# Patient Record
Sex: Female | Born: 1940
Health system: Southern US, Community
[De-identification: ages and names within clinical notes are randomized; demographics above are authoritative.]

## PROBLEM LIST (undated history)

## (undated) DIAGNOSIS — I10 Essential (primary) hypertension: Secondary | ICD-10-CM

## (undated) DIAGNOSIS — R21 Rash and other nonspecific skin eruption: Secondary | ICD-10-CM

## (undated) DIAGNOSIS — N3281 Overactive bladder: Secondary | ICD-10-CM

## (undated) DIAGNOSIS — J42 Unspecified chronic bronchitis: Secondary | ICD-10-CM

## (undated) DIAGNOSIS — K8020 Calculus of gallbladder without cholecystitis without obstruction: Secondary | ICD-10-CM

## (undated) DIAGNOSIS — Z973 Presence of spectacles and contact lenses: Secondary | ICD-10-CM

## (undated) DIAGNOSIS — D492 Neoplasm of unspecified behavior of bone, soft tissue, and skin: Secondary | ICD-10-CM

## (undated) DIAGNOSIS — H5210 Myopia, unspecified eye: Secondary | ICD-10-CM

## (undated) DIAGNOSIS — Z9289 Personal history of other medical treatment: Secondary | ICD-10-CM

## (undated) DIAGNOSIS — I219 Acute myocardial infarction, unspecified: Secondary | ICD-10-CM

## (undated) DIAGNOSIS — R42 Dizziness and giddiness: Secondary | ICD-10-CM

## (undated) DIAGNOSIS — R0600 Dyspnea, unspecified: Secondary | ICD-10-CM

## (undated) DIAGNOSIS — T7840XA Allergy, unspecified, initial encounter: Secondary | ICD-10-CM

## (undated) DIAGNOSIS — Z972 Presence of dental prosthetic device (complete) (partial): Secondary | ICD-10-CM

## (undated) DIAGNOSIS — R0902 Hypoxemia: Secondary | ICD-10-CM

## (undated) DIAGNOSIS — E669 Obesity, unspecified: Secondary | ICD-10-CM

## (undated) DIAGNOSIS — K08109 Complete loss of teeth, unspecified cause, unspecified class: Secondary | ICD-10-CM

## (undated) DIAGNOSIS — G4734 Idiopathic sleep related nonobstructive alveolar hypoventilation: Secondary | ICD-10-CM

## (undated) DIAGNOSIS — E785 Hyperlipidemia, unspecified: Secondary | ICD-10-CM

## (undated) DIAGNOSIS — R609 Edema, unspecified: Secondary | ICD-10-CM

## (undated) DIAGNOSIS — I2510 Atherosclerotic heart disease of native coronary artery without angina pectoris: Secondary | ICD-10-CM

## (undated) DIAGNOSIS — M1990 Unspecified osteoarthritis, unspecified site: Secondary | ICD-10-CM

## (undated) DIAGNOSIS — K219 Gastro-esophageal reflux disease without esophagitis: Secondary | ICD-10-CM

## (undated) HISTORY — DX: Unspecified osteoarthritis, unspecified site: M19.90

## (undated) HISTORY — PX: COLONOSCOPY: SHX174

## (undated) HISTORY — DX: Hyperlipidemia, unspecified: E78.5

## (undated) HISTORY — DX: Acute myocardial infarction, unspecified: I21.9

## (undated) HISTORY — DX: Calculus of gallbladder without cholecystitis without obstruction: K80.20

## (undated) HISTORY — PX: OTHER SURGICAL HISTORY: SHX169

## (undated) HISTORY — DX: Gastro-esophageal reflux disease without esophagitis: K21.9

## (undated) HISTORY — DX: Obesity, unspecified: E66.9

## (undated) HISTORY — DX: Dizziness and giddiness: R42

## (undated) HISTORY — DX: Allergy, unspecified, initial encounter: T78.40XA

## (undated) HISTORY — DX: Personal history of other medical treatment: Z92.89

## (undated) HISTORY — DX: Neoplasm of unspecified behavior of bone, soft tissue, and skin: D49.2

## (undated) HISTORY — DX: Overactive bladder: N32.81

## (undated) HISTORY — DX: Rash and other nonspecific skin eruption: R21

## (undated) HISTORY — DX: Myopia, unspecified eye: H52.10

## (undated) HISTORY — DX: Atherosclerotic heart disease of native coronary artery without angina pectoris: I25.10

## (undated) HISTORY — DX: Essential (primary) hypertension: I10

---

## 1982-07-27 HISTORY — PX: DILATION AND CURETTAGE OF UTERUS: SHX78

## 1990-07-27 HISTORY — PX: CHOLECYSTECTOMY: SHX55

## 2005-04-03 ENCOUNTER — Emergency Department (HOSPITAL_COMMUNITY): Admission: EM | Admit: 2005-04-03 | Discharge: 2005-04-03 | Payer: Self-pay | Admitting: Family Medicine

## 2005-05-12 ENCOUNTER — Ambulatory Visit: Payer: Self-pay | Admitting: Family Medicine

## 2005-07-22 ENCOUNTER — Encounter: Payer: Self-pay | Admitting: Family Medicine

## 2005-07-22 ENCOUNTER — Other Ambulatory Visit: Admission: RE | Admit: 2005-07-22 | Discharge: 2005-07-22 | Payer: Self-pay | Admitting: Family Medicine

## 2005-07-22 ENCOUNTER — Ambulatory Visit: Payer: Self-pay | Admitting: Family Medicine

## 2005-07-22 LAB — CONVERTED CEMR LAB: Pap Smear: NORMAL

## 2005-07-27 HISTORY — PX: JOINT REPLACEMENT: SHX530

## 2005-08-11 ENCOUNTER — Ambulatory Visit: Payer: Self-pay | Admitting: Family Medicine

## 2005-08-25 ENCOUNTER — Ambulatory Visit: Payer: Self-pay | Admitting: Family Medicine

## 2005-09-21 ENCOUNTER — Ambulatory Visit: Payer: Self-pay | Admitting: Family Medicine

## 2005-09-25 ENCOUNTER — Ambulatory Visit: Payer: Self-pay | Admitting: Family Medicine

## 2005-11-05 ENCOUNTER — Ambulatory Visit: Payer: Self-pay | Admitting: Family Medicine

## 2005-11-12 ENCOUNTER — Ambulatory Visit: Payer: Self-pay | Admitting: Family Medicine

## 2005-12-11 ENCOUNTER — Ambulatory Visit: Payer: Self-pay | Admitting: Family Medicine

## 2006-01-14 ENCOUNTER — Ambulatory Visit (HOSPITAL_COMMUNITY): Admission: RE | Admit: 2006-01-14 | Discharge: 2006-01-14 | Payer: Self-pay | Admitting: Urology

## 2006-02-10 ENCOUNTER — Ambulatory Visit: Payer: Self-pay | Admitting: Family Medicine

## 2006-02-22 ENCOUNTER — Other Ambulatory Visit: Payer: Self-pay

## 2006-03-17 ENCOUNTER — Inpatient Hospital Stay: Payer: Self-pay | Admitting: Specialist

## 2006-04-05 ENCOUNTER — Ambulatory Visit: Payer: Self-pay | Admitting: Family Medicine

## 2006-06-09 ENCOUNTER — Ambulatory Visit: Payer: Self-pay | Admitting: Family Medicine

## 2006-08-10 ENCOUNTER — Ambulatory Visit: Payer: Self-pay | Admitting: Family Medicine

## 2006-08-10 LAB — CONVERTED CEMR LAB
BUN: 14 mg/dL (ref 6–23)
Basophils Relative: 0.2 % (ref 0.0–1.0)
CO2: 27 meq/L (ref 19–32)
Calcium: 9.4 mg/dL (ref 8.4–10.5)
Creatinine, Ser: 0.7 mg/dL (ref 0.4–1.2)
Eosinophils Relative: 2.2 % (ref 0.0–5.0)
Hemoglobin: 13.7 g/dL (ref 12.0–15.0)
Lymphocytes Relative: 17.9 % (ref 12.0–46.0)
MCHC: 33.5 g/dL (ref 30.0–36.0)
MCV: 93.5 fL (ref 78.0–100.0)
Monocytes Relative: 6.9 % (ref 3.0–11.0)
Neutrophils Relative %: 72.8 % (ref 43.0–77.0)
Platelets: 314 10*3/uL (ref 150–400)
Potassium: 3.8 meq/L (ref 3.5–5.1)
RBC: 4.36 M/uL (ref 3.87–5.11)
RDW: 14.1 % (ref 11.5–14.6)
TSH: 1.08 microintl units/mL (ref 0.35–5.50)
Vitamin B-12: 405 pg/mL (ref 211–911)

## 2006-10-15 ENCOUNTER — Encounter: Payer: Self-pay | Admitting: Family Medicine

## 2006-10-15 DIAGNOSIS — J309 Allergic rhinitis, unspecified: Secondary | ICD-10-CM | POA: Insufficient documentation

## 2006-10-15 DIAGNOSIS — N318 Other neuromuscular dysfunction of bladder: Secondary | ICD-10-CM

## 2006-10-15 DIAGNOSIS — E785 Hyperlipidemia, unspecified: Secondary | ICD-10-CM

## 2006-10-15 DIAGNOSIS — E1169 Type 2 diabetes mellitus with other specified complication: Secondary | ICD-10-CM | POA: Insufficient documentation

## 2006-10-15 DIAGNOSIS — M1990 Unspecified osteoarthritis, unspecified site: Secondary | ICD-10-CM | POA: Insufficient documentation

## 2006-10-15 DIAGNOSIS — R32 Unspecified urinary incontinence: Secondary | ICD-10-CM

## 2006-10-15 DIAGNOSIS — J45991 Cough variant asthma: Secondary | ICD-10-CM

## 2006-10-15 DIAGNOSIS — I10 Essential (primary) hypertension: Secondary | ICD-10-CM

## 2007-03-03 ENCOUNTER — Ambulatory Visit: Payer: Self-pay | Admitting: Family Medicine

## 2007-03-03 ENCOUNTER — Telehealth (INDEPENDENT_AMBULATORY_CARE_PROVIDER_SITE_OTHER): Payer: Self-pay | Admitting: *Deleted

## 2007-03-03 DIAGNOSIS — H5210 Myopia, unspecified eye: Secondary | ICD-10-CM

## 2007-03-04 LAB — CONVERTED CEMR LAB
ALT: 21 units/L (ref 0–35)
HDL: 58 mg/dL (ref >39.0)
VLDL: 29 mg/dL (ref 0–40)

## 2007-04-11 ENCOUNTER — Telehealth (INDEPENDENT_AMBULATORY_CARE_PROVIDER_SITE_OTHER): Payer: Self-pay | Admitting: *Deleted

## 2007-04-11 ENCOUNTER — Ambulatory Visit: Payer: Self-pay | Admitting: Family Medicine

## 2007-04-13 LAB — CONVERTED CEMR LAB
ALT: 17 units/L (ref 0–35)
Direct LDL: 132.2 mg/dL
HDL: 40.8 mg/dL (ref >39.0)
VLDL: 40 mg/dL (ref 0–40)

## 2007-04-18 ENCOUNTER — Encounter: Payer: Self-pay | Admitting: Family Medicine

## 2007-04-26 ENCOUNTER — Encounter: Payer: Self-pay | Admitting: Family Medicine

## 2007-04-27 ENCOUNTER — Telehealth (INDEPENDENT_AMBULATORY_CARE_PROVIDER_SITE_OTHER): Payer: Self-pay | Admitting: *Deleted

## 2007-04-29 ENCOUNTER — Encounter (INDEPENDENT_AMBULATORY_CARE_PROVIDER_SITE_OTHER): Payer: Self-pay | Admitting: *Deleted

## 2007-05-31 ENCOUNTER — Ambulatory Visit: Payer: Self-pay | Admitting: Family Medicine

## 2007-08-13 ENCOUNTER — Encounter: Payer: Self-pay | Admitting: Family Medicine

## 2007-09-01 ENCOUNTER — Ambulatory Visit: Payer: Self-pay | Admitting: Family Medicine

## 2007-09-02 LAB — CONVERTED CEMR LAB
ALT: 18 units/L (ref 0–35)
AST: 16 units/L (ref 0–37)
Glucose, Bld: 98 mg/dL (ref 70–99)
Total CHOL/HDL Ratio: 4.9
Triglycerides: 208 mg/dL (ref 0–149)

## 2007-10-17 ENCOUNTER — Encounter: Payer: Self-pay | Admitting: Family Medicine

## 2008-03-16 ENCOUNTER — Telehealth: Payer: Self-pay | Admitting: Family Medicine

## 2008-03-29 ENCOUNTER — Encounter: Payer: Self-pay | Admitting: Family Medicine

## 2008-04-17 ENCOUNTER — Encounter: Payer: Self-pay | Admitting: Family Medicine

## 2008-04-26 ENCOUNTER — Encounter: Payer: Self-pay | Admitting: Family Medicine

## 2008-05-07 ENCOUNTER — Ambulatory Visit: Payer: Self-pay | Admitting: Family Medicine

## 2008-07-27 HISTORY — PX: TOTAL KNEE ARTHROPLASTY: SHX125

## 2008-08-03 ENCOUNTER — Ambulatory Visit: Payer: Self-pay | Admitting: Family Medicine

## 2008-08-14 ENCOUNTER — Encounter: Payer: Self-pay | Admitting: Family Medicine

## 2008-08-20 ENCOUNTER — Telehealth: Payer: Self-pay | Admitting: Family Medicine

## 2008-12-04 ENCOUNTER — Telehealth: Payer: Self-pay | Admitting: Family Medicine

## 2008-12-04 ENCOUNTER — Encounter: Payer: Self-pay | Admitting: Family Medicine

## 2008-12-11 ENCOUNTER — Encounter: Payer: Self-pay | Admitting: Family Medicine

## 2008-12-18 ENCOUNTER — Telehealth: Payer: Self-pay | Admitting: Family Medicine

## 2009-02-27 ENCOUNTER — Telehealth: Payer: Self-pay | Admitting: Family Medicine

## 2009-03-26 ENCOUNTER — Telehealth: Payer: Self-pay | Admitting: Family Medicine

## 2009-04-09 ENCOUNTER — Encounter: Payer: Self-pay | Admitting: Family Medicine

## 2009-06-10 ENCOUNTER — Ambulatory Visit: Payer: Self-pay | Admitting: Family Medicine

## 2009-07-27 ENCOUNTER — Ambulatory Visit: Payer: Self-pay | Admitting: Cardiology

## 2009-07-27 ENCOUNTER — Inpatient Hospital Stay (HOSPITAL_COMMUNITY): Admission: EM | Admit: 2009-07-27 | Discharge: 2009-07-31 | Payer: Self-pay | Admitting: Emergency Medicine

## 2009-07-27 HISTORY — PX: CORONARY ANGIOPLASTY WITH STENT PLACEMENT: SHX49

## 2009-07-29 ENCOUNTER — Encounter (INDEPENDENT_AMBULATORY_CARE_PROVIDER_SITE_OTHER): Payer: Self-pay | Admitting: Emergency Medicine

## 2009-08-07 DIAGNOSIS — I214 Non-ST elevation (NSTEMI) myocardial infarction: Secondary | ICD-10-CM

## 2009-08-08 ENCOUNTER — Ambulatory Visit: Payer: Self-pay | Admitting: Family Medicine

## 2009-08-19 ENCOUNTER — Ambulatory Visit: Payer: Self-pay | Admitting: Cardiovascular Disease

## 2009-08-19 DIAGNOSIS — I2510 Atherosclerotic heart disease of native coronary artery without angina pectoris: Secondary | ICD-10-CM | POA: Insufficient documentation

## 2009-08-27 ENCOUNTER — Encounter: Payer: Self-pay | Admitting: Cardiovascular Disease

## 2009-08-27 ENCOUNTER — Emergency Department (HOSPITAL_COMMUNITY): Admission: EM | Admit: 2009-08-27 | Discharge: 2009-08-27 | Payer: Self-pay | Admitting: Emergency Medicine

## 2009-09-02 ENCOUNTER — Ambulatory Visit: Payer: Self-pay | Admitting: Cardiovascular Disease

## 2009-09-16 ENCOUNTER — Encounter: Payer: Self-pay | Admitting: Cardiovascular Disease

## 2009-09-18 ENCOUNTER — Ambulatory Visit: Payer: Self-pay | Admitting: Family Medicine

## 2009-09-20 LAB — CONVERTED CEMR LAB
Albumin: 3.9 g/dL (ref 3.5–5.2)
Alkaline Phosphatase: 63 units/L (ref 39–117)
Basophils Absolute: 0 10*3/uL (ref 0.0–0.1)
CO2: 26 meq/L (ref 19–32)
Calcium: 9.8 mg/dL (ref 8.4–10.5)
Eosinophils Absolute: 0 10*3/uL (ref 0.0–0.7)
GFR calc non Af Amer: 58.33 mL/min (ref >60)
HCT: 40.6 % (ref 36.0–46.0)
HDL: 53.9 mg/dL (ref >39.00)
Hemoglobin: 13.6 g/dL (ref 12.0–15.0)
Lymphs Abs: 0.8 10*3/uL (ref 0.7–4.0)
Monocytes Relative: 1.6 % — ABNORMAL LOW (ref 3.0–12.0)
Neutro Abs: 16.8 10*3/uL — ABNORMAL HIGH (ref 1.4–7.7)
Potassium: 4.3 meq/L (ref 3.5–5.1)
RBC: 4.33 M/uL (ref 3.87–5.11)
Sodium: 142 meq/L (ref 135–145)
TSH: 0.59 microintl units/mL (ref 0.35–5.50)
Total CHOL/HDL Ratio: 2
Total Protein: 7.8 g/dL (ref 6.0–8.3)
VLDL: 17 mg/dL (ref 0.0–40.0)

## 2009-09-25 ENCOUNTER — Telehealth: Payer: Self-pay | Admitting: Family Medicine

## 2009-10-09 ENCOUNTER — Encounter: Payer: Self-pay | Admitting: Family Medicine

## 2009-10-10 ENCOUNTER — Ambulatory Visit: Payer: Self-pay | Admitting: Family Medicine

## 2009-10-10 LAB — CONVERTED CEMR LAB
Basophils Absolute: 0.1 10*3/uL (ref 0.0–0.1)
Eosinophils Absolute: 0.4 10*3/uL (ref 0.0–0.7)
Lymphocytes Relative: 18.7 % (ref 12.0–46.0)
MCHC: 33.5 g/dL (ref 30.0–36.0)
MCV: 91.1 fL (ref 78.0–100.0)
Neutro Abs: 7.4 10*3/uL (ref 1.4–7.7)
RDW: 14.8 % — ABNORMAL HIGH (ref 11.5–14.6)
WBC: 10.3 10*3/uL (ref 4.5–10.5)

## 2009-10-15 ENCOUNTER — Ambulatory Visit: Payer: Self-pay | Admitting: Family Medicine

## 2009-10-21 ENCOUNTER — Telehealth: Payer: Self-pay | Admitting: Family Medicine

## 2009-10-29 ENCOUNTER — Telehealth: Payer: Self-pay | Admitting: Family Medicine

## 2009-11-27 ENCOUNTER — Ambulatory Visit: Payer: Self-pay | Admitting: Family Medicine

## 2009-12-03 ENCOUNTER — Telehealth: Payer: Self-pay | Admitting: Cardiovascular Disease

## 2009-12-03 ENCOUNTER — Encounter: Payer: Self-pay | Admitting: Cardiovascular Disease

## 2009-12-05 ENCOUNTER — Encounter: Payer: Self-pay | Admitting: Family Medicine

## 2009-12-05 LAB — HM MAMMOGRAPHY: HM Mammogram: NORMAL

## 2009-12-12 ENCOUNTER — Encounter: Payer: Self-pay | Admitting: Family Medicine

## 2010-01-10 ENCOUNTER — Telehealth: Payer: Self-pay | Admitting: Cardiovascular Disease

## 2010-02-13 ENCOUNTER — Encounter: Payer: Self-pay | Admitting: Family Medicine

## 2010-02-25 ENCOUNTER — Telehealth: Payer: Self-pay | Admitting: Cardiovascular Disease

## 2010-03-18 ENCOUNTER — Ambulatory Visit: Payer: Self-pay | Admitting: Cardiovascular Disease

## 2010-05-05 ENCOUNTER — Telehealth: Payer: Self-pay | Admitting: Family Medicine

## 2010-05-20 ENCOUNTER — Ambulatory Visit: Payer: Self-pay | Admitting: Family Medicine

## 2010-05-20 LAB — CONVERTED CEMR LAB
ALT: 17 units/L (ref 0–35)
AST: 18 units/L (ref 0–37)
Albumin: 4 g/dL (ref 3.5–5.2)
Glucose, Bld: 111 mg/dL — ABNORMAL HIGH (ref 70–99)
HDL: 51.4 mg/dL (ref >39.00)
Phosphorus: 3.6 mg/dL (ref 2.3–4.6)
Potassium: 3.9 meq/L (ref 3.5–5.1)
Sodium: 142 meq/L (ref 135–145)
Total CHOL/HDL Ratio: 3

## 2010-05-27 ENCOUNTER — Ambulatory Visit: Payer: Self-pay | Admitting: Family Medicine

## 2010-05-27 DIAGNOSIS — R7309 Other abnormal glucose: Secondary | ICD-10-CM | POA: Insufficient documentation

## 2010-06-06 ENCOUNTER — Encounter: Payer: Self-pay | Admitting: Cardiovascular Disease

## 2010-06-24 ENCOUNTER — Encounter: Payer: Self-pay | Admitting: Family Medicine

## 2010-06-26 ENCOUNTER — Encounter: Payer: Self-pay | Admitting: Cardiovascular Disease

## 2010-06-26 ENCOUNTER — Telehealth: Payer: Self-pay | Admitting: Cardiovascular Disease

## 2010-08-07 ENCOUNTER — Ambulatory Visit
Admission: RE | Admit: 2010-08-07 | Discharge: 2010-08-07 | Payer: Self-pay | Source: Home / Self Care | Attending: Cardiovascular Disease | Admitting: Cardiovascular Disease

## 2010-08-07 ENCOUNTER — Encounter: Payer: Self-pay | Admitting: Cardiovascular Disease

## 2010-08-07 ENCOUNTER — Telehealth: Payer: Self-pay | Admitting: Cardiovascular Disease

## 2010-08-11 ENCOUNTER — Telehealth (INDEPENDENT_AMBULATORY_CARE_PROVIDER_SITE_OTHER): Payer: Self-pay | Admitting: *Deleted

## 2010-08-11 ENCOUNTER — Ambulatory Visit: Payer: Self-pay | Admitting: Specialist

## 2010-08-17 ENCOUNTER — Encounter: Payer: Self-pay | Admitting: Otolaryngology

## 2010-08-19 ENCOUNTER — Inpatient Hospital Stay: Payer: Self-pay | Admitting: Specialist

## 2010-08-26 NOTE — Assessment & Plan Note (Signed)
Summary: RASH UNDER BREATS/JRR   Vital Signs:  Patient profile:   70 year old female Height:      66 inches Weight:      218.75 pounds BMI:     35.43 Temp:     97.9 degrees F oral Pulse rate:   76 / minute Pulse rhythm:   regular BP sitting:   110 / 68  (left arm) Cuff size:   large  Vitals Entered By: Lewanda Rife LPN (October 15, 2009 9:06 AM) CC: Rash under breast for 1 1/2 weeks   History of Present Illness: has rash under breasts that is itchy- cannot get rid of it almost 2 wk used benadryl cream , zinc oxide   ? anti fungal for jock itch -- ? unsure-- that helped a little with itch   no illness of fever   no rash under arms some rash under belly   Allergies: 1)  ! Lipitor 2)  ! Zocor 3)  ! 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 No CAD  Social History: Last updated: 08/19/2009 She quit tobacco about 20 years ago.  Occasional  alcohol.   Denies illicit drug use.  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 Eyes:  Denies blurring and eye pain. CV:  Denies chest pain or discomfort and palpitations. Resp:  Denies cough and wheezing. MS:  Denies joint pain. Derm:  Complains of itching, lesion(s), and rash; denies poor wound healing. Neuro:  Denies numbness. Heme:  Denies abnormal bruising.  Physical Exam  General:  overweight but generally well appearing  Head:  normocephalic, atraumatic, and no abnormalities observed.   Neck:  No deformities, masses, or tenderness noted. Lungs:  Normal respiratory effort, chest expands symmetrically. Lungs are clear to auscultation, no crackles or wheezes. Heart:  Normal rate and regular rhythm. S1 and S2 normal without gallop, murmur, click, rub or other extra sounds. Abdomen:  no suprapubic tenderness or fullness felt  Skin:  erythematous rash under breasts and under pannus with satellite lesions no skin breakdown resembles yeast  Cervical Nodes:  No lymphadenopathy noted Inguinal Nodes:  No significant adenopathy Psych:  normal affect, talkative and pleasant    Impression & Recommendations:  Problem # 1:  INTERTRIGO, CANDIDAL (ICD-695.89) Assessment New under breasts and pannus  recommended keeping clean and dry - out of pool until healed  nystatin two times a day  then antifungal powder otc as needed -- (wt loss may help ) update 1 week if not imp   Complete Medication List: 1)  Amlodipine Besylate 10 Mg Tabs (Amlodipine besylate) .Marland Kitchen.. 1 by mouth everyday 2)  Bl Vitamin C 1000 Mg Tabs (Ascorbic acid) .... One by mouth everyday 3)  Calcium-vitamin D 500-200 Mg-unit Tabs (Calcium  carbonate-vitamin d) .Marland Kitchen.. 1 tab once daily 4)  Chlortrimaton Otc  .... Take one by mouth two times a day as needed 5)  Ra Flax Seed Oil 1000 1000 Mg Caps (Flaxseed (linseed)) .... One by mouth daily 6)  Nasonex 50 Mcg/act Susp (Mometasone furoate) .... 2 sprays in each nostril once daily 7)  Vesicare 10 Mg Tabs (Solifenacin  succinate) .... Take one by mouth daily 8)  Plavix 75 Mg Tabs (Clopidogrel bisulfate) .... Take one by mouth daily 9)  Pataday 0.2 % Soln (Olopatadine hcl) .... Use one drop both eyes daily 10)  Metoprolol Tartrate 25 Mg Tabs (Metoprolol tartrate) .... Take one by mouth two times a day 11)  Aspirin 325 Mg Tabs (Aspirin) .... Take one by mouth daily 12)  Crestor 20 Mg Tabs (Rosuvastatin calcium) .... Take one by mouth daily 13)  Nitrostat 0.4 Mg Subl (Nitroglycerin) .... Take sl as directed as needed 14)  Nystatin 100000 Unit/gm Crea (Nystatin) .... Apply to affected area two times a day as needed rash - yeast  Patient Instructions: 1)  keep your rash as cool and dry as possible  2)  use nystatin cream two times a day until clear  3)  when clear - you can start using any over the counter antifungal powder to keep it dry  4)  update me in 5-7 days if not improved  Prescriptions: NYSTATIN 100000 UNIT/GM CREA (NYSTATIN) apply to affected area two times a day as needed rash - yeast  #1 medium x 1   Entered and Authorized by:   Judith Part MD   Signed by:   Judith Part MD on 10/15/2009   Method used:   Electronically to        [id number]       [address]       [place], [place]  [address]       Ph: [phone]       Fax: [phone]   RxID:   [id number]   Current Allergies (reviewed today): ! LIPITOR ! ZOCOR ! * FLONASE ASA * MAVIK

## 2010-08-26 NOTE — Assessment & Plan Note (Signed)
Summary: 3 M F/U DLO   Vital Signs:  Patient profile:   70 year old female Height:      66 inches Weight:      221.25 pounds BMI:     35.84 Temp:     98.1 degrees F oral Pulse rate:   76 / minute Pulse rhythm:   regular BP sitting:   120 / 68  (left arm) Cuff size:   large  Vitals Entered By: Lewanda Rife LPN (Nov 28, 7251 11:12 AM) CC: three  month f/u   History of Present Illness: here for f/u 3 mo of HTN and lipids  has been feeling good overall --much better than she had   L knee is still bothering her  has to wait a year before she can have knee done due to plavix  cortisone shot helped and synervisc   wt is up 4 lb with bmi 35  HTN well controlled with bp 120/68 today   Last Lipid ProfileCholesterol: 124 (09/18/2009 9:11:08 AM)HDL:  53.90 (09/18/2009 9:11:08 AM)LDL:  53 (09/18/2009 9:11:08 AM)Triglycerides:  Last Liver profileSGOT:  19 (09/18/2009 9:11:08 AM)SPGT:  18 (09/18/2009 9:11:08 AM)T. Bili:  0.4 (09/18/2009 9:11:08 AM)Alk Phos:  63 (09/18/2009 9:11:08 AM)  good control on crestor -- excellent   [facility]  her husband is going with her   mam - needs to sched no lumps on self exam    rash under breasts- uses lotrisone -- is helpful with powder and lotrisone     Allergies: 1)  ! Lipitor 2)  ! Zocor 3)  ! * Flonase 4)  ! 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 No CAD  Social History: Last updated: 08/19/2009 She quit tobacco about 20 years ago.  Occasional  alcohol.   Denies illicit drug use.  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 Eyes:  Denies blurring and eye irritation. CV:  Denies chest pain or discomfort and lightheadness. Resp:  Denies cough, shortness of breath, and wheezing. GI:  Denies abdominal pain, bloody stools, indigestion, and nausea. MS:  Denies muscle aches. Derm:  Denies itching, lesion(s), poor wound healing, and rash. Neuro:  Denies numbness and tingling. Psych:  mood is ok. Endo:  Denies cold intolerance, excessive thirst, excessive urination, and heat intolerance. Heme:  Denies abnormal bruising, bleeding, enlarge lymph nodes, and fevers.  Physical Exam  General:  obese and well appearing  Head:  normocephalic, atraumatic, and no abnormalities observed.   Eyes:  vision grossly intact, pupils equal, pupils round, and pupils reactive to light.  no conjunctival pallor, injection or icterus  Mouth:  pharynx pink and moist.   Neck:  supple with full rom and no masses or thyromegally, no JVD or  carotid bruit  Chest Wall:  No deformities, masses, or tenderness noted. Lungs:  Normal respiratory effort, chest expands symmetrically. Lungs are clear to auscultation, no crackles or wheezes. Heart:  Normal rate and regular rhythm. S1 and S2 normal without gallop, murmur, click, rub or other extra sounds. Abdomen:  Bowel sounds positive,abdomen soft and non-tender without masses, organomegaly or hernias noted. no  renal bruits  Msk:  No deformity or scoliosis noted of thoracic or lumbar spine.  poor rom knee Pulses:  R and L carotid,radial,femoral,dorsalis pedis and posterior tibial pulses are full and equal bilaterally Extremities:  No clubbing, cyanosis, edema, or deformity noted with normal full range of motion of all joints.   Neurologic:  sensation intact to light touch, gait normal, and DTRs symmetrical and normal.   Skin:  few areas of redness lower abd 1 cm /scaley with central clearing  rash under breasts is almost gone Cervical Nodes:  No lymphadenopathy noted Inguinal Nodes:  No significant adenopathy Psych:  normal affect, talkative and pleasant    Impression & Recommendations:  Problem # 1:  INTERTRIGO, CANDIDAL (ICD-695.89) Assessment Improved better with lotrisone and daily antifungal powder urged to keep areas clean and dry  Problem # 2:  LEUKOCYTOSIS (ICD-288.60) Assessment: Improved this was due to cortisone shot and is resolved  rev last labs with pt   Problem # 3:  HYPERLIPIDEMIA (ICD-272.4) Assessment: Improved  very well controlled with crestor will continue that and good diet lab and f/u 6 mo rev lab in detail today Her updated medication list for this problem includes:    Crestor 20 Mg Tabs (Rosuvastatin calcium) .Marland Kitchen... Take one by mouth daily  Labs Reviewed: SGOT: 19 (09/18/2009)   SGPT: 18 (09/18/2009)   HDL:53.90 (09/18/2009), 40.6 (09/01/2007)  LDL:53 (09/18/2009), DEL (03/00/9233)  Chol:124 (09/18/2009), 197 (09/01/2007)  Trig:85.0 (09/18/2009), 208 (09/01/2007)  Problem # 4:  HYPERTENSION (ICD-401.9) Assessment: Unchanged very good control with current meds - no problems urged to keep up exercise f/u 6 m  Her updated medication list for this problem includes:    Amlodipine Besylate 10 Mg Tabs (Amlodipine besylate) .Marland Kitchen... 1 by mouth everyday    Metoprolol Tartrate 25 Mg Tabs (Metoprolol tartrate) .Marland Kitchen... Take one by mouth two times a day  BP today:  120/68 Prior BP: 110/68 (10/15/2009)  Labs Reviewed: K+: 4.3 (09/18/2009) Creat: : 1.0 (09/18/2009)   Chol: 124 (09/18/2009)   HDL: 53.90 (09/18/2009)   LDL: 53 (09/18/2009)   TG: 85.0 (09/18/2009)  Problem # 5:  OTHER SCREENING MAMMOGRAM (ICD-V76.12) Assessment: Comment Only schedule annual mammogram Orders: Radiology Referral (Radiology)  Complete Medication List: 1)  Amlodipine Besylate 10 Mg Tabs (Amlodipine besylate) .Marland Kitchen.. 1 by mouth everyday 2)  Bl Vitamin C 1000 Mg Tabs (Ascorbic acid) .... One by mouth everyday 3)  Calcium-vitamin D 500-200 Mg-unit Tabs (Calcium carbonate-vitamin d) .Marland Kitchen.. 1 tab once daily 4)  Chlortrimaton Otc  .... Take one by mouth two times a day as needed 5)  Ra Flax Seed Oil 1000 1000 Mg Caps (Flaxseed (linseed)) .... One by mouth daily 6)  Nasonex 50 Mcg/act Susp (Mometasone furoate) .... 2 sprays in each nostril once daily 7)  Vesicare 10 Mg Tabs (Solifenacin succinate) .... Take one by mouth daily 8)  Plavix 75 Mg Tabs (Clopidogrel bisulfate) .... Take one by mouth daily 9)  Pataday 0.2 % Soln (Olopatadine hcl) .... Use one drop both eyes daily 10)  Metoprolol Tartrate 25 Mg Tabs (Metoprolol tartrate) .... Take one by mouth two times  a day 11)  Aspirin 325 Mg Tabs (Aspirin) .... Take one by mouth daily 12)  Crestor 20 Mg Tabs (Rosuvastatin calcium) .... Take one by mouth daily 13)  Nitrostat 0.4 Mg Subl (Nitroglycerin) .... Take sl as directed as needed 14)  Lotrisone 1-0.05 % Crea (Clotrimazole-betamethasone) .... Apply to aff area two times a day  Other Orders: Pneumococcal Vaccine (21308) Admin 1st Vaccine (65784) Admin 1st Vaccine [facility]) [phone])  Patient Instructions: 1)  pneumonia vaccine today  2)  schedule fasting labs and then follow up in 6 months lipid/ast/alt/reanal 272 3)  we will schedule mammogram at check out   Current Allergies (reviewed today): ! LIPITOR ! ZOCOR ! * FLONASE ! * FISH ASA * MAVIK     Pneumovax Vaccine     Vaccine Type: Pneumovax    Site: left deltoid    Mfr: Merck    Dose: 0.5 ml    Route: IM    Given by: Lewanda Rife LPN    Exp. Date: 05/21/2011    Lot #: [id number]    VIS given: 02/22/96 version given Nov 27, 2009.

## 2010-08-26 NOTE — Assessment & Plan Note (Signed)
Summary: eph/pt was seen in er   Visit Type:  EPH Primary Provider:  Judith Part MD  CC:  pt was seen in ED last Tuesday for cp.  History of Present Illness: 70 yo female with h/o HTN, OA and morbid obesity recently admitted to [facility] from 07/27/09 to 07/31/09 with NSTEMI. She had a cardiac cath on 07/29/09 showing severe two vessel disease and we placed Promus drug eluting stents in the LAD and the RCA. She did well folllowing discharge and was seen here in our office two weeks ago. Since then, she had an episode of chest pain and was seen in the ED at [facility] 08/27/09.  She was at the dentist with her husband and had the onset of pressure in her chest that was mild. She took a NTG and then felt dizzy. She then went to the ED where she had normal POC cardiac enzymes. She was not admitted. EKG reportedly without ischemic changes. She is here today for follow up. She has had no recurrence of pain. Overall energy level is down but unchanged. She has been belching alot and thinks she may have some acid reflux.   Current Medications (verified): 1)  Amlodipine Besylate 10 Mg  Tabs (Amlodipine Besylate) .Marland Kitchen.. 1 By Mouth Qd 2)  Bl Vitamin C 1000 Mg  Tabs (Ascorbic Acid) .... One By Mouth Qd 3)  Calcium-Vitamin D 500-200 Mg-Unit Tabs (Calcium Carbonate-Vitamin D) .Marland Kitchen.. 1 Tab Once Daily 4)  Chlortrimaton Otc .... Take One By Mouth Two Times A Day As Needed 5)  Ra Flax Seed Oil 1000 1000 Mg Caps (Flaxseed (Linseed)) .... One By Mouth Daily 6)  Nasonex 50 Mcg/act Susp (Mometasone Furoate) .... 2 Sprays in Each Nostril Once Daily 7)  Vesicare 10 Mg Tabs (Solifenacin Succinate) .... Take One By Mouth Daily 8)  Plavix 75 Mg Tabs (Clopidogrel Bisulfate) .... Take One By Mouth Daily 9)  Pataday 0.2 % Soln (Olopatadine Hcl) .... Use One Drop Both Eyes Daily 10)  Metoprolol Tartrate 25 Mg Tabs (Metoprolol Tartrate) .... Take One By Mouth Two Times A Day 11)  Aspirin 325 Mg Tabs (Aspirin) .... Take One By  Mouth Daily 12)  Crestor 20 Mg Tabs (Rosuvastatin Calcium) .... Take One By Mouth Daily 13)  Nitrostat 0.4 Mg Subl (Nitroglycerin) .... Take Sl As Directed As Needed  Allergies: 1)  ! Lipitor 2)  ! Zocor 3)  ! * Flonase 4)  Asa 5)  * Mavik  Past History:  Past Medical History: Reviewed history from 08/07/2009 and no changes required. 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  The patient denies malaise, fever, weight gain/loss, vision loss, decreased hearing, hoarseness, palpitations, shortness of breath, prolonged cough, wheezing, sleep apnea, coughing up blood, abdominal pain, blood in stool, nausea, vomiting, diarrhea, heartburn, incontinence, blood in urine, muscle weakness, joint pain, leg swelling, rash, skin lesions, headache, fainting, dizziness, depression, anxiety, enlarged lymph nodes, easy bruising or bleeding, and environmental allergies.    Vital Signs:  Patient profile:   70 year old female Height:      66 inches Weight:  222 pounds BMI:     35.96 Pulse rate:   76 / minute Pulse rhythm:   regular BP sitting:   130 / 76  (right arm) Cuff size:   large  Vitals Entered By: Danielle Rankin, CMA (September 02, 2009 11:15 AM)  Physical Exam  General:  General: Well developed, well nourished, NAD Psychiatric: Mood and affect normal Neck: No JVD, no carotid bruits, no thyromegaly, no  lymphadenopathy. Lungs:Clear bilaterally, no wheezes, rhonci, crackles CV: RRR no murmurs, gallops rubs Abdomen: soft, NT, ND, BS present Extremities: No edema, pulses 2+.    EKG  Procedure date:  09/02/2009  Findings:      Sinus rhythm, rate 73 bpm with PACs  Impression & Recommendations:  Problem # 1:  CAD, NATIVE VESSEL (ICD-414.01) Stable. I do not think her recent chest pain was cardiac. Continue current cardiac meds. I have asked her to start Zantac or Pepcid for GERD. She will avoid PPIs since she is on Plavix.   Her updated medication list for this problem includes:    Amlodipine Besylate 10 Mg Tabs (Amlodipine besylate) .Marland Kitchen... 1 by mouth qd    Plavix 75 Mg Tabs (Clopidogrel bisulfate) .Marland Kitchen... Take one by mouth daily    Metoprolol Tartrate 25 Mg Tabs (Metoprolol tartrate) .Marland Kitchen... Take one by mouth two times a day    Aspirin 325 Mg Tabs (Aspirin) .Marland Kitchen... Take one by mouth daily    Nitrostat 0.4 Mg Subl (Nitroglycerin) .Marland Kitchen... Take sl as directed as needed  Problem # 2:  HYPERTENSION (ICD-401.9) Well controlled.  Continue current therapy.   Her updated medication list for this problem includes:    Amlodipine Besylate 10 Mg Tabs (Amlodipine besylate) .Marland Kitchen... 1 by mouth qd    Metoprolol Tartrate 25 Mg Tabs (Metoprolol tartrate) .Marland Kitchen... Take one by mouth two times a day    Aspirin 325 Mg Tabs (Aspirin) .Marland Kitchen... Take one by mouth daily  Patient Instructions: 1)  Your physician recommends that you schedule a follow-up appointment in: 6 months

## 2010-08-26 NOTE — Letter (Signed)
[facility]   Imported By: Roderic Ovens 09/05/2009 13:48:44  _____________________________________________________________________  External Attachment:    Type:   Image     Comment:   External Document

## 2010-08-26 NOTE — Miscellaneous (Signed)
Summary: Orders Update  Clinical Lists Changes  Medications: Changed medication from METOPROLOL TARTRATE 25 MG TABS (METOPROLOL TARTRATE) take one by mouth two times a day to METOPROLOL TARTRATE 25 MG TABS (METOPROLOL TARTRATE) take one by mouth two times a day - Signed Rx of METOPROLOL TARTRATE 25 MG TABS (METOPROLOL TARTRATE) take one by mouth two times a day;  #60 x 6;  Signed;  Entered by: Ollen Gross, RN, BSN;  Authorized by: Verne Carrow, MD;  Method used: Electronically to [facility] [address], [place], [place], [place]  [address], Ph: [phone], Fax: [phone]    Prescriptions: METOPROLOL TARTRATE 25 MG TABS (METOPROLOL TARTRATE) take one by mouth two times a day  #60 x 6   Entered by:   Ollen Gross, RN, BSN   Authorized by:   Verne Carrow, MD   Signed by:   Ollen Gross, RN, BSN on 12/03/2009   Method used:   Electronically to        [facility] [address] (retail)       [address], [address]       [place], [place]  [address]       Ph: [phone]       Fax: [phone]   RxID:   [id number]

## 2010-08-26 NOTE — Miscellaneous (Signed)
Summary: [facility]  [facility]   Imported By: Maryln Gottron 10/14/2009 12:18:23  _____________________________________________________________________  External Attachment:    Type:   Image     Comment:   External Document

## 2010-08-26 NOTE — Miscellaneous (Signed)
Summary: flu vaccine  Clinical Lists Changes  Observations: Added new observation of FLU VAX: Historical received at [facility] (05/15/2010 11:42)      Immunization History:  Influenza Immunization History:    Influenza:  historical received at [facility] (05/15/2010)

## 2010-08-26 NOTE — Progress Notes (Signed)
Summary: wbc elevated   Phone Note Call from Patient Call back at [phone]   Caller: Patient Call For: Judith Part MD Summary of Call: Patient says that when she was in for her labs her wbc was elevated. She wanted to let you know that she thinks the reason is because she had a cortizone shot in her knee the day before the labs. Please advise. Initial call taken by: Melody Comas,  September 25, 2009 11:31 AM  Follow-up for Phone Call        thanks for the info-- that could absolutely play a role please schedule re check of cbc with diff in about 2 weeks for leukocytosis and hopefully it will be down  still- update me if fever or any other symptoms- thanks  Follow-up by: Judith Part MD,  September 25, 2009 11:44 AM  Additional Follow-up for Phone Call Additional follow up Details #1::        Patient notified as instructed by telephone. lab appointment scheduled as instructed  10/10/09 at 8:45am. Lewanda Rife LPN  September 26, 2723 5:15 PM

## 2010-08-26 NOTE — Letter (Signed)
Summary: Gwenlyn Fudge MD  Gwenlyn Fudge MD   Imported By: Lanelle Bal 06/26/2010 13:47:29  _____________________________________________________________________  External Attachment:    Type:   Image     Comment:   External Document

## 2010-08-26 NOTE — Letter (Signed)
Summary: [facility]  [facility]   Imported By: Lanelle Bal 02/19/2010 11:35:30  _____________________________________________________________________  External Attachment:    Type:   Image     Comment:   External Document

## 2010-08-26 NOTE — Letter (Signed)
Summary: Generic Letter  Architectural technologist, Main Office  [address]   [place], [place] [address]   Phone: [phone]  Fax: [phone]    06/26/2010  [facility] [address] [place], [place]  [address]  cc: Gwenlyn Fudge  Dear Dr. Katrinka Blazing,  Ms. Kelli Velasquez had 2 drug eluting coronary stents placed on July 29, 2009. She will require one year of dual antiplatelet therapy with aspirin and Plavix before any elective surgery. I would recommend that her knee surgery be postponed until after July 29, 2010 based on current guidelines. At that time, it should be safe to stop her Plavix but continue her ASA if possible during the period around her knee surgery. Please call me with questions.     Sincerely,   Verne Carrow, MD

## 2010-08-26 NOTE — Miscellaneous (Signed)
Summary: mammogram screening  Clinical Lists Changes  Observations: Added new observation of MAMMO DUE: 11/2010 (12/12/2009 12:58) Added new observation of MAMMOGRAM: normal (12/05/2009 12:58)      Preventive Care Screening  Mammogram:    Date:  12/05/2009    Next Due:  11/2010    Results:  normal

## 2010-08-26 NOTE — Letter (Signed)
Summary: Results Follow up Letter  [place] [facility]  [address] [place], [place] [address]   Phone: [phone]  Fax: [phone]    12/12/2009 MRN: [id number]   [facility] [address] [place], [place]  [address]    Dear Ms. Friend,  The following are the results of your recent test(s):  Test         Result    Pap Smear:        Normal _____  Not Normal _____ Comments: ______________________________________________________ Cholesterol: LDL(Bad cholesterol):         Your goal is less than:         HDL (Good cholesterol):       Your goal is more than: Comments:  ______________________________________________________ Mammogram:        Normal __X___  Not Normal _____ Comments:Please repeat mammogram in one year.  ___________________________________________________________________ Hemoccult:        Normal _____  Not normal _______ Comments:    _____________________________________________________________________ Other Tests:    We routinely do not discuss normal results over the telephone.  If you desire a copy of the results, or you have any questions about this information we can discuss them at your next office visit.   Sincerely,    Idamae Schuller Tower,MD  MT/ri

## 2010-08-26 NOTE — Progress Notes (Signed)
Summary: Pt update rash under breast (worse)  Phone Note Call from Patient Call back at [phone] cell   Caller: Patient Call For: Judith Part MD Summary of Call: Pt saw Dr Milinda Antis 10/15/09 re rash under breast. Pt trying to keep area dry but pt says under breast stays damp even trying to dry with towel and using a fan also.  The rash has spread more under the breast and also a new area on upper chest and on arm. The itching is worse also. Pt using Benadryl cream on the two new areas to help control itching. Pt still using Nystatin. Pt uses [facility] if pharmacy is needed. Please advise.  Initial call taken by: Lewanda Rife LPN,  October 21, 2009 10:01 AM  Follow-up for Phone Call        lets switch to different cream called lotrisone it has different antifungal and also a little cortisone for itch let me know if not imp over the week  keep areas as dry as possible as you are doing px written on EMR for call in  Follow-up by: Judith Part MD,  October 21, 2009 10:11 AM  Additional Follow-up for Phone Call Additional follow up Details #1::        Patient notified as instructed by telephone. Medication [facility]oned to [facility] pharmacy as instructed. Lewanda Rife LPN  October 21, 2009 10:43 AM     New/Updated Medications: LOTRISONE 1-0.05 % CREA (CLOTRIMAZOLE-BETAMETHASONE) apply to aff area two times a day Prescriptions: LOTRISONE 1-0.05 % CREA (CLOTRIMAZOLE-BETAMETHASONE) apply to aff area two times a day  #1 medium x 1   Entered and Authorized by:   Judith Part MD   Signed by:   Lewanda Rife LPN on 56/21/3086   Method used:   Telephoned to ...         RxID:   [id number]

## 2010-08-26 NOTE — Assessment & Plan Note (Signed)
Summary: 6 month follow up/414.01/pla   Visit Type:  6 mo f/u Primary Eulene Pekar:  Judith Part MD  CC:  No chest pain.Marland Kitchen  History of Present Illness: 70 yo female with h/o HTN, OA and morbid obesity admitted to [facility] from 07/27/09 to 07/31/09 with NSTEMI. She had a cardiac cath on 07/29/09 showing severe two vessel disease and we placed Promus drug eluting stents in the LAD and the RCA. She did well folllowing discharge and is here today for planned caridac follow up. She has had no chest pain or SOB. She has been doing well. She has been exercising every day in water aerobics.   Current Medications (verified): 1)  Amlodipine Besylate 10 Mg  Tabs (Amlodipine Besylate) .Marland Kitchen.. 1 By Mouth Everyday 2)  Bl Vitamin C 1000 Mg  Tabs (Ascorbic Acid) .... One By Mouth Everyday 3)  Calcium-Vitamin D 500-200 Mg-Unit Tabs (Calcium Carbonate-Vitamin D) .Marland Kitchen.. 1 Tab Once Daily 4)  Chlortrimaton Otc .... Take One By Mouth Two Times A Day As Needed 5)  Ra Flax Seed Oil 1000 1000 Mg Caps (Flaxseed (Linseed)) .... One By Mouth Daily 6)  Nasonex 50 Mcg/act Susp (Mometasone Furoate) .... 2 Sprays in Each Nostril Once Daily 7)  Vesicare 10 Mg Tabs (Solifenacin Succinate) .... Take One By Mouth Daily 8)  Plavix 75 Mg Tabs (Clopidogrel Bisulfate) .... Take One By Mouth Daily 9)  Metoprolol Tartrate 25 Mg Tabs (Metoprolol Tartrate) .... Take One By Mouth Two Times A Day 10)  Aspirin 325 Mg Tabs (Aspirin) .... Take One By Mouth Daily 11)  Crestor 20 Mg Tabs (Rosuvastatin Calcium) .... Take One By Mouth Daily 12)  Nitrostat 0.4 Mg Subl (Nitroglycerin) .... Take Sl As Directed As Needed 13)  Celebrex 200 Mg Caps (Celecoxib) .Marland Kitchen.. 1 Cap Once Daily  Allergies: 1)  ! Lipitor 2)  ! Zocor 3)  ! * Flonase 4)  ! * Fish 5)  Asa 6)  * Mavik  Past History:  Past Medical History: Reviewed history from 08/07/2009 and no changes required. 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 She quit tobacco about 20 years ago.  Occasional  alcohol.   Denies illicit drug use.  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    Vital Signs:  Patient profile:   70 year old female Height:      66 inches Weight:      220.12 pounds BMI:     35.66 Pulse rate:   68 / minute Pulse rhythm:   regular BP sitting:   126 / 80  (left arm) Cuff size:   large  Vitals Entered By: Danielle Rankin, CMA (March 18, 2010 8:42 AM)  Physical Exam  General:  General: Well developed, well nourished, NAD Musculoskeletal: Muscle strength 5/5 all ext Psychiatric: Mood and affect normal Neck: No JVD, no carotid bruits, no thyromegaly, no lymphadenopathy. Lungs:Clear bilaterally, no wheezes, rhonci, crackles CV: RRR no murmurs, gallops rubs Abdomen: soft, NT, ND, BS present Extremities: No  edema, pulses 2+.    Impression & Recommendations:  Problem # 1:  CAD, NATIVE VESSEL (ICD-414.01) Stable. No signs or symptoms of angina.   Will continue ASA and Plavix for at least one year post stent (DES placed january 2011). Will decrease ASA to 81 mg per day. Continue beta blocker and statin. Lipids checked February 2011 and well controlled (LDL 53, HDL 54, Total chol 124).   Her updated medication list for this problem includes:    Amlodipine Besylate 10 Mg Tabs (Amlodipine besylate) .Marland Kitchen... 1 by mouth everyday    Plavix 75 Mg Tabs (Clopidogrel bisulfate) .Marland Kitchen... Take one by mouth daily    Metoprolol Tartrate 25 Mg Tabs (Metoprolol tartrate) .Marland Kitchen... Take one by mouth two times a day    Aspirin 325 Mg Tabs (Aspirin) .Marland Kitchen... Take one by mouth daily    Nitrostat 0.4 Mg Subl (Nitroglycerin) .Marland Kitchen... Take sl as directed as needed  Her updated medication list for this problem includes:    Amlodipine Besylate 10 Mg Tabs (Amlodipine besylate) .Marland Kitchen... 1 by mouth everyday    Plavix 75 Mg Tabs (Clopidogrel bisulfate) .Marland Kitchen... Take one by mouth daily    Metoprolol Tartrate 25 Mg Tabs (Metoprolol tartrate) .Marland Kitchen... Take one by mouth two times a day    Aspirin 325 Mg Tabs (Aspirin) .Marland Kitchen... Take one by mouth daily    Nitrostat 0.4 Mg Subl (Nitroglycerin) .Marland Kitchen... Take sl as directed as needed  Problem # 2:  HYPERTENSION (ICD-401.9)  BP well controlled. No changes.   Her updated medication list for this problem includes:    Amlodipine Besylate 10 Mg Tabs (Amlodipine besylate) .Marland Kitchen... 1 by mouth everyday    Metoprolol Tartrate 25 Mg Tabs (Metoprolol tartrate) .Marland Kitchen... Take one by mouth two times a day    Aspirin 81 Mg Tbec (Aspirin) .Marland Kitchen... Take one tablet by mouth daily  Her updated medication list for this problem includes:    Amlodipine Besylate 10 Mg Tabs (Amlodipine besylate) .Marland Kitchen... 1 by mouth everyday    Metoprolol Tartrate 25 Mg Tabs (Metoprolol tartrate) .Marland Kitchen... Take one by mouth two times a day    Aspirin 325 Mg Tabs  (Aspirin) .Marland Kitchen... Take one by mouth daily  Patient Instructions: 1)  Your physician recommends that you schedule a follow-up appointment in: 6 Months 2)  Your physician has recommended you make the following change in your medication: Decrease Aspirin 81 mg daily.

## 2010-08-26 NOTE — Assessment & Plan Note (Signed)
Summary: eph/kfw   Visit Type:  Follow-up Primary Provider:  Judith Part MD  CC:  pt states she had some cp right after she left the hospital...sob...no edema.  History of Present Illness: 70 yo female with h/o HTN, OA and morbid obesity recently admitted to [facility] from 07/27/09 to 07/31/09 with NSTEMI. She had a cardiac cath on 07/29/09 showing severe two vessel disease and we placed Promus drug eluting stents in the LAD and the RCA. She is here today for follow up.   She tells me that she has done well since discharge. There was one episode of mild CP shortly after discharge but no recurrence over the last three weeks. Her energy level has improved since she has gone home. Her breathing has overall improved. She has been taking all of her medications as prescribed. She has not yet started cardiac rehab.   Current Medications (verified): 1)  Amlodipine Besylate 10 Mg  Tabs (Amlodipine Besylate) .Marland Kitchen.. 1 By Mouth Qd 2)  Bl Vitamin C 1000 Mg  Tabs (Ascorbic Acid) .... One By Mouth Qd 3)  Calcium-Vitamin D 500-200 Mg-Unit Tabs (Calcium Carbonate-Vitamin D) .Marland Kitchen.. 1 Tab Once Daily 4)  Chlortrimaton Otc .... Take One By Mouth Two Times A Day As Needed 5)  Ra Flax Seed Oil 1000 1000 Mg Caps (Flaxseed (Linseed)) .... One By Mouth Daily 6)  Nasonex 50 Mcg/act Susp (Mometasone Furoate) .... 2 Sprays in Each Nostril Once Daily 7)  Vesicare 10 Mg Tabs (Solifenacin Succinate) .... Take One By Mouth Daily 8)  Plavix 75 Mg Tabs (Clopidogrel Bisulfate) .... Take One By Mouth Daily 9)  Pataday 0.2 % Soln (Olopatadine Hcl) .... Use One Drop Both Eyes Daily 10)  Metoprolol Tartrate 25 Mg Tabs (Metoprolol Tartrate) .... Take One By Mouth Two Times A Day 11)  Aspirin 325 Mg Tabs (Aspirin) .... Take One By Mouth Daily 12)  Crestor 20 Mg Tabs (Rosuvastatin Calcium) .... Take One By Mouth Daily 13)  Nitrostat 0.4 Mg Subl (Nitroglycerin) .... Take Sl As Directed As Needed  Allergies: 1)  !  Lipitor 2)  ! Zocor 3)  ! * Flonase 4)  Asa 5)  * Mavik  Past History:  Past Medical History: Reviewed history from 08/07/2009 and no changes required. 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 No CAD  Social History: She quit tobacco about 20 years ago.  Occasional  alcohol.   Denies illicit drug use.  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  The patient denies fatigue, malaise, fever, weight gain/loss, vision loss, decreased hearing, hoarseness, chest pain, palpitations, prolonged cough, wheezing, sleep apnea, coughing up blood, abdominal pain, blood in stool, nausea, vomiting, diarrhea, heartburn, incontinence, blood in urine, muscle weakness, joint pain, leg swelling, rash, skin lesions, headache, fainting, dizziness, depression, anxiety, enlarged lymph nodes, easy bruising or bleeding, and environmental allergies.    Vital Signs:  Patient profile:  70 year old female Height:      66 inches Weight:      224 pounds BMI:     36.29 Pulse rate:   78 / minute Pulse rhythm:   irregular BP sitting:   122 / 80  (left arm) Cuff size:   large  Vitals Entered By: Danielle Rankin, CMA (August 19, 2009 11:59  AM)  Physical Exam  General:  General: Well developed, well nourished, NAD HEENT: OP clear, mucus membranes moist SKIN: warm, dry Neuro: No focal deficits Musculoskeletal: Muscle strength 5/5 all ext Psychiatric: Mood and affect normal Neck: No JVD, no carotid bruits, no thyromegaly, no lymphadenopathy. Lungs:Clear bilaterally, no wheezes, rhonci, crackles CV: RRR, distant heart sounds. no murmurs, gallops rubs Abdomen: soft, NT, ND, BS present Extremities: No edema, pulses 2+.    Cardiac Cath  Procedure date:  07/29/2009  Findings:      Central aortic pressure 138/75 with a mean of 106.  LV pressure 143/19 with an EDP of 24.  There is no aortic stenosis.   Left main was normal. LAD was a long vessel wrapping the apex, gave off 2 small proximal diagonals and a large diagonal in the midsection.  There was a 30-40% lesion proximally followed by a 95% lesion in the midsection, just prior to the takeoff of the large third diagonal. Left circumflex gave off a ramus branch and 2 marginal branches, it was angiographically normal. Right coronary artery was a dominant vessel, gave off an RV branch, PDA, and 3 posterolaterals, had a 99% proximal lesion and 90% lesion in the midsection that was quite tortuous.  It appeared to have an anterior takeoff.   1. Successful percutaneous transluminal coronary angioplasty with     placement of a drug-eluting stent in the mid left anterior     descending (coronary artery). 2. Successful percutaneous transluminal coronary angioplasty with     placement of a drug-eluting stent extending from the proximal right     coronary artery down into the mid right coronary artery.  Left ventriculogram done in the RAO position had an EF of 55% with no regional wall motion abnormalities.  Echocardiogram  Procedure date:  07/29/2009  Findings:      Ejection fraction  55-60% with grade 1 diastolic dysfunction and mildly dilated left atrium as well as mild  left ventricular hypertrophy.  EKG  Procedure date:  08/19/2009  Findings:      Sinus rhythm, rate 78 bpm. PVC. Low voltage QRS.   Impression & Recommendations:  Problem # 1:  CAD, NATIVE VESSEL (ICD-414.01) Stable post cath/PCI with drug eluting stents placed in the LAD and the RCA. Continue ASA and Plavix for at least one year.  Continue beta blocker and statin as below. Phase two cardiac rehab as planned.   Her updated medication list for this problem includes:    Amlodipine Besylate 10 Mg Tabs (Amlodipine besylate) .Marland Kitchen... 1 by mouth qd    Plavix 75 Mg Tabs (Clopidogrel bisulfate) .Marland Kitchen... Take one by mouth daily    Metoprolol Tartrate 25 Mg Tabs (Metoprolol tartrate) .Marland Kitchen... Take one by mouth two times a day    Aspirin 325 Mg Tabs (Aspirin) .Marland Kitchen... Take one by mouth daily    Nitrostat 0.4 Mg Subl (Nitroglycerin) .Marland Kitchen... Take sl as directed as needed  Orders: EKG w/ Interpretation (93000)  Problem # 2:  HYPERTENSION (ICD-401.9) Well controlled on current therapy.   Her updated medication list for this problem includes:    Amlodipine Besylate 10  Mg Tabs (Amlodipine besylate) .Marland Kitchen... 1 by mouth qd    Metoprolol Tartrate 25 Mg Tabs (Metoprolol tartrate) .Marland Kitchen... Take one by mouth two times a day    Aspirin 325 Mg Tabs (Aspirin) .Marland Kitchen... Take one by mouth daily  Problem # 3:  HYPERLIPIDEMIA (ICD-272.4) Pt has this followed in Dr. Lucretia Roers clinic. Plans for repeat LFTs and fasting lipids in several weeks in Dr. Lucretia Roers office.   Her updated medication list for this problem includes:    Crestor 20 Mg Tabs (Rosuvastatin calcium) .Marland Kitchen... Take one by mouth daily   Patient Instructions: 1)  Your physician recommends that you schedule a follow-up appointment in: 6 months 2)  Your physician recommends that you continue on your current medications as directed. Please refer to the Current Medication list given to you today.

## 2010-08-26 NOTE — Progress Notes (Signed)
Summary: samples of Plavix  Phone Note Call from Patient Call back at [facility] Phone [phone]   Caller: Patient Summary of Call: Pt need samples Plavix Initial call taken by: Judie Grieve,  January 10, 2010 1:51 PM  Follow-up for Phone Call        Phone Call Completed PT AWARE  SAMPLES OF PLAVIX LEFT AT FRONT DESK. Follow-up by: Scherrie Bateman, LPN,  January 10, 2010 2:36 PM

## 2010-08-26 NOTE — Progress Notes (Signed)
Summary: nystatin  Phone Note Call from Patient Call back at [phone]   Caller: Patient Call For: Judith Part MD Summary of Call: Patient says that the cream that you prescribed to her for under her breast is helping. She is now out of the cream and wants to know if she can a refil of the NYSTATIN 100000 UNIT/GM CREA  [facility].  Initial call taken by: Melody Comas,  October 29, 2009 11:56 AM  Follow-up for Phone Call        she said earlier that nystatin did  NOT WORK-so I changed to lotrisone... which does she want?  Follow-up by: Judith Part MD,  October 29, 2009 12:41 PM  Additional Follow-up for Phone Call Additional follow up Details #1::        Called patient back and she said that she wants the LOTRISONE 1-0.05 % CREA. She says that she got the two mixed up.   that is fine px written on EMR for call in  Additional Follow-up by: Melody Comas,  October 29, 2009 2:53 PM    Additional Follow-up for Phone Call Additional follow up Details #2::    Patient notified as instructed by telephone. Medication phoned to [facility] as instructed. Lewanda Rife LPN  October 29, 8293 4:26 PM   Prescriptions: LOTRISONE 1-0.05 % CREA (CLOTRIMAZOLE-BETAMETHASONE) apply to aff area two times a day  #1 med x 2   Entered and Authorized by:   Judith Part MD   Signed by:   Lewanda Rife LPN on 62/13/0865   Method used:   Telephoned to ...         RxID:   [id number]

## 2010-08-26 NOTE — Progress Notes (Signed)
Summary: samples  Phone Note Call from Patient Call back at [phone]   Caller: Patient Summary of Call: request samples of plavix, crestor and metoprolol Initial call taken by: Migdalia Dk,  Dec 03, 2009 9:54 AM  Follow-up for Phone Call        spoke with pt. Patient states she is up to the limit on her insurance coverage. She can't afford to pay for the medication this month. Pt. would like to have samples of Crestor, Plavix and Metoprolol Tatrate. A 16/75 mg Plavix and 21/20 mg crestor sample tablets put at the front desk . Patient aware. I made pt. aware thst  she can get the Metoprolol Tatrate 25 mg in [facility] a 30 days supply for $ 4:00 dollars. Pt states will check about it, and will call the office back to send prescription. Follow-up by: Ollen Gross, RN, BSN,  Dec 03, 2009 10:34 AM

## 2010-08-26 NOTE — Assessment & Plan Note (Signed)
Summary: 6 MONTH FOLLOW UP /[facility]   Vital Signs:  Patient profile:   70 year old female Height:      66 inches Weight:      222.50 pounds BMI:     36.04 Temp:     98 degrees F oral Pulse rate:   78 / minute Pulse rhythm:   regular BP sitting:   118 / 70  (left arm) Cuff size:   large  Vitals Entered By: Lewanda Rife LPN (May 27, 2010 9:34 AM) CC: six month f/u   History of Present Illness: here for f/u of HTN and lipids with CAD and stents  is doing well overall  gets cold quickly from plavix -- cardiol told her that was normal    wt is up 2 lb  HTN very well controlled at 118/70  lipids very well controlled also Last Lipid ProfileCholesterol: 143 (05/20/2010 8:11:06 AM)HDL:  51.40 (05/20/2010 8:11:06 AM)LDL:  68 (05/20/2010 8:11:06 AM)Triglycerides:  Last Liver profileSGOT:  18 (05/20/2010 8:11:06 AM)SPGT:  17 (05/20/2010 8:11:06 AM)T. Bili:  0.4 (09/18/2009 9:11:08 AM)Alk Phos:  63 (09/18/2009 9:11:08 AM)   glucose was 111  does not eat a lot of sugar  loves pasta  is trying to eat lean protein -- avoiding red meat   [facility]  really enjoys it   has a spot on her chest that gets ice cold -- skin - ever since her stents put in   Allergies: 1)  ! Lipitor 2)  ! Zocor 3)  ! * Flonase 4)  ! 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 No CAD  Social History: Last updated: 08/19/2009 She quit tobacco about 20 years ago.  Occasional  alcohol.   Denies illicit drug use.  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 Eyes:  Denies blurring and eye irritation. CV:  Denies chest pain or discomfort, lightheadness, palpitations, and shortness of breath with exertion. Resp:  Denies cough, shortness of breath, and wheezing. GI:  Denies abdominal pain, change in bowel habits, indigestion, and nausea. MS:  Denies joint pain, joint redness, and joint swelling. Derm:  Denies itching, lesion(s), poor wound healing, and rash. Neuro:  Denies headaches, numbness, tingling, tremors, and weakness. Psych:  mood is good . Endo:  Denies cold intolerance, excessive thirst, excessive urination, and heat intolerance. Heme:  Denies abnormal bruising and bleeding.  Physical Exam  General:  obese and well appearing  Head:  normocephalic, atraumatic, and no abnormalities observed.   Eyes:  vision grossly intact, pupils equal, pupils round, and pupils reactive to light.  no conjunctival pallor, injection or icterus  Mouth:  pharynx pink and moist.   Neck:  supple with full rom and no  masses or thyromegally, no JVD or carotid bruit  Chest Wall:  No deformities, masses, or tenderness noted. Lungs:  Normal respiratory effort, chest expands symmetrically. Lungs are clear to auscultation, no crackles or wheezes. Heart:  Normal rate and regular rhythm. S1 and S2 normal without gallop, murmur, click, rub or other extra sounds. Abdomen:  Bowel sounds positive,abdomen soft and  non-tender without masses, organomegaly or hernias noted. no renal bruits  Msk:  No deformity or scoliosis noted of thoracic or lumbar spine.   Pulses:  R and L carotid,radial,femoral,dorsalis pedis and posterior tibial pulses are full and equal bilaterally Extremities:  No clubbing, cyanosis, edema, or deformity noted with normal full range of motion of all joints.   Neurologic:  sensation intact to light touch, gait normal, and DTRs symmetrical and normal.   Skin:  Intact without suspicious lesions or rashes Cervical Nodes:  No lymphadenopathy noted Inguinal Nodes:  No significant adenopathy Psych:  normal affect, talkative and pleasant    Impression & Recommendations:  Problem # 1:  HYPERLIPIDEMIA (ICD-272.4) Assessment Improved  this is very well controlled by crestor/ at goal does not tol other statins continue cardiol f/u f/u here 6 mo after labs  Her updated medication list for this problem includes:    Crestor 20 Mg Tabs (Rosuvastatin calcium) .Marland Kitchen... Take one by mouth daily  Labs Reviewed: SGOT: 18 (05/20/2010)   SGPT: 17 (05/20/2010)   HDL:51.40 (05/20/2010), 53.90 (09/18/2009)  LDL:68 (05/20/2010), 53 (09/18/2009)  Chol:143 (05/20/2010), 124 (09/18/2009)  Trig:120.0 (05/20/2010), 85.0 (09/18/2009)  Problem # 2:  HYPERTENSION (ICD-401.9) Assessment: Unchanged  good control on current med f/u 6 mo  Her updated medication list for this problem includes:    Amlodipine Besylate 10 Mg Tabs (Amlodipine besylate) .Marland Kitchen... 1 by mouth everyday    Metoprolol Tartrate 25 Mg Tabs (Metoprolol tartrate) .Marland Kitchen... Take one by mouth two times a day  BP today: 118/70 Prior BP: 126/80 (03/18/2010)  Labs Reviewed: K+: 3.9 (05/20/2010) Creat: : 1.1 (05/20/2010)   Chol: 143 (05/20/2010)   HDL: 51.40 (05/20/2010)   LDL: 68 (05/20/2010)   TG: 120.0 (05/20/2010)  Problem # 3:  HYPERGLYCEMIA (ICD-790.29) Assessment: New new and mild disc low glycemic diet - cutting portions of starches will  also exercise and work on wt loss f/u 6 mo -- AIC also  Problem # 4:  CAD, NATIVE VESSEL (ICD-414.01) Assessment: Improved doing well with stent and will finish plavix year in jan  still managing secondary risk factors  Her updated medication list for this problem includes:    Amlodipine Besylate 10 Mg Tabs (Amlodipine besylate) .Marland Kitchen... 1 by mouth everyday    Plavix 75 Mg Tabs (Clopidogrel bisulfate) .Marland Kitchen... Take one by mouth daily    Metoprolol Tartrate 25 Mg Tabs (Metoprolol tartrate) .Marland Kitchen... Take one by mouth two times a day    Aspirin 81 Mg Tbec (Aspirin) .Marland Kitchen... Take one tablet by mouth daily    Nitrostat 0.4 Mg Subl (Nitroglycerin) .Marland Kitchen... Take sl as directed as needed  Stable. No signs or symptoms of angina.   Will continue ASA and Plavix for at least one year post stent (DES placed january 2011). Will decrease ASA to 81 mg per day. Continue beta blocker and statin. Lipids checked February 2011 and well controlled (LDL 53, HDL 54, Total chol 124).   Her updated medication list for this problem includes:    Amlodipine Besylate 10 Mg Tabs (Amlodipine besylate) .Marland Kitchen... 1 by mouth everyday    Plavix 75 Mg Tabs (Clopidogrel bisulfate) .Marland KitchenMarland KitchenMarland KitchenMarland Kitchen  Take one by mouth daily    Metoprolol Tartrate 25 Mg Tabs (Metoprolol tartrate) .Marland Kitchen... Take one by mouth two times a day    Aspirin 325 Mg Tabs (Aspirin) .Marland Kitchen... Take one by mouth daily    Nitrostat 0.4 Mg Subl (Nitroglycerin) .Marland Kitchen... Take sl as directed as needed  Her updated medication list for this problem includes:    Amlodipine Besylate 10 Mg Tabs (Amlodipine besylate) .Marland Kitchen... 1 by mouth everyday    Plavix 75 Mg Tabs (Clopidogrel bisulfate) .Marland Kitchen... Take one by mouth daily    Metoprolol Tartrate 25 Mg Tabs (Metoprolol tartrate) .Marland Kitchen... Take one by mouth two times a day    Aspirin 325 Mg Tabs (Aspirin) .Marland Kitchen... Take one by mouth daily    Nitrostat 0.4 Mg Subl (Nitroglycerin) .Marland Kitchen... Take sl as directed as needed  Complete Medication List: 1)  Amlodipine Besylate 10 Mg  Tabs (Amlodipine besylate) .Marland Kitchen.. 1 by mouth everyday 2)  Bl Vitamin C 1000 Mg Tabs (Ascorbic acid) .... One by mouth everyday 3)  Calcium-vitamin D 500-200 Mg-unit Tabs (Calcium carbonate-vitamin d) .Marland Kitchen.. 1 tab once daily 4)  Chlortrimaton Otc  .... Take one by mouth two times a day as needed 5)  Ra Flax Seed Oil 1000 1000 Mg Caps (Flaxseed (linseed)) .... One by mouth daily 6)  Nasonex 50 Mcg/act Susp (Mometasone furoate) .... 2 sprays in each nostril once daily 7)  Vesicare 10 Mg Tabs (Solifenacin succinate) .... Take one by mouth daily 8)  Plavix 75 Mg Tabs (Clopidogrel bisulfate) .... Take one by mouth daily 9)  Metoprolol Tartrate 25 Mg Tabs (Metoprolol tartrate) .... Take one by mouth two times a day 10)  Aspirin 81 Mg Tbec (Aspirin) .... Take one tablet by mouth daily 11)  Crestor 20 Mg Tabs (Rosuvastatin calcium) .... Take one by mouth daily 12)  Nitrostat 0.4 Mg Subl (Nitroglycerin) .... Take sl as directed as needed 13)  Celebrex 200 Mg Caps (Celecoxib) .Marland Kitchen.. 1 cap once daily  Patient Instructions: 1)  try to watch carbohydrates - and even cut the diet soda 2)  keep up the exercise  3)  cholesterol is good  4)  sugar is up a bit  5)  schedule fasting lab in 6 months and then follow up lipid/ast/alt/renal / AIC for 272 , hyperglycemia    Orders Added: 1)  Est. Patient Level IV [36644]    Current Allergies (reviewed today): ! LIPITOR ! ZOCOR ! * FLONASE ! * FISH ASA * MAVIK

## 2010-08-26 NOTE — Progress Notes (Signed)
Summary: requesting samples  Phone Note Call from Patient   Caller: Patient [phone] Reason for Call: Talk to Nurse Summary of Call: pt requesting crestor and plavix samples -pls call [phone] Initial call taken by: Glynda Jaeger,  February 25, 2010 11:09 AM  Follow-up for Phone Call        Plavix 75 mg (8 tablets) and Crestor 20 mg (28 tablets) left at front desk for pt. to pick up. I called and left message on pt's private voice mail with this information. Follow-up by: Dossie Arbour, RN, BSN,  February 25, 2010 2:46 PM    Prescriptions: CRESTOR 20 MG TABS (ROSUVASTATIN CALCIUM) take one by mouth daily  #28 x 0   Entered by:   Dossie Arbour, RN, BSN   Authorized by:   Verne Carrow, MD   Signed by:   Dossie Arbour, RN, BSN on 02/25/2010   Method used:   Samples Given   RxID:   [id number] PLAVIX 75 MG TABS (CLOPIDOGREL BISULFATE) take one by mouth daily  #8 tablets x 0   Entered by:   Dossie Arbour, RN, BSN   Authorized by:   Verne Carrow, MD   Signed by:   Dossie Arbour, RN, BSN on 02/25/2010   Method used:   Samples Given   RxID:   [id number]

## 2010-08-26 NOTE — Progress Notes (Signed)
Summary: samples of crestor given   Phone Note Call from Patient   Caller: Patient Summary of Call: Pt requests samples of crestor, 4 sample boxes of #7 each given.  Lot number [id number], exp 10/2012. Initial call taken by: Lowella Petties CMA,  May 05, 2010 3:40 PM  Follow-up for Phone Call        thank you Follow-up by: Judith Part MD,  May 05, 2010 4:01 PM

## 2010-08-26 NOTE — Miscellaneous (Signed)
Summary: [facility]  [facility]   Imported By: Roderic Ovens 09/24/2009 15:23:42  _____________________________________________________________________  External Attachment:    Type:   Image     Comment:   External Document

## 2010-08-26 NOTE — Assessment & Plan Note (Signed)
Summary: HEART ATTACK FOLOW UP [place] DISC 1/5/[facility]   Vital Signs:  Patient profile:   70 year old female Weight:      226 pounds Temp:     97.9 degrees F oral Pulse rate:   84 / minute Pulse rhythm:   regular BP sitting:   130 / 68  (left arm) Cuff size:   large  Vitals Entered By: Lowella Petties CMA (August 08, 2009 9:32 AM) CC: hospital follow up for MI.  Also has cough.   History of Present Illness: here for hosp f/u  pt was hosp for cp and MI -- 2 vessel stent drug eluding done (had 3 blockages - 2 were close together)  her symptoms were atypical - more like indigestion - followed by pressure  had to call EMS and chew aspirin -- and pain resolved quickly  enzymes bumped in ER   is less out of breath than she used to be -- before the cath   lipids at d/c LDL 104-- was on lovastatin and now on crestor  no trouble with that at all - is good    now on asa and plavix and lopressor   has lost 4 lb since last visit  is starting to really work on it with low salt and low fat diet  sees cardiol on 1/24  is already doing some exercise   has a cold that is driven her crazy - picked it up in the hospital  some prod cough - not severe/ no fever or chills  thinks it is getting better   Allergies: 1)  ! Lipitor 2)  ! Zocor 3)  ! 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  Social History: Last updated: 08/07/2009 She quit tobacco about 20 years ago.  Occasional   alcohol.  Denies illicit drug use.   Risk Factors: Smoking Status: quit (10/15/2006)  Review of Systems General:  Denies fatigue, fever, loss of appetite, and malaise. Eyes:  Denies blurring, discharge, and eye irritation. ENT:  Complains of nasal congestion, postnasal drainage, and sore throat; denies earache and sinus pressure. CV:  Denies chest pain or discomfort, difficulty breathing at night, lightheadness, palpitations, shortness of breath with exertion, and swelling of feet. Resp:  Denies cough and wheezing. GI:  Denies abdominal pain and change in bowel habits. MS:  Denies joint pain and muscle aches. Derm:  Denies poor wound healing and rash; bruises from cath and blood draws are improving. Neuro:  Denies numbness and tingling. Psych:  Denies anxiety and depression. Endo:  Denies cold intolerance, excessive thirst, excessive urination, and heat intolerance. Heme:  Denies abnormal bruising and bleeding.  Physical Exam  General:  overweight but generally well appearing  Head:  normocephalic, atraumatic, and no abnormalities observed.  no sinus tenderness  Eyes:  vision grossly intact, pupils equal, pupils round, pupils reactive to light, and no injection.   Ears:  R ear normal and  L ear normal.   Nose:  nares are congested and injected bilat  Mouth:  pharynx pink and moist, no erythema, and no exudates.   Neck:  supple with full rom and no masses or thyromegally, no JVD or carotid bruit  Lungs:  Normal respiratory effort, chest expands symmetrically. Lungs are clear to auscultation, no crackles or wheezes. Heart:  Normal rate and regular rhythm. S1 and S2 normal without gallop, murmur, click, rub or  other extra sounds. Abdomen:  soft and non-tender.   Msk:  No deformity or scoliosis noted of thoracic or lumbar spine.   Pulses:  R and L carotid,radial,femoral,dorsalis pedis and posterior tibial pulses are full and equal bilaterally Extremities:  No clubbing, cyanosis, edema, or deformity noted with normal full range of motion of all joints.   Neurologic:  sensation intact to light touch, gait normal, and DTRs symmetrical and normal.   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 1 by mouth qd    Plavix 75 Mg Tabs (Clopidogrel bisulfate) .Marland Kitchen... Take one by mouth daily    Metoprolol Tartrate 25 Mg Tabs (Metoprolol tartrate) .Marland Kitchen... Take one by mouth two times a day    Aspirin 325 Mg Tabs (Aspirin) .Marland Kitchen... Take one by mouth daily    Nitrostat 0.4 Mg Subl (Nitroglycerin) .Marland Kitchen... Take sl as directed as needed  Problem # 2:  HYPERLIPIDEMIA (ICD-272.4) Assessment: Improved  LDL was 104 in hosp and goal is under 70 doing ok on crestor so far  rev low sat fat diet  lab 4-6 wk f/u 3 mo  Her updated medication list for this problem includes:    Crestor 20 Mg Tabs (Rosuvastatin calcium) .Marland Kitchen... Take one by mouth daily  Labs Reviewed: SGOT: 16 (09/01/2007)   SGPT: 18 (09/01/2007)   HDL:40.6 (09/01/2007), 40.8 (04/11/2007)  LDL:DEL (09/01/2007), DEL (04/11/2007)  Chol:197 (09/01/2007), 201 (04/11/2007)  Trig:208  (09/01/2007), 202 (04/11/2007)  Problem # 3:  ALLERGIC RHINITIS (ICD-477.9) Assessment: Unchanged refil nasonex which works well  Her updated medication list for this problem includes:    Nasonex 50 Mcg/act Susp (Mometasone furoate) .Marland Kitchen... 2 sprays in each nostril once daily  Problem # 4:  URI (ICD-465.9) mild/ viral with dry cough  recommend sympt care- see pt instructions   pt advised to update me if symptoms worsen or do not improve - esp if sinus pain/ fever or worse cough The following medications were removed from the medication list:    Celebrex 200 Mg Caps (Celecoxib) ..... One by mouth dialy Her updated medication list for this problem includes:    Aspirin 325 Mg Tabs (Aspirin) .Marland Kitchen... Take one by mouth daily  Problem # 5:  HYPERTENSION (ICD-401.9) Assessment: Unchanged well controlled currently with meds  disc imp of blood pressure control in light of CAD adv to watch sodium in diet  Her updated medication list for this problem includes:    Amlodipine Besylate 10 Mg Tabs (Amlodipine  besylate) .Marland Kitchen... 1 by mouth qd    Metoprolol Tartrate 25 Mg Tabs (Metoprolol tartrate) .Marland Kitchen... Take one by mouth two times a day  BP today: 130/68 Prior BP: 150/74 (06/10/2009)  Labs Reviewed: K+: 3.8 (08/10/2006) Creat: : 0.7 (08/10/2006)   Chol: 197 (09/01/2007)   HDL: 40.6 (09/01/2007)   LDL: DEL (09/01/2007)   TG: 208 (09/01/2007)  Complete Medication List: 1)  Amlodipine Besylate 10 Mg Tabs (Amlodipine besylate) .Marland Kitchen.. 1 by mouth qd 2)  Bl Vitamin C 1000 Mg Tabs (Ascorbic acid) .... One by mouth qd 3)  Calcium With Vit D  .... Take by mouth as directed 4)  Chlortrimaton Otc  .... Take one by mouth two times a day as needed 5)  Ra Flax Seed Oil 1000 1000 Mg Caps (Flaxseed (linseed)) .... One by mouth daily 6)  Nasonex 50 Mcg/act Susp (Mometasone furoate) .... 2 sprays in each nostril once daily 7)  Vesicare 10 Mg Tabs (Solifenacin succinate) .... Take one by mouth daily 8)  Plavix 75 Mg Tabs  (Clopidogrel bisulfate) .... Take one by mouth daily 9)  Pataday 0.2 % Soln (Olopatadine hcl) .... Use one drop both eyes daily 10)  Metoprolol Tartrate 25 Mg Tabs (Metoprolol tartrate) .... Take one by mouth two times a day 11)  Aspirin 325 Mg Tabs (Aspirin) .... Take one by mouth daily 12)  Crestor 20 Mg Tabs (Rosuvastatin calcium) .... Take one by mouth daily 13)  Nitrostat 0.4 Mg Subl (Nitroglycerin) .... Take sl as directed as needed  Patient Instructions: 1)  drink lots of fluids -- and you can try mucinex for cough as needed 2)  if increased cough or fever or not improving in 4-5 more days  3)  no change in medicines  4)  schedule fasting labs in 4-6 weeks lipids /ast/alt 272 and then follow up with me in about  3months  Prescriptions: NASONEX 50 MCG/ACT SUSP (MOMETASONE FUROATE) 2 sprays in each nostril once daily  #1 mdi x 11   Entered and Authorized by:   Judith Part MD   Signed by:   Judith Part MD on 08/08/2009   Method used:   Print then Give to Patient   RxID:   [id number]   Prior Medications (reviewed today): AMLODIPINE BESYLATE 10 MG  TABS (AMLODIPINE BESYLATE) 1 by mouth qd BL VITAMIN C 1000 MG  TABS (ASCORBIC ACID) one by mouth qd CALCIUM WITH VIT D () take by mouth as directed CHLORTRIMATON OTC () take one by mouth two times a day as needed RA FLAX SEED OIL 1000 1000 MG CAPS (FLAXSEED (LINSEED)) one by mouth daily NASONEX 50 MCG/ACT SUSP (MOMETASONE FUROATE) 2 sprays in each nostril once daily VESICARE 10 MG TABS (SOLIFENACIN SUCCINATE) take one by mouth daily PLAVIX 75 MG TABS (CLOPIDOGREL BISULFATE) take one by mouth daily PATADAY 0.2 % SOLN (OLOPATADINE HCL) use one drop both eyes daily METOPROLOL TARTRATE 25 MG TABS (METOPROLOL TARTRATE) take one by mouth two times a day ASPIRIN 325 MG TABS (ASPIRIN) take one by mouth daily CRESTOR 20 MG TABS (ROSUVASTATIN CALCIUM) take one by mouth daily NITROSTAT 0.4 MG SUBL (NITROGLYCERIN) take SL as directed  as needed Current Allergies: ! LIPITOR ! ZOCOR ! * FLONASE ASA * MAVIK

## 2010-08-28 NOTE — Progress Notes (Signed)
  Faxed EKG to [facility] at [facility]. [phone] Pho:[phone] Marylou Mccoy  August 11, 2010 10:10 AM

## 2010-08-28 NOTE — Letter (Signed)
Summary: Clearance Letter  [employer], Main Office  [address]   [place], [place] [address]   Phone: [phone]  Fax: [phone]    August 07, 2010  Re:     Griffith Citron Address:   [address] [place], [place]  [address] DOB:     Aug 20, 1940 MRN:     [id number]   To whom it may concern,  Kelli Velasquez has been cleared for her knee surgery from a cardiac standpoint. She may hold her Plavix before surgery and should resume post-op. If you have any concerns, please contact our office at the number above.    Sincerely,  Dr. Verne Carrow Whitney Maeola Sarah RN

## 2010-08-28 NOTE — Progress Notes (Signed)
Summary: surgical clearence letter  Phone Note From Other Clinic   Caller: dr Thayer Ohm smith;s office-joanie Summary of Call: joanie callling to see if they can get a surgical clearence letter asap for pt's knee replacement on 08-19-10 fax to them att joanie [phone] Initial call taken by: Glynda Jaeger,  August 07, 2010 1:56 PM  Follow-up for Phone Call        patient cleared for surgery. Information was faxed. Whitney Maeola Sarah RN  August 07, 2010 2:14 PM  Follow-up by: Whitney Maeola Sarah RN,  August 07, 2010 2:14 PM

## 2010-08-28 NOTE — Assessment & Plan Note (Signed)
Summary: sur/left knee surgery/mj   Primary Provider:  Colon Flattery Tower MD  CC:  no cardiac complaints.  History of Present Illness: 70 yo female with h/o HTN, OA and morbid obesity admitted to [facility] from 07/27/09 to 07/31/09 with NSTEMI. She had a cardiac cath on 07/29/09 showing severe two vessel disease and we placed Promus drug eluting stents in the LAD and the RCA. She is here today for planned caridac follow up. She has had no chest pain or SOB. She has been doing well. She is exercising daily. She is planning on having left knee replacement on January 24th with Dr. Reita Chard at [facility].   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 1 By Mouth Everyday 2)  Bl Vitamin C 1000 Mg  Tabs (Ascorbic Acid) .... One By Mouth Everyday 3)  Calcium-Vitamin D 500-200 Mg-Unit Tabs (Calcium Carbonate-Vitamin D) .Marland Kitchen.. 1 Tab Once Daily 4)  Chlortrimaton Otc .... Take One By Mouth Two Times A Day As Needed 5)  Ra Flax Seed Oil 1000 1000 Mg Caps (Flaxseed (Linseed)) .... One By Mouth Daily 6)  Nasonex 50 Mcg/act Susp (Mometasone Furoate) .... 2 Sprays in Each Nostril Once Daily 7)  Vesicare 10 Mg Tabs (Solifenacin Succinate) .... Take One By Mouth Daily 8)  Plavix 75 Mg Tabs (Clopidogrel Bisulfate) .... Take One By Mouth Daily 9)  Metoprolol Tartrate 25 Mg Tabs (Metoprolol Tartrate) .... Take One By Mouth Two Times A Day 10)  Aspirin 81 Mg Tbec  (Aspirin) .... Take One Tablet By Mouth Daily 11)  Crestor 20 Mg Tabs (Rosuvastatin Calcium) .... Take One By Mouth Daily 12)  Nitrostat 0.4 Mg Subl (Nitroglycerin) .... Take Sl As Directed As Needed 13)  Celebrex 200 Mg Caps (Celecoxib) .Marland Kitchen.. 1 Cap Once Daily 14)  Pepcid Ac Maximum Strength 20 Mg Tabs (Famotidine) .... As Needed.  Allergies: 1)  ! Lipitor 2)  ! Zocor 3)  ! * Flonase 4)  ! * Fish 5)  Asa 6)  * Mavik  Past History:  Past Medical History: Reviewed history from 08/07/2009 and no changes required. MYOCARDIAL INFARCTION, SUBENDOCARDIAL, INITIAL EPISODE (ICD-410.71) HYPERLIPIDEMIA (ICD-272.4) HYPERTENSION (ICD-401.9) OBESITY (ICD-278.00) URI (ICD-465.9) NEOPLASM OF UNCERTAIN BEHAVIOR OF SKIN (ICD-238.2) VERTIGO (ICD-780.4) OTHER SCREENING MAMMOGRAM (ICD-V76.12) SCREENING FOR MALIGNANCY NOS (ICD-V76.9) MYOPIA (ICD-367.1) OVERACTIVE BLADDER (ICD-596.51) URINARY INCONTINENCE (ICD-788.30) OSTEOARTHRITIS (ICD-715.90) ASTHMA (ICD-493.90) ALLERGIC RHINITIS (ICD-477.9) RASH AND OTHER NONSPECIFIC SKIN ERUPTION (ICD-782.1)    Social History: Reviewed history from 08/19/2009 and no changes required. She quit tobacco about 20 years ago.  Occasional  alcohol.   Denies illicit drug use.  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    Vital Signs:  Patient profile:   70 year old female Height:      66  inches Weight:      227.50 pounds Pulse rate:   72 / minute Resp:     14 per minute BP sitting:   138 / 70  (left arm)  Vitals Entered By: Ellender Hose RN (August 07, 2010 11:42 AM) CC: no cardiac complaints   Physical Exam  General:  General: Well developed, well nourished, NAD HEENT: OP clear, mucus membranes moist SKIN: warm, dry Neuro: No focal deficits Musculoskeletal: Muscle strength 5/5 all ext Psychiatric: Mood and affect normal Neck: No JVD, no carotid bruits, no thyromegaly, no lymphadenopathy. Lungs:Clear bilaterally, no wheezes, rhonci, crackles CV: RRR no murmurs, gallops rubs Abdomen: soft, NT, ND, BS present Extremities: No edema, pulses 2+.    EKG  Procedure date:  08/07/2010  Findings:      NSR, rate 72 bpm. Poor R wave progression through the precordial leads.   Impression & Recommendations:  Problem # 1:  CAD, NATIVE VESSEL (ICD-414.01) Stable. Continue dual antiplatelet therapy with ASA and Plavix. She will not need further cardiac workup prior to her planned left knee replacement. It should be ok to hold her Plavix for the 5-7 days leading up to the surgery. Plavix should be restarted post-op.   Her updated medication list for this problem includes:    Amlodipine Besylate 10 Mg Tabs (Amlodipine besylate) .Marland Kitchen... 1 by mouth everyday    Plavix 75 Mg Tabs (Clopidogrel bisulfate) .Marland Kitchen... Take one by mouth daily    Metoprolol Tartrate 25 Mg Tabs (Metoprolol tartrate) .Marland Kitchen... Take one by mouth two times a day    Aspirin 81 Mg Tbec (Aspirin) .Marland Kitchen... Take one tablet by mouth daily    Nitrostat 0.4 Mg Subl (Nitroglycerin) .Marland Kitchen... Take sl as directed as needed  Orders: EKG w/ Interpretation (93000)  Problem # 2:  HYPERTENSION (ICD-401.9) BP well controlled.   Her updated medication list for this problem includes:    Amlodipine Besylate 10 Mg Tabs (Amlodipine besylate) .Marland Kitchen... 1 by mouth everyday    Metoprolol Tartrate 25 Mg Tabs (Metoprolol tartrate) .Marland Kitchen... Take  one by mouth two times a day    Aspirin 81 Mg Tbec (Aspirin) .Marland Kitchen... Take one tablet by mouth daily  Problem # 3:  HYPERLIPIDEMIA (ICD-272.4) Lipids well controlled October.  LFTs ok.  Continue statin.   Her updated medication list for this problem includes:    Crestor 20 Mg Tabs (Rosuvastatin calcium) .Marland Kitchen... Take one by mouth daily  Patient Instructions: 1)  Your physician recommends that you schedule a follow-up appointment in: 6 months 2)  Your physician recommends that you continue on your current medications as directed. Please refer to the Current Medication list given to you today.

## 2010-08-28 NOTE — Progress Notes (Signed)
Summary: question on her plavix  Phone Note Call from Patient Call back at Home Phone [phone]   Caller: Patient Reason for Call: Talk to Nurse Summary of Call: question on her plavix. pt is having surgery. Initial call taken by: Roe Coombs,  June 26, 2010 3:25 PM  Follow-up for Phone Call        I have written a letter to Dr. Gwenlyn Fudge based on a letter written to [place] in the EMR. Can we find out how to fax this to Dr. Katrinka Blazing. He is not in our biscom fax book. thanks, chris  Pt. will be on Plavix for one year starting January 2012. We will send a clearance letter to her doctor for surgical clearance starting  Jan. 2012. Whitney Maeola Sarah RN  June 26, 2010 5:02 PM  Follow-up by: Verne Carrow, MD,  June 26, 2010 3:40 PM

## 2010-10-01 ENCOUNTER — Encounter: Payer: Self-pay | Admitting: Family Medicine

## 2010-10-02 ENCOUNTER — Encounter: Payer: Self-pay | Admitting: Family Medicine

## 2010-10-10 ENCOUNTER — Ambulatory Visit (INDEPENDENT_AMBULATORY_CARE_PROVIDER_SITE_OTHER): Payer: Medicare Other | Admitting: Family Medicine

## 2010-10-10 ENCOUNTER — Encounter: Payer: Self-pay | Admitting: Family Medicine

## 2010-10-10 DIAGNOSIS — R490 Dysphonia: Secondary | ICD-10-CM

## 2010-10-12 LAB — CARDIAC PANEL(CRET KIN+CKTOT+MB+TROPI)
CK, MB: 1.4 ng/mL (ref 0.3–4.0)
CK, MB: 3.5 ng/mL (ref 0.3–4.0)
Relative Index: INVALID (ref 0.0–2.5)
Relative Index: INVALID (ref 0.0–2.5)
Relative Index: INVALID (ref 0.0–2.5)
Total CK: 20 U/L (ref 7–177)
Troponin I: 0.15 ng/mL — ABNORMAL HIGH (ref 0.00–0.06)
Troponin I: 0.54 ng/mL (ref 0.00–0.06)
Troponin I: 0.62 ng/mL (ref 0.00–0.06)

## 2010-10-12 LAB — DIFFERENTIAL
Basophils Absolute: 0 10*3/uL (ref 0.0–0.1)
Basophils Relative: 0 % (ref 0–1)
Eosinophils Absolute: 0 10*3/uL (ref 0.0–0.7)
Monocytes Absolute: 1.3 10*3/uL — ABNORMAL HIGH (ref 0.1–1.0)
Monocytes Relative: 6 % (ref 3–12)
Neutrophils Relative %: 87 % — ABNORMAL HIGH (ref 43–77)

## 2010-10-12 LAB — BASIC METABOLIC PANEL
BUN: 12 mg/dL (ref 6–23)
BUN: 14 mg/dL (ref 6–23)
CO2: 30 mEq/L (ref 19–32)
CO2: 30 mEq/L (ref 19–32)
Calcium: 9.3 mg/dL (ref 8.4–10.5)
Chloride: 104 mEq/L (ref 96–112)
Creatinine, Ser: 0.79 mg/dL (ref 0.4–1.2)
Glucose, Bld: 134 mg/dL — ABNORMAL HIGH (ref 70–99)
Glucose, Bld: 152 mg/dL — ABNORMAL HIGH (ref 70–99)
Potassium: 4.3 mEq/L (ref 3.5–5.1)
Sodium: 143 mEq/L (ref 135–145)

## 2010-10-12 LAB — CBC
Hemoglobin: 13.5 g/dL (ref 12.0–15.0)
MCHC: 34.6 g/dL (ref 30.0–36.0)
MCHC: 34.9 g/dL (ref 30.0–36.0)
MCHC: 35 g/dL (ref 30.0–36.0)
MCV: 92.7 fL (ref 78.0–100.0)
MCV: 93.2 fL (ref 78.0–100.0)
MCV: 93.3 fL (ref 78.0–100.0)
Platelets: 195 10*3/uL (ref 150–400)
RBC: 4.13 MIL/uL (ref 3.87–5.11)
RBC: 4.31 MIL/uL (ref 3.87–5.11)
RBC: 4.32 MIL/uL (ref 3.87–5.11)
RBC: 4.4 MIL/uL (ref 3.87–5.11)
RDW: 14.9 % (ref 11.5–15.5)
RDW: 15 % (ref 11.5–15.5)
RDW: 15.2 % (ref 11.5–15.5)
WBC: 10.9 10*3/uL — ABNORMAL HIGH (ref 4.0–10.5)
WBC: 13.5 10*3/uL — ABNORMAL HIGH (ref 4.0–10.5)

## 2010-10-12 LAB — POCT I-STAT, CHEM 8
Calcium, Ion: 1.23 mmol/L (ref 1.12–1.32)
Creatinine, Ser: 0.7 mg/dL (ref 0.4–1.2)
Glucose, Bld: 113 mg/dL — ABNORMAL HIGH (ref 70–99)
HCT: 40 % (ref 36.0–46.0)
Hemoglobin: 13.6 g/dL (ref 12.0–15.0)
Potassium: 3.5 mEq/L (ref 3.5–5.1)
TCO2: 30 mmol/L (ref 0–100)

## 2010-10-12 LAB — URINALYSIS, MICROSCOPIC ONLY
Bilirubin Urine: NEGATIVE
Hgb urine dipstick: NEGATIVE
Protein, ur: NEGATIVE mg/dL
Urobilinogen, UA: 0.2 mg/dL (ref 0.0–1.0)

## 2010-10-12 LAB — URINE CULTURE: Culture: NO GROWTH

## 2010-10-12 LAB — GLUCOSE, CAPILLARY
Glucose-Capillary: 154 mg/dL — ABNORMAL HIGH (ref 70–99)
Glucose-Capillary: 157 mg/dL — ABNORMAL HIGH (ref 70–99)

## 2010-10-12 LAB — CULTURE, BLOOD (ROUTINE X 2): Culture: NO GROWTH

## 2010-10-12 LAB — APTT: aPTT: 32 seconds (ref 24–37)

## 2010-10-12 LAB — LIPID PANEL
Cholesterol: 187 mg/dL (ref 0–200)
LDL Cholesterol: 104 mg/dL — ABNORMAL HIGH (ref 0–99)
Total CHOL/HDL Ratio: 4.6 RATIO

## 2010-10-12 LAB — POCT CARDIAC MARKERS: CKMB, poc: 1.6 ng/mL (ref 1.0–8.0)

## 2010-10-14 NOTE — Assessment & Plan Note (Signed)
Summary: THROAT/CLE   MEDICARE [employer]   Vital Signs:  Patient profile:   70 year old female Weight:      230.25 pounds Temp:     98.3 degrees F oral Pulse rate:   82 / minute Pulse rhythm:   regular BP sitting:   138 / 84  (left arm) Cuff size:   large  Vitals Entered By: Selena Batten Dance CMA Duncan Dull) (October 10, 2010 4:03 PM) CC: Hoarseness since surgery Comments **Was intubated during surgery   History of Present Illness: CC: hoarse since surgery  L TKR surgery 08/19/2010.  intubated.  hoarse since then.  has tried sucret lozenges.    Has h/o dry mouth.  Not worse than prior to surgery.  Mild congestion intermittently.  No RN, ST, coughing, fevers/chills, HA.  quit smoking 30 years ago, smoked about 10 yrs (1 ppd)  h/o GERD, on pepcid.  well controlled.  noted when rested voice (husband out of town for 1 wk) voice cambe back, but now hoarseness returned.  has had hoarseness consistently since surgery/intubation.  Current Medications (verified): 1)  Amlodipine Besylate 10 Mg  Tabs (Amlodipine Besylate) .Marland Kitchen.. 1 By Mouth Everyday 2)  Bl Vitamin C 1000 Mg  Tabs (Ascorbic Acid) .... One By Mouth Everyday 3)  Calcium-Vitamin D 500-200 Mg-Unit Tabs (Calcium Carbonate-Vitamin D) .Marland Kitchen.. 1 Tab Once Daily 4)  Chlortrimaton Otc .... Take One By Mouth Two Times A Day As Needed 5)  Ra Flax Seed Oil 1000 1000 Mg Caps (Flaxseed (Linseed)) .... One By Mouth Daily 6)  Nasonex 50 Mcg/act Susp (Mometasone Furoate) .... 2 Sprays in Each Nostril Once Daily 7)  Vesicare 10 Mg Tabs (Solifenacin Succinate) .... Take One By Mouth Daily 8)  Plavix 75 Mg Tabs (Clopidogrel Bisulfate) .... Take One By Mouth Daily 9)  Metoprolol Tartrate 25 Mg Tabs (Metoprolol Tartrate) .... Take One By Mouth Two Times A Day 10)  Aspirin 81 Mg Tbec (Aspirin) .... Take One Tablet By Mouth Daily 11)  Crestor 20 Mg Tabs (Rosuvastatin Calcium) .... Take One By Mouth Daily 12)  Nitrostat 0.4 Mg Subl (Nitroglycerin) .... Take Sl As  Directed As Needed 13)  Celebrex 200 Mg Caps (Celecoxib) .Marland Kitchen.. 1 Cap Once Daily 14)  Pepcid Ac Maximum Strength 20 Mg Tabs (Famotidine) .... As Needed.  Allergies: 1)  ! Lipitor 2)  ! Zocor 3)  ! * Flonase 4)  ! 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) HYPERLIPIDEMIA (ICD-272.4) HYPERTENSION (ICD-401.9) OBESITY (ICD-278.00) URI (ICD-465.9) NEOPLASM OF UNCERTAIN BEHAVIOR OF SKIN (ICD-238.2) VERTIGO (ICD-780.4) OTHER SCREENING MAMMOGRAM (ICD-V76.12) SCREENING FOR MALIGNANCY NOS (ICD-V76.9) MYOPIA (ICD-367.1) OVERACTIVE BLADDER (ICD-596.51) URINARY INCONTINENCE (ICD-788.30) OSTEOARTHRITIS (ICD-715.90) ASTHMA (ICD-493.90) ALLERGIC RHINITIS (ICD-477.9) RASH AND OTHER NONSPECIFIC SKIN ERUPTION (ICD-782.1)    Social History: Last updated: 08/19/2009 She quit tobacco about 20 years ago.  Occasional  alcohol.   Denies illicit drug use.  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   Eyes:  vision grossly intact, pupils equal, pupils round, and pupils reactive to light.  no conjunctival pallor, injection or icterus  Ears:  R ear normal and L ear normal.   Nose:  nares clear bilaterally Mouth:  pharynx pink and moist.   Neck:  supple with full rom and no masses or thyromegally, no JVD or carotid bruit no LAD Lungs:  Normal respiratory effort, chest expands symmetrically. Lungs are clear to auscultation, no crackles or wheezes. Heart:  Normal rate and regular rhythm. S1 and S2 normal without gallop, murmur, click, rub or other  extra sounds.   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  referral to ENT for larynx eval.  going on 2 months, not yet persistent (3 mo).  bothersome to patient. in meanwhile, treat with sample of prilosec 20mg  daily to see if any GERD/LER component (although reports good control on pepcid prn) rec rest voice.  Orders: ENT Referral (ENT)  Complete Medication List: 1)  Amlodipine Besylate 10 Mg Tabs (Amlodipine besylate) .Marland Kitchen.. 1 by mouth everyday 2)  Bl Vitamin C 1000 Mg Tabs (Ascorbic acid) .... One by mouth everyday 3)  Calcium-vitamin D 500-200 Mg-unit Tabs (Calcium carbonate-vitamin d) .Marland Kitchen.. 1 tab once daily 4)  Chlortrimaton Otc  .... Take one by mouth two times a day as needed 5)  Ra Flax Seed Oil 1000 1000 Mg Caps (Flaxseed (linseed)) .... One by mouth daily 6)  Nasonex 50 Mcg/act Susp (Mometasone furoate) .... 2 sprays in each nostril once daily 7)  Vesicare 10 Mg Tabs (Solifenacin succinate) .... Take one by mouth daily 8)  Plavix 75 Mg Tabs (Clopidogrel bisulfate) .... Take one by mouth daily 9)  Metoprolol Tartrate 25 Mg Tabs (Metoprolol tartrate) .... Take one by mouth two times a day 10)  Aspirin 81 Mg Tbec (Aspirin) .... Take one tablet by mouth daily 11)  Crestor 20 Mg Tabs (Rosuvastatin calcium) .... Take one by mouth daily 12)  Nitrostat 0.4 Mg Subl (Nitroglycerin) .... Take sl as directed as needed 13)  Celebrex 200 Mg Caps (Celecoxib) .Marland Kitchen.. 1 cap once daily 14)  Pepcid Ac Maximum Strength 20 Mg Tabs (Famotidine) .... As needed.  Patient Instructions: 1)  hoarseness - we will refer you to ENT doctor for further evaluation, possible speech therapy 2)  pass [facility] for referral 3)  trial of prilosec for next 10 days.   Orders Added: 1)  ENT Referral [ENT] 2)  Est. Patient Level III [62130]    Current Allergies (reviewed today): ! LIPITOR ! ZOCOR ! * FLONASE ! * FISH ASA * MAVIK

## 2010-10-15 LAB — POCT CARDIAC MARKERS
CKMB, poc: <1 ng/mL — ABNORMAL LOW (ref 1.0–8.0)
Myoglobin, poc: 68.5 ng/mL (ref 12–200)

## 2010-10-21 ENCOUNTER — Telehealth: Payer: Self-pay | Admitting: Cardiovascular Disease

## 2010-10-21 NOTE — Telephone Encounter (Signed)
LOV,12 lead faxed to [facility] @ [phone] 10/21/10/KM

## 2010-10-27 ENCOUNTER — Other Ambulatory Visit: Payer: Self-pay | Admitting: Family Medicine

## 2010-10-27 ENCOUNTER — Telehealth: Payer: Self-pay | Admitting: Cardiovascular Disease

## 2010-10-27 NOTE — Telephone Encounter (Signed)
Patient will hold Plavix 5 days prior to surgery and will resume afterwards. She will stay on her baby Aspirin.

## 2010-10-27 NOTE — Telephone Encounter (Signed)
Pt's Kelli Velasquez [phone] pt got surg clearence faxed to surgeon, however it stated she was to go off plavix and aspirin, last time she only had to go off plavix-wanted to verify

## 2010-11-05 ENCOUNTER — Ambulatory Visit: Payer: Self-pay | Admitting: Otolaryngology

## 2010-11-06 LAB — PATHOLOGY REPORT

## 2010-11-20 ENCOUNTER — Other Ambulatory Visit: Payer: Self-pay | Admitting: Family Medicine

## 2010-11-20 DIAGNOSIS — E7800 Pure hypercholesterolemia, unspecified: Secondary | ICD-10-CM

## 2010-11-25 ENCOUNTER — Other Ambulatory Visit (INDEPENDENT_AMBULATORY_CARE_PROVIDER_SITE_OTHER): Payer: Medicare Other | Admitting: Family Medicine

## 2010-11-25 DIAGNOSIS — E7800 Pure hypercholesterolemia, unspecified: Secondary | ICD-10-CM

## 2010-11-25 DIAGNOSIS — E119 Type 2 diabetes mellitus without complications: Secondary | ICD-10-CM

## 2010-11-25 LAB — RENAL FUNCTION PANEL
Albumin: 3.5 g/dL (ref 3.5–5.2)
BUN: 22 mg/dL (ref 6–23)
CO2: 32 mEq/L (ref 19–32)
Chloride: 106 mEq/L (ref 96–112)
Creatinine, Ser: 1 mg/dL (ref 0.4–1.2)

## 2010-11-25 LAB — AST: AST: 30 U/L (ref 0–37)

## 2010-11-25 LAB — LIPID PANEL
HDL: 49.9 mg/dL (ref >39.00)
Total CHOL/HDL Ratio: 3

## 2010-11-27 ENCOUNTER — Ambulatory Visit: Payer: Self-pay | Admitting: Family Medicine

## 2010-11-28 ENCOUNTER — Ambulatory Visit: Payer: Self-pay | Admitting: Family Medicine

## 2010-12-03 ENCOUNTER — Ambulatory Visit (INDEPENDENT_AMBULATORY_CARE_PROVIDER_SITE_OTHER): Payer: Medicare Other | Admitting: Family Medicine

## 2010-12-03 ENCOUNTER — Encounter: Payer: Self-pay | Admitting: Family Medicine

## 2010-12-03 DIAGNOSIS — R7309 Other abnormal glucose: Secondary | ICD-10-CM

## 2010-12-03 DIAGNOSIS — I10 Essential (primary) hypertension: Secondary | ICD-10-CM

## 2010-12-03 DIAGNOSIS — E785 Hyperlipidemia, unspecified: Secondary | ICD-10-CM

## 2010-12-03 DIAGNOSIS — E669 Obesity, unspecified: Secondary | ICD-10-CM

## 2010-12-03 NOTE — Assessment & Plan Note (Signed)
Well controlled with amlodipine and beta blocker Continue to monitor Rev labs F/u check up 6 mo

## 2010-12-03 NOTE — Assessment & Plan Note (Signed)
Improved with better diet  a1c is below 6 - continue to monitor Disc low glycemic diet

## 2010-12-03 NOTE — Assessment & Plan Note (Signed)
Overall well controlled on crestor - but up a bit after soft diet with surgery  Will expect imp with return to regular habits  Rev labs with pt  Rev low sat fat diet

## 2010-12-03 NOTE — Assessment & Plan Note (Signed)
Wt down 4 lb- enc to keep working on that

## 2010-12-03 NOTE — Progress Notes (Signed)
Subjective:    Patient ID: Kelli Velasquez, female    DOB: 16-Jun-1941, 70 y.o.   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  Had her L knee replaced  Then vocal cord surgery  Does water aerobics 3 d per week   Lipids are well controlled on crestor and diet  HDL of 49 and LDL 78 That went up a bit Had to eat some hamburger and soft diet for a while  Back to more vegetables  Lab Results  Component Value Date   CHOL 147 11/25/2010   CHOL 143 05/20/2010   CHOL 124 09/18/2009   Lab Results  Component Value Date   HDL 49.90 11/25/2010   HDL 51.40 05/20/2010   HDL 53.90 09/18/2009   Lab Results  Component Value Date   LDLCALC 78 11/25/2010   LDLCALC 68 05/20/2010   LDLCALC 53 09/18/2009   Lab Results  Component Value Date   TRIG 96.0 11/25/2010   TRIG 120.0 05/20/2010   TRIG 85.0 09/18/2009   Lab Results  Component Value Date   CHOLHDL 3 11/25/2010   CHOLHDL 3 05/20/2010   CHOLHDL 2 09/18/2009   Lab Results  Component Value Date   LDLDIRECT 124.8 09/01/2007   LDLDIRECT 132.2 04/11/2007   LDLDIRECT 171.4 03/03/2007     HTN in good control 120/79 with amlodipine and beta blocker  No ha or edema or cp No heart trouble in any of this   Hyperglycemia very mild with a1c of 5.6  Diet - is improving -- no sweets or sugar  Her problem is pasta    Wt is down 4 lb  Alt is up a bit on stain - no liver problems or alcohol    Review of Systems Review of Systems  Constitutional: Negative for fever, appetite change, and unexpected weight change. , some fatigue  Eyes: Negative for pain and visual disturbance.  Respiratory: Negative for cough and shortness of breath.   Cardiovascular: Negative.   Gastrointestinal: Negative for nausea, diarrhea and constipation.  Genitourinary: Negative for urgency and  frequency.  Skin: Negative for pallor.  Neurological: Negative for weakness, light-headedness, numbness and headaches.  Hematological: Negative for adenopathy. Does not bruise/bleed easily.  Psychiatric/Behavioral: Negative for dysphoric mood. The patient is not nervous/anxious.          Objective:   Physical Exam  Constitutional: She appears well-developed and well-nourished. No distress.       overwt and well appearing   HENT:  Head: Normocephalic and atraumatic.  Mouth/Throat: Oropharynx is clear and moist.  Eyes: Conjunctivae and EOM are normal. Pupils are equal, round, and reactive to light.  Neck: Normal range of motion. Neck supple. No JVD present. Carotid bruit is not present. No thyromegaly present.  Cardiovascular: Normal rate, regular rhythm and normal heart sounds.  Exam reveals no gallop.   Pulmonary/Chest: Effort normal and breath sounds normal. No respiratory distress. She has no wheezes.  Abdominal: Soft. Bowel sounds are normal. She exhibits no distension, no abdominal bruit and no mass. There is no tenderness.  Musculoskeletal: Normal range of motion. She exhibits no edema and no tenderness.  Lymphadenopathy:    She has no cervical adenopathy.  Neurological: She is alert. She has normal reflexes. Coordination normal.  Skin: Skin is warm and  dry. No rash noted. No erythema. No pallor.  Psychiatric: She has a normal mood and affect.          Assessment & Plan:

## 2010-12-03 NOTE — Patient Instructions (Signed)
Keep working on the diet and exercise and weight loss  Eat low sat fat diet (Avoid red meat/ fried foods/ egg yolks/ fatty breakfast meats/ butter, cheese and high fat dairy/ and shellfish  ) Blood pressure is good  No change in medicines Follow up in 6 months with labs prior for 30 minute check up

## 2011-02-05 ENCOUNTER — Encounter: Payer: Self-pay | Admitting: Cardiovascular Disease

## 2011-02-05 ENCOUNTER — Ambulatory Visit (INDEPENDENT_AMBULATORY_CARE_PROVIDER_SITE_OTHER): Payer: Medicare Other | Admitting: Cardiovascular Disease

## 2011-02-05 VITALS — BP 132/78 | HR 75 | Ht 66.0 in | Wt 223.8 lb

## 2011-02-05 DIAGNOSIS — I2510 Atherosclerotic heart disease of native coronary artery without angina pectoris: Secondary | ICD-10-CM

## 2011-02-05 NOTE — Progress Notes (Signed)
History of Present Illness:69 yo female with h/o HTN, OA and morbid obesity admitted to [facility] from 07/27/09 to 07/31/09 with NSTEMI. She had a cardiac cath on 07/29/09 showing severe two vessel disease and we placed Promus drug eluting stents in the LAD and the RCA. She is here today for planned caridac follow up. She has had no chest pain or SOB. She has been doing well. She is exercising daily.   Her primary care physician is [facility].    Past Medical History  Diagnosis Date  . Myocardial infarction     subendocardial, initial episode  . Hyperlipidemia   . Hypertension   . Obesity   . URI (upper respiratory infection)   . Neoplasm of skin     neoplasm of uncertain behavior of skin  . Vertigo   . Myopia   . Overactive bladder   . Urinary incontinence   . Osteoarthritis   . Asthma   . Allergy     allergic rhinitis  . Rash     and other non specific skin eruptions    Past Surgical History  Procedure Date  . Cholecystectomy   . Joint replacement     right total knee replacement  . Coronary angioplasty with stent placement 07/2009  . 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 amLODipine (NORVASC) 10 MG tablet TAKE 1 TABLET EVERY DAY  30 tablet  11  . Ascorbic Acid (VITAMIN C) 1000 MG tablet Take 1,000 mg by mouth daily.        Marland Kitchen aspirin 81 MG tablet Take 81 mg by mouth daily.        . calcium-vitamin D (OSCAL WITH D 500-200) 500-200 MG-UNIT per tablet Take 1 tablet by mouth daily.        . chlorpheniramine (CHLOR-TRIMETON) 4 MG tablet Take 4 mg by mouth 2 (two) times daily as needed.        . clopidogrel (PLAVIX) 75 MG tablet Take 75 mg by mouth daily.        . Flaxseed, Linseed, (RA FLAX SEED OIL 1000) 1000 MG CAPS One capsule by mouth daily       . metoprolol tartrate (LOPRESSOR) 25 MG tablet Take 25 mg by mouth 2 (two) times daily.        . mometasone  (NASONEX) 50 MCG/ACT nasal spray 2 sprays in each nostril once daily       . nitroGLYCERIN (NITROSTAT) 0.4 MG SL tablet Take sublingual as directed as needed.       Marland Kitchen omeprazole (PRILOSEC OTC) 20 MG tablet OTC as directed.       . rosuvastatin (CRESTOR) 20 MG tablet Take 20 mg by mouth daily.        . solifenacin (VESICARE) 10 MG tablet Take 10 mg by mouth daily.          Allergies  Allergen Reactions  . Aspirin     REACTION: nausea and vomiting  . Atorvastatin     REACTION: leg pain  . Fluticasone Propionate     REACTION: not effective  . Simvastatin     REACTION: muscle pain  . Trandolapril     REACTION: leg pain    History   Social History  . Marital Status: Married    Spouse Name: N/A    Number of Children: N/A  . Years of Education:  N/A   Occupational History  . Not on file.   Social History Main Topics  . Smoking status: Former Games developer  . Smokeless tobacco: Not on file  . Alcohol Use: Yes  . Drug Use: No  . Sexually Active:    Other Topics Concern  . Not on file   Social History Narrative  . No narrative on file    Family History  Problem Relation Age of Onset  . Stroke Mother   . Stroke Father     Review of Systems:  As stated in the HPI and otherwise negative.   BP 132/78  Pulse 75  Ht 5\' 6"  (1.676 m)  Wt 223 lb 12.8 oz (101.515 kg)  BMI 36.12 kg/m2  LMP 07/27/1980  Physical Examination: General: Well developed, well nourished, NAD HEENT: OP clear, mucus membranes moist SKIN: warm, dry. No rashes. Neuro: No focal deficits Musculoskeletal: Muscle strength 5/5 all ext Psychiatric: Mood and affect normal Neck: No JVD, no carotid bruits, no thyromegaly, no lymphadenopathy. Lungs:Clear bilaterally, no wheezes, rhonci, crackles Cardiovascular: Regular rate and rhythm. No murmurs, gallops or rubs. Abdomen:Soft. Bowel sounds present. Non-tender.  Extremities: No lower extremity edema. Pulses are 2 + in the bilateral DP/PT.  EKG:NSR, rate 75  bpm. Poor R wave progression through the precordial leads.

## 2011-02-05 NOTE — Patient Instructions (Signed)
Your physician wants you to follow-up in: 12 months with Dr. McAlhany.  You will receive a reminder letter in the mail two months in advance. If you don't receive a letter, please call our office to schedule the follow-up appointment.   

## 2011-02-05 NOTE — Assessment & Plan Note (Signed)
Stable. No changes. Continue ASA and Plavix.

## 2011-03-31 ENCOUNTER — Other Ambulatory Visit: Payer: Self-pay | Admitting: *Deleted

## 2011-03-31 MED ORDER — CLOTRIMAZOLE-BETAMETHASONE 1-0.05 % EX CREA: 1.0000 "application " | TOPICAL_CREAM | Freq: Two times a day (BID) | CUTANEOUS | Status: DC

## 2011-03-31 NOTE — Telephone Encounter (Signed)
Will refill electronically  

## 2011-03-31 NOTE — Telephone Encounter (Signed)
Last refill 03/30/2010, please advise.

## 2011-06-05 ENCOUNTER — Encounter: Payer: Self-pay | Admitting: Family Medicine

## 2011-06-05 ENCOUNTER — Ambulatory Visit (INDEPENDENT_AMBULATORY_CARE_PROVIDER_SITE_OTHER): Payer: Medicare Other | Admitting: Family Medicine

## 2011-06-05 VITALS — BP 128/78 | HR 76 | Temp 98.0°F | Wt 233.0 lb

## 2011-06-05 DIAGNOSIS — L03213 Periorbital cellulitis: Secondary | ICD-10-CM | POA: Insufficient documentation

## 2011-06-05 DIAGNOSIS — H44009 Unspecified purulent endophthalmitis, unspecified eye: Secondary | ICD-10-CM

## 2011-06-05 DIAGNOSIS — H00039 Abscess of eyelid unspecified eye, unspecified eyelid: Secondary | ICD-10-CM

## 2011-06-05 MED ORDER — CLINDAMYCIN HCL 300 MG PO CAPS: 300.0000 mg | ORAL_CAPSULE | Freq: Three times a day (TID) | ORAL | Status: AC

## 2011-06-05 NOTE — Patient Instructions (Signed)
Concern for preseptal cellulitis.  Treat with clindamycin three times daily for ten days, continue alternating warm and cool compresses. If not improved in 24 hours, re eval over weekend (saturday clinic at [facility]). If worsening, or fever >101, or further swelling of eye or pain with eye movements, go to ER for further evaluation.

## 2011-06-05 NOTE — Progress Notes (Signed)
  Subjective:    Patient ID: Kelli Velasquez, female    DOB: 08/21/1940, 70 y.o.   MRN: [id number]  HPI CC: R eye infection?  2d h/o eye infection, started as pimple below right lateral eyelid.  Yesterday trouble sleeping and pain/itching/burning.  When woke up this morning swelling worse.  Tried ice and bacitracin and cool compresses today, feeling better, just very tight around eye.  Redness in eye started this morning as well.  No fevers/chills, blurry vision or trouble with vision, pain with eye movements, HA, coughing.  No eye pain.  Stays congested - attributes to allergies.  Didn't take mascara off.  Doesn't wear eye contacts.  Review of Systems Per HPI    Objective:   Physical Exam  Nursing note and vitals reviewed. Constitutional: She appears well-developed and well-nourished. No distress.  HENT:  Head: Normocephalic and atraumatic.  Mouth/Throat: Oropharynx is clear and moist.  Eyes: EOM are normal. Pupils are equal, round, and reactive to light. Right eye exhibits discharge (watery). Left eye exhibits no discharge. Right conjunctiva is injected. Left conjunctiva is not injected. No scleral icterus.         Bulbar and palpebral conjunctival swelling and erythema. EOMI intact without pain. No proptosis. Lateral eye with dried pustul, surrounding erythema/swelling  Neck: Normal range of motion. Neck supple.  Lymphadenopathy:    She has no cervical adenopathy.  Skin: Skin is warm and dry. No rash noted.  Psychiatric: She has a normal mood and affect.       Assessment & Plan:

## 2011-06-05 NOTE — Assessment & Plan Note (Addendum)
Concern for preseptal cellulitis from cellulitis around eye that started as pimple. Treat with clindamycin tid x 10 d. If not improved in 24 hours, re eval over weekend. If worsening, or fever >101, or further swelling of eye or pain with eye movements, go to ER for further eval, likely CT/IV abx.

## 2011-06-08 ENCOUNTER — Telehealth: Payer: Self-pay | Admitting: Family Medicine

## 2011-06-08 ENCOUNTER — Ambulatory Visit (INDEPENDENT_AMBULATORY_CARE_PROVIDER_SITE_OTHER): Payer: Medicare Other | Admitting: Family Medicine

## 2011-06-08 ENCOUNTER — Encounter: Payer: Self-pay | Admitting: Family Medicine

## 2011-06-08 DIAGNOSIS — L03213 Periorbital cellulitis: Secondary | ICD-10-CM

## 2011-06-08 DIAGNOSIS — H00039 Abscess of eyelid unspecified eye, unspecified eyelid: Secondary | ICD-10-CM

## 2011-06-08 NOTE — Telephone Encounter (Signed)
Spoke with patient. She said her eye isn't as red as it was, but it is still swollen. She also has a place that has come up close to her mouth. I scheduled a follow up this morning.

## 2011-06-08 NOTE — Progress Notes (Signed)
  Subjective:    Patient ID: Kelli Velasquez, female    DOB: 1941/05/16, 70 y.o.   MRN: [id number]  HPI CC: f/u eye  Seen here 06/05/2011 with concern for preseptal cellulitis, treated with clindamycin and warm compresses, advised to go to ER over weekend if not improving as expected.  Thinks improving with time.  Eye less swollen, less red.  Staying itchy.  No burning or tingling prior to infection.  Had second spot develop lateral left lip after here last.    No fevers/chills, vision changes, diplopia, no pain with eye movements.  Tolerating clindamycin tid, cold compresses.  Using refresh tears tid as well.    Wanted to come in today for recheck.  Goes to [employer], wonders if can go (aerobic exercises in pool)  Review of Systems Per HPI    Objective:   Physical Exam  Nursing note and vitals reviewed. Constitutional: She appears well-developed and well-nourished. No distress.  HENT:  Head: Normocephalic and atraumatic.  Right Ear: Hearing, tympanic membrane, external ear and ear canal normal.  Mouth/Throat: Oropharynx is clear and moist. No oropharyngeal exudate.  Eyes: EOM are normal. Pupils are equal, round, and reactive to light. Right conjunctiva is injected. Left conjunctiva is injected. No scleral icterus.       Continued injection R>L but improved swelling of bulbar/palpebral conjunctiva compared to last week. Limbic sparing continued  Neck: Normal range of motion. Neck supple.  Lymphadenopathy:    She has no cervical adenopathy.  Skin:       Lateral to lower eyelid dark scab New lesion lateral to lower lip, scab Nontender, some pruritis.  Psychiatric: She has a normal mood and affect.      Assessment & Plan:

## 2011-06-08 NOTE — Telephone Encounter (Signed)
Noted  

## 2011-06-08 NOTE — Patient Instructions (Addendum)
Looks like slowly improving.  I think you have developed some impetigo of skin lesions.  You are on correct medication for this (clindamycin). Please let us know if fevers, spreading redness or worse pain, new spots, or not improving as expected. F/u with pcp next week, sooner if needed.

## 2011-06-08 NOTE — Assessment & Plan Note (Signed)
Anticipate continued preseptal cellulitis, with impetiginized skin lesions on face. On clinda, slowly improving.  Continue med along with cold compresses and refresh tears. Update [place] if not improving as expected.

## 2011-06-08 NOTE — Telephone Encounter (Signed)
Can we call pt for an update on how eye is doing?  [email or web address].

## 2011-06-09 ENCOUNTER — Other Ambulatory Visit (INDEPENDENT_AMBULATORY_CARE_PROVIDER_SITE_OTHER): Payer: Medicare Other

## 2011-06-09 ENCOUNTER — Telehealth: Payer: Self-pay | Admitting: Family Medicine

## 2011-06-09 DIAGNOSIS — R7309 Other abnormal glucose: Secondary | ICD-10-CM

## 2011-06-09 DIAGNOSIS — I10 Essential (primary) hypertension: Secondary | ICD-10-CM

## 2011-06-09 DIAGNOSIS — E785 Hyperlipidemia, unspecified: Secondary | ICD-10-CM

## 2011-06-09 LAB — CBC WITH DIFFERENTIAL/PLATELET
Basophils Relative: 0.5 % (ref 0.0–3.0)
Eosinophils Absolute: 0.3 10*3/uL (ref 0.0–0.7)
Eosinophils Relative: 3 % (ref 0.0–5.0)
Lymphocytes Relative: 23.2 % (ref 12.0–46.0)
MCHC: 33.9 g/dL (ref 30.0–36.0)
Monocytes Relative: 8.1 % (ref 3.0–12.0)
Neutrophils Relative %: 65.2 % (ref 43.0–77.0)
RBC: 4.48 Mil/uL (ref 3.87–5.11)
WBC: 8.7 10*3/uL (ref 4.5–10.5)

## 2011-06-09 LAB — COMPREHENSIVE METABOLIC PANEL
AST: 22 U/L (ref 0–37)
Albumin: 3.7 g/dL (ref 3.5–5.2)
BUN: 22 mg/dL (ref 6–23)
Calcium: 9.2 mg/dL (ref 8.4–10.5)
Chloride: 104 mEq/L (ref 96–112)
Potassium: 4.3 mEq/L (ref 3.5–5.1)

## 2011-06-09 LAB — LIPID PANEL
Total CHOL/HDL Ratio: 3
Triglycerides: 130 mg/dL (ref 0.0–149.0)

## 2011-06-09 LAB — TSH: TSH: 1.65 u[IU]/mL (ref 0.35–5.50)

## 2011-06-09 NOTE — Telephone Encounter (Signed)
Message copied by Judy Pimple on Tue Jun 09, 2011  8:27 AM ------      Message from: Alvina Chou      Created: Wed Jun 03, 2011  4:31 PM      Regarding: labs for 11-13       Labs for 30 min f/u with Dr

## 2011-06-16 ENCOUNTER — Encounter: Payer: Self-pay | Admitting: Internal Medicine

## 2011-06-16 ENCOUNTER — Encounter: Payer: Self-pay | Admitting: Family Medicine

## 2011-06-16 ENCOUNTER — Ambulatory Visit (INDEPENDENT_AMBULATORY_CARE_PROVIDER_SITE_OTHER): Payer: Medicare Other | Admitting: Family Medicine

## 2011-06-16 VITALS — BP 138/78 | HR 84 | Temp 97.9°F | Ht 65.5 in | Wt 233.0 lb

## 2011-06-16 DIAGNOSIS — I10 Essential (primary) hypertension: Secondary | ICD-10-CM

## 2011-06-16 DIAGNOSIS — R7309 Other abnormal glucose: Secondary | ICD-10-CM

## 2011-06-16 DIAGNOSIS — Z1211 Encounter for screening for malignant neoplasm of colon: Secondary | ICD-10-CM

## 2011-06-16 DIAGNOSIS — E669 Obesity, unspecified: Secondary | ICD-10-CM

## 2011-06-16 DIAGNOSIS — L03213 Periorbital cellulitis: Secondary | ICD-10-CM

## 2011-06-16 DIAGNOSIS — Z23 Encounter for immunization: Secondary | ICD-10-CM

## 2011-06-16 DIAGNOSIS — Z1231 Encounter for screening mammogram for malignant neoplasm of breast: Secondary | ICD-10-CM

## 2011-06-16 DIAGNOSIS — E785 Hyperlipidemia, unspecified: Secondary | ICD-10-CM

## 2011-06-16 DIAGNOSIS — H00039 Abscess of eyelid unspecified eye, unspecified eyelid: Secondary | ICD-10-CM

## 2011-06-16 NOTE — Progress Notes (Signed)
Subjective:    Patient ID: Kelli Velasquez, female    DOB: May 20, 1941, 70 y.o.   MRN: [id number]  HPI Here for check up of chronic medical problems and for cough and to review health mt list and to re check preseptal cellulitis on clinda  Just finishing the clinca today Overall much much better  Scab just came off   Is having a lot of aches and pains- thinks it is from crestor   Wt is stable - obese   bp is stable at 138/78 No cp or edema or palpitation  Former smoker  Has a cough --for 2 weeks- thinks from post nasal drip and allergies - not productive  Sounds a little wheezy  Has nasal spray and all med No inhaler A little ache in back when she takes a deep breath    Flu shot- not yet  Pneumovax 2011 Td 06 Zoster-thinking about   Gyn care had pap in 12/06- nl pap  No gyn problems  No new sexual partners    Mammogram was 5/11 Self exam   Colon cancer screening - has not had a colonoscopy  Is interested in one    Cholesterol  Lab Results  Component Value Date   CHOL 122 06/09/2011   CHOL 147 11/25/2010   CHOL 143 05/20/2010   Lab Results  Component Value Date   HDL 44.70 06/09/2011   HDL 49.90 11/25/2010   HDL 51.40 05/20/2010   Lab Results  Component Value Date   LDLCALC 51 06/09/2011   LDLCALC 78 11/25/2010   LDLCALC 68 05/20/2010   Lab Results  Component Value Date   TRIG 130.0 06/09/2011   TRIG 96.0 11/25/2010   TRIG 120.0 05/20/2010   Lab Results  Component Value Date   CHOLHDL 3 06/09/2011   CHOLHDL 3 11/25/2010   CHOLHDL 3 05/20/2010   Lab Results  Component Value Date   LDLDIRECT 124.8 09/01/2007   LDLDIRECT 132.2 04/11/2007   LDLDIRECT 171.4 03/03/2007   fairly good control with crestor and diet  Is watching diet for the most  Still doing water aerobics 3 times per week (until eye infx is better )  Has hx of CAD  Patient Active Problem List  Diagnoses  . HYPERLIPIDEMIA  . OBESITY  . MYOPIA  . HYPERTENSION  . MYOCARDIAL INFARCTION,  SUBENDOCARDIAL, INITIAL EPISODE  . CAD, NATIVE VESSEL  . ALLERGIC RHINITIS  . ASTHMA  . OVERACTIVE BLADDER  . OSTEOARTHRITIS  . URINARY INCONTINENCE  . HYPERGLYCEMIA  . DYSPHONIA  . Preseptal cellulitis  . Special screening for malignant neoplasms, colon  . Other screening mammogram   Past Medical History  Diagnosis Date  . Myocardial infarction     subendocardial, initial episode  . Hyperlipidemia   . Hypertension   . Obesity   . Neoplasm of skin     neoplasm of uncertain behavior of skin  . Vertigo   . Myopia   . Overactive bladder   . Urinary incontinence   . Osteoarthritis   . Asthma   . Allergy     allergic rhinitis  . Rash     and other non specific skin eruptions   Past Surgical History  Procedure Date  . Cholecystectomy   . Joint replacement     right total knee replacement  . Coronary angioplasty with stent placement 07/2009  . 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  Smoking status: Former Games developer  . Smokeless tobacco: Not on file  . Alcohol Use: Yes   Family History  Problem Relation Age of Onset  . Stroke Mother   . Stroke Father    Allergies  Allergen Reactions  . Aspirin     REACTION: nausea and vomiting  . Atorvastatin     REACTION: leg pain  . Fluticasone Propionate     REACTION: not effective  . Simvastatin     REACTION: muscle pain  . Trandolapril     REACTION: leg pain   Current Outpatient Prescriptions on File Prior to Visit  Medication Sig Dispense Refill  . acetaminophen (TYLENOL) 500 MG tablet Take 1,000 mg by mouth 2 (two) times daily.        Marland Kitchen amLODipine (NORVASC) 10 MG tablet TAKE 1 TABLET EVERY DAY  30 tablet  11  . Ascorbic Acid (VITAMIN C) 1000 MG tablet Take 1,000 mg by mouth daily.        Marland Kitchen aspirin 81 MG tablet Take 81 mg by mouth daily.        . calcium-vitamin D (OSCAL WITH D 500-200) 500-200 MG-UNIT per tablet Take 1 tablet by mouth daily.        .  chlorpheniramine (CHLOR-TRIMETON) 4 MG tablet Take 4 mg by mouth 2 (two) times daily as needed.        . clopidogrel (PLAVIX) 75 MG tablet Take 75 mg by mouth daily.        . clotrimazole-betamethasone (LOTRISONE) cream Apply 1 application topically 2 (two) times daily. As needed  30 g  0  . Flaxseed, Linseed, (RA FLAX SEED OIL 1000) 1000 MG CAPS One capsule by mouth daily       . metoprolol tartrate (LOPRESSOR) 25 MG tablet Take 25 mg by mouth 2 (two) times daily.        Marland Kitchen omeprazole (PRILOSEC OTC) 20 MG tablet Take 20 mg by mouth daily.       . rosuvastatin (CRESTOR) 20 MG tablet Take 20 mg by mouth daily.        . solifenacin (VESICARE) 10 MG tablet Take 10 mg by mouth every other day.       . mometasone (NASONEX) 50 MCG/ACT nasal spray 2 sprays in each nostril once daily       . nitroGLYCERIN (NITROSTAT) 0.4 MG SL tablet Take sublingual as directed as needed.            Review of Systems Review of Systems  Constitutional: Negative for fever, appetite change, fatigue and unexpected weight change.  Eyes: Negative for pain and visual disturbance. pos for irritation that is improving  Respiratory: Negative for cough and shortness of breath.   Cardiovascular: Negative for cp or palpitations    Gastrointestinal: Negative for nausea, diarrhea and constipation.  Genitourinary: Negative for urgency and frequency.  Skin: Negative for pallor or rash  scabs on face are much improved  MSK pos for muscle aches and pains  Neurological: Negative for weakness, light-headedness, numbness and headaches.  Hematological: Negative for adenopathy. Does not bruise/bleed easily.  Psychiatric/Behavioral: Negative for dysphoric mood. The patient is not nervous/anxious.          Objective:   Physical Exam  Constitutional: She appears well-developed and well-nourished. No distress.  HENT:  Head: Normocephalic and atraumatic.  Right Ear: External ear normal.  Left Ear: External ear normal.  Nose: Nose  normal.  Mouth/Throat: Oropharynx is clear and moist. No oropharyngeal exudate.  Eyes: Conjunctivae  and EOM are normal. Pupils are equal, round, and reactive to light. Right eye exhibits no discharge. Left eye exhibits no discharge.       R eye much improved Minimal lower lid swelling with lateral tiny area of scab No redness  Scabs under nose and corner of mouth are healing well   Neck: Normal range of motion. Neck supple. No JVD present. Carotid bruit is not present. No thyromegaly present.  Cardiovascular: Normal rate, regular rhythm, normal heart sounds and intact distal pulses.  Exam reveals no gallop.   Pulmonary/Chest: Effort normal and breath sounds normal. No respiratory distress. She has no wheezes. She exhibits no tenderness.  Abdominal: Soft. Bowel sounds are normal. She exhibits no distension and no mass. There is no tenderness.  Genitourinary: No breast swelling, tenderness, discharge or bleeding.  Musculoskeletal: Normal range of motion. She exhibits no edema and no tenderness.  Lymphadenopathy:    She has no cervical adenopathy.  Neurological: She is alert. She has normal reflexes. No cranial nerve deficit. She exhibits normal muscle tone. Coordination normal.       No acute joint swelling or tenderness   Skin: Skin is warm and dry. No rash noted. No erythema. No pallor.  Psychiatric: She has a normal mood and affect.          Assessment & Plan:

## 2011-06-16 NOTE — Patient Instructions (Addendum)
Your eye is looking good  The spots on your face are also improving - continue the antibiotic ointment Update me if increased redness/ swelling/ any fever Flu shot today We will refer you for colonoscopy at checkout  If you are interested in a shingles/zoster vaccine - call your insurance to check on coverage,( you should not get it within 1 month of other vaccines) , then call us for a prescription  for it to take to a pharmacy that gives the shot  Hold your crestor for muscle pain - then call and leave me a message in 2 weeks re: how your aches and pains are - then we will make further plan  We will refer you for mammogram at check out  Avoid red meat/ fried foods/ egg yolks/ fatty breakfast meats/ butter, cheese and high fat dairy/ and shellfish  Work on weight loss the best you can

## 2011-06-17 ENCOUNTER — Ambulatory Visit: Payer: Self-pay | Admitting: Family Medicine

## 2011-06-19 ENCOUNTER — Encounter: Payer: Self-pay | Admitting: Family Medicine

## 2011-06-22 ENCOUNTER — Encounter: Payer: Self-pay | Admitting: *Deleted

## 2011-06-22 NOTE — Assessment & Plan Note (Signed)
Pt possibly having myalgias on crestor - though it is working well  Will hold 2 wk and update Disc low sat fat diet in detail

## 2011-06-22 NOTE — Assessment & Plan Note (Signed)
Ref for screening colonoscopy  No bowel changes

## 2011-06-22 NOTE — Assessment & Plan Note (Signed)
Rev imp of wt loss for better health/ to avoid DM and help CAD Disc plan of healthy diet and exercise

## 2011-06-22 NOTE — Assessment & Plan Note (Signed)
This is improving greatly on clindamycin  Will continue to follow  Pt adv to call asap if worse or other symptoms like fever or vision change

## 2011-06-22 NOTE — Assessment & Plan Note (Signed)
Lab Results  Component Value Date   HGBA1C 5.4 06/09/2011   continue to watch sugar in diet and work on healthy wt

## 2011-06-22 NOTE — Assessment & Plan Note (Signed)
bp in fair control at this time  No changes needed  Disc lifstyle change with low sodium diet and exercise   

## 2011-06-22 NOTE — Assessment & Plan Note (Signed)
Scheduled annual screening mammogram Nl breast exam today  Encouraged monthly self exams   

## 2011-06-30 ENCOUNTER — Telehealth: Payer: Self-pay | Admitting: Internal Medicine

## 2011-06-30 ENCOUNTER — Telehealth: Payer: Self-pay | Admitting: Cardiovascular Disease

## 2011-06-30 DIAGNOSIS — E785 Hyperlipidemia, unspecified: Secondary | ICD-10-CM

## 2011-06-30 NOTE — Telephone Encounter (Signed)
New problem Pt said she stopped taking crestor and is feeling a lot better. No muscle pain and fatigue. She wants to start another med. Please call

## 2011-06-30 NOTE — Telephone Encounter (Signed)
Spoke with pt. She reports she stopped Crestor 2 weeks ago after visit with Dr. Milinda Antis.  She was having muscle pain all over and pain is now gone after stopping Crestor.  She has been unable to tolerate Lipitor or Zocor in past due to muscle pain.  Will review with Dr. Clifton James. Pt's cell number is [phone]

## 2011-06-30 NOTE — Telephone Encounter (Signed)
Patient called and stated that since she has been off of Crestor her muscle pain has gone and her energy has increased and she doesn't feel as tired but did want to know if her Cardiologist should get involved with this Dr. Sanjuana Kava deciding which medication to switch to for her cholesterol.

## 2011-06-30 NOTE — Telephone Encounter (Signed)
Patient notified as instructed by telephone. Crestor removed from med list and listed on allergy/intolerance list.

## 2011-06-30 NOTE — Telephone Encounter (Signed)
I would have her confer with her cardiologist just because this is the 3rd statin she could not take - has failed lipitor / zocor and crestor now  Please keep me updated-thanks - glad she is feeling better Please change med list and put crestor on her allergy/ intolerance list -thanks

## 2011-07-01 MED ORDER — ROSUVASTATIN CALCIUM 10 MG PO TABS: ORAL_TABLET | ORAL | Status: DC

## 2011-07-01 NOTE — Telephone Encounter (Signed)
Spoke with pt and gave her instructions from Dr. Clifton James. She is agreeable to trying this and will take Crestor 10 mg every other day.  She will call us back if muscle pain returns. Med list updated. Crestor removed from allergy list.  Will add back if pt unable to tolerate.

## 2011-07-01 NOTE — Telephone Encounter (Signed)
Can we have her cut her Crestor pill in half and take it every other day. This will be 10 mg every other day. Thanks, chris

## 2011-07-02 ENCOUNTER — Telehealth: Payer: Self-pay | Admitting: Internal Medicine

## 2011-07-02 NOTE — Telephone Encounter (Signed)
That sounds fine, give it a try

## 2011-07-02 NOTE — Telephone Encounter (Signed)
Patient notified as instructed by telephone. 

## 2011-07-02 NOTE — Telephone Encounter (Signed)
Patient called and stated her cardiologist suggested to go back on Crestor and cut it back to 10mg  every other day.  Please advise.

## 2011-07-09 ENCOUNTER — Encounter: Payer: Self-pay | Admitting: Internal Medicine

## 2011-07-16 ENCOUNTER — Other Ambulatory Visit: Payer: Self-pay | Admitting: Cardiovascular Disease

## 2011-07-17 ENCOUNTER — Ambulatory Visit (INDEPENDENT_AMBULATORY_CARE_PROVIDER_SITE_OTHER): Payer: Medicare Other | Admitting: Family Medicine

## 2011-07-17 ENCOUNTER — Encounter: Payer: Self-pay | Admitting: Family Medicine

## 2011-07-17 VITALS — BP 120/70 | HR 98 | Temp 97.9°F | Ht 65.5 in | Wt 234.5 lb

## 2011-07-17 DIAGNOSIS — N390 Urinary tract infection, site not specified: Secondary | ICD-10-CM

## 2011-07-17 LAB — POCT URINALYSIS DIPSTICK
Ketones, UA: NEGATIVE
Protein, UA: 30
Spec Grav, UA: 1.025
pH, UA: 5

## 2011-07-17 MED ORDER — CIPROFLOXACIN HCL 500 MG PO TABS: 500.0000 mg | ORAL_TABLET | Freq: Two times a day (BID) | ORAL | Status: AC

## 2011-07-17 NOTE — Progress Notes (Addendum)
SUBJECTIVE: Kelli Velasquez is a 70 y.o. female of Dr. Milinda Antis who complains of urinary frequency, urgency and dysuria x 1 days, without flank pain, fever, chills, or abnormal vaginal discharge or bleeding.   She has h/o recurrent UTIs, none within past year.  Patient Active Problem List  Diagnoses  . HYPERLIPIDEMIA  . OBESITY  . MYOPIA  . HYPERTENSION  . MYOCARDIAL INFARCTION, SUBENDOCARDIAL, INITIAL EPISODE  . CAD, NATIVE VESSEL  . ALLERGIC RHINITIS  . ASTHMA  . OVERACTIVE BLADDER  . OSTEOARTHRITIS  . URINARY INCONTINENCE  . HYPERGLYCEMIA  . DYSPHONIA  . Preseptal cellulitis  . Special screening for malignant neoplasms, colon  . Other screening mammogram   Past Medical History  Diagnosis Date  . Myocardial infarction     subendocardial, initial episode  . Hyperlipidemia   . Hypertension   . Obesity   . Neoplasm of skin     neoplasm of uncertain behavior of skin  . Vertigo   . Myopia   . Overactive bladder   . Urinary incontinence   . Osteoarthritis   . Asthma   . Allergy     allergic rhinitis  . Rash     and other non specific skin eruptions   Past Surgical History  Procedure Date  . Cholecystectomy   . Joint replacement     right total knee replacement  . Coronary angioplasty with stent placement 07/2009  . 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 Smoking status: Former Games developer  . Smokeless tobacco: Not on file  . Alcohol Use: Yes   Family History  Problem Relation Age of Onset  . Stroke Mother   . Stroke Father    Allergies  Allergen Reactions  . Aspirin     REACTION: nausea and vomiting  . Atorvastatin     REACTION: leg pain  . Fluticasone Propionate     REACTION: not effective  . Simvastatin     REACTION: muscle pain  . Trandolapril     REACTION: leg pain   Current Outpatient Prescriptions on File Prior to Visit  Medication Sig Dispense Refill  . acetaminophen (TYLENOL) 500 MG  tablet Take 1,000 mg by mouth 2 (two) times daily.        Marland Kitchen amLODipine (NORVASC) 10 MG tablet TAKE 1 TABLET EVERY DAY  30 tablet  11  . Ascorbic Acid (VITAMIN C) 1000 MG tablet Take 1,000 mg by mouth daily.        Marland Kitchen aspirin 81 MG tablet Take 81 mg by mouth daily.        . calcium-vitamin D (OSCAL WITH D 500-200) 500-200 MG-UNIT per tablet Take 1 tablet by mouth daily.        . chlorpheniramine (CHLOR-TRIMETON) 4 MG tablet Take 4 mg by mouth 2 (two) times daily as needed.        . clopidogrel (PLAVIX) 75 MG tablet Take 75 mg by mouth daily.        . clotrimazole-betamethasone (LOTRISONE) cream Apply 1 application topically 2 (two) times daily. As needed  30 g  0  . cromolyn (NASALCROM) 5.2 MG/ACT nasal spray Place 2 sprays into the nose daily.        . Flaxseed, Linseed, (RA FLAX SEED OIL 1000) 1000 MG CAPS One capsule by mouth daily       . metoprolol tartrate (LOPRESSOR) 25 MG tablet TAKE 1 TABLET BY MOUTH TWICE  A DAY  60 tablet  9  . mometasone (NASONEX) 50 MCG/ACT nasal spray 2 sprays in each nostril once daily       . nitroGLYCERIN (NITROSTAT) 0.4 MG SL tablet Take sublingual as directed as needed.       Marland Kitchen omeprazole (PRILOSEC OTC) 20 MG tablet Take 20 mg by mouth daily.       . rosuvastatin (CRESTOR) 10 MG tablet Take one tablet by mouth every other day  15 tablet  0  . solifenacin (VESICARE) 10 MG tablet Take 10 mg by mouth every other day.        The PMH, PSH, Social History, Family History, Medications, and allergies have been reviewed in [facility], and have been updated if relevant.   OBJECTIVE: BP 120/70  Pulse 98  Temp(Src) 97.9 F (36.6 C) (Oral)  Ht 5' 5.5" (1.664 m)  Wt 234 lb 8 oz (106.369 kg)  BMI 38.43 kg/m2  LMP 07/27/1980   Appears well, in no apparent distress.  Vital signs are normal. The abdomen is soft without tenderness, guarding, mass, rebound or organomegaly. No CVA tenderness or inguinal adenopathy noted. Urine dipstick shows positive for RBC's, positive for protein  and positive for leukocytes.    ASSESSMENT: UTI uncomplicated without evidence of pyelonephritis  PLAN: Treatment per orders - cipro 500 mg twice daily x 3 days, also push fluids, may use Pyridium OTC prn. Will send urine for cx since she has recurrent UTIs and allergies. Call or return to clinic prn if these symptoms worsen or fail to improve as anticipated.

## 2011-07-17 NOTE — Progress Notes (Signed)
Addended by: Gilmer Mor on: 07/17/2011 04:14 PM   Modules accepted: Orders

## 2011-07-17 NOTE — Patient Instructions (Signed)
Nice to see you. I hope you have a wonderful holiday. We will send your urine for culture. Go ahead and start taking the cipro as directed. Continue drinking plenty of fluids and ok to continue Azo as needed.

## 2011-07-24 ENCOUNTER — Encounter: Payer: Self-pay | Admitting: *Deleted

## 2011-07-30 ENCOUNTER — Other Ambulatory Visit: Payer: Medicare Other | Admitting: Internal Medicine

## 2011-07-31 ENCOUNTER — Encounter: Payer: Self-pay | Admitting: Internal Medicine

## 2011-07-31 ENCOUNTER — Ambulatory Visit (INDEPENDENT_AMBULATORY_CARE_PROVIDER_SITE_OTHER): Payer: Medicare Other | Admitting: Internal Medicine

## 2011-07-31 DIAGNOSIS — D689 Coagulation defect, unspecified: Secondary | ICD-10-CM

## 2011-07-31 DIAGNOSIS — Z1211 Encounter for screening for malignant neoplasm of colon: Secondary | ICD-10-CM

## 2011-07-31 MED ORDER — PEG-KCL-NACL-NASULF-NA ASC-C 100 G PO SOLR: 1.0000 | Freq: Once | ORAL | Status: DC

## 2011-07-31 NOTE — Progress Notes (Signed)
Kelli Velasquez 10/18/1940 MRN [id number]    History of Present Illness:  This is a 71 year old white female who is here to discuss having a screening colonoscopy. She has coronary artery disease and had 2 drug-eluting stents placed in January 2011. She has been on Plavix and will stay on it for the foreseeable future. Dr Clifton James is her cardiologist. She denies any upper GI symptoms such as constipation, diarrhea or rectal bleeding. There is no family history of colon cancer. Her husband had a colonoscopy and she is familiar with the prep. She had a total knee replacement 1 year ago and was taken off Plavix for 5 days prior to the procedure.   Past Medical History  Diagnosis Date  . Myocardial infarction     subendocardial, initial episode  . Hyperlipidemia   . Hypertension   . Obesity   . Neoplasm of skin     neoplasm of uncertain behavior of skin  . Vertigo   . Myopia   . Overactive bladder   . Urinary incontinence   . Osteoarthritis   . Asthma   . Allergy     allergic rhinitis  . Rash     and other non specific skin eruptions   Past Surgical History  Procedure Date  . Cholecystectomy   . Joint replacement     right total knee replacement  . Coronary angioplasty with stent placement 07/2009  . Total knee arthroplasty     left , then vocal cord infection post op  . Vocal cord polypectomy     reports that she has quit smoking. She has never used smokeless tobacco. She reports that she does not drink alcohol or use illicit drugs. family history includes Breast cancer in her maternal grandmother; Diabetes in an unspecified family member; and Stroke in her father and mother.  There is no history of Colon cancer. Allergies  Allergen Reactions  . Shellfish Allergy Anaphylaxis  . Aspirin     REACTION: nausea and vomiting  . Atorvastatin     REACTION: leg pain  . Fish Allergy   . Fluticasone Propionate     REACTION: not effective  . Simvastatin     REACTION: muscle pain  .  Trandolapril     REACTION: leg pain        Review of Systems: Occasional acid reflux. She takes Prilosec 20 mg daily. Denies abdominal pain or change in bowel habits  The remainder of the 10 point ROS is negative except as outlined in H&P   Physical Exam: General appearance  Well developed, in no distress. Eyes- non icteric. HEENT nontraumatic, normocephalic. Mouth no lesions, tongue papillated, no cheilosis. Neck supple without adenopathy, thyroid not enlarged, no carotid bruits, no JVD. Lungs Clear to auscultation bilaterally. Cor normal S1, normal S2, regular rhythm, no murmur,  quiet precordium. Abdomen: Mildly obese. Soft nontender with normal active bowel sounds. Rectal: Not done Extremities no pedal edema. Skin no lesions. Neurological alert and oriented x 3. Psychological normal mood and affect.  Assessment and Plan:  Problem #57 71 year old white female who has never had a colonoscopy and is an excellent candidate for a screening colonoscopy. She will continue on Plavix through the colonoscopy prep and the exam. We have discussed the risks of staying on Plavix as well as benefits as well. We have discussed conscious sedation as well the procedure and the prep. She will be scheduled in the near future.   07/31/2011 Lina Sar

## 2011-07-31 NOTE — Patient Instructions (Addendum)
You have been scheduled for a colonoscopy. Please follow written instructions given to you at your visit today.  Please pick up your prep kit at the pharmacy within the next 2-3 days. Continue Plavix per Dr Lina Sar CC: Dr Roxy Manns, Dr Clifton James

## 2011-08-03 ENCOUNTER — Other Ambulatory Visit: Payer: Self-pay | Admitting: Cardiovascular Disease

## 2011-08-18 ENCOUNTER — Ambulatory Visit (AMBULATORY_SURGERY_CENTER): Payer: Medicare Other | Admitting: Internal Medicine

## 2011-08-18 ENCOUNTER — Encounter: Payer: Self-pay | Admitting: Internal Medicine

## 2011-08-18 VITALS — BP 153/68 | HR 83 | Temp 98.1°F | Resp 17 | Ht 66.0 in | Wt 231.0 lb

## 2011-08-18 DIAGNOSIS — Z1211 Encounter for screening for malignant neoplasm of colon: Secondary | ICD-10-CM

## 2011-08-18 MED ORDER — SODIUM CHLORIDE 0.9 % IV SOLN: 500.0000 mL | INTRAVENOUS | Status: DC

## 2011-08-18 NOTE — Progress Notes (Signed)
Patient did not experience any of the following events: a burn prior to discharge; a fall within the facility; wrong site/side/patient/procedure/implant event; or a hospital transfer or hospital admission upon discharge from the facility. (G8907) Patient did not have preoperative order for IV antibiotic SSI prophylaxis. (G8918)  

## 2011-08-18 NOTE — Patient Instructions (Signed)
Please refer to blue and green discharge instruction sheets. 

## 2011-08-18 NOTE — Op Note (Signed)
[facility] [address] [place], [place]  [address]  COLONOSCOPY PROCEDURE REPORT  PATIENT:  Kelli Velasquez, Kelli Velasquez  MR#:  [id number] BIRTHDATE:  17-Jan-1941, 70 yrs. old  GENDER:  female ENDOSCOPIST:  Hedwig Morton. Juanda Chance, MD REF. BY:  Marne A. Milinda Antis, M.D. PROCEDURE DATE:  08/18/2011 PROCEDURE:  Colonoscopy 19147 ASA CLASS:  Class II INDICATIONS:  Screening MEDICATIONS:   These medications were titrated to patient response per physician's verbal order, Versed 7 mg, Fentanyl 75 mcg  DESCRIPTION OF PROCEDURE:   After the risks and benefits and of the procedure were explained, informed consent was obtained. Digital rectal exam was performed and revealed no rectal masses. The LB [id number]-[id number] endoscope was introduced through the anus and advanced to the cecum, which was identified by both the appendix and ileocecal valve.  The quality of the prep was good, using MoviPrep.  The instrument was then slowly withdrawn as the colon was fully examined. <<PROCEDUREIMAGES>>  FINDINGS:  No polyps or cancers were seen (see image1, image2, and image3).   Retroflexed views in the rectum revealed no abnormalities.    The scope was then withdrawn from the patient and the procedure completed.  COMPLICATIONS:  None ENDOSCOPIC IMPRESSION: 1) No polyps or cancers 2) Normal colonoscopy RECOMMENDATIONS: 1) High fiber diet.  REPEAT EXAM:  In 10 year(s) for.  ______________________________ Hedwig Morton. Juanda Chance, MD  CC:  n. eSIGNED:   Hedwig Morton. Miria Cappelli at 08/18/2011 04:48 PM  Griffith Citron, [phone]

## 2011-08-19 ENCOUNTER — Telehealth: Payer: Self-pay | Admitting: *Deleted

## 2011-08-19 NOTE — Telephone Encounter (Signed)

## 2011-09-21 ENCOUNTER — Telehealth: Payer: Self-pay | Admitting: Cardiovascular Disease

## 2011-09-21 DIAGNOSIS — E785 Hyperlipidemia, unspecified: Secondary | ICD-10-CM

## 2011-09-21 MED ORDER — EZETIMIBE 10 MG PO TABS: 10.0000 mg | ORAL_TABLET | Freq: Every day | ORAL | Status: DC

## 2011-09-21 NOTE — Telephone Encounter (Signed)
Can we try Zetia 10 mg po Qdaily? Thanks, chris

## 2011-09-21 NOTE — Telephone Encounter (Signed)
Pt stopped Crestor 2 weeks ago due to muscle aches. Muscle aches improved 2 days after stopping Crestor.  Has been unable to tolerate Zocor and Lipitor in past.  Will review with Dr. Clifton James

## 2011-09-21 NOTE — Telephone Encounter (Signed)
New msg Pt is taking 5 mg of crestor. She said she has been having muscle pains. She has stopped taking this med. Please call

## 2011-09-21 NOTE — Telephone Encounter (Signed)
Spoke with pt and gave her recommendations from Dr. Clifton James. She is willing to try Zetia and will let us know if she has problems tolerating it.  Will send prescription to [facility] on [address] In [place].

## 2011-11-02 ENCOUNTER — Other Ambulatory Visit: Payer: Self-pay | Admitting: Family Medicine

## 2011-11-02 ENCOUNTER — Telehealth: Payer: Self-pay | Admitting: Cardiovascular Disease

## 2011-11-02 ENCOUNTER — Telehealth: Payer: Self-pay | Admitting: Family Medicine

## 2011-11-02 NOTE — Telephone Encounter (Signed)
Will forward to MD for his knowledge. 

## 2011-11-02 NOTE — Telephone Encounter (Signed)
Patient has been put on two different meds.  They are ZETIA 10ml and MYRBETRIQ 50ml.  She wants to know how soon she needs to make an appt to have labs drawn for her cholesterol.

## 2011-11-02 NOTE — Telephone Encounter (Signed)
New Problem:     Patient called in to report that other Dr.'s have taken her off of Crestor and placed her on Zetia and took her off of Vesicare and placed her on Myrbetriq.  Please call if you have any questions.

## 2011-11-02 NOTE — Telephone Encounter (Signed)
Patient advised as instructed via telephone. 

## 2011-11-02 NOTE — Telephone Encounter (Signed)
4-6 weeks please fasting if able

## 2011-11-03 NOTE — Telephone Encounter (Signed)
Noted. Thanks. cdm

## 2011-11-04 ENCOUNTER — Encounter: Payer: Self-pay | Admitting: Family Medicine

## 2011-11-04 ENCOUNTER — Ambulatory Visit (INDEPENDENT_AMBULATORY_CARE_PROVIDER_SITE_OTHER): Payer: Medicare Other | Admitting: Family Medicine

## 2011-11-04 VITALS — BP 130/70 | HR 80 | Temp 98.1°F | Ht 65.5 in | Wt 235.2 lb

## 2011-11-04 DIAGNOSIS — J4489 Other specified chronic obstructive pulmonary disease: Secondary | ICD-10-CM | POA: Insufficient documentation

## 2011-11-04 DIAGNOSIS — J449 Chronic obstructive pulmonary disease, unspecified: Secondary | ICD-10-CM

## 2011-11-04 MED ORDER — PREDNISONE 10 MG PO TABS: ORAL_TABLET | ORAL | Status: DC

## 2011-11-04 MED ORDER — ALBUTEROL SULFATE HFA 108 (90 BASE) MCG/ACT IN AERS: 2.0000 | INHALATION_SPRAY | RESPIRATORY_TRACT | Status: DC | PRN

## 2011-11-04 MED ORDER — AZITHROMYCIN 250 MG PO TABS: ORAL_TABLET | ORAL | Status: DC

## 2011-11-04 NOTE — Progress Notes (Signed)
Subjective:    Patient ID: Kelli Velasquez, female    DOB: 29-Aug-1940, 71 y.o.   MRN: [id number]  HPI Started getting sick on Sunday Slept Monday and Tuesday  Does not think she had fever Bad cough - and some wheezing -- does not have albuterol  Is coughing up some white/ brown mucous / yellow   Some ear fullness/ they pound Nose is a little runny No sinus pain or st   Husband has same symptoms  Patient Active Problem List  Diagnoses  . HYPERLIPIDEMIA  . OBESITY  . MYOPIA  . HYPERTENSION  . MYOCARDIAL INFARCTION, SUBENDOCARDIAL, INITIAL EPISODE  . CAD, NATIVE VESSEL  . ALLERGIC RHINITIS  . ASTHMA  . OVERACTIVE BLADDER  . OSTEOARTHRITIS  . URINARY INCONTINENCE  . HYPERGLYCEMIA  . DYSPHONIA  . Preseptal cellulitis  . Special screening for malignant neoplasms, colon  . Other screening mammogram  . Bronchitis with airway obstruction   Past Medical History  Diagnosis Date  . Myocardial infarction     subendocardial, initial episode  . Hyperlipidemia   . Hypertension   . Obesity   . Neoplasm of skin     neoplasm of uncertain behavior of skin  . Vertigo   . Myopia   . Overactive bladder   . Urinary incontinence   . Osteoarthritis   . Asthma   . Allergy     allergic rhinitis  . Rash     and other non specific skin eruptions  . Gallstones    Past Surgical History  Procedure Date  . Cholecystectomy   . Joint replacement     right total knee replacement  . Coronary angioplasty with stent placement 07/2009  . Total knee arthroplasty     left , then vocal cord infection post op  . Vocal cord polypectomy    History  Substance Use Topics  . Smoking status: Former Smoker    Types: Cigarettes    Quit date: 07/27/1982  . Smokeless tobacco: Never Used  . Alcohol Use: No   Family History  Problem Relation Age of Onset  . Stroke Mother   . Stroke Father   . Colon cancer Neg Hx   . Breast cancer Maternal Grandmother   . Diabetes Mother 62   Allergies    Allergen Reactions  . Shellfish Allergy Anaphylaxis  . Aspirin     REACTION: nausea and vomiting  . Atorvastatin     REACTION: leg pain  . Crestor (Rosuvastatin Calcium) Other (See Comments)    Muscle pain - allergy/intolerance  . Fish Allergy   . Fluticasone Propionate     REACTION: not effective  . Simvastatin     REACTION: muscle pain  . Trandolapril     REACTION: leg pain   Current Outpatient Prescriptions on File Prior to Visit  Medication Sig Dispense Refill  . acetaminophen (TYLENOL) 500 MG tablet Take 1,000 mg by mouth 2 (two) times daily.        Marland Kitchen amLODipine (NORVASC) 10 MG tablet TAKE 1 TABLET EVERY DAY  30 tablet  11  . Ascorbic Acid (VITAMIN C) 1000 MG tablet Take 1,000 mg by mouth daily.        Marland Kitchen aspirin 81 MG tablet Take 81 mg by mouth daily.        . calcium-vitamin D (OSCAL WITH D 500-200) 500-200 MG-UNIT per tablet Take 1 tablet by mouth daily.        . chlorpheniramine (CHLOR-TRIMETON) 4 MG tablet Take 4 mg by  mouth 2 (two) times daily as needed.        . clopidogrel (PLAVIX) 75 MG tablet TAKE 1 TABLET BY MOUTH EVERY DAY WITH FOOD  30 tablet  10  . cromolyn (NASALCROM) 5.2 MG/ACT nasal spray Place 2 sprays into the nose daily.        Marland Kitchen ezetimibe (ZETIA) 10 MG tablet Take 1 tablet (10 mg total) by mouth daily.  30 tablet  11  . Flaxseed, Linseed, (RA FLAX SEED OIL 1000) 1000 MG CAPS One capsule by mouth daily       . metoprolol tartrate (LOPRESSOR) 25 MG tablet TAKE 1 TABLET BY MOUTH TWICE A DAY  60 tablet  9  . Mirabegron ER (MYRBETRIQ) 50 MG TB24 Take 1 tablet by mouth daily.      . nitroGLYCERIN (NITROSTAT) 0.4 MG SL tablet Take sublingual as directed as needed.       Marland Kitchen omeprazole (PRILOSEC OTC) 20 MG tablet Take 20 mg by mouth daily.       Marland Kitchen albuterol (PROVENTIL HFA;VENTOLIN HFA) 108 (90 BASE) MCG/ACT inhaler Inhale 2 puffs into the lungs every 4 (four) hours as needed for wheezing or shortness of breath.  1 Inhaler  1     Review of Systems Review of Systems   Constitutional: Negative for fever, appetite change, and unexpected weight change.  Eyes: Negative for pain and visual disturbance.  ENt pos for congestion/ neg for sinus pain  Respiratory: Negative for sob/ pos for cough and wheeze  Cardiovascular: Negative for cp or palpitations    Gastrointestinal: Negative for nausea, diarrhea and constipation.  Genitourinary: Negative for urgency and frequency.  Skin: Negative for pallor or rash   Neurological: Negative for weakness, light-headedness, numbness and headaches.  Hematological: Negative for adenopathy. Does not bruise/bleed easily.  Psychiatric/Behavioral: Negative for dysphoric mood. The patient is not nervous/anxious.          Objective:   Physical Exam  Constitutional: She appears well-developed and well-nourished. No distress.  HENT:  Head: Normocephalic and atraumatic.  Right Ear: External ear normal.  Left Ear: External ear normal.  Mouth/Throat: Oropharynx is clear and moist. No oropharyngeal exudate.       Nares are injected and congested  No sinus tenderness Post nasal drip  TMs are dull  Eyes: Conjunctivae and EOM are normal. Pupils are equal, round, and reactive to light. Right eye exhibits no discharge. Left eye exhibits no discharge.  Neck: Normal range of motion. Neck supple. No JVD present. Carotid bruit is not present. No thyromegaly present.  Cardiovascular: Normal rate and regular rhythm.   Pulmonary/Chest: Effort normal. No respiratory distress. She has wheezes. She has no rales. She exhibits no tenderness.       Mild exp wheeze and harsh bs No rales/ rhonchi  No prolonged exp phase   Lymphadenopathy:    She has no cervical adenopathy.  Skin: Skin is warm and dry. No rash noted. No erythema. No pallor.  Psychiatric: She has a normal mood and affect.          Assessment & Plan:

## 2011-11-04 NOTE — Patient Instructions (Signed)
Drink lots of fluids and get rest For asthmatic bronchitis take the zpak and prednisone as directed Inhaler if needed Update if not starting to improve in a week or if worsening

## 2011-11-04 NOTE — Assessment & Plan Note (Signed)
In pt who had asthma as a child  tx with zpak and pred taper  Will continue mucinex for cough  Re assuring exam Disc symptomatic care - see instructions on AVS  Update if not starting to improve in a week or if worsening

## 2011-11-10 ENCOUNTER — Ambulatory Visit (INDEPENDENT_AMBULATORY_CARE_PROVIDER_SITE_OTHER): Payer: Medicare Other | Admitting: Internal Medicine

## 2011-11-10 ENCOUNTER — Telehealth: Payer: Self-pay | Admitting: Family Medicine

## 2011-11-10 ENCOUNTER — Encounter: Payer: Self-pay | Admitting: Internal Medicine

## 2011-11-10 VITALS — BP 120/80 | HR 97 | Temp 97.9°F | Resp 12 | Wt 236.0 lb

## 2011-11-10 DIAGNOSIS — J449 Chronic obstructive pulmonary disease, unspecified: Secondary | ICD-10-CM

## 2011-11-10 MED ORDER — PREDNISONE 20 MG PO TABS: 40.0000 mg | ORAL_TABLET | Freq: Every day | ORAL | Status: AC

## 2011-11-10 MED ORDER — HYDROCODONE-HOMATROPINE 5-1.5 MG/5ML PO SYRP: 5.0000 mL | ORAL_SOLUTION | Freq: Every evening | ORAL | Status: AC | PRN

## 2011-11-10 MED ORDER — LEVOFLOXACIN 500 MG PO TABS: 500.0000 mg | ORAL_TABLET | Freq: Every day | ORAL | Status: AC

## 2011-11-10 NOTE — Assessment & Plan Note (Signed)
Not better Stopped mucinex---made cough worse  Will try levaquin Prolong the prednisone Hydrocodone cough syrup

## 2011-11-10 NOTE — Progress Notes (Signed)
Subjective:    Patient ID: Kelli Velasquez, female    DOB: April 22, 1941, 71 y.o.   MRN: [id number]  HPI Doesn't feel any better  Awake all night with wheezing Productive cough--thick cream mucus Now with rib pain due to the hard cough Still finishing the pred pack and took the z-pak Prednisone seemed to help at first but not at lower doses  No fever Doesn't feel SOB Cough is in day also Doesn't feel in chest now---more PND now  Current Outpatient Prescriptions on File Prior to Visit  Medication Sig Dispense Refill  . acetaminophen (TYLENOL) 500 MG tablet Take 1,000 mg by mouth 2 (two) times daily.        Marland Kitchen albuterol (PROVENTIL HFA;VENTOLIN HFA) 108 (90 BASE) MCG/ACT inhaler Inhale 2 puffs into the lungs every 4 (four) hours as needed for wheezing or shortness of breath.  1 Inhaler  1  . amLODipine (NORVASC) 10 MG tablet TAKE 1 TABLET EVERY DAY  30 tablet  11  . Ascorbic Acid (VITAMIN C) 1000 MG tablet Take 1,000 mg by mouth daily.        Marland Kitchen aspirin 81 MG tablet Take 81 mg by mouth daily.        . calcium-vitamin D (OSCAL WITH D 500-200) 500-200 MG-UNIT per tablet Take 1 tablet by mouth daily.        . chlorpheniramine (CHLOR-TRIMETON) 4 MG tablet Take 4 mg by mouth 2 (two) times daily as needed.        . clopidogrel (PLAVIX) 75 MG tablet TAKE 1 TABLET BY MOUTH EVERY DAY WITH FOOD  30 tablet  10  . cromolyn (NASALCROM) 5.2 MG/ACT nasal spray Place 2 sprays into the nose daily.        Marland Kitchen ezetimibe (ZETIA) 10 MG tablet Take 1 tablet (10 mg total) by mouth daily.  30 tablet  11  . Flaxseed, Linseed, (RA FLAX SEED OIL 1000) 1000 MG CAPS One capsule by mouth daily       . metoprolol tartrate (LOPRESSOR) 25 MG tablet TAKE 1 TABLET BY MOUTH TWICE A DAY  60 tablet  9  . Mirabegron ER (MYRBETRIQ) 50 MG TB24 Take 1 tablet by mouth daily.      . nitroGLYCERIN (NITROSTAT) 0.4 MG SL tablet Take sublingual as directed as needed.       Marland Kitchen omeprazole (PRILOSEC OTC) 20 MG tablet Take 20 mg by mouth daily.        . predniSONE (DELTASONE) 10 MG tablet Take 3 tabs by mouth once daily for 3 days and then 2 pills daily for 3 days and then 1 pill daily for 3 days then stop  18 tablet  0    Allergies  Allergen Reactions  . Shellfish Allergy Anaphylaxis  . Aspirin     REACTION: nausea and vomiting  . Atorvastatin     REACTION: leg pain  . Crestor (Rosuvastatin Calcium) Other (See Comments)    Muscle pain - allergy/intolerance  . Fish Allergy   . Fluticasone Propionate     REACTION: not effective  . Simvastatin     REACTION: muscle pain  . Trandolapril     REACTION: leg pain    Past Medical History  Diagnosis Date  . Myocardial infarction     subendocardial, initial episode  . Hyperlipidemia   . Hypertension   . Obesity   . Neoplasm of skin     neoplasm of uncertain behavior of skin  . Vertigo   . Myopia   .  Overactive bladder   . Urinary incontinence   . Osteoarthritis   . Asthma   . Allergy     allergic rhinitis  . Rash     and other non specific skin eruptions  . Gallstones     Past Surgical History  Procedure Date  . Cholecystectomy   . Joint replacement     right total knee replacement  . Coronary angioplasty with stent placement 07/2009  . Total knee arthroplasty     left , then vocal cord infection post op  . Vocal cord polypectomy     Family History  Problem Relation Age of Onset  . Stroke Mother   . Stroke Father   . Colon cancer Neg Hx   . Breast cancer Maternal Grandmother   . Diabetes Mother 15    History   Social History  . Marital Status: Married    Spouse Name: N/A    Number of Children: N/A  . Years of Education: N/A   Occupational History  . Not on file.   Social History Main Topics  . Smoking status: Former Smoker    Types: Cigarettes    Quit date: 07/27/1982  . Smokeless tobacco: Never Used  . Alcohol Use: No  . Drug Use: No  . Sexually Active: Not on file   Other Topics Concern  . Not on file   Social History Narrative  . No  narrative on file   Review of Systems No rash No vomiting but had 1-2 loose stools at first on z-pak Appetite is okay    Objective:   Physical Exam  Constitutional: She appears well-developed and well-nourished. No distress.  HENT:  Mouth/Throat: Oropharynx is clear and moist. No oropharyngeal exudate.       No sinus tenderness Moderate nasal congestion TMs normal  Neck: Normal range of motion. Neck supple.  Pulmonary/Chest: Effort normal. No respiratory distress. She has no wheezes. She has no rales.       Slight rhonchi Normal exp phase  Lymphadenopathy:    She has no cervical adenopathy.          Assessment & Plan:

## 2011-11-10 NOTE — Telephone Encounter (Signed)
Caller: [place]/Other; PCP: Tower, Marne A.; CB#: [phone]; ; ; Call regarding Cough/Congestion;  Pt is calling to say her cough has worsened overnight. She was seen last week on Wed 11/04/11 Dr. Milinda Antis. Pt states last night her wheezing worsened and is not responding to Albuterol. Pt had to sit up in the chair all night coughing. Rn advised appt for f/u - no appts available with Dr. Milinda Antis. Pt scheduled today with Dr. Alphonsus Sias at 12:30

## 2011-11-26 ENCOUNTER — Telehealth: Payer: Self-pay | Admitting: Cardiovascular Disease

## 2011-12-06 ENCOUNTER — Telehealth: Payer: Self-pay | Admitting: Family Medicine

## 2011-12-06 DIAGNOSIS — E785 Hyperlipidemia, unspecified: Secondary | ICD-10-CM

## 2011-12-06 DIAGNOSIS — I10 Essential (primary) hypertension: Secondary | ICD-10-CM

## 2011-12-06 DIAGNOSIS — R7309 Other abnormal glucose: Secondary | ICD-10-CM

## 2011-12-06 NOTE — Telephone Encounter (Signed)
Message copied by Judy Pimple on Sun Dec 06, 2011  6:45 PM ------      Message from: Baldomero Lamy      Created: Wed Dec 02, 2011 10:14 AM      Regarding: Cpx labs Mon 5/13       Please order  future f/u labs for pt's upcomming lab appt.      Thanks      Rodney Booze

## 2011-12-07 ENCOUNTER — Other Ambulatory Visit (INDEPENDENT_AMBULATORY_CARE_PROVIDER_SITE_OTHER): Payer: Medicare Other

## 2011-12-07 DIAGNOSIS — R7309 Other abnormal glucose: Secondary | ICD-10-CM

## 2011-12-07 DIAGNOSIS — E785 Hyperlipidemia, unspecified: Secondary | ICD-10-CM

## 2011-12-07 DIAGNOSIS — I10 Essential (primary) hypertension: Secondary | ICD-10-CM

## 2011-12-07 LAB — TSH: TSH: 1.2 u[IU]/mL (ref 0.35–5.50)

## 2011-12-07 LAB — COMPREHENSIVE METABOLIC PANEL
CO2: 27 mEq/L (ref 19–32)
Calcium: 8.7 mg/dL (ref 8.4–10.5)
Chloride: 101 mEq/L (ref 96–112)
Creatinine, Ser: 0.9 mg/dL (ref 0.4–1.2)
GFR: 63.81 mL/min (ref >60.00)
Glucose, Bld: 98 mg/dL (ref 70–99)
Sodium: 138 mEq/L (ref 135–145)
Total Bilirubin: 0.2 mg/dL — ABNORMAL LOW (ref 0.3–1.2)
Total Protein: 6.9 g/dL (ref 6.0–8.3)

## 2011-12-07 LAB — CBC WITH DIFFERENTIAL/PLATELET
Eosinophils Relative: 3.5 % (ref 0.0–5.0)
HCT: 40.6 % (ref 36.0–46.0)
Hemoglobin: 13.4 g/dL (ref 12.0–15.0)
Lymphs Abs: 1.9 10*3/uL (ref 0.7–4.0)
Monocytes Relative: 7.1 % (ref 3.0–12.0)
Neutro Abs: 6 10*3/uL (ref 1.4–7.7)
WBC: 8.9 10*3/uL (ref 4.5–10.5)

## 2011-12-07 LAB — LIPID PANEL: HDL: 47.4 mg/dL (ref >39.00)

## 2011-12-07 LAB — HEMOGLOBIN A1C: Hgb A1c MFr Bld: 5.5 % (ref 4.6–6.5)

## 2011-12-11 ENCOUNTER — Encounter: Payer: Self-pay | Admitting: Family Medicine

## 2011-12-11 ENCOUNTER — Ambulatory Visit (INDEPENDENT_AMBULATORY_CARE_PROVIDER_SITE_OTHER): Payer: Medicare Other | Admitting: Family Medicine

## 2011-12-11 VITALS — BP 120/62 | HR 76 | Temp 98.7°F | Ht 65.5 in | Wt 238.5 lb

## 2011-12-11 DIAGNOSIS — I10 Essential (primary) hypertension: Secondary | ICD-10-CM

## 2011-12-11 DIAGNOSIS — E785 Hyperlipidemia, unspecified: Secondary | ICD-10-CM

## 2011-12-11 DIAGNOSIS — E669 Obesity, unspecified: Secondary | ICD-10-CM

## 2011-12-11 DIAGNOSIS — R7309 Other abnormal glucose: Secondary | ICD-10-CM

## 2011-12-11 NOTE — Assessment & Plan Note (Addendum)
bp in fair control at this time  No changes needed  Disc lifstyle change with low sodium diet and exercise   F.u 6 mo  Made plan for exercise

## 2011-12-11 NOTE — Patient Instructions (Signed)
Keep working on healthy diet and exercise  Make sure you stretch well - and keep up the good work Consider cutting portions by 1/4 also for weight loss with exercise 5 days per week  If you are interested in a shingles/zoster vaccine - call your insurance to check on coverage,( you should not get it within 1 month of other vaccines) , then call us for a prescription  for it to take to a pharmacy that gives the shot    Follow up in 6 months for annual exam with labs prior

## 2011-12-11 NOTE — Assessment & Plan Note (Signed)
This is suprisingly well controlled by zetia and diet in statin intol pt Disc goals for lipids and reasons to control them Rev labs with pt Rev low sat fat diet in detail

## 2011-12-11 NOTE — Progress Notes (Signed)
Subjective:    Patient ID: Kelli Velasquez, female    DOB: 1940/08/21, 71 y.o.   MRN: [id number]  HPI Here for f/u of chronic conditions Is doing well  The bronchitis is much better -just a little cough yet    bp is good      Today BP Readings from Last 3 Encounters:  12/11/11 120/62  11/10/11 120/80  11/04/11 130/70    No cp or palpitations or headaches or edema  No side effects to medicines    Wt is up 2 lb with bmi of 9 Being sick made her not give much of wt loss effort -now is back on track People brought her food  Diet - eating more vegetables, less red meat , more lean protein like chicken  Biggest meal is lunch -- is careful with her  Dinner - not a lot of carbs Exercise-- just got back to the gym on Monday , missed today so will go for a walk and pick some strawberries   Hyperglycemia 5.5 a1c  Lipids- on zetia Lab Results  Component Value Date   CHOL 159 12/07/2011   CHOL 122 06/09/2011   CHOL 147 11/25/2010   Lab Results  Component Value Date   HDL 47.40 12/07/2011   HDL 96.29 06/09/2011   HDL 49.90 11/25/2010   Lab Results  Component Value Date   LDLCALC 81 12/07/2011   LDLCALC 51 06/09/2011   LDLCALC 78 11/25/2010   Lab Results  Component Value Date   TRIG 154.0* 12/07/2011   TRIG 130.0 06/09/2011   TRIG 96.0 11/25/2010   Lab Results  Component Value Date   CHOLHDL 3 12/07/2011   CHOLHDL 3 06/09/2011   CHOLHDL 3 11/25/2010   Lab Results  Component Value Date   LDLDIRECT 124.8 09/01/2007   LDLDIRECT 132.2 04/11/2007   LDLDIRECT 171.4 03/03/2007   did not tol crestor  zetial alone is working well with diet   Still has to check on shingles vaccine eligibility  Patient Active Problem List  Diagnoses  . HYPERLIPIDEMIA  . OBESITY  . MYOPIA  . HYPERTENSION  . MYOCARDIAL INFARCTION, SUBENDOCARDIAL, INITIAL EPISODE  . CAD, NATIVE VESSEL  . ALLERGIC RHINITIS  . ASTHMA  . OVERACTIVE BLADDER  . OSTEOARTHRITIS  . URINARY INCONTINENCE  . HYPERGLYCEMIA  .  DYSPHONIA  . Preseptal cellulitis  . Special screening for malignant neoplasms, colon  . Other screening mammogram  . Bronchitis with airway obstruction  . Obesity   Past Medical History  Diagnosis Date  . Myocardial infarction     subendocardial, initial episode  . Hyperlipidemia   . Hypertension   . Obesity   . Neoplasm of skin     neoplasm of uncertain behavior of skin  . Vertigo   . Myopia   . Overactive bladder   . Urinary incontinence   . Osteoarthritis   . Asthma   . Allergy     allergic rhinitis  . Rash     and other non specific skin eruptions  . Gallstones    Past Surgical History  Procedure Date  . Cholecystectomy   . Joint replacement     right total knee replacement  . Coronary angioplasty with stent placement 07/2009  . Total knee arthroplasty     left , then vocal cord infection post op  . Vocal cord polypectomy    History  Substance Use Topics  . Smoking status: Former Smoker    Types: Cigarettes    Quit date:  07/27/1982  . Smokeless tobacco: Never Used  . Alcohol Use: No   Family History  Problem Relation Age of Onset  . Stroke Mother   . Stroke Father   . Colon cancer Neg Hx   . Breast cancer Maternal Grandmother   . Diabetes Mother 38   Allergies  Allergen Reactions  . Shellfish Allergy Anaphylaxis  . Aspirin     REACTION: nausea and vomiting  . Atorvastatin     REACTION: leg pain  . Crestor (Rosuvastatin Calcium) Other (See Comments)    Muscle pain - allergy/intolerance  . Fish Allergy   . Fluticasone Propionate     REACTION: not effective  . Simvastatin     REACTION: muscle pain  . Trandolapril     REACTION: leg pain   Current Outpatient Prescriptions on File Prior to Visit  Medication Sig Dispense Refill  . acetaminophen (TYLENOL) 500 MG tablet Take 1,000 mg by mouth 2 (two) times daily.        Marland Kitchen albuterol (PROVENTIL HFA;VENTOLIN HFA) 108 (90 BASE) MCG/ACT inhaler Inhale 2 puffs into the lungs every 4 (four) hours as  needed for wheezing or shortness of breath.  1 Inhaler  1  . amLODipine (NORVASC) 10 MG tablet TAKE 1 TABLET EVERY DAY  30 tablet  11  . Ascorbic Acid (VITAMIN C) 1000 MG tablet Take 1,000 mg by mouth daily.        Marland Kitchen aspirin 81 MG tablet Take 81 mg by mouth daily.        . calcium-vitamin D (OSCAL WITH D 500-200) 500-200 MG-UNIT per tablet Take 1 tablet by mouth daily.        . chlorpheniramine (CHLOR-TRIMETON) 4 MG tablet Take 4 mg by mouth 2 (two) times daily as needed.        . clopidogrel (PLAVIX) 75 MG tablet TAKE 1 TABLET BY MOUTH EVERY DAY WITH FOOD  30 tablet  10  . cromolyn (NASALCROM) 5.2 MG/ACT nasal spray Place 2 sprays into the nose daily.        Marland Kitchen ezetimibe (ZETIA) 10 MG tablet Take 1 tablet (10 mg total) by mouth daily.  30 tablet  11  . Flaxseed, Linseed, (RA FLAX SEED OIL 1000) 1000 MG CAPS One capsule by mouth daily       . metoprolol tartrate (LOPRESSOR) 25 MG tablet TAKE 1 TABLET BY MOUTH TWICE A DAY  60 tablet  9  . Mirabegron ER (MYRBETRIQ) 50 MG TB24 Take 1 tablet by mouth daily.      . nitroGLYCERIN (NITROSTAT) 0.4 MG SL tablet Take sublingual as directed as needed.       Marland Kitchen omeprazole (PRILOSEC OTC) 20 MG tablet Take 20 mg by mouth daily.          Review of Systems Review of Systems  Constitutional: Negative for fever, appetite change, fatigue and unexpected weight change.  Eyes: Negative for pain and visual disturbance.  Respiratory: Negative for cough and shortness of breath.   Cardiovascular: Negative for cp or palpitations    Gastrointestinal: Negative for nausea, diarrhea and constipation.  Genitourinary: Negative for urgency and frequency.  Skin: Negative for pallor or rash   Neurological: Negative for weakness, light-headedness, numbness and headaches.  Hematological: Negative for adenopathy. Does not bruise/bleed easily.  Psychiatric/Behavioral: Negative for dysphoric mood. The patient is not nervous/anxious.         Objective:   Physical Exam    Constitutional: She appears well-developed and well-nourished. No distress.  HENT:  Head: Normocephalic.  Mouth/Throat: Oropharynx is clear and moist.  Eyes: Conjunctivae and EOM are normal. Pupils are equal, round, and reactive to light. No scleral icterus.  Neck: Normal range of motion. Neck supple. No JVD present. Carotid bruit is not present. No thyromegaly present.  Cardiovascular: Normal rate, regular rhythm, normal heart sounds and intact distal pulses.  Exam reveals no gallop.   Pulmonary/Chest: Effort normal and breath sounds normal. No respiratory distress. She has no wheezes.  Abdominal: Soft. Bowel sounds are normal. She exhibits no distension, no abdominal bruit and no mass. There is no tenderness.  Musculoskeletal: Normal range of motion. She exhibits no edema and no tenderness.  Lymphadenopathy:    She has no cervical adenopathy.  Neurological: She is alert. She has normal reflexes. No cranial nerve deficit. She exhibits normal muscle tone. Coordination normal.  Skin: Skin is warm and dry. No rash noted. No erythema. No pallor.  Psychiatric: She has a normal mood and affect.          Assessment & Plan:

## 2011-12-11 NOTE — Assessment & Plan Note (Signed)
With stable a1c under 6- will continue to work on healthy diet and exercise Stressed imp of exercise and wt loss to prev DM  Rev labs F.u 6 mo

## 2011-12-13 NOTE — Assessment & Plan Note (Signed)
Discussed how this problem influences overall health and the risks it imposes  Reviewed plan for weight loss with lower calorie diet (via better food choices and also portion control or program like weight watchers) and exercise building up to or more than 30 minutes 5 days per week including some aerobic activity   unsure how motivated she is for this

## 2012-02-02 ENCOUNTER — Ambulatory Visit: Payer: Medicare Other | Admitting: Cardiology

## 2012-02-02 ENCOUNTER — Encounter: Payer: Self-pay | Admitting: Cardiovascular Disease

## 2012-02-02 ENCOUNTER — Ambulatory Visit (INDEPENDENT_AMBULATORY_CARE_PROVIDER_SITE_OTHER): Payer: Medicare Other | Admitting: Cardiovascular Disease

## 2012-02-02 VITALS — BP 139/78 | HR 70 | Ht 65.0 in | Wt 241.0 lb

## 2012-02-02 DIAGNOSIS — I10 Essential (primary) hypertension: Secondary | ICD-10-CM

## 2012-02-02 DIAGNOSIS — I2510 Atherosclerotic heart disease of native coronary artery without angina pectoris: Secondary | ICD-10-CM

## 2012-02-02 NOTE — Assessment & Plan Note (Signed)
BP well controlled. No changes.  

## 2012-02-02 NOTE — Progress Notes (Signed)
History of Present Illness: 71 yo female with h/o CAD, HTN, OA and obesity here today for cardiac follow up. She was admitted to [facility] from 07/27/09 to 07/31/09 with a NSTEMI. She had a cardiac cath on 07/29/09 showing severe two vessel disease and we placed Promus drug eluting stents in the LAD and the RCA.   She is here today for planned annual cardiac follow up. She has had no chest pain or SOB. She has been doing well. She is exercising daily with water aerobics.   Primary Care Physician:  Dolores Lory Tower.   Last Lipid Profile:    Component Value Date/Time   CHOL 159 12/07/2011 0904   TRIG 154.0* 12/07/2011 0904   HDL 47.40 12/07/2011 0904   CHOLHDL 3 12/07/2011 0904   VLDL 30.8 12/07/2011 0904   LDLCALC 81 12/07/2011 0904     Past Medical History  Diagnosis Date  . Myocardial infarction     subendocardial, initial episode  . Hyperlipidemia   . Hypertension   . Obesity   . Neoplasm of skin     neoplasm of uncertain behavior of skin  . Vertigo   . Myopia   . Overactive bladder   . Urinary incontinence   . Osteoarthritis   . Asthma   . Allergy     allergic rhinitis  . Rash     and other non specific skin eruptions  . Gallstones     Past Surgical History  Procedure Date  . Cholecystectomy   . Joint replacement     right total knee replacement  . Coronary angioplasty with stent placement 07/2009  . Total knee arthroplasty     left , then vocal cord infection post op  . Vocal cord polypectomy     Current Outpatient Prescriptions  Medication Sig Dispense Refill  . acetaminophen (TYLENOL) 500 MG tablet Take 1,000 mg by mouth 2 (two) times daily.        Marland Kitchen albuterol (PROVENTIL HFA;VENTOLIN HFA) 108 (90 BASE) MCG/ACT inhaler Inhale 2 puffs into the lungs every 4 (four) hours as needed for wheezing or shortness of breath.  1 Inhaler  1  . amLODipine (NORVASC) 10 MG tablet TAKE 1 TABLET EVERY DAY  30 tablet  11  . Ascorbic Acid (VITAMIN C) 1000 MG tablet  Take 1,000 mg by mouth daily.        Marland Kitchen aspirin 81 MG tablet Take 81 mg by mouth daily.        . calcium-vitamin D (OSCAL WITH D 500-200) 500-200 MG-UNIT per tablet Take 1 tablet by mouth daily.        . chlorpheniramine (CHLOR-TRIMETON) 4 MG tablet Take 4 mg by mouth 2 (two) times daily as needed.        . clopidogrel (PLAVIX) 75 MG tablet TAKE 1 TABLET BY MOUTH EVERY DAY WITH FOOD  30 tablet  10  . cromolyn (NASALCROM) 5.2 MG/ACT nasal spray Place 2 sprays into the nose daily.        Marland Kitchen ezetimibe (ZETIA) 10 MG tablet Take 1 tablet (10 mg total) by mouth daily.  30 tablet  11  . Flaxseed, Linseed, (RA FLAX SEED OIL 1000) 1000 MG CAPS One capsule by mouth daily       . metoprolol tartrate (LOPRESSOR) 25 MG tablet TAKE 1 TABLET BY MOUTH TWICE A DAY  60 tablet  9  . Mirabegron ER (MYRBETRIQ) 50 MG TB24 Take 1 tablet by mouth daily.      Marland Kitchen  nitroGLYCERIN (NITROSTAT) 0.4 MG SL tablet Take sublingual as directed as needed.       Marland Kitchen omeprazole (PRILOSEC OTC) 20 MG tablet Take 20 mg by mouth daily.         Allergies  Allergen Reactions  . Shellfish Allergy Anaphylaxis  . Aspirin     REACTION: nausea and vomiting  . Atorvastatin     REACTION: leg pain  . Crestor (Rosuvastatin Calcium) Other (See Comments)    Muscle pain - allergy/intolerance  . Fish Allergy   . Fluticasone Propionate     REACTION: not effective  . Simvastatin     REACTION: muscle pain  . Trandolapril     REACTION: leg pain    History   Social History  . Marital Status: Married    Spouse Name: N/A    Number of Children: N/A  . Years of Education: N/A   Occupational History  . Not on file.   Social History Main Topics  . Smoking status: Former Smoker    Types: Cigarettes    Quit date: 07/27/1982  . Smokeless tobacco: Never Used  . Alcohol Use: No  . Drug Use: No  . Sexually Active: Not on file   Other Topics Concern  . Not on file   Social History Narrative  . No narrative on file    Family History    Problem Relation Age of Onset  . Stroke Mother   . Stroke Father   . Colon cancer Neg Hx   . Breast cancer Maternal Grandmother   . Diabetes Mother 50    Review of Systems:  As stated in the HPI and otherwise negative.   BP 139/78  Pulse 70  Ht 5\' 5"  (1.651 m)  Wt 241 lb (109.317 kg)  BMI 40.10 kg/m2  LMP 07/27/1980  Physical Examination: General: Well developed, well nourished, NAD HEENT: OP clear, mucus membranes moist SKIN: warm, dry. No rashes. Neuro: No focal deficits Musculoskeletal: Muscle strength 5/5 all ext Psychiatric: Mood and affect normal Neck: No JVD, no carotid bruits, no thyromegaly, no lymphadenopathy. Lungs:Clear bilaterally, no wheezes, rhonci, crackles Cardiovascular: Regular rate and rhythm. No murmurs, gallops or rubs. Abdomen:Soft. Bowel sounds present. Non-tender.  Extremities: No lower extremity edema. Pulses are 2 + in the bilateral DP/PT.  EKG: NSR, rate 70 bpm. Poor R wave progression.

## 2012-02-02 NOTE — Patient Instructions (Addendum)
Your physician wants you to follow-up in:  12 months.  You will receive a reminder letter in the mail two months in advance. If you don't receive a letter, please call our office to schedule the follow-up appointment.   

## 2012-02-02 NOTE — Assessment & Plan Note (Addendum)
Stable. Will continue ASA and Plavix, beta blocker and Zetia. She is intolerant to statins. No changes today.

## 2012-04-11 ENCOUNTER — Telehealth: Payer: Self-pay | Admitting: Family Medicine

## 2012-04-11 NOTE — Telephone Encounter (Signed)
Put her in for a follow up -any 30 min visit with labs prior is fine  Thanks !

## 2012-04-11 NOTE — Telephone Encounter (Signed)
Patient called to cancel her medicare wellness exam because she'll be out of town.  When patient was told the first available for a cpx would be in February.  She said she thought she was just coming in for a f/u appointment and didn't think you'd want her to wait until February.  She said she'd do a f/u appointment with labs before the appointment  In November.  Please advise.  Do you want her to come in for a f/u or a cpx?

## 2012-05-04 ENCOUNTER — Other Ambulatory Visit: Payer: Self-pay | Admitting: *Deleted

## 2012-05-04 ENCOUNTER — Other Ambulatory Visit: Payer: Self-pay | Admitting: Cardiovascular Disease

## 2012-05-04 MED ORDER — METOPROLOL TARTRATE 25 MG PO TABS: 25.0000 mg | ORAL_TABLET | Freq: Two times a day (BID) | ORAL | Status: DC

## 2012-05-04 NOTE — Telephone Encounter (Signed)
Refilled Metoprolol

## 2012-05-25 ENCOUNTER — Encounter: Payer: Self-pay | Admitting: Family Medicine

## 2012-05-25 ENCOUNTER — Ambulatory Visit (INDEPENDENT_AMBULATORY_CARE_PROVIDER_SITE_OTHER): Payer: Medicare Other | Admitting: Family Medicine

## 2012-05-25 VITALS — BP 150/80 | HR 80 | Temp 98.3°F | Ht 65.5 in | Wt 240.8 lb

## 2012-05-25 DIAGNOSIS — B9689 Other specified bacterial agents as the cause of diseases classified elsewhere: Secondary | ICD-10-CM

## 2012-05-25 DIAGNOSIS — A499 Bacterial infection, unspecified: Secondary | ICD-10-CM

## 2012-05-25 DIAGNOSIS — J208 Acute bronchitis due to other specified organisms: Secondary | ICD-10-CM | POA: Insufficient documentation

## 2012-05-25 DIAGNOSIS — J209 Acute bronchitis, unspecified: Secondary | ICD-10-CM

## 2012-05-25 MED ORDER — AZITHROMYCIN 250 MG PO TABS: ORAL_TABLET | ORAL | Status: DC

## 2012-05-25 NOTE — Progress Notes (Signed)
Subjective:    Patient ID: Kelli Velasquez, female    DOB: 01/21/1941, 71 y.o.   MRN: [id number]  HPI Here for uri symptoms  Prod cough - is white and thick -- going on for several weeks  Runny and stuffy nose and ST (the st is a bit better) No facial pain  Does not feel too bad, just tired Very hoarse  Delsym otc- helps a little   No fever  No chills or aches  Patient Active Problem List  Diagnosis  . HYPERLIPIDEMIA  . OBESITY  . MYOPIA  . HYPERTENSION  . MYOCARDIAL INFARCTION, SUBENDOCARDIAL, INITIAL EPISODE  . CAD, NATIVE VESSEL  . ALLERGIC RHINITIS  . ASTHMA  . OVERACTIVE BLADDER  . OSTEOARTHRITIS  . URINARY INCONTINENCE  . HYPERGLYCEMIA  . DYSPHONIA  . Preseptal cellulitis  . Special screening for malignant neoplasms, colon  . Other screening mammogram  . Bronchitis with airway obstruction  . Obesity  . Acute bacterial bronchitis   Past Medical History  Diagnosis Date  . Myocardial infarction     subendocardial, initial episode  . Hyperlipidemia   . Hypertension   . Obesity   . Neoplasm of skin     neoplasm of uncertain behavior of skin  . Vertigo   . Myopia   . Overactive bladder   . Urinary incontinence   . Osteoarthritis   . Asthma   . Allergy     allergic rhinitis  . Rash     and other non specific skin eruptions  . Gallstones   . Coronary artery disease     cath January 2011 with DEs LAD and RCA   Past Surgical History  Procedure Date  . Cholecystectomy   . Joint replacement     right total knee replacement  . Coronary angioplasty with stent placement 07/2009  . Total knee arthroplasty     left , then vocal cord infection post op  . Vocal cord polypectomy    History  Substance Use Topics  . Smoking status: Former Smoker    Types: Cigarettes    Quit date: 07/27/1982  . Smokeless tobacco: Never Used  . Alcohol Use: Yes     wine occasional   Family History  Problem Relation Age of Onset  . Stroke Mother   . Stroke Father   .  Colon cancer Neg Hx   . Breast cancer Maternal Grandmother   . Diabetes Mother 34   Allergies  Allergen Reactions  . Shellfish Allergy Anaphylaxis  . Aspirin     REACTION: nausea and vomiting  . Atorvastatin     REACTION: leg pain  . Crestor (Rosuvastatin Calcium) Other (See Comments)    Muscle pain - allergy/intolerance  . Fish Allergy   . Fluticasone Propionate     REACTION: not effective  . Simvastatin     REACTION: muscle pain  . Trandolapril     REACTION: leg pain   Current Outpatient Prescriptions on File Prior to Visit  Medication Sig Dispense Refill  . acetaminophen (TYLENOL) 500 MG tablet Take 1,000 mg by mouth 2 (two) times daily.        Marland Kitchen albuterol (PROVENTIL HFA;VENTOLIN HFA) 108 (90 BASE) MCG/ACT inhaler Inhale 2 puffs into the lungs every 4 (four) hours as needed for wheezing or shortness of breath.  1 Inhaler  1  . amLODipine (NORVASC) 10 MG tablet TAKE 1 TABLET EVERY DAY  30 tablet  11  . Ascorbic Acid (VITAMIN C) 1000 MG tablet Take 1,000  mg by mouth daily.        Marland Kitchen aspirin 81 MG tablet Take 81 mg by mouth daily.        . calcium-vitamin D (OSCAL WITH D 500-200) 500-200 MG-UNIT per tablet Take 1 tablet by mouth daily.        . chlorpheniramine (CHLOR-TRIMETON) 4 MG tablet Take 4 mg by mouth 2 (two) times daily as needed.        . clopidogrel (PLAVIX) 75 MG tablet TAKE 1 TABLET BY MOUTH EVERY DAY WITH FOOD  30 tablet  10  . cromolyn (NASALCROM) 5.2 MG/ACT nasal spray Place 2 sprays into the nose daily.        Marland Kitchen ezetimibe (ZETIA) 10 MG tablet Take 1 tablet (10 mg total) by mouth daily.  30 tablet  11  . Flaxseed, Linseed, (RA FLAX SEED OIL 1000) 1000 MG CAPS One capsule by mouth daily       . Flaxseed-Eve Prim-Bilberry (TEARS AGAIN HYDRATE) 1000-500-40 MG CAPS by Other route. PLACE 1 DROP BOTH EYES DAILY      . metoprolol tartrate (LOPRESSOR) 25 MG tablet Take 1 tablet (25 mg total) by mouth 2 (two) times daily.  60 tablet  6  . nitroGLYCERIN (NITROSTAT) 0.4 MG SL  tablet Take sublingual as directed as needed.       Marland Kitchen omeprazole (PRILOSEC OTC) 20 MG tablet Take 20 mg by mouth daily.             Review of Systems Review of Systems  Constitutional: Negative for fever, appetite change, fatigue and unexpected weight change.  Eyes: Negative for pain and visual disturbance.  ENT neg for sinus pain/ pos for cong and ST Respiratory: Negative for wheeze and shortness of breath.   Cardiovascular: Negative for cp or palpitations    Gastrointestinal: Negative for nausea, diarrhea and constipation.  Genitourinary: Negative for urgency and frequency.  Skin: Negative for pallor or rash   Neurological: Negative for weakness, light-headedness, numbness and headaches.  Hematological: Negative for adenopathy. Does not bruise/bleed easily.  Psychiatric/Behavioral: Negative for dysphoric mood. The patient is not nervous/anxious.         Objective:   Physical Exam  Constitutional: She appears well-developed and well-nourished. No distress.  HENT:  Head: Normocephalic and atraumatic.  Right Ear: External ear normal.  Left Ear: External ear normal.  Mouth/Throat: Oropharynx is clear and moist.       Nares are injected and congested  No sinus tenderness  Eyes: Conjunctivae normal and EOM are normal. Pupils are equal, round, and reactive to light. Right eye exhibits no discharge. Left eye exhibits no discharge.  Neck: Normal range of motion. Neck supple. No JVD present. Carotid bruit is not present. No thyromegaly present.  Cardiovascular: Normal rate, regular rhythm, normal heart sounds and intact distal pulses.   Pulmonary/Chest: Effort normal and breath sounds normal. No respiratory distress. She has no wheezes. She has no rales.       Harsh bs occ rhonchi No wheeze  Abdominal: She exhibits no abdominal bruit.  Musculoskeletal: She exhibits no edema and no tenderness.  Lymphadenopathy:    She has no cervical adenopathy.  Neurological: She is alert. She has  normal reflexes. No cranial nerve deficit.  Skin: Skin is warm and dry. No rash noted. No erythema. No pallor.  Psychiatric: She has a normal mood and affect.          Assessment & Plan:

## 2012-05-25 NOTE — Assessment & Plan Note (Signed)
Will cover with zpak Has robitussin ac for prn use with caution Disc symptomatic care - see instructions on AVS  Update if not starting to improve in a week or if worsening

## 2012-05-25 NOTE — Patient Instructions (Addendum)
Drink lots of fluids Take the zpak Rest  Use delsym or codeine cough syrup as directed as needed Update if not starting to improve in a week or if worsening   If wheezing worsens let me know

## 2012-06-06 ENCOUNTER — Telehealth: Payer: Self-pay | Admitting: Family Medicine

## 2012-06-06 DIAGNOSIS — R7309 Other abnormal glucose: Secondary | ICD-10-CM

## 2012-06-06 DIAGNOSIS — E785 Hyperlipidemia, unspecified: Secondary | ICD-10-CM

## 2012-06-06 DIAGNOSIS — I2510 Atherosclerotic heart disease of native coronary artery without angina pectoris: Secondary | ICD-10-CM

## 2012-06-06 DIAGNOSIS — I10 Essential (primary) hypertension: Secondary | ICD-10-CM

## 2012-06-06 NOTE — Telephone Encounter (Signed)
Message copied by Judy Pimple on Mon Jun 06, 2012  8:10 AM ------      Message from: Kelli Velasquez      Created: Fri May 27, 2012 10:53 AM      Regarding: Cpx labs Tues 11/12       Please order  future cpx labs for pt's upcomming lab appt.      Thanks      Rodney Booze

## 2012-06-07 ENCOUNTER — Other Ambulatory Visit (INDEPENDENT_AMBULATORY_CARE_PROVIDER_SITE_OTHER): Payer: Medicare Other

## 2012-06-07 ENCOUNTER — Other Ambulatory Visit: Payer: Medicare Other

## 2012-06-07 DIAGNOSIS — I2510 Atherosclerotic heart disease of native coronary artery without angina pectoris: Secondary | ICD-10-CM

## 2012-06-07 DIAGNOSIS — R7309 Other abnormal glucose: Secondary | ICD-10-CM

## 2012-06-07 DIAGNOSIS — I10 Essential (primary) hypertension: Secondary | ICD-10-CM

## 2012-06-07 DIAGNOSIS — E785 Hyperlipidemia, unspecified: Secondary | ICD-10-CM

## 2012-06-07 LAB — CBC WITH DIFFERENTIAL/PLATELET
Basophils Absolute: 0.1 10*3/uL (ref 0.0–0.1)
Eosinophils Absolute: 0.3 10*3/uL (ref 0.0–0.7)
HCT: 44.3 % (ref 36.0–46.0)
Lymphs Abs: 2.2 10*3/uL (ref 0.7–4.0)
MCHC: 32.6 g/dL (ref 30.0–36.0)
MCV: 94.3 fl (ref 78.0–100.0)
Monocytes Absolute: 0.7 10*3/uL (ref 0.1–1.0)
Platelets: 327 10*3/uL (ref 150.0–400.0)
RDW: 15.5 % — ABNORMAL HIGH (ref 11.5–14.6)

## 2012-06-07 LAB — LIPID PANEL
HDL: 46.6 mg/dL (ref >39.00)
LDL Cholesterol: 107 mg/dL — ABNORMAL HIGH (ref 0–99)
VLDL: 30.8 mg/dL (ref 0.0–40.0)

## 2012-06-07 LAB — COMPREHENSIVE METABOLIC PANEL
ALT: 18 U/L (ref 0–35)
Alkaline Phosphatase: 77 U/L (ref 39–117)
Creatinine, Ser: 0.9 mg/dL (ref 0.4–1.2)
Sodium: 141 mEq/L (ref 135–145)
Total Bilirubin: 0.6 mg/dL (ref 0.3–1.2)
Total Protein: 7.5 g/dL (ref 6.0–8.3)

## 2012-06-07 LAB — HEMOGLOBIN A1C: Hgb A1c MFr Bld: 5.4 % (ref 4.6–6.5)

## 2012-06-07 LAB — TSH: TSH: 1.16 u[IU]/mL (ref 0.35–5.50)

## 2012-06-13 ENCOUNTER — Encounter: Payer: Self-pay | Admitting: Family Medicine

## 2012-06-13 ENCOUNTER — Ambulatory Visit (INDEPENDENT_AMBULATORY_CARE_PROVIDER_SITE_OTHER): Payer: Medicare Other | Admitting: Family Medicine

## 2012-06-13 VITALS — BP 128/64 | HR 77 | Temp 97.9°F | Ht 65.25 in | Wt 242.2 lb

## 2012-06-13 DIAGNOSIS — I10 Essential (primary) hypertension: Secondary | ICD-10-CM

## 2012-06-13 DIAGNOSIS — E785 Hyperlipidemia, unspecified: Secondary | ICD-10-CM

## 2012-06-13 DIAGNOSIS — Z1231 Encounter for screening mammogram for malignant neoplasm of breast: Secondary | ICD-10-CM

## 2012-06-13 DIAGNOSIS — R7309 Other abnormal glucose: Secondary | ICD-10-CM

## 2012-06-13 DIAGNOSIS — E669 Obesity, unspecified: Secondary | ICD-10-CM

## 2012-06-13 MED ORDER — METOPROLOL TARTRATE 25 MG PO TABS: 25.0000 mg | ORAL_TABLET | Freq: Two times a day (BID) | ORAL | Status: DC

## 2012-06-13 MED ORDER — AMLODIPINE BESYLATE 10 MG PO TABS: 10.0000 mg | ORAL_TABLET | Freq: Every day | ORAL | Status: DC

## 2012-06-13 MED ORDER — EZETIMIBE 10 MG PO TABS: 10.0000 mg | ORAL_TABLET | Freq: Every day | ORAL | Status: DC

## 2012-06-13 MED ORDER — CLOPIDOGREL BISULFATE 75 MG PO TABS: 75.0000 mg | ORAL_TABLET | Freq: Every day | ORAL | Status: DC

## 2012-06-13 NOTE — Assessment & Plan Note (Signed)
Pt will make own mammo appt at [facility] Breast exam done  Enc monthly self exams

## 2012-06-13 NOTE — Progress Notes (Signed)
Subjective:    Patient ID: Kelli Velasquez, female    DOB: [birth year], 71 y.o.   MRN: [id number]  HPI Here for check up of chronic medical conditions and to review health mt list   Overall feeling better  Still coughing a bit   Is doing accupuncture for her bladder- and it is helping a lot -- gets up less at night  Better control    Wt is up 2 lb with bmi of 40 Diet and exercise Will get back on track - better before she got sick Sticking with vegetables , avoiding fatty foods , no junk food , big problem is portion control - loves to eat  Pool for exercise before she got sick  Will also be walking   Has CAD Hyperlipidemia  On zetia Lab Results  Component Value Date   CHOL 184 06/07/2012   CHOL 159 12/07/2011   CHOL 122 06/09/2011   Lab Results  Component Value Date   HDL 46.60 06/07/2012   HDL 47.40 12/07/2011   HDL 01.02 06/09/2011   Lab Results  Component Value Date   LDLCALC 107* 06/07/2012   LDLCALC 81 12/07/2011   LDLCALC 51 06/09/2011   Lab Results  Component Value Date   TRIG 154.0* 06/07/2012   TRIG 154.0* 12/07/2011   TRIG 130.0 06/09/2011   Lab Results  Component Value Date   CHOLHDL 4 06/07/2012   CHOLHDL 3 12/07/2011   CHOLHDL 3 06/09/2011   Lab Results  Component Value Date   LDLDIRECT 124.8 09/01/2007   LDLDIRECT 132.2 04/11/2007   LDLDIRECT 171.4 03/03/2007   LDL is up - with her CAD needs to be below 100 - did eat more fried foods and cheese   bp is stable today  No cp or palpitations or headaches or edema  No side effects to medicines  BP Readings from Last 3 Encounters:  06/13/12 128/64  05/25/12 150/80  02/02/12 139/78       Chemistry      Component Value Date/Time   NA 141 06/07/2012 0853   K 3.9 06/07/2012 0853   CL 101 06/07/2012 0853   CO2 31 06/07/2012 0853   BUN 19 06/07/2012 0853   CREATININE 0.9 06/07/2012 0853      Component Value Date/Time   CALCIUM 9.2 06/07/2012 0853   ALKPHOS 77 06/07/2012 0853   AST 19 06/07/2012  0853   ALT 18 06/07/2012 0853   BILITOT 0.6 06/07/2012 0853      Wbc was 12.4 Just getting over bronchitis - doing better   Glucose 107 a1c 5.4 quite good  Zoster status- has not had vaccine -needs to check about coverage    mammo 11/12- is due for a mammogram , she goes to [facility] make own appt  Self exam - no lumps or changes    colonosc 1/13 Up to date on that   Patient Active Problem List  Diagnosis  . HYPERLIPIDEMIA  . OBESITY  . MYOPIA  . HYPERTENSION  . MYOCARDIAL INFARCTION, SUBENDOCARDIAL, INITIAL EPISODE  . CAD, NATIVE VESSEL  . ALLERGIC RHINITIS  . ASTHMA  . OVERACTIVE BLADDER  . OSTEOARTHRITIS  . URINARY INCONTINENCE  . HYPERGLYCEMIA  . DYSPHONIA  . Preseptal cellulitis  . Special screening for malignant neoplasms, colon  . Other screening mammogram  . Bronchitis with airway obstruction  . Acute bacterial bronchitis   Past Medical History  Diagnosis Date  . Myocardial infarction     subendocardial, initial episode  .  Hyperlipidemia   . Hypertension   . Obesity   . Neoplasm of skin     neoplasm of uncertain behavior of skin  . Vertigo   . Myopia   . Overactive bladder   . Urinary incontinence   . Osteoarthritis   . Asthma   . Allergy     allergic rhinitis  . Rash     and other non specific skin eruptions  . Gallstones   . Coronary artery disease     cath January 2011 with DEs LAD and RCA   Past Surgical History  Procedure Date  . Cholecystectomy   . Joint replacement     right total knee replacement  . Coronary angioplasty with stent placement 07/2009  . Total knee arthroplasty     left , then vocal cord infection post op  . Vocal cord polypectomy    History  Substance Use Topics  . Smoking status: Former Smoker    Types: Cigarettes    Quit date: 07/27/1982  . Smokeless tobacco: Never Used  . Alcohol Use: Yes     Comment: wine occasional   Family History  Problem Relation Age of Onset  . Stroke Mother   . Stroke  Father   . Colon cancer Neg Hx   . Breast cancer Maternal Grandmother   . Diabetes Mother 10   Allergies  Allergen Reactions  . Shellfish Allergy Anaphylaxis  . Aspirin     REACTION: nausea and vomiting  . Atorvastatin     REACTION: leg pain  . Crestor (Rosuvastatin Calcium) Other (See Comments)    Muscle pain - allergy/intolerance  . Fish Allergy   . Fluticasone Propionate     REACTION: not effective  . Simvastatin     REACTION: muscle pain  . Trandolapril     REACTION: leg pain   Current Outpatient Prescriptions on File Prior to Visit  Medication Sig Dispense Refill  . acetaminophen (TYLENOL) 500 MG tablet Take 1,000 mg by mouth as needed.       Marland Kitchen albuterol (PROVENTIL HFA;VENTOLIN HFA) 108 (90 BASE) MCG/ACT inhaler Inhale 2 puffs into the lungs every 4 (four) hours as needed for wheezing or shortness of breath.  1 Inhaler  1  . amLODipine (NORVASC) 10 MG tablet TAKE 1 TABLET EVERY DAY  30 tablet  11  . Ascorbic Acid (VITAMIN C) 1000 MG tablet Take 1,000 mg by mouth daily.        Marland Kitchen aspirin 81 MG tablet Take 81 mg by mouth daily.        . calcium-vitamin D (OSCAL WITH D 500-200) 500-200 MG-UNIT per tablet Take 1 tablet by mouth daily.        . clopidogrel (PLAVIX) 75 MG tablet TAKE 1 TABLET BY MOUTH EVERY DAY WITH FOOD  30 tablet  10  . cromolyn (NASALCROM) 5.2 MG/ACT nasal spray Place 2 sprays into the nose daily.       Marland Kitchen ezetimibe (ZETIA) 10 MG tablet Take 1 tablet (10 mg total) by mouth daily.  30 tablet  11  . Flaxseed, Linseed, (RA FLAX SEED OIL 1000) 1000 MG CAPS One capsule by mouth daily       . Flaxseed-Eve Prim-Bilberry (TEARS AGAIN HYDRATE) 1000-500-40 MG CAPS by Other route. PLACE 1 DROP BOTH EYES DAILY      . metoprolol tartrate (LOPRESSOR) 25 MG tablet Take 1 tablet (25 mg total) by mouth 2 (two) times daily.  60 tablet  6  . nitroGLYCERIN (NITROSTAT) 0.4 MG  SL tablet Take sublingual as directed as needed.       Marland Kitchen omeprazole (PRILOSEC OTC) 20 MG tablet Take 20 mg by  mouth daily.           Review of Systems Review of Systems  Constitutional: Negative for fever, appetite change, fatigue and unexpected weight change.  Eyes: Negative for pain and visual disturbance.  Respiratory: Negative for wheeze  and shortness of breath.   Cardiovascular: Negative for cp or palpitations    Gastrointestinal: Negative for nausea, diarrhea and constipation.  Genitourinary: Negative for urgency and frequency.  Skin: Negative for pallor or rash   Neurological: Negative for weakness, light-headedness, numbness and headaches.  Hematological: Negative for adenopathy. Does not bruise/bleed easily.  Psychiatric/Behavioral: Negative for dysphoric mood. The patient is not nervous/anxious.         Objective:   Physical Exam  Constitutional: She appears well-developed and well-nourished. No distress.       obese and well appearing   HENT:  Head: Normocephalic and atraumatic.  Right Ear: External ear normal.  Left Ear: External ear normal.  Nose: Nose normal.  Mouth/Throat: No oropharyngeal exudate.  Eyes: Conjunctivae normal and EOM are normal. Pupils are equal, round, and reactive to light. Right eye exhibits no discharge. Left eye exhibits no discharge. No scleral icterus.  Neck: Normal range of motion. Neck supple. No JVD present. Carotid bruit is not present. No thyromegaly present.  Cardiovascular: Normal rate, regular rhythm, normal heart sounds and intact distal pulses.  Exam reveals no gallop.   Pulmonary/Chest: Effort normal and breath sounds normal. No respiratory distress. She has no wheezes.  Abdominal: Soft. Bowel sounds are normal. She exhibits no distension, no abdominal bruit and no mass. There is no tenderness.  Genitourinary: No breast swelling, tenderness, discharge or bleeding.       Breast exam: No mass, nodules, thickening, tenderness, bulging, retraction, inflamation, nipple discharge or skin changes noted.  No axillary or clavicular LA.  Chaperoned  exam.    Musculoskeletal: She exhibits no edema and no tenderness.  Lymphadenopathy:    She has no cervical adenopathy.  Neurological: She is alert. She has normal reflexes. No cranial nerve deficit. She exhibits normal muscle tone. Coordination normal.  Skin: Skin is warm and dry. No rash noted. No erythema. No pallor.  Psychiatric: She has a normal mood and affect.          Assessment & Plan:

## 2012-06-13 NOTE — Assessment & Plan Note (Signed)
bp in fair control at this time  No changes needed  Disc lifstyle change with low sodium diet and exercise  Labs reviewed today 

## 2012-06-13 NOTE — Patient Instructions (Addendum)
Your cholesterol is up- so watch diet closely  Avoid red meat/ fried foods/ egg yolks/ fatty breakfast meats/ butter, cheese and high fat dairy/ and shellfish   Schedule fasting labs in 3 months for cholesterol  Don't forget to schedule your own mammogram appt at [facility] Keep working on healthy diet and exercise for weight loss  If you are interested in a shingles/zoster vaccine - call your insurance to check on coverage,( you should not get it within 1 month of other vaccines) , then call us for a prescription  for it to take to a pharmacy that gives the shot , or make a nurse visit to get it here depending on your coverage

## 2012-06-13 NOTE — Assessment & Plan Note (Signed)
Disc goals for lipids and reasons to control them Rev labs with pt Rev low sat fat diet in detail  This is up - will watch diet more carefully and re check 3 mo  Is on zetia-non tol of statins  Has CAD

## 2012-06-13 NOTE — Assessment & Plan Note (Signed)
Discussed how this problem influences overall health and the risks it imposes  Reviewed plan for weight loss with lower calorie diet (via better food choices and also portion control or program like weight watchers) and exercise building up to or more than 30 minutes 5 days per week including some aerobic activity    

## 2012-06-13 NOTE — Assessment & Plan Note (Signed)
Lab Results  Component Value Date   HGBA1C 5.4 06/07/2012    Not a problem now Disc low glycemic diet  Wt loss is most important

## 2012-06-14 ENCOUNTER — Encounter: Payer: Medicare Other | Admitting: Family Medicine

## 2012-06-25 ENCOUNTER — Other Ambulatory Visit: Payer: Self-pay | Admitting: Cardiovascular Disease

## 2012-07-11 ENCOUNTER — Ambulatory Visit (INDEPENDENT_AMBULATORY_CARE_PROVIDER_SITE_OTHER): Payer: Medicare Other | Admitting: Family Medicine

## 2012-07-11 ENCOUNTER — Encounter: Payer: Self-pay | Admitting: Family Medicine

## 2012-07-11 VITALS — BP 140/70 | HR 93 | Temp 97.8°F | Ht 65.25 in | Wt 244.0 lb

## 2012-07-11 DIAGNOSIS — J069 Acute upper respiratory infection, unspecified: Secondary | ICD-10-CM

## 2012-07-11 MED ORDER — ALBUTEROL SULFATE HFA 108 (90 BASE) MCG/ACT IN AERS: 2.0000 | INHALATION_SPRAY | RESPIRATORY_TRACT | Status: DC | PRN

## 2012-07-11 NOTE — Patient Instructions (Addendum)
I think you have a cold (chest cold) Drink lots of fluids Use inhaler for wheezing if needed  Also delsym or mucinex for cough  Get some extra rest  Please update me if worse - cough or wheezing , or if you develop other symptoms - will watch this closely

## 2012-07-11 NOTE — Assessment & Plan Note (Signed)
Reassuring exam - some cough and wheeze on forced exp only  Disc symptomatic care - see instructions on AVS;t  refil albuterol hfa  Update if not starting to improve in a week or if worsening

## 2012-07-11 NOTE — Progress Notes (Signed)
Subjective:    Patient ID: Kelli Velasquez, female    DOB: 1941/02/03, 71 y.o.   MRN: [id number]  HPI Started getting sick last night  Thinks she has bronchitis again Cough - is productive - mucous -is clear so far  Some wheeze  At times sob after coughing  Decorating a lot for xmas - and exp to lots of dust   Nose and ears and throat are ok  Had a really runny nose for the past week  No headache  No fever   Is a heart patient   Had bronchitis about 3-4 weeks ago  Thinks she got better for 1-2 weeks   otc - delsym Inhaler also   Patient Active Problem List  Diagnosis  . HYPERLIPIDEMIA  . OBESITY  . MYOPIA  . HYPERTENSION  . MYOCARDIAL INFARCTION, SUBENDOCARDIAL, INITIAL EPISODE  . CAD, NATIVE VESSEL  . ALLERGIC RHINITIS  . ASTHMA  . OVERACTIVE BLADDER  . OSTEOARTHRITIS  . URINARY INCONTINENCE  . HYPERGLYCEMIA  . DYSPHONIA  . Preseptal cellulitis  . Special screening for malignant neoplasms, colon  . Other screening mammogram  . Bronchitis with airway obstruction  . Acute bacterial bronchitis   Past Medical History  Diagnosis Date  . Myocardial infarction     subendocardial, initial episode  . Hyperlipidemia   . Hypertension   . Obesity   . Neoplasm of skin     neoplasm of uncertain behavior of skin  . Vertigo   . Myopia   . Overactive bladder   . Urinary incontinence   . Osteoarthritis   . Asthma   . Allergy     allergic rhinitis  . Rash     and other non specific skin eruptions  . Gallstones   . Coronary artery disease     cath January 2011 with DEs LAD and RCA   Past Surgical History  Procedure Date  . Cholecystectomy   . Joint replacement     right total knee replacement  . Coronary angioplasty with stent placement 07/2009  . Total knee arthroplasty     left , then vocal cord infection post op  . Vocal cord polypectomy    History  Substance Use Topics  . Smoking status: Former Smoker    Types: Cigarettes    Quit date: 07/27/1982  .  Smokeless tobacco: Never Used  . Alcohol Use: Yes     Comment: wine occasional   Family History  Problem Relation Age of Onset  . Stroke Mother   . Stroke Father   . Colon cancer Neg Hx   . Breast cancer Maternal Grandmother   . Diabetes Mother 56   Allergies  Allergen Reactions  . Shellfish Allergy Anaphylaxis  . Aspirin     REACTION: nausea and vomiting  . Atorvastatin     REACTION: leg pain  . Crestor (Rosuvastatin Calcium) Other (See Comments)    Muscle pain - allergy/intolerance  . Fish Allergy   . Fluticasone Propionate     REACTION: not effective  . Simvastatin     REACTION: muscle pain  . Trandolapril     REACTION: leg pain   Current Outpatient Prescriptions on File Prior to Visit  Medication Sig Dispense Refill  . acetaminophen (TYLENOL) 500 MG tablet Take 1,000 mg by mouth as needed.       Marland Kitchen albuterol (PROVENTIL HFA;VENTOLIN HFA) 108 (90 BASE) MCG/ACT inhaler Inhale 2 puffs into the lungs every 4 (four) hours as needed for wheezing or shortness of  breath.  1 Inhaler  1  . amLODipine (NORVASC) 10 MG tablet Take 1 tablet (10 mg total) by mouth daily.  30 tablet  11  . Ascorbic Acid (VITAMIN C) 1000 MG tablet Take 1,000 mg by mouth daily.        Marland Kitchen aspirin 81 MG tablet Take 81 mg by mouth daily.        . calcium-vitamin D (OSCAL WITH D 500-200) 500-200 MG-UNIT per tablet Take 1 tablet by mouth daily.        . clopidogrel (PLAVIX) 75 MG tablet Take 1 tablet (75 mg total) by mouth daily.  30 tablet  11  . clopidogrel (PLAVIX) 75 MG tablet TAKE 1 TABLET BY MOUTH EVERY DAY WITH FOOD  30 tablet  10  . cromolyn (NASALCROM) 5.2 MG/ACT nasal spray Place 2 sprays into the nose daily.       Marland Kitchen ezetimibe (ZETIA) 10 MG tablet Take 1 tablet (10 mg total) by mouth daily.  30 tablet  11  . Flaxseed, Linseed, (RA FLAX SEED OIL 1000) 1000 MG CAPS One capsule by mouth daily       . Flaxseed-Eve Prim-Bilberry (TEARS AGAIN HYDRATE) 1000-500-40 MG CAPS by Other route. PLACE 1 DROP BOTH EYES  DAILY      . metoprolol tartrate (LOPRESSOR) 25 MG tablet Take 1 tablet (25 mg total) by mouth 2 (two) times daily.  60 tablet  11  . nitroGLYCERIN (NITROSTAT) 0.4 MG SL tablet Take sublingual as directed as needed.       Marland Kitchen omeprazole (PRILOSEC OTC) 20 MG tablet Take 20 mg by mouth daily.          Review of Systems Review of Systems  Constitutional: Negative for fever, appetite change, fatigue and unexpected weight change.  Eyes: Negative for pain and visual disturbance.  ENT pos for rhinorrhea and neg for facial pain  Respiratory: Negative for shortness of breath.   Cardiovascular: Negative for cp or palpitations    Gastrointestinal: Negative for nausea, diarrhea and constipation.  Genitourinary: Negative for urgency and frequency.  Skin: Negative for pallor or rash   Neurological: Negative for weakness, light-headedness, numbness and headaches.  Hematological: Negative for adenopathy. Does not bruise/bleed easily.  Psychiatric/Behavioral: Negative for dysphoric mood. The patient is not nervous/anxious.         Objective:   Physical Exam  Constitutional: She appears well-developed and well-nourished. No distress.       obese and well appearing   HENT:  Head: Normocephalic and atraumatic.  Mouth/Throat: Oropharynx is clear and moist.  Eyes: Conjunctivae normal and EOM are normal. Pupils are equal, round, and reactive to light. Right eye exhibits no discharge. No scleral icterus.  Neck: Normal range of motion. Neck supple. No JVD present. No thyromegaly present.  Cardiovascular: Normal rate, regular rhythm, normal heart sounds and intact distal pulses.  Exam reveals no gallop.   Pulmonary/Chest: Effort normal and breath sounds normal. No respiratory distress. She has no wheezes. She has no rales.       Harsh bs at bases  No rales or rhonchi or wheeze  Lymphadenopathy:    She has no cervical adenopathy.  Neurological: She is alert. She has normal reflexes. No cranial nerve deficit.   Skin: Skin is warm and dry. No rash noted.  Psychiatric: She has a normal mood and affect.          Assessment & Plan:

## 2012-07-18 ENCOUNTER — Telehealth: Payer: Self-pay | Admitting: Cardiovascular Disease

## 2012-07-18 NOTE — Telephone Encounter (Signed)
Patient read over information given to her about taking Plavix and Omeprazole together and is now questioning if she should be on both. Patient has been taking both for about 3 years. Will forward to Dr Clifton James for review, patient aware it will be next week before call back. Patient ok with that

## 2012-07-18 NOTE — Telephone Encounter (Signed)
New problem:    Need to discuss medication. Patient read the side effect advise that if you are taken prilosec otc 20 mg . Advise that both medication should not be taken together.

## 2012-07-24 NOTE — Telephone Encounter (Signed)
In a small part of the population, drugs like Prilosec can lessen the effectiveness of Plavix however she has done well on both. Her stents were placed 3 years ago and should be healed. She can stop prilosec if she would like and try pepcid or zantac but it is probably ok to continue both prilosec and plavix. Thanks, chris

## 2012-07-26 NOTE — Telephone Encounter (Signed)
Left message to call back  

## 2012-07-26 NOTE — Telephone Encounter (Signed)
Spoke with pt and gave her information from Dr. McAlhany 

## 2012-07-26 NOTE — Telephone Encounter (Signed)
F/U   Returning call back to nurse.   

## 2012-08-11 ENCOUNTER — Ambulatory Visit: Payer: Self-pay | Admitting: Family Medicine

## 2012-08-12 ENCOUNTER — Encounter: Payer: Self-pay | Admitting: Family Medicine

## 2012-08-15 ENCOUNTER — Encounter: Payer: Self-pay | Admitting: *Deleted

## 2012-09-13 ENCOUNTER — Other Ambulatory Visit: Payer: Medicare Other

## 2012-09-15 ENCOUNTER — Other Ambulatory Visit (INDEPENDENT_AMBULATORY_CARE_PROVIDER_SITE_OTHER): Payer: Medicare Other

## 2012-09-15 LAB — LIPID PANEL
Cholesterol: 173 mg/dL (ref 0–200)
HDL: 41.7 mg/dL (ref >39.00)
VLDL: 37 mg/dL (ref 0.0–40.0)

## 2012-09-16 ENCOUNTER — Encounter: Payer: Self-pay | Admitting: *Deleted

## 2012-10-12 ENCOUNTER — Other Ambulatory Visit: Payer: Self-pay | Admitting: Family Medicine

## 2012-10-13 ENCOUNTER — Telehealth: Payer: Self-pay

## 2012-10-13 MED ORDER — EPINEPHRINE 0.3 MG/0.3ML IJ DEVI: 0.3000 mg | Freq: Once | INTRAMUSCULAR | Status: DC

## 2012-10-13 NOTE — Telephone Encounter (Signed)
I sent it to her pharmacy

## 2012-10-13 NOTE — Telephone Encounter (Signed)
Pt going on vacation in April and pt is allergic to fish and request Epi pen sent to [facility]. Pt has an epi pen but expired few years ago.Please advise. Pt can wait for Dr Royden Purl return to get rx sent.

## 2012-10-14 NOTE — Telephone Encounter (Signed)
Pt notified Rx set to pharmacy

## 2012-10-26 ENCOUNTER — Encounter: Payer: Self-pay | Admitting: Family Medicine

## 2012-10-26 ENCOUNTER — Ambulatory Visit (INDEPENDENT_AMBULATORY_CARE_PROVIDER_SITE_OTHER)
Admission: RE | Admit: 2012-10-26 | Discharge: 2012-10-26 | Disposition: A | Discharge status: Home / Self Care | Payer: Medicare Other | Source: Ambulatory Visit | Attending: Family Medicine | Admitting: Family Medicine

## 2012-10-26 ENCOUNTER — Ambulatory Visit (INDEPENDENT_AMBULATORY_CARE_PROVIDER_SITE_OTHER): Payer: Medicare Other | Admitting: Family Medicine

## 2012-10-26 ENCOUNTER — Telehealth: Payer: Self-pay | Admitting: Family Medicine

## 2012-10-26 ENCOUNTER — Telehealth: Payer: Self-pay | Admitting: Cardiovascular Disease

## 2012-10-26 VITALS — BP 120/70 | HR 65 | Temp 98.0°F | Ht 65.25 in | Wt 242.2 lb

## 2012-10-26 DIAGNOSIS — R252 Cramp and spasm: Secondary | ICD-10-CM

## 2012-10-26 DIAGNOSIS — J9611 Chronic respiratory failure with hypoxia: Secondary | ICD-10-CM | POA: Insufficient documentation

## 2012-10-26 DIAGNOSIS — J45909 Unspecified asthma, uncomplicated: Secondary | ICD-10-CM

## 2012-10-26 DIAGNOSIS — R05 Cough: Secondary | ICD-10-CM

## 2012-10-26 LAB — COMPREHENSIVE METABOLIC PANEL
ALT: 39 U/L — ABNORMAL HIGH (ref 0–35)
AST: 31 U/L (ref 0–37)
Albumin: 4 g/dL (ref 3.5–5.2)
BUN: 17 mg/dL (ref 6–23)
CO2: 31 mEq/L (ref 19–32)
Calcium: 9.6 mg/dL (ref 8.4–10.5)
Chloride: 96 mEq/L (ref 96–112)
Creatinine, Ser: 1 mg/dL (ref 0.4–1.2)
GFR: 60.6 mL/min (ref >60.00)
Potassium: 4.1 mEq/L (ref 3.5–5.1)

## 2012-10-26 LAB — CK: Total CK: 35 U/L (ref 7–177)

## 2012-10-26 NOTE — Telephone Encounter (Signed)
Lopressor is cardio-selective and in theory should not affect her asthma but there is always the possibility that it could be involved in her airway disease. She can try holding for a month to see if her breathing improves. cdm

## 2012-10-26 NOTE — Assessment & Plan Note (Signed)
Intermittent- worse when on a plane Lab today incl cmet and tsh and cpk  Disc stretching / fitness/ use of small servings of tonic water Nl exam today

## 2012-10-26 NOTE — Telephone Encounter (Signed)
Patient Information:  Caller Name: Ondine  Phone: [phone]  Patient: Niylah, Hassan  Gender: Female  DOB: 1941/03/14  Age: 72 Years  PCP: Roxy Manns [facility])  Office Follow Up:  Does the office need to follow up with this patient?: No  Instructions For The Office: N/A  RN Note:  Fight with muscle spasms was 10/21/12 was when she experienced the muscle spasms.  The other concern is a cough. The cough has been present on and off for past year with various Bronchitis episodes.  Has a history of asthma.  Most recent episode started about a week ago.  Is productive - white, but gooey per patient.  Cough is hacking.  current is not running a fever.  Cough work but she is planning more trips and wants to rule out nothing infective is going on. Uses albuterol inhaler as need--last used on 10/25/12 and once today 10/26/12 when chest tightens.  Has periodic wheezing and chest congestion noted last night but not this morning.  Triage with care advice given.  Appointment made today with Dr. Milinda Antis at 12:15  Symptoms  Reason For Call & Symptoms: Is calling about muscle spasms she had during flight;  Also has a congested cough off and on for past year.  Cough-is productive but clear.  Reviewed Health History In EMR: Yes  Reviewed Medications In EMR: Yes  Reviewed Allergies In EMR: Yes  Reviewed Surgeries / Procedures: Yes  Date of Onset of Symptoms: 10/19/2012  Treatments Tried: Delsym-other cough medication  Treatments Tried Worked: Yes  Guideline(s) Used:  Cough  Disposition Per Guideline:   See Today in Office  Reason For Disposition Reached:   Known COPD or other severe lung disease (i.e., bronchiectasis, cystic fibrosis, lung surgery) and worsening symptoms (i.e., increased sputum purulence or amount, increased breathing difficulty)  Advice Given:  Call Back If:  You become worse.  Patient Will Follow Care Advice:  YES  Appointment Scheduled:  10/26/2012  12:15:00 Appointment Scheduled Provider:  Roxy Manns [facility])

## 2012-10-26 NOTE — Telephone Encounter (Signed)
Spoke with pt. She reports cough since last April. Was treated for Bronchitis and Asthma at that time. Some improvement but has still had dry cough off and on since then. Wheezes at times and uses albuterol which helps a little. She has been on metoprolol since 2011. She is wondering if metoprolol is contributing to asthma. (see note from primary MD visit today). Blood pressure today at Dr. Royden Purl office was 120/70 and heart rate 65.  Pt does not check on a regular basis but states these readings are normal for her.

## 2012-10-26 NOTE — Patient Instructions (Addendum)
Chest xray today Talk to your cardiologist about the beta blocker and let me know if no improvement  Labs for muscle cramps today  Keep stretched and active as much as you can  Try 4 oz of tonic water as needed

## 2012-10-26 NOTE — Progress Notes (Signed)
Subjective:    Patient ID: Kelli Velasquez, female    DOB: Sep 25, 1940, 72 y.o.   MRN: [id number]  HPI Here for cough and also and episode of muscle spasms   Is coughing - chronically on and off - it comes and goes but never really goes away - has been around a year (last April) Cough starts first and then she will begin wheezing  She takes prilosec -no heartburn at all  Spring is not a worse time than others for the most part-though she does have some pollen allergies   She has noticed asthma worsen since being on a beta blocker  She has not talked to her cardiologist about this - her next follow up is July   Had a little fever 2 nights ago - none since  No cold symptoms  Her cough is mildly productive - white (this comes and goes)  Really does not think she has caught something   Also had an episode of muscle spasms Flight from [place] to [place]  She has had spasms and cramps in the past but never this bad  This was the worst she ever had -- cramping all over her body , she tried to hold ice on different parts of her body  The worst area was sides and arms and wrists Bottom of legs and feet swollen a bit   (forgot her pressure socks) She tries to keep moving throughout the flight     Chemistry      Component Value Date/Time   NA 141 06/07/2012 0853   K 3.9 06/07/2012 0853   CL 101 06/07/2012 0853   CO2 31 06/07/2012 0853   BUN 19 06/07/2012 0853   CREATININE 0.9 06/07/2012 0853      Component Value Date/Time   CALCIUM 9.2 06/07/2012 0853   ALKPHOS 77 06/07/2012 0853   AST 19 06/07/2012 0853   ALT 18 06/07/2012 0853   BILITOT 0.6 06/07/2012 0853       Has 2 more trips coming up - worried the muscle cramping will happen again   Patient Active Problem List  Diagnosis  . HYPERLIPIDEMIA  . OBESITY  . MYOPIA  . HYPERTENSION  . MYOCARDIAL INFARCTION, SUBENDOCARDIAL, INITIAL EPISODE  . CAD, NATIVE VESSEL  . ALLERGIC RHINITIS  . ASTHMA  . OVERACTIVE BLADDER  .  OSTEOARTHRITIS  . URINARY INCONTINENCE  . HYPERGLYCEMIA  . DYSPHONIA  . Preseptal cellulitis  . Special screening for malignant neoplasms, colon  . Other screening mammogram  . Cough  . Muscle cramps   Past Medical History  Diagnosis Date  . Myocardial infarction     subendocardial, initial episode  . Hyperlipidemia   . Hypertension   . Obesity   . Neoplasm of skin     neoplasm of uncertain behavior of skin  . Vertigo   . Myopia   . Overactive bladder   . Urinary incontinence   . Osteoarthritis   . Asthma   . Allergy     allergic rhinitis  . Rash     and other non specific skin eruptions  . Gallstones   . Coronary artery disease     cath January 2011 with DEs LAD and RCA   Past Surgical History  Procedure Laterality Date  . Cholecystectomy    . Joint replacement      right total knee replacement  . Coronary angioplasty with stent placement  07/2009  . Total knee arthroplasty      left , then  vocal cord infection post op  . Vocal cord polypectomy     History  Substance Use Topics  . Smoking status: Former Smoker    Types: Cigarettes    Quit date: 07/27/1982  . Smokeless tobacco: Never Used  . Alcohol Use: Yes     Comment: wine occasional   Family History  Problem Relation Age of Onset  . Stroke Mother   . Stroke Father   . Colon cancer Neg Hx   . Breast cancer Maternal Grandmother   . Diabetes Mother 31   Allergies  Allergen Reactions  . Shellfish Allergy Anaphylaxis  . Aspirin     REACTION: nausea and vomiting  . Atorvastatin     REACTION: leg pain  . Crestor (Rosuvastatin Calcium) Other (See Comments)    Muscle pain - allergy/intolerance  . Fish Allergy   . Fluticasone Propionate     REACTION: not effective  . Simvastatin     REACTION: muscle pain  . Trandolapril     REACTION: leg pain   Current Outpatient Prescriptions on File Prior to Visit  Medication Sig Dispense Refill  . acetaminophen (TYLENOL) 500 MG tablet Take 1,000 mg by mouth  as needed.       Marland Kitchen albuterol (PROVENTIL HFA;VENTOLIN HFA) 108 (90 BASE) MCG/ACT inhaler Inhale 2 puffs into the lungs every 4 (four) hours as needed for wheezing or shortness of breath.  1 Inhaler  5  . amLODipine (NORVASC) 10 MG tablet TAKE 1 TABLET EVERY DAY  30 tablet  5  . Ascorbic Acid (VITAMIN C) 1000 MG tablet Take 1,000 mg by mouth daily.        Marland Kitchen aspirin 81 MG tablet Take 81 mg by mouth daily.        . calcium-vitamin D (OSCAL WITH D 500-200) 500-200 MG-UNIT per tablet Take 1 tablet by mouth daily.        . clopidogrel (PLAVIX) 75 MG tablet Take 1 tablet (75 mg total) by mouth daily.  30 tablet  11  . cromolyn (NASALCROM) 5.2 MG/ACT nasal spray Place 2 sprays into the nose daily.       Marland Kitchen EPINEPHrine (EPI-PEN) 0.3 mg/0.3 mL DEVI Inject 0.3 mLs (0.3 mg total) into the muscle once. As needed for allergic reaction  1 Device  3  . ezetimibe (ZETIA) 10 MG tablet Take 1 tablet (10 mg total) by mouth daily.  30 tablet  11  . Flaxseed, Linseed, (RA FLAX SEED OIL 1000) 1000 MG CAPS One capsule by mouth daily       . Flaxseed-Eve Prim-Bilberry (TEARS AGAIN HYDRATE) 1000-500-40 MG CAPS by Other route. PLACE 1 DROP BOTH EYES DAILY      . nitroGLYCERIN (NITROSTAT) 0.4 MG SL tablet Take sublingual as directed as needed.       Marland Kitchen omeprazole (PRILOSEC OTC) 20 MG tablet Take 20 mg by mouth daily.        No current facility-administered medications on file prior to visit.    Review of Systems Review of Systems  Constitutional: Negative for fever, appetite change,  and unexpected weight change.  Eyes: Negative for pain and visual disturbance.  ENT pos for post nasal drip/ neg for ST or hoarseness or throat clearing Respiratory: Negative for  shortness of breath.   Cardiovascular: Negative for cp or palpitations    Gastrointestinal: Negative for nausea, diarrhea and constipation.  Genitourinary: Negative for urgency and frequency.  Skin: Negative for pallor or rash   MSK neg for joint pain or  swelling Neurological: Negative for weakness, light-headedness, numbness and headaches.  Hematological: Negative for adenopathy. Does not bruise/bleed easily.  Psychiatric/Behavioral: Negative for dysphoric mood. The patient is not nervous/anxious.         Objective:   Physical Exam  Constitutional: She appears well-developed and well-nourished. No distress.  obese and well appearing   HENT:  Head: Normocephalic and atraumatic.  Mouth/Throat: Oropharynx is clear and moist.  Eyes: Conjunctivae and EOM are normal. Pupils are equal, round, and reactive to light. Right eye exhibits no discharge. Left eye exhibits no discharge. No scleral icterus.  Neck: Normal range of motion. Neck supple. No JVD present. Carotid bruit is not present. No thyromegaly present.  Cardiovascular: Normal rate, regular rhythm, normal heart sounds and intact distal pulses.  Exam reveals no gallop.   Pulmonary/Chest: Effort normal and breath sounds normal. No respiratory distress. She has no wheezes. She has no rales.  Abdominal: Soft. Bowel sounds are normal. She exhibits no distension and no mass. There is no tenderness.  Musculoskeletal: Normal range of motion. She exhibits no edema and no tenderness.  No acute joint changes No palp cords in legs Neg homans sign No muscle spasm appreciated today  Lymphadenopathy:    She has no cervical adenopathy.  Neurological: She is alert. She has normal reflexes. She displays no tremor. No cranial nerve deficit. She exhibits normal muscle tone. Coordination and gait normal.          Assessment & Plan:

## 2012-10-26 NOTE — Assessment & Plan Note (Signed)
This seems to be worse lately - ? If triggering cough/ pt denies gerd symptoms She notices worsening since starting beta blocker and ? If significant -will disc this with her cardiologist  Has albuterol cxr today  On prilosec

## 2012-10-26 NOTE — Telephone Encounter (Signed)
Spoke with pt and gave her information from Dr. Clifton James. She will stop lopressor for a month and then call us to let us know if this has helped with her asthma symptoms.

## 2012-10-26 NOTE — Telephone Encounter (Signed)
New problem    Need to discuss medication(metoprolol tartrate) that is making her cough x20yr and med also triggering asthma

## 2012-10-26 NOTE — Telephone Encounter (Signed)
Pt was seen

## 2012-10-26 NOTE — Assessment & Plan Note (Signed)
Rev diff incl asthma/gerd/ infection - is chronic for a year  cxr today Pt will disc poss of beta blocker worsening asthma with her cardiologist also

## 2012-10-27 ENCOUNTER — Telehealth: Payer: Self-pay

## 2012-10-27 ENCOUNTER — Telehealth: Payer: Self-pay | Admitting: Family Medicine

## 2012-10-27 NOTE — Telephone Encounter (Signed)
Pt request call back with cxr report.Please advise.

## 2012-10-27 NOTE — Telephone Encounter (Signed)
Thanks for the update- chest xray and labs are ok - comment is in results for each

## 2012-10-27 NOTE — Telephone Encounter (Signed)
Patient Information:  Caller Name: Kelli Velasquez  Phone: [phone]  Patient: Kelli Velasquez, Kelli Velasquez  Gender: Female  DOB: Mar 15, 1941  Age: 72 Years  PCP: Tower, [employer] (Family [facility])  Office Follow Up:  Does the office need to follow up with this patient?: Yes  Instructions For The Office: Please follow up with patient regarding CXR and notify PCP that Metoprolol was discontinued for one month to see if there is improvement in asthma symptoms.  Thank you.   Symptoms  Reason For Call & Symptoms: Patient calling with two concerns. First she would like results from CXR done 10/26/12. Second she wants to inform Dr. Milinda Antis that cardiologist has discontinued Metoprolol for one month due to its suspected triggering of her asthma symptoms.  Reviewed Health History In EMR: Yes  Reviewed Medications In EMR: Yes  Reviewed Allergies In EMR: Yes  Reviewed Surgeries / Procedures: Yes  Date of Onset of Symptoms: Unknown  Guideline(s) Used:  No Protocol Available - Information Only  Disposition Per Guideline:   Discuss with PCP and Callback by Nurse Today  Reason For Disposition Reached:   Nursing judgment  Advice Given:  Call Back If:  New symptoms develop  You become worse.  Patient Will Follow Care Advice:  YES

## 2012-11-01 NOTE — Telephone Encounter (Signed)
See xray report; pt notified.

## 2012-11-07 ENCOUNTER — Telehealth: Payer: Self-pay | Admitting: Family Medicine

## 2012-11-07 DIAGNOSIS — R05 Cough: Secondary | ICD-10-CM

## 2012-11-07 NOTE — Telephone Encounter (Signed)
Pt.notified

## 2012-11-07 NOTE — Telephone Encounter (Signed)
I referred to ENT Curious to see if they can also help with wheezing - ? If allergies or reflux causing that

## 2012-11-07 NOTE — Telephone Encounter (Signed)
Caller: Junette/Patient; Phone: [phone]; Reason for Call: Pt is reporting back that her cough is unchanged.  Her cardiologist d/c Metoprolol for the cough 2 weeks ago but the cough continues.  She was to call back and let Dr.  Milinda Antis know.  Pt states that she has more energy and is sleeping better.  Pt is wondering if allergies are a part of her cough. Pt is going on a [place] cruise next week on 11/16/12. So is hoping MD will call her back and advise her on next step.

## 2012-11-07 NOTE — Telephone Encounter (Signed)
Pt said she is already on an antihistamine, but she agrees with the referral to ENT, I advised her that Shirlee Limerick will call her to set up appt, pt forgot to let the CAN-nurse know that she also has been wheezing a lot more and has been using her inhaler more than usual and wanted to know if you want her to try/do anything else, please advise

## 2012-11-07 NOTE — Addendum Note (Signed)
Addended by: Roxy Manns A on: 11/07/2012 01:36 PM   Modules accepted: Orders

## 2012-11-07 NOTE — Telephone Encounter (Signed)
It could be allergy related  If she is not on an antihistamine- can try zyrtec 10 mg once daily otc  Also I would like to refer her to ENT to see if they can help Please let me know if agreeable

## 2012-11-08 ENCOUNTER — Telehealth: Payer: Self-pay | Admitting: Family Medicine

## 2012-11-08 NOTE — Telephone Encounter (Signed)
Patient Information:  Caller Name: Kelli Velasquez  Phone: [phone]  Patient: Kelli, Velasquez  Gender: Female  DOB: 07-Jul-1941  Age: 72 Years  PCP: Roxy Manns [facility])  Office Follow Up:  Does the office need to follow up with this patient?: No  Instructions For The Office: N/A   Symptoms  Reason For Call & Symptoms: Kelli Velasquez states she received a call from office on 11/07/12 stating Dr.Tower was ordering a referral to an ENT. States she expected a return call on 11/07/12 or 11/08/12 with date for appointment with ENT and has not received callback. Requesting to see ENT prior to 11/16/12 -she will be leaving for a cruise on 11/16/12 that extends to 12/02/12. Transferred to [facility] regarding referral.  Reviewed Health History In EMR: Yes  Reviewed Medications In EMR: Yes  Reviewed Allergies In EMR: Yes  Reviewed Surgeries / Procedures: Yes  Date of Onset of Symptoms: Unknown  Guideline(s) Used:  No Protocol Available - Sick Adult  Disposition Per Guideline:   Home Care  Reason For Disposition Reached:   Patient's symptoms are safe to treat at home per nursing judgment  Advice Given:  Call Back If:  You become worse.  Patient Will Follow Care Advice:  YES

## 2012-11-10 ENCOUNTER — Ambulatory Visit (INDEPENDENT_AMBULATORY_CARE_PROVIDER_SITE_OTHER): Payer: Medicare Other | Admitting: Family Medicine

## 2012-11-10 ENCOUNTER — Encounter: Payer: Self-pay | Admitting: Family Medicine

## 2012-11-10 VITALS — BP 120/84 | HR 78 | Temp 98.2°F | Ht 66.0 in | Wt 245.5 lb

## 2012-11-10 DIAGNOSIS — R05 Cough: Secondary | ICD-10-CM

## 2012-11-10 MED ORDER — GUAIFENESIN-CODEINE 100-10 MG/5ML PO SYRP: 5.0000 mL | ORAL_SOLUTION | Freq: Every evening | ORAL | Status: DC | PRN

## 2012-11-10 NOTE — Patient Instructions (Addendum)
I don't think this is asthma flare or infection. Let's increase prilosec to 2 pills daily. Continue albuterol inhaler 3-4 times daily. Refilled codeine cough syrup. Let's give this a few more days, would expect improvement. If worsening productive cough, fever, let me know for antibiotic course. We'll wait to see what ENT says on Monday.

## 2012-11-10 NOTE — Progress Notes (Signed)
  Subjective:    Patient ID: Kelli Velasquez, female    DOB: 1941-03-09, 72 y.o.   MRN: [id number]  HPI CC: cough, wheezing  H/o asthma, allergic rhinitis, GERD.    Sxs ongoing for the last year.  ?asthma, GERD or allergy related.  Has tried PPI, antihistamine, albuterol which have helped. Metoprolol was stopped 10/27/2012 - didn't help at all. Saw PCP 10/27/2012, at that time lungs were clear and CXR clear.  Treated with albuterol, codeine cough syrup, and delsym.  Worsening cough and shortness of breath with wheezing for last 3 days.  Productive of white sputum.   No fevers/chills, congestion or headaches, abd pain. No sick contacts at home.  No smokers at home. Ex smoker - quit 1984  Bad week for allergies.  Sees allergist in [place]. Asthma has been stable for 20 years. Using albuterol regularly TID for last 3 days, once at night.  Past Medical History  Diagnosis Date  . Myocardial infarction     subendocardial, initial episode  . Hyperlipidemia   . Hypertension   . Obesity   . Neoplasm of skin     neoplasm of uncertain behavior of skin  . Vertigo   . Myopia   . Overactive bladder   . Urinary incontinence   . Osteoarthritis   . Asthma   . Allergy     allergic rhinitis  . Rash     and other non specific skin eruptions  . Gallstones   . Coronary artery disease     cath January 2011 with DEs LAD and RCA     Review of Systems Per HPI    Objective:   Physical Exam  Nursing note and vitals reviewed. Constitutional: She appears well-developed and well-nourished. No distress.  HENT:  Head: Normocephalic and atraumatic.  Mouth/Throat: Oropharynx is clear and moist. No oropharyngeal exudate.  Neck: No JVD present.  Cardiovascular: Normal rate, regular rhythm, normal heart sounds and intact distal pulses.   No murmur heard. Pulmonary/Chest: Effort normal and breath sounds normal. No respiratory distress. She has no wheezes. She has no rhonchi. She has no rales.   overall good air movement Faint LLL crackles  Musculoskeletal: She exhibits no edema.       Assessment & Plan:

## 2012-11-10 NOTE — Assessment & Plan Note (Addendum)
PF 350 today, expected for age and height ~400.  87.5% expected. Points against asthma as cause of cough. Not consistent with bacterial cause. ?gerd vs viral URI - recommended she use albuterol regularly for next 3 nights and increase omeprazole to 40mg  daily. Will await ENT eval. If worsening to return or seek urgent care

## 2012-11-15 ENCOUNTER — Telehealth: Payer: Self-pay

## 2012-11-15 MED ORDER — AZITHROMYCIN 250 MG PO TABS: ORAL_TABLET | ORAL | Status: DC

## 2012-11-15 NOTE — Telephone Encounter (Signed)
Pt notified Rx sent into pharmacy, and to updated [place] if no improvement, left voicemail requesting OV note with Medical records from Dr. Isaias Cowman) office

## 2012-11-15 NOTE — Telephone Encounter (Signed)
Patient Information:  Caller Name: Jarah  Phone: [phone]  Patient: Kelli Velasquez, Kelli Velasquez  Gender: Female  DOB: Nov 07, 1940  Age: 72 Years  PCP: Tower, [employer] [facility])  Office Follow Up:  Does the office need to follow up with this patient?: No  Instructions For The Office: N/A  RN Note:  Checked EPIC and Dr. Milinda Antis had addressed the note, but no one had called the patient back since Dr. Milinda Antis had given instructions @ 1:38 pm. Advised Patient the a prescription for a Z-Pac azithromycin) was sent to her pharmacy ([facility] [place]) and that she was to call the office back with an update if she was not improving. Patient Agreed. Stated that she did not need anything else at this time.  Symptoms  Reason For Call & Symptoms: Patient calling about her cough and that she is still not feeling good since her office visit on 11/10/12. Checked EPIC and patient had called the office earlier this morning and a note was sent to Dr. Milinda Antis to let her know that the patient had seen the ENT yesterday and he gave her Zantac for Reflux symptoms. Patient was up all night coughing and the Codeine Cough Syrup and the Zantac did not help her at all. Patient requested Antibiotics from Dr. Milinda Antis.  Reviewed Health History In EMR: Yes  Reviewed Medications In EMR: Yes  Reviewed Allergies In EMR: Yes  Reviewed Surgeries / Procedures: Yes  Date of Onset of Symptoms: 11/10/2012  Guideline(s) Used:  No Protocol Available - Information Only  Disposition Per Guideline:   Home Care  Reason For Disposition Reached:   Information only question and nurse able to answer  Advice Given:  N/A  Patient Will Follow Care Advice:  YES

## 2012-11-15 NOTE — Telephone Encounter (Signed)
Received OV note from ENT and placed in your inbox

## 2012-11-15 NOTE — Telephone Encounter (Signed)
Pt saw ENT on 11/14/12; pt was told slight evidence of acid reflux and was given Zantac. Pt was up all night coughing; Zantac and codeine cough med did not help cough last night; pt has non productive cough and no fever. Pt request antibiotics Dr Reece Agar spoke about at 11/10/12 office visit to [facility]. Pt said she had to get some sleep tonight.Please advise.

## 2012-11-15 NOTE — Telephone Encounter (Signed)
Please send for ENT note  I will send Zpak to her drug store Update if not improving

## 2012-11-15 NOTE — Telephone Encounter (Signed)
I did call pt and advised her of this information

## 2012-11-18 ENCOUNTER — Telehealth: Payer: Self-pay | Admitting: Family Medicine

## 2012-11-18 DIAGNOSIS — R05 Cough: Secondary | ICD-10-CM

## 2012-11-18 MED ORDER — ALBUTEROL SULFATE HFA 108 (90 BASE) MCG/ACT IN AERS: 2.0000 | INHALATION_SPRAY | RESPIRATORY_TRACT | Status: DC | PRN

## 2012-11-18 MED ORDER — HYDROCOD POLST-CHLORPHEN POLST 10-8 MG/5ML PO LQCR: 5.0000 mL | Freq: Two times a day (BID) | ORAL | Status: DC | PRN

## 2012-11-18 NOTE — Telephone Encounter (Signed)
Rx called in as prescribed 

## 2012-11-18 NOTE — Telephone Encounter (Signed)
Pt said Kelli Velasquez just scheduled appt with pulmonologist for 12/05/12 and pt cannot wait that long. Pt said all she can do is cough and needs to see pulmonologist ASAP. Pt has been coughing for 3-4 weeks.pt request call back today.

## 2012-11-18 NOTE — Telephone Encounter (Signed)
I spoke to patient -the robitussin AC helps some but not totally- and still coughs all day and night  Discussed alternatives - will try tussionex (warned pt that this can be quite sedating) Also needs refil of her inhaler Please call in medicines - she is aware already

## 2012-11-18 NOTE — Telephone Encounter (Signed)
Pt notified of referral, pt wants to know if she can get her cough med refilled and her inhaler refilled so she can get through the weekend until she gets her appt with the pulmonologist

## 2012-11-18 NOTE — Telephone Encounter (Signed)
I agree - will refer to pulmonary- let her know Shirlee Limerick will be calling

## 2012-11-18 NOTE — Telephone Encounter (Signed)
When I called pt to let her know Shirlee Limerick will be calling to set up referral pt starting yelling asking why did it take so long for Dr. Milinda Antis to respond to her phone call, I advise pt that Dr. Milinda Antis sees pt during the day and gets to her phone calls at lunch, pt said that is unacceptable and said she was upset that she waited so late to put the pulmonologist referral in that she wouldn't be able to get in today, I advise pt that with this type of referral she wouldn't be able to get into see the pulmonologist today. Pt got upset and said what is she suppose to do about her cough until then, that's when I sent Dr. Milinda Antis request to refill pt's cough med and inhaler

## 2012-11-18 NOTE — Telephone Encounter (Signed)
Patient Information:  Caller Name: Solei  Phone: [phone]  Patient: Kelli Velasquez, Kelli Velasquez  Gender: Female  DOB: 1940-10-09  Age: 72 Years  PCP: Roxy Manns [facility])  Office Follow Up:  Does the office need to follow up with this patient?: Yes  Instructions For The Office: Patient triages to be seen. Multiple appt and medications for the cough.  She states she was told next step was Pulmonology. Please contact patient  RN Note:  Patient triages to be seen. Multiple appt and medications for the cough.  She states she was told next step was Pulmonology. Please contact patient  Symptoms  Reason For Call & Symptoms: Patient states she has been battling cough for a year. She is being treated by Dr. Milinda Antis.  Three weeks ago worsening. Afebrile. +coughing +productive white, no sneezing, Can't sleep.  Multiple treatments- zithromax which she takes last pill tomorrow.  Saw ENT and placed on Prilosec and Zantac which made a little differnece but HAS NOT HELPED THE COUGH. She states she does not want to make appt because she states the next step was Pulmonary consult  Reviewed Health History In EMR: Yes  Reviewed Medications In EMR: Yes  Reviewed Allergies In EMR: Yes  Reviewed Surgeries / Procedures: No  Date of Onset of Symptoms: 10/28/2012  Treatments Tried: Cheratussin , Albuterol, Zpack  Treatments Tried Worked: No  Guideline(s) Used:  Asthma Attack  Disposition Per Guideline:   See Today or Tomorrow in Office  Reason For Disposition Reached:   Mild asthma attack (e.g., no SOB at rest, mild SOB with walking, speaks normally in sentences, mild wheezing) and persists > 24 hours on appropriate treatment  Advice Given:  Drinking Liquids:  Try to drink normal amount of liquids (e.g., water). Being adequately hydrated makes it easier to cough up the sticky lung mucus.  Humidifier:   If the air is dry, use a cool mist humidifier to prevent drying of the upper airway.  Call Back If:  Inhaled asthma medicine (nebulizer or inhaler) is needed more often than every 4 hours  You become worse.  RN Overrode Recommendation:  Follow Up With Office Later  Patient triages to be seen. Multiple appt and medications for the cough.  She states she was told next step was Pulmonology. Please contact patient

## 2012-11-18 NOTE — Telephone Encounter (Signed)
Dr. Milinda Antis, Spoke with the patient and gave her the new referral time Shirlee Limerick was able to get for her: 5/5 at 2:15pm with Dr. Sandrea Hughs.  Patient wants to know what she should do for the 9 days before that appointment for her cough.  Please call patient Thanks

## 2012-11-23 ENCOUNTER — Telehealth: Payer: Self-pay | Admitting: Internal Medicine

## 2012-11-23 NOTE — Telephone Encounter (Signed)
Spoke with the pt's spouse  He states needing a sooner consult with any MD at any office Her cough continues to get worse  I have cancelled consult with MW for next wk, and scheduled pt to see him tomorrow am at 9  Needs to arrive at 8:45  Nothing further needed per spouse

## 2012-11-24 ENCOUNTER — Other Ambulatory Visit: Payer: Self-pay | Admitting: Adult Health

## 2012-11-24 ENCOUNTER — Other Ambulatory Visit (INDEPENDENT_AMBULATORY_CARE_PROVIDER_SITE_OTHER): Payer: Medicare Other

## 2012-11-24 ENCOUNTER — Institutional Professional Consult (permissible substitution): Payer: Medicare Other | Admitting: Internal Medicine

## 2012-11-24 ENCOUNTER — Ambulatory Visit (INDEPENDENT_AMBULATORY_CARE_PROVIDER_SITE_OTHER)
Admission: RE | Admit: 2012-11-24 | Discharge: 2012-11-24 | Disposition: A | Discharge status: Home / Self Care | Payer: Medicare Other | Source: Ambulatory Visit | Attending: Adult Health | Admitting: Adult Health

## 2012-11-24 ENCOUNTER — Ambulatory Visit (INDEPENDENT_AMBULATORY_CARE_PROVIDER_SITE_OTHER): Payer: Medicare Other | Admitting: Adult Health

## 2012-11-24 ENCOUNTER — Encounter: Payer: Self-pay | Admitting: Adult Health

## 2012-11-24 ENCOUNTER — Encounter: Payer: Self-pay | Admitting: *Deleted

## 2012-11-24 VITALS — BP 148/64 | HR 99 | Temp 98.0°F | Ht 66.0 in | Wt 252.6 lb

## 2012-11-24 DIAGNOSIS — I2510 Atherosclerotic heart disease of native coronary artery without angina pectoris: Secondary | ICD-10-CM

## 2012-11-24 DIAGNOSIS — R05 Cough: Secondary | ICD-10-CM

## 2012-11-24 DIAGNOSIS — R0902 Hypoxemia: Secondary | ICD-10-CM

## 2012-11-24 LAB — BRAIN NATRIURETIC PEPTIDE: Pro B Natriuretic peptide (BNP): 40 pg/mL (ref 0.0–100.0)

## 2012-11-24 LAB — BASIC METABOLIC PANEL
CO2: 33 mEq/L — ABNORMAL HIGH (ref 19–32)
Chloride: 98 mEq/L (ref 96–112)
Potassium: 4.2 mEq/L (ref 3.5–5.1)

## 2012-11-24 LAB — CBC WITH DIFFERENTIAL/PLATELET
Basophils Absolute: 0.1 10*3/uL (ref 0.0–0.1)
Basophils Relative: 0.4 % (ref 0.0–3.0)
Eosinophils Absolute: 0.7 10*3/uL (ref 0.0–0.7)
Lymphocytes Relative: 11.5 % — ABNORMAL LOW (ref 12.0–46.0)
MCHC: 34.2 g/dL (ref 30.0–36.0)
Monocytes Relative: 5.5 % (ref 3.0–12.0)
Neutrophils Relative %: 77.7 % — ABNORMAL HIGH (ref 43.0–77.0)
RBC: 4.59 Mil/uL (ref 3.87–5.11)
RDW: 15.9 % — ABNORMAL HIGH (ref 11.5–14.6)

## 2012-11-24 LAB — D-DIMER, QUANTITATIVE: D-Dimer, Quant: 0.53 ug/mL-FEU — ABNORMAL HIGH (ref 0.00–0.48)

## 2012-11-24 MED ORDER — LEVALBUTEROL HCL 0.63 MG/3ML IN NEBU
0.6300 mg | INHALATION_SOLUTION | Freq: Once | RESPIRATORY_TRACT | Status: AC
  Administered 2012-11-24: 0.63 mg via RESPIRATORY_TRACT

## 2012-11-24 MED ORDER — BENZONATATE 200 MG PO CAPS: 200.0000 mg | ORAL_CAPSULE | Freq: Three times a day (TID) | ORAL | Status: DC | PRN

## 2012-11-24 MED ORDER — PREDNISONE 10 MG PO TABS: ORAL_TABLET | ORAL | Status: DC

## 2012-11-24 MED ORDER — FUROSEMIDE 20 MG PO TABS: 20.0000 mg | ORAL_TABLET | Freq: Every day | ORAL | Status: DC | PRN

## 2012-11-24 MED ORDER — HYDROCODONE-HOMATROPINE 5-1.5 MG/5ML PO SYRP: 5.0000 mL | ORAL_SOLUTION | Freq: Four times a day (QID) | ORAL | Status: DC | PRN

## 2012-11-24 NOTE — Assessment & Plan Note (Signed)
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  CXR today shows CM w/ no acute infiltrates or edema  Spirometry not convincing this is asthma -however will have her return for PFT . No evidence of airflow obstruction.  >give short dose of steroids along with cough control regimen along with  AR/GERD prevention regimen.  Labs show neg bnp  ? Mild fluid overload with diastolic dysfunction w/ LE edema -  Will give gently diuresis x 3 days with lasix  WBC slightly elevated ? Significance.  If not improving , check CT sinus to r/o sinus dz  D. Dimer is elevated -doubt PE however desats with walking and freq.  Air travel. >set up CT chest -PE protocol.  Desats with ambulation ? Etiology - check ONO ? OSA  Will recheck on return -ambulatory walk after steroid and lasix.   Will have her come back for close follow up with PFT   Plan   will call this evening with your xray and lab results.  We are setting you up for an overnight oxygen check.  Follow up Dr. Sherene Sires  In 2-3 weeks    Late add -instructions emailed to pt  Prednisone taper.  Stop all fish/flax seed oil capsules.  Begin Delsym 2 tsp Twice daily  For cough  Begin Tessalon Three times a day  For cough  May use Hydromet 1 tsp every 6hr as needed for cough. -may make you sleepy.  Begin Chlortrimeton 4mg  1 in am and 2 At bedtime   Continue on Prilosec 20mg  Twice daily   Continue on Zantac At bedtime   Lasix 20mg  daily x 3 days then As needed  For leg swelling.  Mucinex Twice daily for congestion .  Saline nasal rinses As needed   Follow up with Dr. Sherene Sires  In 2 weeks with PFT .   Set up CT chest -PE protocol.    Case discussed with Dr. Maple Hudson  In Dr. Sherene Sires  Absence   Appreciate consult opportunity from Dr. Milinda Antis

## 2012-11-24 NOTE — Progress Notes (Signed)
Subjective:    Patient ID: Kelli Velasquez, female    DOB: 10-12-1940, 72 y.o.   MRN: [id number]  HPI 72 yo female with h/o CAD, HTN, OA and obesity presents for an initial pulmonary consult for cough x 6 weeks and recurrent bronchitis flares x 1 year ~10/2011.    PCP : Dr.  Milinda Antis   11/24/2012 Initial pulmonary consult  Complains of chronic cough  For 4-6 weeks .  Referred by Dr Milinda Antis  Reports coughing spells for approx 1 month, began 1 years ago.  This one is the longest w/ occ white mucus prod, occ wheezing.   Denies SOB, head congestion, watery eyes, f/c/s Has been treated with abx without any help.  CXR on 4/3 showed no acute process.  Given cough syrup with tussionex -some help.  Metoprolol was stopped ? Asthma flare d/t nonselective BB effect.  Did not see any improvement in cough, but energy level better off BB.  Cough is day and night, rattling cough but minimally productive. Keeps her up at night.  No fever, night sweats, hemoptysis, wt loss.   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  Started on Prilosec Twice daily  And Zantac At bedtime   Denies significant dyspnea, however can not walk long distances , [place] wheelchair in airport  Does water aerobics - on reg basis. Unable to go lately due to cough .   Denies orthopnea but sleeping in recliner last few weeks due to cough and wheezing in supine position.  No calf pain , chest pain or hemoptysis No increased leg swelling but has ankle edema chronically  Does have snoring , no sign daytime sleepiness.   5/1  Office walk showed desats 86% on RA .  5/1  spirometry FEV1 nml-73% , ratio 80   PMH:  -Hx of NSTEMI.07/2009 w/  cardiac cath s/p 2 stents in LAD/ RCA       >>Echo at that time showed Mild LVH, EF of 55% and grade 1 diastolic dysfuntion  -Hyperlipidemia -HTN  -OA -s/p bilat. Knee replacements - Obesity    SH:  Retired from [employer] work Former  smoker -quit 1984 -smoked x 20 1 PPD  Married  No pets  No unusual hobbies  Travels on vacation frequently (1-10/2012 -traveled to [place], [place] and [place] )    FH :  Stroke   Review of Systems Constitutional:   No  weight loss, night sweats,  Fevers, chills,  +fatigue, or  lassitude.  HEENT:   No headaches,  Difficulty swallowing,  Tooth/dental problems, or  Sore throat,                No sneezing, itching, ear ache,  +nasal congestion, post nasal drip,   CV:  No chest pain,  Orthopnea, PND,  , anasarca, dizziness, palpitations, syncope.   GI  No heartburn, indigestion, abdominal pain, nausea, vomiting, diarrhea, change in bowel habits, loss of appetite, bloody stools.   Resp:    No coughing up of blood.  No change in color of mucus.     No chest wall deformity  Skin: no rash or lesions.  GU: no dysuria, change in color of urine, no urgency or frequency.  No flank pain, no hematuria   MS   No decreased range of motion.  No back pain.  Psych:  No change in mood or affect. No depression or anxiety.  No memory loss.  Objective:   Physical Exam GEN: A/Ox3; pleasant , NAD, obese WF   HEENT:  [place]/AT,  EACs-clear, TMs-wnl, NOSE-clear, THROAT-clear, no lesions, no postnasal drip or exudate noted.   NECK:  Supple w/ fair ROM; no JVD; normal carotid impulses w/o bruits; no thyromegaly or nodules palpated; no lymphadenopathy.  RESP  Clear  P & A; w/o, wheezes/ rales/ or rhonchi.no accessory muscle use, no dullness to percussion  CARD:  RRR, no m/r/g  , 1+ peripheral edema, pulses intact, no cyanosis or clubbing., neg homans sign   GI:   Soft & nt; nml bowel sounds; no organomegaly or masses detected.  Musco: Warm bil, no deformities or joint swelling noted.  Bilateral knee scars  Neuro: alert, no focal deficits noted.    Skin: Warm, no lesions or rashes         Assessment & Plan:

## 2012-11-24 NOTE — Patient Instructions (Addendum)
I will call this evening with your xray and lab results.  We are setting you up for an overnight oxygen check.  Follow up Dr. Sherene Sires  In 2-3 weeks    Late add -instructions emailed to pt  Prednisone taper.  Stop all fish/flax seed oil capsules.  Begin Delsym 2 tsp Twice daily  For cough  Begin Tessalon Three times a day  For cough  May use Hydromet 1 tsp every 6hr as needed for cough. -may make you sleepy.  Begin Chlortrimeton 4mg  1 in am and 2 At bedtime   Continue on Prilosec 20mg  Twice daily   Continue on Zantac At bedtime   Lasix 20mg  daily x 3 days then As needed  For leg swelling.  Mucinex Twice daily for congestion .  Saline nasal rinses As needed   Follow up with Dr. Sherene Sires  In 2 weeks with PFT .   Set up CT chest -PE protocol.

## 2012-11-25 ENCOUNTER — Telehealth: Payer: Self-pay | Admitting: Family Medicine

## 2012-11-25 ENCOUNTER — Encounter: Payer: Self-pay | Admitting: Adult Health

## 2012-11-25 ENCOUNTER — Ambulatory Visit (INDEPENDENT_AMBULATORY_CARE_PROVIDER_SITE_OTHER)
Admission: RE | Admit: 2012-11-25 | Discharge: 2012-11-25 | Disposition: A | Discharge status: Home / Self Care | Payer: Medicare Other | Source: Ambulatory Visit | Attending: Adult Health | Admitting: Adult Health

## 2012-11-25 DIAGNOSIS — R05 Cough: Secondary | ICD-10-CM

## 2012-11-25 DIAGNOSIS — R059 Cough, unspecified: Secondary | ICD-10-CM

## 2012-11-25 MED ORDER — IOHEXOL 350 MG/ML SOLN
100.0000 mL | Freq: Once | INTRAVENOUS | Status: AC | PRN
  Administered 2012-11-25: 100 mL via INTRAVENOUS

## 2012-11-25 NOTE — Telephone Encounter (Signed)
Florestine dropped off a form for Dr. Milinda Antis to fill out . She stated that this was an insurance form.

## 2012-11-25 NOTE — Telephone Encounter (Signed)
Done and in IN box 

## 2012-11-25 NOTE — Progress Notes (Signed)
Quick Note:  Pt aware per TP ______

## 2012-11-25 NOTE — Telephone Encounter (Signed)
Form placed in your inbox

## 2012-11-25 NOTE — Progress Notes (Signed)
Quick Note:  Pt aware per TP. ______ 

## 2012-11-28 ENCOUNTER — Institutional Professional Consult (permissible substitution): Payer: Medicare Other | Admitting: Internal Medicine

## 2012-11-28 NOTE — Telephone Encounter (Signed)
Pt's husband notified Form ready for pick-up

## 2012-12-01 ENCOUNTER — Telehealth: Payer: Self-pay | Admitting: Cardiovascular Disease

## 2012-12-01 NOTE — Telephone Encounter (Signed)
Spoke with pt. Metoprolol was stopped on 10/26/2012 to see if this was contributing to pt's ongoing cough (see phone note 4/2). Pt reports stopping this did not help cough and she is currently being seen by Pulmonary and ENT.  She notes increase in energy level off metoprolol. She will stay off metoprolol for now.  She reports she was on airplane flying over [place] at the end of March and at high altitude developed muscle cramps in arms and legs.  These subsided as soon as she descended.  She is flying again at the end of the month (to [place]) and would like Dr. Gibson Ramp input regarding cramps before flying again.

## 2012-12-01 NOTE — Telephone Encounter (Signed)
New problem   Pt giving you update on medication

## 2012-12-01 NOTE — Telephone Encounter (Signed)
OK to hold metoprolol. I have no recs for cramps before flying. cdm

## 2012-12-01 NOTE — Telephone Encounter (Signed)
Spoke with pt and gave her information from Dr. McAlhany 

## 2012-12-05 ENCOUNTER — Institutional Professional Consult (permissible substitution): Payer: Medicare Other | Admitting: Pulmonary Disease

## 2012-12-13 ENCOUNTER — Telehealth: Payer: Self-pay | Admitting: Adult Health

## 2012-12-13 ENCOUNTER — Other Ambulatory Visit: Payer: Self-pay | Admitting: Adult Health

## 2012-12-13 ENCOUNTER — Encounter: Payer: Self-pay | Admitting: Adult Health

## 2012-12-13 DIAGNOSIS — R918 Other nonspecific abnormal finding of lung field: Secondary | ICD-10-CM

## 2012-12-13 DIAGNOSIS — J45909 Unspecified asthma, uncomplicated: Secondary | ICD-10-CM

## 2012-12-13 NOTE — Telephone Encounter (Signed)
ONO results received from [facility] Per TP: +ONO  Begin nocturnal O2 @ 2L/min.  ? Consider sleep study at return ov  Called spoke with patient, advised of ONO results / recs as stated by TP above. Pt verbalized her understanding  Has upcoming ov w/ MW on 5.23.14 and pt is aware will discuss results further at that time Called [facility], spoke with Larita Fife and gave verbal order for new nocturnal O2 start Order placed in epic Nothing further needed at this time; will sign off ONO results given to [facility] for upcoming appt

## 2012-12-13 NOTE — Progress Notes (Signed)
Quick Note:  Orders only encounter created for CT in 6 months ______

## 2012-12-13 NOTE — Progress Notes (Signed)
Result Notes    Notes Recorded by Sherre Lain, MA on 12/13/2012 at 4:59 PM Orders only encounter created for CT in 6 months ------  Notes Recorded by Sherre Lain, MA on 11/25/2012 at 4:04 PM Pt aware per TP ------  Notes Recorded by Julio Sicks, NP on 11/25/2012 at 3:45 PM CT chest is neg for blood clot /PE  Scattered nodules - nonspecific -will need follow up CT in 6 months

## 2012-12-16 ENCOUNTER — Encounter: Payer: Self-pay | Admitting: Internal Medicine

## 2012-12-16 ENCOUNTER — Ambulatory Visit (INDEPENDENT_AMBULATORY_CARE_PROVIDER_SITE_OTHER): Payer: Medicare Other | Admitting: Internal Medicine

## 2012-12-16 VITALS — BP 170/90 | HR 100 | Temp 97.1°F | Ht 65.0 in | Wt 246.0 lb

## 2012-12-16 DIAGNOSIS — R0902 Hypoxemia: Secondary | ICD-10-CM

## 2012-12-16 DIAGNOSIS — R05 Cough: Secondary | ICD-10-CM

## 2012-12-16 NOTE — Progress Notes (Signed)
Subjective:    Patient ID: Kelli Velasquez, female    DOB: Mar 13, 1941, 72 y.o.   MRN: [id number]  HPI 72 yowf quit smoking in 1984  with h/o CAD, HTN, OA and obesity presents for an initial pulmonary consult for cough x 6 weeks and recurrent bronchitis flares x 1 year ~10/2011.    PCP : Dr.  Milinda Antis   11/24/2012 Initial pulmonary consult  Complains of chronic cough  For 4-6 weeks .  Referred by Dr Milinda Antis  Reports coughing spells for approx 1 month, began 1 years ago.  This one is the longest w/ occ white mucus prod, occ wheezing.   Denies SOB, head congestion, watery eyes, f/c/s Has been treated with abx without any help.  CXR on 4/3 showed no acute process.  Given cough syrup with tussionex -some help.  Metoprolol was stopped ? Asthma flare d/t nonselective BB effect.  Did not see any improvement in cough, but energy level better off BB.  Cough is day and night, rattling cough but minimally productive. Keeps her up at night.  No fever, night sweats, hemoptysis, wt loss.   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  Started on Prilosec Twice daily  And Zantac At bedtime   Denies significant dyspnea, however can not walk long distances , [place] in airport  Does water aerobics - on reg basis. Unable to go lately due to cough .   Denies orthopnea but sleeping in recliner last few weeks due to cough and wheezing in supine position.  No calf pain , chest pain or hemoptysis No increased leg swelling but has ankle edema chronically  Does have snoring , no sign daytime sleepiness.   5/1  Office walk showed desats 86% on RA .  5/1  spirometry FEV1 nml-73% , ratio 80  rec Late add -instructions emailed to pt  Prednisone taper.  Stop all fish/flax seed oil capsules.  Begin Delsym 2 tsp Twice daily  For cough  Begin Tessalon Three times a day  For cough  May use Hydromet 1 tsp every 6hr as needed for cough. -may make you  sleepy.  Begin Chlortrimeton 4mg  1 in am and 2 At bedtime   Continue on Prilosec 20mg  Twice daily   Continue on Zantac At bedtime     12/16/2012 f/u ov/Jakell Trusty  Chief Complaint  Patient presents with  . Follow-up    Pt states that her cough is much improved since the last visit.   still feels the tickle in throat present x one year Takes chlortrimeton 4mg  1 every am and 2 at hs but most of her symptoms are daytime No need for saba at this point  No obvious daytime variabilty or assoc sob or cp or chest tightness, subjective wheeze overt sinus or hb symptoms. No unusual exp hx or h/o childhood pna/ asthma or premature birth to her knowledge.   Sleeping ok without nocturnal  or early am exacerbation  of respiratory  c/o's or need for noct saba. Also denies any obvious fluctuation of symptoms with weather or environmental changes or other aggravating or alleviating factors except as outlined above   Current Medications, Allergies, Past Medical History, Past Surgical History, Family History, and Social History were reviewed in [employer] [facility] record.  ROS  The following are not active complaints unless bolded sore throat, dysphagia, dental problems, itching, sneezing,  nasal congestion or excess/ purulent secretions,  ear ache,   fever, chills, sweats, unintended wt loss, pleuritic or exertional cp, hemoptysis,  orthopnea pnd or leg swelling, presyncope, palpitations, heartburn, abdominal pain, anorexia, nausea, vomiting, diarrhea  or change in bowel or urinary habits, change in stools or urine, dysuria,hematuria,  rash, arthralgias, visual complaints, headache, numbness weakness or ataxia or problems with walking or coordination,  change in mood/affect or memory.      PMH:  -Hx of NSTEMI.07/2009 w/  cardiac cath s/p 2 stents in LAD/ RCA       >>Echo at that time showed Mild LVH, EF of 55% and grade 1 diastolic dysfuntion  -Hyperlipidemia -HTN  -OA -s/p bilat. Knee  replacements - Obesity    SH:  Retired from [employer] work Former smoker -quit 1984 -smoked x 20 1 PPD  Married  No pets  No unusual hobbies  Travels on vacation frequently (1-10/2012 -traveled to [place], [place] and [place] )    FH :  Stroke         Objective:   Physical Exam GEN: A/Ox3; pleasant , NAD, obese WF with elevated bp otherwise nl vital signs  Wt Readings from Last 3 Encounters:  12/16/12 246 lb (111.585 kg)  11/24/12 252 lb 9.6 oz (114.579 kg)  11/10/12 245 lb 8 oz (111.358 kg)     HEENT:  [place]/AT,  EACs-clear, TMs-wnl, NOSE-clear, THROAT-clear, no lesions, no postnasal drip or exudate noted. pseudowheeze minimal  Edentulous   NECK:  Supple w/ fair ROM; no JVD; normal carotid impulses w/o bruits; no thyromegaly or nodules palpated; no lymphadenopathy.  RESP  Clear  P & A; w/o, wheezes/ rales/ or rhonchi.no accessory muscle use, no dullness to percussion  CARD:  RRR, no m/r/g  , 1+ peripheral edema, pulses intact, no cyanosis or clubbing., neg homans sign   GI:   Soft & nt; nml bowel sounds; no organomegaly or masses detected.  Musco: Warm bil, no deformities or joint swelling noted.  Bilateral knee scars    CT 11/25/12 1. Negative for pulmonary embolus.  2. Scattered small pulmonary nodules are nonspecific. If the  patient is at high risk for bronchogenic carcinoma, follow-up chest  CT at 3-6 months is recommended. If the patient is at low risk for  bronchogenic carcinoma, follow-up chest CT at 6-12 months is  recommended. This recommendation follows the consensus statement:  Guidelines for Management of Small Pulmonary Nodules Detected on CT  Scans: A Statement from the Fleischner Society as published in  Radiology 2005; 237:395-400.  3. Borderline enlarged AP window lymph node is nonspecific.  4. Coronary artery calcification.  5. Question hepatic steatosis.         Assessment & Plan:

## 2012-12-16 NOTE — Patient Instructions (Addendum)
Self monitor blood pressure - target is less than 140 over 85  Change chlortrimeton to 4mg   Up to every 4 hours as needed   Stay on acid suppression  GERD (REFLUX)  is an extremely common cause of respiratory symptoms, many times with no significant heartburn at all.    It can be treated with medication, but also with lifestyle changes including avoidance of late meals, excessive alcohol, smoking cessation, and avoid fatty foods, chocolate, peppermint, colas, red wine, and acidic juices such as orange juice.  NO MINT OR MENTHOL PRODUCTS SO NO COUGH DROPS  USE SUGARLESS CANDY INSTEAD (jolley ranchers or Stover's)  NO OIL BASED VITAMINS - use powdered substitutes.    Please schedule a follow up office visit in 6 weeks, call sooner if needed with pft's routine

## 2012-12-18 DIAGNOSIS — R05 Cough: Secondary | ICD-10-CM | POA: Insufficient documentation

## 2012-12-18 NOTE — Assessment & Plan Note (Addendum)
11/24/2012 Office walk showed desats 86% on RA  With  spirometry FEV1 nml-73% , ratio 80   11/25/2012 CTA Chest >>neg for PE/ ILD  12/07/12 ONO >+ begin O2 2 l/m At bedtime  12/13/2012    Likely this is secondary to obesity > work on wt loss, rec repeat study on 022 2lpm

## 2012-12-18 NOTE — Assessment & Plan Note (Signed)
Residual throat tickle highly suggestive of  Classic Upper airway cough syndrome, so named because it's frequently impossible to sort out how much is  CR/sinusitis with freq throat clearing (which can be related to primary GERD)   vs  causing  secondary (" extra esophageal")  GERD from wide swings in gastric pressure that occur with throat clearing, often  promoting self use of mint and menthol lozenges that reduce the lower esophageal sphincter tone and exacerbate the problem further in a cyclical fashion.   These are the same pts (now being labeled as having "irritable larynx syndrome" by some cough centers) who not infrequently have a history of having failed to tolerate ace inhibitors,  dry powder inhalers or biphosphonates or report having atypical reflux symptoms that don't respond to standard doses of PPI , and are easily confused as having aecopd or asthma flares by even experienced allergists/ pulmonologists.   For now continue gerd rx and optimize 1st gen H1 per guidelines

## 2012-12-20 ENCOUNTER — Encounter: Payer: Self-pay | Admitting: Internal Medicine

## 2012-12-23 ENCOUNTER — Institutional Professional Consult (permissible substitution): Payer: Medicare Other | Admitting: Internal Medicine

## 2013-01-06 ENCOUNTER — Other Ambulatory Visit: Payer: Self-pay | Admitting: Adult Health

## 2013-01-24 ENCOUNTER — Encounter: Payer: Self-pay | Admitting: Internal Medicine

## 2013-01-31 ENCOUNTER — Ambulatory Visit (INDEPENDENT_AMBULATORY_CARE_PROVIDER_SITE_OTHER): Payer: Medicare Other | Admitting: Internal Medicine

## 2013-01-31 ENCOUNTER — Encounter: Payer: Self-pay | Admitting: Internal Medicine

## 2013-01-31 VITALS — BP 126/74 | HR 90 | Temp 97.0°F | Ht 64.25 in | Wt 237.0 lb

## 2013-01-31 DIAGNOSIS — J45909 Unspecified asthma, uncomplicated: Secondary | ICD-10-CM

## 2013-01-31 DIAGNOSIS — R0902 Hypoxemia: Secondary | ICD-10-CM

## 2013-01-31 DIAGNOSIS — R918 Other nonspecific abnormal finding of lung field: Secondary | ICD-10-CM

## 2013-01-31 DIAGNOSIS — R05 Cough: Secondary | ICD-10-CM

## 2013-01-31 LAB — PULMONARY FUNCTION TEST

## 2013-01-31 NOTE — Patient Instructions (Addendum)
No change in your medications  Weight control is simply a matter of calorie balance which needs to be tilted in your favor by eating less and exercising more.  To get the most out of exercise, you need to be continuously aware that you are short of breath, but never out of breath, for 30 minutes daily. As you improve, it will actually be easier for you to do the same amount of exercise  in  30 minutes so always push to the level where you are short of breath.  Follow up in November after your CT chest in the first week in November  Recheck ono 2lpm and leave on 02 at hs until wt loss

## 2013-01-31 NOTE — Progress Notes (Signed)
Subjective:    Patient ID: Kelli Velasquez, female    DOB: Aug 21, 1940, 72 y.o.   MRN: [id number]  Brief patient profile:  71 yowf quit smoking in 1984  with h/o CAD, HTN, OA and obesity presents for an initial pulmonary consult for cough x 6 weeks and recurrent bronchitis flares x 1 year ~10/2011 referred by [facility]  PCP : Dr.  Milinda Antis   11/24/2012 Initial pulmonary consult  Complains of chronic cough  For 4-6 weeks .  Referred by Dr Milinda Antis  Reports coughing spells for approx 1 month, began 1 years ago.  This one is the longest w/ occ white mucus prod, occ wheezing.   Denies SOB, head congestion, watery eyes, f/c/s Has been treated with abx without any help.  CXR on 4/3 showed no acute process.  Given cough syrup with tussionex -some help.  Metoprolol was stopped ? Asthma flare d/t nonselective BB effect.  Did not see any improvement in cough, but energy level better off BB.  Cough is day and night, rattling cough but minimally productive. Keeps her up at night.  No fever, night sweats, hemoptysis, wt loss.   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  Started on Prilosec Twice daily  And Zantac At bedtime   Denies significant dyspnea, however can not walk long distances , [place] in [place]  Does water aerobics - on reg basis. Unable to go lately due to cough .   Denies orthopnea but sleeping in recliner last few weeks due to cough and wheezing in supine position.  No calf pain , chest pain or hemoptysis No increased leg swelling but has ankle edema chronically  Does have snoring , no sign daytime sleepiness.   5/1  Office walk showed desats 86% on RA .  5/1  spirometry FEV1 nml-73% , ratio 80  rec Late add -instructions emailed to pt  Prednisone taper.  Stop all fish/flax seed oil capsules.  Begin Delsym 2 tsp Twice daily  For cough  Begin Tessalon Three times a day  For cough  May use Hydromet 1 tsp every 6hr  as needed for cough. -may make you sleepy.  Begin Chlortrimeton 4mg  1 in am and 2 At bedtime   Continue on Prilosec 20mg  Twice daily   Continue on Zantac At bedtime     12/16/2012 f/u ov/Kier Smead  Chief Complaint  Patient presents with  . Follow-up    Pt states that her cough is much improved since the last visit.   still feels the tickle in throat present x one year Takes chlortrimeton 4mg  1 every am and 2 at hs but most of her symptoms are daytime No need for saba at this point rec Self monitor blood pressure - target is less than 140 over 85 Change chlortrimeton to 4mg   Up to every 4 hours as needed  Stay on acid suppression GERD   01/31/2013 f/u ov/Rollins Wrightson re cough and noct desat Chief Complaint  Patient presents with  . Followup with PFT    Cough continues to improve, but seems to flare up with humid weather  not needing saba at all, still some doe x exertion, not with adls   No obvious daytime variabilty or assoc sob or cp or chest tightness, subjective wheeze overt sinus or hb symptoms. No unusual exp hx or h/o childhood pna/ asthma or premature birth to her knowledge.   Sleeping ok without nocturnal  or early am exacerbation  of respiratory  c/o's or need for noct saba. Also denies any obvious fluctuation of symptoms with weather or environmental changes or other aggravating or alleviating factors except as outlined above   Current Medications, Allergies, Past Medical History, Past Surgical History, Family History, and Social History were reviewed in [employer] [facility] record.  ROS  The following are not active complaints unless bolded sore throat, dysphagia, dental problems, itching, sneezing,  nasal congestion or excess/ purulent secretions, ear ache,   fever, chills, sweats, unintended wt loss, pleuritic or exertional cp, hemoptysis,  orthopnea pnd or leg swelling, presyncope, palpitations, heartburn, abdominal pain, anorexia, nausea, vomiting, diarrhea  or change  in bowel or urinary habits, change in stools or urine, dysuria,hematuria,  rash, arthralgias, visual complaints, headache, numbness weakness or ataxia or problems with walking or coordination,  change in mood/affect or memory.      PMH:  -Hx of NSTEMI.07/2009 w/  cardiac cath s/p 2 stents in LAD/ RCA       >>Echo at that time showed Mild LVH, EF of 55% and grade 1 diastolic dysfuntion  -Hyperlipidemia -HTN  -OA -s/p bilat. Knee replacements - Obesity    SH:  Retired from [employer] work Former smoker -quit 1984 -smoked x 20 1 PPD  Married  No pets  No unusual hobbies  Travels on vacation frequently (1-10/2012 -traveled to [place], [place] and [place] )    FH :  Stroke         Objective:   Physical Exam GEN: A/Ox3; pleasant , NAD, obese WF with  nl vital signs  01/31/2013         237  Wt Readings from Last 3 Encounters:  12/16/12 246 lb (111.585 kg)  11/24/12 252 lb 9.6 oz (114.579 kg)  11/10/12 245 lb 8 oz (111.358 kg)     HEENT:  [place]/AT,  EACs-clear, TMs-wnl, NOSE-clear, THROAT-clear, no lesions, no postnasal drip or exudate noted. pseudowheeze resolved  Edentulous   NECK:  Supple w/ fair ROM; no JVD; normal carotid impulses w/o bruits; no thyromegaly or nodules palpated; no lymphadenopathy.  RESP  Clear  P & A; w/o, wheezes/ rales/ or rhonchi.no accessory muscle use, no dullness to percussion  CARD:  RRR, no m/r/g  , 1+ peripheral edema, pulses intact, no cyanosis or clubbing., neg homans sign   GI:   Soft & nt; nml bowel sounds; no organomegaly or masses detected.  Musco: Warm bil, no deformities or joint swelling noted.  Bilateral knee scars    CT 11/25/12 1. Negative for pulmonary embolus.  2. Scattered small pulmonary nodules are nonspecific. If the  patient is at high risk for bronchogenic carcinoma, follow-up chest  CT at 3-6 months is recommended.  3. Borderline enlarged AP window lymph node is nonspecific.  4. Coronary artery calcification.  5. Question hepatic  steatosis.         Assessment & Plan:

## 2013-01-31 NOTE — Progress Notes (Signed)
PFT done today. 

## 2013-02-02 DIAGNOSIS — R918 Other nonspecific abnormal finding of lung field: Secondary | ICD-10-CM | POA: Insufficient documentation

## 2013-02-02 NOTE — Assessment & Plan Note (Signed)
11/24/2012 Office walk showed desats 86% on RA  With  spirometry FEV1 nml-73% , ratio 80   11/25/2012 CTA Chest >>neg for PE/ ILD  12/07/12 ONO > Pos 5:  begin O2 2 l/m At bedtime  12/13/2012  > rec repeat study on 02 7/78/14  - 01/31/2013  Walked RA x 3 laps @ 185 ft each stopped due to  End of study, no desats   She is clearly improving with the major finding being obesity related > needs to work on this and until looses wt stay on noct 02

## 2013-02-02 NOTE — Assessment & Plan Note (Addendum)
-   See CT 11/25/12 with MPN  She is > 15 y from smoking so still some risk but low  Discussed in detail all the  indications, usual  risks and alternatives  relative to the benefits with patient who agrees to proceed with f/u ct at 6 m

## 2013-02-02 NOTE — Assessment & Plan Note (Signed)
Resolved with treatment of upper airway cough syndrome    Each maintenance medication was reviewed in detail including most importantly the difference between maintenance and as needed and under what circumstances the prns are to be used.  Please see instructions for details which were reviewed in writing and the patient given a copy.

## 2013-02-06 ENCOUNTER — Ambulatory Visit (INDEPENDENT_AMBULATORY_CARE_PROVIDER_SITE_OTHER): Payer: Medicare Other | Admitting: Cardiovascular Disease

## 2013-02-06 ENCOUNTER — Encounter: Payer: Self-pay | Admitting: Cardiovascular Disease

## 2013-02-06 VITALS — BP 122/72 | HR 97 | Ht 64.25 in | Wt 237.0 lb

## 2013-02-06 DIAGNOSIS — I2510 Atherosclerotic heart disease of native coronary artery without angina pectoris: Secondary | ICD-10-CM

## 2013-02-06 NOTE — Progress Notes (Signed)
History of Present Illness: 72 yo female with h/o CAD, HTN, OA and obesity here today for cardiac follow up. She was admitted to [facility] from 07/27/09 to 07/31/09 with a NSTEMI. She had a cardiac cath on 07/29/09 showing severe two vessel disease and we placed Promus drug eluting stents in the LAD and the RCA. She has had bronchitis this winter with cough and has seen Pulmonary. Beta blocker stopped. She is feeling better. She has had a sleep study and was told oxygen level was dropping at night. She is on O2 at night. Last sleep study pending.   She is here today for planned annual cardiac follow up. She has had no chest pain or SOB. She has been doing well. She is exercising daily with water aerobics.   Primary Care Physician: Dolores Lory Tower.   Last Lipid Profile:Lipid Panel     Component Value Date/Time   CHOL 173 09/15/2012 0830   TRIG 185.0* 09/15/2012 0830   HDL 41.70 09/15/2012 0830   CHOLHDL 4 09/15/2012 0830   VLDL 37.0 09/15/2012 0830   LDLCALC 94 09/15/2012 0830     Past Medical History  Diagnosis Date  . Myocardial infarction     subendocardial, initial episode  . Hyperlipidemia   . Hypertension   . Obesity   . Neoplasm of skin     neoplasm of uncertain behavior of skin  . Vertigo   . Myopia   . Overactive bladder   . Urinary incontinence   . Osteoarthritis   . Asthma   . Allergy     allergic rhinitis  . Rash     and other non specific skin eruptions  . Gallstones   . Coronary artery disease     cath January 2011 with DEs LAD and RCA    Past Surgical History  Procedure Laterality Date  . Cholecystectomy    . Joint replacement      right total knee replacement  . Coronary angioplasty with stent placement  07/2009  . Total knee arthroplasty      left , then vocal cord infection post op  . Vocal cord polypectomy      Current Outpatient Prescriptions  Medication Sig Dispense Refill  . acetaminophen (TYLENOL) 500 MG tablet Take 1,000 mg by mouth  as needed.       Marland Kitchen albuterol (PROVENTIL HFA;VENTOLIN HFA) 108 (90 BASE) MCG/ACT inhaler Inhale 2 puffs into the lungs every 4 (four) hours as needed for wheezing or shortness of breath.  1 Inhaler  5  . amLODipine (NORVASC) 10 MG tablet TAKE 1 TABLET EVERY DAY  30 tablet  5  . Ascorbic Acid (VITAMIN C) 1000 MG tablet Take 1,000 mg by mouth daily.        Marland Kitchen aspirin 81 MG tablet Take 81 mg by mouth daily.        . calcium-vitamin D (OSCAL WITH D 500-200) 500-200 MG-UNIT per tablet Take 1 tablet by mouth daily.        . clopidogrel (PLAVIX) 75 MG tablet Take 1 tablet (75 mg total) by mouth daily.  30 tablet  11  . cromolyn (NASALCROM) 5.2 MG/ACT nasal spray Place 2 sprays into the nose daily.       Marland Kitchen EPINEPHrine (EPI-PEN) 0.3 mg/0.3 mL DEVI Inject 0.3 mLs (0.3 mg total) into the muscle once. As needed for allergic reaction  1 Device  3  . ezetimibe (ZETIA) 10 MG tablet Take 1 tablet (10 mg total) by  mouth daily.  30 tablet  11  . Flaxseed-Eve Prim-Bilberry (TEARS AGAIN HYDRATE) 1000-500-40 MG CAPS PLACE 1 DROP BOTH EYES DAILY      . furosemide (LASIX) 20 MG tablet TAKE 1 TABLET BY MOUTH EVERY DAY AS NEEDED  30 tablet  0  . nitroGLYCERIN (NITROSTAT) 0.4 MG SL tablet Take sublingual as directed as needed.       Marland Kitchen omeprazole (PRILOSEC OTC) 20 MG tablet Take 20 mg by mouth 2 (two) times daily before a meal.       . ranitidine (ZANTAC) 150 MG tablet 2 tabs by mouth at bedtime       No current facility-administered medications for this visit.    Allergies  Allergen Reactions  . Shellfish Allergy Anaphylaxis  . Aspirin     REACTION: nausea and vomiting  . Atorvastatin     REACTION: leg pain  . Crestor (Rosuvastatin Calcium) Other (See Comments)    Muscle pain - allergy/intolerance  . Fish Allergy   . Fluticasone Propionate     REACTION: not effective  . Simvastatin     REACTION: muscle pain  . Trandolapril     REACTION: leg pain    History   Social History  . Marital Status: Married     Spouse Name: N/A    Number of Children: N/A  . Years of Education: N/A   Occupational History  . Not on file.   Social History Main Topics  . Smoking status: Former Smoker -- 1.00 packs/day for 25 years    Types: Cigarettes    Quit date: 07/27/1982  . Smokeless tobacco: Never Used  . Alcohol Use: Yes     Comment: wine occasional  . Drug Use: No  . Sexually Active: Not on file   Other Topics Concern  . Not on file   Social History Narrative  . No narrative on file    Family History  Problem Relation Age of Onset  . Stroke Mother   . Stroke Father   . Colon cancer Neg Hx   . Breast cancer Maternal Grandmother   . Diabetes Mother 16    Review of Systems:  As stated in the HPI and otherwise negative.   BP 122/72  Pulse 97  Ht 5' 4.25" (1.632 m)  Wt 237 lb (107.502 kg)  BMI 40.36 kg/m2  LMP 07/27/1980  Physical Examination: General: Well developed, well nourished, NAD HEENT: OP clear, mucus membranes moist SKIN: warm, dry. No rashes. Neuro: No focal deficits Musculoskeletal: Muscle strength 5/5 all ext Psychiatric: Mood and affect normal Neck: No JVD, no carotid bruits, no thyromegaly, no lymphadenopathy. Lungs:Clear bilaterally, no wheezes, rhonci, crackles Cardiovascular: Regular rate and rhythm. No murmurs, gallops or rubs. Abdomen:Soft. Bowel sounds present. Non-tender.  Extremities: No lower extremity edema. Pulses are 2 + in the bilateral DP/PT.  EKG: Sinus, rate 97 bpm. Poor R wave progression precordial leads.   Assessment and Plan:   1. CAD: Stable. Will continue ASA and and Zetia. She is intolerant to statins. Beta blocker stopped by pulmonary secondary to possible bronchospasm. Will stop Plavix since her stents were placed in 2011.      2.  HYPERTENSION:  BP well controlled. No changes.

## 2013-02-06 NOTE — Patient Instructions (Addendum)
Your physician wants you to follow-up in:  12 months.  You will receive a reminder letter in the mail two months in advance. If you don't receive a letter, please call our office to schedule the follow-up appointment.   

## 2013-02-07 ENCOUNTER — Encounter: Payer: Self-pay | Admitting: Internal Medicine

## 2013-02-08 ENCOUNTER — Telehealth: Payer: Self-pay | Admitting: Internal Medicine

## 2013-02-08 NOTE — Telephone Encounter (Signed)
Per ONO 2lpm was normal, needs to cont. o2 at [facility] with pt's spouse and notified of results per Dr. Sherene Sires. He verbalized understanding and denied any questions.

## 2013-02-09 ENCOUNTER — Other Ambulatory Visit: Payer: Self-pay | Admitting: Adult Health

## 2013-02-10 ENCOUNTER — Encounter: Payer: Self-pay | Admitting: Internal Medicine

## 2013-03-12 ENCOUNTER — Other Ambulatory Visit: Payer: Self-pay | Admitting: Adult Health

## 2013-03-23 ENCOUNTER — Telehealth: Payer: Self-pay | Admitting: Internal Medicine

## 2013-03-23 MED ORDER — FUROSEMIDE 20 MG PO TABS: ORAL_TABLET | ORAL | Status: DC

## 2013-03-23 NOTE — Telephone Encounter (Signed)
RX has been sent for pt on lasix. Nothing further needed

## 2013-03-31 ENCOUNTER — Other Ambulatory Visit: Payer: Self-pay | Admitting: Family Medicine

## 2013-03-31 NOTE — Telephone Encounter (Signed)
Please schedule f/u in late fall and refill until then, thanks

## 2013-03-31 NOTE — Telephone Encounter (Signed)
F/u scheduled and med refilled until then 

## 2013-03-31 NOTE — Telephone Encounter (Signed)
Electronic refill request, no recent/future appt., please advise  

## 2013-04-03 ENCOUNTER — Encounter: Payer: Self-pay | Admitting: Family Medicine

## 2013-04-03 ENCOUNTER — Ambulatory Visit (INDEPENDENT_AMBULATORY_CARE_PROVIDER_SITE_OTHER): Payer: Medicare Other | Admitting: Family Medicine

## 2013-04-03 VITALS — BP 128/78 | HR 91 | Temp 98.1°F | Ht 64.25 in | Wt 235.2 lb

## 2013-04-03 DIAGNOSIS — K432 Incisional hernia without obstruction or gangrene: Secondary | ICD-10-CM | POA: Insufficient documentation

## 2013-04-03 DIAGNOSIS — Z23 Encounter for immunization: Secondary | ICD-10-CM

## 2013-04-03 NOTE — Assessment & Plan Note (Signed)
intitial encounter for incisional abd hernia - just under umbilicus It is reducible today and nt However -did have pain several days ago  Disc s/s to watch for to get urgent attn in ER- pain/ enlargement / inability to reduce  Ref to gen surg for eval  Disc imp of avoiding straining or heavy lifting

## 2013-04-03 NOTE — Patient Instructions (Addendum)
I think you have an incisional hernia  Try to avoid straining and heavy lifting  If painful or larger - please update [place] and go to ER if necessary after hours  Stop up front for your referral to surgeon

## 2013-04-03 NOTE — Progress Notes (Signed)
Subjective:    Patient ID: Kelli Velasquez, female    DOB: [birth year], 72 y.o.   MRN: [id number]  HPI Here for abdominal problem She thinks she has a hernia - from old lap surg for [facility] Was painful for several days (better today)  Some protuberance of area- can push it back in   No other GI symptoms No n/v/d   Otherwise feeling well in general  No straining or heavy lifting  Cough is improved   Patient Active Problem List   Diagnosis Date Noted  . Pulmonary nodules 02/02/2013  . Cough 12/18/2012  . Hypoxemia 10/26/2012  . Muscle cramps 10/26/2012  . Special screening for malignant neoplasms, colon 06/16/2011  . Other screening mammogram 06/16/2011  . Preseptal cellulitis 06/05/2011  . DYSPHONIA 10/10/2010  . HYPERGLYCEMIA 05/27/2010  . CAD, NATIVE VESSEL 08/19/2009  . 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 OBESITY 09/01/2007  . MYOPIA 03/03/2007  . HYPERLIPIDEMIA 10/15/2006  . HYPERTENSION 10/15/2006  . ALLERGIC RHINITIS 10/15/2006  . ASTHMA 10/15/2006  . OVERACTIVE BLADDER 10/15/2006  . OSTEOARTHRITIS 10/15/2006  . URINARY INCONTINENCE 10/15/2006   Past Medical History  Diagnosis Date  . Myocardial infarction     subendocardial, initial episode  . Hyperlipidemia   . Hypertension   . Obesity   . Neoplasm of skin     neoplasm of uncertain behavior of skin  . Vertigo   . Myopia   . Overactive bladder   . Urinary incontinence   . Osteoarthritis   . Asthma   . Allergy     allergic rhinitis  . Rash     and other non specific skin eruptions  . Gallstones   . Coronary artery disease     cath January 2011 with DEs LAD and RCA   Past Surgical History  Procedure Laterality Date  . Cholecystectomy    . Joint replacement      right total knee replacement  . Coronary angioplasty with stent placement  07/2009  . Total knee arthroplasty      left , then vocal cord infection post op  . Vocal cord polypectomy     History  Substance  Use Topics  . Smoking status: Former Smoker -- 1.00 packs/day for 25 years    Types: Cigarettes    Quit date: 07/27/1982  . Smokeless tobacco: Never Used  . Alcohol Use: Yes     Comment: wine occasional   Family History  Problem Relation Age of Onset  . Stroke Mother   . Stroke Father   . Colon cancer Neg Hx   . Breast cancer Maternal Grandmother   . Diabetes Mother 44   Allergies  Allergen Reactions  . Shellfish Allergy Anaphylaxis  . Aspirin     REACTION: nausea and vomiting  . Atorvastatin     REACTION: leg pain  . Crestor [Rosuvastatin Calcium] Other (See Comments)    Muscle pain - allergy/intolerance  . Fish Allergy   . Fluticasone Propionate     REACTION: not effective  . Simvastatin     REACTION: muscle pain  . Trandolapril     REACTION: leg pain   Current Outpatient Prescriptions on File Prior to Visit  Medication Sig Dispense Refill  . acetaminophen (TYLENOL) 500 MG tablet Take 1,000 mg by mouth as needed.       Marland Kitchen albuterol (PROVENTIL HFA;VENTOLIN HFA) 108 (90 BASE) MCG/ACT inhaler Inhale 2 puffs into the lungs every 4 (four) hours as needed for wheezing or shortness of  breath.  1 Inhaler  5  . amLODipine (NORVASC) 10 MG tablet TAKE 1 TABLET BY MOUTH EVERY DAY  30 tablet  2  . Ascorbic Acid (VITAMIN C) 1000 MG tablet Take 1,000 mg by mouth daily.        Marland Kitchen aspirin 81 MG tablet Take 81 mg by mouth daily.        . calcium-vitamin D (OSCAL WITH D 500-200) 500-200 MG-UNIT per tablet Take 1 tablet by mouth daily.        . cromolyn (NASALCROM) 5.2 MG/ACT nasal spray Place 2 sprays into the nose daily.       Marland Kitchen EPINEPHrine (EPI-PEN) 0.3 mg/0.3 mL DEVI Inject 0.3 mLs (0.3 mg total) into the muscle once. As needed for allergic reaction  1 Device  3  . ezetimibe (ZETIA) 10 MG tablet Take 1 tablet (10 mg total) by mouth daily.  30 tablet  11  . furosemide (LASIX) 20 MG tablet TAKE 1 TABLET BY MOUTH EVERY DAY AS NEEDED  30 tablet  0  . nitroGLYCERIN (NITROSTAT) 0.4 MG SL  tablet Take sublingual as directed as needed.       Marland Kitchen omeprazole (PRILOSEC OTC) 20 MG tablet Take 20 mg by mouth 2 (two) times daily before a meal.       . ranitidine (ZANTAC) 150 MG tablet 2 tabs by mouth at bedtime       No current facility-administered medications on file prior to visit.    Review of Systems Review of Systems  Constitutional: Negative for fever, appetite change, fatigue and unexpected weight change.  Eyes: Negative for pain and visual disturbance.  Respiratory: Negative for cough and shortness of breath.   Cardiovascular: Negative for cp or palpitations    Gastrointestinal: Negative for nausea, diarrhea and constipation. neg for abd pain today Genitourinary: Negative for urgency and frequency.  Skin: Negative for pallor or rash   Neurological: Negative for weakness, light-headedness, numbness and headaches.  Hematological: Negative for adenopathy. Does not bruise/bleed easily.  Psychiatric/Behavioral: Negative for dysphoric mood. The patient is not nervous/anxious.         Objective:   Physical Exam  Constitutional: She appears well-developed and well-nourished. No distress.  obese and well appearing   HENT:  Head: Normocephalic and atraumatic.  Neck: Normal range of motion. Neck supple.  Cardiovascular: Normal rate and regular rhythm.   Pulmonary/Chest: Effort normal and breath sounds normal. No respiratory distress. She has no wheezes. She has no rales.  Abdominal: Soft. Bowel sounds are normal. She exhibits mass. She exhibits no distension. There is no tenderness. There is no rebound and no guarding.  Soft mass just under old umbilical incision- is reducible and nt     Lymphadenopathy:    She has no cervical adenopathy.  Neurological: She is alert.  Skin: Skin is warm and dry. No pallor.  Psychiatric: She has a normal mood and affect.          Assessment & Plan:

## 2013-04-10 ENCOUNTER — Encounter (INDEPENDENT_AMBULATORY_CARE_PROVIDER_SITE_OTHER): Payer: Self-pay | Admitting: Surgery

## 2013-04-10 ENCOUNTER — Ambulatory Visit (INDEPENDENT_AMBULATORY_CARE_PROVIDER_SITE_OTHER): Payer: Medicare Other | Admitting: Surgery

## 2013-04-10 VITALS — BP 144/72 | HR 51 | Temp 96.8°F | Ht 65.0 in | Wt 236.2 lb

## 2013-04-10 DIAGNOSIS — K432 Incisional hernia without obstruction or gangrene: Secondary | ICD-10-CM

## 2013-04-10 NOTE — Progress Notes (Signed)
Patient ID: Kelli Velasquez, female   DOB: 07-05-1941, 72 y.o.   MRN: [id number]  Chief Complaint  Patient presents with  . New Evaluation    eval hernia    HPI Kelli Velasquez is a 72 y.o. female.  Patient sent at request of Dr. Milinda Antis for incisional hernia. Patient has history of laparoscopic cholecystectomy 10 years ago. She has had a bulge just below her umbilicus for the last 8 years or so. It has become more painful. The pain comes and goes but was severe within the last month. No redness or drainage. No history of bowel obstruction. Pain is sharp in nature severe at times. No clear precipitating factor. HPI  Past Medical History  Diagnosis Date  . Myocardial infarction     subendocardial, initial episode  . Hyperlipidemia   . Hypertension   . Obesity   . Neoplasm of skin     neoplasm of uncertain behavior of skin  . Vertigo   . Myopia   . Overactive bladder   . Urinary incontinence   . Osteoarthritis   . Asthma   . Allergy     allergic rhinitis  . Rash     and other non specific skin eruptions  . Gallstones   . Coronary artery disease     cath January 2011 with DEs LAD and RCA  . GERD (gastroesophageal reflux disease)     Past Surgical History  Procedure Laterality Date  . Cholecystectomy    . Joint replacement      right total knee replacement  . Coronary angioplasty with stent placement  07/2009  . Total knee arthroplasty      left , then vocal cord infection post op  . Vocal cord polypectomy      Family History  Problem Relation Age of Onset  . Stroke Mother   . Stroke Father   . Colon cancer Neg Hx   . Breast cancer Maternal Grandmother   . Diabetes Mother 64    Social History History  Substance Use Topics  . Smoking status: Former Smoker -- 1.00 packs/day for 25 years    Types: Cigarettes    Quit date: 07/27/1982  . Smokeless tobacco: Never Used  . Alcohol Use: Yes     Comment: wine occasional    Allergies  Allergen Reactions  . Shellfish Allergy  Anaphylaxis  . Aspirin     REACTION: nausea and vomiting  . Atorvastatin     REACTION: leg pain  . Crestor [Rosuvastatin Calcium] Other (See Comments)    Muscle pain - allergy/intolerance  . Fish Allergy   . Fluticasone Propionate     REACTION: not effective  . Simvastatin     REACTION: muscle pain  . Trandolapril     REACTION: leg pain    Current Outpatient Prescriptions  Medication Sig Dispense Refill  . acetaminophen (TYLENOL) 500 MG tablet Take 1,000 mg by mouth as needed.       Marland Kitchen albuterol (PROVENTIL HFA;VENTOLIN HFA) 108 (90 BASE) MCG/ACT inhaler Inhale 2 puffs into the lungs every 4 (four) hours as needed for wheezing or shortness of breath.  1 Inhaler  5  . amLODipine (NORVASC) 10 MG tablet TAKE 1 TABLET BY MOUTH EVERY DAY  30 tablet  2  . Ascorbic Acid (VITAMIN C) 1000 MG tablet Take 1,000 mg by mouth daily.        Marland Kitchen aspirin 81 MG tablet Take 81 mg by mouth daily.        Marland Kitchen  calcium-vitamin D (OSCAL WITH D 500-200) 500-200 MG-UNIT per tablet Take 1 tablet by mouth daily.        . cromolyn (NASALCROM) 5.2 MG/ACT nasal spray Place 2 sprays into the nose daily.       Marland Kitchen EPINEPHrine (EPI-PEN) 0.3 mg/0.3 mL DEVI Inject 0.3 mLs (0.3 mg total) into the muscle once. As needed for allergic reaction  1 Device  3  . ezetimibe (ZETIA) 10 MG tablet Take 1 tablet (10 mg total) by mouth daily.  30 tablet  11  . furosemide (LASIX) 20 MG tablet TAKE 1 TABLET BY MOUTH EVERY DAY AS NEEDED  30 tablet  0  . nitroGLYCERIN (NITROSTAT) 0.4 MG SL tablet Take sublingual as directed as needed.       Marland Kitchen omeprazole (PRILOSEC OTC) 20 MG tablet Take 20 mg by mouth 2 (two) times daily before a meal.       . ranitidine (ZANTAC) 150 MG tablet 2 tabs by mouth at bedtime       No current facility-administered medications for this visit.    Review of Systems Review of Systems  Constitutional: Negative.   HENT: Negative.   Eyes: Negative.   Respiratory: Positive for cough.   Cardiovascular: Negative.     Gastrointestinal: Positive for abdominal pain.  Endocrine: Negative.   Genitourinary: Negative.   Musculoskeletal: Negative.   Skin: Negative.   Allergic/Immunologic: Negative.   Neurological: Negative.   Hematological: Negative.   Psychiatric/Behavioral: Negative.     Blood pressure 144/72, pulse 51, temperature 96.8 F (36 C), temperature source Temporal, height 5\' 5"  (1.651 m), weight 236 lb 3.2 oz (107.14 kg), last menstrual period 07/27/1980, SpO2 95.00%.  Physical Exam Physical Exam  Constitutional: She is oriented to person, place, and time. She appears well-developed and well-nourished.  HENT:  Head: Normocephalic and atraumatic.  Eyes: EOM are normal. Pupils are equal, round, and reactive to light.  Neck: Normal range of motion. Neck supple.  Cardiovascular: Normal rate and regular rhythm.  Exam reveals no friction rub.   No murmur heard. Occasional  PVC  Pulmonary/Chest: Breath sounds normal. No respiratory distress. She has no wheezes.  Abdominal: Soft. There is no tenderness. There is no rigidity, no rebound and no guarding. A hernia is present.    Musculoskeletal: Normal range of motion.  Neurological: She is alert and oriented to person, place, and time.  Skin: Skin is warm and dry.  Psychiatric: She has a normal mood and affect. Her behavior is normal. Judgment and thought content normal.    Data Reviewed Dr Lucretia Roers notes.  Assessment    Infraumbilical incisional hernia from previous laparoscopic cholecystectomy 10 years ago    Plan    Recommend repair of infraumbilical incisional hernia with mesh.The risk of hernia repair include bleeding,  Infection,   Recurrence of the hernia,  Mesh use, chronic pain,  Organ injury,  Bowel injury,  Bladder injury,   nerve injury with numbness around the incision,  Death,  and worsening of preexisting  medical problems.  The alternatives to surgery have been discussed as well..  Long term expectations of both operative  and non operative treatments have been discussed.   The patient agrees to proceed.       Debroah Shuttleworth A. 04/10/2013, 11:00 AM

## 2013-04-10 NOTE — Patient Instructions (Addendum)
Hernia A hernia occurs when an internal organ pushes out through a weak spot in the abdominal wall. Hernias most commonly occur in the groin and around the navel. Hernias often can be pushed back into place (reduced). Most hernias tend to get worse over time. Some abdominal hernias can get stuck in the opening (irreducible or incarcerated hernia) and cannot be reduced. An irreducible abdominal hernia which is tightly squeezed into the opening is at risk for impaired blood supply (strangulated hernia). A strangulated hernia is a medical emergency. Because of the risk for an irreducible or strangulated hernia, surgery may be recommended to repair a hernia. CAUSES   Heavy lifting.  Prolonged coughing.  Straining to have a bowel movement.  A cut (incision) made during an abdominal surgery. HOME CARE INSTRUCTIONS   Bed rest is not required. You may continue your normal activities.  Avoid lifting more than 10 pounds (4.5 kg) or straining.  Cough gently. If you are a smoker it is best to stop. Even the best hernia repair can break down with the continual strain of coughing. Even if you do not have your hernia repaired, a cough will continue to aggravate the problem.  Do not wear anything tight over your hernia. Do not try to keep it in with an outside bandage or truss. These can damage abdominal contents if they are trapped within the hernia sac.  Eat a normal diet.  Avoid constipation. Straining over long periods of time will increase hernia size and encourage breakdown of repairs. If you cannot do this with diet alone, stool softeners may be used. SEEK IMMEDIATE MEDICAL CARE IF:   You have a fever.  You develop increasing abdominal pain.  You feel nauseous or vomit.  Your hernia is stuck outside the abdomen, looks discolored, feels hard, or is tender.  You have any changes in your bowel habits or in the hernia that are unusual for you.  You have increased pain or swelling around the  hernia.  You cannot push the hernia back in place by applying gentle pressure while lying down. MAKE SURE YOU:   Understand these instructions.  Will watch your condition.  Will get help right away if you are not doing well or get worse. Document Released: 07/13/2005 Document Revised: 10/05/2011 Document Reviewed: 03/01/2008 [employer] Patient Information 2014 [employer].  

## 2013-05-01 ENCOUNTER — Other Ambulatory Visit: Payer: Self-pay | Admitting: Adult Health

## 2013-05-03 NOTE — Telephone Encounter (Signed)
Per last furosemide rx request on 03/12/13:  Please defer future refill requests to PCP as medication is as needed for leg swelling. Thanks. I called [facility], spoke with [facility].  They will send furosemide rx to pt's PCP.   Nothing further needed at this time.

## 2013-05-04 ENCOUNTER — Encounter (HOSPITAL_BASED_OUTPATIENT_CLINIC_OR_DEPARTMENT_OTHER): Payer: Self-pay | Admitting: *Deleted

## 2013-05-04 ENCOUNTER — Encounter (HOSPITAL_BASED_OUTPATIENT_CLINIC_OR_DEPARTMENT_OTHER)
Admission: RE | Admit: 2013-05-04 | Discharge: 2013-05-04 | Disposition: A | Discharge status: Home / Self Care | Payer: Medicare Other | Source: Ambulatory Visit | Attending: Surgery | Admitting: Surgery

## 2013-05-04 ENCOUNTER — Other Ambulatory Visit: Payer: Self-pay | Admitting: *Deleted

## 2013-05-04 DIAGNOSIS — Z01812 Encounter for preprocedural laboratory examination: Secondary | ICD-10-CM | POA: Insufficient documentation

## 2013-05-04 LAB — CBC WITH DIFFERENTIAL/PLATELET
Basophils Absolute: 0 10*3/uL (ref 0.0–0.1)
Basophils Relative: 0 % (ref 0–1)
Eosinophils Absolute: 0.3 10*3/uL (ref 0.0–0.7)
Eosinophils Relative: 3 % (ref 0–5)
HCT: 41.2 % (ref 36.0–46.0)
Hemoglobin: 13.8 g/dL (ref 12.0–15.0)
Lymphocytes Relative: 20 % (ref 12–46)
Lymphs Abs: 2.2 10*3/uL (ref 0.7–4.0)
MCH: 31.3 pg (ref 26.0–34.0)
MCHC: 33.5 g/dL (ref 30.0–36.0)
MCV: 93.4 fL (ref 78.0–100.0)
Monocytes Absolute: 0.8 10*3/uL (ref 0.1–1.0)
Monocytes Relative: 7 % (ref 3–12)
Neutro Abs: 7.5 10*3/uL (ref 1.7–7.7)
Neutrophils Relative %: 69 % (ref 43–77)
Platelets: 169 10*3/uL (ref 150–400)
RBC: 4.41 MIL/uL (ref 3.87–5.11)
RDW: 15.3 % (ref 11.5–15.5)
WBC: 10.8 10*3/uL — ABNORMAL HIGH (ref 4.0–10.5)

## 2013-05-04 MED ORDER — FUROSEMIDE 20 MG PO TABS: ORAL_TABLET | ORAL | Status: DC

## 2013-05-04 NOTE — Progress Notes (Signed)
Pt here for labs-will bring all meds and overnight bag in case she has any resp problems-uses o2 at night-no cpap

## 2013-05-16 ENCOUNTER — Encounter (HOSPITAL_BASED_OUTPATIENT_CLINIC_OR_DEPARTMENT_OTHER): Payer: Medicare Other | Admitting: Anesthesiology

## 2013-05-16 ENCOUNTER — Encounter (HOSPITAL_BASED_OUTPATIENT_CLINIC_OR_DEPARTMENT_OTHER)
Admission: RE | Disposition: A | Discharge status: Home / Self Care | Payer: Self-pay | Source: Ambulatory Visit | Attending: Surgery

## 2013-05-16 ENCOUNTER — Ambulatory Visit (HOSPITAL_BASED_OUTPATIENT_CLINIC_OR_DEPARTMENT_OTHER)
Admission: RE | Admit: 2013-05-16 | Discharge: 2013-05-16 | Disposition: A | Discharge status: Home / Self Care | Payer: Medicare Other | Source: Ambulatory Visit | Attending: Surgery | Admitting: Surgery

## 2013-05-16 ENCOUNTER — Ambulatory Visit (HOSPITAL_BASED_OUTPATIENT_CLINIC_OR_DEPARTMENT_OTHER): Payer: Medicare Other | Admitting: Anesthesiology

## 2013-05-16 ENCOUNTER — Encounter (HOSPITAL_BASED_OUTPATIENT_CLINIC_OR_DEPARTMENT_OTHER): Payer: Self-pay | Admitting: *Deleted

## 2013-05-16 DIAGNOSIS — M1990 Unspecified osteoarthritis, unspecified site: Secondary | ICD-10-CM | POA: Insufficient documentation

## 2013-05-16 DIAGNOSIS — K430 Incisional hernia with obstruction, without gangrene: Secondary | ICD-10-CM

## 2013-05-16 DIAGNOSIS — K432 Incisional hernia without obstruction or gangrene: Secondary | ICD-10-CM | POA: Insufficient documentation

## 2013-05-16 DIAGNOSIS — E669 Obesity, unspecified: Secondary | ICD-10-CM | POA: Insufficient documentation

## 2013-05-16 DIAGNOSIS — Z87891 Personal history of nicotine dependence: Secondary | ICD-10-CM | POA: Insufficient documentation

## 2013-05-16 DIAGNOSIS — R32 Unspecified urinary incontinence: Secondary | ICD-10-CM

## 2013-05-16 DIAGNOSIS — I10 Essential (primary) hypertension: Secondary | ICD-10-CM

## 2013-05-16 DIAGNOSIS — J309 Allergic rhinitis, unspecified: Secondary | ICD-10-CM | POA: Insufficient documentation

## 2013-05-16 DIAGNOSIS — Z01812 Encounter for preprocedural laboratory examination: Secondary | ICD-10-CM | POA: Insufficient documentation

## 2013-05-16 DIAGNOSIS — J45909 Unspecified asthma, uncomplicated: Secondary | ICD-10-CM | POA: Insufficient documentation

## 2013-05-16 DIAGNOSIS — H5210 Myopia, unspecified eye: Secondary | ICD-10-CM

## 2013-05-16 DIAGNOSIS — Z1231 Encounter for screening mammogram for malignant neoplasm of breast: Secondary | ICD-10-CM

## 2013-05-16 DIAGNOSIS — R3981 Functional urinary incontinence: Secondary | ICD-10-CM | POA: Insufficient documentation

## 2013-05-16 DIAGNOSIS — R0902 Hypoxemia: Secondary | ICD-10-CM

## 2013-05-16 DIAGNOSIS — I2510 Atherosclerotic heart disease of native coronary artery without angina pectoris: Secondary | ICD-10-CM | POA: Insufficient documentation

## 2013-05-16 DIAGNOSIS — I252 Old myocardial infarction: Secondary | ICD-10-CM | POA: Insufficient documentation

## 2013-05-16 DIAGNOSIS — H00039 Abscess of eyelid unspecified eye, unspecified eyelid: Secondary | ICD-10-CM

## 2013-05-16 DIAGNOSIS — I214 Non-ST elevation (NSTEMI) myocardial infarction: Secondary | ICD-10-CM

## 2013-05-16 DIAGNOSIS — N318 Other neuromuscular dysfunction of bladder: Secondary | ICD-10-CM | POA: Insufficient documentation

## 2013-05-16 DIAGNOSIS — R7309 Other abnormal glucose: Secondary | ICD-10-CM

## 2013-05-16 DIAGNOSIS — R918 Other nonspecific abnormal finding of lung field: Secondary | ICD-10-CM | POA: Insufficient documentation

## 2013-05-16 DIAGNOSIS — R490 Dysphonia: Secondary | ICD-10-CM

## 2013-05-16 DIAGNOSIS — R252 Cramp and spasm: Secondary | ICD-10-CM

## 2013-05-16 DIAGNOSIS — R05 Cough: Secondary | ICD-10-CM

## 2013-05-16 DIAGNOSIS — Z1211 Encounter for screening for malignant neoplasm of colon: Secondary | ICD-10-CM

## 2013-05-16 DIAGNOSIS — E785 Hyperlipidemia, unspecified: Secondary | ICD-10-CM

## 2013-05-16 HISTORY — DX: Idiopathic sleep related nonobstructive alveolar hypoventilation: G47.34

## 2013-05-16 HISTORY — DX: Presence of spectacles and contact lenses: Z97.3

## 2013-05-16 HISTORY — DX: Presence of dental prosthetic device (complete) (partial): Z97.2

## 2013-05-16 HISTORY — DX: Complete loss of teeth, unspecified cause, unspecified class: K08.109

## 2013-05-16 HISTORY — PX: INCISIONAL HERNIA REPAIR: SHX193

## 2013-05-16 HISTORY — PX: INSERTION OF MESH: SHX5868

## 2013-05-16 LAB — POCT HEMOGLOBIN-HEMACUE: Hemoglobin: 12.2 g/dL (ref 12.0–15.0)

## 2013-05-16 SURGERY — REPAIR, HERNIA, INCISIONAL
Anesthesia: General | Site: Abdomen | Wound class: Clean

## 2013-05-16 MED ORDER — PROPOFOL 10 MG/ML IV BOLUS
INTRAVENOUS | Status: DC | PRN
  Administered 2013-05-16: 200 mg via INTRAVENOUS

## 2013-05-16 MED ORDER — ROCURONIUM BROMIDE 100 MG/10ML IV SOLN
INTRAVENOUS | Status: DC | PRN
  Administered 2013-05-16: 30 mg via INTRAVENOUS

## 2013-05-16 MED ORDER — LIDOCAINE HCL (CARDIAC) 20 MG/ML IV SOLN
INTRAVENOUS | Status: DC | PRN
  Administered 2013-05-16: 75 mg via INTRAVENOUS

## 2013-05-16 MED ORDER — MIDAZOLAM HCL 2 MG/2ML IJ SOLN: 1.0000 mg | INTRAMUSCULAR | Status: DC | PRN

## 2013-05-16 MED ORDER — GLYCOPYRROLATE 0.2 MG/ML IJ SOLN
INTRAMUSCULAR | Status: DC | PRN
  Administered 2013-05-16: 0.4 mg via INTRAVENOUS

## 2013-05-16 MED ORDER — DEXAMETHASONE SODIUM PHOSPHATE 4 MG/ML IJ SOLN
INTRAMUSCULAR | Status: DC | PRN
  Administered 2013-05-16: 10 mg via INTRAVENOUS

## 2013-05-16 MED ORDER — DEXTROSE 5 % IV SOLN
3.0000 g | INTRAVENOUS | Status: AC
  Administered 2013-05-16: 2 g via INTRAVENOUS

## 2013-05-16 MED ORDER — ONDANSETRON HCL 4 MG/2ML IJ SOLN
INTRAMUSCULAR | Status: DC | PRN
  Administered 2013-05-16: 4 mg via INTRAVENOUS

## 2013-05-16 MED ORDER — FENTANYL CITRATE 0.05 MG/ML IJ SOLN: 50.0000 ug | INTRAMUSCULAR | Status: DC | PRN

## 2013-05-16 MED ORDER — ACETAMINOPHEN 325 MG PO TABS
650.0000 mg | ORAL_TABLET | Freq: Four times a day (QID) | ORAL | Status: DC | PRN
  Administered 2013-05-16: 650 mg via ORAL

## 2013-05-16 MED ORDER — BUPIVACAINE-EPINEPHRINE 0.25% -1:200000 IJ SOLN
INTRAMUSCULAR | Status: DC | PRN
  Administered 2013-05-16: 20 mL

## 2013-05-16 MED ORDER — FENTANYL CITRATE 0.05 MG/ML IJ SOLN
INTRAMUSCULAR | Status: DC | PRN
  Administered 2013-05-16 (×2): 50 ug via INTRAVENOUS

## 2013-05-16 MED ORDER — PROPOFOL 10 MG/ML IV EMUL
INTRAVENOUS | Status: AC
  Filled 2013-05-16: qty 50

## 2013-05-16 MED ORDER — HYDROMORPHONE HCL PF 1 MG/ML IJ SOLN: 0.2500 mg | INTRAMUSCULAR | Status: DC | PRN

## 2013-05-16 MED ORDER — MIDAZOLAM HCL 5 MG/5ML IJ SOLN
INTRAMUSCULAR | Status: DC | PRN
  Administered 2013-05-16: 1 mg via INTRAVENOUS

## 2013-05-16 MED ORDER — NEOSTIGMINE METHYLSULFATE 1 MG/ML IJ SOLN
INTRAMUSCULAR | Status: DC | PRN
  Administered 2013-05-16: 3 mg via INTRAVENOUS

## 2013-05-16 MED ORDER — SUCCINYLCHOLINE CHLORIDE 20 MG/ML IJ SOLN
INTRAMUSCULAR | Status: AC
  Filled 2013-05-16: qty 1

## 2013-05-16 MED ORDER — CEFAZOLIN SODIUM-DEXTROSE 2-3 GM-% IV SOLR
INTRAVENOUS | Status: AC
  Filled 2013-05-16: qty 50

## 2013-05-16 MED ORDER — ACETAMINOPHEN 325 MG PO TABS
ORAL_TABLET | ORAL | Status: AC
  Filled 2013-05-16: qty 2

## 2013-05-16 MED ORDER — FENTANYL CITRATE 0.05 MG/ML IJ SOLN
INTRAMUSCULAR | Status: AC
  Filled 2013-05-16: qty 4

## 2013-05-16 MED ORDER — TRAMADOL HCL 50 MG PO TABS: 50.0000 mg | ORAL_TABLET | Freq: Four times a day (QID) | ORAL | Status: DC | PRN

## 2013-05-16 MED ORDER — OXYCODONE-ACETAMINOPHEN 5-325 MG PO TABS: 1.0000 | ORAL_TABLET | ORAL | Status: DC | PRN

## 2013-05-16 MED ORDER — MIDAZOLAM HCL 2 MG/2ML IJ SOLN
INTRAMUSCULAR | Status: AC
  Filled 2013-05-16: qty 2

## 2013-05-16 MED ORDER — CHLORHEXIDINE GLUCONATE 4 % EX LIQD: 1.0000 "application " | Freq: Once | CUTANEOUS | Status: DC

## 2013-05-16 MED ORDER — BUPIVACAINE-EPINEPHRINE PF 0.25-1:200000 % IJ SOLN
INTRAMUSCULAR | Status: AC
  Filled 2013-05-16: qty 30

## 2013-05-16 MED ORDER — SODIUM CHLORIDE 0.9 % IR SOLN
Status: DC | PRN
  Administered 2013-05-16: 1

## 2013-05-16 MED ORDER — LACTATED RINGERS IV SOLN
INTRAVENOUS | Status: DC
  Administered 2013-05-16: 07:00:00 via INTRAVENOUS

## 2013-05-16 SURGICAL SUPPLY — 53 items
ADH SKN CLS APL DERMABOND .7 (GAUZE/BANDAGES/DRESSINGS) ×1
APL SKNCLS STERI-STRIP NONHPOA (GAUZE/BANDAGES/DRESSINGS) ×1
BENZOIN TINCTURE PRP APPL 2/3 (GAUZE/BANDAGES/DRESSINGS) ×2 IMPLANT
BLADE SURG 10 STRL SS (BLADE) ×2 IMPLANT
BLADE SURG 15 STRL LF DISP TIS (BLADE) ×1 IMPLANT
BLADE SURG 15 STRL SS (BLADE) ×2
BLADE SURG ROTATE 9660 (MISCELLANEOUS) IMPLANT
CANISTER SUCT 1200ML W/VALVE (MISCELLANEOUS) ×2 IMPLANT
CHLORAPREP W/TINT 26ML (MISCELLANEOUS) ×2 IMPLANT
CLEANER CAUTERY TIP 5X5 PAD (MISCELLANEOUS) ×1 IMPLANT
COVER MAYO STAND STRL (DRAPES) ×2 IMPLANT
COVER TABLE BACK 60X90 (DRAPES) ×2 IMPLANT
DECANTER SPIKE VIAL GLASS SM (MISCELLANEOUS) ×2 IMPLANT
DERMABOND ADVANCED (GAUZE/BANDAGES/DRESSINGS) ×1
DERMABOND ADVANCED .7 DNX12 (GAUZE/BANDAGES/DRESSINGS) IMPLANT
DRAPE LAPAROTOMY TRNSV 102X78 (DRAPE) ×2 IMPLANT
DRAPE UTILITY XL STRL (DRAPES) ×2 IMPLANT
DRSG TEGADERM 4X4.75 (GAUZE/BANDAGES/DRESSINGS) ×2 IMPLANT
ELECT REM PT RETURN 9FT ADLT (ELECTROSURGICAL) ×2
ELECTRODE REM PT RTRN 9FT ADLT (ELECTROSURGICAL) ×1 IMPLANT
GLOVE BIOGEL PI IND STRL 7.0 (GLOVE) IMPLANT
GLOVE BIOGEL PI IND STRL 8 (GLOVE) ×1 IMPLANT
GLOVE BIOGEL PI INDICATOR 7.0 (GLOVE) ×2
GLOVE BIOGEL PI INDICATOR 8 (GLOVE) ×1
GLOVE ECLIPSE 6.5 STRL STRAW (GLOVE) ×1 IMPLANT
GLOVE ECLIPSE 8.0 STRL XLNG CF (GLOVE) ×2 IMPLANT
GOWN PREVENTION PLUS XLARGE (GOWN DISPOSABLE) ×2 IMPLANT
MESH VENTRALEX ST 1-7/10 CRC S (Mesh General) ×1 IMPLANT
NDL HYPO 25X1 1.5 SAFETY (NEEDLE) ×1 IMPLANT
NEEDLE HYPO 22GX1.5 SAFETY (NEEDLE) IMPLANT
NEEDLE HYPO 25X1 1.5 SAFETY (NEEDLE) ×2 IMPLANT
NS IRRIG 1000ML POUR BTL (IV SOLUTION) IMPLANT
PACK BASIN DAY SURGERY FS (CUSTOM PROCEDURE TRAY) ×2 IMPLANT
PAD CLEANER CAUTERY TIP 5X5 (MISCELLANEOUS) ×1
PENCIL BUTTON HOLSTER BLD 10FT (ELECTRODE) ×2 IMPLANT
SLEEVE SCD COMPRESS KNEE MED (MISCELLANEOUS) ×1 IMPLANT
STAPLER VISISTAT 35W (STAPLE) ×1 IMPLANT
STRIP CLOSURE SKIN 1/2X4 (GAUZE/BANDAGES/DRESSINGS) ×2 IMPLANT
SUT MON AB 4-0 PC3 18 (SUTURE) ×2 IMPLANT
SUT NOVA NAB DX-16 0-1 5-0 T12 (SUTURE) IMPLANT
SUT NOVA NAB GS-21 0 18 T12 DT (SUTURE) ×2 IMPLANT
SUT NOVA NAB GS-22 2 0 T19 (SUTURE) IMPLANT
SUT PROLENE 0 CT 1 30 (SUTURE) IMPLANT
SUT SILK 3 0 SH 30 (SUTURE) IMPLANT
SUT VIC AB 2-0 SH 27 (SUTURE) ×2
SUT VIC AB 2-0 SH 27XBRD (SUTURE) ×1 IMPLANT
SUT VIC AB 3-0 SH 27 (SUTURE)
SUT VIC AB 3-0 SH 27X BRD (SUTURE) IMPLANT
SYR CONTROL 10ML LL (SYRINGE) ×2 IMPLANT
TOWEL OR 17X24 6PK STRL BLUE (TOWEL DISPOSABLE) ×2 IMPLANT
TOWEL OR NON WOVEN STRL DISP B (DISPOSABLE) ×2 IMPLANT
TUBE CONNECTING 20X1/4 (TUBING) ×2 IMPLANT
YANKAUER SUCT BULB TIP NO VENT (SUCTIONS) ×2 IMPLANT

## 2013-05-16 NOTE — Interval H&P Note (Signed)
History and Physical Interval Note:  05/16/2013 7:06 AM  Kelli Velasquez  has presented today for surgery, with the diagnosis of hernia  The various methods of treatment have been discussed with the patient and family. After consideration of risks, benefits and other options for treatment, the patient has consented to  Procedure(s): HERNIA REPAIR INFRAUMILICAL INCISIONAL (N/A) INSERTION OF MESH (N/A) as a surgical intervention .  The patient's history has been reviewed, patient examined, no change in status, stable for surgery.  I have reviewed the patient's chart and labs.  Questions were answered to the patient's satisfaction.     Kelli Velasquez A.

## 2013-05-16 NOTE — Anesthesia Procedure Notes (Signed)
Procedure Name: Intubation Date/Time: 05/16/2013 7:33 AM Performed by: Zenia Resides D Pre-anesthesia Checklist: Patient identified, Emergency Drugs available, Suction available and Patient being monitored Patient Re-evaluated:Patient Re-evaluated prior to inductionOxygen Delivery Method: Circle System Utilized Preoxygenation: Pre-oxygenation with 100% oxygen Intubation Type: IV induction Ventilation: Mask ventilation without difficulty Laryngoscope Size: Mac and 3 Grade View: Grade I Tube type: Oral Tube size: 7.0 mm Number of attempts: 1 Airway Equipment and Method: stylet and oral airway Placement Confirmation: ETT inserted through vocal cords under direct vision,  positive ETCO2 and breath sounds checked- equal and bilateral Secured at: 23 cm Tube secured with: Tape Dental Injury: Teeth and Oropharynx as per pre-operative assessment

## 2013-05-16 NOTE — Op Note (Signed)
Preoperative diagnosis: Infraumbilical incisional hernia measuring 2 cm x 2 cm  Postoperative diagnosis: Same  Procedure: Repair of incisional hernia with 4.3 cm x 4.3 cm Bard patch  Surgeon: Harriette Bouillon M.D.  Anesthesia: Gen. Endotracheal anesthesia with 0.25% Sensorcaine local with epinephrine  EBL: 20 cc  Specimen: None  IV fluids: 1200 cc crystalloid  Drains: None  Indications for procedure: The patient is a 72 year old female with a previous history of laparoscopic cholecystectomy. She is developed an incisional hernia at her previous umbilical port site. We discussed options of repair. We discussed the use of mesh. We decided on open repair.The risk of hernia repair include bleeding,  Infection,   Recurrence of the hernia,  Mesh use, chronic pain,  Organ injury,  Bowel injury,  Bladder injury,   nerve injury with numbness around the incision,  Death,  and worsening of preexisting  medical problems.  The alternatives to surgery have been discussed as well..  Long term expectations of both operative and non operative treatments have been discussed.   The patient agrees to proceed.  Description of procedure: Patient met in holding area and questions answered. We discussed the procedure as well as postoperative expectations and limitations. Questions are answered. She's taken back to the operating room and placed supine. General anesthesia was initiated. Periumbilical region was prepped and draped in a sterile fashion. Timeout was done. She received 3 g of Ancef. 4 cm incision was made in the base of the umbilicus toward the pubis. Hernia sac was identified in the subcutaneous fat and dissected free. The defect measured 2 cm in maximal diameter. Omentum was incarcerated within the hernia but was viable and reduced into the abdominal cavity. The fascia was grasped with Coker clamps. A finger was used to dissect under the fascia for 3 cm in all directions. A Bard umbilical patch was used  measuring 4.3 cm. This was placed and he sought fascia position and pulled up snugly to the undersurface of the fascia. #1 Novafil was used to secure the mesh circumferentially. The strap was then cut. Fascia closed with #1 Novafil over the mesh. Hemostasis achieved. The tissues closed with 3-0 Vicryl. 4-0 Monocryl used to close the skin in a subcuticular fashion. Dermabond applied. All final counts sponge, needle and instruments found to be correct this portion of the case. The patient was awoke taken to recovery in satisfactory condition.

## 2013-05-16 NOTE — Anesthesia Preprocedure Evaluation (Signed)
Anesthesia Evaluation  Patient identified by MRN, date of birth, ID band Patient awake    Reviewed: Allergy & Precautions, H&P , NPO status   Airway Mallampati: II      Dental   Pulmonary asthma ,          Cardiovascular hypertension, + CAD and + Past MI     Neuro/Psych    GI/Hepatic Neg liver ROS, GERD-  ,  Endo/Other  negative endocrine ROS  Renal/GU negative Renal ROS     Musculoskeletal   Abdominal   Peds  Hematology   Anesthesia Other Findings   Reproductive/Obstetrics                           Anesthesia Physical Anesthesia Plan  ASA: III  Anesthesia Plan: General   Post-op Pain Management:    Induction: Intravenous  Airway Management Planned: Oral ETT  Additional Equipment:   Intra-op Plan:   Post-operative Plan:   Informed Consent:   Dental advisory given  Plan Discussed with: CRNA and Anesthesiologist  Anesthesia Plan Comments:         Anesthesia Quick Evaluation

## 2013-05-16 NOTE — H&P (Signed)
Patient Care Coordination NoteJessica Bonne [place], [place] Tue Dec 13, 2012 4:37 PM  APS   O2 2lpm at bedtime    Currently admitted as of 05/16/2013  Demographics Kelli Velasquez 72 year old female  Comm Pref:   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  Problem ListHospitalization ProblemNon-Hospital  HYPERLIPIDEMIA  OBESITY  MYOPIA  HYPERTENSION  MYOCARDIAL INFARCTION, SUBENDOCARDIAL, INITIAL EPISODE  CAD, NATIVE VESSEL  ALLERGIC RHINITIS  ASTHMA  OVERACTIVE BLADDER  OSTEOARTHRITIS  URINARY INCONTINENCE  HYPERGLYCEMIA  DYSPHONIA  Preseptal cellulitis  Special screening for malignant neoplasms, colon  Other screening mammogram  Hypoxemia  Muscle cramps  Cough  Pulmonary nodules  Hernia, incisional  Significant History/Details  Smoking: Former Smoker (Quit Date:07/27/1982), 1 ppd, 25 pack-years  Smokeless Tobacco: Never Used  Alcohol: Yes  Comments: Designated party signed on 07/09/10 appointing Milon Dikes .JRR  6 open orders  Preferred Language: English   Date of Birth07-30-1942AllergiesMark as: Review Complete   SHELLFISH ALLERGY  Anaphylaxis   ASPIRIN     ATORVASTATIN     CRESTOR  Other (See Comments)   FISH ALLERGY     SIMVASTATIN     TRANDOLAPRIL      Last Reviewed by Ronnette Hila, CRNA on 05/16/2013 at 6:49 AM: Review CompleteSpecialty CommentsEditShow AllReport09/15/2014:MAY RELEASE MEDICAL INFO TO Williams Brownrigg 03/11/1940  DOS 05/08/13 TC-CDS-OP- IHR/gen/ CPT 19301/et 04/10/2013 patient scheduled for op surgery 05/16/2013 @ [facility] no precert required. (et,chm)    MedicationsHospital Medications Outpatient Medications  ceFAZolin (ANCEF) 3 g in dextrose 5 % 50 mL IVPB  chlorhexidine (HIBICLENS) 4 % liquid 1 application  fentaNYL (SUBLIMAZE) injection 50-100 mcg  lactated ringers infusion  midazolam (VERSED) injection 1-2 mg    Relevant Labs (3 years)   Na K Cl C02 WBC Hgb Hct Plts  05/16/13 0654 -- -- -- -- -- 12.2 -- --  05/04/13 1025 -- -- -- -- 10.8 13.8 41.2 169  11/24/12 1120 -- -- -- -- 13.5 14.3 41.9 306.0  11/24/12 1120 135 4.2 98 -- -- -- -- --  10/26/12 1315 138 4.1 96 -- -- -- -- --                          Relevant Encounters (Maximum of 5 visits)Date Type Department Provider Description  05/16/2013 Surgery [facility] Rosemarie Galvis A., MD   04/10/2013 Office Visit [facility] Autumne Kallio A., MD Incisional Hernia, Without Obstruction Or Gangrene (Primary ...          My Last Outpatient Progress NoteStatus Last Edited Encounter Date  Signed Mon Apr 10, 2013 11:07 AM EDT 04/10/2013  Patient ID: Griffith Citron, female   DOB: Jan 18, 1941, 72 y.o.   MRN: [id number]    Chief Complaint   Patient presents with   .  New Evaluation       eval hernia      HPI Kelli Velasquez is a 72 y.o. female.  Patient sent at request of Dr. Milinda Antis for incisional hernia. Patient has history of laparoscopic cholecystectomy 10 years ago. She has had a bulge just below her umbilicus for the last 8 years or so. It has become more painful. The pain comes and goes but was severe within the last month. No redness or drainage. No history of bowel obstruction. Pain is sharp in nature severe at times. No clear precipitating  factor. HPI    Past Medical History   Diagnosis  Date   .  Myocardial infarction         subendocardial, initial episode   .  Hyperlipidemia     .  Hypertension     .  Obesity     .  Neoplasm of skin         neoplasm of uncertain behavior of skin   .  Vertigo     .  Myopia     .  Overactive bladder     .  Urinary incontinence     .  Osteoarthritis     .  Asthma     .  Allergy         allergic rhinitis   .  Rash         and other non specific skin eruptions   .  Gallstones     .  Coronary artery disease         cath January 2011 with DEs LAD and RCA   .  GERD (gastroesophageal reflux  disease)         Past Surgical History   Procedure  Laterality  Date   .  Cholecystectomy       .  Joint replacement           right total knee replacement   .  Coronary angioplasty with stent placement    07/2009   .  Total knee arthroplasty           left , then vocal cord infection post op   .  Vocal cord polypectomy           Family History   Problem  Relation  Age of Onset   .  Stroke  Mother     .  Stroke  Father     .  Colon cancer  Neg Hx     .  Breast cancer  Maternal Grandmother     .  Diabetes  Mother  27      Social History History   Substance Use Topics   .  Smoking status:  Former Smoker -- 1.00 packs/day for 25 years       Types:  Cigarettes       Quit date:  07/27/1982   .  Smokeless tobacco:  Never Used   .  Alcohol Use:  Yes         Comment: wine occasional       Allergies   Allergen  Reactions   .  Shellfish Allergy  Anaphylaxis   .  Aspirin         REACTION: nausea and vomiting   .  Atorvastatin         REACTION: leg pain   .  Crestor [Rosuvastatin Calcium]  Other (See Comments)       Muscle pain - allergy/intolerance   .  Fish Allergy     .  Fluticasone Propionate         REACTION: not effective   .  Simvastatin         REACTION: muscle pain   .  Trandolapril         REACTION: leg pain       Current Outpatient Prescriptions   Medication  Sig  Dispense  Refill   .  acetaminophen (TYLENOL) 500 MG tablet  Take 1,000 mg by mouth as needed.          Marland Kitchen  albuterol (PROVENTIL HFA;VENTOLIN HFA) 108 (90 BASE) MCG/ACT inhaler  Inhale 2 puffs into the lungs every 4 (four) hours as needed for wheezing or shortness of breath.   1 Inhaler   5   .  amLODipine (NORVASC) 10 MG tablet  TAKE 1 TABLET BY MOUTH EVERY DAY   30 tablet   2   .  Ascorbic Acid (VITAMIN C) 1000 MG tablet  Take 1,000 mg by mouth daily.           Marland Kitchen  aspirin 81 MG tablet  Take 81 mg by mouth daily.           .  calcium-vitamin D (OSCAL WITH D 500-200) 500-200 MG-UNIT per tablet  Take  1 tablet by mouth daily.           .  cromolyn (NASALCROM) 5.2 MG/ACT nasal spray  Place 2 sprays into the nose daily.          Marland Kitchen  EPINEPHrine (EPI-PEN) 0.3 mg/0.3 mL DEVI  Inject 0.3 mLs (0.3 mg total) into the muscle once. As needed for allergic reaction   1 Device   3   .  ezetimibe (ZETIA) 10 MG tablet  Take 1 tablet (10 mg total) by mouth daily.   30 tablet   11   .  furosemide (LASIX) 20 MG tablet  TAKE 1 TABLET BY MOUTH EVERY DAY AS NEEDED   30 tablet   0   .  nitroGLYCERIN (NITROSTAT) 0.4 MG SL tablet  Take sublingual as directed as needed.          Marland Kitchen  omeprazole (PRILOSEC OTC) 20 MG tablet  Take 20 mg by mouth 2 (two) times daily before a meal.          .  ranitidine (ZANTAC) 150 MG tablet  2 tabs by mouth at bedtime             No current facility-administered medications for this visit.      Review of Systems Review of Systems  Constitutional: Negative.   HENT: Negative.   Eyes: Negative.   Respiratory: Positive for cough.   Cardiovascular: Negative.   Gastrointestinal: Positive for abdominal pain.  Endocrine: Negative.   Genitourinary: Negative.   Musculoskeletal: Negative.   Skin: Negative.   Allergic/Immunologic: Negative.   Neurological: Negative.   Hematological: Negative.   Psychiatric/Behavioral: Negative.     Blood pressure 144/72, pulse 51, temperature 96.8 F (36 C), temperature source Temporal, height 5\' 5"  (1.651 m), weight 236 lb 3.2 oz (107.14 kg), last menstrual period 07/27/1980, SpO2 95.00%.   Physical Exam Physical Exam  Constitutional: She is oriented to person, place, and time. She appears well-developed and well-nourished.  HENT:   Head: Normocephalic and atraumatic.  Eyes: EOM are normal. Pupils are equal, round, and reactive to light.  Neck: Normal range of motion. Neck supple.  Cardiovascular: Normal rate and regular rhythm.  Exam reveals no friction rub.    No murmur heard. Occasional  PVC  Pulmonary/Chest: Breath sounds normal. No  respiratory distress. She has no wheezes.  Abdominal: Soft. There is no tenderness. There is no rigidity, no rebound and no guarding. A hernia is present.    Musculoskeletal: Normal range of motion.  Neurological: She is alert and oriented to person, place, and time.  Skin: Skin is warm and dry.  Psychiatric: She has a normal mood and affect. Her behavior is normal. Judgment and thought content normal.    Data Reviewed Dr Lucretia Roers notes.  Assessment Infraumbilical incisional hernia from previous laparoscopic cholecystectomy 10 years ago   Plan Recommend repair of infraumbilical incisional hernia with mesh.The risk of hernia repair include bleeding,  Infection,   Recurrence of the hernia,  Mesh use, chronic pain,  Organ injury,  Bowel injury,  Bladder injury,   nerve injury with numbness around the incision,  Death,  and worsening of preexisting  medical problems.  The alternatives to surgery have been discussed as well..  Long term expectations of both operative and non operative treatments have been discussed.   The patient agrees to proceed.       Laneisha Mino A. 05/16/2013

## 2013-05-16 NOTE — Transfer of Care (Signed)
Immediate Anesthesia Transfer of Care Note  Patient: Kelli Velasquez  Procedure(s) Performed: Procedure(s) with comments: HERNIA REPAIR INFRAUMILICAL INCISIONAL (N/A) - umbilical INSERTION OF MESH (N/A) - umbilical  Patient Location: PACU  Anesthesia Type:General  Level of Consciousness: awake, alert  and oriented  Airway & Oxygen Therapy: Patient Spontanous Breathing and Patient connected to face mask oxygen  Post-op Assessment: Report given to PACU RN and Post -op Vital signs reviewed and stable  Post vital signs: Reviewed and stable  Complications: No apparent anesthesia complications

## 2013-05-16 NOTE — Anesthesia Postprocedure Evaluation (Signed)
  Anesthesia Post-op Note  Patient: Kelli Velasquez  Procedure(s) Performed: Procedure(s) with comments: HERNIA REPAIR INFRAUMILICAL INCISIONAL (N/A) - umbilical INSERTION OF MESH (N/A) - umbilical  Patient Location: PACU  Anesthesia Type:General  Level of Consciousness: awake  Airway and Oxygen Therapy: Patient Spontanous Breathing  Post-op Pain: mild  Post-op Assessment: Post-op Vital signs reviewed  Post-op Vital Signs: Reviewed  Complications: No apparent anesthesia complications

## 2013-05-18 ENCOUNTER — Encounter (HOSPITAL_BASED_OUTPATIENT_CLINIC_OR_DEPARTMENT_OTHER): Payer: Self-pay | Admitting: Surgery

## 2013-05-29 ENCOUNTER — Ambulatory Visit (INDEPENDENT_AMBULATORY_CARE_PROVIDER_SITE_OTHER): Payer: Medicare Other | Admitting: Surgery

## 2013-05-29 ENCOUNTER — Encounter (INDEPENDENT_AMBULATORY_CARE_PROVIDER_SITE_OTHER): Payer: Self-pay | Admitting: Surgery

## 2013-05-29 VITALS — BP 132/80 | HR 76 | Temp 97.4°F | Resp 16 | Ht 65.5 in | Wt 237.0 lb

## 2013-05-29 DIAGNOSIS — Z9889 Other specified postprocedural states: Secondary | ICD-10-CM

## 2013-05-29 NOTE — Patient Instructions (Signed)
Resume full activities in 10 days.

## 2013-05-29 NOTE — Progress Notes (Signed)
The patient returns after repair of infraumbilical ventral hernia with mesh. She is 17 days out. She is doing well. No fever or chills. Bowels moving.  Exam: Incision clean dry and intact.  Impression: Status post repair of ventral hernia with mesh 17 days out  Plan: Resume full activity in 10 days. Return to clinic as needed.

## 2013-06-13 ENCOUNTER — Encounter: Payer: Self-pay | Admitting: Family Medicine

## 2013-06-13 ENCOUNTER — Ambulatory Visit (INDEPENDENT_AMBULATORY_CARE_PROVIDER_SITE_OTHER): Payer: Medicare Other | Admitting: Family Medicine

## 2013-06-13 VITALS — BP 120/70 | HR 91 | Temp 98.1°F | Ht 64.25 in | Wt 234.2 lb

## 2013-06-13 DIAGNOSIS — I10 Essential (primary) hypertension: Secondary | ICD-10-CM

## 2013-06-13 DIAGNOSIS — R7309 Other abnormal glucose: Secondary | ICD-10-CM

## 2013-06-13 DIAGNOSIS — E785 Hyperlipidemia, unspecified: Secondary | ICD-10-CM

## 2013-06-13 LAB — LIPID PANEL
HDL: 38.4 mg/dL — ABNORMAL LOW (ref >39.00)
Total CHOL/HDL Ratio: 4

## 2013-06-13 LAB — COMPREHENSIVE METABOLIC PANEL
ALT: 22 U/L (ref 0–35)
AST: 22 U/L (ref 0–37)
Albumin: 3.7 g/dL (ref 3.5–5.2)
CO2: 32 mEq/L (ref 19–32)
Calcium: 9.1 mg/dL (ref 8.4–10.5)
Chloride: 102 mEq/L (ref 96–112)
Creatinine, Ser: 0.8 mg/dL (ref 0.4–1.2)
GFR: 71.56 mL/min (ref >60.00)
Glucose, Bld: 101 mg/dL — ABNORMAL HIGH (ref 70–99)
Potassium: 4 mEq/L (ref 3.5–5.1)

## 2013-06-13 LAB — HEMOGLOBIN A1C: Hgb A1c MFr Bld: 5.4 % (ref 4.6–6.5)

## 2013-06-13 MED ORDER — EZETIMIBE 10 MG PO TABS: 10.0000 mg | ORAL_TABLET | Freq: Every day | ORAL | Status: DC

## 2013-06-13 MED ORDER — AMLODIPINE BESYLATE 10 MG PO TABS: 10.0000 mg | ORAL_TABLET | Freq: Every day | ORAL | Status: DC

## 2013-06-13 MED ORDER — FUROSEMIDE 20 MG PO TABS: ORAL_TABLET | ORAL | Status: DC

## 2013-06-13 NOTE — Assessment & Plan Note (Signed)
BP: 120/70 mmHg  bp in fair control at this time  No changes needed  Disc lifstyle change with low sodium diet and exercise   Lab today

## 2013-06-13 NOTE — Patient Instructions (Signed)
Labs today  Keep working on healthy diet and exercise and weight loss

## 2013-06-13 NOTE — Assessment & Plan Note (Signed)
A1C today Disc low glycemic diet -and inst not to over indulge in fruit (disc serving sizes) Wt loss recommended

## 2013-06-13 NOTE — Assessment & Plan Note (Signed)
Lipid panel today On zetia-intol of statin Disc goals for lipids and reasons to control them Rev llast  labs with pt Rev low sat fat diet in detail

## 2013-06-13 NOTE — Progress Notes (Signed)
Subjective:    Patient ID: Kelli Velasquez, female    DOB: 25-Oct-1940, 72 y.o.   MRN: [id number]  HPI Here for f/u of chronic health problems   Wt is down 3 lb with bmi of 39 She is working on that   Found out her chronic cough is caused by gerd-on prilosec and zantac - she has stayed away from fried foods/ french fries/ - is eating better / also avoiding chocolate and mint  Is getting better   She had surgery for hernia - that went wall and easy to go through  Much better   Had her flu shot  bp is stable today  No cp or palpitations or headaches or edema  No side effects to medicines  BP Readings from Last 3 Encounters:  06/13/13 128/90  05/29/13 132/80  05/16/13 143/56     Hyperlipidemia On zetia Lab Results  Component Value Date   CHOL 173 09/15/2012   HDL 41.70 09/15/2012   LDLCALC 94 09/15/2012   LDLDIRECT 124.8 09/01/2007   TRIG 185.0* 09/15/2012   CHOLHDL 4 09/15/2012   intol of statins  Diet - is better  She is fasting for labs   Hx of hyperglycemia in past - with stable a1c Lab Results  Component Value Date   HGBA1C 5.4 06/07/2012    She still eats a low sugar diet - but too much fruit   Patient Active Problem List   Diagnosis Date Noted  . Hernia, incisional 04/03/2013  . Pulmonary nodules 02/02/2013  . Cough 12/18/2012  . Hypoxemia 10/26/2012  . Muscle cramps 10/26/2012  . Special screening for malignant neoplasms, colon 06/16/2011  . Other screening mammogram 06/16/2011  . Preseptal cellulitis 06/05/2011  . DYSPHONIA 10/10/2010  . HYPERGLYCEMIA 05/27/2010  . CAD, NATIVE VESSEL 08/19/2009  . 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 OBESITY 09/01/2007  . MYOPIA 03/03/2007  . HYPERLIPIDEMIA 10/15/2006  . HYPERTENSION 10/15/2006  . ALLERGIC RHINITIS 10/15/2006  . ASTHMA 10/15/2006  . OVERACTIVE BLADDER 10/15/2006  . OSTEOARTHRITIS 10/15/2006  . URINARY INCONTINENCE 10/15/2006   Past Medical History  Diagnosis Date  .  Myocardial infarction     subendocardial, initial episode  . Hyperlipidemia   . Hypertension   . Obesity   . Neoplasm of skin     neoplasm of uncertain behavior of skin  . Vertigo   . Myopia   . Overactive bladder   . Urinary incontinence   . Osteoarthritis   . Asthma   . Allergy     allergic rhinitis  . Rash     and other non specific skin eruptions  . Gallstones   . Coronary artery disease     cath January 2011 with DEs LAD and RCA  . GERD (gastroesophageal reflux disease)   . Nocturnal oxygen desaturation     o2 at night  . Wears glasses   . Full dentures    Past Surgical History  Procedure Laterality Date  . Total knee arthroplasty  2010    left , then vocal cord infection post op  . Vocal cord polypectomy    . Cholecystectomy  89  . Joint replacement  2007    right total knee replacement  . Dilation and curettage of uterus  1984  . Colonoscopy    . Coronary angioplasty with stent placement  07/2009    stent  . Incisional hernia repair N/A 05/16/2013    Procedure: HERNIA REPAIR INFRAUMILICAL INCISIONAL;  Surgeon: Clovis Pu. Cornett,  MD;  Location: [facility];  Service: General;  Laterality: N/A;  umbilical  . Insertion of mesh N/A 05/16/2013    Procedure: INSERTION OF MESH;  Surgeon: Clovis Pu. Cornett, MD;  Location: [facility];  Service: General;  Laterality: N/A;  umbilical   History  Substance Use Topics  . Smoking status: Former Smoker -- 1.00 packs/day for 25 years    Types: Cigarettes    Quit date: 07/27/1982  . Smokeless tobacco: Never Used  . Alcohol Use: Yes     Comment: wine occasional   Family History  Problem Relation Age of Onset  . Stroke Mother   . Stroke Father   . Colon cancer Neg Hx   . Breast cancer Maternal Grandmother   . Diabetes Mother 37   Allergies  Allergen Reactions  . Shellfish Allergy Anaphylaxis  . Aspirin     REACTION: nausea and vomiting  . Atorvastatin     REACTION: leg pain  . Crestor  [Rosuvastatin Calcium] Other (See Comments)    Muscle pain - allergy/intolerance  . Fish Allergy   . Simvastatin     REACTION: muscle pain  . Trandolapril     REACTION: leg pain   Current Outpatient Prescriptions on File Prior to Visit  Medication Sig Dispense Refill  . acetaminophen (TYLENOL) 500 MG tablet Take 1,000 mg by mouth as needed.       Marland Kitchen albuterol (PROVENTIL HFA;VENTOLIN HFA) 108 (90 BASE) MCG/ACT inhaler Inhale 2 puffs into the lungs every 4 (four) hours as needed for wheezing or shortness of breath.  1 Inhaler  5  . amLODipine (NORVASC) 10 MG tablet TAKE 1 TABLET BY MOUTH EVERY DAY  30 tablet  2  . Ascorbic Acid (VITAMIN C) 1000 MG tablet Take 1,000 mg by mouth daily.        Marland Kitchen aspirin 81 MG tablet Take 81 mg by mouth daily.        . calcium-vitamin D (OSCAL WITH D 500-200) 500-200 MG-UNIT per tablet Take 1 tablet by mouth daily.        . cromolyn (NASALCROM) 5.2 MG/ACT nasal spray Place 2 sprays into the nose daily.       Marland Kitchen EPINEPHrine (EPI-PEN) 0.3 mg/0.3 mL DEVI Inject 0.3 mLs (0.3 mg total) into the muscle once. As needed for allergic reaction  1 Device  3  . ezetimibe (ZETIA) 10 MG tablet Take 1 tablet (10 mg total) by mouth daily.  30 tablet  11  . furosemide (LASIX) 20 MG tablet TAKE 1 TABLET BY MOUTH EVERY DAY AS NEEDED  30 tablet  1  . nitroGLYCERIN (NITROSTAT) 0.4 MG SL tablet Take sublingual as directed as needed.       Marland Kitchen omeprazole (PRILOSEC OTC) 20 MG tablet Take 20 mg by mouth 2 (two) times daily before a meal.       . ranitidine (ZANTAC) 150 MG tablet 2 tabs by mouth at bedtime       No current facility-administered medications on file prior to visit.      Review of Systems Review of Systems  Constitutional: Negative for fever, appetite change, fatigue and unexpected weight change.  Eyes: Negative for pain and visual disturbance.  Respiratory: Negative for cough and shortness of breath.   Cardiovascular: Negative for cp or palpitations    Gastrointestinal:  Negative for nausea, diarrhea and constipation.  Genitourinary: Negative for urgency and frequency.  Skin: Negative for pallor or rash   Neurological: Negative for weakness,  light-headedness, numbness and headaches.  Hematological: Negative for adenopathy. Does not bruise/bleed easily.  Psychiatric/Behavioral: Negative for dysphoric mood. The patient is not nervous/anxious.         Objective:   Physical Exam  Constitutional: She appears well-developed and well-nourished. No distress.  obese and well appearing   HENT:  Head: Normocephalic and atraumatic.  Mouth/Throat: Oropharynx is clear and moist.  Eyes: Conjunctivae and EOM are normal. Pupils are equal, round, and reactive to light. No scleral icterus.  Neck: Normal range of motion. Neck supple. No JVD present. Carotid bruit is not present. No thyromegaly present.  Cardiovascular: Normal rate, regular rhythm, normal heart sounds and intact distal pulses.  Exam reveals no gallop.   Pulmonary/Chest: Effort normal and breath sounds normal. No respiratory distress. She has no wheezes. She exhibits no tenderness.  Abdominal: She exhibits no abdominal bruit.  Genitourinary: No breast swelling, tenderness, discharge or bleeding.  Musculoskeletal: Normal range of motion. She exhibits no edema.  Lymphadenopathy:    She has no cervical adenopathy.  Neurological: She is alert. She has normal reflexes. No cranial nerve deficit. She exhibits normal muscle tone. Coordination normal.  Skin: Skin is warm and dry. No rash noted. No erythema. No pallor.  Psychiatric: She has a normal mood and affect.          Assessment & Plan:

## 2013-06-13 NOTE — Progress Notes (Signed)
Pre-visit discussion using our clinic review tool. No additional management support is needed unless otherwise documented below in the visit note.  

## 2013-06-15 ENCOUNTER — Other Ambulatory Visit: Payer: Self-pay | Admitting: Internal Medicine

## 2013-06-15 ENCOUNTER — Telehealth: Payer: Self-pay | Admitting: Internal Medicine

## 2013-06-15 ENCOUNTER — Ambulatory Visit (INDEPENDENT_AMBULATORY_CARE_PROVIDER_SITE_OTHER)
Admission: RE | Admit: 2013-06-15 | Discharge: 2013-06-15 | Disposition: A | Discharge status: Home / Self Care | Payer: Medicare Other | Source: Ambulatory Visit | Attending: Adult Health | Admitting: Adult Health

## 2013-06-15 DIAGNOSIS — R918 Other nonspecific abnormal finding of lung field: Secondary | ICD-10-CM

## 2013-06-15 NOTE — Telephone Encounter (Signed)
Called and spoke with pt and she is aware of the pet scan that is scheduled and she will keep her appt with MW.  Pt is aware that we will call her once the results of the PET scan are back.

## 2013-06-19 ENCOUNTER — Ambulatory Visit (INDEPENDENT_AMBULATORY_CARE_PROVIDER_SITE_OTHER): Payer: Medicare Other | Admitting: Internal Medicine

## 2013-06-19 ENCOUNTER — Telehealth: Payer: Self-pay | Admitting: Internal Medicine

## 2013-06-19 ENCOUNTER — Encounter: Payer: Self-pay | Admitting: Internal Medicine

## 2013-06-19 VITALS — BP 120/70 | HR 96 | Temp 98.8°F | Ht 65.5 in | Wt 233.5 lb

## 2013-06-19 DIAGNOSIS — J209 Acute bronchitis, unspecified: Secondary | ICD-10-CM

## 2013-06-19 MED ORDER — AZITHROMYCIN 250 MG PO TABS: ORAL_TABLET | ORAL | Status: DC

## 2013-06-19 NOTE — Telephone Encounter (Signed)
I spoke with pt spouse. He reports pt was dx with acute bronchitis and is on ABX. This is FYI for Dr. Sherene Sires. Also when pt went for CT scan they were not told to not use powder or deodorant. Pt did. He reports they are not going out of town for thanksgiving either. Please advise Dr. Sherene Sires thanks

## 2013-06-19 NOTE — Progress Notes (Signed)
Subjective:    Patient ID: Kelli Velasquez, female    DOB: April 07, 1941, 72 y.o.   MRN: [id number]  HPI  Pt presents to the clinic today with c/o cough. This started Friday night. The cough is unproductive. She has had fever up to 99.9, chills and body aches. She is taking cherritussin, tylenol and her regular medication. She does have a history of allergies and asthma. She has had sick contacts.  Review of Systems      Past Medical History  Diagnosis Date  . Myocardial infarction     subendocardial, initial episode  . Hyperlipidemia   . Hypertension   . Obesity   . Neoplasm of skin     neoplasm of uncertain behavior of skin  . Vertigo   . Myopia   . Overactive bladder   . Urinary incontinence   . Osteoarthritis   . Asthma   . Allergy     allergic rhinitis  . Rash     and other non specific skin eruptions  . Gallstones   . Coronary artery disease     cath January 2011 with DEs LAD and RCA  . GERD (gastroesophageal reflux disease)   . Nocturnal oxygen desaturation     o2 at night  . Wears glasses   . Full dentures     Current Outpatient Prescriptions  Medication Sig Dispense Refill  . acetaminophen (TYLENOL) 500 MG tablet Take 1,000 mg by mouth as needed.       Marland Kitchen amLODipine (NORVASC) 10 MG tablet Take 1 tablet (10 mg total) by mouth daily.  30 tablet  11  . Ascorbic Acid (VITAMIN C) 1000 MG tablet Take 1,000 mg by mouth daily.        Marland Kitchen aspirin 81 MG tablet Take 81 mg by mouth daily.        . calcium-vitamin D (OSCAL WITH D 500-200) 500-200 MG-UNIT per tablet Take 1 tablet by mouth daily.        . cromolyn (NASALCROM) 5.2 MG/ACT nasal spray Place 2 sprays into the nose daily.       Marland Kitchen ezetimibe (ZETIA) 10 MG tablet Take 1 tablet (10 mg total) by mouth daily.  30 tablet  11  . furosemide (LASIX) 20 MG tablet TAKE 1 TABLET BY MOUTH EVERY DAY AS NEEDED  30 tablet  11  . HYDROcodone-homatropine (HYCODAN) 5-1.5 MG/5ML syrup Take 5 mLs by mouth every 6 (six) hours as needed for  cough.      Marland Kitchen omeprazole (PRILOSEC OTC) 20 MG tablet Take 20 mg by mouth 2 (two) times daily before a meal.       . ranitidine (ZANTAC) 150 MG tablet 2 tabs by mouth at bedtime      . albuterol (PROVENTIL HFA;VENTOLIN HFA) 108 (90 BASE) MCG/ACT inhaler Inhale 2 puffs into the lungs every 4 (four) hours as needed for wheezing or shortness of breath.  1 Inhaler  5  . EPINEPHrine (EPI-PEN) 0.3 mg/0.3 mL DEVI Inject 0.3 mLs (0.3 mg total) into the muscle once. As needed for allergic reaction  1 Device  3  . nitroGLYCERIN (NITROSTAT) 0.4 MG SL tablet Take sublingual as directed as needed.        No current facility-administered medications for this visit.    Allergies  Allergen Reactions  . Shellfish Allergy Anaphylaxis  . Aspirin     REACTION: nausea and vomiting  . Atorvastatin     REACTION: leg pain  . Crestor [Rosuvastatin Calcium] Other (See Comments)  Muscle pain - allergy/intolerance  . Fish Allergy   . Simvastatin     REACTION: muscle pain  . Trandolapril     REACTION: leg pain    Family History  Problem Relation Age of Onset  . Stroke Mother   . Stroke Father   . Colon cancer Neg Hx   . Breast cancer Maternal Grandmother   . Diabetes Mother 57    History   Social History  . Marital Status: Married    Spouse Name: N/A    Number of Children: N/A  . Years of Education: N/A   Occupational History  . Not on file.   Social History Main Topics  . Smoking status: Former Smoker -- 1.00 packs/day for 25 years    Types: Cigarettes    Quit date: 07/27/1982  . Smokeless tobacco: Never Used  . Alcohol Use: Yes     Comment: wine occasional  . Drug Use: No  . Sexual Activity: Not on file   Other Topics Concern  . Not on file   Social History Narrative  . No narrative on file     Constitutional: Denies fever, malaise, fatigue, headache or abrupt weight changes.  HEENT: Denies eye pain, eye redness, ear pain, ringing in the ears, wax buildup, runny nose, nasal  congestion, bloody nose, or sore throat. Respiratory: Pt reports cough. Denies difficulty breathing, shortness of breath,  or sputum production.    No other specific complaints in a complete review of systems (except as listed in HPI above).  Objective:   Physical Exam   BP 120/70  Pulse 96  Temp(Src) 98.8 F (37.1 C) (Oral)  Ht 5' 5.5" (1.664 m)  Wt 233 lb 8 oz (105.915 kg)  BMI 38.25 kg/m2  SpO2 88%  LMP 07/27/1980 Wt Readings from Last 3 Encounters:  06/19/13 233 lb 8 oz (105.915 kg)  06/13/13 234 lb 4 oz (106.255 kg)  05/29/13 237 lb (107.502 kg)    General: Appears her stated age, obese but well developed, well nourished in NAD. Skin: Warm, dry and intact. No rashes, lesions or ulcerations noted. HEENT: Head: normal shape and size; Eyes: sclera white, no icterus, conjunctiva pink, PERRLA and EOMs intact; Ears: Tm's gray and intact, normal light reflex; Nose: mucosa pink and moist, septum midline; Throat/Mouth: Teeth present, mucosa pink and moist, no exudate, lesions or ulcerations noted.  Neck: Normal range of motion. Neck supple, trachea midline. No massses, lumps or thyromegaly present.  Cardiovascular: Normal rate and rhythm. S1,S2 noted.  No murmur, rubs or gallops noted. No JVD or BLE edema. No carotid bruits noted. Pulmonary/Chest: Normal effort and diminished in the RLL with bilateral expiratory wheeze. No respiratory distress.    EKG:  BMET    Component Value Date/Time   NA 140 06/13/2013 0928   K 4.0 06/13/2013 0928   CL 102 06/13/2013 0928   CO2 32 06/13/2013 0928   GLUCOSE 101* 06/13/2013 0928   BUN 16 06/13/2013 0928   CREATININE 0.8 06/13/2013 0928   CALCIUM 9.1 06/13/2013 0928   GFRNONAA 52.15 05/20/2010 0811   GFRAA  Value: >60        The eGFR has been calculated using the MDRD equation. This calculation has not been validated in all clinical situations. eGFR's persistently <60 mL/min signify possible Chronic Kidney Disease. 07/30/2009 0610    Lipid  Panel     Component Value Date/Time   CHOL 160 06/13/2013 0928   TRIG 183.0* 06/13/2013 0928   HDL 38.40* 06/13/2013  0928   CHOLHDL 4 06/13/2013 0928   VLDL 36.6 06/13/2013 0928   LDLCALC 85 06/13/2013 0928    CBC    Component Value Date/Time   WBC 10.8* 05/04/2013 1025   RBC 4.41 05/04/2013 1025   HGB 12.2 05/16/2013 0654   HCT 41.2 05/04/2013 1025   PLT 169 05/04/2013 1025   MCV 93.4 05/04/2013 1025   MCH 31.3 05/04/2013 1025   MCHC 33.5 05/04/2013 1025   RDW 15.3 05/04/2013 1025   LYMPHSABS 2.2 05/04/2013 1025   MONOABS 0.8 05/04/2013 1025   EOSABS 0.3 05/04/2013 1025   BASOSABS 0.0 05/04/2013 1025    Hgb A1C Lab Results  Component Value Date   HGBA1C 5.4 06/13/2013        Assessment & Plan:   Acute Bronchitis:  eRx for azithromax x 5 days Ok to continue cheritussion Use your albuterol inhaler every 6 hours x 24-48 hours  RTC as needed or if symptoms persist or worsen

## 2013-06-19 NOTE — Progress Notes (Signed)
Pre-visit discussion using our clinic review tool. No additional management support is needed unless otherwise documented below in the visit note.  

## 2013-06-19 NOTE — Patient Instructions (Signed)
Acute Bronchitis Bronchitis is inflammation of the airways that extend from the windpipe into the lungs (bronchi). The inflammation often causes mucus to develop. This leads to a cough, which is the most common symptom of bronchitis.  In acute bronchitis, the condition usually develops suddenly and goes away over time, usually in a couple weeks. Smoking, allergies, and asthma can make bronchitis worse. Repeated episodes of bronchitis may cause further lung problems.  CAUSES Acute bronchitis is most often caused by the same virus that causes a cold. The virus can spread from person to person (contagious).  SIGNS AND SYMPTOMS   Cough.   Fever.   Coughing up mucus.   Body aches.   Chest congestion.   Chills.   Shortness of breath.   Sore throat.  DIAGNOSIS  Acute bronchitis is usually diagnosed through a physical exam. Tests, such as chest X-rays, are sometimes done to rule out other conditions.  TREATMENT  Acute bronchitis usually goes away in a couple weeks. Often times, no medical treatment is necessary. Medicines are sometimes given for relief of fever or cough. Antibiotics are usually not needed but may be prescribed in certain situations. In some cases, an inhaler may be recommended to help reduce shortness of breath and control the cough. A cool mist vaporizer may also be used to help thin bronchial secretions and make it easier to clear the chest.  HOME CARE INSTRUCTIONS  Get plenty of rest.   Drink enough fluids to keep your urine clear or pale yellow (unless you have a medical condition that requires fluid restriction). Increasing fluids may help thin your secretions and will prevent dehydration.   Only take over-the-counter or prescription medicines as directed by your health care provider.   Avoid smoking and secondhand smoke. Exposure to cigarette smoke or irritating chemicals will make bronchitis worse. If you are a smoker, consider using nicotine gum or skin  patches to help control withdrawal symptoms. Quitting smoking will help your lungs heal faster.   Reduce the chances of another bout of acute bronchitis by washing your hands frequently, avoiding people with cold symptoms, and trying not to touch your hands to your mouth, nose, or eyes.   Follow up with your health care provider as directed.  SEEK MEDICAL CARE IF: Your symptoms do not improve after 1 week of treatment.  SEEK IMMEDIATE MEDICAL CARE IF:  You develop an increased fever or chills.   You have chest pain.   You have severe shortness of breath.  You have bloody sputum.   You develop dehydration.  You develop fainting.  You develop repeated vomiting.  You develop a severe headache. MAKE SURE YOU:   Understand these instructions.  Will watch your condition.  Will get help right away if you are not doing well or get worse. Document Released: 08/20/2004 Document Revised: 03/15/2013 Document Reviewed: 01/03/2013 [employer] Patient Information 2014 [employer].  

## 2013-06-26 ENCOUNTER — Ambulatory Visit: Payer: Medicare Other | Admitting: Internal Medicine

## 2013-06-26 ENCOUNTER — Telehealth: Payer: Self-pay

## 2013-06-26 NOTE — Telephone Encounter (Signed)
Pt was seen 06/19/13 by Nicki Reaper; pt has just finished z pack, pt still has prod cough with white phlegm, some SOB but [pt is using oxygen 2L round the clock instead of only at night time.  Pt has fever on and off, chills on and off and ribs hurt from coughing. No wheezing. [facility]. Pt scheduled appt with Dr Milinda Antis 06/27/13 at 11:45 am. Pt did not think she could come to office today. Pt was advised if condition changes or worsens to call our office during the day or go to UC at night. Pt voiced understanding.

## 2013-06-26 NOTE — Telephone Encounter (Signed)
Thanks- I will see her tomorrow but definitely suggest earlier eval at [facility] or ER if worse tonight -please keep me updated

## 2013-06-27 ENCOUNTER — Encounter: Payer: Self-pay | Admitting: Family Medicine

## 2013-06-27 ENCOUNTER — Telehealth (INDEPENDENT_AMBULATORY_CARE_PROVIDER_SITE_OTHER): Payer: Medicare Other | Admitting: Family Medicine

## 2013-06-27 ENCOUNTER — Ambulatory Visit (INDEPENDENT_AMBULATORY_CARE_PROVIDER_SITE_OTHER): Payer: Medicare Other | Admitting: Family Medicine

## 2013-06-27 ENCOUNTER — Ambulatory Visit (INDEPENDENT_AMBULATORY_CARE_PROVIDER_SITE_OTHER)
Admission: RE | Admit: 2013-06-27 | Discharge: 2013-06-27 | Disposition: A | Discharge status: Home / Self Care | Payer: Medicare Other | Source: Ambulatory Visit | Attending: Family Medicine | Admitting: Family Medicine

## 2013-06-27 VITALS — BP 138/66 | HR 98 | Temp 98.4°F | Ht 65.25 in | Wt 230.5 lb

## 2013-06-27 DIAGNOSIS — R0609 Other forms of dyspnea: Secondary | ICD-10-CM

## 2013-06-27 DIAGNOSIS — R0600 Dyspnea, unspecified: Secondary | ICD-10-CM

## 2013-06-27 DIAGNOSIS — R05 Cough: Secondary | ICD-10-CM

## 2013-06-27 DIAGNOSIS — R0902 Hypoxemia: Secondary | ICD-10-CM

## 2013-06-27 DIAGNOSIS — R9389 Abnormal findings on diagnostic imaging of other specified body structures: Secondary | ICD-10-CM

## 2013-06-27 DIAGNOSIS — J209 Acute bronchitis, unspecified: Secondary | ICD-10-CM

## 2013-06-27 DIAGNOSIS — I10 Essential (primary) hypertension: Secondary | ICD-10-CM

## 2013-06-27 LAB — CBC WITH DIFFERENTIAL/PLATELET
Basophils Absolute: 0.1 10*3/uL (ref 0.0–0.1)
Basophils Relative: 0.6 % (ref 0.0–3.0)
HCT: 43.1 % (ref 36.0–46.0)
Hemoglobin: 14.2 g/dL (ref 12.0–15.0)
Lymphocytes Relative: 14.1 % (ref 12.0–46.0)
MCHC: 33.1 g/dL (ref 30.0–36.0)
MCV: 91.6 fl (ref 78.0–100.0)
Monocytes Absolute: 0.8 10*3/uL (ref 0.1–1.0)
Monocytes Relative: 7.5 % (ref 3.0–12.0)
Neutro Abs: 8.3 10*3/uL — ABNORMAL HIGH (ref 1.4–7.7)
Platelets: 381 10*3/uL (ref 150.0–400.0)
RBC: 4.71 Mil/uL (ref 3.87–5.11)

## 2013-06-27 LAB — COMPREHENSIVE METABOLIC PANEL
BUN: 17 mg/dL (ref 6–23)
CO2: 29 mEq/L (ref 19–32)
Calcium: 9.8 mg/dL (ref 8.4–10.5)
Chloride: 100 mEq/L (ref 96–112)
Creatinine, Ser: 1.2 mg/dL (ref 0.4–1.2)
GFR: 47.67 mL/min — ABNORMAL LOW (ref >60.00)
Total Protein: 7.6 g/dL (ref 6.0–8.3)

## 2013-06-27 MED ORDER — CLARITHROMYCIN 500 MG PO TABS: 500.0000 mg | ORAL_TABLET | Freq: Two times a day (BID) | ORAL | Status: DC

## 2013-06-27 NOTE — Progress Notes (Signed)
   Subjective:    Patient ID: Kelli Velasquez, female    DOB: 11-30-1940, 72 y.o.   MRN: [id number]  HPI Here with symptoms of bronchitis  Got sick just before thanksgiving  Came in and saw Rene Kocher -took antibiotic and finished that  Overall improved but just not completely better   Prod cough -brown to yellow sputum (not a lot) Using 02 at home day and night instead of just night - 2 L (when on this she is not short of breath)  Wheezing a little -not too bad Used the albuterol 3-4 times over the past week - at most   Has hycodan cough med- helps a bit- during the day Has tried cheratussin also -that may work better   She feels a bit better from last night - today is better  Has not had a fever    Has PET scan planned with pulmonary on Monday Being worked up for nodules/ ground glass opacity on CT scan and hypoxia  ? How much of her symptoms are old or new  Baseline pulse ox off 02 while ambulating is about 85%  Review of Systems Review of Systems  Constitutional: Negative for fever, appetite change, fatigue and unexpected weight change.  ENT pos for congestion/ rhinorrhea but no sinus pain  Eyes: Negative for pain and visual disturbance.  Respiratory: pos for persistent cough/ sometimes productive and mild wheezing    Cardiovascular: Negative for cp or palpitations    Gastrointestinal: Negative for nausea, diarrhea and constipation.  Genitourinary: Negative for urgency and frequency. neg for dysuria  Skin: Negative for pallor or rash   Neurological: Negative for weakness, light-headedness, numbness and headaches.  Hematological: Negative for adenopathy. Does not bruise/bleed easily.  Psychiatric/Behavioral: Negative for dysphoric mood. The patient is not nervous/anxious.         Objective:   Physical Exam  Constitutional: She appears well-developed and well-nourished. No distress.  Obese and breathing comfortably at rest  HENT:  Head: Normocephalic and atraumatic.    Right Ear: External ear normal.  Left Ear: External ear normal.  Mouth/Throat: Oropharynx is clear and moist. No oropharyngeal exudate.  Nares are injected and congested  No sinus tenderness   Eyes: Conjunctivae and EOM are normal. Pupils are equal, round, and reactive to light. Right eye exhibits no discharge. Left eye exhibits no discharge.  Neck: Normal range of motion. Neck supple.  Cardiovascular: Normal rate and normal heart sounds.   Rate in 90s at rest  Pulmonary/Chest: Effort normal. No respiratory distress. She has wheezes. She has no rales. She exhibits no tenderness.  Scant wheeze on forced exp only Crackles at bases  Fair air exch and no prolonged exp phase  Not short of breath wearing nasal cannula at 2L  Musculoskeletal: She exhibits no edema.  Lymphadenopathy:    She has no cervical adenopathy.  Neurological: She is alert. She has normal reflexes.  Skin: Skin is warm and dry. No rash noted. No erythema. No pallor.  Psychiatric: She has a normal mood and affect.          Assessment & Plan:

## 2013-06-27 NOTE — Progress Notes (Signed)
   Subjective:    Patient ID: Kelli Velasquez, female    DOB: 1940-11-01, 72 y.o.   MRN: [id number]  HPI    Review of Systems     Objective:   Physical Exam        Assessment & Plan:

## 2013-06-27 NOTE — Assessment & Plan Note (Addendum)
With acute on chronic hypoxia -improved on 02  Also abn CT- pending PET scan on Monday with pulm f/u  S/p zpak and cough med for bronchitis cxr today  Cbc, cmet and BNP also pending    Addendum: cxr report showing worsened bilateral nodular airspace opacities - primarily at bases , radiologist cannot r/o septic emboli   Disc case with Dr Maple Hudson in Pulmonary and he reviewed the chest xray - he suggested that if the pt is stable : to get blood cultures first thing in the am as well as ua along with ESR, ANA, ACE and then start her on Biaxin for 10 days as soon as she has had the blood draw  He will alert the pt's pulmonologist Dr Sherene Sires as well for further plan (aware she has PET scan sched for Monday and condition does not worsen)

## 2013-06-27 NOTE — Assessment & Plan Note (Signed)
Improved after zpak but still not 100% better on cough med  Cough kept her up last night and feels some better today  cxr today - pend radiology review  Complex pulmonary hx in addn to this with hypoxia/ abn CT -- has PET scan scheduled for Monday  She is more comfortable on 02 and will inc flow to 3L at home when active

## 2013-06-27 NOTE — Assessment & Plan Note (Signed)
BP: 138/66 mmHg   Lab for cmet today

## 2013-06-27 NOTE — Progress Notes (Signed)
Pre-visit discussion using our clinic review tool. No additional management support is needed unless otherwise documented below in the visit note.  

## 2013-06-27 NOTE — Telephone Encounter (Signed)
Spoke to pt by phone - she is home and feeling better - continues her 02 by nasal cannula and reports cough is improved as is shortness of breath , and denies fever  I rev cxr report with her   I informed her of plan for am to draw blood cultures, check ESR, ANA, ACE and ua - per adv of Dr Maple Hudson and that Dr Sherene Sires will also be alerted  I will order these for am and then she is to start biaxin for a 10 day course (I will have px for her to pick up at blood draw) Told her to come at 8 am  We will inst further from there when labs return and Dr Sherene Sires has reviewed the case also  She was agreeable of this She was also instructed if any worsening of symptoms tonight to go to ER for further eval ( cough/ sob, or fever or any new symptoms) - she voiced understanding of this     Terri/ Tasha/ Shapale-- she will be coming in am : her orders are in the chart and I will leave her biaxin px by the computer in the lab so she can get it at her blood draw, thanks

## 2013-06-27 NOTE — Patient Instructions (Addendum)
Glad you are feeling a little better Lab today Chest xray today  We will call you later with radiology report - and make a plan  Turn your home oxygen up to 3L and let us know if symptoms do not improve further

## 2013-06-28 ENCOUNTER — Other Ambulatory Visit (INDEPENDENT_AMBULATORY_CARE_PROVIDER_SITE_OTHER): Payer: Medicare Other

## 2013-06-28 ENCOUNTER — Telehealth: Payer: Self-pay | Admitting: *Deleted

## 2013-06-28 DIAGNOSIS — R0989 Other specified symptoms and signs involving the circulatory and respiratory systems: Secondary | ICD-10-CM

## 2013-06-28 DIAGNOSIS — R0600 Dyspnea, unspecified: Secondary | ICD-10-CM

## 2013-06-28 DIAGNOSIS — R0609 Other forms of dyspnea: Secondary | ICD-10-CM

## 2013-06-28 DIAGNOSIS — R918 Other nonspecific abnormal finding of lung field: Secondary | ICD-10-CM

## 2013-06-28 DIAGNOSIS — R9389 Abnormal findings on diagnostic imaging of other specified body structures: Secondary | ICD-10-CM

## 2013-06-28 DIAGNOSIS — R059 Cough, unspecified: Secondary | ICD-10-CM

## 2013-06-28 DIAGNOSIS — R05 Cough: Secondary | ICD-10-CM

## 2013-06-28 DIAGNOSIS — N318 Other neuromuscular dysfunction of bladder: Secondary | ICD-10-CM

## 2013-06-28 DIAGNOSIS — R0902 Hypoxemia: Secondary | ICD-10-CM

## 2013-06-28 LAB — POCT URINALYSIS DIPSTICK
Blood, UA: NEGATIVE
Nitrite, UA: NEGATIVE
Urobilinogen, UA: 0.2
pH, UA: 6

## 2013-06-28 LAB — SEDIMENTATION RATE: Sed Rate: 48 mm/hr — ABNORMAL HIGH (ref 0–22)

## 2013-06-28 NOTE — Telephone Encounter (Signed)
Spoke with the pt  She states that she is feeling better No more fever She does c/o cough- taking hycodan for this  Will keep appts for PET scan and rov with MW for 05/03/13, and call sooner if needed

## 2013-06-28 NOTE — Telephone Encounter (Signed)
Message copied by Christen Butter on Wed Jun 28, 2013  1:38 PM ------      Message from: Sandrea Hughs B      Created: Wed Jun 28, 2013  7:32 AM       Call and see if feeling better and if not see if she'll come in with all meds in hand asap ------

## 2013-06-28 NOTE — Telephone Encounter (Signed)
Pt picked up Rx and gave a UA when she came for labs

## 2013-06-29 LAB — ANCA SCREEN W REFLEX TITER: Atypical p-ANCA Screen: NEGATIVE

## 2013-06-29 LAB — ANGIOTENSIN CONVERTING ENZYME: Angiotensin-Converting Enzyme: 31 U/L (ref 8–52)

## 2013-06-30 ENCOUNTER — Telehealth: Payer: Self-pay | Admitting: Internal Medicine

## 2013-06-30 NOTE — Telephone Encounter (Signed)
Called and spoke with pts husband and he is aware of MW recs and they will keep the appt on Monday for the PET.

## 2013-06-30 NOTE — Telephone Encounter (Signed)
Called and spoke with pts husband and he stated that the pt is scheudled for PET scan on Monday.  He wanted to make sure that they should still go on Monday.  He stated that the pt still has the acute bronchitis and everytime she goes to take a deep breath she starts with a coughing spell.  He wanted to make sure this will not affect her doing the PET scan.  MW please advise. Thanks  Allergies  Allergen Reactions  . Shellfish Allergy Anaphylaxis  . Aspirin     REACTION: nausea and vomiting  . Atorvastatin     REACTION: leg pain  . Crestor [Rosuvastatin Calcium] Other (See Comments)    Muscle pain - allergy/intolerance  . Fish Allergy   . Simvastatin     REACTION: muscle pain  . Trandolapril     REACTION: leg pain

## 2013-06-30 NOTE — Telephone Encounter (Signed)
No we really need this to figure out the next step and it is very hard to schedule/ can't be done as inpt so def keep appt and use the cough syrup immediately before.  (ok to refill cough syrup)

## 2013-07-03 ENCOUNTER — Encounter: Payer: Self-pay | Admitting: Internal Medicine

## 2013-07-03 ENCOUNTER — Encounter (HOSPITAL_COMMUNITY): Payer: Self-pay

## 2013-07-03 ENCOUNTER — Ambulatory Visit (INDEPENDENT_AMBULATORY_CARE_PROVIDER_SITE_OTHER): Payer: Medicare Other | Admitting: Internal Medicine

## 2013-07-03 ENCOUNTER — Ambulatory Visit (HOSPITAL_COMMUNITY)
Admission: RE | Admit: 2013-07-03 | Discharge: 2013-07-03 | Disposition: A | Discharge status: Home / Self Care | Payer: Medicare Other | Source: Ambulatory Visit | Attending: Internal Medicine | Admitting: Internal Medicine

## 2013-07-03 VITALS — BP 132/80 | HR 87 | Temp 97.9°F | Ht 65.5 in | Wt 232.0 lb

## 2013-07-03 DIAGNOSIS — R0902 Hypoxemia: Secondary | ICD-10-CM

## 2013-07-03 DIAGNOSIS — R918 Other nonspecific abnormal finding of lung field: Secondary | ICD-10-CM

## 2013-07-03 DIAGNOSIS — J45909 Unspecified asthma, uncomplicated: Secondary | ICD-10-CM

## 2013-07-03 MED ORDER — PREDNISONE (PAK) 10 MG PO TABS: ORAL_TABLET | ORAL | Status: DC

## 2013-07-03 MED ORDER — FLUDEOXYGLUCOSE F - 18 (FDG) INJECTION
18.3000 | Freq: Once | INTRAVENOUS | Status: AC | PRN
  Administered 2013-07-03: 18.3 via INTRAVENOUS

## 2013-07-03 MED ORDER — HYDROCODONE-HOMATROPINE 5-1.5 MG/5ML PO SYRP: 5.0000 mL | ORAL_SOLUTION | Freq: Four times a day (QID) | ORAL | Status: DC | PRN

## 2013-07-03 MED ORDER — MOMETASONE FURO-FORMOTEROL FUM 100-5 MCG/ACT IN AERO: INHALATION_SPRAY | RESPIRATORY_TRACT | Status: DC

## 2013-07-03 NOTE — Assessment & Plan Note (Signed)
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

## 2013-07-03 NOTE — Assessment & Plan Note (Addendum)
11/24/2012 Office walk showed desats 86% on RA  With  spirometry FEV1 nml-73% , ratio 80   11/25/2012 CTA Chest >>neg for PE/ ILD  12/07/12 ONO > Pos 5:  begin O2 2 l/m At bedtime  12/13/2012  >  Repeat 02/01/13 on 2lpm 20min> no change rx  - 01/31/2013  Walked RA x 3 laps @ 185 ft each stopped due to  End of study, no desats  - 07/03/2013 sats 75% RA >  On 3lpm sats 93%  > rx with 24/7   02 at 3lpm  For now  This is a risk for fob but manageable - may limit the number of bx's we can do.

## 2013-07-03 NOTE — Progress Notes (Signed)
Subjective:    Patient ID: Kelli Velasquez, female    DOB: [birth year], 72 y.o.   MRN: [id number]  Brief patient profile:  72 yowf quit smoking in 1984 at wt 160- 180   with h/o CAD, HTN, OA and obesity presents for an initial pulmonary consult for cough x 6 weeks and recurrent bronchitis flares x 1 year ~10/2011 referred by [facility]  PCP : Dr.  Milinda Antis   11/24/2012 Initial pulmonary consult  Complains of chronic cough  For 4-6 weeks .  Referred by Dr Milinda Antis  Reports coughing spells for approx 1 month, began 1 years ago.  This one is the longest w/ occ white mucus prod, occ wheezing.   Denies SOB, head congestion, watery eyes, f/c/s Has been treated with abx without any help.  CXR on 4/3 showed no acute process.  Given cough syrup with tussionex -some help.  Metoprolol was stopped ? Asthma flare d/t nonselective BB effect.  Did not see any improvement in cough, but energy level better off BB.  Cough is day and night, rattling cough but minimally productive. Keeps her up at night.  No fever, night sweats, hemoptysis, wt loss.   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  Started on Prilosec Twice daily  And Zantac At bedtime   Denies significant dyspnea, however can not walk long distances , [place] in airport  Does water aerobics - on reg basis. Unable to go lately due to cough .   Denies orthopnea but sleeping in recliner last few weeks due to cough and wheezing in supine position.  No calf pain , chest pain or hemoptysis No increased leg swelling but has ankle edema chronically  Does have snoring , no sign daytime sleepiness.   5/1  Office walk showed desats 86% on RA .  5/1  spirometry FEV1 nml-73% , ratio 80  rec Late add -instructions emailed to pt  Prednisone taper.  Stop all fish/flax seed oil capsules.  Begin Delsym 2 tsp Twice daily  For cough  Begin Tessalon Three times a day  For cough  May use Hydromet  1 tsp every 6hr as needed for cough. -may make you sleepy.  Begin Chlortrimeton 4mg  1 in am and 2 At bedtime   Continue on Prilosec 20mg  Twice daily   Continue on Zantac At bedtime     12/16/2012 f/u ov/Wert  Chief Complaint  Patient presents with  . Follow-up    Pt states that her cough is much improved since the last visit.   still feels the tickle in throat present x one year Takes chlortrimeton 4mg  1 every am and 2 at hs but most of her symptoms are daytime No need for saba at this point rec Self monitor blood pressure - target is less than 140 over 85 Change chlortrimeton to 4mg   Up to every 4 hours as needed  Stay on acid suppression GERD   01/31/2013 f/u ov/Wert re cough and noct desat Chief Complaint  Patient presents with  . Followup with PFT    Cough continues to improve, but seems to flare up with humid weather  not needing saba at all, still some doe x exertion, not with adls rec No change in your medications Weight control is simply a matter of calorie balance Follow up in November after your CT chest in the first week in November Recheck ono 2lpm and leave on 02 at  hs until wt loss    07/03/2013 f/u ov/Wert re: sob/cough/ wheeze/ MPN Chief Complaint  Patient presents with  . Follow-up    Pt had PET scan done this am. Her cough and SOB have improved since the last visit.   saba inhaler helps some/ min mucoid sputum, temp to 99 but no rigors, no purulent sputum or hemoptysis/ does have some wt. Loss, no sweating.     No obvious daytime variabilty or assoc sob or cp or chest tightness, subjective wheeze overt sinus or hb symptoms. No unusual exp hx or h/o childhood pna/ asthma or premature birth to her knowledge.   Sleeping ok without nocturnal  or early am exacerbation  of respiratory  c/o's or need for noct saba. Also denies any obvious fluctuation of symptoms with weather or environmental changes or other aggravating or alleviating factors except as outlined  above   Current Medications, Allergies, Past Medical History, Past Surgical History, Family History, and Social History were reviewed in [employer] record.  ROS  The following are not active complaints unless bolded sore throat, dysphagia, dental problems, itching, sneezing,  nasal congestion or excess/ purulent secretions, ear ache,   fever, chills, sweats, unintended wt loss, pleuritic or exertional cp, hemoptysis,  orthopnea pnd or leg swelling, presyncope, palpitations, heartburn, abdominal pain, anorexia, nausea, vomiting, diarrhea  or change in bowel or urinary habits, change in stools or urine, dysuria,hematuria,  rash, arthralgias, visual complaints, headache, numbness weakness or ataxia or problems with walking or coordination,  change in mood/affect or memory.      PMH:  -Hx of NSTEMI.07/2009 w/  cardiac cath s/p 2 stents in LAD/ RCA       >>Echo at that time showed Mild LVH, EF of 55% and grade 1 diastolic dysfuntion  -Hyperlipidemia -HTN  -OA -s/p bilat. Knee replacements - Obesity    SH:  Retired from [employer] work Former smoker -quit 1984 -smoked x 20 1 PPD  Married  No pets  No unusual hobbies  Travels on vacation frequently (1-10/2012 -traveled to [place], [place] and [place] )    FH :  Stroke         Objective:   Physical Exam GEN: A/Ox3; pleasant , NAD, obese WF with  nl vital signs  01/31/2013         237  > 07/03/2013  232  Wt Readings from Last 3 Encounters:  12/16/12 246 lb (111.585 kg)  11/24/12 252 lb 9.6 oz (114.579 kg)  11/10/12 245 lb 8 oz (111.358 kg)     HEENT:  [place]/AT,  EACs-clear, TMs-wnl, NOSE-clear, THROAT-clear, no lesions, no postnasal drip or exudate noted.    Edentulous   NECK:  Supple w/ fair ROM; no JVD; normal carotid impulses w/o bruits; no thyromegaly or nodules palpated; no lymphadenopathy.  RESP   insp and exp rhonchi with coarse bs  CARD:  RRR, no m/r/g  , 1+ peripheral edema, pulses intact, no cyanosis or  clubbing., neg homans sign   GI:   Soft & nt; nml bowel sounds; no organomegaly or masses detected.  Musco: Warm bil, no deformities or joint swelling noted.  Bilateral knee scars    CT 11/25/12 1. Negative for pulmonary embolus.  2. Scattered small pulmonary nodules are nonspecific largest 7 mm 3. Borderline enlarged AP window lymph node is nonspecific.  4. Coronary artery calcification.  5. Question hepatic steatosis.  CT Chest 06/15/13  Interval development of widespread nodular airspace consolidation  and ground-glass, seen  in a hematogenous distribution. Findings are  suspicious for septic emboli. Less likely diagnostic considerations  include an infectious process or organizing pneumonia        Assessment & Plan:

## 2013-07-03 NOTE — Patient Instructions (Addendum)
Dulera 100 Take 2 puffs first thing in am and then another 2 puffs about 12 hours later.   Prednisone 10 mg take  4 each am x 2 days,   2 each am x 2 days,  1 each am x 2 days and stop  Only use your albuterol as a rescue medication to be used if you can't catch your breath by resting or doing a relaxed purse lip breathing pattern.  - The less you use it, the better it will work when you need it. - Ok to use up to every 4 hours if you must but call for immediate appointment if use goes up over your usual need - Don't leave home without it !!  (think of it like your spare tire for your car)     We will arrange 24/7 02 at 3lpm per advanced   We will call to arrange a biopsy of the easiest area once I discuss this with radiology  Late add: Discussed in detail all the  indications, usual  risks and alternatives  relative to the benefits with patient who agrees to proceed with bronchoscopy with biopsy.

## 2013-07-03 NOTE — Assessment & Plan Note (Signed)
-   PFT's 01/31/2013 wnl x low fef25-75 and 12% better fev1 p B2 - hfa 75% p coaching 07/03/2013 > start dulera 100 2bid

## 2013-07-04 ENCOUNTER — Telehealth: Payer: Self-pay | Admitting: Internal Medicine

## 2013-07-04 NOTE — Telephone Encounter (Signed)
Kelli Velasquez says she has what she need for pt nothing further needed.Kelli Velasquez

## 2013-07-04 NOTE — Telephone Encounter (Signed)
APS is closed. Will call back in am

## 2013-07-04 NOTE — Telephone Encounter (Signed)
lmomtcb x1 for pt 

## 2013-07-05 ENCOUNTER — Ambulatory Visit (HOSPITAL_COMMUNITY): Payer: Medicare Other

## 2013-07-05 ENCOUNTER — Ambulatory Visit (HOSPITAL_COMMUNITY)
Admission: RE | Admit: 2013-07-05 | Discharge: 2013-07-05 | Disposition: A | Discharge status: Home / Self Care | Payer: Medicare Other | Source: Ambulatory Visit | Attending: Internal Medicine | Admitting: Internal Medicine

## 2013-07-05 ENCOUNTER — Encounter (HOSPITAL_COMMUNITY)
Admission: RE | Disposition: A | Discharge status: Home / Self Care | Payer: Self-pay | Source: Ambulatory Visit | Attending: Internal Medicine

## 2013-07-05 DIAGNOSIS — I252 Old myocardial infarction: Secondary | ICD-10-CM | POA: Insufficient documentation

## 2013-07-05 DIAGNOSIS — R05 Cough: Secondary | ICD-10-CM | POA: Insufficient documentation

## 2013-07-05 DIAGNOSIS — I2510 Atherosclerotic heart disease of native coronary artery without angina pectoris: Secondary | ICD-10-CM | POA: Insufficient documentation

## 2013-07-05 DIAGNOSIS — R0902 Hypoxemia: Secondary | ICD-10-CM | POA: Insufficient documentation

## 2013-07-05 DIAGNOSIS — I10 Essential (primary) hypertension: Secondary | ICD-10-CM | POA: Insufficient documentation

## 2013-07-05 DIAGNOSIS — E785 Hyperlipidemia, unspecified: Secondary | ICD-10-CM | POA: Insufficient documentation

## 2013-07-05 DIAGNOSIS — J45909 Unspecified asthma, uncomplicated: Secondary | ICD-10-CM | POA: Insufficient documentation

## 2013-07-05 DIAGNOSIS — R059 Cough, unspecified: Secondary | ICD-10-CM | POA: Insufficient documentation

## 2013-07-05 DIAGNOSIS — Z96659 Presence of unspecified artificial knee joint: Secondary | ICD-10-CM | POA: Insufficient documentation

## 2013-07-05 DIAGNOSIS — Z87891 Personal history of nicotine dependence: Secondary | ICD-10-CM | POA: Insufficient documentation

## 2013-07-05 DIAGNOSIS — K219 Gastro-esophageal reflux disease without esophagitis: Secondary | ICD-10-CM | POA: Insufficient documentation

## 2013-07-05 DIAGNOSIS — R918 Other nonspecific abnormal finding of lung field: Secondary | ICD-10-CM

## 2013-07-05 DIAGNOSIS — E669 Obesity, unspecified: Secondary | ICD-10-CM | POA: Insufficient documentation

## 2013-07-05 HISTORY — PX: VIDEO BRONCHOSCOPY: SHX5072

## 2013-07-05 LAB — CULTURE, BLOOD (SINGLE): Organism ID, Bacteria: NO GROWTH

## 2013-07-05 SURGERY — BRONCHOSCOPY, WITH FLUOROSCOPY
Anesthesia: Moderate Sedation | Laterality: Bilateral

## 2013-07-05 MED ORDER — PHENYLEPHRINE HCL 0.25 % NA SOLN: 1.0000 | Freq: Four times a day (QID) | NASAL | Status: DC | PRN

## 2013-07-05 MED ORDER — PHENYLEPHRINE HCL 0.25 % NA SOLN
NASAL | Status: DC | PRN
  Administered 2013-07-05: 2 via NASAL

## 2013-07-05 MED ORDER — LIDOCAINE HCL 1 % IJ SOLN
INTRAMUSCULAR | Status: DC | PRN
  Administered 2013-07-05: 6 mL via RESPIRATORY_TRACT

## 2013-07-05 MED ORDER — MEPERIDINE HCL 25 MG/ML IJ SOLN
INTRAMUSCULAR | Status: DC | PRN
  Administered 2013-07-05 (×2): 25 mg via INTRAVENOUS

## 2013-07-05 MED ORDER — MIDAZOLAM HCL 10 MG/2ML IJ SOLN
INTRAMUSCULAR | Status: DC | PRN
  Administered 2013-07-05: 5 mg via INTRAVENOUS

## 2013-07-05 MED ORDER — MEPERIDINE HCL 100 MG/ML IJ SOLN: 100.0000 mg | Freq: Once | INTRAMUSCULAR | Status: DC

## 2013-07-05 MED ORDER — SODIUM CHLORIDE 0.9 % IV SOLN
INTRAVENOUS | Status: DC
  Administered 2013-07-05: 08:00:00 via INTRAVENOUS

## 2013-07-05 MED ORDER — LIDOCAINE HCL 2 % EX GEL
Freq: Once | CUTANEOUS | Status: DC
  Filled 2013-07-05: qty 5

## 2013-07-05 MED ORDER — LIDOCAINE HCL 2 % EX GEL
CUTANEOUS | Status: DC | PRN
  Administered 2013-07-05: 1

## 2013-07-05 MED ORDER — MIDAZOLAM HCL 10 MG/2ML IJ SOLN
INTRAMUSCULAR | Status: AC
  Filled 2013-07-05: qty 4

## 2013-07-05 MED ORDER — MEPERIDINE HCL 100 MG/ML IJ SOLN
INTRAMUSCULAR | Status: AC
  Filled 2013-07-05: qty 2

## 2013-07-05 MED ORDER — MIDAZOLAM HCL 5 MG/ML IJ SOLN: 1.0000 mg | Freq: Once | INTRAMUSCULAR | Status: DC

## 2013-07-05 MED ORDER — MIDAZOLAM HCL 10 MG/2ML IJ SOLN: 1.0000 mg | Freq: Once | INTRAMUSCULAR | Status: DC

## 2013-07-05 NOTE — Progress Notes (Signed)
Video bronchoscopy performed Intervention bronchial washing Intervention bronchial biopsy Pt tolerated well  Jacqulynn Cadet RRT

## 2013-07-05 NOTE — H&P (Signed)
Brief patient profile:   72 yowf quit smoking in 1984 at wt 160- 180   with h/o CAD, HTN, OA and obesity presents for an initial pulmonary consult for cough x 6 weeks and recurrent bronchitis flares x 1 year ~10/2011 referred by [facility] with MPNs all 4 mm or less      11/24/2012 Initial pulmonary consult  Complains of chronic cough  For 4-6 weeks .   Referred by Dr Milinda Antis   Reports coughing spells for approx 1 month, began 1 years ago.  This one is the longest w/ occ white mucus prod, occ wheezing.    Denies SOB, head congestion, watery eyes, f/c/s Has been treated with abx without any help.   CXR on 4/3 showed no acute process.   Given cough syrup with tussionex -some help.   Metoprolol was stopped ? Asthma flare d/t nonselective BB effect.   Did not see any improvement in cough, but energy level better off BB.   Cough is day and night, rattling cough but minimally productive. Keeps her up at night.   No fever, night sweats, hemoptysis, wt loss.    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   Started on Prilosec Twice daily  And Zantac At bedtime    Denies significant dyspnea, however can not walk long distances , [place] in airport   Does water aerobics - on reg basis. Unable to go lately due to cough .    Denies orthopnea but sleeping in recliner last few weeks due to cough and wheezing in supine position.   No calf pain , chest pain or hemoptysis No increased leg swelling but has ankle edema chronically   Does have snoring , no sign daytime sleepiness.    5/1  Office walk showed desats 86% on RA .   5/1  spirometry FEV1 nml-73% , ratio 80   rec Late add -instructions emailed to pt   Prednisone taper.   Stop all fish/flax seed oil capsules.   Begin Delsym 2 tsp Twice daily  For cough   Begin Tessalon Three times a day  For cough   May use Hydromet 1 tsp every 6hr as needed for cough. -may make you  sleepy.   Begin Chlortrimeton 4mg  1 in am and 2 At bedtime    Continue on Prilosec 20mg  Twice daily    Continue on Zantac At bedtime       12/16/2012 f/u ov/Creed Kail   Chief Complaint   Patient presents with   .  Follow-up       Pt states that her cough is much improved since the last visit.     still feels the tickle in throat present x one year Takes chlortrimeton 4mg  1 every am and 2 at hs but most of her symptoms are daytime No need for saba at this point rec Self monitor blood pressure - target is less than 140 over 85 Change chlortrimeton to 4mg   Up to every 4 hours as needed   Stay on acid suppression GERD     01/31/2013 f/u ov/Geoff Dacanay re cough and noct desat Chief Complaint   Patient presents with   .  Followup with PFT       Cough continues to improve, but seems to flare up with humid weather    not needing saba at all, still some doe x exertion, not with adls rec No change in  your medications Weight control is simply a matter of calorie balance Follow up in November after your CT chest in the first week in November Recheck ono 2lpm and leave on 02 at hs until wt loss       07/03/2013 f/u ov/Brenyn Petrey re: sob/cough/ wheeze/ MPN Chief Complaint   Patient presents with   .  Follow-up       Pt had PET scan done this am. Her cough and SOB have improved since the last visit.     saba inhaler helps some/ min mucoid sputum, temp to 99 but no rigors, no purulent sputum or hemoptysis/ does have some wt. Loss, no sweating.   No obvious daytime variabilty or assoc sob or cp or chest tightness, subjective wheeze overt sinus or hb symptoms. No unusual exp hx or h/o childhood pna/ asthma or premature birth to her knowledge.    Sleeping ok without nocturnal  or early am exacerbation  of respiratory  c/o's or need for noct saba. Also denies any obvious fluctuation of symptoms with weather or environmental changes or other aggravating or alleviating factors except as outlined above     Current Medications, Allergies, Past Medical History, Past Surgical History, Family History, and Social History were reviewed in [employer] record.   ROS  The following are not active complaints unless bolded sore throat, dysphagia, dental problems, itching, sneezing,  nasal congestion or excess/ purulent secretions, ear ache,   fever, chills, sweats, unintended wt loss, pleuritic or exertional cp, hemoptysis,  orthopnea pnd or leg swelling, presyncope, palpitations, heartburn, abdominal pain, anorexia, nausea, vomiting, diarrhea  or change in bowel or urinary habits, change in stools or urine, dysuria,hematuria,  rash, arthralgias, visual complaints, headache, numbness weakness or ataxia or problems with walking or coordination,  change in mood/affect or memory.       PMH:  -Hx of NSTEMI.07/2009 w/  cardiac cath s/p 2 stents in LAD/ RCA        >>Echo at that time showed Mild LVH, EF of 55% and grade 1 diastolic dysfuntion   -Hyperlipidemia -HTN   -OA -s/p bilat. Knee replacements - Obesity      SH:   Retired from [employer] work Former smoker -quit 1984 -smoked x 20 1 PPD   Married   No pets   No unusual hobbies   Travels on vacation frequently (1-10/2012 -traveled to [place], [place] and [place] )      FH :   Stroke              Objective:     Physical Exam GEN: A/Ox3; pleasant , NAD, obese WF with  nl vital signs   01/31/2013         237  > 07/03/2013  232   Wt Readings from Last 3 Encounters:   12/16/12  246 lb (111.585 kg)   11/24/12  252 lb 9.6 oz (114.579 kg)   11/10/12  245 lb 8 oz (111.358 kg)       HEENT:  [place]/AT,  EACs-clear, TMs-wnl, NOSE-clear, THROAT-clear, no lesions, no postnasal drip or exudate noted.    Edentulous    NECK:  Supple w/ fair ROM; no JVD; normal carotid impulses w/o bruits; no thyromegaly or nodules palpated; no lymphadenopathy.    RESP   insp and exp rhonchi with coarse bs   CARD:  RRR, no m/r/g  , 1+  peripheral edema, pulses intact, no cyanosis or clubbing., neg homans sign     GI:  Soft & nt; nml bowel sounds; no organomegaly or masses detected.    Musco: Warm bil, no deformities or joint swelling noted.   Bilateral knee scars      CT 11/25/12 1. Negative for pulmonary embolus.   2. Scattered small pulmonary nodules are nonspecific largest 7 mm 3. Borderline enlarged AP window lymph node is nonspecific.   4. Coronary artery calcification.   5. Question hepatic steatosis.   CT Chest 06/15/13   Interval development of widespread nodular airspace consolidation   and ground-glass, seen in a hematogenous distribution. Findings are   suspicious for septic emboli. Less likely diagnostic considerations   include an infectious process or organizing pneumonia             Assessment & Plan:               ASTHMA -    - PFT's 01/31/2013 wnl x low fef25-75 and 12% better fev1 p B2 - hfa 75% p coaching 07/03/2013 > start dulera 100 2bid           Pulmonary nodules       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 > discussed with IR, rec FOB 1st    Discussed in detail all the  indications, usual  risks and alternatives  relative to the benefits with patient who agrees to proceed with bronchoscopy with biopsy.           Hypoxemia -        11/24/2012 Office walk showed desats 86% on RA  With  spirometry FEV1 nml-73% , ratio 80    11/25/2012 CTA Chest >>neg for PE/ ILD   12/07/12 ONO > Pos 5:  begin O2 2 l/m At bedtime  12/13/2012  >  Repeat 02/01/13 on 2lpm 67min> no change rx   - 01/31/2013  Walked RA x 3 laps @ 185 ft each stopped due to  End of study, no desats  - 07/03/2013 sats 75% RA >  On 3lpm sats 93%  > rx with 24 h 02 at 3lpm                  Patient Instructions      Dulera 100 Take 2 puffs first thing in am and then another 2 puffs about 12 hours later.     Prednisone 10 mg take  4 each am x 2 days,   2 each am x 2 days,  1 each am x 2 days and stop   Only use your albuterol as a rescue medication to be used if you can't catch your breath by resting or doing a relaxed purse lip breathing pattern.   - The less you use it, the better it will work when you need it. - Ok to use up to every 4 hours if you must but call for immediate appointment if use goes up over your usual need - Don't leave home without it !!  (think of it like your spare tire for your car)       We will arrange 24/7 02 at 3lpm per advanced       .    07/05/2013 FOB day no change in hx or exam   Sandrea Hughs, MD Pulmonary and Critical Care Medicine [facility] Cell [phone] After 5:30 PM or weekends, call [phone]

## 2013-07-05 NOTE — Telephone Encounter (Signed)
I called and spoke with kim. Pt is with [facility]. She reports the pt spouse just called her this am and told her. Nothing further needed

## 2013-07-05 NOTE — Op Note (Signed)
Bronchoscopy Procedure Note  Date of Operation: 07/05/2013   Pre-op Diagnosis: lung nodules ? etiology  Post-op Diagnosis: same  Surgeon: Sandrea Hughs  Anesthesia: Monitored Local Anesthesia with Sedation  Operation: Video Flexible fiberoptic bronchoscopy, diagnostic   Findings: nl airways  Specimen: lavage RLL/ Tbbx RLL  Estimated Blood Loss: min   Complications: none  Indications and History: See updated H and P same date. The risks, benefits, complications, treatment options and expected outcomes were discussed with the patient.  The possibilities of reaction to medication, pulmonary aspiration, perforation of a viscus, bleeding, failure to diagnose a condition and creating a complication requiring transfusion or operation were discussed with the patient who freely signed the consent.    Description of Procedure: The patient was re-examined in the bronchoscopy suite and the site of surgery properly noted/marked.  The patient was identified  and the procedure verified as Flexible Fiberoptic Bronchoscopy.  A Time Out was held and the above information confirmed.   After the induction of topical nasopharyngeal anesthesia, the patient was positioned  and the bronchoscope was passed through the L naris. The vocal cords were visualized and  1% buffered lidocaine 5 ml was topically placed onto the cords. The cords were nl. The scope was then passed into the trachea.  1% buffered lidocaine given topically. Airways inspected bilaterally to the subsegmental level with the following findings:  1) all airways widely patent to the subsegmental level with no inflammatory changes or excess/ purulent or bloody secretions  Procedures 1) BAL RLL for cyt afb and fungal stains and cultures 2) TBBX x 4 RLL by fluoro/min bleeding after 4th but because already 02 dep RF at baseline terminated procedure at that point      Patient was recovered in bronch room due to risk of TB. Results discussed  with her husband   Attestation: I performed the procedure.  Sandrea Hughs, MD Pulmonary and Critical Care Medicine [facility] Cell [phone] After 5:30 PM or weekends, call [phone]

## 2013-07-06 ENCOUNTER — Encounter (HOSPITAL_COMMUNITY): Payer: Self-pay | Admitting: Internal Medicine

## 2013-07-07 ENCOUNTER — Encounter: Payer: Self-pay | Admitting: Internal Medicine

## 2013-07-07 ENCOUNTER — Other Ambulatory Visit: Payer: Self-pay | Admitting: Radiology

## 2013-07-07 ENCOUNTER — Other Ambulatory Visit: Payer: Self-pay | Admitting: Internal Medicine

## 2013-07-07 DIAGNOSIS — R918 Other nonspecific abnormal finding of lung field: Secondary | ICD-10-CM

## 2013-07-11 ENCOUNTER — Encounter (HOSPITAL_COMMUNITY): Payer: Self-pay

## 2013-07-11 ENCOUNTER — Ambulatory Visit (HOSPITAL_COMMUNITY)
Admission: RE | Admit: 2013-07-11 | Discharge: 2013-07-11 | Disposition: A | Discharge status: Home / Self Care | Payer: Medicare Other | Source: Ambulatory Visit | Attending: Internal Medicine | Admitting: Internal Medicine

## 2013-07-11 ENCOUNTER — Ambulatory Visit (HOSPITAL_COMMUNITY)
Admission: RE | Admit: 2013-07-11 | Discharge: 2013-07-11 | Disposition: A | Discharge status: Home / Self Care | Payer: Medicare Other | Source: Ambulatory Visit | Attending: Interventional Radiology | Admitting: Interventional Radiology

## 2013-07-11 ENCOUNTER — Encounter: Payer: Self-pay | Admitting: Internal Medicine

## 2013-07-11 VITALS — BP 129/85 | HR 94 | Temp 98.0°F | Resp 20 | Ht 65.5 in | Wt 228.0 lb

## 2013-07-11 DIAGNOSIS — J45909 Unspecified asthma, uncomplicated: Secondary | ICD-10-CM | POA: Insufficient documentation

## 2013-07-11 DIAGNOSIS — M1990 Unspecified osteoarthritis, unspecified site: Secondary | ICD-10-CM | POA: Insufficient documentation

## 2013-07-11 DIAGNOSIS — Z01812 Encounter for preprocedural laboratory examination: Secondary | ICD-10-CM | POA: Insufficient documentation

## 2013-07-11 DIAGNOSIS — J189 Pneumonia, unspecified organism: Secondary | ICD-10-CM | POA: Insufficient documentation

## 2013-07-11 DIAGNOSIS — E669 Obesity, unspecified: Secondary | ICD-10-CM | POA: Insufficient documentation

## 2013-07-11 DIAGNOSIS — E785 Hyperlipidemia, unspecified: Secondary | ICD-10-CM | POA: Insufficient documentation

## 2013-07-11 DIAGNOSIS — K8020 Calculus of gallbladder without cholecystitis without obstruction: Secondary | ICD-10-CM | POA: Insufficient documentation

## 2013-07-11 DIAGNOSIS — I2510 Atherosclerotic heart disease of native coronary artery without angina pectoris: Secondary | ICD-10-CM | POA: Insufficient documentation

## 2013-07-11 DIAGNOSIS — N318 Other neuromuscular dysfunction of bladder: Secondary | ICD-10-CM | POA: Insufficient documentation

## 2013-07-11 DIAGNOSIS — K219 Gastro-esophageal reflux disease without esophagitis: Secondary | ICD-10-CM | POA: Insufficient documentation

## 2013-07-11 DIAGNOSIS — J8410 Pulmonary fibrosis, unspecified: Secondary | ICD-10-CM | POA: Insufficient documentation

## 2013-07-11 DIAGNOSIS — I10 Essential (primary) hypertension: Secondary | ICD-10-CM | POA: Insufficient documentation

## 2013-07-11 DIAGNOSIS — R918 Other nonspecific abnormal finding of lung field: Secondary | ICD-10-CM

## 2013-07-11 DIAGNOSIS — I252 Old myocardial infarction: Secondary | ICD-10-CM | POA: Insufficient documentation

## 2013-07-11 DIAGNOSIS — R911 Solitary pulmonary nodule: Secondary | ICD-10-CM

## 2013-07-11 LAB — CBC
HCT: 44.3 % (ref 36.0–46.0)
Hemoglobin: 14.3 g/dL (ref 12.0–15.0)
MCH: 31.2 pg (ref 26.0–34.0)
MCHC: 32.3 g/dL (ref 30.0–36.0)
MCV: 96.7 fL (ref 78.0–100.0)
Platelets: 238 10*3/uL (ref 150–400)
RDW: 15.9 % — ABNORMAL HIGH (ref 11.5–15.5)

## 2013-07-11 LAB — PROTIME-INR: INR: 1 (ref 0.00–1.49)

## 2013-07-11 LAB — APTT: aPTT: 29 seconds (ref 24–37)

## 2013-07-11 MED ORDER — LIDOCAINE HCL 1 % IJ SOLN
INTRAMUSCULAR | Status: AC
  Filled 2013-07-11: qty 10

## 2013-07-11 MED ORDER — MIDAZOLAM HCL 2 MG/2ML IJ SOLN
INTRAMUSCULAR | Status: AC
  Filled 2013-07-11: qty 4

## 2013-07-11 MED ORDER — MIDAZOLAM HCL 2 MG/2ML IJ SOLN
INTRAMUSCULAR | Status: AC | PRN
  Administered 2013-07-11: 0.5 mg via INTRAVENOUS
  Administered 2013-07-11: 1 mg via INTRAVENOUS

## 2013-07-11 MED ORDER — FENTANYL CITRATE 0.05 MG/ML IJ SOLN
INTRAMUSCULAR | Status: AC | PRN
  Administered 2013-07-11 (×2): 25 ug via INTRAVENOUS

## 2013-07-11 MED ORDER — FENTANYL CITRATE 0.05 MG/ML IJ SOLN
INTRAMUSCULAR | Status: AC
  Filled 2013-07-11: qty 2

## 2013-07-11 MED ORDER — SODIUM CHLORIDE 0.9 % IV SOLN: INTRAVENOUS | Status: DC

## 2013-07-11 NOTE — H&P (Signed)
Kelli Velasquez is an 72 y.o. female.   Chief Complaint: Pt with many months of productive cough Persistent even after treatment with antibiotics- several courses CT 11/2012 revealed B pulm nodules in lower lobes Follow up CT 05/2013 again shows these nodules +PET 06/2013 bronchoscopy with Dr Sherene Sires 07/05/2013: non diagnostic- only 2 samples taken from RLL- bleeding Scheduled now for needle biopsy of same area  HPI: CAD/MI; HTN; HLD; COPD- uses O2 at night; obese  Past Medical History  Diagnosis Date  . Myocardial infarction     subendocardial, initial episode  . Hyperlipidemia   . Hypertension   . Obesity   . Neoplasm of skin     neoplasm of uncertain behavior of skin  . Vertigo   . Myopia   . Overactive bladder   . Urinary incontinence   . Osteoarthritis   . Asthma   . Allergy     allergic rhinitis  . Rash     and other non specific skin eruptions  . Gallstones   . Coronary artery disease     cath January 2011 with DEs LAD and RCA  . GERD (gastroesophageal reflux disease)   . Nocturnal oxygen desaturation     o2 at night  . Wears glasses   . Full dentures     Past Surgical History  Procedure Laterality Date  . Total knee arthroplasty  2010    left , then vocal cord infection post op  . Vocal cord polypectomy    . Cholecystectomy  89  . Joint replacement  2007    right total knee replacement  . Dilation and curettage of uterus  1984  . Colonoscopy    . Coronary angioplasty with stent placement  07/2009    stent  . Incisional hernia repair N/A 05/16/2013    Procedure: HERNIA REPAIR INFRAUMILICAL INCISIONAL;  Surgeon: Clovis Pu. Cornett, MD;  Location: [facility];  Service: General;  Laterality: N/A;  umbilical  . Insertion of mesh N/A 05/16/2013    Procedure: INSERTION OF MESH;  Surgeon: Clovis Pu. Cornett, MD;  Location: [facility];  Service: General;  Laterality: N/A;  umbilical  . Video bronchoscopy Bilateral 07/05/2013    Procedure:  VIDEO BRONCHOSCOPY WITH FLUORO;  Surgeon: Nyoka Cowden, MD;  Location: [facility];  Service: Cardiopulmonary;  Laterality: Bilateral;    Family History  Problem Relation Age of Onset  . Stroke Mother   . Stroke Father   . Colon cancer Neg Hx   . Breast cancer Maternal Grandmother   . Diabetes Mother 4   Social History:  reports that she quit smoking about 30 years ago. Her smoking use included Cigarettes. She has a 25 pack-year smoking history. She has never used smokeless tobacco. She reports that she drinks alcohol. She reports that she does not use illicit drugs.  Allergies:  Allergies  Allergen Reactions  . Shellfish Allergy Anaphylaxis  . Aspirin     REACTION: nausea and vomiting  . Atorvastatin     REACTION: leg pain  . Crestor [Rosuvastatin Calcium] Other (See Comments)    Muscle pain - allergy/intolerance  . Fish Allergy   . Simvastatin     REACTION: muscle pain  . Trandolapril     REACTION: leg pain     (Not in a hospital admission)  Results for orders placed during the hospital encounter of 07/11/13 (from the past 48 hour(s))  APTT     Status: None   Collection Time  07/11/13  7:54 AM      Result Value Range   aPTT 29  24 - 37 seconds  CBC     Status: Abnormal   Collection Time    07/11/13  7:54 AM      Result Value Range   WBC 10.9 (*) 4.0 - 10.5 K/uL   RBC 4.58  3.87 - 5.11 MIL/uL   Hemoglobin 14.3  12.0 - 15.0 g/dL   HCT 16.1  09.6 - 04.5 %   MCV 96.7  78.0 - 100.0 fL   MCH 31.2  26.0 - 34.0 pg   MCHC 32.3  30.0 - 36.0 g/dL   RDW 40.9 (*) 81.1 - 91.4 %   Platelets 238  150 - 400 K/uL  PROTIME-INR     Status: None   Collection Time    07/11/13  7:54 AM      Result Value Range   Prothrombin Time 13.0  11.6 - 15.2 seconds   INR 1.00  0.00 - 1.49   No results found.  Review of Systems  Constitutional: Negative for fever and weight loss.  Respiratory: Positive for cough, sputum production, shortness of breath and wheezing.    Cardiovascular: Negative for chest pain.  Gastrointestinal: Negative for nausea, vomiting and abdominal pain.  Neurological: Positive for weakness. Negative for dizziness.    Blood pressure 156/69, pulse 94, temperature 97.9 F (36.6 C), temperature source Oral, resp. rate 18, height 5' 5.5" (1.664 m), weight 228 lb (103.42 kg), last menstrual period 07/27/1980, SpO2 94.00%. Physical Exam  Constitutional: She is oriented to person, place, and time. She appears well-developed and well-nourished.  Cardiovascular: Normal rate, regular rhythm and normal heart sounds.   No murmur heard. Respiratory: Effort normal and breath sounds normal. She has no wheezes.  Productive cough  GI: Soft. Bowel sounds are normal. There is no tenderness.  Musculoskeletal: Normal range of motion.  Neurological: She is alert and oriented to person, place, and time.  Psychiatric: She has a normal mood and affect. Her behavior is normal. Judgment and thought content normal.     Assessment/Plan B lower lobe pulmonary nodules Bronch RLL 06/2013 insufficient Scheduled now for needle biopsy of same lesions Pt and family aware of procedure benefits and risks and agreeable to proceed Consent signed and in chart  Jaymison Luber A 07/11/2013, 8:39 AM

## 2013-07-11 NOTE — Procedures (Signed)
Successful LLL PULMONARY NODULE 18G CORE BX NO COMP STABLE FULL REPORT IN PACS PATH PENDING

## 2013-07-13 ENCOUNTER — Other Ambulatory Visit: Payer: Self-pay | Admitting: Internal Medicine

## 2013-07-13 DIAGNOSIS — R918 Other nonspecific abnormal finding of lung field: Secondary | ICD-10-CM

## 2013-07-13 MED ORDER — PREDNISONE (PAK) 10 MG PO TABS: ORAL_TABLET | ORAL | Status: DC

## 2013-07-14 ENCOUNTER — Telehealth: Payer: Self-pay | Admitting: Internal Medicine

## 2013-07-14 ENCOUNTER — Telehealth: Payer: Self-pay | Admitting: *Deleted

## 2013-07-14 NOTE — Telephone Encounter (Signed)
Pt called back & resched her 07/17/13 appt w/ CXR to 07/24/13 @ 3:15.  Pt wanted to know why the CXR, I explained per earlier message he was wanting to get a baseline prior to pt going to [facility].  Pt verbalized understanding & states nothing further needed at this time.  Antionette Fairy

## 2013-07-14 NOTE — Telephone Encounter (Signed)
Pt returned call.  Set an appt w/ MW 07/17/13 @ 2:45 PM.  Pt said she was unavailable 12/23 or later.    Kelli Velasquez

## 2013-07-14 NOTE — Telephone Encounter (Signed)
Called home number and pt unavailable  LMTCB on her cell

## 2013-07-14 NOTE — Telephone Encounter (Signed)
Message copied by Christen Butter on Fri Jul 14, 2013  2:27 PM ------      Message from: Kelli Velasquez      Created: Thu Jul 13, 2013  5:09 PM       See if we can get pt back to office for a baseline cxr the week of 12/29 before she goes to [place] ------

## 2013-07-17 ENCOUNTER — Encounter (HOSPITAL_COMMUNITY): Payer: Self-pay

## 2013-07-17 ENCOUNTER — Ambulatory Visit: Payer: Medicare Other | Admitting: Internal Medicine

## 2013-07-19 ENCOUNTER — Encounter: Payer: Self-pay | Admitting: Internal Medicine

## 2013-07-24 ENCOUNTER — Encounter: Payer: Self-pay | Admitting: Internal Medicine

## 2013-07-24 ENCOUNTER — Ambulatory Visit (INDEPENDENT_AMBULATORY_CARE_PROVIDER_SITE_OTHER)
Admission: RE | Admit: 2013-07-24 | Discharge: 2013-07-24 | Disposition: A | Discharge status: Home / Self Care | Payer: Medicare Other | Source: Ambulatory Visit | Attending: Internal Medicine | Admitting: Internal Medicine

## 2013-07-24 ENCOUNTER — Ambulatory Visit (INDEPENDENT_AMBULATORY_CARE_PROVIDER_SITE_OTHER): Payer: Medicare Other | Admitting: Internal Medicine

## 2013-07-24 ENCOUNTER — Other Ambulatory Visit (INDEPENDENT_AMBULATORY_CARE_PROVIDER_SITE_OTHER): Payer: Medicare Other

## 2013-07-24 VITALS — BP 116/70 | HR 55 | Temp 97.7°F | Ht 65.5 in | Wt 231.2 lb

## 2013-07-24 DIAGNOSIS — J9610 Chronic respiratory failure, unspecified whether with hypoxia or hypercapnia: Secondary | ICD-10-CM

## 2013-07-24 DIAGNOSIS — R918 Other nonspecific abnormal finding of lung field: Secondary | ICD-10-CM

## 2013-07-24 LAB — CBC WITH DIFFERENTIAL/PLATELET
Basophils Relative: 0.6 % (ref 0.0–3.0)
Eosinophils Absolute: 0.4 10*3/uL (ref 0.0–0.7)
HCT: 39.7 % (ref 36.0–46.0)
Hemoglobin: 13.4 g/dL (ref 12.0–15.0)
Lymphocytes Relative: 21.2 % (ref 12.0–46.0)
Lymphs Abs: 1.8 10*3/uL (ref 0.7–4.0)
MCHC: 33.8 g/dL (ref 30.0–36.0)
Monocytes Relative: 7.6 % (ref 3.0–12.0)
Neutro Abs: 5.5 10*3/uL (ref 1.4–7.7)
Neutrophils Relative %: 65.8 % (ref 43.0–77.0)
RBC: 4.41 Mil/uL (ref 3.87–5.11)

## 2013-07-24 LAB — SEDIMENTATION RATE: Sed Rate: 42 mm/hr — ABNORMAL HIGH (ref 0–22)

## 2013-07-24 MED ORDER — MOMETASONE FURO-FORMOTEROL FUM 100-5 MCG/ACT IN AERO: INHALATION_SPRAY | RESPIRATORY_TRACT | Status: DC

## 2013-07-24 NOTE — Patient Instructions (Addendum)
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

## 2013-07-24 NOTE — Progress Notes (Signed)
Subjective:    Patient ID: Kelli Velasquez, female    DOB: January 21, 1941, 72 y.o.   MRN: [id number]  Brief patient profile:  72 yowf quit smoking in 1984 at wt 160- 180   with h/o CAD, HTN, OA and obesity presents for an initial pulmonary consult for cough x 6 weeks and recurrent bronchitis flares x 1 year ~10/2011 referred by [facility]  PCP : Dr.  Milinda Antis   11/24/2012 Initial pulmonary consult  Complains of chronic cough  For 4-6 weeks .  Referred by Dr Milinda Antis  Reports coughing spells for approx 1 month, began 1 years ago.  This one is the longest w/ occ white mucus prod, occ wheezing.   Denies SOB, head congestion, watery eyes, f/c/s Has been treated with abx without any help.  CXR on 4/3 showed no acute process.  Given cough syrup with tussionex -some help.  Metoprolol was stopped ? Asthma flare d/t nonselective BB effect.  Did not see any improvement in cough, but energy level better off BB.  Cough is day and night, rattling cough but minimally productive. Keeps her up at night.  No fever, night sweats, hemoptysis, wt loss.   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  Started on Prilosec Twice daily  And Zantac At bedtime   Denies significant dyspnea, however can not walk long distances , [place] wheelchair in airport  Does water aerobics - on reg basis. Unable to go lately due to cough .   Denies orthopnea but sleeping in recliner last few weeks due to cough and wheezing in supine position.  No calf pain , chest pain or hemoptysis No increased leg swelling but has ankle edema chronically  Does have snoring , no sign daytime sleepiness.   5/1  Office walk showed desats 86% on RA .  5/1  spirometry FEV1 nml-73% , ratio 80  rec Late add -instructions emailed to pt  Prednisone taper.  Stop all fish/flax seed oil capsules.  Begin Delsym 2 tsp Twice daily  For cough  Begin Tessalon Three times a day  For cough  May use Hydromet  1 tsp every 6hr as needed for cough. -may make you sleepy.  Begin Chlortrimeton 4mg  1 in am and 2 At bedtime   Continue on Prilosec 20mg  Twice daily   Continue on Zantac At bedtime     12/16/2012 f/u ov/Kelli Velasquez  Chief Complaint  Patient presents with  . Follow-up    Pt states that her cough is much improved since the last visit.   still feels the tickle in throat present x one year Takes chlortrimeton 4mg  1 every am and 2 at hs but most of her symptoms are daytime No need for saba at this point rec Self monitor blood pressure - target is less than 140 over 85 Change chlortrimeton to 4mg   Up to every 4 hours as needed  Stay on acid suppression GERD   01/31/2013 f/u ov/Kelli Velasquez re cough and noct desat Chief Complaint  Patient presents with  . Followup with PFT    Cough continues to improve, but seems to flare up with humid weather  not needing saba at all, still some doe x exertion, not with adls rec No change in your medications Weight control is simply a matter of calorie balance Follow up in November after your CT chest in the first week in November Recheck ono 2lpm and leave on 02 at  hs until wt loss    07/03/2013 f/u ov/Kelli Velasquez re: sob/cough/ wheeze/ MPN Chief Complaint  Patient presents with  . Follow-up    Pt had PET scan done this am. Her cough and SOB have improved since the last visit.   saba inhaler helps some/ min mucoid sputum, temp to 99 but no rigors, no purulent sputum or hemoptysis/ does have some wt. Loss, no sweating.  rec Dulera 100 Take 2 puffs first thing in am and then another 2 puffs about 12 hours later.  Prednisone 10 mg take  4 each am x 2 days,   2 each am x 2 days,  1 each am x 2 days and stop Only use your albuterol as a rescue medication  We will arrange 24/7 02 at 3lpm per advanced  We will call to arrange a biopsy of the easiest area once I discuss this with radiology  Late add: Discussed in detail all the  indications, usual  risks and alternatives   relative to the benefits with patient who agrees to proceed with bronchoscopy with biopsy.   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 Followup with CXR    Pt states that her breathing is overall doing well. She states that she is able to more activity and feels stronger.   02 2lpm when ambulatory sats in mid 90's last dose of prednisone sev weeks ago really helped sob and no worse since stopped.  No h/o arthralgias, rash, pet exposure.   No obvious daytime variabilty or assoc cough or cp or chest tightness, subjective wheeze overt sinus or hb symptoms. No unusual exp hx or h/o childhood pna/ asthma or premature birth to her knowledge.   Sleeping ok without nocturnal  or early am exacerbation  of respiratory  c/o's or need for noct saba. Also denies any obvious fluctuation of symptoms with weather or environmental changes or other aggravating or alleviating factors except as outlined above   Current Medications, Allergies, Past Medical History, Past Surgical History, Family History, and Social History were reviewed in [employer] record.  ROS  The following are not active complaints unless bolded sore throat, dysphagia, dental problems, itching, sneezing,  nasal congestion or excess/ purulent secretions, ear ache,   fever, chills, sweats, unintended wt loss, pleuritic or exertional cp, hemoptysis,  orthopnea pnd or leg swelling, presyncope, palpitations, heartburn, abdominal pain, anorexia, nausea, vomiting, diarrhea  or change in bowel or urinary habits, change in  stools or urine, dysuria,hematuria,  rash, arthralgias, visual complaints, headache, numbness weakness or ataxia or problems with walking or coordination,  change in mood/affect or memory.      PMH:  -Hx of NSTEMI.07/2009 w/  cardiac cath s/p 2 stents in LAD/ RCA       >>Echo at that time showed Mild LVH, EF of 55% and grade 1 diastolic dysfuntion  -Hyperlipidemia -HTN  -OA -s/p bilat. Knee replacements - Obesity    SH:  Retired from [employer] work Former smoker -quit 1984 -smoked x 20 1 PPD  Married  No pets  No unusual hobbies  Travels on vacation frequently (1-10/2012 -traveled to [place], [place] and [place] )    FH :  Stroke         Objective:   Physical Exam GEN: A/Ox3; pleasant , NAD, obese WF with  nl vital signs  01/31/2013         237  > 07/03/2013  232 > 07/24/2013  231  Wt Readings from Last 3 Encounters:  12/16/12 246 lb (111.585 kg)  11/24/12 252 lb 9.6 oz (114.579 kg)  11/10/12 245 lb 8 oz (111.358 kg)     HEENT:  [place]/AT,  EACs-clear, TMs-wnl, NOSE-clear, THROAT-clear, no lesions, no postnasal drip or exudate noted.    Edentulous   NECK:  Supple w/ fair ROM; no JVD; normal carotid impulses w/o bruits; no thyromegaly or nodules palpated; no lymphadenopathy.  RESP   insp and exp rhonchi with coarse bs  CARD:  RRR, no m/r/g  , 1+ peripheral edema, pulses intact, no cyanosis or clubbing., neg homans sign   GI:   Soft & nt; nml bowel sounds; no organomegaly or masses detected.  Musco: Warm bil, no deformities or joint swelling noted.  Bilateral knee scars      CT Chest 06/15/13  Interval development of widespread nodular airspace consolidation  and ground-glass, seen in a hematogenous distribution. Findings are  suspicious for septic emboli. Less likely diagnostic considerations  include an infectious process or organizing pneumonia    CXR  07/24/2013 : No change from the prior exam. ESR 42 (down from 48)         Assessment & Plan:

## 2013-07-25 ENCOUNTER — Encounter: Payer: Self-pay | Admitting: Internal Medicine

## 2013-07-25 NOTE — Progress Notes (Signed)
Quick Note:  Spoke with pt and notified of results per Dr. Wert. Pt verbalized understanding and denied any questions.  ______ 

## 2013-07-25 NOTE — Assessment & Plan Note (Addendum)
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 - 07/19/2013 notified pt and will return 07/24/13 for cxr/ esr then consider steroid rx (needs crypto serology also) - 07/24/2013 ESR 42 off steroids down from 48 prev  Discussed in detail all the  indications, usual  risks and alternatives  relative to the benefits with patient who agrees to proceed with watchful waiting - I still don't feel 100% confident in this dx s vats bx but pt declined this option so I gave her pred x 6 days for any exacerbation while if [place] and will regroup when returns  In meantime sent crypto serology which is not very sensitive in a non-aids setting but better than nothing.   See instructions for specific recommendations which were reviewed directly with the patient who was given a copy with highlighter outlining the key components.

## 2013-07-25 NOTE — Assessment & Plan Note (Signed)
11/24/2012 Office walk showed desats 86% on RA  With  spirometry FEV1 nml-73% , ratio 80   11/25/2012 CTA Chest >>neg for PE/ ILD  12/07/12 ONO > Pos 5:  begin O2 2 l/m At bedtime  12/13/2012  >  Repeat 02/01/13 on 2lpm 61min> no change rx  - 01/31/2013  Walked RA x 3 laps @ 185 ft each stopped due to  End of study, no desats  - 07/03/2013 sats 75% RA >  On 3lpm sats 93%  > rx with 24 h 02 at 3lpm   - improved by self monitoring as of 07/24/13 ov so changed rx to 2lpm/24/7 humidified due to nasal dryness

## 2013-07-31 LAB — FUNGUS CULTURE W SMEAR
Fungal Smear: NONE SEEN
Special Requests: NORMAL

## 2013-08-17 LAB — AFB CULTURE WITH SMEAR (NOT AT ARMC)
Acid Fast Smear: NONE SEEN
Special Requests: NORMAL

## 2013-09-06 ENCOUNTER — Encounter: Payer: Self-pay | Admitting: Internal Medicine

## 2013-09-06 ENCOUNTER — Ambulatory Visit (INDEPENDENT_AMBULATORY_CARE_PROVIDER_SITE_OTHER): Payer: Medicare HMO | Admitting: Internal Medicine

## 2013-09-06 ENCOUNTER — Ambulatory Visit: Payer: Commercial Managed Care - HMO

## 2013-09-06 ENCOUNTER — Telehealth: Payer: Self-pay | Admitting: Family Medicine

## 2013-09-06 ENCOUNTER — Ambulatory Visit (INDEPENDENT_AMBULATORY_CARE_PROVIDER_SITE_OTHER)
Admission: RE | Admit: 2013-09-06 | Discharge: 2013-09-06 | Disposition: A | Discharge status: Home / Self Care | Payer: Medicare HMO | Source: Ambulatory Visit | Attending: Internal Medicine | Admitting: Internal Medicine

## 2013-09-06 VITALS — BP 124/82 | HR 88 | Temp 97.7°F | Ht 65.5 in | Wt 233.0 lb

## 2013-09-06 DIAGNOSIS — J9610 Chronic respiratory failure, unspecified whether with hypoxia or hypercapnia: Secondary | ICD-10-CM

## 2013-09-06 DIAGNOSIS — R918 Other nonspecific abnormal finding of lung field: Secondary | ICD-10-CM

## 2013-09-06 DIAGNOSIS — J45909 Unspecified asthma, uncomplicated: Secondary | ICD-10-CM

## 2013-09-06 LAB — SEDIMENTATION RATE: SED RATE: 26 mm/h — AB (ref 0–22)

## 2013-09-06 MED ORDER — PREDNISONE 10 MG PO TABS: ORAL_TABLET | ORAL | Status: DC

## 2013-09-06 NOTE — Assessment & Plan Note (Signed)
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 - 07/19/2013 notified pt and will return 07/24/13 for cxr/ esr then consider steroid rx (needs crypto serology also) - 07/24/2013 ESR 42 off pred x 3 weeks - 07/24/13 > crypto serology neg/ Anti-CCP neg  Whatever she has seems to be very steroid responsive and may not require much prednisone for very long at all  The goal with a chronic steroid dependent illness is always arriving at the lowest effective dose that controls the disease/symptoms and not accepting a set "formula" which is based on statistics or guidelines that don't always take into account patient  variability or the natural hx of the dz in every individual patient, which may well vary over time.  For now therefore I recommend the patient maintain  A ceiling of 20 and a floor of 5 mg and f/u q 6 weeks

## 2013-09-06 NOTE — Assessment & Plan Note (Signed)
11/24/2012 Office walk showed desats 86% on RA  With  spirometry FEV1 nml-73% , ratio 80   11/25/2012 CTA Chest >>neg for PE/ ILD  12/07/12 ONO > Pos 5: 20min  begin O2 2 l/m At bedtime  12/13/2012  >  Repeat 02/01/13 on 2lpm 85min> no change rx  - 01/31/2013  Walked RA x 3 laps @ 185 ft each stopped due to  End of study, no desats  - 07/03/2013 sats 75% RA >  On 3lpm sats 93%  > rx with 24 h 02 at 3lpm   - improved by self monitoring as of 07/24/13 ov so changed rx to 2lpm/24/7 humidified due to nasal dryness - 09/06/2013  Walked 2lpm x  2 laps @ 185 ft each stopped due to legs tired, no desats     rx 2lpm 24/7

## 2013-09-06 NOTE — Telephone Encounter (Signed)
Chauncey Reading from [facility] insurance called and stated that our patient Kelli Velasquez was scheduled for an appointment with Dr. Gaynelle Arabian / [facility] today 09/06/13, but they would not see her because they did not have a referral authorized from PCP (our office)  We completed and DO have an active referral authorization confirmation #[id number] received January 10,2015 for Dr. Gaynelle Arabian that expires on 12/04/13.  Informed Beth, she is going to call [facility].  Spoke with patient and Rosaria Ferries is getting her rescheduled with [facility] / Dr. Gaynelle Arabian ASAP.  [name] understands that we completed referral process.  We did not receive any phone calls from Dr. Arlyn Leak office regarding the [facility] referral.  / lt

## 2013-09-06 NOTE — Progress Notes (Signed)
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 Follow-up    Pt had PET scan done this am. Her cough and SOB have improved since the last visit.   saba inhaler helps some/ min mucoid sputum, temp to 99 but no rigors, no purulent sputum or hemoptysis/ does have some wt. Loss, no sweating.  rec Dulera 100 Take 2 puffs first thing in am and then another 2 puffs about 12 hours later.  Prednisone 10 mg take  4 each am x 2 days,   2 each am x 2 days,  1 each am x 2 days and stop Only use your albuterol as a rescue medication  We will arrange 24/7 02 at 3lpm per advanced  We will call to arrange a biopsy of the easiest area once I discuss this with radiology  Late add: Discussed in detail all the  indications, usual  risks and alternatives  relative to the benefits with patient who agrees to proceed with bronchoscopy with biopsy.   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 Followup with CXR    Pt states that her breathing is overall doing well. She states that she is able to more activity and feels stronger.   02 2lpm when ambulatory sats in mid 90's last dose of prednisone sev weeks ago really  helped sob and no worse since stopped.  No h/o arthralgias, rash, pet exposure. rec If condition worsens take pred x 6 days trial    09/06/2013 f/u ov/Kelli Velasquez re:  02 24/7 since Dec 2014  Chief Complaint  Patient presents with  . Follow-up    Pt states that her breathing is doing well and her cough has pretty much resolved. No new co's today. Has not had to use rescue inhaler.   Finished pred Aug 11 2013 and markedly better then a little worse since few days prior to OV   = more cough white mucus, no fever.  No obvious daytime variabilty or assoc  Or  cp or chest tightness, subjective wheeze overt sinus or hb symptoms. No unusual exp hx or h/o childhood pna/ asthma or premature birth to her knowledge.   Sleeping ok without nocturnal  or early am exacerbation  of respiratory  c/o's or need for noct saba. Also denies any obvious fluctuation of symptoms with weather or environmental changes or other aggravating or alleviating factors except as outlined above   Current Medications, Allergies, Past Medical History, Past Surgical History, Family History, and Social History were reviewed in [employer] record.  ROS  The following are not  active complaints unless bolded sore throat, dysphagia, dental problems, itching, sneezing,  nasal congestion or excess/ purulent secretions, ear ache,   fever, chills, sweats, unintended wt loss, pleuritic or exertional cp, hemoptysis,  orthopnea pnd or leg swelling, presyncope, palpitations, heartburn, abdominal pain, anorexia, nausea, vomiting, diarrhea  or change in bowel or urinary habits, change in stools or urine, dysuria,hematuria,  rash, arthralgias, visual complaints, headache, numbness weakness or ataxia or problems with walking or coordination,  change in mood/affect or memory.      PMH:  -Hx of NSTEMI.07/2009 w/  cardiac cath s/p 2 stents in LAD/ RCA       >>Echo at that time showed Mild LVH, EF of 55% and grade 1 diastolic dysfuntion   -Hyperlipidemia -HTN  -OA -s/p bilat. Knee replacements - Obesity    SH:  Retired from [employer] work Former smoker -quit 1984 -smoked x 20 1 PPD  Married  No pets  No unusual hobbies  [place] on vacation frequently (1-10/2012 -traveled to [place], [place] and [place] )    [place] :  Stroke         Objective:   Physical Exam GEN: A/Ox3; pleasant , NAD, obese WF    01/31/2013         237  > 07/03/2013  232 > 07/24/2013  231 > 09/06/2013  233  Wt Readings from Last 3 Encounters:  12/16/12 246 lb (111.585 kg)  11/24/12 252 lb 9.6 oz (114.579 kg)  11/10/12 245 lb 8 oz (111.358 kg)     HEENT:  [place]/AT,  EACs-clear, TMs-wnl, NOSE-clear, THROAT-clear, no lesions, no postnasal drip or exudate noted.    Edentulous   NECK:  Supple w/ fair ROM; no JVD; normal carotid impulses w/o bruits; no thyromegaly or nodules palpated; no lymphadenopathy.  RESP       CARD:  RRR, no m/r/g  , trace  peripheral edema, pulses intact, no cyanosis or clubbing., neg homans sign   GI:   Soft & nt; nml bowel sounds; no organomegaly or masses detected.  Musco: Warm bil, no deformities or joint swelling noted.  Bilateral knee scars      CT Chest 06/15/13  Interval development of widespread nodular airspace consolidation  and ground-glass, seen in a hematogenous distribution. Findings are  suspicious for septic emboli. Less likely diagnostic considerations  include an infectious process or organizing pneumonia    CXR  09/06/2013 : Improved bilateral lower lung zone patchy areas of airspace opacity. No new abnormalities.  09/06/2013 ESR 26          Assessment & Plan:

## 2013-09-06 NOTE — Patient Instructions (Addendum)
Please remember to go to the lab and x-ray department downstairs for your tests - we will call you with the results when they are available.     Prednisone 20 mg per day until your best then reduce to 10 mg per day x one week then one half daily as tolerated, increase back up if needed.  Not really clear how much asthma present > ok to change dulera to prn    Please schedule a follow up office visit in 6 weeks, call sooner if needed

## 2013-09-06 NOTE — Assessment & Plan Note (Signed)
-   PFT's 01/31/2013 wnl x low [id number] and 12% better fev1 p B2 - hfa 75% p coaching 07/03/2013 > start dulera 100 2bid > ok to change to prn

## 2013-09-07 NOTE — Progress Notes (Signed)
Quick Note:  Spoke with pt and notified of results per Dr. Wert. Pt verbalized understanding and denied any questions.  ______ 

## 2013-10-02 ENCOUNTER — Telehealth: Payer: Self-pay | Admitting: Internal Medicine

## 2013-10-02 NOTE — Telephone Encounter (Signed)
I called and spoke with pt. She is faxing over [place] and needs this filled out ASAP. She will do this tomorrow. Will send to leslie to let her know.

## 2013-10-12 ENCOUNTER — Ambulatory Visit: Payer: Self-pay | Admitting: Family Medicine

## 2013-10-12 ENCOUNTER — Encounter: Payer: Self-pay | Admitting: Family Medicine

## 2013-10-12 ENCOUNTER — Telehealth: Payer: Self-pay | Admitting: Internal Medicine

## 2013-10-12 NOTE — Telephone Encounter (Signed)
Mailed forms.

## 2013-10-18 ENCOUNTER — Ambulatory Visit (INDEPENDENT_AMBULATORY_CARE_PROVIDER_SITE_OTHER): Payer: Medicare HMO | Admitting: Internal Medicine

## 2013-10-18 ENCOUNTER — Encounter: Payer: Self-pay | Admitting: Internal Medicine

## 2013-10-18 ENCOUNTER — Ambulatory Visit (HOSPITAL_COMMUNITY)
Admission: RE | Admit: 2013-10-18 | Discharge: 2013-10-18 | Disposition: A | Discharge status: Home / Self Care | Payer: Medicare HMO | Source: Ambulatory Visit | Attending: Internal Medicine | Admitting: Internal Medicine

## 2013-10-18 VITALS — BP 118/70 | HR 114 | Temp 98.0°F | Ht 65.5 in | Wt 233.0 lb

## 2013-10-18 DIAGNOSIS — Z8701 Personal history of pneumonia (recurrent): Secondary | ICD-10-CM | POA: Insufficient documentation

## 2013-10-18 DIAGNOSIS — J9610 Chronic respiratory failure, unspecified whether with hypoxia or hypercapnia: Secondary | ICD-10-CM

## 2013-10-18 DIAGNOSIS — IMO0002 Reserved for concepts with insufficient information to code with codable children: Secondary | ICD-10-CM | POA: Insufficient documentation

## 2013-10-18 DIAGNOSIS — R918 Other nonspecific abnormal finding of lung field: Secondary | ICD-10-CM

## 2013-10-18 DIAGNOSIS — Z09 Encounter for follow-up examination after completed treatment for conditions other than malignant neoplasm: Secondary | ICD-10-CM | POA: Insufficient documentation

## 2013-10-18 MED ORDER — PREDNISONE 10 MG PO TABS: ORAL_TABLET | ORAL | Status: DC

## 2013-10-18 NOTE — Progress Notes (Signed)
Quick Note:  Spoke with pt and notified of results per Dr. Wert. Pt verbalized understanding and denied any questions.  ______ 

## 2013-10-18 NOTE — Progress Notes (Signed)
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 Follow-up    Pt had PET scan done this am. Her cough and SOB have improved since the last visit.   saba inhaler helps some/ min mucoid sputum, temp to 99 but no rigors, no purulent sputum or hemoptysis/ does have some wt. Loss, no sweating.  rec Dulera 100 Take 2 puffs first thing in am and then another 2 puffs about 12 hours later.  Prednisone 10 mg take  4 each am x 2 days,   2 each am x 2 days,  1 each am x 2 days and stop Only use your albuterol as a rescue medication  We will arrange 24/7 02 at 3lpm per advanced  We will call to arrange a biopsy of the easiest area once I discuss this with radiology  Late add: Discussed in detail all the  indications, usual  risks and alternatives  relative to the benefits with patient who agrees to proceed with bronchoscopy with biopsy.   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 Nodular BOOP Chief Complaint  Patient presents with  . Followup with CXR    Pt states that her breathing is overall doing well. She states that she is able to more activity and feels stronger.   02 2lpm when ambulatory sats in mid 90's last dose of prednisone sev weeks ago really  helped sob and no worse since stopped.  No h/o arthralgias, rash, pet exposure. rec If condition worsens take pred x 6 days trial    09/06/2013 f/u ov/Arrie Zuercher re:  02 24/7 since Dec 2014  Chief Complaint  Patient presents with  . Follow-up    Pt states that her breathing is doing well and her cough has pretty much resolved. No new co's today. Has not had to use rescue inhaler.   Finished pred Aug 11 2013 and markedly better then a little worse since few days prior to OV   = more cough white mucus, no fever. rec Please remember to go to the lab and x-ray department downstairs for your tests - we will call you with the results when they are available. Prednisone 20 mg per day until your best then reduce to 10 mg per day x one week then one half daily as tolerated, increase back up if needed. Not really clear how much asthma present > ok to change dulera to prn    10/18/2013 f/u ov/Ander Wamser re: nodular boop Chief Complaint  Patient presents with  . Follow-up    Breathing improved, not having to wear O2 as much and SATS staying up in 90's  Flared cough at 5 and resumed 20 then flared at 10 so resumed 20 and resolved again  No obvious daytime variabilty or assoc excess mucus production or cp or chest tightness, subjective wheeze overt sinus or hb symptoms. No unusual exp hx or h/o childhood pna/ asthma or premature birth to her knowledge.   Sleeping ok without nocturnal  or early am exacerbation  of respiratory  c/o's or need for noct saba. Also denies any obvious fluctuation of symptoms with weather or environmental changes or other aggravating or alleviating factors except as outlined above   Current Medications, Allergies, Past Medical History, Past Surgical History, Family History, and Social History were reviewed in [facility] record.  ROS  The following are not active complaints unless bolded sore throat, dysphagia, dental problems, itching, sneezing,  nasal  congestion or excess/ purulent secretions, ear ache,   fever, chills, sweats, unintended wt loss, pleuritic or exertional cp, hemoptysis,  orthopnea pnd or leg swelling, presyncope, palpitations, heartburn, abdominal pain, anorexia, nausea, vomiting, diarrhea  or change in bowel or urinary habits, change in stools or urine, dysuria,hematuria,  rash, arthralgias, visual complaints, headache, numbness weakness or ataxia or problems with walking or coordination,  change in mood/affect or memory.      PMH:  -Hx of NSTEMI.07/2009 w/  cardiac cath s/p 2 stents in LAD/ RCA       >>Echo at that time showed Mild LVH, EF of 55% and grade 1 diastolic dysfuntion  -Hyperlipidemia -HTN  -OA -s/p bilat. Knee replacements - Obesity    SH:  Retired from [employer] work Former smoker -quit 1984 -smoked x 20 1 PPD  Married  No pets  No unusual hobbies  [place] on vacation frequently (1-10/2012 -traveled to [place], [place] and [place] )    [place] :  Stroke         Objective:   Physical Exam GEN: A/Ox3; pleasant , NAD, obese WF    01/31/2013         237  > 07/03/2013  232 > 07/24/2013  231 > 09/06/2013  233 > 10/18/2013  233 Wt Readings from Last 3 Encounters:  12/16/12 246 lb (111.585 kg)  11/24/12 252 lb 9.6 oz (114.579 kg)  11/10/12 245 lb 8 oz (111.358 kg)     HEENT:  [place]/AT,  EACs-clear, TMs-wnl, NOSE-clear, THROAT-clear, no lesions, no postnasal drip or exudate noted.    Edentulous   NECK:  Supple w/ fair ROM; no JVD; normal carotid impulses w/o bruits; no thyromegaly or nodules palpated; no lymphadenopathy.  RESP    slt decreased bs bilaterally, no insp crackles or exp rhonchi  CARD:  RRR, no m/r/g  , trace  peripheral edema, pulses intact, no cyanosis or clubbing., neg homans sign   GI:   Soft & nt; nml bowel sounds; no organomegaly or masses detected.  Musco: Warm bil, no deformities or joint swelling noted.  Bilateral knee scars      CXR  10/18/2013 :There is no evidence of pneumonia nor CHF.  The pulmonary interstitial markings at the lung bases have nearly normalized           Assessment & Plan:

## 2013-10-18 NOTE — Assessment & Plan Note (Signed)
11/24/2012 Office walk showed desats 86% on RA  With  spirometry FEV1 nml-73% , ratio 80   11/25/2012 CTA Chest >>neg for PE/ ILD  12/07/12 ONO > Pos 5: 14min  begin O2 2 l/m At bedtime  12/13/2012  >  Repeat 02/01/13 on 2lpm 75min> no change rx  - 01/31/2013  Walked RA x 3 laps @ 185 ft each stopped due to  End of study, no desats  - 07/03/2013 sats 75% RA >  On 3lpm sats 93%  > rx with 24 h 02 at 3lpm   - improved by self monitoring as of 07/24/13 ov so changed rx to 2lpm/24/7 humidified due to nasal dryness - 09/06/2013  Walked 2lpm x  2 laps @ 185 ft each stopped due to legs tired, no desats     As of 10/18/2013 02 2lpm hs and prn for sat > 90%

## 2013-10-18 NOTE — Patient Instructions (Signed)
You need a chest xray today to follow up your boop  Prednisone ceiling is 20 mg per day and the floor is 10 but for now stay on 20 on even and 10 on odd  Please schedule a follow up office visit in 6 weeks, call sooner if needed with cxr on return

## 2013-10-18 NOTE — Assessment & Plan Note (Signed)
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 > discussed with IR, rec FOB > done 07/05/2013 > nl airways > neg afb/fungus/path  - IR Bx 07/11/2013 > inflammatory, special stains pending but does not look like tb or fungus and responds short course pred per pt > rec prednisone x 6 days only and needs VATS bx but declines as of 07/13/2013 > rec'd final path from [place] review > c/w BOOP though can't exclude asp/infection sources - 07/19/2013 notified pt and will return 07/24/13 for cxr/ esr then consider steroid rx (needs crypto serology also) - 07/24/2013 ESR 42 off pred x 3 weeks - 07/24/13 > crypto serology neg/ Anti-CCP neg  - started mod dose prednisone 09/06/2013  >>> cxr cleared 10/18/2013 on 20 mg per day > rec taper to 20/10  Comment: The goal with a chronic steroid dependent illness is always arriving at the lowest effective dose that controls the disease/symptoms and not accepting a set "formula" which is based on statistics or guidelines that don't always take into account patient  variability or the natural hx of the dz in every individual patient, which may well vary over time.

## 2013-10-19 ENCOUNTER — Telehealth: Payer: Self-pay | Admitting: Internal Medicine

## 2013-10-19 DIAGNOSIS — R918 Other nonspecific abnormal finding of lung field: Secondary | ICD-10-CM

## 2013-10-19 MED ORDER — PREDNISONE 10 MG PO TABS: ORAL_TABLET | ORAL | Status: DC

## 2013-10-19 NOTE — Telephone Encounter (Signed)
Spoke with the pt  She is needing new rx for pred called in  Done  Nothing further needed per pt

## 2013-11-01 ENCOUNTER — Telehealth: Payer: Self-pay

## 2013-11-01 DIAGNOSIS — E2839 Other primary ovarian failure: Secondary | ICD-10-CM

## 2013-11-01 NOTE — Telephone Encounter (Signed)
Pt left v/m; pt said pulmonologist suggested pt have bone density test/; pt wants Dr Marliss Coots input about having bone density test.

## 2013-11-01 NOTE — Telephone Encounter (Signed)
It is a good idea at her age and with her current medicines The goal is to see if she needs any bone building /fracture prev medicines  If she is open to that- please let me know and I will order it

## 2013-11-02 DIAGNOSIS — E2839 Other primary ovarian failure: Secondary | ICD-10-CM | POA: Insufficient documentation

## 2013-11-02 NOTE — Telephone Encounter (Signed)
dexa referral

## 2013-11-02 NOTE — Telephone Encounter (Signed)
Pt advised of Dr. Marliss Coots comments and pt is opened to bone density test and medication if needed. Please put referral in.

## 2013-11-22 ENCOUNTER — Ambulatory Visit: Payer: Self-pay | Admitting: Family Medicine

## 2013-11-22 LAB — HM DEXA SCAN

## 2013-11-23 ENCOUNTER — Encounter: Payer: Self-pay | Admitting: Family Medicine

## 2013-11-28 ENCOUNTER — Encounter: Payer: Self-pay | Admitting: Family Medicine

## 2013-11-28 DIAGNOSIS — M8580 Other specified disorders of bone density and structure, unspecified site: Secondary | ICD-10-CM | POA: Insufficient documentation

## 2013-11-29 ENCOUNTER — Ambulatory Visit (INDEPENDENT_AMBULATORY_CARE_PROVIDER_SITE_OTHER): Payer: Commercial Managed Care - HMO | Admitting: Internal Medicine

## 2013-11-29 ENCOUNTER — Encounter: Payer: Self-pay | Admitting: Internal Medicine

## 2013-11-29 VITALS — BP 122/60 | HR 97 | Temp 97.7°F | Ht 65.5 in | Wt 238.0 lb

## 2013-11-29 DIAGNOSIS — J9610 Chronic respiratory failure, unspecified whether with hypoxia or hypercapnia: Secondary | ICD-10-CM

## 2013-11-29 DIAGNOSIS — R918 Other nonspecific abnormal finding of lung field: Secondary | ICD-10-CM

## 2013-11-29 MED ORDER — PREDNISONE 10 MG PO TABS: ORAL_TABLET | ORAL | Status: DC

## 2013-11-29 MED ORDER — RANITIDINE HCL 150 MG PO TABS: ORAL_TABLET | ORAL | Status: DC

## 2013-11-29 MED ORDER — OMEPRAZOLE MAGNESIUM 20 MG PO TBEC: 20.0000 mg | DELAYED_RELEASE_TABLET | Freq: Every day | ORAL | Status: DC

## 2013-11-29 NOTE — Patient Instructions (Addendum)
Prednisone 10 mg daily unless worse breathing or coughing then increase back to previous dose  Ok to try Try prilosec 20mg   Take 30-60 min before first meal of the day and zantac 150  mg one bedtime      Please schedule a follow up visit in 3 months but call sooner if needed with cxr

## 2013-11-29 NOTE — Progress Notes (Signed)
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 Follow-up    Pt had PET scan done this am. Her cough and SOB have improved since the last visit.   saba inhaler helps some/ min mucoid sputum, temp to 99 but no rigors, no purulent sputum or hemoptysis/ does have some wt. Loss, no sweating.  rec Dulera 100 Take 2 puffs first thing in am and then another 2 puffs about 12 hours later.  Prednisone 10 mg take  4 each am x 2 days,   2 each am x 2 days,  1 each am x 2 days and stop Only use your albuterol as a rescue medication  We will arrange 24/7 02 at 3lpm per advanced  We will call to arrange a biopsy of the easiest area once I discuss this with radiology  Late add: Discussed in detail all the  indications, usual  risks and alternatives  relative to the benefits with patient who agrees to proceed with bronchoscopy with biopsy.   FOB > done 07/05/2013 > nl airways > neg afb/fungus/path  - IR Bx 07/11/2013 > inflammatory, special stains pending but does not look like tb or fungus and responds short course pred per pt > rec prednisone x 6 days only and needs VATS bx but declines as of 07/13/2013 > rec'd final path from [place] review > c/w BOOP though can't exclude asp/infection sources     07/24/2013 f/u ov/Khaleah Duer re: ? Nodular BOOP Chief Complaint  Patient presents with  . Followup with CXR    Pt states that her breathing is overall doing well. She states that she is able to more activity and feels stronger.   02 2lpm when ambulatory sats in mid 90's last dose of prednisone sev weeks ago really  helped sob and no worse since stopped.  No h/o arthralgias, rash, pet exposure. rec If condition worsens take pred x 6 days trial    09/06/2013 f/u ov/Jordynn Perrier re:  02 24/7 since Dec 2014  Chief Complaint  Patient presents with  . Follow-up    Pt states that her breathing is doing well and her cough has pretty much resolved. No new co's today. Has not had to use rescue inhaler.   Finished pred Aug 11 2013 and markedly better then a little worse since few days prior to OV   = more cough white mucus, no fever. rec Please remember to go to the lab and x-ray department downstairs for your tests - we will call you with the results when they are available. Prednisone 20 mg per day until your best then reduce to 10 mg per day x one week then one half daily as tolerated, increase back up if needed. Not really clear how much asthma present > ok to change dulera to prn    10/18/2013 f/u ov/Jaegar Croft re: nodular boop Chief Complaint  Patient presents with  . Follow-up    Breathing improved, not having to wear O2 as much and SATS staying up in 90's   Flared cough  at 5 and resumed 20 then flared at 10 so resumed 20 and resolved again   11/29/2013 f/u ov/Sayan Aldava re: nodular boop/ off 02 x at hs on 20 /10 pred s flare of cough/sob Chief Complaint  Patient presents with  . Follow-up    Pt states that she is doing well and denies any new co's today.     No obvious daytime variabilty or assoc excess mucus production or cp or chest tightness, subjective wheeze overt sinus or hb symptoms. No unusual exp hx or h/o childhood pna/ asthma or premature birth to her knowledge.   Sleeping ok without nocturnal  or early am exacerbation  of respiratory  c/o's or need for noct saba. Also denies any obvious fluctuation of symptoms with weather or environmental changes or other aggravating or alleviating factors except as outlined above   Current Medications, Allergies, Past Medical History, Past Surgical History, Family  History, and Social History were reviewed in [facility] record.  ROS  The following are not active complaints unless bolded sore throat, dysphagia, dental problems, itching, sneezing,  nasal congestion or excess/ purulent secretions, ear ache,   fever, chills, sweats, unintended wt loss, pleuritic or exertional cp, hemoptysis,  orthopnea pnd or leg swelling, presyncope, palpitations, heartburn, abdominal pain, anorexia, nausea, vomiting, diarrhea  or change in bowel or urinary habits, change in stools or urine, dysuria,hematuria,  rash, arthralgias, visual complaints, headache, numbness weakness or ataxia or problems with walking or coordination,  change in mood/affect or memory.      PMH:  -Hx of NSTEMI.07/2009 w/  cardiac cath s/p 2 stents in LAD/ RCA       >>Echo at that time showed Mild LVH, EF of 55% and grade 1 diastolic dysfuntion  -Hyperlipidemia -HTN  -OA -s/p bilat. Knee replacements - Obesity    SH:  Retired from [employer] work Former smoker -quit 1984 -smoked x 20 1 PPD  Married  No pets  No unusual hobbies  [place] on vacation frequently (1-10/2012 -traveled to [place], [place] and [place] )    [place] :  Stroke         Objective:   Physical Exam GEN: A/Ox3; pleasant , NAD, obese WF    01/31/2013         237  > 07/03/2013  232 > 07/24/2013  231 > 09/06/2013  233 > 10/18/2013  233 > 11/29/2013 238  Wt Readings from Last 3 Encounters:  12/16/12 246 lb (111.585 kg)  11/24/12 252 lb 9.6 oz (114.579 kg)  11/10/12 245 lb 8 oz (111.358 kg)     HEENT:  [place]/AT,  EACs-clear, TMs-wnl, NOSE-clear, THROAT-clear, no lesions, no postnasal drip or exudate noted.    Edentulous   NECK:  Supple w/ fair ROM; no JVD; normal carotid impulses w/o bruits; no thyromegaly or nodules palpated; no lymphadenopathy.  RESP    slt decreased bs bilaterally, no insp crackles or exp rhonchi  CARD:  RRR, no m/r/g  , trace  peripheral edema, pulses intact, no cyanosis or clubbing., neg  homans sign   GI:   Soft & nt; nml bowel sounds; no organomegaly or masses detected.  Musco: Warm bil, no deformities or joint swelling noted.  Bilateral knee scars      CXR  10/18/2013 :There is no evidence of pneumonia nor CHF. The pulmonary interstitial markings at the lung bases have nearly normalized           Assessment & Plan:

## 2013-11-30 NOTE — Assessment & Plan Note (Signed)
11/24/2012 Office walk showed desats 86% on RA  With  spirometry FEV1 nml-73% , ratio 80   11/25/2012 CTA Chest >>neg for PE/ ILD  12/07/12 ONO > Pos 5: 12min  begin O2 2 l/m At bedtime  12/13/2012  >  Repeat 02/01/13 on 2lpm 26min> no change rx  - 01/31/2013  Walked RA x 3 laps @ 185 ft each stopped due to  End of study, no desats  - 07/03/2013 sats 75% RA >  On 3lpm sats 93%  > rx with 24 h 02 at 3lpm   - improved by self monitoring as of 07/24/13 ov so changed rx to 2lpm/24/7 humidified due to nasal dryness - 09/06/2013  Walked 2lpm x  2 laps @ 185 ft each stopped due to legs tired, no desats     As of 10/18/2013 02 2lpm hs and prn for sat > 90%   Adequate control on present rx, reviewed > no change in rx needed

## 2013-11-30 NOTE — Assessment & Plan Note (Signed)
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 > discussed with IR, rec FOB > done 07/05/2013 > nl airways > neg afb/fungus/path  - IR Bx 07/11/2013 > inflammatory, special stains pending but does not look like tb or fungus and responds short course pred per pt > rec prednisone x 6 days only and needs VATS bx but declines as of 07/13/2013 > rec'd final path from [place] review > c/w BOOP though can't exclude asp/infection sources - 07/19/2013 notified pt and will return 07/24/13 for cxr/ esr then consider steroid rx (needs crypto serology also) - 07/24/2013 ESR 42 off pred x 3 weeks - 07/24/13 > crypto serology neg/ Anti-CCP neg  - started maint   prednisone 09/06/2013  >>> cxr cleared 10/18/2013 on 20 mg per day > rec taper to 20/10 - 11/28/13 reduced to 10 mg daily   Most c/w nodular boop. The goal with a chronic steroid dependent illness is always arriving at the lowest effective dose that controls the disease/symptoms and not accepting a set "formula" which is based on statistics or guidelines that don't always take into account patient  variability or the natural hx of the dz in every individual patient, which may well vary over time.  For now therefore I recommend the patient maintain  A floor of 10 mg daily - many pts with idiopathic boop require some dose of steroids for up to a year  See instructions for specific recommendations which were reviewed directly with the patient who was given a copy with highlighter outlining the key components.

## 2014-01-24 ENCOUNTER — Ambulatory Visit (INDEPENDENT_AMBULATORY_CARE_PROVIDER_SITE_OTHER): Payer: Commercial Managed Care - HMO | Admitting: Family Medicine

## 2014-01-24 ENCOUNTER — Encounter: Payer: Self-pay | Admitting: Family Medicine

## 2014-01-24 ENCOUNTER — Ambulatory Visit (INDEPENDENT_AMBULATORY_CARE_PROVIDER_SITE_OTHER)
Admission: RE | Admit: 2014-01-24 | Discharge: 2014-01-24 | Disposition: A | Discharge status: Home / Self Care | Payer: Commercial Managed Care - HMO | Source: Ambulatory Visit | Attending: Family Medicine | Admitting: Family Medicine

## 2014-01-24 VITALS — BP 136/56 | HR 104 | Temp 98.0°F | Ht 65.5 in | Wt 248.0 lb

## 2014-01-24 DIAGNOSIS — M7582 Other shoulder lesions, left shoulder: Principal | ICD-10-CM

## 2014-01-24 DIAGNOSIS — M949 Disorder of cartilage, unspecified: Secondary | ICD-10-CM

## 2014-01-24 DIAGNOSIS — M719 Bursopathy, unspecified: Secondary | ICD-10-CM

## 2014-01-24 DIAGNOSIS — M67919 Unspecified disorder of synovium and tendon, unspecified shoulder: Secondary | ICD-10-CM

## 2014-01-24 DIAGNOSIS — M778 Other enthesopathies, not elsewhere classified: Secondary | ICD-10-CM | POA: Insufficient documentation

## 2014-01-24 DIAGNOSIS — N318 Other neuromuscular dysfunction of bladder: Secondary | ICD-10-CM

## 2014-01-24 DIAGNOSIS — M7580 Other shoulder lesions, unspecified shoulder: Secondary | ICD-10-CM

## 2014-01-24 DIAGNOSIS — M8580 Other specified disorders of bone density and structure, unspecified site: Secondary | ICD-10-CM

## 2014-01-24 DIAGNOSIS — M899 Disorder of bone, unspecified: Secondary | ICD-10-CM

## 2014-01-24 MED ORDER — ALENDRONATE SODIUM 70 MG PO TABS: 70.0000 mg | ORAL_TABLET | ORAL | Status: DC

## 2014-01-24 MED ORDER — MIRABEGRON ER 50 MG PO TB24: 50.0000 mg | ORAL_TABLET | Freq: Every day | ORAL | Status: DC

## 2014-01-24 NOTE — Progress Notes (Signed)
Pre visit review using our clinic review tool, if applicable. No additional management support is needed unless otherwise documented below in the visit note. 

## 2014-01-24 NOTE — Progress Notes (Signed)
Subjective:    Patient ID: Kelli Velasquez, female    DOB: April 28, 1941, 73 y.o.   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 - to keep pulse ox up  Sees Dr Melvyn Novas - in Aug for f/u - hopefully final xray (hopefully)  Is still on prednisone and did gain weight   Osteopenia  Fem neck -2.1 left  She has not been walking due to BOOP- but able to go to water aerobics  She is getting better however  Using light weights  Taking calcium and  vitamin D   Has not broken bones  Has not fallen   She has excellent control of acid reflux with prilosec and zantac  She may be able to stop it   More problems with overactive bladder  Up a lot at night Some incont (mixed) Sees Dr Gaynelle Arabian in aug-and needs refill of her med until then (myrbetriq) No side eff  Patient Active Problem List   Diagnosis Date Noted  . Osteopenia 11/28/2013  . Estrogen deficiency 11/02/2013  . Dyspnea 06/27/2013  . Hernia, incisional 04/03/2013  . Pulmonary nodules 02/02/2013  . Cough 12/18/2012  . Chronic respiratory failure 10/26/2012  . Special screening for malignant neoplasms, colon 06/16/2011  . DYSPHONIA 10/10/2010  . HYPERGLYCEMIA 05/27/2010  . CAD, NATIVE VESSEL 08/19/2009  . 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 OBESITY 09/01/2007  . MYOPIA 03/03/2007  . HYPERLIPIDEMIA 10/15/2006  . HYPERTENSION 10/15/2006  . ALLERGIC RHINITIS 10/15/2006  . ASTHMA 10/15/2006  . OVERACTIVE BLADDER 10/15/2006  . OSTEOARTHRITIS 10/15/2006  . URINARY INCONTINENCE 10/15/2006   Past Medical History  Diagnosis Date  . Myocardial infarction     subendocardial, initial episode  . Hyperlipidemia   . Hypertension   . Obesity     . Neoplasm of skin     neoplasm of uncertain behavior of skin  . Vertigo   . Myopia   . Overactive bladder   . Urinary incontinence   . Osteoarthritis   . Asthma   . Allergy     allergic rhinitis  . Rash     and other non specific skin eruptions  . Gallstones   . Coronary artery disease     cath January 2011 with DEs LAD and RCA  . GERD (gastroesophageal reflux disease)   . Nocturnal oxygen desaturation     o2 at night  . Wears glasses   . Full dentures    Past Surgical History  Procedure Laterality Date  . Total knee arthroplasty  2010    left , then vocal cord infection post op  . Vocal cord polypectomy    . Cholecystectomy  92  . Joint replacement  2007    right total knee replacement  . Dilation and curettage of uterus  1984  . Colonoscopy    . Coronary angioplasty with stent placement  07/2009    stent  . Incisional hernia repair N/A 05/16/2013    Procedure: HERNIA REPAIR INFRAUMILICAL INCISIONAL;  Surgeon: Joyice Faster. Cornett, MD;  Location: [facility];  Service: General;  Laterality: N/A;  umbilical  . Insertion of mesh N/A  05/16/2013    Procedure: INSERTION OF MESH;  Surgeon: Joyice Faster. Cornett, MD;  Location: [facility];  Service: General;  Laterality: N/A;  umbilical  . Video bronchoscopy Bilateral 07/05/2013    Procedure: VIDEO BRONCHOSCOPY WITH FLUORO;  Surgeon: Tanda Rockers, MD;  Location: [facility];  Service: Cardiopulmonary;  Laterality: Bilateral;   History  Substance Use Topics  . Smoking status: Former Smoker -- 1.00 packs/day for 25 years    Types: Cigarettes    Quit date: 07/27/1982  . Smokeless tobacco: Never Used  . Alcohol Use: Yes     Comment: wine occasional   Family History  Problem Relation Age of Onset  . Stroke Mother   . Stroke Father   . Colon cancer Neg Hx   . Breast cancer Maternal Grandmother   . Diabetes Mother 33   Allergies  Allergen Reactions  . Shellfish Allergy Anaphylaxis  . Aspirin      REACTION: nausea and vomiting  . Atorvastatin     REACTION: leg pain  . Crestor [Rosuvastatin Calcium] Other (See Comments)    Muscle pain - allergy/intolerance  . Fish Allergy   . Simvastatin     REACTION: muscle pain  . Trandolapril     REACTION: leg pain   Current Outpatient Prescriptions on File Prior to Visit  Medication Sig Dispense Refill  . amLODipine (NORVASC) 10 MG tablet Take 1 tablet (10 mg total) by mouth daily.  30 tablet  11  . Ascorbic Acid (VITAMIN C) 1000 MG tablet Take 1,000 mg by mouth daily.        . benzonatate (TESSALON) 200 MG capsule Take 200 mg by mouth 3 (three) times daily as needed for cough.      . calcium-vitamin D (OSCAL WITH D 500-200) 500-200 MG-UNIT per tablet Take 1 tablet by mouth daily.        . cromolyn (NASALCROM) 5.2 MG/ACT nasal spray Place 2 sprays into the nose daily.       Marland Kitchen EPINEPHrine (EPI-PEN) 0.3 mg/0.3 mL DEVI Inject 0.3 mLs (0.3 mg total) into the muscle once. As needed for allergic reaction  1 Device  3  . ezetimibe (ZETIA) 10 MG tablet Take 1 tablet (10 mg total) by mouth daily.  30 tablet  11  . furosemide (LASIX) 20 MG tablet Take 20 mg by mouth daily as needed for fluid.      . mometasone-formoterol (DULERA) 100-5 MCG/ACT AERO Take 2 puffs first thing in am and then another 2 puffs about 12 hours later.  1 Inhaler  11  . nitroGLYCERIN (NITROSTAT) 0.4 MG SL tablet Place 0.4 mg under the tongue every 5 (five) minutes as needed for chest pain.       Marland Kitchen omeprazole (PRILOSEC OTC) 20 MG tablet Take 1 tablet (20 mg total) by mouth daily.      . predniSONE (DELTASONE) 10 MG tablet One daily  100 tablet  0  . ranitidine (ZANTAC) 150 MG tablet One at bedtime      . albuterol (PROVENTIL HFA;VENTOLIN HFA) 108 (90 BASE) MCG/ACT inhaler Inhale 2 puffs into the lungs every 4 (four) hours as needed for wheezing or shortness of breath.  1 Inhaler  5   No current facility-administered medications on file prior to visit.    Review of  Systems Review of Systems  Constitutional: Negative for fever, appetite change, fatigue and unexpected weight change.  Eyes: Negative for pain and visual disturbance.  Respiratory: Negative for cough and shortness  of breath. (breathing is much improved)  Cardiovascular: Negative for cp or palpitations    Gastrointestinal: Negative for nausea, diarrhea and constipation.  Genitourinary: Negative for urgency and frequency.  Skin: Negative for pallor or rash   MSK pos for L shoulder pain  Neurological: Negative for weakness, light-headedness, numbness and headaches.  Hematological: Negative for adenopathy. Does not bruise/bleed easily.  Psychiatric/Behavioral: Negative for dysphoric mood. The patient is not nervous/anxious.         Objective:   Physical Exam  Constitutional: She appears well-developed and well-nourished. No distress.  obese and well appearing   HENT:  Head: Normocephalic and atraumatic.  Mouth/Throat: Oropharynx is clear and moist.  Eyes: Conjunctivae are normal. Pupils are equal, round, and reactive to light. Right eye exhibits no discharge. Left eye exhibits no discharge.  Neck: Normal range of motion. Neck supple.  Cardiovascular: Normal rate and regular rhythm.   Pulmonary/Chest: Effort normal and breath sounds normal. No respiratory distress. She has no wheezes. She has no rales.  Musculoskeletal: She exhibits tenderness. She exhibits no edema.  L shoulder tenderness over acromion  Pos hawking and neer tests  Limited rom / rotation and abduction  No swelling or crepitus   Lymphadenopathy:    She has no cervical adenopathy.  Neurological: She is alert.  Skin: Skin is warm and dry. No rash noted.  Psychiatric: She has a normal mood and affect.          Assessment & Plan:   Problem List Items Addressed This Visit     Musculoskeletal and Integument   Osteopenia - Primary     In pt on prednisone  No fractures Disc need for calcium/ vitamin D/ wt bearing  exercise and bone density test every 2 y to monitor Disc safety/ fracture risk in detail   Will begin fosamax for 5 y course if tol well - disc poss side eff and handout given  Rev dexa result in detail     Shoulder tendonitis     Xray today  Adv rel rest for about 2 weeks with frequent cold compress May need appt with sport med if no imp    Relevant Orders      DG Shoulder Left (Completed)     Genitourinary   OVERACTIVE BLADDER     Refilled med until she has f/u with Mamie Levers

## 2014-01-24 NOTE — Patient Instructions (Signed)
Xray of shoulder today Avoid arm exercise on the left for the next 1-2 weeks and use ice  Try generic fosamax - if any side effects let me know Continue calcium and vitamin D

## 2014-01-25 NOTE — Assessment & Plan Note (Signed)
Xray today  Adv rel rest for about 2 weeks with frequent cold compress May need appt with sport med if no imp

## 2014-01-25 NOTE — Assessment & Plan Note (Signed)
Refilled med until she has f/u with Mamie Levers

## 2014-01-25 NOTE — Assessment & Plan Note (Signed)
In pt on prednisone  No fractures Disc need for calcium/ vitamin D/ wt bearing exercise and bone density test every 2 y to monitor Disc safety/ fracture risk in detail   Will begin fosamax for 5 y course if tol well - disc poss side eff and handout given  Rev dexa result in detail

## 2014-02-01 NOTE — Progress Notes (Signed)
History of Present Illness: 73 yo female with h/o CAD, HTN, OA and obesity here today for cardiac follow up. She was admitted to [facility] from 07/27/09 to 07/31/09 with a NSTEMI. She had a cardiac cath on 07/29/09 showing severe two vessel disease and we placed Promus drug eluting stents in the LAD and the RCA. Beta blocker stopped due to wheezing with bronchitis in 2013. She has had a sleep study and was told oxygen level was dropping at night. She is on O2 at night. She has been diagnosed with BOOP and has been on prednisone.   She is here today for planned annual cardiac follow up. She has had no chest pain or SOB. She has been doing well. She is exercising daily with water aerobics. She is getting over her recent pulmonary issues.   Primary Care Physician: [place].   Last Lipid Profile:Lipid Panel     Component Value Date/Time   CHOL 160 06/13/2013 0928   TRIG 183.0* 06/13/2013 0928   HDL 38.40* 06/13/2013 0928   CHOLHDL 4 06/13/2013 0928   VLDL 36.6 06/13/2013 0928   LDLCALC 85 06/13/2013 0928     Past Medical History  Diagnosis Date  . Myocardial infarction     subendocardial, initial episode  . Hyperlipidemia   . Hypertension   . Obesity   . Neoplasm of skin     neoplasm of uncertain behavior of skin  . Vertigo   . Myopia   . Overactive bladder   . Urinary incontinence   . Osteoarthritis   . Asthma   . Allergy     allergic rhinitis  . Rash     and other non specific skin eruptions  . Gallstones   . Coronary artery disease     cath January 2011 with DEs LAD and RCA  . GERD (gastroesophageal reflux disease)   . Nocturnal oxygen desaturation     o2 at night  . Wears glasses   . Full dentures     Past Surgical History  Procedure Laterality Date  . Total knee arthroplasty  2010    left , then vocal cord infection post op  . Vocal cord polypectomy    . Cholecystectomy  92  . Joint replacement  2007    right total knee replacement  .  Dilation and curettage of uterus  1984  . Colonoscopy    . Coronary angioplasty with stent placement  07/2009    stent  . Incisional hernia repair N/A 05/16/2013    Procedure: HERNIA REPAIR INFRAUMILICAL INCISIONAL;  Surgeon: Joyice Faster. Cornett, MD;  Location: [facility];  Service: General;  Laterality: N/A;  umbilical  . Insertion of mesh N/A 05/16/2013    Procedure: INSERTION OF MESH;  Surgeon: Joyice Faster. Cornett, MD;  Location: [facility];  Service: General;  Laterality: N/A;  umbilical  . Video bronchoscopy Bilateral 07/05/2013    Procedure: VIDEO BRONCHOSCOPY WITH FLUORO;  Surgeon: Tanda Rockers, MD;  Location: [facility];  Service: Cardiopulmonary;  Laterality: Bilateral;    Current Outpatient Prescriptions  Medication Sig Dispense Refill  . albuterol (PROVENTIL HFA;VENTOLIN HFA) 108 (90 BASE) MCG/ACT inhaler Inhale 2 puffs into the lungs every 4 (four) hours as needed for wheezing or shortness of breath.  1 Inhaler  5  . alendronate (FOSAMAX) 70 MG tablet Take 70 mg by mouth every 7 (seven) days. Starting in august 2015//Take with a full glass of water on an empty  stomach.      Marland Kitchen amLODipine (NORVASC) 10 MG tablet Take 1 tablet (10 mg total) by mouth daily.  30 tablet  11  . Ascorbic Acid (VITAMIN C) 1000 MG tablet Take 1,000 mg by mouth daily.        . benzonatate (TESSALON) 200 MG capsule Take 200 mg by mouth 3 (three) times daily as needed for cough.      . calcium-vitamin D (OSCAL WITH D 500-200) 500-200 MG-UNIT per tablet Take 1 tablet by mouth daily.        . cromolyn (NASALCROM) 5.2 MG/ACT nasal spray Place 2 sprays into the nose daily.       Marland Kitchen EPINEPHrine (EPI-PEN) 0.3 mg/0.3 mL DEVI Inject 0.3 mLs (0.3 mg total) into the muscle once. As needed for allergic reaction  1 Device  3  . ezetimibe (ZETIA) 10 MG tablet Take 1 tablet (10 mg total) by mouth daily.  30 tablet  11  . furosemide (LASIX) 20 MG tablet Take 20 mg by mouth daily as needed for  fluid.      . mirabegron ER (MYRBETRIQ) 50 MG TB24 tablet Take 1 tablet (50 mg total) by mouth daily.  30 tablet  3  . mometasone-formoterol (DULERA) 100-5 MCG/ACT AERO Take 2 puffs first thing in am and then another 2 puffs about 12 hours later.  1 Inhaler  11  . nitroGLYCERIN (NITROSTAT) 0.4 MG SL tablet Place 0.4 mg under the tongue every 5 (five) minutes as needed for chest pain.       Marland Kitchen omeprazole (PRILOSEC OTC) 20 MG tablet Take 1 tablet (20 mg total) by mouth daily.      . predniSONE (DELTASONE) 10 MG tablet One daily  100 tablet  0  . ranitidine (ZANTAC) 150 MG tablet One at bedtime       No current facility-administered medications for this visit.    Allergies  Allergen Reactions  . Shellfish Allergy Anaphylaxis  . Aspirin     REACTION: nausea and vomiting  . Atorvastatin     REACTION: leg pain  . Crestor [Rosuvastatin Calcium] Other (See Comments)    Muscle pain - allergy/intolerance  . Fish Allergy   . Simvastatin     REACTION: muscle pain  . Trandolapril     REACTION: leg pain    History   Social History  . Marital Status: Married    Spouse Name: N/A    Number of Children: N/A  . Years of Education: N/A   Occupational History  . Not on file.   Social History Main Topics  . Smoking status: Former Smoker -- 1.00 packs/day for 25 years    Types: Cigarettes    Quit date: 07/27/1982  . Smokeless tobacco: Never Used  . Alcohol Use: Yes     Comment: wine occasional  . Drug Use: No  . Sexual Activity: Not on file   Other Topics Concern  . Not on file   Social History Narrative  . No narrative on file    Family History  Problem Relation Age of Onset  . Stroke Mother   . Stroke Father   . Colon cancer Neg Hx   . Breast cancer Maternal Grandmother   . Diabetes Mother 32    Review of Systems:  As stated in the HPI and otherwise negative.   BP 146/70  Pulse 93  Ht 5' 5.5" (1.664 m)  Wt 245 lb (111.131 kg)  BMI 40.14 kg/m2  LMP  07/27/1980  Physical Examination: General: Well developed, well nourished, NAD HEENT: OP clear, mucus membranes moist SKIN: warm, dry. No rashes. Neuro: No focal deficits Musculoskeletal: Muscle strength 5/5 all ext Psychiatric: Mood and affect normal Neck: No JVD, no carotid bruits, no thyromegaly, no lymphadenopathy. Lungs:Clear bilaterally, no wheezes, rhonci, crackles Cardiovascular: Regular rate and rhythm. No murmurs, gallops or rubs. Abdomen:Soft. Bowel sounds present. Non-tender.  Extremities: No lower extremity edema. Pulses are 2 + in the bilateral DP/PT.  EKG: Sinus, rate 93 bpm.   Assessment and Plan:   1. CAD: Stable. Will continue ASA and and Zetia. She is intolerant to statins. Beta blocker stopped by pulmonary secondary to possible bronchospasm. She has had no cardiac screening since her stents were placed in 2011. Will arrange Lexiscan stress myoview to exclude ischemia.   2.  HYPERTENSION:  BP well controlled at home. No changes.     3. HLD: She is on Zetia. She does not tolerate statins. Lipids well controlled.

## 2014-02-02 ENCOUNTER — Ambulatory Visit (INDEPENDENT_AMBULATORY_CARE_PROVIDER_SITE_OTHER): Payer: Medicare HMO | Admitting: Cardiovascular Disease

## 2014-02-02 ENCOUNTER — Encounter: Payer: Self-pay | Admitting: Cardiovascular Disease

## 2014-02-02 VITALS — BP 146/70 | HR 93 | Ht 65.5 in | Wt 245.0 lb

## 2014-02-02 DIAGNOSIS — I2510 Atherosclerotic heart disease of native coronary artery without angina pectoris: Secondary | ICD-10-CM

## 2014-02-02 DIAGNOSIS — E785 Hyperlipidemia, unspecified: Secondary | ICD-10-CM

## 2014-02-02 DIAGNOSIS — I10 Essential (primary) hypertension: Secondary | ICD-10-CM

## 2014-02-02 NOTE — Patient Instructions (Signed)
Your physician wants you to follow-up in:  12 months. You will receive a reminder letter in the mail two months in advance. If you don't receive a letter, please call our office to schedule the follow-up appointment.  Your physician has requested that you have a lexiscan myoview. For further information please visit [email or web address]. Please follow instruction sheet, as given.    

## 2014-03-07 ENCOUNTER — Encounter: Payer: Self-pay | Admitting: Internal Medicine

## 2014-03-07 ENCOUNTER — Ambulatory Visit (INDEPENDENT_AMBULATORY_CARE_PROVIDER_SITE_OTHER)
Admission: RE | Admit: 2014-03-07 | Discharge: 2014-03-07 | Disposition: A | Discharge status: Home / Self Care | Payer: Commercial Managed Care - HMO | Source: Ambulatory Visit | Attending: Internal Medicine | Admitting: Internal Medicine

## 2014-03-07 ENCOUNTER — Ambulatory Visit (INDEPENDENT_AMBULATORY_CARE_PROVIDER_SITE_OTHER): Payer: Commercial Managed Care - HMO | Admitting: Internal Medicine

## 2014-03-07 VITALS — BP 112/62 | HR 82 | Temp 98.1°F | Ht 65.5 in | Wt 249.4 lb

## 2014-03-07 DIAGNOSIS — R918 Other nonspecific abnormal finding of lung field: Secondary | ICD-10-CM

## 2014-03-07 MED ORDER — PREDNISONE 5 MG PO TABS: 5.0000 mg | ORAL_TABLET | Freq: Every day | ORAL | Status: DC

## 2014-03-07 NOTE — Patient Instructions (Addendum)
Prednisone 5 mg daily x 2 weeks then every other day x 2 weeks then try off - go to previous dose that worked if worsen  Please schedule a follow up office visit in 6 weeks, call sooner if needed

## 2014-03-07 NOTE — Progress Notes (Signed)
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 Follow-up    Pt had PET scan done this am. Her cough and SOB have improved since the last visit.   saba inhaler helps some/ min mucoid sputum, temp to 99 but no rigors, no purulent sputum or hemoptysis/ does have some wt. Loss, no sweating.  rec Dulera 100 Take 2 puffs first thing in am and then another 2 puffs about 12 hours later.  Prednisone 10 mg take  4 each am x 2 days,   2 each am x 2 days,  1 each am x 2 days and stop Only use your albuterol as a rescue medication  We will arrange 24/7 02 at 3lpm per advanced  We will call to arrange a biopsy of the easiest area once I discuss this with radiology  Late add: Discussed in detail all the  indications, usual  risks and alternatives  relative to the benefits with patient who agrees to proceed with bronchoscopy with biopsy.   FOB > done 07/05/2013 > nl airways > neg afb/fungus/path  - IR Bx 07/11/2013 > inflammatory, special stains pending but does not look like tb or fungus and responds short course pred per pt > rec prednisone x 6 days only and needs VATS bx but declines as of 07/13/2013 > rec'd final path from [facility] review > c/w BOOP though can't exclude asp/infection sources     07/24/2013 f/u ov/Kelli Velasquez re: ? Nodular BOOP Chief Complaint  Patient presents with  . Followup with CXR    Pt states that her breathing is overall doing well. She states that she is able to more activity and feels stronger.   02 2lpm when ambulatory sats in mid 90's last dose of  prednisone sev weeks ago really helped sob and no worse since stopped.  No h/o arthralgias, rash, pet exposure. rec If condition worsens take pred x 6 days trial    09/06/2013 f/u ov/Kelli Velasquez re:  02 24/7 since Dec 2014  Chief Complaint  Patient presents with  . Follow-up    Pt states that her breathing is doing well and her cough has pretty much resolved. No new co's today. Has not had to use rescue inhaler.   Finished pred Aug 11 2013 and markedly better then a little worse since few days prior to OV   = more cough white mucus, no fever. rec Please remember to go to the lab and x-ray department downstairs for your tests - we will call you with the results when they are available. Prednisone 20 mg per day until your best then reduce to 10 mg per day x one week then one half daily as tolerated, increase back up if needed. Not really clear how much asthma present > ok to change dulera to prn    10/18/2013 f/u ov/Kelli Velasquez re: nodular boop Chief Complaint  Patient presents with  . Follow-up    Breathing improved, not having to wear O2 as much and SATS staying up  in 90's   Flared cough at 5 and resumed 20 then flared at 10 so resumed 20 and resolved again   11/29/2013 f/u ov/Kelli Velasquez re: nodular boop/ off 02 x at hs on 20 /10 pred s flare of cough/sob Chief Complaint  Patient presents with  . Follow-up    Pt states that she is doing well and denies any new co's today.   rec Prednisone 10 mg daily unless worse breathing or coughing then increase back to previous dose Ok to try Try prilosec 20mg   Take 30-60 min before first meal of the day and zantac 150  mg one bedtime       03/07/2014 f/u ov/Kelli Velasquez re: nodular boop / pred 10 mg daily  Chief Complaint  Patient presents with  . Follow-up    Pt states doing well and denies any co's today.   Not limited by breathing from desired activities  Except by the humdity      No obvious daytime variabilty or assoc excess mucus production or cp or chest  tightness, subjective wheeze overt sinus or hb symptoms. No unusual exp hx or h/o childhood pna/ asthma or premature birth to her knowledge.   Sleeping ok without nocturnal  or early am exacerbation  of respiratory  c/o's or need for noct saba. Also denies any obvious fluctuation of symptoms with weather or environmental changes or other aggravating or alleviating factors except as outlined above   Current Medications, Allergies, Past Medical History, Past Surgical History, Family History, and Social History were reviewed in [employer] record.  ROS  The following are not active complaints unless bolded sore throat, dysphagia, dental problems, itching, sneezing,  nasal congestion or excess/ purulent secretions, ear ache,   fever, chills, sweats, unintended wt loss, pleuritic or exertional cp, hemoptysis,  orthopnea pnd or leg swelling, presyncope, palpitations, heartburn, abdominal pain, anorexia, nausea, vomiting, diarrhea  or change in bowel or urinary habits, change in stools or urine, dysuria,hematuria,  rash, arthralgias, visual complaints, headache, numbness weakness or ataxia or problems with walking or coordination,  change in mood/affect or memory.      PMH:  -Hx of NSTEMI.07/2009 w/  cardiac cath s/p 2 stents in LAD/ RCA       >>Echo at that time showed Mild LVH, EF of 55% and grade 1 diastolic dysfuntion  -Hyperlipidemia -HTN  -OA -s/p bilat. Knee replacements - Obesity    SH:  Retired from [employer] work Former smoker -quit 1984 -smoked x 20 1 PPD  Married  No pets  No unusual hobbies  [place] on vacation frequently (1-10/2012 -traveled to [place], [place] and [place] )    [place] :  Stroke         Objective:   Physical Exam GEN: A/Ox3; pleasant , NAD, obese WF  / min cushingnoid   01/31/2013         237  > 07/03/2013  232 > 07/24/2013  231 > 09/06/2013  233 > 10/18/2013  233 > 11/29/2013 238 >  03/07/2014  249  Wt Readings from Last 3 Encounters:  12/16/12 246 lb  (111.585 kg)  11/24/12 252 lb 9.6 oz (114.579 kg)  11/10/12 245 lb 8 oz (111.358 kg)     HEENT:  [place]/AT,  EACs-clear, TMs-wnl, NOSE-clear, THROAT-clear, no lesions, no postnasal drip or exudate noted.    Edentulous   NECK:  Supple w/ fair ROM; no JVD; normal carotid impulses w/o bruits; no thyromegaly or nodules palpated; no lymphadenopathy.  RESP  slt decreased bs bilaterally, no insp crackles or exp rhonchi  CARD:  RRR, no m/r/g  , trace bilateral lower ext pitting edema, pulses intact, no cyanosis or clubbing., neg homans sign   GI:   Soft & nt; nml bowel sounds; no organomegaly or masses detected.  Musco: Warm bil, no deformities or joint swelling noted.  Bilateral knee scars       cxr 03/07/14   No active lung disease. Stable cardiomegaly.          Assessment & Plan:

## 2014-03-08 NOTE — Assessment & Plan Note (Signed)
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 - 07/19/2013 notified pt and will return 07/24/13 for cxr/ esr then consider steroid rx (needs crypto serology also) - 07/24/2013 ESR 42 off pred x 3 weeks - 07/24/13 > crypto serology neg/ Anti-CCP neg  - started maint   prednisone 09/06/2013  >>> cxr cleared 10/18/2013 on 20 mg per day > rec taper to 20/10 - 11/28/13 reduced to 10 mg daily   The goal with a chronic steroid dependent illness is always arriving at the lowest effective dose that controls the disease/symptoms and not accepting a set "formula" which is based on statistics or guidelines that don't always take into account patient  variability or the natural hx of the dz in every individual patient, which may well vary over time.  For now therefore I recommend the patient      5 mg x 2 weeks then 5 mg qod x 2 weeks and stop.

## 2014-03-16 ENCOUNTER — Telehealth: Payer: Self-pay | Admitting: Internal Medicine

## 2014-03-16 MED ORDER — HYDROCODONE-HOMATROPINE 5-1.5 MG/5ML PO SYRP: 5.0000 mL | ORAL_SOLUTION | Freq: Four times a day (QID) | ORAL | Status: DC | PRN

## 2014-03-16 NOTE — Telephone Encounter (Signed)
Called spoke with pt. She reports she has reduced prednisone to 5 mg. She increased prednisone to 10 mg. She is asking for hydromet for her prod cough white-yellow phlem. Please advise MW thanks  Allergies  Allergen Reactions  . Shellfish Allergy Anaphylaxis  . Aspirin     REACTION: nausea and vomiting High doses  . Atorvastatin     REACTION: leg pain  . Crestor [Rosuvastatin Calcium] Other (See Comments)    Muscle pain - allergy/intolerance  . Fish Allergy   . Simvastatin     REACTION: muscle pain  . Trandolapril     REACTION: leg pain

## 2014-03-16 NOTE — Telephone Encounter (Signed)
RX printed and placed on MW desk to sign. Pt aware she can come pick this up. Nothing further  needed

## 2014-03-16 NOTE — Telephone Encounter (Signed)
Ok for 120 cc hydromet but should go up to 20 mg pred per day if cough really bad then work her way back down as before

## 2014-03-19 ENCOUNTER — Encounter (HOSPITAL_COMMUNITY): Payer: Commercial Managed Care - HMO

## 2014-03-29 ENCOUNTER — Ambulatory Visit (HOSPITAL_COMMUNITY): Payer: Medicare HMO | Attending: Cardiology | Admitting: Radiology

## 2014-03-29 VITALS — BP 154/70 | HR 89 | Ht 65.5 in | Wt 248.0 lb

## 2014-03-29 DIAGNOSIS — Z9861 Coronary angioplasty status: Secondary | ICD-10-CM | POA: Diagnosis not present

## 2014-03-29 DIAGNOSIS — I2510 Atherosclerotic heart disease of native coronary artery without angina pectoris: Secondary | ICD-10-CM | POA: Insufficient documentation

## 2014-03-29 DIAGNOSIS — R9439 Abnormal result of other cardiovascular function study: Secondary | ICD-10-CM | POA: Insufficient documentation

## 2014-03-29 DIAGNOSIS — J45909 Unspecified asthma, uncomplicated: Secondary | ICD-10-CM | POA: Diagnosis not present

## 2014-03-29 DIAGNOSIS — I252 Old myocardial infarction: Secondary | ICD-10-CM | POA: Diagnosis not present

## 2014-03-29 MED ORDER — TECHNETIUM TC 99M SESTAMIBI GENERIC - CARDIOLITE
30.0000 | Freq: Once | INTRAVENOUS | Status: AC | PRN
  Administered 2014-03-29: 30 via INTRAVENOUS

## 2014-03-29 MED ORDER — REGADENOSON 0.4 MG/5ML IV SOLN
0.4000 mg | Freq: Once | INTRAVENOUS | Status: AC
  Administered 2014-03-29: 0.4 mg via INTRAVENOUS

## 2014-03-29 NOTE — Progress Notes (Signed)
[facility] [address] [place], [place] [address] [phone]    Cardiology Nuclear Med Study  Kelli Velasquez is a 73 y.o. female     MRN : [id number]     DOB: Oct 01, 1940  Procedure Date: 03/29/2014  Nuclear Med Background Indication for Stress Test:  Evaluation for Ischemia and Stent Patency History:  CAD, MI, Stent, Asthma Cardiac Risk Factors: History of Smoking, Hypertension and Lipids  Symptoms:  None indicated   Nuclear Pre-Procedure Caffeine/Decaff Intake:  None > 12 hrs NPO After: 8:00pm   Lungs:  clear O2 Sat: 93% on room air. IV 0.9% NS with Angio Cath:  22g  IV Site: L Forearm x 1, tolerated well IV Started by:  Irven Baltimore, RN  Chest Size (in):  44 Cup Size: DD  Height: 5' 5.5" (1.664 m)  Weight:  248 lb (112.492 kg)  BMI:  Body mass index is 40.63 kg/(m^2). Tech Comments:  Patient took Norvasc, and held Lasix  this am. Irven Baltimore, RN.    Nuclear Med Study 1 or 2 day study: 2 day  Stress Test Type:  Treadmill/Lexiscan  Reading MD: N/A  Order Authorizing Provider:  Lauree Chandler, MD  Resting Radionuclide: Technetium 26m Sestamibi  Resting Radionuclide Dose: 33.0 mCi on 04/03/14   Stress Radionuclide:  Technetium 62m Sestamibi  Stress Radionuclide Dose: 33.0 mCi on 03/29/14           Stress Protocol Rest HR: 89 Stress HR: 120  Rest BP: 154/70 Stress BP: 122/105  Exercise Time (min): n/a METS: n/a           Dose of Adenosine (mg):  n/a Dose of Lexiscan: 0.4 mg  Dose of Atropine (mg): n/a Dose of Dobutamine: n/a mcg/kg/min (at max HR)  Stress Test Technologist: Glade Lloyd, BS-ES  Nuclear Technologist:  Annye Rusk, CNMT     Rest Procedure:  Myocardial perfusion imaging was performed at rest 45 minutes following the intravenous administration of Technetium 27m Sestamibi. Rest ECG: Sinus rhythm heart rate 89 with occasional PACs, poor R wave progression, low-voltage precordial leads.  Stress Procedure:  The  patient received IV Lexiscan 0.4 mg over 15-seconds with concurrent low level exercise and then Technetium 35m Sestamibi was injected at 30-seconds while the patient continued walking one more minute.  Quantitative spect images were obtained after a 45-minute delay.  During the infusion of Lexiscan the patient complained of head feeling full and SOB.  These symptoms resolved in recovery.  Stress ECG: No significant change from baseline ECG Occasional PVC.    QPS Raw Data Images:  Normal; no motion artifact; normal heart/lung ratio. Stress Images:  Normal homogeneous uptake in all areas of the myocardium. Rest Images:  Mild decreased radiotracer uptake at rest along inferior wall distribution. Inferior wall distribution was normal at stress. No ischemia. Subtraction (SDS):  No evidence of ischemia. Transient Ischemic Dilatation (Normal <1.22):  1.05 Lung/Heart Ratio (Normal <0.45):  0.39  Quantitative Gated Spect Images QGS EDV:  86 ml QGS ESV:  36 ml  Impression Exercise Capacity:  Lexiscan with low level exercise. BP Response:  Normal blood pressure response. Clinical Symptoms:  Typical symptoms with LexiScan ECG Impression:  No significant ST segment change suggestive of ischemia. Comparison with Prior Nuclear Study: No images to compare  Overall Impression:  Low risk stress nuclear study with no significant ischemia identified.  LV Ejection Fraction: 58%.  LV Wall Motion:  NL LV Function; NL Wall Motion  SKAINS,  MARK, MD

## 2014-04-03 ENCOUNTER — Ambulatory Visit (HOSPITAL_COMMUNITY): Payer: Medicare HMO | Attending: Cardiovascular Disease

## 2014-04-03 DIAGNOSIS — R0989 Other specified symptoms and signs involving the circulatory and respiratory systems: Secondary | ICD-10-CM

## 2014-04-03 MED ORDER — TECHNETIUM TC 99M SESTAMIBI GENERIC - CARDIOLITE
33.0000 | Freq: Once | INTRAVENOUS | Status: AC | PRN
Start: 2014-04-03 — End: 2014-04-03
  Administered 2014-04-03: 33 via INTRAVENOUS

## 2014-04-18 ENCOUNTER — Encounter: Payer: Self-pay | Admitting: Internal Medicine

## 2014-04-18 ENCOUNTER — Ambulatory Visit (INDEPENDENT_AMBULATORY_CARE_PROVIDER_SITE_OTHER): Payer: Commercial Managed Care - HMO | Admitting: Internal Medicine

## 2014-04-18 VITALS — BP 120/60 | HR 102 | Temp 97.7°F | Ht 65.5 in | Wt 255.6 lb

## 2014-04-18 DIAGNOSIS — R0902 Hypoxemia: Secondary | ICD-10-CM

## 2014-04-18 DIAGNOSIS — J9610 Chronic respiratory failure, unspecified whether with hypoxia or hypercapnia: Secondary | ICD-10-CM

## 2014-04-18 DIAGNOSIS — R918 Other nonspecific abnormal finding of lung field: Secondary | ICD-10-CM

## 2014-04-18 DIAGNOSIS — Z23 Encounter for immunization: Secondary | ICD-10-CM

## 2014-04-18 DIAGNOSIS — J9611 Chronic respiratory failure with hypoxia: Secondary | ICD-10-CM

## 2014-04-18 NOTE — Assessment & Plan Note (Signed)
11/24/2012 Office walk showed desats 86% on RA  With  spirometry FEV1 nml-73% , ratio 80   11/25/2012 CTA Chest >>neg for PE/ ILD  12/07/12 ONO > Pos 5: 32min  begin O2 2 l/m At bedtime  12/13/2012  >  Repeat 02/01/13 on 2lpm 23min> no change rx  - 01/31/2013  Walked RA x 3 laps @ 185 ft each stopped due to  End of study, no desats  - 07/03/2013 sats 75% RA >  On 3lpm sats 93%  > rx with 24 h 02 at 3lpm   - improved by self monitoring as of 07/24/13 ov so changed rx to 2lpm/24/7 humidified due to nasal dryness - 09/06/2013  Walked 2lpm x  2 laps @ 185 ft each stopped due to legs tired, no desats    - 04/18/2014  Walked RA x 2 laps @ 185 ft each stopped due to  Hip pain, slow pace, sats 89% at end   As of 04/18/2014 02 2lpm at hs only

## 2014-04-18 NOTE — Assessment & Plan Note (Signed)
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

## 2014-04-18 NOTE — Progress Notes (Signed)
Subjective:    Patient ID: Kelli Velasquez, female    DOB: 1941-06-16   MRN: [id number]  Brief patient profile:  58 yowf quit smoking in 1984 at wt 160- 180   with h/o CAD, HTN, OA and obesity presents for an initial pulmonary consult for cough x 6 weeks and recurrent bronchitis flares x 1 year ~10/2011 referred by [facility] with nodular lung dz ? Etiology      History of Present Illness  07/03/2013 f/u ov/Kelli Velasquez re: sob/cough/ wheeze/ MPN Chief Complaint  Patient presents with  . Follow-up    Pt had PET scan done this am. Her cough and SOB have improved since the last visit.   saba inhaler helps some/ min mucoid sputum, temp to 99 but no rigors, no purulent sputum or hemoptysis/ does have some wt. Loss, no sweating.  rec Dulera 100 Take 2 puffs first thing in am and then another 2 puffs about 12 hours later.  Prednisone 10 mg take  4 each am x 2 days,   2 each am x 2 days,  1 each am x 2 days and stop Only use your albuterol as a rescue medication  We will arrange 24/7 02 at 3lpm per advanced  We will call to arrange a biopsy of the easiest area once I discuss this with radiology  Late add: Discussed in detail all the  indications, usual  risks and alternatives  relative to the benefits with patient who agrees to proceed with bronchoscopy with biopsy.   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 Nodular BOOP Chief Complaint  Patient presents with  . Followup with CXR    Pt states that her breathing is overall doing well. She states that she is able to more activity and feels stronger.   02 2lpm when ambulatory sats in mid 90's last dose of  prednisone sev weeks ago really helped sob and no worse since stopped.  No h/o arthralgias, rash, pet exposure. rec If condition worsens take pred x 6 days trial    09/06/2013 f/u ov/Kelli Velasquez re:  02 24/7 since Dec 2014  Chief Complaint  Patient presents with  . Follow-up    Pt states that her breathing is doing well and her cough has pretty much resolved. No new co's today. Has not had to use rescue inhaler.   Finished pred Aug 11 2013 and markedly better then a little worse since few days prior to OV   = more cough white mucus, no fever. rec Please remember to go to the lab and x-ray department downstairs for your tests - we will call you with the results when they are available. Prednisone 20 mg per day until your best then reduce to 10 mg per day x one week then one half daily as tolerated, increase back up if needed. Not really clear how much asthma present > ok to change dulera to prn    10/18/2013 f/u ov/Kelli Velasquez re: nodular boop Chief Complaint  Patient presents with  . Follow-up    Breathing improved, not having to wear O2 as much and SATS staying up  in 90's   Flared cough at 5 and resumed 20 then flared at 10 so resumed 20 and resolved again   11/29/2013 f/u ov/Kelli Velasquez re: nodular boop/ off 02 x at hs on 20 /10 pred s flare of cough/sob Chief Complaint  Patient presents with  . Follow-up    Pt states that she is doing well and denies any new co's today.   rec Prednisone 10 mg daily unless worse breathing or coughing then increase back to previous dose Ok to try Try prilosec 20mg   Take 30-60 min before first meal of the day and zantac 150  mg one bedtime       03/07/2014 f/u ov/Kelli Velasquez re: nodular boop / pred 10 mg daily  Chief Complaint  Patient presents with  . Follow-up    Pt states doing well and denies any co's today.   Not limited by breathing from desired activities  Except by the humdity   rec Taper pred off by around 04/09/14    04/18/2014 f/u ov/Kelli Velasquez re: nodular boop x 2  weeks off pred Chief Complaint  Patient presents with  . Follow-up    Pt stopped pred x 2 wks ago- since then c/o fatigue.    no cough, no nausea/fever/ sweats. Fatigue was bad the first week off pred and better the next   No   cp or chest tightness, subjective wheeze overt sinus or hb symptoms. No unusual exp hx or h/o childhood pna/ asthma or premature birth to her knowledge.   Sleeping ok without nocturnal  or early am exacerbation  of respiratory  c/o's or need for noct saba. Also denies any obvious fluctuation of symptoms with weather or environmental changes or other aggravating or alleviating factors except as outlined above   Current Medications, Allergies, Past Medical History, Past Surgical History, Family History, and Social History were reviewed in [facility] record.  ROS  The following are not active complaints unless bolded sore throat, dysphagia, dental problems, itching, sneezing,  nasal congestion or excess/ purulent secretions, ear ache,   fever, chills, sweats, unintended wt loss, pleuritic or exertional cp, hemoptysis,  orthopnea pnd or leg swelling, presyncope, palpitations, heartburn, abdominal pain, anorexia, nausea, vomiting, diarrhea  or change in bowel or urinary habits, change in stools or urine, dysuria,hematuria,  rash, arthralgias, visual complaints, headache, numbness weakness or ataxia or problems with walking or coordination,  change in mood/affect or memory.      PMH:  -Hx of NSTEMI.07/2009 w/  cardiac cath s/p 2 stents in LAD/ RCA       >>Echo at that time showed Mild LVH, EF of 55% and grade 1 diastolic dysfuntion  -Hyperlipidemia -HTN  -OA -s/p bilat. Knee replacements - Obesity    SH:  Retired from [employer] work Former smoker -quit 1984 -smoked x 20 1 PPD  Married  No pets  No unusual hobbies  [place] on vacation frequently (1-10/2012 -traveled to [place], [place] and [place] )    [place] :  Stroke         Objective:   Physical  Exam GEN: A/Ox3; pleasant , NAD, obese WF  / min cushingnoid   01/31/2013         237  > 07/03/2013  232 > 07/24/2013  231 > 09/06/2013  233 > 04/18/2014 256  10/18/2013  233 > 11/29/2013 238 >  03/07/2014  249  Wt Readings from Last 3 Encounters:  12/16/12 246 lb (111.585 kg)  11/24/12 252 lb 9.6 oz (  114.579 kg)  11/10/12 245 lb 8 oz (111.358 kg)     HEENT:  [place]/AT,  EACs-clear, TMs-wnl, NOSE-clear, THROAT-clear, no lesions, no postnasal drip or exudate noted.    Edentulous   NECK:  Supple w/ fair ROM; no JVD; normal carotid impulses w/o bruits; no thyromegaly or nodules palpated; no lymphadenopathy.  RESP    slt decreased bs bilaterally, no insp crackles or exp rhonchi  CARD:  RRR, no m/r/g  , trace bilateral lower ext pitting edema, pulses intact, no cyanosis or clubbing., neg homans sign   GI:   Soft & nt; nml bowel sounds; no organomegaly or masses detected.  Musco: Warm bil, no deformities or joint swelling noted.  Bilateral knee scars       cxr 03/07/14   No active lung disease. Stable cardiomegaly.          Assessment & Plan:

## 2014-04-18 NOTE — Patient Instructions (Addendum)
Please schedule a follow up office visit in 4 weeks, sooner if needed with cxr

## 2014-05-08 ENCOUNTER — Encounter: Payer: Self-pay | Admitting: Family Medicine

## 2014-05-08 ENCOUNTER — Ambulatory Visit (INDEPENDENT_AMBULATORY_CARE_PROVIDER_SITE_OTHER): Payer: Commercial Managed Care - HMO | Admitting: Family Medicine

## 2014-05-08 VITALS — BP 128/66 | HR 84 | Temp 98.0°F | Ht 65.5 in | Wt 255.2 lb

## 2014-05-08 DIAGNOSIS — R5383 Other fatigue: Secondary | ICD-10-CM

## 2014-05-08 DIAGNOSIS — R600 Localized edema: Secondary | ICD-10-CM

## 2014-05-08 LAB — CBC WITH DIFFERENTIAL/PLATELET
Basophils Absolute: 0.1 10*3/uL (ref 0.0–0.1)
Basophils Relative: 0.4 % (ref 0.0–3.0)
Eosinophils Absolute: 0.4 10*3/uL (ref 0.0–0.7)
Eosinophils Relative: 3.4 % (ref 0.0–5.0)
HCT: 42.1 % (ref 36.0–46.0)
Hemoglobin: 13.7 g/dL (ref 12.0–15.0)
LYMPHS PCT: 15.2 % (ref 12.0–46.0)
Lymphs Abs: 1.9 10*3/uL (ref 0.7–4.0)
MCHC: 32.6 g/dL (ref 30.0–36.0)
MCV: 92.4 fl (ref 78.0–100.0)
Monocytes Absolute: 0.8 10*3/uL (ref 0.1–1.0)
Monocytes Relative: 6.4 % (ref 3.0–12.0)
Neutro Abs: 9.4 10*3/uL — ABNORMAL HIGH (ref 1.4–7.7)
Neutrophils Relative %: 74.6 % (ref 43.0–77.0)
Platelets: 285 10*3/uL (ref 150.0–400.0)
RBC: 4.56 Mil/uL (ref 3.87–5.11)
RDW: 16.6 % — ABNORMAL HIGH (ref 11.5–15.5)
WBC: 12.6 10*3/uL — ABNORMAL HIGH (ref 4.0–10.5)

## 2014-05-08 LAB — TSH: TSH: 0.62 u[IU]/mL (ref 0.35–4.50)

## 2014-05-08 LAB — COMPREHENSIVE METABOLIC PANEL
ALT: 22 U/L (ref 0–35)
AST: 26 U/L (ref 0–37)
Albumin: 3.4 g/dL — ABNORMAL LOW (ref 3.5–5.2)
Alkaline Phosphatase: 78 U/L (ref 39–117)
BUN: 15 mg/dL (ref 6–23)
CHLORIDE: 101 meq/L (ref 96–112)
CO2: 29 mEq/L (ref 19–32)
CREATININE: 0.8 mg/dL (ref 0.4–1.2)
Calcium: 9.3 mg/dL (ref 8.4–10.5)
GFR: 79.02 mL/min (ref >60.00)
Glucose, Bld: 80 mg/dL (ref 70–99)
Potassium: 4 mEq/L (ref 3.5–5.1)
Sodium: 140 mEq/L (ref 135–145)
Total Bilirubin: 0.6 mg/dL (ref 0.2–1.2)
Total Protein: 7.4 g/dL (ref 6.0–8.3)

## 2014-05-08 LAB — BRAIN NATRIURETIC PEPTIDE: Pro B Natriuretic peptide (BNP): 50 pg/mL (ref 0.0–100.0)

## 2014-05-08 LAB — VITAMIN B12: VITAMIN B 12: 201 pg/mL — AB (ref 211–911)

## 2014-05-08 NOTE — Assessment & Plan Note (Signed)
This has worsened since she stopped prednisone for BOOP Claims to sleep well with 02 and denies snoring or sleep apnea or depression  Lab today

## 2014-05-08 NOTE — Progress Notes (Signed)
Pre visit review using our clinic review tool, if applicable. No additional management support is needed unless otherwise documented below in the visit note. 

## 2014-05-08 NOTE — Assessment & Plan Note (Signed)
In obese pt with BOOP and also CAD/ HTN  No cardiac symptoms  Poss venous insuff  Disc low sodium diet / leg elevation and inc water intake  Rev meds Lab today incl BNP  Then adv  Of note- already on lasix 20 daily

## 2014-05-08 NOTE — Progress Notes (Signed)
Subjective:    Patient ID: Kelli Velasquez, female    DOB: Aug 03, 1940, 73 y.o.   MRN: [id number]  HPI Here for leg and feet swelling  About a week on and off   Is also exhausted  Almost cannot function  Wakes up tired  No hx of sleep apnea  Is on 02 -- for BOOP -and she generally sleeps well  No snoring since starting that Has not had a sleep study   No cp  A little inc in her wheezing from ragweed- but no inc in sob in general (has BOOP) Had a good check and stress test in July    She is off prednisone now  4 weeks  Wt is stable for now (was up 7 lb prior likely due to prior) Appetite is decreased since stopping it  She is eating less/ cutting back   Recently started back mybetriq (so she stopped it to see if it is the cause)   Has been on amlodipine for quite a while   No nsaids  She takes acetaminophen only for joint pain  Prednisone helped while she was on it    Patient Active Problem List   Diagnosis Date Noted  . Shoulder tendonitis 01/24/2014  . Osteopenia 11/28/2013  . Estrogen deficiency 11/02/2013  . Dyspnea 06/27/2013  . Hernia, incisional 04/03/2013  . 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 Cough 12/18/2012  . Chronic respiratory failure with hypoxia 10/26/2012  . Special screening for malignant neoplasms, colon 06/16/2011  . DYSPHONIA 10/10/2010  . HYPERGLYCEMIA 05/27/2010  . CAD, NATIVE VESSEL 08/19/2009  . 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 OBESITY 09/01/2007  . MYOPIA 03/03/2007  . HYPERLIPIDEMIA 10/15/2006  . HYPERTENSION 10/15/2006  . ALLERGIC RHINITIS 10/15/2006  . ASTHMA 10/15/2006  . OVERACTIVE BLADDER 10/15/2006  . OSTEOARTHRITIS 10/15/2006  . URINARY INCONTINENCE 10/15/2006   Past Medical History  Diagnosis Date  . Myocardial infarction     subendocardial, initial episode  . Hyperlipidemia   . Hypertension   . Obesity   . Neoplasm of skin     neoplasm of uncertain behavior of skin    . Vertigo   . Myopia   . Overactive bladder   . Urinary incontinence   . Osteoarthritis   . Asthma   . Allergy     allergic rhinitis  . Rash     and other non specific skin eruptions  . Gallstones   . Coronary artery disease     cath January 2011 with DEs LAD and RCA  . GERD (gastroesophageal reflux disease)   . Nocturnal oxygen desaturation     o2 at night  . Wears glasses   . Full dentures    Past Surgical History  Procedure Laterality Date  . Total knee arthroplasty  2010    left , then vocal cord infection post op  . Vocal cord polypectomy    . Cholecystectomy  89  . Joint replacement  2007    right total knee replacement  . Dilation and curettage of uterus  1984  . Colonoscopy    . Coronary angioplasty with stent placement  07/2009    stent  . Incisional hernia repair N/A 05/16/2013    Procedure: HERNIA REPAIR INFRAUMILICAL INCISIONAL;  Surgeon: Joyice Faster. Cornett, MD;  Location: [facility];  Service: General;  Laterality: N/A;  umbilical  . Insertion of mesh N/A 05/16/2013    Procedure: INSERTION OF MESH;  Surgeon: Joyice Faster.  Cornett, MD;  Location: [facility];  Service: General;  Laterality: N/A;  umbilical  . Video bronchoscopy Bilateral 07/05/2013    Procedure: VIDEO BRONCHOSCOPY WITH FLUORO;  Surgeon: Tanda Rockers, MD;  Location: [facility];  Service: Cardiopulmonary;  Laterality: Bilateral;   History  Substance Use Topics  . Smoking status: Former Smoker -- 1.00 packs/day for 25 years    Types: Cigarettes    Quit date: 07/27/1982  . Smokeless tobacco: Never Used  . Alcohol Use: No   Family History  Problem Relation Age of Onset  . Stroke Mother   . Stroke Father   . Colon cancer Neg Hx   . Breast cancer Maternal Grandmother   . Diabetes Mother 40   Allergies  Allergen Reactions  . Shellfish Allergy Anaphylaxis  . Aspirin     REACTION: nausea and vomiting High doses  . Atorvastatin     REACTION: leg pain  .  Crestor [Rosuvastatin Calcium] Other (See Comments)    Muscle pain - allergy/intolerance  . Fish Allergy   . Simvastatin     REACTION: muscle pain  . Trandolapril     REACTION: leg pain   Current Outpatient Prescriptions on File Prior to Visit  Medication Sig Dispense Refill  . alendronate (FOSAMAX) 70 MG tablet Take 70 mg by mouth every 7 (seven) days. Starting in august 2015//Take with a full glass of water on an empty stomach.      Marland Kitchen amLODipine (NORVASC) 10 MG tablet Take 1 tablet (10 mg total) by mouth daily.  30 tablet  11  . Ascorbic Acid (VITAMIN C) 1000 MG tablet Take 1,000 mg by mouth daily.        Marland Kitchen aspirin 81 MG tablet Take 81 mg by mouth daily.      . benzonatate (TESSALON) 200 MG capsule Take 200 mg by mouth 3 (three) times daily as needed for cough.      . calcium-vitamin D (OSCAL WITH D 500-200) 500-200 MG-UNIT per tablet Take 1 tablet by mouth daily.        . cromolyn (NASALCROM) 5.2 MG/ACT nasal spray Place 2 sprays into the nose daily.       Marland Kitchen EPINEPHrine (EPI-PEN) 0.3 mg/0.3 mL DEVI Inject 0.3 mLs (0.3 mg total) into the muscle once. As needed for allergic reaction  1 Device  3  . ezetimibe (ZETIA) 10 MG tablet Take 1 tablet (10 mg total) by mouth daily.  30 tablet  11  . furosemide (LASIX) 20 MG tablet Take 20 mg by mouth daily as needed for fluid.      . nitroGLYCERIN (NITROSTAT) 0.4 MG SL tablet Place 0.4 mg under the tongue every 5 (five) minutes as needed for chest pain.       Marland Kitchen omeprazole (PRILOSEC OTC) 20 MG tablet Take 1 tablet (20 mg total) by mouth daily.      . ranitidine (ZANTAC) 150 MG tablet One at bedtime      . albuterol (PROVENTIL HFA;VENTOLIN HFA) 108 (90 BASE) MCG/ACT inhaler Inhale 2 puffs into the lungs every 4 (four) hours as needed for wheezing or shortness of breath.  1 Inhaler  5   No current facility-administered medications on file prior to visit.     Review of Systems Review of Systems  Constitutional: Negative for fever, appetite change,   and unexpected weight change. pos for difficulty trying to loose wt  Eyes: Negative for pain and visual disturbance.  Respiratory: Negative for cough and shortness of  breath(more than baseline).   Cardiovascular: Negative for cp or palpitations    Gastrointestinal: Negative for nausea, diarrhea and constipation.  Genitourinary: Negative for urgency and frequency.  Skin: Negative for pallor or rash   MSK pos for pain in L shoulder and arm w/o swelling  Neurological: Negative for weakness, light-headedness, numbness and headaches.  Hematological: Negative for adenopathy. Does not bruise/bleed easily.  Psychiatric/Behavioral: Negative for dysphoric mood. The patient is not nervous/anxious.         Objective:   Physical Exam  Constitutional: She appears well-developed and well-nourished. No distress.  obese and well appearing   HENT:  Head: Normocephalic and atraumatic.  Mouth/Throat: Oropharynx is clear and moist.  Eyes: Conjunctivae and EOM are normal. Pupils are equal, round, and reactive to light. No scleral icterus.  Neck: Normal range of motion. Neck supple. No JVD present. Carotid bruit is not present. No thyromegaly present.  Cardiovascular: Normal rate, regular rhythm, normal heart sounds and intact distal pulses.  Exam reveals no gallop and no friction rub.   No murmur heard. Pulmonary/Chest: Effort normal and breath sounds normal. No respiratory distress. She has no wheezes. She has no rales.  No crackles   Abdominal: Soft. Bowel sounds are normal. She exhibits no distension, no abdominal bruit, no pulsatile midline mass and no mass. There is no tenderness.  Musculoskeletal: She exhibits edema.  Trace edema with puffiness in ankles and lower half of leg bilat  No tenderness/ erythema or palpable cords   Lymphadenopathy:    She has no cervical adenopathy.  Neurological: She is alert. She has normal reflexes. No cranial nerve deficit. She exhibits normal muscle tone.  Coordination normal.  Skin: Skin is warm and dry. No rash noted. No erythema. No pallor.  Psychiatric: She has a normal mood and affect.          Assessment & Plan:   Problem List Items Addressed This Visit     Other   Fatigue     This has worsened since she stopped prednisone for BOOP Claims to sleep well with 02 and denies snoring or sleep apnea or depression  Lab today     Relevant Orders      CBC with Differential      TSH      Vitamin B12   Pedal edema - Primary     In obese pt with BOOP and also CAD/ HTN  No cardiac symptoms  Poss venous insuff  Disc low sodium diet / leg elevation and inc water intake  Rev meds Lab today incl BNP  Then adv  Of note- already on lasix 20 daily    Relevant Orders      Comprehensive metabolic panel      TSH      Brain natriuretic peptide

## 2014-05-08 NOTE — Patient Instructions (Signed)
Labs today  Work on low sodium diet  Elevate feet when sitting  Walk as much as you can  Drink more water  Continue your furosemide Labs for fatigue and swelling today  Stop at check out for appointment with Dr Lorelei Pont for your shoulder

## 2014-05-11 ENCOUNTER — Ambulatory Visit (INDEPENDENT_AMBULATORY_CARE_PROVIDER_SITE_OTHER): Payer: Commercial Managed Care - HMO | Admitting: *Deleted

## 2014-05-11 DIAGNOSIS — E538 Deficiency of other specified B group vitamins: Secondary | ICD-10-CM

## 2014-05-11 MED ORDER — CYANOCOBALAMIN 1000 MCG/ML IJ SOLN
1000.0000 ug | Freq: Once | INTRAMUSCULAR | Status: AC
  Administered 2014-05-11: 1000 ug via INTRAMUSCULAR

## 2014-05-11 NOTE — Telephone Encounter (Signed)
Nothing was typed/tmj 

## 2014-05-14 ENCOUNTER — Encounter: Payer: Self-pay | Admitting: Family Medicine

## 2014-05-14 ENCOUNTER — Ambulatory Visit (INDEPENDENT_AMBULATORY_CARE_PROVIDER_SITE_OTHER): Payer: Commercial Managed Care - HMO | Admitting: Family Medicine

## 2014-05-14 VITALS — BP 118/70 | HR 96 | Temp 98.1°F | Ht 65.5 in | Wt 256.0 lb

## 2014-05-14 DIAGNOSIS — M7502 Adhesive capsulitis of left shoulder: Secondary | ICD-10-CM

## 2014-05-14 MED ORDER — TRAMADOL HCL 50 MG PO TABS: 50.0000 mg | ORAL_TABLET | Freq: Four times a day (QID) | ORAL | Status: DC | PRN

## 2014-05-14 NOTE — Progress Notes (Signed)
Dr. Frederico Hamman T. Mery Guadalupe, MD, [facility] [place], [address] Phone: [phone] Fax: [phone]  05/14/2014  Patient: Kelli Velasquez, MRN: [id number], DOB: 29-Sep-1940, 73 y.o.  Primary Physician:  Loura Pardon, MD  Chief Complaint: Shoulder Pain  Subjective:   Kelli Velasquez is a 73 y.o. very pleasant female patient Body mass index is 41.94 kg/(m^2).  who presents with the following: shoulder pain  The patient noted above presents with shoulder pain that has been ongoing for 2 mo The patient denies neck pain or radicular symptoms. No shoulder blade pain Denies dislocation, subluxation, separation of the shoulder. The patient does complain of pain with flexion, abduction, and terminal motion.  Significant restriction of motion. she describes a deep ache around the shoulder, and sometimes it will wake the patient up at night.  Left shoulder pain, down into the arm and down. Doing water aerobic a couple of months ago. Since then, putting ice on it or heating pad. Cannot reach around back. ? Holding something back in water, ? Did something and pain since then  Medications Tried: tylenol Ice or Heat: minimal help Tried PT: No  Prior shoulder Injury: No Prior surgery: No Prior fracture: No  Past Medical History, Surgical History, Social History, Family History, Medications, and allergies reviewed and updated if relevant.   GEN: No fevers, chills. Nontoxic. Primarily MSK c/o today. MSK: Detailed in the HPI GI: tolerating PO intake without difficulty Neuro: No numbness, parasthesias, or tingling associated. Otherwise the pertinent positives of the ROS are noted above.    Objective:   Blood pressure 118/70, pulse 96, temperature 98.1 F (36.7 C), temperature source Oral, height 5' 5.5" (1.664 m), weight 256 lb (116.121 kg), last menstrual period 07/27/1980, SpO2 94.00%.  GEN: Well-developed,well-nourished,in no acute  distress; alert,appropriate and cooperative throughout examination HEENT: Normocephalic and atraumatic without obvious abnormalities. Ears, externally no deformities PULM: Breathing comfortably in no respiratory distress EXT: No clubbing, cyanosis, or edema PSYCH: Normally interactive. Cooperative during the interview. Pleasant. Friendly and conversant. Not anxious or depressed appearing. Normal, full affect.  Shoulder: L Inspection: No muscle wasting or winging Ecchymosis/edema: neg  AC joint, scapula, clavicle: NT Cervical spine: NT, full ROM Spurling's: neg Abduction: 5/5, 155 Flexion: 5/5, 160 IR, full, lift-off: 5/5, 15 deg at 90 ER at neutral: 5/5, 35 at 90 AC crossover and compression: unable to complete Additional special testing is equivocal given lack of motion C5-T1 intact Sensation intact Grip 5/5  Assessment and Plan:   Frozen shoulder syndrome, left - Plan: Ambulatory referral to Physical Therapy  >25 minutes spent in face to face time with patient, >50% spent in counselling or coordination of care  Likely secondary Patient was given a systematic ROM protocol from [employer] to be done daily. Emphasized importance of adherence, help of PT, daily HEP.  The average length of total symptoms is 12 months going through 3 different phases in the freezing and thawing process. Reviewed all with patient.   Tylenol or tramadol prn for pain relief Intraarticular shoulder injections discussed with patient, which have good evidence for accelerating the thawing phase.  Patient will be sent for formal PT for aggressive frozen shoulder ROM. Will need RTC str and scapular stabilization to fix underlying mechanics.  Follow-up: Return in about 2 months (around 07/14/2014).  New Prescriptions   TRAMADOL (ULTRAM) 50 MG TABLET    Take 1 tablet (50 mg total) by mouth every 6 (six) hours as  needed.   Orders Placed This Encounter  Procedures  . Ambulatory referral to Physical Therapy      Signed,  Frederico Hamman T. Lessie Funderburke, MD   Patient's Medications  New Prescriptions   TRAMADOL (ULTRAM) 50 MG TABLET    Take 1 tablet (50 mg total) by mouth every 6 (six) hours as needed.  Previous Medications   ALBUTEROL (PROVENTIL HFA;VENTOLIN HFA) 108 (90 BASE) MCG/ACT INHALER    Inhale 2 puffs into the lungs every 4 (four) hours as needed for wheezing or shortness of breath.   ALENDRONATE (FOSAMAX) 70 MG TABLET    Take 70 mg by mouth every 7 (seven) days. Starting in august 2015//Take with a full glass of water on an empty stomach.   AMLODIPINE (NORVASC) 10 MG TABLET    Take 1 tablet (10 mg total) by mouth daily.   ASCORBIC ACID (VITAMIN C) 1000 MG TABLET    Take 1,000 mg by mouth daily.     ASPIRIN 81 MG TABLET    Take 81 mg by mouth daily.   BENZONATATE (TESSALON) 200 MG CAPSULE    Take 200 mg by mouth 3 (three) times daily as needed for cough.   CALCIUM-VITAMIN D (OSCAL WITH D 500-200) 500-200 MG-UNIT PER TABLET    Take 1 tablet by mouth daily.     CROMOLYN (NASALCROM) 5.2 MG/ACT NASAL SPRAY    Place 2 sprays into the nose daily.    EPINEPHRINE (EPI-PEN) 0.3 MG/0.3 ML DEVI    Inject 0.3 mLs (0.3 mg total) into the muscle once. As needed for allergic reaction   EZETIMIBE (ZETIA) 10 MG TABLET    Take 1 tablet (10 mg total) by mouth daily.   FUROSEMIDE (LASIX) 20 MG TABLET    Take 20 mg by mouth daily as needed for fluid.   NITROGLYCERIN (NITROSTAT) 0.4 MG SL TABLET    Place 0.4 mg under the tongue every 5 (five) minutes as needed for chest pain.    OMEPRAZOLE (PRILOSEC OTC) 20 MG TABLET    Take 1 tablet (20 mg total) by mouth daily.   RANITIDINE (ZANTAC) 150 MG TABLET    One at bedtime  Modified Medications   No medications on file  Discontinued Medications   No medications on file

## 2014-05-14 NOTE — Progress Notes (Signed)
Pre visit review using our clinic review tool, if applicable. No additional management support is needed unless otherwise documented below in the visit note. 

## 2014-05-16 ENCOUNTER — Ambulatory Visit (INDEPENDENT_AMBULATORY_CARE_PROVIDER_SITE_OTHER)
Admission: RE | Admit: 2014-05-16 | Discharge: 2014-05-16 | Disposition: A | Discharge status: Home / Self Care | Payer: Commercial Managed Care - HMO | Source: Ambulatory Visit | Attending: Internal Medicine | Admitting: Internal Medicine

## 2014-05-16 ENCOUNTER — Encounter: Payer: Self-pay | Admitting: Internal Medicine

## 2014-05-16 ENCOUNTER — Ambulatory Visit (INDEPENDENT_AMBULATORY_CARE_PROVIDER_SITE_OTHER): Payer: Commercial Managed Care - HMO | Admitting: Internal Medicine

## 2014-05-16 VITALS — BP 106/60 | HR 90 | Temp 97.6°F | Ht 65.5 in | Wt 263.0 lb

## 2014-05-16 DIAGNOSIS — R918 Other nonspecific abnormal finding of lung field: Secondary | ICD-10-CM

## 2014-05-16 DIAGNOSIS — R059 Cough, unspecified: Secondary | ICD-10-CM

## 2014-05-16 DIAGNOSIS — J9611 Chronic respiratory failure with hypoxia: Secondary | ICD-10-CM

## 2014-05-16 DIAGNOSIS — R05 Cough: Secondary | ICD-10-CM

## 2014-05-16 NOTE — Patient Instructions (Signed)
For drainage take chlortrimeton (chlorpheniramine) 4 mg every 4 hours available over the counter (may cause drowsiness)   Please schedule a follow up office visit in 6 weeks, call sooner if needed

## 2014-05-17 NOTE — Progress Notes (Signed)
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 Follow-up    Pt had PET scan done this am. Her cough and SOB have improved since the last visit.   saba inhaler helps some/ min mucoid sputum, temp to 99 but no rigors, no purulent sputum or hemoptysis/ does have some wt. Loss, no sweating.  rec Dulera 100 Take 2 puffs first thing in am and then another 2 puffs about 12 hours later.  Prednisone 10 mg take  4 each am x 2 days,   2 each am x 2 days,  1 each am x 2 days and stop Only use your albuterol as a rescue medication  We will arrange 24/7 02 at 3lpm per advanced  We will call to arrange a biopsy of the easiest area once I discuss this with radiology  Late add: Discussed in detail all the  indications, usual  risks and alternatives  relative to the benefits with patient who agrees to proceed with bronchoscopy with biopsy.   FOB > done 07/05/2013 > nl airways > neg afb/fungus/path  - IR Bx 07/11/2013 > inflammatory, special stains pending but does not look like tb or fungus and responds short course pred per pt > rec prednisone x 6 days only and needs VATS bx but declines as of 07/13/2013 > rec'd final path from [facility] review > c/w BOOP though can't exclude asp/infection sources     07/24/2013 f/u ov/Kelli Velasquez re: ? Nodular BOOP Chief Complaint  Patient presents with  . Followup with CXR    Pt states that her breathing is overall doing well. She states that she is able to more activity and feels stronger.   02 2lpm when ambulatory sats in mid 90's last dose of  prednisone sev weeks ago really helped sob and no worse since stopped.  No h/o arthralgias, rash, pet exposure. rec If condition worsens take pred x 6 days trial    09/06/2013 f/u ov/Kelli Velasquez re:  02 24/7 since Dec 2014  Chief Complaint  Patient presents with  . Follow-up    Pt states that her breathing is doing well and her cough has pretty much resolved. No new co's today. Has not had to use rescue inhaler.   Finished pred Aug 11 2013 and markedly better then a little worse since few days prior to OV   = more cough white mucus, no fever. rec Please remember to go to the lab and x-ray department downstairs for your tests - we will call you with the results when they are available. Prednisone 20 mg per day until your best then reduce to 10 mg per day x one week then one half daily as tolerated, increase back up if needed. Not really clear how much asthma present > ok to change dulera to prn    10/18/2013 f/u ov/Kelli Velasquez re: nodular boop Chief Complaint  Patient presents with  . Follow-up    Breathing improved, not having to wear O2 as much and SATS staying up  in 90's   Flared cough at 5 and resumed 20 then flared at 10 so resumed 20 and resolved again   11/29/2013 f/u ov/Kelli Velasquez re: nodular boop/ off 02 x at hs on 20 /10 pred s flare of cough/sob Chief Complaint  Patient presents with  . Follow-up    Pt states that she is doing well and denies any new co's today.   rec Prednisone 10 mg daily unless worse breathing or coughing then increase back to previous dose Ok to try Try prilosec 20mg   Take 30-60 min before first meal of the day and zantac 150  mg one bedtime       03/07/2014 f/u ov/Kelli Velasquez re: nodular boop / pred 10 mg daily  Chief Complaint  Patient presents with  . Follow-up    Pt states doing well and denies any co's today.   Not limited by breathing from desired activities  Except by the humdity   rec Taper pred off by around 04/09/14    04/18/2014 f/u ov/Kelli Velasquez re: nodular boop x 2  weeks off pred Chief Complaint  Patient presents with  . Follow-up    Pt stopped pred x 2 wks ago- since then c/o fatigue.    no cough, no nausea/fever/ sweats. Fatigue was bad the first week off pred and better the next  rec No change rx - leave off  pred   05/16/2014 f/u ov/Kelli Velasquez re: boop f/u off pred since 04/09/14  Chief Complaint  Patient presents with  . Follow-up    Fatigue is some better- has starte on b-12 inj per PCP.  Breathing is overall doing well. She c/o non prod cough on and off "it's my allergies".   cough is "nothing unusual" but did seem better on prednisone vs off. Has not tried otcs yet No sob  No obvious day to day or daytime variabilty or assoc excess or purulent mucus  or cp or chest tightness, subjective wheeze overt sinus or hb symptoms. No unusual exp hx or h/o childhood pna/ asthma or knowledge of premature birth.  Sleeping ok without nocturnal  or early am exacerbation  of respiratory  c/o's or need for noct saba. Also denies any obvious fluctuation of symptoms with weather or environmental changes or other aggravating or alleviating factors except as outlined above   Current Medications, Allergies, Complete Past Medical History, Past Surgical History, Family History, and Social History were reviewed in [employer] record.  ROS  The following are not active complaints unless bolded sore throat, dysphagia, dental problems, itching, sneezing,  nasal congestion or excess/ purulent secretions, ear ache,   fever, chills, sweats, unintended wt loss, pleuritic or exertional cp, hemoptysis,  orthopnea pnd or leg swelling, presyncope, palpitations, heartburn, abdominal pain, anorexia, nausea, vomiting, diarrhea  or change in bowel or urinary habits, change in stools or urine, dysuria,hematuria,  rash, arthralgias, visual complaints, headache, numbness weakness or ataxia or problems with walking or coordination,  change in mood/affect or memory.                  PMH:  -Hx of NSTEMI.07/2009 w/  cardiac cath s/p 2 stents in LAD/ RCA       >>Echo at that time showed Mild LVH, EF of 55% and grade 1 diastolic dysfuntion  -Hyperlipidemia -HTN  -OA -s/p bilat. Knee replacements - Obesity    SH:  Retired from [employer] work Former smoker -quit 1984 -smoked x 20 1 PPD  Married  No pets  No unusual hobbies  [place] on vacation frequently (1-10/2012 -traveled to [place], [place] and [place] )    [place] :  Stroke         Objective:   Physical Exam GEN: A/Ox3; pleasant , NAD, obese WF  / min cushingnoid   01/31/2013         237  > 07/03/2013  232 > 07/24/2013  231 > 09/06/2013  233 > 04/18/2014 256  10/18/2013  233 > 11/29/2013 238 >  03/07/2014  249 > 05/17/2014  263  Wt Readings from Last 3 Encounters:  12/16/12 246 lb (111.585 kg)  11/24/12 252 lb 9.6 oz (114.579 kg)  11/10/12 245 lb 8 oz (111.358 kg)     HEENT:  [place]/AT,  EACs-clear, TMs-wnl, NOSE-clear, THROAT-clear, no lesions, no postnasal drip or exudate noted.    Edentulous   NECK:  Supple w/ fair ROM; no JVD; normal carotid impulses w/o bruits; no thyromegaly or nodules palpated; no lymphadenopathy.  RESP    slt decreased bs bilaterally, no insp crackles or exp rhonchi  CARD:  RRR, no m/r/g  , trace bilateral lower ext pitting edema, pulses intact, no cyanosis or clubbing., neg homans sign   GI:   Soft & nt; nml bowel sounds; no organomegaly or masses detected.  Musco: Warm bil, no deformities or joint swelling noted.  Bilateral knee scars       cxr 03/07/14   No active lung disease. Stable cardiomegaly.          Assessment & Plan:

## 2014-05-17 NOTE — Assessment & Plan Note (Signed)
-   improved on 1st gen H1 12/16/12  Has not yet tried otcs as prev rec

## 2014-05-17 NOTE — Assessment & Plan Note (Signed)
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 > discussed with IR, rec FOB > done 07/05/2013 > nl airways > neg afb/fungus/path  - IR Bx 07/11/2013 > inflammatory, special stains pending but does not look like tb or fungus and responds short course pred per pt > rec prednisone x 6 days only and needs VATS bx but declines as of 07/13/2013 > rec'd final path from [facility] review > c/w BOOP though can't exclude asp/infection sources - 07/19/2013 notified pt and will return 07/24/13 for cxr/ esr then consider steroid rx (needs crypto serology also) - 07/24/2013 ESR 42 off pred x 3 weeks - 07/24/13 > crypto serology neg/ Anti-CCP neg  - started maint   prednisone 09/06/2013  >>> cxr cleared 10/18/2013 on 20 mg per day > rec taper to 20/10 - 11/28/13 reduced to 10 mg daily  - 03/07/2014 5 mg x 2 weeks then 5 mg qod x 2 weeks and stopped pred  04/10/14   The goal with a chronic steroid dependent illness is always arriving at the lowest effective dose that controls the disease/symptoms and not accepting a set "formula" which is based on statistics or guidelines that don't always take into account patient  variability or the natural hx of the dz in every individual patient, which may well vary over time.  For now therefore I recommend the patient maintain  Off prednisone completely for now.

## 2014-05-17 NOTE — Assessment & Plan Note (Addendum)
11/24/2012 Office walk showed desats 86% on RA  With  spirometry FEV1 nml-73% , ratio 80   11/25/2012 CTA Chest >>neg for PE/ ILD  12/07/12 ONO > Pos 5: 84min  begin O2 2 l/m At bedtime  12/13/2012  >  Repeat 02/01/13 on 2lpm 40min> no change rx  - 01/31/2013  Walked RA x 3 laps @ 185 ft each stopped due to  End of study, no desats  - 07/03/2013 sats 75% RA >  On 3lpm sats 93%  > rx with 24 h 02 at 3lpm   - improved by self monitoring as of 07/24/13 ov so changed rx to 2lpm/24/7 humidified due to nasal dryness - 09/06/2013  Walked 2lpm x  2 laps @ 185 ft each stopped due to legs tired, no desats    - 04/18/2014  Walked RA x 2 laps @ 185 ft each stopped due to  Hip pain, slow pace, sats 89% at end  - 05/16/2014  [facility] RA @ mod pace  2 laps @ 185 ft each stopped due to sob/fatigue but no desats off pred x 04/10/14   As of 05/17/2014 02 2lpm at hs only

## 2014-05-18 ENCOUNTER — Ambulatory Visit (INDEPENDENT_AMBULATORY_CARE_PROVIDER_SITE_OTHER): Payer: Commercial Managed Care - HMO | Admitting: *Deleted

## 2014-05-18 DIAGNOSIS — E538 Deficiency of other specified B group vitamins: Secondary | ICD-10-CM

## 2014-05-18 MED ORDER — CYANOCOBALAMIN 1000 MCG/ML IJ SOLN
1000.0000 ug | Freq: Once | INTRAMUSCULAR | Status: AC
  Administered 2014-05-18: 1000 ug via INTRAMUSCULAR

## 2014-05-25 ENCOUNTER — Ambulatory Visit (INDEPENDENT_AMBULATORY_CARE_PROVIDER_SITE_OTHER): Payer: Commercial Managed Care - HMO | Admitting: *Deleted

## 2014-05-25 DIAGNOSIS — E538 Deficiency of other specified B group vitamins: Secondary | ICD-10-CM

## 2014-05-25 MED ORDER — CYANOCOBALAMIN 1000 MCG/ML IJ SOLN
1000.0000 ug | Freq: Once | INTRAMUSCULAR | Status: AC
  Administered 2014-05-25: 1000 ug via INTRAMUSCULAR

## 2014-06-01 ENCOUNTER — Other Ambulatory Visit: Payer: Self-pay | Admitting: Family Medicine

## 2014-06-01 ENCOUNTER — Ambulatory Visit (INDEPENDENT_AMBULATORY_CARE_PROVIDER_SITE_OTHER): Payer: Commercial Managed Care - HMO | Admitting: *Deleted

## 2014-06-01 DIAGNOSIS — E538 Deficiency of other specified B group vitamins: Secondary | ICD-10-CM

## 2014-06-01 MED ORDER — CYANOCOBALAMIN 1000 MCG/ML IJ SOLN
1000.0000 ug | Freq: Once | INTRAMUSCULAR | Status: AC
  Administered 2014-06-01: 1000 ug via INTRAMUSCULAR

## 2014-06-07 ENCOUNTER — Other Ambulatory Visit: Payer: Self-pay | Admitting: Family Medicine

## 2014-06-08 ENCOUNTER — Other Ambulatory Visit (INDEPENDENT_AMBULATORY_CARE_PROVIDER_SITE_OTHER): Payer: Commercial Managed Care - HMO

## 2014-06-08 DIAGNOSIS — E538 Deficiency of other specified B group vitamins: Secondary | ICD-10-CM

## 2014-06-08 LAB — VITAMIN B12: Vitamin B-12: 712 pg/mL (ref 211–911)

## 2014-06-21 ENCOUNTER — Other Ambulatory Visit: Payer: Self-pay | Admitting: Family Medicine

## 2014-06-26 ENCOUNTER — Ambulatory Visit (INDEPENDENT_AMBULATORY_CARE_PROVIDER_SITE_OTHER): Payer: Commercial Managed Care - HMO | Admitting: Internal Medicine

## 2014-06-26 ENCOUNTER — Encounter: Payer: Self-pay | Admitting: Internal Medicine

## 2014-06-26 VITALS — BP 122/86 | HR 88 | Ht 65.0 in | Wt 261.0 lb

## 2014-06-26 DIAGNOSIS — R918 Other nonspecific abnormal finding of lung field: Secondary | ICD-10-CM

## 2014-06-26 DIAGNOSIS — J45991 Cough variant asthma: Secondary | ICD-10-CM

## 2014-06-26 MED ORDER — BECLOMETHASONE DIPROPIONATE 80 MCG/ACT IN AERS: INHALATION_SPRAY | RESPIRATORY_TRACT | Status: DC

## 2014-06-26 NOTE — Patient Instructions (Addendum)
qvar 80 Take 2 puffs first thing in am and then another 2 puffs about 12 hours later and if improve on this ok to reduce to one twice daily   Work on inhaler technique:  relax and gently blow all the way out then take a nice smooth deep breath back in, triggering the inhaler at same time you start breathing in.  Hold for up to 5 seconds if you can.  Rinse and gargle with water when done  [facility] to use the cough medicine and albuterol as needed   Would avoid fosfamax for now with alternative being reclast one injection yearly   Please schedule a follow up visit in 3 months but call sooner if needed

## 2014-06-26 NOTE — Progress Notes (Signed)
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 Follow-up    Pt had PET scan done this am. Her cough and SOB have improved since the last visit.   saba inhaler helps some/ min mucoid sputum, temp to 99 but no rigors, no purulent sputum or hemoptysis/ does have some wt. Loss, no sweating.  rec Dulera 100 Take 2 puffs first thing in am and then another 2 puffs about 12 hours later.  Prednisone 10 mg take  4 each am x 2 days,   2 each am x 2 days,  1 each am x 2 days and stop Only use your albuterol as a rescue medication  We will arrange 24/7 02 at 3lpm per advanced  We will call to arrange a biopsy of the easiest area once I discuss this with radiology  Late add: Discussed in detail all the  indications, usual  risks and alternatives  relative to the benefits with patient who agrees to proceed with bronchoscopy with biopsy.   FOB > done 07/05/2013 > nl airways > neg afb/fungus/path  - IR Bx 07/11/2013 > inflammatory, special stains pending but does not look like tb or fungus and responds short course pred per pt > rec prednisone x 6 days only and needs VATS bx but declines as of 07/13/2013 > rec'd final path from [place] review > c/w BOOP though can't exclude asp/infection sources     07/24/2013 f/u ov/Kelli Velasquez  re: ? Nodular BOOP Chief Complaint  Patient presents with  . Followup with CXR    Pt states that her breathing is overall doing well. She states that she is able to more activity and feels stronger.   02 2lpm when ambulatory sats in mid 90's last dose of  prednisone sev weeks ago really helped sob and no worse since stopped.  No h/o arthralgias, rash, pet exposure. rec If condition worsens take pred x 6 days trial    09/06/2013 f/u ov/Kelli Velasquez re:  02 24/7 since Dec 2014  Chief Complaint  Patient presents with  . Follow-up    Pt states that her breathing is doing well and her cough has pretty much resolved. No new co's today. Has not had to use rescue inhaler.   Finished pred Aug 11 2013 and markedly better then a little worse since few days prior to OV   = more cough white mucus, no fever. rec Please remember to go to the lab and x-ray department downstairs for your tests - we will call you with the results when they are available. Prednisone 20 mg per day until your best then reduce to 10 mg per day x one week then one half daily as tolerated, increase back up if needed. Not really clear how much asthma present > ok to change dulera to prn    10/18/2013 f/u ov/Kelli Velasquez re: nodular boop Chief Complaint  Patient presents with  . Follow-up    Breathing improved, not having to wear O2 as much and SATS staying up  in 90's   Flared cough at 5 and resumed 20 then flared at 10 so resumed 20 and resolved again   11/29/2013 f/u ov/Kelli Velasquez re: nodular boop/ off 02 x at hs on 20 /10 pred s flare of cough/sob Chief Complaint  Patient presents with  . Follow-up    Pt states that she is doing well and denies any new co's today.   rec Prednisone 10 mg daily unless worse breathing or coughing then increase back to previous dose Ok to try Try prilosec 20mg   Take 30-60 min before first meal of the day and zantac 150  mg one bedtime       03/07/2014 f/u ov/Kelli Velasquez re: nodular boop / pred 10 mg daily  Chief Complaint  Patient presents with  . Follow-up    Pt states doing well and denies any co's today.   Not limited by breathing from desired activities  Except by the humdity   rec Taper pred off by around 04/09/14    04/18/2014 f/u ov/Kelli Velasquez re: nodular boop x 2  weeks off pred Chief Complaint  Patient presents with  . Follow-up    Pt stopped pred x 2 wks ago- since then c/o fatigue.    no cough, no nausea/fever/ sweats. Fatigue was bad the first week off pred and better the next  rec No change rx - leave off  pred   05/16/2014 f/u ov/Kelli Velasquez re: boop f/u off pred since 04/09/14  Chief Complaint  Patient presents with  . Follow-up    Fatigue is some better- has starte on b-12 inj per PCP.  Breathing is overall doing well. She c/o non prod cough on and off "it's my allergies".   cough is "nothing unusual" but did seem better on prednisone vs off. Has not tried otcs yet No sob rec For drainage take chlortrimeton (chlorpheniramine) 4 mg every 4 hours available over the counter (may cause drowsiness)    06/26/2014 f/u ov/Kelli Velasquez re: f/u boop  Off pred since 04/09/14  Chief Complaint  Patient presents with  . Follow-up    productive cough white   does not think she coughed much while on prednisone but back to baseline chronic cough once stopped it   Albuterol helps some  Cough flares with rain any time of year  Tends to occur also at hs despite two h1 1hs and pepcid at hs but total amt of mucus is very small Not limited by breathing from desired activities      No obvious day to day or daytime variabilty or assoc   cp or chest tightness, subjective wheeze overt sinus or hb symptoms. No unusual exp hx or h/o childhood pna/ asthma or knowledge of premature birth.  Sleeping ok without nocturnal  or early am exacerbation  of respiratory  c/o's or need for noct saba. Also denies any obvious fluctuation of symptoms with weather or environmental changes or other aggravating or alleviating factors except as outlined above   Current Medications, Allergies, Complete Past Medical History, Past Surgical History, Family History, and Social History were reviewed in [facility] record.  ROS  The following are not active complaints unless  bolded sore throat, dysphagia, dental problems, itching, sneezing,  nasal congestion or excess/ purulent secretions, ear ache,   fever, chills, sweats, unintended wt loss, pleuritic or exertional cp, hemoptysis,  orthopnea pnd or leg swelling, presyncope, palpitations, heartburn, abdominal pain, anorexia, nausea, vomiting, diarrhea  or change in bowel or urinary habits, change in stools  or urine, dysuria,hematuria,  rash, arthralgias, visual complaints, headache, numbness weakness or ataxia or problems with walking or coordination,  change in mood/affect or memory.                 PMH:  -Hx of NSTEMI.07/2009 w/  cardiac cath s/p 2 stents in LAD/ RCA       >>Echo at that time showed Mild LVH, EF of 55% and grade 1 diastolic dysfuntion  -Hyperlipidemia -HTN  -OA -s/p bilat. Knee replacements - Obesity    SH:  Retired from [employer] work Former smoker -quit 1984 -smoked x 20 1 PPD  Married  No pets  No unusual hobbies  [place] on vacation frequently (1-10/2012 -traveled to [place], [place] and [place] )    [place] :  Stroke         Objective:   Physical Exam GEN: A/Ox3; pleasant , NAD, obese WF  / min cushingnoid   01/31/2013         237  > 07/03/2013  232 > 07/24/2013  231 > 09/06/2013  233 > 04/18/2014 256  10/18/2013  233 > 11/29/2013 238 >  03/07/2014  249 > 05/17/2014  263 > 06/26/2014 261 Wt Readings from Last 3 Encounters:  12/16/12 246 lb (111.585 kg)  11/24/12 252 lb 9.6 oz (114.579 kg)  11/10/12 245 lb 8 oz (111.358 kg)     HEENT:  [employer]/AT,  EACs-clear, TMs-wnl, NOSE-clear, THROAT-clear, no lesions, no postnasal drip or exudate noted.    Edentulous   NECK:  Supple w/ fair ROM; no JVD; normal carotid impulses w/o bruits; no thyromegaly or nodules palpated; no lymphadenopathy.  RESP    slt decreased bs bilaterally, no insp crackles or exp rhonchi  CARD:  RRR, no m/r/g  , trace bilateral lower ext pitting edema, pulses intact, no cyanosis or clubbing., neg homans sign   GI:   Soft & nt;  nml bowel sounds; no organomegaly or masses detected.  Musco: Warm bil, no deformities or joint swelling noted.  Bilateral knee scars       cxr 05/16/14 Low lung volumes with mild atelectasis at the bases. No definite recurrence of bilateral airspace disease.          Assessment & Plan:

## 2014-06-28 NOTE — Assessment & Plan Note (Signed)
-   PFT's 01/31/2013 wnl x low fef25-75 and 12% better fev1 p B2   DDX of  difficult airways management all start with A and  include Adherence, Ace Inhibitors, Acid Reflux, Active Sinus Disease, Alpha 1 Antitripsin deficiency, Anxiety masquerading as Airways dz,  ABPA,  allergy(esp in young), Aspiration (esp in elderly), Adverse effects of DPI,  Active smokers, plus two Bs  = Bronchiectasis and Beta blocker use..and one C= CHF  Adherence is always the initial "prime suspect" and is a multilayered concern that requires a "trust but verify" approach in every patient - starting with knowing how to use medications, especially inhalers, correctly, keeping up with refills and understanding the fundamental difference between maintenance and prns vs those medications only taken for a very short course and then stopped and not refilled.  The proper method of use, as well as anticipated side effects, of a metered-dose inhaler are discussed and demonstrated to the patient. Improved effectiveness after extensive coaching during this visit to a level of approximately  75% so try qvar 80 2bid   ? Acid (or non-acid) GERD > always difficult to exclude as up to 75% of pts in some series report no assoc GI/ Heartburn symptoms> rec continue max (24h)  acid suppression and diet restrictions/ reviewed   And need to leave off fosfamax for now    Each maintenance medication was reviewed in detail including most importantly the difference between maintenance and as needed and under what circumstances the prns are to be used.  Please see instructions for details which were reviewed in writing and the patient given a copy.

## 2014-06-28 NOTE — Assessment & Plan Note (Signed)
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 > discussed with IR, rec FOB > done 07/05/2013 > nl airways > neg afb/fungus/path  - IR Bx 07/11/2013 > inflammatory, special stains pending but does not look like tb or fungus and responds short course pred per pt > rec prednisone x 6 days only and needs VATS bx but declines as of 07/13/2013 > rec'd final path from [facility] review > c/w BOOP though can't exclude asp/infection sources - 07/19/2013 notified pt and will return 07/24/13 for cxr/ esr then consider steroid rx (needs crypto serology also) - 07/24/2013 ESR 42 off pred x 3 weeks - 07/24/13 > crypto serology neg/ Anti-CCP neg  - started maint   prednisone 09/06/2013  >>> cxr cleared 10/18/2013 on 20 mg per day > rec taper to 20/10 - 11/28/13 reduced to 10 mg daily  - 03/07/2014 5 mg x 2 weeks then 5 mg qod x 2 weeks and stopped pred  04/10/14   No evidence of recrrence to date    

## 2014-06-29 ENCOUNTER — Other Ambulatory Visit: Payer: Self-pay | Admitting: Family Medicine

## 2014-07-03 ENCOUNTER — Telehealth: Payer: Self-pay | Admitting: Family Medicine

## 2014-07-03 ENCOUNTER — Ambulatory Visit (INDEPENDENT_AMBULATORY_CARE_PROVIDER_SITE_OTHER): Payer: Commercial Managed Care - HMO | Admitting: *Deleted

## 2014-07-03 DIAGNOSIS — D519 Vitamin B12 deficiency anemia, unspecified: Secondary | ICD-10-CM

## 2014-07-03 MED ORDER — CYANOCOBALAMIN 1000 MCG/ML IJ SOLN
1000.0000 ug | Freq: Once | INTRAMUSCULAR | Status: AC
  Administered 2014-07-03: 1000 ug via INTRAMUSCULAR

## 2014-07-03 NOTE — Telephone Encounter (Signed)
Pt had B12 inj today, she said she is going to [place] on 12/31 and will be gone for 2 months. She's not sure what to do about b12 inj. Can you write an rx, and give her an order to take somewhere to have while there? She thinks she may be able to have it done at a pharmacy, since they do immunizations.

## 2014-07-03 NOTE — Telephone Encounter (Signed)
Pt called, wanted to let Dr. Glori Bickers know she is taking [employer] 1000 mg B-12 Supplement.  She is also taking antacids and was wondering if this would interfere with absorption and if she should take the dissolving tablets.  Best number to call pt is [phone]

## 2014-07-03 NOTE — Telephone Encounter (Signed)
I put px in IN box  Try not to take the B12 at the same time as her antacid-space it out by at least 2  Hours

## 2014-07-05 NOTE — Telephone Encounter (Signed)
Pt notified of Dr. [facility]'s comments/ recommendations  

## 2014-09-18 ENCOUNTER — Telehealth: Payer: Self-pay | Admitting: Internal Medicine

## 2014-09-18 NOTE — Telephone Encounter (Signed)
Yes I was planning to do this anyway

## 2014-09-18 NOTE — Telephone Encounter (Signed)
Pt aware. Nothing further needed 

## 2014-09-18 NOTE — Telephone Encounter (Signed)
MW please advise if pt will need cxr at her next ov on 10/01/14.  i did not see mention of this in the last OV notes.  Thanks  Allergies  Allergen Reactions  . Shellfish Allergy Anaphylaxis  . Aspirin     REACTION: nausea and vomiting High doses  . Atorvastatin     REACTION: leg pain  . Crestor [Rosuvastatin Calcium] Other (See Comments)    Muscle pain - allergy/intolerance  . Fish Allergy   . Simvastatin     REACTION: muscle pain  . Trandolapril     REACTION: leg pain    Current Outpatient Prescriptions on File Prior to Visit  Medication Sig Dispense Refill  . albuterol (PROVENTIL HFA;VENTOLIN HFA) 108 (90 BASE) MCG/ACT inhaler Inhale 2 puffs into the lungs every 4 (four) hours as needed for wheezing or shortness of breath. 1 Inhaler 5  . alendronate (FOSAMAX) 70 MG tablet Take 70 mg by mouth every 7 (seven) days. Starting in august 2015//Take with a full glass of water on an empty stomach.    Marland Kitchen amLODipine (NORVASC) 10 MG tablet TAKE 1 TABLET BY MOUTH DAILY. 30 tablet 5  . Ascorbic Acid (VITAMIN C) 1000 MG tablet Take 1,000 mg by mouth daily.      Marland Kitchen aspirin 81 MG tablet Take 81 mg by mouth daily.    . beclomethasone (QVAR) 80 MCG/ACT inhaler Take 2 puffs first thing in am and then another 2 puffs about 12 hours later. 1 Inhaler 11  . calcium-vitamin D (OSCAL WITH D 500-200) 500-200 MG-UNIT per tablet Take 1 tablet by mouth daily.      . cromolyn (NASALCROM) 5.2 MG/ACT nasal spray Place 2 sprays into the nose daily.     Marland Kitchen EPINEPHrine (EPI-PEN) 0.3 mg/0.3 mL DEVI Inject 0.3 mLs (0.3 mg total) into the muscle once. As needed for allergic reaction 1 Device 3  . furosemide (LASIX) 20 MG tablet TAKE 1 TABLET BY MOUTH EVERY DAY AS NEEDED 30 tablet 5  . nitroGLYCERIN (NITROSTAT) 0.4 MG SL tablet Place 0.4 mg under the tongue every 5 (five) minutes as needed for chest pain.     Marland Kitchen omeprazole (PRILOSEC OTC) 20 MG tablet Take 1 tablet (20 mg total) by mouth daily.    . ranitidine (ZANTAC) 150 MG  tablet One at bedtime    . traMADol (ULTRAM) 50 MG tablet Take 1 tablet (50 mg total) by mouth every 6 (six) hours as needed. (Patient not taking: Reported on 06/26/2014) 50 tablet 2  . UNABLE TO FIND Med Name: b-12 injections per Dr. Glori Bickers    . ZETIA 10 MG tablet TAKE 1 TABLET (10 MG TOTAL) BY MOUTH DAILY. 30 tablet 5   No current facility-administered medications on file prior to visit.

## 2014-10-01 ENCOUNTER — Encounter: Payer: Self-pay | Admitting: Internal Medicine

## 2014-10-01 ENCOUNTER — Ambulatory Visit (INDEPENDENT_AMBULATORY_CARE_PROVIDER_SITE_OTHER): Payer: Commercial Managed Care - HMO | Admitting: Internal Medicine

## 2014-10-01 ENCOUNTER — Ambulatory Visit (INDEPENDENT_AMBULATORY_CARE_PROVIDER_SITE_OTHER)
Admission: RE | Admit: 2014-10-01 | Discharge: 2014-10-01 | Disposition: A | Discharge status: Home / Self Care | Payer: Commercial Managed Care - HMO | Source: Ambulatory Visit | Attending: Internal Medicine | Admitting: Internal Medicine

## 2014-10-01 VITALS — BP 142/70 | HR 96 | Ht 65.5 in | Wt 247.0 lb

## 2014-10-01 DIAGNOSIS — R918 Other nonspecific abnormal finding of lung field: Secondary | ICD-10-CM

## 2014-10-01 DIAGNOSIS — J45991 Cough variant asthma: Secondary | ICD-10-CM

## 2014-10-01 DIAGNOSIS — J9611 Chronic respiratory failure with hypoxia: Secondary | ICD-10-CM

## 2014-10-01 NOTE — Progress Notes (Signed)
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 Follow-up    Pt had PET scan done this am. Her cough and SOB have improved since the last visit.   saba inhaler helps some/ min mucoid sputum, temp to 99 but no rigors, no purulent sputum or hemoptysis/ does have some wt. Loss, no sweating.  rec Dulera 100 Take 2 puffs first thing in am and then another 2 puffs about 12 hours later.  Prednisone 10 mg take  4 each am x 2 days,   2 each am x 2 days,  1 each am x 2 days and stop Only use your albuterol as a rescue medication  We will arrange 24/7 02 at 3lpm per advanced  We will call to arrange a biopsy of the easiest area once I discuss this with radiology  Late add: Discussed in detail all the  indications, usual  risks and alternatives  relative to the benefits with patient who agrees to proceed with bronchoscopy with biopsy.   FOB > done 07/05/2013 > nl airways > neg afb/fungus/path  - IR Bx 07/11/2013 > inflammatory, special stains pending but does not look like tb or fungus and responds short course pred per pt > rec prednisone x 6 days only and needs VATS bx but declines as of 07/13/2013 > rec'd final path from [facility] review > c/w BOOP though can't exclude asp/infection sources     07/24/2013 f/u ov/Kelli Velasquez re: ? Nodular BOOP Chief Complaint  Patient presents with  . Followup with CXR    Pt states that her breathing is overall doing well. She states that she is able to more activity and feels stronger.   02 2lpm when ambulatory sats in mid  90's last dose of prednisone sev weeks ago really helped sob and no worse since stopped.  No h/o arthralgias, rash, pet exposure. rec If condition worsens take pred x 6 days trial    09/06/2013 f/u ov/Kelli Velasquez re:  02 24/7 since Dec 2014  Chief Complaint  Patient presents with  . Follow-up    Pt states that her breathing is doing well and her cough has pretty much resolved. No new co's today. Has not had to use rescue inhaler.   Finished pred Aug 11 2013 and markedly better then a little worse since few days prior to OV   = more cough white mucus, no fever. rec Please remember to go to the lab and x-ray department downstairs for your tests - we will call you with the results when they are available. Prednisone 20 mg per day until your best then reduce to 10 mg per day x one week then one half daily as tolerated, increase back up if needed. Not really clear how much asthma present > ok to change dulera to prn      03/07/2014 f/u ov/Kelli Velasquez re: nodular boop / pred 10 mg daily  Chief Complaint  Patient presents with  . Follow-up    Pt states  doing well and denies any co's today.   Not limited by breathing from desired activities  Except by the humdity   rec Taper pred off by around 04/09/14    04/18/2014 f/u ov/Kelli Velasquez re: nodular boop x 2 weeks off pred Chief Complaint  Patient presents with  . Follow-up    Pt stopped pred x 2 wks ago- since then c/o fatigue.    no cough, no nausea/fever/ sweats. Fatigue was bad the first week off pred and better the next  rec No change rx - leave off  pred   06/26/2014 f/u ov/Kelli Velasquez re: f/u boop  Off pred since 04/09/14  Chief Complaint  Patient presents with  . Follow-up    productive cough white   does not think she coughed much while on prednisone but back to baseline chronic cough once stopped it   Albuterol helps some  Cough flares with rain any time of year  Tends to occur also at hs despite two h1  At hs  and pepcid at hs but total amt of mucus is  very small  rec qvar 80 Take 2 puffs first thing in am and then another 2 puffs about 12 hours later and if improve on this ok to reduce to one twice daily  Work on inhaler technique:  Ok to use the cough medicine and albuterol as needed  Would avoid fosfamax for now with alternative being reclast one injection yearly    10/01/2014 f/u ov/Kelli Velasquez re: boop/ asthma  on qvar 80 2 bid and very rare saba Chief Complaint  Patient presents with  . Follow-up    CXR done today. Pt states her breathing is doing well overall with minimal cough.      No obvious day to day or daytime variabilty or assocsob cp or chest tightness, subjective wheeze overt sinus or hb symptoms. No unusual exp hx or h/o childhood pna/ asthma or knowledge of premature birth.  Sleeping ok without nocturnal  or early am exacerbation  of respiratory  c/o's or need for noct saba. Also denies any obvious fluctuation of symptoms with weather or environmental changes or other aggravating or alleviating factors except as outlined above   Current Medications, Allergies, Complete Past Medical History, Past Surgical History, Family History, and Social History were reviewed in [facility] record.  ROS  The following are not active complaints unless bolded sore throat, dysphagia, dental problems, itching, sneezing,  nasal congestion or excess/ purulent secretions, ear ache,   fever, chills, sweats, unintended wt loss, pleuritic or exertional cp, hemoptysis,  orthopnea pnd or leg swelling, presyncope, palpitations, heartburn, abdominal pain, anorexia, nausea, vomiting, diarrhea  or change in bowel or urinary habits, change in stools or urine, dysuria,hematuria,  rash, arthralgias, visual complaints, headache, numbness weakness or ataxia or problems with walking or coordination,  change in mood/affect or memory.           PMH:  -Hx of NSTEMI.07/2009 w/  cardiac cath s/p 2 stents in LAD/ RCA       >>Echo at that time  showed Mild LVH, EF of 55% and grade 1 diastolic dysfuntion  -Hyperlipidemia -HTN  -OA -s/p bilat. Knee replacements - Obesity    SH:  Retired from [employer] work Former smoker -quit 1984 -smoked x 20 1 PPD  Married  No pets  No unusual hobbies  [place] on vacation frequently (1-10/2012 -traveled to [place], [place] and [place] )    [place] :  Stroke  Objective:   Physical Exam GEN: A/Ox3; pleasant , NAD, obese WF  /   01/31/2013   237  > 07/03/2013  232 > 07/24/2013  231 > 09/06/2013  233 > 04/18/2014 256  10/18/2013  233 > 11/29/2013 238 >  03/07/2014  249 > 05/17/2014  263 > 06/26/2014 261>   10/01/2014  247  Wt Readings from Last 3 Encounters:  12/16/12 246 lb (111.585 kg)  11/24/12 252 lb 9.6 oz (114.579 kg)  11/10/12 245 lb 8 oz (111.358 kg)     HEENT:  [place]/AT,  EACs-clear, TMs-wnl, NOSE-clear, THROAT-clear, no lesions, no postnasal drip or exudate noted.    Edentulous   NECK:  Supple w/ fair ROM; no JVD; normal carotid impulses w/o bruits; no thyromegaly or nodules palpated; no lymphadenopathy.  RESP    slt decreased bs bilaterally, no insp crackles or exp rhonchi  CARD:  RRR, no m/r/g  , trace bilateral lower ext  edema, pulses intact, no cyanosis or clubbing., neg homans sign   GI:   Soft & nt; nml bowel sounds; no organomegaly or masses detected.  Musco: Warm bil, no deformities or joint swelling noted.  Bilateral knee scars       cxr 05/16/14 Low lung volumes with mild atelectasis at the bases. No definite recurrence of bilateral airspace disease.          Assessment & Plan:

## 2014-10-01 NOTE — Patient Instructions (Addendum)
Try qvar 80 one twice daily   Please see patient coordinator before you leave today  to schedule overnight oximetry to see if ok to stop your 02   Please schedule a follow up visit in 3 months but call sooner if needed

## 2014-10-05 ENCOUNTER — Encounter: Payer: Self-pay | Admitting: Internal Medicine

## 2014-10-05 MED ORDER — BECLOMETHASONE DIPROPIONATE 80 MCG/ACT IN AERS: INHALATION_SPRAY | RESPIRATORY_TRACT | Status: DC

## 2014-10-05 NOTE — Assessment & Plan Note (Signed)
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 > discussed with IR, rec FOB > done 07/05/2013 > nl airways > neg afb/fungus/path  - IR Bx 07/11/2013 > inflammatory, special stains pending but does not look like tb or fungus and responds short course pred per pt > rec prednisone x 6 days only and needs VATS bx but declines as of 07/13/2013 > rec'd final path from [facility] review > c/w BOOP though can't exclude asp/infection sources - 07/19/2013 notified pt and will return 07/24/13 for cxr/ esr then consider steroid rx (needs crypto serology also) - 07/24/2013 ESR 42 off pred x 3 weeks - 07/24/13 > crypto serology neg/ Anti-CCP neg  - started maint   prednisone 09/06/2013  >>> cxr cleared 10/18/2013 on 20 mg per day > rec taper to 20/10 - 11/28/13 reduced to 10 mg daily  - 03/07/2014 5 mg x 2 weeks then 5 mg qod x 2 weeks and stopped pred  04/10/14    No evidence of recurrence now 6 m p stopped prednisone so no need for further f/u cxr's

## 2014-10-05 NOTE — Assessment & Plan Note (Signed)
-  PFT's 01/31/2013 wnl x low fef25-75 and 12% better fev1 p B2 - 06/26/2014 p extensive coaching HFA effectiveness =    75% . Start qvar 80 2bid   All goals of chronic asthma control met including optimal function and elimination of symptoms with minimal need for rescue therapy.  Contingencies discussed in full including contacting this office immediately if not controlling the symptoms using the rule of two's.     Try step down to qvar 80 one bid

## 2014-10-05 NOTE — Assessment & Plan Note (Signed)
Followed by [facility] -Kelli Velasquez (Initial consult -11/24/2012 )     11/24/2012 Office walk showed desats 86% on RA  With  spirometry FEV1 nml-73% , ratio 80   11/25/2012 CTA Chest >>neg for PE/ ILD  12/07/12 ONO > Pos 5: 13min  begin O2 2 l/m At bedtime  12/13/2012  >  Repeat 02/01/13 on 2lpm 69min> no change rx  - 01/31/2013  Walked RA x 3 laps @ 185 ft each stopped due to  End of study, no desats  - 07/03/2013 sats 75% RA >  On 3lpm sats 93%  > rx with 24 h 02 at 3lpm   - improved by self monitoring as of 07/24/13 ov so changed rx to 2lpm/24/7 humidified due to nasal dryness - 09/06/2013  Walked 2lpm x  2 laps @ 185 ft each stopped due to legs tired, no desats    - 04/18/2014  Walked RA  @ mod pace x 2 laps @ 185 ft each stopped due to  Hip pain, slow pace, sats 89% at end  - 05/16/2014  [facility] RA  2 laps @ 185 ft each stopped due to sob/fatigue but no desats off pred x 04/10/14     As of 3/7/116 >>>  02 2lpm at hs only > rec ono RA to see if still needs it

## 2014-10-09 ENCOUNTER — Ambulatory Visit: Payer: Commercial Managed Care - HMO

## 2014-10-09 ENCOUNTER — Encounter: Payer: Self-pay | Admitting: Family Medicine

## 2014-10-09 ENCOUNTER — Ambulatory Visit (INDEPENDENT_AMBULATORY_CARE_PROVIDER_SITE_OTHER): Payer: Commercial Managed Care - HMO | Admitting: Family Medicine

## 2014-10-09 ENCOUNTER — Telehealth: Payer: Self-pay | Admitting: Family Medicine

## 2014-10-09 VITALS — BP 110/72 | HR 89 | Temp 97.5°F | Ht 65.5 in | Wt 248.0 lb

## 2014-10-09 DIAGNOSIS — L989 Disorder of the skin and subcutaneous tissue, unspecified: Secondary | ICD-10-CM

## 2014-10-09 DIAGNOSIS — E785 Hyperlipidemia, unspecified: Secondary | ICD-10-CM

## 2014-10-09 DIAGNOSIS — H269 Unspecified cataract: Secondary | ICD-10-CM | POA: Insufficient documentation

## 2014-10-09 DIAGNOSIS — Z23 Encounter for immunization: Secondary | ICD-10-CM

## 2014-10-09 DIAGNOSIS — E538 Deficiency of other specified B group vitamins: Secondary | ICD-10-CM

## 2014-10-09 DIAGNOSIS — N318 Other neuromuscular dysfunction of bladder: Secondary | ICD-10-CM

## 2014-10-09 DIAGNOSIS — H04123 Dry eye syndrome of bilateral lacrimal glands: Secondary | ICD-10-CM | POA: Insufficient documentation

## 2014-10-09 DIAGNOSIS — N3946 Mixed incontinence: Secondary | ICD-10-CM

## 2014-10-09 DIAGNOSIS — R600 Localized edema: Secondary | ICD-10-CM

## 2014-10-09 DIAGNOSIS — K219 Gastro-esophageal reflux disease without esophagitis: Secondary | ICD-10-CM

## 2014-10-09 LAB — LIPID PANEL
CHOL/HDL RATIO: 3
Cholesterol: 166 mg/dL (ref 0–200)
HDL: 52 mg/dL (ref >39.00)
LDL CALC: 88 mg/dL (ref 0–99)
NonHDL: 114
Triglycerides: 128 mg/dL (ref 0.0–149.0)
VLDL: 25.6 mg/dL (ref 0.0–40.0)

## 2014-10-09 MED ORDER — CYANOCOBALAMIN 1000 MCG/ML IJ SOLN
1000.0000 ug | Freq: Once | INTRAMUSCULAR | Status: AC
  Administered 2014-10-09: 1000 ug via INTRAMUSCULAR

## 2014-10-09 NOTE — Assessment & Plan Note (Signed)
Disc goals for lipids and reasons to control them Panel today with better diet  Rev low sat fat diet in detail

## 2014-10-09 NOTE — Progress Notes (Signed)
Pre visit review using our clinic review tool, if applicable. No additional management support is needed unless otherwise documented below in the visit note. 

## 2014-10-09 NOTE — Assessment & Plan Note (Signed)
No chf symptoms  Only happens with dependent legs  inst to wear supp hose with prolonged sitting/standing (such as when playing cards)  Also sodium avoidance and wt loss  Will update if this does not help  No edema today

## 2014-10-09 NOTE — Progress Notes (Signed)
Subjective:    Patient ID: Kelli Velasquez, female    DOB: June 16, 1941, 74 y.o.   MRN: [id number]  HPI Here with a list of problems   GERD On prilosec in am and zantac in pm  She is watching diet  If she behaves then does not have breakthrough  Is worried about long term affects  She got worse after drinking OJ on vacation   Cholesterol check - due for  Has been watching diet very closely - no fried food / and staying away from fatty foods   Since last visit - gained and lost 20 lb   Needs a derm visit- to remove tags on face and look at some lesions on arms   OAB Was on myrbetric -cannot afford  Sees urology - had disc botox for bladder  Has not had bladder tack up  Has been on other medications -- and they did not work as well   Has not been to eye doctor in quite a while -wants to scheduled  Has dry eye  Will make her own appt   Swelling in her ankles and feet - comes and goes  Worse if sits with feet down  No sob or chest pain  Nothing new with cardiogy visit  Has support stockings  Due for B12 shot  Helps her energy level   Patient Active Problem List   Diagnosis Date Noted  . GERD (gastroesophageal reflux disease) 10/10/2014  . B12 deficiency 10/09/2014  . Skin lesions 10/09/2014  . Cataracts, bilateral 10/09/2014  . Dry eyes 10/09/2014  . Fatigue 05/08/2014  . Pedal edema 05/08/2014  . Shoulder tendonitis 01/24/2014  . Osteopenia 11/28/2013  . Estrogen deficiency 11/02/2013  . Dyspnea 06/27/2013  . Hernia, incisional 04/03/2013  . 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 Cough 12/18/2012  . Chronic respiratory failure with hypoxia 10/26/2012  . Special screening for malignant neoplasms, colon 06/16/2011  . DYSPHONIA 10/10/2010  . HYPERGLYCEMIA 05/27/2010  . CAD, NATIVE VESSEL 08/19/2009  . 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 OBESITY 09/01/2007  . MYOPIA 03/03/2007  . Hyperlipidemia 10/15/2006  .  HYPERTENSION 10/15/2006  . ALLERGIC RHINITIS 10/15/2006  . Cough variant asthma 10/15/2006  . OVERACTIVE BLADDER 10/15/2006  . OSTEOARTHRITIS 10/15/2006  . Urinary incontinence 10/15/2006   Past Medical History  Diagnosis Date  . Myocardial infarction     subendocardial, initial episode  . Hyperlipidemia   . Hypertension   . Obesity   . Neoplasm of skin     neoplasm of uncertain behavior of skin  . Vertigo   . Myopia   . Overactive bladder   . Urinary incontinence   . Osteoarthritis   . Asthma   . Allergy     allergic rhinitis  . Rash     and other non specific skin eruptions  . Gallstones   . Coronary artery disease     cath January 2011 with DEs LAD and RCA  . GERD (gastroesophageal reflux disease)   . Nocturnal oxygen desaturation     o2 at night  . Wears glasses   . Full dentures    Past Surgical History  Procedure Laterality Date  . Total knee arthroplasty  2010    left , then vocal cord infection post op  . Vocal cord polypectomy    . Cholecystectomy  89  . Joint replacement  2007    right total knee replacement  . Dilation and curettage of uterus  1984  . Colonoscopy    . Coronary angioplasty with stent placement  07/2009    stent  . Incisional hernia repair N/A 05/16/2013    Procedure: HERNIA REPAIR INFRAUMILICAL INCISIONAL;  Surgeon: Joyice Faster. Cornett, MD;  Location: [place];  Service: General;  Laterality: N/A;  umbilical  . Insertion of mesh N/A 05/16/2013    Procedure: INSERTION OF MESH;  Surgeon: Joyice Faster. Cornett, MD;  Location: [place];  Service: General;  Laterality: N/A;  umbilical  . Video bronchoscopy Bilateral 07/05/2013    Procedure: VIDEO BRONCHOSCOPY WITH FLUORO;  Surgeon: Tanda Rockers, MD;  Location: [facility];  Service: Cardiopulmonary;  Laterality: Bilateral;   History  Substance Use Topics  . Smoking status: Former Smoker -- 1.00 packs/day for 25 years    Types: Cigarettes    Quit date:  07/27/1982  . Smokeless tobacco: Never Used  . Alcohol Use: No   Family History  Problem Relation Age of Onset  . Stroke Mother   . Stroke Father   . Colon cancer Neg Hx   . Breast cancer Maternal Grandmother   . Diabetes Mother 31   Allergies  Allergen Reactions  . Shellfish Allergy Anaphylaxis  . Aspirin     REACTION: nausea and vomiting High doses  . Atorvastatin     REACTION: leg pain  . Crestor [Rosuvastatin Calcium] Other (See Comments)    Muscle pain - allergy/intolerance  . Fish Allergy   . Simvastatin     REACTION: muscle pain  . Trandolapril     REACTION: leg pain   Current Outpatient Prescriptions on File Prior to Visit  Medication Sig Dispense Refill  . amLODipine (NORVASC) 10 MG tablet TAKE 1 TABLET BY MOUTH DAILY. 30 tablet 5  . Ascorbic Acid (VITAMIN C) 1000 MG tablet Take 1,000 mg by mouth daily.      Marland Kitchen aspirin 81 MG tablet Take 81 mg by mouth daily.    . beclomethasone (QVAR) 80 MCG/ACT inhaler One puff twice daily    . calcium-vitamin D (OSCAL WITH D 500-200) 500-200 MG-UNIT per tablet Take 1 tablet by mouth daily.      . cromolyn (NASALCROM) 5.2 MG/ACT nasal spray Place 2 sprays into the nose daily.     Marland Kitchen EPINEPHrine (EPI-PEN) 0.3 mg/0.3 mL DEVI Inject 0.3 mLs (0.3 mg total) into the muscle once. As needed for allergic reaction 1 Device 3  . furosemide (LASIX) 20 MG tablet TAKE 1 TABLET BY MOUTH EVERY DAY AS NEEDED 30 tablet 5  . Multiple Vitamins-Minerals (MULTIVITAL-M) TABS Take 1 tablet by mouth daily.    . nitroGLYCERIN (NITROSTAT) 0.4 MG SL tablet Place 0.4 mg under the tongue every 5 (five) minutes as needed for chest pain.     Marland Kitchen omeprazole (PRILOSEC OTC) 20 MG tablet Take 1 tablet (20 mg total) by mouth daily.    . ranitidine (ZANTAC) 150 MG tablet One at bedtime    . traMADol (ULTRAM) 50 MG tablet Take 1 tablet (50 mg total) by mouth every 6 (six) hours as needed. 50 tablet 2  . UNABLE TO FIND Med Name: b-12 injections per Dr. Glori Bickers    . ZETIA 10  MG tablet TAKE 1 TABLET (10 MG TOTAL) BY MOUTH DAILY. 30 tablet 5  . albuterol (PROVENTIL HFA;VENTOLIN HFA) 108 (90 BASE) MCG/ACT inhaler Inhale 2 puffs into the lungs every 4 (four) hours as needed for wheezing or shortness of breath. 1 Inhaler 5   No current facility-administered  medications on file prior to visit.     Review of Systems Review of Systems  Constitutional: Negative for fever, appetite change,  and unexpected weight change.  ENT neg for ST or hoarseness if she takes her gerd med  Eyes: Negative for pain and pos for vision change from cataracts as well as dry eye  Respiratory: Negative for cough and shortness of breath.   Cardiovascular: Negative for cp or palpitations   pos for pedal edema if sitting, neg for PND/ orthopnea  Gastrointestinal: Negative for nausea, diarrhea and constipation. neg for indigestion if she takes her GERD med  Genitourinary: pos for urgency/incontinence / OAB Skin: Negative for pallor or rash  pos for skin lesions on face and arms  Neurological: Negative for weakness, light-headedness, numbness and headaches.  Hematological: Negative for adenopathy. Does not bruise/bleed easily.  Psychiatric/Behavioral: Negative for dysphoric mood. The patient is not nervous/anxious.         Objective:   Physical Exam  Constitutional: She appears well-developed and well-nourished. No distress.  obese and well appearing   HENT:  Head: Normocephalic and atraumatic.  Right Ear: External ear normal.  Left Ear: External ear normal.  Nose: Nose normal.  Mouth/Throat: Oropharynx is clear and moist.  Eyes: Conjunctivae and EOM are normal. Pupils are equal, round, and reactive to light. Right eye exhibits no discharge. Left eye exhibits no discharge. No scleral icterus.  Neck: Normal range of motion. Neck supple. No JVD present. Carotid bruit is not present. No thyromegaly present.  Cardiovascular: Normal rate, regular rhythm, normal heart sounds and intact distal  pulses.  Exam reveals no gallop.   Pulmonary/Chest: Effort normal and breath sounds normal. No respiratory distress. She has no wheezes. She has no rales.  Abdominal: Soft. Bowel sounds are normal. She exhibits no distension and no mass. There is no tenderness. There is no rebound and no guarding.  No suprapubic tenderness or fullness   No cva tenderness   Musculoskeletal: She exhibits no edema or tenderness.  No ankle edema at all today  Lymphadenopathy:    She has no cervical adenopathy.  Neurological: She is alert. She has normal reflexes. No cranial nerve deficit. She exhibits normal muscle tone. Coordination normal.  Skin: Skin is warm and dry. No rash noted. No erythema. No pallor.  Some keratoses on arms and skin tags on face   Psychiatric: She has a normal mood and affect.          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 as well         Musculoskeletal and Integument   Skin lesions    Tags on face and keratoses on arms  Ref to derm at pt's request       Relevant Orders   Ambulatory referral to Dermatology     Genitourinary   OVERACTIVE BLADDER    Cannot afford current med  Is afraid of surgical procedures  Urged to return to urologist to disc all options         Other   Cataracts, bilateral  Pt requested ref to opth for this and dry eyes      Hyperlipidemia    Disc goals for lipids and reasons to control them Panel today with better diet  Rev low sat fat diet in detail        Relevant Orders   Lipid panel (Completed)   Pedal edema - Primary    No chf symptoms  Only happens with dependent legs   inst to wear supp hose with prolonged sitting/standing (such as when playing cards)  Also sodium avoidance and wt loss  Will update if this does not help  No edema today      Urinary incontinence    Other Visit Diagnoses    Need for prophylactic vaccination against Streptococcus pneumoniae (pneumococcus)        Relevant Orders    Pneumococcal conjugate vaccine 13-valent IM (Completed)

## 2014-10-09 NOTE — Telephone Encounter (Signed)
Needs ref to opth for cataracts and dry eyes

## 2014-10-09 NOTE — Assessment & Plan Note (Signed)
Cannot afford current med  Is afraid of surgical procedures  Urged to return to urologist to disc all options

## 2014-10-09 NOTE — Assessment & Plan Note (Signed)
Tags on face and keratoses on arms  Ref to derm at pt's request

## 2014-10-09 NOTE — Assessment & Plan Note (Signed)
B12 shot today. 

## 2014-10-09 NOTE — Patient Instructions (Addendum)
B12 today prevnar vaccine today  Cholesterol check today  Call your urologist to make plan for your bladder issues  Stop at check out for dermatology referral   Continue reflux medicines and work on weight loss

## 2014-10-10 ENCOUNTER — Telehealth: Payer: Self-pay | Admitting: Internal Medicine

## 2014-10-10 DIAGNOSIS — K219 Gastro-esophageal reflux disease without esophagitis: Secondary | ICD-10-CM | POA: Insufficient documentation

## 2014-10-10 NOTE — Telephone Encounter (Signed)
ONO on RA pos  Needs to continue 2lpm with sleep  Spoke with pt and notified of results per Dr. Melvyn Novas. Pt verbalized understanding and denied any questions.

## 2014-10-10 NOTE — Assessment & Plan Note (Signed)
Pt requested ref to opth for this and dry eyes

## 2014-10-10 NOTE — Assessment & Plan Note (Signed)
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

## 2014-10-15 ENCOUNTER — Ambulatory Visit: Payer: Self-pay | Admitting: Family Medicine

## 2014-10-16 ENCOUNTER — Encounter: Payer: Self-pay | Admitting: Family Medicine

## 2014-10-29 ENCOUNTER — Other Ambulatory Visit: Payer: Self-pay | Admitting: Dermatology

## 2014-10-29 ENCOUNTER — Encounter: Payer: Self-pay | Admitting: Internal Medicine

## 2014-11-13 ENCOUNTER — Ambulatory Visit (INDEPENDENT_AMBULATORY_CARE_PROVIDER_SITE_OTHER): Payer: Commercial Managed Care - HMO

## 2014-11-13 DIAGNOSIS — E538 Deficiency of other specified B group vitamins: Secondary | ICD-10-CM

## 2014-11-13 MED ORDER — CYANOCOBALAMIN 1000 MCG/ML IJ SOLN
1000.0000 ug | Freq: Once | INTRAMUSCULAR | Status: AC
  Administered 2014-11-13: 1000 ug via INTRAMUSCULAR

## 2014-11-20 ENCOUNTER — Ambulatory Visit: Payer: Commercial Managed Care - HMO | Admitting: Family Medicine

## 2014-11-21 ENCOUNTER — Telehealth: Payer: Self-pay | Admitting: Family Medicine

## 2014-11-21 NOTE — Telephone Encounter (Signed)
Pt didn't realize she scheduled the appt she thought she was sending you a message through mychart, appt cancelled, pt said that she is doing okay and didn't need to r/s appt she thinks that her cough is related to allergies

## 2014-11-21 NOTE — Telephone Encounter (Signed)
Patient did not come in for their appointment today for cough.  Please let me know if patient needs to be contacted immediately for follow up or no follow up needed.

## 2014-11-21 NOTE — Telephone Encounter (Signed)
Please check in with her and make sure she is feeling ok  Thanks

## 2014-11-22 ENCOUNTER — Other Ambulatory Visit: Payer: Self-pay | Admitting: Dermatology

## 2014-12-09 ENCOUNTER — Other Ambulatory Visit: Payer: Self-pay | Admitting: Family Medicine

## 2014-12-10 ENCOUNTER — Other Ambulatory Visit: Payer: Self-pay | Admitting: Family Medicine

## 2014-12-21 ENCOUNTER — Ambulatory Visit (INDEPENDENT_AMBULATORY_CARE_PROVIDER_SITE_OTHER): Payer: Commercial Managed Care - HMO | Admitting: *Deleted

## 2014-12-21 DIAGNOSIS — E538 Deficiency of other specified B group vitamins: Secondary | ICD-10-CM | POA: Diagnosis not present

## 2014-12-21 MED ORDER — CYANOCOBALAMIN 1000 MCG/ML IJ SOLN
1000.0000 ug | Freq: Once | INTRAMUSCULAR | Status: AC
  Administered 2014-12-21: 1000 ug via INTRAMUSCULAR

## 2015-01-02 ENCOUNTER — Ambulatory Visit (INDEPENDENT_AMBULATORY_CARE_PROVIDER_SITE_OTHER): Payer: Commercial Managed Care - HMO | Admitting: Internal Medicine

## 2015-01-02 ENCOUNTER — Encounter: Payer: Self-pay | Admitting: Internal Medicine

## 2015-01-02 VITALS — BP 150/86 | HR 91 | Ht 65.0 in | Wt 253.0 lb

## 2015-01-02 DIAGNOSIS — I10 Essential (primary) hypertension: Secondary | ICD-10-CM

## 2015-01-02 DIAGNOSIS — J45991 Cough variant asthma: Secondary | ICD-10-CM | POA: Diagnosis not present

## 2015-01-02 DIAGNOSIS — J9611 Chronic respiratory failure with hypoxia: Secondary | ICD-10-CM | POA: Diagnosis not present

## 2015-01-02 NOTE — Progress Notes (Signed)
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 Follow-up    Pt had PET scan done this am. Her cough and SOB have improved since the last visit.   saba inhaler helps some/ min mucoid sputum, temp to 99 but no rigors, no purulent sputum or hemoptysis/ does have some wt. Loss, no sweating.  rec Dulera 100 Take 2 puffs first thing in am and then another 2 puffs about 12 hours later.  Prednisone 10 mg take  4 each am x 2 days,   2 each am x 2 days,  1 each am x 2 days and stop Only use your albuterol as a rescue medication  We will arrange 24/7 02 at 3lpm per advanced  We will call to arrange a biopsy of the easiest area once I discuss this with radiology  Late add: Discussed in detail all the  indications, usual  risks and alternatives  relative to the benefits with patient who agrees to proceed with bronchoscopy with biopsy.   FOB > done 07/05/2013 > nl airways > neg afb/fungus/path  - IR Bx 07/11/2013 > inflammatory, special stains pending but does not look like tb or fungus and responds short course pred per pt > rec prednisone x 6 days only and needs VATS bx but declines as of 07/13/2013 > rec'd final path from [place] review > c/w BOOP though can't exclude asp/infection sources     07/24/2013 f/u ov/Ruie Sendejo re: ? Nodular BOOP Chief Complaint  Patient presents with  . Followup with CXR    Pt states that her breathing is overall doing well. She states that she is able to more activity and feels stronger.   02 2lpm when ambulatory sats in mid  90's last dose of prednisone sev weeks ago really helped sob and no worse since stopped.  No h/o arthralgias, rash, pet exposure. rec If condition worsens take pred x 6 days trial    09/06/2013 f/u ov/Ginelle Bays re:  02 24/7 since Dec 2014  Chief Complaint  Patient presents with  . Follow-up    Pt states that her breathing is doing well and her cough has pretty much resolved. No new co's today. Has not had to use rescue inhaler.   Finished pred Aug 11 2013 and markedly better then a little worse since few days prior to OV   = more cough white mucus, no fever. rec Please remember to go to the lab and x-ray department downstairs for your tests - we will call you with the results when they are available. Prednisone 20 mg per day until your best then reduce to 10 mg per day x one week then one half daily as tolerated, increase back up if needed. Not really clear how much asthma present > ok to change dulera to prn      03/07/2014 f/u ov/Zacharie Portner re: nodular boop / pred 10 mg daily  Chief Complaint  Patient presents with  . Follow-up    Pt states  doing well and denies any co's today.   Not limited by breathing from desired activities  Except by the humdity   rec Taper pred off by around 04/09/14    04/18/2014 f/u ov/Charina Fons re: nodular boop x 2 weeks off pred Chief Complaint  Patient presents with  . Follow-up    Pt stopped pred x 2 wks ago- since then c/o fatigue.    no cough, no nausea/fever/ sweats. Fatigue was bad the first week off pred and better the next  rec No change rx - leave off  pred   06/26/2014 f/u ov/Jerry Haugen re: f/u boop  Off pred since 04/09/14  Chief Complaint  Patient presents with  . Follow-up    productive cough white   does not think she coughed much while on prednisone but back to baseline chronic cough once stopped it   Albuterol helps some  Cough flares with rain any time of year  Tends to occur also at hs despite two h1  At hs  and pepcid at hs but total amt of mucus is  very small  rec qvar 80 Take 2 puffs first thing in am and then another 2 puffs about 12 hours later and if improve on this ok to reduce to one twice daily  Work on inhaler technique:  Ok to use the cough medicine and albuterol as needed  Would avoid fosfamax for now with alternative being reclast one injection yearly    10/01/2014 f/u ov/Jullian Previti re: boop Off pred since 04/09/14 / asthma  on qvar 80 2 bid and very rare saba Chief Complaint  Patient presents with  . Follow-up    CXR done today. Pt states her breathing is doing well overall with minimal cough.    rec Try qvar 80 one twice daily  Please see patient coordinator before you leave today  to schedule overnight oximetry to see if ok to stop your 02 > still required it   01/02/2015 f/u ov/Carsen Machi re: s/p boop/ cough variant asthma qva 80 one twice daily  Chief Complaint  Patient presents with  . Follow-up    Pt states breathing has improved. Pt c/o occasional prod cough with white mucus.      No obvious day to day or daytime variabilty or assoc sob cp or chest tightness, subjective wheeze overt sinus or hb symptoms. No unusual exp hx or h/o childhood pna/ asthma or knowledge of premature birth.  Sleeping ok without nocturnal  or early am exacerbation  of respiratory  c/o's or need for noct saba. Also denies any obvious fluctuation of symptoms with weather or environmental changes or other aggravating or alleviating factors except as outlined above   Current Medications, Allergies, Complete Past Medical History, Past Surgical History, Family History, and Social History were reviewed in [employer] record.  ROS  The following are not active complaints unless bolded sore throat, dysphagia, dental problems, itching, sneezing,  nasal congestion or excess/ purulent secretions, ear ache,   fever, chills, sweats, unintended wt loss, pleuritic or exertional cp, hemoptysis,  orthopnea pnd or leg swelling, presyncope,  palpitations, heartburn, abdominal pain, anorexia, nausea, vomiting, diarrhea  or change in bowel or urinary habits, change in stools or urine, dysuria,hematuria,  rash, arthralgias, visual complaints, headache, numbness weakness or ataxia or problems with walking or coordination,  change in mood/affect or memory.           PMH:  -Hx of NSTEMI.07/2009 w/  cardiac cath s/p 2 stents in LAD/  RCA       >>Echo at that time showed Mild LVH, EF of 55% and grade 1 diastolic dysfuntion  -Hyperlipidemia -HTN  -OA -s/p bilat. Knee replacements - Obesity    SH:  Retired from [employer] work Former smoker -quit 1984 -smoked x 20 1 PPD  Married  No pets  No unusual hobbies  [place] on vacation frequently (1-10/2012 -traveled to [place], [place] and [place] )    [place] :  Stroke         Objective:   Physical Exam GEN: A/Ox3; pleasant , NAD, obese WF  / amb nad   01/31/2013   237  > 07/03/2013  232 > 07/24/2013  231 > 09/06/2013  233 > 04/18/2014 256  10/18/2013  233 > 11/29/2013 238 >  03/07/2014  249 > 05/17/2014  263 > 06/26/2014 261>   10/01/2014  247 > 01/02/2015  253  Wt Readings from Last 3 Encounters:  12/16/12 246 lb (111.585 kg)  11/24/12 252 lb 9.6 oz (114.579 kg)  11/10/12 245 lb 8 oz (111.358 kg)     HEENT:  [place]/AT,  EACs-clear, TMs-wnl, NOSE-clear, THROAT-clear, no lesions, no postnasal drip or exudate noted.    Edentulous   NECK:  Supple w/ fair ROM; no JVD; normal carotid impulses w/o bruits; no thyromegaly or nodules palpated; no lymphadenopathy.  RESP    slt decreased bs bilaterally, no insp crackles or exp rhonchi  CARD:  RRR, no m/r/g - No longer  any   lower ext  edema, pulses intact, no cyanosis or clubbing., neg homans sign   GI:   Soft & nt; nml bowel sounds; no organomegaly or masses detected.  Musco: Warm bil, no deformities or joint swelling noted.  Bilateral knee scars        I personally reviewed images and agree with radiology impression as follows:  CXR:   10/01/14  1.  Cardiomegaly. Normal pulmonary vascularity. 2. Mild bibasilar subsegmental atelectasis and/or scarring.           Assessment & Plan:

## 2015-01-02 NOTE — Patient Instructions (Signed)
Ok to take qvar up to 2 every 12 hours or down to zero taper no more frequently than every 2 weeks   Keep working on the weight   If you are satisfied with your treatment plan,  let your doctor know and he/she can either refill your medications or you can return here when your prescription runs out.     If in any way you are not 100% satisfied,  please tell us.  If 100% better, tell your friends!  Pulmonary follow up is as needed

## 2015-01-03 ENCOUNTER — Encounter: Payer: Self-pay | Admitting: Internal Medicine

## 2015-01-03 NOTE — Assessment & Plan Note (Addendum)
-  PFT's 01/31/2013 wnl x low fef25-75 and 12% better fev1 p B2 - 06/26/2014 p extensive coaching HFA effectiveness =    75% . Start qvar 80 2bid  - trial of qvar 80 1 bid 10/01/14 > improved 01/02/15   I had an extended final summary discussion with the patient reviewing all relevant studies completed to date and  lasting 15 to 20 minutes of a 25 minute visit on the following issues:    All goals of chronic asthma control met including optimal function and elimination of symptoms with minimal need for rescue therapy and prob ok to try taper qvar off to see if flares and if so start back over at 2 bid and work back down, emphasizing takes about a week to take effect or wear off   Contingencies discussed in full including contacting this office immediately if not controlling the symptoms using the rule of two's.     Each maintenance medication was reviewed in detail including most importantly the difference between maintenance and as needed and under what circumstances the prns are to be used.  Please see instructions for details which were reviewed in writing and the patient given a copy.

## 2015-01-03 NOTE — Assessment & Plan Note (Signed)
Lab Results  Component Value Date   TSH 0.62 05/08/2014     Cal balance issue reviewed > f/u primary care

## 2015-01-03 NOTE — Assessment & Plan Note (Signed)
Not Adequate control on present rx, reviewed > no change in rx needed  For now/ wt loss/ avoid salt/ f/u primary care advised

## 2015-01-03 NOTE — Assessment & Plan Note (Signed)
11/24/2012 Office walk showed desats 86% on RA  With  spirometry FEV1 nml-73% , ratio 80   11/25/2012 CTA Chest >>neg for PE/ ILD  12/07/12 ONO > Pos 5: 40min  begin O2 2 l/m At bedtime  12/13/2012  >  Repeat 02/01/13 on 2lpm 20min> no change rx  - 01/31/2013  Walked RA x 3 laps @ 185 ft each stopped due to  End of study, no desats  - 07/03/2013 sats 75% RA >  On 3lpm sats 93%  > rx with 24 h 02 at 3lpm   - improved by self monitoring as of 07/24/13 ov so changed rx to 2lpm/24/7 humidified due to nasal dryness - 09/06/2013  Walked 2lpm x  2 laps @ 185 ft each stopped due to legs tired, no desats    - 04/18/2014  Walked RA  @ mod pace x 2 laps @ 185 ft each stopped due to  Hip pain, slow pace, sats 89% at end  - 05/16/2014  [facility] RA  2 laps @ 185 ft each stopped due to sob/fatigue but no desats off pred x 04/10/14  - ONO RA   10/05/2014 >  02 sat < 89% 213 min so rec resume 2lpm and work on wt loss   As of 01/02/2015 02 2lpm at hs only    Likely much more related to obesity than lung dz/ advised

## 2015-01-13 ENCOUNTER — Other Ambulatory Visit: Payer: Self-pay | Admitting: Family Medicine

## 2015-01-22 ENCOUNTER — Ambulatory Visit (INDEPENDENT_AMBULATORY_CARE_PROVIDER_SITE_OTHER): Payer: Commercial Managed Care - HMO

## 2015-01-22 DIAGNOSIS — E538 Deficiency of other specified B group vitamins: Secondary | ICD-10-CM | POA: Diagnosis not present

## 2015-01-22 MED ORDER — CYANOCOBALAMIN 1000 MCG/ML IJ SOLN
1000.0000 ug | Freq: Once | INTRAMUSCULAR | Status: AC
  Administered 2015-01-22: 1000 ug via INTRAMUSCULAR

## 2015-02-07 ENCOUNTER — Other Ambulatory Visit: Payer: Self-pay | Admitting: Family Medicine

## 2015-02-08 ENCOUNTER — Other Ambulatory Visit: Payer: Self-pay | Admitting: Family Medicine

## 2015-02-21 ENCOUNTER — Ambulatory Visit (INDEPENDENT_AMBULATORY_CARE_PROVIDER_SITE_OTHER): Payer: Commercial Managed Care - HMO | Admitting: Cardiovascular Disease

## 2015-02-21 ENCOUNTER — Encounter: Payer: Self-pay | Admitting: Cardiovascular Disease

## 2015-02-21 VITALS — BP 136/74 | HR 88 | Ht 65.5 in | Wt 253.2 lb

## 2015-02-21 DIAGNOSIS — E785 Hyperlipidemia, unspecified: Secondary | ICD-10-CM

## 2015-02-21 DIAGNOSIS — I2510 Atherosclerotic heart disease of native coronary artery without angina pectoris: Secondary | ICD-10-CM | POA: Diagnosis not present

## 2015-02-21 DIAGNOSIS — I10 Essential (primary) hypertension: Secondary | ICD-10-CM

## 2015-02-21 MED ORDER — NITROGLYCERIN 0.4 MG SL SUBL: 0.4000 mg | SUBLINGUAL_TABLET | SUBLINGUAL | Status: DC | PRN

## 2015-02-21 NOTE — Patient Instructions (Signed)
Medication Instructions:  Your physician recommends that you continue on your current medications as directed. Please refer to the Current Medication list given to you today.   Labwork: none  Testing/Procedures: none  Follow-Up: Your physician wants you to follow-up in:  12 months.  You will receive a reminder letter in the mail two months in advance. If you don't receive a letter, please call our office to schedule the follow-up appointment.        

## 2015-02-21 NOTE — Progress Notes (Signed)
Chief Complaint  Patient presents with  . Cough     History of Present Illness: 74 yo female with h/o CAD, HTN, OA and obesity here today for cardiac follow up. She was admitted to [facility] from 07/27/09 to 07/31/09 with a NSTEMI. She had a cardiac cath on 07/29/09 showing severe two vessel disease and we placed Promus drug eluting stents in the LAD and the RCA. Beta blocker stopped due to wheezing with bronchitis in 2013. Her lung issues are followed by Dr. Melvyn Novas. Stress test September 2015 with no ischemia.   She is here today for follow up. She has had no chest pain or SOB. She has been doing well. She is exercising daily with water aerobics. Occasional leg edema.   Primary Care Physician: [place].   Last Lipid Profile:Lipid Panel     Component Value Date/Time   CHOL 166 10/09/2014 0904   TRIG 128.0 10/09/2014 0904   HDL 52.00 10/09/2014 0904   CHOLHDL 3 10/09/2014 0904   VLDL 25.6 10/09/2014 0904   LDLCALC 88 10/09/2014 0904     Past Medical History  Diagnosis Date  . Myocardial infarction     subendocardial, initial episode  . Hyperlipidemia   . Hypertension   . Obesity   . Neoplasm of skin     neoplasm of uncertain behavior of skin  . Vertigo   . Myopia   . Overactive bladder   . Urinary incontinence   . Osteoarthritis   . Asthma   . Allergy     allergic rhinitis  . Rash     and other non specific skin eruptions  . Gallstones   . Coronary artery disease     cath January 2011 with DEs LAD and RCA  . GERD (gastroesophageal reflux disease)   . Nocturnal oxygen desaturation     o2 at night  . Wears glasses   . Full dentures     Past Surgical History  Procedure Laterality Date  . Total knee arthroplasty  2010    left , then vocal cord infection post op  . Vocal cord polypectomy    . Cholecystectomy  92  . Joint replacement  2007    right total knee replacement  . Dilation and curettage of uterus  1984  . Colonoscopy    . Coronary  angioplasty with stent placement  07/2009    stent  . Incisional hernia repair N/A 05/16/2013    Procedure: HERNIA REPAIR INFRAUMILICAL INCISIONAL;  Surgeon: Joyice Faster. Cornett, MD;  Location: [place];  Service: General;  Laterality: N/A;  umbilical  . Insertion of mesh N/A 05/16/2013    Procedure: INSERTION OF MESH;  Surgeon: Joyice Faster. Cornett, MD;  Location: [place];  Service: General;  Laterality: N/A;  umbilical  . Video bronchoscopy Bilateral 07/05/2013    Procedure: VIDEO BRONCHOSCOPY WITH FLUORO;  Surgeon: Tanda Rockers, MD;  Location: [facility];  Service: Cardiopulmonary;  Laterality: Bilateral;    Current Outpatient Prescriptions  Medication Sig Dispense Refill  . amLODipine (NORVASC) 10 MG tablet TAKE 1 TABLET BY MOUTH DAILY. 30 tablet 5  . Ascorbic Acid (VITAMIN C) 1000 MG tablet Take 1,000 mg by mouth daily.      Marland Kitchen aspirin 81 MG tablet Take 81 mg by mouth daily.    . beclomethasone (QVAR) 80 MCG/ACT inhaler One puff twice daily (Patient taking differently: Inhale 1 puff into the lungs daily. One puff twice daily)    .  calcium-vitamin D (OSCAL WITH D 500-200) 500-200 MG-UNIT per tablet Take 1 tablet by mouth daily.      . cromolyn (NASALCROM) 5.2 MG/ACT nasal spray Place 2 sprays into the nose daily.     Marland Kitchen EPIPEN 2-PAK 0.3 MG/0.3ML SOAJ injection INJECT 0.3 MLS (0.3 MG TOTAL) INTO THE MUSCLE ONCE AS NEEDED FOR ALLERGIC REACTION 2 Device 0  . furosemide (LASIX) 20 MG tablet TAKE 1 TABLET BY MOUTH EVERY DAY AS NEEDED 30 tablet 2  . Multiple Vitamins-Minerals (MULTIVITAL-M) TABS Take 1 tablet by mouth daily.    . nitroGLYCERIN (NITROSTAT) 0.4 MG SL tablet Place 1 tablet (0.4 mg total) under the tongue every 5 (five) minutes as needed for chest pain. 25 tablet 6  . omeprazole (PRILOSEC OTC) 20 MG tablet Take 1 tablet (20 mg total) by mouth daily.    Marland Kitchen PROAIR HFA 108 (90 BASE) MCG/ACT inhaler INHALE 2 PUFFS BY MOUTH EVERY 4 HOURS AS NEEDED FOR  WHEEZING OR FOR SHORTNESS OF BREATH 8 Inhaler 5  . ranitidine (ZANTAC) 150 MG tablet One at bedtime    . UNABLE TO FIND Med Name: b-12 injections per Dr. Glori Bickers    . ZETIA 10 MG tablet TAKE 1 TABLET (10 MG TOTAL) BY MOUTH DAILY. 30 tablet 5   No current facility-administered medications for this visit.    Allergies  Allergen Reactions  . Shellfish Allergy Anaphylaxis  . Aspirin     REACTION: nausea and vomiting High doses  . Atorvastatin     REACTION: leg pain  . Crestor [Rosuvastatin Calcium] Other (See Comments)    Muscle pain - allergy/intolerance  . Fish Allergy   . Simvastatin     REACTION: muscle pain  . Trandolapril     REACTION: leg pain    History   Social History  . Marital Status: Married    Spouse Name: N/A  . Number of Children: N/A  . Years of Education: N/A   Occupational History  . Not on file.   Social History Main Topics  . Smoking status: Former Smoker -- 1.00 packs/day for 25 years    Types: Cigarettes    Quit date: 07/27/1982  . Smokeless tobacco: Never Used  . Alcohol Use: No  . Drug Use: No  . Sexual Activity: Not on file   Other Topics Concern  . Not on file   Social History Narrative    Family History  Problem Relation Age of Onset  . Stroke Mother   . Stroke Father   . Colon cancer Neg Hx   . Breast cancer Maternal Grandmother   . Diabetes Mother 45    Review of Systems:  As stated in the HPI and otherwise negative.   BP 136/74 mmHg  Pulse 88  Ht 5' 5.5" (1.664 m)  Wt 253 lb 4 oz (114.873 kg)  BMI 41.49 kg/m2  LMP 07/27/1980  Physical Examination: General: Well developed, well nourished, NAD HEENT: OP clear, mucus membranes moist SKIN: warm, dry. No rashes. Neuro: No focal deficits Musculoskeletal: Muscle strength 5/5 all ext Psychiatric: Mood and affect normal Neck: No JVD, no carotid bruits, no thyromegaly, no lymphadenopathy. Lungs:Clear bilaterally, no wheezes, rhonci, crackles Cardiovascular: Regular rate and  rhythm. No murmurs, gallops or rubs. Abdomen:Soft. Bowel sounds present. Non-tender.  Extremities: No lower extremity edema. Pulses are 2 + in the bilateral DP/PT.  EKG:  EKG is ordered today. The ekg ordered today demonstrates NSR, rate 88 bpm  Recent Labs: 05/08/2014: ALT 22; BUN 15; Creatinine,  Ser 0.8; Hemoglobin 13.7; Platelets 285.0; Potassium 4.0; Pro B Natriuretic peptide (BNP) 50.0; Sodium 140; TSH 0.62   Lipid Panel    Component Value Date/Time   CHOL 166 10/09/2014 0904   TRIG 128.0 10/09/2014 0904   HDL 52.00 10/09/2014 0904   CHOLHDL 3 10/09/2014 0904   VLDL 25.6 10/09/2014 0904   LDLCALC 88 10/09/2014 0904   LDLDIRECT 124.8 09/01/2007 0909     Wt Readings from Last 3 Encounters:  02/21/15 253 lb 4 oz (114.873 kg)  01/02/15 253 lb (114.76 kg)  10/09/14 248 lb (112.492 kg)     Other studies Reviewed: Additional studies/ records that were reviewed today include: . Review of the above records demonstrates:    Assessment and Plan:   1. CAD: Stable. Stress myoview September 2015 with no ischemia. Will continue ASA and and Zetia. She is intolerant to statins. Beta blocker stopped by pulmonary secondary to possible bronchospasm.   2.  HYPERTENSION:  BP well controlled at home. No changes.     3. HLD: She is on Zetia. She does not tolerate statins. Lipids well controlled.    Current medicines are reviewed at length with the patient today.  The patient does not have concerns regarding medicines.  The following changes have been made:  no change  Labs/ tests ordered today include:   Orders Placed This Encounter  Procedures  . EKG 12-Lead    Disposition:   FU with me in 12  months  Signed, Lauree Chandler, MD 02/21/2015 2:21 PM    [facility] [place], [place], [place]  [address] Phone: [phone]; Fax: [phone]

## 2015-02-22 ENCOUNTER — Ambulatory Visit (INDEPENDENT_AMBULATORY_CARE_PROVIDER_SITE_OTHER): Payer: Commercial Managed Care - HMO | Admitting: *Deleted

## 2015-02-22 DIAGNOSIS — E538 Deficiency of other specified B group vitamins: Secondary | ICD-10-CM | POA: Diagnosis not present

## 2015-02-22 MED ORDER — CYANOCOBALAMIN 1000 MCG/ML IJ SOLN
1000.0000 ug | Freq: Once | INTRAMUSCULAR | Status: AC
  Administered 2015-02-22: 1000 ug via INTRAMUSCULAR

## 2015-03-15 ENCOUNTER — Telehealth: Payer: Self-pay

## 2015-03-15 NOTE — Telephone Encounter (Signed)
The only other option for her would be a med called welchol - she would have to call a pharmacy [id number] before we px it   You take 6 pills daily (either 3 twice daily or 6 at once)  She may or may not be up to that   I wish zetia was more affordable !

## 2015-03-15 NOTE — Telephone Encounter (Signed)
Pt left v/m; Zetia is too expensive; cost to pt $ 128/mth. Pt request different med that is generic and not a statin. [facility]. Pt request cb.

## 2015-03-15 NOTE — Telephone Encounter (Signed)
Pt will check with ins/pharmacy to see price I did advise pt she would have to take 6 tabs a day

## 2015-03-29 ENCOUNTER — Encounter: Payer: Self-pay | Admitting: Family Medicine

## 2015-03-29 ENCOUNTER — Ambulatory Visit (INDEPENDENT_AMBULATORY_CARE_PROVIDER_SITE_OTHER): Payer: Medicare HMO | Admitting: Family Medicine

## 2015-03-29 VITALS — BP 140/70 | HR 88 | Temp 98.1°F | Wt 249.0 lb

## 2015-03-29 DIAGNOSIS — J45991 Cough variant asthma: Secondary | ICD-10-CM | POA: Diagnosis not present

## 2015-03-29 DIAGNOSIS — K219 Gastro-esophageal reflux disease without esophagitis: Secondary | ICD-10-CM | POA: Diagnosis not present

## 2015-03-29 DIAGNOSIS — E538 Deficiency of other specified B group vitamins: Secondary | ICD-10-CM

## 2015-03-29 DIAGNOSIS — Z23 Encounter for immunization: Secondary | ICD-10-CM | POA: Diagnosis not present

## 2015-03-29 LAB — VITAMIN B12: VITAMIN B 12: 983 pg/mL — AB (ref 211–911)

## 2015-03-29 MED ORDER — ESOMEPRAZOLE MAGNESIUM 40 MG PO CPDR: 40.0000 mg | DELAYED_RELEASE_CAPSULE | Freq: Every day | ORAL | Status: DC

## 2015-03-29 MED ORDER — CYANOCOBALAMIN 1000 MCG/ML IJ SOLN
1000.0000 ug | Freq: Once | INTRAMUSCULAR | Status: AC
  Administered 2015-03-29: 1000 ug via INTRAMUSCULAR

## 2015-03-29 NOTE — Patient Instructions (Addendum)
Please increase your Qvar to 2 puffs twice daily  Also stop omeprazole and change to nexium 40 mg once daily in am  Continue zantac at night  Watch diet and keep loosing weight Call and make a follow up appointment with Dr Melvyn Novas when you can  B12 shot today  Lab today for B12 level    Follow up with me in about 2 months

## 2015-03-29 NOTE — Progress Notes (Signed)
Subjective:    Patient ID: Kelli Velasquez, female    DOB: May 09, 1941, 74 y.o.   MRN: [id number]  HPI Here for asthma and stomach problems   Last few months worse asthma  She has seen Dr Melvyn Novas in the past  On Qvar- reduced down to 1 puff bid (was on 2 puffs) a while ago  2 months of worse symptoms   Using albuterol   Has phlegm in throat all the time Clears her throat  Worse after she eats   Not feeling heartburn   On omeprazole and zantac   B12 def On oral and shots Wants level today  PPI dec abs   Patient Active Problem List   Diagnosis Date Noted  . GERD (gastroesophageal reflux disease) 10/10/2014  . B12 deficiency 10/09/2014  . Skin lesions 10/09/2014  . Cataracts, bilateral 10/09/2014  . Dry eyes 10/09/2014  . Fatigue 05/08/2014  . Pedal edema 05/08/2014  . Shoulder tendonitis 01/24/2014  . Osteopenia 11/28/2013  . Estrogen deficiency 11/02/2013  . Dyspnea 06/27/2013  . Hernia, incisional 04/03/2013  . 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 Cough 12/18/2012  . Chronic respiratory failure with hypoxia 10/26/2012  . Special screening for malignant neoplasms, colon 06/16/2011  . DYSPHONIA 10/10/2010  . HYPERGLYCEMIA 05/27/2010  . CAD, NATIVE VESSEL 08/19/2009  . 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 Severe obesity (BMI >= 40) 09/01/2007  . MYOPIA 03/03/2007  . Hyperlipidemia 10/15/2006  . Essential hypertension 10/15/2006  . ALLERGIC RHINITIS 10/15/2006  . Cough variant asthma 10/15/2006  . OVERACTIVE BLADDER 10/15/2006  . OSTEOARTHRITIS 10/15/2006  . Urinary incontinence 10/15/2006   Past Medical History  Diagnosis Date  . Myocardial infarction     subendocardial, initial episode  . Hyperlipidemia   . Hypertension   . Obesity   . Neoplasm of skin     neoplasm of uncertain behavior of skin  . Vertigo   . Myopia   . Overactive bladder   . Urinary incontinence   . Osteoarthritis   . Asthma   . Allergy       allergic rhinitis  . Rash     and other non specific skin eruptions  . Gallstones   . Coronary artery disease     cath January 2011 with DEs LAD and RCA  . GERD (gastroesophageal reflux disease)   . Nocturnal oxygen desaturation     o2 at night  . Wears glasses   . Full dentures    Past Surgical History  Procedure Laterality Date  . Total knee arthroplasty  2010    left , then vocal cord infection post op  . Vocal cord polypectomy    . Cholecystectomy  92  . Joint replacement  2007    right total knee replacement  . Dilation and curettage of uterus  1984  . Colonoscopy    . Coronary angioplasty with stent placement  07/2009    stent  . Incisional hernia repair N/A 05/16/2013    Procedure: HERNIA REPAIR INFRAUMILICAL INCISIONAL;  Surgeon: Joyice Faster. Cornett, MD;  Location: [place];  Service: General;  Laterality: N/A;  umbilical  . Insertion of mesh N/A 05/16/2013    Procedure: INSERTION OF MESH;  Surgeon: Joyice Faster. Cornett, MD;  Location: [place];  Service: General;  Laterality: N/A;  umbilical  . Video bronchoscopy Bilateral 07/05/2013    Procedure: VIDEO BRONCHOSCOPY WITH FLUORO;  Surgeon: Tanda Rockers, MD;  Location: [facility];  Service: Cardiopulmonary;  Laterality: Bilateral;   Social History  Substance Use Topics  . Smoking status: Former Smoker -- 1.00 packs/day for 25 years    Types: Cigarettes    Quit date: 07/27/1982  . Smokeless tobacco: Never Used  . Alcohol Use: No   Family History  Problem Relation Age of Onset  . Stroke Mother   . Stroke Father   . Colon cancer Neg Hx   . Breast cancer Maternal Grandmother   . Diabetes Mother 3   Allergies  Allergen Reactions  . Shellfish Allergy Anaphylaxis  . Aspirin     REACTION: nausea and vomiting High doses  . Atorvastatin     REACTION: leg pain  . Crestor [Rosuvastatin Calcium] Other (See Comments)    Muscle pain - allergy/intolerance  . Fish Allergy   .  Simvastatin     REACTION: muscle pain  . Trandolapril     REACTION: leg pain   Current Outpatient Prescriptions on File Prior to Visit  Medication Sig Dispense Refill  . amLODipine (NORVASC) 10 MG tablet TAKE 1 TABLET BY MOUTH DAILY. 30 tablet 5  . Ascorbic Acid (VITAMIN C) 1000 MG tablet Take 1,000 mg by mouth daily.      Marland Kitchen aspirin 81 MG tablet Take 81 mg by mouth daily.    . beclomethasone (QVAR) 80 MCG/ACT inhaler One puff twice daily (Patient taking differently: Inhale 1 puff into the lungs daily. One puff twice daily)    . calcium-vitamin D (OSCAL WITH D 500-200) 500-200 MG-UNIT per tablet Take 1 tablet by mouth daily.      . cromolyn (NASALCROM) 5.2 MG/ACT nasal spray Place 2 sprays into the nose daily.     Marland Kitchen EPIPEN 2-PAK 0.3 MG/0.3ML SOAJ injection INJECT 0.3 MLS (0.3 MG TOTAL) INTO THE MUSCLE ONCE AS NEEDED FOR ALLERGIC REACTION 2 Device 0  . furosemide (LASIX) 20 MG tablet TAKE 1 TABLET BY MOUTH EVERY DAY AS NEEDED 30 tablet 2  . Multiple Vitamins-Minerals (MULTIVITAL-M) TABS Take 1 tablet by mouth daily.    . nitroGLYCERIN (NITROSTAT) 0.4 MG SL tablet Place 1 tablet (0.4 mg total) under the tongue every 5 (five) minutes as needed for chest pain. 25 tablet 6  . omeprazole (PRILOSEC OTC) 20 MG tablet Take 1 tablet (20 mg total) by mouth daily.    Marland Kitchen PROAIR HFA 108 (90 BASE) MCG/ACT inhaler INHALE 2 PUFFS BY MOUTH EVERY 4 HOURS AS NEEDED FOR WHEEZING OR FOR SHORTNESS OF BREATH 8 Inhaler 5  . ranitidine (ZANTAC) 150 MG tablet One at bedtime    . UNABLE TO FIND Med Name: b-12 injections per Dr. Glori Bickers    . ZETIA 10 MG tablet TAKE 1 TABLET (10 MG TOTAL) BY MOUTH DAILY. 30 tablet 5   No current facility-administered medications on file prior to visit.       Review of Systems    Review of Systems  Constitutional: Negative for fever, appetite change, and unexpected weight change.  Eyes: Negative for pain and visual disturbance.  ENT pos for throat clearing and globus sensation    Respiratory: pos for inc wheezing/sob and chest tightness  Cardiovascular: Negative for cp or palpitations    Gastrointestinal: Negative for nausea, diarrhea and constipation. pos for worse gerd symptoms  Genitourinary: Negative for urgency and frequency.  Skin: Negative for pallor or rash   Neurological: Negative for weakness, light-headedness, numbness and headaches.  Hematological: Negative for adenopathy. Does not bruise/bleed easily.  Psychiatric/Behavioral: Negative for dysphoric mood. The patient  is not nervous/anxious.      Objective:   Physical Exam  Constitutional: She appears well-developed and well-nourished. No distress.  obese and well appearing   HENT:  Head: Normocephalic and atraumatic.  Nose: Nose normal.  Mouth/Throat: Oropharynx is clear and moist.  Eyes: Conjunctivae and EOM are normal. Pupils are equal, round, and reactive to light. No scleral icterus.  Neck: Normal range of motion. Neck supple.  Cardiovascular: Normal rate, regular rhythm and normal heart sounds.   Pulmonary/Chest: Effort normal. No respiratory distress. She has wheezes. She has no rales.  Wheeze noted on forced expiration with mildly prolonged exp phase   Abdominal: Soft. Bowel sounds are normal. She exhibits no distension and no mass. There is no tenderness. There is no rebound and no guarding.  Musculoskeletal: She exhibits no edema.  Lymphadenopathy:    She has no cervical adenopathy.  Neurological: She is alert.  Skin: Skin is warm and dry. No erythema. No pallor.  Psychiatric: She has a normal mood and affect.          Assessment & Plan:   Problem List Items Addressed This Visit      Respiratory   Cough variant asthma    Worse lately possibly due to increased GERD symptoms Will tx that more aggressively  Also inc Qvar to 2 puffs bid temporarily  Enc f/u with pulmonary          Digestive   B12 deficiency    Level today and then B12 shot  ? If helping energy level        Relevant Medications   cyanocobalamin ((VITAMIN B-12)) injection 1,000 mcg (Completed)   Other Relevant Orders   Vitamin B12 (Completed)   GERD (gastroesophageal reflux disease) - Primary    Worse lately- with cough/ throat clearing and wheezing (worsening her asthma symptoms) Will change am ppi to nexium 40 to see if this controls symptoms better  Continue pm ranitidine as well  Also rev diet and what to avoid  Also enc wt loss   Update if not starting to improve in a week or if worsening        Relevant Medications   esomeprazole (NEXIUM) 40 MG capsule    Other Visit Diagnoses    Need for immunization against influenza        Relevant Orders    Flu Vaccine QUAD 36+ mos IM (Completed)

## 2015-03-31 NOTE — Assessment & Plan Note (Signed)
Worse lately possibly due to increased GERD symptoms Will tx that more aggressively  Also inc Qvar to 2 puffs bid temporarily  Enc f/u with pulmonary

## 2015-03-31 NOTE — Assessment & Plan Note (Signed)
Worse lately- with cough/ throat clearing and wheezing (worsening her asthma symptoms) Will change am ppi to nexium 40 to see if this controls symptoms better  Continue pm ranitidine as well  Also rev diet and what to avoid  Also enc wt loss   Update if not starting to improve in a week or if worsening

## 2015-03-31 NOTE — Assessment & Plan Note (Signed)
Level today and then B12 shot  ? If helping energy level

## 2015-04-10 ENCOUNTER — Encounter: Payer: Self-pay | Admitting: Family Medicine

## 2015-04-10 ENCOUNTER — Ambulatory Visit (INDEPENDENT_AMBULATORY_CARE_PROVIDER_SITE_OTHER): Payer: Medicare HMO | Admitting: Family Medicine

## 2015-04-10 VITALS — BP 126/74 | HR 90 | Temp 98.1°F | Ht 65.5 in | Wt 244.5 lb

## 2015-04-10 DIAGNOSIS — R1900 Intra-abdominal and pelvic swelling, mass and lump, unspecified site: Secondary | ICD-10-CM | POA: Insufficient documentation

## 2015-04-10 LAB — COMPREHENSIVE METABOLIC PANEL
ALT: 22 U/L (ref 0–35)
AST: 19 U/L (ref 0–37)
Albumin: 3.8 g/dL (ref 3.5–5.2)
Alkaline Phosphatase: 83 U/L (ref 39–117)
BILIRUBIN TOTAL: 0.4 mg/dL (ref 0.2–1.2)
BUN: 16 mg/dL (ref 6–23)
CHLORIDE: 99 meq/L (ref 96–112)
CO2: 36 meq/L — AB (ref 19–32)
CREATININE: 0.86 mg/dL (ref 0.40–1.20)
Calcium: 9.6 mg/dL (ref 8.4–10.5)
GFR: 68.34 mL/min (ref >60.00)
GLUCOSE: 85 mg/dL (ref 70–99)
Potassium: 4 mEq/L (ref 3.5–5.1)
SODIUM: 140 meq/L (ref 135–145)
Total Protein: 7.4 g/dL (ref 6.0–8.3)

## 2015-04-10 NOTE — Progress Notes (Signed)
Subjective:    Patient ID: Kelli Velasquez, female    DOB: April 20, 1941, 74 y.o.   MRN: [id number]  HPI Here for abdominal issue  She thinks she may have a hernia over her belly button  It sticks out/ and was "hard" at one point - a bit softer today  Noticed it last week   Doing better with nexium Cough has improved quite a bit  Is still hoarse in am - but for less time    Patient Active Problem List   Diagnosis Date Noted  . Lump in the abdomen 04/10/2015  . GERD (gastroesophageal reflux disease) 10/10/2014  . B12 deficiency 10/09/2014  . Skin lesions 10/09/2014  . Cataracts, bilateral 10/09/2014  . Dry eyes 10/09/2014  . Fatigue 05/08/2014  . Pedal edema 05/08/2014  . Shoulder tendonitis 01/24/2014  . Osteopenia 11/28/2013  . Estrogen deficiency 11/02/2013  . Dyspnea 06/27/2013  . Hernia, incisional 04/03/2013  . 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 Cough 12/18/2012  . Chronic respiratory failure with hypoxia 10/26/2012  . Special screening for malignant neoplasms, colon 06/16/2011  . DYSPHONIA 10/10/2010  . HYPERGLYCEMIA 05/27/2010  . CAD, NATIVE VESSEL 08/19/2009  . 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 Severe obesity (BMI >= 40) 09/01/2007  . MYOPIA 03/03/2007  . Hyperlipidemia 10/15/2006  . Essential hypertension 10/15/2006  . ALLERGIC RHINITIS 10/15/2006  . Cough variant asthma 10/15/2006  . OVERACTIVE BLADDER 10/15/2006  . OSTEOARTHRITIS 10/15/2006  . Urinary incontinence 10/15/2006   Past Medical History  Diagnosis Date  . Myocardial infarction     subendocardial, initial episode  . Hyperlipidemia   . Hypertension   . Obesity   . Neoplasm of skin     neoplasm of uncertain behavior of skin  . Vertigo   . Myopia   . Overactive bladder   . Urinary incontinence   . Osteoarthritis   . Asthma   . Allergy     allergic rhinitis  . Rash     and other non specific skin eruptions  . Gallstones   . Coronary  artery disease     cath January 2011 with DEs LAD and RCA  . GERD (gastroesophageal reflux disease)   . Nocturnal oxygen desaturation     o2 at night  . Wears glasses   . Full dentures    Past Surgical History  Procedure Laterality Date  . Total knee arthroplasty  2010    left , then vocal cord infection post op  . Vocal cord polypectomy    . Cholecystectomy  89  . Joint replacement  2007    right total knee replacement  . Dilation and curettage of uterus  1984  . Colonoscopy    . Coronary angioplasty with stent placement  07/2009    stent  . Incisional hernia repair N/A 05/16/2013    Procedure: HERNIA REPAIR INFRAUMILICAL INCISIONAL;  Surgeon: Joyice Faster. Cornett, MD;  Location: [facility];  Service: General;  Laterality: N/A;  umbilical  . Insertion of mesh N/A 05/16/2013    Procedure: INSERTION OF MESH;  Surgeon: Joyice Faster. Cornett, MD;  Location: [facility];  Service: General;  Laterality: N/A;  umbilical  . Video bronchoscopy Bilateral 07/05/2013    Procedure: VIDEO BRONCHOSCOPY WITH FLUORO;  Surgeon: Tanda Rockers, MD;  Location: [facility];  Service: Cardiopulmonary;  Laterality: Bilateral;   Social History  Substance Use Topics  . Smoking status: Former Smoker -- 1.00 packs/day for 25  years    Types: Cigarettes    Quit date: 07/27/1982  . Smokeless tobacco: Never Used  . Alcohol Use: 0.0 oz/week    0 Standard drinks or equivalent per week     Comment: wine-rare   Family History  Problem Relation Age of Onset  . Stroke Mother   . Stroke Father   . Colon cancer Neg Hx   . Breast cancer Maternal Grandmother   . Diabetes Mother 78   Allergies  Allergen Reactions  . Shellfish Allergy Anaphylaxis  . Aspirin     REACTION: nausea and vomiting High doses  . Atorvastatin     REACTION: leg pain  . Crestor [Rosuvastatin Calcium] Other (See Comments)    Muscle pain - allergy/intolerance  . Fish Allergy   . Simvastatin     REACTION:  muscle pain  . Trandolapril     REACTION: leg pain   Current Outpatient Prescriptions on File Prior to Visit  Medication Sig Dispense Refill  . amLODipine (NORVASC) 10 MG tablet TAKE 1 TABLET BY MOUTH DAILY. 30 tablet 5  . Ascorbic Acid (VITAMIN C) 1000 MG tablet Take 1,000 mg by mouth daily.      Marland Kitchen aspirin 81 MG tablet Take 81 mg by mouth daily.    . beclomethasone (QVAR) 80 MCG/ACT inhaler One puff twice daily (Patient taking differently: Inhale 2 puffs into the lungs daily. One puff twice daily)    . calcium-vitamin D (OSCAL WITH D 500-200) 500-200 MG-UNIT per tablet Take 1 tablet by mouth daily.      . cromolyn (NASALCROM) 5.2 MG/ACT nasal spray Place 2 sprays into the nose daily.     Marland Kitchen EPIPEN 2-PAK 0.3 MG/0.3ML SOAJ injection INJECT 0.3 MLS (0.3 MG TOTAL) INTO THE MUSCLE ONCE AS NEEDED FOR ALLERGIC REACTION 2 Device 0  . esomeprazole (NEXIUM) 40 MG capsule Take 1 capsule (40 mg total) by mouth daily. 30 capsule 11  . furosemide (LASIX) 20 MG tablet TAKE 1 TABLET BY MOUTH EVERY DAY AS NEEDED 30 tablet 2  . Multiple Vitamins-Minerals (MULTIVITAL-M) TABS Take 1 tablet by mouth daily.    . nitroGLYCERIN (NITROSTAT) 0.4 MG SL tablet Place 1 tablet (0.4 mg total) under the tongue every 5 (five) minutes as needed for chest pain. 25 tablet 6  . PROAIR HFA 108 (90 BASE) MCG/ACT inhaler INHALE 2 PUFFS BY MOUTH EVERY 4 HOURS AS NEEDED FOR WHEEZING OR FOR SHORTNESS OF BREATH 8 Inhaler 5  . ranitidine (ZANTAC) 150 MG tablet One at bedtime    . UNABLE TO FIND Med Name: b-12 injections per Dr. Glori Bickers    . ZETIA 10 MG tablet TAKE 1 TABLET (10 MG TOTAL) BY MOUTH DAILY. 30 tablet 5   No current facility-administered medications on file prior to visit.    Review of Systems Review of Systems  Constitutional: Negative for fever, appetite change, fatigue and unexpected weight change.  Eyes: Negative for pain and visual disturbance.  Respiratory: Negative for  and shortness of breath.  pos for cough that is  much improved  Cardiovascular: Negative for cp or palpitations    Gastrointestinal: Negative for nausea, diarrhea and constipation. pos for gerd symptoms that are much improved  Genitourinary: Negative for urgency and frequency.  Skin: Negative for pallor or rash   Neurological: Negative for weakness, light-headedness, numbness and headaches.  Hematological: Negative for adenopathy. Does not bruise/bleed easily.  Psychiatric/Behavioral: Negative for dysphoric mood. The patient is not nervous/anxious.  Objective:   Physical Exam  Constitutional: She appears well-developed and well-nourished. No distress.  obese and well appearing   HENT:  Head: Normocephalic and atraumatic.  Mouth/Throat: Oropharynx is clear and moist.  Eyes: Conjunctivae and EOM are normal. Pupils are equal, round, and reactive to light. No scleral icterus.  Neck: Normal range of motion. Neck supple.  Cardiovascular: Normal rate, regular rhythm and normal heart sounds.   Pulmonary/Chest: Effort normal and breath sounds normal. No respiratory distress. She has no wheezes. She has no rales.  Abdominal: Soft. Bowel sounds are normal. She exhibits mass. She exhibits no distension. There is no hepatosplenomegaly. There is no tenderness. There is no rigidity, no rebound, no guarding, no CVA tenderness, no tenderness at McBurney's point and negative Murphy's sign.  Mass - 3-4 cm approx/ firm just above umbilicus  nt  Not reducible Does inc slightly with valsalva CCY scars noted No skin changes   Lymphadenopathy:    She has no cervical adenopathy.  Neurological: She is alert.  Skin: Skin is warm and dry. No erythema. No pallor.  Psychiatric: She has a normal mood and affect.          Assessment & Plan:   Problem List Items Addressed This Visit      Other   Lump in the abdomen - Primary    In pt with remote hx of prev abd hernia repair  Lump is just above umbilicus - does inc with valsalva slt/ not  reducible and nt  Will order CT of abd/pelvis Pt will update if pain or other symptoms develop while waiting for this result       Relevant Orders   Comprehensive metabolic panel   CT Abdomen Pelvis Wo Contrast

## 2015-04-10 NOTE — Patient Instructions (Signed)
Stop at check out for referral for CT scan Labs today  If you develop pain in the area of lump please let me know

## 2015-04-10 NOTE — Progress Notes (Signed)
Pre visit review using our clinic review tool, if applicable. No additional management support is needed unless otherwise documented below in the visit note. 

## 2015-04-10 NOTE — Assessment & Plan Note (Signed)
In pt with remote hx of prev abd hernia repair  Lump is just above umbilicus - does inc with valsalva slt/ not reducible and nt  Will order CT of abd/pelvis Pt will update if pain or other symptoms develop while waiting for this result

## 2015-04-11 ENCOUNTER — Ambulatory Visit (INDEPENDENT_AMBULATORY_CARE_PROVIDER_SITE_OTHER)
Admission: RE | Admit: 2015-04-11 | Discharge: 2015-04-11 | Disposition: A | Discharge status: Home / Self Care | Payer: Medicare HMO | Source: Ambulatory Visit | Attending: Family Medicine | Admitting: Family Medicine

## 2015-04-11 DIAGNOSIS — R1900 Intra-abdominal and pelvic swelling, mass and lump, unspecified site: Secondary | ICD-10-CM

## 2015-04-11 MED ORDER — IOHEXOL 300 MG/ML  SOLN
80.0000 mL | Freq: Once | INTRAMUSCULAR | Status: AC | PRN
  Administered 2015-04-11: 80 mL via INTRAVENOUS

## 2015-04-11 NOTE — Progress Notes (Signed)
Quick Note:  Called and spoke with pt. Reviewed results and recs. Scheduled pt for ov with MW on 05/13/15.Marland Kitchen Pt voiced understanding and had no further questions. ______

## 2015-04-13 ENCOUNTER — Other Ambulatory Visit: Payer: Self-pay | Admitting: Family Medicine

## 2015-04-16 ENCOUNTER — Telehealth: Payer: Self-pay | Admitting: Family Medicine

## 2015-04-16 ENCOUNTER — Other Ambulatory Visit (INDEPENDENT_AMBULATORY_CARE_PROVIDER_SITE_OTHER): Payer: Commercial Managed Care - HMO

## 2015-04-16 ENCOUNTER — Encounter: Payer: Self-pay | Admitting: Family Medicine

## 2015-04-16 ENCOUNTER — Ambulatory Visit (INDEPENDENT_AMBULATORY_CARE_PROVIDER_SITE_OTHER)
Admission: RE | Admit: 2015-04-16 | Discharge: 2015-04-16 | Disposition: A | Discharge status: Home / Self Care | Payer: Commercial Managed Care - HMO | Source: Ambulatory Visit | Attending: Internal Medicine | Admitting: Internal Medicine

## 2015-04-16 ENCOUNTER — Ambulatory Visit (INDEPENDENT_AMBULATORY_CARE_PROVIDER_SITE_OTHER): Payer: Commercial Managed Care - HMO | Admitting: Internal Medicine

## 2015-04-16 ENCOUNTER — Encounter: Payer: Self-pay | Admitting: Internal Medicine

## 2015-04-16 VITALS — BP 132/64 | HR 52 | Ht 62.5 in | Wt 246.6 lb

## 2015-04-16 DIAGNOSIS — R918 Other nonspecific abnormal finding of lung field: Secondary | ICD-10-CM

## 2015-04-16 DIAGNOSIS — J9611 Chronic respiratory failure with hypoxia: Secondary | ICD-10-CM

## 2015-04-16 DIAGNOSIS — J45991 Cough variant asthma: Secondary | ICD-10-CM

## 2015-04-16 DIAGNOSIS — K439 Ventral hernia without obstruction or gangrene: Secondary | ICD-10-CM | POA: Insufficient documentation

## 2015-04-16 LAB — CBC WITH DIFFERENTIAL/PLATELET
Basophils Absolute: 0.1 10*3/uL (ref 0.0–0.1)
Basophils Relative: 0.4 % (ref 0.0–3.0)
EOS PCT: 3.1 % (ref 0.0–5.0)
Eosinophils Absolute: 0.4 10*3/uL (ref 0.0–0.7)
HEMATOCRIT: 44.3 % (ref 36.0–46.0)
Hemoglobin: 14.6 g/dL (ref 12.0–15.0)
LYMPHS ABS: 2.2 10*3/uL (ref 0.7–4.0)
LYMPHS PCT: 15.9 % (ref 12.0–46.0)
MCHC: 33 g/dL (ref 30.0–36.0)
MCV: 94.1 fl (ref 78.0–100.0)
MONOS PCT: 7.4 % (ref 3.0–12.0)
Monocytes Absolute: 1 10*3/uL (ref 0.1–1.0)
NEUTROS PCT: 73.2 % (ref 43.0–77.0)
Neutro Abs: 10.1 10*3/uL — ABNORMAL HIGH (ref 1.4–7.7)
Platelets: 314 10*3/uL (ref 150.0–400.0)
RBC: 4.7 Mil/uL (ref 3.87–5.11)
RDW: 16 % — ABNORMAL HIGH (ref 11.5–15.5)
WBC: 13.8 10*3/uL — ABNORMAL HIGH (ref 4.0–10.5)

## 2015-04-16 LAB — SEDIMENTATION RATE: Sed Rate: 24 mm/hr — ABNORMAL HIGH (ref 0–22)

## 2015-04-16 NOTE — Progress Notes (Signed)
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 Follow-up    Pt had PET scan done this am. Her cough and SOB have improved since the last visit.   saba inhaler helps some/ min mucoid sputum, temp to 99 but no rigors, no purulent sputum or hemoptysis/ does have some wt. Loss, no sweating.  rec Dulera 100 Take 2 puffs first thing in am and then another 2 puffs about 12 hours later.  Prednisone 10 mg take  4 each am x 2 days,   2 each am x 2 days,  1 each am x 2 days and stop Only use your albuterol as a rescue medication  We will arrange 24/7 02 at 3lpm per advanced  We will call to arrange a biopsy of the easiest area once I discuss this with radiology  Late add: Discussed in detail all the  indications, usual  risks and alternatives  relative to the benefits with patient who agrees to proceed with bronchoscopy with biopsy.   FOB > done 07/05/2013 > nl airways > neg afb/fungus/path  - IR Bx 07/11/2013 > inflammatory, special stains pending but does not look like tb or fungus and responds short course pred per pt > rec prednisone x 6 days only and needs VATS bx but declines as of 07/13/2013 > rec'd final path from [facility] review > c/w BOOP though can't exclude asp/infection sources     07/24/2013 f/u ov/Ezekial Arns re: ? Nodular BOOP Chief Complaint  Patient presents with  . Followup with CXR    Pt states that her breathing is overall doing well. She states that she is able to more activity and feels stronger.   02 2lpm when ambulatory sats in mid  90's last dose of prednisone sev weeks ago really helped sob and no worse since stopped.  No h/o arthralgias, rash, pet exposure. rec If condition worsens take pred x 6 days trial    09/06/2013 f/u ov/Jarmel Linhardt re:  02 24/7 since Dec 2014  Chief Complaint  Patient presents with  . Follow-up    Pt states that her breathing is doing well and her cough has pretty much resolved. No new co's today. Has not had to use rescue inhaler.   Finished pred Aug 11 2013 and markedly better then a little worse since few days prior to OV   = more cough white mucus, no fever. rec Please remember to go to the lab and x-ray department downstairs for your tests - we will call you with the results when they are available. Prednisone 20 mg per day until your best then reduce to 10 mg per day x one week then one half daily as tolerated, increase back up if needed. Not really clear how much asthma present > ok to change dulera to prn      03/07/2014 f/u ov/Khloey Chern re: nodular boop / pred 10 mg daily  Chief Complaint  Patient presents with  . Follow-up    Pt states  doing well and denies any co's today.   Not limited by breathing from desired activities  Except by the humdity   rec Taper pred off by around 04/09/14    04/18/2014 f/u ov/Adelynne Joerger re: nodular boop x 2 weeks off pred Chief Complaint  Patient presents with  . Follow-up    Pt stopped pred x 2 wks ago- since then c/o fatigue.    no cough, no nausea/fever/ sweats. Fatigue was bad the first week off pred and better the next  rec No change rx - leave off  pred   06/26/2014 f/u ov/Cherylin Waguespack re: f/u boop  Off pred since 04/09/14  Chief Complaint  Patient presents with  . Follow-up    productive cough white   does not think she coughed much while on prednisone but back to baseline chronic cough once stopped it   Albuterol helps some  Cough flares with rain any time of year  Tends to occur also at hs despite two h1  At hs  and pepcid at hs but total amt of mucus is  very small  rec qvar 80 Take 2 puffs first thing in am and then another 2 puffs about 12 hours later and if improve on this ok to reduce to one twice daily  Work on inhaler technique:  Ok to use the cough medicine and albuterol as needed  Would avoid fosfamax for now with alternative being reclast one injection yearly    10/01/2014 f/u ov/Tashianna Broome re: boop Off pred since 04/09/14 / asthma  on qvar 80 2 bid and very rare saba Chief Complaint  Patient presents with  . Follow-up    CXR done today. Pt states her breathing is doing well overall with minimal cough.    rec Try qvar 80 one twice daily  Please see patient coordinator before you leave today  to schedule overnight oximetry to see if ok to stop your 02 > still required it   01/02/2015 f/u ov/Vester Balthazor re: s/p boop/ cough variant asthma qva 80 one twice daily  Chief Complaint  Patient presents with  . Follow-up    Pt states breathing has improved. Pt c/o occasional prod cough with white mucus.    rec Ok to take qvar up to 2 every 12 hours or down to zero taper no more frequently than every 2 weeks  Keep working on the weight F/u prn    04/16/2015 acute extended  ov/Cezar Misiaszek re: worse  Sob but cough gone when back on qvar 80 2bid  Chief Complaint  Patient presents with  . Acute Visit    Pt c/o increased SOB x 2 wks. She had CT abd pelvis done 9/15 and was advised had MPN.    0nset was insidious, pattern is gradual=  Doe with more than slow adls/ none at rest.  No obvious day to day or daytime variabilty or assoc cough now that back on qvar 80 2bid despite poor hfa at baseline/ no  cp or chest tightness, subjective wheeze overt sinus or hb symptoms. No unusual exp hx or h/o childhood pna/ asthma or knowledge of premature birth.  Sleeping ok without nocturnal  or early am exacerbation  of respiratory  c/o's or need for noct saba. Also denies any obvious fluctuation of symptoms with weather or environmental changes or other aggravating or  alleviating factors except as outlined above   Current Medications, Allergies, Complete Past Medical History, Past Surgical History, Family History, and Social History were reviewed in [employer]  medical record.  ROS  The following are not active complaints unless bolded sore throat, dysphagia, dental problems, itching, sneezing,  nasal congestion or excess/ purulent secretions, ear ache,   fever, chills, sweats, unintended wt loss, pleuritic or exertional cp, hemoptysis,  orthopnea pnd or leg swelling, presyncope, palpitations, heartburn, abdominal pain, anorexia, nausea, vomiting, diarrhea  or change in bowel or urinary habits, change in stools or urine, dysuria,hematuria,  rash, arthralgias, visual complaints, headache, numbness weakness or ataxia or problems with walking or coordination,  change in mood/affect or memory.           PMH:  -Hx of NSTEMI.07/2009 w/  cardiac cath s/p 2 stents in LAD/ RCA       >>Echo at that time showed Mild LVH, EF of 55% and grade 1 diastolic dysfuntion  -Hyperlipidemia -HTN  -OA -s/p bilat. Knee replacements - Obesity    SH:  Retired from [employer] work Former smoker -quit 1984 -smoked x 20 1 PPD  Married  No pets  No unusual hobbies  [place] on vacation frequently (1-10/2012 -traveled to [place], [place] and [place] )    [place] :  Stroke         Objective:   Physical Exam GEN: A/Ox3; pleasant , NAD, obese WF  / amb nad   01/31/2013   237  > 07/03/2013  232 > 07/24/2013  231 > 09/06/2013  233 > 04/18/2014 256  10/18/2013  233 > 11/29/2013 238 >  03/07/2014  249 > 05/17/2014  263 > 06/26/2014 261>   10/01/2014  247 > 01/02/2015  253 >  04/16/2015 247  Wt Readings from Last 3 Encounters:  12/16/12 246 lb (111.585 kg)  11/24/12 252 lb 9.6 oz (114.579 kg)  11/10/12 245 lb 8 oz (111.358 kg)     HEENT:  [place]/AT,  EACs-clear, TMs-wnl, NOSE-clear, THROAT-clear, no lesions, no postnasal drip or exudate noted.    Edentulous   NECK:  Supple w/ fair ROM; no  JVD; normal carotid impulses w/o bruits; no thyromegaly or nodules palpated; no lymphadenopathy.  RESP    slt decreased bs bilaterally, no insp crackles or exp rhonchi  CARD:  RRR, no m/r/g -  Min   lower ext  edema, pulses intact, no cyanosis or clubbing., neg homans sign   GI:   Soft & nt; nml bowel sounds; no organomegaly or masses detected.  Musco: Warm bil, no deformities or joint swelling noted.  Bilateral knee scars        I personally reviewed images and agree with radiology impression as follows:  CXR: 04/16/2015  Bilateral nodular airspace opacities. These are new compared to chest radiograph of 10/01/2014 and may reflect a recurrent process, when compared to prior chest CT of November 2014   labs ordered / reviewed  Lab Results  Component Value Date   ESRSEDRATE 24* 04/16/2015   ESRSEDRATE 26* 09/06/2013   ESRSEDRATE 42* 07/24/2013             Assessment & Plan:

## 2015-04-16 NOTE — Patient Instructions (Signed)
Please remember to go to the lab and x-ray department downstairs for your tests - we will call you with the results when they are available.  Continue qvar 80 Take 2 puffs first thing in am and then another 2 puffs about 12 hours later.   Only use your albuterol as a rescue medication to be used if you can't catch your breath by resting or doing a relaxed purse lip breathing pattern.  - The less you use it, the better it will work when you need it.  The goal is needing it less than twice a week - Ok to use up to 2 puffs  every 4 hours if you must but call for immediate appointment if use goes up over your usual need - Don't leave home without it !!  (think of it like the spare tire for your car)   Work on inhaler technique:  relax and gently blow all the way out then take a nice smooth deep breath back in, triggering the inhaler at same time you start breathing in.  Hold for up to 5 seconds if you can. Blow out thru nose. Rinse and gargle with water when done  We will arrange follow up if needed

## 2015-04-16 NOTE — Telephone Encounter (Signed)
Ref to gen surg for hernia  Will route to [facility] Dr Brantley Stage who did prior hernia surgery on her

## 2015-04-16 NOTE — Progress Notes (Signed)
Quick Note:  Spoke with pt and notified of results per Dr. Wert. Pt verbalized understanding and denied any questions.  ______ 

## 2015-04-17 NOTE — Telephone Encounter (Signed)
Appt made with Dr Brantley Stage and patient is aware.

## 2015-04-17 NOTE — Assessment & Plan Note (Addendum)
Body mass index is 44.36 kg/(m^2).  Lab Results  Component Value Date   TSH 0.62 05/08/2014    Will be a challenge keeping under control back on pred Contributing to gerd tendency/ doe/reviewed the need and the process to achieve and maintain neg calorie balance > defer f/u primary care including intermittently monitoring thyroid status

## 2015-04-17 NOTE — Assessment & Plan Note (Signed)
11/24/2012 Office walk showed desats 86% on RA  With  spirometry FEV1 nml-73% , ratio 80   11/25/2012 CTA Chest >>neg for PE/ ILD  12/07/12 ONO > Pos 5: 79min  begin O2 2 l/m At bedtime  12/13/2012  >  Repeat 02/01/13 on 2lpm 21min> no change rx  - 01/31/2013  Walked RA x 3 laps @ 185 ft each stopped due to  End of study, no desats  - 07/03/2013 sats 75% RA >  On 3lpm sats 93%  > rx with 24 h 02 at 3lpm   - improved by self monitoring as of 07/24/13 ov so changed rx to 2lpm/24/7 humidified due to nasal dryness - 09/06/2013  Walked 2lpm x  2 laps @ 185 ft each stopped due to legs tired, no desats    - 04/18/2014  Walked RA  @ mod pace x 2 laps @ 185 ft each stopped due to  Hip pain, slow pace, sats 89% at end  - 05/16/2014  [facility] RA  2 laps @ 185 ft each stopped due to sob/fatigue but no desats off pred x 04/10/14  - 04/16/2015  Walked RA x 3 laps @ 185 ft each stopped due to   - ONO RA   10/05/2014 >  02 sat < 89% 213 min so rec resume 2lpm and work on wt loss  - 04/16/2015  Walked RA x 3 laps @ 185 ft each stopped due to  91% End of study, nl pace, no sob or desat    As of 04/16/2015  02 2lpm at hs only

## 2015-04-17 NOTE — Assessment & Plan Note (Signed)
-   PFT's 01/31/2013 wnl x low fef25-75 and 12% better fev1 p B2 - 06/26/2014 p extensive coaching HFA effectiveness =    75% . Start qvar 80 2bid  - trial of qvar 80 1 bid 10/01/14 > improved 01/02/15 > flared on low dose qvar 02/2015 > back on qvar 80 2bid rec  04/16/2015   The proper method of use, as well as anticipated side effects, of a metered-dose inhaler are discussed and demonstrated to the patient. Improved effectiveness after extensive coaching during this visit to a level of approximately  75%

## 2015-04-17 NOTE — Assessment & Plan Note (Addendum)
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 > discussed with IR, rec FOB > done 07/05/2013 > nl airways > neg afb/fungus/path  - IR Bx 07/11/2013 > inflammatory, special stains pending but does not look like tb or fungus and responds short course pred per pt > rec prednisone x 6 days only and needs VATS bx but declines as of 07/13/2013 > rec'd final path from [facility] review > c/w BOOP though can't exclude asp/infection sources - 07/19/2013 notified pt and will return 07/24/13 for cxr/ esr then consider steroid rx (needs crypto serology also) - 07/24/2013 ESR 42 off pred x 3 weeks - 07/24/13 > crypto serology neg/ Anti-CCP neg  - started maint   prednisone 09/06/2013  >>> cxr cleared 10/18/2013 on 20 mg per day > rec taper to 20/10 - 11/28/13 reduced to 10 mg daily  - 03/07/2014 5 mg x 2 weeks then 5 mg qod x 2 weeks and stopped pred  04/10/14  - ESR 04/16/2015  = 24 / recurrent nodules > rec pred 20 mg daily    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  The goal with a chronic steroid dependent illness is always arriving at the lowest effective dose that controls the disease/symptoms and not accepting a set "formula" which is based on statistics or guidelines that don't always take into account patient  variability or the natural hx of the dz in every individual patient, which may well vary over time.  For now therefore I recommend the patient maintain  Prednisone 20 mg daily and follow serial cxr, not ct to judge response.   I had an extended discussion with the patient reviewing all relevant studies completed to date and  lasting 25 minutes of a 40 minute visit    Each maintenance medication was reviewed in detail including most importantly the  difference between maintenance and prns and under what circumstances the prns are to be triggered using an action plan format that is not reflected in the computer generated alphabetically organized AVS.    Please see instructions for details which were reviewed in writing and the patient given a copy highlighting the part that I personally wrote and discussed at today's ov.

## 2015-04-30 ENCOUNTER — Ambulatory Visit (INDEPENDENT_AMBULATORY_CARE_PROVIDER_SITE_OTHER): Payer: Commercial Managed Care - HMO | Admitting: Internal Medicine

## 2015-04-30 ENCOUNTER — Encounter: Payer: Self-pay | Admitting: Internal Medicine

## 2015-04-30 VITALS — BP 118/72 | HR 87 | Ht 65.5 in | Wt 244.0 lb

## 2015-04-30 DIAGNOSIS — J9612 Chronic respiratory failure with hypercapnia: Secondary | ICD-10-CM

## 2015-04-30 DIAGNOSIS — R918 Other nonspecific abnormal finding of lung field: Secondary | ICD-10-CM | POA: Diagnosis not present

## 2015-04-30 DIAGNOSIS — J9611 Chronic respiratory failure with hypoxia: Secondary | ICD-10-CM

## 2015-04-30 DIAGNOSIS — J45991 Cough variant asthma: Secondary | ICD-10-CM

## 2015-04-30 MED ORDER — PREDNISONE 10 MG PO TABS: ORAL_TABLET | ORAL | Status: DC

## 2015-04-30 NOTE — Progress Notes (Signed)
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 Follow-up    Pt had PET scan done this am. Her cough and SOB have improved since the last visit.   saba inhaler helps some/ min mucoid sputum, temp to 99 but no rigors, no purulent sputum or hemoptysis/ does have some wt. Loss, no sweating.  rec Dulera 100 Take 2 puffs first thing in am and then another 2 puffs about 12 hours later.  Prednisone 10 mg take  4 each am x 2 days,   2 each am x 2 days,  1 each am x 2 days and stop Only use your albuterol as a rescue medication  We will arrange 24/7 02 at 3lpm per advanced  We will call to arrange a biopsy of the easiest area once I discuss this with radiology  Late add: Discussed in detail all the  indications, usual  risks and alternatives  relative to the benefits with patient who agrees to proceed with bronchoscopy with biopsy.   FOB > done 07/05/2013 > nl airways > neg afb/fungus/path  - IR Bx 07/11/2013 > inflammatory, special stains pending but does not look like tb or fungus and responds short course pred per pt > rec prednisone x 6 days only and needs VATS bx but declines as of 07/13/2013 > rec'd final path from [place] review > c/w BOOP though can't exclude asp/infection sources     07/24/2013 f/u ov/Kelli Velasquez re: ? Nodular BOOP Chief Complaint  Patient presents with  . Followup with CXR    Pt states that her breathing is overall doing well. She states that she is able to more activity and feels stronger.   02 2lpm when ambulatory sats in mid  90's last dose of prednisone sev weeks ago really helped sob and no worse since stopped.  No h/o arthralgias, rash, pet exposure. rec If condition worsens take pred x 6 days trial    09/06/2013 f/u ov/Kelli Velasquez re:  02 24/7 since Dec 2014  Chief Complaint  Patient presents with  . Follow-up    Pt states that her breathing is doing well and her cough has pretty much resolved. No new co's today. Has not had to use rescue inhaler.   Finished pred Aug 11 2013 and markedly better then a little worse since few days prior to OV   = more cough white mucus, no fever. rec Please remember to go to the lab and x-ray department downstairs for your tests - we will call you with the results when they are available. Prednisone 20 mg per day until your best then reduce to 10 mg per day x one week then one half daily as tolerated, increase back up if needed. Not really clear how much asthma present > ok to change dulera to prn      03/07/2014 f/u ov/Kelli Velasquez re: nodular boop / pred 10 mg daily  Chief Complaint  Patient presents with  . Follow-up    Pt states  doing well and denies any co's today.   Not limited by breathing from desired activities  Except by the humdity   rec Taper pred off by around 04/09/14    04/18/2014 f/u ov/Kelli Velasquez re: nodular boop x 2 weeks off pred Chief Complaint  Patient presents with  . Follow-up    Pt stopped pred x 2 wks ago- since then c/o fatigue.    no cough, no nausea/fever/ sweats. Fatigue was bad the first week off pred and better the next  rec No change rx - leave off  pred   06/26/2014 f/u ov/Kelli Velasquez re: f/u boop  Off pred since 04/09/14  Chief Complaint  Patient presents with  . Follow-up    productive cough white   does not think she coughed much while on prednisone but back to baseline chronic cough once stopped it   Albuterol helps some  Cough flares with rain any time of year  Tends to occur also at hs despite two h1  At hs  and pepcid at hs but total amt of mucus is  very small  rec qvar 80 Take 2 puffs first thing in am and then another 2 puffs about 12 hours later and if improve on this ok to reduce to one twice daily  Work on inhaler technique:  Ok to use the cough medicine and albuterol as needed  Would avoid fosfamax for now with alternative being reclast one injection yearly    10/01/2014 f/u ov/Kelli Velasquez re: boop Off pred since 04/09/14 / asthma  on qvar 80 2 bid and very rare saba Chief Complaint  Patient presents with  . Follow-up    CXR done today. Pt states her breathing is doing well overall with minimal cough.    rec Try qvar 80 one twice daily  Please see patient coordinator before you leave today  to schedule overnight oximetry to see if ok to stop your 02 > still required it   01/02/2015 f/u ov/Kelli Velasquez re: s/p boop/ cough variant asthma qva 80 one twice daily  Chief Complaint  Patient presents with  . Follow-up    Pt states breathing has improved. Pt c/o occasional prod cough with white mucus.    rec Ok to take qvar up to 2 every 12 hours or down to zero taper no more frequently than every 2 weeks  Keep working on the weight F/u prn    04/16/2015 acute extended  ov/Kelli Velasquez re: worse  Sob but cough gone when back on qvar 80 2bid  Chief Complaint  Patient presents with  . Acute Visit    Pt c/o increased SOB x 2 wks. She had CT abd pelvis done 9/15 and was advised had MPN.    0nset was insidious, pattern is gradual=  Doe with more than slow adls/ none at rest. rec Please remember to go to the lab and x-ray department downstairs for your tests - we will call you with the results when they are available. Continue qvar 80 Take 2 puffs first thing in am and then another 2 puffs about 12 hours later.  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 Follow-up    No cough or sob  with adl's but not pushing herself much at this point   No obvious day to day or daytime variabilty  or assoc    cp or chest tightness, subjective wheeze overt sinus or hb symptoms. No unusual exp hx or h/o childhood pna/ asthma or knowledge of premature birth.  Sleeping ok without nocturnal  or early am exacerbation  of respiratory  c/o's or need for noct saba. Also denies any obvious fluctuation of symptoms with weather or environmental changes or other aggravating or alleviating factors except as outlined above   Current Medications, Allergies, Complete Past Medical History, Past Surgical History, Family History, and Social History were reviewed in [employer] record.  ROS  The following are not active complaints unless bolded sore throat, dysphagia, dental problems, itching, sneezing,  nasal congestion or excess/ purulent secretions, ear ache,   fever, chills, sweats, unintended wt loss, pleuritic or exertional cp, hemoptysis,  orthopnea pnd or leg swelling, presyncope, palpitations, heartburn, abdominal pain, anorexia, nausea, vomiting, diarrhea  or change in bowel or urinary habits, change in stools or urine, dysuria,hematuria,  rash, arthralgias, visual complaints, headache, numbness weakness or ataxia or problems with walking or coordination,  change in mood/affect or memory.           PMH:  -Hx of NSTEMI.07/2009 w/  cardiac cath s/p 2 stents in LAD/ RCA       >>Echo at that time showed Mild LVH, EF of 55% and grade 1 diastolic dysfuntion  -Hyperlipidemia -HTN  -OA -s/p bilat. Knee replacements - Obesity    SH:  Retired from [employer] work Former smoker -quit 1984 -smoked x 20y x 1 PPD  Married  No pets  No unusual hobbies  [place] on vacation frequently (1-10/2012 -traveled to [place], [place] and [place] )           Objective:   Physical Exam GEN: A/Ox3; pleasant , NAD, obese WF  / amb nad but pos voice fatigue  01/31/2013   237  > 07/03/2013  232 >  07/24/2013  231 > 09/06/2013  233 > 04/18/2014 256  10/18/2013  233 > 11/29/2013 238 >  03/07/2014  249 > 05/17/2014  263 > 06/26/2014 261>   10/01/2014  247 > 01/02/2015  253 >  04/16/2015 247 >  04/30/2015  244   Vital signs reviewed   HEENT:  [place]/AT,  EACs-clear, TMs-wnl, NOSE-clear, THROAT-clear, no lesions, no postnasal drip or exudate noted.    Edentulous   NECK:  Supple w/ fair ROM; no JVD; normal carotid impulses w/o bruits; no thyromegaly or nodules palpated; no lymphadenopathy.  RESP    slt decreased bs bilaterally, no insp crackles or exp rhonchi  CARD:  RRR, no m/r/g -  Min   lower ext  edema, pulses intact, no cyanosis or clubbing., neg homans sign   GI:   Soft & nt; nml bowel sounds; no organomegaly or masses detected.  Musco: Warm bil, no deformities or joint swelling noted.  Bilateral knee scars        I personally reviewed images and agree with radiology impression as follows:  CXR: 04/16/2015  Bilateral nodular airspace opacities. These are new compared to chest radiograph of 10/01/2014 and may reflect a recurrent process, when compared to prior chest CT of November 2014     labs ordered / reviewed  Lab Results  Component Value Date   ESRSEDRATE 24* 04/16/2015   ESRSEDRATE 26* 09/06/2013   ESRSEDRATE 42* 07/24/2013             Assessment & Plan:

## 2015-04-30 NOTE — Patient Instructions (Signed)
Prednisone ceiling is 20 mg daily and floor is 10 mg alternating with 5 mg daily but don't go below this dose   If you need a biphosphonate, I recommend yearly IV reclast  Or Prolia but defer this call to Dr Glori Bickers   Please schedule a follow up office visit in 6 weeks, call sooner if needed with cxr

## 2015-05-01 ENCOUNTER — Encounter: Payer: Self-pay | Admitting: Internal Medicine

## 2015-05-01 NOTE — Assessment & Plan Note (Signed)
Body mass index is 39.97 - trending down though the pred rx a concern here   Lab Results  Component Value Date   TSH 0.62 05/08/2014     Contributing to gerd tendency/ doe/reviewed the need and the process to achieve and maintain neg calorie balance > defer f/u primary care including intermittently monitoring thyroid status

## 2015-05-01 NOTE — Assessment & Plan Note (Signed)
-   PFT's 01/31/2013 wnl x low fef25-75 and 12% better fev1 p B2 - 06/26/2014 p extensive coaching HFA effectiveness =    75% . Start qvar 80 2bid  - trial of qvar 80 1 bid 10/01/14 > improved 01/02/15 > flared on low dose qvar 02/2015 > back on qvar 80 2bid rec  04/16/2015 > improved 04/30/2015   Cough is better but she is slightly hoarse and might do just as well on Qvar one twice daily at this point

## 2015-05-01 NOTE — Assessment & Plan Note (Signed)
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 > discussed with IR, rec FOB > done 07/05/2013 > nl airways > neg afb/fungus/path  - IR Bx 07/11/2013 > inflammatory, special stains pending but does not look like tb or fungus and responds short course pred per pt > rec prednisone x 6 days only and needs VATS bx but declines as of 07/13/2013 > rec'd final path from [place] review > c/w BOOP though can't exclude asp/infection sources - 07/19/2013 notified pt and will return 07/24/13 for cxr/ esr then consider steroid rx (needs crypto serology also) - 07/24/2013 ESR 42 off pred x 3 weeks - 07/24/13 > crypto serology neg/ Anti-CCP neg  - started maint   prednisone 09/06/2013  >>> cxr cleared 10/18/2013 on 20 mg per day > rec taper to 20/10 - 11/28/13 reduced to 10 mg daily  - 03/07/2014 5 mg x 2 weeks then 5 mg qod x 2 weeks and stopped pred  04/10/14  - ESR 04/16/2015  = 24 / recurrent nodules > rec pred 20 mg daily     She has no symptoms at all now on a relatively small dose of prednisone. The goal with a chronic steroid dependent illness is always arriving at the lowest effective dose that controls the disease/symptoms and not accepting a set "formula" which is based on statistics or guidelines that don't always take into account patient  variability or the natural hx of the dz in every individual patient, which may well vary over time.  For now therefore I recommend the patient maintain  On ciling of 20 and a floor of 10/5  I had an extended discussion with the patient and husband reviewing all relevant studies completed to date and  lasting 15 to 20 minutes of a 25 minute visit    Each maintenance medication was reviewed in detail including most importantly the difference between maintenance and prns and under what circumstances the prns are to be triggered using an action plan format that is not reflected in  the computer generated alphabetically organized AVS.    Please see instructions for details which were reviewed in writing and the patient given a copy highlighting the part that I personally wrote and discussed at today's ov.

## 2015-05-01 NOTE — Assessment & Plan Note (Addendum)
11/24/2012 Office walk showed desats 86% on RA  With  spirometry FEV1 nml-73% , ratio 80   11/25/2012 CTA Chest >>neg for PE/ ILD  12/07/12 ONO > Pos 5: 69min  begin O2 2 l/m At bedtime  12/13/2012  >  Repeat 02/01/13 on 2lpm 108min> no change rx  - 01/31/2013  Walked RA x 3 laps @ 185 ft each stopped due to  End of study, no desats  - 07/03/2013 sats 75% RA >  On 3lpm sats 93%  > rx with 24 h 02 at 3lpm   - improved by self monitoring as of 07/24/13 ov so changed rx to 2lpm/24/7 humidified due to nasal dryness - 09/06/2013  Walked 2lpm x  2 laps @ 185 ft each stopped due to legs tired, no desats    - 04/18/2014  Walked RA  @ mod pace x 2 laps @ 185 ft each stopped due to  Hip pain, slow pace, sats 89% at end  - 05/16/2014  [facility] RA  2 laps @ 185 ft each stopped due to sob/fatigue but no desats off pred x 04/10/14  - 04/16/2015  Walked RA x 3 laps @ 185 ft each stopped due to   - ONO RA   10/05/2014 >  02 sat < 89% 213 min so rec resume 2lpm and work on wt loss  - 04/10/15  HCO3 36  - 04/16/2015  Walked RA x 3 laps @ 185 ft each stopped due to  91% End of study, nl pace, no sob or desat    As of  04/30/2015  02 2lpm at hs only

## 2015-05-06 ENCOUNTER — Ambulatory Visit: Payer: Self-pay | Admitting: Surgery

## 2015-05-10 ENCOUNTER — Other Ambulatory Visit: Payer: Self-pay | Admitting: *Deleted

## 2015-05-10 MED ORDER — AMLODIPINE BESYLATE 10 MG PO TABS: 10.0000 mg | ORAL_TABLET | Freq: Every day | ORAL | Status: DC

## 2015-05-13 ENCOUNTER — Ambulatory Visit: Payer: Medicare HMO | Admitting: Internal Medicine

## 2015-05-13 ENCOUNTER — Other Ambulatory Visit: Payer: Self-pay | Admitting: Family Medicine

## 2015-05-13 ENCOUNTER — Telehealth: Payer: Self-pay | Admitting: Internal Medicine

## 2015-05-13 NOTE — Telephone Encounter (Signed)
Spoke with pt. She reports insurance gave her alternative to QVAR which is flovent. Please advise thanks

## 2015-05-13 NOTE — Telephone Encounter (Signed)
flovent is the name that they gave here they told her the doc may know more [phone]

## 2015-05-13 NOTE — Telephone Encounter (Signed)
Spoke with pt. She reports she does not need this called in bc this does not become effective until her new insurance in January starts. She will call then to have flovent sent in. Nothing further needed

## 2015-05-13 NOTE — Telephone Encounter (Signed)
Called and spoke to pt. Pt stated her insurance does not cover the Qvar, informed her to call her insurance to see what the covered alternatives are then call us back. Pt verbalized understanding. Will await call.

## 2015-05-13 NOTE — Telephone Encounter (Signed)
flovent 110 2bid

## 2015-05-27 ENCOUNTER — Other Ambulatory Visit: Payer: Self-pay | Admitting: Internal Medicine

## 2015-06-04 ENCOUNTER — Ambulatory Visit (INDEPENDENT_AMBULATORY_CARE_PROVIDER_SITE_OTHER): Payer: Commercial Managed Care - HMO | Admitting: Family Medicine

## 2015-06-04 ENCOUNTER — Encounter: Payer: Self-pay | Admitting: Family Medicine

## 2015-06-04 VITALS — BP 122/64 | HR 85 | Temp 98.3°F | Ht 65.5 in | Wt 244.8 lb

## 2015-06-04 DIAGNOSIS — M8580 Other specified disorders of bone density and structure, unspecified site: Secondary | ICD-10-CM | POA: Diagnosis not present

## 2015-06-04 DIAGNOSIS — I10 Essential (primary) hypertension: Secondary | ICD-10-CM

## 2015-06-04 DIAGNOSIS — K219 Gastro-esophageal reflux disease without esophagitis: Secondary | ICD-10-CM | POA: Diagnosis not present

## 2015-06-04 MED ORDER — RANITIDINE HCL 150 MG PO TABS: ORAL_TABLET | ORAL | Status: DC

## 2015-06-04 NOTE — Progress Notes (Signed)
Subjective:    Patient ID: Kelli Velasquez, female    DOB: 01-Jun-1941, 74 y.o.   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 Can she take it twice daily  Worse globus sens after she eats  Staying away from fried foods and eating much more veg for snacks /occ pretzels Only decaf  Watches her diet  occ ice tea  No heartburn , occ burping  No problems swallowing   Bone density  Is back on prednisone with hx of osteopenia  Cannot take fosamax due to her GERD and lung problem   Never been on evista  Will change ins on Jan first  dexa 4/15   Is due for vit B12 shot in Dec   Patient Active Problem List   Diagnosis Date Noted  . Supraumbilical hernia 44/31/5400  . Lump in the abdomen 04/10/2015  . GERD (gastroesophageal reflux disease) 10/10/2014  . B12 deficiency 10/09/2014  . Skin lesions 10/09/2014  . Cataracts, bilateral 10/09/2014  . Dry eyes 10/09/2014  . Fatigue 05/08/2014  . Pedal edema 05/08/2014  . Shoulder tendonitis 01/24/2014  . Osteopenia 11/28/2013  . Estrogen deficiency 11/02/2013  . Dyspnea 06/27/2013  . Hernia, incisional 04/03/2013  . 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 Cough 12/18/2012  . Chronic respiratory failure with hypoxia and hypercapnia ([place]) 10/26/2012  . Special screening for malignant neoplasms, colon 06/16/2011  . DYSPHONIA 10/10/2010  . HYPERGLYCEMIA 05/27/2010    . CAD, NATIVE VESSEL 08/19/2009  . 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 Severe obesity (BMI >= 40) ([place]) 09/01/2007  . MYOPIA 03/03/2007  . Hyperlipidemia 10/15/2006  . Essential hypertension 10/15/2006  . ALLERGIC RHINITIS 10/15/2006  . Cough variant asthma 10/15/2006  . OVERACTIVE BLADDER 10/15/2006  . OSTEOARTHRITIS 10/15/2006  . Urinary incontinence 10/15/2006   Past Medical History  Diagnosis Date  . Myocardial infarction ([address])     subendocardial, initial episode  . Hyperlipidemia   . Hypertension   . Obesity   . Neoplasm of skin     neoplasm of uncertain behavior of skin  . Vertigo   . Myopia   . Overactive bladder   . Urinary incontinence   . Osteoarthritis   . Asthma   . Allergy     allergic rhinitis  . Rash     and other non specific skin eruptions  . Gallstones   . Coronary artery disease     cath January 2011 with DEs LAD and RCA  . GERD (gastroesophageal reflux disease)   . Nocturnal oxygen desaturation     o2 at night  . Wears glasses   . Full dentures    Past Surgical History  Procedure Laterality Date  . Total knee arthroplasty  2010    left , then vocal  cord infection post op  . Vocal cord polypectomy    . Cholecystectomy  89  . Joint replacement  2007    right total knee replacement  . Dilation and curettage of uterus  1984  . Colonoscopy    . Coronary angioplasty with stent placement  07/2009    stent  . Incisional hernia repair N/A 05/16/2013    Procedure: HERNIA REPAIR INFRAUMILICAL INCISIONAL;  Surgeon: Joyice Faster. Cornett, MD;  Location: [facility];  Service: General;  Laterality: N/A;  umbilical  . Insertion of mesh N/A 05/16/2013    Procedure: INSERTION OF MESH;  Surgeon: Joyice Faster. Cornett, MD;  Location: [facility];  Service: General;  Laterality: N/A;  umbilical  . Video bronchoscopy Bilateral 07/05/2013    Procedure: VIDEO BRONCHOSCOPY WITH FLUORO;  Surgeon: Tanda Rockers, MD;  Location: [facility];  Service: Cardiopulmonary;  Laterality: Bilateral;   Social History  Substance Use Topics  . Smoking status: Former Smoker -- 1.00 packs/day for 25 years    Types: Cigarettes    Quit date: 07/27/1982  . Smokeless tobacco: Never Used  . Alcohol Use: 0.0 oz/week    0 Standard drinks or equivalent per week     Comment: wine-rare   Family History  Problem Relation Age of Onset  . Stroke Mother   . Stroke Father   . Colon cancer Neg Hx   . Breast cancer Maternal Grandmother   . Diabetes Mother 72   Allergies  Allergen Reactions  . Shellfish Allergy Anaphylaxis  . Aspirin     REACTION: nausea and vomiting High doses  . Atorvastatin     REACTION: leg pain  . Crestor [Rosuvastatin Calcium] Other (See Comments)    Muscle pain - allergy/intolerance  . Fish Allergy   . Simvastatin     REACTION: muscle pain  . Trandolapril     REACTION: leg pain   Current Outpatient Prescriptions on File Prior to Visit  Medication Sig Dispense Refill  . amLODipine (NORVASC) 10 MG tablet Take 1 tablet (10 mg total) by mouth daily. 90 tablet 1  . Ascorbic Acid (VITAMIN C) 1000 MG tablet Take 1,000 mg by mouth daily.      Marland Kitchen aspirin 81 MG tablet Take 81 mg by mouth daily.    . beclomethasone (QVAR) 80 MCG/ACT inhaler One puff twice daily (Patient taking differently: Inhale 2 puffs into the lungs daily. One puff twice daily)    . calcium-vitamin D (OSCAL WITH D 500-200) 500-200 MG-UNIT per tablet Take 1 tablet by mouth daily.      . cromolyn (NASALCROM) 5.2 MG/ACT nasal spray Place 2 sprays into the nose daily.     . Cyanocobalamin (B-12 PO) Take 1 capsule by mouth daily.    Marland Kitchen EPIPEN 2-PAK 0.3 MG/0.3ML SOAJ injection INJECT 0.3 MLS (0.3 MG TOTAL) INTO THE MUSCLE ONCE AS NEEDED FOR ALLERGIC REACTION 2 Device 0  . esomeprazole (NEXIUM) 40 MG capsule Take 1 capsule (40 mg total) by mouth daily. 30 capsule 11  . furosemide (LASIX) 20 MG tablet TAKE 1 TABLET BY MOUTH EVERY  DAY AS NEEDED 30 tablet 5  . Multiple Vitamins-Minerals (MULTIVITAL-M) TABS Take 1 tablet by mouth daily.    . nitroGLYCERIN (NITROSTAT) 0.4 MG SL tablet Place 1 tablet (0.4 mg total) under the tongue every 5 (five) minutes as needed for chest pain. 25 tablet 6  . predniSONE (DELTASONE) 10 MG tablet TAKE ONE TABLET DAILY FOR TWO WEEKS, THEN ONE TABLET  BY MOUTH ALTERNATING WITH A HALF (Patient taking differently: 20 mg daily) 100 tablet 2  . PROAIR HFA 108 (90 BASE) MCG/ACT inhaler INHALE 2 PUFFS BY MOUTH EVERY 4 HOURS AS NEEDED FOR WHEEZING OR FOR SHORTNESS OF BREATH 8 Inhaler 5  . ranitidine (ZANTAC) 150 MG tablet One at bedtime    . UNABLE TO FIND Med Name: b-12 injections per Dr. Glori Bickers    . ZETIA 10 MG tablet TAKE 1 TABLET (10 MG TOTAL) BY MOUTH DAILY. 30 tablet 5   No current facility-administered medications on file prior to visit.       Review of Systems Review of Systems  Constitutional: Negative for fever, appetite change, fatigue and unexpected weight change.  Eyes: Negative for pain and visual disturbance.  ENT pos for globus sensation in throat-only at night  Respiratory: pos for cough and sob that are imp Cardiovascular: Negative for cp or palpitations    Gastrointestinal: Negative for nausea, diarrhea and constipation.  Genitourinary: Negative for urgency and frequency.  Skin: Negative for pallor or rash   Neurological: Negative for weakness, light-headedness, numbness and headaches.  Hematological: Negative for adenopathy. Does not bruise/bleed easily.  Psychiatric/Behavioral: Negative for dysphoric mood. The patient is not nervous/anxious.         Objective:   Physical Exam  Constitutional: She appears well-developed and well-nourished. No distress.  obese and well appearing   HENT:  Head: Normocephalic and atraumatic.  Mouth/Throat: Oropharynx is clear and moist. No oropharyngeal exudate.  Eyes: Conjunctivae and EOM are normal. Pupils are equal, round, and  reactive to light. No scleral icterus.  Neck: Normal range of motion. Neck supple. No JVD present. Carotid bruit is not present. No thyromegaly present.  Cardiovascular: Normal rate, regular rhythm, normal heart sounds and intact distal pulses.  Exam reveals no gallop.   Pulmonary/Chest: Effort normal and breath sounds normal. No respiratory distress. She has no wheezes. She has no rales.  No crackles  Harsh bs Good air exch  Abdominal: Soft. Bowel sounds are normal. She exhibits no distension, no abdominal bruit and no mass. There is no tenderness. There is no rebound and no guarding.  Baseline hernia above umbilicus that is soft and reducible  No tenderness   Musculoskeletal: She exhibits no edema.  Lymphadenopathy:    She has no cervical adenopathy.  Neurological: She is alert. She has normal reflexes.  Skin: Skin is warm and dry. No rash noted. No erythema. No pallor.  Psychiatric: She has a normal mood and affect.          Assessment & Plan:   Problem List Items Addressed This Visit      Cardiovascular and Mediastinum   Essential hypertension - Primary    bp in fair control at this time  BP Readings from Last 1 Encounters:  06/04/15 122/64   No changes needed Disc lifstyle change with low sodium diet and exercise  Labs reviewed         Digestive   GERD (gastroesophageal reflux disease)    Overall much improved with nexium 40 and ranitidine 150 once daily  However-still has globus sensation  Will inc ranitidine to bid and update if not better (consider ENT eval if not) ? If BOOP and prednisone worsen this  Enc wt loss as well       Relevant Medications   ranitidine (ZANTAC) 150 MG tablet     Musculoskeletal and Integument   Osteopenia    Pt is back on prednisone  Cannot  take fosamax due to uncontrolled gerd and throat/resp issues  Disc other options incl evista and prolia  She will check on ins cov in the new year Disc need for calcium/ vitamin D/ wt  bearing exercise and bone density test every 2 y to monitor Disc safety/ fracture risk in detail

## 2015-06-04 NOTE — Progress Notes (Signed)
Pre visit review using our clinic review tool, if applicable. No additional management support is needed unless otherwise documented below in the visit note. 

## 2015-06-04 NOTE — Patient Instructions (Signed)
Increase zantac to twice daily am and pm  Also continue nexium  Take your calcium and vitamin D The medications I would consider for osteoporosis are Prolia and evista  Take a look at your coverage and let me know what you think   Make a nurse visit appt in Dec for B12 shot

## 2015-06-05 NOTE — Assessment & Plan Note (Signed)
Overall much improved with nexium 40 and ranitidine 150 once daily  However-still has globus sensation  Will inc ranitidine to bid and update if not better (consider ENT eval if not) ? If BOOP and prednisone worsen this  Enc wt loss as well

## 2015-06-05 NOTE — Assessment & Plan Note (Signed)
Pt is back on prednisone  Cannot take fosamax due to uncontrolled gerd and throat/resp issues  Disc other options incl evista and prolia  She will check on ins cov in the new year Disc need for calcium/ vitamin D/ wt bearing exercise and bone density test every 2 y to monitor Disc safety/ fracture risk in detail

## 2015-06-05 NOTE — Assessment & Plan Note (Signed)
bp in fair control at this time  BP Readings from Last 1 Encounters:  06/04/15 122/64   No changes needed Disc lifstyle change with low sodium diet and exercise  Labs reviewed

## 2015-06-06 ENCOUNTER — Other Ambulatory Visit: Payer: Self-pay | Admitting: *Deleted

## 2015-06-06 MED ORDER — EZETIMIBE 10 MG PO TABS: ORAL_TABLET | ORAL | Status: DC

## 2015-06-10 ENCOUNTER — Other Ambulatory Visit: Payer: Self-pay | Admitting: Family Medicine

## 2015-06-14 ENCOUNTER — Encounter: Payer: Self-pay | Admitting: Internal Medicine

## 2015-06-14 ENCOUNTER — Ambulatory Visit (INDEPENDENT_AMBULATORY_CARE_PROVIDER_SITE_OTHER)
Admission: RE | Admit: 2015-06-14 | Discharge: 2015-06-14 | Disposition: A | Discharge status: Home / Self Care | Payer: Commercial Managed Care - HMO | Source: Ambulatory Visit | Attending: Internal Medicine | Admitting: Internal Medicine

## 2015-06-14 ENCOUNTER — Ambulatory Visit (INDEPENDENT_AMBULATORY_CARE_PROVIDER_SITE_OTHER): Payer: Commercial Managed Care - HMO | Admitting: Internal Medicine

## 2015-06-14 ENCOUNTER — Telehealth: Payer: Self-pay | Admitting: *Deleted

## 2015-06-14 VITALS — BP 138/88 | HR 91 | Ht 65.5 in | Wt 245.6 lb

## 2015-06-14 DIAGNOSIS — R918 Other nonspecific abnormal finding of lung field: Secondary | ICD-10-CM | POA: Diagnosis not present

## 2015-06-14 DIAGNOSIS — J45991 Cough variant asthma: Secondary | ICD-10-CM

## 2015-06-14 DIAGNOSIS — J9611 Chronic respiratory failure with hypoxia: Secondary | ICD-10-CM

## 2015-06-14 DIAGNOSIS — J9612 Chronic respiratory failure with hypercapnia: Secondary | ICD-10-CM

## 2015-06-14 NOTE — Assessment & Plan Note (Signed)
-   PFT's 01/31/2013 wnl x low fef25-75 and 12% better fev1 p B2 - 06/26/2014 p extensive coaching HFA effectiveness =    75% . Start qvar 80 2bid  - trial of qvar 80 1 bid 10/01/14 > improved 01/02/15 > flared on low dose qvar 02/2015 > back on qvar 80 2bid rec  04/16/2015 > improved 04/30/2015   No coughing at present but it may be some of her symptoms we're treating with systemic steroids would improve with max ics so rec 2bid dosing

## 2015-06-14 NOTE — Assessment & Plan Note (Signed)
Body mass index is 40.23 trending up on steroids   Lab Results  Component Value Date   TSH 0.62 05/08/2014     Contributing to gerd tendency/ doe/reviewed the need and the process to achieve and maintain neg calorie balance > defer f/u primary care including intermittently monitoring thyroid status

## 2015-06-14 NOTE — Progress Notes (Signed)
Subjective:   Patient ID: Kelli Velasquez, female    DOB: 01-14-1941   MRN: [id number]   Brief patient profile:  16 yowf quit smoking in 1984 at wt 160- 180   with h/o CAD, HTN, OA and obesity presents for an initial pulmonary consult for cough x 6 weeks and recurrent bronchitis flares x 1 year ~10/2011 referred by [facility] with nodular lung dz ? Etiology c/w BOOP clinically      History of Present Illness  07/03/2013 f/u ov/Wert re: sob/cough/ wheeze/ MPN Chief Complaint  Patient presents with  . Follow-up    Pt had PET scan done this am. Her cough and SOB have improved since the last visit.   saba inhaler helps some/ min mucoid sputum, temp to 99 but no rigors, no purulent sputum or hemoptysis/ does have some wt. Loss, no sweating.  rec Dulera 100 Take 2 puffs first thing in am and then another 2 puffs about 12 hours later.  Prednisone 10 mg take  4 each am x 2 days,   2 each am x 2 days,  1 each am x 2 days and stop Only use your albuterol as a rescue medication  We will arrange 24/7 02 at 3lpm per advanced  We will call to arrange a biopsy of the easiest area once I discuss this with radiology  Late add: Discussed in detail all the  indications, usual  risks and alternatives  relative to the benefits with patient who agrees to proceed with bronchoscopy with biopsy.   FOB > done 07/05/2013 > nl airways > neg afb/fungus/path  - IR Bx 07/11/2013 > inflammatory, special stains pending but does not look like tb or fungus and responds short course pred per pt > rec prednisone x 6 days only and needs VATS bx but declines as of 07/13/2013 > rec'd final path from [facility] review > c/w BOOP though can't exclude asp/infection sources     07/24/2013 f/u ov/Wert re: ? Nodular BOOP Chief Complaint  Patient presents with  . Followup with CXR    Pt states that her breathing is overall doing well. She states that she is able to more activity and feels stronger.   02 2lpm when ambulatory sats in mid  90's last dose of prednisone sev weeks ago really helped sob and no worse since stopped.  No h/o arthralgias, rash, pet exposure. rec If condition worsens take pred x 6 days trial    09/06/2013 f/u ov/Wert re:  02 24/7 since Dec 2014  Chief Complaint  Patient presents with  . Follow-up    Pt states that her breathing is doing well and her cough has pretty much resolved. No new co's today. Has not had to use rescue inhaler.   Finished pred Aug 11 2013 and markedly better then a little worse since few days prior to OV   = more cough white mucus, no fever. rec Please remember to go to the lab and x-ray department downstairs for your tests - we will call you with the results when they are available. Prednisone 20 mg per day until your best then reduce to 10 mg per day x one week then one half daily as tolerated, increase back up if needed. Not really clear how much asthma present > ok to change dulera to prn      03/07/2014 f/u ov/Wert re: nodular boop / pred 10 mg daily  Chief Complaint  Patient presents with  . Follow-up    Pt states  doing well and denies any co's today.   Not limited by breathing from desired activities  Except by the humdity   rec Taper pred off by around 04/09/14    04/18/2014 f/u ov/Wert re: nodular boop x 2 weeks off pred Chief Complaint  Patient presents with  . Follow-up    Pt stopped pred x 2 wks ago- since then c/o fatigue.    no cough, no nausea/fever/ sweats. Fatigue was bad the first week off pred and better the next  rec No change rx - leave off  pred   06/26/2014 f/u ov/Wert re: f/u boop  Off pred since 04/09/14  Chief Complaint  Patient presents with  . Follow-up    productive cough white   does not think she coughed much while on prednisone but back to baseline chronic cough once stopped it   Albuterol helps some  Cough flares with rain any time of year  Tends to occur also at hs despite two h1  At hs  and pepcid at hs but total amt of mucus is  very small  rec qvar 80 Take 2 puffs first thing in am and then another 2 puffs about 12 hours later and if improve on this ok to reduce to one twice daily  Work on inhaler technique:  Ok to use the cough medicine and albuterol as needed  Would avoid fosfamax for now with alternative being reclast one injection yearly    10/01/2014 f/u ov/Wert re: boop Off pred since 04/09/14 / asthma  on qvar 80 2 bid and very rare saba Chief Complaint  Patient presents with  . Follow-up    CXR done today. Pt states her breathing is doing well overall with minimal cough.    rec Try qvar 80 one twice daily  Please see patient coordinator before you leave today  to schedule overnight oximetry to see if ok to stop your 02 > still required it   01/02/2015 f/u ov/Wert re: s/p boop/ cough variant asthma qva 80 one twice daily  Chief Complaint  Patient presents with  . Follow-up    Pt states breathing has improved. Pt c/o occasional prod cough with white mucus.    rec Ok to take qvar up to 2 every 12 hours or down to zero taper no more frequently than every 2 weeks  Keep working on the weight F/u prn    04/16/2015 acute extended  ov/Wert re: worse  Sob but cough gone when back on qvar 80 2bid  Chief Complaint  Patient presents with  . Acute Visit    Pt c/o increased SOB x 2 wks. She had CT abd pelvis done 9/15 and was advised had MPN.    0nset was insidious, pattern is gradual=  Doe with more than slow adls/ none at rest. rec Please remember to go to the lab and x-ray department downstairs for your tests - we will call you with the results when they are available. Continue qvar 80 Take 2 puffs first thing in am and then another 2 puffs about 12 hours later.  Only use your albuterol as a rescue medication    04/30/2015  f/u ov/Wert re: nodular boop back on prednisone since 04/16/15  With symptoms starting later part of August 2016  Chief Complaint  Patient presents with  . Follow-up  rec Prednisone  ceiling is 20 mg daily and floor is 10 mg alternating with 5 mg daily but don't go below this dose  If you  need a biphosphonate, I recommend yearly IV reclast  Or Prolia but defer this call to Dr Glori Bickers    06/14/2015  f/u ov/Wert re: BOOP on prednisone 20 mg daily and qvar 80 one bid  Chief Complaint  Patient presents with  . Follow-up    Pt following for pulmonary nodules c/w nodular boop with CXR today:  pt states she is doing well. pt states breathing is good most of the time, sometimes she has a SOB. no c/o coughing,wheezing or chest tightness. pt sttaes 2 days after being on the 10 mg of prednisone she experiences breathing problems       No cough or sob with adl's but not pushing herself much at this point > limited by hip / breathing much worse on 10 but better on 20   No obvious day to day or daytime variabilty or assoc    cp or chest tightness, subjective wheeze overt sinus or hb symptoms. No unusual exp hx or h/o childhood pna/ asthma or knowledge of premature birth.  Sleeping ok without nocturnal  or early am exacerbation  of respiratory  c/o's or need for noct saba. Also denies any obvious fluctuation of symptoms with weather or environmental changes or other aggravating or alleviating factors except as outlined above   Current Medications, Allergies, Complete Past Medical History, Past Surgical History, Family History, and Social History were reviewed in [facility] record.  ROS  The following are not active complaints unless bolded sore throat, dysphagia, dental problems, itching, sneezing,  nasal congestion or excess/ purulent secretions, ear ache,   fever, chills, sweats, unintended wt loss, pleuritic or exertional cp, hemoptysis,  orthopnea pnd or leg swelling, presyncope, palpitations, heartburn, abdominal pain, anorexia, nausea, vomiting, diarrhea  or change in bowel or urinary habits, change in stools or urine, dysuria,hematuria,  rash, arthralgias,  visual complaints, headache, numbness weakness or ataxia or problems with walking or coordination,  change in mood/affect or memory.           PMH:  -Hx of NSTEMI.07/2009 w/  cardiac cath s/p 2 stents in LAD/ RCA       >>Echo at that time showed Mild LVH, EF of 55% and grade 1 diastolic dysfuntion  -Hyperlipidemia -HTN  -OA -s/p bilat. Knee replacements - Obesity    SH:  Retired from [employer] work Former smoker -quit 1984 -smoked x 20y x 1 PPD  Married  No pets  No unusual hobbies  [place] on vacation frequently (1-10/2012 -traveled to [place], [place] and [place] )           Objective:   Physical Exam GEN: A/Ox3; pleasant , NAD, obese WF  / amb nad    01/31/2013   237  > 07/03/2013  232 > 07/24/2013  231 > 09/06/2013  233 > 04/18/2014 256  10/18/2013  233 > 11/29/2013 238 >  03/07/2014  249 > 05/17/2014  263 > 06/26/2014 261>   10/01/2014  247 > 01/02/2015  253 >  04/16/2015 247 >  04/30/2015  244 > 06/14/2015   246  Vital signs reviewed   HEENT:  [place]/AT,  EACs-clear, TMs-wnl, NOSE-clear, THROAT-clear, no lesions, no postnasal drip or exudate noted.    Edentulous   NECK:  Supple w/ fair ROM; no JVD; normal carotid impulses w/o bruits; no thyromegaly or nodules palpated; no lymphadenopathy.  RESP    slt decreased bs bilaterally, no insp crackles or exp rhonchi  CARD:  RRR, no m/r/g -  Min  lower ext  edema, pulses intact, no cyanosis or clubbing., neg homans sign   GI:   Soft & nt; nml bowel sounds; no organomegaly or masses detected.  Musco: Warm bil, no deformities or joint swelling noted.  Bilateral knee scars        I personally reviewed images and agree with radiology impression as follows:  CXR:  06/14/2015  No active cardiopulmonary disease.    labs  reviewed  Lab Results  Component Value Date   ESRSEDRATE 24* 04/16/2015   ESRSEDRATE 26* 09/06/2013   ESRSEDRATE 42* 07/24/2013             Assessment & Plan:

## 2015-06-14 NOTE — Telephone Encounter (Signed)
Pt returning call.Kelli Velasquez ° °

## 2015-06-14 NOTE — Telephone Encounter (Signed)
-----   Message from Tanda Rockers, MD sent at 06/14/2015  9:45 AM EST ----- See if using noct 02 still at what rate for med list

## 2015-06-14 NOTE — Telephone Encounter (Signed)
Called spoke with pt spouse. He reports pt is still using noct O2 at 2 liters. FYI to MW.

## 2015-06-14 NOTE — Telephone Encounter (Signed)
LMTCB

## 2015-06-14 NOTE — Assessment & Plan Note (Addendum)
11/24/2012 Office walk showed desats 86% on RA  With  spirometry FEV1 nml-73% , ratio 80   11/25/2012 CTA Chest >>neg for PE/ ILD  12/07/12 ONO > Pos 5: 11min  begin O2 2 l/m At bedtime  12/13/2012  >  Repeat 02/01/13 on 2lpm 58min> no change rx  - 01/31/2013  Walked RA x 3 laps @ 185 ft each stopped due to  End of study, no desats  - 07/03/2013 sats 75% RA >  On 3lpm sats 93%  > rx with 24 h 02 at 3lpm   - improved by self monitoring as of 07/24/13 ov so changed rx to 2lpm/24/7 humidified due to nasal dryness - 09/06/2013  Walked 2lpm x  2 laps @ 185 ft each stopped due to legs tired, no desats    - 04/18/2014  Walked RA  @ mod pace x 2 laps @ 185 ft each stopped due to  Hip pain, slow pace, sats 89% at end  - 05/16/2014  [facility] RA  2 laps @ 185 ft each stopped due to sob/fatigue but no desats off pred x 04/10/14  - 04/16/2015  Walked RA x 3 laps @ 185 ft each stopped due to   - ONO RA   10/05/2014 >  02 sat < 89% 213 min so rec resume 2lpm and work on wt loss  - 04/10/15  HCO3 36  - 04/16/2015  Walked RA x 3 laps @ 185 ft each stopped due to  91% End of study, nl pace, no sob or desat    As of 06/14/2015  02 2lpm at hs only  > continue until losessign wt then maybe can recheck ono RA

## 2015-06-14 NOTE — Assessment & Plan Note (Addendum)
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 - 07/19/2013 notified pt and will return 07/24/13 for cxr/ esr then consider steroid rx (needs crypto serology also) - 07/24/2013 ESR 42 off pred x 3 weeks - 07/24/13 > crypto serology neg/ Anti-CCP neg  - started maint   prednisone 09/06/2013  >>> cxr cleared 10/18/2013 on 20 mg per day > rec taper to 20/10 - 11/28/13 reduced to 10 mg daily  - 03/07/2014 5 mg x 2 weeks then 5 mg qod x 2 weeks and stopped pred  04/10/14  - ESR 04/16/2015  = 24 / recurrent nodules > rec pred 20 mg daily > nodules resolved on f/u cxr 06/14/2015  - 04/30/2015 rec ceiling of 20 and floor of 10/5   The goal with a chronic steroid dependent illness is always arriving at the lowest effective dose that controls the disease/symptoms and not accepting a set "formula" which is based on statistics or guidelines that don't always take into account patient  variability or the natural hx of the dz in every individual patient, which may well vary over time.  For now therefore I recommend the patient maintain  Ceiling of 20 and floor of 10/5 and use ics @ 2 bid as taper to see if any of the symptoms do better on topical steroids (which would indicate a likely separate dz as boop doesn't usually affect the airway    I had an extended discussion with the patient reviewing all relevant studies completed to date and  lasting 15 to 20 minutes of a 25 minute visit    Each maintenance medication was reviewed in detail  including most importantly the difference between maintenance and prns and under what circumstances the prns are to be triggered using an action plan format that is not reflected in the computer generated alphabetically organized AVS.    Please see instructions for details which were reviewed in writing and the patient given a copy highlighting the part that I personally wrote and discussed at today's ov.

## 2015-06-14 NOTE — Patient Instructions (Signed)
Ok to wean prednisone to the lowest dose that works but not below 10 mg alternating with 5 mg (your floor) and not above 20 mg daily (your ceiling)   Follow up with me when you return from [place] in March

## 2015-07-02 ENCOUNTER — Ambulatory Visit: Payer: Commercial Managed Care - HMO

## 2015-07-09 ENCOUNTER — Ambulatory Visit (INDEPENDENT_AMBULATORY_CARE_PROVIDER_SITE_OTHER): Payer: Commercial Managed Care - HMO

## 2015-07-09 DIAGNOSIS — E538 Deficiency of other specified B group vitamins: Secondary | ICD-10-CM | POA: Diagnosis not present

## 2015-07-09 MED ORDER — CYANOCOBALAMIN 1000 MCG/ML IJ SOLN
1000.0000 ug | Freq: Once | INTRAMUSCULAR | Status: AC
  Administered 2015-07-09: 1000 ug via INTRAMUSCULAR

## 2015-07-10 ENCOUNTER — Telehealth: Payer: Self-pay | Admitting: Internal Medicine

## 2015-07-10 NOTE — Telephone Encounter (Signed)
If not better p 5 days of prednisone 20 mg daily will need to be seen here or by primary or UC as that's usually plenty to get her back on her feet

## 2015-07-10 NOTE — Telephone Encounter (Signed)
Called pt and informed her of MW rec  Pt voiced understanding of rec and stated she would contact her primary if not better soon  Nothing further is needed

## 2015-07-10 NOTE — Telephone Encounter (Signed)
Spoke with pt. States she started decreasing her prednisone the way MW instructed her to. She had to go back to taking 20mg  daily. Reports having lots of congestion, SOB and fatigue. Would MW's recommendations on what to do from here.  MW - please advise. Thanks.

## 2015-07-12 ENCOUNTER — Telehealth: Payer: Self-pay | Admitting: Internal Medicine

## 2015-07-12 MED ORDER — PREDNISONE 10 MG PO TABS: ORAL_TABLET | ORAL | Status: DC

## 2015-07-12 MED ORDER — FLUTICASONE PROPIONATE HFA 110 MCG/ACT IN AERO
2.0000 | INHALATION_SPRAY | Freq: Two times a day (BID) | RESPIRATORY_TRACT | Status: DC
Start: 2015-07-12 — End: 2016-02-04

## 2015-07-12 NOTE — Telephone Encounter (Signed)
Spoke with pt and is aware of below. RX's sent in. Nothing further needed

## 2015-07-12 NOTE — Telephone Encounter (Signed)
Spoke with pt, states she is currently taking qvar but her insurance will not cover this as of 07/28/15.  Insurance will cover flovent instead.  Pt requesting to go ahead and switch to flovent now.   Pt also requesting refill on prednisone.  Pt currently taking 20mg  qd.  [facility] uses [facility] [address] in [place].  MW please advise if you're ok with switching inhalers and on the pred refill.  Thanks!

## 2015-07-12 NOTE — Telephone Encounter (Signed)
Ok pred 10 mg # 100 flovent 110 hfa x one year

## 2015-08-16 DIAGNOSIS — R918 Other nonspecific abnormal finding of lung field: Secondary | ICD-10-CM | POA: Diagnosis not present

## 2015-08-16 DIAGNOSIS — R0902 Hypoxemia: Secondary | ICD-10-CM | POA: Diagnosis not present

## 2015-08-16 DIAGNOSIS — J45909 Unspecified asthma, uncomplicated: Secondary | ICD-10-CM | POA: Diagnosis not present

## 2015-09-12 DIAGNOSIS — J209 Acute bronchitis, unspecified: Secondary | ICD-10-CM | POA: Diagnosis not present

## 2015-09-16 DIAGNOSIS — R0902 Hypoxemia: Secondary | ICD-10-CM | POA: Diagnosis not present

## 2015-09-16 DIAGNOSIS — J45909 Unspecified asthma, uncomplicated: Secondary | ICD-10-CM | POA: Diagnosis not present

## 2015-09-16 DIAGNOSIS — R918 Other nonspecific abnormal finding of lung field: Secondary | ICD-10-CM | POA: Diagnosis not present

## 2015-09-17 DIAGNOSIS — R05 Cough: Secondary | ICD-10-CM | POA: Diagnosis not present

## 2015-09-23 ENCOUNTER — Encounter: Payer: Self-pay | Admitting: Internal Medicine

## 2015-09-23 ENCOUNTER — Ambulatory Visit (INDEPENDENT_AMBULATORY_CARE_PROVIDER_SITE_OTHER): Payer: Medicare Other | Admitting: Internal Medicine

## 2015-09-23 VITALS — BP 112/80 | HR 76 | Ht 65.5 in | Wt 254.0 lb

## 2015-09-23 DIAGNOSIS — J45991 Cough variant asthma: Secondary | ICD-10-CM | POA: Diagnosis not present

## 2015-09-23 DIAGNOSIS — R918 Other nonspecific abnormal finding of lung field: Secondary | ICD-10-CM

## 2015-09-23 DIAGNOSIS — J9611 Chronic respiratory failure with hypoxia: Secondary | ICD-10-CM

## 2015-09-23 DIAGNOSIS — J9612 Chronic respiratory failure with hypercapnia: Secondary | ICD-10-CM

## 2015-09-23 MED ORDER — FLUTTER DEVI
Status: DC
Start: 2015-09-23 — End: 2017-03-17

## 2015-09-23 NOTE — Patient Instructions (Addendum)
When coughing take zantac after supper and bedtime and when not just take it at  Bedtime x 150   Cough into the flutter valve to prevent airway trauma  Reduce the prednisone to 20/10 x one week, then 10 x one week then 10/5 until return 6 weeks

## 2015-09-23 NOTE — Assessment & Plan Note (Signed)
-   PFT's 01/31/2013 wnl x low fef25-75 and 12% better fev1 p B2 - 06/26/2014 p extensive coaching HFA effectiveness =    75% . Start qvar 80 2bid  - trial of qvar 80 1 bid 10/01/14 > improved 01/02/15 > flared on low dose qvar 02/2015 > back on qvar 80 2bid rec  04/16/2015 > improved 04/30/2015  - added flutter 09/23/2015 >>>  - PFT's 01/31/2013 wnl x low fef25-75 and 12% better fev1 p B2 - 06/26/2014 p extensive coaching HFA effectiveness =    75% . 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   These are the same pts (now being labeled as having "irritable larynx syndrome" by some cough centers) who not infrequently have a history of having failed to tolerate ace inhibitors,  dry powder inhalers or biphosphonates or report having atypical reflux symptoms that don't respond to standard doses of PPI , and are easily confused as having aecopd or asthma flares by even experienced allergists/ pulmonologists.  rec max rx for gerd/ cyclical cough > see avs

## 2015-09-23 NOTE — Assessment & Plan Note (Signed)
11/24/2012 Office walk showed desats 86% on RA  With  spirometry FEV1 nml-73% , ratio 80   11/25/2012 CTA Chest >>neg for PE/ ILD  12/07/12 ONO > Pos 5: 39min  begin O2 2 l/m At bedtime  12/13/2012  >  Repeat 02/01/13 on 2lpm 79min> no change rx  - 01/31/2013  Walked RA x 3 laps @ 185 ft each stopped due to  End of study, no desats  - 07/03/2013 sats 75% RA >  On 3lpm sats 93%  > rx with 24 h 02 at 3lpm   - improved by self monitoring as of 07/24/13 ov so changed rx to 2lpm/24/7 humidified due to nasal dryness - 09/06/2013  Walked 2lpm x  2 laps @ 185 ft each stopped due to legs tired, no desats    - 04/18/2014  Walked RA  @ mod pace x 2 laps @ 185 ft each stopped due to  Hip pain, slow pace, sats 89% at end  - 05/16/2014  [facility] RA  2 laps @ 185 ft each stopped due to sob/fatigue but no desats off pred x 04/10/14  - 04/16/2015  Walked RA x 3 laps @ 185 ft each stopped due to   - ONO RA   10/05/2014 >  02 sat < 89% 213 min so rec resume 2lpm and work on wt loss  - 04/10/15  HCO3 36  - 04/16/2015  Walked RA x 3 laps @ 185 ft each stopped due to  91% End of study, nl pace, no sob or desat    As of 09/23/2015  02 2lpm at hs only

## 2015-09-23 NOTE — Progress Notes (Signed)
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 Follow-up    Pt had PET scan done this am. Her cough and SOB have improved since the last visit.   saba inhaler helps some/ min mucoid sputum, temp to 99 but no rigors, no purulent sputum or hemoptysis/ does have some wt. Loss, no sweating.  rec Dulera 100 Take 2 puffs first thing in am and then another 2 puffs about 12 hours later.  Prednisone 10 mg take  4 each am x 2 days,   2 each am x 2 days,  1 each am x 2 days and stop Only use your albuterol as a rescue medication  We will arrange 24/7 02 at 3lpm per advanced  We will call to arrange a biopsy of the easiest area once I discuss this with radiology  Late add: Discussed in detail all the  indications, usual  risks and alternatives  relative to the benefits with patient who agrees to proceed with bronchoscopy with biopsy.   FOB > done 07/05/2013 > nl airways > neg afb/fungus/path  - IR Bx 07/11/2013 > inflammatory, special stains pending but does not look like tb or fungus and responds short course pred per pt > rec prednisone x 6 days only and needs VATS bx but declines as of 07/13/2013 > rec'd final path from [facility] review > c/w BOOP though can't exclude asp/infection sources     07/24/2013 f/u ov/Kelli Velasquez re: ? Nodular BOOP Chief Complaint  Patient presents with  . Followup with CXR    Pt states that her breathing is overall doing well. She states that she is able to more activity and feels stronger.   02 2lpm when ambulatory sats in mid  90's last dose of prednisone sev weeks ago really helped sob and no worse since stopped.  No h/o arthralgias, rash, pet exposure. rec If condition worsens take pred x 6 days trial    09/06/2013 f/u ov/Kelli Velasquez re:  02 24/7 since Dec 2014  Chief Complaint  Patient presents with  . Follow-up    Pt states that her breathing is doing well and her cough has pretty much resolved. No new co's today. Has not had to use rescue inhaler.   Finished pred Aug 11 2013 and markedly better then a little worse since few days prior to OV   = more cough white mucus, no fever. rec Please remember to go to the lab and x-ray department downstairs for your tests - we will call you with the results when they are available. Prednisone 20 mg per day until your best then reduce to 10 mg per day x one week then one half daily as tolerated, increase back up if needed. 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 Follow-up    Pt states  doing well and denies any co's today.   Not limited by breathing from desired activities  Except by the humdity   rec Taper pred off by around 04/09/14    04/18/2014 f/u ov/Kelli Velasquez re: nodular boop x 2 weeks off pred Chief Complaint  Patient presents with  . Follow-up    Pt stopped pred x 2 wks ago- since then c/o fatigue.    no cough, no nausea/fever/ sweats. Fatigue was bad the first week off pred and better the next  rec No change rx - leave off  pred   06/26/2014 f/u ov/Kelli Velasquez re: f/u boop  Off pred since 04/09/14  Chief Complaint  Patient presents with  . Follow-up    productive cough white   does not think she coughed much while on prednisone but back to baseline chronic cough once stopped it   Albuterol helps some  Cough flares with rain any time of year  Tends to occur also at hs despite two h1  At hs  and pepcid at hs but total amt of mucus is  very small  rec qvar 80 Take 2 puffs first thing in am and then another 2 puffs about 12 hours later and if improve on this ok to reduce to one twice daily  Work on inhaler technique:  Ok to use the cough medicine and albuterol as needed  Would avoid fosfamax for now with alternative being reclast one injection yearly    10/01/2014 f/u ov/Kelli Velasquez re: boop Off pred since 04/09/14 / asthma  on qvar 80 2 bid and very rare saba Chief Complaint  Patient presents with  . Follow-up    CXR done today. Pt states her breathing is doing well overall with minimal cough.    rec Try qvar 80 one twice daily  Please see patient coordinator before you leave today  to schedule overnight oximetry to see if ok to stop your 02 > still required it   01/02/2015 f/u ov/Kelli Velasquez re: s/p boop/ cough variant asthma qva 80 one twice daily  Chief Complaint  Patient presents with  . Follow-up    Pt states breathing has improved. Pt c/o occasional prod cough with white mucus.    rec Ok to take qvar up to 2 every 12 hours or down to zero taper no more frequently than every 2 weeks  Keep working on the weight F/u prn    04/16/2015 acute extended  ov/Kelli Velasquez re: worse  Sob but cough gone when back on qvar 80 2bid  Chief Complaint  Patient presents with  . Acute Visit    Pt c/o increased SOB x 2 wks. She had CT abd pelvis done 9/15 and was advised had MPN.    0nset was insidious, pattern is gradual=  Doe with more than slow adls/ none at rest. rec Please remember to go to the lab and x-ray department downstairs for your tests - we will call you with the results when they are available. Continue qvar 80 Take 2 puffs first thing in am and then another 2 puffs about 12 hours later.  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 Follow-up  rec Prednisone  ceiling is 20 mg daily and floor is 10 mg alternating with 5 mg daily but don't go below this dose  If you  need a biphosphonate, I recommend yearly IV reclast  Or Prolia but defer this call to Dr Glori Bickers    06/14/2015  f/u ov/Kailer Heindel re: BOOP on prednisone 20 mg daily and qvar 80 one bid  Chief Complaint  Patient presents with  . Follow-up    Pt following for pulmonary nodules c/w nodular boop with CXR today:  pt states she is doing well. pt states breathing is good most of the time, sometimes she has a SOB. no c/o coughing,wheezing or chest tightness. pt sttaes 2 days after being on the 10 mg of prednisone she experiences breathing problems   rec Ok to wean prednisone to the lowest dose that works but not below 10 mg alternating with 5 mg (your floor) and not above 20 mg daily (your ceiling)     09/23/2015  f/u ov/Cristella Stiver re: steroid dep boop? bronchopna in [place] Complaint  Patient presents with  . Follow-up    Pt c/o for the past 2 wks. Cough is non prod. She has had 2 rounds of abx. She is using albuterol 3 x daily on average.    onset 9th of feb 2017 while on  10/5 pred  and flovent110  2 bid then ? Caught cold from fm member first zpak then levaquin finished one day prior to OV and has maintained on pred 20 mg since but persistent harsh honking quality upper airway coughing ongoing/ codeine dependent/ mucus is all white now  Confused with how to use ppi and h2    No  sob with adl's but not pushing herself much at this point    No obvious day to day or daytime variabilty or assoc    cp or chest tightness, subjective wheeze overt sinus or hb symptoms. No unusual exp hx or h/o childhood pna/ asthma or knowledge of premature birth.  Sleeping ok without nocturnal  or early am exacerbation  of respiratory  c/o's or need for noct saba. Also denies any obvious fluctuation of symptoms with weather or environmental changes or other aggravating or alleviating factors except as outlined above    Current Medications, Allergies, Complete Past Medical History, Past Surgical History, Family History, and Social History were reviewed in [employer] record.  ROS  The following are not active complaints unless bolded sore throat, dysphagia, dental problems, itching, sneezing,  nasal congestion or excess/ purulent secretions, ear ache,   fever, chills, sweats, unintended wt loss, pleuritic or exertional cp, hemoptysis,  orthopnea pnd or leg swelling, presyncope, palpitations, heartburn, abdominal pain, anorexia, nausea, vomiting, diarrhea  or change in bowel or urinary habits, change in stools or urine, dysuria,hematuria,  rash, arthralgias, visual complaints, headache, numbness weakness or ataxia or problems with walking or coordination,  change in mood/affect or memory.           PMH:  -Hx of NSTEMI.07/2009 w/  cardiac cath s/p 2 stents in LAD/ RCA       >>Echo at that time showed Mild LVH, EF of 55% and grade 1 diastolic dysfuntion  -Hyperlipidemia -HTN  -OA -s/p bilat. Knee replacements - Obesity    SH:  Retired from [employer] work Former smoker -quit 1984 -smoked x 20y x 1 PPD  Married  No pets  No unusual hobbies  [place] on vacation frequently (1-10/2012 -traveled to [place], [place] and [place] )           Objective:   Physical Exam GEN: A/Ox3; pleasant , NAD, obese WF  / amb nad  cushingnoid   01/31/2013   237  > 07/03/2013  232 > 07/24/2013  231 > 09/06/2013  233 > 04/18/2014 256  10/18/2013  233 > 11/29/2013 238 >  03/07/2014  249 > 05/17/2014  263 > 06/26/2014 261>   10/01/2014  247 > 01/02/2015  253 >  04/16/2015 247 >  04/30/2015  244 > 06/14/2015   246 > 09/23/2015   255   Vital signs reviewed   HEENT:  [place]/AT,  EACs-clear, TMs-wnl, NOSE-clear, THROAT-clear, no lesions, no postnasal drip or exudate noted.    Edentulous   NECK:  Supple w/ fair ROM; no JVD; normal carotid impulses w/o bruits; no thyromegaly or nodules palpated; no lymphadenopathy.  RESP    slt  decreased bs bilaterally, no insp crackles or exp rhonchi/ mild pseudowheeze   CARD:  RRR, no m/r/g -  Min lower ext  edema, pulses intact, no cyanosis or clubbing., neg homans sign   GI:   Soft & nt; nml bowel sounds; no organomegaly or masses detected.  Musco: Warm bil, no deformities or joint swelling noted.  Bilateral knee scars                 Assessment & Plan:

## 2015-09-23 NOTE — Assessment & Plan Note (Signed)
Body mass index is 41.61 kg/(m^2).  Lab Results  Component Value Date   TSH 0.62 05/08/2014     Contributing to gerd tendency/ doe/reviewed the need and the process to achieve and maintain neg calorie balance > defer f/u primary care including intermittently monitoring thyroid status

## 2015-09-23 NOTE — Assessment & Plan Note (Signed)
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 > discussed with IR, rec FOB > done 07/05/2013 > nl airways > neg afb/fungus/path  - IR Bx 07/11/2013 > inflammatory, special stains pending but does not look like tb or fungus and responds short course pred per pt > rec prednisone x 6 days only and needs VATS bx but declines as of 07/13/2013 > rec'd final path from [facility] review > c/w BOOP though can't exclude asp/infection sources - 07/19/2013 notified pt and will return 07/24/13 for cxr/ esr then consider steroid rx (needs crypto serology also) - 07/24/2013 ESR 42 off pred x 3 weeks - 07/24/13 > crypto serology neg/ Anti-CCP neg  - started maint   prednisone 09/06/2013  >>> cxr cleared 10/18/2013 on 20 mg per day > rec taper to 20/10 - 11/28/13 reduced to 10 mg daily  - 03/07/2014 5 mg x 2 weeks then 5 mg qod x 2 weeks and stopped pred  04/10/14  - ESR 04/16/2015  = 24 / recurrent nodules > rec pred 20 mg daily > nodules resolved on f/u cxr 06/14/2015  - 04/30/2015 rec ceiling of 20 and floor of 10/5   No def evidence of boop flare though her cough is impressive (see separate a/p)   The goal with a chronic steroid dependent illness is always arriving at the lowest effective dose that controls the disease/symptoms and not accepting a set "formula" which is based on statistics or guidelines that don't always take into account patient  variability or the natural hx of the dz in every individual patient, which may well vary over time.  For now therefore I recommend the patient maintain  Ceiling of 20 and floor of 10/5 as before   I had an extended discussion with the patient reviewing all relevant studies completed to date and  lasting 15 to 20 minutes of a 25 minute visit    Each maintenance medication was reviewed in detail including most importantly the difference between maintenance and prns and under what  circumstances the prns are to be triggered using an action plan format that is not reflected in the computer generated alphabetically organized AVS.    Please see instructions for details which were reviewed in writing and the patient given a copy highlighting the part that I personally wrote and discussed at today's ov.

## 2015-10-10 ENCOUNTER — Ambulatory Visit (INDEPENDENT_AMBULATORY_CARE_PROVIDER_SITE_OTHER): Payer: Medicare Other | Admitting: *Deleted

## 2015-10-10 DIAGNOSIS — E538 Deficiency of other specified B group vitamins: Secondary | ICD-10-CM

## 2015-10-10 MED ORDER — CYANOCOBALAMIN 1000 MCG/ML IJ SOLN
1000.0000 ug | Freq: Once | INTRAMUSCULAR | Status: AC
  Administered 2015-10-10: 1000 ug via INTRAMUSCULAR

## 2015-10-21 ENCOUNTER — Telehealth: Payer: Self-pay

## 2015-10-21 NOTE — Telephone Encounter (Signed)
I will see her then  

## 2015-10-21 NOTE — Telephone Encounter (Signed)
Pt has appt 10/23/15 at 10:15 with Dr Glori Bickers.

## 2015-10-21 NOTE — Telephone Encounter (Signed)
PLEASE NOTE: All timestamps contained within this report are represented as [place] Standard Time. CONFIDENTIALTY NOTICE: This fax transmission is intended only for the addressee. It contains information that is legally privileged, confidential or otherwise protected from use or disclosure. If you are not the intended recipient, you are strictly prohibited from reviewing, disclosing, copying using or disseminating any of this information or taking any action in reliance on or regarding this information. If you have received this fax in error, please notify us immediately by telephone so that we can arrange for its return to [place]. Phone: [phone], Toll-Free: [phone], Fax: [phone] Page: 1 of 2 Call Id: [id number] Elizabeth Patient Name: Kelli Velasquez Gender: Female DOB: Jul 24, 1941 Age: 75 Y 64 M 8 D Return Phone Number: [phone] (Primary) Address: City/State/Zip: [place] Client [place] [facility] Client Client Site [place], [place] Type Call Who Is Calling Patient / Member / Family / Caregiver Call Type Triage / Clinical Caller Name Sailor Kallenberger Relationship To Patient Self Return Phone Number [phone] (Primary) Chief Complaint BREATHING - shortness of breath or sounds breathless Reason for Call Symptomatic / Request for [place] stated she is having shortness of breath and she believes it is due to her asthma. Appointment Disposition EMR Appointment Attempted - Not Scheduled Info pasted into Epic No PreDisposition Call Doctor Translation No Nurse Assessment Nurse: Denyse Amass, RN, Benjamine Mola Date/Time (Eastern Time): 10/21/2015 4:05:23 PM Confirm and document reason for call. If symptomatic, describe symptoms. You must click the next button to save text entered. ---Patient  states she has been short of breath for a couple of days, the shortness of breath occurs when she increases her activity and decreases when she is at rest. Has the patient traveled out of the country within the last 30 days? ---Not Applicable Does the patient have any new or worsening symptoms? ---Yes Will a triage be completed? ---Yes Related visit to physician within the last 2 weeks? ---No Does the PT have any chronic conditions? (i.e. diabetes, asthma, etc.) ---Yes List chronic conditions. ---Asthma HTN High Cholesterol GERD Is this a behavioral health or substance abuse call? ---No Guidelines Guideline Title Affirmed Question Affirmed Notes Nurse Date/Time (Eastern Time) Breathing Difficulty [1] MODERATE longstanding difficulty breathing (e.g., speaks in phrases, SOB even at rest, [place], RN, Benjamine Mola 10/21/2015 4:13:01 PM PLEASE NOTE: All timestamps contained within this report are represented as [place] Standard Time. CONFIDENTIALTY NOTICE: This fax transmission is intended only for the addressee. It contains information that is legally privileged, confidential or otherwise protected from use or disclosure. If you are not the intended recipient, you are strictly prohibited from reviewing, disclosing, copying using or disseminating any of this information or taking any action in reliance on or regarding this information. If you have received this fax in error, please notify us immediately by telephone so that we can arrange for its return to [place]. Phone: [phone], Toll-Free: [phone], Fax: [phone] Page: 2 of 2 Call Id: [id number] Guidelines Guideline Title Affirmed Question Affirmed Notes Nurse Date/Time Eilene Ghazi Time) pulse 100-120) AND [2] SAME as normal Disp. Time Eilene Ghazi Time) Disposition Final User 10/21/2015 4:04:01 PM Send to Urgent Queue Hermine Messick 10/21/2015 4:14:06 PM See PCP When Office is Open (within 3 days) Yes [place], RN,  Lorin Glass Understands: Yes Disagree/Comply: Comply Care Advice Given Per Guideline SEE PCP WITHIN 3 DAYS: *  You need to be seen within 2 or 3 days. Call your doctor during regular office hours and make an appointment. An urgent care center is often the best source of care if your doctor's office is closed or you can't get an appointment. NOTE: If office will be open tomorrow, tell caller to call then, not in 3 days. * IF PATIENT HAS NO PCP: An urgent care center is often the best source of care if you do not have a regular doctor you see in the next couple days. NOTE: Try to help caller find a doctor. Is there a physician referral line or other resource? Having a PCP or 'medical home' means better long-term care. GENERAL CARE ADVICE FOR BREATHING DIFFICULTY: * Find position of greatest comfort. For most patients the best position is semi-upright (e.g., sitting up in a comfortable chair or lying back against pillows). * Elevate head of bed (e.g., use pillows or place blocks under bed). * Avoid smoke or fume exposure. * Create a draft (e.g., use a fan directed at the face, or open a window). * Keep room temperature slightly on the cool side. * Limit activities or space activities apart during the day. Prioritize activities. * Use a humidifier. CALL BACK IF: * Severe difficulty breathing occurs * Fever more than 100.5 F (38.1 C) * You become worse

## 2015-10-23 ENCOUNTER — Encounter: Payer: Self-pay | Admitting: Family Medicine

## 2015-10-23 ENCOUNTER — Ambulatory Visit (INDEPENDENT_AMBULATORY_CARE_PROVIDER_SITE_OTHER): Payer: Medicare Other | Admitting: Family Medicine

## 2015-10-23 VITALS — BP 130/74 | HR 96 | Temp 97.7°F | Ht 65.5 in | Wt 249.0 lb

## 2015-10-23 DIAGNOSIS — J309 Allergic rhinitis, unspecified: Secondary | ICD-10-CM

## 2015-10-23 MED ORDER — MONTELUKAST SODIUM 10 MG PO TABS: 10.0000 mg | ORAL_TABLET | Freq: Every day | ORAL | Status: DC

## 2015-10-23 MED ORDER — ZETIA 10 MG PO TABS: ORAL_TABLET | ORAL | Status: DC

## 2015-10-23 NOTE — Progress Notes (Signed)
Subjective:    Patient ID: Kelli Velasquez, female    DOB: 10-Mar-1941, 75 y.o.   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 Asthma vs upper airway cough symdrome  Was on Qvar and it worked better than flovent  Ins co does not cover Qvar - cannot afford it out of pocket   Patient Active Problem List   Diagnosis Date Noted  . Supraumbilical hernia 123456  . Lump in the abdomen 04/10/2015  . GERD (gastroesophageal reflux disease) 10/10/2014  . B12 deficiency 10/09/2014  . Skin lesions 10/09/2014  . Cataracts, bilateral 10/09/2014  . Dry eyes 10/09/2014  . Fatigue 05/08/2014  . Pedal edema 05/08/2014  . Shoulder tendonitis 01/24/2014  . Osteopenia 11/28/2013  . Estrogen deficiency 11/02/2013  . Dyspnea 06/27/2013  . Hernia, incisional 04/03/2013  . 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 Cough 12/18/2012  . Chronic respiratory failure with hypoxia and hypercapnia ([place]) 10/26/2012  . Special screening for malignant neoplasms, colon 06/16/2011  . DYSPHONIA 10/10/2010  . HYPERGLYCEMIA 05/27/2010  . CAD, NATIVE VESSEL 08/19/2009  . 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 Severe obesity (BMI >= 40) ([place]) 09/01/2007  . MYOPIA 03/03/2007  . Hyperlipidemia 10/15/2006  . Essential hypertension 10/15/2006  . ALLERGIC  RHINITIS 10/15/2006  . Cough variant asthma vs UACS  10/15/2006  . OVERACTIVE BLADDER 10/15/2006  . OSTEOARTHRITIS 10/15/2006  . Urinary incontinence 10/15/2006   Past Medical History  Diagnosis Date  . Myocardial infarction ([place])     subendocardial, initial episode  . Hyperlipidemia   . Hypertension   . Obesity   . Neoplasm of skin     neoplasm of uncertain behavior of skin  . Vertigo   . Myopia   . Overactive bladder   . Urinary incontinence   . Osteoarthritis   . Asthma   . Allergy     allergic rhinitis  . Rash     and other non specific skin eruptions  . Gallstones   . Coronary artery disease     cath January 2011 with DEs LAD and RCA  . GERD (gastroesophageal reflux disease)   . Nocturnal oxygen desaturation     o2 at night  . Wears glasses   . Full dentures    Past Surgical History  Procedure Laterality Date  . Total knee arthroplasty  2010    left , then vocal cord infection post op  . Vocal cord polypectomy    . Cholecystectomy  89  . Joint replacement  2007    right total knee replacement  . Dilation and curettage of uterus  1984  . Colonoscopy    . Coronary angioplasty with stent placement  07/2009  stent  . Incisional hernia repair N/A 05/16/2013    Procedure: HERNIA REPAIR INFRAUMILICAL INCISIONAL;  Surgeon: Joyice Faster. Cornett, MD;  Location: [facility];  Service: General;  Laterality: N/A;  umbilical  . Insertion of mesh N/A 05/16/2013    Procedure: INSERTION OF MESH;  Surgeon: Joyice Faster. Cornett, MD;  Location: [facility];  Service: General;  Laterality: N/A;  umbilical  . Video bronchoscopy Bilateral 07/05/2013    Procedure: VIDEO BRONCHOSCOPY WITH FLUORO;  Surgeon: Tanda Rockers, MD;  Location: [facility];  Service: Cardiopulmonary;  Laterality: Bilateral;   Social History  Substance Use Topics  . Smoking status: Former Smoker -- 1.00 packs/day for 25 years    Types: Cigarettes    Quit date: 07/27/1982  .  Smokeless tobacco: Never Used  . Alcohol Use: 0.0 oz/week    0 Standard drinks or equivalent per week     Comment: wine-rare   Family History  Problem Relation Age of Onset  . Stroke Mother   . Stroke Father   . Colon cancer Neg Hx   . Breast cancer Maternal Grandmother   . Diabetes Mother 22   Allergies  Allergen Reactions  . Shellfish Allergy Anaphylaxis  . Aspirin     REACTION: nausea and vomiting High doses  . Atorvastatin     REACTION: leg pain  . Crestor [Rosuvastatin Calcium] Other (See Comments)    Muscle pain - allergy/intolerance  . Fish Allergy   . Penicillins     Due to mold allergy per pt  . Simvastatin     REACTION: muscle pain  . Trandolapril     REACTION: leg pain   Current Outpatient Prescriptions on File Prior to Visit  Medication Sig Dispense Refill  . amLODipine (NORVASC) 10 MG tablet Take 1 tablet (10 mg total) by mouth daily. 90 tablet 1  . Ascorbic Acid (VITAMIN C) 1000 MG tablet Take 1,000 mg by mouth daily.      Marland Kitchen aspirin 81 MG tablet Take 81 mg by mouth daily.    . calcium-vitamin D (OSCAL WITH D 500-200) 500-200 MG-UNIT per tablet Take 1 tablet by mouth daily.      . cromolyn (NASALCROM) 5.2 MG/ACT nasal spray Place 2 sprays into the nose daily.     . Cyanocobalamin (B-12 PO) Take 1 capsule by mouth daily.    Marland Kitchen EPIPEN 2-PAK 0.3 MG/0.3ML SOAJ injection INJECT 0.3 MLS (0.3 MG TOTAL) INTO THE MUSCLE ONCE AS NEEDED FOR ALLERGIC REACTION 2 Device 0  . esomeprazole (NEXIUM) 40 MG capsule Take 1 capsule (40 mg total) by mouth daily. 30 capsule 11  . ezetimibe (ZETIA) 10 MG tablet TAKE 1 TABLET (10 MG TOTAL) BY MOUTH DAILY. 30 tablet 5  . fluticasone (FLOVENT HFA) 110 MCG/ACT inhaler Inhale 2 puffs into the lungs 2 (two) times daily. 1 Inhaler 12  . furosemide (LASIX) 20 MG tablet TAKE 1 TABLET BY MOUTH EVERY DAY AS NEEDED 30 tablet 5  . guaiFENesin-codeine (ROBITUSSIN AC) 100-10 MG/5ML syrup Take 5 mLs by mouth 3 (three) times daily as needed for cough.     . nitroGLYCERIN (NITROSTAT) 0.4 MG SL tablet Place 1 tablet (0.4 mg total) under the tongue every 5 (five) minutes as needed for chest pain. 25 tablet 6  . OXYGEN Inhale into the lungs. 2 liters QHS    . predniSONE (DELTASONE) 10 MG tablet 20 mg daily 100 tablet 2  . PROAIR HFA 108 (90 BASE) MCG/ACT inhaler INHALE 2 PUFFS  BY MOUTH EVERY 4 HOURS AS NEEDED FOR WHEEZING OR FOR SHORTNESS OF BREATH 8 Inhaler 5  . ranitidine (ZANTAC) 150 MG tablet Take one pill by mouth twice daily    . Respiratory Therapy Supplies (FLUTTER) DEVI Use as directed 1 each 0  . UNABLE TO FIND Med Name: b-12 injections per Dr. Glori Bickers     No current facility-administered medications on file prior to visit.     Review of Systems Review of Systems  Constitutional: Negative for fever, appetite change, fatigue and unexpected weight change.  ENT pos for cong and rhinorrhea and throat clearing , neg for sinus pain or purulent nasal drainage  Eyes: Negative for pain and visual disturbance.  Respiratory: Negative for cough and shortness of breath.  pos for wheezing  Cardiovascular: Negative for cp or palpitations    Gastrointestinal: Negative for nausea, diarrhea and constipation.  Genitourinary: Negative for urgency and frequency.  Skin: Negative for pallor or rash   Neurological: Negative for weakness, light-headedness, numbness and headaches.  Hematological: Negative for adenopathy. Does not bruise/bleed easily.  Psychiatric/Behavioral: Negative for dysphoric mood. The patient is not nervous/anxious.         Objective:   Physical Exam  Constitutional: She appears well-developed and well-nourished. No distress.  obese and well appearing   HENT:  Head: Normocephalic and atraumatic.  Right Ear: External ear normal.  Left Ear: External ear normal.  Mouth/Throat: Oropharynx is clear and moist.  Nares are boggy and injected  Clear rhinorrhea and post nasal drip  No sinus tenderness  Clears throat frequently     Eyes: Conjunctivae and EOM are normal. Pupils are equal, round, and reactive to light. Right eye exhibits no discharge. Left eye exhibits no discharge.  Neck: Normal range of motion. Neck supple.  Cardiovascular: Normal rate and regular rhythm.   Pulmonary/Chest: Effort normal. No respiratory distress. She has wheezes. She has no rales. She exhibits no tenderness.  Scant wheeze on forced exp only No crackles Good air exch  Musculoskeletal: She exhibits no edema.  Lymphadenopathy:    She has no cervical adenopathy.  Skin: Skin is warm and dry. No rash noted. No erythema. No pallor.  Psychiatric: She has a normal mood and affect.          Assessment & Plan:   Problem List Items Addressed This Visit      Respiratory   Allergic rhinitis - Primary    With pnd and throat clearing (already treated for GERD) Breathing is also affected (wheezing) -though she feels like BOOP is well controlled at this time  Will try adding singulair for both allergies and asthma symptoms Also trial of nasal steroid spray for her rhinitis  Also adv to get rid of the scents/scented oils in her home that may exacerbate her symptoms  Allergen avoidance discussed   Update if not starting to improve in the next several weeks  or if worsening

## 2015-10-23 NOTE — Patient Instructions (Signed)
Try singulair (generic) for allergies (post nasal drip) and asthma symptoms  You can also try flonase or nasacort as directed over the counter for nasal symptoms as well  Next time you see Dr Hector Brunswick the possibility of sleep apnea  Update if not starting to improve in 1-2 weeks  or if worsening    Stop diffusing oil /scents in the home and avoid perfumes also

## 2015-10-23 NOTE — Progress Notes (Signed)
Pre visit review using our clinic review tool, if applicable. No additional management support is needed unless otherwise documented below in the visit note. 

## 2015-10-24 NOTE — Assessment & Plan Note (Signed)
With pnd and throat clearing (already treated for GERD) Breathing is also affected (wheezing) -though she feels like BOOP is well controlled at this time  Will try adding singulair for both allergies and asthma symptoms Also trial of nasal steroid spray for her rhinitis  Also adv to get rid of the scents/scented oils in her home that may exacerbate her symptoms  Allergen avoidance discussed   Update if not starting to improve in the next several weeks  or if worsening

## 2015-10-25 DIAGNOSIS — N393 Stress incontinence (female) (male): Secondary | ICD-10-CM | POA: Diagnosis not present

## 2015-10-25 DIAGNOSIS — N3281 Overactive bladder: Secondary | ICD-10-CM | POA: Diagnosis not present

## 2015-10-25 DIAGNOSIS — Z0000 Encounter for general adult medical examination without abnormal findings: Secondary | ICD-10-CM | POA: Diagnosis not present

## 2015-11-04 ENCOUNTER — Encounter: Payer: Self-pay | Admitting: Internal Medicine

## 2015-11-04 ENCOUNTER — Other Ambulatory Visit (INDEPENDENT_AMBULATORY_CARE_PROVIDER_SITE_OTHER): Payer: Medicare Other

## 2015-11-04 ENCOUNTER — Ambulatory Visit (INDEPENDENT_AMBULATORY_CARE_PROVIDER_SITE_OTHER): Payer: Medicare Other | Admitting: Internal Medicine

## 2015-11-04 ENCOUNTER — Telehealth: Payer: Self-pay

## 2015-11-04 ENCOUNTER — Ambulatory Visit (INDEPENDENT_AMBULATORY_CARE_PROVIDER_SITE_OTHER)
Admission: RE | Admit: 2015-11-04 | Discharge: 2015-11-04 | Disposition: A | Discharge status: Home / Self Care | Payer: Medicare Other | Source: Ambulatory Visit | Attending: Internal Medicine | Admitting: Internal Medicine

## 2015-11-04 VITALS — BP 134/78 | HR 92 | Ht 65.5 in | Wt 253.0 lb

## 2015-11-04 DIAGNOSIS — R918 Other nonspecific abnormal finding of lung field: Secondary | ICD-10-CM

## 2015-11-04 DIAGNOSIS — J45991 Cough variant asthma: Secondary | ICD-10-CM | POA: Diagnosis not present

## 2015-11-04 DIAGNOSIS — J9612 Chronic respiratory failure with hypercapnia: Secondary | ICD-10-CM | POA: Diagnosis not present

## 2015-11-04 DIAGNOSIS — J9611 Chronic respiratory failure with hypoxia: Secondary | ICD-10-CM

## 2015-11-04 DIAGNOSIS — I517 Cardiomegaly: Secondary | ICD-10-CM | POA: Diagnosis not present

## 2015-11-04 LAB — CBC WITH DIFFERENTIAL/PLATELET
Basophils Absolute: 0 10*3/uL (ref 0.0–0.1)
Basophils Relative: 0.2 % (ref 0.0–3.0)
EOS PCT: 1.2 % (ref 0.0–5.0)
Eosinophils Absolute: 0.2 10*3/uL (ref 0.0–0.7)
HCT: 44.3 % (ref 36.0–46.0)
HEMOGLOBIN: 14.9 g/dL (ref 12.0–15.0)
Lymphs Abs: 1.6 10*3/uL (ref 0.7–4.0)
MCHC: 33.7 g/dL (ref 30.0–36.0)
MCV: 92.6 fl (ref 78.0–100.0)
MONOS PCT: 4.3 % (ref 3.0–12.0)
Monocytes Absolute: 0.9 10*3/uL (ref 0.1–1.0)
Neutro Abs: 17.8 10*3/uL — ABNORMAL HIGH (ref 1.4–7.7)
Neutrophils Relative %: 86.5 % — ABNORMAL HIGH (ref 43.0–77.0)
Platelets: 303 10*3/uL (ref 150.0–400.0)
RBC: 4.79 Mil/uL (ref 3.87–5.11)
RDW: 15.7 % — ABNORMAL HIGH (ref 11.5–15.5)

## 2015-11-04 LAB — SEDIMENTATION RATE: Sed Rate: 23 mm/hr — ABNORMAL HIGH (ref 0–22)

## 2015-11-04 NOTE — Telephone Encounter (Signed)
Lab call for call report on pt's WBC count at 20.6.  Informed Dr Melvyn Novas.   Routing for comment/review.

## 2015-11-04 NOTE — Assessment & Plan Note (Signed)
11/24/2012 Office walk showed desats 86% on RA  With  spirometry FEV1 nml-73% , ratio 80   11/25/2012 CTA Chest >>neg for PE/ ILD  12/07/12 ONO > Pos 5: 81min  begin O2 2 l/m At bedtime  12/13/2012  >  Repeat 02/01/13 on 2lpm 50min> no change rx  - 01/31/2013  Walked RA x 3 laps @ 185 ft each stopped due to  End of study, no desats  - 07/03/2013 sats 75% RA >  On 3lpm sats 93%  > rx with 24 h 02 at 3lpm   - improved by self monitoring as of 07/24/13 ov so changed rx to 2lpm/24/7 humidified due to nasal dryness - 09/06/2013  Walked 2lpm x  2 laps @ 185 ft each stopped due to legs tired, no desats    - 04/18/2014  Walked RA  @ mod pace x 2 laps @ 185 ft each stopped due to  Hip pain, slow pace, sats 89% at end  - 05/16/2014  [facility] RA  2 laps @ 185 ft each stopped due to sob/fatigue but no desats off pred x 04/10/14  - 04/16/2015  Walked RA x 3 laps @ 185 ft each stopped due to   - ONO RA   10/05/2014 >  02 sat < 89% 213 min so rec resume 2lpm and work on wt loss  - 04/10/15  HCO3 36  - 04/16/2015  Walked RA x 3 laps @ 185 ft each stopped due to  91% End of study, nl pace, no sob or desat    As of 11/04/2015  02 2lpm at hs only

## 2015-11-04 NOTE — Progress Notes (Signed)
Quick Note:  Spoke with pt and notified of results per Dr. Wert. Pt verbalized understanding and denied any questions.  ______ 

## 2015-11-04 NOTE — Progress Notes (Signed)
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 Follow-up    Pt had PET scan done this am. Her cough and SOB have improved since the last visit.   saba inhaler helps some/ min mucoid sputum, temp to 99 but no rigors, no purulent sputum or hemoptysis/ does have some wt. Loss, no sweating.  rec Dulera 100 Take 2 puffs first thing in am and then another 2 puffs about 12 hours later.  Prednisone 10 mg take  4 each am x 2 days,   2 each am x 2 days,  1 each am x 2 days and stop Only use your albuterol as a rescue medication  We will arrange 24/7 02 at 3lpm per advanced  We will call to arrange a biopsy of the easiest area once I discuss this with radiology  Late add: Discussed in detail all the  indications, usual  risks and alternatives  relative to the benefits with patient who agrees to proceed with bronchoscopy with biopsy.   FOB > done 07/05/2013 > nl airways > neg afb/fungus/path  - IR Bx 07/11/2013 > inflammatory, special stains pending but does not look like tb or fungus and responds short course pred per pt > rec prednisone x 6 days only and needs VATS bx but declines as of 07/13/2013 > rec'd final path from [facility] review > c/w BOOP though can't exclude asp/infection sources     07/24/2013 f/u ov/Wert re: ? Nodular BOOP Chief Complaint  Patient presents with  . Followup with CXR    Pt states that her breathing is overall doing well. She states that she is able to more activity and feels stronger.   02 2lpm when ambulatory sats in mid  90's last dose of prednisone sev weeks ago really helped sob and no worse since stopped.  No h/o arthralgias, rash, pet exposure. rec If condition worsens take pred x 6 days trial    09/06/2013 f/u ov/Wert re:  02 24/7 since Dec 2014  Chief Complaint  Patient presents with  . Follow-up    Pt states that her breathing is doing well and her cough has pretty much resolved. No new co's today. Has not had to use rescue inhaler.   Finished pred Aug 11 2013 and markedly better then a little worse since few days prior to OV   = more cough white mucus, no fever. rec Please remember to go to the lab and x-ray department downstairs for your tests - we will call you with the results when they are available. Prednisone 20 mg per day until your best then reduce to 10 mg per day x one week then one half daily as tolerated, increase back up if needed. Not really clear how much asthma present > ok to change dulera to prn      03/07/2014 f/u ov/Wert re: nodular boop / pred 10 mg daily  Chief Complaint  Patient presents with  . Follow-up    Pt states  doing well and denies any co's today.   Not limited by breathing from desired activities  Except by the humdity   rec Taper pred off by around 04/09/14    04/18/2014 f/u ov/Wert re: nodular boop x 2 weeks off pred Chief Complaint  Patient presents with  . Follow-up    Pt stopped pred x 2 wks ago- since then c/o fatigue.    no cough, no nausea/fever/ sweats. Fatigue was bad the first week off pred and better the next  rec No change rx - leave off  pred   06/26/2014 f/u ov/Wert re: f/u boop  Off pred since 04/09/14  Chief Complaint  Patient presents with  . Follow-up    productive cough white   does not think she coughed much while on prednisone but back to baseline chronic cough once stopped it   Albuterol helps some  Cough flares with rain any time of year  Tends to occur also at hs despite two h1  At hs  and pepcid at hs but total amt of mucus is  very small  rec qvar 80 Take 2 puffs first thing in am and then another 2 puffs about 12 hours later and if improve on this ok to reduce to one twice daily  Work on inhaler technique:  Ok to use the cough medicine and albuterol as needed  Would avoid fosfamax for now with alternative being reclast one injection yearly    10/01/2014 f/u ov/Wert re: boop Off pred since 04/09/14 / asthma  on qvar 80 2 bid and very rare saba Chief Complaint  Patient presents with  . Follow-up    CXR done today. Pt states her breathing is doing well overall with minimal cough.    rec Try qvar 80 one twice daily  Please see patient coordinator before you leave today  to schedule overnight oximetry to see if ok to stop your 02 > still required it   01/02/2015 f/u ov/Wert re: s/p boop/ cough variant asthma qva 80 one twice daily  Chief Complaint  Patient presents with  . Follow-up    Pt states breathing has improved. Pt c/o occasional prod cough with white mucus.    rec Ok to take qvar up to 2 every 12 hours or down to zero taper no more frequently than every 2 weeks  Keep working on the weight F/u prn    04/16/2015 acute extended  ov/Wert re: worse  Sob but cough gone when back on qvar 80 2bid  Chief Complaint  Patient presents with  . Acute Visit    Pt c/o increased SOB x 2 wks. She had CT abd pelvis done 9/15 and was advised had MPN.    0nset was insidious, pattern is gradual=  Doe with more than slow adls/ none at rest. rec Please remember to go to the lab and x-ray department downstairs for your tests - we will call you with the results when they are available. Continue qvar 80 Take 2 puffs first thing in am and then another 2 puffs about 12 hours later.  Only use your albuterol as a rescue medication    04/30/2015  f/u ov/Wert re: nodular boop back on prednisone since 04/16/15  With symptoms starting later part of August 2016  Chief Complaint  Patient presents with  . Follow-up  rec Prednisone  ceiling is 20 mg daily and floor is 10 mg alternating with 5 mg daily but don't go below this dose  If you  need a biphosphonate, I recommend yearly IV reclast  Or Prolia but defer this call to Dr Glori Bickers    06/14/2015  f/u ov/Wert re: BOOP on prednisone 20 mg daily and qvar 80 one bid  Chief Complaint  Patient presents with  . Follow-up    Pt following for pulmonary nodules c/w nodular boop with CXR today:  pt states she is doing well. pt states breathing is good most of the time, sometimes she has a SOB. no c/o coughing,wheezing or chest tightness. pt sttaes 2 days after being on the 10 mg of prednisone she experiences breathing problems   rec Ok to wean prednisone to the lowest dose that works but not below 10 mg alternating with 5 mg (your floor) and not above 20 mg daily (your ceiling)     09/23/2015  f/u ov/Wert re: steroid dep boop? bronchopna in [place] Complaint  Patient presents with  . Follow-up    Pt c/o for the past 2 wks. Cough is non prod. She has had 2 rounds of abx. She is using albuterol 3 x daily on average.    onset 9th of feb 2017 while on  10/5 pred  and flovent110  2 bid then ? Caught cold from fm member first zpak then levaquin finished one day prior to OV and has maintained on pred 20 mg since but persistent harsh honking quality upper airway coughing ongoing/ codeine dependent/ mucus is all white now  Confused with how to use ppi and h2 rec When coughing take zantac after supper and bedtime and when not just take it at  Bedtime x 150  Cough into the flutter valve to prevent airway trauma Reduce the prednisone to 20/10 x one week, then 10 x one week then 10/5 until return 6 weeks       11/04/2015  f/u ov/Wert re: BOOP/ cough variant asthma  10/5  Plus flovent 110/ singulair plus nexium ac and zantact hs Chief Complaint  Patient presents with  . Follow-up    Breathing is overall doing well. She has occ wheeze in the am. She is using rescue inhaler 2 x per  wk on average.   cough/ rhinitis seasonal pattern much better on singulair per Dr Glori Bickers x 2 weeks   No  sob with adl's but not pushing herself much at this point  - waiting for the pool to open, limited by L hip   No obvious day to day or daytime variabilty or assoc    cp or chest tightness, subjective wheeze overt sinus or hb symptoms. No unusual exp hx or h/o childhood pna/ asthma or knowledge of premature birth.  Sleeping ok without nocturnal  or early am exacerbation  of respiratory  c/o's or need for noct saba. Also denies any obvious fluctuation of symptoms with weather or environmental changes or other aggravating or alleviating factors except as outlined above   Current Medications, Allergies, Complete Past Medical History, Past Surgical History, Family History, and Social History were reviewed in [employer] record.  ROS  The following are not active complaints unless bolded sore throat, dysphagia, dental problems, itching, sneezing,  nasal congestion or excess/ purulent secretions, ear ache,   fever, chills, sweats, unintended wt loss, pleuritic or exertional cp, hemoptysis,  orthopnea pnd or leg swelling, presyncope, palpitations, heartburn, abdominal pain, anorexia, nausea, vomiting, diarrhea  or change in bowel or urinary habits, change in stools or urine, dysuria,hematuria,  rash, arthralgias, visual complaints, headache, numbness weakness  or ataxia or problems with walking or coordination,  change in mood/affect or memory.           PMH:  -Hx of NSTEMI.07/2009 w/  cardiac cath s/p 2 stents in LAD/ RCA       >>Echo at that time showed Mild LVH, EF of 55% and grade 1 diastolic dysfuntion  -Hyperlipidemia -HTN  -OA -s/p bilat. Knee replacements - Obesity    SH:  Retired from [employer] work Former smoker -quit 1984 -smoked x 20y x 1 PPD  Married  No pets  No unusual hobbies  [place] on vacation frequently (1-10/2012 -traveled to [place], [place] and [place] )            Objective:   Physical Exam GEN: A/Ox3; pleasant , NAD, obese WF  / amb nad  cushingnoid   01/31/2013   237  > 07/03/2013  232 > 07/24/2013  231 > 09/06/2013  233 > 04/18/2014 256  10/18/2013  233 > 11/29/2013 238 >  03/07/2014  249 > 05/17/2014  263 > 06/26/2014 261>   10/01/2014  247 > 01/02/2015  253 >  04/16/2015 247 >  04/30/2015  244 > 06/14/2015   246 > 09/23/2015   255 > 11/04/2015 253   Vital signs reviewed   HEENT:  Dent/AT,  EACs-clear, TMs-wnl, NOSE-clear, THROAT-clear, no lesions, no postnasal drip or exudate noted.    Edentulous   NECK:  Supple w/ fair ROM; no JVD; normal carotid impulses w/o bruits; no thyromegaly or nodules palpated; no lymphadenopathy.  RESP    slt decreased bs bilaterally, no insp crackles or exp rhonchi/ mild pseudowheeze   CARD:  RRR, no m/r/g -  Min lower ext  edema, pulses intact, no cyanosis or clubbing., neg homans sign   GI:   Soft & nt; nml bowel sounds; no organomegaly or masses detected.  Musco: Warm bil, no deformities or joint swelling noted.  Bilateral knee scars     CXR PA and Lateral:   11/04/2015 :    I personally reviewed images and agree with radiology impression as follows:    1. Cardiomegaly with mild pulmonary interstitial prominence. Findings suggest mild congestive heart failure. Mild infiltrate left upper lobe cannot be excluded.  2. No evidence of pulmonary nodules noted on today's exam  My impression: no evidence of chf or pna     Lab Results  Component Value Date   ESRSEDRATE 23* 11/04/2015   ESRSEDRATE 24* 04/16/2015   ESRSEDRATE 26* 09/06/2013                    Assessment & Plan:

## 2015-11-04 NOTE — Assessment & Plan Note (Addendum)
-  PFT's 01/31/2013 wnl x low fef25-75 and 12% better fev1 p B2 - 06/26/2014 p extensive coaching HFA effectiveness =    75% . Start qvar 80 2bid  - trial of qvar 80 1 bid 10/01/14 > improved 01/02/15 > flared on low dose qvar 02/2015 > back on qvar 80 2bid rec  04/16/2015 > improved 04/30/2015  - added flutter 09/23/2015 >>>   - Singulair rx 10/23/15 def decrease in albuterol need/ decrease wheeze and improved sob so clearly responding to this plus flovent 110 but rmeains to be seen whether with pred completely weaned off will have flare of airway symptoms despite ICS/ singulair   For now >> All goals of chronic asthma control met including optimal function and elimination of symptoms with minimal need for rescue therapy.  Contingencies discussed in full including contacting this office immediately if not controlling the symptoms using the rule of two's.

## 2015-11-04 NOTE — Patient Instructions (Signed)
Please remember to go to the lab and x-ray department downstairs for your tests - we will call you with the results when they are available.    Try prednisone 5 mg daily x 2 week, then 5 mg every other day x 2 weeks, then pred 5 mg every third day x 2 weeks and stop  At any point if worsen > go back to the previous dose that worked  Please schedule a follow up visit in 3 months but call sooner if needed

## 2015-11-05 NOTE — Assessment & Plan Note (Signed)
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 > discussed with IR, rec FOB > done 07/05/2013 > nl airways > neg afb/fungus/path  - IR Bx 07/11/2013 > inflammatory, special stains pending but does not look like tb or fungus and responds short course pred per pt > rec prednisone x 6 days only and needs VATS bx but declines as of 07/13/2013 > rec'd final path from [facility] review > c/w BOOP though can't exclude asp/infection sources - 07/19/2013 notified pt and will return 07/24/13 for cxr/ esr then consider steroid rx (needs crypto serology also) - 07/24/2013 ESR 42 off pred x 3 weeks - 07/24/13 > crypto serology neg/ Anti-CCP neg  - started maint   prednisone 09/06/2013  >>> cxr cleared 10/18/2013 on 20 mg per day > rec taper to 20/10 - 11/28/13 reduced to 10 mg daily  - 03/07/2014 5 mg x 2 weeks then 5 mg qod x 2 weeks and stopped pred  04/10/14  - ESR 04/16/2015  = 24 / recurrent nodules > rec pred 20 mg daily > nodules resolved on f/u cxr 06/14/2015  - 04/30/2015 rec ceiling of 20 and floor of 10/5  - ESR 11/04/2015 =  23  On pred 10/5, rec taper off x 6 weeks if tol    The goal with a chronic steroid dependent illness is always arriving at the lowest effective dose that controls the disease/symptoms and not accepting a set "formula" which is based on statistics or guidelines that don't always take into account patient  variability or the natural hx of the dz in every individual patient, which may well vary over time.  For now therefore I recommend the patient try again to taper completely off   I had an extended discussion with the patient reviewing all relevant studies completed to date and  lasting 15 to 20 minutes of a 25 minute visit    Each maintenance medication was reviewed in detail including most importantly the difference between maintenance and prns and under what circumstances the prns are to be  triggered using an action plan format that is not reflected in the computer generated alphabetically organized AVS.    Please see instructions for details which were reviewed in writing and the patient given a copy highlighting the part that I personally wrote and discussed at today's ov.

## 2015-11-06 ENCOUNTER — Telehealth: Payer: Self-pay | Admitting: Cardiovascular Disease

## 2015-11-06 ENCOUNTER — Telehealth: Payer: Self-pay | Admitting: Internal Medicine

## 2015-11-06 DIAGNOSIS — R0600 Dyspnea, unspecified: Secondary | ICD-10-CM

## 2015-11-06 NOTE — Telephone Encounter (Signed)
New message  Pt called states that she had a chest Xray wit her pulmonary provider. They suggested that she had mild CHF and req to have Dr. Angelena Form to check the results of that Xray and determine if an OV is needed. Please call back to discuss.

## 2015-11-06 NOTE — Telephone Encounter (Signed)
Spoke with pt. States that she was already given her CXR results. She had a question about the report she read on MyChart. There was mention of mild CHF. Pt was questioning why this wasn't mentioned to her. Advised her that I could not answer that for her. States that she is going to call her Cardiologist about this and see if she needs further testing. Nothing further was needed at this time.

## 2015-11-06 NOTE — Telephone Encounter (Signed)
Spoke with pt and informed her that Dr. Angelena Form is out of the office until next week. Pt stated that it was fine to wait until he is back. She seen the impression on MyChart and was concerned where it mentions CHF so she wants Dr. Camillia Herter input on this. Pt denies any cardiac issues. Went ahead and scheduled pt for her yearly f/u in July and advised that if Dr. Angelena Form wants to see her sooner based on the xray we can always change it. Pt verbalized understanding and was appreciative for call back.

## 2015-11-10 ENCOUNTER — Other Ambulatory Visit: Payer: Self-pay | Admitting: Family Medicine

## 2015-11-10 NOTE — Telephone Encounter (Signed)
I reviewed the xray report. Can we see if she is taking Lasix every day? The chart indicates she is using prn. If not using daily, would use Lasix 20 mg daily for one week and follow weights daily. If she has no dyspnea and weight goes down, would use prn lasix for 2 lb weight gain. Would keep f/u in July unless she has issues. cdm

## 2015-11-11 MED ORDER — FUROSEMIDE 40 MG PO TABS: 40.0000 mg | ORAL_TABLET | Freq: Every day | ORAL | Status: DC

## 2015-11-11 NOTE — Telephone Encounter (Signed)
Spoke with pt. She reports she has been taking lasix daily. Started taking daily a couple of months ago due to swelling and to better control urination schedule. Prior to taking daily she would be up several times a night to urinate. Taking daily she urinates more during the day and is not up as much at night.  Will forward to Dr. Angelena Form

## 2015-11-11 NOTE — Telephone Encounter (Signed)
I would increase lasix to 40 mg daily. BMET one week. cdm

## 2015-11-11 NOTE — Telephone Encounter (Signed)
Spoke with pt and gave her instructions from Dr. Angelena Form. Will send prescription to [facility] [address] in [place]. She will come in for BMP on 11/18/15

## 2015-11-18 ENCOUNTER — Other Ambulatory Visit (INDEPENDENT_AMBULATORY_CARE_PROVIDER_SITE_OTHER): Payer: Medicare Other | Admitting: *Deleted

## 2015-11-18 DIAGNOSIS — R0600 Dyspnea, unspecified: Secondary | ICD-10-CM | POA: Diagnosis not present

## 2015-11-18 LAB — BASIC METABOLIC PANEL
BUN: 15 mg/dL (ref 7–25)
CALCIUM: 9 mg/dL (ref 8.6–10.4)
CO2: 33 mmol/L — ABNORMAL HIGH (ref 20–31)
Chloride: 99 mmol/L (ref 98–110)
Creat: 0.79 mg/dL (ref 0.60–0.93)
Glucose, Bld: 137 mg/dL — ABNORMAL HIGH (ref 65–99)
POTASSIUM: 3.7 mmol/L (ref 3.5–5.3)
SODIUM: 143 mmol/L (ref 135–146)

## 2015-11-18 NOTE — Addendum Note (Signed)
Addended by: Eulis Foster on: 11/18/2015 11:06 AM   Modules accepted: Orders

## 2015-11-18 NOTE — Addendum Note (Signed)
Addended by: Eulis Foster on: 11/18/2015 11:05 AM   Modules accepted: Orders

## 2015-11-28 ENCOUNTER — Telehealth: Payer: Self-pay

## 2015-11-28 DIAGNOSIS — J309 Allergic rhinitis, unspecified: Secondary | ICD-10-CM

## 2015-11-28 DIAGNOSIS — J45991 Cough variant asthma: Secondary | ICD-10-CM

## 2015-11-28 NOTE — Telephone Encounter (Signed)
Pt left v/m; pt last seen 10/23/15; pt thinks she needs to see allergist for allergies and asthma issues; pt request referral.Please advise.

## 2015-11-29 NOTE — Telephone Encounter (Signed)
Referral to allergist Will route to [facility]

## 2015-12-02 ENCOUNTER — Telehealth: Payer: Self-pay | Admitting: Cardiovascular Disease

## 2015-12-02 NOTE — Telephone Encounter (Signed)
Pt was told to call office if she had any side effect  from new dosage of  med a week or so ago -SOB-mild headache-wheezing-coughing-wt gain-blurred vision-ankles and feet swelling-fatigue  pls call [phone]

## 2015-12-02 NOTE — Telephone Encounter (Signed)
Spoke with pt.  Lasix was increased on 11/11/15 to 40 mg daily based on results of chest X-ray done at Pulmonary.  Pt has asthma and lung issues and has been on prednisone since September. Followed by pulmonary.  Almost finished with course of prednisone.  She is seeing a new allergist on Thursday and is off allergy medicines in preparation for this appt.   She reports shortness of breath with activity for last couple of weeks.  Has swelling in ankles which comes and goes. Worse when she is on her feet a lot or when feet are dependent. Wheezing at times.  Had blurry vision today after lunch but this has now improved.  Does not weigh everyday but states weight today is 256 lbs and when last checked on 5/4 she was 251 lbs. States prior to 5/4 she had stayed around 251 lbs. No diet changes. I scheduled pt to see Richardson Dopp, PA on 5/25/17at 11:50.  Will forward to Dr. Angelena Form to see if medications need adjustment prior to this visit.

## 2015-12-03 MED ORDER — POTASSIUM CHLORIDE CRYS ER 20 MEQ PO TBCR: 20.0000 meq | EXTENDED_RELEASE_TABLET | Freq: Every day | ORAL | Status: DC

## 2015-12-03 NOTE — Telephone Encounter (Signed)
I spoke with pt and gave her instructions from Dr. Angelena Form. [facility] [place].

## 2015-12-03 NOTE — Telephone Encounter (Signed)
I would have her take an extra 40 mg Lasix (40 mg BID) for next three days. Call in [place] 20 meq daily while taking lasix. Call back if weight does not come down. Avoid salty foods etc. cdm

## 2015-12-05 DIAGNOSIS — J4530 Mild persistent asthma, uncomplicated: Secondary | ICD-10-CM | POA: Diagnosis not present

## 2015-12-05 DIAGNOSIS — H1045 Other chronic allergic conjunctivitis: Secondary | ICD-10-CM | POA: Diagnosis not present

## 2015-12-05 DIAGNOSIS — Z91013 Allergy to seafood: Secondary | ICD-10-CM | POA: Diagnosis not present

## 2015-12-05 DIAGNOSIS — J3089 Other allergic rhinitis: Secondary | ICD-10-CM | POA: Diagnosis not present

## 2015-12-15 NOTE — Progress Notes (Signed)
Cardiology Office Note:    Date:  12/16/2015   ID:  Kelli Velasquez, DOB 02-Jun-1941, MRN [id number]  PCP:  Loura Pardon, MD  Cardiologist:  Dr. Lauree Chandler   Electrophysiologist:  n/a  Referring MD: Abner Greenspan, MD   Chief Complaint  Patient presents with  . Shortness of Breath    History of Present Illness:     Kelli Velasquez is a 75 y.o. female with a hx of CAD, HTN, arthritis, obesity, chronic hypoxic respiratory failure. She was admitted in 1/11 with non-STEMI. LHC demonstrated severe 2 vessel CAD. She underwent PCI with Promus DES to the LAD and Promus DES to the RCA. She has been taken off of beta blocker in the past secondary to wheezing and bronchitis. Last seen by Dr. Angelena Form 7/16.   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  FU CXR 11/04/15 with findings c/w CHF.  She called in to the office in her Lasix dose was increased. Last week she noted continued LE edema and her Lasix was increased further. She returns for follow-up. Overall, she has not really noticed much of a change in her chronic dyspnea with exertion. She is NYHA 2b. She denies orthopnea or PND. LE edema is improved with higher dose Lasix. Her weight is down 4 pounds. She denies chest discomfort. She denies syncope.   Past Medical History  Diagnosis Date  . 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 Hyperlipidemia   . Hypertension   . Obesity   . Neoplasm of skin     neoplasm of uncertain behavior of skin  . Vertigo   . Myopia   . Overactive bladder   . Urinary incontinence   . Osteoarthritis   . Asthma   . Allergy     allergic rhinitis  . Rash     and other non specific skin eruptions  . Gallstones   . Coronary artery disease     cath January 2011 with DEs LAD and RCA  . GERD (gastroesophageal reflux disease)   . Nocturnal oxygen desaturation     o2 at night  . Wears glasses   . Full dentures     Past Surgical History  Procedure Laterality Date    . Total knee arthroplasty  2010    left , then vocal cord infection post op  . Vocal cord polypectomy    . Cholecystectomy  89  . Joint replacement  2007    right total knee replacement  . Dilation and curettage of uterus  1984  . Colonoscopy    . Coronary angioplasty with stent placement  07/2009    stent  . Incisional hernia repair N/A 05/16/2013    Procedure: HERNIA REPAIR INFRAUMILICAL INCISIONAL;  Surgeon: Joyice Faster. Cornett, MD;  Location: [facility];  Service: General;  Laterality: N/A;  umbilical  . Insertion of mesh N/A 05/16/2013    Procedure: INSERTION OF MESH;  Surgeon: Joyice Faster. Cornett, MD;  Location: [facility];  Service: General;  Laterality: N/A;  umbilical  . Video bronchoscopy Bilateral 07/05/2013    Procedure: VIDEO BRONCHOSCOPY WITH FLUORO;  Surgeon: Tanda Rockers, MD;  Location: [facility];  Service: Cardiopulmonary;  Laterality: Bilateral;    Current Medications: Outpatient Prescriptions Prior to Visit  Medication Sig Dispense Refill  . amLODipine (NORVASC) 10 MG tablet Take 1 tablet (10 mg total) by mouth daily. 90 tablet 1  . Ascorbic Acid (VITAMIN C) 1000  MG tablet Take 1,000 mg by mouth daily.      Marland Kitchen aspirin 81 MG tablet Take 81 mg by mouth daily.    . calcium-vitamin D (OSCAL WITH D 500-200) 500-200 MG-UNIT per tablet Take 1 tablet by mouth daily.      . Cyanocobalamin (B-12 PO) Take 1 capsule by mouth daily.    Marland Kitchen EPIPEN 2-PAK 0.3 MG/0.3ML SOAJ injection INJECT 0.3 MLS (0.3 MG TOTAL) INTO THE MUSCLE ONCE AS NEEDED FOR ALLERGIC REACTION 2 Device 0  . esomeprazole (NEXIUM) 40 MG capsule Take 1 capsule (40 mg total) by mouth daily. 30 capsule 11  . fluticasone (FLOVENT HFA) 110 MCG/ACT inhaler Inhale 2 puffs into the lungs 2 (two) times daily. 1 Inhaler 12  . furosemide (LASIX) 40 MG tablet Take 1 tablet (40 mg total) by mouth daily. 30 tablet 11  . guaiFENesin-codeine (ROBITUSSIN AC) 100-10 MG/5ML syrup Take 5 mLs by mouth 3  (three) times daily as needed for cough.    . montelukast (SINGULAIR) 10 MG tablet Take 1 tablet (10 mg total) by mouth at bedtime. 30 tablet 11  . nitroGLYCERIN (NITROSTAT) 0.4 MG SL tablet Place 1 tablet (0.4 mg total) under the tongue every 5 (five) minutes as needed for chest pain. 25 tablet 6  . OXYGEN Inhale into the lungs. 2 liters QHS    . potassium chloride SA (K-DUR,KLOR-CON) 20 MEQ tablet Take 1 tablet (20 mEq total) by mouth daily. 30 tablet 11  . PROAIR HFA 108 (90 BASE) MCG/ACT inhaler INHALE 2 PUFFS BY MOUTH EVERY 4 HOURS AS NEEDED FOR WHEEZING OR FOR SHORTNESS OF BREATH 8 Inhaler 5  . ranitidine (ZANTAC) 150 MG tablet Take one pill by mouth twice daily    . Respiratory Therapy Supplies (FLUTTER) DEVI Use as directed 1 each 0  . triamcinolone (NASACORT) 55 MCG/ACT AERO nasal inhaler Place 2 sprays into the nose daily.    Marland Kitchen UNABLE TO FIND Med Name: b-12 injections per Dr. Glori Bickers    . ZETIA 10 MG tablet TAKE 1 TABLET (10 MG TOTAL) BY MOUTH DAILY. 30 tablet 11  . predniSONE (DELTASONE) 10 MG tablet Reported on 12/16/2015     No facility-administered medications prior to visit.      Allergies:   Shellfish allergy; Aspirin; Atorvastatin; Crestor; Fish allergy; Penicillins; Simvastatin; and Trandolapril   Social History   Social History  . Marital Status: Married    Spouse Name: N/A  . Number of Children: N/A  . Years of Education: N/A   Social History Main Topics  . Smoking status: Former Smoker -- 1.00 packs/day for 25 years    Types: Cigarettes    Quit date: 07/27/1982  . Smokeless tobacco: Never Used  . Alcohol Use: 0.0 oz/week    0 Standard drinks or equivalent per week     Comment: wine-rare  . Drug Use: No  . Sexual Activity: Not Asked   Other Topics Concern  . None   Social History Narrative     Family History:  The patient's family history includes Breast cancer in her maternal grandmother; Diabetes (age of onset: 79) in her mother; Stroke in her father and  mother. There is no history of Colon cancer.   ROS:   Please see the history of present illness.    Review of Systems  Cardiovascular: Positive for dyspnea on exertion.  Respiratory: Positive for cough and wheezing.    All other systems reviewed and are negative.   Physical Exam:  VS:  BP 150/60 mmHg  Pulse 100  Ht 5\' 5"  (1.651 m)  Wt 253 lb (114.76 kg)  BMI 42.10 kg/m2  SpO2 86%  LMP 07/27/1980   GEN: Well nourished, well developed, in no acute distress HEENT: normal Neck:  JVD difficult to assess, no masses Cardiac: Normal S1/S2, RRR; no murmurs, rubs, or gallops, trace bilateral LE edema;     Respiratory:  Bibasilar crackles, no wheezing  GI: soft, nontender, nondistended MS: no deformity or atrophy Skin: warm and dry Neuro: No focal deficits  Psych: Alert and oriented x 3, normal affect  Wt Readings from Last 3 Encounters:  12/16/15 253 lb (114.76 kg)  11/04/15 253 lb (114.76 kg)  10/23/15 249 lb (112.946 kg)      Studies/Labs Reviewed:     EKG:  EKG is   ordered today.  The ekg ordered today demonstrates NSR, HR 94, low voltage, QTc 455 ms, no sig change from prior tracing.  Recent Labs: 04/10/2015: ALT 22 11/04/2015: Hemoglobin 14.9; Platelets 303.0 12/16/2015: Brain Natriuretic Peptide 54.5; BUN 11; Creat 1.01*; Potassium 4.2; Sodium 140   Recent Lipid Panel    Component Value Date/Time   CHOL 166 10/09/2014 0904   TRIG 128.0 10/09/2014 0904   HDL 52.00 10/09/2014 0904   CHOLHDL 3 10/09/2014 0904   VLDL 25.6 10/09/2014 0904   LDLCALC 88 10/09/2014 0904   LDLDIRECT 124.8 09/01/2007 0909    Additional studies/ records that were reviewed today include:   Myoview 9/15 Overall Impression: Low risk stress nuclear study with no significant ischemia identified. LV Ejection Fraction: 58%. LV Wall Motion: NL LV Function; NL Wall Motion  Echo 1/11 Mild LVH, EF 0000000, grade 1 diastolic dysfunction, mild LAE  LHC 1/11 LM normal LAD prox 30-40%, mid  95% LCx normal RCA prox 99%, mid 90% EF 55% PCI Promus DES to mLAD and Promus DES to prox and mid RCA   ASSESSMENT:     1. Chronic diastolic heart failure ([place])   2. Atherosclerosis of native coronary artery of native heart without angina pectoris   3. Essential hypertension   4. Hyperlipidemia   5. Chronic respiratory failure with hypoxia and hypercapnia ([facility])     PLAN:     In order of problems listed above:  1. Chronic diastolic CHF -  Patient was recently seen in follow-up by pulmonology. Chest x-ray suggested interstitial edema. She called this office and her Lasix dose was adjusted.  Her LE edema is improved as well as her weight. Her baseline dyspnea with exertion is stable. On exam, she looks fairly stable. However, exam is somewhat difficult. I will obtain a follow-up BMET, BNP today. I will also obtain a follow-up echocardiogram. O2 sats were somewhat depressed today. She usually only wears oxygen at night. I have asked her to follow-up with pulmonology regarding her hypoxia.  2. CAD - History of DES to the mid LAD and DES to the mid RCA in the setting of non-STEMI in 2011. Last Myoview in 2015 low risk with no ischemia. She is not having anginal symptoms. Continue aspirin. She is intolerant to statins. Continue Zetia.  3. HTN - Blood pressure is elevated. Continue to monitor. Continue Norvasc, Lasix for now.  4. HL - As noted, she is intolerant to statins. Continue Zetia.  5. Chronic respiratory failure - She is followed by Dr. Melvyn Novas for asthma and BOOP. She recently completed a course of prednisone.  FU with Pulmonology as noted given her low O2 sats.  Medication Adjustments/Labs and Tests Ordered: Current medicines are reviewed at length with the patient today.  Concerns regarding medicines are outlined above.  Medication changes, Labs and Tests ordered today are outlined in the Patient Instructions noted below. Patient Instructions  Medication Instructions:  Your  physician recommends that you continue on your current medications as directed. Please refer to the Current Medication list given to you today. Labwork: TODAY BMET, BNP Testing/Procedures: Your physician has requested that you have an echocardiogram. Echocardiography is a painless test that uses sound waves to create images of your heart. It provides your doctor with information about the size and shape of your heart and how well your heart's chambers and valves are working. This procedure takes approximately one hour. There are no restrictions for this procedure. Follow-Up: KEEP YOUR APPT WITH DR. Angelena Form IN 01/2016 Any Other Special Instructions Will Be Listed Below (If Applicable). PER Venia Riveron, PAC RECOMMENDATION FOR YOU TO CALL DR. Marlene Lard OFFICE FOR A SOONER APPT THAN 01/2016; YOU WILL NEED TO LET THEM KNOW YOU OXYGEN LEVEL HAS DECREASED If you need a refill on your cardiac medications before your next appointment, please call your pharmacy.    Signed, Richardson Dopp, PA-C  12/16/2015 5:40 PM    [facility] [address], [place], [place]  [address] Phone: [phone]; Fax: [phone]

## 2015-12-16 ENCOUNTER — Encounter: Payer: Self-pay | Admitting: Physician Assistant

## 2015-12-16 ENCOUNTER — Telehealth: Payer: Self-pay | Admitting: *Deleted

## 2015-12-16 ENCOUNTER — Ambulatory Visit (INDEPENDENT_AMBULATORY_CARE_PROVIDER_SITE_OTHER): Payer: Medicare Other | Admitting: Physician Assistant

## 2015-12-16 VITALS — BP 150/60 | HR 100 | Ht 65.0 in | Wt 253.0 lb

## 2015-12-16 DIAGNOSIS — I10 Essential (primary) hypertension: Secondary | ICD-10-CM

## 2015-12-16 DIAGNOSIS — J9612 Chronic respiratory failure with hypercapnia: Secondary | ICD-10-CM

## 2015-12-16 DIAGNOSIS — E785 Hyperlipidemia, unspecified: Secondary | ICD-10-CM

## 2015-12-16 DIAGNOSIS — I2510 Atherosclerotic heart disease of native coronary artery without angina pectoris: Secondary | ICD-10-CM | POA: Diagnosis not present

## 2015-12-16 DIAGNOSIS — I5030 Unspecified diastolic (congestive) heart failure: Secondary | ICD-10-CM | POA: Diagnosis not present

## 2015-12-16 DIAGNOSIS — I5032 Chronic diastolic (congestive) heart failure: Secondary | ICD-10-CM | POA: Diagnosis not present

## 2015-12-16 DIAGNOSIS — J9611 Chronic respiratory failure with hypoxia: Secondary | ICD-10-CM

## 2015-12-16 LAB — BASIC METABOLIC PANEL
BUN: 11 mg/dL (ref 7–25)
CALCIUM: 9.2 mg/dL (ref 8.6–10.4)
CO2: 34 mmol/L — AB (ref 20–31)
CREATININE: 1.01 mg/dL — AB (ref 0.60–0.93)
Chloride: 99 mmol/L (ref 98–110)
GLUCOSE: 99 mg/dL (ref 65–99)
Potassium: 4.2 mmol/L (ref 3.5–5.3)
Sodium: 140 mmol/L (ref 135–146)

## 2015-12-16 LAB — BRAIN NATRIURETIC PEPTIDE: Brain Natriuretic Peptide: 54.5 pg/mL (ref <100)

## 2015-12-16 NOTE — Patient Instructions (Addendum)
Medication Instructions:  Your physician recommends that you continue on your current medications as directed. Please refer to the Current Medication list given to you today. Labwork: TODAY BMET, BNP Testing/Procedures: Your physician has requested that you have an echocardiogram. Echocardiography is a painless test that uses sound waves to create images of your heart. It provides your doctor with information about the size and shape of your heart and how well your heart's chambers and valves are working. This procedure takes approximately one hour. There are no restrictions for this procedure. Follow-Up: KEEP YOUR APPT WITH DR. Angelena Form IN 01/2016 Any Other Special Instructions Will Be Listed Below (If Applicable). PER SCOTT WEAVER, PAC RECOMMENDATION FOR YOU TO CALL DR. Marlene Lard OFFICE FOR A SOONER APPT THAN 01/2016; YOU WILL NEED TO LET THEM KNOW YOU OXYGEN LEVEL HAS DECREASED If you need a refill on your cardiac medications before your next appointment, please call your pharmacy.

## 2015-12-16 NOTE — Telephone Encounter (Signed)
Pt notified of lab results by phone with verbal understanding.  

## 2015-12-17 ENCOUNTER — Telehealth: Payer: Self-pay | Admitting: Internal Medicine

## 2015-12-17 NOTE — Telephone Encounter (Signed)
Start back on prednisone 5 mg daily until ov and adjust 02 to keep it at or above 90% at all times

## 2015-12-17 NOTE — Telephone Encounter (Signed)
Spoke with pt. She is aware of MW's recommendations. Rx has been sent in. Pt made an appointment to see MW on 12/20/15 at 11:15am. She wants to be tested for exertional oxygen. Nothing further was needed.

## 2015-12-17 NOTE — Telephone Encounter (Signed)
Spoke with pt and she states that she was seen by cardiology yesterday and her O2 was 86%. Pt did go home and use her O2 @ 2L yesterday and today with sats @ 94%. She did go without it for a little while this morning and she dropped to 89% so she put it back on. Pt normally only uses O2 qhs. Pt would like to know what you recommend.   MW Please advise. Thanks!  LOV  11/04/15 Next OV  02/04/16  Patient Instructions     Please remember to go to the lab and x-ray department downstairs for your tests - we will call you with the results when they are available.   Try prednisone 5 mg daily x 2 week, then 5 mg every other day x 2 weeks, then pred 5 mg every third day x 2 weeks and stop  At any point if worsen > go back to the previous dose that worked  Please schedule a follow up visit in 3 months but call sooner if needed

## 2015-12-18 ENCOUNTER — Ambulatory Visit (HOSPITAL_COMMUNITY)
Admission: RE | Admit: 2015-12-18 | Discharge: 2015-12-18 | Disposition: A | Discharge status: Home / Self Care | Payer: Medicare Other | Source: Ambulatory Visit | Attending: Physician Assistant | Admitting: Physician Assistant

## 2015-12-18 ENCOUNTER — Telehealth: Payer: Self-pay | Admitting: Internal Medicine

## 2015-12-18 DIAGNOSIS — I5032 Chronic diastolic (congestive) heart failure: Secondary | ICD-10-CM | POA: Diagnosis not present

## 2015-12-18 NOTE — Progress Notes (Signed)
*  PRELIMINARY RESULTS* Echocardiogram 2D Echocardiogram has been performed.  Kelli Velasquez 12/18/2015, 3:57 PM

## 2015-12-18 NOTE — Telephone Encounter (Signed)
Called spoke with pt. She states she is going out of town next week and needs verification that she is on oxygen before she can purchase the portable tank. She states the company is [employer] at [phone] and the fax number is [phone].   Called spoke with Rodman Pickle at [employer]. She states that she needs a verbal order stating that the patient does use oxygen and an ov note faxed to the fax number listed above. I explained to her that I would send the ov on today and verbal order was given. She voiced understanding and had no further questions. Ov notes have been faxed. Nothing further needed.   Called spoke with pt. I informed her that the ov notes have been faxed and that nothing further was needed from our office according to [employer]. She voiced understanding and had no further questions.

## 2015-12-19 ENCOUNTER — Emergency Department
Admission: EM | Admit: 2015-12-19 | Discharge: 2015-12-19 | Disposition: A | Discharge status: Home / Self Care | Payer: Medicare Other | Attending: Emergency Medicine | Admitting: Emergency Medicine

## 2015-12-19 ENCOUNTER — Other Ambulatory Visit: Payer: Self-pay | Admitting: Physician Assistant

## 2015-12-19 ENCOUNTER — Encounter: Payer: Self-pay | Admitting: Physician Assistant

## 2015-12-19 ENCOUNTER — Encounter: Payer: Self-pay | Admitting: Medical Oncology

## 2015-12-19 ENCOUNTER — Ambulatory Visit: Payer: Medicare Other | Admitting: Physician Assistant

## 2015-12-19 ENCOUNTER — Emergency Department: Payer: Medicare Other

## 2015-12-19 DIAGNOSIS — S52132A Displaced fracture of neck of left radius, initial encounter for closed fracture: Secondary | ICD-10-CM | POA: Diagnosis not present

## 2015-12-19 DIAGNOSIS — Y939 Activity, unspecified: Secondary | ICD-10-CM | POA: Diagnosis not present

## 2015-12-19 DIAGNOSIS — M7989 Other specified soft tissue disorders: Secondary | ICD-10-CM | POA: Diagnosis not present

## 2015-12-19 DIAGNOSIS — Z96651 Presence of right artificial knee joint: Secondary | ICD-10-CM | POA: Insufficient documentation

## 2015-12-19 DIAGNOSIS — J9610 Chronic respiratory failure, unspecified whether with hypoxia or hypercapnia: Secondary | ICD-10-CM | POA: Diagnosis not present

## 2015-12-19 DIAGNOSIS — M1990 Unspecified osteoarthritis, unspecified site: Secondary | ICD-10-CM | POA: Insufficient documentation

## 2015-12-19 DIAGNOSIS — S6992XA Unspecified injury of left wrist, hand and finger(s), initial encounter: Secondary | ICD-10-CM | POA: Diagnosis present

## 2015-12-19 DIAGNOSIS — Z79899 Other long term (current) drug therapy: Secondary | ICD-10-CM | POA: Insufficient documentation

## 2015-12-19 DIAGNOSIS — I10 Essential (primary) hypertension: Secondary | ICD-10-CM | POA: Insufficient documentation

## 2015-12-19 DIAGNOSIS — I5032 Chronic diastolic (congestive) heart failure: Secondary | ICD-10-CM

## 2015-12-19 DIAGNOSIS — Z87891 Personal history of nicotine dependence: Secondary | ICD-10-CM | POA: Insufficient documentation

## 2015-12-19 DIAGNOSIS — Z7951 Long term (current) use of inhaled steroids: Secondary | ICD-10-CM | POA: Insufficient documentation

## 2015-12-19 DIAGNOSIS — M25561 Pain in right knee: Secondary | ICD-10-CM | POA: Diagnosis not present

## 2015-12-19 DIAGNOSIS — Y999 Unspecified external cause status: Secondary | ICD-10-CM | POA: Diagnosis not present

## 2015-12-19 DIAGNOSIS — Z7982 Long term (current) use of aspirin: Secondary | ICD-10-CM | POA: Diagnosis not present

## 2015-12-19 DIAGNOSIS — I2510 Atherosclerotic heart disease of native coronary artery without angina pectoris: Secondary | ICD-10-CM | POA: Diagnosis not present

## 2015-12-19 DIAGNOSIS — E785 Hyperlipidemia, unspecified: Secondary | ICD-10-CM | POA: Insufficient documentation

## 2015-12-19 DIAGNOSIS — W19XXXA Unspecified fall, initial encounter: Secondary | ICD-10-CM

## 2015-12-19 DIAGNOSIS — S8001XA Contusion of right knee, initial encounter: Secondary | ICD-10-CM

## 2015-12-19 DIAGNOSIS — S52502A Unspecified fracture of the lower end of left radius, initial encounter for closed fracture: Secondary | ICD-10-CM

## 2015-12-19 DIAGNOSIS — Y92511 Restaurant or cafe as the place of occurrence of the external cause: Secondary | ICD-10-CM | POA: Insufficient documentation

## 2015-12-19 DIAGNOSIS — S52592A Other fractures of lower end of left radius, initial encounter for closed fracture: Secondary | ICD-10-CM | POA: Diagnosis not present

## 2015-12-19 DIAGNOSIS — J45909 Unspecified asthma, uncomplicated: Secondary | ICD-10-CM | POA: Insufficient documentation

## 2015-12-19 DIAGNOSIS — W108XXA Fall (on) (from) other stairs and steps, initial encounter: Secondary | ICD-10-CM | POA: Diagnosis not present

## 2015-12-19 DIAGNOSIS — I252 Old myocardial infarction: Secondary | ICD-10-CM | POA: Insufficient documentation

## 2015-12-19 MED ORDER — TRAMADOL HCL 50 MG PO TABS: 50.0000 mg | ORAL_TABLET | Freq: Four times a day (QID) | ORAL | Status: DC | PRN

## 2015-12-19 MED ORDER — TRAMADOL HCL 50 MG PO TABS
50.0000 mg | ORAL_TABLET | Freq: Once | ORAL | Status: AC
  Administered 2015-12-19: 50 mg via ORAL
  Filled 2015-12-19: qty 1

## 2015-12-19 NOTE — ED Notes (Signed)
Pt reports she fell down 3 steps pta, mechanical fall. Denies dizziness. Denies hitting head or LOC. C/o pain to rt knee and left wrist.

## 2015-12-19 NOTE — ED Notes (Signed)
States she missed a step at [employer] this afternoon  Landed on right knee and hyperextended left wrist  Positive pulses   Good sensation

## 2015-12-19 NOTE — Discharge Instructions (Signed)
Cast or Splint Care °Casts and splints support injured limbs and keep bones from moving while they heal. It is important to care for your cast or splint at home.   °HOME CARE INSTRUCTIONS °· Keep the cast or splint uncovered during the drying period. It can take 24 to 48 hours to dry if it is made of plaster. A fiberglass cast will dry in less than 1 hour. °· Do not rest the cast on anything harder than a pillow for the first 24 hours. °· Do not put weight on your injured limb or apply pressure to the cast until your health care provider gives you permission. °· Keep the cast or splint dry. Wet casts or splints can lose their shape and may not support the limb as well. A wet cast that has lost its shape can also create harmful pressure on your skin when it dries. Also, wet skin can become infected. °· Cover the cast or splint with a plastic bag when bathing or when out in the rain or snow. If the cast is on the trunk of the body, take sponge baths until the cast is removed. °· If your cast does become wet, dry it with a towel or a blow dryer on the cool setting only. °· Keep your cast or splint clean. Soiled casts may be wiped with a moistened cloth. °· Do not place any hard or soft foreign objects under your cast or splint, such as cotton, toilet paper, lotion, or powder. °· Do not try to scratch the skin under the cast with any object. The object could get stuck inside the cast. Also, scratching could lead to an infection. If itching is a problem, use a blow dryer on a cool setting to relieve discomfort. °· Do not trim or cut your cast or remove padding from inside of it. °· Exercise all joints next to the injury that are not immobilized by the cast or splint. For example, if you have a long leg cast, exercise the hip joint and toes. If you have an arm cast or splint, exercise the shoulder, elbow, thumb, and fingers. °· Elevate your injured arm or leg on 1 or 2 pillows for the first 1 to 3 days to decrease  swelling and pain. It is best if you can comfortably elevate your cast so it is higher than your heart. °SEEK MEDICAL CARE IF:  °· Your cast or splint cracks. °· Your cast or splint is too tight or too loose. °· You have unbearable itching inside the cast. °· Your cast becomes wet or develops a soft spot or area. °· You have a bad smell coming from inside your cast. °· You get an object stuck under your cast. °· Your skin around the cast becomes red or raw. °· You have new pain or worsening pain after the cast has been applied. °SEEK IMMEDIATE MEDICAL CARE IF:  °· You have fluid leaking through the cast. °· You are unable to move your fingers or toes. °· You have discolored (blue or white), cool, painful, or very swollen fingers or toes beyond the cast. °· You have tingling or numbness around the injured area. °· You have severe pain or pressure under the cast. °· You have any difficulty with your breathing or have shortness of breath. °· You have chest pain. °  °This information is not intended to replace advice given to you by your health care provider. Make sure you discuss any questions you have with your health care   provider.   Document Released: 07/10/2000 Document Revised: 05/03/2013 Document Reviewed: 01/19/2013 Elsevier Interactive Patient Education 2016 [employer]  Forearm Fracture A forearm fracture is a break in one or both of the bones of your arm that are between the elbow and the wrist. Your forearm is made up of two bones:  Radius. This is the bone on the inside of your arm near your thumb.  Ulna. This is the bone on the outside of your arm near your little finger. Middle forearm fractures usually break both the radius and the ulna. Most forearm fractures that involve both the ulna and radius will require surgery. CAUSES Common causes of this type of fracture include:  Falling on an outstretched arm.  Accidents, such as a car or bike accident.  A hard, direct hit to the middle  part of your arm. RISK FACTORS You may be at higher risk for this type of fracture if:  You play contact sports.  You have a condition that causes your bones to be weak or thin (osteoporosis). SIGNS AND SYMPTOMS A forearm fracture causes pain immediately after the injury. Other signs and symptoms include:  An abnormal bend or bump in your arm (deformity).  Swelling.  Numbness or tingling.  Tenderness.  Inability to turn your hand from side to side (rotate).  Bruising. DIAGNOSIS Your health care provider may diagnose a forearm fracture based on:  Your symptoms.  Your medical history, including any recent injury.  A physical exam. Your health care provider will look for any deformity and feel for tenderness over the break. Your health care provider will also check whether the bones are out of place.  An X-ray exam to confirm the diagnosis and learn more about the type of fracture. TREATMENT The goals of treatment are to get the bone or bones in proper position for healing and to keep the bones from moving so they will heal over time. Your treatment will depend on many factors, especially the type of fracture that you have.  If the fractured bone or bones:  Are in the correct position (nondisplaced), you may only need to wear a cast or a splint.  Have a slightly displaced fracture, you may need to have the bones moved back into place manually (closed reduction) before the splint or cast is put on.  You may have a temporary splint before you have a cast. The splint allows room for some swelling. After a few days, a cast can replace the splint.  You may have to wear the cast for 6-8 weeks or as directed by your health care provider.  The cast may be changed after about 3 weeks or as directed by your health care provider.  After your cast is removed, you may need physical therapy to regain full movement in your wrist or elbow.  You may need emergency surgery if you  have:  A fractured bone or bones that are out of position (displaced).  A fracture with multiple fragments (comminuted fracture).  A fracture that breaks the skin (open fracture). This type of fracture may require surgical wires, plates, or screws to hold the bone or bones in place.  You may have X-rays every couple of weeks to check on your healing. HOME CARE INSTRUCTIONS If You Have a Cast:  Do not stick anything inside the cast to scratch your skin. Doing that increases your risk of infection.  Check the skin around the cast every day. Report any concerns to your health care  provider. You may put lotion on dry skin around the edges of the cast. Do not apply lotion to the skin underneath the cast. If You Have a Splint:  Wear it as directed by your health care provider. Remove it only as directed by your health care provider.  Loosen the splint if your fingers become numb and tingle, or if they turn cold and blue. Bathing  Cover the cast or splint with a watertight plastic bag to protect it from water while you bathe or shower. Do not let the cast or splint get wet. Managing Pain, Stiffness, and Swelling  If directed, apply ice to the injured area:  Put ice in a plastic bag.  Place a towel between your skin and the bag.  Leave the ice on for 20 minutes, 2-3 times a day.  Move your fingers often to avoid stiffness and to lessen swelling.  Raise the injured area above the level of your heart while you are sitting or lying down. Driving  Do not drive or operate heavy machinery while taking pain medicine.  Do not drive while wearing a cast or splint on a hand that you use for driving. Activity  Return to your normal activities as directed by your health care provider. Ask your health care provider what activities are safe for you.  Perform range-of-motion exercises only as directed by your health care provider. Safety  Do not use your injured limb to support your body  weight until your health care provider says that you can. General Instructions  Do not put pressure on any part of the cast or splint until it is fully hardened. This may take several hours.  Keep the cast or splint clean and dry.  Do not use any tobacco products, including cigarettes, chewing tobacco, or electronic cigarettes. Tobacco can delay bone healing. If you need help quitting, ask your health care provider.  Take medicines only as directed by your health care provider.  Keep all follow-up visits as directed by your health care provider. This is important. SEEK MEDICAL CARE IF:  Your pain medicine is not helping.  Your cast or splint becomes wet or damaged or suddenly feels too tight.  Your cast becomes loose.  You have more severe pain or swelling than you did before the cast.  You have severe pain when you stretch your fingers.  You continue to have pain or stiffness in your elbow or your wrist after your cast is removed. SEEK IMMEDIATE MEDICAL CARE IF:  You cannot move your fingers.  You lose feeling in your fingers or your hand.  Your hand or your fingers turn cold and pale or blue.  You notice a bad smell coming from your cast.  You have drainage from underneath your cast.  You have new stains from blood or drainage that is coming through your cast.   This information is not intended to replace advice given to you by your health care provider. Make sure you discuss any questions you have with your health care provider.   Document Released: 07/10/2000 Document Revised: 08/03/2014 Document Reviewed: 02/26/2014 Elsevier Interactive Patient Education 2016 [employer].  Cryotherapy Cryotherapy means treatment with cold. Ice or gel packs can be used to reduce both pain and swelling. Ice is the most helpful within the first 24 to 48 hours after an injury or flare-up from overusing a muscle or joint. Sprains, strains, spasms, burning pain, shooting pain, and aches  can all be eased with ice. Ice can  also be used when recovering from surgery. Ice is effective, has very few side effects, and is safe for most people to use. PRECAUTIONS  Ice is not a safe treatment option for people with:  Raynaud phenomenon. This is a condition affecting small blood vessels in the extremities. Exposure to cold may cause your problems to return.  Cold hypersensitivity. There are many forms of cold hypersensitivity, including:  Cold urticaria. Red, itchy hives appear on the skin when the tissues begin to warm after being iced.  Cold erythema. This is a red, itchy rash caused by exposure to cold.  Cold hemoglobinuria. Red blood cells break down when the tissues begin to warm after being iced. The hemoglobin that carry oxygen are passed into the urine because they cannot combine with blood proteins fast enough.  Numbness or altered sensitivity in the area being iced. If you have any of the following conditions, do not use ice until you have discussed cryotherapy with your caregiver:  Heart conditions, such as arrhythmia, angina, or chronic heart disease.  High blood pressure.  Healing wounds or open skin in the area being iced.  Current infections.  Rheumatoid arthritis.  Poor circulation.  Diabetes. Ice slows the blood flow in the region it is applied. This is beneficial when trying to stop inflamed tissues from spreading irritating chemicals to surrounding tissues. However, if you expose your skin to cold temperatures for too long or without the proper protection, you can damage your skin or nerves. Watch for signs of skin damage due to cold. HOME CARE INSTRUCTIONS Follow these tips to use ice and cold packs safely.  Place a dry or damp towel between the ice and skin. A damp towel will cool the skin more quickly, so you may need to shorten the time that the ice is used.  For a more rapid response, add gentle compression to the ice.  Ice for no more than 10 to  20 minutes at a time. The bonier the area you are icing, the less time it will take to get the benefits of ice.  Check your skin after 5 minutes to make sure there are no signs of a poor response to cold or skin damage.  Rest 20 minutes or more between uses.  Once your skin is numb, you can end your treatment. You can test numbness by very lightly touching your skin. The touch should be so light that you do not see the skin dimple from the pressure of your fingertip. When using ice, most people will feel these normal sensations in this order: cold, burning, aching, and numbness.  Do not use ice on someone who cannot communicate their responses to pain, such as small children or people with dementia. HOW TO MAKE AN ICE PACK Ice packs are the most common way to use ice therapy. Other methods include ice massage, ice baths, and cryosprays. Muscle creams that cause a cold, tingly feeling do not offer the same benefits that ice offers and should not be used as a substitute unless recommended by your caregiver. To make an ice pack, do one of the following:  Place crushed ice or a bag of frozen vegetables in a sealable plastic bag. Squeeze out the excess air. Place this bag inside another plastic bag. Slide the bag into a pillowcase or place a damp towel between your skin and the bag.  Mix 3 parts water with 1 part rubbing alcohol. Freeze the mixture in a sealable plastic bag. When you remove  the mixture from the freezer, it will be slushy. Squeeze out the excess air. Place this bag inside another plastic bag. Slide the bag into a pillowcase or place a damp towel between your skin and the bag. SEEK MEDICAL CARE IF:  You develop white spots on your skin. This may give the skin a blotchy (mottled) appearance.  Your skin turns blue or pale.  Your skin becomes waxy or hard.  Your swelling gets worse. MAKE SURE YOU:   Understand these instructions.  Will watch your condition.  Will get help right  away if you are not doing well or get worse.   This information is not intended to replace advice given to you by your health care provider. Make sure you discuss any questions you have with your health care provider.   Document Released: 03/09/2011 Document Revised: 08/03/2014 Document Reviewed: 03/09/2011 Elsevier Interactive Patient Education 2016 [employer]  Periosteal Hematoma Periosteal hematoma (bone bruise) is a localized, tender, raised area close to the bone. It can occur from a small hidden fracture of the bone, following surgery, or from other trauma to the area. It typically occurs in bones located close to the surface of the skin, such as the shin, knee, and heel bone. Although it may take 2 or more weeks to completely heal, bone bruises typically are not associated with permanent or serious damage to the bone. If you are taking blood thinners, you may be at greater risk for such injuries.  CAUSES  A bone bruise is usually caused by high-impact trauma to the bone, but it can be caused by sports injuries or twisting injuries. SIGNS AND SYMPTOMS   Severe pain around the injured area that typically lasts longer than a normal bruise.  Difficulty using the bruised area.  Tender, raised area close to the bone.  Discoloration or swelling of the bruised area. DIAGNOSIS  You may need an MRI of the injured area to confirm a bone bruise if your health care provider feels it is necessary. A regular X-ray will not detect a bone bruise, but it will detect a broken bone (fracture). An X-ray may be taken to rule out any fractures. TREATMENT  Often, the best treatment for a bone bruise is resting, icing, and applying cold compresses to the injured area. Over-the-counter medicines may also be recommended for pain control. HOME CARE INSTRUCTIONS  Some things you can do to improve the condition are:   Rest and elevate the area of injury as long as it is very tender or swollen.  Apply ice  to the injured area:  Put ice in a plastic bag.  Place a towel between your skin and the bag.  Leave the ice on for 20 minutes, 2-3 times a day.  Use an elastic wrap to reduce swelling and protect the injured area. Make sure it is not applied too tightly. If the area around the wrap becomes cold or blue, the wrap is too tight. Wrap it more loosely.  For activity:  Follow your health care provider's instructions about whether walking with crutches is required. This will depend on how serious your condition is.  Start weight bearing gradually on the bruised part.  Continue to use crutches or a cane until you can stand without causing pain, or as instructed.  If a plaster splint was applied:  Wear the splint until you are seen for a follow-up exam.  Rest it on nothing harder than a pillow the first 24 hours.  Do not  put weight on it.  Do not get it wet. You may take it off to take a shower or bath.  You may have been given an elastic bandage to use with or without the plaster splint. The splint is too tight if you have numbness or tingling, or if the skin around the bandage becomes cold and blue. Adjust the bandage to make it comfortable.  If an air splint was applied:  You may alter the amount of air in the splint as needed for comfort.  You may take it off at night and to take a shower or bath.  If the injury was in either leg, wiggle your toes in the splint several times per day if you are able.  Only take over-the-counter or prescription medicines for pain, discomfort, or fever as directed by your health care provider.  Keep all follow-up visits with your health care provider. This includes any orthopedic referrals, physical therapy, and rehabilitation. Any delay in getting necessary care could result in a delay or failure of the bones to heal. SEEK MEDICAL CARE IF:   You have an increase in bruising, swelling, tenderness, heat, or pain over your injury.  You notice  coldness of your toes that does not improve after removing a splint or bandage.  Your pain is not lessened after you take medicine.  You have increased difficulty bearing weight on the injured leg, if the injury is in either leg. SEEK IMMEDIATE MEDICAL CARE IF:   You have severe pain near the injured area or severe pain with stretching.  You have increased swelling that resulted in a tense, hard area or a loss of sensation in the area of the injury.  You have pale, cool skin below the area of the injury (in an extremity) that does not go away after removing a splint or bandage. MAKE SURE YOU:   Understand these instructions.  Will watch your condition.  Will get help right away if you are not doing well or get worse.   This information is not intended to replace advice given to you by your health care provider. Make sure you discuss any questions you have with your health care provider.   Document Released: 08/20/2004 Document Revised: 05/03/2013 Document Reviewed: 12/30/2012 Elsevier Interactive Patient Education [employer].

## 2015-12-19 NOTE — ED Provider Notes (Signed)
[facility] Emergency Department Provider Note  ____________________________________________  Time seen: Approximately 5:47 PM  I have reviewed the triage vital signs and the nursing notes.   HISTORY  Chief Complaint Fall; Wrist Pain; and Knee Pain    HPI Kelli Velasquez is a 75 y.o. female who presents emergency department complaining of right knee and left wrist pain. Patient was at a restaurant when she suffered a mechanical fall and landed on her right knee and left wrist. Patient reports that she went home and apply ice to areas but states that swelling, ecchymosis, and pain have increased. 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 Patient reports that she is able to bear weight but has diffuse anterior knee pain with weightbearing and movement. Patient reports that the pain in her wrist is much greater is constant at all times. It is moderate to severe. She denies any numbness or tingling in any extremity. She denies hitting her head or losing consciousness at any point. Patient has no other complaints at this time. She has taken Tylenol prior to arrival with minimal relief.   Past Medical History  Diagnosis Date  . 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 Hyperlipidemia   . Hypertension   . Obesity   . Neoplasm of skin     neoplasm of uncertain behavior of skin  . Vertigo   . Myopia   . Overactive bladder   . Urinary incontinence   . Osteoarthritis   . Asthma   . Allergy     allergic rhinitis  . Rash     and other non specific skin eruptions  . Gallstones   . Coronary artery disease     cath January 2011 with DEs LAD and RCA  . GERD (gastroesophageal reflux disease)   . Nocturnal oxygen desaturation     o2 at night  . Wears glasses   . Full dentures     Patient Active Problem List   Diagnosis Date Noted  . Supraumbilical hernia [id number]  . Lump in the abdomen  04/10/2015  . GERD (gastroesophageal reflux disease) 10/10/2014  . B12 deficiency 10/09/2014  . Skin lesions 10/09/2014  . Cataracts, bilateral 10/09/2014  . Dry eyes 10/09/2014  . Fatigue 05/08/2014  . Pedal edema 05/08/2014  . Shoulder tendonitis 01/24/2014  . Osteopenia 11/28/2013  . Estrogen deficiency 11/02/2013  . Dyspnea 06/27/2013  . Hernia, incisional 04/03/2013  . 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 Cough 12/18/2012  . Chronic respiratory failure with hypoxia and hypercapnia ([place]) 10/26/2012  . Special screening for malignant neoplasms, colon 06/16/2011  . DYSPHONIA 10/10/2010  . HYPERGLYCEMIA 05/27/2010  . CAD, NATIVE VESSEL 08/19/2009  . 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 Severe obesity (BMI >= 40) ([place]) 09/01/2007  . MYOPIA 03/03/2007  . Hyperlipidemia 10/15/2006  . Essential hypertension 10/15/2006  . Allergic rhinitis 10/15/2006  . Cough variant asthma vs UACS  10/15/2006  . OVERACTIVE BLADDER 10/15/2006  . OSTEOARTHRITIS 10/15/2006  . Urinary incontinence 10/15/2006    Past Surgical History  Procedure Laterality Date  . Total knee arthroplasty  2010    left , then vocal cord infection post op  . Vocal cord polypectomy    . Cholecystectomy  89  . Joint replacement  2007    right total knee replacement  . Dilation and curettage of uterus  1984  . Colonoscopy    . Coronary angioplasty  with stent placement  07/2009    stent  . Incisional hernia repair N/A 05/16/2013    Procedure: HERNIA REPAIR INFRAUMILICAL INCISIONAL;  Surgeon: Joyice Faster. Cornett, MD;  Location: [facility] [place];  Service: General;  Laterality: N/A;  umbilical  . Insertion of mesh N/A 05/16/2013    Procedure: INSERTION OF MESH;  Surgeon: Joyice Faster. Cornett, MD;  Location: [place];  Service: General;  Laterality: N/A;  umbilical  . Video bronchoscopy Bilateral 07/05/2013    Procedure: VIDEO BRONCHOSCOPY WITH FLUORO;   Surgeon: Tanda Rockers, MD;  Location: [facility];  Service: Cardiopulmonary;  Laterality: Bilateral;    Current Outpatient Rx  Name  Route  Sig  Dispense  Refill  . amLODipine (NORVASC) 10 MG tablet   Oral   Take 1 tablet (10 mg total) by mouth daily.   90 tablet   1   . Ascorbic Acid (VITAMIN C) 1000 MG tablet   Oral   Take 1,000 mg by mouth daily.           Marland Kitchen aspirin 81 MG tablet   Oral   Take 81 mg by mouth daily.         Marland Kitchen azelastine (ASTELIN) 0.1 % nasal spray      USE 1-2 SPRAYS IN EACH NOSTRIL TWICE A DAY      3   . calcium-vitamin D (OSCAL WITH D 500-200) 500-200 MG-UNIT per tablet   Oral   Take 1 tablet by mouth daily.           . Cyanocobalamin (B-12 PO)   Oral   Take 1 capsule by mouth daily.         Marland Kitchen EPIPEN 2-PAK 0.3 MG/0.3ML SOAJ injection      INJECT 0.3 MLS (0.3 MG TOTAL) INTO THE MUSCLE ONCE AS NEEDED FOR ALLERGIC REACTION   2 Device   0   . esomeprazole (NEXIUM) 40 MG capsule   Oral   Take 1 capsule (40 mg total) by mouth daily.   30 capsule   11   . fluticasone (FLOVENT HFA) 110 MCG/ACT inhaler   Inhalation   Inhale 2 puffs into the lungs 2 (two) times daily.   1 Inhaler   12   . furosemide (LASIX) 40 MG tablet   Oral   Take 1 tablet (40 mg total) by mouth daily.   30 tablet   11   . guaiFENesin-codeine (ROBITUSSIN AC) 100-10 MG/5ML syrup   Oral   Take 5 mLs by mouth 3 (three) times daily as needed for cough.         . montelukast (SINGULAIR) 10 MG tablet   Oral   Take 1 tablet (10 mg total) by mouth at bedtime.   30 tablet   11   . nitroGLYCERIN (NITROSTAT) 0.4 MG SL tablet   Sublingual   Place 1 tablet (0.4 mg total) under the tongue every 5 (five) minutes as needed for chest pain.   25 tablet   6   . OXYGEN   Inhalation   Inhale into the lungs. 2 liters QHS         . potassium chloride SA (K-DUR,KLOR-CON) 20 MEQ tablet   Oral   Take 1 tablet (20 mEq total) by mouth daily.   30 tablet   11   .  PROAIR HFA 108 (90 BASE) MCG/ACT inhaler      INHALE 2 PUFFS BY MOUTH EVERY 4 HOURS AS NEEDED FOR WHEEZING OR FOR  SHORTNESS OF BREATH   8 Inhaler   5   . ranitidine (ZANTAC) 150 MG tablet      Take one pill by mouth twice daily         . Respiratory Therapy Supplies (FLUTTER) DEVI      Use as directed   1 each   0   . traMADol (ULTRAM) 50 MG tablet   Oral   Take 1 tablet (50 mg total) by mouth every 6 (six) hours as needed.   20 tablet   0   . triamcinolone (NASACORT) 55 MCG/ACT AERO nasal inhaler   Nasal   Place 2 sprays into the nose daily.         Marland Kitchen UNABLE TO FIND      Med Name: b-12 injections per Dr. Glori Bickers         . ZETIA 10 MG tablet      TAKE 1 TABLET (10 MG TOTAL) BY MOUTH DAILY.   30 tablet   11     Dispense as written.     Allergies Shellfish allergy; Aspirin; Atorvastatin; Crestor; Fish allergy; Penicillins; Simvastatin; and Trandolapril  Family History  Problem Relation Age of Onset  . Stroke Mother   . Stroke Father   . Colon cancer Neg Hx   . Breast cancer Maternal Grandmother   . Diabetes Mother 50    Social History Social History  Substance Use Topics  . Smoking status: Former Smoker -- 1.00 packs/day for 25 years    Types: Cigarettes    Quit date: 07/27/1982  . Smokeless tobacco: Never Used  . Alcohol Use: 0.0 oz/week    0 Standard drinks or equivalent per week     Comment: wine-rare     Review of Systems  Constitutional: No fever/chills Eyes: No visual changes. Cardiovascular: no chest pain. Respiratory: no cough. No SOB. Musculoskeletal: Positive for right knee pain. Positive for left wrist pain. Skin: Negative for rash, abrasions, lacerations. Positive for ecchymosis to right knee and left wrist. Neurological: Negative for headaches, focal weakness or numbness. 10-point ROS otherwise negative.  ____________________________________________   PHYSICAL EXAM:  VITAL SIGNS: ED Triage Vitals  Enc Vitals Group      BP 12/19/15 1719 149/64 mmHg     Pulse Rate 12/19/15 1719 95     Resp 12/19/15 1719 20     Temp 12/19/15 1719 98 F (36.7 C)     Temp Source 12/19/15 1719 Oral     SpO2 12/19/15 1719 93 %     Weight 12/19/15 1719 250 lb (113.399 kg)     Height 12/19/15 1719 5\' 5"  (1.651 m)     Head Cir --      Peak Flow --      Pain Score 12/19/15 1720 10     Pain Loc --      Pain Edu? --      Excl. in [place]? --      Constitutional: Alert and oriented. Well appearing and in no acute distress. Eyes: Conjunctivae are normal. PERRL. EOMI. Head: Atraumatic. Neck: No stridor.     Cardiovascular: Normal rate, regular rhythm. Normal S1 and S2.  Good peripheral circulation. Respiratory: Normal respiratory effort without tachypnea or retractions. Lungs CTAB. Good air entry to the bases with no decreased or absent breath sounds. Musculoskeletal: Full range of motion to all extremities. No gross deformities appreciated. There is edema and ecchymosis noted to the left wrist upon inspection. Patient is diffusely tender to palpation over the  proximal carpal bones and distal radius and ulna. No palpable abnormality. Limited range of motion of the wrist due to pain. Full range of motion all digits of the hand. Radial pulse and cap refill intact left upper extremity. Sensation intact 5 digits left hand. Edema and ecchymosis noted to the right knee upon inspection. Patient is diffusely tender to palpation of the anterior aspect with no point tenderness. Full range of motion to knee. No palpable abnormality. Dorsalis pedis pulse and sensation intact distal 10 pain. Neurologic:  Normal speech and language. No gross focal neurologic deficits are appreciated.  Skin:  Skin is warm, dry and intact. No rash noted. Psychiatric: Mood and affect are normal. Speech and behavior are normal. Patient exhibits appropriate insight and judgement.   ____________________________________________   LABS (all labs ordered are listed, but  only abnormal results are displayed)  Labs Reviewed - No data to display ____________________________________________  EKG   ____________________________________________  RADIOLOGY Diamantina Providence Cuthriell, personally viewed and evaluated these images (plain radiographs) as part of my medical decision making, as well as reviewing the written report by the radiologist.  Dg Wrist Complete Left  12/19/2015  CLINICAL DATA:  Status post fall down 3 stairs, with left wrist pain and swelling. Initial encounter. EXAM: LEFT WRIST - COMPLETE 3+ VIEW COMPARISON:  None. FINDINGS: There is a minimally displaced fracture through the distal radial metaphysis. No additional fractures are seen. The carpal rows appear grossly intact, and demonstrate normal alignment. Degenerative change is noted at the first carpometacarpal joint, with associated sclerosis. Soft tissue swelling is noted about the wrist. IMPRESSION: Minimally displaced fracture through the distal radial metaphysis. Electronically Signed   By: Garald Balding M.D.   On: 12/19/2015 18:29   Dg Knee Complete 4 Views Right  12/19/2015  CLINICAL DATA:  Right knee pain status post fall. EXAM: RIGHT KNEE - COMPLETE 4+ VIEW COMPARISON:  03/17/2006 FINDINGS: No evidence of fracture, dislocation, or joint effusion. There has been a prior 3 component total right knee arthroplasty with normal alignment of the orthopedic hardware and no evidence of loosening. Soft tissues demonstrate frontal and lateral swelling. IMPRESSION: No acute fracture or dislocation identified about the right knee, status post total right knee arthroplasty. Soft tissue swelling. Electronically Signed   By: Fidela Salisbury M.D.   On: 12/19/2015 18:29    ____________________________________________    PROCEDURES  Procedure(s) performed:       Medications  traMADol (ULTRAM) tablet 50 mg (50 mg Oral Given 12/19/15 1818)      ____________________________________________   INITIAL IMPRESSION / ASSESSMENT AND PLAN / ED COURSE  Pertinent labs & imaging results that were available during my care of the patient were reviewed by me and considered in my medical decision making (see chart for details).  Patient's diagnosis is consistent with Fall resulting in a fracture of the distal radius to the left wrist and contusion to the right knee. Patient is splinted in the emergency department. Patient will be discharged home with prescriptions for tramadol for pain. Patient is to follow up with orthopedic surgeon. Patient is given ED precautions to return to the ED for any worsening or new symptoms.     ____________________________________________  FINAL CLINICAL IMPRESSION(S) / ED DIAGNOSES  Final diagnoses:  Fall, initial encounter  Distal radius fracture, left, closed, initial encounter  Knee contusion, right, initial encounter      NEW MEDICATIONS STARTED DURING THIS VISIT:  New Prescriptions   TRAMADOL (ULTRAM) 50 MG TABLET  Take 1 tablet (50 mg total) by mouth every 6 (six) hours as needed.        This chart was dictated using voice recognition software/Dragon. Despite best efforts to proofread, errors can occur which can change the meaning. Any change was purely unintentional.    Darletta Moll, PA-C 12/19/15 1900

## 2015-12-20 ENCOUNTER — Ambulatory Visit: Payer: Medicare Other | Admitting: Internal Medicine

## 2015-12-20 ENCOUNTER — Telehealth: Payer: Self-pay | Admitting: *Deleted

## 2015-12-20 DIAGNOSIS — I5032 Chronic diastolic (congestive) heart failure: Secondary | ICD-10-CM

## 2015-12-20 DIAGNOSIS — S52522A Torus fracture of lower end of left radius, initial encounter for closed fracture: Secondary | ICD-10-CM | POA: Diagnosis not present

## 2015-12-20 NOTE — Telephone Encounter (Signed)
Pt notified of echo results and findings . Pt agreeable to limited echo w/definity to reassess wall motion. Pt said she wil be out of town from 5/31-6/10. I will have pcc call her and schedule echo to be done week of 6/12. Pt agreeable.

## 2015-12-30 ENCOUNTER — Telehealth: Payer: Self-pay | Admitting: Family Medicine

## 2015-12-30 DIAGNOSIS — E538 Deficiency of other specified B group vitamins: Secondary | ICD-10-CM

## 2015-12-30 DIAGNOSIS — R7309 Other abnormal glucose: Secondary | ICD-10-CM

## 2015-12-30 DIAGNOSIS — Z0000 Encounter for general adult medical examination without abnormal findings: Secondary | ICD-10-CM

## 2015-12-30 NOTE — Telephone Encounter (Signed)
-----   Message from Marchia Bond sent at 12/30/2015  2:55 PM EDT ----- Regarding: Cpx labs Mon 6/12, need orders. Thanks! :-) Please order  future cpx labs for pt's upcoming lab appt. Thanks Aniceto Boss

## 2015-12-31 ENCOUNTER — Other Ambulatory Visit: Payer: Self-pay | Admitting: Family Medicine

## 2016-01-03 ENCOUNTER — Other Ambulatory Visit: Payer: Medicare Other

## 2016-01-06 ENCOUNTER — Other Ambulatory Visit (INDEPENDENT_AMBULATORY_CARE_PROVIDER_SITE_OTHER): Payer: Medicare Other

## 2016-01-06 ENCOUNTER — Telehealth: Payer: Self-pay | Admitting: Internal Medicine

## 2016-01-06 DIAGNOSIS — S52522A Torus fracture of lower end of left radius, initial encounter for closed fracture: Secondary | ICD-10-CM | POA: Diagnosis not present

## 2016-01-06 DIAGNOSIS — E538 Deficiency of other specified B group vitamins: Secondary | ICD-10-CM | POA: Diagnosis not present

## 2016-01-06 DIAGNOSIS — Z0000 Encounter for general adult medical examination without abnormal findings: Secondary | ICD-10-CM

## 2016-01-06 DIAGNOSIS — R7309 Other abnormal glucose: Secondary | ICD-10-CM | POA: Diagnosis not present

## 2016-01-06 DIAGNOSIS — Z96659 Presence of unspecified artificial knee joint: Secondary | ICD-10-CM | POA: Diagnosis not present

## 2016-01-06 LAB — CBC WITH DIFFERENTIAL/PLATELET
BASOS PCT: 0.6 % (ref 0.0–3.0)
Basophils Absolute: 0.1 10*3/uL (ref 0.0–0.1)
EOS PCT: 2.9 % (ref 0.0–5.0)
Eosinophils Absolute: 0.4 10*3/uL (ref 0.0–0.7)
HEMATOCRIT: 35 % — AB (ref 36.0–46.0)
HEMOGLOBIN: 11.4 g/dL — AB (ref 12.0–15.0)
LYMPHS PCT: 8.8 % — AB (ref 12.0–46.0)
Lymphs Abs: 1.2 10*3/uL (ref 0.7–4.0)
MCHC: 32.5 g/dL (ref 30.0–36.0)
MCV: 91.5 fl (ref 78.0–100.0)
MONOS PCT: 4.7 % (ref 3.0–12.0)
Monocytes Absolute: 0.7 10*3/uL (ref 0.1–1.0)
Neutro Abs: 11.4 10*3/uL — ABNORMAL HIGH (ref 1.4–7.7)
Neutrophils Relative %: 83 % — ABNORMAL HIGH (ref 43.0–77.0)
Platelets: 405 10*3/uL — ABNORMAL HIGH (ref 150.0–400.0)
RBC: 3.83 Mil/uL — AB (ref 3.87–5.11)
RDW: 17.1 % — AB (ref 11.5–15.5)
WBC: 13.8 10*3/uL — AB (ref 4.0–10.5)

## 2016-01-06 LAB — COMPREHENSIVE METABOLIC PANEL
ALBUMIN: 3.8 g/dL (ref 3.5–5.2)
ALT: 14 U/L (ref 0–35)
AST: 14 U/L (ref 0–37)
Alkaline Phosphatase: 75 U/L (ref 39–117)
BUN: 17 mg/dL (ref 6–23)
CALCIUM: 9.3 mg/dL (ref 8.4–10.5)
CHLORIDE: 99 meq/L (ref 96–112)
CO2: 35 mEq/L — ABNORMAL HIGH (ref 19–32)
Creatinine, Ser: 0.77 mg/dL (ref 0.40–1.20)
GFR: 77.49 mL/min (ref >60.00)
Glucose, Bld: 120 mg/dL — ABNORMAL HIGH (ref 70–99)
POTASSIUM: 4 meq/L (ref 3.5–5.1)
Sodium: 141 mEq/L (ref 135–145)
Total Bilirubin: 0.6 mg/dL (ref 0.2–1.2)
Total Protein: 7.4 g/dL (ref 6.0–8.3)

## 2016-01-06 LAB — LIPID PANEL
Cholesterol: 166 mg/dL (ref 0–200)
HDL: 47.7 mg/dL (ref >39.00)
LDL CALC: 89 mg/dL (ref 0–99)
NONHDL: 118.73
Total CHOL/HDL Ratio: 3
Triglycerides: 147 mg/dL (ref 0.0–149.0)
VLDL: 29.4 mg/dL (ref 0.0–40.0)

## 2016-01-06 LAB — VITAMIN B12: Vitamin B-12: 645 pg/mL (ref 211–911)

## 2016-01-06 LAB — HEMOGLOBIN A1C: Hgb A1c MFr Bld: 5.1 % (ref 4.6–6.5)

## 2016-01-06 LAB — TSH: TSH: 1.96 u[IU]/mL (ref 0.35–4.50)

## 2016-01-06 NOTE — Telephone Encounter (Signed)
Spoke with pt  She is having increased cough on pred 5 mg  She felt better on 10  Will increase again to 10 mg per AVS instrcutions and call if not improving

## 2016-01-06 NOTE — Telephone Encounter (Signed)
Please remember to go to the lab and x-ray department downstairs for your tests - we will call you with the results when they are available.   Try prednisone 5 mg daily x 2 week, then 5 mg every other day x 2 weeks, then pred 5 mg every third day x 2 weeks and stop  At any point if worsen > go back to the previous dose that worked  Please schedule a follow up visit in 3 months but call sooner if needed   ? Did she try going back to the previous dose that worked??? LMTCB

## 2016-01-06 NOTE — Telephone Encounter (Signed)
Pt returning call.Kelli Velasquez ° °

## 2016-01-07 ENCOUNTER — Telehealth: Payer: Self-pay | Admitting: Internal Medicine

## 2016-01-07 ENCOUNTER — Other Ambulatory Visit (HOSPITAL_COMMUNITY): Payer: Medicare Other

## 2016-01-07 MED ORDER — PREDNISONE 10 MG PO TABS: 10.0000 mg | ORAL_TABLET | ORAL | Status: DC

## 2016-01-07 NOTE — Telephone Encounter (Signed)
Pred was sent to pharm  Pt aware and nothing further needed

## 2016-01-08 ENCOUNTER — Ambulatory Visit (INDEPENDENT_AMBULATORY_CARE_PROVIDER_SITE_OTHER): Payer: Medicare Other | Admitting: Family Medicine

## 2016-01-08 ENCOUNTER — Encounter: Payer: Self-pay | Admitting: Family Medicine

## 2016-01-08 ENCOUNTER — Ambulatory Visit: Payer: Medicare Other

## 2016-01-08 VITALS — BP 136/70 | HR 105 | Temp 98.0°F | Ht 64.5 in | Wt 257.5 lb

## 2016-01-08 DIAGNOSIS — M8580 Other specified disorders of bone density and structure, unspecified site: Secondary | ICD-10-CM

## 2016-01-08 DIAGNOSIS — E538 Deficiency of other specified B group vitamins: Secondary | ICD-10-CM | POA: Diagnosis not present

## 2016-01-08 DIAGNOSIS — I10 Essential (primary) hypertension: Secondary | ICD-10-CM

## 2016-01-08 DIAGNOSIS — E785 Hyperlipidemia, unspecified: Secondary | ICD-10-CM

## 2016-01-08 DIAGNOSIS — Z0000 Encounter for general adult medical examination without abnormal findings: Secondary | ICD-10-CM | POA: Diagnosis not present

## 2016-01-08 DIAGNOSIS — R7309 Other abnormal glucose: Secondary | ICD-10-CM

## 2016-01-08 MED ORDER — CYANOCOBALAMIN 1000 MCG/ML IJ SOLN
1000.0000 ug | Freq: Once | INTRAMUSCULAR | Status: AC
  Administered 2016-01-08: 1000 ug via INTRAMUSCULAR

## 2016-01-08 NOTE — Patient Instructions (Addendum)
B12 shot today  We will enroll you in the cologuard program for colon cancer screening (I noted that you are a bit anemic)  If you want to do your medicare interview (Annual medicare visit)- at another time you can call to schedule that with our nurse Katha Cabal  Take a look at the info sheet on tetanus shots  Don't forget to schedule your mammogram  The two medicines I would consider for osteoporosis are Evista and Prolia  Call us in the future when you are ready to schedule your next bone density test  Keep working on healthy diet

## 2016-01-08 NOTE — Progress Notes (Signed)
Subjective:    Patient ID: TACORIA MCSWEENEY, female    DOB: [birth year], 75 y.o.   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 - starting to kick in  It does not bother her too much   Wt is up 7 lb with bmi of 43 Prednisone makes her more hungry-hard to control her weight  Morbid obese category  Cannot get much exercise due to breathing problems   One fall- fell down 2 stairs in a restaurant (unlabeled step)  Also has a splint on her L arm  Had a fall  Thank goodness she did not hurt her replaced knee    Zoster vaccine - cannot take due to prednisone   Tetanus vaccine 12/06  Mammogram 3/16-normal- has not set it up yet (plans to) Self breast exam- no breast lumps  Does not have any gyn problems   Colonoscopy 1/13-normal/was 10 year recall   dexa 4/15- osteopenia- pt declines another dexa until she heals from her fall and finishes other doctor visits  Has been on prednisone No bisphosphenates due to uncontrolled gerd and resp issues  Falls Fracture hx- recent L forearm  Ca and D- takes regularly   Up to date on immunizations   bp is stable today  No cp or palpitations or headaches or edema  No side effects to medicines  BP Readings from Last 3 Encounters:  01/08/16 136/70  12/19/15 128/95  12/16/15 150/60      Hx of B12 def Lab Results  Component Value Date   VITAMINB12 645 01/06/2016  due for shot today  Hyperlipidemia Lab Results  Component Value Date   CHOL 166 01/06/2016   CHOL 166 10/09/2014   CHOL 160 06/13/2013   Lab Results  Component Value Date   HDL 47.70 01/06/2016   HDL 52.00 10/09/2014   HDL 38.40* 06/13/2013   Lab Results  Component Value Date   LDLCALC 89 01/06/2016   LDLCALC 88 10/09/2014   LDLCALC 85 06/13/2013   Lab Results  Component Value Date   TRIG 147.0 01/06/2016   TRIG 128.0 10/09/2014   TRIG  183.0* 06/13/2013   Lab Results  Component Value Date   CHOLHDL 3 01/06/2016   CHOLHDL 3 10/09/2014   CHOLHDL 4 06/13/2013   Lab Results  Component Value Date   LDLDIRECT 124.8 09/01/2007   LDLDIRECT 132.2 04/11/2007   LDLDIRECT 171.4 03/03/2007    Hyperglycemia Lab Results  Component Value Date   HGBA1C 5.1 01/06/2016  gave up fried foods/ eating better  Down from 5.4 Not a problem  Patient Active Problem List   Diagnosis Date Noted  . Routine general medical examination at a health care facility 12/30/2015  . Supraumbilical hernia [id number]  . Lump in the abdomen 04/10/2015  . GERD (gastroesophageal reflux disease) 10/10/2014  . B12 deficiency 10/09/2014  . Skin lesions 10/09/2014  . Cataracts, bilateral 10/09/2014  . Dry eyes 10/09/2014  . Fatigue 05/08/2014  . Pedal edema 05/08/2014  . Shoulder tendonitis 01/24/2014  . Osteopenia 11/28/2013  . Estrogen deficiency 11/02/2013  . Dyspnea 06/27/2013  . Hernia, incisional 04/03/2013  . 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 Cough 12/18/2012  . Chronic respiratory failure with hypoxia and hypercapnia ([place]) 10/26/2012  . Special screening for malignant neoplasms, colon 06/16/2011  . DYSPHONIA 10/10/2010  . HYPERGLYCEMIA 05/27/2010  . CAD, NATIVE  VESSEL 08/19/2009  . 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 Severe obesity (BMI >= 40) ([place]) 09/01/2007  . MYOPIA 03/03/2007  . Hyperlipidemia 10/15/2006  . Essential hypertension 10/15/2006  . Allergic rhinitis 10/15/2006  . Cough variant asthma vs UACS  10/15/2006  . OVERACTIVE BLADDER 10/15/2006  . OSTEOARTHRITIS 10/15/2006  . Urinary incontinence 10/15/2006   Past Medical History  Diagnosis Date  . Myocardial infarction ([place])     subendocardial, initial episode  . Hyperlipidemia   . Hypertension   . Obesity   . Neoplasm of skin     neoplasm of uncertain behavior of skin  . Vertigo   . Myopia   . Overactive bladder     . Urinary incontinence   . Osteoarthritis   . Asthma   . Allergy     allergic rhinitis  . Rash     and other non specific skin eruptions  . Gallstones   . Coronary artery disease     cath January 2011 with DEs LAD and RCA  . GERD (gastroesophageal reflux disease)   . Nocturnal oxygen desaturation     o2 at night  . Wears glasses   . Full dentures   . History of echocardiogram     Echo 5/17: mod LVH, EF 50-55%, ant-septal HK, Gr 1 DD, mod LAE   Past Surgical History  Procedure Laterality Date  . Total knee arthroplasty  2010    left , then vocal cord infection post op  . Vocal cord polypectomy    . Cholecystectomy  89  . Joint replacement  2007    right total knee replacement  . Dilation and curettage of uterus  1984  . Colonoscopy    . Coronary angioplasty with stent placement  07/2009    stent  . Incisional hernia repair N/A 05/16/2013    Procedure: HERNIA REPAIR INFRAUMILICAL INCISIONAL;  Surgeon: Joyice Faster. Cornett, MD;  Location: [facility];  Service: General;  Laterality: N/A;  umbilical  . Insertion of mesh N/A 05/16/2013    Procedure: INSERTION OF MESH;  Surgeon: Joyice Faster. Cornett, MD;  Location: [facility];  Service: General;  Laterality: N/A;  umbilical  . Video bronchoscopy Bilateral 07/05/2013    Procedure: VIDEO BRONCHOSCOPY WITH FLUORO;  Surgeon: Tanda Rockers, MD;  Location: [facility];  Service: Cardiopulmonary;  Laterality: Bilateral;   Social History  Substance Use Topics  . Smoking status: Former Smoker -- 1.00 packs/day for 25 years    Types: Cigarettes    Quit date: 07/27/1982  . Smokeless tobacco: Never Used  . Alcohol Use: 0.0 oz/week    0 Standard drinks or equivalent per week     Comment: wine-rare   Family History  Problem Relation Age of Onset  . Stroke Mother   . Stroke Father   . Colon cancer Neg Hx   . Breast cancer Maternal Grandmother   . Diabetes Mother 12   Allergies  Allergen Reactions  .  Shellfish Allergy Anaphylaxis  . Aspirin     REACTION: nausea and vomiting High doses  . Atorvastatin     REACTION: leg pain  . Crestor [Rosuvastatin Calcium] Other (See Comments)    Muscle pain - allergy/intolerance  . Fish Allergy   . Penicillins     Due to mold allergy per pt  . Simvastatin     REACTION: muscle pain  . Trandolapril     REACTION: leg pain   Current Outpatient Prescriptions on File  Prior to Visit  Medication Sig Dispense Refill  . amLODipine (NORVASC) 10 MG tablet TAKE 1 TABLET BY MOUTH DAILY. 90 tablet 1  . Ascorbic Acid (VITAMIN C) 1000 MG tablet Take 1,000 mg by mouth daily.      Marland Kitchen aspirin 81 MG tablet Take 81 mg by mouth daily.    Marland Kitchen azelastine (ASTELIN) 0.1 % nasal spray USE 1-2 SPRAYS IN EACH NOSTRIL TWICE A DAY  3  . calcium-vitamin D (OSCAL WITH D 500-200) 500-200 MG-UNIT per tablet Take 1 tablet by mouth daily.      . Cyanocobalamin (B-12 PO) Take 1 capsule by mouth daily.    Marland Kitchen EPIPEN 2-PAK 0.3 MG/0.3ML SOAJ injection INJECT 0.3 MLS (0.3 MG TOTAL) INTO THE MUSCLE ONCE AS NEEDED FOR ALLERGIC REACTION 2 Device 0  . esomeprazole (NEXIUM) 40 MG capsule Take 1 capsule (40 mg total) by mouth daily. 30 capsule 11  . fluticasone (FLOVENT HFA) 110 MCG/ACT inhaler Inhale 2 puffs into the lungs 2 (two) times daily. 1 Inhaler 12  . guaiFENesin-codeine (ROBITUSSIN AC) 100-10 MG/5ML syrup Take 5 mLs by mouth 3 (three) times daily as needed for cough.    . montelukast (SINGULAIR) 10 MG tablet Take 1 tablet (10 mg total) by mouth at bedtime. 30 tablet 11  . nitroGLYCERIN (NITROSTAT) 0.4 MG SL tablet Place 1 tablet (0.4 mg total) under the tongue every 5 (five) minutes as needed for chest pain. 25 tablet 6  . OXYGEN Inhale into the lungs. 2 liters QHS    . potassium chloride SA (K-DUR,KLOR-CON) 20 MEQ tablet Take 1 tablet (20 mEq total) by mouth daily. 30 tablet 11  . predniSONE (DELTASONE) 10 MG tablet Take 1 tablet (10 mg total) by mouth as directed. 60 tablet 1  . PROAIR  HFA 108 (90 BASE) MCG/ACT inhaler INHALE 2 PUFFS BY MOUTH EVERY 4 HOURS AS NEEDED FOR WHEEZING OR FOR SHORTNESS OF BREATH 8 Inhaler 5  . ranitidine (ZANTAC) 150 MG tablet Take one pill by mouth twice daily    . Respiratory Therapy Supplies (FLUTTER) DEVI Use as directed 1 each 0  . traMADol (ULTRAM) 50 MG tablet Take 1 tablet (50 mg total) by mouth every 6 (six) hours as needed. 20 tablet 0  . triamcinolone (NASACORT) 55 MCG/ACT AERO nasal inhaler Place 2 sprays into the nose daily.    Marland Kitchen UNABLE TO FIND Med Name: b-12 injections per Dr. Glori Bickers    . ZETIA 10 MG tablet TAKE 1 TABLET (10 MG TOTAL) BY MOUTH DAILY. 30 tablet 11   No current facility-administered medications on file prior to visit.    Review of Systems Review of Systems  Constitutional: Negative for fever, appetite change,  and unexpected weight change.  Eyes: Negative for pain and visual disturbance.  Respiratory: Negative for cough and shortness of breath.   Cardiovascular: Negative for cp or palpitations    Gastrointestinal: Negative for nausea, diarrhea and constipation.  Genitourinary: Negative for urgency and frequency.  Skin: Negative for pallor or rash   Neurological: Negative for weakness, light-headedness, numbness and headaches.  Hematological: Negative for adenopathy. Does not bruise/bleed easily.  Psychiatric/Behavioral: Negative for dysphoric mood. The patient is not nervous/anxious.         Objective:   Physical Exam  Constitutional: She appears well-developed and well-nourished. No distress.  Well appearing  HENT:  Head: Normocephalic and atraumatic.  Right Ear: External ear normal.  Left Ear: External ear normal.  Mouth/Throat: Oropharynx is clear and moist.  Eyes: Conjunctivae  and EOM are normal. Pupils are equal, round, and reactive to light. No scleral icterus.  Neck: Normal range of motion. Neck supple. No JVD present. Carotid bruit is not present. No thyromegaly present.  Cardiovascular: Normal rate,  regular rhythm, normal heart sounds and intact distal pulses.  Exam reveals no gallop.   Pulmonary/Chest: Effort normal and breath sounds normal. No respiratory distress. She has no wheezes. She exhibits no tenderness.  Diffusely distant bs  No wheeze  Abdominal: Soft. Bowel sounds are normal. She exhibits no distension, no abdominal bruit and no mass. There is no tenderness.  Genitourinary: No breast swelling, tenderness, discharge or bleeding.  Breast exam: No mass, nodules, thickening, tenderness, bulging, retraction, inflamation, nipple discharge or skin changes noted.  No axillary or clavicular LA.      Musculoskeletal: Normal range of motion. She exhibits no edema or tenderness.  Lymphadenopathy:    She has no cervical adenopathy.  Neurological: She is alert. She has normal reflexes. No cranial nerve deficit. She exhibits normal muscle tone. Coordination normal.  Skin: Skin is warm and dry. No rash noted. No erythema. No pallor.  Some solar lentigines  Psychiatric: She has a normal mood and affect.          Assessment & Plan:   Problem List Items Addressed This Visit      Cardiovascular and Mediastinum   Essential hypertension - Primary    bp in fair control at this time  BP Readings from Last 1 Encounters:  01/08/16 136/70   No changes needed Disc lifstyle change with low sodium diet and exercise   Labs reviewed  Wt loss enc       Relevant Medications   furosemide (LASIX) 20 MG tablet     Digestive   B12 deficiency    B12 shot today Lab Results  Component Value Date   VITAMINB12 645 01/06/2016         Relevant Medications   cyanocobalamin ((VITAMIN B-12)) injection 1,000 mcg (Completed)     Musculoskeletal and Integument   Osteopenia    Declines another dexa at this time -wants to put it off  Prednisone is a risk factor Also a fall  Cannot take bisphosphenates due to GERD -ongoing issues   Will investigate coverage of prolia and evista and get back to  [place]  Disc need for calcium/ vitamin D/ wt bearing exercise and bone density test every 2 y to monitor Disc safety/ fracture risk in detail          Other   Severe obesity (BMI >= 40) (HCC)    This is a struggle in light of prednisone Discussed how this problem influences overall health and the risks it imposes  Reviewed plan for weight loss with lower calorie diet (via better food choices and also portion control or program like weight watchers) and exercise building up to or more than 30 minutes 5 days per week including some aerobic activity         Routine general medical examination at a health care facility    Reviewed health habits including diet and exercise and skin cancer prevention Reviewed appropriate screening tests for age  Also reviewed health mt list, fam hx and immunization status , as well as social and family history   See HPI Labs reviewed Has not had time for AMW with multiple specialist f/u B12 shot today  We will enroll you in the cologuard program for colon cancer screening (I noted that you are a  bit anemic)  If you want to do your medicare interview (Annual medicare visit)- at another time you can call to schedule that with our nurse Katha Cabal  Take a look at the info sheet on tetanus shots  Don't forget to schedule your mammogram  The two medicines I would consider for osteoporosis are Evista and Prolia  Call us in the future when you are ready to schedule your next bone density test  Keep working on healthy diet       Hyperlipidemia    Disc goals for lipids and reasons to control them Rev labs with pt Rev low sat fat diet in detail       Relevant Medications   furosemide (LASIX) 20 MG tablet   HYPERGLYCEMIA    Lab Results  Component Value Date   HGBA1C 5.1 01/06/2016   Overall stable despite prednisone  Enc low glycemic diet and wt control

## 2016-01-08 NOTE — Progress Notes (Signed)
Pre visit review using our clinic review tool, if applicable. No additional management support is needed unless otherwise documented below in the visit note. 

## 2016-01-12 NOTE — Assessment & Plan Note (Signed)
This is a struggle in light of prednisone Discussed how this problem influences overall health and the risks it imposes  Reviewed plan for weight loss with lower calorie diet (via better food choices and also portion control or program like weight watchers) and exercise building up to or more than 30 minutes 5 days per week including some aerobic activity

## 2016-01-12 NOTE — Assessment & Plan Note (Addendum)
Reviewed health habits including diet and exercise and skin cancer prevention Reviewed appropriate screening tests for age  Also reviewed health mt list, fam hx and immunization status , as well as social and family history   See HPI Labs reviewed Has not had time for AMW with multiple specialist f/u B12 shot today  We will enroll you in the cologuard program for colon cancer screening (I noted that you are a bit anemic)  If you want to do your medicare interview (Annual medicare visit)- at another time you can call to schedule that with our nurse Katha Cabal  Take a look at the info sheet on tetanus shots  Don't forget to schedule your mammogram  The two medicines I would consider for osteoporosis are Evista and Prolia  Call us in the future when you are ready to schedule your next bone density test  Keep working on healthy diet

## 2016-01-12 NOTE — Assessment & Plan Note (Signed)
B12 shot today Lab Results  Component Value Date   [id number] 01/06/2016

## 2016-01-12 NOTE — Assessment & Plan Note (Signed)
Disc goals for lipids and reasons to control them Rev labs with pt Rev low sat fat diet in detail   

## 2016-01-12 NOTE — Assessment & Plan Note (Signed)
bp in fair control at this time  BP Readings from Last 1 Encounters:  01/08/16 136/70   No changes needed Disc lifstyle change with low sodium diet and exercise   Labs reviewed  Wt loss enc

## 2016-01-12 NOTE — Assessment & Plan Note (Signed)
Lab Results  Component Value Date   HGBA1C 5.1 01/06/2016   Overall stable despite prednisone  Enc low glycemic diet and wt control

## 2016-01-12 NOTE — Assessment & Plan Note (Signed)
Declines another dexa at this time -wants to put it off  Prednisone is a risk factor Also a fall  Cannot take bisphosphenates due to GERD -ongoing issues   Will investigate coverage of prolia and evista and get back to [place]  Disc need for calcium/ vitamin D/ wt bearing exercise and bone density test every 2 y to monitor Disc safety/ fracture risk in detail

## 2016-01-17 ENCOUNTER — Encounter: Payer: Self-pay | Admitting: Cardiovascular Disease

## 2016-01-20 DIAGNOSIS — S52522A Torus fracture of lower end of left radius, initial encounter for closed fracture: Secondary | ICD-10-CM | POA: Diagnosis not present

## 2016-01-29 ENCOUNTER — Other Ambulatory Visit: Payer: Self-pay

## 2016-01-29 ENCOUNTER — Ambulatory Visit (HOSPITAL_COMMUNITY): Payer: Medicare Other | Attending: Cardiovascular Disease

## 2016-01-29 ENCOUNTER — Encounter: Payer: Self-pay | Admitting: Physician Assistant

## 2016-01-29 DIAGNOSIS — I2510 Atherosclerotic heart disease of native coronary artery without angina pectoris: Secondary | ICD-10-CM | POA: Diagnosis not present

## 2016-01-29 DIAGNOSIS — I5032 Chronic diastolic (congestive) heart failure: Secondary | ICD-10-CM | POA: Insufficient documentation

## 2016-01-29 DIAGNOSIS — I110 Hypertensive heart disease with heart failure: Secondary | ICD-10-CM | POA: Diagnosis not present

## 2016-01-29 DIAGNOSIS — E785 Hyperlipidemia, unspecified: Secondary | ICD-10-CM | POA: Insufficient documentation

## 2016-01-29 DIAGNOSIS — E669 Obesity, unspecified: Secondary | ICD-10-CM | POA: Diagnosis not present

## 2016-01-29 DIAGNOSIS — Z6841 Body Mass Index (BMI) 40.0 and over, adult: Secondary | ICD-10-CM | POA: Diagnosis not present

## 2016-01-29 LAB — ECHOCARDIOGRAM LIMITED
E decel time: 144 msec
E/e' ratio: 16.09
FS: 27 % — AB (ref 28–44)
IV/PV OW: 1
LA diam index: 1.92 cm/m2
LASIZE: 42 mm
LEFT ATRIUM END SYS DIAM: 42 mm
LV E/e' medial: 16.09
LV E/e'average: 16.09
LV TDI E'LATERAL: 5.48
LV TDI E'MEDIAL: 4.17
LV e' LATERAL: 5.48 cm/s
MV Dec: 144
MV Peak grad: 3 mmHg
MV pk A vel: 125 m/s
MVPKEVEL: 88.2 m/s
PW: 12.9 mm — AB (ref 0.6–1.1)

## 2016-01-29 MED ORDER — PERFLUTREN LIPID MICROSPHERE
1.0000 mL | INTRAVENOUS | Status: AC | PRN
  Administered 2016-01-29: 2 mL via INTRAVENOUS

## 2016-01-31 ENCOUNTER — Telehealth: Payer: Self-pay | Admitting: *Deleted

## 2016-01-31 DIAGNOSIS — R0602 Shortness of breath: Secondary | ICD-10-CM

## 2016-01-31 NOTE — Telephone Encounter (Signed)
Pt has been notified of limited echo results and findings by phone with verbal understanding. Pt advised per Dr. Angelena Form and Richardson Dopp, PA to schedule Myoview. Pt states she will need to do Lexiscan due to sob. I went over instructions by phone and will mail instructions today to her. I advised pt I will have [facility] call her and schedule pt for test. Pt said thank you.

## 2016-02-03 ENCOUNTER — Telehealth (HOSPITAL_COMMUNITY): Payer: Self-pay | Admitting: *Deleted

## 2016-02-03 NOTE — Telephone Encounter (Signed)
Patient given detailed instructions per Myocardial Perfusion Study Information Sheet for the test on 02/05/16. Patient notified to arrive 15 minutes early and that it is imperative to arrive on time for appointment to keep from having the test rescheduled.  If you need to cancel or reschedule your appointment, please call the office within 24 hours of your appointment. Failure to do so may result in a cancellation of your appointment, and a $50 no show fee. Patient verbalized understanding. Bruno Leach J Ashia Dehner, RN  

## 2016-02-04 ENCOUNTER — Encounter: Payer: Self-pay | Admitting: Internal Medicine

## 2016-02-04 ENCOUNTER — Ambulatory Visit (INDEPENDENT_AMBULATORY_CARE_PROVIDER_SITE_OTHER): Payer: Medicare Other | Admitting: Internal Medicine

## 2016-02-04 VITALS — BP 130/80 | HR 93 | Ht 65.0 in | Wt 230.0 lb

## 2016-02-04 DIAGNOSIS — R918 Other nonspecific abnormal finding of lung field: Secondary | ICD-10-CM

## 2016-02-04 DIAGNOSIS — J45991 Cough variant asthma: Secondary | ICD-10-CM | POA: Diagnosis not present

## 2016-02-04 DIAGNOSIS — J9611 Chronic respiratory failure with hypoxia: Secondary | ICD-10-CM | POA: Diagnosis not present

## 2016-02-04 DIAGNOSIS — J9612 Chronic respiratory failure with hypercapnia: Secondary | ICD-10-CM | POA: Diagnosis not present

## 2016-02-04 MED ORDER — PREDNISONE 10 MG PO TABS: ORAL_TABLET | ORAL | Status: DC

## 2016-02-04 NOTE — Patient Instructions (Addendum)
Stop flovent and your hoarseness should improve  Prednisone 10 mg take 2 daily until 100% back to normal and then 1 x 2 weeks then one alternating with one half as you were before - at any point you get worse breathing or coughing then increase to previous dose - always take with breakfast  02 2lpm at bedtime and as needed during the day to maintain sats over 90%   Please schedule a follow up office visit in 6 weeks, call sooner if needed

## 2016-02-04 NOTE — Assessment & Plan Note (Signed)
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 > discussed with IR, rec FOB > done 07/05/2013 > nl airways > neg afb/fungus/path  - IR Bx 07/11/2013 > inflammatory, special stains pending but does not look like tb or fungus and responds short course pred per pt > rec prednisone x 6 days only and needs VATS bx but declines as of 07/13/2013 > rec'd final path from [place] review > c/w BOOP though can't exclude asp/infection sources - 07/19/2013 notified pt and will return 07/24/13 for cxr/ esr then consider steroid rx (needs crypto serology also) - 07/24/2013 ESR 42 off pred x 3 weeks - 07/24/13 > crypto serology neg/ Anti-CCP neg  - started maint   prednisone 09/06/2013  >>> cxr cleared 10/18/2013 on 20 mg per day > rec taper to 20/10 - 11/28/13 reduced to 10 mg daily  - 03/07/2014 5 mg x 2 weeks then 5 mg qod x 2 weeks and stopped pred  04/10/14  - ESR 04/16/2015  = 24 / recurrent nodules > rec pred 20 mg daily > nodules resolved on f/u cxr 06/14/2015  - 04/30/2015 rec ceiling of 20 and floor of 10/5  - ESR 11/04/2015 =  23  On pred 10/5, rec taper off x 6 weeks if tol> flared in June 2017 so resumed last week in June 2017 20 ceiling/ 10/5 floor   The goal with a chronic steroid dependent illness is always arriving at the lowest effective dose that controls the disease/symptoms and not accepting a set "formula" which is based on statistics or guidelines that don't always take into account patient  variability or the natural hx of the dz in every individual patient, which may well vary over time.  For now therefore I recommend the patient maintain  On pred and off flovent since did not eliminate systemic requirement  I had an extended discussion with the patient reviewing all relevant studies completed to date and  lasting 15 to 20 minutes of a 25 minute visit    Each maintenance medication was reviewed in  detail including most importantly the difference between maintenance and prns and under what circumstances the prns are to be triggered using an action plan format that is not reflected in the computer generated alphabetically organized AVS.    Please see instructions for details which were reviewed in writing and the patient given a copy highlighting the part that I personally wrote and discussed at today's ov.

## 2016-02-04 NOTE — Assessment & Plan Note (Signed)
-   PFT's 01/31/2013 wnl x low fef25-75 and 12% better fev1 p B2 - 06/26/2014 p extensive coaching HFA effectiveness =    75% . Start qvar 80 2bid  - trial of qvar 80 1 bid 10/01/14 > improved 01/02/15 > flared on low dose qvar 02/2015 > back on qvar 80 2bid rec  04/16/2015 > improved 04/30/2015  - added flutter 09/23/2015 >>>   - Singulair rx 10/23/15 def decrease in albuterol need/ decrease wheeze and improved sob > no better on flovent 02/04/2016 so d/c'd flovent

## 2016-02-04 NOTE — Assessment & Plan Note (Signed)
11/24/2012 Office walk showed desats 86% on RA  With  spirometry FEV1 nml-73% , ratio 80   11/25/2012 CTA Chest >>neg for PE/ ILD  12/07/12 ONO > Pos 5: 36min  begin O2 2 l/m At bedtime  12/13/2012  >  Repeat 02/01/13 on 2lpm 32min> no change rx  - 01/31/2013  Walked RA x 3 laps @ 185 ft each stopped due to  End of study, no desats  - 07/03/2013 sats 75% RA >  On 3lpm sats 93%  > rx with 24 h 02 at 3lpm   - improved by self monitoring as of 07/24/13 ov so changed rx to 2lpm/24/7 humidified due to nasal dryness - 09/06/2013  Walked 2lpm x  2 laps @ 185 ft each stopped due to legs tired, no desats    - 04/18/2014  Walked RA  @ mod pace x 2 laps @ 185 ft each stopped due to  Hip pain, slow pace, sats 89% at end  - 05/16/2014  [facility] RA  2 laps @ 185 ft each stopped due to sob/fatigue but no desats off pred x 04/10/14  - 04/16/2015  Walked RA x 3 laps @ 185 ft each stopped due to   - ONO RA   10/05/2014 >  02 sat < 89% 213 min so rec resume 2lpm and work on wt loss   - 04/16/2015  Walked RA x 3 laps @ 185 ft each stopped due to  91% End of study, nl pace, no sob or desat   - HCO3  01/06/16 = 35   As of  02/04/2016    02 2lpm at hs and prn to maintain > 90%

## 2016-02-04 NOTE — Progress Notes (Signed)
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 Follow-up    Pt had PET scan done this am. Her cough and SOB have improved since the last visit.   saba inhaler helps some/ min mucoid sputum, temp to 99 but no rigors, no purulent sputum or hemoptysis/ does have some wt. Loss, no sweating.  rec Dulera 100 Take 2 puffs first thing in am and then another 2 puffs about 12 hours later.  Prednisone 10 mg take  4 each am x 2 days,   2 each am x 2 days,  1 each am x 2 days and stop Only use your albuterol as a rescue medication  We will arrange 24/7 02 at 3lpm per advanced  We will call to arrange a biopsy of the easiest area once I discuss this with radiology  Late add: Discussed in detail all the  indications, usual  risks and alternatives  relative to the benefits with patient who agrees to proceed with bronchoscopy with biopsy.   FOB > done 07/05/2013 > nl airways > neg afb/fungus/path  - IR Bx 07/11/2013 > inflammatory, special stains pending but does not look like tb or fungus and responds short course pred per pt > rec prednisone x 6 days only and needs VATS bx but declines as of 07/13/2013 > rec'd final path from [facility] review > c/w BOOP though can't exclude asp/infection sources    11/04/2015  f/u ov/Kelli Velasquez re: BOOP/ cough variant asthma  10/5  Plus flovent 110/ singulair plus nexium ac and zantact hs Chief Complaint  Patient presents with  . Follow-up    Breathing is overall doing well. She has occ wheeze in the am. She is using rescue inhaler  2 x per wk on average.   cough/ rhinitis seasonal pattern much better on singulair per Dr Glori Bickers x 2 weeks   No  sob with adl's but not pushing herself much at this point  - waiting for the pool to open, limited by L hip  rec Try prednisone 5 mg daily x 2 week, then 5 mg every other day x 2 weeks, then pred 5 mg every third day x 2 weeks and stop At any point if worsen > go back to the previous dose that worked    02/04/2016  f/u ov/Kelli Velasquez re: BOOP/ chronic resp failure   Plus flovent 110/ singulair plus nexium ac and zantact hs Chief Complaint  Patient presents with  . Follow-up    Breathing has improved on 20 mg of pred- she increased from 10 mg 3 wks ago. She is not coughing much.   able to come off prednisone for 5 weeks, then gradually gradually worse sob / cough requiring 20 mg per day x 3 weeks and back to baseline next but quite hoarse ? From flovent and has seen cards with plan for stress test       No obvious day to day or daytime variabilty or assoc    cp  or chest tightness, subjective wheeze overt sinus or hb symptoms. No unusual exp hx or h/o childhood pna/ asthma or knowledge of premature birth.  Sleeping ok without nocturnal  or early am exacerbation  of respiratory  c/o's or need for noct saba. Also denies any obvious fluctuation of symptoms with weather or environmental changes or other aggravating or alleviating factors except as outlined above   Current Medications, Allergies, Complete Past Medical History, Past Surgical History, Family History, and Social History were reviewed in [employer] [facility] record.  ROS  The following are not active complaints unless bolded sore throat, dysphagia, dental problems, itching, sneezing,  nasal congestion or excess/ purulent secretions, ear ache,   fever, chills, sweats, unintended wt loss, pleuritic or exertional cp, hemoptysis,  orthopnea pnd or leg swelling, presyncope, palpitations, heartburn, abdominal pain, anorexia,  nausea, vomiting, diarrhea  or change in bowel or urinary habits, change in stools or urine, dysuria,hematuria,  rash, arthralgias, visual complaints, headache, numbness weakness or ataxia or problems with walking or coordination,  change in mood/affect or memory.           PMH:  -Hx of NSTEMI.07/2009 w/  cardiac cath s/p 2 stents in LAD/ RCA       >>Echo at that time showed Mild LVH, EF of 55% and grade 1 diastolic dysfuntion  -Hyperlipidemia -HTN  -OA -s/p bilat. Knee replacements - Obesity    SH:  Retired from [employer] work Former smoker -quit 1984 -smoked x 20y x 1 PPD  Married  No pets  No unusual hobbies  [place] on vacation frequently (1-10/2012 -traveled to [place], [place] and [place] )           Objective:   Physical Exam GEN: A/Ox3; pleasant , NAD, obese WF  / amb nad  cushingnoid / very s  01/31/2013   237  > 07/03/2013  232 > 07/24/2013  231 > 09/06/2013  233 > 04/18/2014 256  10/18/2013  233 > 11/29/2013 238 >  03/07/2014  249 > 05/17/2014  263 > 06/26/2014 261>   10/01/2014  247 > 01/02/2015  253 >  04/16/2015 247 >  04/30/2015  244 > 06/14/2015   246 > 09/23/2015   255 > 11/04/2015 253 > 02/04/2016  230  Vital signs reviewed / note sats 97% RA at rest   HEENT:  [place]/AT,  EACs-clear, TMs-wnl, NOSE-clear, THROAT-clear, no lesions, no postnasal drip or exudate noted.    Edentulous   NECK:  Supple w/ fair ROM; no JVD; normal carotid impulses w/o bruits; no thyromegaly or nodules palpated; no lymphadenopathy.  RESP    slt decreased bs bilaterally, no insp crackles or exp rhonchi/ mild pseudowheeze   CARD:  RRR, no m/r/g -  Min bilateral lower ext pitting edema, pulses intact, no cyanosis or clubbing., neg homans sign   GI:   Soft & nt; nml bowel sounds; no organomegaly or masses detected.  Musco: Warm bil, no deformities or joint swelling noted.  Bilateral knee scars             Assessment & Plan:

## 2016-02-05 ENCOUNTER — Ambulatory Visit (HOSPITAL_COMMUNITY): Payer: Medicare Other | Attending: Cardiology

## 2016-02-05 DIAGNOSIS — R0602 Shortness of breath: Secondary | ICD-10-CM | POA: Insufficient documentation

## 2016-02-05 DIAGNOSIS — R0789 Other chest pain: Secondary | ICD-10-CM | POA: Diagnosis not present

## 2016-02-05 DIAGNOSIS — I10 Essential (primary) hypertension: Secondary | ICD-10-CM | POA: Insufficient documentation

## 2016-02-05 DIAGNOSIS — R9439 Abnormal result of other cardiovascular function study: Secondary | ICD-10-CM | POA: Insufficient documentation

## 2016-02-05 MED ORDER — TECHNETIUM TC 99M TETROFOSMIN IV KIT
31.8000 | PACK | Freq: Once | INTRAVENOUS | Status: AC | PRN
  Administered 2016-02-05: 31.8 via INTRAVENOUS
  Filled 2016-02-05: qty 32

## 2016-02-05 MED ORDER — REGADENOSON 0.4 MG/5ML IV SOLN
0.4000 mg | Freq: Once | INTRAVENOUS | Status: AC
  Administered 2016-02-05: 0.4 mg via INTRAVENOUS

## 2016-02-06 ENCOUNTER — Encounter: Payer: Self-pay | Admitting: Physician Assistant

## 2016-02-06 ENCOUNTER — Ambulatory Visit (HOSPITAL_COMMUNITY): Payer: Medicare Other | Attending: Internal Medicine

## 2016-02-06 ENCOUNTER — Telehealth: Payer: Self-pay | Admitting: *Deleted

## 2016-02-06 ENCOUNTER — Ambulatory Visit (HOSPITAL_COMMUNITY): Payer: Medicare Other

## 2016-02-06 LAB — MYOCARDIAL PERFUSION IMAGING
CHL CUP NUCLEAR SDS: 0
CHL CUP NUCLEAR SRS: 4
CSEPPHR: 105 {beats}/min
LV dias vol: 113 mL (ref 46–106)
LV sys vol: 59 mL
RATE: 0.41
Rest HR: 97 {beats}/min
SSS: 4
TID: 0.91

## 2016-02-06 MED ORDER — TECHNETIUM TC 99M TETROFOSMIN IV KIT
32.9000 | PACK | Freq: Once | INTRAVENOUS | Status: AC | PRN
  Administered 2016-02-06: 32.9 via INTRAVENOUS
  Filled 2016-02-06: qty 33

## 2016-02-06 NOTE — Telephone Encounter (Signed)
Pt notified of myoview results by phone with verbal understanding. Pt asked for earlier time with Dr. Angelena Form 7/26 or 1-2 days before or after 02/19/16. I reviewed scheduled however did not see any open slots. Pt asked to call if cancellation.

## 2016-02-10 DIAGNOSIS — S52522D Torus fracture of lower end of left radius, subsequent encounter for fracture with routine healing: Secondary | ICD-10-CM | POA: Diagnosis not present

## 2016-02-13 DIAGNOSIS — R0902 Hypoxemia: Secondary | ICD-10-CM | POA: Diagnosis not present

## 2016-02-17 ENCOUNTER — Ambulatory Visit: Payer: Medicare Other | Admitting: Cardiovascular Disease

## 2016-02-19 ENCOUNTER — Encounter: Payer: Self-pay | Admitting: Cardiovascular Disease

## 2016-02-19 ENCOUNTER — Ambulatory Visit (INDEPENDENT_AMBULATORY_CARE_PROVIDER_SITE_OTHER): Payer: Medicare Other | Admitting: Cardiovascular Disease

## 2016-02-19 VITALS — BP 122/74 | HR 94 | Ht 65.0 in | Wt 247.0 lb

## 2016-02-19 DIAGNOSIS — I2510 Atherosclerotic heart disease of native coronary artery without angina pectoris: Secondary | ICD-10-CM | POA: Diagnosis not present

## 2016-02-19 DIAGNOSIS — E785 Hyperlipidemia, unspecified: Secondary | ICD-10-CM

## 2016-02-19 DIAGNOSIS — I10 Essential (primary) hypertension: Secondary | ICD-10-CM | POA: Diagnosis not present

## 2016-02-19 DIAGNOSIS — I5042 Chronic combined systolic (congestive) and diastolic (congestive) heart failure: Secondary | ICD-10-CM

## 2016-02-19 NOTE — Patient Instructions (Signed)

## 2016-02-19 NOTE — Progress Notes (Signed)
Chief Complaint  Patient presents with  . Follow-up  . Shortness of Breath     History of Present Illness: 75 yo female with h/o CAD, HTN, OA and obesity here today for cardiac follow up. She was admitted to [facility] from 07/27/09 to 07/31/09 with a NSTEMI. She had a cardiac cath on 07/29/09 showing severe two vessel disease and we placed Promus drug eluting stents in the LAD and the RCA. Beta blocker stopped due to wheezing with bronchitis in 2013. Her lung issues are followed by Dr. Melvyn Novas. Stress test September 2015 with no ischemia. She has had titration of her Lasix dosage this spring with improvement in volume status. Echo with LVEF=45-50%, no significant valve disease. Nuclear stress test with no ischemia.   She is here today for follow up. She has had no chest pain. Dyspnea improved on steroids. Weight is down. No LE edema.   Primary Care Physician: Loura Pardon, MD  Past Medical History:  Diagnosis Date  . Allergy    allergic rhinitis  . Asthma   . Coronary artery disease    cath January 2011 with DEs LAD and RCA  . Full dentures   . Gallstones   . GERD (gastroesophageal reflux disease)   . History of echocardiogram    Echo 5/17: mod LVH, EF 50-55%, ant-septal HK, Gr 1 DD, mod LAE //  b.  Echo 7/17: mild concentric LVH, EF 45-50%, inf-lat, inf, inf-septal HK, mild LAE  . History of nuclear stress test    Myoview 7/17: EF 48%, small mild apical defect, no ischemia, low risk  . Hyperlipidemia   . Hypertension   . Myocardial infarction ([place])    subendocardial, initial episode  . Myopia   . 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 Nocturnal oxygen desaturation    o2 at night  . Obesity   . Osteoarthritis   . Overactive bladder   . Rash    and other non specific skin eruptions  . Urinary incontinence   . Vertigo   . Wears glasses     Past Surgical History:  Procedure Laterality Date  . CHOLECYSTECTOMY  92  . COLONOSCOPY    . CORONARY  ANGIOPLASTY WITH STENT PLACEMENT  07/2009   stent  . DILATION AND CURETTAGE OF UTERUS  1984  . INCISIONAL HERNIA REPAIR N/A 05/16/2013   Procedure: HERNIA REPAIR INFRAUMILICAL INCISIONAL;  Surgeon: Joyice Faster. Cornett, MD;  Location: [place];  Service: General;  Laterality: N/A;  umbilical  . INSERTION OF MESH N/A 05/16/2013   Procedure: INSERTION OF MESH;  Surgeon: Joyice Faster. Cornett, MD;  Location: [place];  Service: General;  Laterality: N/A;  umbilical  . JOINT REPLACEMENT  2007   right total knee replacement  . TOTAL KNEE ARTHROPLASTY  2010   left , then vocal cord infection post op  . VIDEO BRONCHOSCOPY Bilateral 07/05/2013   Procedure: VIDEO BRONCHOSCOPY WITH FLUORO;  Surgeon: Tanda Rockers, MD;  Location: [facility];  Service: Cardiopulmonary;  Laterality: Bilateral;  . vocal cord polypectomy      Current Outpatient Prescriptions  Medication Sig Dispense Refill  . amLODipine (NORVASC) 10 MG tablet TAKE 1 TABLET BY MOUTH DAILY. 90 tablet 1  . Ascorbic Acid (VITAMIN C) 1000 MG tablet Take 1,000 mg by mouth daily.      Marland Kitchen aspirin 81 MG tablet Take 81 mg by mouth daily.    . calcium-vitamin D (  OSCAL WITH D 500-200) 500-200 MG-UNIT per tablet Take 1 tablet by mouth daily.      . Cyanocobalamin (B-12 PO) Take 1 capsule by mouth daily.    Marland Kitchen EPIPEN 2-PAK 0.3 MG/0.3ML SOAJ injection INJECT 0.3 MLS (0.3 MG TOTAL) INTO THE MUSCLE ONCE AS NEEDED FOR ALLERGIC REACTION 2 Device 0  . esomeprazole (NEXIUM) 40 MG capsule Take 1 capsule (40 mg total) by mouth daily. 30 capsule 11  . furosemide (LASIX) 20 MG tablet Take 20 mg by mouth daily.     Marland Kitchen guaiFENesin-codeine (ROBITUSSIN AC) 100-10 MG/5ML syrup Take 5 mLs by mouth 3 (three) times daily as needed for cough.    . montelukast (SINGULAIR) 10 MG tablet Take 1 tablet (10 mg total) by mouth at bedtime. 30 tablet 11  . nitroGLYCERIN (NITROSTAT) 0.4 MG SL tablet Place 1 tablet (0.4 mg total) under the tongue every 5  (five) minutes as needed for chest pain. 25 tablet 6  . OXYGEN Inhale into the lungs. 2 liters QHS and also with exertion    . potassium chloride SA (K-DUR,KLOR-CON) 20 MEQ tablet Take 1 tablet (20 mEq total) by mouth daily. 30 tablet 11  . predniSONE (DELTASONE) 10 MG tablet 2 daily until better then 1 daily x 2 weeks then 2 on even and 1 on odd 100 tablet 2  . PROAIR HFA 108 (90 BASE) MCG/ACT inhaler INHALE 2 PUFFS BY MOUTH EVERY 4 HOURS AS NEEDED FOR WHEEZING OR FOR SHORTNESS OF BREATH 8 Inhaler 5  . ranitidine (ZANTAC) 150 MG tablet Take one pill by mouth twice daily    . Respiratory Therapy Supplies (FLUTTER) DEVI Use as directed 1 each 0  . triamcinolone (NASACORT) 55 MCG/ACT AERO nasal inhaler Place 2 sprays into the nose daily.    Marland Kitchen UNABLE TO FIND Med Name: b-12 injections per Dr. Glori Bickers    . ZETIA 10 MG tablet TAKE 1 TABLET (10 MG TOTAL) BY MOUTH DAILY. 30 tablet 11   No current facility-administered medications for this visit.     Allergies  Allergen Reactions  . Shellfish Allergy Anaphylaxis  . Aspirin     REACTION: nausea and vomiting High doses  . Atorvastatin     REACTION: leg pain  . Crestor [Rosuvastatin Calcium] Other (See Comments)    Muscle pain - allergy/intolerance  . Fish Allergy   . Penicillins     Due to mold allergy per pt  . Simvastatin     REACTION: muscle pain  . Trandolapril     REACTION: leg pain    Social History   Social History  . Marital status: Married    Spouse name: N/A  . Number of children: N/A  . Years of education: N/A   Occupational History  . Not on file.   Social History Main Topics  . Smoking status: Former Smoker    Packs/day: 1.00    Years: 25.00    Types: Cigarettes    Quit date: 07/27/1982  . Smokeless tobacco: Never Used  . Alcohol use 0.0 oz/week     Comment: wine-rare  . Drug use: No  . Sexual activity: Not on file   Other Topics Concern  . Not on file   Social History Narrative  . No narrative on file     Family History  Problem Relation Age of Onset  . Stroke Mother   . Stroke Father   . Colon cancer Neg Hx   . Breast cancer Maternal Grandmother   . Diabetes  Mother 40    Review of Systems:  As stated in the HPI and otherwise negative.   BP 122/74 (BP Location: Right Arm, Patient Position: Sitting, Cuff Size: Large)   Pulse 94   Ht 5\' 5"  (1.651 m)   Wt 247 lb (112 kg)   LMP 07/27/1980   SpO2 95%   BMI 41.10 kg/m   Physical Examination: General: Well developed, well nourished, NAD  HEENT: OP clear, mucus membranes moist  SKIN: warm, dry. No rashes. Neuro: No focal deficits  Musculoskeletal: Muscle strength 5/5 all ext  Psychiatric: Mood and affect normal  Neck: No JVD, no carotid bruits, no thyromegaly, no lymphadenopathy.  Lungs:Clear bilaterally, no wheezes, rhonci, crackles Cardiovascular: Regular rate and rhythm. No murmurs, gallops or rubs. Abdomen:Soft. Bowel sounds present. Non-tender.  Extremities: No lower extremity edema. Pulses are 2 + in the bilateral DP/PT.  Echo 12/18/15: Left ventricle: The cavity size was normal. There was moderate   concentric hypertrophy. Systolic function was normal. The   estimated ejection fraction was in the range of 50% to 55%.   Regional wall motion abnormalities cannot be excluded. It appears   that there is mild hypokinesis of the anteroseptal wall. Doppler   parameters are consistent with abnormal left ventricular   relaxation (grade 1 diastolic dysfunction). Doppler parameters   are consistent with indeterminate ventricular filling pressure. - Aortic valve: Transvalvular velocity was within the normal range.   There was no stenosis. There was no regurgitation. - Mitral valve: Transvalvular velocity was within the normal range.   There was no evidence for stenosis. There was no regurgitation. - Left atrium: The atrium was moderately dilated. - Right ventricle: The cavity size was normal. Wall thickness was   normal.  Systolic function was normal. - Tricuspid valve: There was no regurgitation. Impressions: - Endocardial border definition is poor. Regional wall motion   abnormalities cannot be excluded. It appears that there is mild   hypokinesis of the anteroseptal wall. Consider repeat echo with   contrast for more definitive assessment of LVEF and wall motion.  Echo Limited July 2017: Left ventricle: The cavity size was normal. There was mild   concentric hypertrophy. Systolic function was mildly reduced. The   estimated ejection fraction was in the range of 45% to 50%.   Hypokinesis of the inferolateral, inferior, and inferoseptal   myocardium. Acoustic contrast opacification revealed no evidence   ofthrombus. - Left atrium: The atrium was mildly dilated.  Nuclear stress test:   Nuclear stress EF: 48%. Mildly asynchronous contraction  There was no ST segment deviation noted during stress.  Defect 1: There is a small defect of mild severity present in the apex location. No ischemia identified  This is a low risk study. Sensitivity and specificity reduced by noted breast attenuation.    EKG:  EKG is not ordered today. The ekg ordered today demonstrates  Recent Labs: 12/16/2015: Brain Natriuretic Peptide 54.5 01/06/2016: ALT 14; BUN 17; Creatinine, Ser 0.77; Hemoglobin 11.4; Platelets 405.0; Potassium 4.0; Sodium 141; TSH 1.96   Lipid Panel    Component Value Date/Time   CHOL 166 01/06/2016 0855   TRIG 147.0 01/06/2016 0855   HDL 47.70 01/06/2016 0855   CHOLHDL 3 01/06/2016 0855   VLDL 29.4 01/06/2016 0855   LDLCALC 89 01/06/2016 0855   LDLDIRECT 124.8 09/01/2007 0909     Wt Readings from Last 3 Encounters:  02/19/16 247 lb (112 kg)  02/05/16 257 lb (116.6 kg)  02/04/16 230 lb (104.3 kg)  Other studies Reviewed: Additional studies/ records that were reviewed today include: . Review of the above records demonstrates:    Assessment and Plan:   1. CAD: Stable. Stress  myoview July 2017 with no ischemia. Will continue ASA and and Zetia. She is intolerant to statins. Beta blocker stopped by pulmonary secondary to possible bronchospasm.   2.  HYPERTENSION:  BP well controlled. No changes.     3. HLD: She is on Zetia. She does not tolerate statins. Lipids well controlled.   4. Chronic systolic and diastolic CHF: Mild LV dysfunction by echo last month. Volume status is improved. She has been using extra lasix as needed for weight gain/edema.    Current medicines are reviewed at length with the patient today.  The patient does not have concerns regarding medicines.  The following changes have been made:  no change  Labs/ tests ordered today include:   No orders of the defined types were placed in this encounter.   Disposition:   FU with me in 12  months  Signed, Lauree Chandler, MD 02/19/2016 12:41 PM    [address], [place], [place]  [address] Phone: [phone]; Fax: [phone]

## 2016-02-20 ENCOUNTER — Other Ambulatory Visit: Payer: Self-pay | Admitting: Family Medicine

## 2016-02-20 DIAGNOSIS — Z1231 Encounter for screening mammogram for malignant neoplasm of breast: Secondary | ICD-10-CM

## 2016-03-01 ENCOUNTER — Other Ambulatory Visit: Payer: Self-pay | Admitting: Family Medicine

## 2016-03-03 ENCOUNTER — Other Ambulatory Visit: Payer: Self-pay | Admitting: Family Medicine

## 2016-03-06 ENCOUNTER — Other Ambulatory Visit: Payer: Self-pay | Admitting: Family Medicine

## 2016-03-06 ENCOUNTER — Ambulatory Visit
Admission: RE | Admit: 2016-03-06 | Discharge: 2016-03-06 | Disposition: A | Discharge status: Home / Self Care | Payer: Medicare Other | Source: Ambulatory Visit | Attending: Family Medicine | Admitting: Family Medicine

## 2016-03-06 DIAGNOSIS — Z1231 Encounter for screening mammogram for malignant neoplasm of breast: Secondary | ICD-10-CM

## 2016-03-15 DIAGNOSIS — R0902 Hypoxemia: Secondary | ICD-10-CM | POA: Diagnosis not present

## 2016-03-19 DIAGNOSIS — H2513 Age-related nuclear cataract, bilateral: Secondary | ICD-10-CM | POA: Diagnosis not present

## 2016-04-15 DIAGNOSIS — R0902 Hypoxemia: Secondary | ICD-10-CM | POA: Diagnosis not present

## 2016-04-30 ENCOUNTER — Ambulatory Visit (INDEPENDENT_AMBULATORY_CARE_PROVIDER_SITE_OTHER): Payer: Medicare Other

## 2016-04-30 DIAGNOSIS — Z23 Encounter for immunization: Secondary | ICD-10-CM | POA: Diagnosis not present

## 2016-04-30 DIAGNOSIS — E538 Deficiency of other specified B group vitamins: Secondary | ICD-10-CM

## 2016-04-30 MED ORDER — CYANOCOBALAMIN 1000 MCG/ML IJ SOLN
1000.0000 ug | Freq: Once | INTRAMUSCULAR | Status: AC
  Administered 2016-04-30: 1000 ug via INTRAMUSCULAR

## 2016-05-07 ENCOUNTER — Ambulatory Visit (INDEPENDENT_AMBULATORY_CARE_PROVIDER_SITE_OTHER)
Admission: RE | Admit: 2016-05-07 | Discharge: 2016-05-07 | Disposition: A | Discharge status: Home / Self Care | Payer: Medicare Other | Source: Ambulatory Visit | Attending: Internal Medicine | Admitting: Internal Medicine

## 2016-05-07 ENCOUNTER — Encounter: Payer: Self-pay | Admitting: Internal Medicine

## 2016-05-07 ENCOUNTER — Other Ambulatory Visit (INDEPENDENT_AMBULATORY_CARE_PROVIDER_SITE_OTHER): Payer: Medicare Other

## 2016-05-07 ENCOUNTER — Ambulatory Visit (INDEPENDENT_AMBULATORY_CARE_PROVIDER_SITE_OTHER): Payer: Medicare Other | Admitting: Internal Medicine

## 2016-05-07 VITALS — BP 136/82 | HR 97 | Ht 65.5 in | Wt 258.0 lb

## 2016-05-07 DIAGNOSIS — R0600 Dyspnea, unspecified: Secondary | ICD-10-CM

## 2016-05-07 DIAGNOSIS — R918 Other nonspecific abnormal finding of lung field: Secondary | ICD-10-CM

## 2016-05-07 DIAGNOSIS — J9612 Chronic respiratory failure with hypercapnia: Secondary | ICD-10-CM

## 2016-05-07 DIAGNOSIS — J9611 Chronic respiratory failure with hypoxia: Secondary | ICD-10-CM

## 2016-05-07 DIAGNOSIS — I517 Cardiomegaly: Secondary | ICD-10-CM | POA: Diagnosis not present

## 2016-05-07 LAB — BASIC METABOLIC PANEL
BUN: 20 mg/dL (ref 6–23)
CALCIUM: 9.7 mg/dL (ref 8.4–10.5)
CO2: 39 meq/L — AB (ref 19–32)
Chloride: 98 mEq/L (ref 96–112)
Creatinine, Ser: 1.02 mg/dL (ref 0.40–1.20)
GFR: 55.97 mL/min — ABNORMAL LOW (ref >60.00)
Glucose, Bld: 147 mg/dL — ABNORMAL HIGH (ref 70–99)
POTASSIUM: 4.1 meq/L (ref 3.5–5.1)
SODIUM: 144 meq/L (ref 135–145)

## 2016-05-07 LAB — CBC WITH DIFFERENTIAL/PLATELET
BASOS ABS: 0.1 10*3/uL (ref 0.0–0.1)
BASOS PCT: 0.6 % (ref 0.0–3.0)
EOS ABS: 0.3 10*3/uL (ref 0.0–0.7)
Eosinophils Relative: 1.7 % (ref 0.0–5.0)
HEMATOCRIT: 43.2 % (ref 36.0–46.0)
HEMOGLOBIN: 14.4 g/dL (ref 12.0–15.0)
LYMPHS PCT: 9.6 % — AB (ref 12.0–46.0)
Lymphs Abs: 1.8 10*3/uL (ref 0.7–4.0)
MCHC: 33.4 g/dL (ref 30.0–36.0)
MCV: 92.1 fl (ref 78.0–100.0)
Monocytes Absolute: 0.9 10*3/uL (ref 0.1–1.0)
Monocytes Relative: 5 % (ref 3.0–12.0)
Neutro Abs: 15.4 10*3/uL — ABNORMAL HIGH (ref 1.4–7.7)
Neutrophils Relative %: 83.1 % — ABNORMAL HIGH (ref 43.0–77.0)
Platelets: 295 10*3/uL (ref 150.0–400.0)
RBC: 4.69 Mil/uL (ref 3.87–5.11)
RDW: 17.4 % — AB (ref 11.5–15.5)

## 2016-05-07 LAB — TSH: TSH: 1.74 u[IU]/mL (ref 0.35–4.50)

## 2016-05-07 LAB — SEDIMENTATION RATE: SED RATE: 17 mm/h (ref 0–30)

## 2016-05-07 LAB — BRAIN NATRIURETIC PEPTIDE: Pro B Natriuretic peptide (BNP): 69 pg/mL (ref 0.0–100.0)

## 2016-05-07 NOTE — Assessment & Plan Note (Signed)
No evidence of anemia/ chf/ thyroid dz > strongly suspect this is wt related (see separate a/p)

## 2016-05-07 NOTE — Progress Notes (Signed)
LMTCB

## 2016-05-07 NOTE — Assessment & Plan Note (Signed)
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 > discussed with IR, rec FOB > done 07/05/2013 > nl airways > neg afb/fungus/path  - IR Bx 07/11/2013 > inflammatory, special stains pending but does not look like tb or fungus and responds short course pred per pt > rec prednisone x 6 days only and needs VATS bx but declines as of 07/13/2013 > rec'd final path from [place] review > c/w BOOP though can't exclude asp/infection sources - 07/19/2013 notified pt and will return 07/24/13 for cxr/ esr then consider steroid rx (needs crypto serology also) - 07/24/2013 ESR 42 off pred x 3 weeks - 07/24/13 > crypto serology neg/ Anti-CCP neg  - started maint   prednisone 09/06/2013  >>> cxr cleared 10/18/2013 on 20 mg per day > rec taper to 20/10 - 11/28/13 reduced to 10 mg daily  - 03/07/2014 5 mg x 2 weeks then 5 mg qod x 2 weeks and stopped pred  04/10/14  - ESR 04/16/2015  = 24 / recurrent nodules > rec pred 20 mg daily > nodules resolved on f/u cxr 06/14/2015  - 04/30/2015 rec ceiling of 20 and floor of 10/5  - ESR 11/04/2015 =  23  On pred 10/5, rec taper off x 6 weeks if tol> flared in June 2017 so resumed last week in June 2017 20 ceiling/ 10/5 floor - ESR 05/07/2016   =  17 on prednisone 20 mg daily> rec 20/10   The goal with a chronic steroid dependent illness is always arriving at the lowest effective dose that controls the disease/symptoms and not accepting a set "formula" which is based on statistics or guidelines that don't always take into account patient  variability or the natural hx of the dz in every individual patient, which may well vary over time.  For now therefore I recommend the patient maintain  20/10 for now until sort out why she's more sob/ needing more 02 though suspect it's more wt related than BOOP based on exam/ esr/ cxr.   I had an extended discussion with the patient reviewing all relevant  studies completed to date and  lasting 15 to 20 minutes of a 25 minute visit    Each maintenance medication was reviewed in detail including most importantly the difference between maintenance and prns and under what circumstances the prns are to be triggered using an action plan format that is not reflected in the computer generated alphabetically organized AVS.    Please see instructions for details which were reviewed in writing and the patient given a copy highlighting the part that I personally wrote and discussed at today's ov.

## 2016-05-07 NOTE — Assessment & Plan Note (Signed)
Body mass index is 42.28  Lab Results  Component Value Date   TSH 1.74 05/07/2016     Contributing to gerd tendency/ doe/reviewed the need and the process to achieve and maintain neg calorie balance > defer f/u primary care including intermittently monitoring thyroid status    Referred to nutrition in [place] with food diary

## 2016-05-07 NOTE — Patient Instructions (Addendum)
Please see patient coordinator before you leave today  to schedule nutrition eval with food diary x 2 weeks  Please remember to go to the lab and x-ray department downstairs for your tests - we will call you with the results when they are available.  Reduce  The  Prednisone to 20 mg alternating with 10   Please schedule a follow up office visit in 6 weeks, call sooner if needed

## 2016-05-07 NOTE — Assessment & Plan Note (Signed)
11/24/2012 Office walk showed desats 86% on RA  With  spirometry FEV1 nml-73% , ratio 80   11/25/2012 CTA Chest >>neg for PE/ ILD  12/07/12 ONO > Pos 5: 31min  begin O2 2 l/m At bedtime  12/13/2012  >  Repeat 02/01/13 on 2lpm 55min> no change rx  - 01/31/2013  Walked RA x 3 laps @ 185 ft each stopped due to  End of study, no desats  - 07/03/2013 sats 75% RA >  On 3lpm sats 93%  > rx with 24 h 02 at 3lpm   - improved by self monitoring as of 07/24/13 ov so changed rx to 2lpm/24/7 humidified due to nasal dryness - 09/06/2013  Walked 2lpm x  2 laps @ 185 ft each stopped due to legs tired, no desats    - 04/18/2014  Walked RA  @ mod pace x 2 laps @ 185 ft each stopped due to  Hip pain, slow pace, sats 89% at end  - 05/16/2014  [facility] RA  2 laps @ 185 ft each stopped due to sob/fatigue but no desats off pred x 04/10/14  - 04/16/2015  Walked RA x 3 laps @ 185 ft each stopped due to   - ONO RA   10/05/2014 >  02 sat < 89% 213 min so rec resume 2lpm and work on wt loss   - 04/16/2015  Walked RA x 3 laps @ 185 ft each stopped due to  91% End of study, nl pace, no sob or desat - HCO3  01/06/16 = 35    - HCO3  05/07/2016  = 39 assoc with wt gain   As of  05/07/2016    02 2lpm at hs and prn to maintain > 90%     Strongly suspect worsening ohs from steroid induced wt gain > see sep a/p for both obesity and steroid dep BOOP

## 2016-05-07 NOTE — Progress Notes (Signed)
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 Follow-up    Pt had PET scan done this am. Her cough and SOB have improved since the last visit.   saba inhaler helps some/ min mucoid sputum, temp to 99 but no rigors, no purulent sputum or hemoptysis/ does have some wt. Loss, no sweating.  rec Dulera 100 Take 2 puffs first thing in am and then another 2 puffs about 12 hours later.  Prednisone 10 mg take  4 each am x 2 days,   2 each am x 2 days,  1 each am x 2 days and stop Only use your albuterol as a rescue medication  We will arrange 24/7 02 at 3lpm per advanced  We will call to arrange a biopsy of the easiest area once I discuss this with radiology  Late add: Discussed in detail all the  indications, usual  risks and alternatives  relative to the benefits with patient who agrees to proceed with bronchoscopy with biopsy.   FOB > done 07/05/2013 > nl airways > neg afb/fungus/path  - IR Bx 07/11/2013 > inflammatory, special stains pending but does not look like tb or fungus and responds short course pred per pt > rec prednisone x 6 days only and needs VATS bx but declines as of 07/13/2013 > rec'd final path from [facility] review > c/w BOOP though can't exclude asp/infection sources    11/04/2015  f/u ov/Wert re: BOOP/ cough variant asthma  10/5  Plus flovent 110/ singulair plus nexium ac and zantact hs Chief Complaint  Patient presents with  . Follow-up    Breathing is overall doing well. She has occ wheeze in the am. She is using rescue inhaler  2 x per wk on average.   cough/ rhinitis seasonal pattern much better on singulair per Dr Glori Bickers x 2 weeks   No  sob with adl's but not pushing herself much at this point  - waiting for the pool to open, limited by L hip  rec Try prednisone 5 mg daily x 2 week, then 5 mg every other day x 2 weeks, then pred 5 mg every third day x 2 weeks and stop At any point if worsen > go back to the previous dose that worked    02/04/2016  f/u ov/Wert re: BOOP/ chronic resp failure   Plus flovent 110/ singulair plus nexium ac and zantact hs Chief Complaint  Patient presents with  . Follow-up    Breathing has improved on 20 mg of pred- she increased from 10 mg 3 wks ago. She is not coughing much.   able to come off prednisone for 5 weeks, then gradually gradually worse sob / cough requiring 20 mg per day x 3 weeks and back to baseline next but quite hoarse ? From flovent and has seen cards with plan for stress test rec  Stop flovent and your hoarseness should improve Prednisone 10 mg take 2 daily until 100% back to normal  and then 1 x 2 weeks then one alternating with one half as you were before - at any point you get worse breathing or coughing then increase to previous dose -  02 2lpm at bedtime and as needed during the day to maintain sats over 90%     05/07/2016  f/u ov/Wert re:  BOOP/ chronic rf/ pred at 20 mg daily  Chief Complaint  Patient presents with  . Follow-up    Hoarsness is some better, comes and goes and worse in the am. Her sats have been lower and so she has been using o2 more during the day. She is having more SOB. Taking 20 mg of pred.       No obvious day to day or daytime variabilty or assoc excess/ purulent sputum or mucus plugs     cp or chest tightness, subjective wheeze overt sinus or hb symptoms. No unusual exp hx or h/o childhood pna/ asthma or knowledge of premature birth.  Sleeping ok without nocturnal  or early am exacerbation  of respiratory  c/o's or need for noct saba.  Also denies any obvious fluctuation of symptoms with weather or environmental changes or other aggravating or alleviating factors except as outlined above   Current Medications, Allergies, Complete Past Medical History, Past Surgical History, Family History, and Social History were reviewed in [employer] record.  ROS  The following are not active complaints unless bolded sore throat, dysphagia, dental problems, itching, sneezing,  nasal congestion or excess/ purulent secretions, ear ache,   fever, chills, sweats, unintended wt loss, pleuritic or exertional cp, hemoptysis,  orthopnea pnd or leg swelling, presyncope, palpitations, heartburn, abdominal pain, anorexia, nausea, vomiting, diarrhea  or change in bowel or urinary habits, change in stools or urine, dysuria,hematuria,  rash, arthralgias, visual complaints, headache, numbness weakness or ataxia or problems with walking or coordination,  change in mood/affect or memory.           PMH:  -Hx of NSTEMI.07/2009 w/  cardiac cath s/p 2 stents in LAD/ RCA       >>Echo at that time showed Mild LVH, EF of 55% and grade 1 diastolic dysfuntion  -Hyperlipidemia -HTN  -OA -s/p bilat. Knee replacements - Obesity    SH:  Retired from [employer] work Former smoker -quit 1984 -smoked x 20y x 1 PPD  Married  No pets  No unusual hobbies  [employer] on vacation frequently (1-10/2012 -traveled to [place], [place] and [place] )           Objective:   Physical Exam GEN: A/Ox3; pleasant , NAD, obese WF  / amb nad  cushingnoid   01/31/2013   237  > 07/03/2013  232 > 07/24/2013  231 > 09/06/2013  233 > 04/18/2014 256  10/18/2013  233 > 11/29/2013 238 >  03/07/2014  249 > 05/17/2014  263 > 06/26/2014 261>   10/01/2014  247 > 01/02/2015  253 >  04/16/2015 247 >  04/30/2015  244 > 06/14/2015   246 > 09/23/2015   255 > 11/04/2015 253  >  05/07/2016 259   Vital signs reviewed / note sats 95% on POC 2 lpm on arrival    HEENT:  [place]/AT,  EACs-clear, TMs-wnl,  NOSE-clear, THROAT-clear, no lesions, no postnasal drip or exudate noted.    Edentulous   NECK:  Supple w/ fair ROM; no JVD; normal carotid impulses w/o bruits; no thyromegaly or nodules palpated; no lymphadenopathy.    RESP    slt decreased  bs bilaterally, no insp crackles or exp rhonchi/ mild pseudowheeze   CARD:  RRR, no m/r/g -  Min bilateral lower ext pitting edema, pulses intact, no cyanosis or clubbing., neg homans sign   GI:   Soft & nt; nml bowel sounds; no organomegaly or masses detected.   Musco: Warm bil, no deformities or joint swelling noted.  Bilateral knee scars    CXR PA and Lateral:   05/07/2016 :    I personally reviewed images and agree with radiology impression as follows:    Cardiomegaly without evidence of acute cardiopulmonary disease.  No pulmonary nodules identified   Labs ordered/ reviewed:      Chemistry      Component Value Date/Time   NA 144 05/07/2016 1031   K 4.1 05/07/2016 1031   CL 98 05/07/2016 1031   CO2 39 (H) 05/07/2016 1031   BUN 20 05/07/2016 1031   CREATININE 1.02 05/07/2016 1031   CREATININE 1.01 (H) 12/16/2015 1142      Component Value Date/Time   CALCIUM 9.7 05/07/2016 1031   ALKPHOS 75 01/06/2016 0855   AST 14 01/06/2016 0855   ALT 14 01/06/2016 0855   BILITOT 0.6 01/06/2016 0855        Lab Results  Component Value Date   WBC 18.6 Repeated and verified X2. (Green) 05/07/2016   HGB 14.4 05/07/2016   HCT 43.2 05/07/2016   MCV 92.1 05/07/2016   PLT 295.0 05/07/2016      Lab Results  Component Value Date   TSH 1.74 05/07/2016     Lab Results  Component Value Date   PROBNP 69.0 05/07/2016       Lab Results  Component Value Date   ESRSEDRATE 17 05/07/2016   ESRSEDRATE 23 (H) 11/04/2015   ESRSEDRATE 24 (H) 04/16/2015                  Assessment & Plan:

## 2016-05-07 NOTE — Progress Notes (Signed)
Spoke with pt and notified of results per Dr. Wert. Pt verbalized understanding and denied any questions. 

## 2016-05-15 DIAGNOSIS — R0902 Hypoxemia: Secondary | ICD-10-CM | POA: Diagnosis not present

## 2016-06-01 ENCOUNTER — Ambulatory Visit: Payer: Medicare Other | Admitting: Dietician

## 2016-06-10 ENCOUNTER — Telehealth: Payer: Self-pay | Admitting: Internal Medicine

## 2016-06-10 NOTE — Telephone Encounter (Signed)
Will call in am on 11.15.17

## 2016-06-12 NOTE — Telephone Encounter (Signed)
I called [facility] & spoke to Adams.  She states she will call [facility] & take care of this.  Nothing further needed from [place].

## 2016-06-12 NOTE — Telephone Encounter (Signed)
Will route to [facility] to f/u on this.

## 2016-06-15 ENCOUNTER — Other Ambulatory Visit: Payer: Self-pay | Admitting: Internal Medicine

## 2016-06-15 ENCOUNTER — Ambulatory Visit: Payer: Medicare Other | Admitting: Dietician

## 2016-06-15 DIAGNOSIS — R0902 Hypoxemia: Secondary | ICD-10-CM | POA: Diagnosis not present

## 2016-06-17 ENCOUNTER — Telehealth: Payer: Self-pay | Admitting: Internal Medicine

## 2016-06-17 ENCOUNTER — Other Ambulatory Visit: Payer: Self-pay

## 2016-06-17 MED ORDER — GUAIFENESIN-CODEINE 100-10 MG/5ML PO SYRP: 5.0000 mL | ORAL_SOLUTION | Freq: Three times a day (TID) | ORAL | 0 refills | Status: DC | PRN

## 2016-06-17 NOTE — Telephone Encounter (Signed)
Spoke with husband and gave them MW recc. The pt. rx was called in to [facility]. They had no further questions. Nothing further needed at this time.

## 2016-06-17 NOTE — Telephone Encounter (Signed)
Pt states her son recently had a son and she has caught it. Pt c/o dry cough at times prod with thick white mucus, mild wheezing, sob & chest discomfort X2d Pt has been taking mucinex & delsym with no improvement.  Pt has an upcoming appt with MW on 06/22/16 but id requesting cough medication to get her by.  MW please advise. Thanks.   Please see patient coordinator before you leave today  to schedule nutrition eval with food diary x 2 weeks  Please remember to go to the lab and x-ray department downstairs for your tests - we will call you with the results when they are available.  Reduce  The  Prednisone to 20 mg alternating with 10   Please schedule a follow up office visit in 6 weeks, call sooner if needed

## 2016-06-17 NOTE — Telephone Encounter (Signed)
Ok to refill the rob ac/ also double the prednisone until better then back to prev dose

## 2016-06-22 ENCOUNTER — Ambulatory Visit: Payer: Medicare Other | Admitting: Internal Medicine

## 2016-06-22 ENCOUNTER — Telehealth: Payer: Self-pay | Admitting: Internal Medicine

## 2016-06-22 NOTE — Telephone Encounter (Signed)
Ov this week with all meds in hand, me or Tammy NP

## 2016-06-22 NOTE — Telephone Encounter (Signed)
Spoke with pt's husband. He is aware of MW's response. Pt has been scheduled to see MW tomorrow at 1:45pm. Nothing further was needed.

## 2016-06-22 NOTE — Telephone Encounter (Signed)
If she's doing so badly she can't get to office then she definitely needs to be seen and not treated over the phone > rec go to ER

## 2016-06-22 NOTE — Telephone Encounter (Signed)
Husband Kelli Velasquez states that the patient is coughing severely and is unable to sleep so is very weak.  Husband states that he does not feel that she can make it to an appt this week d/t being so weak.  Patient is only getting 1-2 hours of sleep a night and as a result is very fatigued and weak.  Patient could be heard coughing during our whole conversation.  John states the OTC cough syrups are not helping to give any relief and she needs something prescribed to help stop this cough cycle. Pt husband aware that there is not much that can be done over the phone and an appt is necessary at this point - John requests that something be called in as they cannot make it in the office this week d/t the patient's current condition. Please advise Dr Melvyn Novas. Thanks.

## 2016-06-22 NOTE — Telephone Encounter (Signed)
Spoke with pt's husband. States that the pt is not feeling any better since last week. Still reports cough, wheezing, SOB and chest discomfort. Cough is producing white mucus. Last week we gave her a prescription for Robitussin [facility] and this is not working. They want a "heavy duty" cough syrup.  MW - please advise. Thanks.

## 2016-06-23 ENCOUNTER — Ambulatory Visit (INDEPENDENT_AMBULATORY_CARE_PROVIDER_SITE_OTHER)
Admission: RE | Admit: 2016-06-23 | Discharge: 2016-06-23 | Disposition: A | Discharge status: Home / Self Care | Payer: Medicare Other | Source: Ambulatory Visit | Attending: Internal Medicine | Admitting: Internal Medicine

## 2016-06-23 ENCOUNTER — Encounter: Payer: Self-pay | Admitting: Internal Medicine

## 2016-06-23 ENCOUNTER — Ambulatory Visit (INDEPENDENT_AMBULATORY_CARE_PROVIDER_SITE_OTHER): Payer: Medicare Other | Admitting: Internal Medicine

## 2016-06-23 VITALS — BP 132/86 | HR 96 | Ht 65.5 in | Wt 265.0 lb

## 2016-06-23 DIAGNOSIS — R918 Other nonspecific abnormal finding of lung field: Secondary | ICD-10-CM

## 2016-06-23 DIAGNOSIS — J45991 Cough variant asthma: Secondary | ICD-10-CM | POA: Diagnosis not present

## 2016-06-23 DIAGNOSIS — R05 Cough: Secondary | ICD-10-CM | POA: Diagnosis not present

## 2016-06-23 MED ORDER — ALBUTEROL SULFATE (2.5 MG/3ML) 0.083% IN NEBU
2.5000 mg | INHALATION_SOLUTION | Freq: Once | RESPIRATORY_TRACT | Status: AC
  Administered 2016-06-23: 2.5 mg via RESPIRATORY_TRACT

## 2016-06-23 MED ORDER — CEFDINIR 300 MG PO CAPS: 300.0000 mg | ORAL_CAPSULE | Freq: Two times a day (BID) | ORAL | 0 refills | Status: DC

## 2016-06-23 MED ORDER — ACETAMINOPHEN-CODEINE #3 300-30 MG PO TABS: ORAL_TABLET | ORAL | 0 refills | Status: DC

## 2016-06-23 NOTE — Progress Notes (Signed)
Spoke with pt and notified of results per Dr. Wert. Pt verbalized understanding and denied any questions. 

## 2016-06-23 NOTE — Patient Instructions (Addendum)
omnicef 300 mg one twice daily x 10 days   For cough mucinex dm 1200 mg  X every 12 hours and use flutter valve as much as possible and add tylenol #3 as needed up to every 4 hours if can't stop the cough otherwise   Prednisone 20 mg daily until return  Continue nexium 40 mg Take 30-60 min before first meal of the day and increase  zantac to 300 mg at bedtime   GERD (REFLUX)  is an extremely common cause of respiratory symptoms just like yours , many times with no obvious heartburn at all.    It can be treated with medication, but also with lifestyle changes including elevation of the head of your bed (ideally with 6 inch  bed blocks),  Smoking cessation, avoidance of late meals, excessive alcohol, and avoid fatty foods, chocolate, peppermint, colas, red wine, and acidic juices such as orange juice.  NO MINT OR MENTHOL PRODUCTS SO NO COUGH DROPS   USE SUGARLESS CANDY INSTEAD (Jolley ranchers or Stover's or Life Savers) or even ice chips will also do - the key is to swallow to prevent all throat clearing. NO OIL BASED VITAMINS - use powdered substitutes.   Only use your albuterol (proair) as a rescue medication to be used if you can't catch your breath by resting or doing a relaxed purse lip breathing pattern.  - The less you use it, the better it will work when you need it. - Ok to use up to 2 puffs  every 4 hours if you must but call for immediate appointment if use goes up over your usual need - Don't leave home without it !!  (think of it like the spare tire for your car)   See Tammy NP w/in 2 weeks with all your medications, even over the counter meds, separated in two separate bags, the ones you take no matter what vs the ones you stop once you feel better and take only as needed when you feel you need them.   Tammy  will generate for you a new user friendly medication calendar that will put [place] all on the same page re: your medication use.     Without this process, it simply isn't  possible to assure that we are providing  your outpatient care  with  the attention to detail we feel you deserve.   If we cannot assure that you're getting that kind of care,  then we cannot manage your problem effectively from this clinic.  Once you have seen Tammy and we are sure that we're all on the same page with your medication use she will arrange follow up with me.     Please remember to go to the x-ray department downstairs for your tests - we will call you with the results when they are available.

## 2016-06-23 NOTE — Progress Notes (Signed)
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 Follow-up    Pt had PET scan done this am. Her cough and SOB have improved since the last visit.   saba inhaler helps some/ min mucoid sputum, temp to 99 but no rigors, no purulent sputum or hemoptysis/ does have some wt. Loss, no sweating.  rec Dulera 100 Take 2 puffs first thing in am and then another 2 puffs about 12 hours later.  Prednisone 10 mg take  4 each am x 2 days,   2 each am x 2 days,  1 each am x 2 days and stop Only use your albuterol as a rescue medication  We will arrange 24/7 02 at 3lpm per advanced  We will call to arrange a biopsy of the easiest area once I discuss this with radiology  Late add: Discussed in detail all the  indications, usual  risks and alternatives  relative to the benefits with patient who agrees to proceed with bronchoscopy with biopsy.   FOB > done 07/05/2013 > nl airways > neg afb/fungus/path  - IR Bx 07/11/2013 > inflammatory, special stains pending but does not look like tb or fungus and responds short course pred per pt > rec prednisone x 6 days only and needs VATS bx but declines as of 07/13/2013 > rec'd final path from [facility] review > c/w BOOP though can't exclude asp/infection sources    11/04/2015  f/u ov/Vaughn Frieze re: BOOP/ cough variant asthma  10/5  Plus flovent 110/ singulair plus nexium ac and zantact hs Chief Complaint  Patient presents with  . Follow-up    Breathing is overall doing well. She has occ wheeze in the am. She is using rescue inhaler  2 x per wk on average.   cough/ rhinitis seasonal pattern much better on singulair per Dr Glori Bickers x 2 weeks   No  sob with adl's but not pushing herself much at this point  - waiting for the pool to open, limited by L hip  rec Try prednisone 5 mg daily x 2 week, then 5 mg every other day x 2 weeks, then pred 5 mg every third day x 2 weeks and stop At any point if worsen > go back to the previous dose that worked    02/04/2016  f/u ov/Vanda Waskey re: BOOP/ chronic resp failure   Plus flovent 110/ singulair plus nexium ac and zantact hs Chief Complaint  Patient presents with  . Follow-up    Breathing has improved on 20 mg of pred- she increased from 10 mg 3 wks ago. She is not coughing much.   able to come off prednisone for 5 weeks, then gradually gradually worse sob / cough requiring 20 mg per day x 3 weeks and back to baseline next but quite hoarse ? From flovent and has seen cards with plan for stress test rec  Stop flovent and your hoarseness should improve Prednisone 10 mg take 2 daily until 100% back to normal  and then 1 x 2 weeks then one alternating with one half as you were before - at any point you get worse breathing or coughing then increase to previous dose -  02 2lpm at bedtime and as needed during the day to maintain sats over 90%     05/07/2016  f/u ov/Maryanne Huneycutt re:  BOOP/ chronic rf/ pred at 20 mg daily  Chief Complaint  Patient presents with  . Follow-up    Hoarsness is some better, comes and goes and worse in the am. Her sats have been lower and so she has been using o2 more during the day. She is having more SOB. Taking 20 mg of pred.    rec Please see patient coordinator before you leave today  to schedule nutrition eval with food diary x 2 weeks Please remember to go to the lab and x-ray department downstairs for your tests - we will call you with the results when they are available. Reduce  The  Prednisone to 20 mg alternating with 10   Starting around May 27 2016 10 x 3 week  then 10/5 but in meantime "caught cold" from son onset 06/08/16  and after 10/5 x a few days  then back to 20/10    06/23/2016 acute extended ov/Chelesa Weingartner re:  Chief Complaint  Patient presents with  . Follow-up    Increased cough since 06/08/16- prod with clear sputum.  pred rx as above = 20/10 Some sweats and fever since Jun 08 2016 but no purulent sputum or mucus plugs   Some better with saba   No obvious day to day or daytime variabilty or assoc   cp or chest tightness, subjective wheeze overt sinus or hb symptoms. No unusual exp hx or h/o childhood pna/ asthma or knowledge of premature birth.   . Also denies any obvious fluctuation of symptoms with weather or environmental changes or other aggravating or alleviating factors except as outlined above   Current Medications, Allergies, Complete Past Medical History, Past Surgical History, Family History, and Social History were reviewed in [employer] [facility] record.  ROS  The following are not active complaints unless bolded sore throat, dysphagia, dental problems, itching, sneezing,  nasal congestion or excess/ purulent secretions, ear ache,   fever, chills, sweats, unintended wt loss, pleuritic or exertional cp, hemoptysis,  orthopnea pnd or leg swelling, presyncope, palpitations, heartburn, abdominal pain, anorexia, nausea, vomiting, diarrhea  or change in bowel or urinary habits, change in stools or urine, dysuria,hematuria,  rash, arthralgias, visual complaints, headache, numbness weakness or ataxia or problems with walking or coordination,  change in mood/affect or memory.           PMH:  -Hx of NSTEMI.07/2009 w/  cardiac cath s/p 2 stents in LAD/ RCA       >>Echo at that time showed Mild LVH, EF of 55% and grade 1 diastolic dysfuntion  -Hyperlipidemia -HTN  -OA -s/p bilat. Knee replacements - Obesity    SH:  Retired from [employer] work Former smoker -quit 1984 -smoked x 20y x 1 PPD  Married  No pets  No unusual  hobbies  [employer] on vacation frequently (1-10/2012 -traveled to [place], [place] and [place] )           Objective:   Physical Exam GEN: A/Ox3; pleasant , NAD, obese WF  / amb nad  cushingnoid   01/31/2013   237  > 07/03/2013  232 > 07/24/2013  231 > 09/06/2013  233 > 04/18/2014 256  10/18/2013  233 > 11/29/2013 238 >  03/07/2014  249 > 05/17/2014  263 > 06/26/2014 261>   10/01/2014  247 > 01/02/2015  253 >  04/16/2015 247 >  04/30/2015  244 > 06/14/2015   246 > 09/23/2015   255 > 11/04/2015 253  >  05/07/2016 259 > 06/23/2016   265   Vital signs reviewed - Note on arrival 02 sats  92% on RA    HEENT:  [place]/AT,  EACs-clear, TMs-wnl, NOSE-clear, THROAT-clear, no lesions, no postnasal drip or exudate noted.    Edentulous   NECK:  Supple w/ fair ROM; no JVD; normal carotid impulses w/o bruits; no thyromegaly or nodules palpated; no lymphadenopathy.    RESP    slt decreased bs bilaterally, no insp crackles or exp rhonchi/ mild pseudowheeze   CARD:  RRR, no m/r/g -  Very min bilateral lower ext pitting edema, pulses intact, no cyanosis or clubbing., neg homans sign   GI:   Soft & nt; nml bowel sounds; no organomegaly or masses detected.   Musco: Warm bil, no deformities or joint swelling noted.  Bilateral knee scars     CXR PA and Lateral:   06/23/2016 :    I personally reviewed images and agree with radiology impression as follows:    Cardiomegaly without decompensation. Bibasilar atelectasis.        Assessment & Plan:

## 2016-06-24 ENCOUNTER — Encounter: Payer: Self-pay | Admitting: Internal Medicine

## 2016-06-24 NOTE — Assessment & Plan Note (Signed)
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 > discussed with IR, rec FOB > done 07/05/2013 > nl airways > neg afb/fungus/path  - IR Bx 07/11/2013 > inflammatory, special stains pending but does not look like tb or fungus and responds short course pred per pt > rec prednisone x 6 days only and needs VATS bx but declines as of 07/13/2013 > rec'd final path from [facility] review > c/w BOOP though can't exclude asp/infection sources - 07/19/2013 notified pt and will return 07/24/13 for cxr/ esr then consider steroid rx (needs crypto serology also) - 07/24/2013 ESR 42 off pred x 3 weeks - 07/24/13 > crypto serology neg/ Anti-CCP neg  - started maint   prednisone 09/06/2013  >>> cxr cleared 10/18/2013 on 20 mg per day > rec taper to 20/10 - 11/28/13 reduced to 10 mg daily  - 03/07/2014 5 mg x 2 weeks then 5 mg qod x 2 weeks and stopped pred  04/10/14  - ESR 04/16/2015  = 24 / recurrent nodules > rec pred 20 mg daily > nodules resolved on f/u cxr 06/14/2015  - 04/30/2015 rec ceiling of 20 and floor of 10/5  - ESR 11/04/2015 =  23  On pred 10/5, rec taper off x 6 weeks if tol> flared in June 2017 so resumed last week in June 2017 20 ceiling/ 10/5 floor - ESR 05/07/2016   =  17 on prednisone 20 mg daily> rec 20/10    Not clear whether the drop in pred rx (which I did not rec) or due to URI but exam is clear and sats are fine  For now until sort out rec pred 20 mg daily until better then try again on 20/10

## 2016-06-24 NOTE — Assessment & Plan Note (Signed)
Body mass index is 43.43 >  Still trending up ? Related to Pred Lab Results  Component Value Date   TSH 1.74 05/07/2016     Contributing to gerd tendency/ doe/reviewed the need and the process to achieve and maintain neg calorie balance > defer f/u primary care including intermittently monitoring thyroid status

## 2016-06-24 NOTE — Assessment & Plan Note (Signed)
-   PFT's 01/31/2013 wnl x low fef25-75 and 12% better fev1 p B2 - 06/26/2014 p extensive coaching HFA effectiveness =    75% . Start qvar 80 2bid  - trial of qvar 80 1 bid 10/01/14 > improved 01/02/15 > flared on low dose qvar 02/2015 > back on qvar 80 2bid rec  04/16/2015 > improved 04/30/2015  - added flutter 09/23/2015 >>>   - Singulair rx 10/23/15 def decrease in albuterol need/ decrease wheeze and improved sob > no better on flovent 02/04/2016 so d/c'd flovent  Cough presently has more of a UACS component but is some better p neb so as per instructions on AVS REC:  omnicef 300 mg one twice daily x 10 days to cover sinus dz/ bacterial tracheobronchitis  For cough mucinex dm 1200 mg  X every 12 hours and use flutter valve as much as possible and add tylenol #3 as needed up to every 4 hours if can't stop the cough otherwise  To eliminate cyclical coughing   Prednisone 20 mg daily until return in 2 weeks   Continue nexium 40 mg Take 30-60 min before first meal of the day and increase  zantac to 300 mg at bedtime   GERD (REFLUX)  is an extremely common cause of respiratory symptoms just like yours , many times with no obvious heartburn at all.    It can be treated with medication, but also with lifestyle changes including elevation of the head of your bed (ideally with 6 inch  bed blocks),  Smoking cessation, avoidance of late meals, excessive alcohol, and avoid fatty foods, chocolate, peppermint, colas, red wine, and acidic juices such as orange juice.  NO MINT OR MENTHOL PRODUCTS SO NO COUGH DROPS   USE SUGARLESS CANDY INSTEAD (Jolley ranchers or Stover's or Life Savers) or even ice chips will also do - the key is to swallow to prevent all throat clearing. NO OIL BASED VITAMINS - use powdered substitutes.   Only use your albuterol (proair) as a rescue medication to be used if you can't catch your breath by resting or doing a relaxed purse lip breathing pattern.  - The less you use it, the better it will  work when you need it. - Ok to use up to 2 puffs  every 4 hours if you must    I had an extended discussion with the patient reviewing all relevant studies completed to date and  lasting 25 minutes of a 40  minute acute  visit  re  non-specific but potentially very serious pulmonary symptoms of unknown etiology.  Each maintenance medication was reviewed in detail including most importantly the difference between maintenance and prns and under what circumstances the prns are to be triggered using an action plan format that is not reflected in the computer generated alphabetically organized AVS.    Please see AVS for unique instructions that I personally wrote and verbalized to the the pt in detail and then reviewed with pt  by my nurse highlighting any  changes in therapy recommended at today's visit to their plan of care.

## 2016-06-26 ENCOUNTER — Other Ambulatory Visit: Payer: Self-pay | Admitting: Family Medicine

## 2016-06-29 ENCOUNTER — Ambulatory Visit: Payer: Medicare Other | Admitting: Dietician

## 2016-07-01 ENCOUNTER — Telehealth: Payer: Self-pay

## 2016-07-01 MED ORDER — CLOTRIMAZOLE-BETAMETHASONE 1-0.05 % EX CREA: 1.0000 "application " | TOPICAL_CREAM | Freq: Two times a day (BID) | CUTANEOUS | 1 refills | Status: DC

## 2016-07-01 NOTE — Telephone Encounter (Signed)
I sent it to her pharmacy  F/u if no improvement

## 2016-07-01 NOTE — Telephone Encounter (Signed)
Pt left v/m requesting clotrimazole betamethasone for recurrent fungal infection under breast. Pt said has not had cream for long time. [facility].Please advise. Pt last seen annual exam on 01/08/16.

## 2016-07-02 NOTE — Telephone Encounter (Signed)
Pt notified Rx sent and f/u if no improvement

## 2016-07-07 ENCOUNTER — Encounter: Payer: Self-pay | Admitting: Adult Health

## 2016-07-07 ENCOUNTER — Ambulatory Visit (INDEPENDENT_AMBULATORY_CARE_PROVIDER_SITE_OTHER): Payer: Medicare Other | Admitting: Adult Health

## 2016-07-07 ENCOUNTER — Ambulatory Visit: Payer: Medicare Other | Admitting: Internal Medicine

## 2016-07-07 DIAGNOSIS — J9611 Chronic respiratory failure with hypoxia: Secondary | ICD-10-CM

## 2016-07-07 DIAGNOSIS — J3089 Other allergic rhinitis: Secondary | ICD-10-CM

## 2016-07-07 DIAGNOSIS — K219 Gastro-esophageal reflux disease without esophagitis: Secondary | ICD-10-CM

## 2016-07-07 DIAGNOSIS — J45991 Cough variant asthma: Secondary | ICD-10-CM

## 2016-07-07 DIAGNOSIS — J9612 Chronic respiratory failure with hypercapnia: Secondary | ICD-10-CM

## 2016-07-07 NOTE — Progress Notes (Signed)
Chart and office note reviewed in detail  > agree with a/p as outlined    

## 2016-07-07 NOTE — Assessment & Plan Note (Signed)
Controlled on rx   

## 2016-07-07 NOTE — Patient Instructions (Signed)
Follow med calendar closely and bring to each visit.  Decrease prednisone 20mg  alternating with 10mg  daily  Follow up Dr. Melvyn Novas  In 6 -8 weeks and As needed

## 2016-07-07 NOTE — Assessment & Plan Note (Signed)
Controled on o2  Cont on O2

## 2016-07-07 NOTE — Assessment & Plan Note (Signed)
Recent flare now improved  Cont current regimen  Plan  Patient Instructions  Follow med calendar closely and bring to each visit.  Decrease prednisone 20mg  alternating with 10mg  daily  Follow up Dr. Melvyn Novas  In 6 -8 weeks and As needed

## 2016-07-07 NOTE — Assessment & Plan Note (Signed)
Recent flare now resolving   Plan  Cont on current regimen   

## 2016-07-07 NOTE — Progress Notes (Signed)
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 Follow-up    Pt had PET scan done this am. Her cough and SOB have improved since the last visit.   saba inhaler helps some/ min mucoid sputum, temp to 99 but no rigors, no purulent sputum or hemoptysis/ does have some wt. Loss, no sweating.  rec Dulera 100 Take 2 puffs first thing in am and then another 2 puffs about 12 hours later.  Prednisone 10 mg take  4 each am x 2 days,   2 each am x 2 days,  1 each am x 2 days and stop Only use your albuterol as a rescue medication  We will arrange 24/7 02 at 3lpm per advanced  We will call to arrange a biopsy of the easiest area once I discuss this with radiology  Late add: Discussed in detail all the  indications, usual  risks and alternatives  relative to the benefits with patient who agrees to proceed with bronchoscopy with biopsy.   FOB > done 07/05/2013 > nl airways > neg afb/fungus/path  - IR Bx 07/11/2013 > inflammatory, special stains pending but does not look like tb or fungus and responds short course pred per pt > rec prednisone x 6 days only and needs VATS bx but declines as of 07/13/2013 > rec'd final path from [facility] review > c/w BOOP though can't exclude asp/infection sources    11/04/2015  f/u ov/Wert re: BOOP/ cough variant asthma  10/5  Plus flovent 110/ singulair plus nexium ac and zantact hs Chief Complaint  Patient presents with  . Follow-up    Breathing is overall doing well. She has occ wheeze in the am. She is using rescue inhaler  2 x per wk on average.   cough/ rhinitis seasonal pattern much better on singulair per Dr Glori Bickers x 2 weeks   No  sob with adl's but not pushing herself much at this point  - waiting for the pool to open, limited by L hip  rec Try prednisone 5 mg daily x 2 week, then 5 mg every other day x 2 weeks, then pred 5 mg every third day x 2 weeks and stop At any point if worsen > go back to the previous dose that worked    02/04/2016  f/u ov/Wert re: BOOP/ chronic resp failure   Plus flovent 110/ singulair plus nexium ac and zantact hs Chief Complaint  Patient presents with  . Follow-up    Breathing has improved on 20 mg of pred- she increased from 10 mg 3 wks ago. She is not coughing much.   able to come off prednisone for 5 weeks, then gradually gradually worse sob / cough requiring 20 mg per day x 3 weeks and back to baseline next but quite hoarse ? From flovent and has seen cards with plan for stress test rec  Stop flovent and your hoarseness should improve Prednisone 10 mg take 2 daily until 100% back to normal  and then 1 x 2 weeks then one alternating with one half as you were before - at any point you get worse breathing or coughing then increase to previous dose -  02 2lpm at bedtime and as needed during the day to maintain sats over 90%     05/07/2016  f/u ov/Wert re:  BOOP/ chronic rf/ pred at 20 mg daily  Chief Complaint  Patient presents with  . Follow-up    Hoarsness is some better, comes and goes and worse in the am. Her sats have been lower and so she has been using o2 more during the day. She is having more SOB. Taking 20 mg of pred.    rec Please see patient coordinator before you leave today  to schedule nutrition eval with food diary x 2 weeks Please remember to go to the lab and x-ray department downstairs for your tests - we will call you with the results when they are available. Reduce  The  Prednisone to 20 mg alternating with 10   Starting around May 27 2016 10 x 3 week  then 10/5 but in meantime "caught cold" from son onset 06/08/16  and after 10/5 x a few days  then back to 20/10    06/23/2016 acute extended ov/Wert re:  Chief Complaint  Patient presents with  . Follow-up    Increased cough since 06/08/16- prod with clear sputum.  pred rx as above = 20/10 Some sweats and fever since Jun 08 2016 but no purulent sputum or mucus plugs  Some better with saba  >>Omnicef /increased pred 10m    07/07/2016 Follow up : Pulmonary nodules c/w BOOP /Asthma/Bronchitis /AR  Pt returns for 2 week follow up for BOOP , Cough Variant Asthma  and Med review   We reviewed all his meds and organized them into a med calendar with pt education. Appears to be taking correctly with pt education .   Pt has underwent an extensive workup for lung nodularity w/ VATS path c/w BOOP. 2014. tx w/ Steroids on/off w/ restart of prednisone 03/2015 with baseline prednisone dose at alternating 117mand 13m58mntil 11/28 /17 when  this was increased to 20 mg daily . ESR has remained low . Last ov steroids were increased to 19m72mCough is better. Steroid pt education given    She has chronic rhinitis with flare last ov , seems better now . Maintains on chlortabs and singuliar . Cough is felt secondary to GERD /AR . GERD in controlled on PPI/Zantac  Tx for bronchitis and possible sinusitis last ov . She is feeling better with decreased cough and dyspnea. Congestion is improved.   Remains on o2 w/ good o2 sats.       Current Medications, Allergies, Complete Past Medical History, Past Surgical History, Family History, and Social History were reviewed in [employer].  ROS  The following are not active complaints unless bolded sore throat, dysphagia, dental problems, itching, sneezing,  nasal congestion or excess/ purulent secretions, ear ache,   fever, chills, sweats, unintended wt loss, pleuritic or exertional cp, hemoptysis,  orthopnea pnd or leg swelling,  presyncope, palpitations, heartburn, abdominal pain, anorexia, nausea, vomiting, diarrhea  or change in bowel or urinary habits, change in stools or urine, dysuria,hematuria,  rash, arthralgias, visual complaints, headache, numbness weakness or ataxia or problems with walking or coordination,  change in mood/affect or memory.           PMH:  -Hx of  NSTEMI.07/2009 w/  cardiac cath s/p 2 stents in LAD/ RCA       >>Echo at that time showed Mild LVH, EF of 55% and grade 1 diastolic dysfuntion  -Hyperlipidemia -HTN  -OA -s/p bilat. Knee replacements - Obesity    SH:  Retired from [employer] work Former smoker -quit 1984 -smoked x 20y x 1 PPD  Married  No pets  No unusual hobbies  [place] on vacation frequently (1-10/2012 -traveled to [place], [place] and [place] )           Objective:   Physical Exam 01/31/2013   237  > 07/03/2013  232 > 07/24/2013  231 > 09/06/2013  233 > 04/18/2014 256  10/18/2013  233 > 11/29/2013 238 >  03/07/2014  249 > 05/17/2014  263 > 06/26/2014 261>   10/01/2014  247 > 01/02/2015  253 >  04/16/2015 247 >  04/30/2015  244 > 06/14/2015   246 > 09/23/2015   255 > 11/04/2015 253  >  05/07/2016 259 > 06/23/2016   265    GEN: A/Ox3; pleasant , NAD, obese     HEENT:  [place]/AT,  EACs-clear, TMs-wnl, NOSE-clear drainage  THROAT-clear, no lesions, no postnasal drip or exudate noted.   NECK:  Supple w/ fair ROM; no JVD; normal carotid impulses w/o bruits; no thyromegaly or nodules palpated; no lymphadenopathy.    RESP  Clear  P & A; w/o, wheezes/ rales/ or rhonchi. no accessory muscle use, no dullness to percussion  CARD:  RRR, no m/r/g  , 1-2 +  peripheral edema, pulses intact, no cyanosis or clubbing.  GI:   Soft & nt; nml bowel sounds; no organomegaly or masses detected.   Musco: Warm bil, no deformities or joint swelling noted.   Neuro: alert, no focal deficits noted.    Skin: Warm, no lesions or rashes     CXR PA and Lateral:   06/23/2016 :      Cardiomegaly without  decompensation. Bibasilar atelectasis.    Tammy Parrett NP-C  [facility]  07/07/2016

## 2016-07-13 ENCOUNTER — Encounter: Payer: Medicare Other | Attending: Family Medicine | Admitting: Dietician

## 2016-07-13 VITALS — Wt 261.7 lb

## 2016-07-13 DIAGNOSIS — Z713 Dietary counseling and surveillance: Secondary | ICD-10-CM | POA: Diagnosis not present

## 2016-07-13 DIAGNOSIS — IMO0001 Reserved for inherently not codable concepts without codable children: Secondary | ICD-10-CM

## 2016-07-13 DIAGNOSIS — E669 Obesity, unspecified: Secondary | ICD-10-CM | POA: Diagnosis present

## 2016-07-13 DIAGNOSIS — Z6841 Body Mass Index (BMI) 40.0 and over, adult: Secondary | ICD-10-CM | POA: Diagnosis not present

## 2016-07-13 NOTE — Progress Notes (Signed)
Medical Nutrition Therapy: Visit start time: [id number]  end time: 1715  Assessment:  Diagnosis: obesity Past medical history: hyperlipidemia, HTN, GERD, respiratory disease Psychosocial issues/ stress concerns: none Preferred learning method:  . Visual  Current weight: 261.7lbs  Height: 5'5.5" Medications, supplements: reviewed list in chart with patient  Progress and evaluation: Patient reports weight gain of 30-35lbs since starting Prednisone several months ago; reports overweight and weight fluctuation since 40s. Has been able to keep weight off for up to 10 years, then gradually regains. 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 She hopes that weight loss now will help with breathing and digestive health.   Physical activity: none, due to recent illness, oxygen treatment  Dietary Intake:  Usual eating pattern includes 3 meals and 1-2 snacks per day. Dining out frequency: 5-7 meals per week.  Breakfast: 8am cold cuts, sometimes cheese, eggs, sandwich, or cereal Snack: none, usually busy Lunch: usually out, 12-1pm--variety of restaurants, no fast food Snack: fruit: watermelon, grapes, popcorn (air popped, no butter), cashews. Supper: sandwich, soup with crackers, hot dog, hamburger, salad. Quick meal.  Snack: cashew-milk dairy-free ice cream (1/2 cup = 120kcal), sometimes snack-bag of chips.  Beverages: mostly water, seltzer or tonic water, decaf iced tea, regular tea unsweetened with splenda in restaurants.   Nutrition Care Education: Topics covered: weight management Basic nutrition: basic food groups, appropriate nutrient balance, general nutrition guidelines    Weight control: benefits of weight control, guidance for 1200-1300kcal intake, importance of low fat and low sugar food choices, portion control, benefits of and options for tracking food intake, role of physical  activity. Advanced nutrition: dining out Hypertension:  identifying high sodium foods, identifying food sources of potassium, magnesium   Nutritional Diagnosis:  [place]-3.3 Overweight/obesity As related to history of weight fluctuation, steriod treatment, inactivity, excess calories.  As evidenced by patient report.  Intervention: Instruction as noted above.   Set goals with patient input.   Patient has good background knowledge of strategies for healthy weight loss; she will work to improve current eating habits and gradually increase physical activity as she is able.   She will be out of state until March 2018; she will schedule follow-up at that time if needed.  Education Materials given:  . Plate Planner . Food lists/ Planning A Balanced Meal . Sample meal pattern/ menus . Goals/ instructions  Learner/ who was taught:  . Patient   Level of understanding: Marland Kitchen Verbalizes/ demonstrates competency  Demonstrated degree of understanding via:   Teach back Learning barriers: . None  Willingness to learn/ readiness for change: . Eager, change in progress   Monitoring and Evaluation:  Dietary intake, exercise, and body weight      follow up: prn

## 2016-07-13 NOTE — Patient Instructions (Signed)
   Continue to work on portion control -- keep meat portions to [employer]) or less each meal. Make sure to include generous vegetable portions.   Keep a food diary of everything you eat to stay fully aware, and control calories. You can try a phone app or website such as Lose It, Spark people, or MyFitnessPal to track intake for you. Goal is 1200-1300kcal daily average.   Gradually add some light activity as you are able, slow walking, or chair-pedaling or water exercise.

## 2016-07-15 DIAGNOSIS — R0902 Hypoxemia: Secondary | ICD-10-CM | POA: Diagnosis not present

## 2016-07-31 ENCOUNTER — Other Ambulatory Visit: Payer: Self-pay | Admitting: Family Medicine

## 2016-08-10 NOTE — Addendum Note (Signed)
Addended by: Doroteo Glassman D on: 08/10/2016 03:07 PM   Modules accepted: Orders

## 2016-08-13 ENCOUNTER — Other Ambulatory Visit: Payer: Self-pay | Admitting: Internal Medicine

## 2016-08-15 DIAGNOSIS — R0902 Hypoxemia: Secondary | ICD-10-CM | POA: Diagnosis not present

## 2016-08-17 ENCOUNTER — Other Ambulatory Visit: Payer: Self-pay | Admitting: Family Medicine

## 2016-09-15 DIAGNOSIS — R0902 Hypoxemia: Secondary | ICD-10-CM | POA: Diagnosis not present

## 2016-09-28 ENCOUNTER — Encounter: Payer: Self-pay | Admitting: Internal Medicine

## 2016-09-28 ENCOUNTER — Ambulatory Visit (INDEPENDENT_AMBULATORY_CARE_PROVIDER_SITE_OTHER): Payer: Medicare Other | Admitting: Internal Medicine

## 2016-09-28 DIAGNOSIS — J9612 Chronic respiratory failure with hypercapnia: Secondary | ICD-10-CM | POA: Diagnosis not present

## 2016-09-28 DIAGNOSIS — R918 Other nonspecific abnormal finding of lung field: Secondary | ICD-10-CM

## 2016-09-28 DIAGNOSIS — J9611 Chronic respiratory failure with hypoxia: Secondary | ICD-10-CM

## 2016-09-28 DIAGNOSIS — J45991 Cough variant asthma: Secondary | ICD-10-CM | POA: Diagnosis not present

## 2016-09-28 NOTE — Assessment & Plan Note (Signed)
-  PFT's 01/31/2013 wnl x low fef25-75 and 12% better fev1 p B2 - 06/26/2014 p extensive coaching HFA effectiveness =    75% . Start qvar 80 2bid  - trial of qvar 80 1 bid 10/01/14 > improved 01/02/15 > flared on low dose qvar 02/2015 > back on qvar 80 2bid rec  04/16/2015 > improved 04/30/2015  - added flutter 09/23/2015 >>>   - Singulair rx 10/23/15 def decrease in albuterol need/ decrease wheeze and improved sob > no better on flovent 02/04/2016 so d/c'd flovent    All goals of chronic asthma control met including optimal function and elimination of symptoms with minimal need for rescue therapy.  Contingencies discussed in full including contacting this office immediately if not controlling the symptoms using the rule of two's.

## 2016-09-28 NOTE — Assessment & Plan Note (Signed)
Body mass index is 40.48 kg/m.  trending down/ encouraged/ lower doses of prednisone will also help Lab Results  Component Value Date   TSH 1.74 05/07/2016     Contributing to gerd risk/ doe/reviewed the need and the process to achieve and maintain neg calorie balance > defer f/u primary care including intermittently monitoring thyroid status

## 2016-09-28 NOTE — Patient Instructions (Signed)
Try prednisone 15 mg daily until return   See calendar for specific medication instructions and bring it back for each and every office visit for every healthcare provider you see.  Without it,  you may not receive the best quality medical care that we feel you deserve.  You will note that the calendar groups together  your maintenance  medications that are timed at particular times of the day.  Think of this as your checklist for what your doctor has instructed you to do until your next evaluation to see what benefit  there is  to staying on a consistent group of medications intended to keep you well.  The other group at the bottom is entirely up to you to use as you see fit  for specific symptoms that may arise between visits that require you to treat them on an as needed basis.  Think of this as your action plan or "what if" list.   Separating the top medications from the bottom group is fundamental to providing you adequate care going forward.    Please schedule a follow up visit in 3 months but call sooner if needed

## 2016-09-28 NOTE — Assessment & Plan Note (Signed)
11/24/2012 Office walk showed desats 86% on RA  With  spirometry FEV1 nml-73% , ratio 80   11/25/2012 CTA Chest >>neg for PE/ ILD  12/07/12 ONO > Pos 5: 67min  begin O2 2 l/m At bedtime  12/13/2012  >  Repeat 02/01/13 on 2lpm 22min> no change rx  - 01/31/2013  Walked RA x 3 laps @ 185 ft each stopped due to  End of study, no desats  - 07/03/2013 sats 75% RA >  On 3lpm sats 93%  > rx with 24 h 02 at 3lpm   - improved by self monitoring as of 07/24/13 ov so changed rx to 2lpm/24/7 humidified due to nasal dryness - 09/06/2013  Walked 2lpm x  2 laps @ 185 ft each stopped due to legs tired, no desats    - 04/18/2014  Walked RA  @ mod pace x 2 laps @ 185 ft each stopped due to  Hip pain, slow pace, sats 89% at end  - 05/16/2014  [facility] RA  2 laps @ 185 ft each stopped due to sob/fatigue but no desats off pred x 04/10/14  - 04/16/2015  Walked RA x 3 laps @ 185 ft each stopped due to   - ONO RA   10/05/2014 >  02 sat < 89% 213 min so rec resume 2lpm and work on wt loss   - 04/16/2015  Walked RA x 3 laps @ 185 ft each stopped due to  91% End of study, nl pace, no sob or desat - HCO3  01/06/16 = 35    - HCO3  05/07/2016  = 39 assoc with wt gain   As of  09/28/2016    02 2lpm at hs and prn to maintain > 90%

## 2016-09-28 NOTE — Assessment & Plan Note (Signed)
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 > discussed with IR, rec FOB > done 07/05/2013 > nl airways > neg afb/fungus/path  - IR Bx 07/11/2013 > inflammatory, special stains pending but does not look like tb or fungus and responds short course pred per pt > rec prednisone x 6 days only and needs VATS bx but declines as of 07/13/2013 > rec'd final path from [facility] [place] review > c/w BOOP though can't exclude asp/infection sources - 07/19/2013 notified pt and will return 07/24/13 for cxr/ esr then consider steroid rx (needs crypto serology also) - 07/24/2013 ESR 42 off pred x 3 weeks - 07/24/13 > crypto serology neg/ Anti-CCP neg  - started maint   prednisone 09/06/2013  >>> cxr cleared 10/18/2013 on 20 mg per day > rec taper to 20/10 - 11/28/13 reduced to 10 mg daily  - 03/07/2014 5 mg x 2 weeks then 5 mg qod x 2 weeks and stopped pred  04/10/14  - ESR 04/16/2015  = 24 / recurrent nodules > rec pred 20 mg daily > nodules resolved on f/u cxr 06/14/2015  - 04/30/2015 rec ceiling of 20 and floor of 10/5  - ESR 11/04/2015 =  23  On pred 10/5, rec taper off x 6 weeks if tol> flared in June 2017 so resumed last week in June 2017 20 ceiling/ 10/5 floor - ESR 05/07/2016   =  17 on prednisone 20 mg daily> rec 20/10 > did not tol - 09/28/2016 rec reduce from 20 mg to 15 mg daily    The goal with a chronic steroid dependent illness is always arriving at the lowest effective dose that controls the disease/symptoms and not accepting a set "formula" which is based on statistics or guidelines that don't always take into account patient  variability or the natural hx of the dz in every individual patient, which may well vary over time.  For now therefore I recommend the patient maintain  A new floor of 15 mg daily if tolerates  I had an extended discussion with the patient reviewing all relevant studies completed to date and   lasting 15 to 20 minutes of a 25 minute visit    Each maintenance medication was reviewed in detail including most importantly the difference between maintenance and prns and under what circumstances the prns are to be triggered using an action plan format that is not reflected in the computer generated alphabetically organized AVS but trather by a customized med calendar that reflects the AVS meds with confirmed 100% correlation.   In addition, Please see AVS for unique instructions that I personally wrote and verbalized to the the pt in detail and then reviewed with pt  by my nurse highlighting any  changes in therapy recommended at today's visit to their plan of care.

## 2016-09-28 NOTE — Progress Notes (Signed)
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 Follow-up    Pt had PET scan done this am. Her cough and SOB have improved since the last visit.   saba inhaler helps some/ min mucoid sputum, temp to 99 but no rigors, no purulent sputum or hemoptysis/ does have some wt. Loss, no sweating.  rec Dulera 100 Take 2 puffs first thing in am and then another 2 puffs about 12 hours later.  Prednisone 10 mg take  4 each am x 2 days,   2 each am x 2 days,  1 each am x 2 days and stop Only use your albuterol as a rescue medication  We will arrange 24/7 02 at 3lpm per advanced  We will call to arrange a biopsy of the easiest area once I discuss this with radiology  Late add: Discussed in detail all the  indications, usual  risks and alternatives  relative to the benefits with patient who agrees to proceed with bronchoscopy with biopsy.   FOB > done 07/05/2013 > nl airways > neg afb/fungus/path  - IR Bx 07/11/2013 > inflammatory, special stains pending but does not look like tb or fungus and responds short course pred per pt > rec prednisone x 6 days only and needs VATS bx but declines as of 07/13/2013 > rec'd final path from [place] review > c/w BOOP though can't exclude asp/infection sources    11/04/2015  f/u ov/Kelli Velasquez re: BOOP/ cough variant asthma  10/5  Plus flovent 110/ singulair plus nexium ac and zantact hs Chief Complaint  Patient presents with  . Follow-up    Breathing is overall doing well. She has occ wheeze in the am. She is using rescue inhaler  2 x per wk on average.   cough/ rhinitis seasonal pattern much better on singulair per Dr Glori Bickers x 2 weeks   No  sob with adl's but not pushing herself much at this point  - waiting for the pool to open, limited by L hip  rec Try prednisone 5 mg daily x 2 week, then 5 mg every other day x 2 weeks, then pred 5 mg every third day x 2 weeks and stop At any point if worsen > go back to the previous dose that worked    02/04/2016  f/u ov/Kelli Velasquez re: BOOP/ chronic resp failure   Plus flovent 110/ singulair plus nexium ac and zantact hs Chief Complaint  Patient presents with  . Follow-up    Breathing has improved on 20 mg of pred- she increased from 10 mg 3 wks ago. She is not coughing much.   able to come off prednisone for 5 weeks, then gradually gradually worse sob / cough requiring 20 mg per day x 3 weeks and back to baseline next but quite hoarse ? From flovent and has seen cards with plan for stress test rec  Stop flovent and your hoarseness should improve Prednisone 10 mg take 2 daily until 100% back to normal  and then 1 x 2 weeks then one alternating with one half as you were before - at any point you get worse breathing or coughing then increase to previous dose -  02 2lpm at bedtime and as needed during the day to maintain sats over 90%     05/07/2016  f/u ov/Kelli Velasquez re:  BOOP/ chronic rf/ pred at 20 mg daily  Chief Complaint  Patient presents with  . Follow-up    Hoarsness is some better, comes and goes and worse in the am. Her sats have been lower and so she has been using o2 more during the day. She is having more SOB. Taking 20 mg of pred.    rec Please see patient coordinator before you leave today  to schedule nutrition eval with food diary x 2 weeks Please remember to go to the lab and x-ray department downstairs for your tests - we will call you with the results when they are available. Reduce  The  Prednisone to 20 mg alternating with 10   Starting around May 27 2016 10 x 3 week  then 10/5 but in meantime "caught cold" from son onset 06/08/16  and after 10/5 x a few days  then back to 20/10     09/28/2016  f/u ov/Kelli Velasquez re: BOOP/ pred 20 mg daily (tol 20/10 just 10 days then worse) / maint singulair/ prn saba for asthma component  Chief Complaint  Patient presents with  . Follow-up    pt c/o stable sob with exertion.  denies cough, mucus production.    back to baseline doe = MMRC3 = can't walk 100 yards even at a slow pace at a flat grade s stopping due to sob    No obvious day to day or daytime variability or assoc excess/ purulent sputum or mucus plugs or hemoptysis or cp or chest tightness, subjective wheeze or overt sinus or hb symptoms. No unusual exp hx or h/o childhood pna/ asthma or knowledge of premature birth.  Sleeping ok without nocturnal  or early am exacerbation  of respiratory  c/o's or need for noct saba. Also denies any obvious fluctuation of symptoms with weather or environmental changes or other aggravating or alleviating factors except as outlined above   Current Medications, Allergies, Complete Past Medical History, Past Surgical History, Family History, and Social History were reviewed in [facility] record.  ROS  The following are not active complaints unless bolded sore throat, dysphagia, dental problems, itching, sneezing,  nasal congestion or excess/ purulent secretions, ear ache,   fever, chills, sweats, unintended wt loss, classically pleuritic or exertional cp,  orthopnea pnd or leg swelling, presyncope, palpitations, abdominal pain, anorexia, nausea, vomiting, diarrhea  or change in bowel or bladder habits, change in stools or urine, dysuria,hematuria,  rash, arthralgias, visual complaints, headache, numbness, weakness or ataxia or problems with walking or coordination,  change in mood/affect or memory.                     PMH:  -Hx of NSTEMI.07/2009 w/  cardiac cath s/p 2 stents in LAD/ RCA       >>Echo at that  time showed Mild LVH, EF of 55% and grade 1 diastolic dysfuntion  -Hyperlipidemia -HTN  -OA -s/p bilat. Knee replacements - Obesity    SH:  Retired from [employer] work Former smoker -quit 1984 -smoked x 20y x 1 PPD  Married  No pets  No unusual hobbies  [place] on vacation frequently (  1-10/2012 -traveled to [place], [place] and [place] )           Objective:   Physical Exam 01/31/2013   237  > 07/03/2013  232 > 07/24/2013  231 > 09/06/2013  233 > 04/18/2014 256  10/18/2013  233 > 11/29/2013 238 >  03/07/2014  249 > 05/17/2014  263 > 06/26/2014 261>   10/01/2014  247 > 01/02/2015  253 >  04/16/2015 247 >  04/30/2015  244 > 06/14/2015   246 > 09/23/2015   255 > 11/04/2015 253  >  05/07/2016 259 > 06/23/2016   265  > 09/28/2016   247    - Note on arrival 02 sats  95% on 2lpm      GEN: A/Ox3; pleasant , NAD, obese - vital signs reviewed     HEENT:  [place]/AT,  EACs-clear, TMs-wnl, NOSE-clear drainage  THROAT-clear, no lesions, no postnasal drip or exudate noted.   NECK:  Supple w/ fair ROM; no JVD; normal carotid impulses w/o bruits; no thyromegaly or nodules palpated; no lymphadenopathy.    RESP  Clear  P & A; w/o, wheezes/ rales/ or rhonchi. no accessory muscle use, no dullness to percussion  CARD:  RRR, no m/r/g  , trace to 1+ pitting  LE  edema sym  pulses intact, no cyanosis or clubbing.  GI:   Soft & nt; nml bowel sounds; no organomegaly or masses detected.   Musco: Warm bil, no deformities or joint swelling noted.   Neuro: alert, no focal deficits noted.    Skin: Warm, no lesions or rashes

## 2016-09-30 ENCOUNTER — Ambulatory Visit (INDEPENDENT_AMBULATORY_CARE_PROVIDER_SITE_OTHER): Payer: Medicare Other | Admitting: *Deleted

## 2016-09-30 DIAGNOSIS — E538 Deficiency of other specified B group vitamins: Secondary | ICD-10-CM

## 2016-09-30 MED ORDER — CYANOCOBALAMIN 1000 MCG/ML IJ SOLN
1000.0000 ug | INTRAMUSCULAR | Status: DC
  Administered 2016-09-30: 1000 ug via INTRAMUSCULAR

## 2016-10-07 DIAGNOSIS — L821 Other seborrheic keratosis: Secondary | ICD-10-CM | POA: Diagnosis not present

## 2016-10-07 DIAGNOSIS — L219 Seborrheic dermatitis, unspecified: Secondary | ICD-10-CM | POA: Diagnosis not present

## 2016-10-07 DIAGNOSIS — L570 Actinic keratosis: Secondary | ICD-10-CM | POA: Diagnosis not present

## 2016-10-07 DIAGNOSIS — L918 Other hypertrophic disorders of the skin: Secondary | ICD-10-CM | POA: Diagnosis not present

## 2016-10-07 DIAGNOSIS — L820 Inflamed seborrheic keratosis: Secondary | ICD-10-CM | POA: Diagnosis not present

## 2016-10-07 DIAGNOSIS — D235 Other benign neoplasm of skin of trunk: Secondary | ICD-10-CM | POA: Diagnosis not present

## 2016-10-08 ENCOUNTER — Telehealth: Payer: Self-pay | Admitting: Family Medicine

## 2016-10-08 ENCOUNTER — Other Ambulatory Visit: Payer: Self-pay | Admitting: Family Medicine

## 2016-10-08 NOTE — Telephone Encounter (Signed)
No recent/future appts please advise

## 2016-10-08 NOTE — Telephone Encounter (Signed)
Please refill for 6 months 

## 2016-10-08 NOTE — Telephone Encounter (Signed)
Left pt message asking to call Allison back directly at [phone] to schedule AWV.+ labs with Lesia and CPE with PCP. °

## 2016-10-08 NOTE — Telephone Encounter (Signed)
done

## 2016-10-11 ENCOUNTER — Other Ambulatory Visit: Payer: Self-pay | Admitting: Internal Medicine

## 2016-10-13 DIAGNOSIS — R0902 Hypoxemia: Secondary | ICD-10-CM | POA: Diagnosis not present

## 2016-11-01 ENCOUNTER — Other Ambulatory Visit: Payer: Self-pay | Admitting: Cardiovascular Disease

## 2016-11-05 DIAGNOSIS — H2513 Age-related nuclear cataract, bilateral: Secondary | ICD-10-CM | POA: Diagnosis not present

## 2016-11-13 ENCOUNTER — Other Ambulatory Visit: Payer: Self-pay | Admitting: *Deleted

## 2016-11-13 DIAGNOSIS — R0902 Hypoxemia: Secondary | ICD-10-CM | POA: Diagnosis not present

## 2016-11-13 MED ORDER — POTASSIUM CHLORIDE CRYS ER 20 MEQ PO TBCR: 20.0000 meq | EXTENDED_RELEASE_TABLET | Freq: Every day | ORAL | 0 refills | Status: DC | PRN

## 2016-11-13 NOTE — Telephone Encounter (Signed)
Fax refill request, no recent/future appts., please advise

## 2016-11-20 ENCOUNTER — Other Ambulatory Visit (INDEPENDENT_AMBULATORY_CARE_PROVIDER_SITE_OTHER): Payer: Medicare Other

## 2016-11-20 ENCOUNTER — Ambulatory Visit (INDEPENDENT_AMBULATORY_CARE_PROVIDER_SITE_OTHER)
Admission: RE | Admit: 2016-11-20 | Discharge: 2016-11-20 | Disposition: A | Discharge status: Home / Self Care | Payer: Medicare Other | Source: Ambulatory Visit | Attending: Internal Medicine | Admitting: Internal Medicine

## 2016-11-20 ENCOUNTER — Encounter: Payer: Self-pay | Admitting: Internal Medicine

## 2016-11-20 ENCOUNTER — Ambulatory Visit (INDEPENDENT_AMBULATORY_CARE_PROVIDER_SITE_OTHER): Payer: Medicare Other | Admitting: Internal Medicine

## 2016-11-20 VITALS — BP 144/84 | HR 84 | Temp 97.8°F | Ht 65.5 in | Wt 255.0 lb

## 2016-11-20 DIAGNOSIS — R918 Other nonspecific abnormal finding of lung field: Secondary | ICD-10-CM

## 2016-11-20 DIAGNOSIS — J9611 Chronic respiratory failure with hypoxia: Secondary | ICD-10-CM | POA: Diagnosis not present

## 2016-11-20 DIAGNOSIS — J45991 Cough variant asthma: Secondary | ICD-10-CM

## 2016-11-20 DIAGNOSIS — J9612 Chronic respiratory failure with hypercapnia: Secondary | ICD-10-CM

## 2016-11-20 DIAGNOSIS — R05 Cough: Secondary | ICD-10-CM | POA: Diagnosis not present

## 2016-11-20 LAB — CBC WITH DIFFERENTIAL/PLATELET
BASOS ABS: 0.1 10*3/uL (ref 0.0–0.1)
Basophils Relative: 0.3 % (ref 0.0–3.0)
EOS PCT: 2.4 % (ref 0.0–5.0)
Eosinophils Absolute: 0.5 10*3/uL (ref 0.0–0.7)
HEMATOCRIT: 42.4 % (ref 36.0–46.0)
Hemoglobin: 13.7 g/dL (ref 12.0–15.0)
LYMPHS ABS: 2.3 10*3/uL (ref 0.7–4.0)
LYMPHS PCT: 12.2 % (ref 12.0–46.0)
MCHC: 32.3 g/dL (ref 30.0–36.0)
MCV: 94 fl (ref 78.0–100.0)
Monocytes Absolute: 1 10*3/uL (ref 0.1–1.0)
Monocytes Relative: 5.3 % (ref 3.0–12.0)
NEUTROS ABS: 14.8 10*3/uL — AB (ref 1.4–7.7)
NEUTROS PCT: 79.8 % — AB (ref 43.0–77.0)
Platelets: 331 10*3/uL (ref 150.0–400.0)
RBC: 4.51 Mil/uL (ref 3.87–5.11)
RDW: 16.4 % — ABNORMAL HIGH (ref 11.5–15.5)
WBC: 18.6 10*3/uL (ref 4.0–10.5)

## 2016-11-20 LAB — SEDIMENTATION RATE: Sed Rate: 21 mm/hr (ref 0–30)

## 2016-11-20 MED ORDER — FUROSEMIDE 40 MG PO TABS: 40.0000 mg | ORAL_TABLET | Freq: Every day | ORAL | 2 refills | Status: DC

## 2016-11-20 MED ORDER — FUROSEMIDE 20 MG PO TABS: 20.0000 mg | ORAL_TABLET | Freq: Every day | ORAL | 2 refills | Status: DC

## 2016-11-20 NOTE — Progress Notes (Signed)
Spoke with pt and notified of results per Dr. Wert. Pt verbalized understanding and denied any questions. 

## 2016-11-20 NOTE — Patient Instructions (Addendum)
Please remember to go to the lab and x-ray department downstairs in the basement  for your tests - we will call you with the results when they are available.     For drainage / throat tickle try take CHLORPHENIRAMINE  4 mg - take one every 4 hours as needed - available over the counter- may cause drowsiness so start with just a bedtime dose or two and see how you tolerate it before trying in daytime  And can use it up to 2 every 4 hours   GERD (REFLUX)  is an extremely common cause of respiratory symptoms just like yours , many times with no obvious heartburn at all.    It can be treated with medication, but also with lifestyle changes including elevation of the head of your bed (ideally with 6 inch  bed blocks),  Smoking cessation, avoidance of late meals, excessive alcohol, and avoid fatty foods, chocolate, peppermint, colas, red wine, and acidic juices such as orange juice.  NO MINT OR MENTHOL PRODUCTS SO NO COUGH DROPS  USE SUGARLESS CANDY INSTEAD (Jolley ranchers or Stover's or Life Savers) or even ice chips will also do - the key is to swallow to prevent all throat clearing. NO OIL BASED VITAMINS - use powdered substitutes.     See calendar for specific medication instructions and bring it back for each and every office visit for every healthcare provider you see.  Without it,  you may not receive the best quality medical care that we feel you deserve.  You will note that the calendar groups together  your maintenance  medications that are timed at particular times of the day.  Think of this as your checklist for what your doctor has instructed you to do until your next evaluation to see what benefit  there is  to staying on a consistent group of medications intended to keep you well.  The other group at the bottom is entirely up to you to use as you see fit  for specific symptoms that may arise between visits that require you to treat them on an as needed basis.  Think of this as your action  plan or "what if" list.   Separating the top medications from the bottom group is fundamental to providing you adequate care going forward.    Keep your previous appt  Add :  Consider challenging with symbicort to see if helps cough/ systemic steroid dep

## 2016-11-20 NOTE — Progress Notes (Signed)
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 Follow-up    Pt had PET scan done this am. Her cough and SOB have improved since the last visit.   saba inhaler helps some/ min mucoid sputum, temp to 99 but no rigors, no purulent sputum or hemoptysis/ does have some wt. Loss, no sweating.  rec Dulera 100 Take 2 puffs first thing in am and then another 2 puffs about 12 hours later.  Prednisone 10 mg take  4 each am x 2 days,   2 each am x 2 days,  1 each am x 2 days and stop Only use your albuterol as a rescue medication  We will arrange 24/7 02 at 3lpm per advanced  We will call to arrange a biopsy of the easiest area once I discuss this with radiology  Late add: Discussed in detail all the  indications, usual  risks and alternatives  relative to the benefits with patient who agrees to proceed with bronchoscopy with biopsy.   FOB > done 07/05/2013 > nl airways > neg afb/fungus/path  - IR Bx 07/11/2013 > inflammatory, special stains pending but does not look like tb or fungus and responds short course pred per pt > rec prednisone x 6 days only and needs VATS bx but declines as of 07/13/2013 > rec'd final path from [place] review > c/w BOOP though can't exclude asp/infection sources    11/04/2015  f/u ov/Kelli Velasquez re: BOOP/ cough variant asthma  10/5  Plus flovent 110/ singulair plus nexium ac and zantact hs Chief Complaint  Patient presents with  . Follow-up    Breathing is overall doing well. She has occ wheeze in the am. She is using rescue inhaler  2 x per wk on average.   cough/ rhinitis seasonal pattern much better on singulair per Kelli Velasquez x 2 weeks   No  sob with adl's but not pushing herself much at this point  - waiting for the pool to open, limited by L hip  rec Try prednisone 5 mg daily x 2 week, then 5 mg every other day x 2 weeks, then pred 5 mg every third day x 2 weeks and stop At any point if worsen > go back to the previous dose that worked   Never got off pred completely and Starting around May 27 2016 10 x 3 week then 10/5 but in meantime "caught cold" from son onset 06/08/16  and after 10/5 x a few days  then back to 20/10     09/28/2016  f/u ov/Kelli Velasquez re: BOOP/ pred 20 mg daily (tol 20/10 just 10 days then worse) / maint singulair/ prn saba for asthma component  Chief Complaint  Patient presents with  . Follow-up    pt c/o stable sob with exertion.  denies cough, mucus production.    back to baseline doe = MMRC3 = can't walk 100 yards even at a slow pace at a flat grade s stopping due to sob  rec Try prednisone 15 mg daily until return  See calendar for specific medication instructions    11/20/2016 acute extended ov/Kelli Velasquez re:  BOOP/ pred 20/15 alternating and 2lpm 24/7 and increased to 3lpm to control Chief Complaint  Patient presents with  . Acute Visit    Pt c/o increased SOB, cough, and fatigue for the past 1-2 wks. Her cough is non prod. She states she wakes up in the am very tired.    gradually sob /cough worse p eating / eats 10 pm, no candy handy and having lots of throat clearing/ sensation of daytime pnds / no better with saba  Doe now = MMRC3 = can't walk 100 yards even at a slow pace at a flat grade s stopping due to sob    No obvious day to day or daytime variability or assoc excess/ purulent sputum or mucus plugs or hemoptysis or cp or chest tightness, subjective wheeze or overt sinus or hb symptoms. No unusual exp hx or h/o childhood pna/ asthma or knowledge of premature birth.  Sleeping ok without  nocturnal  or early am exacerbation  of respiratory  c/o's or need for noct saba. Also denies any obvious fluctuation of symptoms with weather or environmental changes or other aggravating or alleviating factors except as outlined above   Current Medications, Allergies, Complete Past Medical History, Past Surgical History, Family History, and Social History were reviewed in [facility] record.  ROS  The following are not active complaints unless bolded sore throat, dysphagia, dental problems, itching, sneezing,  nasal congestion or excess/ purulent secretions, ear ache,   fever, chills, sweats, unintended wt loss, classically pleuritic or exertional cp,  orthopnea pnd or leg swelling, presyncope, palpitations, abdominal pain, anorexia, nausea, vomiting, diarrhea  or change in bowel or bladder habits, change in stools or urine, dysuria,hematuria,  rash, arthralgias, visual complaints, headache, numbness, weakness or ataxia or problems with walking or coordination,  change in mood/affect or memory.                   PMH:  -Hx of NSTEMI.07/2009 w/  cardiac cath s/p 2 stents in LAD/ RCA       >>Echo at that time showed Mild LVH, EF of 55% and grade 1 diastolic dysfuntion  -Hyperlipidemia -HTN  -OA -s/p bilat. Knee replacements - Obesity    SH:  Retired from [employer] work Former smoker -quit 1984 -smoked x 20y x 1 PPD  Married  No pets  No unusual hobbies  [place] on vacation frequently (1-10/2012 -traveled to [place], [place] and [place] )           Objective:   Physical Exam 01/31/2013   237  > 07/03/2013  232 > 07/24/2013  231 > 09/06/2013  233 > 04/18/2014 256  10/18/2013  233 > 11/29/2013 238 >  03/07/2014  249 > 05/17/2014  263 > 06/26/2014 261>   10/01/2014  247 > 01/02/2015  253 >  04/16/2015 247 >  04/30/2015  244 > 06/14/2015   246 > 09/23/2015   255 > 11/04/2015 253  >  05/07/2016 259 > 06/23/2016   265  > 09/28/2016   247 > 11/20/2016  255    - Note on arrival 02 sats  93%  on 2lpm      GEN: A/Ox3; pleasant , NAD, obese - vital signs reviewed     HEENT:  [place]/AT,  EACs-clear, TMs-wnl, NOSE-clear drainage  THROAT-clear, no lesions, no postnasal drip or exudate noted.  NECK:  Supple w/ fair ROM; no JVD; normal carotid impulses w/o bruits; no thyromegaly or nodules palpated; no lymphadenopathy.    RESP   bs are distant bilaterally / no wheeze on ex/ no crackles  CARD:  RRR, no m/r/g  , trace   pitting  LE  L > R edema sym  pulses intact, no cyanosis or clubbing.  GI:   Soft & nt; nml bowel sounds; no organomegaly or masses detected.   Musco: Warm bil, no deformities or joint swelling noted.   Neuro: alert, no focal deficits noted.    Skin: Warm, no lesions or rashes    CXR PA and Lateral:   11/20/2016 :    I personally reviewed images and agree with radiology impression as follows:    No active cardiopulmonary disease.     Lab Results  Component Value Date   ESRSEDRATE 21 11/20/2016   ESRSEDRATE 17 05/07/2016   ESRSEDRATE 23 (H) 11/04/2015   Lab Results  Component Value Date   WBC 18.6 Repeated and verified X2. (HH) 11/20/2016   HGB 13.7 11/20/2016   HCT 42.4 11/20/2016   MCV 94.0 11/20/2016   PLT 331.0 11/20/2016       EOS                        0.5                                                                  11/20/2016

## 2016-11-22 ENCOUNTER — Encounter: Payer: Self-pay | Admitting: Internal Medicine

## 2016-11-22 NOTE — Assessment & Plan Note (Signed)
-   PFT's 01/31/2013 wnl x low fef25-75 and 12% better fev1 p B2 - 06/26/2014 p extensive coaching HFA effectiveness =    75% . Start qvar 80 2bid  - trial of qvar 80 1 bid 10/01/14 > improved 01/02/15 > flared on low dose qvar 02/2015 > back on qvar 80 2bid rec  04/16/2015 > improved 04/30/2015  - added flutter 09/23/2015 >>>   - Singulair rx 10/23/15 def decrease in albuterol need/ decrease wheeze and improved sob > no better on flovent 02/04/2016 so d/c'd flovent - Allergy profile 11/20/2016 >  Eos 0.5/  IgE     Almost certainly this is not asthma related cough but Upper airway cough syndrome (previously labeled PNDS) , is  so named because it's frequently impossible to sort out how much is  CR/sinusitis with freq throat clearing (which can be related to primary GERD)   vs  causing  secondary (" extra esophageal")  GERD from wide swings in gastric pressure that occur with throat clearing, often  promoting self use of mint and menthol lozenges that reduce the lower esophageal sphincter tone and exacerbate the problem further in a cyclical fashion.   These are the same pts (now being labeled as having "irritable larynx syndrome" by some cough centers) who not infrequently have a history of having failed to tolerate ace inhibitors,  dry powder inhalers or biphosphonates or report having atypical/extraesophageal reflux symptoms that don't respond to standard doses of PPI  and are easily confused as having aecopd or asthma flares by even experienced allergists/ pulmonologists (myself included).   For now needs to go back to strict gerd diet / lifestyle and work harder on wait loss which is being aggravated now by her dep on prednisone for reasons that aren't clear as this does not appear to be asthma flare.  Can  next consider rechallenging with laba/ics if not improving though doubt it will help if saba doesn't

## 2016-11-22 NOTE — Assessment & Plan Note (Signed)
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 > discussed with IR, rec FOB > done 07/05/2013 > nl airways > neg afb/fungus/path  - IR Bx 07/11/2013 > inflammatory, special stains pending but does not look like tb or fungus and responds short course pred per pt > rec prednisone x 6 days only and needs VATS bx but declines as of 07/13/2013 > rec'd final path from [place] review > c/w BOOP though can't exclude asp/infection sources - 07/19/2013 notified pt and will return 07/24/13 for cxr/ esr then consider steroid rx (needs crypto serology also) - 07/24/2013 ESR 42 off pred x 3 weeks - 07/24/13 > crypto serology neg/ Anti-CCP neg  - started maint   prednisone 09/06/2013  >>> cxr cleared 10/18/2013 on 20 mg per day > rec taper to 20/10 - 11/28/13 reduced to 10 mg daily  - 03/07/2014 5 mg x 2 weeks then 5 mg qod x 2 weeks and stopped pred  04/10/14  - ESR 04/16/2015  = 24 / recurrent nodules > rec pred 20 mg daily > nodules resolved on f/u cxr 06/14/2015  - 04/30/2015 rec ceiling of 20 and floor of 10/5  - ESR 11/04/2015 =  23  On pred 10/5, rec taper off x 6 weeks if tol> flared in June 2017 so resumed last week in June 2017 20 ceiling/ 10/5 floor - ESR 05/07/2016   =  17 on prednisone 20 mg daily> rec 20/10 > did not tol - 09/28/2016 rec reduce from 20 mg to 15 mg daily   - ESR 11/20/2016  =  21 on pred 20/15 so rec 15 mg daily   The goal with a chronic steroid dependent illness is always arriving at the lowest effective dose that controls the disease/symptoms and not accepting a set "formula" which is based on statistics or guidelines that don't always take into account patient  variability or the natural hx of the dz in every individual patient, which may well vary over time.  For now therefore I recommend the patient maintain  15 mg daily   I had an extended discussion with the patient reviewing all relevant  studies completed to date and  lasting 15 to 20 minutes of a 25 minute visit    Each maintenance medication was reviewed in detail including most importantly the difference between maintenance and prns and under what circumstances the prns are to be triggered using an action plan format that is not reflected in the computer generated alphabetically organized AVS.    Please see AVS for specific instructions unique to this visit that I personally wrote and verbalized to the the pt in detail and then reviewed with pt  by my nurse highlighting any  changes in therapy recommended at today's visit to their plan of care.

## 2016-11-22 NOTE — Assessment & Plan Note (Addendum)
Body mass index is 41.79 kg/m.  -  trending up as pred increased Lab Results  Component Value Date   TSH 1.74 05/07/2016     Contributing to gerd risk/ doe/reviewed the need and the process to achieve and maintain neg calorie balance > defer f/u primary care including intermittently monitoring thyroid status

## 2016-11-22 NOTE — Assessment & Plan Note (Signed)
11/24/2012 Office walk showed desats 86% on RA  With  spirometry FEV1 nml-73% , ratio 80   11/25/2012 [facility] Chest >>neg for PE/ ILD  12/07/12 ONO > Pos 5: 95min  begin O2 2 l/m At bedtime  12/13/2012  >  Repeat 02/01/13 on 2lpm 20min> no change rx  - 01/31/2013  Walked RA x 3 laps @ 185 ft each stopped due to  End of study, no desats  - 07/03/2013 sats 75% RA >  On 3lpm sats 93%  > rx with 24 h 02 at 3lpm   - improved by self monitoring as of 07/24/13 ov so changed rx to 2lpm/24/7 humidified due to nasal dryness - 09/06/2013  Walked 2lpm x  2 laps @ 185 ft each stopped due to legs tired, no desats    - 04/18/2014  Walked RA  @ mod pace x 2 laps @ 185 ft each stopped due to  Hip pain, slow pace, sats 89% at end  - 05/16/2014  [facility] RA  2 laps @ 185 ft each stopped due to sob/fatigue but no desats off pred x 04/10/14  - 04/16/2015  Walked RA x 3 laps @ 185 ft each stopped due to   - ONO RA   10/05/2014 >  02 sat < 89% 213 min so rec resume 2lpm and work on wt loss   - 04/16/2015  Walked RA x 3 laps @ 185 ft each stopped due to  91% End of study, nl pace, no sob or desat - HCO3  01/06/16 = 35    - HCO3  05/07/2016  = 39 assoc with wt gain   As of  11/20/2016    02 2lpm at hs and prn to maintain > 90%

## 2016-11-23 LAB — RESPIRATORY ALLERGY PROFILE REGION II ~~LOC~~
Allergen, C. Herbarum, M2: <0.1 kU/L
Allergen, Cedar tree, t12: <0.1 kU/L
Allergen, Comm Silver Birch, t9: <0.1 kU/L
Allergen, Cottonwood, t14: <0.1 kU/L
Allergen, D pternoyssinus,d7: 0.57 kU/L — ABNORMAL HIGH
Allergen, Mouse Urine Protein, e78: <0.1 kU/L
Allergen, Mulberry, t76: <0.1 kU/L
Allergen, Oak,t7: <0.1 kU/L
Bermuda Grass: <0.1 kU/L
Box Elder IgE: <0.1 kU/L
COCKROACH: 0.22 kU/L — AB
Cat Dander: <0.1 kU/L
D. FARINAE: 0.51 kU/L — AB
Dog Dander: <0.1 kU/L
Elm IgE: <0.1 kU/L
IgE (Immunoglobulin E), Serum: 109 kU/L (ref <115)
Rough Pigweed  IgE: <0.1 kU/L
Sheep Sorrel IgE: <0.1 kU/L

## 2016-11-23 NOTE — Progress Notes (Signed)
Spoke with pt and notified of results per Dr. Wert. Pt verbalized understanding and denied any questions. 

## 2016-11-24 ENCOUNTER — Telehealth: Payer: Self-pay | Admitting: Family Medicine

## 2016-11-24 ENCOUNTER — Ambulatory Visit: Payer: Medicare Other | Admitting: Family Medicine

## 2016-11-24 NOTE — Telephone Encounter (Signed)
Please let her know I hope she feels better soon - I know Dr Melvyn Novas is taking care of her but if she needs anything let us know

## 2016-11-24 NOTE — Telephone Encounter (Signed)
Husband notified of Dr. Marliss Coots comments and verbalized understanding

## 2016-11-24 NOTE — Telephone Encounter (Signed)
Pt spouse, Jenny Reichmann, called today to cancell 12:15 pm appt due to pt being too ill. I cancelled appt and spouse said they would call back to r/s.

## 2016-11-25 NOTE — Telephone Encounter (Signed)
Pt cancelled 5/1 appt with Dr. Glori Bickers due to illness.   -call back next week

## 2016-12-01 DIAGNOSIS — H2513 Age-related nuclear cataract, bilateral: Secondary | ICD-10-CM | POA: Diagnosis not present

## 2016-12-02 ENCOUNTER — Ambulatory Visit (INDEPENDENT_AMBULATORY_CARE_PROVIDER_SITE_OTHER): Payer: Medicare Other

## 2016-12-02 ENCOUNTER — Ambulatory Visit (INDEPENDENT_AMBULATORY_CARE_PROVIDER_SITE_OTHER): Payer: Medicare Other | Admitting: Family Medicine

## 2016-12-02 ENCOUNTER — Encounter: Payer: Self-pay | Admitting: Family Medicine

## 2016-12-02 VITALS — BP 140/80 | HR 93 | Temp 98.2°F | Ht 64.75 in | Wt 262.8 lb

## 2016-12-02 DIAGNOSIS — Z0000 Encounter for general adult medical examination without abnormal findings: Secondary | ICD-10-CM | POA: Diagnosis not present

## 2016-12-02 DIAGNOSIS — F329 Major depressive disorder, single episode, unspecified: Secondary | ICD-10-CM

## 2016-12-02 DIAGNOSIS — I10 Essential (primary) hypertension: Secondary | ICD-10-CM | POA: Diagnosis not present

## 2016-12-02 DIAGNOSIS — R4589 Other symptoms and signs involving emotional state: Secondary | ICD-10-CM | POA: Insufficient documentation

## 2016-12-02 DIAGNOSIS — R5382 Chronic fatigue, unspecified: Secondary | ICD-10-CM

## 2016-12-02 NOTE — Progress Notes (Signed)
Pre visit review using our clinic review tool, if applicable. No additional management support is needed unless otherwise documented below in the visit note. 

## 2016-12-02 NOTE — Assessment & Plan Note (Signed)
Discussed how this problem influences overall health and the risks it imposes  Reviewed plan for weight loss with lower calorie diet (via better food choices and also portion control or program like weight watchers) and exercise building up to or more than 30 minutes 5 days per week including some aerobic activity   Challenging since her activity is limited by sob  Disc starting back with wt watchers

## 2016-12-02 NOTE — Assessment & Plan Note (Signed)
bp in fair control at this time  BP Readings from Last 1 Encounters:  12/02/16 140/80   No changes needed Disc lifstyle change with low sodium diet and exercise

## 2016-12-02 NOTE — Progress Notes (Signed)
Subjective:   Kelli Velasquez is a 76 y.o. female who presents for an Initial Medicare Annual Wellness Visit.  Review of Systems    N/A  Cardiac Risk Factors include: advanced age (>14men, >30 women);obesity (BMI >30kg/m2);dyslipidemia;hypertension     Objective:    Today's Vitals   12/02/16 1342 12/02/16 1345  BP: 140/80   Pulse: 93   Temp: 98.2 F (36.8 C)   TempSrc: Oral   SpO2: 92%   Weight: 262 lb 12 oz (119.2 kg)   Height: 5' 4.75" (1.645 m)   PainSc: 0-No pain 0-No pain   Body mass index is 44.06 kg/m.   Current Medications (verified) Outpatient Encounter Prescriptions as of 12/02/2016  Medication Sig  . acetaminophen-codeine (TYLENOL #3) 300-30 MG tablet One every 4 hours as needed for cough  . amLODipine (NORVASC) 10 MG tablet TAKE 1 TABLET BY MOUTH DAILY.  Marland Kitchen aspirin 81 MG tablet Take 81 mg by mouth daily.  . chlorpheniramine (CHLOR-TRIMETON) 4 MG tablet 4 mg. Take 1  In the morning and 2 at bedtime  . cromolyn (NASALCROM) 5.2 MG/ACT nasal spray Place 2 sprays into both nostrils daily as needed for allergies.  . Cyanocobalamin (VITAMIN B12) 3000 MCG SUBL Place 1 capsule under the tongue daily.  Marland Kitchen dextromethorphan-guaiFENesin (MUCINEX DM) 30-600 MG 12hr tablet Take 1 tablet by mouth 2 (two) times daily as needed for cough.  . EPIPEN 2-PAK 0.3 MG/0.3ML SOAJ injection INJECT 0.3 MLS (0.3 MG TOTAL) INTO THE MUSCLE ONCE AS NEEDED FOR ALLERGIC REACTION  . esomeprazole (NEXIUM) 40 MG capsule TAKE 1 CAPSULE (40 MG TOTAL) BY MOUTH DAILY.  . furosemide (LASIX) 20 MG tablet Take 1 tablet (20 mg total) by mouth daily.  . furosemide (LASIX) 40 MG tablet Take 1 tablet (40 mg total) by mouth daily. Take 1 daily as needed for extra swelling  . montelukast (SINGULAIR) 10 MG tablet TAKE 1 TABLET (10 MG TOTAL) BY MOUTH AT BEDTIME.  . Multiple Vitamin (MULTIVITAMIN) capsule Take 1 capsule by mouth daily.  . OXYGEN Inhale into the lungs. 24/7 2 lpm  [employer]  . potassium chloride SA  (KLOR-CON M20) 20 MEQ tablet Take 1 tablet (20 mEq total) by mouth daily as needed.  . predniSONE (DELTASONE) 10 MG tablet TAKE 2 TABLETS ON EVEN DAYS AND 1 TABLET ON ODD DAYS - ALTERNATE (Patient taking differently: ALTERNATING BETWEEN 15MG  and 10MG )  . PROAIR HFA 108 (90 BASE) MCG/ACT inhaler INHALE 2 PUFFS BY MOUTH EVERY 4 HOURS AS NEEDED FOR WHEEZING OR FOR SHORTNESS OF BREATH  . ranitidine (ZANTAC) 75 MG tablet Take 300 mg by mouth at bedtime.   Marland Kitchen Respiratory Therapy Supplies (FLUTTER) DEVI Use as directed  . ZETIA 10 MG tablet TAKE 1 TABLET (10 MG TOTAL) BY MOUTH DAILY.  Marland Kitchen acetaminophen (TYLENOL) 325 MG tablet Take as needed per bottle  . Lactase (LACTAID PO) Take by mouth. Take as needed per bottle   Facility-Administered Encounter Medications as of 12/02/2016  Medication  . cyanocobalamin ((VITAMIN B-12)) injection 1,000 mcg    Allergies (verified) Shellfish allergy; Aspirin; Atorvastatin; Crestor [rosuvastatin calcium]; Fish allergy; Penicillins; Simvastatin; and Trandolapril   History: Past Medical History:  Diagnosis Date  . Allergy    allergic rhinitis  . Asthma   . Coronary artery disease    cath January 2011 with DEs LAD and RCA  . Full dentures   . Gallstones   . GERD (gastroesophageal reflux disease)   . History of echocardiogram    Echo  5/17: mod LVH, EF 50-55%, ant-septal HK, Gr 1 DD, mod LAE //  b.  Echo 7/17: mild concentric LVH, EF 45-50%, inf-lat, inf, inf-septal HK, mild LAE  . History of nuclear stress test    Myoview 7/17: EF 48%, small mild apical defect, no ischemia, low risk  . Hyperlipidemia   . Hypertension   . 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 Myopia   . 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 Nocturnal oxygen desaturation    o2 at night  . Obesity   . Osteoarthritis   . Overactive bladder   . Rash    and other non specific skin eruptions  . Urinary incontinence   . Vertigo   . Wears  glasses    Past Surgical History:  Procedure Laterality Date  . CHOLECYSTECTOMY  92  . COLONOSCOPY    . CORONARY ANGIOPLASTY WITH STENT PLACEMENT  07/2009   stent  . DILATION AND CURETTAGE OF UTERUS  1984  . INCISIONAL HERNIA REPAIR N/A 05/16/2013   Procedure: HERNIA REPAIR INFRAUMILICAL INCISIONAL;  Surgeon: Joyice Faster. Cornett, MD;  Location: [place];  Service: General;  Laterality: N/A;  umbilical  . INSERTION OF MESH N/A 05/16/2013   Procedure: INSERTION OF MESH;  Surgeon: Joyice Faster. Cornett, MD;  Location: [place];  Service: General;  Laterality: N/A;  umbilical  . JOINT REPLACEMENT  2007   right total knee replacement  . TOTAL KNEE ARTHROPLASTY  2010   left , then vocal cord infection post op  . VIDEO BRONCHOSCOPY Bilateral 07/05/2013   Procedure: VIDEO BRONCHOSCOPY WITH FLUORO;  Surgeon: Tanda Rockers, MD;  Location: [facility];  Service: Cardiopulmonary;  Laterality: Bilateral;  . vocal cord polypectomy     Family History  Problem Relation Age of Onset  . Stroke Mother   . Diabetes Mother 69  . Stroke Father   . Breast cancer Maternal Grandmother   . Colon cancer Neg Hx    Social History   Occupational History  . Not on file.   Social History Main Topics  . Smoking status: Former Smoker    Packs/day: 1.00    Years: 25.00    Types: Cigarettes    Quit date: 07/27/1982  . Smokeless tobacco: Never Used  . Alcohol use 0.0 oz/week     Comment: wine-rare  . Drug use: No  . Sexual activity: Not on file    Tobacco Counseling Counseling given: No   Activities of Daily Living In your present state of health, do you have any difficulty performing the following activities: 12/02/2016  Hearing? N  Vision? Y  Difficulty concentrating or making decisions? N  Walking or climbing stairs? Y  Dressing or bathing? N  Doing errands, shopping? N  Preparing Food and eating ? N  Using the Toilet? N  In the past six months, have you  accidently leaked urine? Y  Do you have problems with loss of bowel control? N  Managing your Medications? N  Managing your Finances? N  Housekeeping or managing your Housekeeping? N  Some recent data might be hidden    Immunizations and Health Maintenance Immunization History  Administered Date(s) Administered  . Influenza Split 06/16/2011  . Influenza Whole 05/12/2005, 05/31/2007, 05/07/2008, 05/15/2010  . Influenza,inj,Quad PF,36+ Mos 04/03/2013, 04/18/2014, 03/29/2015, 04/30/2016  . Influenza-Unspecified 05/23/2012  . Pneumococcal Conjugate-13 10/09/2014  . Pneumococcal Polysaccharide-23 11/27/2009  . Td 07/22/2005  There are no preventive care reminders to display for this patient.  Patient Care Team: Tower, Wynelle Fanny, MD as PCP - General     Assessment:   This is a routine wellness examination for [facility].   Hearing/Vision screen  Hearing Screening   125Hz  250Hz  500Hz  1000Hz  2000Hz  3000Hz  4000Hz  6000Hz  8000Hz   Right ear:   0 40 40  40    Left ear:   0 0 40  40    Vision Screening Comments: Last vision exam in May 2018 with Dr. Wallace Going  Dietary issues and exercise activities discussed: Current Exercise Habits: The patient does not participate in regular exercise at present, Exercise limited by: respiratory conditions(s)  Goals    . lose weight          Starting 12/02/2016, I will continue to practice portion control in an effort to lose weight.       Depression Screen PHQ 2/9 Scores 12/02/2016 07/13/2016  PHQ - 2 Score 2 0    Fall Risk Fall Risk  12/02/2016 07/13/2016  Falls in the past year? No No    Cognitive Function: MMSE - Mini Mental State Exam 12/02/2016  Orientation to time 5  Orientation to Place 5  Registration 3  Attention/ Calculation 0  Recall 2  Recall-comments pt was unable to recall 1 of 3 words  Language- name 2 objects 0  Language- repeat 1  Language- follow 3 step command 2  Language- follow 3 step command-comments pt was unable to  follow 1 step of 3 step command  Language- read & follow direction 0  Write a sentence 0  Copy design 0  Total score 18     PLEASE NOTE: A Mini-Cog screen was completed. Maximum score is 20. A value of 0 denotes this part of [place] MMSE was not completed or the patient failed this part of the Mini-Cog screening.   Mini-Cog Screening Orientation to Time - Max 5 pts Orientation to Place - Max 5 pts Registration - Max 3 pts Recall - Max 3 pts Language Repeat - Max 1 pts Language Follow 3 Step Command - Max 3 pts     Screening Tests Health Maintenance  Topic Date Due  . TETANUS/TDAP  07/21/2025 (Originally 07/23/2015)  . INFLUENZA VACCINE  02/24/2017  . MAMMOGRAM  03/06/2017  . DEXA SCAN  Completed  . PNA vac Low Risk Adult  Completed      Plan:     I have personally reviewed and addressed the Medicare Annual Wellness questionnaire and have noted the following in the patient's chart:  A. Medical and social history B. Use of alcohol, tobacco or illicit drugs  C. Current medications and supplements D. Functional ability and status E.  Nutritional status F.  Physical activity G. Advance directives H. List of other physicians I.  Hospitalizations, surgeries, and ER visits in previous 12 months J.  [place] to include hearing, vision, cognitive, depression L. Referrals and appointments - none  In addition, I have reviewed and discussed with patient certain preventive protocols, quality metrics, and best practice recommendations. A written personalized care plan for preventive services as well as general preventive health recommendations were provided to patient.  See attached scanned questionnaire for additional information.   Signed,   Lindell Noe, MHA, BS, LPN Health Coach

## 2016-12-02 NOTE — Assessment & Plan Note (Signed)
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

## 2016-12-02 NOTE — Assessment & Plan Note (Signed)
Suspect multifactorial- with chronic resp illness/ poor sleep/prednisone treatment  Will ref for counseling Disc poss of sleep apnea -will d/w her pulmonologist at next visit  Labs if no improvement Disc gradually inc activity as tolerated

## 2016-12-02 NOTE — Progress Notes (Signed)
PCP notes:   Health maintenance:  Tetanus - postponed/insurance  Abnormal screenings:   Hearing - failed Depression score: 2; pt discussing emotional health with PCP at next appt Mini-Cog score: 18/20  Patient concerns:   Pt has pending cataract surgery.   Nurse concerns:  None  Next PCP appt:   12/02/16 @ 1400  I reviewed health advisor's note, was available for consultation, and agree with documentation and plan. Loura Pardon MD

## 2016-12-02 NOTE — Progress Notes (Signed)
Subjective:    Patient ID: Kelli Velasquez, female    DOB: 04/07/1941, 76 y.o.   MRN: [id number]  HPI Here for generalized fatigue and malaise  Thinks she is depressed  Exhausted  Sleeps all night (wakes up a few times) Then get up and have breakfast- dozes in lazy boy through the day  She thinks it is due to the prednisone   She has not had a sleep study   Does not snore that she knows of (stopped after 02)   She has also has mood swings -she blames it on the prednisone  Feels frustrated because she does not feel well (ongoing for 4 years) Irritability     Had her AMW today Failed hearing  18/20 on cog screen  satting 92% with 3L with ambulation   Cataract surgery due in June   Wt Readings from Last 3 Encounters:  12/02/16 262 lb 12 oz (119.2 kg)  12/02/16 262 lb 12 oz (119.2 kg)  11/20/16 255 lb (115.7 kg)  per pt she gained from 67  bmi is 44.0  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 Obesity hypoventilation syndrome)  Had a check last week -doing better  Breathing is not getting better -she is ok with 02 at rest  She is sob with any exertion at all  She is on 15/ 10 alternating dose of prednisone - one more week  Then will go down 5 /10 alternating  Fingers crossed that symptoms will not come back   bp is stable today  No cp or palpitations or headaches or edema  No side effects to medicines  BP Readings from Last 3 Encounters:  12/02/16 140/80  12/02/16 140/80  11/20/16 (!) 144/84     Hx of B12 def Lab Results  Component Value Date   VITAMINB12 645 01/06/2016   Hx of hyperglycemia Lab Results  Component Value Date   HGBA1C 5.1 01/06/2016    Taking nexium 40 mg in am 300 mg zantac at night  Controlling her gerd   Patient Active Problem List   Diagnosis Date Noted  . Depressed mood 12/02/2016  . Routine general medical examination at a health care facility 12/30/2015  . Supraumbilical  hernia 02/40/9735  . Lump in the abdomen 04/10/2015  . GERD (gastroesophageal reflux disease) 10/10/2014  . B12 deficiency 10/09/2014  . Skin lesions 10/09/2014  . Cataracts, bilateral 10/09/2014  . Dry eyes 10/09/2014  . Fatigue 05/08/2014  . Pedal edema 05/08/2014  . Shoulder tendonitis 01/24/2014  . Osteopenia 11/28/2013  . Estrogen deficiency 11/02/2013  . Dyspnea 06/27/2013  . Hernia, incisional 04/03/2013  . 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 Cough 12/18/2012  . Chronic respiratory failure with hypoxia and hypercapnia ([place]) 10/26/2012  . Special screening for malignant neoplasms, colon 06/16/2011  . DYSPHONIA 10/10/2010  . HYPERGLYCEMIA 05/27/2010  . CAD, NATIVE VESSEL 08/19/2009  . 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 Severe obesity (BMI >= 40) ([place]) 09/01/2007  . MYOPIA 03/03/2007  . Hyperlipidemia 10/15/2006  . Essential hypertension 10/15/2006  . Allergic rhinitis 10/15/2006  . Cough variant asthma vs UACS  10/15/2006  . OVERACTIVE BLADDER 10/15/2006  . OSTEOARTHRITIS 10/15/2006  . Urinary incontinence 10/15/2006   Past Medical History:  Diagnosis Date  . Allergy    allergic rhinitis  . Asthma   . Coronary artery disease    cath January 2011 with DEs LAD  and RCA  . Full dentures   . Gallstones   . GERD (gastroesophageal reflux disease)   . History of echocardiogram    Echo 5/17: mod LVH, EF 50-55%, ant-septal HK, Gr 1 DD, mod LAE //  b.  Echo 7/17: mild concentric LVH, EF 45-50%, inf-lat, inf, inf-septal HK, mild LAE  . History of nuclear stress test    Myoview 7/17: EF 48%, small mild apical defect, no ischemia, low risk  . Hyperlipidemia   . Hypertension   . Myocardial infarction ([place])    subendocardial, initial episode  . Myopia   . 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 Nocturnal oxygen desaturation    o2 at night  . Obesity   . Osteoarthritis   . Overactive bladder   . Rash      and other non specific skin eruptions  . Urinary incontinence   . Vertigo   . Wears glasses    Past Surgical History:  Procedure Laterality Date  . CHOLECYSTECTOMY  92  . COLONOSCOPY    . CORONARY ANGIOPLASTY WITH STENT PLACEMENT  07/2009   stent  . DILATION AND CURETTAGE OF UTERUS  1984  . INCISIONAL HERNIA REPAIR N/A 05/16/2013   Procedure: HERNIA REPAIR INFRAUMILICAL INCISIONAL;  Surgeon: Joyice Faster. Cornett, MD;  Location: [place];  Service: General;  Laterality: N/A;  umbilical  . INSERTION OF MESH N/A 05/16/2013   Procedure: INSERTION OF MESH;  Surgeon: Joyice Faster. Cornett, MD;  Location: [place];  Service: General;  Laterality: N/A;  umbilical  . JOINT REPLACEMENT  2007   right total knee replacement  . TOTAL KNEE ARTHROPLASTY  2010   left , then vocal cord infection post op  . VIDEO BRONCHOSCOPY Bilateral 07/05/2013   Procedure: VIDEO BRONCHOSCOPY WITH FLUORO;  Surgeon: Tanda Rockers, MD;  Location: [facility];  Service: Cardiopulmonary;  Laterality: Bilateral;  . vocal cord polypectomy     Social History  Substance Use Topics  . Smoking status: Former Smoker    Packs/day: 1.00    Years: 25.00    Types: Cigarettes    Quit date: 07/27/1982  . Smokeless tobacco: Never Used  . Alcohol use 0.0 oz/week     Comment: wine-rare   Family History  Problem Relation Age of Onset  . Stroke Mother   . Diabetes Mother 7  . Stroke Father   . Breast cancer Maternal Grandmother   . Colon cancer Neg Hx    Allergies  Allergen Reactions  . Shellfish Allergy Anaphylaxis  . Aspirin     REACTION: nausea and vomiting High doses  . Atorvastatin     REACTION: leg pain  . Crestor [Rosuvastatin Calcium] Other (See Comments)    Muscle pain - allergy/intolerance  . Fish Allergy   . Penicillins     Due to mold allergy per pt  . Simvastatin     REACTION: muscle pain  . Trandolapril     REACTION: leg pain   Current Outpatient Prescriptions on  File Prior to Visit  Medication Sig Dispense Refill  . acetaminophen-codeine (TYLENOL #3) 300-30 MG tablet One every 4 hours as needed for cough 40 tablet 0  . amLODipine (NORVASC) 10 MG tablet TAKE 1 TABLET BY MOUTH DAILY. 90 tablet 1  . aspirin 81 MG tablet Take 81 mg by mouth daily.    . chlorpheniramine (CHLOR-TRIMETON) 4 MG tablet 4 mg. Take 1  In the morning and 2  at bedtime    . cromolyn (NASALCROM) 5.2 MG/ACT nasal spray Place 2 sprays into both nostrils daily as needed for allergies.    . Cyanocobalamin (VITAMIN B12) 3000 MCG SUBL Place 1 capsule under the tongue daily.    Marland Kitchen dextromethorphan-guaiFENesin (MUCINEX DM) 30-600 MG 12hr tablet Take 1 tablet by mouth 2 (two) times daily as needed for cough.    . EPIPEN 2-PAK 0.3 MG/0.3ML SOAJ injection INJECT 0.3 MLS (0.3 MG TOTAL) INTO THE MUSCLE ONCE AS NEEDED FOR ALLERGIC REACTION 2 Device 0  . esomeprazole (NEXIUM) 40 MG capsule TAKE 1 CAPSULE (40 MG TOTAL) BY MOUTH DAILY. 30 capsule 2  . furosemide (LASIX) 20 MG tablet Take 1 tablet (20 mg total) by mouth daily. 30 tablet 2  . furosemide (LASIX) 40 MG tablet Take 1 tablet (40 mg total) by mouth daily. Take 1 daily as needed for extra swelling 30 tablet 2  . Lactase (LACTAID PO) Take by mouth. Take as needed per bottle    . montelukast (SINGULAIR) 10 MG tablet TAKE 1 TABLET (10 MG TOTAL) BY MOUTH AT BEDTIME. 30 tablet 5  . Multiple Vitamin (MULTIVITAMIN) capsule Take 1 capsule by mouth daily.    . OXYGEN Inhale into the lungs. 24/7 2 lpm  [employer]    . potassium chloride SA (KLOR-CON M20) 20 MEQ tablet Take 1 tablet (20 mEq total) by mouth daily as needed. 90 tablet 0  . predniSONE (DELTASONE) 10 MG tablet TAKE 2 TABLETS ON EVEN DAYS AND 1 TABLET ON ODD DAYS - ALTERNATE (Patient taking differently: ALTERNATING BETWEEN 15MG  and 10MG ) 100 tablet 1  . PROAIR HFA 108 (90 BASE) MCG/ACT inhaler INHALE 2 PUFFS BY MOUTH EVERY 4 HOURS AS NEEDED FOR WHEEZING OR FOR SHORTNESS OF BREATH 8 Inhaler 5  .  ranitidine (ZANTAC) 75 MG tablet Take 300 mg by mouth at bedtime.     Marland Kitchen Respiratory Therapy Supplies (FLUTTER) DEVI Use as directed 1 each 0  . ZETIA 10 MG tablet TAKE 1 TABLET (10 MG TOTAL) BY MOUTH DAILY. 30 tablet 11  . acetaminophen (TYLENOL) 325 MG tablet Take as needed per bottle     Current Facility-Administered Medications on File Prior to Visit  Medication Dose Route Frequency Provider Last Rate Last Dose  . cyanocobalamin ((VITAMIN B-12)) injection 1,000 mcg  1,000 mcg Intramuscular Q90 days Mahathi Pokorney, Wynelle Fanny, MD   1,000 mcg at 09/30/16 1450    Review of Systems Review of Systems  Constitutional: Negative for fever, appetite change,  and unexpected weight change. pos for fatigue and malaise  Eyes: Negative for pain and visual disturbance.  Respiratory: Negative for cough and pos for exertional shortness of breath.  (and 02 dependence) Cardiovascular: Negative for cp or palpitations    Gastrointestinal: Negative for nausea, diarrhea and constipation.  Genitourinary: Negative for urgency and frequency.  Skin: Negative for pallor or rash   Neurological: Negative for weakness, light-headedness, numbness and headaches.  Hematological: Negative for adenopathy. Does not bruise/bleed easily.  Psychiatric/Behavioral: pos for dysphoric labile mood and irritability, neg for si       Objective:   Physical Exam  Constitutional: She appears well-developed and well-nourished. No distress.  Morbidly obese and well app  Not sob on 02 at rest   HENT:  Head: Normocephalic and atraumatic.  Mouth/Throat: Oropharynx is clear and moist. No oropharyngeal exudate.  Eyes: Conjunctivae and EOM are normal. Pupils are equal, round, and reactive to light. Right eye exhibits no discharge. Left eye exhibits no discharge.  No scleral icterus.  Neck: Normal range of motion. Neck supple. No JVD present. Carotid bruit is not present. No thyromegaly present.  Cardiovascular: Normal rate, regular rhythm, normal  heart sounds and intact distal pulses.  Exam reveals no gallop.   Pulmonary/Chest: Effort normal and breath sounds normal. No respiratory distress. She has no wheezes. She has no rales.  No crackles Harsh bs Good air exch   Sob on exertion  3L 02  Abdominal: Soft. Bowel sounds are normal. She exhibits no distension, no abdominal bruit and no mass. There is no tenderness.  Musculoskeletal: She exhibits no edema.  Lymphadenopathy:    She has no cervical adenopathy.  Neurological: She is alert. She has normal reflexes. No cranial nerve deficit. She exhibits normal muscle tone. Coordination normal.  No tremor   Skin: Skin is warm and dry. No rash noted. No pallor.  Psychiatric: Her speech is normal and behavior is normal. Thought content normal. Her mood appears anxious. Her affect is labile. Her affect is not blunt. Thought content is not paranoid. She exhibits a depressed mood. She expresses no homicidal and no suicidal ideation.  Voices great frustration over health problems           Assessment & Plan:   Problem List Items Addressed This Visit      Cardiovascular and Mediastinum   Essential hypertension    bp in fair control at this time  BP Readings from Last 1 Encounters:  12/02/16 140/80   No changes needed Disc lifstyle change with low sodium diet and exercise          Other   Depressed mood - Primary    Pt has depression symptoms with labile mood and irritability -worsened by chronic prednisone Reviewed stressors/ coping techniques/symptoms/ support sources/ tx options and side effects in detail today She is frustrated by chronic illness/ sob/ limited mobility and obesity in setting of 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 Will ref for counseling  She is not interested in medication at this time  >25 minutes spent in face to face time with patient, >50% spent in counselling or coordination of care       Relevant Orders   Ambulatory referral to Psychology   Fatigue     Suspect multifactorial- with chronic resp illness/ poor sleep/prednisone treatment  Will ref for counseling Disc poss of sleep apnea -will d/w her pulmonologist at next visit  Labs if no improvement Disc gradually inc activity as tolerated        Severe obesity (BMI >= 40) ([place])    Discussed how this problem influences overall health and the risks it imposes  Reviewed plan for weight loss with lower calorie diet (via better food choices and also portion control or program like weight watchers) and exercise building up to or more than 30 minutes 5 days per week including some aerobic activity   Challenging since her activity is limited by sob  Disc starting back with wt watchers

## 2016-12-02 NOTE — Patient Instructions (Addendum)
The next time you see Dr Melvyn Novas -do talk to him about possible sleep apnea  Stay as active as your body and breathing will let you be  Sleep in the position that feels best to you (bed or chair)  Take care of yourself   Let's refer you to a counselor to discuss symptoms of depression (and moodiness)   I have seen some success with weight loss with weight watchers program  Also get your carbs from produce (avoid the middle of the supermarket- bread/pasta/rice/snack foods/chips/crackers- etc)  Then eat lean protein and keep portions controlled

## 2016-12-02 NOTE — Patient Instructions (Signed)
Ms. Schendel , Thank you for taking time to come for your Medicare Wellness Visit. I appreciate your ongoing commitment to your health goals. Please review the following plan we discussed and let me know if I can assist you in the future.   These are the goals we discussed: Goals    . lose weight          Starting 12/02/2016, I will continue to practice portion control in an effort to lose weight.        This is a list of the screening recommended for you and due dates:  Health Maintenance  Topic Date Due  . Tetanus Vaccine  07/21/2025*  . Flu Shot  02/24/2017  . Mammogram  03/06/2017  . DEXA scan (bone density measurement)  Completed  . Pneumonia vaccines  Completed  *Topic was postponed. The date shown is not the original due date.   Preventive Care for Adults  A healthy lifestyle and preventive care can promote health and wellness. Preventive health guidelines for adults include the following key practices.  . A routine yearly physical is a good way to check with your health care provider about your health and preventive screening. It is a chance to share any concerns and updates on your health and to receive a thorough exam.  . Visit your dentist for a routine exam and preventive care every 6 months. Brush your teeth twice a day and floss once a day. Good oral hygiene prevents tooth decay and gum disease.  . The frequency of eye exams is based on your age, health, family medical history, use  of contact lenses, and other factors. Follow your health care provider's ecommendations for frequency of eye exams.  . Eat a healthy diet. Foods like vegetables, fruits, whole grains, low-fat dairy products, and lean protein foods contain the nutrients you need without too many calories. Decrease your intake of foods high in solid fats, added sugars, and salt. Eat the right amount of calories for you. Get information about a proper diet from your health care provider, if necessary.  . Regular  physical exercise is one of the most important things you can do for your health. Most adults should get at least 150 minutes of moderate-intensity exercise (any activity that increases your heart rate and causes you to sweat) each week. In addition, most adults need muscle-strengthening exercises on 2 or more days a week.  Silver Sneakers may be a benefit available to you. To determine eligibility, you may visit the website: www.silversneakers.com or contact program at 3233936507 Mon-Fri between 8AM-8PM.   . Maintain a healthy weight. The body mass index (BMI) is a screening tool to identify possible weight problems. It provides an estimate of body fat based on height and weight. Your health care provider can find your BMI and can help you achieve or maintain a healthy weight.   For adults 20 years and older: ? A BMI below 18.5 is considered underweight. ? A BMI of 18.5 to 24.9 is normal. ? A BMI of 25 to 29.9 is considered overweight. ? A BMI of 30 and above is considered obese.   . Maintain normal blood lipids and cholesterol levels by exercising and minimizing your intake of saturated fat. Eat a balanced diet with plenty of fruit and vegetables. Blood tests for lipids and cholesterol should begin at age 83 and be repeated every 5 years. If your lipid or cholesterol levels are high, you are over 50, or you are at high  risk for heart disease, you may need your cholesterol levels checked more frequently. Ongoing high lipid and cholesterol levels should be treated with medicines if diet and exercise are not working.  . If you smoke, find out from your health care provider how to quit. If you do not use tobacco, please do not start.  . If you choose to drink alcohol, please do not consume more than 2 drinks per day. One drink is considered to be 12 ounces (355 mL) of beer, 5 ounces (148 mL) of wine, or 1.5 ounces (44 mL) of liquor.  . If you are 11-35 years old, ask your health care provider if  you should take aspirin to prevent strokes.  . Use sunscreen. Apply sunscreen liberally and repeatedly throughout the day. You should seek shade when your shadow is shorter than you. Protect yourself by wearing long sleeves, pants, a wide-brimmed hat, and sunglasses year round, whenever you are outdoors.  . Once a month, do a whole body skin exam, using a mirror to look at the skin on your back. Tell your health care provider of new moles, moles that have irregular borders, moles that are larger than a pencil eraser, or moles that have changed in shape or color.

## 2016-12-07 ENCOUNTER — Other Ambulatory Visit: Payer: Self-pay | Admitting: Family Medicine

## 2016-12-10 ENCOUNTER — Other Ambulatory Visit: Payer: Self-pay | Admitting: Family Medicine

## 2016-12-11 NOTE — Discharge Instructions (Signed)
Cataract Surgery, Care After °Refer to this sheet in the next few weeks. These instructions provide you with information about caring for yourself after your procedure. Your health care provider may also give you more specific instructions. Your treatment has been planned according to current medical practices, but problems sometimes occur. Call your health care provider if you have any problems or questions after your procedure. °What can I expect after the procedure? °After the procedure, it is common to have: °· Itching. °· Discomfort. °· Fluid discharge. °· Sensitivity to light and to touch. °· Bruising. °Follow these instructions at home: °Eye Care  °· Check your eye every day for signs of infection. Watch for: °¨ Redness, swelling, or pain. °¨ Fluid, blood, or pus. °¨ Warmth. °¨ Bad smell. °Activity  °· Avoid strenuous activities, such as playing contact sports, for as long as told by your health care provider. °· Do not drive or operate heavy machinery until your health care provider approves. °· Do not bend or lift heavy objects . Bending increases pressure in the eye. You can walk, climb stairs, and do light household chores. °· Ask your health care provider when you can return to work. If you work in a dusty environment, you may be advised to wear protective eyewear for a period of time. °General instructions  °· Take or apply over-the-counter and prescription medicines only as told by your health care provider. This includes eye drops. °· Do not touch or rub your eyes. °· If you were given a protective shield, wear it as told by your health care provider. If you were not given a protective shield, wear sunglasses as told by your health care provider to protect your eyes. °· Keep the area around your eye clean and dry. Avoid swimming or allowing water to hit you directly in the face while showering until told by your health care provider. Keep soap and shampoo out of your eyes. °· Do not put a contact lens  into the affected eye or eyes until your health care provider approves. °· Keep all follow-up visits as told by your health care provider. This is important. °Contact a health care provider if: ° °· You have increased bruising around your eye. °· You have pain that is not helped with medicine. °· You have a fever. °· You have redness, swelling, or pain in your eye. °· You have fluid, blood, or pus coming from your incision. °· Your vision gets worse. °Get help right away if: °· You have sudden vision loss. °This information is not intended to replace advice given to you by your health care provider. Make sure you discuss any questions you have with your health care provider. °Document Released: 01/30/2005 Document Revised: 11/21/2015 Document Reviewed: 05/23/2015 °Elsevier Interactive Patient Education © 2017 [employer] ° ° ° ° °General Anesthesia, Adult, Care After °These instructions provide you with information about caring for yourself after your procedure. Your health care provider may also give you more specific instructions. Your treatment has been planned according to current medical practices, but problems sometimes occur. Call your health care provider if you have any problems or questions after your procedure. °What can I expect after the procedure? °After the procedure, it is common to have: °· Vomiting. °· A sore throat. °· Mental slowness. °It is common to feel: °· Nauseous. °· Cold or shivery. °· Sleepy. °· Tired. °· Sore or achy, even in parts of your body where you did not have surgery. °Follow these instructions at   home: °For at least 24 hours after the procedure:  °· Do not: °¨ Participate in activities where you could fall or become injured. °¨ Drive. °¨ Use heavy machinery. °¨ Drink alcohol. °¨ Take sleeping pills or medicines that cause drowsiness. °¨ Make important decisions or sign legal documents. °¨ Take care of children on your own. °· Rest. °Eating and drinking  °· If you vomit, drink  water, juice, or soup when you can drink without vomiting. °· Drink enough fluid to keep your urine clear or pale yellow. °· Make sure you have little or no nausea before eating solid foods. °· Follow the diet recommended by your health care provider. °General instructions  °· Have a responsible adult stay with you until you are awake and alert. °· Return to your normal activities as told by your health care provider. Ask your health care provider what activities are safe for you. °· Take over-the-counter and prescription medicines only as told by your health care provider. °· If you smoke, do not smoke without supervision. °· Keep all follow-up visits as told by your health care provider. This is important. °Contact a health care provider if: °· You continue to have nausea or vomiting at home, and medicines are not helpful. °· You cannot drink fluids or start eating again. °· You cannot urinate after 8-12 hours. °· You develop a skin rash. °· You have fever. °· You have increasing redness at the site of your procedure. °Get help right away if: °· You have difficulty breathing. °· You have chest pain. °· You have unexpected bleeding. °· You feel that you are having a life-threatening or urgent problem. °This information is not intended to replace advice given to you by your health care provider. Make sure you discuss any questions you have with your health care provider. °Document Released: 10/19/2000 Document Revised: 12/16/2015 Document Reviewed: 06/27/2015 °Elsevier Interactive Patient Education © 2017 [employer]vier Inc. ° °

## 2016-12-13 DIAGNOSIS — R0902 Hypoxemia: Secondary | ICD-10-CM | POA: Diagnosis not present

## 2016-12-16 ENCOUNTER — Ambulatory Visit: Payer: Medicare Other | Admitting: Anesthesiology

## 2016-12-16 ENCOUNTER — Encounter
Admission: RE | Disposition: A | Discharge status: Home / Self Care | Payer: Self-pay | Source: Ambulatory Visit | Attending: Ophthalmology

## 2016-12-16 ENCOUNTER — Ambulatory Visit
Admission: RE | Admit: 2016-12-16 | Discharge: 2016-12-16 | Disposition: A | Discharge status: Home / Self Care | Payer: Medicare Other | Source: Ambulatory Visit | Attending: Ophthalmology | Admitting: Ophthalmology

## 2016-12-16 DIAGNOSIS — I110 Hypertensive heart disease with heart failure: Secondary | ICD-10-CM | POA: Diagnosis not present

## 2016-12-16 DIAGNOSIS — H2513 Age-related nuclear cataract, bilateral: Secondary | ICD-10-CM | POA: Diagnosis not present

## 2016-12-16 DIAGNOSIS — H5703 Miosis: Secondary | ICD-10-CM | POA: Insufficient documentation

## 2016-12-16 DIAGNOSIS — I2510 Atherosclerotic heart disease of native coronary artery without angina pectoris: Secondary | ICD-10-CM | POA: Insufficient documentation

## 2016-12-16 DIAGNOSIS — Z7982 Long term (current) use of aspirin: Secondary | ICD-10-CM | POA: Insufficient documentation

## 2016-12-16 DIAGNOSIS — K219 Gastro-esophageal reflux disease without esophagitis: Secondary | ICD-10-CM | POA: Diagnosis not present

## 2016-12-16 DIAGNOSIS — Z7952 Long term (current) use of systemic steroids: Secondary | ICD-10-CM | POA: Insufficient documentation

## 2016-12-16 DIAGNOSIS — Z9981 Dependence on supplemental oxygen: Secondary | ICD-10-CM | POA: Insufficient documentation

## 2016-12-16 DIAGNOSIS — Z87891 Personal history of nicotine dependence: Secondary | ICD-10-CM | POA: Insufficient documentation

## 2016-12-16 DIAGNOSIS — H2511 Age-related nuclear cataract, right eye: Secondary | ICD-10-CM | POA: Diagnosis not present

## 2016-12-16 DIAGNOSIS — I252 Old myocardial infarction: Secondary | ICD-10-CM | POA: Diagnosis not present

## 2016-12-16 DIAGNOSIS — Z955 Presence of coronary angioplasty implant and graft: Secondary | ICD-10-CM | POA: Insufficient documentation

## 2016-12-16 DIAGNOSIS — Z79899 Other long term (current) drug therapy: Secondary | ICD-10-CM | POA: Insufficient documentation

## 2016-12-16 DIAGNOSIS — I509 Heart failure, unspecified: Secondary | ICD-10-CM | POA: Insufficient documentation

## 2016-12-16 DIAGNOSIS — J449 Chronic obstructive pulmonary disease, unspecified: Secondary | ICD-10-CM | POA: Insufficient documentation

## 2016-12-16 DIAGNOSIS — E7800 Pure hypercholesterolemia, unspecified: Secondary | ICD-10-CM | POA: Insufficient documentation

## 2016-12-16 HISTORY — DX: Hypoxemia: R09.02

## 2016-12-16 HISTORY — DX: Dyspnea, unspecified: R06.00

## 2016-12-16 HISTORY — PX: CATARACT EXTRACTION W/PHACO: SHX586

## 2016-12-16 HISTORY — DX: Edema, unspecified: R60.9

## 2016-12-16 HISTORY — DX: Unspecified chronic bronchitis: J42

## 2016-12-16 SURGERY — PHACOEMULSIFICATION, CATARACT, WITH IOL INSERTION
Anesthesia: Monitor Anesthesia Care | Site: Eye | Laterality: Right | Wound class: Clean

## 2016-12-16 MED ORDER — MIDAZOLAM HCL 2 MG/2ML IJ SOLN
INTRAMUSCULAR | Status: DC | PRN
  Administered 2016-12-16: 2 mg via INTRAVENOUS

## 2016-12-16 MED ORDER — LIDOCAINE HCL (PF) 2 % IJ SOLN
INTRAOCULAR | Status: DC | PRN
  Administered 2016-12-16: 1 mL via INTRAOCULAR

## 2016-12-16 MED ORDER — ARMC OPHTHALMIC DILATING DROPS
1.0000 "application " | OPHTHALMIC | Status: DC | PRN
  Administered 2016-12-16 (×3): 1 via OPHTHALMIC

## 2016-12-16 MED ORDER — NA HYALUR & NA CHOND-NA HYALUR 0.4-0.35 ML IO KIT
PACK | INTRAOCULAR | Status: DC | PRN
  Administered 2016-12-16: 1 mL via INTRAOCULAR

## 2016-12-16 MED ORDER — EPINEPHRINE PF 1 MG/ML IJ SOLN
INTRAOCULAR | Status: DC | PRN
  Administered 2016-12-16: 62 mL via OPHTHALMIC

## 2016-12-16 MED ORDER — BRIMONIDINE TARTRATE-TIMOLOL 0.2-0.5 % OP SOLN
OPHTHALMIC | Status: DC | PRN
  Administered 2016-12-16: 1 [drp] via OPHTHALMIC

## 2016-12-16 MED ORDER — MOXIFLOXACIN HCL 0.5 % OP SOLN
1.0000 [drp] | OPHTHALMIC | Status: DC | PRN
  Administered 2016-12-16 (×3): 1 [drp] via OPHTHALMIC

## 2016-12-16 MED ORDER — FENTANYL CITRATE (PF) 100 MCG/2ML IJ SOLN
INTRAMUSCULAR | Status: DC | PRN
  Administered 2016-12-16: 50 ug via INTRAVENOUS

## 2016-12-16 MED ORDER — MOXIFLOXACIN HCL 0.5 % OP SOLN
OPHTHALMIC | Status: DC | PRN
  Administered 2016-12-16: 0.2 mL via OPHTHALMIC

## 2016-12-16 SURGICAL SUPPLY — 27 items
CANNULA ANT/CHMB 27G (MISCELLANEOUS) ×1 IMPLANT
CANNULA ANT/CHMB 27GA (MISCELLANEOUS) ×3 IMPLANT
CARTRIDGE ABBOTT (MISCELLANEOUS) IMPLANT
GLOVE SURG LX 7.5 STRW (GLOVE) ×4
GLOVE SURG LX STRL 7.5 STRW (GLOVE) ×1 IMPLANT
GLOVE SURG TRIUMPH 8.0 PF LTX (GLOVE) ×3 IMPLANT
GOWN STRL REUS W/ TWL LRG LVL3 (GOWN DISPOSABLE) ×2 IMPLANT
GOWN STRL REUS W/TWL LRG LVL3 (GOWN DISPOSABLE) ×6
LENS IOL TECNIS ITEC 24.0 (Intraocular Lens) ×2 IMPLANT
MARKER SKIN DUAL TIP RULER LAB (MISCELLANEOUS) ×3 IMPLANT
NDL FILTER BLUNT 18X1 1/2 (NEEDLE) ×1 IMPLANT
NDL RETROBULBAR .5 NSTRL (NEEDLE) IMPLANT
NEEDLE FILTER BLUNT 18X 1/2SAF (NEEDLE) ×2
NEEDLE FILTER BLUNT 18X1 1/2 (NEEDLE) ×1 IMPLANT
PACK CATARACT BRASINGTON (MISCELLANEOUS) ×3 IMPLANT
PACK EYE AFTER SURG (MISCELLANEOUS) ×3 IMPLANT
PACK OPTHALMIC (MISCELLANEOUS) ×3 IMPLANT
RING MALYGIN 7.0 (MISCELLANEOUS) IMPLANT
SUT ETHILON 10-0 CS-B-6CS-B-6 (SUTURE)
SUT VICRYL  9 0 (SUTURE)
SUT VICRYL 9 0 (SUTURE) IMPLANT
SUTURE EHLN 10-0 CS-B-6CS-B-6 (SUTURE) IMPLANT
SYR 3ML LL SCALE MARK (SYRINGE) ×3 IMPLANT
SYR 5ML LL (SYRINGE) ×3 IMPLANT
SYR TB 1ML LUER SLIP (SYRINGE) ×3 IMPLANT
WATER STERILE IRR 250ML POUR (IV SOLUTION) ×3 IMPLANT
WIPE NON LINTING 3.25X3.25 (MISCELLANEOUS) ×3 IMPLANT

## 2016-12-16 NOTE — H&P (Signed)
The History and Physical notes are on paper, have been signed, and are to be scanned. The patient remains stable and unchanged from the H&P.   Previous H&P reviewed, patient examined, and there are no changes.  Kelli Velasquez 12/16/2016 9:48 AM

## 2016-12-16 NOTE — Anesthesia Postprocedure Evaluation (Signed)
Anesthesia Post Note  Patient: Kelli Velasquez  Procedure(s) Performed: Procedure(s) (LRB): CATARACT EXTRACTION PHACO AND INTRAOCULAR LENS PLACEMENT (IOC)  right (Right)  Patient location during evaluation: PACU Anesthesia Type: MAC Level of consciousness: awake and alert and oriented Pain management: pain level controlled Vital Signs Assessment: post-procedure vital signs reviewed and stable Respiratory status: spontaneous breathing and nonlabored ventilation Cardiovascular status: stable Postop Assessment: no signs of nausea or vomiting and adequate PO intake Anesthetic complications: no    Estill Batten

## 2016-12-16 NOTE — Anesthesia Procedure Notes (Signed)
Procedure Name: MAC Performed by: Hadessah Grennan Pre-anesthesia Checklist: Patient identified, Emergency Drugs available, Suction available, Timeout performed and Patient being monitored Patient Re-evaluated:Patient Re-evaluated prior to inductionOxygen Delivery Method: Nasal cannula Placement Confirmation: positive ETCO2     

## 2016-12-16 NOTE — Anesthesia Preprocedure Evaluation (Addendum)
Anesthesia Evaluation  Patient identified by MRN, date of birth, ID band Patient awake    Reviewed: Allergy & Precautions, NPO status , Patient's Chart, lab work & pertinent test results  Airway Mallampati: I  TM Distance: >3 FB Neck ROM: Full    Dental no notable dental hx.    Pulmonary shortness of breath, asthma , COPD,  oxygen dependent, former smoker,    Pulmonary exam normal        Cardiovascular hypertension, + CAD, + Past MI, + Cardiac Stents and +CHF  Normal cardiovascular exam     Neuro/Psych  Neuromuscular disease negative psych ROS   GI/Hepatic GERD  Medicated,  Endo/Other    Renal/GU      Musculoskeletal  (+) Arthritis , Osteoarthritis,    Abdominal   Peds  Hematology   Anesthesia Other Findings   Reproductive/Obstetrics                             Anesthesia Physical Anesthesia Plan  ASA: III  Anesthesia Plan: MAC   Post-op Pain Management:    Induction: Intravenous  Airway Management Planned:   Additional Equipment:   Intra-op Plan:   Post-operative Plan:   Informed Consent: I have reviewed the patients History and Physical, chart, labs and discussed the procedure including the risks, benefits and alternatives for the proposed anesthesia with the patient or authorized representative who has indicated his/her understanding and acceptance.     Plan Discussed with: CRNA  Anesthesia Plan Comments:         Anesthesia Quick Evaluation

## 2016-12-16 NOTE — Transfer of Care (Signed)
Immediate Anesthesia Transfer of Care Note  Patient: Kelli Velasquez  Procedure(s) Performed: Procedure(s): CATARACT EXTRACTION PHACO AND INTRAOCULAR LENS PLACEMENT (IOC)  right (Right)  Patient Location: PACU  Anesthesia Type: MAC  Level of Consciousness: awake, alert  and patient cooperative  Airway and Oxygen Therapy: Patient Spontanous Breathing and Patient connected to supplemental oxygen  Post-op Assessment: Post-op Vital signs reviewed, Patient's Cardiovascular Status Stable, Respiratory Function Stable, Patent Airway and No signs of Nausea or vomiting  Post-op Vital Signs: Reviewed and stable  Complications: No apparent anesthesia complications

## 2016-12-16 NOTE — Op Note (Signed)
OPERATIVE NOTE  Kelli Velasquez [id number] 12/16/2016   PREOPERATIVE DIAGNOSIS:    Nuclear Sclerotic Cataract Right eye with miotic pupil.        H25.11  POSTOPERATIVE DIAGNOSIS: Nuclear Sclerotic Cataract Right eye with miotic pupil.          PROCEDURE:  Phacoemusification with posterior chamber intraocular lens placement of the right eye which required pupil stretching with the Malyugin pupil expansion device.  LENS:   Implant Name Type Inv. Item Serial No. Manufacturer Lot No. LRB No. Used  LENS IOL DIOP 24.0 - [id number] Intraocular Lens LENS IOL DIOP 24.0 [id number]   Right 1       ULTRASOUND TIME: 18 % of 1 minutes 23 seconds, CDE 14.6  SURGEON:  Wyonia Hough, MD   ANESTHESIA:  Topical with tetracaine drops and 2% Xylocaine jelly, augmented with 1% preservative-free intracameral lidocaine.   COMPLICATIONS:  None.   DESCRIPTION OF PROCEDURE:  The patient was identified in the holding room and transported to the operating room and placed in the supine position under the operating microscope. Theright eye was identified as the operative eye and it was prepped and draped in the usual sterile ophthalmic fashion.   A 1 millimeter clear-corneal paracentesis was made at the 12:00 position.  0.5 ml of preservative-free 1% lidocaine was injected into the anterior chamber. The anterior chamber was filled with Viscoat viscoelastic.  A 2.4 millimeter keratome was used to make a near-clear corneal incision at the 9:00 position. A Malyugin pupil expander was then placed through the main incision and into the anterior chamber of the eye.  The edge of the iris was secured on the lip of the pupil expander and it was released, thereby expanding the pupil to approximately 7 millimeters for completion of the cataract surgery.  Additional Viscoat was placed in the anterior chamber.  A cystotome and capsulorrhexis forceps were used to make a curvilinear capsulorrhexis.   Balanced salt  solution was used to hydrodissect and hydrodelineate the lens nucleus.   Phacoemulsification was used in stop and chop fashion to remove the lens, nucleus and epinucleus.  The remaining cortex was aspirated using the irrigation aspiration handpiece.  Additional Provisc was placed into the eye to distend the capsular bag for lens placement.  A lens was then injected into the capsular bag.  The pupil expanding ring was removed using a Kuglen hook and insertion device. The remaining viscoelastic was aspirated from the capsular bag and the anterior chamber.  The anterior chamber was filled with balanced salt solution to inflate to a physiologic pressure.  Wounds were hydrated with balanced salt solution.  The anterior chamber was inflated to a physiologic pressure with balanced salt solution.  No wound leaks were noted.Vigamox 0.2 ml of a 1mg  per ml solution was injected into the anterior chamber for a dose of 0.2 mg of intracameral antibiotic at the completion of the case. Timolol and Brimonidine drops were applied to the eye.  The patient was taken to the recovery room in stable condition without complications of anesthesia or surgery.  [name] 12/16/2016, 10:47 AM

## 2016-12-18 NOTE — Telephone Encounter (Signed)
Seen [phone] 

## 2016-12-28 ENCOUNTER — Ambulatory Visit (INDEPENDENT_AMBULATORY_CARE_PROVIDER_SITE_OTHER): Payer: 59 | Admitting: Psychology

## 2016-12-28 DIAGNOSIS — F4323 Adjustment disorder with mixed anxiety and depressed mood: Secondary | ICD-10-CM | POA: Diagnosis not present

## 2016-12-29 ENCOUNTER — Ambulatory Visit: Payer: Medicare Other | Admitting: Internal Medicine

## 2016-12-30 ENCOUNTER — Other Ambulatory Visit: Payer: Self-pay | Admitting: Family Medicine

## 2017-01-04 ENCOUNTER — Other Ambulatory Visit: Payer: Self-pay | Admitting: Family Medicine

## 2017-01-06 DIAGNOSIS — H2512 Age-related nuclear cataract, left eye: Secondary | ICD-10-CM | POA: Diagnosis not present

## 2017-01-07 ENCOUNTER — Ambulatory Visit (INDEPENDENT_AMBULATORY_CARE_PROVIDER_SITE_OTHER): Payer: Medicare Other

## 2017-01-07 DIAGNOSIS — E538 Deficiency of other specified B group vitamins: Secondary | ICD-10-CM

## 2017-01-07 MED ORDER — CYANOCOBALAMIN 1000 MCG/ML IJ SOLN
1000.0000 ug | Freq: Once | INTRAMUSCULAR | Status: AC
  Administered 2017-01-07: 1000 ug via INTRAMUSCULAR

## 2017-01-09 ENCOUNTER — Encounter: Payer: Self-pay | Admitting: Family Medicine

## 2017-01-09 LAB — COLOGUARD

## 2017-01-11 ENCOUNTER — Other Ambulatory Visit (INDEPENDENT_AMBULATORY_CARE_PROVIDER_SITE_OTHER): Payer: Medicare Other

## 2017-01-11 ENCOUNTER — Telehealth: Payer: Self-pay | Admitting: Internal Medicine

## 2017-01-11 ENCOUNTER — Ambulatory Visit (INDEPENDENT_AMBULATORY_CARE_PROVIDER_SITE_OTHER)
Admission: RE | Admit: 2017-01-11 | Discharge: 2017-01-11 | Disposition: A | Discharge status: Home / Self Care | Payer: Medicare Other | Source: Ambulatory Visit | Attending: Internal Medicine | Admitting: Internal Medicine

## 2017-01-11 ENCOUNTER — Encounter: Payer: Self-pay | Admitting: Internal Medicine

## 2017-01-11 ENCOUNTER — Ambulatory Visit (INDEPENDENT_AMBULATORY_CARE_PROVIDER_SITE_OTHER): Payer: Medicare Other | Admitting: Internal Medicine

## 2017-01-11 VITALS — BP 120/72 | HR 106 | Ht 65.5 in

## 2017-01-11 DIAGNOSIS — R0609 Other forms of dyspnea: Secondary | ICD-10-CM

## 2017-01-11 DIAGNOSIS — J9612 Chronic respiratory failure with hypercapnia: Secondary | ICD-10-CM | POA: Diagnosis not present

## 2017-01-11 DIAGNOSIS — R918 Other nonspecific abnormal finding of lung field: Secondary | ICD-10-CM | POA: Diagnosis not present

## 2017-01-11 DIAGNOSIS — J9611 Chronic respiratory failure with hypoxia: Secondary | ICD-10-CM

## 2017-01-11 DIAGNOSIS — R05 Cough: Secondary | ICD-10-CM | POA: Diagnosis not present

## 2017-01-11 LAB — CBC WITH DIFFERENTIAL/PLATELET
BASOS ABS: 0.1 10*3/uL (ref 0.0–0.1)
Basophils Relative: 0.4 % (ref 0.0–3.0)
Eosinophils Absolute: 0.1 10*3/uL (ref 0.0–0.7)
Eosinophils Relative: 0.8 % (ref 0.0–5.0)
HCT: 38.5 % (ref 36.0–46.0)
HEMOGLOBIN: 12.6 g/dL (ref 12.0–15.0)
LYMPHS ABS: 1.1 10*3/uL (ref 0.7–4.0)
Lymphocytes Relative: 7.3 % — ABNORMAL LOW (ref 12.0–46.0)
MCHC: 32.6 g/dL (ref 30.0–36.0)
MCV: 91.9 fl (ref 78.0–100.0)
MONO ABS: 1.5 10*3/uL — AB (ref 0.1–1.0)
MONOS PCT: 9.9 % (ref 3.0–12.0)
NEUTROS PCT: 81.6 % — AB (ref 43.0–77.0)
Neutro Abs: 12 10*3/uL — ABNORMAL HIGH (ref 1.4–7.7)
Platelets: 228 10*3/uL (ref 150.0–400.0)
RBC: 4.19 Mil/uL (ref 3.87–5.11)
RDW: 16.4 % — ABNORMAL HIGH (ref 11.5–15.5)
WBC: 14.7 10*3/uL — AB (ref 4.0–10.5)

## 2017-01-11 LAB — BASIC METABOLIC PANEL
BUN: 14 mg/dL (ref 6–23)
CO2: 40 mEq/L — ABNORMAL HIGH (ref 19–32)
Calcium: 9.3 mg/dL (ref 8.4–10.5)
Chloride: 96 mEq/L (ref 96–112)
Creatinine, Ser: 0.8 mg/dL (ref 0.40–1.20)
GFR: 73.94 mL/min (ref >60.00)
Glucose, Bld: 152 mg/dL — ABNORMAL HIGH (ref 70–99)
POTASSIUM: 3.8 meq/L (ref 3.5–5.1)
Sodium: 139 mEq/L (ref 135–145)

## 2017-01-11 LAB — BRAIN NATRIURETIC PEPTIDE: PRO B NATRI PEPTIDE: 264 pg/mL — AB (ref 0.0–100.0)

## 2017-01-11 LAB — SEDIMENTATION RATE: SED RATE: 45 mm/h — AB (ref 0–30)

## 2017-01-11 LAB — TSH: TSH: 0.96 u[IU]/mL (ref 0.35–4.50)

## 2017-01-11 NOTE — Progress Notes (Signed)
LMTCB

## 2017-01-11 NOTE — Telephone Encounter (Signed)
CXR: Notes recorded by Tanda Rockers, MD on 01/11/2017 at 1:46 PM EDT Call pt: Reviewed cxr and no acute change so no change in recommendations made at 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

## 2017-01-11 NOTE — Patient Instructions (Addendum)
Please remember to go to the lab and x-ray department downstairs in the basement  for your tests - we will call you with the results when they are available.  Prednisone 10 mg daily until better or otherwise notified and then when better 10/5 alternating days   See Tammy NP w/in 2 weeks (or next slot available after that) with all your medications, even over the counter meds, separated in two separate bags, the ones you take no matter what vs the ones you stop once you feel better and take only as needed when you feel you need them.   Tammy  will generate for you a new user friendly medication calendar that will put [place] all on the same page re: your medication use.     Without this process, it simply isn't possible to assure that we are providing  your outpatient care  with  the attention to detail we feel you deserve.   If we cannot assure that you're getting that kind of care,  then we cannot manage your problem effectively from this clinic.  Once you have seen Tammy and we are sure that we're all on the same page with your medication use she will arrange follow up with me.

## 2017-01-11 NOTE — Progress Notes (Signed)
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 Follow-up    Pt had PET scan done this am. Her cough and SOB have improved since the last visit.   saba inhaler helps some/ min mucoid sputum, temp to 99 but no rigors, no purulent sputum or hemoptysis/ does have some wt. Loss, no sweating.  rec Dulera 100 Take 2 puffs first thing in am and then another 2 puffs about 12 hours later.  Prednisone 10 mg take  4 each am x 2 days,   2 each am x 2 days,  1 each am x 2 days and stop Only use your albuterol as a rescue medication  We will arrange 24/7 02 at 3lpm per advanced  We will call to arrange a biopsy of the easiest area once I discuss this with radiology  Late add: Discussed in detail all the  indications, usual  risks and alternatives  relative to the benefits with patient who agrees to proceed with bronchoscopy with biopsy.   FOB > done 07/05/2013 > nl airways > neg afb/fungus/path  - IR Bx 07/11/2013 > inflammatory, special stains pending but does not look like tb or fungus and responds short course pred per pt > rec prednisone x 6 days only and needs VATS bx but declines as of 07/13/2013 > rec'd final path from [place] review > c/w BOOP though can't exclude asp/infection sources    11/04/2015  f/u ov/Daphne Karrer re: BOOP/ cough variant asthma  10/5  Plus flovent 110/ singulair plus nexium ac and zantact hs Chief Complaint  Patient presents with  . Follow-up    Breathing is overall doing well. She has occ wheeze in the am. She is using rescue inhaler  2 x per wk on average.   cough/ rhinitis seasonal pattern much better on singulair per Dr Glori Bickers x 2 weeks   No  sob with adl's but not pushing herself much at this point  - waiting for the pool to open, limited by L hip  rec Try prednisone 5 mg daily x 2 week, then 5 mg every other day x 2 weeks, then pred 5 mg every third day x 2 weeks and stop At any point if worsen > go back to the previous dose that worked   Never got off pred completely and Starting around May 27 2016 10 x 3 week then 10/5 but in meantime "caught cold" from son onset 06/08/16  and after 10/5 x a few days  then back to 20/10     09/28/2016  f/u ov/Dashun Borre re: BOOP/ pred 20 mg daily (tol 20/10 just 10 days then worse) / maint singulair/ prn saba for asthma component  Chief Complaint  Patient presents with  . Follow-up    pt c/o stable sob with exertion.  denies cough, mucus production.    back to baseline doe = MMRC3 = can't walk 100 yards even at a slow pace at a flat grade s stopping due to sob  rec Try prednisone 15 mg daily until return  See calendar for specific medication instructions    11/20/2016 acute extended ov/Twilla Khouri re:  BOOP/ pred 20/15 alternating and 2lpm 24/7 and increased to 3lpm to control Chief Complaint  Patient presents with  . Acute Visit    Pt c/o increased SOB, cough, and fatigue for the past 1-2 wks. Her cough is non prod. She states she wakes up in the am very tired.    gradually sob /cough worse p eating / eats 10 pm, no candy handy and having lots of throat clearing/ sensation of daytime pnds / no better with saba  Doe now = MMRC3 = can't walk 100 yards even at a slow pace at a flat grade s stopping due to sob   rec Please remember to go to the lab and x-ray department downstairs in the basement  for your tests - we will call you with the results when they are available.  For drainage / throat tickle try take CHLORPHENIRAMINE  4 mg - take one every 4 hours as needed GERD  Keep your previous  appt  Add :  Consider challenging with symbicort to see if helps cough/ systemic steroid dep    01/11/2017  f/u ov/Ladaisha Portillo re:  BOOP/ steroid dep  -  No med calendar  Chief Complaint  Patient presents with  . Follow-up    3 month follow up. Patient states she is not feeling well. Feeling congested. Coughing. States she is not able to walk 51ft on 2L of o2 before feeling out of breath.   gradually tapered pred from 20/15  Started feeling worse when reduced pred  to 5 mg daily x 3 weeks prior to OV  And weak since. Bad chills x 2 x  1-2 days prior to OV  But s excess/ purulent sputum or mucus plugs  And none in last 24h  No obvious day to day or daytime variability or assoc sob  or hemoptysis or cp or chest tightness, subjective wheeze or overt sinus or hb symptoms. No unusual exp hx or h/o childhood pna/ asthma or knowledge of premature birth.  Sleeping ok without nocturnal  or early am exacerbation  of respiratory  c/o's or need for noct saba. Also denies any obvious fluctuation of symptoms with weather or environmental changes or other aggravating or alleviating factors except as outlined above   Current Medications, Allergies, Complete Past Medical History, Past Surgical History, Family History, and Social History were reviewed in [employer] record.  ROS  The following are not active complaints unless bolded sore throat, dysphagia, dental problems, itching, sneezing,  nasal congestion or excess/ purulent secretions, ear ache,   fever, chills, sweats, unintended wt loss, classically pleuritic or exertional cp,  orthopnea pnd or leg swelling, presyncope, palpitations, abdominal pain, anorexia, nausea, vomiting, diarrhea  or change in bowel or bladder habits, change in stools or urine, dysuria,hematuria,  rash, arthralgias, visual complaints, headache, numbness, weakness or ataxia or problems with walking or coordination,  change in mood/affect or memory.                              PMH:  -Hx of NSTEMI.07/2009 w/  cardiac cath s/p 2 stents in LAD/ RCA       >>Echo at that time showed Mild LVH, EF of 55% and grade 1 diastolic dysfuntion  -Hyperlipidemia -HTN  -OA -s/p bilat. Knee replacements - Obesity  SH:  Retired from [employer] work Former smoker -quit 1984 -smoked x 20y x 1 PPD  Married  No pets  No unusual hobbies  [place] on vacation frequently (1-10/2012 -traveled to [place], [place] and [place] )           Objective:   Physical Exam 01/31/2013   237  > 07/03/2013  232 > 07/24/2013  231 > 09/06/2013  233 > 04/18/2014 256  10/18/2013  233 > 11/29/2013 238 >  03/07/2014  249 > 05/17/2014  263 > 06/26/2014 261>   10/01/2014  247 > 01/02/2015  253 >  04/16/2015 247 >  04/30/2015  244 > 06/14/2015   246 > 09/23/2015   255 > 11/04/2015 253  >  05/07/2016 259 > 06/23/2016   265  > 09/28/2016   247 > 11/20/2016  255 > 01/11/2017   Declined      GEN: A/Ox3; pleasant , NAD, obese - vital signs reviewed  - Note on arrival 02 sats  93% on 2lpm       HEENT:  [place]/AT,  EACs-clear, TMs-wnl, NOSE-clear drainage  THROAT-clear, no lesions, no postnasal drip or exudate noted. Full dentures    NECK:  Supple w/ fair ROM; no JVD; normal carotid impulses w/o bruits; no thyromegaly or nodules palpated; no lymphadenopathy.    RESP   bs are distant bilaterally /  no crackles - minimal exp rhonchi w/o cough on insp or exp   CARD:  RRR, no m/r/g  , trace   pitting  LE  L > R edema sym  pulses intact, no cyanosis or clubbing.  GI:   Soft & nt; nml bowel sounds; no organomegaly or masses detected.   Musco: Warm bil, no deformities or joint swelling noted.   Neuro: alert, no focal deficits noted.    Skin: Warm, no lesions or rashes    CXR PA and Lateral:   01/11/2017 :    I personally reviewed images and agree with radiology impression as follows:    Chronic interstitial changes. No acute pneumonia nor CHF. Given the patient's pulmonary history, chest CT scanning may be  a useful next imaging step   Labs ordered/ reviewed:      Chemistry      Component Value Date/Time   NA 139 01/11/2017 1053   K 3.8 01/11/2017 1053   CL 96 01/11/2017 1053   CO2 40 (H) 01/11/2017 1053   BUN 14 01/11/2017 1053   CREATININE 0.80 01/11/2017 1053   CREATININE 1.01 (H) 12/16/2015 1142      Component Value Date/Time   CALCIUM 9.3 01/11/2017 1053   ALKPHOS 75 01/06/2016 0855   AST 14 01/06/2016 0855   ALT 14 01/06/2016 0855   BILITOT 0.6 01/06/2016 0855        Lab Results  Component Value Date   WBC 14.7 (H) 01/11/2017   HGB 12.6 01/11/2017   HCT 38.5 01/11/2017   MCV 91.9 01/11/2017   PLT 228.0 01/11/2017          Lab Results  Component Value Date   ESRSEDRATE 45 (H) 01/11/2017   ESRSEDRATE 21 11/20/2016   ESRSEDRATE 17 05/07/2016

## 2017-01-11 NOTE — Progress Notes (Signed)
See phone note from 01/11/17. Will sign off.

## 2017-01-12 ENCOUNTER — Telehealth: Payer: Self-pay | Admitting: Internal Medicine

## 2017-01-12 NOTE — Telephone Encounter (Signed)
Since the issue is muscle  weakness best if PCP involved in PT recs - we can send her to pulmonary rehab if willing but this is not something we rec for home rx

## 2017-01-12 NOTE — Telephone Encounter (Signed)
  Pt is aware of results and voiced her understanding. Pt states she forgot to mention during her OV with MW, if PT would be something he would recommend. Pt states she is developing increased weakness and due to oxygen she is unable to get in the pool.  MW please advise. Thanks.   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

## 2017-01-12 NOTE — Telephone Encounter (Signed)
Spoke with patient with MW's recs. She verbalized understanding. Nothing further needed at time of the call.

## 2017-01-13 DIAGNOSIS — R0902 Hypoxemia: Secondary | ICD-10-CM | POA: Diagnosis not present

## 2017-01-13 NOTE — Assessment & Plan Note (Signed)
11/24/2012 Office walk showed desats 86% on RA  With  spirometry FEV1 nml-73% , ratio 80   11/25/2012 CTA Chest >>neg for PE/ ILD  12/07/12 ONO > Pos 5: 62min  begin O2 2 l/m At bedtime  12/13/2012  >  Repeat 02/01/13 on 2lpm 17min> no change rx  - 01/31/2013  Walked RA x 3 laps @ 185 ft each stopped due to  End of study, no desats  - 07/03/2013 sats 75% RA >  On 3lpm sats 93%  > rx with 24 h 02 at 3lpm   - improved by self monitoring as of 07/24/13 ov so changed rx to 2lpm/24/7 humidified due to nasal dryness - 09/06/2013  Walked 2lpm x  2 laps @ 185 ft each stopped due to legs tired, no desats    - 04/18/2014  Walked RA  @ mod pace x 2 laps @ 185 ft each stopped due to  Hip pain, slow pace, sats 89% at end  - 05/16/2014  [facility] RA  2 laps @ 185 ft each stopped due to sob/fatigue but no desats off pred x 04/10/14  - 04/16/2015  Walked RA x 3 laps @ 185 ft each stopped due to   - ONO RA   10/05/2014 >  02 sat < 89% 213 min so rec resume 2lpm and work on wt loss   - 04/16/2015  Walked RA x 3 laps @ 185 ft each stopped due to  91% End of study, nl pace, no sob or desat - HCO3  01/06/16 = 35    - HCO3  05/07/2016  = 39 assoc with wt gain  - HCO3 01/11/2017     = 40   As of  01/11/2017    02 2lpm at hs and prn to maintain > 90%

## 2017-01-13 NOTE — Assessment & Plan Note (Signed)
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 > discussed with IR, rec FOB > done 07/05/2013 > nl airways > neg afb/fungus/path  - IR Bx 07/11/2013 > inflammatory, special stains pending but does not look like tb or fungus and responds short course pred per pt > rec prednisone x 6 days only and needs VATS bx but declines as of 07/13/2013 > rec'd final path from [place] review > c/w BOOP though can't exclude asp/infection sources - 07/19/2013 notified pt and will return 07/24/13 for cxr/ esr then consider steroid rx (needs crypto serology also) - 07/24/2013 ESR 42 off pred x 3 weeks - 07/24/13 > crypto serology neg/ Anti-CCP neg  - started maint   prednisone 09/06/2013  >>> cxr cleared 10/18/2013 on 20 mg per day > rec taper to 20/10 - 11/28/13 reduced to 10 mg daily  - 03/07/2014 5 mg x 2 weeks then 5 mg qod x 2 weeks and stopped pred  04/10/14  - ESR 04/16/2015  = 24 / recurrent nodules > rec pred 20 mg daily > nodules resolved on f/u cxr 06/14/2015  - 04/30/2015 rec ceiling of 20 and floor of 10/5  - ESR 11/04/2015 =  23  On pred 10/5, rec taper off x 6 weeks if tol> flared in June 2017 so resumed last week in June 2017 20 ceiling/ 10/5 floor - ESR 05/07/2016   =  17 on prednisone 20 mg daily> rec 20/10 > did not tol - 09/28/2016 rec reduce from 20 mg to 15 mg daily   - ESR 11/20/2016  =  21 on pred 20/15 so rec 15 mg daily  - ESR 01/11/2017  =  46 with ? Mild  Flare on pred 5 so rec 10 until better then taper to floor of 10 /5     The goal with a chronic steroid dependent illness is always arriving at the lowest effective dose that controls the disease/symptoms and not accepting a set "formula" which is based on statistics or guidelines that don't always take into account patient  variability or the natural hx of the dz in every individual patient, which may well vary over time.  For now therefore I recommend  the patient maintain  10 mg as ceiling and 10/5 alt days as floor   I had an extended discussion with the patient reviewing all relevant studies completed to date and  lasting 15 to 20 minutes of a 25 minute  visit    Each maintenance medication was reviewed in detail including most importantly the difference between maintenance and prns and under what circumstances the prns are to be triggered using an action plan format that is not reflected in the computer generated alphabetically organized AVS.    Please see AVS for specific instructions unique to this visit that I personally wrote and verbalized to the the pt in detail and then reviewed with pt  by my nurse highlighting any  changes in therapy recommended at today's visit to their plan of care.

## 2017-01-13 NOTE — Assessment & Plan Note (Signed)
Clearly multifactorial but recent worsening mostly related likely to boop and obesity

## 2017-01-13 NOTE — Assessment & Plan Note (Signed)
Clearly this is related to chronic prednisone use but there is no way to taper her below what amounts to slightly greater than physiologic levels of steroids and therefore she will need to work harder on diet and exercise long-term to control this.  Follow up per Primary Care planned

## 2017-01-14 NOTE — Discharge Instructions (Signed)

## 2017-01-25 ENCOUNTER — Encounter: Payer: Self-pay | Admitting: *Deleted

## 2017-01-31 ENCOUNTER — Telehealth: Payer: Self-pay | Admitting: Family Medicine

## 2017-01-31 DIAGNOSIS — E538 Deficiency of other specified B group vitamins: Secondary | ICD-10-CM

## 2017-01-31 DIAGNOSIS — R7309 Other abnormal glucose: Secondary | ICD-10-CM

## 2017-01-31 DIAGNOSIS — Z0000 Encounter for general adult medical examination without abnormal findings: Secondary | ICD-10-CM

## 2017-01-31 NOTE — Telephone Encounter (Signed)
-----   Message from Ellamae Sia sent at 01/29/2017  9:25 AM EDT ----- Regarding: Lab orders for Monday, 7.9.18 Patient is scheduled for CPX labs, please order future labs, Thanks , Karna Christmas

## 2017-02-01 ENCOUNTER — Other Ambulatory Visit (INDEPENDENT_AMBULATORY_CARE_PROVIDER_SITE_OTHER): Payer: Medicare Other

## 2017-02-01 DIAGNOSIS — R7309 Other abnormal glucose: Secondary | ICD-10-CM

## 2017-02-01 DIAGNOSIS — Z0000 Encounter for general adult medical examination without abnormal findings: Secondary | ICD-10-CM

## 2017-02-01 DIAGNOSIS — E538 Deficiency of other specified B group vitamins: Secondary | ICD-10-CM | POA: Diagnosis not present

## 2017-02-01 LAB — LIPID PANEL
Cholesterol: 182 mg/dL (ref 0–200)
HDL: 55.4 mg/dL (ref >39.00)
LDL CALC: 90 mg/dL (ref 0–99)
NONHDL: 126.58
Total CHOL/HDL Ratio: 3
Triglycerides: 182 mg/dL — ABNORMAL HIGH (ref 0.0–149.0)
VLDL: 36.4 mg/dL (ref 0.0–40.0)

## 2017-02-01 LAB — CBC WITH DIFFERENTIAL/PLATELET
BASOS ABS: 0.1 10*3/uL (ref 0.0–0.1)
BASOS PCT: 0.6 % (ref 0.0–3.0)
EOS ABS: 0.2 10*3/uL (ref 0.0–0.7)
Eosinophils Relative: 1.7 % (ref 0.0–5.0)
HCT: 42.1 % (ref 36.0–46.0)
HEMOGLOBIN: 13.4 g/dL (ref 12.0–15.0)
Lymphocytes Relative: 12.5 % (ref 12.0–46.0)
Lymphs Abs: 1.8 10*3/uL (ref 0.7–4.0)
MCHC: 31.8 g/dL (ref 30.0–36.0)
MCV: 92.6 fl (ref 78.0–100.0)
MONO ABS: 0.8 10*3/uL (ref 0.1–1.0)
Monocytes Relative: 5.7 % (ref 3.0–12.0)
Neutro Abs: 11.4 10*3/uL — ABNORMAL HIGH (ref 1.4–7.7)
Neutrophils Relative %: 79.5 % — ABNORMAL HIGH (ref 43.0–77.0)
Platelets: 259 10*3/uL (ref 150.0–400.0)
RBC: 4.54 Mil/uL (ref 3.87–5.11)
RDW: 16.5 % — AB (ref 11.5–15.5)
WBC: 14.3 10*3/uL — AB (ref 4.0–10.5)

## 2017-02-01 LAB — VITAMIN B12: Vitamin B-12: 462 pg/mL (ref 211–911)

## 2017-02-01 LAB — COMPREHENSIVE METABOLIC PANEL
ALBUMIN: 3.6 g/dL (ref 3.5–5.2)
ALT: 16 U/L (ref 0–35)
AST: 15 U/L (ref 0–37)
Alkaline Phosphatase: 61 U/L (ref 39–117)
BUN: 17 mg/dL (ref 6–23)
CHLORIDE: 97 meq/L (ref 96–112)
CO2: 39 mEq/L — ABNORMAL HIGH (ref 19–32)
CREATININE: 0.87 mg/dL (ref 0.40–1.20)
Calcium: 9.2 mg/dL (ref 8.4–10.5)
GFR: 67.11 mL/min (ref >60.00)
Glucose, Bld: 134 mg/dL — ABNORMAL HIGH (ref 70–99)
Potassium: 4 mEq/L (ref 3.5–5.1)
SODIUM: 143 meq/L (ref 135–145)
Total Bilirubin: 0.4 mg/dL (ref 0.2–1.2)
Total Protein: 6.6 g/dL (ref 6.0–8.3)

## 2017-02-01 LAB — TSH: TSH: 1.61 u[IU]/mL (ref 0.35–4.50)

## 2017-02-01 LAB — HEMOGLOBIN A1C: HEMOGLOBIN A1C: 6.3 % (ref 4.6–6.5)

## 2017-02-02 ENCOUNTER — Ambulatory Visit (INDEPENDENT_AMBULATORY_CARE_PROVIDER_SITE_OTHER): Payer: Medicare Other | Admitting: Family Medicine

## 2017-02-02 ENCOUNTER — Encounter: Payer: Self-pay | Admitting: Family Medicine

## 2017-02-02 VITALS — BP 126/64 | HR 96 | Temp 98.0°F | Ht 64.75 in | Wt 253.0 lb

## 2017-02-02 DIAGNOSIS — M8580 Other specified disorders of bone density and structure, unspecified site: Secondary | ICD-10-CM

## 2017-02-02 DIAGNOSIS — E2839 Other primary ovarian failure: Secondary | ICD-10-CM | POA: Diagnosis not present

## 2017-02-02 DIAGNOSIS — E7800 Pure hypercholesterolemia, unspecified: Secondary | ICD-10-CM | POA: Diagnosis not present

## 2017-02-02 DIAGNOSIS — R4589 Other symptoms and signs involving emotional state: Secondary | ICD-10-CM

## 2017-02-02 DIAGNOSIS — I10 Essential (primary) hypertension: Secondary | ICD-10-CM | POA: Diagnosis not present

## 2017-02-02 DIAGNOSIS — Z0000 Encounter for general adult medical examination without abnormal findings: Secondary | ICD-10-CM

## 2017-02-02 DIAGNOSIS — Z1231 Encounter for screening mammogram for malignant neoplasm of breast: Secondary | ICD-10-CM | POA: Insufficient documentation

## 2017-02-02 DIAGNOSIS — F329 Major depressive disorder, single episode, unspecified: Secondary | ICD-10-CM

## 2017-02-02 DIAGNOSIS — R7309 Other abnormal glucose: Secondary | ICD-10-CM | POA: Diagnosis not present

## 2017-02-02 DIAGNOSIS — E538 Deficiency of other specified B group vitamins: Secondary | ICD-10-CM | POA: Diagnosis not present

## 2017-02-02 DIAGNOSIS — I5042 Chronic combined systolic (congestive) and diastolic (congestive) heart failure: Secondary | ICD-10-CM | POA: Diagnosis not present

## 2017-02-02 MED ORDER — EZETIMIBE 10 MG PO TABS: ORAL_TABLET | ORAL | 3 refills | Status: DC

## 2017-02-02 MED ORDER — ESOMEPRAZOLE MAGNESIUM 40 MG PO CPDR: 40.0000 mg | DELAYED_RELEASE_CAPSULE | Freq: Every day | ORAL | 3 refills | Status: DC

## 2017-02-02 MED ORDER — AMLODIPINE BESYLATE 10 MG PO TABS: 10.0000 mg | ORAL_TABLET | Freq: Every day | ORAL | 3 refills | Status: DC

## 2017-02-02 MED ORDER — POTASSIUM CHLORIDE CRYS ER 20 MEQ PO TBCR: 20.0000 meq | EXTENDED_RELEASE_TABLET | Freq: Every day | ORAL | 3 refills | Status: DC | PRN

## 2017-02-02 MED ORDER — MONTELUKAST SODIUM 10 MG PO TABS: 10.0000 mg | ORAL_TABLET | Freq: Every day | ORAL | 3 refills | Status: DC

## 2017-02-02 NOTE — Progress Notes (Signed)
Subjective:    Patient ID: ILO BEAMON, female    DOB: [birth year], 76 y.o.   MRN: [id number]  HPI Here for health maintenance exam and to review chronic medical problems    Still had pulmonary problems  Dr Melvyn Novas got prednisone down to 5 and she got much worse  Put back on 10 mg - and now 15 mg today- waiting for it to kick in  This is emotionally and physically hard on her   Limits her activity/ exercise  Very frustrating   Had amw on 5/9 Hearing: missed low tones L ear  Depression score 2  (she was ref to psychologist for depressed mood in may)- seeing [place] [facility] 18/20- missed one recall and unable to follow one step of a 3 step command   Wt Readings from Last 3 Encounters:  02/02/17 253 lb (114.8 kg)  12/16/16 246 lb (111.6 kg)  12/02/16 262 lb 12 oz (119.2 kg)  inc wt on prednisone  Taking care of herself the best she can / eating healthy and avoiding fried foods  bmi 42.4  She is interested in physical therapy for exercise   Tetanus shot 12/06- she found out that ins will not pay at all unless there is an injury   dexa 4/15 osteopenia Due for dexa At high risk due to prednisone    Mammogram 8/17 nl - wants [place] to schedule that with dexa  Self breast exam -no lumps  No gyn complaints   Colonoscopy 1/13  Nl with 10 y recall  She sent back the cologuard/declines further colon cancer screening   Zoster status -may be interested in Shingrix when it is available   bp is stable today  No cp or palpitations or headaches or edema  No side effects to medicines  BP Readings from Last 3 Encounters:  02/02/17 126/64  01/11/17 120/72  12/16/16 (!) 182/97     Hyperlipidemia Lab Results  Component Value Date   CHOL 182 02/01/2017   CHOL 166 01/06/2016   CHOL 166 10/09/2014   Lab Results  Component Value Date   HDL 55.40 02/01/2017   HDL 47.70 01/06/2016   HDL 52.00 10/09/2014   Lab Results  Component Value Date   LDLCALC 90 02/01/2017   LDLCALC  89 01/06/2016   LDLCALC 88 10/09/2014   Lab Results  Component Value Date   TRIG 182.0 (H) 02/01/2017   TRIG 147.0 01/06/2016   TRIG 128.0 10/09/2014   Lab Results  Component Value Date   CHOLHDL 3 02/01/2017   CHOLHDL 3 01/06/2016   CHOLHDL 3 10/09/2014   Lab Results  Component Value Date   LDLDIRECT 124.8 09/01/2007   LDLDIRECT 132.2 04/11/2007   LDLDIRECT 171.4 03/03/2007  trig up from sugar    Hyperglycemia/ prednisone dep Lab Results  Component Value Date   HGBA1C 6.3 02/01/2017   This is up from 5.1 No sugar drinks  No sweets at all    Mood /hx of depression Has another counseling appt next month   B12 def Lab Results  Component Value Date   VITAMINB12 462 02/01/2017    Lab Results  Component Value Date   WBC 14.3 (H) 02/01/2017   HGB 13.4 02/01/2017   HCT 42.1 02/01/2017   MCV 92.6 02/01/2017   PLT 259.0 02/01/2017   On prednisone- leukocytosis  Has cataract surgery planned tomorrow  Patient Active Problem List   Diagnosis Date Noted  . Encounter for screening mammogram for  breast cancer 02/02/2017  . Depressed mood 12/02/2016  . Routine general medical examination at a health care facility 12/30/2015  . Supraumbilical hernia 54/65/6812  . Lump in the abdomen 04/10/2015  . GERD (gastroesophageal reflux disease) 10/10/2014  . B12 deficiency 10/09/2014  . Skin lesions 10/09/2014  . Cataracts, bilateral 10/09/2014  . Dry eyes 10/09/2014  . Fatigue 05/08/2014  . Pedal edema 05/08/2014  . Shoulder tendonitis 01/24/2014  . Osteopenia 11/28/2013  . Estrogen deficiency 11/02/2013  . Dyspnea on exertion 06/27/2013  . Hernia, incisional 04/03/2013  . 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 Cough 12/18/2012  . Chronic respiratory failure with hypoxia and hypercapnia ([place]) 10/26/2012  . Special screening for malignant neoplasms, colon 06/16/2011  . DYSPHONIA 10/10/2010  . HYPERGLYCEMIA 05/27/2010  . CAD, NATIVE VESSEL 08/19/2009  .  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 Severe obesity (BMI >= 40) ([place]) 09/01/2007  . MYOPIA 03/03/2007  . Hyperlipidemia 10/15/2006  . Essential hypertension 10/15/2006  . Allergic rhinitis 10/15/2006  . Cough variant asthma vs UACS  10/15/2006  . OVERACTIVE BLADDER 10/15/2006  . OSTEOARTHRITIS 10/15/2006  . Urinary incontinence 10/15/2006   Past Medical History:  Diagnosis Date  . Allergy    allergic rhinitis  . Asthma   . Chronic bronchitis ([place])   . Coronary artery disease    cath January 2011 with DEs LAD and RCA  . Dyspnea   . Full dentures   . Gallstones   . GERD (gastroesophageal reflux disease)   . History of echocardiogram    Echo 5/17: mod LVH, EF 50-55%, ant-septal HK, Gr 1 DD, mod LAE //  b.  Echo 7/17: mild concentric LVH, EF 45-50%, inf-lat, inf, inf-septal HK, mild LAE  . History of nuclear stress test    Myoview 7/17: EF 48%, small mild apical defect, no ischemia, low risk  . Hyperlipidemia   . Hypertension   . Myocardial infarction ([place])    subendocardial, initial episode, 2010 two stents placed  . Myopia   . 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 Nocturnal oxygen desaturation    o2 at night  . Obesity   . Osteoarthritis    knees, fingers, shoulders  . Overactive bladder   . Oxygen deficiency    uses oxygen all day  . Rash    and other non specific skin eruptions  . Retaining fluid    in ankles and feet  . Urinary incontinence   . Vertigo   . Wears glasses    Past Surgical History:  Procedure Laterality Date  . CATARACT EXTRACTION W/PHACO Right 12/16/2016   Procedure: CATARACT EXTRACTION PHACO AND INTRAOCULAR LENS PLACEMENT ([place])  right;  Surgeon: Leandrew Koyanagi, MD;  Location: [facility];  Service: Ophthalmology;  Laterality: Right;  . CHOLECYSTECTOMY  92  . COLONOSCOPY    . CORONARY ANGIOPLASTY WITH STENT PLACEMENT  07/2009   stent  . DILATION AND CURETTAGE OF UTERUS  1984    . INCISIONAL HERNIA REPAIR N/A 05/16/2013   Procedure: HERNIA REPAIR INFRAUMILICAL INCISIONAL;  Surgeon: Joyice Faster. Cornett, MD;  Location: [facility];  Service: General;  Laterality: N/A;  umbilical  . INSERTION OF MESH N/A 05/16/2013   Procedure: INSERTION OF MESH;  Surgeon: Joyice Faster. Cornett, MD;  Location: [facility];  Service: General;  Laterality: N/A;  umbilical  . JOINT REPLACEMENT  2007   right total knee replacement  . TOTAL KNEE ARTHROPLASTY  2010   left , then vocal cord infection post op  . VIDEO BRONCHOSCOPY Bilateral 07/05/2013   Procedure: VIDEO BRONCHOSCOPY WITH FLUORO;  Surgeon: Tanda Rockers, MD;  Location: [facility];  Service: Cardiopulmonary;  Laterality: Bilateral;  . vocal cord polypectomy     Social History  Substance Use Topics  . Smoking status: Former Smoker    Packs/day: 1.00    Years: 25.00    Types: Cigarettes    Quit date: 07/27/1982  . Smokeless tobacco: Never Used  . Alcohol use 0.0 oz/week     Comment: wine-rare   Family History  Problem Relation Age of Onset  . Stroke Mother   . Diabetes Mother 66  . Stroke Father   . Breast cancer Maternal Grandmother   . Colon cancer Neg Hx    Allergies  Allergen Reactions  . Shellfish Allergy Anaphylaxis  . Aspirin     REACTION: nausea and vomiting High doses  . Atorvastatin     REACTION: leg pain  . Crestor [Rosuvastatin Calcium] Other (See Comments)    Muscle pain - allergy/intolerance  . Fish Allergy   . Penicillins     Due to mold allergy per pt  . Simvastatin     REACTION: muscle pain  . Trandolapril     REACTION: leg pain   Current Outpatient Prescriptions on File Prior to Visit  Medication Sig Dispense Refill  . acetaminophen (TYLENOL) 325 MG tablet Take as needed per bottle    . acetaminophen-codeine (TYLENOL #3) 300-30 MG tablet One every 4 hours as needed for cough 40 tablet 0  . amLODipine (NORVASC) 10 MG tablet TAKE 1 TABLET BY MOUTH DAILY. 90  tablet 0  . aspirin 81 MG tablet Take 81 mg by mouth daily.    . chlorpheniramine (CHLOR-TRIMETON) 4 MG tablet 4 mg. Take 1  In the morning and 2 at bedtime    . cromolyn (NASALCROM) 5.2 MG/ACT nasal spray Place 2 sprays into both nostrils daily as needed for allergies.    . Cyanocobalamin (VITAMIN B12) 3000 MCG SUBL Place 1 capsule under the tongue daily.    Marland Kitchen dextromethorphan-guaiFENesin (MUCINEX DM) 30-600 MG 12hr tablet Take 1 tablet by mouth 2 (two) times daily as needed for cough.    . EPIPEN 2-PAK 0.3 MG/0.3ML SOAJ injection INJECT 0.3 MLS (0.3 MG TOTAL) INTO THE MUSCLE ONCE AS NEEDED FOR ALLERGIC REACTION 2 Device 0  . esomeprazole (NEXIUM) 40 MG capsule TAKE 1 CAPSULE (40 MG TOTAL) BY MOUTH DAILY. 30 capsule 5  . ezetimibe (ZETIA) 10 MG tablet TAKE 1 TABLET (10 MG TOTAL) BY MOUTH DAILY. 30 tablet 5  . furosemide (LASIX) 20 MG tablet Take 1 tablet (20 mg total) by mouth daily. 30 tablet 2  . furosemide (LASIX) 40 MG tablet Take 1 tablet (40 mg total) by mouth daily. Take 1 daily as needed for extra swelling 30 tablet 2  . Lactase (LACTAID PO) Take by mouth. Take as needed per bottle    . montelukast (SINGULAIR) 10 MG tablet TAKE 1 TABLET (10 MG TOTAL) BY MOUTH AT BEDTIME. 30 tablet 5  . Multiple Vitamin (MULTIVITAMIN) capsule Take 1 capsule by mouth daily.    . OXYGEN Inhale into the lungs. 24/7 2 lpm  [employer]    . potassium chloride SA (KLOR-CON M20) 20 MEQ tablet Take 1 tablet (20 mEq total) by mouth daily as needed. 90 tablet 0  . PROAIR HFA 108 (90 BASE) MCG/ACT inhaler INHALE 2 PUFFS BY MOUTH EVERY  4 HOURS AS NEEDED FOR WHEEZING OR FOR SHORTNESS OF BREATH 8 Inhaler 5  . ranitidine (ZANTAC) 75 MG tablet Take 300 mg by mouth at bedtime.     Marland Kitchen Respiratory Therapy Supplies (FLUTTER) DEVI Use as directed 1 each 0   Current Facility-Administered Medications on File Prior to Visit  Medication Dose Route Frequency Provider Last Rate Last Dose  . cyanocobalamin ((VITAMIN B-12)) injection  1,000 mcg  1,000 mcg Intramuscular Q90 days Tower, Wynelle Fanny, MD   1,000 mcg at 09/30/16 1450    Review of Systems Review of Systems  Constitutional: Negative for fever, appetite change, fatigue and unexpected weight change.  Eyes: Negative for pain and visual disturbance.  Respiratory: Negative for cough and pos for baseline shortness of breath.  (worse lately) Cardiovascular: Negative for cp or palpitations    Gastrointestinal: Negative for nausea, diarrhea and constipation.  Genitourinary: Negative for urgency and frequency.  Skin: Negative for pallor or rash   Neurological: Negative for weakness, light-headedness, numbness and headaches.  Hematological: Negative for adenopathy. Does not bruise/bleed easily.  Psychiatric/Behavioral: pos for dysphoric and anx mood / neg for symptoms of steroid psychosis         Objective:   Physical Exam  Constitutional: She appears well-developed and well-nourished. No distress.  obese and well appearing  Wearing 02 via [place]  HENT:  Head: Normocephalic and atraumatic.  Right Ear: External ear normal.  Left Ear: External ear normal.  Mouth/Throat: Oropharynx is clear and moist.  Eyes: Conjunctivae and EOM are normal. Pupils are equal, round, and reactive to light. No scleral icterus.  Neck: Normal range of motion. Neck supple. No JVD present. Carotid bruit is not present. No thyromegaly present.  Cardiovascular: Normal rate, regular rhythm, normal heart sounds and intact distal pulses.  Exam reveals no gallop.   Pulmonary/Chest: Effort normal and breath sounds normal. No respiratory distress. She has no wheezes. She exhibits no tenderness.  Harsh bs -distant at bases No rales  No wheeze  On 02 - sob with small exertion today  Abdominal: Soft. Bowel sounds are normal. She exhibits no distension, no abdominal bruit and no mass. There is no tenderness.  Baseline hernia palp in mid abdomen / reducible  Genitourinary: No breast swelling, tenderness,  discharge or bleeding.  Genitourinary Comments: Breast exam: No mass, nodules, thickening, tenderness, bulging, retraction, inflamation, nipple discharge or skin changes noted.  No axillary or clavicular LA.      Musculoskeletal: Normal range of motion. She exhibits no edema or tenderness.  No kyphosis   Lymphadenopathy:    She has no cervical adenopathy.  Neurological: She is alert. She has normal reflexes. No cranial nerve deficit. She exhibits normal muscle tone. Coordination normal.  Skin: Skin is warm and dry. No rash noted. No erythema. No pallor.  Scattered ecchymosis  Thin skin and very dry   Psychiatric: She has a normal mood and affect.  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      Relevant Medications   amLODipine (NORVASC) 10 MG tablet   ezetimibe (ZETIA) 10 MG tablet   Essential hypertension    bp in fair control at this time  BP Readings from Last 1 Encounters:  02/02/17 126/64   No  changes needed Disc lifstyle change with low sodium diet and exercise  Labs reviewed       Relevant Medications   amLODipine (NORVASC) 10 MG tablet   ezetimibe (ZETIA) 10 MG tablet     Musculoskeletal and Integument   Osteopenia    Due for dexa-order done  High risk for OP given prednisone and ppi  No falls or fractures  On ca and D Fall prev discussed         Other   B12 deficiency    Lab Results  Component Value Date   VITAMINB12 462 02/01/2017   Continue current SL B12 supplementation       Depressed mood    Pt continues counseling with Bambi Cottle- doing better  Suspect mood has impacted cognition/concentration- will continue to follow that  Prednisone does promote anxiety  Better outlook lately however  Reviewed stressors/ coping techniques/symptoms/ support  sources/ tx options and side effects in detail today        Encounter for screening mammogram for breast cancer    Scheduled annual screening mammogram Nl breast exam today  Encouraged monthly self exams        Relevant Orders   MM DIGITAL SCREENING BILATERAL   Estrogen deficiency   Relevant Orders   DG Bone Density   HYPERGLYCEMIA    Lab Results  Component Value Date   HGBA1C 6.3 02/01/2017   Due to prednisone disc imp of low glycemic diet and wt loss to prevent DM2  F/u 6 mo with a1c prior        Relevant Orders   Basic metabolic panel   Hemoglobin A1c   Hyperlipidemia    Disc goals for lipids and reasons to control them Rev labs with pt Rev low sat fat diet in detail She will continue to work on diet       Relevant Medications   amLODipine (NORVASC) 10 MG tablet   ezetimibe (ZETIA) 10 MG tablet   Routine general medical examination at a health care facility - Primary    Reviewed health habits including diet and exercise and skin cancer prevention Reviewed appropriate screening tests for age  Also reviewed health mt list, fam hx and immunization status , as well as social and family history   See HPI For cataract surgery tomorrow Prednisone dep-difficult for wt loss dexa and mammogram scheduled Declines colon screening May be candidate for shingrix when available  Labs reviewed      Severe obesity (BMI >= 40) (HCC)    Discussed how this problem influences overall health and the risks it imposes  Reviewed plan for weight loss with lower calorie diet (via better food choices and also portion control or program like weight watchers) and exercise building up to or more than 30 minutes 5 days per week including some aerobic activity   Difficult for pt with dyspnea from BOOP and 02 dep with chronic prednisone Disc ? If she would be a candidate for pulm rehab for exercise /conditioning

## 2017-02-02 NOTE — Assessment & Plan Note (Signed)
Due for dexa-order done  High risk for OP given prednisone and ppi  No falls or fractures  On ca and D Fall prev discussed

## 2017-02-02 NOTE — Assessment & Plan Note (Signed)
Reviewed health habits including diet and exercise and skin cancer prevention Reviewed appropriate screening tests for age  Also reviewed health mt list, fam hx and immunization status , as well as social and family history   See HPI For cataract surgery tomorrow Prednisone dep-difficult for wt loss dexa and mammogram scheduled Declines colon screening May be candidate for shingrix when available  Labs reviewed

## 2017-02-02 NOTE — Assessment & Plan Note (Signed)
Scheduled annual screening mammogram Nl breast exam today  Encouraged monthly self exams   

## 2017-02-02 NOTE — Assessment & Plan Note (Signed)
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

## 2017-02-02 NOTE — Assessment & Plan Note (Signed)
Discussed how this problem influences overall health and the risks it imposes  Reviewed plan for weight loss with lower calorie diet (via better food choices and also portion control or program like weight watchers) and exercise building up to or more than 30 minutes 5 days per week including some aerobic activity   Difficult for pt with dyspnea from BOOP and 02 dep with chronic prednisone Disc ? If she would be a candidate for pulm rehab for exercise /conditioning

## 2017-02-02 NOTE — Assessment & Plan Note (Signed)
Lab Results  Component Value Date   VITAMINB12 462 02/01/2017   Continue current SL B12 supplementation

## 2017-02-02 NOTE — Assessment & Plan Note (Signed)
Lab Results  Component Value Date   HGBA1C 6.3 02/01/2017   Due to prednisone disc imp of low glycemic diet and wt loss to prevent DM2  F/u 6 mo with a1c prior

## 2017-02-02 NOTE — Assessment & Plan Note (Signed)
bp in fair control at this time  BP Readings from Last 1 Encounters:  02/02/17 126/64   No changes needed Disc lifstyle change with low sodium diet and exercise  Labs reviewed

## 2017-02-02 NOTE — Assessment & Plan Note (Signed)
Pt continues counseling with Kelli Velasquez- doing better  Suspect mood has impacted cognition/concentration- will continue to follow that  Prednisone does promote anxiety  Better outlook lately however  Reviewed stressors/ coping techniques/symptoms/ support sources/ tx options and side effects in detail today

## 2017-02-02 NOTE — Patient Instructions (Addendum)
The next time you see Dr Melvyn Novas- ask him if you would be a candidate for cardiopulmonary rehab for exercise (since your exercise is limited by your breathing)  You can also google "chair exercise"   We will refer you for a bone density test and mammogram    We want you to eat a low glycemic index diet as much as possible See the prediabetes handout  Follow up in 6 months

## 2017-02-02 NOTE — Assessment & Plan Note (Signed)
Disc goals for lipids and reasons to control them Rev labs with pt Rev low sat fat diet in detail She will continue to work on diet

## 2017-02-03 ENCOUNTER — Ambulatory Visit
Admission: RE | Admit: 2017-02-03 | Discharge: 2017-02-03 | Disposition: A | Discharge status: Home / Self Care | Payer: Medicare Other | Source: Ambulatory Visit | Attending: Ophthalmology | Admitting: Ophthalmology

## 2017-02-03 ENCOUNTER — Encounter
Admission: RE | Disposition: A | Discharge status: Home / Self Care | Payer: Self-pay | Source: Ambulatory Visit | Attending: Ophthalmology

## 2017-02-03 ENCOUNTER — Ambulatory Visit: Payer: Medicare Other | Admitting: Anesthesiology

## 2017-02-03 DIAGNOSIS — Z9841 Cataract extraction status, right eye: Secondary | ICD-10-CM | POA: Diagnosis not present

## 2017-02-03 DIAGNOSIS — J45909 Unspecified asthma, uncomplicated: Secondary | ICD-10-CM | POA: Insufficient documentation

## 2017-02-03 DIAGNOSIS — Z96653 Presence of artificial knee joint, bilateral: Secondary | ICD-10-CM | POA: Diagnosis not present

## 2017-02-03 DIAGNOSIS — H2512 Age-related nuclear cataract, left eye: Secondary | ICD-10-CM | POA: Diagnosis not present

## 2017-02-03 DIAGNOSIS — R05 Cough: Secondary | ICD-10-CM | POA: Diagnosis not present

## 2017-02-03 DIAGNOSIS — I2510 Atherosclerotic heart disease of native coronary artery without angina pectoris: Secondary | ICD-10-CM | POA: Diagnosis not present

## 2017-02-03 DIAGNOSIS — I252 Old myocardial infarction: Secondary | ICD-10-CM | POA: Diagnosis not present

## 2017-02-03 DIAGNOSIS — M810 Age-related osteoporosis without current pathological fracture: Secondary | ICD-10-CM | POA: Insufficient documentation

## 2017-02-03 DIAGNOSIS — Z87891 Personal history of nicotine dependence: Secondary | ICD-10-CM | POA: Diagnosis not present

## 2017-02-03 DIAGNOSIS — K219 Gastro-esophageal reflux disease without esophagitis: Secondary | ICD-10-CM | POA: Insufficient documentation

## 2017-02-03 DIAGNOSIS — Z9049 Acquired absence of other specified parts of digestive tract: Secondary | ICD-10-CM | POA: Insufficient documentation

## 2017-02-03 DIAGNOSIS — E7800 Pure hypercholesterolemia, unspecified: Secondary | ICD-10-CM | POA: Insufficient documentation

## 2017-02-03 DIAGNOSIS — R7303 Prediabetes: Secondary | ICD-10-CM | POA: Insufficient documentation

## 2017-02-03 DIAGNOSIS — Z955 Presence of coronary angioplasty implant and graft: Secondary | ICD-10-CM | POA: Insufficient documentation

## 2017-02-03 DIAGNOSIS — M1990 Unspecified osteoarthritis, unspecified site: Secondary | ICD-10-CM | POA: Diagnosis not present

## 2017-02-03 DIAGNOSIS — Z85828 Personal history of other malignant neoplasm of skin: Secondary | ICD-10-CM | POA: Insufficient documentation

## 2017-02-03 DIAGNOSIS — I110 Hypertensive heart disease with heart failure: Secondary | ICD-10-CM | POA: Diagnosis not present

## 2017-02-03 DIAGNOSIS — Z91013 Allergy to seafood: Secondary | ICD-10-CM | POA: Diagnosis not present

## 2017-02-03 DIAGNOSIS — H5703 Miosis: Secondary | ICD-10-CM | POA: Insufficient documentation

## 2017-02-03 DIAGNOSIS — R42 Dizziness and giddiness: Secondary | ICD-10-CM | POA: Diagnosis not present

## 2017-02-03 DIAGNOSIS — I509 Heart failure, unspecified: Secondary | ICD-10-CM | POA: Diagnosis not present

## 2017-02-03 DIAGNOSIS — K449 Diaphragmatic hernia without obstruction or gangrene: Secondary | ICD-10-CM | POA: Diagnosis not present

## 2017-02-03 DIAGNOSIS — Z880 Allergy status to penicillin: Secondary | ICD-10-CM | POA: Insufficient documentation

## 2017-02-03 DIAGNOSIS — Z9109 Other allergy status, other than to drugs and biological substances: Secondary | ICD-10-CM | POA: Insufficient documentation

## 2017-02-03 HISTORY — PX: CATARACT EXTRACTION W/PHACO: SHX586

## 2017-02-03 SURGERY — PHACOEMULSIFICATION, CATARACT, WITH IOL INSERTION
Anesthesia: Monitor Anesthesia Care | Laterality: Left | Wound class: Clean

## 2017-02-03 MED ORDER — BRIMONIDINE TARTRATE-TIMOLOL 0.2-0.5 % OP SOLN
OPHTHALMIC | Status: DC | PRN
  Administered 2017-02-03: 1 [drp] via OPHTHALMIC

## 2017-02-03 MED ORDER — LACTATED RINGERS IV SOLN: INTRAVENOUS | Status: DC

## 2017-02-03 MED ORDER — ACETAMINOPHEN 325 MG PO TABS: 325.0000 mg | ORAL_TABLET | ORAL | Status: DC | PRN

## 2017-02-03 MED ORDER — NA HYALUR & NA CHOND-NA HYALUR 0.4-0.35 ML IO KIT
PACK | INTRAOCULAR | Status: DC | PRN
  Administered 2017-02-03: 1 mL via INTRAOCULAR

## 2017-02-03 MED ORDER — MOXIFLOXACIN HCL 0.5 % OP SOLN
1.0000 [drp] | OPHTHALMIC | Status: DC | PRN
  Administered 2017-02-03 (×3): 1 [drp] via OPHTHALMIC

## 2017-02-03 MED ORDER — ACETAMINOPHEN 160 MG/5ML PO SOLN: 325.0000 mg | ORAL | Status: DC | PRN

## 2017-02-03 MED ORDER — FENTANYL CITRATE (PF) 100 MCG/2ML IJ SOLN
INTRAMUSCULAR | Status: DC | PRN
  Administered 2017-02-03: 50 ug via INTRAVENOUS

## 2017-02-03 MED ORDER — ARMC OPHTHALMIC DILATING DROPS
1.0000 "application " | OPHTHALMIC | Status: DC | PRN
  Administered 2017-02-03 (×3): 1 via OPHTHALMIC

## 2017-02-03 MED ORDER — MIDAZOLAM HCL 2 MG/2ML IJ SOLN
INTRAMUSCULAR | Status: DC | PRN
  Administered 2017-02-03: 2 mg via INTRAVENOUS

## 2017-02-03 MED ORDER — LIDOCAINE HCL 2 % IJ SOLN
INTRAMUSCULAR | Status: DC | PRN
  Administered 2017-02-03: 1 mL

## 2017-02-03 MED ORDER — MOXIFLOXACIN HCL 0.5 % OP SOLN
OPHTHALMIC | Status: DC | PRN
  Administered 2017-02-03: .2 mL via OPHTHALMIC

## 2017-02-03 MED ORDER — EPINEPHRINE PF 1 MG/ML IJ SOLN
INTRAOCULAR | Status: DC | PRN
  Administered 2017-02-03: 57 mL via OPHTHALMIC

## 2017-02-03 SURGICAL SUPPLY — 27 items
CANNULA ANT/CHMB 27G (MISCELLANEOUS) ×1 IMPLANT
CANNULA ANT/CHMB 27GA (MISCELLANEOUS) ×3 IMPLANT
CARTRIDGE ABBOTT (MISCELLANEOUS) IMPLANT
GLOVE SURG LX 7.5 STRW (GLOVE) ×2
GLOVE SURG LX STRL 7.5 STRW (GLOVE) ×1 IMPLANT
GLOVE SURG TRIUMPH 8.0 PF LTX (GLOVE) ×3 IMPLANT
GOWN STRL REUS W/ TWL LRG LVL3 (GOWN DISPOSABLE) ×2 IMPLANT
GOWN STRL REUS W/TWL LRG LVL3 (GOWN DISPOSABLE) ×6
LENS IOL TECNIS ITEC 25.0 (Intraocular Lens) ×2 IMPLANT
MARKER SKIN DUAL TIP RULER LAB (MISCELLANEOUS) ×3 IMPLANT
NDL FILTER BLUNT 18X1 1/2 (NEEDLE) ×1 IMPLANT
NDL RETROBULBAR .5 NSTRL (NEEDLE) IMPLANT
NEEDLE FILTER BLUNT 18X 1/2SAF (NEEDLE) ×2
NEEDLE FILTER BLUNT 18X1 1/2 (NEEDLE) ×1 IMPLANT
PACK CATARACT BRASINGTON (MISCELLANEOUS) ×3 IMPLANT
PACK EYE AFTER SURG (MISCELLANEOUS) ×3 IMPLANT
PACK OPTHALMIC (MISCELLANEOUS) ×3 IMPLANT
RING MALYGIN 7.0 (MISCELLANEOUS) ×2 IMPLANT
SUT ETHILON 10-0 CS-B-6CS-B-6 (SUTURE)
SUT VICRYL  9 0 (SUTURE)
SUT VICRYL 9 0 (SUTURE) IMPLANT
SUTURE EHLN 10-0 CS-B-6CS-B-6 (SUTURE) IMPLANT
SYR 3ML LL SCALE MARK (SYRINGE) ×3 IMPLANT
SYR 5ML LL (SYRINGE) ×3 IMPLANT
SYR TB 1ML LUER SLIP (SYRINGE) ×3 IMPLANT
WATER STERILE IRR 250ML POUR (IV SOLUTION) ×3 IMPLANT
WIPE NON LINTING 3.25X3.25 (MISCELLANEOUS) ×3 IMPLANT

## 2017-02-03 NOTE — Transfer of Care (Signed)
Immediate Anesthesia Transfer of Care Note  Patient: Kelli Velasquez  Procedure(s) Performed: Procedure(s) with comments: CATARACT EXTRACTION PHACO AND INTRAOCULAR LENS PLACEMENT ([facility])  Left  Complicated (Left) - Malyugin Uses oxygen   Patient Location: PACU  Anesthesia Type: MAC  Level of Consciousness: awake, alert  and patient cooperative  Airway and Oxygen Therapy: Patient Spontanous Breathing and Patient connected to supplemental oxygen  Post-op Assessment: Post-op Vital signs reviewed, Patient's Cardiovascular Status Stable, Respiratory Function Stable, Patent Airway and No signs of Nausea or vomiting  Post-op Vital Signs: Reviewed and stable  Complications: No apparent anesthesia complications

## 2017-02-03 NOTE — H&P (Signed)
The History and Physical notes are on paper, have been signed, and are to be scanned. The patient remains stable and unchanged from the H&P.   Previous H&P reviewed, patient examined, and there are no changes.  Kelli Velasquez 02/03/2017 9:46 AM

## 2017-02-03 NOTE — Op Note (Signed)
OPERATIVE NOTE  Kelli Velasquez [id number] 02/03/2017  PREOPERATIVE DIAGNOSIS:   Nuclear sclerotic cataract left eye with miotic pupil      H25.12   POSTOPERATIVE DIAGNOSIS:   Nuclear sclerotic cataract left eye with miotic pupil.     PROCEDURE:  Phacoemulsification with posterior chamber intraocular lens implantation of the left eye which required pupil stretching with the Malyugin pupil expansion device   LENS:   Implant Name Type Inv. Item Serial No. Manufacturer Lot No. LRB No. Used  LENS IOL DIOP 25.0 - [id number] Intraocular Lens LENS IOL DIOP 25.0 [id number]   Left 1        ULTRASOUND TIME: 23 % of 1 minutes, 7 seconds.  CDE 15.5   SURGEON:  Wyonia Hough, MD   ANESTHESIA: Topical with tetracaine drops and 2% Xylocaine jelly, augmented with 1% preservative-free intracameral lidocaine.   COMPLICATIONS:  None.   DESCRIPTION OF PROCEDURE:  The patient was identified in the holding room and transported to the operating room and placed in the supine position under the operating microscope.  The left eye was identified as the operative eye and it was prepped and draped in the usual sterile ophthalmic fashion.   A 1 millimeter clear-corneal paracentesis was made at the 1:30 position.  The anterior chamber was filled with Viscoat viscoelastic.  0.5 ml of preservative-free 1% lidocaine was injected into the anterior chamber.  A 2.4 millimeter keratome was used to make a near-clear corneal incision at the 10:30 position.  A Malyugin pupil expander was then placed through the main incision and into the anterior chamber of the eye.  The edge of the iris was secured on the lip of the pupil expander and it was released, thereby expanding the pupil to approximately 8 millimeters for completion of the cataract surgery.  Additional Viscoat was placed in the anterior chamber.  A cystotome and capsulorrhexis forceps were used to make a curvilinear capsulorrhexis.   Balanced salt solution was  used to hydrodissect and hydrodelineate the lens nucleus.   Phacoemulsification was used in stop and chop fashion to remove the lens, nucleus and epinucleus.  The remaining cortex was aspirated using the irrigation aspiration handpiece.  Additional Provisc was placed into the eye to distend the capsular bag for lens placement.  A lens was then injected into the capsular bag.  The pupil expanding ring was removed using a Kuglen hook and insertion device. The remaining viscoelastic was aspirated from the capsular bag and the anterior chamber.  The anterior chamber was filled with balanced salt solution to inflate to a physiologic pressure.   Wounds were hydrated with balanced salt solution.  The anterior chamber was inflated to a physiologic pressure with balanced salt solution.  No wound leaks were noted. Vigamox 0.2 ml of a 1mg  per ml solution was injected into the anterior chamber for a dose of 0.2 mg of intracameral antibiotic at the completion of the case.   Timolol and Brimonidine drops were applied to the eye.  The patient was taken to the recovery room in stable condition without complications of anesthesia or surgery.  Kelli Velasquez 02/03/2017, 10:35 AM

## 2017-02-03 NOTE — Anesthesia Postprocedure Evaluation (Signed)
Anesthesia Post Note  Patient: Kelli Velasquez  Procedure(s) Performed: Procedure(s) (LRB): CATARACT EXTRACTION PHACO AND INTRAOCULAR LENS PLACEMENT ([facility])  Left  Complicated (Left)  Patient location during evaluation: PACU Anesthesia Type: MAC Level of consciousness: awake and alert, oriented and patient cooperative Pain management: pain level controlled Vital Signs Assessment: post-procedure vital signs reviewed and stable Respiratory status: spontaneous breathing, nonlabored ventilation and respiratory function stable Cardiovascular status: blood pressure returned to baseline and stable Postop Assessment: adequate PO intake Anesthetic complications: no    Darrin Nipper

## 2017-02-03 NOTE — Anesthesia Preprocedure Evaluation (Signed)
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   Pulmonary exam normal breath sounds clear to auscultation       Cardiovascular Exercise Tolerance: Good hypertension, + CAD, + Past MI (2010), + Cardiac Stents (x2, on ASA only) and +CHF  Normal cardiovascular exam Rhythm:Regular Rate:Normal     Neuro/Psych negative neurological ROS  negative psych ROS   GI/Hepatic Neg liver ROS, GERD  ,  Endo/Other  Pre-diabetes  Renal/GU negative Renal ROS     Musculoskeletal  (+) Arthritis , Osteoarthritis,    Abdominal   Peds  Hematology negative hematology ROS (+)   Anesthesia Other Findings   Reproductive/Obstetrics                             Anesthesia Physical Anesthesia Plan  ASA: III  Anesthesia Plan: MAC   Post-op Pain Management:    Induction: Intravenous  PONV Risk Score and Plan: 1 and Ondansetron and Dexamethasone  Airway Management Planned:   Additional Equipment:   Intra-op Plan:   Post-operative Plan:   Informed Consent: I have reviewed the patients History and Physical, chart, labs and discussed the procedure including the risks, benefits and alternatives for the proposed anesthesia with the patient or authorized representative who has indicated his/her understanding and acceptance.     Plan Discussed with:   Anesthesia Plan Comments:         Anesthesia Quick Evaluation

## 2017-02-05 ENCOUNTER — Encounter: Payer: Self-pay | Admitting: Adult Health

## 2017-02-12 DIAGNOSIS — R0902 Hypoxemia: Secondary | ICD-10-CM | POA: Diagnosis not present

## 2017-02-19 ENCOUNTER — Other Ambulatory Visit: Payer: Self-pay | Admitting: Internal Medicine

## 2017-02-19 MED ORDER — PREDNISONE 10 MG PO TABS: ORAL_TABLET | ORAL | 0 refills | Status: DC

## 2017-02-19 NOTE — Telephone Encounter (Signed)
Per last OV with MW, the Prednisone dose was backed down to 10mg  and 5 mg alternating days.  Previous Rxs stated to alternate 20mg  with 10mg .   Patient Instructions 01/11/17 Please remember to go to the lab and x-ray department downstairs in the basement  for your tests - we will call you with the results when they are available.  Prednisone 10 mg daily until better or otherwise notified and then when better 10/5 alternating days   See Tammy NP w/in 2 weeks (or next slot available after that) with all your medications, even over the counter meds, separated in two separate bags, the ones you take no matter what vs the ones you stop once you feel better and take only as needed when you feel you need them.   Tammy  will generate for you a new user friendly medication calendar that will put [place] all on the same page re: your medication use.    Without this process, it simply isn't possible to assure that we are providing  your outpatient care  with  the attention to detail we feel you deserve.   If we cannot assure that you're getting that kind of care,  then we cannot manage your problem effectively from this clinic.  Once you have seen Tammy and we are sure that we're all on the same page with your medication use she will arrange follow up with me.    Prednisone refilled for the corrected dose of 10mg  and 5mg  alternating doses. Nothing further needed.

## 2017-02-25 ENCOUNTER — Ambulatory Visit (INDEPENDENT_AMBULATORY_CARE_PROVIDER_SITE_OTHER): Payer: 59 | Admitting: Psychology

## 2017-02-25 DIAGNOSIS — F4323 Adjustment disorder with mixed anxiety and depressed mood: Secondary | ICD-10-CM

## 2017-02-26 ENCOUNTER — Encounter: Payer: Self-pay | Admitting: *Deleted

## 2017-03-15 DIAGNOSIS — R0902 Hypoxemia: Secondary | ICD-10-CM | POA: Diagnosis not present

## 2017-03-16 DIAGNOSIS — Z961 Presence of intraocular lens: Secondary | ICD-10-CM | POA: Diagnosis not present

## 2017-03-17 ENCOUNTER — Ambulatory Visit (INDEPENDENT_AMBULATORY_CARE_PROVIDER_SITE_OTHER): Payer: Medicare Other | Admitting: Cardiovascular Disease

## 2017-03-17 ENCOUNTER — Encounter: Payer: Self-pay | Admitting: Cardiovascular Disease

## 2017-03-17 VITALS — BP 124/70 | HR 92 | Ht 64.75 in | Wt 243.8 lb

## 2017-03-17 DIAGNOSIS — E7800 Pure hypercholesterolemia, unspecified: Secondary | ICD-10-CM

## 2017-03-17 DIAGNOSIS — I2510 Atherosclerotic heart disease of native coronary artery without angina pectoris: Secondary | ICD-10-CM

## 2017-03-17 DIAGNOSIS — I10 Essential (primary) hypertension: Secondary | ICD-10-CM

## 2017-03-17 DIAGNOSIS — I5032 Chronic diastolic (congestive) heart failure: Secondary | ICD-10-CM | POA: Diagnosis not present

## 2017-03-17 NOTE — Progress Notes (Signed)
Chief Complaint  Patient presents with  . Follow-up    CAD    History of Present Illness: 76 yo female with h/o CAD, HLD, chronic combined systolic and diastolic CHF and HTN who is here today for cardiac follow up. She was admitted to [facility] in January 2011 with a NSTEMI. Cardiac cath January 2011 with severe two vessel disease and we placed Promus drug eluting stents in the LAD and the RCA. Beta blocker stopped due to wheezing with bronchitis in 2013. Her lung issues are followed by Dr. Melvyn Novas.  Echo July 2017 with mild reduction in LV systolic function, PYKD=98-33%, no significant valve disease. Nuclear stress test July 2017 with no ischemia. Volume status has been controlled with Lasix.   She is here today for follow up. The patient denies any chest pain, palpitations, lower extremity edema, orthopnea, PND, dizziness, near syncope or syncope.    Primary Care Physician: Tower, Wynelle Fanny, MD  Past Medical History:  Diagnosis Date  . Allergy    allergic rhinitis  . Asthma   . Chronic bronchitis ([place])   . Coronary artery disease    cath January 2011 with DEs LAD and RCA  . Dyspnea   . Full dentures   . Gallstones   . GERD (gastroesophageal reflux disease)   . History of echocardiogram    Echo 5/17: mod LVH, EF 50-55%, ant-septal HK, Gr 1 DD, mod LAE //  b.  Echo 7/17: mild concentric LVH, EF 45-50%, inf-lat, inf, inf-septal HK, mild LAE  . History of nuclear stress test    Myoview 7/17: EF 48%, small mild apical defect, no ischemia, low risk  . Hyperlipidemia   . Hypertension   . Myocardial infarction ([place])    subendocardial, initial episode, 2010 two stents placed  . Myopia   . 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 Nocturnal oxygen desaturation    o2 at night  . Obesity   . Osteoarthritis    knees, fingers, shoulders  . Overactive bladder   . Oxygen deficiency    uses oxygen all day  . Rash    and other non specific skin eruptions  .  Retaining fluid    in ankles and feet  . Urinary incontinence   . Vertigo   . Wears glasses     Past Surgical History:  Procedure Laterality Date  . CATARACT EXTRACTION W/PHACO Right 12/16/2016   Procedure: CATARACT EXTRACTION PHACO AND INTRAOCULAR LENS PLACEMENT ([place])  right;  Surgeon: Leandrew Koyanagi, MD;  Location: [facility];  Service: Ophthalmology;  Laterality: Right;  . CATARACT EXTRACTION W/PHACO Left 02/03/2017   Procedure: CATARACT EXTRACTION PHACO AND INTRAOCULAR LENS PLACEMENT ([place])  Left  Complicated;  Surgeon: Leandrew Koyanagi, MD;  Location: [facility];  Service: Ophthalmology;  Laterality: Left;  [place] Uses oxygen   . CHOLECYSTECTOMY  92  . COLONOSCOPY    . CORONARY ANGIOPLASTY WITH STENT PLACEMENT  07/2009   stent  . DILATION AND CURETTAGE OF UTERUS  1984  . INCISIONAL HERNIA REPAIR N/A 05/16/2013   Procedure: HERNIA REPAIR INFRAUMILICAL INCISIONAL;  Surgeon: Joyice Faster. Cornett, MD;  Location: [facility];  Service: General;  Laterality: N/A;  umbilical  . INSERTION OF MESH N/A 05/16/2013   Procedure: INSERTION OF MESH;  Surgeon: Joyice Faster. Cornett, MD;  Location: [facility];  Service: General;  Laterality: N/A;  umbilical  . JOINT REPLACEMENT  2007   right  total knee replacement  . TOTAL KNEE ARTHROPLASTY  2010   left , then vocal cord infection post op  . VIDEO BRONCHOSCOPY Bilateral 07/05/2013   Procedure: VIDEO BRONCHOSCOPY WITH FLUORO;  Surgeon: Tanda Rockers, MD;  Location: [facility];  Service: Cardiopulmonary;  Laterality: Bilateral;  . vocal cord polypectomy      Current Outpatient Prescriptions  Medication Sig Dispense Refill  . acetaminophen (TYLENOL) 325 MG tablet Take as needed per bottle    . acetaminophen-codeine (TYLENOL #3) 300-30 MG tablet One every 4 hours as needed for cough 40 tablet 0  . amLODipine (NORVASC) 10 MG tablet Take 1 tablet (10 mg total) by mouth daily. 90 tablet 3  .  aspirin 81 MG tablet Take 81 mg by mouth daily.    . chlorpheniramine (CHLOR-TRIMETON) 4 MG tablet 4 mg. Take 1  In the morning and 2 at bedtime    . cromolyn (NASALCROM) 5.2 MG/ACT nasal spray Place 2 sprays into both nostrils daily as needed for allergies.    . Cyanocobalamin (VITAMIN B12) 3000 MCG SUBL Place 1 capsule under the tongue daily.    Marland Kitchen dextromethorphan-guaiFENesin (MUCINEX DM) 30-600 MG 12hr tablet Take 1 tablet by mouth 2 (two) times daily as needed for cough.    . EPIPEN 2-PAK 0.3 MG/0.3ML SOAJ injection INJECT 0.3 MLS (0.3 MG TOTAL) INTO THE MUSCLE ONCE AS NEEDED FOR ALLERGIC REACTION 2 Device 0  . esomeprazole (NEXIUM) 40 MG capsule Take 1 capsule (40 mg total) by mouth daily. 90 capsule 3  . ezetimibe (ZETIA) 10 MG tablet TAKE 1 TABLET (10 MG TOTAL) BY MOUTH DAILY. 90 tablet 3  . furosemide (LASIX) 20 MG tablet Take 1 tablet (20 mg total) by mouth daily. 30 tablet 2  . furosemide (LASIX) 40 MG tablet Take 1 tablet (40 mg total) by mouth daily. Take 1 daily as needed for extra swelling 30 tablet 2  . montelukast (SINGULAIR) 10 MG tablet Take 1 tablet (10 mg total) by mouth at bedtime. 90 tablet 3  . Multiple Vitamin (MULTIVITAMIN) capsule Take 1 capsule by mouth daily.    . OXYGEN Inhale into the lungs. 24/7 2 lpm  [employer]    . potassium chloride SA (KLOR-CON M20) 20 MEQ tablet Take 1 tablet (20 mEq total) by mouth daily as needed. 90 tablet 3  . predniSONE (DELTASONE) 10 MG tablet Take 10mg  tablets until better then alternate 10mg  with 5mg  every other day. 100 tablet 0  . PREDNISONE PO Take 15 mg by mouth.    Marland Kitchen PROAIR HFA 108 (90 BASE) MCG/ACT inhaler INHALE 2 PUFFS BY MOUTH EVERY 4 HOURS AS NEEDED FOR WHEEZING OR FOR SHORTNESS OF BREATH 8 Inhaler 5  . ranitidine (ZANTAC) 75 MG tablet Take 300 mg by mouth at bedtime.      No current facility-administered medications for this visit.     Allergies  Allergen Reactions  . Shellfish Allergy Anaphylaxis  . Aspirin     REACTION:  nausea and vomiting High doses  . Atorvastatin     REACTION: leg pain  . Crestor [Rosuvastatin Calcium] Other (See Comments)    Muscle pain - allergy/intolerance  . Fish Allergy   . Penicillins     Due to mold allergy per pt  . Simvastatin     REACTION: muscle pain  . Trandolapril     REACTION: leg pain    Social History   Social History  . Marital status: Married    Spouse name: N/A  .  Number of children: N/A  . Years of education: N/A   Occupational History  . Not on file.   Social History Main Topics  . Smoking status: Former Smoker    Packs/day: 1.00    Years: 25.00    Types: Cigarettes    Quit date: 07/27/1982  . Smokeless tobacco: Never Used  . Alcohol use 0.0 oz/week     Comment: wine-rare  . Drug use: No  . Sexual activity: Not on file   Other Topics Concern  . Not on file   Social History Narrative  . No narrative on file    Family History  Problem Relation Age of Onset  . Stroke Mother   . Diabetes Mother 8  . Stroke Father   . Heart failure Father   . Breast cancer Maternal Grandmother   . Colon cancer Neg Hx     Review of Systems:  As stated in the HPI and otherwise negative.   BP 124/70   Pulse 92   Ht 5' 4.75" (1.645 m)   Wt 243 lb 12.8 oz (110.6 kg)   LMP 07/27/1980   SpO2 93%   BMI 40.88 kg/m   Physical Examination: General: Well developed, well nourished, NAD  HEENT: OP clear, mucus membranes moist  SKIN: warm, dry. No rashes. Neuro: No focal deficits  Musculoskeletal: Muscle strength 5/5 all ext  Psychiatric: Mood and affect normal  Neck: No JVD, no carotid bruits, no thyromegaly, no lymphadenopathy.  Lungs:Clear bilaterally, no wheezes, rhonci, crackles Cardiovascular: Regular rate and rhythm. No murmurs, gallops or rubs. Abdomen:Soft. Bowel sounds present. Non-tender.  Extremities: No lower extremity edema. Pulses are 2 + in the bilateral DP/PT.  Echo July 2017: - Left ventricle: The cavity size was normal. There was  mild   concentric hypertrophy. Systolic function was mildly reduced. The   estimated ejection fraction was in the range of 45% to 50%.   Hypokinesis of the inferolateral, inferior, and inferoseptal   myocardium. Acoustic contrast opacification revealed no evidence   ofthrombus. - Left atrium: The atrium was mildly dilated  Nuclear stress test July 2017:   Nuclear stress EF: 48%. Mildly asynchronous contraction  There was no ST segment deviation noted during stress.  Defect 1: There is a small defect of mild severity present in the apex location. No ischemia identified  This is a low risk study. Sensitivity and specificity reduced by noted breast attenuation.    EKG:  EKG is ordered today. The ekg ordered today demonstrates sinus, rate 94 bpm. PAC.   Recent Labs: 01/11/2017: Pro B Natriuretic peptide (BNP) 264.0 02/01/2017: ALT 16; BUN 17; Creatinine, Ser 0.87; Hemoglobin 13.4; Platelets 259.0; Potassium 4.0; Sodium 143; TSH 1.61   Lipid Panel    Component Value Date/Time   CHOL 182 02/01/2017 0948   TRIG 182.0 (H) 02/01/2017 0948   HDL 55.40 02/01/2017 0948   CHOLHDL 3 02/01/2017 0948   VLDL 36.4 02/01/2017 0948   LDLCALC 90 02/01/2017 0948   LDLDIRECT 124.8 09/01/2007 0909     Wt Readings from Last 3 Encounters:  03/17/17 243 lb 12.8 oz (110.6 kg)  02/03/17 250 lb (113.4 kg)  02/02/17 253 lb (114.8 kg)     Other studies Reviewed: Additional studies/ records that were reviewed today include: . Review of the above records demonstrates:    Assessment and Plan:   1. CAD without angina: No chest pain suggestive of angina. Nuclear stress test in July 2017 with no ischemia. She is intolerant to statins.  Beta blocker stopped in Pulmonary office due to bronchospasm. Will continue ASA and Zetia.    2.  HYPERTENSION:  BP controlled. No changes.      3. HLD: She does not tolerate statins. She is on Zetia.   4. Chronic systolic and diastolic CHF: Volume status controlled on  Lasix. Weight is stable. No LE edema.    Current medicines are reviewed at length with the patient today.  The patient does not have concerns regarding medicines.  The following changes have been made:  no change  Labs/ tests ordered today include:   Orders Placed This Encounter  Procedures  . EKG 12-Lead    Disposition:   FU with me in 12  months  Signed, Lauree Chandler, MD 03/17/2017 11:48 AM    [facility] [place], [place], [place]  [address] Phone: [phone]; Fax: [phone]

## 2017-03-17 NOTE — Patient Instructions (Signed)

## 2017-03-29 ENCOUNTER — Other Ambulatory Visit: Payer: Self-pay | Admitting: Internal Medicine

## 2017-03-31 ENCOUNTER — Ambulatory Visit
Admission: RE | Admit: 2017-03-31 | Discharge: 2017-03-31 | Disposition: A | Discharge status: Home / Self Care | Payer: Medicare Other | Source: Ambulatory Visit | Attending: Family Medicine | Admitting: Family Medicine

## 2017-03-31 DIAGNOSIS — E2839 Other primary ovarian failure: Secondary | ICD-10-CM

## 2017-03-31 DIAGNOSIS — Z96653 Presence of artificial knee joint, bilateral: Secondary | ICD-10-CM | POA: Diagnosis not present

## 2017-03-31 DIAGNOSIS — M85852 Other specified disorders of bone density and structure, left thigh: Secondary | ICD-10-CM | POA: Diagnosis not present

## 2017-03-31 DIAGNOSIS — Z780 Asymptomatic menopausal state: Secondary | ICD-10-CM | POA: Diagnosis not present

## 2017-03-31 DIAGNOSIS — Z1231 Encounter for screening mammogram for malignant neoplasm of breast: Secondary | ICD-10-CM

## 2017-03-31 DIAGNOSIS — J45909 Unspecified asthma, uncomplicated: Secondary | ICD-10-CM | POA: Insufficient documentation

## 2017-04-13 ENCOUNTER — Telehealth: Payer: Self-pay

## 2017-04-13 ENCOUNTER — Ambulatory Visit (INDEPENDENT_AMBULATORY_CARE_PROVIDER_SITE_OTHER): Payer: Medicare Other

## 2017-04-13 DIAGNOSIS — E538 Deficiency of other specified B group vitamins: Secondary | ICD-10-CM | POA: Diagnosis not present

## 2017-04-13 MED ORDER — CYANOCOBALAMIN 1000 MCG/ML IJ SOLN
1000.0000 ug | Freq: Once | INTRAMUSCULAR | Status: AC
  Administered 2017-04-13: 1000 ug via INTRAMUSCULAR

## 2017-04-13 NOTE — Telephone Encounter (Signed)
01/08/16 CPX pt got B 12 inj; pt said was to come every 3 months for B 12 injection and taking B 12 sublingual also. Pt last got B 12 12/2016; pt had CPX on 02/02/17 and noted in that visit B 12 sublingual. Last B 12 level 02/01/17. Dr Glori Bickers said to give pt B 12 injection today(done) and send her a note to review for future B 12 orders. Pt will wait for phone call before scheduling any further B 12 injections.

## 2017-04-13 NOTE — Telephone Encounter (Signed)
Pt notified of Dr. Marliss Coots comments and instructions. Next b12 inj scheduled in 3 months, and med list updated

## 2017-04-13 NOTE — Telephone Encounter (Signed)
Continue a b12 shot every 3 mo   Please add that to her regular medicine orders Thanks

## 2017-04-15 DIAGNOSIS — R0902 Hypoxemia: Secondary | ICD-10-CM | POA: Diagnosis not present

## 2017-04-23 ENCOUNTER — Ambulatory Visit (INDEPENDENT_AMBULATORY_CARE_PROVIDER_SITE_OTHER): Payer: Medicare Other

## 2017-04-23 DIAGNOSIS — Z23 Encounter for immunization: Secondary | ICD-10-CM

## 2017-04-26 ENCOUNTER — Other Ambulatory Visit: Payer: Self-pay | Admitting: Internal Medicine

## 2017-04-26 MED ORDER — FUROSEMIDE 40 MG PO TABS: 40.0000 mg | ORAL_TABLET | Freq: Every day | ORAL | 1 refills | Status: DC

## 2017-05-15 DIAGNOSIS — R0902 Hypoxemia: Secondary | ICD-10-CM | POA: Diagnosis not present

## 2017-05-18 ENCOUNTER — Ambulatory Visit: Payer: Self-pay

## 2017-05-18 NOTE — Telephone Encounter (Signed)
   Reason for Disposition . [1] MILD difficulty breathing (e.g., minimal/no SOB at rest, SOB with walking, pulse <100) AND [2] NEW-onset or WORSE than normal . [1] Longstanding difficulty breathing (e.g., CHF, COPD, emphysema) AND [2] WORSE than normal  Answer Assessment - Initial Assessment Questions 1. RESPIRATORY STATUS: "Describe your breathing?" (e.g., wheezing, shortness of breath, unable to speak, severe coughing)     SOB 2. ONSET: "When did this breathing problem begin?"       4 years  3. PATTERN "Does the difficult breathing come and go, or has it been constant since it started?"      Worse with activity and after she eats 4. SEVERITY: "How bad is your breathing?" (e.g., mild, moderate, severe)    - MILD: No SOB at rest, mild SOB with walking, speaks normally in sentences, can lay down, no retractions, pulse < 100.    - MODERATE: SOB at rest, SOB with minimal exertion and prefers to sit, cannot lie down flat, speaks in phrases, mild retractions, audible wheezing, pulse 100-120.    - SEVERE: Very SOB at rest, speaks in single words, struggling to breathe, sitting hunched forward, retractions, pulse > 120      mild 5. RECURRENT SYMPTOM: "Have you had difficulty breathing before?" If so, ask: "When was the last time?" and "What happened that time?"      Yes x 4 years 6. CARDIAC HISTORY: "Do you have any history of heart disease?" (e.g., heart attack, angina, bypass surgery, angioplasty)      Heart attack 2010 with 2 stents placed 7. LUNG HISTORY: "Do you have any history of lung disease?"  (e.g., pulmonary embolus, asthma, emphysema)     BOOP 8. CAUSE: "What do you think is causing the breathing problem?"      unsure 9. OTHER SYMPTOMS: "Do you have any other symptoms? (e.g., dizziness, runny nose, cough, chest pain, fever)     All of these sx all the time 10. PREGNANCY: "Is there any chance you are pregnant?" "When was your last menstrual period?"       n/a 11. TRAVEL: "Have you  traveled out of the country in the last month?" (e.g., travel history, exposures)       no  Protocols used: BREATHING DIFFICULTY-A-AH  Pt with h/o BOOP reports increases SOB at rest and exertion who is seeking a second opinion or a change in treatment. She was scheduled for an appt tomorrow at 1000 with her PCP

## 2017-05-19 ENCOUNTER — Ambulatory Visit (INDEPENDENT_AMBULATORY_CARE_PROVIDER_SITE_OTHER): Payer: Medicare Other | Admitting: Family Medicine

## 2017-05-19 ENCOUNTER — Encounter: Payer: Self-pay | Admitting: Family Medicine

## 2017-05-19 VITALS — BP 130/68 | HR 90 | Temp 98.1°F | Ht 64.75 in | Wt 266.5 lb

## 2017-05-19 DIAGNOSIS — R918 Other nonspecific abnormal finding of lung field: Secondary | ICD-10-CM | POA: Diagnosis not present

## 2017-05-19 DIAGNOSIS — J9611 Chronic respiratory failure with hypoxia: Secondary | ICD-10-CM

## 2017-05-19 DIAGNOSIS — J45991 Cough variant asthma: Secondary | ICD-10-CM | POA: Diagnosis not present

## 2017-05-19 DIAGNOSIS — J9612 Chronic respiratory failure with hypercapnia: Secondary | ICD-10-CM | POA: Diagnosis not present

## 2017-05-19 DIAGNOSIS — K219 Gastro-esophageal reflux disease without esophagitis: Secondary | ICD-10-CM | POA: Diagnosis not present

## 2017-05-19 NOTE — Patient Instructions (Signed)
Let's refer you to pulmonary dept at [facility] and current medications (including gerd medications)

## 2017-05-19 NOTE — Progress Notes (Signed)
Subjective:    Patient ID: Kelli Velasquez, female    DOB: 10/08/1940, 76 y.o.   MRN: [id number]  HPI Here for breathing issues  Interested in another opinion  Feels like her breathing problems are getting worse (much worse)  Very sob on exertion  Better with rest (sitting still)   Sees pulmonary  Hx of cough variant asthma vs UACS and pulmonary nodules c/w Nodular BOOP  On 02- her pulse ox has been between 89-90  %  Cannot walk 150 feet  No quality of life   Works to keep her gerd in good control  Omeprazole am  nexium pm  Zantac also  Former smoker   25 pack years   Wt Readings from Last 3 Encounters:  05/19/17 266 lb 8 oz (120.9 kg)  03/17/17 243 lb 12.8 oz (110.6 kg)  02/03/17 250 lb (113.4 kg)   44.69 kg/m  H/o CAD and CHF class 2  Also morbid obesity  Last cxr  DG Chest 2 View (Accession [id number]) [id number] [id number])  Imaging  Date: 01/11/2017 Department: [facility] Released By: Yvette Rack, RT Authorizing: Tanda Rockers, MD  Exam Information   Status Exam Begun  Exam Ended   Final [99] 01/11/2017 11:01 AM 01/11/2017 11:08 AM  PACS Images   Show images for DG Chest 2 View  Study Result   CLINICAL DATA:  Two weeks of dyspnea, cough, and chills. History of previous MI, BOOP, former smoker.  EXAM: CHEST  2 VIEW  COMPARISON:  PA and lateral chest x-ray of November 20, 2016  FINDINGS: The lungs are adequately inflated. The lung markings are coarse at the left lung base but are stable. The cardiac silhouette is chronically enlarged. The pulmonary vascularity is not engorged. There is calcification in the wall of the aortic arch. The bony thorax exhibits no acute abnormality.  IMPRESSION: Chronic interstitial changes. No acute pneumonia nor CHF. Given the patient's pulmonary history, chest CT scanning may be a useful next imaging step.  Thoracic aortic atherosclerosis.   Electronically Signed   By: David   Martinique M.D.   On: 01/11/2017 11:32  CT Chest Wo Contrast (Accession [id number]) (Order [id number])  Imaging  Date: 06/15/2013 Department: [facility] [address] Released By: Sherlie Ban Authorizing: Melvenia Needles, NP  Exam Information   Status Exam Begun  Exam Ended   Final [99] 06/15/2013 9:31 AM 06/15/2013 9:39 AM  PACS Images   Show images for CT Chest Wo Contrast  Study Result   CLINICAL DATA:  Followup pulmonary nodules.  Shortness of breath.  EXAM: CT CHEST WITHOUT CONTRAST  TECHNIQUE: Multidetector CT imaging of the chest was performed following the standard protocol without IV contrast.  COMPARISON:  11/25/2012.  FINDINGS: Mediastinal lymph nodes measure up to 11 mm in the AP window, as before. Hilar regions are difficult to definitively evaluate without IV contrast. No axillary adenopathy. Lipomatous hypertrophy of the interatrial septum is incidentally noted. Atherosclerotic calcification of the arterial vasculature including extensive three-vessel involvement of the coronary arteries. Heart size normal. No pericardial effusion.  Nodular airspace disease is seen extensively throughout both lungs, with a slight basilar predominance. Findings appear to be in a hematogenous distribution. There is scattered ground-glass as well. Largest nodular densities seen in the lower lobes on the prior examination appear less prominent today. No pleural fluid. Airway is unremarkable.  Incidental imaging of the upper abdomen shows no acute findings. No worrisome lytic or sclerotic  lesions. Degenerative changes are seen in the spine.  IMPRESSION: 1. Interval development of widespread nodular airspace consolidation and ground-glass, seen in a hematogenous distribution. Findings are suspicious for septic emboli. Less likely diagnostic considerations include an infectious process or organizing pneumonia. These results will be called to the  ordering clinician or representative by the Radiologist Assistant, and communication documented in the PACS Dashboard. 2. Largest nodular densities seen in the lower lobes on the prior examination appear less prominent today. 3. Three-vessel coronary artery calcification.   Electronically Signed   By: Lorin Picket M.D.   On: 06/15/2013 10:16   Today puls ox is 91% on 02  PFT were 2014  Biopsy2014 showed org pneumonia with granulomatous properties   Pt states at this point BOOP has been ruled out   Pt has lost confidence in Dr Melvyn Novas  singulaire Prednisone 20 mg -- trying hard to wean it - makes her shaky Also hot and cold   Uses her proair very infrequently   Does not think heart plays a role Had a good check in July  Takes lasix   Sinuses do bother her  Ears are clogged -affects hearing   Patient Active Problem List   Diagnosis Date Noted  . Encounter for screening mammogram for breast cancer 02/02/2017  . Chronic combined systolic and diastolic CHF, NYHA class 2 ([place]) 02/02/2017  . Depressed mood 12/02/2016  . Routine general medical examination at a health care facility 12/30/2015  . Supraumbilical hernia 52/84/1324  . Lump in the abdomen 04/10/2015  . GERD (gastroesophageal reflux disease) 10/10/2014  . B12 deficiency 10/09/2014  . Skin lesions 10/09/2014  . Cataracts, bilateral 10/09/2014  . Dry eyes 10/09/2014  . Fatigue 05/08/2014  . Pedal edema 05/08/2014  . Shoulder tendonitis 01/24/2014  . Osteopenia 11/28/2013  . Estrogen deficiency 11/02/2013  . Dyspnea on exertion 06/27/2013  . Hernia, incisional 04/03/2013  . 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 Cough 12/18/2012  . Chronic respiratory failure with hypoxia and hypercapnia ([place]) 10/26/2012  . Special screening for malignant neoplasms, colon 06/16/2011  . DYSPHONIA 10/10/2010  . HYPERGLYCEMIA 05/27/2010  . CAD, NATIVE VESSEL 08/19/2009  . MYOCARDIAL INFARCTION, SUBENDOCARDIAL,  INITIAL EPISODE 08/07/2009  . Severe obesity (BMI >= 40) ([place]) 09/01/2007  . MYOPIA 03/03/2007  . Hyperlipidemia 10/15/2006  . Essential hypertension 10/15/2006  . Allergic rhinitis 10/15/2006  . Cough variant asthma vs UACS  10/15/2006  . OVERACTIVE BLADDER 10/15/2006  . OSTEOARTHRITIS 10/15/2006  . Urinary incontinence 10/15/2006   Past Medical History:  Diagnosis Date  . Allergy    allergic rhinitis  . Asthma   . Chronic bronchitis ([place])   . Coronary artery disease    cath January 2011 with DEs LAD and RCA  . Dyspnea   . Full dentures   . Gallstones   . GERD (gastroesophageal reflux disease)   . History of echocardiogram    Echo 5/17: mod LVH, EF 50-55%, ant-septal HK, Gr 1 DD, mod LAE //  b.  Echo 7/17: mild concentric LVH, EF 45-50%, inf-lat, inf, inf-septal HK, mild LAE  . History of nuclear stress test    Myoview 7/17: EF 48%, small mild apical defect, no ischemia, low risk  . Hyperlipidemia   . Hypertension   . Myocardial infarction ([place])    subendocardial, initial episode, 2010 two stents placed  . Myopia   . 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 Nocturnal oxygen desaturation  o2 at night  . Obesity   . Osteoarthritis    knees, fingers, shoulders  . Overactive bladder   . Oxygen deficiency    uses oxygen all day  . Rash    and other non specific skin eruptions  . Retaining fluid    in ankles and feet  . Urinary incontinence   . Vertigo   . Wears glasses    Past Surgical History:  Procedure Laterality Date  . CATARACT EXTRACTION W/PHACO Right 12/16/2016   Procedure: CATARACT EXTRACTION PHACO AND INTRAOCULAR LENS PLACEMENT ([place])  right;  Surgeon: Leandrew Koyanagi, MD;  Location: [place];  Service: Ophthalmology;  Laterality: Right;  . CATARACT EXTRACTION W/PHACO Left 02/03/2017   Procedure: CATARACT EXTRACTION PHACO AND INTRAOCULAR LENS PLACEMENT ([place])  Left  Complicated;  Surgeon: Leandrew Koyanagi, MD;   Location: [place];  Service: Ophthalmology;  Laterality: Left;  [place] Uses oxygen   . CHOLECYSTECTOMY  92  . COLONOSCOPY    . CORONARY ANGIOPLASTY WITH STENT PLACEMENT  07/2009   stent  . DILATION AND CURETTAGE OF UTERUS  1984  . INCISIONAL HERNIA REPAIR N/A 05/16/2013   Procedure: HERNIA REPAIR INFRAUMILICAL INCISIONAL;  Surgeon: Joyice Faster. Cornett, MD;  Location: [place];  Service: General;  Laterality: N/A;  umbilical  . INSERTION OF MESH N/A 05/16/2013   Procedure: INSERTION OF MESH;  Surgeon: Joyice Faster. Cornett, MD;  Location: [place];  Service: General;  Laterality: N/A;  umbilical  . JOINT REPLACEMENT  2007   right total knee replacement  . TOTAL KNEE ARTHROPLASTY  2010   left , then vocal cord infection post op  . VIDEO BRONCHOSCOPY Bilateral 07/05/2013   Procedure: VIDEO BRONCHOSCOPY WITH FLUORO;  Surgeon: Tanda Rockers, MD;  Location: [facility];  Service: Cardiopulmonary;  Laterality: Bilateral;  . vocal cord polypectomy     Social History  Substance Use Topics  . Smoking status: Former Smoker    Packs/day: 1.00    Years: 25.00    Types: Cigarettes    Quit date: 07/27/1982  . Smokeless tobacco: Never Used  . Alcohol use 0.0 oz/week     Comment: wine-rare   Family History  Problem Relation Age of Onset  . Stroke Mother   . Diabetes Mother 50  . Stroke Father   . Heart failure Father   . Breast cancer Maternal Grandmother   . Colon cancer Neg Hx    Allergies  Allergen Reactions  . Shellfish Allergy Anaphylaxis  . Aspirin     REACTION: nausea and vomiting High doses  . Atorvastatin     REACTION: leg pain  . Crestor [Rosuvastatin Calcium] Other (See Comments)    Muscle pain - allergy/intolerance  . Fish Allergy   . Penicillins     Due to mold allergy per pt  . Simvastatin     REACTION: muscle pain  . Trandolapril     REACTION: leg pain   Current Outpatient Prescriptions on File Prior to Visit    Medication Sig Dispense Refill  . acetaminophen (TYLENOL) 325 MG tablet Take as needed per bottle    . acetaminophen-codeine (TYLENOL #3) 300-30 MG tablet One every 4 hours as needed for cough 40 tablet 0  . amLODipine (NORVASC) 10 MG tablet Take 1 tablet (10 mg total) by mouth daily. 90 tablet 3  . aspirin 81 MG tablet Take 81 mg by mouth daily.    . chlorpheniramine (CHLOR-TRIMETON) 4 MG tablet 4 mg. Take 1  In the morning and 2 at bedtime    . cromolyn (NASALCROM) 5.2 MG/ACT nasal spray Place 2 sprays into both nostrils daily as needed for allergies.    . cyanocobalamin (,VITAMIN B-12,) 1000 MCG/ML injection Inject 1,000 mcg into the muscle every 3 (three) months.    . Cyanocobalamin (VITAMIN B12) 3000 MCG SUBL Place 1 capsule under the tongue daily.    Marland Kitchen dextromethorphan-guaiFENesin (MUCINEX DM) 30-600 MG 12hr tablet Take 1 tablet by mouth 2 (two) times daily as needed for cough.    . EPIPEN 2-PAK 0.3 MG/0.3ML SOAJ injection INJECT 0.3 MLS (0.3 MG TOTAL) INTO THE MUSCLE ONCE AS NEEDED FOR ALLERGIC REACTION 2 Device 0  . esomeprazole (NEXIUM) 40 MG capsule Take 1 capsule (40 mg total) by mouth daily. 90 capsule 3  . ezetimibe (ZETIA) 10 MG tablet TAKE 1 TABLET (10 MG TOTAL) BY MOUTH DAILY. 90 tablet 3  . furosemide (LASIX) 20 MG tablet TAKE 1 TABLET BY MOUTH EVERY DAY 30 tablet 2  . furosemide (LASIX) 40 MG tablet Take 1 tablet (40 mg total) by mouth daily. Take 1 daily as needed for extra swelling 90 tablet 1  . montelukast (SINGULAIR) 10 MG tablet Take 1 tablet (10 mg total) by mouth at bedtime. 90 tablet 3  . Multiple Vitamin (MULTIVITAMIN) capsule Take 1 capsule by mouth daily.    . OXYGEN Inhale into the lungs. 24/7 2 lpm  [employer]    . potassium chloride SA (KLOR-CON M20) 20 MEQ tablet Take 1 tablet (20 mEq total) by mouth daily as needed. 90 tablet 3  . PREDNISONE PO Take 20 mg by mouth.     Marland Kitchen PROAIR HFA 108 (90 BASE) MCG/ACT inhaler INHALE 2 PUFFS BY MOUTH EVERY 4 HOURS AS NEEDED FOR  WHEEZING OR FOR SHORTNESS OF BREATH 8 Inhaler 5  . ranitidine (ZANTAC) 75 MG tablet Take 300 mg by mouth at bedtime.      No current facility-administered medications on file prior to visit.      Review of Systems  Constitutional: Negative for activity change, appetite change, fatigue, fever and unexpected weight change.  HENT: Negative for congestion, ear pain, rhinorrhea, sinus pressure and sore throat.   Eyes: Negative for pain, redness and visual disturbance.  Respiratory: Positive for cough and shortness of breath. Negative for wheezing.   Cardiovascular: Negative for chest pain and palpitations.  Gastrointestinal: Negative for abdominal pain, blood in stool, constipation and diarrhea.  Endocrine: Negative for polydipsia and polyuria.  Genitourinary: Negative for dysuria, frequency and urgency.  Musculoskeletal: Negative for arthralgias, back pain and myalgias.  Skin: Negative for pallor and rash.  Allergic/Immunologic: Negative for environmental allergies.  Neurological: Negative for dizziness, syncope and headaches.  Hematological: Negative for adenopathy. Does not bruise/bleed easily.  Psychiatric/Behavioral: Negative for decreased concentration and dysphoric mood. The patient is not nervous/anxious.        Objective:   Physical Exam  Constitutional: She appears well-developed and well-nourished. No distress.  obese and well appearing  Wearing 02  Not sob at rest  HENT:  Head: Normocephalic and atraumatic.  Mouth/Throat: Oropharynx is clear and moist.  Eyes: Pupils are equal, round, and reactive to light. Conjunctivae and EOM are normal.  Neck: Normal range of motion. Neck supple. No JVD present. Carotid bruit is not present. No thyromegaly present.  Cardiovascular: Normal rate, regular rhythm, normal heart sounds and intact distal pulses.  Exam reveals no gallop.   Pulmonary/Chest: Effort normal and breath sounds normal. No respiratory distress. She  has no wheezes. She  has no rales.  Diffusely distant bs   No wheeze  Faint dry crackles at bases w/o rales or rhonchi   Abdominal: Soft. She exhibits no abdominal bruit. There is no tenderness.  Musculoskeletal: She exhibits no edema.  Lymphadenopathy:    She has no cervical adenopathy.  Neurological: She is alert. She has normal reflexes.  Skin: Skin is warm and dry. No rash noted. No pallor.  Psychiatric: She has a normal mood and affect.          Assessment & Plan:   Problem List Items Addressed This Visit      Respiratory   Chronic respiratory failure with hypoxia and hypercapnia (HCC)    Pt's symptoms continue to worsen (sob on exertion) She has difficulty with regular tasks at this time  Rev studies /PFT/imaging so far  Would like to go to a tertiary care center for further evaluation  Continues 02 and medication for reflux  Continues prednisone  Will ref to [facility]ology at [facility]      Relevant Orders   Ambulatory referral to Pulmonology   Cough variant asthma vs UACS    Relevant Orders   Ambulatory referral to Pulmonology     Digestive   GERD (gastroesophageal reflux disease)    Continues current nexium and zantac  Unsure if this continue to affect her pulm status  In the process of ref to [facility] pulmonology        Other   Pulmonary nodules c/w Nodular BOOP    Symptomatic  Ref to [facility] pulmonology      Relevant Orders   Ambulatory referral to Pulmonology   Severe obesity (BMI >= 40) ([place]) - Primary    Pt is unable to make progress with wt loss due to chronic prednisone and inability to exercise due to pulm status

## 2017-05-20 NOTE — Assessment & Plan Note (Signed)
Continues current nexium and zantac  Unsure if this continue to affect her pulm status  In the process of ref to [facility] pulmonology

## 2017-05-20 NOTE — Assessment & Plan Note (Signed)
Pt's symptoms continue to worsen (sob on exertion) She has difficulty with regular tasks at this time  Rev studies /PFT/imaging so far  Would like to go to a tertiary care center for further evaluation  Continues 02 and medication for reflux  Continues prednisone  Will ref to pulmonology at [facility]

## 2017-05-20 NOTE — Assessment & Plan Note (Signed)
Pt is unable to make progress with wt loss due to chronic prednisone and inability to exercise due to pulm status

## 2017-05-20 NOTE — Assessment & Plan Note (Signed)
Symptomatic  Ref to [facility] pulmonology

## 2017-05-26 ENCOUNTER — Inpatient Hospital Stay (HOSPITAL_COMMUNITY)
Admission: EM | Admit: 2017-05-26 | Discharge: 2017-06-26 | DRG: 981 | Disposition: A | Discharge status: Skilled Nursing Facility | Payer: Medicare Other | Attending: Internal Medicine | Admitting: Internal Medicine

## 2017-05-26 ENCOUNTER — Inpatient Hospital Stay (HOSPITAL_COMMUNITY): Payer: Medicare Other

## 2017-05-26 ENCOUNTER — Emergency Department (HOSPITAL_COMMUNITY): Payer: Medicare Other

## 2017-05-26 ENCOUNTER — Encounter (HOSPITAL_COMMUNITY): Payer: Self-pay | Admitting: Emergency Medicine

## 2017-05-26 DIAGNOSIS — I471 Supraventricular tachycardia: Secondary | ICD-10-CM | POA: Diagnosis present

## 2017-05-26 DIAGNOSIS — G9340 Encephalopathy, unspecified: Secondary | ICD-10-CM | POA: Diagnosis not present

## 2017-05-26 DIAGNOSIS — J9602 Acute respiratory failure with hypercapnia: Secondary | ICD-10-CM | POA: Diagnosis not present

## 2017-05-26 DIAGNOSIS — J181 Lobar pneumonia, unspecified organism: Secondary | ICD-10-CM | POA: Diagnosis not present

## 2017-05-26 DIAGNOSIS — H6123 Impacted cerumen, bilateral: Secondary | ICD-10-CM | POA: Diagnosis not present

## 2017-05-26 DIAGNOSIS — J9601 Acute respiratory failure with hypoxia: Secondary | ICD-10-CM

## 2017-05-26 DIAGNOSIS — H9113 Presbycusis, bilateral: Secondary | ICD-10-CM | POA: Diagnosis not present

## 2017-05-26 DIAGNOSIS — E870 Hyperosmolality and hypernatremia: Secondary | ICD-10-CM | POA: Diagnosis not present

## 2017-05-26 DIAGNOSIS — J84116 Cryptogenic organizing pneumonia: Secondary | ICD-10-CM | POA: Diagnosis not present

## 2017-05-26 DIAGNOSIS — J440 Chronic obstructive pulmonary disease with acute lower respiratory infection: Secondary | ICD-10-CM | POA: Diagnosis not present

## 2017-05-26 DIAGNOSIS — B370 Candidal stomatitis: Secondary | ICD-10-CM | POA: Diagnosis not present

## 2017-05-26 DIAGNOSIS — Z823 Family history of stroke: Secondary | ICD-10-CM

## 2017-05-26 DIAGNOSIS — Z789 Other specified health status: Secondary | ICD-10-CM | POA: Diagnosis not present

## 2017-05-26 DIAGNOSIS — E1169 Type 2 diabetes mellitus with other specified complication: Secondary | ICD-10-CM

## 2017-05-26 DIAGNOSIS — J9620 Acute and chronic respiratory failure, unspecified whether with hypoxia or hypercapnia: Secondary | ICD-10-CM | POA: Diagnosis not present

## 2017-05-26 DIAGNOSIS — E785 Hyperlipidemia, unspecified: Secondary | ICD-10-CM | POA: Diagnosis present

## 2017-05-26 DIAGNOSIS — J8489 Other specified interstitial pulmonary diseases: Secondary | ICD-10-CM | POA: Diagnosis not present

## 2017-05-26 DIAGNOSIS — J810 Acute pulmonary edema: Secondary | ICD-10-CM

## 2017-05-26 DIAGNOSIS — J9690 Respiratory failure, unspecified, unspecified whether with hypoxia or hypercapnia: Secondary | ICD-10-CM | POA: Diagnosis not present

## 2017-05-26 DIAGNOSIS — T82858A Stenosis of vascular prosthetic devices, implants and grafts, initial encounter: Secondary | ICD-10-CM | POA: Diagnosis not present

## 2017-05-26 DIAGNOSIS — E43 Unspecified severe protein-calorie malnutrition: Secondary | ICD-10-CM | POA: Diagnosis not present

## 2017-05-26 DIAGNOSIS — K219 Gastro-esophageal reflux disease without esophagitis: Secondary | ICD-10-CM | POA: Diagnosis present

## 2017-05-26 DIAGNOSIS — R2681 Unsteadiness on feet: Secondary | ICD-10-CM | POA: Diagnosis not present

## 2017-05-26 DIAGNOSIS — M6281 Muscle weakness (generalized): Secondary | ICD-10-CM | POA: Diagnosis not present

## 2017-05-26 DIAGNOSIS — R4182 Altered mental status, unspecified: Secondary | ICD-10-CM | POA: Diagnosis not present

## 2017-05-26 DIAGNOSIS — E874 Mixed disorder of acid-base balance: Secondary | ICD-10-CM | POA: Diagnosis not present

## 2017-05-26 DIAGNOSIS — I249 Acute ischemic heart disease, unspecified: Secondary | ICD-10-CM | POA: Diagnosis not present

## 2017-05-26 DIAGNOSIS — Z781 Physical restraint status: Secondary | ICD-10-CM

## 2017-05-26 DIAGNOSIS — Z7952 Long term (current) use of systemic steroids: Secondary | ICD-10-CM

## 2017-05-26 DIAGNOSIS — I200 Unstable angina: Secondary | ICD-10-CM | POA: Diagnosis not present

## 2017-05-26 DIAGNOSIS — Y838 Other surgical procedures as the cause of abnormal reaction of the patient, or of later complication, without mention of misadventure at the time of the procedure: Secondary | ICD-10-CM | POA: Diagnosis present

## 2017-05-26 DIAGNOSIS — R278 Other lack of coordination: Secondary | ICD-10-CM | POA: Diagnosis not present

## 2017-05-26 DIAGNOSIS — J189 Pneumonia, unspecified organism: Secondary | ICD-10-CM | POA: Diagnosis not present

## 2017-05-26 DIAGNOSIS — I5043 Acute on chronic combined systolic (congestive) and diastolic (congestive) heart failure: Secondary | ICD-10-CM | POA: Diagnosis present

## 2017-05-26 DIAGNOSIS — Z9289 Personal history of other medical treatment: Secondary | ICD-10-CM

## 2017-05-26 DIAGNOSIS — I340 Nonrheumatic mitral (valve) insufficiency: Secondary | ICD-10-CM | POA: Diagnosis not present

## 2017-05-26 DIAGNOSIS — Z8249 Family history of ischemic heart disease and other diseases of the circulatory system: Secondary | ICD-10-CM

## 2017-05-26 DIAGNOSIS — I25110 Atherosclerotic heart disease of native coronary artery with unstable angina pectoris: Secondary | ICD-10-CM | POA: Diagnosis present

## 2017-05-26 DIAGNOSIS — R0902 Hypoxemia: Secondary | ICD-10-CM

## 2017-05-26 DIAGNOSIS — D696 Thrombocytopenia, unspecified: Secondary | ICD-10-CM | POA: Diagnosis not present

## 2017-05-26 DIAGNOSIS — I5023 Acute on chronic systolic (congestive) heart failure: Secondary | ICD-10-CM | POA: Diagnosis not present

## 2017-05-26 DIAGNOSIS — J9811 Atelectasis: Secondary | ICD-10-CM | POA: Diagnosis not present

## 2017-05-26 DIAGNOSIS — R918 Other nonspecific abnormal finding of lung field: Secondary | ICD-10-CM | POA: Diagnosis not present

## 2017-05-26 DIAGNOSIS — R0602 Shortness of breath: Secondary | ICD-10-CM | POA: Diagnosis not present

## 2017-05-26 DIAGNOSIS — Z79899 Other long term (current) drug therapy: Secondary | ICD-10-CM

## 2017-05-26 DIAGNOSIS — I10 Essential (primary) hypertension: Secondary | ICD-10-CM | POA: Diagnosis not present

## 2017-05-26 DIAGNOSIS — J90 Pleural effusion, not elsewhere classified: Secondary | ICD-10-CM | POA: Diagnosis not present

## 2017-05-26 DIAGNOSIS — Y92239 Unspecified place in hospital as the place of occurrence of the external cause: Secondary | ICD-10-CM | POA: Diagnosis present

## 2017-05-26 DIAGNOSIS — Z87891 Personal history of nicotine dependence: Secondary | ICD-10-CM

## 2017-05-26 DIAGNOSIS — K5900 Constipation, unspecified: Secondary | ICD-10-CM | POA: Diagnosis present

## 2017-05-26 DIAGNOSIS — I959 Hypotension, unspecified: Secondary | ICD-10-CM | POA: Diagnosis present

## 2017-05-26 DIAGNOSIS — Y848 Other medical procedures as the cause of abnormal reaction of the patient, or of later complication, without mention of misadventure at the time of the procedure: Secondary | ICD-10-CM | POA: Diagnosis not present

## 2017-05-26 DIAGNOSIS — G4733 Obstructive sleep apnea (adult) (pediatric): Secondary | ICD-10-CM | POA: Diagnosis present

## 2017-05-26 DIAGNOSIS — Z515 Encounter for palliative care: Secondary | ICD-10-CM

## 2017-05-26 DIAGNOSIS — N179 Acute kidney failure, unspecified: Secondary | ICD-10-CM | POA: Diagnosis not present

## 2017-05-26 DIAGNOSIS — J441 Chronic obstructive pulmonary disease with (acute) exacerbation: Secondary | ICD-10-CM | POA: Diagnosis not present

## 2017-05-26 DIAGNOSIS — D539 Nutritional anemia, unspecified: Secondary | ICD-10-CM | POA: Diagnosis present

## 2017-05-26 DIAGNOSIS — I472 Ventricular tachycardia: Secondary | ICD-10-CM | POA: Diagnosis not present

## 2017-05-26 DIAGNOSIS — Z96651 Presence of right artificial knee joint: Secondary | ICD-10-CM | POA: Diagnosis present

## 2017-05-26 DIAGNOSIS — I2510 Atherosclerotic heart disease of native coronary artery without angina pectoris: Secondary | ICD-10-CM | POA: Diagnosis not present

## 2017-05-26 DIAGNOSIS — J384 Edema of larynx: Secondary | ICD-10-CM | POA: Diagnosis not present

## 2017-05-26 DIAGNOSIS — J9622 Acute and chronic respiratory failure with hypercapnia: Secondary | ICD-10-CM | POA: Diagnosis present

## 2017-05-26 DIAGNOSIS — N318 Other neuromuscular dysfunction of bladder: Secondary | ICD-10-CM | POA: Diagnosis present

## 2017-05-26 DIAGNOSIS — G9341 Metabolic encephalopathy: Secondary | ICD-10-CM | POA: Diagnosis not present

## 2017-05-26 DIAGNOSIS — H902 Conductive hearing loss, unspecified: Secondary | ICD-10-CM | POA: Diagnosis not present

## 2017-05-26 DIAGNOSIS — R001 Bradycardia, unspecified: Secondary | ICD-10-CM | POA: Diagnosis not present

## 2017-05-26 DIAGNOSIS — Z7189 Other specified counseling: Secondary | ICD-10-CM | POA: Diagnosis not present

## 2017-05-26 DIAGNOSIS — I509 Heart failure, unspecified: Secondary | ICD-10-CM | POA: Diagnosis not present

## 2017-05-26 DIAGNOSIS — I252 Old myocardial infarction: Secondary | ICD-10-CM

## 2017-05-26 DIAGNOSIS — J690 Pneumonitis due to inhalation of food and vomit: Secondary | ICD-10-CM

## 2017-05-26 DIAGNOSIS — Z452 Encounter for adjustment and management of vascular access device: Secondary | ICD-10-CM | POA: Diagnosis not present

## 2017-05-26 DIAGNOSIS — T380X5A Adverse effect of glucocorticoids and synthetic analogues, initial encounter: Secondary | ICD-10-CM | POA: Diagnosis present

## 2017-05-26 DIAGNOSIS — T82868A Thrombosis of vascular prosthetic devices, implants and grafts, initial encounter: Secondary | ICD-10-CM | POA: Diagnosis not present

## 2017-05-26 DIAGNOSIS — I110 Hypertensive heart disease with heart failure: Secondary | ICD-10-CM | POA: Diagnosis not present

## 2017-05-26 DIAGNOSIS — R0600 Dyspnea, unspecified: Secondary | ICD-10-CM | POA: Diagnosis not present

## 2017-05-26 DIAGNOSIS — Z4682 Encounter for fitting and adjustment of non-vascular catheter: Secondary | ICD-10-CM | POA: Diagnosis not present

## 2017-05-26 DIAGNOSIS — Z4659 Encounter for fitting and adjustment of other gastrointestinal appliance and device: Secondary | ICD-10-CM

## 2017-05-26 DIAGNOSIS — J15211 Pneumonia due to Methicillin susceptible Staphylococcus aureus: Secondary | ICD-10-CM | POA: Diagnosis not present

## 2017-05-26 DIAGNOSIS — G92 Toxic encephalopathy: Secondary | ICD-10-CM | POA: Diagnosis not present

## 2017-05-26 DIAGNOSIS — Z9981 Dependence on supplemental oxygen: Secondary | ICD-10-CM

## 2017-05-26 DIAGNOSIS — I639 Cerebral infarction, unspecified: Secondary | ICD-10-CM | POA: Diagnosis not present

## 2017-05-26 DIAGNOSIS — E7800 Pure hypercholesterolemia, unspecified: Secondary | ICD-10-CM | POA: Diagnosis not present

## 2017-05-26 DIAGNOSIS — Z6841 Body Mass Index (BMI) 40.0 and over, adult: Secondary | ICD-10-CM

## 2017-05-26 DIAGNOSIS — J80 Acute respiratory distress syndrome: Secondary | ICD-10-CM | POA: Diagnosis not present

## 2017-05-26 DIAGNOSIS — J9621 Acute and chronic respiratory failure with hypoxia: Secondary | ICD-10-CM | POA: Diagnosis not present

## 2017-05-26 DIAGNOSIS — Z833 Family history of diabetes mellitus: Secondary | ICD-10-CM

## 2017-05-26 DIAGNOSIS — Z955 Presence of coronary angioplasty implant and graft: Secondary | ICD-10-CM

## 2017-05-26 DIAGNOSIS — Y95 Nosocomial condition: Secondary | ICD-10-CM | POA: Diagnosis not present

## 2017-05-26 DIAGNOSIS — Z66 Do not resuscitate: Secondary | ICD-10-CM | POA: Diagnosis present

## 2017-05-26 DIAGNOSIS — N3281 Overactive bladder: Secondary | ICD-10-CM | POA: Diagnosis present

## 2017-05-26 DIAGNOSIS — J9600 Acute respiratory failure, unspecified whether with hypoxia or hypercapnia: Secondary | ICD-10-CM | POA: Diagnosis not present

## 2017-05-26 DIAGNOSIS — Z01818 Encounter for other preprocedural examination: Secondary | ICD-10-CM

## 2017-05-26 DIAGNOSIS — R069 Unspecified abnormalities of breathing: Secondary | ICD-10-CM | POA: Diagnosis not present

## 2017-05-26 DIAGNOSIS — J811 Chronic pulmonary edema: Secondary | ICD-10-CM

## 2017-05-26 DIAGNOSIS — E876 Hypokalemia: Secondary | ICD-10-CM | POA: Diagnosis not present

## 2017-05-26 DIAGNOSIS — R1312 Dysphagia, oropharyngeal phase: Secondary | ICD-10-CM | POA: Diagnosis not present

## 2017-05-26 DIAGNOSIS — R609 Edema, unspecified: Secondary | ICD-10-CM | POA: Diagnosis not present

## 2017-05-26 DIAGNOSIS — R739 Hyperglycemia, unspecified: Secondary | ICD-10-CM | POA: Diagnosis present

## 2017-05-26 DIAGNOSIS — I4891 Unspecified atrial fibrillation: Secondary | ICD-10-CM | POA: Diagnosis present

## 2017-05-26 LAB — BLOOD GAS, ARTERIAL
Acid-Base Excess: 10.7 mmol/L — ABNORMAL HIGH (ref 0.0–2.0)
Acid-Base Excess: 12.1 mmol/L — ABNORMAL HIGH (ref 0.0–2.0)
BICARBONATE: 38.7 mmol/L — AB (ref 20.0–28.0)
BICARBONATE: 40.1 mmol/L — AB (ref 20.0–28.0)
DRAWN BY: 44166
DRAWN BY: 441371
Delivery systems: POSITIVE
Delivery systems: POSITIVE
EXPIRATORY PAP: 6
Expiratory PAP: 6
FIO2: 60
FIO2: 60
INSPIRATORY PAP: 16
Inspiratory PAP: 16
O2 SAT: 94.4 %
O2 SAT: 92.9 %
PCO2 ART: 102 mmHg — AB (ref 32.0–48.0)
PH ART: 7.211 — AB (ref 7.350–7.450)
PH ART: 7.215 — AB (ref 7.350–7.450)
PO2 ART: 85.5 mmHg (ref 83.0–108.0)
Patient temperature: 97.6
Patient temperature: 97.6
RATE: 12 resp/min
pCO2 arterial: 99.4 mmHg (ref 32.0–48.0)
pO2, Arterial: 77.9 mmHg — ABNORMAL LOW (ref 83.0–108.0)

## 2017-05-26 LAB — BASIC METABOLIC PANEL
Anion gap: 12 (ref 5–15)
BUN: 17 mg/dL (ref 6–20)
CALCIUM: 9.2 mg/dL (ref 8.9–10.3)
CHLORIDE: 98 mmol/L — AB (ref 101–111)
CO2: 34 mmol/L — AB (ref 22–32)
CREATININE: 0.9 mg/dL (ref 0.44–1.00)
GFR calc non Af Amer: >60 mL/min (ref >60)
Glucose, Bld: 172 mg/dL — ABNORMAL HIGH (ref 65–99)
Potassium: 4.2 mmol/L (ref 3.5–5.1)
SODIUM: 144 mmol/L (ref 135–145)

## 2017-05-26 LAB — TROPONIN I
TROPONIN I: 0.04 ng/mL — AB (ref <0.03)
TROPONIN I: 0.05 ng/mL — AB (ref <0.03)
TROPONIN I: 0.04 ng/mL — AB (ref <0.03)

## 2017-05-26 LAB — CBC
HEMATOCRIT: 41.4 % (ref 36.0–46.0)
Hemoglobin: 11.7 g/dL — ABNORMAL LOW (ref 12.0–15.0)
MCH: 29.3 pg (ref 26.0–34.0)
MCHC: 28.3 g/dL — AB (ref 30.0–36.0)
MCV: 103.8 fL — AB (ref 78.0–100.0)
Platelets: 149 10*3/uL — ABNORMAL LOW (ref 150–400)
RBC: 3.99 MIL/uL (ref 3.87–5.11)
RDW: 16.5 % — AB (ref 11.5–15.5)
WBC: 15.9 10*3/uL — ABNORMAL HIGH (ref 4.0–10.5)

## 2017-05-26 LAB — CBG MONITORING, ED: Glucose-Capillary: 196 mg/dL — ABNORMAL HIGH (ref 65–99)

## 2017-05-26 LAB — I-STAT ARTERIAL BLOOD GAS, ED
Acid-Base Excess: 13 mmol/L — ABNORMAL HIGH (ref 0.0–2.0)
BICARBONATE: 41.7 mmol/L — AB (ref 20.0–28.0)
O2 Saturation: 94 %
PO2 ART: 81 mmHg — AB (ref 83.0–108.0)
Patient temperature: 97.6
TCO2: 44 mmol/L — AB (ref 22–32)
pCO2 arterial: 79.5 mmHg (ref 32.0–48.0)
pH, Arterial: 7.326 — ABNORMAL LOW (ref 7.350–7.450)

## 2017-05-26 LAB — CBC WITH DIFFERENTIAL/PLATELET
BASOS PCT: 0 %
Basophils Absolute: 0 10*3/uL (ref 0.0–0.1)
EOS ABS: 0.4 10*3/uL (ref 0.0–0.7)
Eosinophils Relative: 2 %
HCT: 47.8 % — ABNORMAL HIGH (ref 36.0–46.0)
HEMOGLOBIN: 13.8 g/dL (ref 12.0–15.0)
LYMPHS ABS: 3 10*3/uL (ref 0.7–4.0)
Lymphocytes Relative: 14 %
MCH: 29.9 pg (ref 26.0–34.0)
MCHC: 28.9 g/dL — AB (ref 30.0–36.0)
MCV: 103.7 fL — ABNORMAL HIGH (ref 78.0–100.0)
MONO ABS: 1.4 10*3/uL — AB (ref 0.1–1.0)
MONOS PCT: 6 %
Neutro Abs: 16.7 10*3/uL — ABNORMAL HIGH (ref 1.7–7.7)
Neutrophils Relative %: 78 %
Platelets: 258 10*3/uL (ref 150–400)
RBC: 4.61 MIL/uL (ref 3.87–5.11)
RDW: 16.9 % — AB (ref 11.5–15.5)
WBC: 21.4 10*3/uL — ABNORMAL HIGH (ref 4.0–10.5)

## 2017-05-26 LAB — CREATININE, SERUM
Creatinine, Ser: 1.01 mg/dL — ABNORMAL HIGH (ref 0.44–1.00)
GFR calc Af Amer: >60 mL/min (ref >60)
GFR calc non Af Amer: 52 mL/min — ABNORMAL LOW (ref >60)

## 2017-05-26 LAB — GLUCOSE, CAPILLARY
GLUCOSE-CAPILLARY: 232 mg/dL — AB (ref 65–99)
GLUCOSE-CAPILLARY: 207 mg/dL — AB (ref 65–99)
Glucose-Capillary: 139 mg/dL — ABNORMAL HIGH (ref 65–99)

## 2017-05-26 LAB — ECHOCARDIOGRAM COMPLETE
HEIGHTINCHES: 64.75 in
Weight: 3952 oz

## 2017-05-26 LAB — TRIGLYCERIDES: Triglycerides: 90 mg/dL (ref <150)

## 2017-05-26 LAB — PROCALCITONIN: Procalcitonin: <0.1 ng/mL

## 2017-05-26 LAB — BRAIN NATRIURETIC PEPTIDE: B NATRIURETIC PEPTIDE 5: 760.9 pg/mL — AB (ref 0.0–100.0)

## 2017-05-26 LAB — MRSA PCR SCREENING: MRSA BY PCR: NEGATIVE

## 2017-05-26 MED ORDER — ORAL CARE MOUTH RINSE
15.0000 mL | Freq: Four times a day (QID) | OROMUCOSAL | Status: DC
  Administered 2017-05-26 – 2017-06-16 (×74): 15 mL via OROMUCOSAL

## 2017-05-26 MED ORDER — EZETIMIBE 10 MG PO TABS
10.0000 mg | ORAL_TABLET | Freq: Every day | ORAL | Status: DC
  Administered 2017-05-26 – 2017-05-30 (×5): 10 mg
  Filled 2017-05-26 (×5): qty 1

## 2017-05-26 MED ORDER — ALBUTEROL SULFATE (2.5 MG/3ML) 0.083% IN NEBU
2.5000 mg | INHALATION_SOLUTION | RESPIRATORY_TRACT | Status: DC | PRN
  Administered 2017-06-01: 2.5 mg via RESPIRATORY_TRACT
  Filled 2017-05-26: qty 3

## 2017-05-26 MED ORDER — AMLODIPINE BESYLATE 5 MG PO TABS: 10.0000 mg | ORAL_TABLET | Freq: Every day | ORAL | Status: DC

## 2017-05-26 MED ORDER — FUROSEMIDE 10 MG/ML IJ SOLN
40.0000 mg | Freq: Once | INTRAMUSCULAR | Status: AC
  Administered 2017-05-26: 40 mg via INTRAVENOUS
  Filled 2017-05-26: qty 4

## 2017-05-26 MED ORDER — IPRATROPIUM-ALBUTEROL 0.5-2.5 (3) MG/3ML IN SOLN
3.0000 mL | Freq: Once | RESPIRATORY_TRACT | Status: AC
  Administered 2017-05-26: 3 mL via RESPIRATORY_TRACT
  Filled 2017-05-26: qty 3

## 2017-05-26 MED ORDER — AMLODIPINE BESYLATE 10 MG PO TABS
10.0000 mg | ORAL_TABLET | Freq: Every day | ORAL | Status: DC
  Administered 2017-05-26 – 2017-05-30 (×5): 10 mg
  Filled 2017-05-26 (×7): qty 1

## 2017-05-26 MED ORDER — MONTELUKAST SODIUM 10 MG PO TABS
10.0000 mg | ORAL_TABLET | Freq: Every day | ORAL | Status: DC
Start: 2017-05-26 — End: 2017-05-31
  Administered 2017-05-26 – 2017-05-30 (×5): 10 mg
  Filled 2017-05-26 (×5): qty 1

## 2017-05-26 MED ORDER — SODIUM CHLORIDE 0.9 % IV SOLN
250.0000 mL | INTRAVENOUS | Status: DC | PRN
  Administered 2017-05-29: 250 mL via INTRAVENOUS

## 2017-05-26 MED ORDER — ASPIRIN 81 MG PO TABS: 81.0000 mg | ORAL_TABLET | Freq: Every day | ORAL | Status: DC

## 2017-05-26 MED ORDER — PROPOFOL 1000 MG/100ML IV EMUL
5.0000 ug/kg/min | Freq: Once | INTRAVENOUS | Status: AC
  Administered 2017-05-26: 5 ug/kg/min via INTRAVENOUS

## 2017-05-26 MED ORDER — CHLORHEXIDINE GLUCONATE 0.12% ORAL RINSE (MEDLINE KIT)
15.0000 mL | Freq: Two times a day (BID) | OROMUCOSAL | Status: DC
  Administered 2017-05-26 – 2017-06-16 (×39): 15 mL via OROMUCOSAL

## 2017-05-26 MED ORDER — PROPOFOL 1000 MG/100ML IV EMUL
INTRAVENOUS | Status: AC
  Filled 2017-05-26: qty 100

## 2017-05-26 MED ORDER — FENTANYL CITRATE (PF) 100 MCG/2ML IJ SOLN: 50.0000 ug | INTRAMUSCULAR | Status: DC | PRN

## 2017-05-26 MED ORDER — METHYLPREDNISOLONE SODIUM SUCC 40 MG IJ SOLR
20.0000 mg | Freq: Two times a day (BID) | INTRAMUSCULAR | Status: DC
  Administered 2017-05-26 – 2017-05-30 (×10): 20 mg via INTRAVENOUS
  Filled 2017-05-26: qty 1
  Filled 2017-05-26 (×10): qty 0.5

## 2017-05-26 MED ORDER — EZETIMIBE 10 MG PO TABS: 10.0000 mg | ORAL_TABLET | Freq: Every day | ORAL | Status: DC

## 2017-05-26 MED ORDER — INSULIN ASPART 100 UNIT/ML ~~LOC~~ SOLN
0.0000 [IU] | SUBCUTANEOUS | Status: DC
  Administered 2017-05-26: 3 [IU] via SUBCUTANEOUS
  Administered 2017-05-26: 4 [IU] via SUBCUTANEOUS
  Administered 2017-05-26: 7 [IU] via SUBCUTANEOUS
  Administered 2017-05-27 (×2): 3 [IU] via SUBCUTANEOUS
  Administered 2017-05-27 (×2): 4 [IU] via SUBCUTANEOUS
  Administered 2017-05-27 – 2017-05-28 (×2): 3 [IU] via SUBCUTANEOUS
  Administered 2017-05-28: 7 [IU] via SUBCUTANEOUS
  Administered 2017-05-28: 4 [IU] via SUBCUTANEOUS
  Administered 2017-05-28: 3 [IU] via SUBCUTANEOUS
  Administered 2017-05-28 – 2017-05-29 (×3): 4 [IU] via SUBCUTANEOUS
  Administered 2017-05-29: 7 [IU] via SUBCUTANEOUS
  Administered 2017-05-29: 11 [IU] via SUBCUTANEOUS
  Administered 2017-05-29 (×3): 7 [IU] via SUBCUTANEOUS
  Administered 2017-05-30: 4 [IU] via SUBCUTANEOUS
  Administered 2017-05-30: 11 [IU] via SUBCUTANEOUS
  Administered 2017-05-30 (×4): 4 [IU] via SUBCUTANEOUS
  Administered 2017-05-31: 3 [IU] via SUBCUTANEOUS
  Administered 2017-05-31 (×2): 4 [IU] via SUBCUTANEOUS
  Filled 2017-05-26: qty 1

## 2017-05-26 MED ORDER — ASPIRIN 81 MG PO CHEW
81.0000 mg | CHEWABLE_TABLET | Freq: Every day | ORAL | Status: DC
  Administered 2017-05-27 – 2017-05-30 (×4): 81 mg
  Filled 2017-05-26 (×6): qty 1

## 2017-05-26 MED ORDER — PROPOFOL 1000 MG/100ML IV EMUL
0.0000 ug/kg/min | INTRAVENOUS | Status: DC
  Administered 2017-05-26: 25 ug/kg/min via INTRAVENOUS
  Administered 2017-05-26: 40 ug/kg/min via INTRAVENOUS
  Administered 2017-05-27 (×2): 50 ug/kg/min via INTRAVENOUS
  Administered 2017-05-27 (×3): 40 ug/kg/min via INTRAVENOUS
  Administered 2017-05-27: 50 ug/kg/min via INTRAVENOUS
  Administered 2017-05-27: 40 ug/kg/min via INTRAVENOUS
  Administered 2017-05-28: 25 ug/kg/min via INTRAVENOUS
  Administered 2017-05-28: 30 ug/kg/min via INTRAVENOUS
  Administered 2017-05-28: 50 ug/kg/min via INTRAVENOUS
  Administered 2017-05-28: 25 ug/kg/min via INTRAVENOUS
  Administered 2017-05-28: 50 ug/kg/min via INTRAVENOUS
  Administered 2017-05-29: 25 ug/kg/min via INTRAVENOUS
  Filled 2017-05-26: qty 200
  Filled 2017-05-26 (×14): qty 100

## 2017-05-26 MED ORDER — MONTELUKAST SODIUM 10 MG PO TABS: 10.0000 mg | ORAL_TABLET | Freq: Every day | ORAL | Status: DC

## 2017-05-26 MED ORDER — PANTOPRAZOLE SODIUM 40 MG PO PACK
40.0000 mg | PACK | Freq: Every day | ORAL | Status: DC
  Administered 2017-05-26 – 2017-05-30 (×5): 40 mg
  Filled 2017-05-26 (×7): qty 20

## 2017-05-26 MED ORDER — MAGNESIUM SULFATE 2 GM/50ML IV SOLN
2.0000 g | Freq: Once | INTRAVENOUS | Status: AC
  Administered 2017-05-26: 2 g via INTRAVENOUS
  Filled 2017-05-26: qty 50

## 2017-05-26 MED ORDER — ASPIRIN 81 MG PO CHEW: 81.0000 mg | CHEWABLE_TABLET | Freq: Every day | ORAL | Status: DC

## 2017-05-26 MED ORDER — ETOMIDATE 2 MG/ML IV SOLN
INTRAVENOUS | Status: AC | PRN
Start: 2017-05-26 — End: 2017-05-26
  Administered 2017-05-26: 20 mg via INTRAVENOUS

## 2017-05-26 MED ORDER — HEPARIN SODIUM (PORCINE) 5000 UNIT/ML IJ SOLN
5000.0000 [IU] | Freq: Three times a day (TID) | INTRAMUSCULAR | Status: DC
  Administered 2017-05-26 – 2017-06-06 (×33): 5000 [IU] via SUBCUTANEOUS
  Filled 2017-05-26 (×35): qty 1

## 2017-05-26 MED ORDER — SUCCINYLCHOLINE CHLORIDE 20 MG/ML IJ SOLN
INTRAMUSCULAR | Status: AC | PRN
  Administered 2017-05-26: 100 mg via INTRAVENOUS

## 2017-05-26 MED ORDER — NITROGLYCERIN IN D5W 200-5 MCG/ML-% IV SOLN
0.0000 ug/min | Freq: Once | INTRAVENOUS | Status: AC
  Administered 2017-05-26: 5 ug/min via INTRAVENOUS
  Filled 2017-05-26: qty 250

## 2017-05-26 NOTE — Progress Notes (Signed)
Critical ABG results reported to MD Rancour

## 2017-05-26 NOTE — Progress Notes (Signed)
Critical ABG values given to Dr. Campos. 

## 2017-05-26 NOTE — ED Triage Notes (Signed)
Patient arrived with EMS from home reports worsening SOB with wheezing and productive cough this morning , she received Solumedrol 125 mg IV , Duoneb and Albuterol 5 mg nebulizer treatment prior to arrival with no relief .

## 2017-05-26 NOTE — ED Notes (Signed)
Has pure wick placed, on bipap, husband at bedside. Pt is alert, when calling her name, reports she is feeling better.

## 2017-05-26 NOTE — Progress Notes (Signed)
Transported pt from ED D36 to 2M16 on ventilator. Pt stable throughout with no complications. VS within normal limits. Fio2 Returned to 60% per previous vent settings. RT will continue to monitor.

## 2017-05-26 NOTE — Progress Notes (Signed)
ABG collected  

## 2017-05-26 NOTE — ED Notes (Signed)
Patient arrived with EMS with worsening SOB and a productive cough. On arrival, pt is in respiratory distress, BBS present with Rales/crackles throughout and expiratory wheezing. Pt is tachypneic. MD at bedside.

## 2017-05-26 NOTE — H&P (Signed)
PULMONARY / CRITICAL CARE MEDICINE   Name: Kelli Velasquez MRN: [id number] DOB: [birth year]    ADMISSION DATE:  05/26/2017  REFERRING MD:  EDP  CHIEF COMPLAINT:  Respiratory failure   HISTORY OF PRESENT ILLNESS:   76yo female former smoker (quit 1984) with hx CAD, HTN, CHF, BOOP followed by Dr. Melvyn Novas on chronic prednisone presented 10/31 with progressive SOB, cough, wheezing, BLE edema.  She had significant respiratory distress in ER and failed trial bipap requiring intubation.  Initial w/u revealed BNP >700, WBC 21.4 (chronic steroids), hypercarbic respiratory failure with pH 7.211, PCO2 99.4 prior to intubation.  PCCM called for ICU admission.   Per husband she has been more SOB over last 1-2 weeks.  Worsening orthopnea.    She denied chest pain, fever, leg/calf pain, purulent sputum, n/v/d.   PAST MEDICAL HISTORY :  She  has a past medical history of Allergy; Asthma; Chronic bronchitis ([place]); Coronary artery disease; Dyspnea; Full dentures; Gallstones; GERD (gastroesophageal reflux disease); History of echocardiogram; History of nuclear stress test; Hyperlipidemia; Hypertension; Myocardial infarction ([place]); Myopia; Neoplasm of skin; Nocturnal oxygen desaturation; Obesity; Osteoarthritis; Overactive bladder; Oxygen deficiency; Rash; Retaining fluid; Urinary incontinence; Vertigo; and Wears glasses.  PAST SURGICAL HISTORY: She  has a past surgical history that includes Total knee arthroplasty (2010); vocal cord polypectomy; Cholecystectomy (92); Joint replacement (2007); Dilation and curettage of uterus (1984); Colonoscopy; Coronary angioplasty with stent (07/2009); Incisional hernia repair (N/A, 05/16/2013); Insertion of mesh (N/A, 05/16/2013); Video bronchoscopy (Bilateral, 07/05/2013); Cataract extraction w/PHACO (Right, 12/16/2016); and Cataract extraction w/PHACO (Left, 02/03/2017).  Allergies  Allergen Reactions  . Fish Allergy Anaphylaxis  . Shellfish Allergy Anaphylaxis  . Aspirin      REACTION: nausea and vomiting High doses  . Atorvastatin     REACTION: leg pain  . Crestor [Rosuvastatin Calcium] Other (See Comments)    Muscle pain - allergy/intolerance  . Penicillins     Due to mold allergy per pt  . Simvastatin     REACTION: muscle pain  . Trandolapril     REACTION: leg pain    No current facility-administered medications on file prior to encounter.    Current Outpatient Prescriptions on File Prior to Encounter  Medication Sig  . acetaminophen (TYLENOL) 325 MG tablet Take 650 mg by mouth every 6 (six) hours as needed for mild pain. Take as needed per bottle   . acetaminophen-codeine (TYLENOL #3) 300-30 MG tablet One every 4 hours as needed for cough  . amLODipine (NORVASC) 10 MG tablet Take 1 tablet (10 mg total) by mouth daily.  Marland Kitchen aspirin 81 MG tablet Take 81 mg by mouth daily.  . chlorpheniramine (CHLOR-TRIMETON) 4 MG tablet Take 4 mg by mouth every morning.   . cromolyn (NASALCROM) 5.2 MG/ACT nasal spray Place 2 sprays into both nostrils daily as needed for allergies.  . cyanocobalamin (,VITAMIN B-12,) 1000 MCG/ML injection Inject 1,000 mcg into the muscle every 3 (three) months.  . Cyanocobalamin (VITAMIN B12) 3000 MCG SUBL Place 1 capsule under the tongue daily.  Marland Kitchen dextromethorphan-guaiFENesin (MUCINEX DM) 30-600 MG 12hr tablet Take 1 tablet by mouth 2 (two) times daily as needed for cough.  . EPIPEN 2-PAK 0.3 MG/0.3ML SOAJ injection INJECT 0.3 MLS (0.3 MG TOTAL) INTO THE MUSCLE ONCE AS NEEDED FOR ALLERGIC REACTION  . esomeprazole (NEXIUM) 40 MG capsule Take 1 capsule (40 mg total) by mouth daily.  Marland Kitchen ezetimibe (ZETIA) 10 MG tablet TAKE 1 TABLET (10 MG TOTAL) BY MOUTH DAILY.  . furosemide (LASIX) 20  MG tablet TAKE 1 TABLET BY MOUTH EVERY DAY  . furosemide (LASIX) 40 MG tablet Take 1 tablet (40 mg total) by mouth daily. Take 1 daily as needed for extra swelling (Patient taking differently: Take 40 mg by mouth daily as needed for fluid. Take 1 daily as needed  for extra swelling )  . montelukast (SINGULAIR) 10 MG tablet Take 1 tablet (10 mg total) by mouth at bedtime.  . Multiple Vitamin (MULTIVITAMIN) capsule Take 1 capsule by mouth daily.  . OXYGEN Inhale 3 L into the lungs continuous. 24/7 2 lpm  [facility]  . potassium chloride SA (KLOR-CON M20) 20 MEQ tablet Take 1 tablet (20 mEq total) by mouth daily as needed. (Patient taking differently: Take 20 mEq by mouth daily. )  . PREDNISONE PO Take 15-20 mg by mouth daily. Take 20 mg one day and 15 mg the next days  . PROAIR HFA 108 (90 BASE) MCG/ACT inhaler INHALE 2 PUFFS BY MOUTH EVERY 4 HOURS AS NEEDED FOR WHEEZING OR FOR SHORTNESS OF BREATH  . ranitidine (ZANTAC) 75 MG tablet Take 300 mg by mouth at bedtime.     FAMILY HISTORY:  Her indicated that her mother is deceased. She indicated that her father is deceased. She indicated that the status of her maternal grandmother is unknown. She indicated that the status of her neg hx is unknown.    SOCIAL HISTORY: She  reports that she quit smoking about 34 years ago. Her smoking use included Cigarettes. She has a 25.00 pack-year smoking history. She has never used smokeless tobacco. She reports that she drinks alcohol. She reports that she does not use drugs.  REVIEW OF SYSTEMS:   As per HPI - All other systems reviewed and were neg.    SUBJECTIVE:    VITAL SIGNS: BP 118/84   Pulse 90   Temp 97.6 F (36.4 C) (Axillary)   Resp (!) 24   Ht 5' 4.75" (1.645 m)   Wt 112 kg (247 lb)   LMP 07/27/1980   SpO2 97%   BMI 41.42 kg/m   HEMODYNAMICS:    VENTILATOR SETTINGS: Vent Mode: PRVC FiO2 (%):  [60 %-100 %] 60 % Set Rate:  [24 bmp] 24 bmp Vt Set:  [460 mL] 460 mL PEEP:  [5 cmH20] 5 cmH20 Plateau Pressure:  [25 cmH20] 25 cmH20  INTAKE / OUTPUT: No intake/output data recorded.  PHYSICAL EXAMINATION: General:  Obese female, NAD on vent  Neuro:  Sedated, RASS -1, follows commands, opens eyes to loud voice  HEENT:  Mm moist, difficult to  assess JVD Cardiovascular:  s1s2 rrr Lungs:  resps even non labored on vent, diminished bases Abdomen:  Round, soft, obese  Musculoskeletal:  Warm and dry, scant BLE  LABS:  BMET  Recent Labs Lab 05/26/17 0610  NA 144  K 4.2  CL 98*  CO2 34*  BUN 17  CREATININE 0.90  GLUCOSE 172*    Electrolytes  Recent Labs Lab 05/26/17 0610  CALCIUM 9.2    CBC  Recent Labs Lab 05/26/17 0610  WBC 21.4*  HGB 13.8  HCT 47.8*  PLT 258    Coag's No results for input(s): APTT, INR in the last 168 hours.  Sepsis Markers No results for input(s): LATICACIDVEN, PROCALCITON, O2SATVEN in the last 168 hours.  ABG  Recent Labs Lab 05/26/17 0635 05/26/17 0831  PHART 7.211* 7.215*  PCO2ART 99.4* 102*  PO2ART 85.5 77.9*    Liver Enzymes No results for input(s): AST, ALT, ALKPHOS, BILITOT,  ALBUMIN in the last 168 hours.  Cardiac Enzymes  Recent Labs Lab 05/26/17 0610  TROPONINI 0.04*    Glucose No results for input(s): GLUCAP in the last 168 hours.  Imaging Dg Chest Portable 1 View  Result Date: 05/26/2017 CLINICAL DATA:  Intubation. EXAM: PORTABLE CHEST 1 VIEW COMPARISON:  Earlier today FINDINGS: Endotracheal tube tip at the clavicular heads. An orogastric tube reaches the stomach. Symmetric interstitial and airspace opacities. Cardiomegaly and vascular pedicle widening. EKG leads create artifact over the chest. IMPRESSION: 1. Unremarkable positioning of new endotracheal and orogastric tubes. 2. CHF pattern. Electronically Signed   By: Monte Fantasia M.D.   On: 05/26/2017 09:11   Dg Chest Portable 1 View  Result Date: 05/26/2017 CLINICAL DATA:  Worsening shortness of breath.  Wheezing. EXAM: PORTABLE CHEST 1 VIEW COMPARISON:  Frontal and lateral views 01/11/2017 FINDINGS: Low lung volumes. Cardiomegaly is similar allowing for lower lung volumes. Increased interstitial and bronchial thickening from prior exam. Probable bilateral pleural effusions and bibasilar  atelectasis. No pneumothorax. Grossly stable osseous structures. IMPRESSION: Low lung volumes with increased interstitial and bronchial thickening suspicious for pulmonary edema. Probable bilateral pleural effusions and bibasilar atelectasis. Findings suspicious for CHF. Electronically Signed   By: Jeb Levering M.D.   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 Afebrile, no obvious infiltrate.  P:   Blood cultures  Check pct  Hold off on abx for now   ENDOCRINE Hyperglycemia   P:   SSI   NEUROLOGIC AMS - r/t hypercarbia  Sedation needs on vent  P:   RASS goal: -1 Propofol, PRN fent    FAMILY  - Updates: husband updated at length at bedside 10/31  - Inter-disciplinary family meet or Palliative Care meeting due by:  [place], NP 05/26/2017  9:44 AM Pager: [phone] or [phone]

## 2017-05-26 NOTE — ED Notes (Signed)
Dr. Wyvonnia Dusky notified on pt.'s elevated [place] level .

## 2017-05-26 NOTE — ED Provider Notes (Signed)
[facility] EMERGENCY DEPARTMENT Provider Note   CSN: [id number] Arrival date & time: 05/26/17  0534     History   Chief Complaint Chief Complaint  Patient presents with  . Shortness of Breath    HPI Kelli Velasquez is a 76 y.o. female.  Level 5 caveat for respiratory distress.  Patient brought from home with shortness of breath since about 1 AM.  Associated with coughing and wheezing.  Received Solu-Medrol and duo nebs from EMS.  Husband reports history of BOOP and chronic lung disease on chronic steroids.  She also has a history of CHF.  Denies any chest pain or fever.  Denies any leg pain but has mild leg swelling.  States compliance with her medications.  States she has had some congestion and rhinorrhea as well she arrives tachypneic speaking in short phrases and is placed on BiPAP on arrival   The history is provided by the patient, the EMS personnel and a relative.  Shortness of Breath     Past Medical History:  Diagnosis Date  . Allergy    allergic rhinitis  . Asthma   . Chronic bronchitis ([place])   . Coronary artery disease    cath January 2011 with DEs LAD and RCA  . Dyspnea   . Full dentures   . Gallstones   . GERD (gastroesophageal reflux disease)   . History of echocardiogram    Echo 5/17: mod LVH, EF 50-55%, ant-septal HK, Gr 1 DD, mod LAE //  b.  Echo 7/17: mild concentric LVH, EF 45-50%, inf-lat, inf, inf-septal HK, mild LAE  . History of nuclear stress test    Myoview 7/17: EF 48%, small mild apical defect, no ischemia, low risk  . Hyperlipidemia   . Hypertension   . Myocardial infarction ([place])    subendocardial, initial episode, 2010 two stents placed  . Myopia   . 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 Nocturnal oxygen desaturation    o2 at night  . Obesity   . Osteoarthritis    knees, fingers, shoulders  . Overactive bladder   . Oxygen deficiency    uses oxygen all day  . Rash    and other non  specific skin eruptions  . Retaining fluid    in ankles and feet  . Urinary incontinence   . Vertigo   . Wears glasses     Patient Active Problem List   Diagnosis Date Noted  . Encounter for screening mammogram for breast cancer 02/02/2017  . Chronic combined systolic and diastolic CHF, NYHA class 2 ([place]) 02/02/2017  . Depressed mood 12/02/2016  . Routine general medical examination at a health care facility 12/30/2015  . Supraumbilical hernia 67/20/9470  . Lump in the abdomen 04/10/2015  . GERD (gastroesophageal reflux disease) 10/10/2014  . B12 deficiency 10/09/2014  . Skin lesions 10/09/2014  . Cataracts, bilateral 10/09/2014  . Dry eyes 10/09/2014  . Fatigue 05/08/2014  . Pedal edema 05/08/2014  . Shoulder tendonitis 01/24/2014  . Osteopenia 11/28/2013  . Estrogen deficiency 11/02/2013  . Dyspnea on exertion 06/27/2013  . Hernia, incisional 04/03/2013  . 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 Cough 12/18/2012  . Chronic respiratory failure with hypoxia and hypercapnia ([place]) 10/26/2012  . Special screening for malignant neoplasms, colon 06/16/2011  . DYSPHONIA 10/10/2010  . HYPERGLYCEMIA 05/27/2010  . CAD, NATIVE VESSEL 08/19/2009  . 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 Severe obesity (BMI >=  40) ([place]) 09/01/2007  . MYOPIA 03/03/2007  . Hyperlipidemia 10/15/2006  . Essential hypertension 10/15/2006  . Allergic rhinitis 10/15/2006  . Cough variant asthma vs UACS  10/15/2006  . OVERACTIVE BLADDER 10/15/2006  . OSTEOARTHRITIS 10/15/2006  . Urinary incontinence 10/15/2006    Past Surgical History:  Procedure Laterality Date  . CATARACT EXTRACTION W/PHACO Right 12/16/2016   Procedure: CATARACT EXTRACTION PHACO AND INTRAOCULAR LENS PLACEMENT ([place])  right;  Surgeon: Leandrew Koyanagi, MD;  Location: [facility];  Service: Ophthalmology;  Laterality: Right;  . CATARACT EXTRACTION W/PHACO Left 02/03/2017   Procedure:  CATARACT EXTRACTION PHACO AND INTRAOCULAR LENS PLACEMENT ([place])  Left  Complicated;  Surgeon: Leandrew Koyanagi, MD;  Location: [facility];  Service: Ophthalmology;  Laterality: Left;  [place] Uses oxygen   . CHOLECYSTECTOMY  92  . COLONOSCOPY    . CORONARY ANGIOPLASTY WITH STENT PLACEMENT  07/2009   stent  . DILATION AND CURETTAGE OF UTERUS  1984  . INCISIONAL HERNIA REPAIR N/A 05/16/2013   Procedure: HERNIA REPAIR INFRAUMILICAL INCISIONAL;  Surgeon: Joyice Faster. Cornett, MD;  Location: [facility];  Service: General;  Laterality: N/A;  umbilical  . INSERTION OF MESH N/A 05/16/2013   Procedure: INSERTION OF MESH;  Surgeon: Joyice Faster. Cornett, MD;  Location: [facility];  Service: General;  Laterality: N/A;  umbilical  . JOINT REPLACEMENT  2007   right total knee replacement  . TOTAL KNEE ARTHROPLASTY  2010   left , then vocal cord infection post op  . VIDEO BRONCHOSCOPY Bilateral 07/05/2013   Procedure: VIDEO BRONCHOSCOPY WITH FLUORO;  Surgeon: Tanda Rockers, MD;  Location: [facility];  Service: Cardiopulmonary;  Laterality: Bilateral;  . vocal cord polypectomy      OB History    No data available       Home Medications    Prior to Admission medications   Medication Sig Start Date End Date Taking? Authorizing Provider  acetaminophen (TYLENOL) 325 MG tablet Take as needed per bottle    [provider]  acetaminophen-codeine (TYLENOL #3) 300-30 MG tablet One every 4 hours as needed for cough 06/23/16   Tanda Rockers, MD  amLODipine (NORVASC) 10 MG tablet Take 1 tablet (10 mg total) by mouth daily. 02/02/17   Tower, Wynelle Fanny, MD  aspirin 81 MG tablet Take 81 mg by mouth daily.    [provider]  chlorpheniramine (CHLOR-TRIMETON) 4 MG tablet 4 mg. Take 1  In the morning and 2 at bedtime    [provider]  cromolyn (NASALCROM) 5.2 MG/ACT nasal spray Place 2 sprays into both nostrils daily as needed for allergies.     [provider]  cyanocobalamin (,VITAMIN B-12,) 1000 MCG/ML injection Inject 1,000 mcg into the muscle every 3 (three) months.    [provider]  Cyanocobalamin (VITAMIN B12) 3000 MCG SUBL Place 1 capsule under the tongue daily.    [provider]  dextromethorphan-guaiFENesin (MUCINEX DM) 30-600 MG 12hr tablet Take 1 tablet by mouth 2 (two) times daily as needed for cough.    [provider]  EPIPEN 2-PAK 0.3 MG/0.3ML SOAJ injection INJECT 0.3 MLS (0.3 MG TOTAL) INTO THE MUSCLE ONCE AS NEEDED FOR ALLERGIC REACTION 02/07/15   Tower, Wynelle Fanny, MD  esomeprazole (NEXIUM) 40 MG capsule Take 1 capsule (40 mg total) by mouth daily. 02/02/17   Tower, Wynelle Fanny, MD  ezetimibe (ZETIA) 10 MG tablet TAKE 1 TABLET (10 MG TOTAL) BY MOUTH DAILY. 02/02/17  Tower, Wynelle Fanny, MD  furosemide (LASIX) 20 MG tablet TAKE 1 TABLET BY MOUTH EVERY DAY 03/30/17   Tanda Rockers, MD  furosemide (LASIX) 40 MG tablet Take 1 tablet (40 mg total) by mouth daily. Take 1 daily as needed for extra swelling 04/26/17   Tanda Rockers, MD  montelukast (SINGULAIR) 10 MG tablet Take 1 tablet (10 mg total) by mouth at bedtime. 02/02/17   Tower, Wynelle Fanny, MD  Multiple Vitamin (MULTIVITAMIN) capsule Take 1 capsule by mouth daily.    [provider]  OXYGEN Inhale into the lungs. 24/7 2 lpm  [facility]    [provider]  potassium chloride SA (KLOR-CON M20) 20 MEQ tablet Take 1 tablet (20 mEq total) by mouth daily as needed. 02/02/17   Tower, Wynelle Fanny, MD  PREDNISONE PO Take 20 mg by mouth.     [provider]  PROAIR HFA 108 (90 BASE) MCG/ACT inhaler INHALE 2 PUFFS BY MOUTH EVERY 4 HOURS AS NEEDED FOR WHEEZING OR FOR SHORTNESS OF BREATH 02/08/15   Tower, Wynelle Fanny, MD  ranitidine (ZANTAC) 75 MG tablet Take 300 mg by mouth at bedtime.     [provider]    Family History Family History  Problem Relation Age of Onset  . Stroke Mother   . Diabetes Mother 21  . Stroke Father   .  Heart failure Father   . Breast cancer Maternal Grandmother   . Colon cancer Neg Hx     Social History Social History  Substance Use Topics  . Smoking status: Former Smoker    Packs/day: 1.00    Years: 25.00    Types: Cigarettes    Quit date: 07/27/1982  . Smokeless tobacco: Never Used  . Alcohol use 0.0 oz/week     Comment: wine-rare     Allergies   Shellfish allergy; Aspirin; Atorvastatin; Crestor [rosuvastatin calcium]; Fish allergy; Penicillins; Simvastatin; and Trandolapril   Review of Systems Review of Systems  Unable to perform ROS: Severe respiratory distress  Respiratory: Positive for shortness of breath.      Physical Exam Updated Vital Signs BP (!) 199/90   Pulse (!) 103   Temp 97.6 F (36.4 C) (Axillary)   Resp (!) 24   Ht 5' 4.75" (1.645 m)   Wt 112 kg (247 lb)   LMP 07/27/1980   SpO2 96%   BMI 41.42 kg/m   Physical Exam  Constitutional: She is oriented to person, place, and time. She appears well-developed and well-nourished. She appears distressed.  Moderate respiratory distress, speaking in short phrases  HENT:  Head: Normocephalic and atraumatic.  Mouth/Throat: Oropharynx is clear and moist. No oropharyngeal exudate.  Eyes: Pupils are equal, round, and reactive to light. Conjunctivae and EOM are normal.  Neck: Normal range of motion. Neck supple.  No meningismus.  Cardiovascular: Normal rate, regular rhythm, normal heart sounds and intact distal pulses.   No murmur heard. Pulmonary/Chest: She is in respiratory distress. She has wheezes. She has rales.  Tripoding on arrival, bibasilar crackles, scattered expiratory wheezing  Abdominal: Soft. There is no tenderness. There is no rebound and no guarding.  Musculoskeletal: Normal range of motion. She exhibits edema. She exhibits no tenderness.  +`1 edema to knees  Neurological: She is alert and oriented to person, place, and time. No cranial nerve deficit. She exhibits normal muscle tone.  Coordination normal.   5/5 strength throughout. CN 2-12 intact.Equal grip strength.   Skin: Skin is warm. Capillary refill takes less than  2 seconds.  Psychiatric: She has a normal mood and affect. Her behavior is normal.  Nursing note and vitals reviewed.    ED Treatments / Results  Labs (all labs ordered are listed, but only abnormal results are displayed) Labs Reviewed  CBC WITH DIFFERENTIAL/PLATELET - Abnormal; Notable for the following:       Result Value   WBC 21.4 (*)    HCT 47.8 (*)    MCV 103.7 (*)    MCHC 28.9 (*)    RDW 16.9 (*)    Neutro Abs 16.7 (*)    Monocytes Absolute 1.4 (*)    All other components within normal limits  BASIC METABOLIC PANEL - Abnormal; Notable for the following:    Chloride 98 (*)    CO2 34 (*)    Glucose, Bld 172 (*)    All other components within normal limits  TROPONIN I - Abnormal; Notable for the following:    Troponin I 0.04 (*)    All other components within normal limits  BRAIN NATRIURETIC PEPTIDE - Abnormal; Notable for the following:    B Natriuretic Peptide 760.9 (*)    All other components within normal limits  BLOOD GAS, ARTERIAL - Abnormal; Notable for the following:    pH, Arterial 7.211 (*)    pCO2 arterial 99.4 (*)    Bicarbonate 38.7 (*)    Acid-Base Excess 10.7 (*)    All other components within normal limits  BLOOD GAS, ARTERIAL  I-STAT ARTERIAL BLOOD GAS, ED    EKG  EKG Interpretation  Date/Time:  Wednesday May 26 2017 05:45:04 EDT Ventricular Rate:  108 PR Interval:    QRS Duration: 92 QT Interval:  329 QTC Calculation: 441 R Axis:   21 Text Interpretation:  Sinus tachycardia Multiple premature complexes, vent & supraven Low voltage, precordial leads Borderline T wave abnormalities Rate faster Confirmed by Ezequiel Essex [phone]) on 05/26/2017 6:19:25 AM       Radiology Dg Chest Portable 1 View  Result Date: 05/26/2017 CLINICAL DATA:  Worsening shortness of breath.  Wheezing. EXAM: PORTABLE  CHEST 1 VIEW COMPARISON:  Frontal and lateral views 01/11/2017 FINDINGS: Low lung volumes. Cardiomegaly is similar allowing for lower lung volumes. Increased interstitial and bronchial thickening from prior exam. Probable bilateral pleural effusions and bibasilar atelectasis. No pneumothorax. Grossly stable osseous structures. IMPRESSION: Low lung volumes with increased interstitial and bronchial thickening suspicious for pulmonary edema. Probable bilateral pleural effusions and bibasilar atelectasis. Findings suspicious for CHF. Electronically Signed   By: Jeb Levering M.D.   On: 05/26/2017 06:27    Procedures Procedures (including critical care time)  Medications Ordered in ED Medications  furosemide (LASIX) injection 40 mg (not administered)  ipratropium-albuterol (DUONEB) 0.5-2.5 (3) MG/3ML nebulizer solution 3 mL (3 mLs Nebulization Given 05/26/17 0612)     Initial Impression / Assessment and Plan / ED Course  I have reviewed the triage vital signs and the nursing notes.  Pertinent labs & imaging results that were available during my care of the patient were reviewed by me and considered in my medical decision making (see chart for details).    Respiratory distress with history of restrictive lung disease, CHF, Home O2, chronic steroids, BOOP.  Denies chest pain.  Bipap on arrival, nebs, steroids, magnesium IV lasix Given. NTG gtt started.  CXR confirms CHF.  ABG with significant CO2 retention and hypercarbia. Leukocytosis on labs, worse than baseline.  Patient tolerating bipap well. Answers questions and arouses, moving air well.  Admission d/w  NP Lissa Merlin of hospitalist service who requests repeat ABG before accepting patient.   Dr. Venora Maples to assume care at shift change.   CRITICAL CARE Performed by: Ezequiel Essex Total critical care time: 45 minutes Critical care time was exclusive of separately billable procedures and treating other patients. Critical care was  necessary to treat or prevent imminent or life-threatening deterioration. Critical care was time spent personally by me on the following activities: development of treatment plan with patient and/or surrogate as well as nursing, discussions with consultants, evaluation of patient's response to treatment, examination of patient, obtaining history from patient or surrogate, ordering and performing treatments and interventions, ordering and review of laboratory studies, ordering and review of radiographic studies, pulse oximetry and re-evaluation of patient's condition.   Final Clinical Impressions(s) / ED Diagnoses   Final diagnoses:  Acute on chronic systolic congestive heart failure (HCC)  COPD exacerbation (HCC)  Acute respiratory failure with hypercapnia [facility])    New Prescriptions New Prescriptions   No medications on file     Ezequiel Essex, MD 05/26/17 [phone]

## 2017-05-26 NOTE — ED Notes (Signed)
Plan to intubate patient, due to increased CO2, altered mental, difficult to arouse.

## 2017-05-26 NOTE — ED Provider Notes (Signed)
9:07 AM Worsening gas despite trial of bipap. Mechanical ventilation now. Family updated. Tolerated the procedure well. Post intubation cxr shows tube in good place. PCCM to evaluate and admit to ICU      INTUBATION Performed by: Hoy Morn Required items: required blood products, implants, devices, and special equipment available Patient identity confirmed: provided demographic data and hospital-assigned identification number Time out: Immediately prior to procedure a "time out" was called to verify the correct patient, procedure, equipment, support staff and site/side marked as required. Indications: respiratory failure Intubation method: Glidescope Laryngoscopy  Preoxygenation: BIPAP Sedatives: Etomidate Paralytic: Succinylcholine Tube Size: 7.5 cuffed Post-procedure assessment: chest rise and ETCO2 monitor Breath sounds: equal and absent over the epigastrium Tube secured with: ETT holder Chest x-ray interpreted by radiologist and me. Chest x-ray findings: endotracheal tube in appropriate position Patient tolerated the procedure well with no immediate complications.      Jola Schmidt, MD 05/26/17 [phone]

## 2017-05-26 NOTE — Progress Notes (Signed)
  Echocardiogram 2D Echocardiogram has been performed.  Jannett Celestine 05/26/2017, 2:02 PM

## 2017-05-27 ENCOUNTER — Inpatient Hospital Stay (HOSPITAL_COMMUNITY): Payer: Medicare Other

## 2017-05-27 DIAGNOSIS — G9340 Encephalopathy, unspecified: Secondary | ICD-10-CM

## 2017-05-27 DIAGNOSIS — I509 Heart failure, unspecified: Secondary | ICD-10-CM

## 2017-05-27 DIAGNOSIS — J810 Acute pulmonary edema: Secondary | ICD-10-CM

## 2017-05-27 DIAGNOSIS — I5023 Acute on chronic systolic (congestive) heart failure: Secondary | ICD-10-CM

## 2017-05-27 LAB — GLUCOSE, CAPILLARY
GLUCOSE-CAPILLARY: 148 mg/dL — AB (ref 65–99)
GLUCOSE-CAPILLARY: 124 mg/dL — AB (ref 65–99)
GLUCOSE-CAPILLARY: 109 mg/dL — AB (ref 65–99)
GLUCOSE-CAPILLARY: 173 mg/dL — AB (ref 65–99)
Glucose-Capillary: 147 mg/dL — ABNORMAL HIGH (ref 65–99)
Glucose-Capillary: 191 mg/dL — ABNORMAL HIGH (ref 65–99)

## 2017-05-27 LAB — CBC
HEMATOCRIT: 38.1 % (ref 36.0–46.0)
HEMOGLOBIN: 11.3 g/dL — AB (ref 12.0–15.0)
MCH: 29.9 pg (ref 26.0–34.0)
MCHC: 29.7 g/dL — AB (ref 30.0–36.0)
MCV: 100.8 fL — ABNORMAL HIGH (ref 78.0–100.0)
Platelets: 164 10*3/uL (ref 150–400)
RBC: 3.78 MIL/uL — ABNORMAL LOW (ref 3.87–5.11)
RDW: 15.9 % — ABNORMAL HIGH (ref 11.5–15.5)
WBC: 18.5 10*3/uL — ABNORMAL HIGH (ref 4.0–10.5)

## 2017-05-27 LAB — BASIC METABOLIC PANEL
Anion gap: 11 (ref 5–15)
BUN: 21 mg/dL — ABNORMAL HIGH (ref 6–20)
CHLORIDE: 97 mmol/L — AB (ref 101–111)
CO2: 35 mmol/L — AB (ref 22–32)
CREATININE: 0.9 mg/dL (ref 0.44–1.00)
Calcium: 8.9 mg/dL (ref 8.9–10.3)
GFR calc non Af Amer: >60 mL/min (ref >60)
Glucose, Bld: 146 mg/dL — ABNORMAL HIGH (ref 65–99)
Potassium: 3.5 mmol/L (ref 3.5–5.1)
Sodium: 143 mmol/L (ref 135–145)

## 2017-05-27 LAB — TROPONIN I: Troponin I: 0.04 ng/mL (ref <0.03)

## 2017-05-27 MED ORDER — METOLAZONE 5 MG PO TABS
5.0000 mg | ORAL_TABLET | Freq: Every day | ORAL | Status: AC
  Administered 2017-05-27: 5 mg via ORAL
  Filled 2017-05-27: qty 1

## 2017-05-27 MED ORDER — DEXTROSE 5 % IV SOLN
10.0000 mg/h | INTRAVENOUS | Status: AC
  Administered 2017-05-27: 10 mg/h via INTRAVENOUS
  Filled 2017-05-27: qty 25

## 2017-05-27 MED ORDER — POTASSIUM CHLORIDE 20 MEQ/15ML (10%) PO SOLN
40.0000 meq | Freq: Four times a day (QID) | ORAL | Status: AC
  Administered 2017-05-27 – 2017-05-28 (×3): 40 meq
  Filled 2017-05-27 (×3): qty 30

## 2017-05-27 MED ORDER — POTASSIUM CHLORIDE 10 MEQ/100ML IV SOLN
10.0000 meq | INTRAVENOUS | Status: AC
  Administered 2017-05-27 (×4): 10 meq via INTRAVENOUS
  Filled 2017-05-27 (×4): qty 100

## 2017-05-27 MED ORDER — SPIRONOLACTONE 12.5 MG HALF TABLET
12.5000 mg | ORAL_TABLET | Freq: Every day | ORAL | Status: DC
  Administered 2017-05-27 – 2017-05-30 (×4): 12.5 mg via ORAL
  Filled 2017-05-27 (×5): qty 1

## 2017-05-27 NOTE — Progress Notes (Signed)
PULMONARY / CRITICAL CARE MEDICINE   Name: Kelli Velasquez MRN: [id number] DOB: 08-29-1940    ADMISSION DATE:  05/26/2017  REFERRING MD:  EDP  CHIEF COMPLAINT:  Respiratory failure   HISTORY OF PRESENT ILLNESS:   76yo female former smoker (quit 1984) with hx CAD, HTN, CHF, BOOP followed by Dr. Melvyn Novas on chronic prednisone presented 10/31 with progressive SOB, cough, wheezing, BLE edema.  She had significant respiratory distress in ER and failed trial bipap requiring intubation.  Initial w/u revealed BNP >700, WBC 21.4 (chronic steroids), hypercarbic respiratory failure with pH 7.211, PCO2 99.4 prior to intubation.  PCCM called for ICU admission.   Per husband she has been more SOB over last 1-2 weeks.  Worsening orthopnea.    She denied chest pain, fever, leg/calf pain, purulent sputum, n/v/d.   SUBJECTIVE:   Husband and son at bedside. Husband is retired EMT, reports that patient had sudden worsening of SOB around 0300 10/31, but has been having increased home O2 requirement x 1 week. Home lasix dose is 20mg  QD. CHf hx - lexiscan 07/17 with no signficant ischemia, hx of DES x 3 8 years ago.   VITAL SIGNS: BP 124/69 (BP Location: Left Wrist)   Pulse 64   Temp 97.7 F (36.5 C) (Axillary)   Resp 20   Ht 5\' 4"  (1.626 m)   Wt 119 kg (262 lb 5.6 oz)   LMP 07/27/1980   SpO2 96%   BMI 45.03 kg/m   HEMODYNAMICS:    VENTILATOR SETTINGS: Vent Mode: PRVC FiO2 (%):  [50 %-60 %] 50 % Set Rate:  [20 bmp] 20 bmp Vt Set:  [460 mL] 460 mL PEEP:  [8 cmH20] 8 cmH20 Plateau Pressure:  [19 cmH20-24 cmH20] 22 cmH20  INTAKE / OUTPUT: I/O last 3 completed shifts: In: 603.8 [I.V.:553.8; IV Piggyback:50] Out: 1050 [Urine:1050]  PHYSICAL EXAMINATION:  General:  Obese female, NAD on vent  Neuro:  Sedated, moves extremities to touch, RASS -2 HEENT:  Mm moist, difficult to assess JVD Cardiovascular:  s1s2 rrr Lungs:  resps even non labored on vent, diminished bases Abdomen:  Round, soft, obese   Musculoskeletal:  Warm and dry, scant BLE  LABS:  BMET  Recent Labs Lab 05/26/17 0610 05/26/17 1124 05/27/17 0249  NA 144  --  143  K 4.2  --  3.5  CL 98*  --  97*  CO2 34*  --  35*  BUN 17  --  21*  CREATININE 0.90 1.01* 0.90  GLUCOSE 172*  --  146*    Electrolytes  Recent Labs Lab 05/26/17 0610 05/27/17 0249  CALCIUM 9.2 8.9    CBC  Recent Labs Lab 05/26/17 0610 05/26/17 1124 05/27/17 0249  WBC 21.4* 15.9* 18.5*  HGB 13.8 11.7* 11.3*  HCT 47.8* 41.4 38.1  PLT 258 149* 164    Coag's No results for input(s): APTT, INR in the last 168 hours.  Sepsis Markers  Recent Labs Lab 05/26/17 1124  PROCALCITON <0.10    ABG  Recent Labs Lab 05/26/17 0635 05/26/17 0831 05/26/17 1032  PHART 7.211* 7.215* 7.326*  PCO2ART 99.4* 102* 79.5*  PO2ART 85.5 77.9* 81.0*    Liver Enzymes No results for input(s): AST, ALT, ALKPHOS, BILITOT, ALBUMIN in the last 168 hours.  Cardiac Enzymes  Recent Labs Lab 05/26/17 1124 05/26/17 1848 05/27/17 0038  TROPONINI 0.05* 0.04* 0.04*    Glucose  Recent Labs Lab 05/26/17 1246 05/26/17 1630 05/26/17 2121 05/27/17 0100 05/27/17 0534 05/27/17 [place]  232* 207* 139* 147* 148* 124*    Imaging Dg Chest Port 1 View  Result Date: 05/27/2017 CLINICAL DATA:  Respiratory failure.  Shortness of breath. EXAM: PORTABLE CHEST 1 VIEW COMPARISON:  05/26/2017 FINDINGS: The endotracheal tube is in good position, 3.9 cm above the carina. The NG tube is coursing down the esophagus and into the stomach. Persistent cardiac enlargement, pulmonary edema and pleural effusions. IMPRESSION: Stable support apparatus. Persistent cardiac enlargement, pulmonary edema and pleural effusions. Electronically Signed   By: Marijo Sanes M.D.   On: 05/27/2017 07:28   Dg Chest Portable 1 View  Result Date: 05/26/2017 CLINICAL DATA:  Intubation. EXAM: PORTABLE CHEST 1 VIEW COMPARISON:  Earlier today FINDINGS: Endotracheal tube tip at the  clavicular heads. An orogastric tube reaches the stomach. Symmetric interstitial and airspace opacities. Cardiomegaly and vascular pedicle widening. EKG leads create artifact over the chest. IMPRESSION: 1. Unremarkable positioning of new endotracheal and orogastric tubes. 2. CHF pattern. Electronically Signed   By: Monte Fantasia M.D.   On: 05/26/2017 09:11    STUDIES:  Echo 10/31>>> EF 35-40%, diffuse hypokinesis  CULTURES: BC x 2 10/31>>>  ANTIBIOTICS: None to date.   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 Echo with EF 35-40% and diffuse hypokinesis.  Diuresis as BP and Scr tol (s/p IV lasix 40mg  x 2), continue IV lasix  Hold home norvasc for now  BP control - initially required nitro now off   RENAL No active issue  P:   Monitor Scr with diuresis  F/u chem   GASTROINTESTINAL GERD  P:   PPI  NPO for now  Add TF  HEMATOLOGIC leukocytosis - likely r/t chronic steroids  P:  F/u cbc  Sq heparin   INFECTIOUS Leukocytosis - likely r/t steroids. Afebrile, no obvious infiltrate.  P:   PCT negative.  Blood cultures  Hold off on abx for now   ENDOCRINE Hyperglycemia   P:   SSI   NEUROLOGIC AMS - r/t hypercarbia  Sedation needs on vent  P:   RASS goal: -1 Propofol, PRN fent   FAMILY  - Updates: husband updated at length at bedside 11/1 - Inter-disciplinary family meet or Palliative Care meeting due by:  Day 7   Ralene Ok, MD PGY2, Family Medicine  Attending Note:  76 year old female with CHF and BOOP presenting with CHF  exacerbation, pulmonary edema and VDRF.  On exam, diffuse crackles.  I reviewed CXR myself, ETT ok, pulmonary edema noted.  Will start lasix drip.  Will add zaroxolyn.  BMET in AM.  Replace electrolytes.  Heart failure team to see patient.  2D echo EF 35-40% with diffuse hypokineses.  No family bedside.  Will hold off weaning today.  The patient is critically ill with multiple organ systems failure and requires high complexity decision making for assessment and support, frequent evaluation and titration of therapies, application of advanced monitoring technologies and extensive interpretation of multiple databases.   Critical Care Time devoted to patient care services described in this note is  35  Minutes. This time reflects time of care of this signee Dr Jennet Maduro. This critical care time does not reflect procedure time, or teaching time or supervisory time of PA/NP/Med student/Med Resident etc but could involve care discussion time.  Rush Farmer, M.D. [facility] Pulmonary/Critical Care Medicine. Pager: [phone]. After hours pager: [phone].

## 2017-05-27 NOTE — Consult Note (Signed)
Advanced Heart Failure Team Consult Note   Primary Physician: Primary Cardiologist: Dr Angelena Form Pulmonologist: Dr Melvyn Novas    Reason for Consultation: Heart Failure   HPI:    Kelli Velasquez is seen today for evaluation of heart failure at the request of Dr Nelda Marseille.   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 She has not been on bb due to wheezing and bronchitis.  Over the last 2 weeks she has had increased dyspnea with exertion, she was short of breath at rest on the day of admission.  Her husband says she has been taking 20 mg of lasix daily and occasionally an extra 20 mg of lasix.   Presented to [facility] ED with respiratory distress. Family reported increased dyspnea over the last week requiring 2 liters oxygen at home. In ED she received solumedrol and duonebs. Placed on Bipap but continue decline requiring intubation. CXR concerning for interstitial edema and bilateral effusions. Pertinent admission labs included: Creatinine 0.9, troponin 0.05>0.04>0.04, Procalcitonin < 0.1, WBC 15.9, hgb 11.7, BNP 760. Just started on lasix drip 8 mg per hour + metolazone 5 mg daily.   Echo 05/26/2017 EF 35-40%  Grade I DD RV normal Echo 2017 EF 45-50% with WMA  Myoview 2017 No significant ischemia EF 48%.   [place] 2011- DES LAD and RCA.   Review of Systems: [y] = yes, _0  = no  Patient is encephalopathic and or intubated. Therefore history has been obtained from chart review.   General: Weight gain _1 ; Weight loss _2 ; Anorexia _3 ; Fatigue _4 ; Fever _5 ; Chills _6 ; Weakness _7   Cardiac: Chest pain/pressure _8 ; Resting SOB _9 ; Exertional SOB _10 ; Orthopnea _11 ; Pedal Edema _12 ; Palpitations _13 ; Syncope _14 ; Presyncope _15 ; Paroxysmal nocturnal dyspnea_16   Pulmonary: Cough _17 ; Wheezing_18 ; Hemoptysis_19 ; Sputum _20 ; Snoring _21   GI: Vomiting_22 ; Dysphagia_23 ; Melena_24 ; Hematochezia _25 ; Heartburn_26 ;  Abdominal pain _27 ; Constipation _28 ; Diarrhea _29 ; BRBPR _30   GU: Hematuria_31 ; Dysuria _32 ; Nocturia_33   Vascular: Pain in legs with walking _34 ; Pain in feet with lying flat _35 ; Non-healing sores _36 ; Stroke _37 ; TIA _38 ; Slurred speech _39 ;  Neuro: Headaches_40 ; Vertigo_41 ; Seizures_42 ; Paresthesias_43 ;Blurred vision _44 ; Diplopia _45 ; Vision changes _46   Ortho/Skin: Arthritis _47 ; Joint pain _48 ; Muscle pain _49 ; Joint swelling _50 ; Back Pain _51 ; Rash _52   Psych: Depression_53 ; Anxiety_54   Heme: Bleeding problems _55 ; Clotting disorders _56 ; Anemia _57   Endocrine: Diabetes _58 ; Thyroid dysfunction_59   Home Medications Prior to Admission medications   Medication Sig Start Date End Date Taking? Authorizing Provider  acetaminophen (TYLENOL) 325 MG tablet Take 650 mg by mouth every 6 (six) hours as needed for mild pain. Take as needed per bottle    Yes [provider]  acetaminophen-codeine (TYLENOL #3) 300-30 MG tablet One every 4 hours as needed for cough 06/23/16  Yes Tanda Rockers, MD  amLODipine (NORVASC) 10 MG tablet Take 1 tablet (10 mg total) by mouth daily. 02/02/17  Yes Tower, Wynelle Fanny, MD  aspirin 81 MG tablet Take 81 mg by mouth daily.   Yes [provider]  chlorpheniramine (  CHLOR-TRIMETON) 4 MG tablet Take 4 mg by mouth every morning.    Yes [provider]  cromolyn (NASALCROM) 5.2 MG/ACT nasal spray Place 2 sprays into both nostrils daily as needed for allergies.   Yes [provider]  cyanocobalamin (,VITAMIN B-12,) 1000 MCG/ML injection Inject 1,000 mcg into the muscle every 3 (three) months.   Yes [provider]  Cyanocobalamin (VITAMIN B12) 3000 MCG SUBL Place 1 capsule under the tongue daily.   Yes [provider]  dextromethorphan-guaiFENesin (MUCINEX DM) 30-600 MG 12hr tablet Take 1 tablet by mouth 2 (two) times daily as needed for cough.   Yes [provider]  EPIPEN 2-PAK 0.3 MG/0.3ML SOAJ injection  INJECT 0.3 MLS (0.3 MG TOTAL) INTO THE MUSCLE ONCE AS NEEDED FOR ALLERGIC REACTION 02/07/15  Yes Tower, Wynelle Fanny, MD  esomeprazole (NEXIUM) 40 MG capsule Take 1 capsule (40 mg total) by mouth daily. 02/02/17  Yes Tower, Wynelle Fanny, MD  ezetimibe (ZETIA) 10 MG tablet TAKE 1 TABLET (10 MG TOTAL) BY MOUTH DAILY. 02/02/17  Yes Tower, Wynelle Fanny, MD  furosemide (LASIX) 20 MG tablet TAKE 1 TABLET BY MOUTH EVERY DAY 03/30/17  Yes Tanda Rockers, MD  furosemide (LASIX) 40 MG tablet Take 1 tablet (40 mg total) by mouth daily. Take 1 daily as needed for extra swelling Patient taking differently: Take 40 mg by mouth daily as needed for fluid. Take 1 daily as needed for extra swelling  04/26/17  Yes Tanda Rockers, MD  montelukast (SINGULAIR) 10 MG tablet Take 1 tablet (10 mg total) by mouth at bedtime. 02/02/17  Yes Tower, Wynelle Fanny, MD  Multiple Vitamin (MULTIVITAMIN) capsule Take 1 capsule by mouth daily.   Yes [provider]  OXYGEN Inhale 3 L into the lungs continuous. 24/7 2 lpm  [employer]   Yes [provider]  potassium chloride SA (KLOR-CON M20) 20 MEQ tablet Take 1 tablet (20 mEq total) by mouth daily as needed. Patient taking differently: Take 20 mEq by mouth daily.  02/02/17  Yes Tower, Marne A, MD  PREDNISONE PO Take 15-20 mg by mouth daily. Take 20 mg one day and 15 mg the next days   Yes [provider]  PROAIR HFA 108 (90 BASE) MCG/ACT inhaler INHALE 2 PUFFS BY MOUTH EVERY 4 HOURS AS NEEDED FOR WHEEZING OR FOR SHORTNESS OF BREATH 02/08/15  Yes Tower, Marne A, MD  ranitidine (ZANTAC) 75 MG tablet Take 300 mg by mouth at bedtime.    Yes [provider]    Past Medical History: Past Medical History:  Diagnosis Date  . Allergy    allergic rhinitis  . Asthma   . Chronic bronchitis ([place])   . Coronary artery disease    cath January 2011 with DEs LAD and RCA  . Dyspnea   . Full dentures   . Gallstones   . GERD (gastroesophageal reflux disease)   . History of echocardiogram     Echo 5/17: mod LVH, EF 50-55%, ant-septal HK, Gr 1 DD, mod LAE //  b.  Echo 7/17: mild concentric LVH, EF 45-50%, inf-lat, inf, inf-septal HK, mild LAE  . History of nuclear stress test    Myoview 7/17: EF 48%, small mild apical defect, no ischemia, low risk  . Hyperlipidemia   . Hypertension   . 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 Myopia   . 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 Nocturnal oxygen desaturation    o2 at night  . Obesity   . Osteoarthritis    knees, fingers, shoulders  . Overactive bladder   . Oxygen deficiency    uses oxygen all day  . Rash    and other non specific skin eruptions  . Retaining fluid    in ankles and feet  . Urinary incontinence   . Vertigo   . Wears glasses     Past Surgical History: Past Surgical History:  Procedure Laterality Date  . CATARACT EXTRACTION W/PHACO Right 12/16/2016   Procedure: CATARACT EXTRACTION PHACO AND INTRAOCULAR LENS PLACEMENT ([place])  right;  Surgeon: Leandrew Koyanagi, MD;  Location: [place];  Service: Ophthalmology;  Laterality: Right;  . CATARACT EXTRACTION W/PHACO Left 02/03/2017   Procedure: CATARACT EXTRACTION PHACO AND INTRAOCULAR LENS PLACEMENT ([place])  Left  Complicated;  Surgeon: Leandrew Koyanagi, MD;  Location: [place];  Service: Ophthalmology;  Laterality: Left;  [place] Uses oxygen   . CHOLECYSTECTOMY  92  . COLONOSCOPY    . CORONARY ANGIOPLASTY WITH STENT PLACEMENT  07/2009   stent  . DILATION AND CURETTAGE OF UTERUS  1984  . INCISIONAL HERNIA REPAIR N/A 05/16/2013   Procedure: HERNIA REPAIR INFRAUMILICAL INCISIONAL;  Surgeon: Joyice Faster. Cornett, MD;  Location: [place];  Service: General;  Laterality: N/A;  umbilical  . INSERTION OF MESH N/A 05/16/2013   Procedure: INSERTION OF MESH;  Surgeon: Joyice Faster. Cornett, MD;  Location: [place];  Service: General;   Laterality: N/A;  umbilical  . JOINT REPLACEMENT  2007   right total knee replacement  . TOTAL KNEE ARTHROPLASTY  2010   left , then vocal cord infection post op  . VIDEO BRONCHOSCOPY Bilateral 07/05/2013   Procedure: VIDEO BRONCHOSCOPY WITH FLUORO;  Surgeon: Tanda Rockers, MD;  Location: [facility];  Service: Cardiopulmonary;  Laterality: Bilateral;  . vocal cord polypectomy      Family History: Family History  Problem Relation Age of Onset  . Stroke Mother   . Diabetes Mother 65  . Stroke Father   . Heart failure Father   . Breast cancer Maternal Grandmother   . Colon cancer Neg Hx     Social History: Social History   Social History  . Marital status: Married    Spouse name: N/A  . Number of children: N/A  . Years of education: N/A   Social History Main Topics  . Smoking status: Former Smoker    Packs/day: 1.00    Years: 25.00    Types: Cigarettes    Quit date: 07/27/1982  . Smokeless tobacco: Never Used  . Alcohol use 0.0 oz/week     Comment: wine-rare  . Drug use: No  . Sexual activity: Not Asked   Other Topics Concern  . None   Social History Narrative  . None    Allergies:  Allergies  Allergen Reactions  . Fish Allergy Anaphylaxis  . Shellfish Allergy Anaphylaxis  . Aspirin     REACTION: nausea and vomiting High doses  . Atorvastatin     REACTION: leg pain  . Crestor [Rosuvastatin Calcium] Other (See Comments)    Muscle pain - allergy/intolerance  . Penicillins     Due to mold allergy per pt  . Simvastatin     REACTION: muscle pain  . Trandolapril     REACTION: leg pain    Objective:    Vital Signs:   Temp:  [97.7 F (36.5  C)-98.8 F (37.1 C)] 98.8 F (37.1 C) (11/01 1121) Pulse Rate:  [59-91] 71 (11/01 1400) Resp:  [13-26] 20 (11/01 1300) BP: (104-134)/(58-108) 130/86 (11/01 1400) SpO2:  [93 %-98 %] 95 % (11/01 1400) FiO2 (%):  [50 %-60 %] 50 % (11/01 1108) Weight:  [262 lb 5.6 oz (119 kg)] 262 lb 5.6 oz (119 kg) (11/01 0500)      Weight change: Filed Weights   05/26/17 0541 05/27/17 0500  Weight: 247 lb (112 kg) 262 lb 5.6 oz (119 kg)    Intake/Output:   Intake/Output Summary (Last 24 hours) at 05/27/17 1431 Last data filed at 05/27/17 1300  Gross per 24 hour  Intake           604.98 ml  Output              350 ml  Net           254.98 ml      Physical Exam    General:  Intubated. Sedated.  HEENT: normal Neck: supple. JVP to assess due to body habitus  . Carotids 2+ bilat; no bruits. No lymphadenopathy or thyromegaly appreciated. Cor: PMI nondisplaced. Regular rate & rhythm. No rubs, gallops or murmurs. Lungs: Decreased Abdomen: obese, soft, nontender, nondistended. No hepatosplenomegaly. No bruits or masses. Good bowel sounds. Extremities: no cyanosis, clubbing, rash, R and LLE SCDs 1-2+ edema.  Neuro: Intubated. Sedated.   Telemetry   NSR 70s  EKG    Sinus Tach 108 bpm  Labs   Basic Metabolic Panel:  Recent Labs Lab 05/26/17 0610 05/26/17 1124 05/27/17 0249  NA 144  --  143  K 4.2  --  3.5  CL 98*  --  97*  CO2 34*  --  35*  GLUCOSE 172*  --  146*  BUN 17  --  21*  CREATININE 0.90 1.01* 0.90  CALCIUM 9.2  --  8.9    Liver Function Tests: No results for input(s): AST, ALT, ALKPHOS, BILITOT, PROT, ALBUMIN in the last 168 hours. No results for input(s): LIPASE, AMYLASE in the last 168 hours. No results for input(s): AMMONIA in the last 168 hours.  CBC:  Recent Labs Lab 05/26/17 0610 05/26/17 1124 05/27/17 0249  WBC 21.4* 15.9* 18.5*  NEUTROABS 16.7*  --   --   HGB 13.8 11.7* 11.3*  HCT 47.8* 41.4 38.1  MCV 103.7* 103.8* 100.8*  PLT 258 149* 164    Cardiac Enzymes:  Recent Labs Lab 05/26/17 0610 05/26/17 1124 05/26/17 1848 05/27/17 0038  TROPONINI 0.04* 0.05* 0.04* 0.04*    BNP: BNP (last 3 results)  Recent Labs  05/26/17 0610  BNP 760.9*    ProBNP (last 3 results)  Recent Labs  01/11/17 1053  PROBNP 264.0*     CBG:  Recent Labs Lab  05/26/17 2121 05/27/17 0100 05/27/17 0534 05/27/17 0842 05/27/17 1120  GLUCAP 139* 147* 148* 124* 109*    Coagulation Studies: No results for input(s): LABPROT, INR in the last 72 hours.   Imaging   Dg Chest Port 1 View  Result Date: 05/27/2017 CLINICAL DATA:  Respiratory failure.  Shortness of breath. EXAM: PORTABLE CHEST 1 VIEW COMPARISON:  05/26/2017 FINDINGS: The endotracheal tube is in good position, 3.9 cm above the carina. The NG tube is coursing down the esophagus and into the stomach. Persistent cardiac enlargement, pulmonary edema and pleural effusions. IMPRESSION: Stable support apparatus. Persistent cardiac enlargement, pulmonary edema and pleural effusions. Electronically Signed   By: Ricky Stabs.D.  On: 05/27/2017 07:28      Medications:     Current Medications: . amLODipine  10 mg Per Tube Daily  . aspirin  81 mg Per Tube Daily  . chlorhexidine gluconate (MEDLINE KIT)  15 mL Mouth Rinse BID  . ezetimibe  10 mg Per Tube QHS  . heparin  5,000 Units Subcutaneous Q8H  . insulin aspart  0-20 Units Subcutaneous Q4H  . mouth rinse  15 mL Mouth Rinse QID  . methylPREDNISolone (SOLU-MEDROL) injection  20 mg Intravenous Q12H  . metolazone  5 mg Oral Daily  . montelukast  10 mg Per Tube QHS  . pantoprazole sodium  40 mg Per Tube Daily  . potassium chloride  40 mEq Per Tube Q6H     Infusions: . sodium chloride    . furosemide (LASIX) infusion    . potassium chloride Stopped (05/27/17 1514)  . 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  Admitted with acute/chronic respiratory failure  Assessment/Plan  1. Acute/Chronic Hypercarbic Respiratory Failure  Failed bipap. Intubated 93/57.  2. A/C Systolic/Diastolic Heart Failure:  ECHO 05/26/2017 EF down from ~45-50% --> 35-40%    Volume overloaded on xray. Difficult to assess JVP due to body habitus.  Started on lasix 10 mg per hour + metolazone 5 mg daily. Will follow response.  Add 12.5 mg spiro daily.  3. HTN- adding spiro  4. ID: WBC 18. ? On going steroids.  5. Morbid Obesity : Body mass index is 45.03 kg/m.    Length of Stay: 1  Amy Clegg, NP  05/27/2017, 2:31 PM  Advanced Heart Failure Team Pager [phone] (M-F; 7a - 4p)  Please contact [facility] for night-coverage after hours (4p -7a ) and weekends on [email or web address]  Patient seen with NP, agree with the above note.  She is intubated/sedated.  On exam, JVP is difficult but appears mildly elevated.  She has peripheral edema.  Heart regular, no murmur.   ECG with NSR, diffuse nonspecific T wave flattening.   1. Acute/chronic systolic CHF: Probably ischemic cardiomyopathy given prior history.  Echo this admission with EF down to 35-40% range, around 50% in past.  She was admitted with worsening dyspnea, elevated BNP, CXR with pulmonary edema.  PCT not elevated.  Has history of BOOP.  She does have volume overload on exam.  Suspect CHF is playing the major role in her exacerbation.  - Agree with Lasix gtt 10 mg/hr, she will get a dose of metolazone today. Follow BMET closely.  - Add spironolactone 12.5 mg daily.  - She has not tolerated beta blockers in the past due to wheezing, would consider starting low dose of beta-1 specific bisoprolol when she is well-diuresed.  - Eventually would start ARB, would be reasonable to replace amlodipine with ARB.  - With fall in EF, would consider left/right heart cath once she is diuresed and stabilized.  2. Acute hypoxemic respiratory failure: As above, think more likely due to pulmonary edema than infection or BOOP flare.  PCT was not elevated.  She is getting IV Solumedrol given BOOP.  At baseline, she is on 2L home oxygen.  3. CAD: h/o DES to LAD and RCA in 2011.  No chest pain but admitted with severe dyspnea/volume  overload and fall in EF on echo.  Troponin mildly elevated but no trend. Suspect most likely demand ischemia from volume overload.  - Given fall in EF, would consider cath once she is clinically stabilized.   Loralie Champagne 05/27/2017 3:42 PM

## 2017-05-28 ENCOUNTER — Inpatient Hospital Stay (HOSPITAL_COMMUNITY): Payer: Medicare Other

## 2017-05-28 DIAGNOSIS — I5023 Acute on chronic systolic (congestive) heart failure: Secondary | ICD-10-CM

## 2017-05-28 DIAGNOSIS — J810 Acute pulmonary edema: Secondary | ICD-10-CM

## 2017-05-28 DIAGNOSIS — J441 Chronic obstructive pulmonary disease with (acute) exacerbation: Secondary | ICD-10-CM

## 2017-05-28 DIAGNOSIS — J9620 Acute and chronic respiratory failure, unspecified whether with hypoxia or hypercapnia: Secondary | ICD-10-CM

## 2017-05-28 DIAGNOSIS — J9602 Acute respiratory failure with hypercapnia: Secondary | ICD-10-CM

## 2017-05-28 LAB — BLOOD GAS, ARTERIAL
ACID-BASE EXCESS: 17.1 mmol/L — AB (ref 0.0–2.0)
BICARBONATE: 41.4 mmol/L — AB (ref 20.0–28.0)
FIO2: 80
LHR: 20 {breaths}/min
O2 SAT: 92.3 %
PCO2 ART: 48.9 mmHg — AB (ref 32.0–48.0)
PEEP/CPAP: 8 cmH2O
PH ART: 7.539 — AB (ref 7.350–7.450)
Patient temperature: 99.5
VT: 460 mL
pO2, Arterial: 64.6 mmHg — ABNORMAL LOW (ref 83.0–108.0)

## 2017-05-28 LAB — BASIC METABOLIC PANEL
Anion gap: 13 (ref 5–15)
Anion gap: 16 — ABNORMAL HIGH (ref 5–15)
BUN: 29 mg/dL — ABNORMAL HIGH (ref 6–20)
BUN: 42 mg/dL — AB (ref 6–20)
CHLORIDE: 90 mmol/L — AB (ref 101–111)
CHLORIDE: 85 mmol/L — AB (ref 101–111)
CO2: 38 mmol/L — AB (ref 22–32)
CO2: 40 mmol/L — AB (ref 22–32)
CREATININE: 1.14 mg/dL — AB (ref 0.44–1.00)
Calcium: 9.6 mg/dL (ref 8.9–10.3)
Calcium: 9 mg/dL (ref 8.9–10.3)
Creatinine, Ser: 1.49 mg/dL — ABNORMAL HIGH (ref 0.44–1.00)
GFR calc Af Amer: 38 mL/min — ABNORMAL LOW (ref >60)
GFR calc non Af Amer: 45 mL/min — ABNORMAL LOW (ref >60)
GFR calc non Af Amer: 33 mL/min — ABNORMAL LOW (ref >60)
GFR, EST AFRICAN AMERICAN: 52 mL/min — AB (ref >60)
GLUCOSE: 245 mg/dL — AB (ref 65–99)
Glucose, Bld: 183 mg/dL — ABNORMAL HIGH (ref 65–99)
POTASSIUM: 4.5 mmol/L (ref 3.5–5.1)
POTASSIUM: 3 mmol/L — AB (ref 3.5–5.1)
SODIUM: 141 mmol/L (ref 135–145)
Sodium: 141 mmol/L (ref 135–145)

## 2017-05-28 LAB — CBC
HEMATOCRIT: 44 % (ref 36.0–46.0)
HEMOGLOBIN: 13.3 g/dL (ref 12.0–15.0)
MCH: 29.6 pg (ref 26.0–34.0)
MCHC: 30.2 g/dL (ref 30.0–36.0)
MCV: 98 fL (ref 78.0–100.0)
Platelets: 185 10*3/uL (ref 150–400)
RBC: 4.49 MIL/uL (ref 3.87–5.11)
RDW: 16.5 % — ABNORMAL HIGH (ref 11.5–15.5)
WBC: 22.5 10*3/uL — ABNORMAL HIGH (ref 4.0–10.5)

## 2017-05-28 LAB — MAGNESIUM
MAGNESIUM: 2.3 mg/dL (ref 1.7–2.4)
MAGNESIUM: 2.1 mg/dL (ref 1.7–2.4)

## 2017-05-28 LAB — GLUCOSE, CAPILLARY
GLUCOSE-CAPILLARY: 166 mg/dL — AB (ref 65–99)
GLUCOSE-CAPILLARY: 148 mg/dL — AB (ref 65–99)
GLUCOSE-CAPILLARY: 239 mg/dL — AB (ref 65–99)
Glucose-Capillary: 130 mg/dL — ABNORMAL HIGH (ref 65–99)
Glucose-Capillary: 176 mg/dL — ABNORMAL HIGH (ref 65–99)
Glucose-Capillary: 186 mg/dL — ABNORMAL HIGH (ref 65–99)

## 2017-05-28 LAB — PHOSPHORUS: PHOSPHORUS: 4.2 mg/dL (ref 2.5–4.6)

## 2017-05-28 MED ORDER — FUROSEMIDE 10 MG/ML IJ SOLN
10.0000 mg/h | INTRAVENOUS | Status: DC
  Administered 2017-05-28: 10 mg/h via INTRAVENOUS
  Filled 2017-05-28: qty 21

## 2017-05-28 MED ORDER — POTASSIUM CHLORIDE 20 MEQ/15ML (10%) PO SOLN: 40.0000 meq | Freq: Three times a day (TID) | ORAL | Status: DC

## 2017-05-28 MED ORDER — LOSARTAN POTASSIUM 25 MG PO TABS
12.5000 mg | ORAL_TABLET | Freq: Every day | ORAL | Status: DC
  Administered 2017-05-28: 12.5 mg via ORAL
  Filled 2017-05-28: qty 0.5

## 2017-05-28 MED ORDER — POTASSIUM CHLORIDE 20 MEQ/15ML (10%) PO SOLN
60.0000 meq | Freq: Once | ORAL | Status: AC
  Administered 2017-05-28: 60 meq
  Filled 2017-05-28: qty 45

## 2017-05-28 MED ORDER — VITAL HIGH PROTEIN PO LIQD
1000.0000 mL | ORAL | Status: DC
  Administered 2017-05-28 – 2017-05-29 (×2): 1000 mL

## 2017-05-28 MED ORDER — PRO-STAT SUGAR FREE PO LIQD
30.0000 mL | Freq: Four times a day (QID) | ORAL | Status: DC
  Administered 2017-05-28 – 2017-05-30 (×8): 30 mL
  Filled 2017-05-28 (×13): qty 30

## 2017-05-28 MED ORDER — CHLORHEXIDINE GLUCONATE 0.12 % MT SOLN
OROMUCOSAL | Status: AC
  Filled 2017-05-28: qty 15

## 2017-05-28 MED ORDER — ACETAZOLAMIDE SODIUM 500 MG IJ SOLR
250.0000 mg | Freq: Four times a day (QID) | INTRAMUSCULAR | Status: AC
  Administered 2017-05-28 – 2017-05-29 (×3): 250 mg via INTRAVENOUS
  Filled 2017-05-28 (×4): qty 250

## 2017-05-28 MED ORDER — FLUCONAZOLE 100MG IVPB
100.0000 mg | Freq: Once | INTRAVENOUS | Status: AC
  Administered 2017-05-28: 100 mg via INTRAVENOUS
  Filled 2017-05-28: qty 50

## 2017-05-28 NOTE — Progress Notes (Signed)
Initial Nutrition Assessment  DOCUMENTATION CODES:   Morbid obesity  INTERVENTION:    Vital High Protein at 35 ml/h (840 ml per day)  Pro-stat 30 ml QID  Provides 1240 kcal (1720 kcal total with Propofol), 134 gm protein, 702 ml free water daily  NUTRITION DIAGNOSIS:   Inadequate oral intake related to inability to eat as evidenced by NPO status.  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  Spoke with Dr. Lindell Noe, CCM resident; okay for RD to order TF today. Patient will likely not be extubated today. Patient is currently intubated on ventilator support Temp (24hrs), Avg:99.3 F (37.4 C), Min:98.2 F (36.8 C), Max:99.9 F (37.7 C)  Propofol: 18.5 ml/hr providing 488 kcal from lipid.  Labs reviewed. CBG's: [phone] Medications reviewed and include Spironolactone, Lasix, Propofol  NUTRITION - FOCUSED PHYSICAL EXAM:    Most Recent Value  Orbital Region  No depletion  Upper Arm Region  No depletion  Thoracic and Lumbar Region  Unable to assess  Buccal Region  Unable to assess  Temple Region  No depletion  Clavicle Bone Region  No depletion  Clavicle and Acromion Bone Region  No depletion  Scapular Bone Region  No depletion  Dorsal Hand  No depletion  Patellar Region  No depletion  Anterior Thigh Region  No depletion  Posterior Calf Region  No depletion  Edema (RD Assessment)  Moderate  Hair  Reviewed  Eyes  Unable to assess  Mouth  Unable to assess  Skin  Reviewed  Nails  Reviewed       Diet Order:  Diet NPO time specified  EDUCATION NEEDS:   No education needs have been identified at this  time  Skin:  Skin Assessment: Reviewed RN Assessment  Last BM:  unknown  Height:   Ht Readings from Last 1 Encounters:  05/26/17 5\' 4"  (1.626 m)    Weight:   Wt Readings from Last 1 Encounters:  05/28/17 255 lb 15.3 oz (116.1 kg)    Ideal Body Weight:  54.5 kg  BMI:  Body mass index is 43.93 kg/m.  Estimated Nutritional Needs:   Kcal:  8811-0315  Protein:  120-136 gm  Fluid:  1.6-1.8 L    Molli Barrows, RD, LDN, CNSC Pager [phone] After Hours Pager [phone]

## 2017-05-28 NOTE — Progress Notes (Signed)
[place] Progress Note Patient Name: Kelli Velasquez DOB: 08/26/1940 MRN: [id number]   Date of Service  05/28/2017  HPI/Events of Note  Contacted by bedside nurse. Patient having some underlying ectopy. Currently on Lasix drip. Renal function worsening with serum creatinine now up to 1.4. Potassium 3.0.   eICU Interventions  1. Discontinuing Lasix drip 2. Stat magnesium 3. KCl 60 mEq via tube 4. Continuing telemetry monitoring.      Intervention Category Major Interventions: Electrolyte abnormality - evaluation and management;Arrhythmia - evaluation and management  Tera Partridge 05/28/2017, 8:40 PM

## 2017-05-28 NOTE — Progress Notes (Signed)
Patient ID: Kelli Velasquez, female   DOB: September 20, 1940, 76 y.o.   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 She has not been on bb due to wheezing and bronchitis.  Over the last 2 weeks she has had increased dyspnea with exertion, she was short of breath at rest on the day of admission.  Her husband says she has been taking 20 mg of lasix daily and occasionally an extra 20 mg of lasix.   Presented to [facility] ED with respiratory distress. Family reported increased dyspnea over the last week requiring 2 liters oxygen at home. In ED she received solumedrol and duonebs. Placed on Bipap but continue decline requiring intubation. CXR concerning for interstitial edema and bilateral effusions.  Yesterday, started on Lasix gtt at 10 mg/hr and got a dose of metolazone.  She diuresed well and weight is down.  Remains intubated/sedated.   Studies:  Echo 05/26/2017 EF 35-40%  Grade I DD RV normal Echo 2017 EF 45-50% with WMA Myoview 2017 No significant ischemia EF 48%.  [place] 2011- DES LAD and RCA.   Objective:   Weight Range: 255 lb 15.3 oz (116.1 kg) Body mass index is 43.93 kg/m.   Vital Signs:   Temp:  [98.8 F (37.1 C)-99.9 F (37.7 C)] 99.5 F (37.5 C) (11/02 0711) Pulse Rate:  [59-91] 84 (11/02 0700) Resp:  [13-26] 18 (11/02 0700) BP: (104-146)/(51-108) 146/68 (11/02 0700) SpO2:  [88 %-97 %] 97 % (11/02 0700) FiO2 (%):  [50 %-80 %] 80 % (11/02 0600) Weight:  [255 lb 15.3 oz (116.1 kg)] 255 lb 15.3 oz (116.1 kg) (11/02 0429)    Weight change: Filed Weights   05/26/17 0541 05/27/17 0500 05/28/17 0429  Weight: 247 lb (112 kg) 262 lb 5.6 oz (119 kg) 255 lb 15.3 oz (116.1 kg)    Intake/Output:   Intake/Output Summary (Last 24 hours) at 05/28/17 0718 Last data filed at 05/28/17 0700  Gross per 24 hour  Intake           1295.02 ml  Output             5800 ml  Net         -4504.98 ml      Physical Exam    General:  Intubated/sedated.  HEENT: ET tube Neck: Thick. JVP difficult. No lymphadenopathy or thyromegaly appreciated. Cor: PMI nondisplaced. Regular rate & rhythm. No rubs, gallops or murmurs. Lungs: Dependent crackles Abdomen: Soft, nondistended. No hepatosplenomegaly. No bruits or masses. Good bowel sounds. Extremities: No cyanosis, clubbing, rash.  1+ ankle edema.  Neuro: Sedated on vent   Telemetry   NSR (personally reviewed)   Labs    CBC  Recent Labs  05/26/17 0610  05/27/17 0249 05/28/17 0243  WBC 21.4*  < > 18.5* 22.5*  NEUTROABS 16.7*  --   --   --   HGB 13.8  < > 11.3* 13.3  HCT 47.8*  < > 38.1 44.0  MCV 103.7*  < > 100.8* 98.0  PLT 258  < > 164 185  < > = values in this interval not displayed. Basic Metabolic Panel  Recent Labs  05/27/17 0249 05/28/17 0243  NA 143 141  K 3.5 4.5  CL 97* 90*  CO2 35* 38*  GLUCOSE 146*  183*  BUN 21* 29*  CREATININE 0.90 1.14*  CALCIUM 8.9 9.6  MG  --  2.3  PHOS  --  4.2   Liver Function Tests No results for input(s): AST, ALT, ALKPHOS, BILITOT, PROT, ALBUMIN in the last 72 hours. No results for input(s): LIPASE, AMYLASE in the last 72 hours. Cardiac Enzymes  Recent Labs  05/26/17 1124 05/26/17 1848 05/27/17 0038  TROPONINI 0.05* 0.04* 0.04*    BNP: BNP (last 3 results)  Recent Labs  05/26/17 0610  BNP 760.9*    ProBNP (last 3 results)  Recent Labs  01/11/17 1053  PROBNP 264.0*     D-Dimer No results for input(s): DDIMER in the last 72 hours. Hemoglobin A1C No results for input(s): HGBA1C in the last 72 hours. Fasting Lipid Panel  Recent Labs  05/26/17 1124  TRIG 90   Thyroid Function Tests No results for input(s): TSH, T4TOTAL, T3FREE, THYROIDAB in the last 72 hours.  Invalid input(s): FREET3  Other results:   Imaging     No results found.   Medications:     Scheduled  Medications: . amLODipine  10 mg Per Tube Daily  . aspirin  81 mg Per Tube Daily  . chlorhexidine gluconate (MEDLINE KIT)  15 mL Mouth Rinse BID  . ezetimibe  10 mg Per Tube QHS  . heparin  5,000 Units Subcutaneous Q8H  . insulin aspart  0-20 Units Subcutaneous Q4H  . losartan  12.5 mg Oral Daily  . mouth rinse  15 mL Mouth Rinse QID  . methylPREDNISolone (SOLU-MEDROL) injection  20 mg Intravenous Q12H  . montelukast  10 mg Per Tube QHS  . pantoprazole sodium  40 mg Per Tube Daily  . spironolactone  12.5 mg Oral Daily     Infusions: . sodium chloride    . furosemide (LASIX) infusion 10 mg/hr (05/27/17 1511)  . 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  Admitted with acute/chronic respiratory failure  Assessment/Plan   1. Acute/chronic systolic CHF: Probably ischemic cardiomyopathy given prior history.  Echo this admission with EF down to 35-40% range, around 50% in past.  She was admitted with worsening dyspnea, elevated BNP, CXR with pulmonary edema. PCT not elevated.  Has history of BOOP.  She does have volume overload on exam.  Suspect CHF is playing the major role in her exacerbation.  She diuresed very vigorously yesterday with Lasix gtt at 10 mg/hr + metolazone 5. Difficult to assess volume on exam but suspect still volume overloaded. Creatinine fairly stable. - Would continue Lasix gtt at 10 mg/hr today, don't think she needs metolazone dose today.  - Continue spironolactone 12.5 daily and add losartan 12.5 mg daily.   - She has not tolerated beta blockers in the past due to wheezing, would consider starting low dose of beta-1 specific bisoprolol when she is well-diuresed.  - With fall in EF, would plan left/right  heart cath once she is diuresed and stabilized, likely next week.  2. Acute hypoxemic respiratory failure: As above, think more likely due to pulmonary edema than infection or BOOP flare.  PCT was not elevated.  She is getting IV Solumedrol given BOOP.  At baseline, she is on 2L home oxygen.  3. CAD:  h/o DES to LAD and RCA in 2011.  No chest pain but admitted with severe dyspnea/volume overload and fall in EF on echo.  Troponin mildly elevated but no trend. Suspect most likely demand ischemia from volume overload.  - Given fall in EF, would plan cath once she is extubated/diuresed.   Length of Stay: 2   Loralie Champagne, MD  05/28/2017, 7:18 AM  Advanced Heart Failure Team Pager [phone] (M-F; 7a - 4p)  Please contact [facility] for night-coverage after hours (4p -7a ) and weekends on [email or web address]

## 2017-05-28 NOTE — Progress Notes (Signed)
Oral candida noted : discussed with [facility]

## 2017-05-28 NOTE — Progress Notes (Signed)
Hypokalemia : K: 3.0: Frequent ectopic beats : discussed with Kelli Velasquez

## 2017-05-28 NOTE — Progress Notes (Signed)
PULMONARY / CRITICAL CARE MEDICINE   Name: Kelli Velasquez MRN: [id number] DOB: 05/11/1941    ADMISSION DATE:  05/26/2017  REFERRING MD:  EDP  CHIEF COMPLAINT:  Respiratory failure   HISTORY OF PRESENT ILLNESS:   75yo female former smoker (quit 1984) with hx CAD, HTN, CHF, BOOP followed by Dr. Melvyn Novas on chronic prednisone presented 10/31 with progressive SOB, cough, wheezing, BLE edema.  She had significant respiratory distress in ER and failed trial bipap requiring intubation.  Initial w/u revealed BNP >700, WBC 21.4 (chronic steroids), hypercarbic respiratory failure with pH 7.211, PCO2 99.4 prior to intubation.  PCCM called for ICU admission.   Per husband she has been more SOB over last 1-2 weeks.  Worsening orthopnea.    SUBJECTIVE:   Patient sedated. Family not at bedside.   VITAL SIGNS: BP (!) 146/68   Pulse 84   Temp 99.5 F (37.5 C) (Oral)   Resp 18   Ht 5\' 4"  (1.626 m)   Wt 255 lb 15.3 oz (116.1 kg)   LMP 07/27/1980   SpO2 97%   BMI 43.93 kg/m   HEMODYNAMICS:    VENTILATOR SETTINGS: Vent Mode: PRVC FiO2 (%):  [50 %-80 %] 80 % Set Rate:  [20 bmp] 20 bmp Vt Set:  [460 mL] 460 mL PEEP:  [8 cmH20] 8 cmH20 Plateau Pressure:  [20 NID78-24 cmH20] 22 cmH20  INTAKE / OUTPUT: I/O last 3 completed shifts: In: 1617.8 [I.V.:1217.8; IV Piggyback:400] Out: 6050 [Urine:6050]  PHYSICAL EXAMINATION:  General:  Obese female, NAD on vent  Neuro:  Sedated, moves extremities to touch, RASS -2 HEENT:  Mm moist, difficult to assess JVD Cardiovascular:  s1s2 rrr Lungs:  resps even non labored on vent, diminished bases Abdomen:  Round, soft, obese  Musculoskeletal:  Warm and dry, scant BLE  LABS:  BMET  Recent Labs Lab 05/26/17 0610 05/26/17 1124 05/27/17 0249 05/28/17 0243  NA 144  --  143 141  K 4.2  --  3.5 4.5  CL 98*  --  97* 90*  CO2 34*  --  35* 38*  BUN 17  --  21* 29*  CREATININE 0.90 1.01* 0.90 1.14*  GLUCOSE 172*  --  146* 183*     Electrolytes  Recent Labs Lab 05/26/17 0610 05/27/17 0249 05/28/17 0243  CALCIUM 9.2 8.9 9.6  MG  --   --  2.3  PHOS  --   --  4.2    CBC  Recent Labs Lab 05/26/17 1124 05/27/17 0249 05/28/17 0243  WBC 15.9* 18.5* 22.5*  HGB 11.7* 11.3* 13.3  HCT 41.4 38.1 44.0  PLT 149* 164 185    Coag's No results for input(s): APTT, INR in the last 168 hours.  Sepsis Markers  Recent Labs Lab 05/26/17 1124  PROCALCITON <0.10    ABG  Recent Labs Lab 05/26/17 0831 05/26/17 1032 05/28/17 0443  PHART 7.215* 7.326* 7.539*  PCO2ART 102* 79.5* 48.9*  PO2ART 77.9* 81.0* 64.6*    Liver Enzymes No results for input(s): AST, ALT, ALKPHOS, BILITOT, ALBUMIN in the last 168 hours.  Cardiac Enzymes  Recent Labs Lab 05/26/17 1124 05/26/17 1848 05/27/17 0038  TROPONINI 0.05* 0.04* 0.04*    Glucose  Recent Labs Lab 05/27/17 1120 05/27/17 1608 05/27/17 2041 05/28/17 0020 05/28/17 0413 05/28/17 0709  GLUCAP 109* 191* 173* 130* 186* 166*    Imaging No results found.  STUDIES:  Echo 10/31>>> EF 35-40%, diffuse hypokinesis  CULTURES: BC x 2 10/31>>>  ANTIBIOTICS: None to date.  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   ASSESSMENT / PLAN:  PULMONARY Acute on chronic hypercarbic respiratory failure  Pulmonary edema  Hx BOOP - on chronic steroids  P:   Vent support - 8cc/kg   Diuresis as below  Continue home steroids  CXR improved  CARDIOVASCULAR Acute on chronic CHF  Hx HTN  P: Trop flat. Echo with EF 35-40% and diffuse hypokinesis.  Diuresis as BP and Scr tol (s/p IV lasix 40mg  x 2), continue IV lasix HF team consulted, appreciate recs: consider cath once stabilized, add 12.5mg  spiro and 12.m5 losartan daily. Continue lasix, d/c metolazone.  Out 5.8L 11/1.  Hold home norvasc for now  BP control - spiro  and losartan as above  RENAL No active issue  P:   Monitor Scr with diuresis  F/u chem   GASTROINTESTINAL GERD  P:   PPI  NPO for now  Add TF  HEMATOLOGIC leukocytosis - likely r/t chronic steroids  P:  F/u cbc  Sq heparin   INFECTIOUS Leukocytosis - likely r/t steroids. Afebrile, no obvious infiltrate.  P:   PCT negative. WBC increased, but likely hemoconcentration in the setting of diuresis.  No abx  ENDOCRINE Hyperglycemia   P:   SSI   NEUROLOGIC AMS - r/t hypercarbia  Sedation needs on vent  P:   RASS goal: -1 Propofol, PRN fent   FAMILY  - Updates: husband updated at length at bedside 11/1 - Inter-disciplinary family meet or Palliative Care meeting due by:  Day 7   Ralene Ok, MD PGY2, Family Medicine  Attending Note:  75 year old year old female with PMH of CHF and VDRF due to pulmonary edema.  Patient diuresed well but now has contraction alkalosis.  On exam, diffuse crackles.  I reviewed CXR myself, pulmonary edema noted with ETT ok.  Will hold zaroxolyn.  Continue lasix drip. Diamox added for contraction alkalosis.  Continue vent support.  D/C losartan given renal function, will restart in AM.  Family updated bedside.  Full code status for now.  The patient is critically ill with multiple organ systems failure and requires high complexity decision making for assessment and support, frequent evaluation and titration of therapies, application of advanced monitoring technologies and extensive interpretation of multiple databases.   Critical Care Time devoted to patient care services described in this note is  35  Minutes. This time reflects time of care of this signee Dr Jennet Maduro. This critical care time does not reflect procedure time, or teaching time or supervisory time of PA/NP/Med student/Med Resident etc but could involve care discussion time.  Rush Farmer, M.D. [facility] Pulmonary/Critical Care Medicine. Pager: [phone]. After hours  pager: [phone].

## 2017-05-28 NOTE — Progress Notes (Signed)
[place] Progress Note Patient Name: Kelli Velasquez DOB: Mar 11, 1941 MRN: [id number]   Date of Service  05/28/2017  HPI/Events of Note  Bedside nurse reported oral thrush. Currently endotracheally intubated.   eICU Interventions  1. Diflucan 100 mg IV 1 ordered 2. Defer to rounding physician on continuing treatment for oral thrush      Intervention Category Intermediate Interventions: Other:  Tera Partridge 05/28/2017, 7:33 PM

## 2017-05-28 NOTE — Care Management Note (Signed)
Case Management Note  Patient Details  Name: Kelli Velasquez MRN: [id number] Date of Birth: 07/25/1941  Subjective/Objective:    Pt admitted with resp failure                Action/Plan:  PTA from home with husband.  Pt is now ventilated - CM will continue to follow for discharge needs   Expected Discharge Date:                  Expected Discharge Plan:     In-House Referral:     Discharge planning Services  CM Consult  Post Acute Care Choice:    Choice offered to:     DME Arranged:    DME Agency:     HH Arranged:    HH Agency:     Status of Service:     If discussed at [employer] of Stay Meetings, dates discussed:    Additional Comments:  Maryclare Labrador, RN 05/28/2017, 4:33 PM

## 2017-05-29 ENCOUNTER — Inpatient Hospital Stay (HOSPITAL_COMMUNITY): Payer: Medicare Other

## 2017-05-29 LAB — CBC
HCT: 42.1 % (ref 36.0–46.0)
HEMOGLOBIN: 13.1 g/dL (ref 12.0–15.0)
MCH: 29.8 pg (ref 26.0–34.0)
MCHC: 31.1 g/dL (ref 30.0–36.0)
MCV: 95.9 fL (ref 78.0–100.0)
Platelets: 151 10*3/uL (ref 150–400)
RBC: 4.39 MIL/uL (ref 3.87–5.11)
RDW: 15.8 % — ABNORMAL HIGH (ref 11.5–15.5)
WBC: 25.2 10*3/uL — AB (ref 4.0–10.5)

## 2017-05-29 LAB — BASIC METABOLIC PANEL
ANION GAP: 15 (ref 5–15)
ANION GAP: 14 (ref 5–15)
BUN: 53 mg/dL — ABNORMAL HIGH (ref 6–20)
BUN: 62 mg/dL — ABNORMAL HIGH (ref 6–20)
CALCIUM: 9 mg/dL (ref 8.9–10.3)
CHLORIDE: 84 mmol/L — AB (ref 101–111)
CHLORIDE: 92 mmol/L — AB (ref 101–111)
CO2: 42 mmol/L — ABNORMAL HIGH (ref 22–32)
CO2: 36 mmol/L — ABNORMAL HIGH (ref 22–32)
CREATININE: 1.58 mg/dL — AB (ref 0.44–1.00)
Calcium: 8.9 mg/dL (ref 8.9–10.3)
Creatinine, Ser: 1.5 mg/dL — ABNORMAL HIGH (ref 0.44–1.00)
GFR calc Af Amer: 38 mL/min — ABNORMAL LOW (ref >60)
GFR calc non Af Amer: 30 mL/min — ABNORMAL LOW (ref >60)
GFR, EST AFRICAN AMERICAN: 35 mL/min — AB (ref >60)
GFR, EST NON AFRICAN AMERICAN: 32 mL/min — AB (ref >60)
GLUCOSE: 209 mg/dL — AB (ref 65–99)
Glucose, Bld: 232 mg/dL — ABNORMAL HIGH (ref 65–99)
POTASSIUM: 3.4 mmol/L — AB (ref 3.5–5.1)
Potassium: 2.8 mmol/L — ABNORMAL LOW (ref 3.5–5.1)
SODIUM: 141 mmol/L (ref 135–145)
Sodium: 142 mmol/L (ref 135–145)

## 2017-05-29 LAB — GLUCOSE, CAPILLARY
GLUCOSE-CAPILLARY: 246 mg/dL — AB (ref 65–99)
GLUCOSE-CAPILLARY: 231 mg/dL — AB (ref 65–99)
Glucose-Capillary: 172 mg/dL — ABNORMAL HIGH (ref 65–99)
Glucose-Capillary: 203 mg/dL — ABNORMAL HIGH (ref 65–99)
Glucose-Capillary: 236 mg/dL — ABNORMAL HIGH (ref 65–99)
Glucose-Capillary: 255 mg/dL — ABNORMAL HIGH (ref 65–99)

## 2017-05-29 LAB — BLOOD GAS, ARTERIAL
ACID-BASE EXCESS: 19.8 mmol/L — AB (ref 0.0–2.0)
Bicarbonate: 44.9 mmol/L — ABNORMAL HIGH (ref 20.0–28.0)
DRAWN BY: 414221
FIO2: 70
MECHVT: 460 mL
O2 Saturation: 96.2 %
PEEP: 10 cmH2O
Patient temperature: 100.1
RATE: 20 resp/min
pCO2 arterial: 59.3 mmHg — ABNORMAL HIGH (ref 32.0–48.0)
pH, Arterial: 7.495 — ABNORMAL HIGH (ref 7.350–7.450)
pO2, Arterial: 93.6 mmHg (ref 83.0–108.0)

## 2017-05-29 LAB — TRIGLYCERIDES: Triglycerides: 243 mg/dL — ABNORMAL HIGH (ref <150)

## 2017-05-29 LAB — MAGNESIUM: MAGNESIUM: 2.5 mg/dL — AB (ref 1.7–2.4)

## 2017-05-29 LAB — PHOSPHORUS: PHOSPHORUS: 6.6 mg/dL — AB (ref 2.5–4.6)

## 2017-05-29 MED ORDER — POTASSIUM CHLORIDE 20 MEQ/15ML (10%) PO SOLN
20.0000 meq | Freq: Once | ORAL | Status: AC
  Administered 2017-05-29: 20 meq
  Filled 2017-05-29: qty 15

## 2017-05-29 MED ORDER — FUROSEMIDE 10 MG/ML IJ SOLN
40.0000 mg | Freq: Three times a day (TID) | INTRAMUSCULAR | Status: AC
  Administered 2017-05-29 (×2): 40 mg via INTRAVENOUS
  Filled 2017-05-29 (×2): qty 4

## 2017-05-29 MED ORDER — POTASSIUM CHLORIDE 20 MEQ/15ML (10%) PO SOLN
40.0000 meq | Freq: Once | ORAL | Status: AC
  Administered 2017-05-29: 40 meq via ORAL
  Filled 2017-05-29: qty 30

## 2017-05-29 MED ORDER — FENTANYL 2500MCG IN NS 250ML (10MCG/ML) PREMIX INFUSION
25.0000 ug/h | INTRAVENOUS | Status: DC
  Administered 2017-05-30: 35 ug/h via INTRAVENOUS
  Filled 2017-05-29: qty 250

## 2017-05-29 MED ORDER — FENTANYL BOLUS VIA INFUSION
25.0000 ug | INTRAVENOUS | Status: DC | PRN
  Filled 2017-05-29: qty 25

## 2017-05-29 MED ORDER — POTASSIUM CHLORIDE 20 MEQ/15ML (10%) PO SOLN
ORAL | Status: AC
  Filled 2017-05-29: qty 30

## 2017-05-29 MED ORDER — ACETAZOLAMIDE SODIUM 500 MG IJ SOLR
250.0000 mg | Freq: Two times a day (BID) | INTRAMUSCULAR | Status: AC
  Administered 2017-05-29 (×2): 250 mg via INTRAVENOUS
  Filled 2017-05-29 (×2): qty 250

## 2017-05-29 MED ORDER — FUROSEMIDE 10 MG/ML IJ SOLN: 40.0000 mg | Freq: Three times a day (TID) | INTRAMUSCULAR | Status: DC

## 2017-05-29 MED ORDER — BISACODYL 10 MG RE SUPP
10.0000 mg | Freq: Every day | RECTAL | Status: DC | PRN
  Administered 2017-05-29: 10 mg via RECTAL
  Filled 2017-05-29: qty 1

## 2017-05-29 MED ORDER — DOCUSATE SODIUM 50 MG/5ML PO LIQD
100.0000 mg | Freq: Two times a day (BID) | ORAL | Status: DC | PRN
  Administered 2017-05-29 – 2017-05-30 (×2): 100 mg
  Filled 2017-05-29 (×3): qty 10

## 2017-05-29 NOTE — Progress Notes (Signed)
Upon instilling PO meds into OG tube, observed meds returning out of oral cavity. No sign of aspiration. NP informed. New tube replaced and awaiting xray confirmation. Rx informed and replacement meds being processed and sent.

## 2017-05-29 NOTE — Progress Notes (Signed)
Results for Kelli Velasquez, Kelli Velasquez (MRN [id number]) as of 05/29/2017 16:05  Ref. Range 05/28/2017 19:26 05/29/2017 00:08 05/29/2017 04:03 05/29/2017 08:33 05/29/2017 12:46  Glucose-Capillary Latest Ref Range: 65 - 99 mg/dL 239 (H) 203 (H) 246 (H) 236 (H) 172 (H)  Received diabetes coordinator consult. Patient is on continuous tube feedings and NPO, on mechanical ventilation.  Recommend being on ICU hyperglycemia protocol or adding Novolog 3-4 units every 4 hours for tube feed coverage along with Novolog RESISTANT correction scale every 4 hours.  If on tube feed coverage, the insulin would be held if tube feedings were stopped. A low dose of Lantus 10 units every 24 hours could be added to regimen if blood sugars continue to be greater than 180 mg/dl. Will continue to monitor blood sugars while in the hospital.  Harvel Ricks RN BSN CDE Diabetes Coordinator Pager: [phone]  8am-5pm

## 2017-05-29 NOTE — Progress Notes (Signed)
PULMONARY / CRITICAL CARE MEDICINE   Name: Kelli Velasquez MRN: [id number] DOB: 1941-04-15    ADMISSION DATE:  05/26/2017  REFERRING MD:  EDP  CHIEF COMPLAINT:  Respiratory failure   HISTORY OF PRESENT ILLNESS:   76yo female former smoker (quit 1984) with hx CAD, HTN, CHF, BOOP followed by Dr. Melvyn Velasquez on chronic prednisone presented 10/31 with progressive SOB, cough, wheezing, BLE edema.  She had significant respiratory distress in ER and failed trial bipap requiring intubation.  Initial w/u revealed BNP >700, WBC 21.4 (chronic steroids), hypercarbic respiratory failure with pH 7.211, PCO2 99.4 prior to intubation.  PCCM called for ICU admission and management.   Per husband she has been more SOB over last 1-2 weeks.  Worsening orthopnea.    SUBJECTIVE:   Sedated, bradycardic per monitor.   VITAL SIGNS: BP 97/61   Pulse (!) 56   Temp 98.8 F (37.1 C) (Oral)   Resp 20   Ht 5\' 4"  (1.626 m)   Wt 246 lb 0.5 oz (111.6 kg)   LMP 07/27/1980   SpO2 95%   BMI 42.23 kg/m   HEMODYNAMICS:    VENTILATOR SETTINGS: Vent Mode: PRVC FiO2 (%):  [40 %-70 %] 60 % Set Rate:  [20 bmp] 20 bmp Vt Set:  [460 mL] 460 mL PEEP:  [8 cmH20-10 cmH20] 10 cmH20 Plateau Pressure:  [21 cmH20-25 cmH20] 23 cmH20  INTAKE / OUTPUT: I/O last 3 completed shifts: In: 1937.3 [I.V.:1194; NG/GT:693.3; IV Piggyback:50] Out: 8050 [Urine:8050]  PHYSICAL EXAMINATION:  General:  Obese female, NAD on vent , sedated Neuro:  Sedated, moves extremities with WUA, , RASS -2 HEENT:  Mm moist, oral ETT, OG , No LAD, thick neck, oral thrush Cardiovascular:  s1s2 rrr, no RMG Lungs:  resps even non labored on vent, diminished bases Abdomen:  Round, soft, obese, BS +, non-tender Musculoskeletal:  Warm and dry, scant BLE  LABS:  BMET  Recent Labs Lab 05/28/17 0243 05/28/17 1903 05/29/17 0659  NA 141 141 141  K 4.5 3.0* 2.8*  CL 90* 85* 84*  CO2 38* 40* 42*  BUN 29* 42* 53*  CREATININE 1.14* 1.49* 1.58*  GLUCOSE  183* 245* 232*    Electrolytes  Recent Labs Lab 05/28/17 0243 05/28/17 1903 05/28/17 2040 05/29/17 0659  CALCIUM 9.6 9.0  --  9.0  MG 2.3  --  2.1 2.5*  PHOS 4.2  --   --  6.6*    CBC  Recent Labs Lab 05/27/17 0249 05/28/17 0243 05/29/17 0659  WBC 18.5* 22.5* 25.2*  HGB 11.3* 13.3 13.1  HCT 38.1 44.0 42.1  PLT 164 185 151    Coag's No results for input(s): APTT, INR in the last 168 hours.  Sepsis Markers  Recent Labs Lab 05/26/17 1124  PROCALCITON <0.10    ABG  Recent Labs Lab 05/26/17 1032 05/28/17 0443 05/29/17 0342  PHART 7.326* 7.539* 7.495*  PCO2ART 79.5* 48.9* 59.3*  PO2ART 81.0* 64.6* 93.6    Liver Enzymes No results for input(s): AST, ALT, ALKPHOS, BILITOT, ALBUMIN in the last 168 hours.  Cardiac Enzymes  Recent Labs Lab 05/26/17 1124 05/26/17 1848 05/27/17 0038  TROPONINI 0.05* 0.04* 0.04*    Glucose  Recent Labs Lab 05/28/17 1155 05/28/17 1454 05/28/17 1926 05/29/17 0008 05/29/17 0403 05/29/17 0833  GLUCAP 148* 176* 239* 203* 246* 236*    Imaging Dg Chest Port 1 View  Result Date: 05/29/2017 CLINICAL DATA:  History of ETT. EXAM: PORTABLE CHEST 1 VIEW COMPARISON:  May 28, 2017  FINDINGS: The ETT terminates 3 cm above the carina. The NG tube terminates below today's film. No pneumothorax. A right-sided pleural effusion with underlying opacity is stable. A probable small effusion on the left with underlying opacity is stable. No other interval changes. Stable cardiomegaly and cardiomediastinal silhouette. IMPRESSION: 1. Bilateral pleural effusions with underlying opacities as above. 2. Stable support apparatus. 3. No other change. Electronically Signed   By: Kelli Velasquez M.D   On: 05/29/2017 08:04    STUDIES:  Echo 10/31>>> EF 35-40%, diffuse hypokinesis  CULTURES: BC x 2 10/31>>> No growth  ANTIBIOTICS: Diflucan ( Oral thrush) 11/3>>    SIGNIFICANT EVENTS:  LINES/TUBES: ETT 10/31>>>  DISCUSSION: 76yo  female with hx BOOP, CHF, HTN, probable OHS presenting with acute on chronic respiratory failure likely r/t CHF.   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 Bilateral pleural effusions with underlying opacities as above.  P:   Continue Vent support - 8cc/kg   Titrate oxygen for sats > 92% Continue home steroids  CXR 11/4>> follow up effusions and ? opacities VAP precautions SBT daily when FiO2 allows WUA daily  CARDIOVASCULAR Acute on chronic CHF  Hx HTN  Bradycardia>>  Net Negative 8 L  P:  Echo with EF 35-40% and diffuse hypokinesis.  Lasix gtt off 11/3 for arrhythmias and K of 2.8 Diamox Q 8 x 2 doses HF team consulted, appreciate recs: consider cath once stabilized, add 12.5mg  spiro and 12.m5 losartan daily.  Hold home norvasc for now  BP control - spiro and losartan as above Minimize propofol >> consider fentanyl gtt EEG 11/4  am  RENAL No active issue Serum creatinine and BUN increase Hypokalemia Hyperphos  P:  Avoid nephro toxic meds  Monitor Scr/ BUN off lasix gtt  F/u chem 1500 11/3 and 11/4 am Monitor UO Replete electrolytes as needed  GASTROINTESTINAL GERD  P:   PPI  NPO for now  TF  HEMATOLOGIC leukocytosis - likely r/t chronic steroids  P:  F/u cbc  Sq heparin   INFECTIOUS Leukocytosis - likely r/t steroids. T Max 100.1, no obvious infiltrate.  PCT negative. WBC increased, ? hemoconcentration in the setting of diuresis. Oral thrush Plan:  Monitor off  ABX for now Diflucan started 11/3 for oral thrush Follow fever curve and WBC Culture if clinically indicated  ENDOCRINE Hyperglycemia   P:   SSI   NEUROLOGIC AMS - r/t hypercarbia  Sedation needs on vent  P:   RASS goal: -1 Propofol, PRN fent  Consider switch to  Fentanyl gtt if continued bradycardia  FAMILY  - Updates: husband and son updated at bedside - Inter-disciplinary family meet or Palliative Care meeting due by:  Day 7   Kelli Velasquez, AGACNP-BC [place] Pager # [phone] 05/29/2017 at 10:29  Attending Note:  76 year old female with CHF history who presents with fluid overload and respiratory failure.  On exam, crackles noted.  I reviewed CXR myself, ETT ok and pulmonary edema noted.  D/C propofol and start precedex.  Continue diureses but with pushes rather than drip.  Replace electrolytes.  Family updated bedside, full code for now, continue diureses, decrease FiO2 to 50 and goal today to 40% and PEEP 8.  The patient is critically ill with multiple organ systems failure and requires high complexity decision making  for assessment and support, frequent evaluation and titration of therapies, application of advanced monitoring technologies and extensive interpretation of multiple databases.   Critical Care Time devoted to patient care services described in this note is  35  Minutes. This time reflects time of care of this signee Dr Jennet Maduro. This critical care time does not reflect procedure time, or teaching time or supervisory time of PA/NP/Med student/Med Resident etc but could involve care discussion time.  Rush Farmer, M.D. [facility] Pulmonary/Critical Care Medicine. Pager: [phone]. After hours pager: [phone].

## 2017-05-29 NOTE — Progress Notes (Addendum)
Patient ID: Kelli Velasquez, female   DOB: 1940-08-24, 76 y.o.   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 She has not been on bb due to wheezing and bronchitis.  Over the last 2 weeks she has had increased dyspnea with exertion, she was short of breath at rest on the day of admission.  Her husband says she has been taking 20 mg of lasix daily and occasionally an extra 20 mg of lasix.   Presented to [facility] ED with respiratory distress. Family reported increased dyspnea over the last week requiring 2 liters oxygen at home. In ED she received solumedrol and duonebs. Placed on Bipap but continue decline requiring intubation. CXR concerning for interstitial edema and bilateral effusions.  Remains intubated/sedated. FiO2 60% PEEP 10. Lasix drip stopped last night with bradycardia into 40s. K 2.8 which was repleted. Weight down another 9 pounds (16 pounds total) . Creatinine 0.9-> 1.1->1.5-> 1.6  CXR Personally reviewed no CHF small bilateral effusions R>L  Studies:  Echo 05/26/2017 EF 35-40%  Grade I DD RV normal Echo 2017 EF 45-50% with WMA Myoview 2017 No significant ischemia EF 48%.  [place] 2011- DES LAD and RCA.   Objective:   Weight Range: 111.6 kg (246 lb 0.5 oz) Body mass index is 42.23 kg/m.   Vital Signs:   Temp:  [98.2 F (36.8 C)-100.1 F (37.8 C)] 98.8 F (37.1 C) (11/03 0850) Pulse Rate:  [48-96] 64 (11/03 1100) Resp:  [18-29] 20 (11/03 1100) BP: (95-157)/(58-128) 120/79 (11/03 1000) SpO2:  [84 %-98 %] 96 % (11/03 1100) FiO2 (%):  [40 %-70 %] 60 % (11/03 0837) Weight:  [111.6 kg (246 lb 0.5 oz)] 111.6 kg (246 lb 0.5 oz) (11/03 0417)    Weight change: Filed Weights   05/27/17 0500 05/28/17 0429 05/29/17 0417  Weight: 119 kg (262 lb 5.6 oz) 116.1 kg (255 lb 15.3 oz) 111.6 kg (246 lb 0.5 oz)     Intake/Output:   Intake/Output Summary (Last 24 hours) at 05/29/17 1131 Last data filed at 05/29/17 0900  Gross per 24 hour  Intake             1407 ml  Output             4051 ml  Net            -2644 ml      Physical Exam    General:  Chronically ill-appearing obese woman sedated on vent  HEENT: normal x for ETT Neck: supple. Very thick. Unable to see CVP. Carotids 2+ bilat; no bruits. No lymphadenopathy or thryomegaly appreciated. Cor: PMI non palpable. Distant HS. Irregular Lungs: clear anteriorly  Abdomen: obese soft, nontender, nondistended. No hepatosplenomegaly. No bruits or masses. Good bowel sounds. Extremities: no cyanosis, clubbing, rash, trace edema +SCDs Neuro: sedated on vent    Telemetry   NSR/sinus brady with frequent PACs. No AF rates high 40s-> 80 . Personally reviewed    Labs    CBC  Recent Labs  05/28/17 0243 05/29/17 0659  WBC 22.5* 25.2*  HGB 13.3 13.1  HCT 44.0 42.1  MCV 98.0 95.9  PLT 185 818   Basic Metabolic Panel  Recent Labs  05/28/17 0243 05/28/17 1903 05/28/17 2040 05/29/17 0659  NA 141 141  --  141  K 4.5 3.0*  --  2.8*  CL 90* 85*  --  84*  CO2 38* 40*  --  42*  GLUCOSE 183* 245*  --  232*  BUN 29* 42*  --  53*  CREATININE 1.14* 1.49*  --  1.58*  CALCIUM 9.6 9.0  --  9.0  MG 2.3  --  2.1 2.5*  PHOS 4.2  --   --  6.6*   Liver Function Tests No results for input(s): AST, ALT, ALKPHOS, BILITOT, PROT, ALBUMIN in the last 72 hours. No results for input(s): LIPASE, AMYLASE in the last 72 hours. Cardiac Enzymes  Recent Labs  05/26/17 1848 05/27/17 0038  TROPONINI 0.04* 0.04*    BNP: BNP (last 3 results)  Recent Labs  05/26/17 0610  BNP 760.9*    ProBNP (last 3 results)  Recent Labs  01/11/17 1053  PROBNP 264.0*     D-Dimer No results for input(s): DDIMER in the last 72 hours. Hemoglobin A1C No results for input(s): HGBA1C in the last 72 hours. Fasting Lipid Panel  Recent Labs   05/29/17 0659  TRIG 243*   Thyroid Function Tests No results for input(s): TSH, T4TOTAL, T3FREE, THYROIDAB in the last 72 hours.  Invalid input(s): FREET3  Other results:   Imaging    Dg Chest Port 1 View  Result Date: 05/29/2017 CLINICAL DATA:  History of ETT. EXAM: PORTABLE CHEST 1 VIEW COMPARISON:  May 28, 2017 FINDINGS: The ETT terminates 3 cm above the carina. The NG tube terminates below today's film. No pneumothorax. A right-sided pleural effusion with underlying opacity is stable. A probable small effusion on the left with underlying opacity is stable. No other interval changes. Stable cardiomegaly and cardiomediastinal silhouette. IMPRESSION: 1. Bilateral pleural effusions with underlying opacities as above. 2. Stable support apparatus. 3. No other change. Electronically Signed   By: Dorise Bullion III M.D   On: 05/29/2017 08:04   Dg Abd Portable 1v  Result Date: 05/29/2017 CLINICAL DATA:  Of OG tube placement. EXAM: PORTABLE ABDOMEN - 1 VIEW COMPARISON:  None. FINDINGS: The OG tube terminates in the left upper quadrant, likely within the stomach. Probable small effusion and atelectasis in the left base. IMPRESSION: The OG tube terminates in the left upper quadrant. Electronically Signed   By: Dorise Bullion III M.D   On: 05/29/2017 11:02     Medications:     Scheduled Medications: . acetaZOLAMIDE  250 mg Intravenous Q12H  . amLODipine  10 mg Per Tube Daily  . aspirin  81 mg Per Tube Daily  . chlorhexidine gluconate (MEDLINE KIT)  15 mL Mouth Rinse BID  . ezetimibe  10 mg Per Tube QHS  . feeding supplement (PRO-STAT SUGAR FREE 64)  30 mL Per Tube QID  . feeding supplement (VITAL HIGH PROTEIN)  1,000 mL Per Tube Q24H  . furosemide  40 mg Intravenous Q8H  . heparin  5,000 Units Subcutaneous Q8H  . insulin aspart  0-20 Units Subcutaneous Q4H  . mouth rinse  15 mL Mouth Rinse QID  . methylPREDNISolone (SOLU-MEDROL) injection  20 mg Intravenous Q12H  . montelukast   10 mg Per Tube QHS  . pantoprazole sodium  40 mg Per Tube Daily  . potassium chloride      . spironolactone  12.5 mg Oral Daily    Infusions: . sodium chloride    . 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  Admitted with acute/chronic respiratory failure  Assessment/Plan   1. Acute/chronic systolic CHF: Probably ischemic cardiomyopathy given prior history.  Echo this admission with EF down to 35-40% range, around 50% in past.  She was admitted with worsening dyspnea, elevated BNP, CXR with pulmonary edema. PCT not elevated.  Has history of BOOP.   - Volume overload much improved. Creatinine climbing. I think this is about as dry as we can get her. Discussed with CCM. Given ongoing high O2 requirement will give 2 more doses of IV lasix then hold. Continue diamix. - Off losartan due to worsening renal function  - With low K will continue spironolactone 12.5 daily  - She has not tolerated beta blockers in the past due to wheezing, would consider starting low dose of beta-1 specific bisoprolol when respiratory status improved - With fall in EF, would plan left/right heart cath once she is diuresed and stabilized, likely next week.  2. Acute hypoxemic respiratory failure: Likely multifactorial due to pulmonary edema, effusions, BOOP and OHS.  Continues with high O2 and PEEP requirement despite diuresis. Will push diuresis a bit more. She is getting IV Solumedrol given BOOP.  At baseline, she is on 2L home oxygen.  3. CAD: h/o DES to LAD and RCA in 2011.  No chest pain but admitted with severe dyspnea/volume overload and fall in EF on echo.  Troponin mildly elevated but no trend. Suspect most likely demand ischemia from volume overload.  -  Given fall in EF, would plan cath once she is extubated/diuresed. 4. Bradycardia  - Sinus brady and PACs on tele. No block or pauses. Suspect due to respiratory status.  - Avoid AV nodal blockers. Supp K. Keep on tele. Mg 2.5 5. Hypokalemia - Supped 6. Morbid obesity  - Needs weight loss 7. AKI - likely due to diuresis. Will follow closely.   CRITICAL CARE Performed by: Glori Bickers  Total critical care time: 35 minutes  Critical care time was exclusive of separately billable procedures and treating other patients.  Critical care was necessary to treat or prevent imminent or life-threatening deterioration.  Critical care was time spent personally by me (independent of midlevel providers or residents) on the following activities: development of treatment plan with patient and/or surrogate as well as nursing, discussions with consultants, evaluation of patient's response to treatment, examination of patient, obtaining history from patient or surrogate, ordering and performing treatments and interventions, ordering and review of laboratory studies, ordering and review of radiographic studies, pulse oximetry and re-evaluation of patient's condition.   Length of Stay: 3   Glori Bickers, MD  05/29/2017, 11:31 AM  Advanced Heart Failure Team Pager [phone] (M-F; 7a - 4p)  Please contact [facility] for night-coverage after hours (4p -7a ) and weekends on [email or web address]

## 2017-05-30 ENCOUNTER — Inpatient Hospital Stay (HOSPITAL_COMMUNITY): Payer: Medicare Other

## 2017-05-30 DIAGNOSIS — Z7189 Other specified counseling: Secondary | ICD-10-CM

## 2017-05-30 LAB — BASIC METABOLIC PANEL
ANION GAP: 15 (ref 5–15)
ANION GAP: 13 (ref 5–15)
BUN: 79 mg/dL — AB (ref 6–20)
BUN: 77 mg/dL — ABNORMAL HIGH (ref 6–20)
CALCIUM: 9.4 mg/dL (ref 8.9–10.3)
CO2: 35 mmol/L — ABNORMAL HIGH (ref 22–32)
CO2: 35 mmol/L — AB (ref 22–32)
Calcium: 9.2 mg/dL (ref 8.9–10.3)
Chloride: 93 mmol/L — ABNORMAL LOW (ref 101–111)
Chloride: 98 mmol/L — ABNORMAL LOW (ref 101–111)
Creatinine, Ser: 1.39 mg/dL — ABNORMAL HIGH (ref 0.44–1.00)
Creatinine, Ser: 1.38 mg/dL — ABNORMAL HIGH (ref 0.44–1.00)
GFR calc Af Amer: 42 mL/min — ABNORMAL LOW (ref >60)
GFR calc non Af Amer: 36 mL/min — ABNORMAL LOW (ref >60)
GFR, EST AFRICAN AMERICAN: 41 mL/min — AB (ref >60)
GFR, EST NON AFRICAN AMERICAN: 36 mL/min — AB (ref >60)
GLUCOSE: 215 mg/dL — AB (ref 65–99)
Glucose, Bld: 228 mg/dL — ABNORMAL HIGH (ref 65–99)
POTASSIUM: 2.8 mmol/L — AB (ref 3.5–5.1)
POTASSIUM: 4 mmol/L (ref 3.5–5.1)
SODIUM: 143 mmol/L (ref 135–145)
Sodium: 146 mmol/L — ABNORMAL HIGH (ref 135–145)

## 2017-05-30 LAB — CBC
HCT: 43.3 % (ref 36.0–46.0)
HEMOGLOBIN: 12.9 g/dL (ref 12.0–15.0)
MCH: 29.2 pg (ref 26.0–34.0)
MCHC: 29.8 g/dL — AB (ref 30.0–36.0)
MCV: 98 fL (ref 78.0–100.0)
Platelets: 170 10*3/uL (ref 150–400)
RBC: 4.42 MIL/uL (ref 3.87–5.11)
RDW: 15.5 % (ref 11.5–15.5)
WBC: 24 10*3/uL — ABNORMAL HIGH (ref 4.0–10.5)

## 2017-05-30 LAB — BLOOD GAS, ARTERIAL
Acid-Base Excess: 15.3 mmol/L — ABNORMAL HIGH (ref 0.0–2.0)
Bicarbonate: 40.1 mmol/L — ABNORMAL HIGH (ref 20.0–28.0)
Drawn by: 41875
FIO2: 50
O2 Saturation: 93 %
PATIENT TEMPERATURE: 98.6
PCO2 ART: 54.8 mmHg — AB (ref 32.0–48.0)
PEEP: 10 cmH2O
PO2 ART: 69.1 mmHg — AB (ref 83.0–108.0)
RATE: 20 resp/min
VT: 460 mL
pH, Arterial: 7.477 — ABNORMAL HIGH (ref 7.350–7.450)

## 2017-05-30 LAB — GLUCOSE, CAPILLARY
GLUCOSE-CAPILLARY: 156 mg/dL — AB (ref 65–99)
GLUCOSE-CAPILLARY: 161 mg/dL — AB (ref 65–99)
GLUCOSE-CAPILLARY: 194 mg/dL — AB (ref 65–99)
GLUCOSE-CAPILLARY: 260 mg/dL — AB (ref 65–99)
Glucose-Capillary: 187 mg/dL — ABNORMAL HIGH (ref 65–99)
Glucose-Capillary: 191 mg/dL — ABNORMAL HIGH (ref 65–99)

## 2017-05-30 LAB — MAGNESIUM: MAGNESIUM: 2.5 mg/dL — AB (ref 1.7–2.4)

## 2017-05-30 LAB — TROPONIN I
TROPONIN I: 0.06 ng/mL — AB (ref <0.03)
Troponin I: 0.08 ng/mL (ref <0.03)
Troponin I: 0.22 ng/mL (ref <0.03)

## 2017-05-30 MED ORDER — POTASSIUM CHLORIDE 20 MEQ/15ML (10%) PO SOLN
ORAL | Status: AC
  Administered 2017-05-30: 40 meq via ORAL
  Filled 2017-05-30: qty 30

## 2017-05-30 MED ORDER — POTASSIUM CHLORIDE 10 MEQ/100ML IV SOLN
10.0000 meq | INTRAVENOUS | Status: DC
  Administered 2017-05-30 (×2): 10 meq via INTRAVENOUS
  Filled 2017-05-30 (×2): qty 100

## 2017-05-30 MED ORDER — POTASSIUM CHLORIDE 10 MEQ/100ML IV SOLN
10.0000 meq | INTRAVENOUS | Status: AC
  Administered 2017-05-30 (×4): 10 meq via INTRAVENOUS
  Filled 2017-05-30 (×4): qty 100

## 2017-05-30 MED ORDER — POTASSIUM CHLORIDE 20 MEQ/15ML (10%) PO SOLN
60.0000 meq | Freq: Once | ORAL | Status: DC
  Filled 2017-05-30: qty 45

## 2017-05-30 MED ORDER — POTASSIUM CHLORIDE CRYS ER 10 MEQ PO TBCR
20.0000 meq | EXTENDED_RELEASE_TABLET | Freq: Every day | ORAL | Status: DC
  Administered 2017-05-31: 20 meq via ORAL
  Filled 2017-05-30 (×3): qty 2

## 2017-05-30 MED ORDER — POTASSIUM CHLORIDE 20 MEQ/15ML (10%) PO SOLN
40.0000 meq | Freq: Once | ORAL | Status: AC
  Administered 2017-05-30: 40 meq via ORAL

## 2017-05-30 MED ORDER — SPIRONOLACTONE 25 MG PO TABS
25.0000 mg | ORAL_TABLET | Freq: Two times a day (BID) | ORAL | Status: DC
  Administered 2017-05-30 – 2017-06-01 (×5): 25 mg via ORAL
  Filled 2017-05-30 (×5): qty 1

## 2017-05-30 MED ORDER — FUROSEMIDE 10 MG/ML IJ SOLN
40.0000 mg | Freq: Three times a day (TID) | INTRAMUSCULAR | Status: AC
  Administered 2017-05-30 (×2): 40 mg via INTRAVENOUS
  Filled 2017-05-30 (×2): qty 4

## 2017-05-30 NOTE — Progress Notes (Signed)
RT instructed pt and family on the use of incentive spirometer, pt able to reach 500 mL.

## 2017-05-30 NOTE — Progress Notes (Signed)
CRITICAL VALUE ALERT  Critical Value:  Trop I = 0.06  Date & Time Notied:  05/30/17 0619  Provider Notified: Dr Oletta Darter  Orders Received/Actions taken: yes

## 2017-05-30 NOTE — Progress Notes (Signed)
Patient ID: Kelli Velasquez, female   DOB: 04-May-1941, 76 y.o.   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 She has not been on bb due to wheezing and bronchitis.  Over the last 2 weeks she has had increased dyspnea with exertion, she was short of breath at rest on the day of admission.  Her husband says she has been taking 20 mg of lasix daily and occasionally an extra 20 mg of lasix.   Presented to [facility] ED with respiratory distress. Family reported increased dyspnea over the last week requiring 2 liters oxygen at home. In ED she received solumedrol and duonebs. Placed on Bipap but continue decline requiring intubation. CXR concerning for interstitial edema and bilateral effusions.  Extubated this am. Still feels SOB. On intermittent lasix with good urine output. HR much faster since she has been extubated. Weight down 3 pounds overnight. 19 pounds total    CXR Personally reviewed this am  no CHF persistent small bilateral effusions R>L  Studies:  Echo 05/26/2017 EF 35-40%  Grade I DD RV normal Echo 2017 EF 45-50% with WMA Myoview 2017 No significant ischemia EF 48%.  [place] 2011- DES LAD and RCA.   Objective:   Weight Range: 110.4 kg (243 lb 6.2 oz) Body mass index is 41.78 kg/m.   Vital Signs:   Temp:  [97.6 F (36.4 C)-99.5 F (37.5 C)] 98.4 F (36.9 C) (11/04 1158) Pulse Rate:  [50-128] 76 (11/04 1400) Resp:  [18-30] 21 (11/04 1400) BP: (95-169)/(54-96) 127/93 (11/04 1400) SpO2:  [85 %-96 %] 91 % (11/04 1400) FiO2 (%):  [40 %-50 %] 40 % (11/04 1114) Weight:  [110.4 kg (243 lb 6.2 oz)] 110.4 kg (243 lb 6.2 oz) (11/04 0400) Last BM Date: 05/29/17  Weight change: Filed Weights   05/28/17 0429 05/29/17 0417 05/30/17 0400  Weight: 116.1 kg (255 lb 15.3 oz) 111.6 kg (246 lb 0.5 oz) 110.4 kg (243 lb 6.2 oz)     Intake/Output:   Intake/Output Summary (Last 24 hours) at 05/30/2017 1451 Last data filed at 05/30/2017 1445 Gross per 24 hour  Intake 728.51 ml  Output 3035 ml  Net -2306.49 ml      Physical Exam    General:  Morbidly obese. Ill-appearing woman lying in bed, Mildly dyspneic. Hard of hearing  HEENT: normal Neck: supple. Very thick neck. Unable to see JVP. Carotids 2+ bilat; no bruits. No lymphadenopathy or thryomegaly appreciated. Cor: PMI nonpalpable. Mildly irregular very distant . Soft SEM at RUSB murmurs. Lungs: markedly decreased anteriorly. No wheeze Abdomen: obese soft, nontender, nondistended. Unable to assess for hepatosplenomegaly. No bruits or masses. Good bowel sounds. Extremities: no cyanosis, clubbing, rash, edema Neuro: alert & orientedx3,HOH. moves all 4 extremities w/o difficulty. Affect pleasant    Telemetry   NSR 90-100 with frequent PACs/ PVCs Personally reviewed    Labs    CBC Recent Labs    05/29/17 0659 05/30/17 0511  WBC 25.2* 24.0*  HGB 13.1 12.9  HCT 42.1 43.3  MCV 95.9 98.0  PLT 151 244   Basic Metabolic Panel Recent Labs    05/28/17 0243  05/28/17 2040 05/29/17 0659 05/29/17 1436 05/30/17 0511  NA 141   < >  --  141 142 143  K 4.5   < >  --  2.8* 3.4* 2.8*  CL 90*   < >  --  84* 92* 93*  CO2 38*   < >  --  42* 36* 35*  GLUCOSE 183*   < >  --  232* 209* 228*  BUN 29*   < >  --  53* 62* 79*  CREATININE 1.14*   < >  --  1.58* 1.50* 1.39*  CALCIUM 9.6   < >  --  9.0 8.9 9.2  MG 2.3  --  2.1 2.5*  --   --   PHOS 4.2  --   --  6.6*  --   --    < > = values in this interval not displayed.   Liver Function Tests No results for input(s): AST, ALT, ALKPHOS, BILITOT, PROT, ALBUMIN in the last 72 hours. No results for input(s): LIPASE, AMYLASE in the last 72 hours. Cardiac Enzymes Recent Labs    05/30/17 0511 05/30/17 1025  TROPONINI 0.06* 0.08*    BNP: BNP (last 3 results) Recent Labs    05/26/17 0610  BNP 760.9*     ProBNP (last 3 results) Recent Labs    01/11/17 1053  PROBNP 264.0*     D-Dimer No results for input(s): DDIMER in the last 72 hours. Hemoglobin A1C No results for input(s): HGBA1C in the last 72 hours. Fasting Lipid Panel Recent Labs    05/29/17 0659  TRIG 243*   Thyroid Function Tests No results for input(s): TSH, T4TOTAL, T3FREE, THYROIDAB in the last 72 hours.  Invalid input(s): FREET3  Other results:   Imaging    Dg Chest Port 1 View  Result Date: 05/30/2017 CLINICAL DATA:  76 year old female presenting with respiratory failure an altered mental status. History of chronic prednisone for BOOP. EXAM: PORTABLE CHEST 1 VIEW COMPARISON:  05/29/2017 and earlier. FINDINGS: Portable AP semi upright view at 0450 hours. Stable endotracheal tube tip just below the clavicles. Enteric tube courses to the abdomen, side hole up the level of the stomach. Stable lung volumes. Stable cardiac size and mediastinal contours. Continued confluent in veiling opacities at both lung bases. Stable vascularity without edema. No pneumothorax. No areas of worsening ventilation. IMPRESSION: 1.  Stable lines and tubes. 2. Stable ventilation with bibasilar opacity compatible with small pleural effusions plus collapse or consolidation. Electronically Signed   By: Genevie Ann M.D.   On: 05/30/2017 06:57     Medications:     Scheduled Medications: . amLODipine  10 mg Per Tube Daily  . aspirin  81 mg Per Tube Daily  . chlorhexidine gluconate (MEDLINE KIT)  15 mL Mouth Rinse BID  . ezetimibe  10 mg Per Tube QHS  . feeding supplement (PRO-STAT SUGAR FREE 64)  30 mL Per Tube QID  . feeding supplement (VITAL HIGH PROTEIN)  1,000 mL Per Tube Q24H  . furosemide  40 mg Intravenous Q8H  . heparin  5,000 Units Subcutaneous Q8H  . insulin aspart  0-20 Units Subcutaneous Q4H  . mouth rinse  15 mL Mouth Rinse QID  . methylPREDNISolone (SOLU-MEDROL) injection  20 mg Intravenous Q12H  . montelukast  10 mg Per  Tube QHS  . pantoprazole sodium  40 mg Per Tube Daily  . potassium chloride  60 mEq Oral Once  . spironolactone  12.5 mg Oral Daily    Infusions: . sodium chloride 250 mL (05/29/17 1940)  . fentaNYL infusion INTRAVENOUS Stopped (05/30/17 1200)  . 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  Admitted with acute/chronic respiratory failure  Assessment/Plan   1. Acute/chronic systolic CHF: Probably ischemic cardiomyopathy given prior history.  Echo this admission with EF down to 35-40% range, around 50% in past.  She was admitted with worsening dyspnea, elevated BNP, CXR with pulmonary edema. PCT not elevated.  Has history of BOOP.   - Volume overload much improved.  Weight down 16 pounds. Creatinine back down today and continues to respond to intermittent lasix. Will continue 40 IV q8 and diamox. Watch renal function closely. - Off losartan due to worsening renal function  - With low K will increase spironolactone to 25 daily. supp K  - She has not tolerated beta blockers in the past due to wheezing, would consider starting low dose of beta-1 specific bisoprolol when respiratory status improved. Would not start yet  - With fall in EF, would plan left/right heart cath once she is diuresed and stabilized, likely later this week  2. Acute hypoxemic respiratory failure: Likely multifactorial due to pulmonary edema, effusions, BOOP and OHS.   She is getting IV Solumedrol given BOOP.  At baseline, she is on 2L home oxygen - Extubated today but respiratory status still tenuous.  - Very diminished lung sounds on exam . No PFTs on chart - Improved with diuresis. Will continue carefully  3. CAD: h/o DES to LAD and RCA in 2011.  No  chest pain but admitted with severe dyspnea/volume overload and fall in EF on echo.  Troponin mildly elevated but no trend. Suspect most likely demand ischemia from volume overload.  - As above.  Given fall in EF, would plan cath once she is extubated/diuresed. 4. Bradycardia  - Resolved. Supp K. Keep on tele. Mg 2.5 yesterday 5. Hypokalemia - Supped 6. Morbid obesity  - Needs weight loss 7. AKI - creatinine improved today. Continue to follow    Length of Stay: 4   Glori Bickers, MD  05/30/2017, 2:51 PM  Advanced Heart Failure Team Pager [phone] (M-F; 7a - 4p)  Please contact [facility] for night-coverage after hours (4p -7a ) and weekends on [email or web address]

## 2017-05-30 NOTE — Progress Notes (Signed)
[facility] Progress Note Patient Name: Kelli Velasquez DOB: 1941-04-06 MRN: [id number]   Date of Service  05/30/2017  HPI/Events of Note  Multiple issues: Troponin = 0.04 .  and 2. K+ = 2.8 and Creatinine = 1.39. Demand ischemia? Cardiology already following.  Already on ASA.    eICU Interventions  Will order: 1. Replace K+. 2. Continue to trend Troponin.      Intervention Category Major Interventions: Electrolyte abnormality - evaluation and management Intermediate Interventions: Diagnostic test evaluation  [name] 05/30/2017, 6:27 AM

## 2017-05-30 NOTE — Progress Notes (Signed)
SLP Cancellation Note  Patient Details Name: [name] MRN: [id number] DOB: [birth year]   Cancelled treatment:       Reason Eval/Treat Not Completed: Patient not medically ready. Swallow orders received. Per MD notes extubation planned for today. Will follow up next date.  Deneise Lever, [place], [place] Speech-Language Pathologist [phone]   Aliene Altes 05/30/2017, 12:17 PM

## 2017-05-30 NOTE — Procedures (Signed)
Extubation Procedure Note  Patient Details:   Name: Kelli Velasquez DOB: 15-Aug-1940 MRN: [id number]   Airway Documentation:     Evaluation  O2 sats: stable throughout Complications: No apparent complications Patient did tolerate procedure well. Bilateral Breath Sounds: Rhonchi   Yes   Patient extubated to 6L nasal cannula per MD order.  Per order sats are to be 88-92%.  Currently at 89%.  Positive cuff leak noted.  No evidence of stridor.  Patient able to speak post extubation.  Vitals are stable.  No complications noted.  Philomena Doheny 05/30/2017, 12:23 PM

## 2017-05-30 NOTE — Progress Notes (Signed)
PULMONARY / CRITICAL CARE MEDICINE   Name: Kelli Velasquez MRN: [id number] DOB: 1941/01/28    ADMISSION DATE:  05/26/2017  REFERRING MD:  EDP  CHIEF COMPLAINT:  Respiratory failure   HISTORY OF PRESENT ILLNESS:   76yo female former smoker (quit 1984) with hx CAD, HTN, CHF, BOOP followed by Dr. Melvyn Novas on chronic prednisone presented 10/31 with progressive SOB, cough, wheezing, BLE edema.  She had significant respiratory distress in ER and failed trial bipap requiring intubation.  Initial w/u revealed BNP >700, WBC 21.4 (chronic steroids), hypercarbic respiratory failure with pH 7.211, PCO2 99.4 prior to intubation.  PCCM called for ICU admission and management.   Per husband she has been more SOB over last 1-2 weeks.  Worsening orthopnea.    SUBJECTIVE:   Awake and alert, following all commands  VITAL SIGNS: BP 115/72   Pulse 62   Temp 98.3 F (36.8 C) (Oral)   Resp (!) 30   Ht 5\' 4"  (1.626 m)   Wt 243 lb 6.2 oz (110.4 kg)   LMP 07/27/1980   SpO2 95%   BMI 41.78 kg/m   HEMODYNAMICS:    VENTILATOR SETTINGS: Vent Mode: PRVC FiO2 (%):  [40 %-60 %] 40 % Set Rate:  [20 bmp] 20 bmp Vt Set:  [460 mL] 460 mL PEEP:  [8 cmH20-10 cmH20] 8 cmH20 Plateau Pressure:  [22 KXF81-82 cmH20] 22 cmH20  INTAKE / OUTPUT: I/O last 3 completed shifts: In: 1635.3 [I.V.:645.3; NG/GT:940; IV Piggyback:50] Out: 9937 [JIRCV:8938; Stool:1]  PHYSICAL EXAMINATION:  General:  Obese female, NAD on vent , lightly sedated Neuro:  Nods appropriately to questions, moves extremities with WUA, , RASS -2 HEENT:  Mm moist, oral ETT, OG , No LAD, thick neck, oral thrush Cardiovascular:  s1s2 rrr, no RMG Lungs:  resps even non labored on vent, diminished bases Abdomen:  Round, soft, obese, BS +, non-tender Musculoskeletal:  Warm and dry, scant BLE  LABS:  BMET Recent Labs  Lab 05/29/17 0659 05/29/17 1436 05/30/17 0511  NA 141 142 143  K 2.8* 3.4* 2.8*  CL 84* 92* 93*  CO2 42* 36* 35*  BUN 53* 62*  79*  CREATININE 1.58* 1.50* 1.39*  GLUCOSE 232* 209* 228*    Electrolytes Recent Labs  Lab 05/28/17 0243  05/28/17 2040 05/29/17 0659 05/29/17 1436 05/30/17 0511  CALCIUM 9.6   < >  --  9.0 8.9 9.2  MG 2.3  --  2.1 2.5*  --   --   PHOS 4.2  --   --  6.6*  --   --    < > = values in this interval not displayed.    CBC Recent Labs  Lab 05/28/17 0243 05/29/17 0659 05/30/17 0511  WBC 22.5* 25.2* 24.0*  HGB 13.3 13.1 12.9  HCT 44.0 42.1 43.3  PLT 185 151 170    Coag's No results for input(s): APTT, INR in the last 168 hours.  Sepsis Markers Recent Labs  Lab 05/26/17 1124  PROCALCITON <0.10    ABG Recent Labs  Lab 05/28/17 0443 05/29/17 0342 05/30/17 0330  PHART 7.539* 7.495* 7.477*  PCO2ART 48.9* 59.3* 54.8*  PO2ART 64.6* 93.6 69.1*    Liver Enzymes No results for input(s): AST, ALT, ALKPHOS, BILITOT, ALBUMIN in the last 168 hours.  Cardiac Enzymes Recent Labs  Lab 05/26/17 1848 05/27/17 0038 05/30/17 0511  TROPONINI 0.04* 0.04* 0.06*    Glucose Recent Labs  Lab 05/29/17 1246 05/29/17 1610 05/29/17 1919 05/30/17 0038 05/30/17 0334 05/30/17 1017  GLUCAP 172* 231* 255* 156* 260* 194*    Imaging Dg Chest Port 1 View  Result Date: 05/30/2017 CLINICAL DATA:  76 year old female presenting with respiratory failure an altered mental status. History of chronic prednisone for BOOP. EXAM: PORTABLE CHEST 1 VIEW COMPARISON:  05/29/2017 and earlier. FINDINGS: Portable AP semi upright view at 0450 hours. Stable endotracheal tube tip just below the clavicles. Enteric tube courses to the abdomen, side hole up the level of the stomach. Stable lung volumes. Stable cardiac size and mediastinal contours. Continued confluent in veiling opacities at both lung bases. Stable vascularity without edema. No pneumothorax. No areas of worsening ventilation. IMPRESSION: 1.  Stable lines and tubes. 2. Stable ventilation with bibasilar opacity compatible with small pleural  effusions plus collapse or consolidation. Electronically Signed   By: Genevie Ann M.D.   On: 05/30/2017 06:57   Dg Abd Portable 1v  Result Date: 05/29/2017 CLINICAL DATA:  Of OG tube placement. EXAM: PORTABLE ABDOMEN - 1 VIEW COMPARISON:  None. FINDINGS: The OG tube terminates in the left upper quadrant, likely within the stomach. Probable small effusion and atelectasis in the left base. IMPRESSION: The OG tube terminates in the left upper quadrant. Electronically Signed   By: Dorise Bullion III M.D   On: 05/29/2017 11:02    STUDIES:  Echo 10/31>>> EF 35-40%, diffuse hypokinesis  CULTURES: BC x 2 10/31>>> No growth  ANTIBIOTICS: Diflucan ( Oral thrush) 11/3>>    SIGNIFICANT EVENTS: Admit 05/26/2017  LINES/TUBES: ETT 10/31>>>  DISCUSSION: 76yo female with hx BOOP, CHF, HTN, probable OHS presenting with acute on chronic respiratory failure likely r/t CHF.   ASSESSMENT / PLAN:  PULMONARY Acute on chronic hypercarbic respiratory failure  Pulmonary edema  Hx BOOP - on chronic steroids  Metabolic Alkalosis with compensated Respiratory Acidosis CXR 11/5 Stable ventilation with bibasilar opacity compatible with small pleural effusions plus collapse or consolidation. Weaned to 40% and PEEP  of 8 P:   Extubate today Titrate oxygen for sats > 92% Continue home steroids  May reintubate one more time per family's wishes but if fails again then full DNR and comfort VAP precautions Ambulate SLP  CARDIOVASCULAR Acute on chronic CHF  Hx HTN  Bradycardia>> resolved with d/c propofol Net Negative>> 11 L on 11/4  P:  Echo with EF 35-40% and diffuse hypokinesis.  Lasix gtt off 11/3 for arrhythmias and hypokalemia HF team consulted, appreciate recs: consider cath once stabilized, add 12.5mg  spiro and 12.m5 losartan daily.  Hold home norvasc for now  BP control - spiro and losartan as above EEG 11/5  am  RENAL Serum creatinine and BUN increase Hypokalemia>> repleted  11/4 Hyperphos Net Neg 11L since admit  P:  Avoid nephro toxic meds  Monitor Scr/ BUN off lasix gtt  F/u chem 1600 11/4 and 11/5 am Monitor UO Monitor mag and phos Replete electrolytes as needed  GASTROINTESTINAL GERD  P:   PPI  NPO for now  Tolerating TF  HEMATOLOGIC leukocytosis - likely r/t chronic steroids HGB stable  P:  F/u cbc  Sq heparin   INFECTIOUS Leukocytosis - likely r/t steroids. T Max 99.5, ?  obvious infiltrate.  PCT negative. WBC increased, ? hemoconcentration in the setting of diuresis. Oral thrush Plan:  Monitor off  ABX for now Diflucan started 11/3 for oral thrush Follow fever curve and WBC Culture if clinically indicated  ENDOCRINE Hyperglycemia   P: CBG Q 4   SSI   NEUROLOGIC AMS - r/t hypercarbia  Minimize Sedation needs  on vent Lip speaking and following simple commands  P:   RASS goal: -1  Tolerating fentanyl gtt at 35 mcg  FAMILY  - Updates: husband updated at bedside 11/4 - Inter-disciplinary family meet or Palliative Care meeting due by:  Day 7   Magdalen Spatz, AGACNP-BC [place] Pager # [phone] 05/30/2017 at 10:29  Attending Note:  76 year old female with PMH above intubated for pulmonary edema and heart failure.  On exam, coarse BS diffusely but more alert and interactive.  I reviewed CXR myself, improving pulmonary edema.  Discussed with PCCM-NP.  Will proceed with extubation today.  Spoke with husband and son.  May reintubate one more time if fails post extubation.  But if fails again then full DNR and comfort.  Full code for now.  Will give 2 more doses of lasix and a dose of diamox.  Replace electrolytes.  Will continue steroids at home dose and abx.  PCCM will follow.  The patient is critically ill with multiple organ systems failure and requires high complexity decision making for assessment and support, frequent evaluation and titration of therapies, application of advanced  monitoring technologies and extensive interpretation of multiple databases.   Critical Care Time devoted to patient care services described in this note is  35  Minutes. This time reflects time of care of this signee Dr Jennet Maduro. This critical care time does not reflect procedure time, or teaching time or supervisory time of PA/NP/Med student/Med Resident etc but could involve care discussion time.  Rush Farmer, M.D. [facility] Pulmonary/Critical Care Medicine. Pager: [phone]. After hours pager: [phone].

## 2017-05-31 ENCOUNTER — Other Ambulatory Visit: Payer: Self-pay

## 2017-05-31 ENCOUNTER — Inpatient Hospital Stay (HOSPITAL_COMMUNITY): Payer: Medicare Other

## 2017-05-31 LAB — CBC
HCT: 49.4 % — ABNORMAL HIGH (ref 36.0–46.0)
HEMOGLOBIN: 15.1 g/dL — AB (ref 12.0–15.0)
MCH: 30.8 pg (ref 26.0–34.0)
MCHC: 30.6 g/dL (ref 30.0–36.0)
MCV: 100.8 fL — ABNORMAL HIGH (ref 78.0–100.0)
PLATELETS: 200 10*3/uL (ref 150–400)
RBC: 4.9 MIL/uL (ref 3.87–5.11)
RDW: 16 % — ABNORMAL HIGH (ref 11.5–15.5)
WBC: 24.4 10*3/uL — ABNORMAL HIGH (ref 4.0–10.5)

## 2017-05-31 LAB — BASIC METABOLIC PANEL
ANION GAP: 11 (ref 5–15)
BUN: 77 mg/dL — ABNORMAL HIGH (ref 6–20)
CALCIUM: 9.7 mg/dL (ref 8.9–10.3)
CO2: 35 mmol/L — ABNORMAL HIGH (ref 22–32)
CREATININE: 1.31 mg/dL — AB (ref 0.44–1.00)
Chloride: 101 mmol/L (ref 101–111)
GFR, EST AFRICAN AMERICAN: 44 mL/min — AB (ref >60)
GFR, EST NON AFRICAN AMERICAN: 38 mL/min — AB (ref >60)
GLUCOSE: 170 mg/dL — AB (ref 65–99)
Potassium: 4.6 mmol/L (ref 3.5–5.1)
Sodium: 147 mmol/L — ABNORMAL HIGH (ref 135–145)

## 2017-05-31 LAB — GLUCOSE, CAPILLARY
GLUCOSE-CAPILLARY: 187 mg/dL — AB (ref 65–99)
GLUCOSE-CAPILLARY: 250 mg/dL — AB (ref 65–99)
GLUCOSE-CAPILLARY: 173 mg/dL — AB (ref 65–99)
Glucose-Capillary: 101 mg/dL — ABNORMAL HIGH (ref 65–99)
Glucose-Capillary: 130 mg/dL — ABNORMAL HIGH (ref 65–99)
Glucose-Capillary: 169 mg/dL — ABNORMAL HIGH (ref 65–99)
Glucose-Capillary: 202 mg/dL — ABNORMAL HIGH (ref 65–99)

## 2017-05-31 LAB — MAGNESIUM: MAGNESIUM: 2.5 mg/dL — AB (ref 1.7–2.4)

## 2017-05-31 LAB — PHOSPHORUS: Phosphorus: 4.8 mg/dL — ABNORMAL HIGH (ref 2.5–4.6)

## 2017-05-31 MED ORDER — PANTOPRAZOLE SODIUM 40 MG PO TBEC
40.0000 mg | DELAYED_RELEASE_TABLET | Freq: Every day | ORAL | Status: DC
  Administered 2017-05-31: 40 mg via ORAL
  Filled 2017-05-31: qty 1

## 2017-05-31 MED ORDER — INSULIN ASPART 100 UNIT/ML ~~LOC~~ SOLN: 0.0000 [IU] | Freq: Every day | SUBCUTANEOUS | Status: DC

## 2017-05-31 MED ORDER — INSULIN ASPART 100 UNIT/ML ~~LOC~~ SOLN
0.0000 [IU] | Freq: Three times a day (TID) | SUBCUTANEOUS | Status: DC
  Administered 2017-05-31: 5 [IU] via SUBCUTANEOUS
  Administered 2017-06-01: 3 [IU] via SUBCUTANEOUS
  Administered 2017-06-01 (×2): 5 [IU] via SUBCUTANEOUS

## 2017-05-31 MED ORDER — EZETIMIBE 10 MG PO TABS
10.0000 mg | ORAL_TABLET | Freq: Every day | ORAL | Status: DC
  Administered 2017-05-31 – 2017-06-01 (×2): 10 mg via ORAL
  Filled 2017-05-31 (×2): qty 1

## 2017-05-31 MED ORDER — ASPIRIN EC 81 MG PO TBEC
81.0000 mg | DELAYED_RELEASE_TABLET | Freq: Every day | ORAL | Status: DC
  Administered 2017-05-31 – 2017-06-01 (×2): 81 mg via ORAL
  Filled 2017-05-31 (×3): qty 1

## 2017-05-31 MED ORDER — MONTELUKAST SODIUM 10 MG PO TABS
10.0000 mg | ORAL_TABLET | Freq: Every day | ORAL | Status: DC
Start: 2017-05-31 — End: 2017-06-02
  Administered 2017-05-31 – 2017-06-01 (×2): 10 mg via ORAL
  Filled 2017-05-31 (×2): qty 1

## 2017-05-31 MED ORDER — PREDNISONE 20 MG PO TABS: 20.0000 mg | ORAL_TABLET | Freq: Every day | ORAL | Status: DC

## 2017-05-31 MED ORDER — ASPIRIN 81 MG PO CHEW: 81.0000 mg | CHEWABLE_TABLET | Freq: Every day | ORAL | Status: DC

## 2017-05-31 MED ORDER — ACETAZOLAMIDE 250 MG PO TABS
250.0000 mg | ORAL_TABLET | Freq: Once | ORAL | Status: AC
  Administered 2017-05-31: 250 mg via ORAL
  Filled 2017-05-31: qty 1

## 2017-05-31 MED ORDER — FUROSEMIDE 10 MG/ML IJ SOLN: 40.0000 mg | Freq: Two times a day (BID) | INTRAMUSCULAR | Status: DC

## 2017-05-31 MED ORDER — PREDNISONE 20 MG PO TABS
20.0000 mg | ORAL_TABLET | Freq: Every day | ORAL | Status: DC
  Administered 2017-05-31 – 2017-06-02 (×3): 20 mg via ORAL
  Filled 2017-05-31 (×4): qty 1

## 2017-05-31 MED ORDER — FUROSEMIDE 40 MG PO TABS
40.0000 mg | ORAL_TABLET | Freq: Two times a day (BID) | ORAL | Status: DC
  Administered 2017-05-31 – 2017-06-01 (×4): 40 mg via ORAL
  Filled 2017-05-31 (×5): qty 1

## 2017-05-31 MED ORDER — AMLODIPINE BESYLATE 10 MG PO TABS
10.0000 mg | ORAL_TABLET | Freq: Every day | ORAL | Status: DC
  Administered 2017-05-31 – 2017-06-01 (×2): 10 mg via ORAL
  Filled 2017-05-31: qty 1

## 2017-05-31 NOTE — Progress Notes (Signed)
RT instructed pt and family on the use of flutter valve. Pt able to demonstrate back good technique with a strong productive cough following use.  Pt using Yankauer to suction mouth.

## 2017-05-31 NOTE — Evaluation (Signed)
Clinical/Bedside Swallow Evaluation Patient Details  Name: Kelli Velasquez MRN: [id number] Date of Birth: 1941-04-04  Today's Date: 05/31/2017 Time: SLP Start Time (ACUTE ONLY): 2683 SLP Stop Time (ACUTE ONLY): 1305 SLP Time Calculation (min) (ACUTE ONLY): 20 min  Past Medical History:  Past Medical History:  Diagnosis Date  . Allergy    allergic rhinitis  . Asthma   . Chronic bronchitis ([place])   . Coronary artery disease    cath January 2011 with DEs LAD and RCA  . Dyspnea   . Full dentures   . Gallstones   . GERD (gastroesophageal reflux disease)   . History of echocardiogram    Echo 5/17: mod LVH, EF 50-55%, ant-septal HK, Gr 1 DD, mod LAE //  b.  Echo 7/17: mild concentric LVH, EF 45-50%, inf-lat, inf, inf-septal HK, mild LAE  . History of nuclear stress test    Myoview 7/17: EF 48%, small mild apical defect, no ischemia, low risk  . Hyperlipidemia   . Hypertension   . 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 Myopia   . 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 Nocturnal oxygen desaturation    o2 at night  . Obesity   . Osteoarthritis    knees, fingers, shoulders  . Overactive bladder   . Oxygen deficiency    uses oxygen all day  . Rash    and other non specific skin eruptions  . Retaining fluid    in ankles and feet  . Urinary incontinence   . Vertigo   . Wears glasses    Past Surgical History:  Past Surgical History:  Procedure Laterality Date  . CHOLECYSTECTOMY  92  . COLONOSCOPY    . CORONARY ANGIOPLASTY WITH STENT PLACEMENT  07/2009   stent  . DILATION AND CURETTAGE OF UTERUS  1984  . JOINT REPLACEMENT  2007   right total knee replacement  . TOTAL KNEE ARTHROPLASTY  2010   left , then vocal cord infection post op  . vocal cord polypectomy     HPI:  76yo female former smoker (quit 1984) with hx CAD, HTN, CHF, BOOP followed by Dr. Melvyn Novas on chronic prednisone presented 10/31 with  progressive SOB, cough, wheezing, BLE edema. She had significant respiratory distress in ER and failed trial bipap requiring intubation. Initial w/u revealed BNP >700, WBC 21.4 (chronic steroids), hypercarbic respiratory failure with pH 7.211, PCO2 99.4 prior to intubation from 10/31 to 11/4. Per husband she has been more SOB over last 1-2 weeks. Worsening orthopnea.    Assessment / Plan / Recommendation Clinical Impression  Pt demonstrates no immediate signs of aspiration with thin liquids. When asked to phonate after drinking pt did have a soft cough x2. 3 oz water test did not result in immediate signs of aspiration, though again slight cough/throat clear noted after phonation. Suspect potential for trace penetration events following 4 day intubation warranting f/u after diet initaition to check for tolerance. If concern persists will consider FEES for objective assessment of swallowing. Discussed with RN, pt and son.  SLP Visit Diagnosis: Dysphagia, oropharyngeal phase (R13.12)    Aspiration Risk  Mild aspiration risk    Diet Recommendation Regular;Thin liquid   Liquid Administration via: Cup;Straw Medication Administration: Whole meds with liquid Supervision: Patient able to self feed;Intermittent supervision to cue for compensatory strategies Compensations: Slow rate;Small sips/bites Postural Changes: Seated upright at 90 degrees    Other  Recommendations Oral Care Recommendations: Oral care BID   Follow up Recommendations 24 hour supervision/assistance      Frequency and Duration min 2x/week  1 week       Prognosis Prognosis for Safe Diet Advancement: Good      Swallow Study   General HPI: 76yo female former smoker (quit 1984) with hx CAD, HTN, CHF, BOOP followed by Dr. Melvyn Novas on chronic prednisone presented 10/31 with progressive SOB, cough, wheezing, BLE edema. She had significant respiratory distress in ER and failed trial bipap requiring intubation. Initial w/u  revealed BNP >700, WBC 21.4 (chronic steroids), hypercarbic respiratory failure with pH 7.211, PCO2 99.4 prior to intubation from 10/31 to 11/4. Per husband she has been more SOB over last 1-2 weeks. Worsening orthopnea.  Type of Study: Bedside Swallow Evaluation Previous Swallow Assessment: none Diet Prior to this Study: Regular;Thin liquids Temperature Spikes Noted: No Respiratory Status: Nasal cannula History of Recent Intubation: Yes Length of Intubations (days): 5 days Date extubated: 05/30/17 Behavior/Cognition: Alert;Cooperative Oral Cavity Assessment: Within Functional Limits Oral Care Completed by SLP: No Oral Cavity - Dentition: Adequate natural dentition Vision: Functional for self-feeding Self-Feeding Abilities: Able to feed self Patient Positioning: Upright in chair Baseline Vocal Quality: Hoarse;Breathy Volitional Cough: Strong Volitional Swallow: Able to elicit    Oral/Motor/Sensory Function Overall Oral Motor/Sensory Function: Within functional limits   Ice Chips     Thin Liquid Thin Liquid: Within functional limits Presentation: Cup;Straw    Nectar Thick Nectar Thick Liquid: Not tested   Honey Thick Honey Thick Liquid: Not tested   Puree Puree: Within functional limits   Solid   GO   Solid: Within functional limits       Herbie Baltimore, MA CCC-SLP [phone]  Doroteo Nickolson, Katherene Ponto 05/31/2017,1:17 PM

## 2017-05-31 NOTE — Progress Notes (Signed)
Patient ID: Kelli Velasquez, female   DOB: 05/16/1941, 76 y.o.   MRN: [id number] Patient ID: Kelli Velasquez, female   DOB: 1941/05/09, 76 y.o.   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 She has not been on bb due to wheezing and bronchitis.  Over 2 weeks prior to admission, she had increased dyspnea with exertion, she was short of breath at rest on the day of admission.  Her husband says she has been taking 20 mg of lasix daily and occasionally an extra 20 mg of lasix.   Presented to [facility] ED with respiratory distress. Family reported increased dyspnea over the last week requiring 2 liters oxygen at home. In ED she received solumedrol and duonebs. Placed on Bipap but continue decline requiring intubation. CXR concerning for interstitial edema and bilateral effusions.  Extubated this 11/4. Got IV Lasix again yesterday, weight down by 6 lbs and negative I/Os.  Breathing better.  Some cough.   CXR with lower lobe atelectasis.   Studies:  Echo 05/26/2017 EF 35-40%  Grade I DD RV normal Echo 2017 EF 45-50% with WMA Myoview 2017 No significant ischemia EF 48%.  [place] 2011- DES LAD and RCA.   Objective:   Weight Range: 237 lb 3.4 oz (107.6 kg) Body mass index is 40.72 kg/m.   Vital Signs:   Temp:  [97.2 F (36.2 C)-98.6 F (37 C)] 98.1 F (36.7 C) (11/05 0813) Pulse Rate:  [62-128] 89 (11/05 0800) Resp:  [18-30] 27 (11/05 0800) BP: (101-169)/(62-110) 131/68 (11/05 0800) SpO2:  [85 %-97 %] 93 % (11/05 0800) FiO2 (%):  [40 %] 40 % (11/04 1114) Weight:  [237 lb 3.4 oz (107.6 kg)] 237 lb 3.4 oz (107.6 kg) (11/05 0300) Last BM Date: 05/30/17  Weight change: Filed Weights   05/29/17 0417 05/30/17 0400 05/31/17 0300  Weight: 246 lb 0.5 oz (111.6 kg) 243 lb 6.2 oz (110.4 kg) 237 lb 3.4 oz (107.6 kg)     Intake/Output:   Intake/Output Summary (Last 24 hours) at 05/31/2017 0833 Last data filed at 05/31/2017 0800 Gross per 24 hour  Intake 744 ml  Output 3350 ml  Net -2606 ml      Physical Exam    General: NAD Neck: Thick, JVP looks 8-9 cm, no thyromegaly or thyroid nodule.  Lungs: Dependent crackles.  CV: Nondisplaced PMI.  Heart regular S1/S2, no S3/S4, no murmur.  No peripheral edema.   Abdomen: Soft, nontender, no hepatosplenomegaly, no distention.  Skin: Intact without lesions or rashes.  Neurologic: Alert and oriented x 3.  Psych: Normal affect. Extremities: No clubbing or cyanosis.  HEENT: Normal.    Telemetry   NSR 80s with PACs (personally reviewed).    Labs    CBC Recent Labs    05/30/17 0511 05/31/17 0311  WBC 24.0* 24.4*  HGB 12.9 15.1*  HCT 43.3 49.4*  MCV 98.0 100.8*  PLT 170 220   Basic Metabolic Panel Recent Labs    05/29/17 0659  05/30/17 1507 05/31/17 0311  NA 141   < > 146* 147*  K 2.8*   < > 4.0 4.6  CL 84*   < > 98* 101  CO2 42*   < > 35* 35*  GLUCOSE 232*   < > 215* 170*  BUN 53*   < >  77* 77*  CREATININE 1.58*   < > 1.38* 1.31*  CALCIUM 9.0   < > 9.4 9.7  MG 2.5*  --  2.5* 2.5*  PHOS 6.6*  --   --  4.8*   < > = values in this interval not displayed.   Liver Function Tests No results for input(s): AST, ALT, ALKPHOS, BILITOT, PROT, ALBUMIN in the last 72 hours. No results for input(s): LIPASE, AMYLASE in the last 72 hours. Cardiac Enzymes Recent Labs    05/30/17 0511 05/30/17 1025 05/30/17 1507  TROPONINI 0.06* 0.08* 0.22*    BNP: BNP (last 3 results) Recent Labs    05/26/17 0610  BNP 760.9*    ProBNP (last 3 results) Recent Labs    01/11/17 1053  PROBNP 264.0*     D-Dimer No results for input(s): DDIMER in the last 72 hours. Hemoglobin A1C No results for input(s): HGBA1C in the last 72 hours. Fasting Lipid Panel Recent Labs    05/29/17 0659  TRIG 243*   Thyroid Function Tests No results for  input(s): TSH, T4TOTAL, T3FREE, THYROIDAB in the last 72 hours.  Invalid input(s): FREET3  Other results:   Imaging    Dg Chest Port 1 View  Result Date: 05/31/2017 CLINICAL DATA:  CHF.  Shortness of breath EXAM: PORTABLE CHEST 1 VIEW COMPARISON:  Yesterday FINDINGS: Low volume chest with hazy opacities at the bases. There is cardiomegaly that is prominent. Vascular pedicle widening. No visible Kerley lines. No pneumothorax. IMPRESSION: Lower volumes after extubation. Extensive lower lobe atelectasis or consolidation. Electronically Signed   By: Monte Fantasia M.D.   On: 05/31/2017 07:43     Medications:     Scheduled Medications: . acetaZOLAMIDE  250 mg Oral Once  . amLODipine  10 mg Per Tube Daily  . aspirin  81 mg Per Tube Daily  . chlorhexidine gluconate (MEDLINE KIT)  15 mL Mouth Rinse BID  . ezetimibe  10 mg Per Tube QHS  . furosemide  40 mg Intravenous BID  . heparin  5,000 Units Subcutaneous Q8H  . insulin aspart  0-20 Units Subcutaneous Q4H  . mouth rinse  15 mL Mouth Rinse QID  . methylPREDNISolone (SOLU-MEDROL) injection  20 mg Intravenous Q12H  . montelukast  10 mg Per Tube QHS  . pantoprazole sodium  40 mg Per Tube Daily  . potassium chloride  20 mEq Oral Daily  . potassium chloride  60 mEq Oral Once  . spironolactone  25 mg Oral BID    Infusions: . 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  Admitted with acute/chronic respiratory failure  Assessment/Plan   1. Acute/chronic systolic CHF: Probably ischemic cardiomyopathy given prior history.  Echo this admission with EF down to 35-40% range, around 50% in past.  She was admitted with worsening dyspnea, elevated BNP, CXR with pulmonary edema. PCT not elevated.  Has history of BOOP.   Volume status improved, extubated now.  Exam difficult but probably mild JVD.  Diuresed well again yesterday.  - Will start Lasix 40 mg po bid today, can get 1 more dose of acetazolamide.  - Off losartan due to worsening renal function  - Continue spironolactone 25 mg daily.  - She has not tolerated beta blockers in the past  due to wheezing, would consider starting low dose of beta-1 specific bisoprolol when respiratory status improved. Would not start yet  - With fall in EF, would plan left/right heart cath once she is diuresed and stabilized, likely later this week when creatinine stabilizes.   2. Acute hypoxemic respiratory failure: Likely multifactorial due to pulmonary edema, effusions, BOOP and OHS.   She is getting IV Solumedrol given BOOP with ?component of exacerbation.  At baseline, she is on 2L home oxygen.  Extubated 11/4.  - Improved with diuresis.  - Steroids per primarily service.  3. CAD: h/o DES to LAD and RCA in 2011.  No chest pain but admitted with severe dyspnea/volume overload and fall in EF on echo.  Troponin mildly elevated but no trend. Suspect most likely demand ischemia from volume overload.  - As above.  Given fall in EF, would plan cath when creatinine stabilizes [facility]). 4. Bradycardia: Resolved.  5. Morbid obesity  - Needs weight loss 7. AKI: BUN/creatinine stable today, would like lower for cath.   Length of Stay: [place], [place]  05/31/2017, 8:33 AM  Advanced Heart Failure Team Pager [phone] (M-F; 7a - 4p)  Please contact [facility] for night-coverage after hours (4p -7a ) and weekends on [email or web address]

## 2017-05-31 NOTE — Progress Notes (Signed)
PULMONARY / CRITICAL CARE MEDICINE   Name: ROYA GIESELMAN MRN: [id number] DOB: 15-Jul-1941    ADMISSION DATE:  05/26/2017  REFERRING MD:  EDP  CHIEF COMPLAINT:  Respiratory failure   HISTORY OF PRESENT ILLNESS:   76yo female former smoker (quit 1984) with hx CAD, HTN, CHF, BOOP followed by Dr. Melvyn Novas on chronic prednisone presented 10/31 with progressive SOB, cough, wheezing, BLE edema.  She had significant respiratory distress in ER and failed trial bipap requiring intubation.  Initial w/u revealed BNP >700, WBC 21.4 (chronic steroids), hypercarbic respiratory failure with pH 7.211, PCO2 99.4 prior to intubation.  PCCM called for ICU admission and management.   Per husband she has been more SOB over last 1-2 weeks.  Worsening orthopnea.    SUBJECTIVE:   Patient awake and alert, confirms home O2 is 2L [place]. Denies feeling SOB.   VITAL SIGNS: BP 128/76 (BP Location: Left Wrist)   Pulse 69   Temp 98.3 F (36.8 C) (Oral)   Resp (!) 22   Ht 5\' 4"  (1.626 m)   Wt 237 lb 3.4 oz (107.6 kg)   LMP 07/27/1980   SpO2 94%   BMI 40.72 kg/m   HEMODYNAMICS:    VENTILATOR SETTINGS: 5L HFNC since 1400 11/4  INTAKE / OUTPUT: I/O last 3 completed shifts: In: 1108 [P.O.:120; I.V.:458; NG/GT:230; IV Piggyback:300] Out: 7062 [Urine:5135]  PHYSICAL EXAMINATION:  General:  Obese female, NAD with HFNC Neuro:  AOx 3.  HEENT: MMM, No LAD, thick neck, oral thrush Cardiovascular:  s1s2 rrr, no RMG Lungs:  Mildly decreased BS in bilateral bases, easy WOB.  Abdomen:  Round, soft, obese, BS +, non-tender Musculoskeletal:  Warm and dry, scant BLE  LABS:  BMET Recent Labs  Lab 05/30/17 0511 05/30/17 1507 05/31/17 0311  NA 143 146* 147*  K 2.8* 4.0 4.6  CL 93* 98* 101  CO2 35* 35* 35*  BUN 79* 77* 77*  CREATININE 1.39* 1.38* 1.31*  GLUCOSE 228* 215* 170*    Electrolytes Recent Labs  Lab 05/28/17 0243  05/29/17 0659  05/30/17 0511 05/30/17 1507 05/31/17 0311  CALCIUM 9.6   < > 9.0    < > 9.2 9.4 9.7  MG 2.3   < > 2.5*  --   --  2.5* 2.5*  PHOS 4.2  --  6.6*  --   --   --  4.8*   < > = values in this interval not displayed.    CBC Recent Labs  Lab 05/29/17 0659 05/30/17 0511 05/31/17 0311  WBC 25.2* 24.0* 24.4*  HGB 13.1 12.9 15.1*  HCT 42.1 43.3 49.4*  PLT 151 170 200    Coag's No results for input(s): APTT, INR in the last 168 hours.  Sepsis Markers Recent Labs  Lab 05/26/17 1124  PROCALCITON <0.10    ABG Recent Labs  Lab 05/28/17 0443 05/29/17 0342 05/30/17 0330  PHART 7.539* 7.495* 7.477*  PCO2ART 48.9* 59.3* 54.8*  PO2ART 64.6* 93.6 69.1*    Liver Enzymes No results for input(s): AST, ALT, ALKPHOS, BILITOT, ALBUMIN in the last 168 hours.  Cardiac Enzymes Recent Labs  Lab 05/30/17 0511 05/30/17 1025 05/30/17 1507  TROPONINI 0.06* 0.08* 0.22*    Glucose Recent Labs  Lab 05/30/17 0724 05/30/17 1200 05/30/17 1623 05/30/17 1921 05/31/17 0046 05/31/17 0444  GLUCAP 194* 187* 191* 161* 169* 187*    Imaging Dg Chest Port 1 View  Result Date: 05/31/2017 CLINICAL DATA:  CHF.  Shortness of breath EXAM: PORTABLE CHEST 1  VIEW COMPARISON:  Yesterday FINDINGS: Low volume chest with hazy opacities at the bases. There is cardiomegaly that is prominent. Vascular pedicle widening. No visible Kerley lines. No pneumothorax. IMPRESSION: Lower volumes after extubation. Extensive lower lobe atelectasis or consolidation. Electronically Signed   By: Monte Fantasia M.D.   On: 05/31/2017 07:43    STUDIES:  Echo 10/31>>> EF 35-40%, diffuse hypokinesis  CULTURES: BC x 2 10/31>>> No growth  ANTIBIOTICS: Diflucan ( Oral thrush) 11/3>>    SIGNIFICANT EVENTS: Admit 05/26/2017  LINES/TUBES: ETT 10/31>>>11/4   DISCUSSION: 76yo female with hx BOOP, CHF, HTN, probable OHS presenting with acute on chronic respiratory failure likely r/t CHF.   ASSESSMENT / PLAN:  PULMONARY Acute on chronic hypercarbic respiratory failure, Pulmonary edema. Hx  BOOP - on chronic steroids. Extubated 11/4 to HFNC. Of note, on 2L [place] at home.  P:   - Titrate oxygen for sats > 92% - Continue home steroids  - May reintubate one more time per family's wishes but if fails again then full DNR and comfort - Ambulate - SLP  CARDIOVASCULAR Acute on chronic CHF, Hx HTN. Net Negative>> 14 L on 11/5. Echo with EF 35-40% and diffuse hypokinesis. Lasix gtt off 11/3 for arrhythmias and hypokalemia. Wt down 25 lbs since admission.  P:  - HF team consulted, appreciate recs: consider cath once stabilized, spiro increased to 25mg  QD - Hold home norvasc for now  - BP control - spiro and losartan as above  RENAL Serum creatinine and BUN increase. Hypokalemia>> repleted 11/4. Hyperphos. Net Neg 14L since admit.  P:  - Avoid nephro toxic meds  - Monitor Scr/ BUN  - Monitor UO - Monitor mag and phos - Replete electrolytes as needed  GASTROINTESTINAL GERD  P:   - PPI  - NPO for now   HEMATOLOGIC leukocytosis - likely r/t chronic steroids HGB stable  P:  - F/u cbc  - Sq heparin   INFECTIOUS Leukocytosis - likely r/t steroids. T Max 99.5, ?  obvious infiltrate.  PCT negative. WBC increased, ? hemoconcentration in the setting of diuresis. Oral thrush Plan:  - Monitor off ABX for now - Diflucan started 11/3 for oral thrush - Follow fever curve and WBC  ENDOCRINE Hyperglycemia   P: - CBG Q 4   - SSI   FAMILY  - Updates: husband updated at bedside 11/4 - Inter-disciplinary family meet or Palliative Care meeting due by:  Day 7    Ralene Ok, MD PGY 2, Family Medicine

## 2017-05-31 NOTE — Progress Notes (Signed)
RT note: Patient switched from salter high-flow nasal cannula and placed on regular 4L nasal cannula.  Sats currently 94%.  Will continue to monitor.

## 2017-05-31 NOTE — Evaluation (Signed)
Physical Therapy Evaluation Patient Details Name: Kelli Velasquez MRN: [id number] DOB: 04-28-1941 Today's Date: 05/31/2017   History of Present Illness  76yo female with hx BOOP, CHF, HTN, probable OHS presenting with acute on chronic respiratory failure likely r/t CHF  Clinical Impression  Orders received for PT evaluation. Patient demonstrates deficits in functional mobility as indicated below. Will benefit from continued skilled PT to address deficits and maximize function. Will see as indicated and progress as tolerated.  Ambulation deferred today due to desaturation and elevate HR with minimal activity (150s HR, saturations to mid 80s on 4 lites). Nsg aware.     Follow Up Recommendations Home health PT;Supervision for mobility/OOB;Supervision/Assistance - 24 hour    Equipment Recommendations  Rolling walker with 5" wheels(pending progress)    Recommendations for Other Services       Precautions / Restrictions Precautions Precautions: Fall Precaution Comments: watch O2 saturations and HR Restrictions Weight Bearing Restrictions: No      Mobility  Bed Mobility Overal bed mobility: Needs Assistance Bed Mobility: Rolling;Supine to Sit Rolling: Supervision   Supine to sit: Min guard     General bed mobility comments: min guard for safety, no physical assist required, increased time to perform with use of bed rail for UE support  Transfers Overall transfer level: Needs assistance Equipment used: 2 person hand held assist Transfers: Sit to/from [facility] Sit to Stand: Min assist Stand pivot transfers: +2 physical assistance;Min assist       General transfer comment: min assist for safety and stability during transition from bed to chair. Posterior instability noted, assist for controlled descent to chair  Ambulation/Gait             General Gait Details: deferred today due to desaturation and elevate HR with minimal activity (150s HR, saturations  to mid 80s on 4 lites)  Stairs            Wheelchair Mobility    Modified Rankin (Stroke Patients Only)       Balance Overall balance assessment: Needs assistance Sitting-balance support: Feet supported Sitting balance-Leahy Scale: Fair     Standing balance support: During functional activity Standing balance-Leahy Scale: Poor Standing balance comment: min assist for stability, posterior list noted                             Pertinent Vitals/Pain Pain Assessment: No/denies pain    Home Living Family/patient expects to be discharged to:: Private residence Living Arrangements: Spouse/significant other Available Help at Discharge: Family Type of Home: House Home Access: Stairs to enter   [employer] of Steps: 1 Home Layout: Two level;Able to live on main level with bedroom/bathroom Home Equipment: Shower seat      Prior Function Level of Independence: Independent               Hand Dominance   Dominant Hand: Right    Extremity/Trunk Assessment   Upper Extremity Assessment Upper Extremity Assessment: Generalized weakness    Lower Extremity Assessment Lower Extremity Assessment: Generalized weakness    Cervical / Trunk Assessment Cervical / Trunk Assessment: (increased body habitus)  Communication   Communication: (low voice volume)  Cognition Arousal/Alertness: Awake/alert Behavior During Therapy: WFL for tasks assessed/performed Overall Cognitive Status: Within Functional Limits for tasks assessed  General Comments General comments (skin integrity, edema, etc.): educated on pursed lip breathing    Exercises     Assessment/Plan    PT Assessment Patient needs continued PT services  PT Problem List Decreased strength;Decreased activity tolerance;Decreased balance;Decreased mobility;Cardiopulmonary status limiting activity;Obesity       PT Treatment  Interventions DME instruction;Gait training;Functional mobility training;Therapeutic activities;Therapeutic exercise;Balance training;Patient/family education    PT Goals (Current goals can be found in the Care Plan section)  Acute Rehab PT Goals Patient Stated Goal: to go home PT Goal Formulation: With patient Time For Goal Achievement: 06/14/17 Potential to Achieve Goals: Good    Frequency Min 3X/week   Barriers to discharge Decreased caregiver support      Co-evaluation               AM-PAC PT "6 Clicks" Daily Activity  Outcome Measure Difficulty turning over in bed (including adjusting bedclothes, sheets and blankets)?: A Little Difficulty moving from lying on back to sitting on the side of the bed? : A Little Difficulty sitting down on and standing up from a chair with arms (e.g., wheelchair, bedside commode, etc,.)?: A Lot Help needed moving to and from a bed to chair (including a wheelchair)?: A Lot Help needed walking in hospital room?: A Lot Help needed climbing 3-5 steps with a railing? : A Lot 6 Click Score: 14    End of Session Equipment Utilized During Treatment: Oxygen Activity Tolerance: Patient tolerated treatment well Patient left: in chair;with call bell/phone within reach;with chair alarm set Nurse Communication: Mobility status PT Visit Diagnosis: Unsteadiness on feet (R26.81);Difficulty in walking, not elsewhere classified (R26.2)    Time: 2633-3545 PT Time Calculation (min) (ACUTE ONLY): 22 min   Charges:   PT Evaluation $PT Eval Moderate Complexity: 1 Mod     PT G Codes:        Alben Deeds, PT DPT  Board Certified Neurologic Specialist [place] 05/31/2017, 11:41 AM

## 2017-06-01 ENCOUNTER — Encounter (HOSPITAL_COMMUNITY): Payer: Self-pay | Admitting: *Deleted

## 2017-06-01 DIAGNOSIS — E7800 Pure hypercholesterolemia, unspecified: Secondary | ICD-10-CM

## 2017-06-01 DIAGNOSIS — Z789 Other specified health status: Secondary | ICD-10-CM

## 2017-06-01 DIAGNOSIS — I10 Essential (primary) hypertension: Secondary | ICD-10-CM

## 2017-06-01 DIAGNOSIS — J8489 Other specified interstitial pulmonary diseases: Secondary | ICD-10-CM

## 2017-06-01 LAB — CBC WITH DIFFERENTIAL/PLATELET
BASOS ABS: 0 10*3/uL (ref 0.0–0.1)
BASOS PCT: 0 %
EOS PCT: 0 %
Eosinophils Absolute: 0 10*3/uL (ref 0.0–0.7)
HCT: 47.6 % — ABNORMAL HIGH (ref 36.0–46.0)
Hemoglobin: 14.2 g/dL (ref 12.0–15.0)
LYMPHS PCT: 12 %
Lymphs Abs: 2.7 10*3/uL (ref 0.7–4.0)
MCH: 30 pg (ref 26.0–34.0)
MCHC: 29.8 g/dL — ABNORMAL LOW (ref 30.0–36.0)
MCV: 100.4 fL — AB (ref 78.0–100.0)
MONO ABS: 1 10*3/uL (ref 0.1–1.0)
Monocytes Relative: 4 %
Neutro Abs: 19.3 10*3/uL — ABNORMAL HIGH (ref 1.7–7.7)
Neutrophils Relative %: 84 %
PLATELETS: 217 10*3/uL (ref 150–400)
RBC: 4.74 MIL/uL (ref 3.87–5.11)
RDW: 15.4 % (ref 11.5–15.5)
WBC: 23 10*3/uL — AB (ref 4.0–10.5)

## 2017-06-01 LAB — RENAL FUNCTION PANEL
ALBUMIN: 3.2 g/dL — AB (ref 3.5–5.0)
Anion gap: 12 (ref 5–15)
BUN: 59 mg/dL — AB (ref 6–20)
CALCIUM: 9.6 mg/dL (ref 8.9–10.3)
CO2: 36 mmol/L — ABNORMAL HIGH (ref 22–32)
CREATININE: 1.31 mg/dL — AB (ref 0.44–1.00)
Chloride: 96 mmol/L — ABNORMAL LOW (ref 101–111)
GFR, EST AFRICAN AMERICAN: 44 mL/min — AB (ref >60)
GFR, EST NON AFRICAN AMERICAN: 38 mL/min — AB (ref >60)
Glucose, Bld: 144 mg/dL — ABNORMAL HIGH (ref 65–99)
PHOSPHORUS: 3.7 mg/dL (ref 2.5–4.6)
Potassium: 3.4 mmol/L — ABNORMAL LOW (ref 3.5–5.1)
SODIUM: 144 mmol/L (ref 135–145)

## 2017-06-01 LAB — GLUCOSE, CAPILLARY
GLUCOSE-CAPILLARY: 163 mg/dL — AB (ref 65–99)
GLUCOSE-CAPILLARY: 144 mg/dL — AB (ref 65–99)
Glucose-Capillary: 206 mg/dL — ABNORMAL HIGH (ref 65–99)
Glucose-Capillary: 202 mg/dL — ABNORMAL HIGH (ref 65–99)

## 2017-06-01 LAB — MAGNESIUM: MAGNESIUM: 2.3 mg/dL (ref 1.7–2.4)

## 2017-06-01 MED ORDER — IPRATROPIUM-ALBUTEROL 0.5-2.5 (3) MG/3ML IN SOLN
3.0000 mL | Freq: Four times a day (QID) | RESPIRATORY_TRACT | Status: DC
  Administered 2017-06-01 – 2017-06-17 (×64): 3 mL via RESPIRATORY_TRACT
  Filled 2017-06-01 (×66): qty 3

## 2017-06-01 MED ORDER — POTASSIUM CHLORIDE CRYS ER 20 MEQ PO TBCR: 40.0000 meq | EXTENDED_RELEASE_TABLET | Freq: Once | ORAL | Status: DC

## 2017-06-01 MED ORDER — GUAIFENESIN ER 600 MG PO TB12
600.0000 mg | ORAL_TABLET | Freq: Two times a day (BID) | ORAL | Status: DC
  Administered 2017-06-01: 600 mg via ORAL
  Filled 2017-06-01 (×2): qty 1

## 2017-06-01 MED ORDER — SODIUM CHLORIDE 0.9% FLUSH
3.0000 mL | INTRAVENOUS | Status: DC | PRN
Start: 2017-06-01 — End: 2017-06-02

## 2017-06-01 MED ORDER — SODIUM CHLORIDE 0.9 % IV SOLN
INTRAVENOUS | Status: DC
  Administered 2017-06-05: 08:00:00 via INTRAVENOUS

## 2017-06-01 MED ORDER — SODIUM CHLORIDE 0.9 % IV SOLN: 250.0000 mL | INTRAVENOUS | Status: DC | PRN

## 2017-06-01 MED ORDER — ISOSORBIDE MONONITRATE 15 MG HALF TABLET
15.0000 mg | ORAL_TABLET | Freq: Every day | ORAL | Status: DC
  Administered 2017-06-01: 15 mg via ORAL
  Filled 2017-06-01: qty 1

## 2017-06-01 MED ORDER — HYDRALAZINE HCL 25 MG PO TABS
25.0000 mg | ORAL_TABLET | Freq: Three times a day (TID) | ORAL | Status: DC
  Administered 2017-06-01 (×2): 25 mg via ORAL
  Filled 2017-06-01 (×3): qty 1

## 2017-06-01 MED ORDER — BUDESONIDE 0.25 MG/2ML IN SUSP
0.2500 mg | Freq: Two times a day (BID) | RESPIRATORY_TRACT | Status: DC
  Administered 2017-06-01 – 2017-06-02 (×2): 0.25 mg via RESPIRATORY_TRACT
  Filled 2017-06-01 (×2): qty 2

## 2017-06-01 MED ORDER — SODIUM CHLORIDE 0.9% FLUSH
3.0000 mL | Freq: Two times a day (BID) | INTRAVENOUS | Status: DC
  Administered 2017-06-01 (×2): 3 mL via INTRAVENOUS

## 2017-06-01 NOTE — Progress Notes (Signed)
  Speech Language Pathology Treatment: Dysphagia  Patient Details Name: Kelli Velasquez MRN: [id number] DOB: 08/18/1940 Today's Date: 06/01/2017 Time: 0034-9179 SLP Time Calculation (min) (ACUTE ONLY): 14 min  Assessment / Plan / Recommendation Clinical Impression  Pt eating breakfast in a semi-reclined position with continuous cough, red face, O2 sats falling to 87% when SLP entered room. Advised pt to continue coughing and therapist increased O2 from 5 liters to 8. Coughing subsided, encouraged her to take deep breaths. Oxygen lowered to 5 and pt able to maintain sats in low 90's. Once she calmed observed with one straw sip thin with immediate cough. Notified RN of event of session recommendation for NPO until objective assessment with FEES can be completed. Plan for FEES this morning.    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 She had significant respiratory distress in ER and failed trial bipap requiring intubation. Initial w/u revealed BNP >700, WBC 21.4 (chronic steroids), hypercarbic respiratory failure with pH 7.211, PCO2 99.4 prior to intubation from 10/31 to 11/4. Per husband she has been more SOB over last 1-2 weeks. Worsening orthopnea.       SLP Plan  Other (Comment)(FEES)       Recommendations  Diet recommendations: NPO                Oral Care Recommendations: Oral care BID Follow up Recommendations: 24 hour supervision/assistance SLP Visit Diagnosis: Dysphagia, unspecified (R13.10) Plan: Other (Comment)(FEES)       GO                Houston Siren 06/01/2017, 9:37 AM  Orbie Pyo Colvin Caroli.Ed [employer] [phone]

## 2017-06-01 NOTE — Progress Notes (Signed)
Patient ID: DEEANDRA Velasquez, female   DOB: 1941-05-08, 76 y.o.   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 She has not been on bb due to wheezing and bronchitis.  Over 2 weeks prior to admission, she had increased dyspnea with exertion, she was short of breath at rest on the day of admission.  Her husband says she has been taking 20 mg of lasix daily and occasionally an extra 20 mg of lasix.   Presented to [facility] ED with respiratory distress. Family reported increased dyspnea over the last week requiring 2 liters oxygen at home. In ED she received solumedrol and duonebs. Placed on Bipap but continue decline requiring intubation. CXR concerning for interstitial edema and bilateral effusions.  Extubated 11/4.   Sitting in chair. Hard of hearing. Mouth green from oral swabs (appears blue in dark, but under light is clearly green). Denies SOB currently or CP.   CXR with lower lobe atelectasis 05/31/17.  Negative 900 cc. No weight today.   Studies:  Echo 05/26/2017 EF 35-40%  Grade I DD RV normal Echo 2017 EF 45-50% with WMA Myoview 2017 No significant ischemia EF 48%.  Stigler 2011- DES LAD and RCA.   Objective:   Weight Range: 247 lb 12.8 oz (112.4 kg) Body mass index is 42.53 kg/m.   Vital Signs:   Temp:  [98 F (36.7 C)-99 F (37.2 C)] 98 F (36.7 C) (11/06 0754) Pulse Rate:  [75-103] 80 (11/06 0754) Resp:  [17-25] 23 (11/06 0754) BP: (117-188)/(57-163) 165/69 (11/06 0700) SpO2:  [89 %-95 %] 90 % (11/06 0754) Weight:  [247 lb 12.8 oz (112.4 kg)] 247 lb 12.8 oz (112.4 kg) (11/05 2306) Last BM Date: 05/31/17  Weight change: Filed Weights   05/30/17 0400 05/31/17 0300 05/31/17 2306  Weight: 243 lb 6.2 oz (110.4 kg) 237 lb 3.4 oz (107.6 kg) 247 lb 12.8 oz (112.4 kg)   Intake/Output:   Intake/Output Summary (Last 24  hours) at 06/01/2017 1059 Last data filed at 06/01/2017 0516 Gross per 24 hour  Intake 740 ml  Output 1610 ml  Net -870 ml    Physical Exam   General: Elderly and chronically ill appearing.  HEENT: Normal. O2 via [place]. Neck: Supple. JVP difficult but appears 6-7 cm. Carotids 2+ bilat; no bruits. No thyromegaly or nodule noted. Cor: PMI nondisplaced. RRR, No M/G/R noted Lungs: Diminished throughout. With basilar crackles and high pitched R lung sounds.  Abdomen: Soft, non-tender, non-distended, no HSM. No bruits or masses. +BS  Extremities: No cyanosis, clubbing, or rash. Trace ankle edema.  Neuro: Alert & orientedx3, cranial nerves grossly intact. moves all 4 extremities w/o difficulty. Affect flat.  Telemetry   NSR 80s with PACs, Personally reviewed.   Labs    CBC Recent Labs    05/31/17 0311 06/01/17 0213  WBC 24.4* 23.0*  NEUTROABS  --  19.3*  HGB 15.1* 14.2  HCT 49.4* 47.6*  MCV 100.8* 100.4*  PLT 200 092   Basic Metabolic Panel Recent Labs    05/31/17 0311 06/01/17 0213  NA 147* 144  K 4.6 3.4*  CL 101 96*  CO2 35* 36*  GLUCOSE 170* 144*  BUN 77* 59*  CREATININE 1.31* 1.31*  CALCIUM 9.7 9.6  MG 2.5* 2.3  PHOS 4.8* 3.7  Liver Function Tests Recent Labs    06/01/17 0213  ALBUMIN 3.2*   No results for input(s): LIPASE, AMYLASE in the last 72 hours. Cardiac Enzymes Recent Labs    05/30/17 0511 05/30/17 1025 05/30/17 1507  TROPONINI 0.06* 0.08* 0.22*   BNP: BNP (last 3 results) Recent Labs    05/26/17 0610  BNP 760.9*   ProBNP (last 3 results) Recent Labs    01/11/17 1053  PROBNP 264.0*   D-Dimer No results for input(s): DDIMER in the last 72 hours. Hemoglobin A1C No results for input(s): HGBA1C in the last 72 hours. Fasting Lipid Panel No results for input(s): CHOL, HDL, LDLCALC, TRIG, CHOLHDL, LDLDIRECT in the last 72 hours. Thyroid Function Tests No results for input(s): TSH, T4TOTAL, T3FREE, THYROIDAB in the last 72  hours.  Invalid input(s): FREET3  Other results:  Imaging   No results found.  Medications:     Scheduled Medications: . amLODipine  10 mg Oral Daily  . aspirin EC  81 mg Oral Daily  . budesonide (PULMICORT) nebulizer solution  0.25 mg Nebulization BID  . chlorhexidine gluconate (MEDLINE KIT)  15 mL Mouth Rinse BID  . ezetimibe  10 mg Oral QHS  . furosemide  40 mg Oral BID  . guaiFENesin  600 mg Oral BID  . heparin  5,000 Units Subcutaneous Q8H  . insulin aspart  0-15 Units Subcutaneous TID WC  . insulin aspart  0-5 Units Subcutaneous QHS  . ipratropium-albuterol  3 mL Nebulization Q6H  . mouth rinse  15 mL Mouth Rinse QID  . montelukast  10 mg Oral QHS  . pantoprazole  40 mg Oral Daily  . potassium chloride  20 mEq Oral Daily  . potassium chloride  60 mEq Oral Once  . potassium chloride  40 mEq Oral Once  . predniSONE  20 mg Oral Q breakfast  . spironolactone  25 mg Oral BID    Infusions: . 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  Admitted with acute/chronic respiratory failure  Assessment/Plan   1. Acute/chronic systolic CHF: Probably ischemic cardiomyopathy given prior history.  Echo this admission with EF down to 35-40% range, around 50% in past.  She was admitted with worsening dyspnea, elevated BNP, CXR with pulmonary edema. PCT not elevated.  Has history of BOOP.  Volume status improved, extubated now.  Exam difficult but probably mild JVD.  Diuresed well again yesterday.  - Continue lasix 40 mg po BID.  - Off losartan due to worsening renal function. - Continue spironolactone 25 mg daily.  - She has not tolerated beta blockers in the past due to wheezing, would consider starting low dose of beta-1 specific bisoprolol when respiratory status improved.  Would not start yet. - With fall in EF, would plan left/right heart cath once she is diuresed and stabilized, likely later this week when creatinine stabilizes.  BUN improved. Will discuss cath timing with Dr. Aundra Dubin.  2. Acute hypoxemic respiratory failure: Likely multifactorial due to pulmonary edema, effusions, BOOP and OHS.   She was on IV Solumedrol, now prednisone given BOOP with ?component of exacerbation.  At baseline, she is on 2L home oxygen.  Extubated 11/4.  - Improved with diuresis.  - Steroids per primarily service.  - Duonebs.  -  No change.  3. CAD: h/o DES to LAD and RCA in 2011.  No chest pain but admitted with severe dyspnea/volume overload and fall in EF on echo.  Troponin mildly elevated but no trend. Suspect most likely demand ischemia from volume overload.  - As above. Given fall in EF, would plan cath when creatinine stabilizes [facility]). Slowly improving. Will discuss plan with MD.  4. Bradycardia:  - Resolved.  5. Morbid obesity  - Needs weight loss.  6. AKI:  - Creatinine stable. BUN improved.  7. Hypokalemia - Supp ordered.  8. HTN - Elevated into 150-160s today.  - Will start Hydralazine 25 mg TID and imdur 15 mg daily. (Watching renal function closely, so no ARB for now)  Stable but tenuous. Continue po lasix. Will discuss timing of cath with MD.   Length of Stay: [place], PA-C  06/01/2017, 10:59 AM  Advanced Heart Failure Team Pager [phone] (M-F; 7a - 4p)  Please contact [facility] for night-coverage after hours (4p -7a ) and weekends on [email or web address]  Patient seen with PA, agree with the above note.    She had speech/swallow evaluation today, has swollen upper airways and was made NPO with ice chips.    She is now on po Lasix though volume status difficult to define with very thick neck. Hopefully can crush Lasix in apple sauce to give, but if not may need to go back to IV.   - We have tentatively planned RHC/LHC tomorrow to help  define volume but will need to reassess in am.   BP is elevated.  If she cannot get her anti-hypertensives crushed in apple sauce will need IV BP control while NPO.   On lung exam, she has extensive probably upper airways sounds that may be due to upper airways swelling post-extubation.  She is on prednisone given history of BOOP and getting Duonebs.   BUN/creatinine trending down, as above will consider coronary angiography tomorrow along with RHC if she remains stable (given fall in EF).  May need to delay, we will see.   Loralie Champagne 06/01/2017 12:01 PM

## 2017-06-01 NOTE — Progress Notes (Addendum)
PROGRESS NOTE    Kelli Velasquez   DJT:[id number]  DOB: 09-Nov-1940  DOA: 05/26/2017 PCP: Abner Greenspan, MD   Brief Narrative:  Kelli Velasquez is a 76 y/o with h/o recurrent acute bronchitis suspected to be related to BOOP on chronic steroids with resultant leukocytosis (quit 38 y smoking in 1984), CAD s/p DES stents 2011 (LAD/RCA), systolic and grade 1dCHF, morbid obesity, HTN, HLD, b/l knee replacements.   She presented with dyspnea, wheezing, productive cough for about 1-2 wks. Per husband, she had been compliant with Lasix at home at times taking an extra 20 mg.   Resp distress did not improve with Solumedrol & Nebs given by EMS or BiPAP in the ER and she was intubated in the ER by the ED physician. Found to have acute CHF  CXR > b/l atelectasis - interstitial and bronchial thickening ? CHF   ABG>> pH 7.211, PCO2 99.4  BNP 760 Procalcitonin < 0.1 ECHO: EF 35-40% (previously 45-50%) diffuse hypokinesis. CHF team consulted on 11/1. Started on Lasix infusion, Metolazone & Spironolactone.  11/5 Extubated 11/5 CXR- extensive lower lobe atelectasis or consolidation and low lung volumes  Subjective: No complaints. Sounds quite congested but states she is feeling better. Seems to be a poor historian.  No other complaints.   Assessment & Plan:   Principal Problem:    Acute on chronic respiratory failure with hypercapnia  - currently on 5 L O2- she states she is usually on 2 L (A)  Acute on chronic systolic congestive heart failure  -appreciate management per cardiology- plan for cath soon  (B) BOOP - follows with Dr Melvyn Novas - PCT neg - cont steroids - very congested on exam- mildy short of breath- add chest PT, Mucinex, Flutter valve and Nebs today - also on Singulair  Active Problems:   Essential hypertension - BP quite high this AM - Norvas, Aldactone & Lasix - Cardiology has added Hydralazine & Imdur today    Hyperlipidemia - intolerant of statins- cont Zetia    Severe  obesity (BMI >= 40) Body mass index is 42.53 kg/m.    CAD, NATIVE VESSEL - s/p DES stents as mentioned in H and P - plan for repeat cath due to new drop in ER   Thrush - Diflucan 11/3  Bradycardia due to Propofol  Nutrition - NPO for now per SLP- will allow to take meds- per RN she is not having trouble with this  DVT prophylaxis: Heparin Code Status: Full code Family Communication:  Disposition Plan: follow in SDU today Consultants:   CHF team Procedures:   Intubation  2 D ECHO Antimicrobials:  Anti-infectives (From admission, onward)   Start     Dose/Rate Route Frequency Ordered Stop   05/28/17 2000  fluconazole (DIFLUCAN) IVPB 100 mg     100 mg 50 mL/hr over 60 Minutes Intravenous  Once 05/28/17 1933 05/28/17 2119       Objective: Vitals:   06/01/17 0754 06/01/17 0900 06/01/17 1000 06/01/17 1100  BP:  (!) 148/81 128/77 (!) 149/64  Pulse: 80 96 (!) 101 93  Resp: (!) 23 (!) 24 18 (!) 23  Temp: 98 F (36.7 C)     TempSrc: Oral     SpO2: 90%     Weight:      Height:        Intake/Output Summary (Last 24 hours) at 06/01/2017 1310 Last data filed at 06/01/2017 0750 Gross per 24 hour  Intake 560 ml  Output 1935 ml  Net -1375 ml   Filed Weights   05/30/17 0400 05/31/17 0300 05/31/17 2306  Weight: 110.4 kg (243 lb 6.2 oz) 107.6 kg (237 lb 3.4 oz) 112.4 kg (247 lb 12.8 oz)    Examination: General exam: Appears comfortable  HEENT: PERRLA, oral mucosa moist, no sclera icterus or thrush Respiratory system: congested, increased respiratory effort- RR ~ 25-30 Cardiovascular system: S1 & S2 heard, RRR.  No murmurs  Gastrointestinal system: Abdomen soft, non-tender, nondistended. Normal bowel sound. No organomegaly Central nervous system: Alert and oriented. No focal neurological deficits. Extremities: No cyanosis, clubbing or edema Skin: No rashes or ulcers Psychiatry:  Mood & affect appropriate but a bit slow to respond   Data Reviewed: I have personally  reviewed following labs and imaging studies  CBC: Recent Labs  Lab 05/26/17 0610  05/28/17 0243 05/29/17 0659 05/30/17 0511 05/31/17 0311 06/01/17 0213  WBC 21.4*   < > 22.5* 25.2* 24.0* 24.4* 23.0*  NEUTROABS 16.7*  --   --   --   --   --  19.3*  HGB 13.8   < > 13.3 13.1 12.9 15.1* 14.2  HCT 47.8*   < > 44.0 42.1 43.3 49.4* 47.6*  MCV 103.7*   < > 98.0 95.9 98.0 100.8* 100.4*  PLT 258   < > 185 151 170 200 217   < > = values in this interval not displayed.   Basic Metabolic Panel: Recent Labs  Lab 05/28/17 0243  05/28/17 2040 05/29/17 0659 05/29/17 1436 05/30/17 0511 05/30/17 1507 05/31/17 0311 06/01/17 0213  NA 141   < >  --  141 142 143 146* 147* 144  K 4.5   < >  --  2.8* 3.4* 2.8* 4.0 4.6 3.4*  CL 90*   < >  --  84* 92* 93* 98* 101 96*  CO2 38*   < >  --  42* 36* 35* 35* 35* 36*  GLUCOSE 183*   < >  --  232* 209* 228* 215* 170* 144*  BUN 29*   < >  --  53* 62* 79* 77* 77* 59*  CREATININE 1.14*   < >  --  1.58* 1.50* 1.39* 1.38* 1.31* 1.31*  CALCIUM 9.6   < >  --  9.0 8.9 9.2 9.4 9.7 9.6  MG 2.3  --  2.1 2.5*  --   --  2.5* 2.5* 2.3  PHOS 4.2  --   --  6.6*  --   --   --  4.8* 3.7   < > = values in this interval not displayed.   GFR: Estimated Creatinine Clearance: 44.2 mL/min (A) (by C-G formula based on SCr of 1.31 mg/dL (H)). Liver Function Tests: Recent Labs  Lab 06/01/17 0213  ALBUMIN 3.2*   No results for input(s): LIPASE, AMYLASE in the last 168 hours. No results for input(s): AMMONIA in the last 168 hours. Coagulation Profile: No results for input(s): INR, PROTIME in the last 168 hours. Cardiac Enzymes: Recent Labs  Lab 05/26/17 1848 05/27/17 0038 05/30/17 0511 05/30/17 1025 05/30/17 1507  TROPONINI 0.04* 0.04* 0.06* 0.08* 0.22*   BNP (last 3 results) Recent Labs    01/11/17 1053  PROBNP 264.0*   HbA1C: No results for input(s): HGBA1C in the last 72 hours. CBG: Recent Labs  Lab 05/31/17 1608 05/31/17 1927 05/31/17 2132  06/01/17 0615 06/01/17 1132  GLUCAP 250* 202* 173* 163* 206*   Lipid Profile: No results for input(s): CHOL, HDL, LDLCALC, TRIG, CHOLHDL, LDLDIRECT  in the last 72 hours. Thyroid Function Tests: No results for input(s): TSH, T4TOTAL, FREET4, T3FREE, THYROIDAB in the last 72 hours. Anemia Panel: No results for input(s): VITAMINB12, FOLATE, FERRITIN, TIBC, IRON, RETICCTPCT in the last 72 hours. Urine analysis:    Component Value Date/Time   COLORURINE YELLOW 07/30/2009 1435   APPEARANCEUR CLEAR 07/30/2009 1435   LABSPEC 1.015 07/30/2009 1435   PHURINE 6.0 07/30/2009 1435   GLUCOSEU NEGATIVE 07/30/2009 1435   HGBUR NEGATIVE 07/30/2009 1435   BILIRUBINUR neg. 06/28/2013 1041   KETONESUR NEGATIVE 07/30/2009 1435   PROTEINUR neg. 06/28/2013 1041   PROTEINUR NEGATIVE 07/30/2009 1435   UROBILINOGEN 0.2 06/28/2013 1041   UROBILINOGEN 0.2 07/30/2009 1435   NITRITE neg. 06/28/2013 1041   NITRITE NEGATIVE 07/30/2009 1435   LEUKOCYTESUR Negative 06/28/2013 1041   Sepsis Labs: _0 (procalcitonin:4,lacticidven:4) ) Recent Results (from the past 240 hour(s))  MRSA PCR Screening     Status: None   Collection Time: 05/26/17  3:28 PM  Result Value Ref Range Status   MRSA by PCR NEGATIVE NEGATIVE Final    Comment:        The GeneXpert MRSA Assay (FDA approved for NASAL specimens only), is one component of a comprehensive MRSA colonization surveillance program. It is not intended to diagnose MRSA infection nor to guide or monitor treatment for MRSA infections.          Radiology Studies: Dg Chest Port 1 View  Result Date: 05/31/2017 CLINICAL DATA:  CHF.  Shortness of breath EXAM: PORTABLE CHEST 1 VIEW COMPARISON:  Yesterday FINDINGS: Low volume chest with hazy opacities at the bases. There is cardiomegaly that is prominent. Vascular pedicle widening. No visible Kerley lines. No pneumothorax. IMPRESSION: Lower volumes after extubation. Extensive lower lobe atelectasis or  consolidation. Electronically Signed   By: Monte Fantasia M.D.   On: 05/31/2017 07:43      Scheduled Meds: . amLODipine  10 mg Oral Daily  . aspirin EC  81 mg Oral Daily  . budesonide (PULMICORT) nebulizer solution  0.25 mg Nebulization BID  . chlorhexidine gluconate (MEDLINE KIT)  15 mL Mouth Rinse BID  . ezetimibe  10 mg Oral QHS  . furosemide  40 mg Oral BID  . guaiFENesin  600 mg Oral BID  . heparin  5,000 Units Subcutaneous Q8H  . hydrALAZINE  25 mg Oral Q8H  . insulin aspart  0-15 Units Subcutaneous TID WC  . insulin aspart  0-5 Units Subcutaneous QHS  . ipratropium-albuterol  3 mL Nebulization Q6H  . isosorbide mononitrate  15 mg Oral Daily  . mouth rinse  15 mL Mouth Rinse QID  . montelukast  10 mg Oral QHS  . pantoprazole  40 mg Oral Daily  . potassium chloride  20 mEq Oral Daily  . potassium chloride  60 mEq Oral Once  . potassium chloride  40 mEq Oral Once  . predniSONE  20 mg Oral Q breakfast  . sodium chloride flush  3 mL Intravenous Q12H  . spironolactone  25 mg Oral BID   Continuous Infusions: . sodium chloride 250 mL (05/30/17 1700)  . sodium chloride    . sodium chloride       LOS: 6 days    Time spent in minutes: 40    Debbe Odea, MD [facility] Pager: [email or web address] Password [email or web address] 06/01/2017, 1:10 PM

## 2017-06-01 NOTE — Progress Notes (Signed)
Physical Therapy Treatment Patient Details Name: Kelli Velasquez MRN: [id number] DOB: 06-Apr-1941 Today's Date: 06/01/2017    History of Present Illness 76yo female with hx BOOP, CHF, HTN, probable OHS presenting with acute on chronic respiratory failure likely r/t CHF    PT Comments    Pt very impulsive and hard of hearing during today's session. Constant cueing asking pt to wait during set up for ambulation. Ambulation to restroom using RW; O2 sats monitored 79%-94% on 4 liters, HR 90-117bpm during all activities today. Pt was in restroom for ~10 min needed assistance to clean up. SLP and rehab tech entered at end of session to set up for swallowing test. Pt will continue to benefit from PT. Continue with gait next session.  Follow Up Recommendations  Home health PT;Supervision for mobility/OOB;Supervision/Assistance - 24 hour     Equipment Recommendations  Rolling walker with 5" wheels    Recommendations for Other Services       Precautions / Restrictions Precautions Precautions: Fall Precaution Comments: watch O2 saturations and HR Restrictions Weight Bearing Restrictions: No    Mobility  Bed Mobility Overal bed mobility: Needs Assistance       Supine to sit: Min guard;HOB elevated     General bed mobility comments: min guard for safety, sitting pivot no physical assist required, increased time to perform with use of bed rail for UE support  Transfers Overall transfer level: Needs assistance Equipment used: Rolling walker (2 wheeled);2 person hand held assist Transfers: Sit to/from Stand Sit to Stand: Min assist;+2 safety/equipment;Mod assist         General transfer comment: min assist for safety and stability during sit to stand from bed. VC needed for proper hand placement ascending/descending, power-up and erect posture.   Ambulation/Gait Ambulation/Gait assistance: +2 physical assistance;Mod assist;+2 safety/equipment Ambulation Distance (Feet): (to restroom  and back to recliner) Assistive device: Rolling walker (2 wheeled)       General Gait Details: Pt rushed to restroom. Very unsteady. Verbal and tactile cues for safe mobility with walker. Assist with maneuvering walker in room and around door frame to restroom pt on 4 liters of O2 during ambulation.    Stairs            Wheelchair Mobility    Modified Rankin (Stroke Patients Only)       Balance Overall balance assessment: Needs assistance Sitting-balance support: Feet supported Sitting balance-Leahy Scale: Fair Sitting balance - Comments: Pt leaning to the left. Cues for erect posture when sitting EOB   Standing balance support: During functional activity Standing balance-Leahy Scale: Poor Standing balance comment: min assist-mod assist for stability and safety pt impulsive.                            Cognition Arousal/Alertness: Awake/alert Behavior During Therapy: WFL for tasks assessed/performed Overall Cognitive Status: Within Functional Limits for tasks assessed                                        Exercises      General Comments        Pertinent Vitals/Pain Faces Pain Scale: Hurts even more    Home Living                      Prior Function  PT Goals (current goals can now be found in the care plan section) Acute Rehab PT Goals Patient Stated Goal: not discussed Progress towards PT goals: Progressing toward goals    Frequency    Min 3X/week      PT Plan      Co-evaluation              AM-PAC PT "6 Clicks" Daily Activity  Outcome Measure  Difficulty turning over in bed (including adjusting bedclothes, sheets and blankets)?: A Little Difficulty moving from lying on back to sitting on the side of the bed? : A Little Difficulty sitting down on and standing up from a chair with arms (e.g., wheelchair, bedside commode, etc,.)?: A Lot Help needed moving to and from a bed to chair  (including a wheelchair)?: A Lot Help needed walking in hospital room?: A Lot Help needed climbing 3-5 steps with a railing? : A Lot 6 Click Score: 14    End of Session Equipment Utilized During Treatment: Oxygen;Gait belt Activity Tolerance: Patient tolerated treatment well(very impulsive throughout treatment) Patient left: in chair;with call bell/phone within reach;Other (comment)(with Rehab tech setting up for swallow test) Nurse Communication: Mobility status PT Visit Diagnosis: Unsteadiness on feet (R26.81);Difficulty in walking, not elsewhere classified (R26.2)     Time: 0932-1000 PT Time Calculation (min) (ACUTE ONLY): 28 min  Charges:  $Gait Training: 8-22 mins $Therapeutic Activity: 8-22 mins                    G Codes:  Functional Assessment Tool Used: AM-PAC 6 Clicks Basic Mobility    Fransisca Connors, SPTA    Fransisca Connors 06/01/2017, 12:41 PM

## 2017-06-01 NOTE — Care Management Important Message (Signed)
Important Message  Patient Details  Name: Kelli Velasquez MRN: [id number] Date of Birth: 1940-09-27   Medicare Important Message Given:  Yes    Shantoria Ellwood Abena 06/01/2017, 10:05 AM

## 2017-06-01 NOTE — Procedures (Signed)
Objective Swallowing Evaluation: Type of Study: FEES-Fiberoptic Endoscopic Evaluation of Swallow   Patient Details  Name: Kelli Velasquez MRN: [id number] Date of Birth: 1941-07-11  Today's Date: 06/01/2017 Time: SLP Start Time (ACUTE ONLY): 31 -SLP Stop Time (ACUTE ONLY): 1017  SLP Time Calculation (min) (ACUTE ONLY): 12 min   Past Medical History:  Past Medical History:  Diagnosis Date  . Allergy    allergic rhinitis  . Asthma   . Chronic bronchitis ([place])   . Coronary artery disease    cath January 2011 with DEs LAD and RCA  . Dyspnea   . Full dentures   . Gallstones   . GERD (gastroesophageal reflux disease)   . History of echocardiogram    Echo 5/17: mod LVH, EF 50-55%, ant-septal HK, Gr 1 DD, mod LAE //  b.  Echo 7/17: mild concentric LVH, EF 45-50%, inf-lat, inf, inf-septal HK, mild LAE  . History of nuclear stress test    Myoview 7/17: EF 48%, small mild apical defect, no ischemia, low risk  . Hyperlipidemia   . Hypertension   . Myocardial infarction ([place])    subendocardial, initial episode, 2010 two stents placed  . Myopia   . 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 Nocturnal oxygen desaturation    o2 at night  . Obesity   . Osteoarthritis    knees, fingers, shoulders  . Overactive bladder   . Oxygen deficiency    uses oxygen all day  . Rash    and other non specific skin eruptions  . Retaining fluid    in ankles and feet  . Urinary incontinence   . Vertigo   . Wears glasses    Past Surgical History:  Past Surgical History:  Procedure Laterality Date  . CHOLECYSTECTOMY  92  . COLONOSCOPY    . CORONARY ANGIOPLASTY WITH STENT PLACEMENT  07/2009   stent  . DILATION AND CURETTAGE OF UTERUS  1984  . JOINT REPLACEMENT  2007   right total knee replacement  . TOTAL KNEE ARTHROPLASTY  2010   left , then vocal cord infection post op  . vocal cord polypectomy     HPI: 76yo female former smoker (quit 1984) with hx CAD, HTN, CHF, BOOP  followed by Dr. Melvyn Novas on chronic prednisone presented 10/31 with progressive SOB, cough, wheezing, BLE edema. She had significant respiratory distress in ER and failed trial bipap requiring intubation. Initial w/u revealed BNP >700, WBC 21.4 (chronic steroids), hypercarbic respiratory failure with pH 7.211, PCO2 99.4 prior to intubation from 10/31 to 11/4. Per husband she has been more SOB over last 1-2 weeks. Worsening orthopnea.    No Data Recorded   Assessment / Plan / Recommendation  CHL IP CLINICAL IMPRESSIONS 06/01/2017  Clinical Impression Pt demonstrates overall decline in function today, severely weak, short of breath, unable to consistently follow commands. Very brief FEES due to poor pt participation and inability to sustain O2 saturations while scope placed due to mouth breathing. SLP placed [place] at pts mouth with good return of saturations, but pt would not keep it in place. Direct visualization of airway revealed coated white mucous throughout pharynx, severely edematous arytenoids obliterating 50% of view of glottis and vocal folds as well as white excresence/contact ulcer on the epiglottis. Severe weakness resulted in diffuse residual of one bite of puree and one teaspoon of honey thick liquids. No aspiration occurred but despite max cues pt could not follow commands to initiate  multiple swallows to clear residue, so study was discontinued. Pt is not safe for PO at this time and will need to remain NPO except for a few ice chips with full supervision today to keep oropharynx moist. Will f/u for trials of PO at bedside and readiness for repeat testing.   SLP Visit Diagnosis Dysphagia, oropharyngeal phase (R13.12)  Attention and concentration deficit following --  Frontal lobe and executive function deficit following --  Impact on safety and function Severe aspiration risk      CHL IP TREATMENT RECOMMENDATION 06/01/2017  Treatment Recommendations Therapy as outlined in treatment plan  below     Prognosis 06/01/2017  Prognosis for Safe Diet Advancement Good  Barriers to Reach Goals --  Barriers/Prognosis Comment --    CHL IP DIET RECOMMENDATION 06/01/2017  SLP Diet Recommendations NPO  Liquid Administration via --  Medication Administration Via alternative means  Compensations --  Postural Changes --      CHL IP OTHER RECOMMENDATIONS 06/01/2017  Recommended Consults --  Oral Care Recommendations Oral care QID  Other Recommendations --      CHL IP FOLLOW UP RECOMMENDATIONS 06/01/2017  Follow up Recommendations Skilled Nursing facility      [facility] IP FREQUENCY AND DURATION 06/01/2017  Speech Therapy Frequency (ACUTE ONLY) min 2x/week  Treatment Duration 2 weeks           CHL IP ORAL PHASE 06/01/2017  Oral Phase WFL  Oral - Pudding Teaspoon --  Oral - Pudding Cup --  Oral - Honey Teaspoon --  Oral - Honey Cup --  Oral - Nectar Teaspoon --  Oral - Nectar Cup --  Oral - Nectar Straw --  Oral - Thin Teaspoon --  Oral - Thin Cup --  Oral - Thin Straw --  Oral - Puree --  Oral - Mech Soft --  Oral - Regular --  Oral - Multi-Consistency --  Oral - Pill --  Oral Phase - Comment --    CHL IP PHARYNGEAL PHASE 06/01/2017  Pharyngeal Phase Impaired  Pharyngeal- Pudding Teaspoon --  Pharyngeal --  Pharyngeal- Pudding Cup --  Pharyngeal --  Pharyngeal- Honey Teaspoon Reduced tongue base retraction;Reduced pharyngeal peristalsis;Pharyngeal residue - valleculae;Pharyngeal residue - pyriform  Pharyngeal --  Pharyngeal- Honey Cup --  Pharyngeal --  Pharyngeal- Nectar Teaspoon --  Pharyngeal --  Pharyngeal- Nectar Cup --  Pharyngeal --  Pharyngeal- Nectar Straw --  Pharyngeal --  Pharyngeal- Thin Teaspoon --  Pharyngeal --  Pharyngeal- Thin Cup --  Pharyngeal --  Pharyngeal- Thin Straw --  Pharyngeal --  Pharyngeal- Puree Pharyngeal residue - pyriform;Pharyngeal residue - valleculae;Lateral channel residue;Reduced tongue base retraction;Reduced laryngeal  elevation;Reduced epiglottic inversion;Delayed swallow initiation-vallecula  Pharyngeal --  Pharyngeal- Mechanical Soft --  Pharyngeal --  Pharyngeal- Regular --  Pharyngeal --  Pharyngeal- Multi-consistency --  Pharyngeal --  Pharyngeal- Pill --  Pharyngeal --  Pharyngeal Comment --     No flowsheet data found.  No flowsheet data found. Herbie [place], [place] CCC-SLP [phone]  Lynann Beaver 06/01/2017, 10:37 AM

## 2017-06-02 ENCOUNTER — Inpatient Hospital Stay (HOSPITAL_COMMUNITY): Payer: Medicare Other

## 2017-06-02 ENCOUNTER — Encounter (HOSPITAL_COMMUNITY)
Admission: EM | Disposition: A | Discharge status: Skilled Nursing Facility | Payer: Self-pay | Source: Home / Self Care | Attending: Pulmonary Disease

## 2017-06-02 DIAGNOSIS — J181 Lobar pneumonia, unspecified organism: Secondary | ICD-10-CM

## 2017-06-02 LAB — BLOOD GAS, ARTERIAL
Acid-Base Excess: 13.4 mmol/L — ABNORMAL HIGH (ref 0.0–2.0)
Acid-Base Excess: 10.8 mmol/L — ABNORMAL HIGH (ref 0.0–2.0)
BICARBONATE: 40.4 mmol/L — AB (ref 20.0–28.0)
BICARBONATE: 35 mmol/L — AB (ref 20.0–28.0)
DRAWN BY: 52078
Drawn by: 34560
FIO2: 100
LHR: 22 {breaths}/min
O2 Content: 12 L/min
O2 SAT: 99.2 %
O2 Saturation: 94.8 %
PATIENT TEMPERATURE: 97.8
PCO2 ART: 47.1 mmHg (ref 32.0–48.0)
PEEP: 5 cmH2O
PH ART: 7.291 — AB (ref 7.350–7.450)
PH ART: 7.482 — AB (ref 7.350–7.450)
Patient temperature: 97.4
VT: 460 mL
pCO2 arterial: 85.6 mmHg (ref 32.0–48.0)
pO2, Arterial: 82.9 mmHg — ABNORMAL LOW (ref 83.0–108.0)
pO2, Arterial: 266 mmHg — ABNORMAL HIGH (ref 83.0–108.0)

## 2017-06-02 LAB — RENAL FUNCTION PANEL
ANION GAP: 13 (ref 5–15)
Albumin: 3.3 g/dL — ABNORMAL LOW (ref 3.5–5.0)
BUN: 61 mg/dL — AB (ref 6–20)
CALCIUM: 9.6 mg/dL (ref 8.9–10.3)
CO2: 38 mmol/L — AB (ref 22–32)
Chloride: 98 mmol/L — ABNORMAL LOW (ref 101–111)
Creatinine, Ser: 1.35 mg/dL — ABNORMAL HIGH (ref 0.44–1.00)
GFR calc Af Amer: 43 mL/min — ABNORMAL LOW (ref >60)
GFR calc non Af Amer: 37 mL/min — ABNORMAL LOW (ref >60)
GLUCOSE: 154 mg/dL — AB (ref 65–99)
Phosphorus: 4.6 mg/dL (ref 2.5–4.6)
Potassium: 3.4 mmol/L — ABNORMAL LOW (ref 3.5–5.1)
SODIUM: 149 mmol/L — AB (ref 135–145)

## 2017-06-02 LAB — COMPREHENSIVE METABOLIC PANEL
ALT: 24 U/L (ref 14–54)
ANION GAP: 16 — AB (ref 5–15)
AST: 20 U/L (ref 15–41)
Albumin: 3 g/dL — ABNORMAL LOW (ref 3.5–5.0)
Alkaline Phosphatase: 64 U/L (ref 38–126)
BUN: 66 mg/dL — ABNORMAL HIGH (ref 6–20)
CO2: 28 mmol/L (ref 22–32)
CREATININE: 1.35 mg/dL — AB (ref 0.44–1.00)
Calcium: 8.8 mg/dL — ABNORMAL LOW (ref 8.9–10.3)
Chloride: 102 mmol/L (ref 101–111)
GFR, EST AFRICAN AMERICAN: 43 mL/min — AB (ref >60)
GFR, EST NON AFRICAN AMERICAN: 37 mL/min — AB (ref >60)
Glucose, Bld: 162 mg/dL — ABNORMAL HIGH (ref 65–99)
Potassium: 3.8 mmol/L (ref 3.5–5.1)
Sodium: 146 mmol/L — ABNORMAL HIGH (ref 135–145)
Total Bilirubin: 1.2 mg/dL (ref 0.3–1.2)
Total Protein: 6.2 g/dL — ABNORMAL LOW (ref 6.5–8.1)

## 2017-06-02 LAB — CBC WITH DIFFERENTIAL/PLATELET
Basophils Absolute: 0 10*3/uL (ref 0.0–0.1)
Basophils Relative: 0 %
EOS ABS: 0 10*3/uL (ref 0.0–0.7)
Eosinophils Relative: 0 %
HCT: 44.3 % (ref 36.0–46.0)
HEMOGLOBIN: 13.2 g/dL (ref 12.0–15.0)
LYMPHS ABS: 1.4 10*3/uL (ref 0.7–4.0)
Lymphocytes Relative: 8 %
MCH: 30 pg (ref 26.0–34.0)
MCHC: 29.8 g/dL — ABNORMAL LOW (ref 30.0–36.0)
MCV: 100.7 fL — ABNORMAL HIGH (ref 78.0–100.0)
Monocytes Absolute: 0.8 10*3/uL (ref 0.1–1.0)
Monocytes Relative: 4 %
NEUTROS ABS: 16.3 10*3/uL — AB (ref 1.7–7.7)
NEUTROS PCT: 88 %
Platelets: 198 10*3/uL (ref 150–400)
RBC: 4.4 MIL/uL (ref 3.87–5.11)
RDW: 15.4 % (ref 11.5–15.5)
WBC: 18.6 10*3/uL — AB (ref 4.0–10.5)

## 2017-06-02 LAB — LACTIC ACID, PLASMA
LACTIC ACID, VENOUS: 2.5 mmol/L — AB (ref 0.5–1.9)
Lactic Acid, Venous: 1.9 mmol/L (ref 0.5–1.9)
Lactic Acid, Venous: 2.3 mmol/L (ref 0.5–1.9)

## 2017-06-02 LAB — BODY FLUID CELL COUNT WITH DIFFERENTIAL
Lymphs, Fluid: 20 %
MONOCYTE-MACROPHAGE-SEROUS FLUID: 53 % (ref 50–90)
Neutrophil Count, Fluid: 27 % — ABNORMAL HIGH (ref 0–25)
Total Nucleated Cell Count, Fluid: 94 cu mm (ref 0–1000)

## 2017-06-02 LAB — PROTIME-INR
INR: 1.11
PROTHROMBIN TIME: 14.2 s (ref 11.4–15.2)

## 2017-06-02 LAB — CBC
HCT: 49 % — ABNORMAL HIGH (ref 36.0–46.0)
HEMOGLOBIN: 14.4 g/dL (ref 12.0–15.0)
MCH: 29.9 pg (ref 26.0–34.0)
MCHC: 29.4 g/dL — AB (ref 30.0–36.0)
MCV: 101.7 fL — ABNORMAL HIGH (ref 78.0–100.0)
Platelets: 269 10*3/uL (ref 150–400)
RBC: 4.82 MIL/uL (ref 3.87–5.11)
RDW: 15.2 % (ref 11.5–15.5)
WBC: 26.3 10*3/uL — ABNORMAL HIGH (ref 4.0–10.5)

## 2017-06-02 LAB — GLUCOSE, CAPILLARY
GLUCOSE-CAPILLARY: 163 mg/dL — AB (ref 65–99)
GLUCOSE-CAPILLARY: 157 mg/dL — AB (ref 65–99)
GLUCOSE-CAPILLARY: 139 mg/dL — AB (ref 65–99)
Glucose-Capillary: 321 mg/dL — ABNORMAL HIGH (ref 65–99)
Glucose-Capillary: 315 mg/dL — ABNORMAL HIGH (ref 65–99)

## 2017-06-02 LAB — PHOSPHORUS
Phosphorus: 3.9 mg/dL (ref 2.5–4.6)
Phosphorus: 1.7 mg/dL — ABNORMAL LOW (ref 2.5–4.6)

## 2017-06-02 LAB — TROPONIN I
TROPONIN I: 0.06 ng/mL — AB (ref <0.03)
TROPONIN I: 0.05 ng/mL — AB (ref <0.03)
Troponin I: 0.05 ng/mL (ref <0.03)

## 2017-06-02 LAB — MAGNESIUM
Magnesium: 2.2 mg/dL (ref 1.7–2.4)
Magnesium: 2.5 mg/dL — ABNORMAL HIGH (ref 1.7–2.4)

## 2017-06-02 LAB — PROCALCITONIN

## 2017-06-02 SURGERY — RIGHT/LEFT HEART CATH AND CORONARY ANGIOGRAPHY
Anesthesia: LOCAL

## 2017-06-02 MED ORDER — MIDAZOLAM HCL 2 MG/2ML IJ SOLN
2.0000 mg | Freq: Once | INTRAMUSCULAR | Status: AC
  Administered 2017-06-02: 2 mg via INTRAVENOUS

## 2017-06-02 MED ORDER — FENTANYL BOLUS VIA INFUSION
25.0000 ug | INTRAVENOUS | Status: DC | PRN
  Administered 2017-06-04 – 2017-06-07 (×10): 25 ug via INTRAVENOUS
  Filled 2017-06-02: qty 25

## 2017-06-02 MED ORDER — VITAL HIGH PROTEIN PO LIQD
1000.0000 mL | ORAL | Status: DC
  Administered 2017-06-02 – 2017-06-06 (×7): 1000 mL

## 2017-06-02 MED ORDER — FENTANYL 2500MCG IN NS 250ML (10MCG/ML) PREMIX INFUSION
25.0000 ug/h | INTRAVENOUS | Status: DC
  Administered 2017-06-02: 100 ug/h via INTRAVENOUS
  Administered 2017-06-02 – 2017-06-03 (×3): 200 ug/h via INTRAVENOUS
  Administered 2017-06-04: 100 ug/h via INTRAVENOUS
  Administered 2017-06-05: 75 ug/h via INTRAVENOUS
  Administered 2017-06-06: 225 ug/h via INTRAVENOUS
  Administered 2017-06-06: 175 ug/h via INTRAVENOUS
  Administered 2017-06-07: 150 ug/h via INTRAVENOUS
  Filled 2017-06-02 (×9): qty 250

## 2017-06-02 MED ORDER — ASPIRIN 81 MG PO CHEW
81.0000 mg | CHEWABLE_TABLET | Freq: Every day | ORAL | Status: DC
  Administered 2017-06-02 – 2017-06-07 (×6): 81 mg
  Filled 2017-06-02 (×6): qty 1

## 2017-06-02 MED ORDER — FENTANYL CITRATE (PF) 100 MCG/2ML IJ SOLN
100.0000 ug | Freq: Once | INTRAMUSCULAR | Status: AC
  Administered 2017-06-02: 100 ug via INTRAVENOUS

## 2017-06-02 MED ORDER — PREDNISONE 20 MG PO TABS: 20.0000 mg | ORAL_TABLET | Freq: Every day | ORAL | Status: DC

## 2017-06-02 MED ORDER — SUCCINYLCHOLINE CHLORIDE 20 MG/ML IJ SOLN
100.0000 mg | Freq: Once | INTRAMUSCULAR | Status: AC
  Administered 2017-06-02: 100 mg via INTRAVENOUS
  Filled 2017-06-02: qty 5

## 2017-06-02 MED ORDER — GUAIFENESIN 100 MG/5ML PO SOLN
10.0000 mL | Freq: Three times a day (TID) | ORAL | Status: DC
  Administered 2017-06-02 – 2017-06-07 (×15): 200 mg
  Filled 2017-06-02 (×16): qty 10

## 2017-06-02 MED ORDER — PRO-STAT SUGAR FREE PO LIQD
30.0000 mL | Freq: Two times a day (BID) | ORAL | Status: DC
  Administered 2017-06-02: 30 mL
  Filled 2017-06-02: qty 30

## 2017-06-02 MED ORDER — FENTANYL CITRATE (PF) 100 MCG/2ML IJ SOLN
50.0000 ug | Freq: Once | INTRAMUSCULAR | Status: AC
  Administered 2017-06-02: 50 ug via INTRAVENOUS

## 2017-06-02 MED ORDER — VANCOMYCIN HCL 10 G IV SOLR
1250.0000 mg | INTRAVENOUS | Status: DC
  Administered 2017-06-03: 1250 mg via INTRAVENOUS
  Filled 2017-06-02: qty 1250

## 2017-06-02 MED ORDER — INSULIN GLARGINE 100 UNIT/ML ~~LOC~~ SOLN
10.0000 [IU] | Freq: Every day | SUBCUTANEOUS | Status: DC
  Administered 2017-06-02: 10 [IU] via SUBCUTANEOUS
  Filled 2017-06-02: qty 0.1

## 2017-06-02 MED ORDER — INSULIN ASPART 100 UNIT/ML ~~LOC~~ SOLN: 0.0000 [IU] | SUBCUTANEOUS | Status: DC

## 2017-06-02 MED ORDER — VITAL HIGH PROTEIN PO LIQD
1000.0000 mL | ORAL | Status: DC
  Administered 2017-06-02: 1000 mL

## 2017-06-02 MED ORDER — ETOMIDATE 2 MG/ML IV SOLN
30.0000 mg | Freq: Once | INTRAVENOUS | Status: AC
  Administered 2017-06-02: 30 mg via INTRAVENOUS

## 2017-06-02 MED ORDER — SODIUM CHLORIDE 0.9% FLUSH
10.0000 mL | Freq: Two times a day (BID) | INTRAVENOUS | Status: DC
  Administered 2017-06-02 – 2017-06-21 (×29): 10 mL

## 2017-06-02 MED ORDER — MIDAZOLAM HCL 2 MG/2ML IJ SOLN
INTRAMUSCULAR | Status: AC
  Administered 2017-06-02: 2 mg via INTRAVENOUS
  Filled 2017-06-02: qty 4

## 2017-06-02 MED ORDER — POTASSIUM CHLORIDE 20 MEQ/15ML (10%) PO SOLN
20.0000 meq | Freq: Once | ORAL | Status: AC
  Administered 2017-06-02: 20 meq
  Filled 2017-06-02: qty 15

## 2017-06-02 MED ORDER — METHYLPREDNISOLONE SODIUM SUCC 40 MG IJ SOLR
40.0000 mg | Freq: Four times a day (QID) | INTRAMUSCULAR | Status: DC
  Administered 2017-06-02 – 2017-06-04 (×8): 40 mg via INTRAVENOUS
  Filled 2017-06-02 (×9): qty 1

## 2017-06-02 MED ORDER — VANCOMYCIN HCL 10 G IV SOLR
2000.0000 mg | Freq: Once | INTRAVENOUS | Status: AC
  Administered 2017-06-02: 2000 mg via INTRAVENOUS
  Filled 2017-06-02: qty 2000

## 2017-06-02 MED ORDER — FREE WATER
60.0000 mL | Status: DC
  Administered 2017-06-02 – 2017-06-05 (×18): 60 mL

## 2017-06-02 MED ORDER — SODIUM CHLORIDE 0.9% FLUSH
10.0000 mL | INTRAVENOUS | Status: DC | PRN
  Administered 2017-06-03: 10 mL
  Filled 2017-06-02: qty 40

## 2017-06-02 MED ORDER — DEXTROSE 5 % IV SOLN
2.0000 g | Freq: Two times a day (BID) | INTRAVENOUS | Status: DC
  Administered 2017-06-02 – 2017-06-05 (×8): 2 g via INTRAVENOUS
  Filled 2017-06-02 (×9): qty 2

## 2017-06-02 MED ORDER — PANTOPRAZOLE SODIUM 40 MG PO PACK
40.0000 mg | PACK | Freq: Every day | ORAL | Status: DC
  Administered 2017-06-02 – 2017-06-07 (×6): 40 mg
  Filled 2017-06-02 (×6): qty 20

## 2017-06-02 MED ORDER — EZETIMIBE 10 MG PO TABS
10.0000 mg | ORAL_TABLET | Freq: Every day | ORAL | Status: DC
  Administered 2017-06-02 – 2017-06-06 (×5): 10 mg
  Filled 2017-06-02 (×5): qty 1

## 2017-06-02 MED ORDER — FUROSEMIDE 10 MG/ML IJ SOLN
20.0000 mg | Freq: Once | INTRAMUSCULAR | Status: AC
  Administered 2017-06-02: 20 mg via INTRAVENOUS
  Filled 2017-06-02: qty 2

## 2017-06-02 MED ORDER — MIDAZOLAM HCL 2 MG/2ML IJ SOLN
1.0000 mg | INTRAMUSCULAR | Status: DC | PRN
  Administered 2017-06-03 – 2017-06-06 (×10): 2 mg via INTRAVENOUS
  Filled 2017-06-02 (×10): qty 2

## 2017-06-02 MED ORDER — INSULIN ASPART 100 UNIT/ML ~~LOC~~ SOLN
0.0000 [IU] | SUBCUTANEOUS | Status: DC
  Administered 2017-06-02 (×2): 15 [IU] via SUBCUTANEOUS
  Administered 2017-06-02 – 2017-06-03 (×2): 7 [IU] via SUBCUTANEOUS
  Administered 2017-06-03: 15 [IU] via SUBCUTANEOUS
  Administered 2017-06-03: 7 [IU] via SUBCUTANEOUS
  Administered 2017-06-03: 11 [IU] via SUBCUTANEOUS
  Administered 2017-06-03: 7 [IU] via SUBCUTANEOUS
  Administered 2017-06-03: 11 [IU] via SUBCUTANEOUS
  Administered 2017-06-04: 20 [IU] via SUBCUTANEOUS
  Administered 2017-06-04 (×2): 11 [IU] via SUBCUTANEOUS
  Administered 2017-06-04: 15 [IU] via SUBCUTANEOUS
  Administered 2017-06-04 – 2017-06-05 (×5): 7 [IU] via SUBCUTANEOUS
  Administered 2017-06-05: 11 [IU] via SUBCUTANEOUS
  Administered 2017-06-05: 4 [IU] via SUBCUTANEOUS
  Administered 2017-06-05: 3 [IU] via SUBCUTANEOUS
  Administered 2017-06-05: 4 [IU] via SUBCUTANEOUS
  Administered 2017-06-06 (×3): 7 [IU] via SUBCUTANEOUS
  Administered 2017-06-06: 4 [IU] via SUBCUTANEOUS
  Administered 2017-06-06: 11 [IU] via SUBCUTANEOUS
  Administered 2017-06-07 (×2): 7 [IU] via SUBCUTANEOUS
  Administered 2017-06-07: 11 [IU] via SUBCUTANEOUS
  Administered 2017-06-07: 7 [IU] via SUBCUTANEOUS
  Administered 2017-06-08 (×2): 3 [IU] via SUBCUTANEOUS
  Administered 2017-06-08: 7 [IU] via SUBCUTANEOUS
  Administered 2017-06-09: 4 [IU] via SUBCUTANEOUS
  Administered 2017-06-09: 7 [IU] via SUBCUTANEOUS
  Administered 2017-06-09: 4 [IU] via SUBCUTANEOUS
  Administered 2017-06-09: 11 [IU] via SUBCUTANEOUS
  Administered 2017-06-09: 15 [IU] via SUBCUTANEOUS
  Administered 2017-06-09: 3 [IU] via SUBCUTANEOUS
  Administered 2017-06-10 (×3): 4 [IU] via SUBCUTANEOUS
  Administered 2017-06-10: 7 [IU] via SUBCUTANEOUS
  Administered 2017-06-10: 4 [IU] via SUBCUTANEOUS
  Administered 2017-06-11: 7 [IU] via SUBCUTANEOUS
  Administered 2017-06-11: 3 [IU] via SUBCUTANEOUS
  Administered 2017-06-11 – 2017-06-12 (×3): 4 [IU] via SUBCUTANEOUS
  Administered 2017-06-12: 3 [IU] via SUBCUTANEOUS
  Administered 2017-06-12: 11 [IU] via SUBCUTANEOUS
  Administered 2017-06-12: 3 [IU] via SUBCUTANEOUS
  Administered 2017-06-13 (×2): 4 [IU] via SUBCUTANEOUS
  Administered 2017-06-13: 11 [IU] via SUBCUTANEOUS
  Administered 2017-06-13 (×2): 4 [IU] via SUBCUTANEOUS
  Administered 2017-06-13: 3 [IU] via SUBCUTANEOUS
  Administered 2017-06-14 (×2): 4 [IU] via SUBCUTANEOUS
  Administered 2017-06-14: 3 [IU] via SUBCUTANEOUS
  Administered 2017-06-14: 4 [IU] via SUBCUTANEOUS
  Administered 2017-06-15: 3 [IU] via SUBCUTANEOUS
  Administered 2017-06-15: 4 [IU] via SUBCUTANEOUS
  Administered 2017-06-15: 3 [IU] via SUBCUTANEOUS
  Administered 2017-06-15: 7 [IU] via SUBCUTANEOUS
  Administered 2017-06-15 – 2017-06-16 (×3): 3 [IU] via SUBCUTANEOUS
  Administered 2017-06-17 (×3): 4 [IU] via SUBCUTANEOUS
  Administered 2017-06-18: 3 [IU] via SUBCUTANEOUS
  Administered 2017-06-18 (×2): 4 [IU] via SUBCUTANEOUS
  Administered 2017-06-18: 3 [IU] via SUBCUTANEOUS
  Administered 2017-06-19: 4 [IU] via SUBCUTANEOUS
  Administered 2017-06-19: 3 [IU] via SUBCUTANEOUS
  Administered 2017-06-19: 4 [IU] via SUBCUTANEOUS
  Administered 2017-06-20 (×2): 3 [IU] via SUBCUTANEOUS
  Administered 2017-06-20 (×2): 4 [IU] via SUBCUTANEOUS
  Administered 2017-06-21 – 2017-06-22 (×4): 3 [IU] via SUBCUTANEOUS
  Administered 2017-06-22: 4 [IU] via SUBCUTANEOUS
  Administered 2017-06-22: 3 [IU] via SUBCUTANEOUS
  Administered 2017-06-22: 4 [IU] via SUBCUTANEOUS
  Administered 2017-06-23: 3 [IU] via SUBCUTANEOUS
  Administered 2017-06-23: 4 [IU] via SUBCUTANEOUS
  Administered 2017-06-23: 3 [IU] via SUBCUTANEOUS
  Administered 2017-06-24 (×2): 4 [IU] via SUBCUTANEOUS

## 2017-06-02 MED ORDER — INSULIN ASPART 100 UNIT/ML ~~LOC~~ SOLN
2.0000 [IU] | SUBCUTANEOUS | Status: DC
  Administered 2017-06-02: 4 [IU] via SUBCUTANEOUS
  Administered 2017-06-02: 2 [IU] via SUBCUTANEOUS

## 2017-06-02 MED ORDER — CHLORHEXIDINE GLUCONATE CLOTH 2 % EX PADS
6.0000 | MEDICATED_PAD | Freq: Every day | CUTANEOUS | Status: DC
  Administered 2017-06-02 – 2017-06-15 (×13): 6 via TOPICAL

## 2017-06-02 MED ORDER — MONTELUKAST SODIUM 10 MG PO TABS
10.0000 mg | ORAL_TABLET | Freq: Every day | ORAL | Status: DC
  Administered 2017-06-02 – 2017-06-06 (×5): 10 mg
  Filled 2017-06-02 (×5): qty 1

## 2017-06-02 NOTE — Progress Notes (Signed)
Patient intubated overnight. Critical care team to take over medical care while she is intubated.  Please call [phone] when patient is stable enough for medical team to resume care.  Karys Meckley, [employer]

## 2017-06-02 NOTE — Progress Notes (Signed)
PULMONARY / CRITICAL CARE MEDICINE   Name: Kelli Velasquez MRN: [id number] DOB: [birth year]    ADMISSION DATE:  05/26/2017  REFERRING MD:  EDP  CHIEF COMPLAINT:  Respiratory failure   HISTORY OF PRESENT ILLNESS:   76yo female former smoker (quit 1984) with hx CAD, HTN, CHF, BOOP followed by Dr. Melvyn Novas on chronic prednisone presented 10/31 with progressive SOB, cough, wheezing, BLE edema.  She had significant respiratory distress in ER and failed trial bipap requiring intubation.  Initial w/u revealed BNP >700, WBC 21.4 (chronic steroids), hypercarbic respiratory failure with pH 7.211, PCO2 99.4. She required intubation and responded well to diuresis. She was extubated 11/4 and transferred out to SDU the following day. Further diuresis limited by arrhythmia and electrolyte abnormality. Failed swallow eval.    SUBJECTIVE:   Now 11/7 early AM became hypoxemic and hypercarbic by blood gas. Agitation transitioned into somnolence. Started on BiPAP, but lethargy worsened. Given lasix with minimal output. Transferred back to ICU for intubation.   VITAL SIGNS: BP (!) 158/68 (BP Location: Right Arm)   Pulse (!) 42   Temp 98.1 F (36.7 C) (Oral)   Resp (!) 23   Ht _0  (1.626 m)   Wt 112.4 kg (247 lb 12.8 oz)   LMP 07/27/1980   SpO2 92%   BMI 42.53 kg/m   HEMODYNAMICS:    VENTILATOR SETTINGS: FiO2 (%):  [40 %] 40 %  INTAKE / OUTPUT: I/O last 3 completed shifts: In: 860 [P.O.:830; I.V.:30] Out: 2590 [Urine:2590]  PHYSICAL EXAMINATION:  General:  Morbidly obese female on BiPAP Neuro:  Minimally responsive to only painful stimuli.  HEENT: [place]/AT, PERRL, unable to appreciate JVD Cardiovascular:  RRR, no MRG Lungs:  Coarse throughout, mild inspiratory wheeze. Diminished bases. Overall, poor air entry.  Abdomen: Obese, soft, nondistended Musculoskeletal:  Warm and dry, scant BLE. No acute deformity.   LABS:  BMET Recent Labs  Lab 05/30/17 1507 05/31/17 0311 06/01/17 0213  NA 146*  147* 144  K 4.0 4.6 3.4*  CL 98* 101 96*  CO2 35* 35* 36*  BUN 77* 77* 59*  CREATININE 1.38* 1.31* 1.31*  GLUCOSE 215* 170* 144*    Electrolytes Recent Labs  Lab 05/29/17 0659  05/30/17 1507 05/31/17 0311 06/01/17 0213  CALCIUM 9.0   < > 9.4 9.7 9.6  MG 2.5*  --  2.5* 2.5* 2.3  PHOS 6.6*  --   --  4.8* 3.7   < > = values in this interval not displayed.    CBC Recent Labs  Lab 05/30/17 0511 05/31/17 0311 06/01/17 0213  WBC 24.0* 24.4* 23.0*  HGB 12.9 15.1* 14.2  HCT 43.3 49.4* 47.6*  PLT 170 200 217    Coag's No results for input(s): APTT, INR in the last 168 hours.  Sepsis Markers Recent Labs  Lab 05/26/17 1124  PROCALCITON <0.10    ABG Recent Labs  Lab 05/29/17 0342 05/30/17 0330 06/02/17 0140  PHART 7.495* 7.477* 7.291*  PCO2ART 59.3* 54.8* 85.6*  PO2ART 93.6 69.1* 82.9*    Liver Enzymes Recent Labs  Lab 06/01/17 0213  ALBUMIN 3.2*    Cardiac Enzymes Recent Labs  Lab 05/30/17 0511 05/30/17 1025 05/30/17 1507  TROPONINI 0.06* 0.08* 0.22*    Glucose Recent Labs  Lab 05/31/17 2132 06/01/17 0615 06/01/17 1132 06/01/17 1632 06/01/17 2144 06/02/17 0226  GLUCAP 173* 163* 206* 202* 144* 163*    Imaging Dg Chest Port 1 View  Result Date: 06/02/2017 CLINICAL DATA:  Shortness of breath EXAM:  PORTABLE CHEST 1 VIEW COMPARISON:  05/31/2017 FINDINGS: Shallow inspiration with atelectasis or consolidation in both lung bases. Probable bilateral pleural effusions. Cardiac enlargement. Pulmonary vascularity is normal. No pneumothorax. Calcification of the aorta. IMPRESSION: Shallow inspiration with infiltration or atelectasis in the lung bases. Probable small pleural effusions. Cardiac enlargement. Electronically Signed   By: Lucienne Capers M.D.   On: 06/02/2017 01:12    STUDIES:  Echo 10/31>>> EF 35-40%, diffuse hypokinesis  CULTURES: BCx2 11/7 > BAL 11/7 >  ANTIBIOTICS: Diflucan (Oral thrush) 11/3>>   Ceftazidine 11/7 > Vancomycin  11/7 >  SIGNIFICANT EVENTS: Admit 10/31 11/4 extubate 11/5 transfer SDU 11/7 transfer ICU reintubated.  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 Improved initially with diuresis and was extuabted successfully, however, two days later 11/7 became hypoxemic/hypercarbic requiring reintubation.   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  CT chest to better characterize infiltrates.  Continue budesonide, duoneb, singulari Family was OK with one more intubation, which has now been done. Diuresis directed by advanced CHF service. Will hold for now as renal function has been increasing.   CARDIOVASCULAR A: Acute on chronic systolic and diastolic CHF Net Negative>> 16 L on 11/7. Echo with EF 35-40% and diffuse hypokinesis Troponin elevation Hx HTN.  P:   Initial plan was for RHC and LHC tomorrow, doubt this will happen with interval decompensation.  Advanced CHF team following and directing volume management.  Holding antihypertensives and diuretics for now as she has been hypotensive since intubation.  Assess troponin  RENAL A: Acute kidney injury (improving slowly) Hypokalemia P:  Repeat CMP now  GASTROINTESTINAL A: GERD  P:   PPI  NPO for now  Tube feeds per protocol  HEMATOLOGIC A: No acute issues P:  CBC w diff  INFECTIOUS A: Possible HCAP vs aspiration -purulent secretions noted during intubation. Bilateral infiltrates.  PCT negative. WBC increased, ? hemoconcentration in the setting of diuresis. Oral thrush Plan:  Start HCAP coverage as above Follow BC and BAL, fungal culture, AFB, PJP Diflucan started 11/3 for oral  thrush Follow fever curve and WBC  ENDOCRINE Hyperglycemia   P: CBG Q 4   SSI   NEUROLOGIC A: Acute metabolic encephalopathy:  Hypoxemic, hypercarbia P: Ventilate to correct metabolic abnormalities Fentanyl infusion and PRN for sedation/analgesia  FAMILY  - Updates: husband updated of status change - Inter-disciplinary family meet or Palliative Care meeting due by: 11/13   PGeorgann Housekeeper AGACNP-BC LReevesPulmonology/Critical Care Pager [phone] ([phone] 06/02/2017 3:34 AM

## 2017-06-02 NOTE — Consult Note (Signed)
            [facility] CM Primary Care Navigator  06/02/2017  Kelli Velasquez 1941/06/28 [id number]   Attempt to see patient at the bedside to identify possible discharge needs but staffreports that patient was transferred to 15M 11 due to respiratorydistress requiring intubation.  Will attempt to see patient at another time when out of ICU.   For additional questions please contact:  Edwena Felty A. Rolla Servidio, BSN, RN-BC [facility] PRIMARY CARE Navigator Cell: [phone]

## 2017-06-02 NOTE — Procedures (Signed)
Endotracheal Intubation Procedure Note Indication for endotracheal intubation: respiratory failure Sedation: etomidate and midazolam Paralytic: succinylcholine Equipment: Macintosh 4 laryngoscope blade and 8.3mm cuffed endotracheal tube Cricoid Pressure: yes Number of attempts: 1 ETT location confirmed by by auscultation and ETCO2 monitor.  Patient with hypoxic and hypercapneic respiratory failure failing BIPAP, unresponsive on BIPAP and not protecting airway. Emergently intubated patient. Tolerated procedure well with no complications. Copious creamy white secretions in posterior oropharynx noted during intubation, were suctioned and removed. False cords very swollen noted during intubation. Grade 1 view of cords with cricoid pressure. Intubated on 1st attempt. ETT cuff inflated and secured in place. Good color change on CO2 detector. Equal bilateral breath sounds. ETT position confirmed via bronchoscopy which took place immediately following intubation.

## 2017-06-02 NOTE — Progress Notes (Signed)
Pharmacy Antibiotic Note  Kelli Velasquez is a 76 y.o. female admitted on 05/26/2017 with respiratory failure, now w/ concern for pneumonia.  Pharmacy has been consulted for Vancocin and Fortaz dosing.  Plan: Vancomycin 2000mg  x1 then 1250mg  IV every 24 hours.  Goal trough 15-20 mcg/mL.  Fortaz 2g IV every 12 hours.  Height: 5\' 4"  (162.6 cm) Weight: 247 lb 12.8 oz (112.4 kg) IBW/kg (Calculated) : 54.7  Temp (24hrs), Avg:98.1 F (36.7 C), Min:98 F (36.7 C), Max:98.3 F (36.8 C)  Recent Labs  Lab 05/29/17 0659  05/30/17 0511 05/30/17 1507 05/31/17 0311 06/01/17 0213 06/02/17 0159  WBC 25.2*  --  24.0*  --  24.4* 23.0* 26.3*  CREATININE 1.58*   < > 1.39* 1.38* 1.31* 1.31* 1.35*   < > = values in this interval not displayed.    Estimated Creatinine Clearance: 42.9 mL/min (A) (by C-G formula based on SCr of 1.35 mg/dL (H)).    Allergies  Allergen Reactions  . Fish Allergy Anaphylaxis  . Shellfish Allergy Anaphylaxis  . Aspirin     REACTION: nausea and vomiting High doses  . Atorvastatin     REACTION: leg pain  . Crestor [Rosuvastatin Calcium] Other (See Comments)    Muscle pain - allergy/intolerance  . Penicillins     Due to mold allergy per pt  . Simvastatin     REACTION: muscle pain  . Trandolapril     REACTION: leg pain     Thank you for allowing pharmacy to be a part of this patient's care.  Wynona Neat, PharmD, BCPS  06/02/2017 3:40 AM

## 2017-06-02 NOTE — Progress Notes (Signed)
After being placed on Bipap, pt started to calm but became unresponsive and diaphoretic.  Seemed to have just fallen asleep.  Blood sugar 164.  Could only wake up a for a few seconds when stimulated. O2 @ 99%.  Physician informed of pt status and ordered to transfer pt to ICU.   Lupita Dawn, RN

## 2017-06-02 NOTE — Progress Notes (Addendum)
eLink Physician-Brief Progress Note Patient Name: Kelli Velasquez DOB: November 19, 1940 MRN: [id number]   Date of Service  06/02/2017  HPI/Events of Note  Called by hospitalist to evaluate secondary to increasing respiratory distress, worsening acidosis with pH of 7.29, an increase in PCO2.  Reviewed chest x-ray shows bibasilar atelectasis, possible left-sided pleural effusion.  This may actually be improved from prior a chest x-ray.  eICU Interventions  Continue BiPAP.  We will have the P CCM team to evaluate.        Laverle Hobby 06/02/2017, 2:17 AM

## 2017-06-02 NOTE — Progress Notes (Signed)
CPT on hold for right now due to pt aggitation

## 2017-06-02 NOTE — Progress Notes (Signed)
Patient ID: Kelli Velasquez, female   DOB: May 16, 1941, 76 y.o.   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 She has not been on bb due to wheezing and bronchitis.  Over 2 weeks prior to admission, she had increased dyspnea with exertion, she was short of breath at rest on the day of admission.  Her husband says she has been taking 20 mg of lasix daily and occasionally an extra 20 mg of lasix.   Presented to [facility] ED with respiratory distress. Family reported increased dyspnea over the last week requiring 2 liters oxygen at home. In ED she received solumedrol and duonebs. Placed on Bipap but continue decline requiring intubation. CXR concerning for interstitial edema and bilateral effusions.  Extubated 11/4. Noted to have swelling of upper airways on speech/swallow evaluation 11/6.  Developed respiratory distress 11/6 in the evening with hypoxia and hypercapnia and was re-intubated.  This morning, she is on vent, awake but drowsy. Afebrile. Weight is lower.   CXR with bibasilar opacities.  Studies:  Echo 05/26/2017 EF 35-40%  Grade I DD RV normal Echo 2017 EF 45-50% with WMA Myoview 2017 No significant ischemia EF 48%.  [place] 2011- DES LAD and RCA.   Objective:   Weight Range: 235 lb 7.2 oz (106.8 kg) Body mass index is 40.42 kg/m.   Vital Signs:   Temp:  [97.8 F (36.6 C)-98.1 F (36.7 C)] 97.8 F (36.6 C) (11/07 0353) Pulse Rate:  [29-101] 29 (11/07 0630) Resp:  [15-25] 21 (11/07 0630) BP: (77-184)/(55-157) 125/81 (11/07 0630) SpO2:  [90 %-100 %] 100 % (11/07 0630) FiO2 (%):  [40 %-100 %] 60 % (11/07 0456) Weight:  [235 lb 7.2 oz (106.8 kg)] 235 lb 7.2 oz (106.8 kg) (11/07 0500) Last BM Date: 06/01/17  Weight change: Filed Weights   05/31/17 0300 05/31/17 2306 06/02/17 0500  Weight: 237 lb 3.4 oz (107.6 kg) 247  lb 12.8 oz (112.4 kg) 235 lb 7.2 oz (106.8 kg)   Intake/Output:   Intake/Output Summary (Last 24 hours) at 06/02/2017 0737 Last data filed at 06/02/2017 0523 Gross per 24 hour  Intake 50 ml  Output 1550 ml  Net -1500 ml    Physical Exam   General: Intubated Neck: Thick, JVP difficult, no thyromegaly or thyroid nodule.  Lungs: Dependent crackles.  CV: Nondisplaced PMI.  Heart regular S1/S2, no S3/S4, no murmur.  No peripheral edema.   Abdomen: Soft, no hepatosplenomegaly, no distention.  Skin: Intact without lesions or rashes.  Neurologic: Alert and oriented x 3.  Psych: Normal affect. Extremities: No clubbing or cyanosis.  HEENT: Normal.    Telemetry   NSR with PACs, personally reviewed.    Labs    CBC Recent Labs    06/01/17 0213 06/02/17 0159 06/02/17 0346  WBC 23.0* 26.3* 18.6*  NEUTROABS 19.3*  --  16.3*  HGB 14.2 14.4 13.2  HCT 47.6* 49.0* 44.3  MCV 100.4* 101.7* 100.7*  PLT 217 269 782   Basic Metabolic Panel Recent Labs    06/01/17 0213 06/02/17 0159 06/02/17 0340 06/02/17 0346  NA 144 149*  --  146*  K 3.4* 3.4*  --  3.8  CL 96* 98*  --  102  CO2 36* 38*  --  28  GLUCOSE 144* 154*  --  162*  BUN 59* 61*  --  66*  CREATININE 1.31* 1.35*  --  1.35*  CALCIUM 9.6 9.6  --  8.8*  MG 2.3  --  2.2  --   PHOS 3.7 4.6 3.9  --    Liver Function Tests Recent Labs    06/02/17 0159 06/02/17 0346  AST  --  20  ALT  --  24  ALKPHOS  --  64  BILITOT  --  1.2  PROT  --  6.2*  ALBUMIN 3.3* 3.0*   No results for input(s): LIPASE, AMYLASE in the last 72 hours. Cardiac Enzymes Recent Labs    05/30/17 1025 05/30/17 1507 06/02/17 0346  TROPONINI 0.08* 0.22* 0.06*   BNP: BNP (last 3 results) Recent Labs    05/26/17 0610  BNP 760.9*   ProBNP (last 3 results) Recent Labs    01/11/17 1053  PROBNP 264.0*   D-Dimer No results for input(s): DDIMER in the last 72 hours. Hemoglobin A1C No results for input(s): HGBA1C in the last 72  hours. Fasting Lipid Panel No results for input(s): CHOL, HDL, LDLCALC, TRIG, CHOLHDL, LDLDIRECT in the last 72 hours. Thyroid Function Tests No results for input(s): TSH, T4TOTAL, T3FREE, THYROIDAB in the last 72 hours.  Invalid input(s): FREET3  Other results:  Imaging   Dg Chest Port 1 View  Result Date: 06/02/2017 CLINICAL DATA:  Shortness of breath EXAM: PORTABLE CHEST 1 VIEW COMPARISON:  05/31/2017 FINDINGS: Shallow inspiration with atelectasis or consolidation in both lung bases. Probable bilateral pleural effusions. Cardiac enlargement. Pulmonary vascularity is normal. No pneumothorax. Calcification of the aorta. IMPRESSION: Shallow inspiration with infiltration or atelectasis in the lung bases. Probable small pleural effusions. Cardiac enlargement. Electronically Signed   By: Lucienne Capers M.D.   On: 06/02/2017 01:12   Dg Abd Portable 1v  Result Date: 06/02/2017 CLINICAL DATA:  OG tube placement EXAM: PORTABLE ABDOMEN - 1 VIEW COMPARISON:  05/29/2017 FINDINGS: Enteric tube tip is in the left upper quadrant consistent with location in the body of the stomach. Gas-filled small and large bowel without distention may indicate ileus. Surgical clips in the right upper quadrant. IMPRESSION: Enteric tube tip is in the left upper quadrant consistent with location in the body of the stomach. Electronically Signed   By: Lucienne Capers M.D.   On: 06/02/2017 04:06    Medications:     Scheduled Medications: . aspirin EC  81 mg Oral Daily  . budesonide (PULMICORT) nebulizer solution  0.25 mg Nebulization BID  . chlorhexidine gluconate (MEDLINE KIT)  15 mL Mouth Rinse BID  . ezetimibe  10 mg Per Tube QHS  . feeding supplement (PRO-STAT SUGAR FREE 64)  30 mL Per Tube BID  . feeding supplement (VITAL HIGH PROTEIN)  1,000 mL Per Tube Q24H  . free water  60 mL Per Tube Q4H  . guaiFENesin  600 mg Oral BID  . heparin  5,000 Units Subcutaneous Q8H  . insulin aspart  2-6 Units Subcutaneous  Q4H  . ipratropium-albuterol  3 mL Nebulization Q6H  . mouth rinse  15 mL Mouth Rinse QID  . montelukast  10 mg Per Tube QHS  . pantoprazole  40 mg Oral Daily  . predniSONE  20 mg Oral Q breakfast    Infusions: . sodium chloride 250 mL (05/30/17 1700)  . sodium chloride    . cefTAZidime (FORTAZ)  IV Stopped (06/02/17 0523)  . fentaNYL infusion INTRAVENOUS 100 mcg/hr (06/02/17 0412)  . [START ON 06/03/2017] vancomycin    .  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  Admitted with acute/chronic respiratory failure  Assessment/Plan   1. Acute/chronic systolic CHF: Probably ischemic cardiomyopathy given prior history.  Echo this admission with EF down to 35-40% range, around 50% in past.  She was admitted with worsening dyspnea, elevated BNP, CXR with pulmonary edema. PCT not elevated.  Has history of BOOP.  Volume status improved and extubated 11/4.  However, on 11/6 developed respiratory distress and was intubated again.  My suspicion here is more HCAP versus aspiration versus BOOP flare than pulmonary edema, but exam is very difficult for volume.  - We had planned right/left heart cath, but with ongoing significant BUN elevation and re-intubation, will hold off for now.  I think that central access would be very helpful to follow volume status => recommend placement of PICC or CVL and following CVP.  - Lasix on hold for now.   - Losartan on hold with BUN/creatinine elevation.  - I think continuation of low dose spironolactone would be reasonable.   - Use hydralazine/nitrates if BP control is needed.  - She has not tolerated beta blockers in the past due to wheezing, would consider starting low dose of beta-1 specific bisoprolol when respiratory status improved. Would not start  yet. - With fall in EF, would plan left/right heart cath once she is extubated and stable.  2. Acute hypoxemic respiratory failure: Initially thought multifactorial due to pulmonary edema, effusions, BOOP and OHS.  She was on IV Solumedrol, now prednisone given BOOP with ?component of exacerbation.  She was diuresed.  At baseline, she is on 2L home oxygen.  Extubated 11/4 but developed respiratory distress 11/6 and re-intubated.  She had had swelling of the upper airways noted by speech/swallow exam.  My suspicion here is aspiration versus HCAP.  She is now on broad spectrum antibiotics.  - Continue abx => ceftazidime/vancomycin. - Steroids per primarily service, currently on prednisone.  Cannot rule out flare of BOOP.  - As above, I do not think she is markedly volume elevated but exam very difficult for volume.  Needs central access to follow CVP, CVL versus PICC.   3. CAD: h/o DES to LAD and RCA in 2011.  No chest pain but admitted with severe dyspnea/volume overload and fall in EF on echo.  Troponin mildly elevated but no trend. Suspect most likely demand ischemia from volume overload.  - Given fall in EF, would plan cath when creatinine stabilizes [facility]). Had planned today but re-intubated so will hold off but will try to get central access to follow CVP.  4.  AKI: BUN/creatinine elevated but stable.  5. HTN: If BP runs high, would restart hydralazine/nitrates.   CRITICAL CARE Performed by: Loralie Champagne  Total critical care time: 35 minutes  Critical care time was exclusive of separately billable procedures and treating other patients.  Critical care was necessary to treat or prevent imminent or life-threatening deterioration.  Critical care was time spent personally by me on the following activities: development of treatment plan with patient and/or surrogate as well as nursing, discussions with consultants, evaluation of patient's response to treatment, examination of patient, obtaining  history from patient or surrogate, ordering and performing treatments and interventions, ordering and review of laboratory studies, ordering and  review of radiographic studies, pulse oximetry and re-evaluation of patient's condition.  Length of Stay: 7  Loralie Champagne, MD  06/02/2017, 7:37 AM  Advanced Heart Failure Team Pager [phone] (M-F; 7a - 4p)  Please contact [facility] for night-coverage after hours (4p -7a ) and weekends on [email or web address]

## 2017-06-02 NOTE — Progress Notes (Signed)
PULMONARY / CRITICAL CARE MEDICINE   Name: Kelli Velasquez MRN: [id number] DOB: [birth year]    ADMISSION DATE:  05/26/2017 CONSULTATION DATE:  Admitted 10/31 > extubated 11/4 and to SDU 11/5. 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  She had significant respiratory distress in ER and failed trial bipap requiring intubation.  Initial w/u revealed BNP >700, WBC 21.4 (chronic steroids), hypercarbic respiratory failure with pH 7.211, PCO2 99.4. She required intubation and responded well to diuresis. She was extubated 11/4 and transferred out to SDU the following day. Further diuresis limited by arrhythmia and electrolyte abnormality. Failed swallow eval.    PAST MEDICAL HISTORY :  She  has a past medical history of Allergy, Asthma, Chronic bronchitis ([place]), Coronary artery disease, Dyspnea, Full dentures, Gallstones, GERD (gastroesophageal reflux disease), History of echocardiogram, History of nuclear stress test, Hyperlipidemia, Hypertension, Myocardial infarction ([place]), Myopia, Neoplasm of skin, Nocturnal oxygen desaturation, Obesity, Osteoarthritis, Overactive bladder, Oxygen deficiency, Rash, Retaining fluid, Urinary incontinence, Vertigo, and Wears glasses.  PAST SURGICAL HISTORY: She  has a past surgical history that includes Total knee arthroplasty (2010); vocal cord polypectomy; Cholecystectomy (92); Joint replacement (2007); Dilation and curettage of uterus (1984); Colonoscopy; Coronary angioplasty with stent (07/2009); CATARACT EXTRACTION PHACO AND INTRAOCULAR LENS PLACEMENT (IOC)  Left  Complicated (Left, 5/78/4696); CATARACT EXTRACTION PHACO AND INTRAOCULAR LENS PLACEMENT (IOC)  right (Right, 12/16/2016); VIDEO BRONCHOSCOPY WITH FLUORO (Bilateral, 07/05/2013);  HERNIA REPAIR INFRAUMILICAL INCISIONAL (N/A, 05/16/2013); and INSERTION OF MESH (N/A, 05/16/2013).  Allergies  Allergen Reactions  . Fish Allergy Anaphylaxis  . Shellfish Allergy Anaphylaxis  . Aspirin     REACTION: nausea and vomiting High doses  . Atorvastatin     REACTION: leg pain  . Crestor [Rosuvastatin Calcium] Other (See Comments)    Muscle pain - allergy/intolerance  . Penicillins     Due to mold allergy per pt  . Simvastatin     REACTION: muscle pain  . Trandolapril     REACTION: leg pain    No current facility-administered medications on file prior to encounter.    Current Outpatient Medications on File Prior to Encounter  Medication Sig  . acetaminophen (TYLENOL) 325 MG tablet Take 650 mg by mouth every 6 (six) hours as needed for mild pain. Take as needed per bottle   . acetaminophen-codeine (TYLENOL #3) 300-30 MG tablet One every 4 hours as needed for cough  . amLODipine (NORVASC) 10 MG tablet Take 1 tablet (10 mg total) by mouth daily.  Marland Kitchen aspirin 81 MG tablet Take 81 mg by mouth daily.  . chlorpheniramine (CHLOR-TRIMETON) 4 MG tablet Take 4 mg by mouth every morning.   . cromolyn (NASALCROM) 5.2 MG/ACT nasal spray Place 2 sprays into both nostrils daily as needed for allergies.  . cyanocobalamin (,VITAMIN B-12,) 1000 MCG/ML injection Inject 1,000 mcg into the muscle every 3 (three) months.  . Cyanocobalamin (VITAMIN B12) 3000 MCG SUBL Place 1 capsule under the tongue daily.  Marland Kitchen dextromethorphan-guaiFENesin (MUCINEX DM) 30-600 MG 12hr tablet Take 1 tablet by mouth 2 (two) times daily as needed for cough.  . EPIPEN 2-PAK 0.3 MG/0.3ML SOAJ injection INJECT 0.3 MLS (0.3 MG TOTAL) INTO THE MUSCLE ONCE AS NEEDED FOR ALLERGIC REACTION  . esomeprazole (NEXIUM) 40 MG capsule Take 1 capsule (  40 mg total) by mouth daily.  Marland Kitchen ezetimibe (ZETIA) 10 MG tablet TAKE 1 TABLET (10 MG TOTAL) BY MOUTH DAILY.  . furosemide (LASIX) 20 MG tablet TAKE 1 TABLET BY MOUTH EVERY DAY  .  furosemide (LASIX) 40 MG tablet Take 1 tablet (40 mg total) by mouth daily. Take 1 daily as needed for extra swelling (Patient taking differently: Take 40 mg by mouth daily as needed for fluid. Take 1 daily as needed for extra swelling )  . montelukast (SINGULAIR) 10 MG tablet Take 1 tablet (10 mg total) by mouth at bedtime.  . Multiple Vitamin (MULTIVITAMIN) capsule Take 1 capsule by mouth daily.  . OXYGEN Inhale 3 L into the lungs continuous. 24/7 2 lpm  [employer]  . potassium chloride SA (KLOR-CON M20) 20 MEQ tablet Take 1 tablet (20 mEq total) by mouth daily as needed. (Patient taking differently: Take 20 mEq by mouth daily. )  . PREDNISONE PO Take 15-20 mg by mouth daily. Take 20 mg one day and 15 mg the next days  . PROAIR HFA 108 (90 BASE) MCG/ACT inhaler INHALE 2 PUFFS BY MOUTH EVERY 4 HOURS AS NEEDED FOR WHEEZING OR FOR SHORTNESS OF BREATH  . ranitidine (ZANTAC) 75 MG tablet Take 300 mg by mouth at bedtime.     FAMILY HISTORY:  Her indicated that her mother is deceased. She indicated that her father is deceased. She indicated that the status of her maternal grandmother is unknown. She indicated that the status of her neg hx is unknown.   SOCIAL HISTORY: She  reports that she quit smoking about 34 years ago. Her smoking use included cigarettes. She has a 25.00 pack-year smoking history. she has never used smokeless tobacco. She reports that she drinks alcohol. She reports that she does not use drugs.  REVIEW OF SYSTEMS:   Intubated and sedated, unable to obtain.   SUBJECTIVE:  Patient sedated on vent, unable to provide history.   VITAL SIGNS: BP (!) 126/100   Pulse (!) 31   Temp 97.8 F (36.6 C) (Oral)   Resp 18   Ht _0  (1.626 m)   Wt 235 lb 7.2 oz (106.8 kg)   LMP 07/27/1980   SpO2 100%   BMI 40.42 kg/m   HEMODYNAMICS:    VENTILATOR SETTINGS: Vent Mode: PRVC FiO2 (%):  [40 %-100 %] 60 % Set Rate:  [18 bmp-22 bmp] 18 bmp Vt Set:  [460 mL] 460 mL PEEP:  [5 cmH20] 5  cmH20 Plateau Pressure:  [18 cmH20] 18 cmH20  INTAKE / OUTPUT: I/O last 3 completed shifts: In: 860 [P.O.:830; I.V.:30] Out: 2590 [Urine:2590]  PHYSICAL EXAMINATION: General:  Obese female lying in bed, on vent.  Neuro:  Minimally responsive.  HEENT:  Normocephalic atraumatic, unable to appreciate JVD 2/2 body habitus Cardiovascular:  Irregular rhythm, normal rate, no MRG Lungs:  Coarse breath sounds throughout with diminished bases, no wheezing.  Abdomen:  SNTND, +BS Musculoskeletal:  WWP, minimal BLE.  Skin:  Diffuse bruising in various stages of healing on arms (likely 2/2 lab draws)  LABS:  BMET Recent Labs  Lab 06/01/17 0213 06/02/17 0159 06/02/17 0346  NA 144 149* 146*  K 3.4* 3.4* 3.8  CL 96* 98* 102  CO2 36* 38* 28  BUN 59* 61* 66*  CREATININE 1.31* 1.35* 1.35*  GLUCOSE 144* 154* 162*    Electrolytes Recent Labs  Lab 05/31/17 0311 06/01/17 0213 06/02/17 0159 06/02/17 0340 06/02/17 0346  CALCIUM 9.7 9.6 9.6  --  8.8*  MG 2.5* 2.3  --  2.2  --   PHOS 4.8* 3.7 4.6 3.9  --     CBC Recent Labs  Lab 06/01/17 0213 06/02/17 0159 06/02/17 0346  WBC 23.0* 26.3* 18.6*  HGB 14.2 14.4 13.2  HCT 47.6* 49.0* 44.3  PLT 217 269 198    Coag's Recent Labs  Lab 06/02/17 0159  INR 1.11    Sepsis Markers Recent Labs  Lab 05/26/17 1124  PROCALCITON <0.10    ABG Recent Labs  Lab 05/30/17 0330 06/02/17 0140 06/02/17 0400  PHART 7.477* 7.291* 7.482*  PCO2ART 54.8* 85.6* 47.1  PO2ART 69.1* 82.9* 266*    Liver Enzymes Recent Labs  Lab 06/01/17 0213 06/02/17 0159 06/02/17 0346  AST  --   --  20  ALT  --   --  24  ALKPHOS  --   --  64  BILITOT  --   --  1.2  ALBUMIN 3.2* 3.3* 3.0*    Cardiac Enzymes Recent Labs  Lab 05/30/17 1025 05/30/17 1507 06/02/17 0346  TROPONINI 0.08* 0.22* 0.06*    Glucose Recent Labs  Lab 06/01/17 0615 06/01/17 1132 06/01/17 1632 06/01/17 2144 06/02/17 0226 06/02/17 0345  GLUCAP 163* 206* 202* 144*  163* 157*    Imaging Dg Chest Port 1 View  Result Date: 06/02/2017 CLINICAL DATA:  Shortness of breath EXAM: PORTABLE CHEST 1 VIEW COMPARISON:  05/31/2017 FINDINGS: Shallow inspiration with atelectasis or consolidation in both lung bases. Probable bilateral pleural effusions. Cardiac enlargement. Pulmonary vascularity is normal. No pneumothorax. Calcification of the aorta. IMPRESSION: Shallow inspiration with infiltration or atelectasis in the lung bases. Probable small pleural effusions. Cardiac enlargement. Electronically Signed   By: Lucienne Capers M.D.   On: 06/02/2017 01:12   Dg Abd Portable 1v  Result Date: 06/02/2017 CLINICAL DATA:  OG tube placement EXAM: PORTABLE ABDOMEN - 1 VIEW COMPARISON:  05/29/2017 FINDINGS: Enteric tube tip is in the left upper quadrant consistent with location in the body of the stomach. Gas-filled small and large bowel without distention may indicate ileus. Surgical clips in the right upper quadrant. IMPRESSION: Enteric tube tip is in the left upper quadrant consistent with location in the body of the stomach. Electronically Signed   By: Lucienne Capers M.D.   On: 06/02/2017 04:06     STUDIES:  Echo 10/31>>> EF 35-40%, diffuse hypokinesis CT chest 11/7 read pending > RLL consolidation, no appreciable effusion    CULTURES: BCx2 11/7 > BAL 11/7 >  ANTIBIOTICS: Diflucan (Oral thrush) 11/3>>   Ceftazidine 11/7 > Vancomycin 11/7 >  SIGNIFICANT EVENTS: Admit 10/31 11/4 extubate 11/5 transfer SDU 11/7 transfer ICU reintubated.  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 Improved initially with diuresis and was extuabted successfully, however, two days later 11/7 became hypoxemic/hypercarbic requiring reintubation.   ASSESSMENT / PLAN:  PULMONARY A: Acute on chronic hypercarbic/hypoxemic respiratory  failure Pulmonary edema Hx BOOP - on chronic steroids and 2L [place] at home P:   -Continue steroids at dose of 32m prednisone.  -CT chest to better characterize infiltrates (read pending, appears with RLL consolidation) -Continue budesonide, duoneb, singular -Diuresis directed by advanced CHF service. Will hold for now as renal function has been increasing.   CARDIOVASCULAR A: Acute on chronic systolic and diastolic CHF Net Negative>> 16 L on 11/7. Echo with EF 35-40% and diffuse hypokinesis Troponin elevation on initial admit, decreased 11/7  Hx HTN.  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 P:  -daily CBC  INFECTIOUS A: Possible HCAP vs aspiration purulent secretions noted during intubation. Bilateral infiltrates.  PCT negative. WBC increased, ? hemoconcentration in the setting of diuresis. Oral thrush Plan:  -Start VAP coverage as above (vanc, ceftaz) -Follow BC and BAL, fungal culture, AFB, PJP -Diflucan started 11/3 for oral thrush -Follow fever curve and WBC  ENDOCRINE Hyperglycemia   P: -CBG Q 4   -SSI   NEUROLOGIC A: Acute metabolic encephalopathy:  Hypoxemic, hypercarbia P: -Ventilate to correct metabolic abnormalities -Fentanyl infusion and PRN for sedation/analgesia  FAMILY  - Updates: husband updated of status change - Inter-disciplinary family meet or Palliative Care meeting due by: 11/13   Ralene Ok, MD PGY-2, [facility] 06/02/2017 6:39 AM

## 2017-06-02 NOTE — Progress Notes (Addendum)
Pt O2 sats started going down to 70s while on nasal cannula 5 L O2 and seen breathing heavily through her mouth.  She was put on a high flow [place] at 12 L O2.  Pt became increasingly agitated and combative at times.  On call physician was informed and ordered a chest x-ray and 20 mg lasix IV once. Will continue to monitor.  Lupita Dawn, RN

## 2017-06-02 NOTE — Progress Notes (Signed)
CRITICAL VALUE ALERT  Critical Value:  PCO2 85.6  Date & Time Notied:  06/02/17 at 02:20  Provider Notified: Hal Hope, MD  Orders Received/Actions taken: Place on BiPap

## 2017-06-02 NOTE — Procedures (Signed)
Bronchoscopy Procedure Note Kelli Velasquez [id number] [birth year]  Procedure: Bronchoscopy Indications: Diagnostic evaluation of the airways and Obtain specimens for culture and/or other diagnostic studies  Procedure Details Consent: Unable to obtain consent because of emergent medical necessity. Time Out: Verified patient identification, verified procedure, site/side was marked, verified correct patient position, special equipment/implants available, medications/allergies/relevent history reviewed, required imaging and test results available. Time out performed.   In preparation for procedure, bronchoscope lubricated and lidocaine given via ETT (9cc of 1% lidocaine ml). Sedation: Fentanyl and Versed  Airway entered and the following bronchi were examined: RUL, RML, RLL, LUL and LLL.    Procedure:  Patient was already intubated prior to bronchoscopy. Patient given fentanyl and versed for sedation. Vital signs monitored throughout procedure. Bronchoscope passed down ETT to carina where moderate amount of thick white creamy secretions were suctioned and removed. Bronchoscope then passed to the carina where lidocaine was applied to the carina as well as left and right mainstem bronchci for anesthesia. Bronchoscope then passed down left mainstem, exploring LUL, Lingula, LLL, and Superior segment of LLL, all of which were patent with no secretions or endobronchial lesions/obstructions. Bronchoscope then withdrawn back to level of carina and passed down right mainstem bronchus and into RUL, then RLL, Superior segment of RLL, and finally RML. The airways were all patent with scant purulent secretions within RML only other wise clear; No endobronchial lesions or obstructions. BAL performed within medial segment of RML with 120cc administered and 18cc returned. The fluid was clear to slightly cloudy in appearance. It was sent for microbiology and pathology. Following procedure the bronchoscope was removed from  ETT. Patient continued mechanical ventilation as planned post-procedure. Tolerated procedure well with no complications.   Evaluation Hemodynamic Status: BP stable throughout; O2 sats: stable throughout Patient's Current Condition: stable Specimens:  Sent serosanguinous fluid Complications: No apparent complications Patient did tolerate procedure well.   [name] 06/02/2017

## 2017-06-02 NOTE — Progress Notes (Signed)
Peripherally Inserted Central Catheter/Midline Placement  The IV Nurse has discussed with the patient and/or persons authorized to consent for the patient, the purpose of this procedure and the potential benefits and risks involved with this procedure.  The benefits include less needle sticks, lab draws from the catheter, and the patient may be discharged home with the catheter. Risks include, but not limited to, infection, bleeding, blood clot (thrombus formation), and puncture of an artery; nerve damage and irregular heartbeat and possibility to perform a PICC exchange if needed/ordered by physician.  Alternatives to this procedure were also discussed.  Bard Power PICC patient education guide, fact sheet on infection prevention and patient information card has been provided to patient /or left at bedside.    PICC/Midline Placement Documentation    Consent obtained by husband at bedside .    Kelli Velasquez 06/02/2017, 11:48 AM

## 2017-06-02 NOTE — Progress Notes (Signed)
Nutrition Consult / Follow-up  DOCUMENTATION CODES:   Morbid obesity  INTERVENTION:    Vital High Protein at 60 ml/h (1440 ml per day)   Provides 1440 kcal, 126 gm protein, 1204 ml free water daily  NUTRITION DIAGNOSIS:   Inadequate oral intake related to inability to eat as evidenced by NPO status.  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  Patient was extubated on 11/4. Required re-intubation and transfer to the ICU on 11/7. Received MD Consult for TF initiation and management. Patient is currently intubated on ventilator support MV: 8 L/min Temp (24hrs), Avg:98 F (36.7 C), Min:97.8 F (36.6 C), Max:98.1 F (36.7 C)   Labs reviewed. Sodium 149 (H), potassium 3.4 (L), phos and mag WNL CBG's: [phone] Medications reviewed and include Solumedrol.  Diet Order:  Diet NPO time specified  EDUCATION NEEDS:   No education needs have been identified at this time  Skin:  Skin Assessment: Reviewed RN Assessment  Last BM:  11/6 (type 5)  Height:   Ht Readings from Last 1 Encounters:  05/31/17 5\' 4"  (1.626 m)    Weight:   Wt Readings from Last 1 Encounters:  06/02/17 235 lb 7.2 oz (106.8 kg)    Ideal Body Weight:  54.5 kg  BMI:  Body mass index is 40.42 kg/m.  Estimated Nutritional Needs:   Kcal:  1200-1500  Protein:  120-136 gm  Fluid:  1.6-1.8 L   Molli Barrows, RD, LDN, CNSC Pager [phone] After Hours Pager [phone]

## 2017-06-02 NOTE — Progress Notes (Signed)
Transported patient to CT and back to 2M11 without any complications.

## 2017-06-03 ENCOUNTER — Inpatient Hospital Stay (HOSPITAL_COMMUNITY): Payer: Medicare Other

## 2017-06-03 LAB — CBC WITH DIFFERENTIAL/PLATELET
Basophils Absolute: 0 10*3/uL (ref 0.0–0.1)
Basophils Relative: 0 %
EOS ABS: 0 10*3/uL (ref 0.0–0.7)
EOS PCT: 0 %
HCT: 39.8 % (ref 36.0–46.0)
Hemoglobin: 11.6 g/dL — ABNORMAL LOW (ref 12.0–15.0)
LYMPHS ABS: 0.8 10*3/uL (ref 0.7–4.0)
Lymphocytes Relative: 5 %
MCH: 29.3 pg (ref 26.0–34.0)
MCHC: 29.1 g/dL — AB (ref 30.0–36.0)
MCV: 100.5 fL — ABNORMAL HIGH (ref 78.0–100.0)
MONOS PCT: 1 %
Monocytes Absolute: 0.3 10*3/uL (ref 0.1–1.0)
Neutro Abs: 16.3 10*3/uL — ABNORMAL HIGH (ref 1.7–7.7)
Neutrophils Relative %: 94 %
PLATELETS: 147 10*3/uL — AB (ref 150–400)
RBC: 3.96 MIL/uL (ref 3.87–5.11)
RDW: 15 % (ref 11.5–15.5)
WBC: 17.4 10*3/uL — AB (ref 4.0–10.5)

## 2017-06-03 LAB — GLUCOSE, CAPILLARY
GLUCOSE-CAPILLARY: 218 mg/dL — AB (ref 65–99)
GLUCOSE-CAPILLARY: 238 mg/dL — AB (ref 65–99)
GLUCOSE-CAPILLARY: 239 mg/dL — AB (ref 65–99)
GLUCOSE-CAPILLARY: 296 mg/dL — AB (ref 65–99)
GLUCOSE-CAPILLARY: 311 mg/dL — AB (ref 65–99)
Glucose-Capillary: 282 mg/dL — ABNORMAL HIGH (ref 65–99)
Glucose-Capillary: 249 mg/dL — ABNORMAL HIGH (ref 65–99)

## 2017-06-03 LAB — BASIC METABOLIC PANEL
Anion gap: 8 (ref 5–15)
Anion gap: 9 (ref 5–15)
BUN: 75 mg/dL — AB (ref 6–20)
BUN: 70 mg/dL — AB (ref 6–20)
CALCIUM: 9.2 mg/dL (ref 8.9–10.3)
CHLORIDE: 107 mmol/L (ref 101–111)
CO2: 35 mmol/L — ABNORMAL HIGH (ref 22–32)
CO2: 34 mmol/L — ABNORMAL HIGH (ref 22–32)
CREATININE: 1.33 mg/dL — AB (ref 0.44–1.00)
Calcium: 8.8 mg/dL — ABNORMAL LOW (ref 8.9–10.3)
Chloride: 105 mmol/L (ref 101–111)
Creatinine, Ser: 1.39 mg/dL — ABNORMAL HIGH (ref 0.44–1.00)
GFR calc Af Amer: 41 mL/min — ABNORMAL LOW (ref >60)
GFR calc Af Amer: 43 mL/min — ABNORMAL LOW (ref >60)
GFR calc non Af Amer: 37 mL/min — ABNORMAL LOW (ref >60)
GFR, EST NON AFRICAN AMERICAN: 36 mL/min — AB (ref >60)
GLUCOSE: 233 mg/dL — AB (ref 65–99)
Glucose, Bld: 316 mg/dL — ABNORMAL HIGH (ref 65–99)
Potassium: 3.3 mmol/L — ABNORMAL LOW (ref 3.5–5.1)
Potassium: 3.5 mmol/L (ref 3.5–5.1)
SODIUM: 150 mmol/L — AB (ref 135–145)
Sodium: 148 mmol/L — ABNORMAL HIGH (ref 135–145)

## 2017-06-03 LAB — PNEUMOCYSTIS JIROVECI SMEAR BY DFA: PNEUMOCYSTIS JIROVECI AG: NEGATIVE

## 2017-06-03 LAB — MAGNESIUM
MAGNESIUM: 2.5 mg/dL — AB (ref 1.7–2.4)
Magnesium: 2.2 mg/dL (ref 1.7–2.4)

## 2017-06-03 LAB — LACTIC ACID, PLASMA: Lactic Acid, Venous: 2.4 mmol/L (ref 0.5–1.9)

## 2017-06-03 LAB — PHOSPHORUS
PHOSPHORUS: 2.2 mg/dL — AB (ref 2.5–4.6)
Phosphorus: 1.8 mg/dL — ABNORMAL LOW (ref 2.5–4.6)

## 2017-06-03 LAB — PROCALCITONIN

## 2017-06-03 LAB — ACID FAST SMEAR (AFB, MYCOBACTERIA): Acid Fast Smear: NEGATIVE

## 2017-06-03 MED ORDER — FUROSEMIDE 10 MG/ML IJ SOLN
40.0000 mg | Freq: Once | INTRAMUSCULAR | Status: AC
  Administered 2017-06-03: 40 mg via INTRAVENOUS
  Filled 2017-06-03: qty 4

## 2017-06-03 MED ORDER — HYDRALAZINE HCL 10 MG PO TABS
10.0000 mg | ORAL_TABLET | Freq: Three times a day (TID) | ORAL | Status: DC
  Filled 2017-06-03: qty 1

## 2017-06-03 MED ORDER — INSULIN GLARGINE 100 UNIT/ML ~~LOC~~ SOLN
30.0000 [IU] | Freq: Every day | SUBCUTANEOUS | Status: DC
  Administered 2017-06-03: 30 [IU] via SUBCUTANEOUS
  Filled 2017-06-03 (×2): qty 0.3

## 2017-06-03 MED ORDER — POTASSIUM CHLORIDE 10 MEQ/50ML IV SOLN
INTRAVENOUS | Status: AC
Start: 2017-06-03 — End: 2017-06-03
  Administered 2017-06-03: 10 meq via INTRAVENOUS
  Filled 2017-06-03: qty 50

## 2017-06-03 MED ORDER — POTASSIUM CHLORIDE 10 MEQ/50ML IV SOLN
10.0000 meq | INTRAVENOUS | Status: AC
  Administered 2017-06-03 (×6): 10 meq via INTRAVENOUS
  Filled 2017-06-03 (×5): qty 50

## 2017-06-03 MED ORDER — POTASSIUM PHOSPHATES 15 MMOLE/5ML IV SOLN
30.0000 mmol | Freq: Once | INTRAVENOUS | Status: AC
  Administered 2017-06-03: 30 mmol via INTRAVENOUS
  Filled 2017-06-03: qty 10

## 2017-06-03 NOTE — Progress Notes (Signed)
[facility] Progress Note Patient Name: Kelli Velasquez DOB: Sep 26, 1940 MRN: [id number]   Date of Service  06/03/2017  HPI/Events of Note  Hypophos and moderately low potassium  eICU Interventions  Phos and potassium replaced     Intervention Category Intermediate Interventions: Electrolyte abnormality - evaluation and management  Breniyah Romm 06/03/2017, 10:02 PM

## 2017-06-03 NOTE — Progress Notes (Signed)
PULMONARY / CRITICAL CARE MEDICINE   Name: Kelli Velasquez MRN: [id number] DOB: 1941/06/22    ADMISSION DATE:  05/26/2017 CONSULTATION DATE:  Admitted 10/31 > extubated 11/4 and to SDU 11/5. 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  She had significant respiratory distress in ER and failed trial bipap requiring intubation.  Initial w/u revealed BNP >700, WBC 21.4 (chronic steroids), hypercarbic respiratory failure with pH 7.211, PCO2 99.4. She required intubation and responded well to diuresis. She was extubated 11/4 and transferred out to SDU the following day. Further diuresis limited by arrhythmia and electrolyte abnormality. Failed swallow eval.    PAST MEDICAL HISTORY :  She  has a past medical history of Allergy, Asthma, Chronic bronchitis ([place]), Coronary artery disease, Dyspnea, Full dentures, Gallstones, GERD (gastroesophageal reflux disease), History of echocardiogram, History of nuclear stress test, Hyperlipidemia, Hypertension, Myocardial infarction ([place]), Myopia, Neoplasm of skin, Nocturnal oxygen desaturation, Obesity, Osteoarthritis, Overactive bladder, Oxygen deficiency, Rash, Retaining fluid, Urinary incontinence, Vertigo, and Wears glasses.  PAST SURGICAL HISTORY: She  has a past surgical history that includes Total knee arthroplasty (2010); vocal cord polypectomy; Cholecystectomy (92); Joint replacement (2007); Dilation and curettage of uterus (1984); Colonoscopy; Coronary angioplasty with stent (07/2009); CATARACT EXTRACTION PHACO AND INTRAOCULAR LENS PLACEMENT (IOC)  Left  Complicated (Left, 7/82/9562); CATARACT EXTRACTION PHACO AND INTRAOCULAR LENS PLACEMENT (IOC)  right (Right, 12/16/2016); VIDEO BRONCHOSCOPY WITH FLUORO (Bilateral, 07/05/2013);  HERNIA REPAIR INFRAUMILICAL INCISIONAL (N/A, 05/16/2013); and INSERTION OF MESH (N/A, 05/16/2013).  Allergies  Allergen Reactions  . Fish Allergy Anaphylaxis  . Shellfish Allergy Anaphylaxis  . Aspirin     REACTION: nausea and vomiting High doses  . Atorvastatin     REACTION: leg pain  . Crestor [Rosuvastatin Calcium] Other (See Comments)    Muscle pain - allergy/intolerance  . Penicillins     Due to mold allergy per pt  . Simvastatin     REACTION: muscle pain  . Trandolapril     REACTION: leg pain    No current facility-administered medications on file prior to encounter.    Current Outpatient Medications on File Prior to Encounter  Medication Sig  . acetaminophen (TYLENOL) 325 MG tablet Take 650 mg by mouth every 6 (six) hours as needed for mild pain. Take as needed per bottle   . acetaminophen-codeine (TYLENOL #3) 300-30 MG tablet One every 4 hours as needed for cough  . amLODipine (NORVASC) 10 MG tablet Take 1 tablet (10 mg total) by mouth daily.  Marland Kitchen aspirin 81 MG tablet Take 81 mg by mouth daily.  . chlorpheniramine (CHLOR-TRIMETON) 4 MG tablet Take 4 mg by mouth every morning.   . cromolyn (NASALCROM) 5.2 MG/ACT nasal spray Place 2 sprays into both nostrils daily as needed for allergies.  . cyanocobalamin (,VITAMIN B-12,) 1000 MCG/ML injection Inject 1,000 mcg into the muscle every 3 (three) months.  . Cyanocobalamin (VITAMIN B12) 3000 MCG SUBL Place 1 capsule under the tongue daily.  Marland Kitchen dextromethorphan-guaiFENesin (MUCINEX DM) 30-600 MG 12hr tablet Take 1 tablet by mouth 2 (two) times daily as needed for cough.  . EPIPEN 2-PAK 0.3 MG/0.3ML SOAJ injection INJECT 0.3 MLS (0.3 MG TOTAL) INTO THE MUSCLE ONCE AS NEEDED FOR ALLERGIC REACTION  . esomeprazole (NEXIUM) 40 MG capsule Take 1 capsule (  40 mg total) by mouth daily.  Marland Kitchen ezetimibe (ZETIA) 10 MG tablet TAKE 1 TABLET (10 MG TOTAL) BY MOUTH DAILY.  . furosemide (LASIX) 20 MG tablet TAKE 1 TABLET BY MOUTH EVERY DAY  .  furosemide (LASIX) 40 MG tablet Take 1 tablet (40 mg total) by mouth daily. Take 1 daily as needed for extra swelling (Patient taking differently: Take 40 mg by mouth daily as needed for fluid. Take 1 daily as needed for extra swelling )  . montelukast (SINGULAIR) 10 MG tablet Take 1 tablet (10 mg total) by mouth at bedtime.  . Multiple Vitamin (MULTIVITAMIN) capsule Take 1 capsule by mouth daily.  . OXYGEN Inhale 3 L into the lungs continuous. 24/7 2 lpm  [employer]  . potassium chloride SA (KLOR-CON M20) 20 MEQ tablet Take 1 tablet (20 mEq total) by mouth daily as needed. (Patient taking differently: Take 20 mEq by mouth daily. )  . PREDNISONE PO Take 15-20 mg by mouth daily. Take 20 mg one day and 15 mg the next days  . PROAIR HFA 108 (90 BASE) MCG/ACT inhaler INHALE 2 PUFFS BY MOUTH EVERY 4 HOURS AS NEEDED FOR WHEEZING OR FOR SHORTNESS OF BREATH  . ranitidine (ZANTAC) 75 MG tablet Take 300 mg by mouth at bedtime.     FAMILY HISTORY:  Her indicated that her mother is deceased. She indicated that her father is deceased. She indicated that the status of her maternal grandmother is unknown. She indicated that the status of her neg hx is unknown.  SOCIAL HISTORY: She  reports that she quit smoking about 34 years ago. Her smoking use included cigarettes. She has a 25.00 pack-year smoking history. she has never used smokeless tobacco. She reports that she drinks alcohol. She reports that she does not use drugs.  REVIEW OF SYSTEMS:   Patient unable to provide d/t sedation.   SUBJECTIVE:  Unresponsive on vent.   VITAL SIGNS: BP 140/66   Pulse (!) 40   Temp (!) 100.6 F (38.1 C) (Oral)   Resp 18   Ht _0  (1.626 m)   Wt 237 lb 7 oz (107.7 kg)   LMP 07/27/1980   SpO2 100%   BMI 40.76 kg/m   HEMODYNAMICS: CVP:  [4 mmHg-7 mmHg] 4 mmHg  VENTILATOR SETTINGS: Vent Mode: PRVC FiO2 (%):  [40 %-60 %] 40 % Set Rate:  [18 bmp] 18 bmp Vt Set:  [460 mL] 460 mL PEEP:  [5 cmH20] 5 cmH20 Plateau  Pressure:  [17 cmH20-19 cmH20] 19 cmH20  INTAKE / OUTPUT: I/O last 3 completed shifts: In: 1857.8 [I.V.:490.5; NG/GT:1217.3; IV Piggyback:150] Out: 1925 [Urine:1925]  PHYSICAL EXAMINATION: General:  Obese female lying in bed, on vent.  Neuro:  Minimally responsive 2/2 sedation.  HEENT:  Normocephalic atraumatic, unable to appreciate JVD 2/2 body habitus Cardiovascular:  Irregular rhythm, normal rate, no MRG Lungs:  Coarse breath sounds throughout with diminished bases, no wheezing.  Abdomen:  SNTND, +BS Musculoskeletal:  WWP, minimal BLE.  Skin:  Diffuse bruising in various stages of healing on arms (likely 2/2 lab draws)  LABS:  BMET Recent Labs  Lab 06/02/17 0159 06/02/17 0346 06/03/17 0500  NA 149* 146* 148*  K 3.4* 3.8 3.3*  CL 98* 102 105  CO2 38* 28 35*  BUN 61* 66* 75*  CREATININE 1.35* 1.35* 1.39*  GLUCOSE 154* 162* 233*    Electrolytes Recent Labs  Lab 06/02/17 0159 06/02/17 0340 06/02/17 0346 06/02/17 1833 06/03/17 0500  CALCIUM 9.6  --  8.8*  --  9.2  MG  --  2.2  --  2.5* 2.5*  PHOS 4.6 3.9  --  1.7* 2.2*    CBC Recent Labs  Lab 06/01/17 0213 06/02/17 0159 06/02/17 0346  WBC 23.0* 26.3* 18.6*  HGB 14.2 14.4 13.2  HCT 47.6* 49.0* 44.3  PLT 217 269 198    Coag's Recent Labs  Lab 06/02/17 0159  INR 1.11    Sepsis Markers Recent Labs  Lab 06/02/17 0911 06/02/17 1230 06/02/17 1830 06/02/17 2146 06/03/17 0500  LATICACIDVEN  --  1.9 2.3* 2.4*  --   PROCALCITON <0.10  --   --   --  <0.10    ABG Recent Labs  Lab 05/30/17 0330 06/02/17 0140 06/02/17 0400  PHART 7.477* 7.291* 7.482*  PCO2ART 54.8* 85.6* 47.1  PO2ART 69.1* 82.9* 266*    Liver Enzymes Recent Labs  Lab 06/01/17 0213 06/02/17 0159 06/02/17 0346  AST  --   --  20  ALT  --   --  24  ALKPHOS  --   --  64  BILITOT  --   --  1.2  ALBUMIN 3.2* 3.3* 3.0*    Cardiac Enzymes Recent Labs  Lab 06/02/17 0346 06/02/17 0911 06/02/17 1513  TROPONINI 0.06* 0.05*  0.05*    Glucose Recent Labs  Lab 06/02/17 0345 06/02/17 0754 06/02/17 1557 06/02/17 2028 06/03/17 0024 06/03/17 0426  GLUCAP 157* 139* 321* 315* 311* 218*    Imaging No results found.  STUDIES:  Echo 10/31>>> EF 35-40%, diffuse hypokinesis CT chest 11/7 read pending > RLL consolidation, no appreciable effusion    CULTURES: BCx2 11/7 > BAL 11/7 > AFB smear negative  ANTIBIOTICS: Diflucan (Oral thrush) 11/3>>   Ceftazidine 11/7 > Vancomycin 11/7 >  SIGNIFICANT EVENTS: Admit 10/31 11/4 extubate 11/5 transfer SDU 11/7 transfer ICU reintubated.  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 Improved initially with diuresis and was extuabted successfully, however, two days later 11/7 became hypoxemic/hypercarbic requiring reintubation.   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 Echo with EF 35-40% and diffuse hypokinesis Troponin elevation on initial admit, decreased 11/7 Hx HTN.  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 P:  -daily CBC  INFECTIOUS A: Possible HCAP vs aspiration purulent secretions noted during intubation.  Bilateral infiltrates.  PCT negative. WBC increased, ? hemoconcentration in the setting of diuresis. Oral thrush Plan:  -Start VAP coverage as above (vanc, ceftaz) -Follow BC and BAL, fungal culture, AFB, PJP -Diflucan started 11/3 for oral thrush -Follow fever curve and WBC  ENDOCRINE Hyperglycemia   P: -CBG Q 4   -resistant SSI 2/2 steroids  NEUROLOGIC A: Acute metabolic encephalopathy:  Hypoxemic, hypercarbia P: -Ventilate to  correct metabolic abnormalities -Fentanyl infusion and PRN for sedation/analgesia  FAMILY  - Updates: no family at bedside on my exam.  - Inter-disciplinary family meet or Palliative Care meeting due by: 11/13   Ralene Ok, MD PGY-2, [facility] 06/03/2017 7:02 AM

## 2017-06-03 NOTE — Progress Notes (Signed)
Patient ID: Kelli Velasquez, female   DOB: 12-05-1940, 76 y.o.   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 She has not been on bb due to wheezing and bronchitis.  Over 2 weeks prior to admission, she had increased dyspnea with exertion, she was short of breath at rest on the day of admission.  Her husband says she has been taking 20 mg of lasix daily and occasionally an extra 20 mg of lasix.   Presented to [facility] ED with respiratory distress. Family reported increased dyspnea over the last week requiring 2 liters oxygen at home. In ED she received solumedrol and duonebs. Placed on Bipap but continue decline requiring intubation. CXR concerning for interstitial edema and bilateral effusions.  Extubated 11/4. Noted to have swelling of upper airways on speech/swallow evaluation 11/6.  Developed respiratory distress 11/6 in the evening with hypoxia and hypercapnia and was re-intubated.    Intubated and sedated. Intermittently follows commands.  CXR this am with bibasilar opacities suggesting left pleural effusion and atelectasis.   Studies:  Echo 05/26/2017 EF 35-40%  Grade I DD RV normal Echo 2017 EF 45-50% with WMA Myoview 2017 No significant ischemia EF 48%.  [place] 2011- DES LAD and RCA.   Objective:   Weight Range: 237 lb 7 oz (107.7 kg) Body mass index is 40.76 kg/m.   Vital Signs:   Temp:  [97.6 F (36.4 C)-101.4 F (38.6 C)] 100.1 F (37.8 C) (11/08 0805) Pulse Rate:  [30-89] 86 (11/08 0829) Resp:  [18-21] 19 (11/08 0800) BP: (99-140)/(44-82) 133/82 (11/08 0829) SpO2:  [99 %-100 %] 100 % (11/08 0829) FiO2 (%):  [40 %-50 %] 40 % (11/08 0829) Weight:  [237 lb 7 oz (107.7 kg)] 237 lb 7 oz (107.7 kg) (11/08 0500) Last BM Date: 06/01/17  Weight change: Filed Weights   05/31/17 2306 06/02/17 0500 06/03/17 0500    Weight: 247 lb 12.8 oz (112.4 kg) 235 lb 7.2 oz (106.8 kg) 237 lb 7 oz (107.7 kg)   Intake/Output:   Intake/Output Summary (Last 24 hours) at 06/03/2017 1003 Last data filed at 06/03/2017 0800 Gross per 24 hour  Intake 2513.67 ml  Output 1475 ml  Net 1038.67 ml    Physical Exam   CVP 8  General: Intubated/sedated.  HEENT: + ETT Neck: Supple. JVP 7-8. Carotids 2+ bilat; no bruits. No thyromegaly or nodule noted. Cor: PMI nondisplaced. RRR, No M/G/R noted Lungs: Diminished basilar sounds Abdomen: Soft, non-tender, non-distended, no HSM. No bruits or masses. +BS  Extremities: No cyanosis, clubbing, or rash. Trace to 1+ peripheral edema.  Neuro: Intubated/sedated  Telemetry   NSR with PACs, personally reviewed.   Labs    CBC Recent Labs    06/01/17 0213 06/02/17 0159 06/02/17 0346  WBC 23.0* 26.3* 18.6*  NEUTROABS 19.3*  --  16.3*  HGB 14.2 14.4 13.2  HCT 47.6* 49.0* 44.3  MCV 100.4* 101.7* 100.7*  PLT 217 269 789   Basic Metabolic Panel Recent Labs    06/02/17 0346 06/02/17 1833 06/03/17 0500  NA 146*  --  148*  K 3.8  --  3.3*  CL 102  --  105  CO2 28  --  35*  GLUCOSE 162*  --  233*  BUN 66*  --  75*  CREATININE 1.35*  --  1.39*  CALCIUM 8.8*  --  9.2  MG  --  2.5* 2.5*  PHOS  --  1.7* 2.2*   Liver Function Tests Recent Labs    06/02/17 0159 06/02/17 0346  AST  --  20  ALT  --  24  ALKPHOS  --  64  BILITOT  --  1.2  PROT  --  6.2*  ALBUMIN 3.3* 3.0*   No results for input(s): LIPASE, AMYLASE in the last 72 hours. Cardiac Enzymes Recent Labs    06/02/17 0346 06/02/17 0911 06/02/17 1513  TROPONINI 0.06* 0.05* 0.05*   BNP: BNP (last 3 results) Recent Labs    05/26/17 0610  BNP 760.9*   ProBNP (last 3 results) Recent Labs    01/11/17 1053  PROBNP 264.0*   D-Dimer No results for input(s): DDIMER in the last 72 hours. Hemoglobin A1C No results for input(s): HGBA1C in the last 72 hours. Fasting Lipid Panel No results for  input(s): CHOL, HDL, LDLCALC, TRIG, CHOLHDL, LDLDIRECT in the last 72 hours. Thyroid Function Tests No results for input(s): TSH, T4TOTAL, T3FREE, THYROIDAB in the last 72 hours.  Invalid input(s): FREET3  Other results:  Imaging   Dg Chest Port 1 View  Result Date: 06/03/2017 CLINICAL DATA:  76 year old female with hypoxia. EXAM: PORTABLE CHEST 1 VIEW COMPARISON:  06/02/2017 FINDINGS: There has been interval intubation, with the endotracheal tube approximately 3 cm above the level of the carina. A right-sided PICC line terminates at the mid SVC. Cardiomediastinal silhouette is enlarged but grossly stable. Note is made of shallow inspiration with bronchovascular crowding. There is a likely left basilar effusion and associated atelectasis. Superimposed consolidation not excluded. Additional right middle lobe opacity, similar to prior exam, also may represent infiltrate. No pneumothorax. No acute osseous abnormalities. IMPRESSION: 1. Interval intubation, with the endotracheal tube approximately 3 cm above the level of the carina. Right-sided PICC. 2. Bibasilar opacities suggesting left pleural effusion and atelectasis, with or without superimposed consolidation. 3. Stable cardiomegaly. Electronically Signed   By: Kristopher Oppenheim M.D.   On: 06/03/2017 07:13    Medications:     Scheduled Medications: . aspirin  81 mg Per Tube Daily  . chlorhexidine gluconate (MEDLINE KIT)  15 mL Mouth Rinse BID  . Chlorhexidine Gluconate Cloth  6 each Topical Q0600  . ezetimibe  10 mg Per Tube QHS  . feeding supplement (VITAL HIGH PROTEIN)  1,000 mL Per Tube Q24H  . free water  60 mL Per Tube Q4H  . guaiFENesin  10 mL Per Tube Q8H  . heparin  5,000 Units Subcutaneous Q8H  . insulin aspart  0-20 Units Subcutaneous Q4H  . insulin glargine  30 Units Subcutaneous Daily  . ipratropium-albuterol  3 mL Nebulization Q6H  . mouth rinse  15 mL Mouth Rinse QID  . methylPREDNISolone (SOLU-MEDROL) injection  40 mg  Intravenous Q6H  . montelukast  10 mg Per Tube QHS  . pantoprazole sodium  40 mg Per Tube Daily  . sodium chloride flush  10-40 mL Intracatheter Q12H    Infusions: . sodium chloride 250 mL (05/30/17 1700)  . sodium chloride    . cefTAZidime (FORTAZ)  IV Stopped (06/02/17 2151)  . fentaNYL infusion INTRAVENOUS 200 mcg/hr (06/03/17 0815)  . potassium chloride      PRN Medications: sodium chloride, albuterol, fentaNYL, midazolam, sodium chloride flush  Patient Profile   Kelli Henery is a 76 year old with a history of CAD DES to LAD and RCA,  HTN, BOOP  on chronic prednisone, and chronic respiratory failure.  Admitted with acute/chronic respiratory failure  Assessment/Plan   1. Acute/chronic systolic CHF: Probably ischemic cardiomyopathy given prior history.  Echo this admission with EF down to 35-40% range, around 50% in past.  She was admitted with worsening dyspnea, elevated BNP, CXR with pulmonary edema. PCT not elevated.  Has history of BOOP.  Volume status improved and extubated 11/4.  However, on 11/6 developed respiratory distress and was intubated again.  My suspicion here is more HCAP versus aspiration versus BOOP flare than pulmonary edema.  She has PICC line now, CVP has not been significantly elevated (8 today).  - We had planned right/left heart cath, but with ongoing significant BUN elevation and re-intubation, will hold off for now.   - CVP 8 this am. Will gently diuresis.  - Losartan on hold with BUN/creatinine elevation.  - Continue spiro 12.5 mg daily. - Use hydralazine/nitrates if BP control is needed.  - She has not tolerated beta blockers in the past due to wheezing, would consider starting low dose of beta-1 specific bisoprolol when respiratory status improved. Would not start yet. - With fall in EF, would plan left/right heart cath once she is extubated and stable.  2. Acute hypoxemic respiratory failure: Initially thought multifactorial due to pulmonary edema,  effusions, BOOP and OHS.  She was on IV Solumedrol, now prednisone given BOOP with ?component of exacerbation.  She was diuresed.  At baseline, she is on 2L home oxygen.  Extubated 11/4 but developed respiratory distress 11/6 and re-intubated.  She had had swelling of the upper airways noted by speech/swallow exam.  My suspicion here is aspiration versus HCAP.  She is now on broad spectrum antibiotics.  - Continue abx => ceftazidime. Vanc stopped this am.  - Steroids per primarily service, currently on prednisone.  Cannot rule out flare of BOOP.  - CVP 8 this am. status looks OK on exam. Will give one dose IV lasix 40 mg to help with weaning vent.  3. CAD: h/o DES to LAD and RCA in 2011.  No chest pain but admitted with severe dyspnea/volume overload and fall in EF on echo.  Troponin mildly elevated but no trend. Suspect most likely demand ischemia from volume overload.  - Given fall in EF, would plan cath when creatinine stabilizes [facility]). Had planned 06/02/17 but re-intubated so will hold off but will try to get central access to follow CVP.  4.  AKI:  - Creatinine stable in 1.3 range. BUN rising.  5. HTN:  - Will watch for now. Can add back low dose hydral as needed.  6. Hypokalemia - Supp complete. Continue to follow.   Length of Stay: [place], [place]  06/03/2017, 10:03 AM  Advanced Heart Failure Team Pager [phone] (M-F; 7a - 4p)  Please contact [facility] for night-coverage after hours (4p -7a ) and weekends on [email or web address]  Patient seen with PA, agree with the above note.  I made adjustments to the note to reflect my thoughts.  Stable today, remains intubated.  Weaning sedation and has followed commands.  PICC line in now, CVP 8 this morning.  Febrile to 101.4 overnight.  Suspect HCAP versus aspiration pneumonitis is the major issue here rather than CHF.  - Continue abx per primary service.  - On Solumedrol for coverage of potential BOOP flare.  - I will give 1 dose  of Lasix 40 mg IV today to clear lungs as much as possible for potential  extubation.   Discussed situation with husband. If we are able to extubate her this time, will make her DNI.   Loralie Champagne 06/03/2017 10:41 AM

## 2017-06-04 LAB — BASIC METABOLIC PANEL
Anion gap: 10 (ref 5–15)
BUN: 64 mg/dL — AB (ref 6–20)
CHLORIDE: 105 mmol/L (ref 101–111)
CO2: 35 mmol/L — ABNORMAL HIGH (ref 22–32)
Calcium: 9.1 mg/dL (ref 8.9–10.3)
Creatinine, Ser: 1.22 mg/dL — ABNORMAL HIGH (ref 0.44–1.00)
GFR calc non Af Amer: 42 mL/min — ABNORMAL LOW (ref >60)
GFR, EST AFRICAN AMERICAN: 48 mL/min — AB (ref >60)
Glucose, Bld: 296 mg/dL — ABNORMAL HIGH (ref 65–99)
POTASSIUM: 3.4 mmol/L — AB (ref 3.5–5.1)
Sodium: 150 mmol/L — ABNORMAL HIGH (ref 135–145)

## 2017-06-04 LAB — CBC WITH DIFFERENTIAL/PLATELET
Basophils Absolute: 0 10*3/uL (ref 0.0–0.1)
Basophils Relative: 0 %
Eosinophils Absolute: 0 10*3/uL (ref 0.0–0.7)
Eosinophils Relative: 0 %
HEMATOCRIT: 40.4 % (ref 36.0–46.0)
HEMOGLOBIN: 11.7 g/dL — AB (ref 12.0–15.0)
LYMPHS ABS: 0.8 10*3/uL (ref 0.7–4.0)
LYMPHS PCT: 5 %
MCH: 29.2 pg (ref 26.0–34.0)
MCHC: 29 g/dL — ABNORMAL LOW (ref 30.0–36.0)
MCV: 100.7 fL — AB (ref 78.0–100.0)
Monocytes Absolute: 0.5 10*3/uL (ref 0.1–1.0)
Monocytes Relative: 3 %
NEUTROS ABS: 16.5 10*3/uL — AB (ref 1.7–7.7)
NEUTROS PCT: 92 %
Platelets: 139 10*3/uL — ABNORMAL LOW (ref 150–400)
RBC: 4.01 MIL/uL (ref 3.87–5.11)
RDW: 15.1 % (ref 11.5–15.5)
WBC: 17.8 10*3/uL — AB (ref 4.0–10.5)

## 2017-06-04 LAB — MAGNESIUM: Magnesium: 2.3 mg/dL (ref 1.7–2.4)

## 2017-06-04 LAB — GLUCOSE, CAPILLARY
GLUCOSE-CAPILLARY: 281 mg/dL — AB (ref 65–99)
GLUCOSE-CAPILLARY: 236 mg/dL — AB (ref 65–99)
GLUCOSE-CAPILLARY: 352 mg/dL — AB (ref 65–99)
Glucose-Capillary: 202 mg/dL — ABNORMAL HIGH (ref 65–99)
Glucose-Capillary: 264 mg/dL — ABNORMAL HIGH (ref 65–99)
Glucose-Capillary: 338 mg/dL — ABNORMAL HIGH (ref 65–99)

## 2017-06-04 LAB — PROCALCITONIN: Procalcitonin: <0.1 ng/mL

## 2017-06-04 MED ORDER — INSULIN GLARGINE 100 UNIT/ML ~~LOC~~ SOLN
45.0000 [IU] | Freq: Every day | SUBCUTANEOUS | Status: DC
  Filled 2017-06-04: qty 0.45

## 2017-06-04 MED ORDER — HYDRALAZINE HCL 10 MG PO TABS
10.0000 mg | ORAL_TABLET | Freq: Three times a day (TID) | ORAL | Status: DC
  Administered 2017-06-04 – 2017-06-07 (×10): 10 mg
  Filled 2017-06-04 (×11): qty 1

## 2017-06-04 MED ORDER — POTASSIUM CHLORIDE 10 MEQ/50ML IV SOLN
10.0000 meq | INTRAVENOUS | Status: AC
  Administered 2017-06-04 (×6): 10 meq via INTRAVENOUS
  Filled 2017-06-04 (×6): qty 50

## 2017-06-04 MED ORDER — DEXMEDETOMIDINE HCL IN NACL 400 MCG/100ML IV SOLN
0.4000 ug/kg/h | INTRAVENOUS | Status: DC
  Administered 2017-06-04: 0.8 ug/kg/h via INTRAVENOUS
  Administered 2017-06-04: 1 ug/kg/h via INTRAVENOUS
  Administered 2017-06-04: 1.001 ug/kg/h via INTRAVENOUS
  Administered 2017-06-05: 1.2 ug/kg/h via INTRAVENOUS
  Administered 2017-06-05: 0.5 ug/kg/h via INTRAVENOUS
  Administered 2017-06-05: 1 ug/kg/h via INTRAVENOUS
  Administered 2017-06-05 (×2): 0.8 ug/kg/h via INTRAVENOUS
  Administered 2017-06-06 (×4): 0.7 ug/kg/h via INTRAVENOUS
  Administered 2017-06-07: 1 ug/kg/h via INTRAVENOUS
  Administered 2017-06-07: 0.8 ug/kg/h via INTRAVENOUS
  Filled 2017-06-04 (×15): qty 100

## 2017-06-04 MED ORDER — ISOSORBIDE DINITRATE 10 MG PO TABS
10.0000 mg | ORAL_TABLET | Freq: Three times a day (TID) | ORAL | Status: DC
  Administered 2017-06-04 – 2017-06-07 (×10): 10 mg
  Filled 2017-06-04 (×10): qty 1

## 2017-06-04 MED ORDER — METHYLPREDNISOLONE SODIUM SUCC 40 MG IJ SOLR
40.0000 mg | Freq: Two times a day (BID) | INTRAMUSCULAR | Status: DC
  Administered 2017-06-04 – 2017-06-09 (×10): 40 mg via INTRAVENOUS
  Filled 2017-06-04 (×10): qty 1

## 2017-06-04 MED ORDER — INSULIN GLARGINE 100 UNIT/ML ~~LOC~~ SOLN
40.0000 [IU] | Freq: Every day | SUBCUTANEOUS | Status: DC
  Administered 2017-06-04: 40 [IU] via SUBCUTANEOUS
  Filled 2017-06-04 (×2): qty 0.4

## 2017-06-04 MED ORDER — SODIUM CHLORIDE 0.9 % IV SOLN
0.4000 ug/kg/h | INTRAVENOUS | Status: DC
  Administered 2017-06-04: 0.4 ug/kg/h via INTRAVENOUS
  Filled 2017-06-04: qty 2

## 2017-06-04 NOTE — Progress Notes (Signed)
PULMONARY / CRITICAL CARE MEDICINE   Name: Kelli Velasquez MRN: [id number] DOB: 1941/06/22    ADMISSION DATE:  05/26/2017 CONSULTATION DATE:  Admitted 10/31 > extubated 11/4 and to SDU 11/5. 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  She had significant respiratory distress in ER and failed trial bipap requiring intubation.  Initial w/u revealed BNP >700, WBC 21.4 (chronic steroids), hypercarbic respiratory failure with pH 7.211, PCO2 99.4. She required intubation and responded well to diuresis. She was extubated 11/4 and transferred out to SDU the following day. Further diuresis limited by arrhythmia and electrolyte abnormality. Failed swallow eval.    PAST MEDICAL HISTORY :  She  has a past medical history of Allergy, Asthma, Chronic bronchitis ([place]), Coronary artery disease, Dyspnea, Full dentures, Gallstones, GERD (gastroesophageal reflux disease), History of echocardiogram, History of nuclear stress test, Hyperlipidemia, Hypertension, Myocardial infarction ([place]), Myopia, Neoplasm of skin, Nocturnal oxygen desaturation, Obesity, Osteoarthritis, Overactive bladder, Oxygen deficiency, Rash, Retaining fluid, Urinary incontinence, Vertigo, and Wears glasses.  PAST SURGICAL HISTORY: She  has a past surgical history that includes Total knee arthroplasty (2010); vocal cord polypectomy; Cholecystectomy (92); Joint replacement (2007); Dilation and curettage of uterus (1984); Colonoscopy; Coronary angioplasty with stent (07/2009); CATARACT EXTRACTION PHACO AND INTRAOCULAR LENS PLACEMENT (IOC)  Left  Complicated (Left, 7/82/9562); CATARACT EXTRACTION PHACO AND INTRAOCULAR LENS PLACEMENT (IOC)  right (Right, 12/16/2016); VIDEO BRONCHOSCOPY WITH FLUORO (Bilateral, 07/05/2013);  HERNIA REPAIR INFRAUMILICAL INCISIONAL (N/A, 05/16/2013); and INSERTION OF MESH (N/A, 05/16/2013).  Allergies  Allergen Reactions  . Fish Allergy Anaphylaxis  . Shellfish Allergy Anaphylaxis  . Aspirin     REACTION: nausea and vomiting High doses  . Atorvastatin     REACTION: leg pain  . Crestor [Rosuvastatin Calcium] Other (See Comments)    Muscle pain - allergy/intolerance  . Penicillins     Due to mold allergy per pt  . Simvastatin     REACTION: muscle pain  . Trandolapril     REACTION: leg pain    No current facility-administered medications on file prior to encounter.    Current Outpatient Medications on File Prior to Encounter  Medication Sig  . acetaminophen (TYLENOL) 325 MG tablet Take 650 mg by mouth every 6 (six) hours as needed for mild pain. Take as needed per bottle   . acetaminophen-codeine (TYLENOL #3) 300-30 MG tablet One every 4 hours as needed for cough  . amLODipine (NORVASC) 10 MG tablet Take 1 tablet (10 mg total) by mouth daily.  Marland Kitchen aspirin 81 MG tablet Take 81 mg by mouth daily.  . chlorpheniramine (CHLOR-TRIMETON) 4 MG tablet Take 4 mg by mouth every morning.   . cromolyn (NASALCROM) 5.2 MG/ACT nasal spray Place 2 sprays into both nostrils daily as needed for allergies.  . cyanocobalamin (,VITAMIN B-12,) 1000 MCG/ML injection Inject 1,000 mcg into the muscle every 3 (three) months.  . Cyanocobalamin (VITAMIN B12) 3000 MCG SUBL Place 1 capsule under the tongue daily.  Marland Kitchen dextromethorphan-guaiFENesin (MUCINEX DM) 30-600 MG 12hr tablet Take 1 tablet by mouth 2 (two) times daily as needed for cough.  . EPIPEN 2-PAK 0.3 MG/0.3ML SOAJ injection INJECT 0.3 MLS (0.3 MG TOTAL) INTO THE MUSCLE ONCE AS NEEDED FOR ALLERGIC REACTION  . esomeprazole (NEXIUM) 40 MG capsule Take 1 capsule (  40 mg total) by mouth daily.  Marland Kitchen ezetimibe (ZETIA) 10 MG tablet TAKE 1 TABLET (10 MG TOTAL) BY MOUTH DAILY.  . furosemide (LASIX) 20 MG tablet TAKE 1 TABLET BY MOUTH EVERY DAY  .  furosemide (LASIX) 40 MG tablet Take 1 tablet (40 mg total) by mouth daily. Take 1 daily as needed for extra swelling (Patient taking differently: Take 40 mg by mouth daily as needed for fluid. Take 1 daily as needed for extra swelling )  . montelukast (SINGULAIR) 10 MG tablet Take 1 tablet (10 mg total) by mouth at bedtime.  . Multiple Vitamin (MULTIVITAMIN) capsule Take 1 capsule by mouth daily.  . OXYGEN Inhale 3 L into the lungs continuous. 24/7 2 lpm  [employer]  . potassium chloride SA (KLOR-CON M20) 20 MEQ tablet Take 1 tablet (20 mEq total) by mouth daily as needed. (Patient taking differently: Take 20 mEq by mouth daily. )  . PREDNISONE PO Take 15-20 mg by mouth daily. Take 20 mg one day and 15 mg the next days  . PROAIR HFA 108 (90 BASE) MCG/ACT inhaler INHALE 2 PUFFS BY MOUTH EVERY 4 HOURS AS NEEDED FOR WHEEZING OR FOR SHORTNESS OF BREATH  . ranitidine (ZANTAC) 75 MG tablet Take 300 mg by mouth at bedtime.     FAMILY HISTORY:  Her indicated that her mother is deceased. She indicated that her father is deceased. She indicated that the status of her maternal grandmother is unknown. She indicated that the status of her neg hx is unknown.  SOCIAL HISTORY: She  reports that she quit smoking about 34 years ago. Her smoking use included cigarettes. She has a 25.00 pack-year smoking history. she has never used smokeless tobacco. She reports that she drinks alcohol. She reports that she does not use drugs.  REVIEW OF SYSTEMS:   Patient sleeping peacefully, sedated.   SUBJECTIVE:  Unable to provide 2/2 sedation and intubation.   VITAL SIGNS: BP 129/60   Pulse 82   Temp 99.7 F (37.6 C) (Oral)   Resp 20   Ht _0  (1.626 m)   Wt 240 lb 8.4 oz (109.1 kg)   LMP 07/27/1980   SpO2 99%   BMI 41.29 kg/m   HEMODYNAMICS: CVP:  [4 mmHg-10 mmHg] 7 mmHg  VENTILATOR SETTINGS: Vent Mode: PRVC FiO2 (%):  [40 %] 40 % Set Rate:  [18 bmp-20 bmp] 20 bmp Vt Set:  [460 mL] 460 mL PEEP:  [5 cmH20]  5 cmH20 Plateau Pressure:  [17 cmH20] 17 cmH20  INTAKE / OUTPUT: I/O last 3 completed shifts: In: 4443.7 [I.V.:1593.7; NG/GT:2100; IV Piggyback:750] Out: 3220 [Urine:3275]  PHYSICAL EXAMINATION: General:  Obese female lying in bed, on vent.  Neuro:  Minimally responsive 2/2 sedation.  HEENT:  Normocephalic atraumatic, unable to appreciate JVD 2/2 body habitus Cardiovascular:  Irregular rhythm, normal rate, no MRG Lungs:  Good air movement, coarse breath sounds throughout.  Abdomen:  SNTND, +BS Musculoskeletal:  WWP, minimal BLE.  Skin:  Intact, warm, well perfused.   LABS:  BMET Recent Labs  Lab 06/03/17 0500 06/03/17 1838 06/04/17 0540  NA 148* 150* 150*  K 3.3* 3.5 3.4*  CL 105 107 105  CO2 35* 34* 35*  BUN 75* 70* 64*  CREATININE 1.39* 1.33* 1.22*  GLUCOSE 233* 316* 296*    Electrolytes Recent Labs  Lab 06/02/17 1833 06/03/17 0500 06/03/17 1838 06/04/17 0540  CALCIUM  --  9.2 8.8* 9.1  MG 2.5* 2.5* 2.2  --   PHOS 1.7*  2.2* 1.8*  --     CBC Recent Labs  Lab 06/02/17 0346 06/03/17 1838 06/04/17 0540  WBC 18.6* 17.4* 17.8*  HGB 13.2 11.6* 11.7*  HCT 44.3 39.8 40.4  PLT 198 147* 139*    Coag's Recent Labs  Lab 06/02/17 0159  INR 1.11    Sepsis Markers Recent Labs  Lab 06/02/17 0911 06/02/17 1230 06/02/17 1830 06/02/17 2146 06/03/17 0500 06/04/17 0540  LATICACIDVEN  --  1.9 2.3* 2.4*  --   --   PROCALCITON <0.10  --   --   --  <0.10 <0.10    ABG Recent Labs  Lab 05/30/17 0330 06/02/17 0140 06/02/17 0400  PHART 7.477* 7.291* 7.482*  PCO2ART 54.8* 85.6* 47.1  PO2ART 69.1* 82.9* 266*    Liver Enzymes Recent Labs  Lab 06/01/17 0213 06/02/17 0159 06/02/17 0346  AST  --   --  20  ALT  --   --  24  ALKPHOS  --   --  64  BILITOT  --   --  1.2  ALBUMIN 3.2* 3.3* 3.0*    Cardiac Enzymes Recent Labs  Lab 06/02/17 0346 06/02/17 0911 06/02/17 1513  TROPONINI 0.06* 0.05* 0.05*    Glucose Recent Labs  Lab 06/03/17 0802  06/03/17 1123 06/03/17 1549 06/03/17 2034 06/04/17 0018 06/04/17 0347  GLUCAP 239* 282* 249* 296* 338* 281*    Imaging No results found.  STUDIES:  Echo 10/31>>> EF 35-40%, diffuse hypokinesis CT chest 11/7 read pending > RLL consolidation, no appreciable effusion    CULTURES: BCx2 11/7 > BAL 11/7 > AFB smear negative Viral sputum culture negative at 24 hours.   ANTIBIOTICS: Diflucan (Oral thrush) 11/3>>   Ceftazidine 11/7 > Vancomycin 11/7 > 11/8  SIGNIFICANT EVENTS: Admit 10/31 11/4 extubate 11/5 transfer SDU 11/7 transfer ICU reintubated.  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 Improved initially with diuresis and was extuabted successfully, however, two days later 11/7 became hypoxemic/hypercarbic requiring reintubation.   ASSESSMENT / PLAN:  PULMONARY A: Acute on chronic hypercarbic/hypoxemic respiratory failure Pulmonary edema Hx BOOP - on chronic steroids and 2L [place] at home P:    -Solu-Medrol 40 mg IV  -Continue duoneb, singular -Diuresis directed by advanced CHF service, s/p IV lasix 102m once 11/8 -attempt wean as able, failed SBT this am  CARDIOVASCULAR A: Acute on chronic systolic and diastolic CHF Net Negative>> 16 L on 11/7. Echo with EF 35-40% and diffuse hypokinesis Troponin elevation on initial admit, decreased 11/7 Hx HTN.  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 P:  -daily CBC  INFECTIOUS A: Possible HCAP vs aspiration purulent secretions noted during intubation. Bilateral infiltrates. PCT negative.   Oral thrush Plan:  -Continue ceftaz, vanc stopped 11/8 -Follow BC and BAL, fungal culture, AFB, PJP -Diflucan started 11/3 for oral thrush -Follow fever curve and WBC  ENDOCRINE Hyperglycemia   P: -CBG Q 4   -resistant SSI 2/2 steroids (decreasing today) -Lantus 30U QD 11/8, increase to 40U 11/9 as patient required 62U SSI last 24 hours  NEUROLOGIC A: Acute metabolic encephalopathy:  Hypoxemic, hypercarbia P: -Ventilate  to correct metabolic abnormalities -Fentanyl infusion and PRN for sedation/analgesia  FAMILY  - Updates: no family at bedside on my exam.  - Inter-disciplinary family meet or Palliative Care meeting due by: 11/13   Ralene Ok, MD PGY-2, [facility] 06/04/2017 7:33 AM

## 2017-06-04 NOTE — Progress Notes (Signed)
Pt with frequent runs of SVT following PVCs, heart rate in 200s. Labs sent at 1838, still not resulted and not marked as in process. Lab was called for new CBC and BMP stat orders. EKG obtained.  Elink MD made aware of frequent runs of SVT. No new orders at this time, will wait on labs. Will closely monitor.

## 2017-06-04 NOTE — Care Management Note (Signed)
Case Management Note  Patient Details  Name: [name] MRN: [id number] Date of Birth: 1941-04-05  Subjective/Objective:    Pt admitted with resp failure                Action/Plan:  PTA from home with husband.  Pt is now ventilated - CM will continue to follow for discharge needs   Expected Discharge Date:  06/04/17               Expected Discharge Plan:  [place]  In-House Referral:  NA  Discharge planning Services  CM Consult  Post Acute Care Choice:  Home Health Choice offered to:     DME Arranged:    DME Agency:     HH Arranged:    [place] Agency:     Status of Service:  In process, will continue to follow  If discussed at Long Length of Stay Meetings, dates discussed:    Additional Comments: Pt remains ventilated unable to wean -  precedex initiated 11/9.  CM will continue to follow for discharge needs Maryclare Labrador, RN 06/04/2017, 3:39 PM

## 2017-06-04 NOTE — Progress Notes (Signed)
Patient ID: Kelli Velasquez, female   DOB: 03-24-1941, 76 y.o.   MRN: [id number]      Advanced Heart Failure Rounding Note  Subjective:    Over 2 weeks prior to admission, she had increased dyspnea with exertion, she was short of breath at rest on the day of admission.  Her husband says she has been taking 20 mg of lasix daily and occasionally an extra 20 mg of lasix.   Presented to [facility] ED with respiratory distress. Family reported increased dyspnea over the last week requiring 2 liters oxygen at home. In ED she received solumedrol and duonebs. Placed on Bipap but continue decline requiring intubation. CXR concerning for interstitial edema and bilateral effusions.  Extubated 11/4. Noted to have swelling of upper airways on speech/swallow evaluation 11/6.  Developed respiratory distress 11/6 in the evening with hypoxia and hypercapnia and was re-intubated.    Intubated and sedated.  CXR 06/03/17 with bibasilar opacities suggesting left pleural effusion and atelectasis.   Studies:  Echo 05/26/2017 EF 35-40%  Grade I DD RV normal Echo 2017 EF 45-50% with WMA Myoview 2017 No significant ischemia EF 48%.  [place] 2011- DES LAD and RCA.   Objective:   Weight Range: 240 lb 8.4 oz (109.1 kg) Body mass index is 41.29 kg/m.   Vital Signs:   Temp:  [97.4 F (36.3 C)-100.1 F (37.8 C)] 99.7 F (37.6 C) (11/09 0350) Pulse Rate:  [42-93] 82 (11/09 0700) Resp:  [18-24] 20 (11/09 0700) BP: (96-141)/(41-108) 129/60 (11/09 0700) SpO2:  [92 %-100 %] 99 % (11/09 0718) FiO2 (%):  [40 %] 40 % (11/09 0718) Weight:  [240 lb 8.4 oz (109.1 kg)] 240 lb 8.4 oz (109.1 kg) (11/09 0500) Last BM Date: 06/01/17  Weight change: Filed Weights   06/02/17 0500 06/03/17 0500 06/04/17 0500  Weight: 235 lb 7.2 oz (106.8 kg) 237 lb 7 oz (107.7 kg) 240 lb 8.4 oz (109.1 kg)   Intake/Output:   Intake/Output Summary (Last 24 hours) at 06/04/2017 0750 Last data filed at 06/04/2017 0700 Gross per 24 hour  Intake 3443.71  ml  Output 2675 ml  Net 768.71 ml    Physical Exam   CVP 6-7 this am  General: Intubated/sedated.  HEENT: + ETT Neck: Supple. JVP 6-7 cm. Carotids 2+ bilat; no bruits. No thyromegaly or nodule noted. Cor: PMI nondisplaced. Regular with occasional ectopy.  Lungs: Diminished basilar sounds Abdomen: Soft, non-tender, non-distended, no HSM. No bruits or masses. +BS  Extremities: No cyanosis, clubbing, or rash. Trace to 1+ peripheral edema.  Neuro: Intubated/sedated  Telemetry   NSR with PACs, personally reviewed. NSVT noted overnight.   Labs    CBC Recent Labs    06/03/17 1838 06/04/17 0540  WBC 17.4* 17.8*  NEUTROABS 16.3* 16.5*  HGB 11.6* 11.7*  HCT 39.8 40.4  MCV 100.5* 100.7*  PLT 147* 169*   Basic Metabolic Panel Recent Labs    06/03/17 0500 06/03/17 1838 06/04/17 0540  NA 148* 150* 150*  K 3.3* 3.5 3.4*  CL 105 107 105  CO2 35* 34* 35*  GLUCOSE 233* 316* 296*  BUN 75* 70* 64*  CREATININE 1.39* 1.33* 1.22*  CALCIUM 9.2 8.8* 9.1  MG 2.5* 2.2  --   PHOS 2.2* 1.8*  --    Liver Function Tests Recent Labs    06/02/17 0159 06/02/17 0346  AST  --  20  ALT  --  24  ALKPHOS  --  64  BILITOT  --  1.2  PROT  --  6.2*  ALBUMIN 3.3* 3.0*   No results for input(s): LIPASE, AMYLASE in the last 72 hours. Cardiac Enzymes Recent Labs    06/02/17 0346 06/02/17 0911 06/02/17 1513  TROPONINI 0.06* 0.05* 0.05*   BNP: BNP (last 3 results) Recent Labs    05/26/17 0610  BNP 760.9*   ProBNP (last 3 results) Recent Labs    01/11/17 1053  PROBNP 264.0*   D-Dimer No results for input(s): DDIMER in the last 72 hours. Hemoglobin A1C No results for input(s): HGBA1C in the last 72 hours. Fasting Lipid Panel No results for input(s): CHOL, HDL, LDLCALC, TRIG, CHOLHDL, LDLDIRECT in the last 72 hours. Thyroid Function Tests No results for input(s): TSH, T4TOTAL, T3FREE, THYROIDAB in the last 72 hours.  Invalid input(s): FREET3  Other results:  Imaging     No results found.  Medications:     Scheduled Medications: . aspirin  81 mg Per Tube Daily  . chlorhexidine gluconate (MEDLINE KIT)  15 mL Mouth Rinse BID  . Chlorhexidine Gluconate Cloth  6 each Topical Q0600  . ezetimibe  10 mg Per Tube QHS  . feeding supplement (VITAL HIGH PROTEIN)  1,000 mL Per Tube Q24H  . free water  60 mL Per Tube Q4H  . guaiFENesin  10 mL Per Tube Q8H  . heparin  5,000 Units Subcutaneous Q8H  . insulin aspart  0-20 Units Subcutaneous Q4H  . insulin glargine  30 Units Subcutaneous Daily  . ipratropium-albuterol  3 mL Nebulization Q6H  . mouth rinse  15 mL Mouth Rinse QID  . methylPREDNISolone (SOLU-MEDROL) injection  40 mg Intravenous Q6H  . montelukast  10 mg Per Tube QHS  . pantoprazole sodium  40 mg Per Tube Daily  . sodium chloride flush  10-40 mL Intracatheter Q12H    Infusions: . sodium chloride 250 mL (06/04/17 0700)  . sodium chloride    . cefTAZidime (FORTAZ)  IV Stopped (06/03/17 2330)  . 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  Admitted with acute/chronic respiratory failure  Assessment/Plan   1. Acute/chronic systolic CHF: Probably ischemic cardiomyopathy given prior history.  Echo this admission with EF down to 35-40% range, around 50% in past.  She was admitted with worsening dyspnea, elevated BNP, CXR with pulmonary edema. PCT not elevated.  Has history of BOOP.  Volume status improved and extubated 11/4.  However, on 11/6 developed respiratory distress and was intubated again.  My suspicion here is more HCAP versus aspiration versus BOOP flare than pulmonary edema.  - We had planned right/left heart cath, but with ongoing significant BUN elevation and re-intubation, will  hold off for now.   - CVP 6-7 iva PICC line. Will leave off lasix for now.  - Losartan on hold with BUN/creatinine elevation.  - Continue spiro 12.5 mg daily. - Use hydralazine/nitrates if BP control is needed.  - She has not tolerated beta blockers in the past due to wheezing, would consider starting low dose of beta-1 specific bisoprolol when respiratory status improved. Would not start yet. - With fall in EF, would plan left/right heart cath once she is extubated and stable.  2. Acute hypoxemic respiratory failure: Initially thought multifactorial due to pulmonary edema, effusions, BOOP and  OHS.  She was on IV Solumedrol, now prednisone given BOOP with ?component of exacerbation.  She was diuresed.  At baseline, she is on 2L home oxygen.  Extubated 11/4 but developed respiratory distress 11/6 and re-intubated.  She had had swelling of the upper airways noted by speech/swallow exam.  My suspicion here is aspiration versus HCAP.  She is now on broad spectrum antibiotics.  - Continue abx => ceftazidime. Vanc stopped 06/03/17.  - Steroids per primarily service. Getting solumedrol for flare of BOOP.  - CVP 6-7 cm.  No lasix for this am.  3. CAD: h/o DES to LAD and RCA in 2011.  No chest pain but admitted with severe dyspnea/volume overload and fall in EF on echo.  Troponin mildly elevated but no trend. Suspect most likely demand ischemia from volume overload.  - Given fall in EF, would plan cath when creatinine stabilizes [facility]). Had planned 06/02/17 but re-intubated so will hold off. 4.  AKI:  - Creatinine and BUN stable to improved.  5. HTN:  - Pressures from 130-170s this am.   - Will start on hydralazine 10 mg TID and isordil 10 tid and follow.  6. Hypokalemia - 3.4 this am.  Will supp.  - Check Mg.  7. Code status - Family wishes DNI if she makes it to extubation this time.  8. NSVT - check Mg and supp K as above.   Length of Stay: [place], [place]  06/04/2017, 7:50  AM  Advanced Heart Failure Team Pager [phone] (M-F; 7a - 4p)  Please contact [facility] for night-coverage after hours (4p -7a ) and weekends on [email or web address]  Patient seen with PA, agree with the above note.  She failed SBT and remains intubated.  CVP not elevated (6-7 today).  Suspect HCAP versus aspiration, possibility of BOOP flare as well.  Creatinine improving.  - Steroids and abx per primary service.  - Hold off on diuretics today.   - Gentle hydralazine/nitrates for BP control.   Loralie Champagne 06/04/2017

## 2017-06-04 NOTE — Progress Notes (Signed)
Pt with more frequent runs of SVT, greater than 15 since 19:30. Labs resulted. MD made aware of continued heart rate and labs. Potassium Phosphate to be ordered. Will continue to monitor.

## 2017-06-05 LAB — RENAL FUNCTION PANEL
ALBUMIN: 2.5 g/dL — AB (ref 3.5–5.0)
ANION GAP: 8 (ref 5–15)
BUN: 55 mg/dL — AB (ref 6–20)
CHLORIDE: 111 mmol/L (ref 101–111)
CO2: 32 mmol/L (ref 22–32)
Calcium: 9.2 mg/dL (ref 8.9–10.3)
Creatinine, Ser: 1.06 mg/dL — ABNORMAL HIGH (ref 0.44–1.00)
GFR calc Af Amer: 57 mL/min — ABNORMAL LOW (ref >60)
GFR calc non Af Amer: 49 mL/min — ABNORMAL LOW (ref >60)
GLUCOSE: 241 mg/dL — AB (ref 65–99)
POTASSIUM: 3.6 mmol/L (ref 3.5–5.1)
Phosphorus: 3 mg/dL (ref 2.5–4.6)
Sodium: 151 mmol/L — ABNORMAL HIGH (ref 135–145)

## 2017-06-05 LAB — CBC WITH DIFFERENTIAL/PLATELET
BASOS PCT: 0 %
Basophils Absolute: 0 10*3/uL (ref 0.0–0.1)
Eosinophils Absolute: 0 10*3/uL (ref 0.0–0.7)
Eosinophils Relative: 0 %
HEMATOCRIT: 41.6 % (ref 36.0–46.0)
HEMOGLOBIN: 12 g/dL (ref 12.0–15.0)
LYMPHS ABS: 1 10*3/uL (ref 0.7–4.0)
Lymphocytes Relative: 8 %
MCH: 29 pg (ref 26.0–34.0)
MCHC: 28.8 g/dL — AB (ref 30.0–36.0)
MCV: 100.5 fL — AB (ref 78.0–100.0)
MONO ABS: 0.4 10*3/uL (ref 0.1–1.0)
MONOS PCT: 3 %
NEUTROS ABS: 12.3 10*3/uL — AB (ref 1.7–7.7)
Neutrophils Relative %: 89 %
Platelets: 130 10*3/uL — ABNORMAL LOW (ref 150–400)
RBC: 4.14 MIL/uL (ref 3.87–5.11)
RDW: 15.1 % (ref 11.5–15.5)
WBC: 13.8 10*3/uL — ABNORMAL HIGH (ref 4.0–10.5)

## 2017-06-05 LAB — CULTURE, RESPIRATORY

## 2017-06-05 LAB — GLUCOSE, CAPILLARY
GLUCOSE-CAPILLARY: 130 mg/dL — AB (ref 65–99)
GLUCOSE-CAPILLARY: 174 mg/dL — AB (ref 65–99)
GLUCOSE-CAPILLARY: 234 mg/dL — AB (ref 65–99)
GLUCOSE-CAPILLARY: 244 mg/dL — AB (ref 65–99)
Glucose-Capillary: 159 mg/dL — ABNORMAL HIGH (ref 65–99)
Glucose-Capillary: 243 mg/dL — ABNORMAL HIGH (ref 65–99)
Glucose-Capillary: 256 mg/dL — ABNORMAL HIGH (ref 65–99)

## 2017-06-05 LAB — MAGNESIUM: Magnesium: 2.3 mg/dL (ref 1.7–2.4)

## 2017-06-05 LAB — CULTURE, BAL-QUANTITATIVE

## 2017-06-05 LAB — CULTURE, BAL-QUANTITATIVE W GRAM STAIN: Culture: 30000 — AB

## 2017-06-05 LAB — CULTURE, RESPIRATORY W GRAM STAIN

## 2017-06-05 MED ORDER — FREE WATER
200.0000 mL | Freq: Three times a day (TID) | Status: DC
  Administered 2017-06-05 – 2017-06-07 (×6): 200 mL

## 2017-06-05 MED ORDER — INSULIN GLARGINE 100 UNIT/ML ~~LOC~~ SOLN
50.0000 [IU] | Freq: Every day | SUBCUTANEOUS | Status: DC
  Filled 2017-06-05: qty 0.5

## 2017-06-05 MED ORDER — HALOPERIDOL LACTATE 5 MG/ML IJ SOLN
5.0000 mg | Freq: Four times a day (QID) | INTRAMUSCULAR | Status: DC
  Administered 2017-06-05 – 2017-06-07 (×9): 5 mg via INTRAVENOUS
  Filled 2017-06-05 (×12): qty 1

## 2017-06-05 MED ORDER — INSULIN GLARGINE 100 UNIT/ML ~~LOC~~ SOLN
30.0000 [IU] | Freq: Two times a day (BID) | SUBCUTANEOUS | Status: DC
  Administered 2017-06-05 (×2): 30 [IU] via SUBCUTANEOUS
  Filled 2017-06-05 (×3): qty 0.3

## 2017-06-05 MED ORDER — DEXTROSE 5 % IV SOLN
1.0000 g | INTRAVENOUS | Status: AC
  Administered 2017-06-05 – 2017-06-11 (×7): 1 g via INTRAVENOUS
  Filled 2017-06-05 (×8): qty 10

## 2017-06-05 MED ORDER — INSULIN NPH (HUMAN) (ISOPHANE) 100 UNIT/ML ~~LOC~~ SUSP
30.0000 [IU] | Freq: Every day | SUBCUTANEOUS | Status: AC
  Administered 2017-06-05: 30 [IU] via SUBCUTANEOUS
  Filled 2017-06-05: qty 10

## 2017-06-05 NOTE — Progress Notes (Signed)
eLink Physician-Brief Progress Note Patient Name: [name] DOB: [birth year] MRN: [id number]   Date of Service  06/05/2017  HPI/Events of Note  Agitation - Request for bilateral soft wrist restraints.   eICU Interventions  Will order bilateral soft wrist restraints.      Intervention Category Minor Interventions: Agitation / anxiety - evaluation and management  Sommer,Steven Eugene 06/05/2017, 7:52 PM

## 2017-06-05 NOTE — Progress Notes (Signed)
Pt continues to be very combative and agitated during stimulation of routing care and mouth care, versed along with Precedex effective short periods of time. MD aware new orders placed for Haldol during rounding. Family at bedside and very supportive of staff and pt. Will continue to monitor and reassess. Safety maintained.

## 2017-06-05 NOTE — Progress Notes (Signed)
PULMONARY / CRITICAL CARE MEDICINE   Name: Kelli Velasquez MRN: [id number] DOB: 1941-01-19    ADMISSION DATE:  05/26/2017 CONSULTATION DATE:  Admitted 10/31 > extubated 11/4 and to SDU 11/5. 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  She had significant respiratory distress in ER and failed trial bipap requiring intubation.  Initial w/u revealed BNP >700, WBC 21.4 (chronic steroids), hypercarbic respiratory failure with pH 7.211, PCO2 99.4. She required intubation and responded well to diuresis. She was extubated 11/4 and transferred out to SDU the following day. Further diuresis limited by arrhythmia and electrolyte abnormality. Failed swallow eval.    PAST MEDICAL HISTORY :  She  has a past medical history of Allergy, Asthma, Chronic bronchitis ([place]), Coronary artery disease, Dyspnea, Full dentures, Gallstones, GERD (gastroesophageal reflux disease), History of echocardiogram, History of nuclear stress test, Hyperlipidemia, Hypertension, Myocardial infarction ([place]), Myopia, Neoplasm of skin, Nocturnal oxygen desaturation, Obesity, Osteoarthritis, Overactive bladder, Oxygen deficiency, Rash, Retaining fluid, Urinary incontinence, Vertigo, and Wears glasses.  PAST SURGICAL HISTORY: She  has a past surgical history that includes Total knee arthroplasty (2010); vocal cord polypectomy; Cholecystectomy (92); Joint replacement (2007); Dilation and curettage of uterus (1984); Colonoscopy; Coronary angioplasty with stent (07/2009); CATARACT EXTRACTION PHACO AND INTRAOCULAR LENS PLACEMENT (IOC)  Left  Complicated (Left, 9/37/1696); CATARACT EXTRACTION PHACO AND INTRAOCULAR LENS PLACEMENT (IOC)  right (Right, 12/16/2016); VIDEO BRONCHOSCOPY WITH FLUORO (Bilateral, 07/05/2013);  HERNIA REPAIR INFRAUMILICAL INCISIONAL (N/A, 05/16/2013); and INSERTION OF MESH (N/A, 05/16/2013).  Allergies  Allergen Reactions  . Fish Allergy Anaphylaxis  . Shellfish Allergy Anaphylaxis  . Aspirin     REACTION: nausea and vomiting High doses  . Atorvastatin     REACTION: leg pain  . Crestor [Rosuvastatin Calcium] Other (See Comments)    Muscle pain - allergy/intolerance  . Penicillins     Due to mold allergy per pt  . Simvastatin     REACTION: muscle pain  . Trandolapril     REACTION: leg pain    No current facility-administered medications on file prior to encounter.    Current Outpatient Medications on File Prior to Encounter  Medication Sig  . acetaminophen (TYLENOL) 325 MG tablet Take 650 mg by mouth every 6 (six) hours as needed for mild pain. Take as needed per bottle   . acetaminophen-codeine (TYLENOL #3) 300-30 MG tablet One every 4 hours as needed for cough  . amLODipine (NORVASC) 10 MG tablet Take 1 tablet (10 mg total) by mouth daily.  Marland Kitchen aspirin 81 MG tablet Take 81 mg by mouth daily.  . chlorpheniramine (CHLOR-TRIMETON) 4 MG tablet Take 4 mg by mouth every morning.   . cromolyn (NASALCROM) 5.2 MG/ACT nasal spray Place 2 sprays into both nostrils daily as needed for allergies.  . cyanocobalamin (,VITAMIN B-12,) 1000 MCG/ML injection Inject 1,000 mcg into the muscle every 3 (three) months.  . Cyanocobalamin (VITAMIN B12) 3000 MCG SUBL Place 1 capsule under the tongue daily.  Marland Kitchen dextromethorphan-guaiFENesin (MUCINEX DM) 30-600 MG 12hr tablet Take 1 tablet by mouth 2 (two) times daily as needed for cough.  . EPIPEN 2-PAK 0.3 MG/0.3ML SOAJ injection INJECT 0.3 MLS (0.3 MG TOTAL) INTO THE MUSCLE ONCE AS NEEDED FOR ALLERGIC REACTION  . esomeprazole (NEXIUM) 40 MG capsule Take 1 capsule (  40 mg total) by mouth daily.  Marland Kitchen ezetimibe (ZETIA) 10 MG tablet TAKE 1 TABLET (10 MG TOTAL) BY MOUTH DAILY.  . furosemide (LASIX) 20 MG tablet TAKE 1 TABLET BY MOUTH EVERY DAY  .  furosemide (LASIX) 40 MG tablet Take 1 tablet (40 mg total) by mouth daily. Take 1 daily as needed for extra swelling (Patient taking differently: Take 40 mg by mouth daily as needed for fluid. Take 1 daily as needed for extra swelling )  . montelukast (SINGULAIR) 10 MG tablet Take 1 tablet (10 mg total) by mouth at bedtime.  . Multiple Vitamin (MULTIVITAMIN) capsule Take 1 capsule by mouth daily.  . OXYGEN Inhale 3 L into the lungs continuous. 24/7 2 lpm  [employer]  . potassium chloride SA (KLOR-CON M20) 20 MEQ tablet Take 1 tablet (20 mEq total) by mouth daily as needed. (Patient taking differently: Take 20 mEq by mouth daily. )  . PREDNISONE PO Take 15-20 mg by mouth daily. Take 20 mg one day and 15 mg the next days  . PROAIR HFA 108 (90 BASE) MCG/ACT inhaler INHALE 2 PUFFS BY MOUTH EVERY 4 HOURS AS NEEDED FOR WHEEZING OR FOR SHORTNESS OF BREATH  . ranitidine (ZANTAC) 75 MG tablet Take 300 mg by mouth at bedtime.     FAMILY HISTORY:  Her indicated that her mother is deceased. She indicated that her father is deceased. She indicated that the status of her maternal grandmother is unknown. She indicated that the status of her neg hx is unknown.  SOCIAL HISTORY: She  reports that she quit smoking about 34 years ago. Her smoking use included cigarettes. She has a 25.00 pack-year smoking history. she has never used smokeless tobacco. She reports that she drinks alcohol. She reports that she does not use drugs.  REVIEW OF SYSTEMS:   Patient sleeping peacefully on sedation.   SUBJECTIVE:  Ventilated, unable to provide.   VITAL SIGNS: BP (!) 151/72   Pulse (!) 49   Temp 99.9 F (37.7 C) (Oral)   Resp (!) 24   Ht _0  (1.626 m)   Wt 239 lb 3.2 oz (108.5 kg)   LMP 07/27/1980   SpO2 98%   BMI 41.06 kg/m   HEMODYNAMICS: CVP:  [5 mmHg-8 mmHg] 8 mmHg  VENTILATOR SETTINGS: Vent Mode: PRVC FiO2 (%):  [40 %] 40 % Set Rate:  [20 bmp] 20 bmp Vt Set:  [460 mL] 460 mL PEEP:  [5 cmH20] 5  cmH20 Plateau Pressure:  [18 cmH20-19 cmH20] 18 cmH20  INTAKE / OUTPUT: I/O last 3 completed shifts: In: 4947.9 [I.V.:1737.9; NG/GT:2160; IV Piggyback:1050] Out: 3670 [Urine:3670]  PHYSICAL EXAMINATION: General:  Obese female lying in bed, on vent.  Neuro: Sleeping peacefully, RN reports agitation on awakening.  HEENT:  Normocephalic atraumatic, unable to appreciate JVD 2/2 body habitus Cardiovascular:  Irregular rhythm, normal rate, no MRG Lungs:  Good air movement, coarse breath sounds throughout.  Abdomen:  SNTND, +BS Musculoskeletal:  WWP, minimal BLE.  Skin:  Intact, warm, well perfused.   LABS:  BMET Recent Labs  Lab 06/03/17 1838 06/04/17 0540 06/05/17 0404  NA 150* 150* 151*  K 3.5 3.4* 3.6  CL 107 105 111  CO2 34* 35* 32  BUN 70* 64* 55*  CREATININE 1.33* 1.22* 1.06*  GLUCOSE 316* 296* 241*    Electrolytes Recent Labs  Lab 06/03/17 0500 06/03/17 1838 06/04/17 0540 06/04/17 0814 06/05/17 0404  CALCIUM 9.2 8.8* 9.1  --  9.2  MG 2.5* 2.2  --  2.3 2.3  PHOS 2.2* 1.8*  --   --  3.0    CBC Recent Labs  Lab 06/03/17 1838 06/04/17 0540 06/05/17 0404  WBC 17.4* 17.8* 13.8*  HGB 11.6* 11.7* 12.0  HCT 39.8 40.4 41.6  PLT 147* 139* 130*    Coag's Recent Labs  Lab 06/02/17 0159  INR 1.11    Sepsis Markers Recent Labs  Lab 06/02/17 0911 06/02/17 1230 06/02/17 1830 06/02/17 2146 06/03/17 0500 06/04/17 0540  LATICACIDVEN  --  1.9 2.3* 2.4*  --   --   PROCALCITON <0.10  --   --   --  <0.10 <0.10    ABG Recent Labs  Lab 05/30/17 0330 06/02/17 0140 06/02/17 0400  PHART 7.477* 7.291* 7.482*  PCO2ART 54.8* 85.6* 47.1  PO2ART 69.1* 82.9* 266*    Liver Enzymes Recent Labs  Lab 06/02/17 0159 06/02/17 0346 06/05/17 0404  AST  --  20  --   ALT  --  24  --   ALKPHOS  --  64  --   BILITOT  --  1.2  --   ALBUMIN 3.3* 3.0* 2.5*    Cardiac Enzymes Recent Labs  Lab 06/02/17 0346 06/02/17 0911 06/02/17 1513  TROPONINI 0.06* 0.05*  0.05*    Glucose Recent Labs  Lab 06/04/17 0815 06/04/17 1143 06/04/17 1559 06/04/17 2047 06/05/17 0015 06/05/17 0346  GLUCAP 236* 202* 264* 352* 243* 234*    Imaging No results found.  STUDIES:  Echo 10/31>>> EF 35-40%, diffuse hypokinesis CT chest 11/7 read pending > RLL consolidation, no appreciable effusion    CULTURES: BCx2 11/7 > BAL 11/7 > AFB smear negative Viral sputum culture negative at 24 hours.   ANTIBIOTICS: Diflucan (Oral thrush) 11/3>>   Ceftazidine 11/7 > Vancomycin 11/7 > 11/8  SIGNIFICANT EVENTS: Admit 10/31 11/4 extubate 11/5 transfer SDU 11/7 transfer ICU reintubated.  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 Improved initially with diuresis and was extuabted successfully, however, two days later 11/7 became hypoxemic/hypercarbic requiring reintubation.   ASSESSMENT / PLAN:  PULMONARY A: Acute on chronic hypercarbic/hypoxemic respiratory failure Pulmonary edema Hx BOOP - on chronic steroids and 2L [place] at home P:    -Solu-Medrol 40 mg IV q12 hrs -Continue duoneb, singular -Diuresis directed by advanced CHF service, s/p IV lasix 37m once 11/8 -attempt wean as able  CARDIOVASCULAR A: Acute on chronic systolic and diastolic CHF Net Negative>> 16 L on 11/7. Echo with EF 35-40% and diffuse hypokinesis Troponin elevation on initial admit, decreased 11/7 Hx HTN.  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 P:  -daily CBC  INFECTIOUS A: Possible HCAP vs aspiration purulent secretions noted during  intubation. Bilateral infiltrates. PCT negative.  Oral thrush Plan:  -Continue ceftaz, vanc stopped 11/8 -Follow BC and BAL, fungal culture, AFB, PJP -Diflucan started 11/3 for oral thrush -Follow fever curve and WBC  ENDOCRINE Hyperglycemia   P: -CBG Q 4   -resistant SSI 2/2 steroids (decreasing today) -Lantus 30U QD 11/8, increase to 50U 11/9 as patient required 59U SSI last 24 hours  NEUROLOGIC A: Acute metabolic encephalopathy:  Hypoxemic, hypercarbia P: -Ventilate to correct metabolic abnormalities -Fentanyl infusion and PRN for sedation/analgesia  FAMILY  - Updates: no family at bedside on my exam.  - Inter-disciplinary family meet or Palliative Care meeting due by: 11/13   Ralene Ok, MD PGY-2, [facility] 06/05/2017 6:04 AM

## 2017-06-06 LAB — GLUCOSE, CAPILLARY
GLUCOSE-CAPILLARY: 241 mg/dL — AB (ref 65–99)
GLUCOSE-CAPILLARY: 258 mg/dL — AB (ref 65–99)
GLUCOSE-CAPILLARY: 233 mg/dL — AB (ref 65–99)
Glucose-Capillary: 239 mg/dL — ABNORMAL HIGH (ref 65–99)
Glucose-Capillary: 152 mg/dL — ABNORMAL HIGH (ref 65–99)

## 2017-06-06 LAB — RENAL FUNCTION PANEL
ANION GAP: 6 (ref 5–15)
Albumin: 2.3 g/dL — ABNORMAL LOW (ref 3.5–5.0)
BUN: 45 mg/dL — ABNORMAL HIGH (ref 6–20)
CO2: 32 mmol/L (ref 22–32)
Calcium: 8.5 mg/dL — ABNORMAL LOW (ref 8.9–10.3)
Chloride: 113 mmol/L — ABNORMAL HIGH (ref 101–111)
Creatinine, Ser: 0.88 mg/dL (ref 0.44–1.00)
GFR calc Af Amer: >60 mL/min (ref >60)
GFR calc non Af Amer: >60 mL/min (ref >60)
GLUCOSE: 253 mg/dL — AB (ref 65–99)
PHOSPHORUS: 3.3 mg/dL (ref 2.5–4.6)
POTASSIUM: 3.5 mmol/L (ref 3.5–5.1)
Sodium: 151 mmol/L — ABNORMAL HIGH (ref 135–145)

## 2017-06-06 LAB — MAGNESIUM: Magnesium: 2 mg/dL (ref 1.7–2.4)

## 2017-06-06 LAB — CBC WITH DIFFERENTIAL/PLATELET
BASOS ABS: 0 10*3/uL (ref 0.0–0.1)
BASOS PCT: 0 %
EOS ABS: 0 10*3/uL (ref 0.0–0.7)
EOS PCT: 0 %
HCT: 38.6 % (ref 36.0–46.0)
Hemoglobin: 11.2 g/dL — ABNORMAL LOW (ref 12.0–15.0)
LYMPHS PCT: 6 %
Lymphs Abs: 1 10*3/uL (ref 0.7–4.0)
MCH: 29.7 pg (ref 26.0–34.0)
MCHC: 29 g/dL — AB (ref 30.0–36.0)
MCV: 102.4 fL — ABNORMAL HIGH (ref 78.0–100.0)
MONO ABS: 0.5 10*3/uL (ref 0.1–1.0)
Monocytes Relative: 3 %
Neutro Abs: 15 10*3/uL — ABNORMAL HIGH (ref 1.7–7.7)
Neutrophils Relative %: 90 %
Platelets: 102 10*3/uL — ABNORMAL LOW (ref 150–400)
RBC: 3.77 MIL/uL — AB (ref 3.87–5.11)
RDW: 15.3 % (ref 11.5–15.5)
WBC: 16.6 10*3/uL — AB (ref 4.0–10.5)

## 2017-06-06 MED ORDER — HALOPERIDOL LACTATE 5 MG/ML IJ SOLN: 5.0000 mg | Freq: Four times a day (QID) | INTRAMUSCULAR | Status: DC | PRN

## 2017-06-06 MED ORDER — MIDAZOLAM HCL 2 MG/2ML IJ SOLN
1.0000 mg | INTRAMUSCULAR | Status: DC | PRN
  Administered 2017-06-06 – 2017-06-07 (×3): 2 mg via INTRAVENOUS
  Filled 2017-06-06 (×3): qty 2

## 2017-06-06 MED ORDER — WHITE PETROLATUM EX OINT
TOPICAL_OINTMENT | CUTANEOUS | Status: AC
  Administered 2017-06-06: 09:00:00
  Filled 2017-06-06: qty 28.35

## 2017-06-06 MED ORDER — BISACODYL 10 MG RE SUPP
10.0000 mg | Freq: Once | RECTAL | Status: AC
  Administered 2017-06-06: 10 mg via RECTAL
  Filled 2017-06-06: qty 1

## 2017-06-06 MED ORDER — INSULIN GLARGINE 100 UNIT/ML ~~LOC~~ SOLN
35.0000 [IU] | Freq: Two times a day (BID) | SUBCUTANEOUS | Status: DC
  Administered 2017-06-06 – 2017-06-07 (×3): 35 [IU] via SUBCUTANEOUS
  Filled 2017-06-06 (×3): qty 0.35

## 2017-06-06 MED ORDER — SODIUM CHLORIDE 0.9% FLUSH: 3.0000 mL | INTRAVENOUS | Status: DC | PRN

## 2017-06-06 MED ORDER — SODIUM CHLORIDE 0.9% FLUSH
3.0000 mL | Freq: Two times a day (BID) | INTRAVENOUS | Status: DC
  Administered 2017-06-06 – 2017-06-07 (×2): 3 mL via INTRAVENOUS

## 2017-06-06 MED ORDER — SODIUM CHLORIDE 0.9 % IV SOLN: 250.0000 mL | INTRAVENOUS | Status: DC | PRN

## 2017-06-06 NOTE — Progress Notes (Signed)
[facility] Attending Note  ADMISSION DATE:05/26/2017  REFERRING QQ:PYPPJ Campos, M.D. / EDP  CHIEF COMPLAINT:Respiratory failure   Presenting HPI:  76 y.o. female with prior history of tobacco use having quit in 1984. 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 Melvyn Novas on chronic prednisone as well as congestive heart failure, essential hypertension, and history of coronary artery disease. Presented on 10/31 with progressively increasing cough, wheezing, shortness of breath, and bilateral lower extremity edema. Patient  failed a trial of noninvasive positive pressure ventilation and ultimately required endotracheal intubation.  Subjective:  No acute events overnight. Patient slightly more calm today. Still not following commands.  Review of Systems:  Unable to obtain given intubation & altered mentation.   Vent Mode: PRVC FiO2 (%):  [40 %] 40 % Set Rate:  [20 bmp] 20 bmp Vt Set:  [460 mL] 460 mL PEEP:  [5 cmH20] 5 cmH20 Plateau Pressure:  [13 cmH20-18 cmH20] 13 cmH20  Temp:  [98.3 F (36.8 C)-99.8 F (37.7 C)] 98.3 F (36.8 C) (11/11 0744) Pulse Rate:  [30-114] 114 (11/11 0728) Resp:  [18-27] 24 (11/11 0728) BP: (93-152)/(43-134) 149/60 (11/11 0728) SpO2:  [96 %-100 %] 99 % (11/11 0728) FiO2 (%):  [40 %] 40 % (11/11 0728) Weight:  [241 lb 10 oz (109.6 kg)] 241 lb 10 oz (109.6 kg) (11/11 0500)  General: Family at bedside. Intubated. No acute distress. Integument: Warm. Dry. Bruising of various ages on upper extremity. HEENT: Moist membranes. Endotracheal tube in place. Neurological: Spontaneously moving all 4 extremities. Intermittently opening eyes to voice and tactile stimulus. Not following commands. Cardiovascular: Regular rate. Occasional PACs and PVCs on telemetry. No edema appreciated. Pulmonary: Distant breath sounds. Otherwise clear with auscultation. Symmetric chest wall expansion on ventilator. Abdomen:  Soft. Protuberant. Normal bowel sounds.  LINES/TUBES: OETT 11/7 >>> RUE DL PICC 11/7 >>> Foley >>> PIV  CBC Latest Ref Rng & Units 06/06/2017 06/05/2017 06/04/2017  WBC 4.0 - 10.5 K/uL 16.6(H) 13.8(H) 17.8(H)  Hemoglobin 12.0 - 15.0 g/dL 11.2(L) 12.0 11.7(L)  Hematocrit 36.0 - 46.0 % 38.6 41.6 40.4  Platelets 150 - 400 K/uL 102(L) 130(L) 139(L)   BMP Latest Ref Rng & Units 06/06/2017 06/05/2017 06/04/2017  Glucose 65 - 99 mg/dL 253(H) 241(H) 296(H)  BUN 6 - 20 mg/dL 45(H) 55(H) 64(H)  Creatinine 0.44 - 1.00 mg/dL 0.88 1.06(H) 1.22(H)  Sodium 135 - 145 mmol/L 151(H) 151(H) 150(H)  Potassium 3.5 - 5.1 mmol/L 3.5 3.6 3.4(L)  Chloride 101 - 111 mmol/L 113(H) 111 105  CO2 22 - 32 mmol/L 32 32 35(H)  Calcium 8.9 - 10.3 mg/dL 8.5(L) 9.2 9.1   Hepatic Function Latest Ref Rng & Units 06/06/2017 06/05/2017 06/02/2017  Total Protein 6.5 - 8.1 g/dL - - 6.2(L)  Albumin 3.5 - 5.0 g/dL 2.3(L) 2.5(L) 3.0(L)  AST 15 - 41 U/L - - 20  ALT 14 - 54 U/L - - 24  Alk Phosphatase 38 - 126 U/L - - 64  Total Bilirubin 0.3 - 1.2 mg/dL - - 1.2  Bilirubin, Direct 0.0 - 0.3 mg/dL - - -    IMAGING/STUDIES: PFT 01/31/13:  FVC 2.23 L (79%) FEV1 1.66 L (75%) FEV1/FVC 0.77 FEF 25-75 1.27 L (56%) negative bronchodilator response TLC 3.74 L (73%) RV 82% DLCO uncorrected 74% TTE 10/31:  Mild LVH with EF 35-40% & diffuse hypokinesis. Grade 1 diastolic dysfunction. LA moderately dilated & RA normal in size. RV normal in size and function. No aortic stenosis or regurgitation.  Mild mitral regurgitation. Pulmonic valve poorly visualized without significant regurgitation. No significant tricuspid regurgitation. No pericardial effusion. PORT CXR 11/5:  Previously reviewed by me. Bilateral lower lung opacities slightly worsened. No new focal opacity appreciated. Question left pleural effusion. CT CHEST W/O 11/7:  Bilateral dependent consolidation with air bronchograms more so in the posterior segment of the right lower lobe than  the left lower lobe. Trace left pleural effusion. No pathologic mediastinal adenopathy. No pericardial effusion. Additional nodules noted within left lung. Cardiomegaly noted BAL RML 11/7: Cyto neg for fungus or malignancy. WBC 94 (20% lymph, 27% neutro, 53% macro). 120cc in / 18cc out.  PORT CXR 11/8:  Previously reviewed by me. Left basilar opacity is unchanged. Enteric feeding tube coursing below diaphragm. Endotracheal tube and right upper extremity PICC line in good position.  MICROBIOLOGY: MRSA PCR 10/31:  Negative  Blood Cultures x2 11/7 >>> Tracheal Aspirate Culture 11/7:  MSSA Quant BAL RML 11/7:  Normal Respiratory Flora  BAL RML Legionella Culture 11/7 >>> BAL RML Viral Culture 11/7 >>> BAL RML AFB Culture 11/7 >>> BAL RML Fungal Culture 11/7 >>>  ANTIBIOTICS: Diflucan 11/2 (x1 dose) Vancomycin 11/7 - 11/8 Fortaz 11/7 - 11/10 Rocephin 11/10 >>>  SIGNIFICANT EVENTS: 10/31 - Admit 11/06 - Husband reports patient was intermittently altered all day. Didn't seem to respond to NIPPV. 11/07 - Transferred to the ICU & intubated for worsening respiratory failure & mentation >> bronchoscopy performed 11/09 - Apnea during SBT/PS weaning on Fentanyl gtt >> trying Precedex. Weaned to Solu-Medrol IV q12hr.  11/10 - Still with intermittent agitation on Precedex drip >> started scheduled Haldol q6hr.  ASSESSMENT/PLAN:  76 y.o. female with known history of heart failure and cryptogenic organizing pneumonia. Chronically on prednisone therapy. Delirium with marginal improvement today as patient is slightly more calm. Improving laryngeal edema.  1. Acute hypoxic respiratory failure: Continuing daily spontaneous breathing trial & pressure support wean. Mental status is main barrier to extubate patient. Continuing Duonebs every 6 hours. 2. Delirium/acute insult probably: Continuing Haldol IV every 6 hours. Monitoring QTC with daily EKG. Minimizing benzodiazepines. Haldol IV when necessary  ordered. 3. Probable aspiration/Staphylococcus aureus pneumonia: Continuing Rocephin total day #5. Awaiting finalization of further bronchoscopy and lavage results. 4. Laryngeal edema: Continuing Solu-Medrol IV every 12 hours total day #5. 5. Hyperglycemia: Continuing Lantus 35 units subcutaneous every 12 hours. Continuing Accu-Cheks every 4 hours with sliding scale insulin algorithm. 6. Acute on chronic renal failure: Certainly improving. Holding further diuresis. Monitoring urine output with Foley catheter. Trending like lites and renal function daily. 7. Acute hypercarbic respiratory failure: Resolved. Continuing Duonebs and Solu-Medrol as above. 8. The genetic organizing pneumonia: Continuing IV Solu-Medrol. 9. Hypokalemia: Resolved. 10. Hypernatremia: Mild. Stable. Continuing free water via tube. Holding on diuresis. 11. Thrombocytopenia: Discontinuing subcutaneous heparin. Checking hit antibody. Holding on systemic anticoagulation. 12. Anemia: Mild. No signs of active bleeding. Trending cell counts daily with CBC. 13. Constipation:  Dulcolax suppository x1 today.   Prophylaxis:  Heparin [place] q8hr, SCDs, & Protonix via tube daily.  Diet:  NPO. Tube feedings per dietary recommendations. Code Status:  Full Code for now. DNR/DNI once extubated. Disposition:  Patient to remain in ICU while critically ill. Plan for one-way extubation eventually. Family Update:  Husband and family updated during rounds today.   I have personally spent a total of 31 minutes of critical care time today caring for the patient, updating family at bedside, & reviewing the patient's electronic medical record.  Sonia Baller Ashok Cordia, M.D.  [facility] Pager:  [phone] After 7pm or if no response, call [phone] 8:43 AM 06/06/17

## 2017-06-06 NOTE — Progress Notes (Signed)
eLink Physician-Brief Progress Note Patient Name: Kelli Velasquez DOB: 1941/04/15 MRN: [id number]   Date of Service  06/06/2017  HPI/Events of Note  Hyperglycemia - Blood glucose = 239. Currently on Lantus 30 units BID and resistant Q 4 hour Novolog SSI.   eICU Interventions  Will order: 1. Increase Lantus dose to 35 units BID.      Intervention Category Major Interventions: Hyperglycemia - active titration of insulin therapy  Lysle Dingwall 06/06/2017, 4:43 AM

## 2017-06-06 NOTE — Progress Notes (Signed)
Pt. On minimal sedation and able to wean without any adverse affects, however pt still not following commands. Suppository given. Pt tolerated well.

## 2017-06-06 NOTE — Progress Notes (Signed)
[facility] Progress Note Patient Name: BEKAH IGOE DOB: 1941-03-26 MRN: [id number]   Date of Service  06/06/2017  HPI/Events of Note  Agitation - Request to renew bilateral soft wrist restraints.  eICU Interventions  Will reorder bilateral soft wrist restraints.      Intervention Category Minor Interventions: Agitation / anxiety - evaluation and management  Morris Markham Eugene 06/06/2017, 10:05 PM

## 2017-06-07 ENCOUNTER — Inpatient Hospital Stay (HOSPITAL_COMMUNITY): Payer: Medicare Other

## 2017-06-07 LAB — CBC WITH DIFFERENTIAL/PLATELET
BASOS PCT: 0 %
Basophils Absolute: 0 10*3/uL (ref 0.0–0.1)
EOS ABS: 0 10*3/uL (ref 0.0–0.7)
EOS PCT: 0 %
HCT: 37.1 % (ref 36.0–46.0)
HEMOGLOBIN: 10.8 g/dL — AB (ref 12.0–15.0)
LYMPHS ABS: 0.6 10*3/uL — AB (ref 0.7–4.0)
Lymphocytes Relative: 3 %
MCH: 29.9 pg (ref 26.0–34.0)
MCHC: 29.1 g/dL — AB (ref 30.0–36.0)
MCV: 102.8 fL — ABNORMAL HIGH (ref 78.0–100.0)
Monocytes Absolute: 0.7 10*3/uL (ref 0.1–1.0)
Monocytes Relative: 4 %
NEUTROS PCT: 93 %
Neutro Abs: 17.4 10*3/uL — ABNORMAL HIGH (ref 1.7–7.7)
Platelets: 95 10*3/uL — ABNORMAL LOW (ref 150–400)
RBC: 3.61 MIL/uL — AB (ref 3.87–5.11)
RDW: 15.2 % (ref 11.5–15.5)
WBC: 18.7 10*3/uL — AB (ref 4.0–10.5)

## 2017-06-07 LAB — RENAL FUNCTION PANEL
Albumin: 2.2 g/dL — ABNORMAL LOW (ref 3.5–5.0)
Anion gap: 7 (ref 5–15)
BUN: 44 mg/dL — ABNORMAL HIGH (ref 6–20)
CALCIUM: 8.6 mg/dL — AB (ref 8.9–10.3)
CHLORIDE: 112 mmol/L — AB (ref 101–111)
CO2: 31 mmol/L (ref 22–32)
CREATININE: 0.78 mg/dL (ref 0.44–1.00)
GFR calc Af Amer: >60 mL/min (ref >60)
Glucose, Bld: 271 mg/dL — ABNORMAL HIGH (ref 65–99)
Phosphorus: 2.6 mg/dL (ref 2.5–4.6)
Potassium: 3.2 mmol/L — ABNORMAL LOW (ref 3.5–5.1)
SODIUM: 150 mmol/L — AB (ref 135–145)

## 2017-06-07 LAB — HEPARIN INDUCED PLATELET AB (HIT ANTIBODY): HEPARIN INDUCED PLT AB: 0.837 {OD_unit} — AB (ref 0.000–0.400)

## 2017-06-07 LAB — GLUCOSE, CAPILLARY
GLUCOSE-CAPILLARY: 203 mg/dL — AB (ref 65–99)
GLUCOSE-CAPILLARY: 77 mg/dL (ref 65–99)
GLUCOSE-CAPILLARY: 109 mg/dL — AB (ref 65–99)
Glucose-Capillary: 106 mg/dL — ABNORMAL HIGH (ref 65–99)
Glucose-Capillary: 226 mg/dL — ABNORMAL HIGH (ref 65–99)
Glucose-Capillary: 235 mg/dL — ABNORMAL HIGH (ref 65–99)
Glucose-Capillary: 251 mg/dL — ABNORMAL HIGH (ref 65–99)
Glucose-Capillary: 95 mg/dL (ref 65–99)

## 2017-06-07 LAB — CULTURE, BLOOD (ROUTINE X 2)
CULTURE: NO GROWTH
CULTURE: NO GROWTH
SPECIAL REQUESTS: ADEQUATE
Special Requests: ADEQUATE

## 2017-06-07 LAB — MAGNESIUM: MAGNESIUM: 2.1 mg/dL (ref 1.7–2.4)

## 2017-06-07 MED ORDER — POTASSIUM CHLORIDE 20 MEQ/15ML (10%) PO SOLN
40.0000 meq | Freq: Once | ORAL | Status: DC
  Filled 2017-06-07: qty 30

## 2017-06-07 MED ORDER — ISOSORBIDE DINITRATE 10 MG PO TABS
10.0000 mg | ORAL_TABLET | Freq: Three times a day (TID) | ORAL | Status: DC
  Administered 2017-06-08: 10 mg via ORAL
  Filled 2017-06-07 (×5): qty 1

## 2017-06-07 MED ORDER — HYDRALAZINE HCL 20 MG/ML IJ SOLN
10.0000 mg | INTRAMUSCULAR | Status: DC | PRN
  Administered 2017-06-07: 20 mg via INTRAVENOUS
  Filled 2017-06-07 (×2): qty 1

## 2017-06-07 MED ORDER — HYDRALAZINE HCL 25 MG PO TABS
12.5000 mg | ORAL_TABLET | Freq: Three times a day (TID) | ORAL | Status: DC
  Filled 2017-06-07 (×3): qty 0.5

## 2017-06-07 MED ORDER — EZETIMIBE 10 MG PO TABS
10.0000 mg | ORAL_TABLET | Freq: Every day | ORAL | Status: DC
  Filled 2017-06-07 (×2): qty 1

## 2017-06-07 MED ORDER — INSULIN GLARGINE 100 UNIT/ML ~~LOC~~ SOLN
20.0000 [IU] | Freq: Two times a day (BID) | SUBCUTANEOUS | Status: DC
  Administered 2017-06-07 – 2017-06-08 (×2): 20 [IU] via SUBCUTANEOUS
  Filled 2017-06-07 (×2): qty 0.2

## 2017-06-07 MED ORDER — INSULIN GLARGINE 100 UNIT/ML ~~LOC~~ SOLN
40.0000 [IU] | Freq: Two times a day (BID) | SUBCUTANEOUS | Status: DC
  Filled 2017-06-07: qty 0.4

## 2017-06-07 MED ORDER — ASPIRIN 81 MG PO CHEW
81.0000 mg | CHEWABLE_TABLET | Freq: Every day | ORAL | Status: DC
  Administered 2017-06-09 – 2017-06-10 (×2): 81 mg via ORAL
  Filled 2017-06-07: qty 1

## 2017-06-07 MED ORDER — PANTOPRAZOLE SODIUM 40 MG PO PACK
40.0000 mg | PACK | Freq: Every day | ORAL | Status: DC
  Filled 2017-06-07: qty 20

## 2017-06-07 MED ORDER — HYDRALAZINE HCL 25 MG PO TABS: 12.5000 mg | ORAL_TABLET | Freq: Three times a day (TID) | ORAL | Status: DC

## 2017-06-07 MED ORDER — INSULIN GLARGINE 100 UNIT/ML ~~LOC~~ SOLN: 20.0000 [IU] | Freq: Two times a day (BID) | SUBCUTANEOUS | Status: DC

## 2017-06-07 MED ORDER — MONTELUKAST SODIUM 10 MG PO TABS
10.0000 mg | ORAL_TABLET | Freq: Every day | ORAL | Status: DC
  Filled 2017-06-07 (×2): qty 1

## 2017-06-07 MED ORDER — DEXTROSE-NACL 5-0.45 % IV SOLN
INTRAVENOUS | Status: DC
  Administered 2017-06-07 – 2017-06-10 (×4): via INTRAVENOUS

## 2017-06-07 NOTE — Progress Notes (Signed)
[facility] Progress Note Patient Name: TANISHI NAULT DOB: 06/20/1941 MRN: [id number]   Date of Service  06/07/2017  HPI/Events of Note  K+ = 3.2 and Creatinine = 0.78.  eICU Interventions  Will replace K+.      Intervention Category Major Interventions: Electrolyte abnormality - evaluation and management  Sommer,Steven Eugene 06/07/2017, 6:21 AM

## 2017-06-07 NOTE — Progress Notes (Signed)
PULMONARY / CRITICAL CARE MEDICINE   Name: Kelli Velasquez MRN: [id number] DOB: [birth year]    ADMISSION DATE:  05/26/2017 CONSULTATION DATE:  Admitted 10/31 > extubated 11/4 and to SDU 11/5. 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  She had significant respiratory distress in ER and failed trial bipap requiring intubation.  Initial w/u revealed BNP >700, WBC 21.4 (chronic steroids), hypercarbic respiratory failure with pH 7.211, PCO2 99.4. She required intubation and responded well to diuresis. She was extubated 11/4 and transferred out to SDU the following day. Further diuresis limited by arrhythmia and electrolyte abnormality. Failed swallow eval.    PAST MEDICAL HISTORY :  She  has a past medical history of Allergy, Asthma, Chronic bronchitis ([place]), Coronary artery disease, Dyspnea, Full dentures, Gallstones, GERD (gastroesophageal reflux disease), History of echocardiogram, History of nuclear stress test, Hyperlipidemia, Hypertension, Myocardial infarction ([place]), Myopia, Neoplasm of skin, Nocturnal oxygen desaturation, Obesity, Osteoarthritis, Overactive bladder, Oxygen deficiency, Rash, Retaining fluid, Urinary incontinence, Vertigo, and Wears glasses.  PAST SURGICAL HISTORY: She  has a past surgical history that includes Total knee arthroplasty (2010); vocal cord polypectomy; Cholecystectomy (92); Joint replacement (2007); Dilation and curettage of uterus (1984); Colonoscopy; Coronary angioplasty with stent (07/2009); CATARACT EXTRACTION PHACO AND INTRAOCULAR LENS PLACEMENT (IOC)  Left  Complicated (Left, 11/02/8117); CATARACT EXTRACTION PHACO AND INTRAOCULAR LENS PLACEMENT (IOC)  right (Right, 12/16/2016); VIDEO BRONCHOSCOPY WITH FLUORO (Bilateral, 07/05/2013);  HERNIA REPAIR INFRAUMILICAL INCISIONAL (N/A, 05/16/2013); and INSERTION OF MESH (N/A, 05/16/2013).  Allergies  Allergen Reactions  . Fish Allergy Anaphylaxis  . Shellfish Allergy Anaphylaxis  . Aspirin     REACTION: nausea and vomiting High doses  . Atorvastatin     REACTION: leg pain  . Crestor [Rosuvastatin Calcium] Other (See Comments)    Muscle pain - allergy/intolerance  . Penicillins     Due to mold allergy per pt  . Simvastatin     REACTION: muscle pain  . Trandolapril     REACTION: leg pain    No current facility-administered medications on file prior to encounter.    Current Outpatient Medications on File Prior to Encounter  Medication Sig  . acetaminophen (TYLENOL) 325 MG tablet Take 650 mg by mouth every 6 (six) hours as needed for mild pain. Take as needed per bottle   . acetaminophen-codeine (TYLENOL #3) 300-30 MG tablet One every 4 hours as needed for cough  . amLODipine (NORVASC) 10 MG tablet Take 1 tablet (10 mg total) by mouth daily.  Marland Kitchen aspirin 81 MG tablet Take 81 mg by mouth daily.  . chlorpheniramine (CHLOR-TRIMETON) 4 MG tablet Take 4 mg by mouth every morning.   . cromolyn (NASALCROM) 5.2 MG/ACT nasal spray Place 2 sprays into both nostrils daily as needed for allergies.  . cyanocobalamin (,VITAMIN B-12,) 1000 MCG/ML injection Inject 1,000 mcg into the muscle every 3 (three) months.  . Cyanocobalamin (VITAMIN B12) 3000 MCG SUBL Place 1 capsule under the tongue daily.  Marland Kitchen dextromethorphan-guaiFENesin (MUCINEX DM) 30-600 MG 12hr tablet Take 1 tablet by mouth 2 (two) times daily as needed for cough.  . EPIPEN 2-PAK 0.3 MG/0.3ML SOAJ injection INJECT 0.3 MLS (0.3 MG TOTAL) INTO THE MUSCLE ONCE AS NEEDED FOR ALLERGIC REACTION  . esomeprazole (NEXIUM) 40 MG capsule Take 1 capsule (  40 mg total) by mouth daily.  Marland Kitchen ezetimibe (ZETIA) 10 MG tablet TAKE 1 TABLET (10 MG TOTAL) BY MOUTH DAILY.  . furosemide (LASIX) 20 MG tablet TAKE 1 TABLET BY MOUTH EVERY DAY  .  furosemide (LASIX) 40 MG tablet Take 1 tablet (40 mg total) by mouth daily. Take 1 daily as needed for extra swelling (Patient taking differently: Take 40 mg by mouth daily as needed for fluid. Take 1 daily as needed for extra swelling )  . montelukast (SINGULAIR) 10 MG tablet Take 1 tablet (10 mg total) by mouth at bedtime.  . Multiple Vitamin (MULTIVITAMIN) capsule Take 1 capsule by mouth daily.  . OXYGEN Inhale 3 L into the lungs continuous. 24/7 2 lpm  [employer]  . potassium chloride SA (KLOR-CON M20) 20 MEQ tablet Take 1 tablet (20 mEq total) by mouth daily as needed. (Patient taking differently: Take 20 mEq by mouth daily. )  . PREDNISONE PO Take 15-20 mg by mouth daily. Take 20 mg one day and 15 mg the next days  . PROAIR HFA 108 (90 BASE) MCG/ACT inhaler INHALE 2 PUFFS BY MOUTH EVERY 4 HOURS AS NEEDED FOR WHEEZING OR FOR SHORTNESS OF BREATH  . ranitidine (ZANTAC) 75 MG tablet Take 300 mg by mouth at bedtime.     FAMILY HISTORY:  Her indicated that her mother is deceased. She indicated that her father is deceased. She indicated that the status of her maternal grandmother is unknown. She indicated that the status of her neg hx is unknown.  SOCIAL HISTORY: She  reports that she quit smoking about 34 years ago. Her smoking use included cigarettes. She has a 25.00 pack-year smoking history. she has never used smokeless tobacco. She reports that she drinks alcohol. She reports that she does not use drugs.  REVIEW OF SYSTEMS:   Patient resting comfortably, sedated.   SUBJECTIVE:  Ventilated, unable to provide.   VITAL SIGNS: BP (!) 151/72   Pulse (!) 36   Temp 97.8 F (36.6 C) (Oral)   Resp (!) 21   Ht _0  (1.626 m)   Wt 241 lb 6.5 oz (109.5 kg)   LMP 07/27/1980   SpO2 97%   BMI 41.44 kg/m   HEMODYNAMICS: CVP:  [6 mmHg-8 mmHg] 8 mmHg  VENTILATOR SETTINGS: Vent Mode: PRVC FiO2 (%):  [30 %-40 %] 30 % Set Rate:  [20 bmp] 20 bmp Vt Set:  [460 mL] 460 mL PEEP:  [5 cmH20] 5  cmH20 Pressure Support:  [10 cmH20] 10 cmH20 Plateau Pressure:  [13 cmH20-19 cmH20] 18 cmH20  INTAKE / OUTPUT: I/O last 3 completed shifts: In: 4937.7 [I.V.:1727.7; NG/GT:3110; IV Piggyback:100] Out: 2400 [Urine:2400]  PHYSICAL EXAMINATION: General:  Obese female lying in bed, on vent.  Neuro: Sleeping peacefully.  HEENT:  Normocephalic atraumatic, unable to appreciate JVD 2/2 body habitus Cardiovascular:  Irregular rhythm, normal rate, no MRG Lungs:  Good air movement, coarse breath sounds throughout.  Abdomen:  SNTND, +BS Musculoskeletal:  WWP, minimal BLE.  Skin:  Intact, warm, well perfused.   LABS:  BMET Recent Labs  Lab 06/05/17 0404 06/06/17 0505 06/07/17 0514  NA 151* 151* 150*  K 3.6 3.5 3.2*  CL 111 113* 112*  CO2 32 32 31  BUN 55* 45* 44*  CREATININE 1.06* 0.88 0.78  GLUCOSE 241* 253* 271*    Electrolytes Recent Labs  Lab 06/05/17 0404 06/06/17 0504 06/06/17 0505 06/07/17 0514  CALCIUM 9.2  --  8.5* 8.6*  MG 2.3 2.0  --  2.1  PHOS 3.0  --  3.3 2.6    CBC Recent Labs  Lab 06/05/17 0404 06/06/17 0504 06/07/17 0514  WBC 13.8* 16.6* 18.7*  HGB 12.0 11.2* 10.8*  HCT 41.6 38.6 37.1  PLT 130* 102* 95*    Coag's Recent Labs  Lab 06/02/17 0159  INR 1.11    Sepsis Markers Recent Labs  Lab 06/02/17 0911 06/02/17 1230 06/02/17 1830 06/02/17 2146 06/03/17 0500 06/04/17 0540  LATICACIDVEN  --  1.9 2.3* 2.4*  --   --   PROCALCITON <0.10  --   --   --  <0.10 <0.10    ABG Recent Labs  Lab 06/02/17 0140 06/02/17 0400  PHART 7.291* 7.482*  PCO2ART 85.6* 47.1  PO2ART 82.9* 266*    Liver Enzymes Recent Labs  Lab 06/02/17 0346 06/05/17 0404 06/06/17 0505 06/07/17 0514  AST 20  --   --   --   ALT 24  --   --   --   ALKPHOS 64  --   --   --   BILITOT 1.2  --   --   --   ALBUMIN 3.0* 2.5* 2.3* 2.2*    Cardiac Enzymes Recent Labs  Lab 06/02/17 0346 06/02/17 0911 06/02/17 1513  TROPONINI 0.06* 0.05* 0.05*     Glucose Recent Labs  Lab 06/06/17 0743 06/06/17 1118 06/06/17 1602 06/06/17 1956 06/07/17 0015 06/07/17 0350  GLUCAP 241* 258* 233* 152* 235* 251*    Imaging Dg Chest Port 1 View  Result Date: 06/07/2017 CLINICAL DATA:  Acute respiratory failure with hypoxia. EXAM: PORTABLE CHEST 1 VIEW COMPARISON:  One-view chest x-ray 06/03/2017 FINDINGS: The heart is enlarged. Aortic atherosclerosis is again seen. Endotracheal tube is stable, 4.5 cm above the carina. The NG tube courses off the inferior border of the film. Mild pulmonary vascular congestion and left greater than right pleural effusions are stable. Associated atelectasis is noted. Infection or aspiration at the left base is not excluded, although there is no significant change. IMPRESSION: 1. Stable appearance of cardiomegaly with mild pulmonary vascular congestion and left greater than right pleural effusions. This likely represents congestive heart failure. 2. Left basilar airspace disease likely represents atelectasis. Infection or aspiration is not excluded. 3. Support apparatus is stable. 4. Aortic atherosclerosis. Electronically Signed   By: San Morelle M.D.   On: 06/07/2017 07:03    STUDIES:  Echo 10/31>>> EF 35-40%, diffuse hypokinesis CT chest 11/7 read pending > RLL consolidation, no appreciable effusion    CULTURES: BCx2 11/7 > BAL 11/7 > AFB smear negative Viral sputum culture negative at 24 hours.   ANTIBIOTICS: Diflucan (Oral thrush) 11/3>>   Ceftazidine 11/7 > Vancomycin 11/7 > 11/8  SIGNIFICANT EVENTS: Admit 10/31 11/4 extubate 11/5 transfer SDU 11/7 transfer ICU reintubated.  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 Improved initially with diuresis and was extuabted successfully, however, two days later 11/7 became hypoxemic/hypercarbic requiring  reintubation.   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 Echo with EF 35-40% and  diffuse hypokinesis Troponin elevation on initial admit, decreased 11/7 Hx HTN.  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 New thrombocytopenia > HITT panel ordered P:  -daily CBC -follow up HITT panel   INFECTIOUS A: MSSA PNA purulent secretions noted during intubation. Bilateral infiltrates. PCT negative.  Oral thrush Plan:  -continue ceftriaxone (day #6) -Follow BC and BAL -Diflucan started 11/3 for oral thrush -Follow fever curve and WBC  ENDOCRINE Hyperglycemia > used 47U SSI last 24 hrs P: -CBG Q 4   -resistant SSI 2/2 steroids -Lantus 35U BID  NEUROLOGIC A: Acute metabolic encephalopathy:  Hypoxemic, hypercarbia P: -Ventilate to correct metabolic abnormalities -Fentanyl infusion and PRN for sedation/analgesia -haldol PRN for agitation -precedex drip  FAMILY  - Updates: no family at bedside on my exam.  - Inter-disciplinary family meet or Palliative Care meeting due by: 11/13   KRalene Ok MD PGY-2, [facility] 06/07/2017 7:27 AM

## 2017-06-07 NOTE — Progress Notes (Signed)
[facility] Progress Note Patient Name: Kelli Velasquez DOB: 28-Oct-1940 MRN: [id number]   Date of Service  06/07/2017  HPI/Events of Note  Multiple issues: 1. Hypertension - BP = 189/85. Patient is now NPO. Na+ = 150.and 2. Blood glucose = 77. NGT pulled out and enteral feeds have stopped.   eICU Interventions  Will order: 1. D5 0.45 NaCl to run IV at 75 mL/hour. 2. Hydralazine 10 mg IV Q 4 hours PRN SBP > 170.     Intervention Category Major Interventions: Hypertension - evaluation and management;Other:  Laquon Emel Cornelia Copa 06/07/2017, 8:16 PM

## 2017-06-07 NOTE — Progress Notes (Signed)
Inpatient Diabetes Program Recommendations  AACE/ADA: New Consensus Statement on Inpatient Glycemic Control (2015)  Target Ranges:  Prepandial:   less than 140 mg/dL      Peak postprandial:   less than 180 mg/dL (1-2 hours)      Critically ill patients:  140 - 180 mg/dL   Lab Results  Component Value Date   GLUCAP 203 (H) 06/07/2017   HGBA1C 6.3 02/01/2017    Review of Glycemic ControlResults for CYNDI, MONTEJANO (MRN [id number]) as of 06/07/2017 10:39  Ref. Range 06/06/2017 16:02 06/06/2017 19:56 06/07/2017 00:15 06/07/2017 03:50 06/07/2017 07:54  Glucose-Capillary Latest Ref Range: 65 - 99 mg/dL 233 (H) 152 (H) 235 (H) 251 (H) 203 (H)    Diabetes history: None-hyperglycemia Outpatient Diabetes medications: none Current orders for Inpatient glycemic control:  Novolog resistant q 4 hours, Lantus 40 units bid Solumedrol 40 mg IV q 12 hours Inpatient Diabetes Program Recommendations:    Note Lantus increased today. If blood sugars remain elevated, please consider adding Novolog tube feed coverage 3 units q 4 hours.  As steroids are tapered, patient will need reduction in insulin.  Thanks, Adah Perl, RN, BC-ADM Inpatient Diabetes Coordinator Pager [phone] (8a-5p)

## 2017-06-07 NOTE — Procedures (Signed)
Extubation Procedure Note  Patient Details:   Name: Kelli Velasquez DOB: [birth year] MRN: [id number]   Airway Documentation:     Evaluation  O2 sats: stable throughout Complications: No apparent complications Patient did tolerate procedure well. Bilateral Breath Sounds: Diminished, Other (Comment)(coarse)   Yes   Patient extubated to [place]. Vital signs stable at this time. No complications. Husband and RN at bedside. RT will continue to monitor.  Mcneil Sober 06/07/2017, 11:48 AM

## 2017-06-07 NOTE — Progress Notes (Signed)
[facility] Progress Note Patient Name: Kelli Velasquez DOB: 1940-12-31 MRN: [id number]   Date of Service  06/07/2017  HPI/Events of Note  Blood glucose after 2 hours on D5 infusion = 109.   eICU Interventions  Will decrease Lantus from 40 units BID to 20 units BID.     Intervention Category Major Interventions: Other:  Lysle Dingwall 06/07/2017, 10:24 PM

## 2017-06-07 NOTE — Progress Notes (Signed)
[facility] ADULT ICU REPLACEMENT PROTOCOL FOR AM LAB REPLACEMENT ONLY  The patient does not apply for the [facility] Adult ICU Electrolyte Replacment Protocol based on the criteria listed below:    Is urine output >/= 0.5 ml/kg/hr for the last 6 hours? No. Patient's UOP is 0 ml/kg/hr   Abnormal electrolyte(s):K3.2  If a panic level lab has been reported, has the CCM MD in charge been notified? Yes.  .   Physician:  Levada Schilling , MD  Vear Clock 06/07/2017 6:14 AM

## 2017-06-07 NOTE — Progress Notes (Signed)
Patient ID: Kelli Velasquez, female   DOB: 10/23/1940, 76 y.o.   MRN: [id number]      Advanced Heart Failure Rounding Note  Subjective:    Over 2 weeks prior to admission, she had increased dyspnea with exertion, she was short of breath at rest on the day of admission.  Her husband says she has been taking 20 mg of lasix daily and occasionally an extra 20 mg of lasix.   Presented to [facility] ED with respiratory distress. Family reported increased dyspnea over the last week requiring 2 liters oxygen at home. In ED she received solumedrol and duonebs. Placed on Bipap but continue decline requiring intubation. CXR concerning for interstitial edema and bilateral effusions.  Extubated 11/4. Noted to have swelling of upper airways on speech/swallow evaluation 11/6.  Developed respiratory distress 11/6 in the evening with hypoxia and hypercapnia and was re-intubated.    Remains intubated. Off lasix for the last few days. CVP 6-7.   CXR 06/03/17 with bibasilar opacities suggesting left pleural effusion and atelectasis.   Studies:  Echo 05/26/2017 EF 35-40%  Grade I DD RV normal Echo 2017 EF 45-50% with WMA Myoview 2017 No significant ischemia EF 48%.  [place] 2011- DES LAD and RCA.   Objective:   Weight Range: 241 lb 6.5 oz (109.5 kg) Body mass index is 41.44 kg/m.   Vital Signs:   Temp:  [97.8 F (36.6 C)-99.4 F (37.4 C)] 97.9 F (36.6 C) (11/12 0752) Pulse Rate:  [36-100] 100 (11/12 0920) Resp:  [14-21] 20 (11/12 0920) BP: (93-164)/(48-115) 128/75 (11/12 0900) SpO2:  [93 %-99 %] 93 % (11/12 0920) FiO2 (%):  [30 %] 30 % (11/12 0920) Weight:  [241 lb 6.5 oz (109.5 kg)] 241 lb 6.5 oz (109.5 kg) (11/12 0404) Last BM Date: 06/06/17  Weight change: Filed Weights   06/05/17 0431 06/06/17 0500 06/07/17 0404  Weight: 239 lb 3.2 oz (108.5 kg) 241 lb 10 oz (109.6 kg) 241 lb 6.5 oz (109.5 kg)   Intake/Output:   Intake/Output Summary (Last 24 hours) at 06/07/2017 1020 Last data filed at 06/07/2017  0900 Gross per 24 hour  Intake 3047.7 ml  Output 1150 ml  Net 1897.7 ml    Physical Exam  CVP 6-7 personally checked.  General:  Intubated sedated.  HEENT: + ETT Neck: supple. no JVD. Carotids 2+ bilat; no bruits. No lymphadenopathy or thryomegaly appreciated. Cor: PMI nondisplaced. Regular rate & rhythm. No rubs, gallops or murmurs. Lungs: decreased throughout.  Abdomen: obese, soft, nontender, nondistended. No hepatosplenomegaly. No bruits or masses. Good bowel sounds. Extremities: no cyanosis, clubbing, rash, edema. RUE pICC  Neuro: intubated/sedated.  GU: foley   Telemetry   NSR 70-90s personally reviewed.   Labs    CBC Recent Labs    06/06/17 0504 06/07/17 0514  WBC 16.6* 18.7*  NEUTROABS 15.0* 17.4*  HGB 11.2* 10.8*  HCT 38.6 37.1  MCV 102.4* 102.8*  PLT 102* 95*   Basic Metabolic Panel Recent Labs    06/06/17 0504 06/06/17 0505 06/07/17 0514  NA  --  151* 150*  K  --  3.5 3.2*  CL  --  113* 112*  CO2  --  32 31  GLUCOSE  --  253* 271*  BUN  --  45* 44*  CREATININE  --  0.88 0.78  CALCIUM  --  8.5* 8.6*  MG 2.0  --  2.1  PHOS  --  3.3 2.6   Liver Function Tests Recent Labs    06/06/17  0505 06/07/17 0514  ALBUMIN 2.3* 2.2*   No results for input(s): LIPASE, AMYLASE in the last 72 hours. Cardiac Enzymes No results for input(s): CKTOTAL, CKMB, CKMBINDEX, TROPONINI in the last 72 hours. BNP: BNP (last 3 results) Recent Labs    05/26/17 0610  BNP 760.9*   ProBNP (last 3 results) Recent Labs    01/11/17 1053  PROBNP 264.0*   D-Dimer No results for input(s): DDIMER in the last 72 hours. Hemoglobin A1C No results for input(s): HGBA1C in the last 72 hours. Fasting Lipid Panel No results for input(s): CHOL, HDL, LDLCALC, TRIG, CHOLHDL, LDLDIRECT in the last 72 hours. Thyroid Function Tests No results for input(s): TSH, T4TOTAL, T3FREE, THYROIDAB in the last 72 hours.  Invalid input(s): FREET3  Other results:  Imaging   Dg Chest  Port 1 View  Result Date: 06/07/2017 CLINICAL DATA:  Acute respiratory failure with hypoxia. EXAM: PORTABLE CHEST 1 VIEW COMPARISON:  One-view chest x-ray 06/03/2017 FINDINGS: The heart is enlarged. Aortic atherosclerosis is again seen. Endotracheal tube is stable, 4.5 cm above the carina. The NG tube courses off the inferior border of the film. Mild pulmonary vascular congestion and left greater than right pleural effusions are stable. Associated atelectasis is noted. Infection or aspiration at the left base is not excluded, although there is no significant change. IMPRESSION: 1. Stable appearance of cardiomegaly with mild pulmonary vascular congestion and left greater than right pleural effusions. This likely represents congestive heart failure. 2. Left basilar airspace disease likely represents atelectasis. Infection or aspiration is not excluded. 3. Support apparatus is stable. 4. Aortic atherosclerosis. Electronically Signed   By: San Morelle M.D.   On: 06/07/2017 07:03    Medications:     Scheduled Medications: . aspirin  81 mg Per Tube Daily  . chlorhexidine gluconate (MEDLINE KIT)  15 mL Mouth Rinse BID  . Chlorhexidine Gluconate Cloth  6 each Topical Q0600  . ezetimibe  10 mg Per Tube QHS  . feeding supplement (VITAL HIGH PROTEIN)  1,000 mL Per Tube Q24H  . free water  200 mL Per Tube Q8H  . guaiFENesin  10 mL Per Tube Q8H  . haloperidol lactate  5 mg Intravenous Q6H  . hydrALAZINE  10 mg Per Tube TID  . insulin aspart  0-20 Units Subcutaneous Q4H  . insulin glargine  40 Units Subcutaneous BID  . ipratropium-albuterol  3 mL Nebulization Q6H  . isosorbide dinitrate  10 mg Per Tube TID  . mouth rinse  15 mL Mouth Rinse QID  . methylPREDNISolone (SOLU-MEDROL) injection  40 mg Intravenous Q12H  . montelukast  10 mg Per Tube QHS  . pantoprazole sodium  40 mg Per Tube Daily  . potassium chloride  40 mEq Per Tube Once  . sodium chloride flush  10-40 mL Intracatheter Q12H  .  sodium chloride flush  3 mL Intravenous Q12H    Infusions: . sodium chloride Stopped (06/05/17 0937)  . sodium chloride 10 mL/hr at 06/07/17 0600  . sodium chloride    . cefTRIAXone (ROCEPHIN)  IV Stopped (06/06/17 2230)  . dexmedetomidine (PRECEDEX) IV infusion 1 mcg/kg/hr (06/07/17 1006)  . 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  Admitted  with acute/chronic respiratory failure  Assessment/Plan   1. Acute/chronic systolic CHF: Probably ischemic cardiomyopathy given prior history.  Echo this admission with EF down to 35-40% range, around 50% in past.  She was admitted with worsening dyspnea, elevated BNP, CXR with pulmonary edema. PCT not elevated.  Has history of BOOP.  Volume status improved and extubated 11/4.  However, on 11/6 developed respiratory distress and was intubated again.  My suspicion here is more HCAP versus aspiration versus BOOP flare than pulmonary edema.  - We had planned right/left heart cath, but with ongoing significant BUN elevation and re-intubation, will hold off for now.   - CVP 6-7. Hold lasix for now.  - Off spiro since 11/7.  -  Hold off on losartan for now.  - Use hydralazine/nitrates if BP control is needed.  - She has not tolerated beta blockers in the past due to wheezing, would consider starting low dose of beta-1 specific bisoprolol when respiratory status improved. Would not start yet. - With fall in EF, would plan left/right heart cath once she is extubated and stable.  2. Acute hypoxemic respiratory failure: Initially thought multifactorial due to pulmonary edema, effusions, BOOP and OHS.  She was on IV Solumedrol, now  prednisone given BOOP with ?component of exacerbation.  She was diuresed.  At baseline, she is on 2L home oxygen.  Extubated 11/4 but developed respiratory distress 11/6 and re-intubated.  She had had swelling of the upper airways noted by speech/swallow exam.  Found to have MSSA PNA.  She is now on ceftriaxone.  - Antibiotics/steroids  per CCM.    3. CAD: h/o DES to LAD and RCA in 2011.  No chest pain but admitted with severe dyspnea/volume overload and fall in EF on echo.  Troponin mildly elevated but no trend. Suspect most likely demand ischemia from volume overload.  - Given fall in EF, would plan cath when creatinine stabilizes [facility]).  Had planned 06/02/17 but re-intubated so will hold off. 4.  AKI:  - Resolved.  5. HTN:  - Elevated. Increase hydralazine 12.5 mg three times a day.  6. Hypokalemia K 3.2 . Supplement potassium.  7. Code status - Family wishes DNI if she makes it to extubation this time.  8. NSVT -Mag 2.1 K 3.2 Supp K.    Length of Stay: [place], NP  06/07/2017, 10:20 AM  Advanced Heart Failure Team Pager [phone] (M-F; [place])  Please contact [facility] for night-coverage after hours (4p -7a ) and weekends on [email or web address]  Patient seen with NP, agree with the above note.  She was extubated today, doing well so far.  CVP 6-7.  Lungs sound clear on exam.  - No diuretic needed currently.   MSSA PNA, now on ceftriaxone.   If she remains stable and creatinine stays down, would consider RHC/LHC prior to discharge.   Loralie Champagne 06/07/2017 1:06 PM

## 2017-06-07 NOTE — Progress Notes (Signed)
Wasted 225 mL fentanyl in sink with Loma Newton, RN

## 2017-06-08 ENCOUNTER — Inpatient Hospital Stay (HOSPITAL_COMMUNITY): Payer: Medicare Other

## 2017-06-08 LAB — GLUCOSE, CAPILLARY
GLUCOSE-CAPILLARY: 131 mg/dL — AB (ref 65–99)
GLUCOSE-CAPILLARY: 145 mg/dL — AB (ref 65–99)
GLUCOSE-CAPILLARY: 204 mg/dL — AB (ref 65–99)
GLUCOSE-CAPILLARY: 310 mg/dL — AB (ref 65–99)
GLUCOSE-CAPILLARY: 83 mg/dL (ref 65–99)
GLUCOSE-CAPILLARY: 95 mg/dL (ref 65–99)

## 2017-06-08 LAB — RENAL FUNCTION PANEL
ALBUMIN: 2.7 g/dL — AB (ref 3.5–5.0)
Anion gap: 7 (ref 5–15)
BUN: 33 mg/dL — AB (ref 6–20)
CALCIUM: 9.3 mg/dL (ref 8.9–10.3)
CO2: 31 mmol/L (ref 22–32)
Chloride: 112 mmol/L — ABNORMAL HIGH (ref 101–111)
Creatinine, Ser: 0.71 mg/dL (ref 0.44–1.00)
GFR calc Af Amer: >60 mL/min (ref >60)
GLUCOSE: 94 mg/dL (ref 65–99)
PHOSPHORUS: 2.9 mg/dL (ref 2.5–4.6)
POTASSIUM: 3.8 mmol/L (ref 3.5–5.1)
SODIUM: 150 mmol/L — AB (ref 135–145)

## 2017-06-08 LAB — CBC WITH DIFFERENTIAL/PLATELET
BAND NEUTROPHILS: 1 %
BASOS PCT: 0 %
BLASTS: 0 %
Basophils Absolute: 0 10*3/uL (ref 0.0–0.1)
EOS ABS: 0 10*3/uL (ref 0.0–0.7)
EOS PCT: 0 %
HEMATOCRIT: 44.8 % (ref 36.0–46.0)
HEMOGLOBIN: 13 g/dL (ref 12.0–15.0)
Lymphocytes Relative: 4 %
Lymphs Abs: 1.3 10*3/uL (ref 0.7–4.0)
MCH: 29.7 pg (ref 26.0–34.0)
MCHC: 29 g/dL — AB (ref 30.0–36.0)
MCV: 102.3 fL — ABNORMAL HIGH (ref 78.0–100.0)
METAMYELOCYTES PCT: 2 %
MONO ABS: 1.3 10*3/uL — AB (ref 0.1–1.0)
Monocytes Relative: 4 %
Myelocytes: 1 %
NEUTROS ABS: 29.8 10*3/uL — AB (ref 1.7–7.7)
NEUTROS PCT: 88 %
Other: 0 %
Platelets: 140 10*3/uL — ABNORMAL LOW (ref 150–400)
Promyelocytes Absolute: 0 %
RBC: 4.38 MIL/uL (ref 3.87–5.11)
RDW: 16 % — AB (ref 11.5–15.5)
WBC: 32.4 10*3/uL — AB (ref 4.0–10.5)
nRBC: 0 /100 WBC

## 2017-06-08 LAB — MAGNESIUM: Magnesium: 2.4 mg/dL (ref 1.7–2.4)

## 2017-06-08 MED ORDER — INSULIN GLARGINE 100 UNIT/ML ~~LOC~~ SOLN
20.0000 [IU] | Freq: Every day | SUBCUTANEOUS | Status: DC
  Administered 2017-06-09 – 2017-06-12 (×4): 20 [IU] via SUBCUTANEOUS
  Filled 2017-06-08 (×5): qty 0.2

## 2017-06-08 MED ORDER — FUROSEMIDE 10 MG/ML IJ SOLN
40.0000 mg | Freq: Once | INTRAMUSCULAR | Status: AC
  Administered 2017-06-08: 40 mg via INTRAVENOUS
  Filled 2017-06-08: qty 4

## 2017-06-08 MED ORDER — HYDRALAZINE HCL 25 MG PO TABS
25.0000 mg | ORAL_TABLET | Freq: Three times a day (TID) | ORAL | Status: DC
  Filled 2017-06-08: qty 1

## 2017-06-08 MED ORDER — JEVITY 1.2 CAL PO LIQD
1000.0000 mL | ORAL | Status: DC
  Administered 2017-06-08 – 2017-06-22 (×13): 1000 mL
  Filled 2017-06-08 (×27): qty 1000

## 2017-06-08 MED ORDER — HYDRALAZINE HCL 20 MG/ML IJ SOLN
10.0000 mg | Freq: Three times a day (TID) | INTRAMUSCULAR | Status: DC
  Administered 2017-06-08 – 2017-06-09 (×4): 10 mg via INTRAVENOUS
  Filled 2017-06-08 (×3): qty 0.5
  Filled 2017-06-08 (×2): qty 1

## 2017-06-08 MED ORDER — SPIRONOLACTONE 12.5 MG HALF TABLET
12.5000 mg | ORAL_TABLET | Freq: Every day | ORAL | Status: DC
  Administered 2017-06-08 – 2017-06-10 (×3): 12.5 mg via ORAL
  Filled 2017-06-08 (×3): qty 1

## 2017-06-08 MED ORDER — EZETIMIBE 10 MG PO TABS
10.0000 mg | ORAL_TABLET | Freq: Every day | ORAL | Status: DC
  Administered 2017-06-08 – 2017-06-21 (×13): 10 mg
  Filled 2017-06-08 (×13): qty 1

## 2017-06-08 MED ORDER — JEVITY 1.2 CAL PO LIQD
1000.0000 mL | ORAL | Status: DC
  Filled 2017-06-08 (×2): qty 1000

## 2017-06-08 MED ORDER — PRO-STAT SUGAR FREE PO LIQD
30.0000 mL | Freq: Three times a day (TID) | ORAL | Status: DC
  Administered 2017-06-08 – 2017-06-23 (×42): 30 mL
  Filled 2017-06-08 (×44): qty 30

## 2017-06-08 MED ORDER — MONTELUKAST SODIUM 10 MG PO TABS
10.0000 mg | ORAL_TABLET | Freq: Every day | ORAL | Status: DC
  Administered 2017-06-08 – 2017-06-21 (×13): 10 mg
  Filled 2017-06-08 (×13): qty 1

## 2017-06-08 MED ORDER — HYDRALAZINE HCL 25 MG PO TABS
25.0000 mg | ORAL_TABLET | Freq: Three times a day (TID) | ORAL | Status: DC
  Filled 2017-06-08 (×4): qty 1

## 2017-06-08 MED ORDER — ISOSORBIDE DINITRATE 10 MG PO TABS
10.0000 mg | ORAL_TABLET | Freq: Three times a day (TID) | ORAL | Status: DC
  Administered 2017-06-08: 10 mg
  Filled 2017-06-08 (×2): qty 1

## 2017-06-08 MED ORDER — PANTOPRAZOLE SODIUM 40 MG IV SOLR
40.0000 mg | INTRAVENOUS | Status: DC
  Administered 2017-06-08 – 2017-06-09 (×2): 40 mg via INTRAVENOUS
  Filled 2017-06-08: qty 40

## 2017-06-08 NOTE — Progress Notes (Signed)
PULMONARY / CRITICAL CARE MEDICINE   Name: Kelli Velasquez MRN: [id number] DOB: [birth year]    ADMISSION DATE:  05/26/2017 CONSULTATION DATE:  Admitted 10/31 > extubated 11/4 and to SDU 11/5. 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  She had significant respiratory distress in ER and failed trial bipap requiring intubation.  Initial w/u revealed BNP >700, WBC 21.4 (chronic steroids), hypercarbic respiratory failure with pH 7.211, PCO2 99.4. She required intubation and responded well to diuresis. She was extubated 11/4 and transferred out to SDU the following day. Further diuresis limited by arrhythmia and electrolyte abnormality. Failed swallow eval.    PAST MEDICAL HISTORY :  She  has a past medical history of Allergy, Asthma, Chronic bronchitis ([place]), Coronary artery disease, Dyspnea, Full dentures, Gallstones, GERD (gastroesophageal reflux disease), History of echocardiogram, History of nuclear stress test, Hyperlipidemia, Hypertension, Myocardial infarction ([place]), Myopia, Neoplasm of skin, Nocturnal oxygen desaturation, Obesity, Osteoarthritis, Overactive bladder, Oxygen deficiency, Rash, Retaining fluid, Urinary incontinence, Vertigo, and Wears glasses.  PAST SURGICAL HISTORY: She  has a past surgical history that includes Total knee arthroplasty (2010); vocal cord polypectomy; Cholecystectomy (92); Joint replacement (2007); Dilation and curettage of uterus (1984); Colonoscopy; Coronary angioplasty with stent (07/2009); CATARACT EXTRACTION PHACO AND INTRAOCULAR LENS PLACEMENT (IOC)  Left  Complicated (Left, 7/32/2025); CATARACT EXTRACTION PHACO AND INTRAOCULAR LENS PLACEMENT (IOC)  right (Right, 12/16/2016); VIDEO BRONCHOSCOPY WITH FLUORO (Bilateral, 07/05/2013);  HERNIA REPAIR INFRAUMILICAL INCISIONAL (N/A, 05/16/2013); and INSERTION OF MESH (N/A, 05/16/2013).  Allergies  Allergen Reactions  . Fish Allergy Anaphylaxis  . Shellfish Allergy Anaphylaxis  . Aspirin     REACTION: nausea and vomiting High doses  . Atorvastatin     REACTION: leg pain  . Crestor [Rosuvastatin Calcium] Other (See Comments)    Muscle pain - allergy/intolerance  . Penicillins     Due to mold allergy per pt  . Simvastatin     REACTION: muscle pain  . Trandolapril     REACTION: leg pain    No current facility-administered medications on file prior to encounter.    Current Outpatient Medications on File Prior to Encounter  Medication Sig  . acetaminophen (TYLENOL) 325 MG tablet Take 650 mg by mouth every 6 (six) hours as needed for mild pain. Take as needed per bottle   . acetaminophen-codeine (TYLENOL #3) 300-30 MG tablet One every 4 hours as needed for cough  . amLODipine (NORVASC) 10 MG tablet Take 1 tablet (10 mg total) by mouth daily.  Marland Kitchen aspirin 81 MG tablet Take 81 mg by mouth daily.  . chlorpheniramine (CHLOR-TRIMETON) 4 MG tablet Take 4 mg by mouth every morning.   . cromolyn (NASALCROM) 5.2 MG/ACT nasal spray Place 2 sprays into both nostrils daily as needed for allergies.  . cyanocobalamin (,VITAMIN B-12,) 1000 MCG/ML injection Inject 1,000 mcg into the muscle every 3 (three) months.  . Cyanocobalamin (VITAMIN B12) 3000 MCG SUBL Place 1 capsule under the tongue daily.  Marland Kitchen dextromethorphan-guaiFENesin (MUCINEX DM) 30-600 MG 12hr tablet Take 1 tablet by mouth 2 (two) times daily as needed for cough.  . EPIPEN 2-PAK 0.3 MG/0.3ML SOAJ injection INJECT 0.3 MLS (0.3 MG TOTAL) INTO THE MUSCLE ONCE AS NEEDED FOR ALLERGIC REACTION  . esomeprazole (NEXIUM) 40 MG capsule Take 1 capsule (  40 mg total) by mouth daily.  Marland Kitchen ezetimibe (ZETIA) 10 MG tablet TAKE 1 TABLET (10 MG TOTAL) BY MOUTH DAILY.  . furosemide (LASIX) 20 MG tablet TAKE 1 TABLET BY MOUTH EVERY DAY  .  furosemide (LASIX) 40 MG tablet Take 1 tablet (40 mg total) by mouth daily. Take 1 daily as needed for extra swelling (Patient taking differently: Take 40 mg by mouth daily as needed for fluid. Take 1 daily as needed for extra swelling )  . montelukast (SINGULAIR) 10 MG tablet Take 1 tablet (10 mg total) by mouth at bedtime.  . Multiple Vitamin (MULTIVITAMIN) capsule Take 1 capsule by mouth daily.  . OXYGEN Inhale 3 L into the lungs continuous. 24/7 2 lpm  [employer]  . potassium chloride SA (KLOR-CON M20) 20 MEQ tablet Take 1 tablet (20 mEq total) by mouth daily as needed. (Patient taking differently: Take 20 mEq by mouth daily. )  . PREDNISONE PO Take 15-20 mg by mouth daily. Take 20 mg one day and 15 mg the next days  . PROAIR HFA 108 (90 BASE) MCG/ACT inhaler INHALE 2 PUFFS BY MOUTH EVERY 4 HOURS AS NEEDED FOR WHEEZING OR FOR SHORTNESS OF BREATH  . ranitidine (ZANTAC) 75 MG tablet Take 300 mg by mouth at bedtime.     FAMILY HISTORY:  Her indicated that her mother is deceased. She indicated that her father is deceased. She indicated that the status of her maternal grandmother is unknown. She indicated that the status of her neg hx is unknown.  SOCIAL HISTORY: She  reports that she quit smoking about 34 years ago. Her smoking use included cigarettes. She has a 25.00 pack-year smoking history. she has never used smokeless tobacco. She reports that she drinks alcohol. She reports that she does not use drugs.  REVIEW OF SYSTEMS:   Denies CP, SOB, abd pain, extremity pain.   SUBJECTIVE:  Patient AOx3, hoarse. Denies SOB. Dry mouth, asking for swabs. States she slept well.   VITAL SIGNS: BP (!) 167/83   Pulse 93   Temp 98.7 F (37.1 C) (Oral)   Resp (!) 31   Ht _0  (1.626 m)   Wt 243 lb 2.7 oz (110.3 kg)   LMP 07/27/1980   SpO2 96%   BMI 41.74 kg/m   HEMODYNAMICS:    VENTILATOR SETTINGS: Vent Mode: PSV;CPAP FiO2 (%):  [30 %] 30 % PEEP:  [5 cmH20] 5 cmH20 Pressure Support:  [5  cmH20] 5 cmH20  INTAKE / OUTPUT: I/O last 3 completed shifts: In: 3191.8 [I.V.:1720.8; NG/GT:1371; IV Piggyback:100] Out: 2825 [Urine:2825]  PHYSICAL EXAMINATION: General:  Obese female lying in bed in NAD on [place].  Neuro: AOx3, CN II-XII grossly intact.  HEENT:  Normocephalic atraumatic, unable to appreciate JVD 2/2 body habitus Cardiovascular:  Irregular rhythm, normal rate, no MRG Lungs:  Good air movement, coarse breath sounds throughout.  Abdomen:  SNTND, +BS Musculoskeletal:  WWP, minimal BLE.  Skin:  Intact, warm, well perfused.   LABS:  BMET Recent Labs  Lab 06/06/17 0505 06/07/17 0514 06/08/17 0423  NA 151* 150* 150*  K 3.5 3.2* 3.8  CL 113* 112* 112*  CO2 32 31 31  BUN 45* 44* 33*  CREATININE 0.88 0.78 0.71  GLUCOSE 253* 271* 94    Electrolytes Recent Labs  Lab 06/06/17 0504 06/06/17 0505 06/07/17 0514 06/08/17 0423  CALCIUM  --  8.5* 8.6* 9.3  MG 2.0  --  2.1 2.4  PHOS  --  3.3 2.6 2.9  CBC Recent Labs  Lab 06/06/17 0504 06/07/17 0514 06/08/17 0423  WBC 16.6* 18.7* 32.4*  HGB 11.2* 10.8* 13.0  HCT 38.6 37.1 44.8  PLT 102* 95* 140*    Coag's Recent Labs  Lab 06/02/17 0159  INR 1.11    Sepsis Markers Recent Labs  Lab 06/02/17 0911 06/02/17 1230 06/02/17 1830 06/02/17 2146 06/03/17 0500 06/04/17 0540  LATICACIDVEN  --  1.9 2.3* 2.4*  --   --   PROCALCITON <0.10  --   --   --  <0.10 <0.10    ABG Recent Labs  Lab 06/02/17 0140 06/02/17 0400  PHART 7.291* 7.482*  PCO2ART 85.6* 47.1  PO2ART 82.9* 266*    Liver Enzymes Recent Labs  Lab 06/02/17 0346  06/06/17 0505 06/07/17 0514 06/08/17 0423  AST 20  --   --   --   --   ALT 24  --   --   --   --   ALKPHOS 64  --   --   --   --   BILITOT 1.2  --   --   --   --   ALBUMIN 3.0*   < > 2.3* 2.2* 2.7*   < > = values in this interval not displayed.    Cardiac Enzymes Recent Labs  Lab 06/02/17 0346 06/02/17 0911 06/02/17 1513  TROPONINI 0.06* 0.05* 0.05*     Glucose Recent Labs  Lab 06/07/17 1152 06/07/17 1533 06/07/17 2004 06/07/17 2221 06/07/17 2354 06/08/17 0356  GLUCAP 226* 95 77 109* 106* 83    Imaging No results found.  STUDIES:  Echo 10/31>>> EF 35-40%, diffuse hypokinesis CT chest 11/7 read pending > RLL consolidation, no appreciable effusion    CULTURES: BCx2 11/7 > final no growth BAL 11/7 >30,000 colonies normal respiratory flora AFB smear negative Viral sputum culture negative at 4 days.   ANTIBIOTICS: Diflucan (Oral thrush) 11/3>>   Ceftazidine 11/7 > Vancomycin 11/7 > 11/8  SIGNIFICANT EVENTS: Admit 10/31 11/4 extubate 11/5 transfer SDU 11/7 transfer ICU reintubated. 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 Improved initially with diuresis and was extuabted successfully, however, two days later 11/7 became hypoxemic/hypercarbic requiring reintubation. Extubated 11/12.   ASSESSMENT / PLAN:  PULMONARY A: Acute on chronic hypercarbic/hypoxemic respiratory failure Pulmonary edema Hx BOOP - on chronic steroids and 2L [place] at home MSSA PNA > ceftazidime, now ceftriaxone P:    -Solu-Medrol 40 mg IV q12 hrs -Continue duoneb, singular -Diuresis directed by advanced CHF service -attempt wean as able, daily SBTs -IV lasix 59m once given worsening WOB this am   CARDIOVASCULAR A: Acute on chronic systolic and diastolic CHF Net Negative>> 16 L on 11/7. Echo with EF 35-40% and diffuse hypokinesis Troponin elevation on initial admit, decreased 11/7 Hx HTN.  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 Worsened 11/13, likely 2/2 hemoconcentration.  New thrombocytopenia > HITT panel elevated P:  -daily CBC  INFECTIOUS A: MSSA PNA purulent secretions noted during intubation. Bilateral infiltrates. PCT negative.  Oral thrush Plan:  -continue ceftriaxone (day #7) for 10 day course -Follow BC and BAL -Diflucan started 11/3 for oral thrush -Follow fever curve and WBC  ENDOCRINE Hyperglycemia > used 47U SSI last 24 hrs P: -CBG Q 4   -resistant SSI 2/2 steroids -Lantus 20U QD while NPO -decrease D5 0.45% NaCL to 22m/hr  NEUROLOGIC A: Acute metabolic encephalopathy, resolved. P: -Fentanyl PRN for analgesia  FAMILY  - Updates: no family at bedside on my exam.  - Inter-disciplinary family meet or Palliative Care meeting due by: 11/13   KRalene Ok MD PGY-2, [facility] 06/08/2017 7:34 AM

## 2017-06-08 NOTE — Progress Notes (Signed)
Nutrition Follow-up  DOCUMENTATION CODES:   Morbid obesity  INTERVENTION:   Start TF via Cortrak tube:  Jevity 1.2 at 55 ml/h (1320 ml per day)  Pro-stat 30 ml TID  Provides 1884 kcal, 118 gm protein, 1069 ml free water daily  NUTRITION DIAGNOSIS:   Inadequate oral intake related to inability to eat as evidenced by NPO status.  Ongoing  GOAL:   Patient will meet greater than or equal to 90% of their needs  Unmet  MONITOR:   TF tolerance, Labs, I & O's  REASON FOR ASSESSMENT:   Consult Enteral/tube feeding initiation and management  ASSESSMENT:   76 yo female with PMH of HLD, HTN, BOOP, HF, obesity, vertigo, asthma, CAD, GERD, fish and shellfish allergy (per husband she has anaphylaxis) who was admitted on 10/31 with respiratory failure related to CHF.  Discussed patient in ICU rounds and with RN today. She was extubated 11/12. S/P swallow evaluation with SLP today. Remains NPO.  Cortrak tube has been placed. Tip is post-pyloric. Received MD Consult for TF initiation and management. Labs reviewed. Sodium 150 (H) CBG's: 95-131 Medications reviewed and include Solumedrol.  Diet Order:  Diet NPO time specified  EDUCATION NEEDS:   No education needs have been identified at this time  Skin:  Skin Assessment: Reviewed RN Assessment  Last BM:  11/12 (type 6)  Height:   Ht Readings from Last 1 Encounters:  05/31/17 5\' 4"  (1.626 m)    Weight:   Wt Readings from Last 1 Encounters:  06/08/17 243 lb 2.7 oz (110.3 kg)    Ideal Body Weight:  54.5 kg  BMI:  Body mass index is 41.74 kg/m.  Estimated Nutritional Needs:   Kcal:  1700-1900  Protein:  110-130 gm  Fluid:  1.7-1.9 L   Molli Barrows, RD, LDN, CNSC Pager [phone] After Hours Pager [phone]

## 2017-06-08 NOTE — Progress Notes (Signed)
SLP Cancellation Note  Patient Details Name: Kelli Velasquez MRN: [id number] DOB: 1941-07-05   Cancelled treatment:       Reason Eval/Treat Not Completed: Patient not medically ready. Pt on BiPAP. Pt will need more time post extubation for PO readiness and will need a FEES prior to significant PO attempts. Will not be medically ready today, but I will check in tomorrow. Agree with plan for NG tube, expect several days of recovery will be needed at best prior to diet orders. Discussed with pt, RN and family at bedside.    Talbot Monarch, Katherene Ponto 06/08/2017, 10:39 AM

## 2017-06-08 NOTE — Progress Notes (Signed)
Cortrak Tube Team Note:  Consult received to place a Cortrak feeding tube.   A 10 F Cortrak tube was placed in the RIGHT nare and secured with a nasal bridle at 75 cm. Per the Cortrak monitor reading the tube tip is post-pyloric.   X-ray is required, abdominal x-ray has been ordered by the Cortrak team. Please confirm tube placement before using the Cortrak tube.   If the tube becomes dislodged please keep the tube and contact the Cortrak team at [email or web address] ([email or web address]) for replacement.  If after hours and replacement cannot be delayed, place a NG tube and confirm placement with an abdominal x-ray.    Lucendia Leard MS, RD, LDN, CNSC [phone] Pager  [phone] Weekend/On-Call Pager   

## 2017-06-08 NOTE — Progress Notes (Signed)
Patient ID: Kelli Velasquez, female   DOB: 1941-03-02, 76 y.o.   MRN: [id number]      Advanced Heart Failure Rounding Note  Subjective:    Over 2 weeks prior to admission, she had increased dyspnea with exertion, she was short of breath at rest on the day of admission.  Her husband says she has been taking 20 mg of lasix daily and occasionally an extra 20 mg of lasix.   Presented to [facility] ED with respiratory distress. Family reported increased dyspnea over the last week requiring 2 liters oxygen at home. In ED she received solumedrol and duonebs. Placed on Bipap but continue decline requiring intubation. CXR concerning for interstitial edema and bilateral effusions.  Extubated 11/4. Noted to have swelling of upper airways on speech/swallow evaluation 11/6.  Developed respiratory distress 11/6 in the evening with hypoxia and hypercapnia and was re-intubated.    Extubated 06/07/17. Doing well so far. Still de-saturate to 80s when trying to lie flat (86 at the lowest).  CVP remains 6-7.  Not talking or swallowing yet. Further swallow eval today.   CXR 06/07/17 with left atelectasis and stable cardiomegaly with mild pulmonary edema. L>R pleural effusions.  Studies:  Echo 05/26/2017 EF 35-40%  Grade I DD RV normal Echo 2017 EF 45-50% with WMA Myoview 2017 No significant ischemia EF 48%.  [place] 2011- DES LAD and RCA.   Objective:   Weight Range: 243 lb 2.7 oz (110.3 kg) Body mass index is 41.74 kg/m.   Vital Signs:   Temp:  [97.8 F (36.6 C)-98.9 F (37.2 C)] 98.9 F (37.2 C) (11/13 0836) Pulse Rate:  [60-117] 93 (11/13 0700) Resp:  [20-38] 31 (11/13 0700) BP: (119-189)/(56-120) 167/83 (11/13 0700) SpO2:  [91 %-100 %] 96 % (11/13 0700) FiO2 (%):  [30 %] 30 % (11/12 0920) Weight:  [243 lb 2.7 oz (110.3 kg)] 243 lb 2.7 oz (110.3 kg) (11/13 0500) Last BM Date: 06/07/17  Weight change: Filed Weights   06/06/17 0500 06/07/17 0404 06/08/17 0500  Weight: 241 lb 10 oz (109.6 kg) 241 lb 6.5 oz  (109.5 kg) 243 lb 2.7 oz (110.3 kg)   Intake/Output:   Intake/Output Summary (Last 24 hours) at 06/08/2017 0842 Last data filed at 06/08/2017 0800 Gross per 24 hour  Intake 1248.45 ml  Output 2225 ml  Net -976.55 ml    Physical Exam  CVP 6-7 on my personal check.   General: Chronically ill and fatigued.  HEENT: Normal Neck: Supple. JVP 6-7 cm. Carotids 2+ bilat; no bruits. No thyromegaly or nodule noted. Cor: PMI nondisplaced. RRR, No M/G/R noted Lungs: Diminished basilar sounds.  Abdomen: Soft, non-tender, non-distended, no HSM. No bruits or masses. +BS  Extremities: No cyanosis, clubbing, or rash. RUR PICC.  Neuro: Alert & orientedx3, cranial nerves grossly intact. moves all 4 extremities w/o difficulty.    Affect flat.   Telemetry   NSR 70-90s, Personally reviewed.   Labs    CBC Recent Labs    06/07/17 0514 06/08/17 0423  WBC 18.7* 32.4*  NEUTROABS 17.4* 29.8*  HGB 10.8* 13.0  HCT 37.1 44.8  MCV 102.8* 102.3*  PLT 95* 828*   Basic Metabolic Panel Recent Labs    06/07/17 0514 06/08/17 0423  NA 150* 150*  K 3.2* 3.8  CL 112* 112*  CO2 31 31  GLUCOSE 271* 94  BUN 44* 33*  CREATININE 0.78 0.71  CALCIUM 8.6* 9.3  MG 2.1 2.4  PHOS 2.6 2.9   Liver Function Tests Recent  Labs    06/07/17 0514 06/08/17 0423  ALBUMIN 2.2* 2.7*   No results for input(s): LIPASE, AMYLASE in the last 72 hours. Cardiac Enzymes No results for input(s): CKTOTAL, CKMB, CKMBINDEX, TROPONINI in the last 72 hours. BNP: BNP (last 3 results) Recent Labs    05/26/17 0610  BNP 760.9*   ProBNP (last 3 results) Recent Labs    01/11/17 1053  PROBNP 264.0*   D-Dimer No results for input(s): DDIMER in the last 72 hours. Hemoglobin A1C No results for input(s): HGBA1C in the last 72 hours. Fasting Lipid Panel No results for input(s): CHOL, HDL, LDLCALC, TRIG, CHOLHDL, LDLDIRECT in the last 72 hours. Thyroid Function Tests No results for input(s): TSH, T4TOTAL, T3FREE,  THYROIDAB in the last 72 hours.  Invalid input(s): FREET3  Other results:  Imaging   No results found.  Medications:     Scheduled Medications: . aspirin  81 mg Oral Daily  . chlorhexidine gluconate (MEDLINE KIT)  15 mL Mouth Rinse BID  . Chlorhexidine Gluconate Cloth  6 each Topical Q0600  . ezetimibe  10 mg Oral QHS  . hydrALAZINE  12.5 mg Oral TID  . insulin aspart  0-20 Units Subcutaneous Q4H  . insulin glargine  20 Units Subcutaneous BID  . ipratropium-albuterol  3 mL Nebulization Q6H  . isosorbide dinitrate  10 mg Oral TID  . mouth rinse  15 mL Mouth Rinse QID  . methylPREDNISolone (SOLU-MEDROL) injection  40 mg Intravenous Q12H  . montelukast  10 mg Oral QHS  . pantoprazole sodium  40 mg Oral Daily  . potassium chloride  40 mEq Per Tube Once  . sodium chloride flush  10-40 mL Intracatheter Q12H    Infusions: . sodium chloride Stopped (06/05/17 0937)  . cefTRIAXone (ROCEPHIN)  IV Stopped (06/07/17 2252)  . dexmedetomidine (PRECEDEX) IV infusion Stopped (06/07/17 1515)  . 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  Admitted with acute/chronic respiratory failure  Assessment/Plan   1. Acute/chronic systolic CHF: Probably ischemic cardiomyopathy given prior history.  Echo this admission with EF down to 35-40% range, around 50% in past.  She was admitted with worsening dyspnea, elevated BNP, CXR with pulmonary edema. PCT not elevated.  Has history of BOOP.  - Volume status stable today.  - CVP 6-7. Lasix on hold. Not taking po yet.  - If remains stable will consider R/LHC prior to d/c.  - Off spiro since 11/7.  - Hold off on losartan for now.  - Use hydralazine/nitrates if BP control is needed.  - She  has not tolerated beta blockers in the past due to wheezing, would consider starting low dose of beta-1 specific bisoprolol when respiratory status improved. Would not start yet. - With fall in EF, would plan left/right heart cath once she is extubated and stable.  2. Acute hypoxemic respiratory failure: Initially thought multifactorial due to pulmonary edema, effusions, BOOP and OHS.  She was on IV Solumedrol, now prednisone given BOOP with ?component of exacerbation.  She was diuresed.  At baseline, she is on 2L home oxygen.  Extubated 11/4 but developed respiratory distress 11/6 and re-intubated.  She had had swelling of the upper airways noted by speech/swallow exam.  Found to  have MSSA PNA.  She is now on ceftriaxone.  - Antibiotics/steroids per CCM.  No change.   3. CAD: h/o DES to LAD and RCA in 2011.  No chest pain but admitted with severe dyspnea/volume overload and fall in EF on echo.  Troponin mildly elevated but no trend. Suspect most likely demand ischemia from volume overload.  - Given fall in EF, would plan cath when creatinine stabilizes [facility]).  Had planned cath 06/02/17 but re-intubated. Will plan for later this week if remains stable.  4.  AKI:  - Resolved.   5. HTN:  - remains elevated. - Will increase hydralazine and schedule IV doses while not taking po.  6. Hypokalemia - K 3.8 today. Supplement potassium.  7. Code status - Pt is DNR/DNI at this time.  8. NSVT - Mg 2.4 and K 3.8. Continue to follow.   Length of Stay: [place], [place]  06/08/2017, 8:42 AM  Advanced Heart Failure Team Pager [phone] (M-F; 7a - 4p)  Please contact [facility] for night-coverage after hours (4p -7a ) and weekends on [email or web address]  Patient seen with PA, agree with the above note.  - Start hydralazine 25 tid + isordil.  - Start spironolactone 12.5 daily.  - CVP 6-7, can hold off on Lasix for now.  - Creatinine improving.  Will plan left/right heart cath later this week,  ?Wednesday versus Thursday.   Loralie Champagne 06/08/2017 2:00 PM

## 2017-06-09 ENCOUNTER — Inpatient Hospital Stay (HOSPITAL_COMMUNITY): Payer: Medicare Other

## 2017-06-09 LAB — CBC
HEMATOCRIT: 43.5 % (ref 36.0–46.0)
Hemoglobin: 12.8 g/dL (ref 12.0–15.0)
MCH: 29.8 pg (ref 26.0–34.0)
MCHC: 29.4 g/dL — ABNORMAL LOW (ref 30.0–36.0)
MCV: 101.2 fL — ABNORMAL HIGH (ref 78.0–100.0)
PLATELETS: 116 10*3/uL — AB (ref 150–400)
RBC: 4.3 MIL/uL (ref 3.87–5.11)
RDW: 16.6 % — ABNORMAL HIGH (ref 11.5–15.5)
WBC: 22.9 10*3/uL — ABNORMAL HIGH (ref 4.0–10.5)

## 2017-06-09 LAB — BASIC METABOLIC PANEL
Anion gap: 7 (ref 5–15)
BUN: 42 mg/dL — ABNORMAL HIGH (ref 6–20)
CO2: 32 mmol/L (ref 22–32)
CREATININE: 0.97 mg/dL (ref 0.44–1.00)
Calcium: 9.4 mg/dL (ref 8.9–10.3)
Chloride: 109 mmol/L (ref 101–111)
GFR calc Af Amer: >60 mL/min (ref >60)
GFR, EST NON AFRICAN AMERICAN: 55 mL/min — AB (ref >60)
GLUCOSE: 151 mg/dL — AB (ref 65–99)
POTASSIUM: 3.5 mmol/L (ref 3.5–5.1)
Sodium: 148 mmol/L — ABNORMAL HIGH (ref 135–145)

## 2017-06-09 LAB — GLUCOSE, CAPILLARY
GLUCOSE-CAPILLARY: 159 mg/dL — AB (ref 65–99)
GLUCOSE-CAPILLARY: 145 mg/dL — AB (ref 65–99)
GLUCOSE-CAPILLARY: 243 mg/dL — AB (ref 65–99)
GLUCOSE-CAPILLARY: 192 mg/dL — AB (ref 65–99)
Glucose-Capillary: 262 mg/dL — ABNORMAL HIGH (ref 65–99)

## 2017-06-09 LAB — SEROTONIN RELEASE ASSAY (SRA)
SRA 100IU/mL UFH Ser-aCnc: 1 % (ref 0–20)
SRA, LOW DOSE HEPARIN: 1 % (ref 0–20)

## 2017-06-09 MED ORDER — METOPROLOL TARTRATE 5 MG/5ML IV SOLN
2.5000 mg | INTRAVENOUS | Status: DC | PRN
  Administered 2017-06-09 – 2017-06-13 (×6): 2.5 mg via INTRAVENOUS
  Filled 2017-06-09 (×7): qty 5

## 2017-06-09 MED ORDER — POTASSIUM CHLORIDE 20 MEQ/15ML (10%) PO SOLN
40.0000 meq | Freq: Once | ORAL | Status: DC
Start: 2017-06-09 — End: 2017-06-09

## 2017-06-09 MED ORDER — FUROSEMIDE 10 MG/ML IJ SOLN
40.0000 mg | Freq: Once | INTRAMUSCULAR | Status: AC
  Administered 2017-06-09: 40 mg via INTRAVENOUS
  Filled 2017-06-09: qty 4

## 2017-06-09 MED ORDER — HYDRALAZINE HCL 25 MG PO TABS
37.5000 mg | ORAL_TABLET | Freq: Three times a day (TID) | ORAL | Status: DC
  Filled 2017-06-09 (×4): qty 1.5

## 2017-06-09 MED ORDER — PREDNISONE 5 MG/5ML PO SOLN
20.0000 mg | Freq: Every day | ORAL | Status: DC
  Administered 2017-06-09: 20 mg
  Filled 2017-06-09 (×2): qty 20

## 2017-06-09 MED ORDER — METOPROLOL TARTRATE 5 MG/5ML IV SOLN
5.0000 mg | Freq: Once | INTRAVENOUS | Status: AC
  Administered 2017-06-09: 5 mg via INTRAVENOUS

## 2017-06-09 MED ORDER — ISOSORBIDE DINITRATE 10 MG PO TABS
20.0000 mg | ORAL_TABLET | Freq: Three times a day (TID) | ORAL | Status: DC
  Administered 2017-06-09 – 2017-06-20 (×33): 20 mg
  Filled 2017-06-09: qty 1
  Filled 2017-06-09 (×2): qty 2
  Filled 2017-06-09 (×2): qty 1
  Filled 2017-06-09: qty 2
  Filled 2017-06-09 (×4): qty 1
  Filled 2017-06-09: qty 2
  Filled 2017-06-09 (×2): qty 1
  Filled 2017-06-09: qty 2
  Filled 2017-06-09 (×3): qty 1
  Filled 2017-06-09 (×2): qty 2
  Filled 2017-06-09 (×7): qty 1
  Filled 2017-06-09: qty 2
  Filled 2017-06-09 (×2): qty 1
  Filled 2017-06-09 (×2): qty 2
  Filled 2017-06-09: qty 1
  Filled 2017-06-09: qty 2
  Filled 2017-06-09 (×2): qty 1
  Filled 2017-06-09: qty 2
  Filled 2017-06-09: qty 1

## 2017-06-09 MED ORDER — HYDRALAZINE HCL 20 MG/ML IJ SOLN
10.0000 mg | Freq: Three times a day (TID) | INTRAMUSCULAR | Status: DC
  Administered 2017-06-09 – 2017-06-10 (×3): 10 mg via INTRAVENOUS
  Filled 2017-06-09 (×3): qty 0.5
  Filled 2017-06-09: qty 1

## 2017-06-09 MED ORDER — POTASSIUM CHLORIDE 20 MEQ/15ML (10%) PO SOLN
40.0000 meq | Freq: Once | ORAL | Status: AC
  Administered 2017-06-09: 40 meq
  Filled 2017-06-09: qty 30

## 2017-06-09 MED ORDER — PANTOPRAZOLE SODIUM 40 MG PO PACK
40.0000 mg | PACK | Freq: Every day | ORAL | Status: DC
  Administered 2017-06-09 – 2017-06-22 (×14): 40 mg
  Filled 2017-06-09 (×14): qty 20

## 2017-06-09 NOTE — Progress Notes (Signed)
eLink Physician-Brief Progress Note Patient Name: Kelli Velasquez DOB: [birth year] MRN: [id number]   Date of Service  06/09/2017  HPI/Events of Note  Sinus tachycardia with PVCs and PACs. HR = 120-140.  eICU Interventions  Will order: 1. Metoprolol 2.5 mg IV Q 3 hours PRN HR > 115. Hold for SBP < 105.     Intervention Category Major Interventions: Arrhythmia - evaluation and management  Eston Heslin Eugene 06/09/2017, 2:41 AM

## 2017-06-09 NOTE — Progress Notes (Signed)
Physical Therapy Treatment/Re-eval Patient Details Name: Kelli Velasquez MRN: [id number] DOB: Oct 25, 1940 Today's Date: 06/09/2017    History of Present Illness 76 yo female admitted for progressive respiratory distress.  She then developed altered mental status.  Pt has a past medical history including Allergy, Asthma, Chronic bronchitis, CAD, Dyspnea, GERD, Hyperlipidemia, Hypertension, Myocardial infarction, Myopia, Neoplasm of skin, Nocturnal oxygen desaturation, Obesity, Osteoarthritis, Oxygen deficiency,  Urinary incontinence, and Vertigo. Required intubation 10/31-11/4 and  11/6-11/14, remains on Bipap.    PT Comments    Pt admitted with above diagnosis. Pt currently with functional limitations due to balance and endurance deficits.  Pt was only able to exercise with PT.  Had to have lots of rest breaks with HR increasing each attempt. At end of re-eval, pts HR up to 197 bpm in afib therefore called nurse and she came in and addressed and HR to 109 bpm fairly quickly.   Pt responsive entire time.  Goals originally set not appropriate as pt has declined therefore revised today.  Also, pt will need post acute Rehab at SNF.  Pt will benefit from skilled PT to increase their independence and safety with mobility to allow discharge to the venue listed below.     Follow Up Recommendations  Supervision for mobility/OOB;Supervision/Assistance - 24 hour;SNF     Equipment Recommendations  Rolling walker with 5" wheels    Recommendations for Other Services       Precautions / Restrictions Precautions Precautions: Fall Precaution Comments: watch O2 saturations and HR Restrictions Weight Bearing Restrictions: No    Mobility  Bed Mobility Overal bed mobility: Needs Assistance             General bed mobility comments: Nurse asked for bed level as pt has been on Bipap and has incr PVC's   Transfers                 General transfer comment: unable  Ambulation/Gait                  Stairs            Wheelchair Mobility    Modified Rankin (Stroke Patients Only)       Balance                                            Cognition Arousal/Alertness: Awake/alert Behavior During Therapy: WFL for tasks assessed/performed Overall Cognitive Status: Difficult to assess                                        Exercises General Exercises - Upper Extremity Shoulder Flexion: AAROM;Both;10 reps;Supine Elbow Flexion: AAROM;Both;10 reps;Supine Elbow Extension: AAROM;Both;5 reps;Supine General Exercises - Lower Extremity Heel Slides: AAROM;Both;10 reps;Supine Hip ABduction/ADduction: AAROM;Both;10 reps;Supine    General Comments General comments (skin integrity, edema, etc.): Redness all 4 extremities, edema bil UEs      Pertinent Vitals/Pain Pain Assessment: Faces Faces Pain Scale: Hurts even more Pain Location: chest and abdomen Pain Descriptors / Indicators: Aching;Grimacing;Guarding;Sore Pain Intervention(s): Limited activity within patient's tolerance;Monitored during session;Repositioned   102 - 118 bpm during exercise, 197 bpm 100% O2 on Bipap 40% FiO2, 26-32 RR, 137/70 BP initially and 118/70 post BP.   Home Living  Prior Function            PT Goals (current goals can now be found in the care plan section) Acute Rehab PT Goals PT Goal Formulation: With patient Time For Goal Achievement: 06/28/17 Potential to Achieve Goals: Fair Progress towards PT goals: Not progressing toward goals - comment;Goals downgraded-see care plan(limited by poor respiratory rate and incr HR to 197 bpm)    Frequency    Min 3X/week      PT Plan Discharge plan needs to be updated    Co-evaluation              AM-PAC PT "6 Clicks" Daily Activity  Outcome Measure  Difficulty turning over in bed (including adjusting bedclothes, sheets and blankets)?: Unable Difficulty moving  from lying on back to sitting on the side of the bed? : Unable Difficulty sitting down on and standing up from a chair with arms (e.g., wheelchair, bedside commode, etc,.)?: Unable Help needed moving to and from a bed to chair (including a wheelchair)?: Total Help needed walking in hospital room?: Total Help needed climbing 3-5 steps with a railing? : Total 6 Click Score: 6    End of Session Equipment Utilized During Treatment: Oxygen;Gait belt Activity Tolerance: Patient limited by fatigue(limited by incr HR to 197 bpm end of treatment.nurse aware) Patient left: with call bell/phone within reach;in bed;with family/visitor present;with nursing/sitter in room;with SCD's reapplied Nurse Communication: Mobility status;Need for lift equipment PT Visit Diagnosis: Unsteadiness on feet (R26.81);Difficulty in walking, not elsewhere classified (R26.2)     Time: 1594-5859 PT Time Calculation (min) (ACUTE ONLY): 21 min  Charges:  $Therapeutic Exercise: 8-22 mins                    G Codes:       Ladeidra Borys,PT Acute Rehabilitation [phone] (pager)    Denice Paradise 06/09/2017, 11:52 AM

## 2017-06-09 NOTE — Progress Notes (Signed)
SLP Cancellation Note  Patient Details Name: Kelli Velasquez MRN: [id number] DOB: [birth year]   Cancelled treatment:        Pt on Bipap and not appropriate for swallow intervention.    Houston Siren 06/09/2017, 1:54 PM Orbie Pyo Colvin Caroli.Ed [employer] [phone]

## 2017-06-09 NOTE — Plan of Care (Signed)
  Acute Rehab PT Goals(only PT should resolve) Pt Will Go Supine/Side To Sit 48/62/8241 7530 - Not Applicable by Denice Paradise, PT 06/09/2017 1212 - Not Met (add Reason) by Denice Paradise, PT Note Pt had decline in status. Patient Will Transfer Sit To/From Stand 10/40/4591 3685 - Not Applicable by Denice Paradise, PT 06/09/2017 1212 - Not Met (add Reason) by Denice Paradise, PT Note Pt had decline in status. Pt Will Ambulate 99/23/4144 3601 - Not Applicable by Denice Paradise, PT 06/09/2017 1212 - Not Met (add Reason) by Denice Paradise, PT Note Pt had decline in status. Pt Will Go Supine/Side To Sit 06/09/2017 1214 by Irwin Brakeman F, PT Flowsheets Taken 06/09/2017 1214  Pt will go Supine/Side to Sit with moderate assist;with +2 Patient Will Perform Sitting Balance 06/09/2017 1214 by Denice Paradise, PT Flowsheets Taken 06/09/2017 1214  Patient will perform sitting balance with minimal assist;with +2;with bilateral UE support Patient Will Transfer Sit To/From Stand 06/09/2017 1214 by Denice Paradise, PT Flowsheets Taken 06/09/2017 1214  Patient will transfer sit to/from stand with moderate assist;with +2 Pt Will Transfer Bed To Chair/Chair To Bed 06/09/2017 1214 by Irwin Brakeman F, PT Flowsheets Taken 06/09/2017 1214  Pt will Transfer Bed to Chair/Chair to Bed with +2 total assist;with cues (comment type and amount) Note With pt =40% Pt/caregiver will Perform Home Exercise Program 06/09/2017 1214 by Denice Paradise, PT Flowsheets Taken 06/09/2017 1214  Pt/caregiver will Perform Home Exercise Program For increased strengthening;With minimal assist  [facility] Acute Rehabilitation [phone] (pager)

## 2017-06-09 NOTE — Progress Notes (Signed)
Patient ID: Kelli Velasquez, female   DOB: 1940/09/09, 76 y.o.   MRN: [id number]      Advanced Heart Failure Rounding Note  Subjective:    Over 2 weeks prior to admission, she had increased dyspnea with exertion, she was short of breath at rest on the day of admission.  Her husband says she has been taking 20 mg of lasix daily and occasionally an extra 20 mg of lasix.   Presented to [facility] ED with respiratory distress. Family reported increased dyspnea over the last week requiring 2 liters oxygen at home. In ED she received solumedrol and duonebs. Placed on Bipap but continue decline requiring intubation. CXR concerning for interstitial edema and bilateral effusions.  Extubated 11/4. Noted to have swelling of upper airways on speech/swallow evaluation 11/6.  Developed respiratory distress 11/6 in the evening with hypoxia and hypercapnia and was re-intubated.    Extubated 06/07/17. She remains on Bipap.  SBP 130s-180s, sinus rhythm rate around 100 bpm. CVP not hooked up.   CXR 06/09/17 with LLL PNA  Studies:  Echo 05/26/2017 EF 35-40%  Grade I DD RV normal Echo 2017 EF 45-50% with WMA Myoview 2017 No significant ischemia EF 48%.  [place] 2011- DES LAD and RCA.   Objective:   Weight Range: 243 lb 6.2 oz (110.4 kg) Body mass index is 41.78 kg/m.   Vital Signs:   Temp:  [98 F (36.7 C)-98.9 F (37.2 C)] 98.7 F (37.1 C) (11/14 0751) Pulse Rate:  [39-122] 104 (11/14 0900) Resp:  [21-36] 33 (11/14 0900) BP: (107-182)/(44-82) 152/82 (11/14 0900) SpO2:  [97 %-100 %] 99 % (11/14 0900) FiO2 (%):  [40 %] 40 % (11/14 0801) Weight:  [243 lb 6.2 oz (110.4 kg)] 243 lb 6.2 oz (110.4 kg) (11/14 0458) Last BM Date: 06/07/17  Weight change: Filed Weights   06/07/17 0404 06/08/17 0500 06/09/17 0458  Weight: 241 lb 6.5 oz (109.5 kg) 243 lb 2.7 oz (110.3 kg) 243 lb 6.2 oz (110.4 kg)   Intake/Output:   Intake/Output Summary (Last 24 hours) at 06/09/2017 0948 Last data filed at 06/09/2017 0900 Gross  per 24 hour  Intake 2618.33 ml  Output 2525 ml  Net 93.33 ml    Physical Exam   General: Chronically ill and fatigued.  HEENT: Normal Neck: Supple. JVP 6-7 cm. Carotids 2+ bilat; no bruits. No thyromegaly or nodule noted. Cor: PMI nondisplaced. RRR, No M/G/R noted Lungs: Diminished basilar sounds.  Abdomen: Soft, non-tender, non-distended, no HSM. No bruits or masses. +BS  Extremities: No cyanosis, clubbing, or rash. RUR PICC.  Neuro: Alert & orientedx3, cranial nerves grossly intact. moves all 4 extremities w/o difficulty.    Affect flat.   Telemetry   NSR 70-90s, Personally reviewed.   Labs    CBC Recent Labs    06/07/17 0514 06/08/17 0423 06/09/17 0507  WBC 18.7* 32.4* 22.9*  NEUTROABS 17.4* 29.8*  --   HGB 10.8* 13.0 12.8  HCT 37.1 44.8 43.5  MCV 102.8* 102.3* 101.2*  PLT 95* 140* 903*   Basic Metabolic Panel Recent Labs    06/07/17 0514 06/08/17 0423 06/09/17 0507  NA 150* 150* 148*  K 3.2* 3.8 3.5  CL 112* 112* 109  CO2 31 31 32  GLUCOSE 271* 94 151*  BUN 44* 33* 42*  CREATININE 0.78 0.71 0.97  CALCIUM 8.6* 9.3 9.4  MG 2.1 2.4  --   PHOS 2.6 2.9  --    Liver Function Tests Recent Labs    06/07/17  9924 06/08/17 0423  ALBUMIN 2.2* 2.7*   No results for input(s): LIPASE, AMYLASE in the last 72 hours. Cardiac Enzymes No results for input(s): CKTOTAL, CKMB, CKMBINDEX, TROPONINI in the last 72 hours. BNP: BNP (last 3 results) Recent Labs    05/26/17 0610  BNP 760.9*   ProBNP (last 3 results) Recent Labs    01/11/17 1053  PROBNP 264.0*   D-Dimer No results for input(s): DDIMER in the last 72 hours. Hemoglobin A1C No results for input(s): HGBA1C in the last 72 hours. Fasting Lipid Panel No results for input(s): CHOL, HDL, LDLCALC, TRIG, CHOLHDL, LDLDIRECT in the last 72 hours. Thyroid Function Tests No results for input(s): TSH, T4TOTAL, T3FREE, THYROIDAB in the last 72 hours.  Invalid input(s): FREET3  Other results:  Imaging     Dg Chest Port 1 View  Result Date: 06/09/2017 CLINICAL DATA:  Status postextubation.  Airspace consolidation EXAM: PORTABLE CHEST 1 VIEW COMPARISON:  June 07, 2017. FINDINGS: Endotracheal tube has been removed. There is a feeding tube with tip in the region of the first portion of the duodenum. There is a central catheter with tip at the cavoatrial junction. No pneumothorax. There are small pleural effusions bilaterally. There is airspace consolidation in the left lower lobe, similar to most recent study. There is atelectasis in the right base. There is cardiomegaly with pulmonary vascularity within normal limits. There is aortic atherosclerosis. No adenopathy. No bone lesions. IMPRESSION: Tube and catheter positions as described without pneumothorax. Airspace consolidation left lower lobe with small bilateral pleural effusions. Right base atelectasis present. Stable cardiomegaly. There is aortic atherosclerosis. Aortic Atherosclerosis (ICD10-I70.0). Electronically Signed   By: Lowella Grip III M.D.   On: 06/09/2017 07:03   Dg Abd Portable 1v  Result Date: 06/08/2017 CLINICAL DATA:  Feeding tube placement. EXAM: PORTABLE ABDOMEN - 1 VIEW COMPARISON:  06/02/2017 . FINDINGS: Feeding tube noted with tip projected over the duodenum. Surgical clips right upper quadrant. No bowel distention or free air. Degenerative changes thoracolumbar spine. Basilar atelectasis . IMPRESSION: Feeding tube noted with tip projected over the duodenum. No bowel distention. Electronically Signed   By: Marcello Moores  Register   On: 06/08/2017 12:58    Medications:     Scheduled Medications: . aspirin  81 mg Oral Daily  . chlorhexidine gluconate (MEDLINE KIT)  15 mL Mouth Rinse BID  . Chlorhexidine Gluconate Cloth  6 each Topical Q0600  . ezetimibe  10 mg Per Tube QHS  . feeding supplement (PRO-STAT SUGAR FREE 64)  30 mL Per Tube TID  . hydrALAZINE  37.5 mg Oral Q8H   Or  . hydrALAZINE  10 mg Intravenous Q8H  .  insulin aspart  0-20 Units Subcutaneous Q4H  . insulin glargine  20 Units Subcutaneous Daily  . ipratropium-albuterol  3 mL Nebulization Q6H  . isosorbide dinitrate  20 mg Per Tube TID  . mouth rinse  15 mL Mouth Rinse QID  . methylPREDNISolone (SOLU-MEDROL) injection  40 mg Intravenous Q12H  . montelukast  10 mg Per Tube QHS  . pantoprazole (PROTONIX) IV  40 mg Intravenous Q24H  . sodium chloride flush  10-40 mL Intracatheter Q12H  . spironolactone  12.5 mg Oral Daily    Infusions: . sodium chloride Stopped (06/05/17 0937)  . cefTRIAXone (ROCEPHIN)  IV Stopped (06/08/17 2226)  . dextrose 5 % and 0.45% NaCl 50 mL/hr at 06/09/17 0635  . 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  Admitted with acute/chronic respiratory failure  Assessment/Plan   1. Acute/chronic systolic CHF: Probably ischemic cardiomyopathy given prior history.  Echo this admission with EF down to 35-40% range, around 50% in past.  She was admitted with worsening dyspnea, elevated BNP, CXR with pulmonary edema. PCT not elevated.  Has history of BOOP.  MSSA PNA (LLL infiltrate).  Exam difficult for volume, no CVP set up today.  Do not think she is markedly volume overloaded.  - Would set up CVP, check at least twice daily.  - Will give 1 dose IV Lasix today to keep I/O even.  - With fall in EF, would plan R/LHC prior to discharge once she stabilizes off Bipap, etc.  - Increase hydralazine/isordil for BP control.  - Continue spironolactone 12.5 daily.   - She has not tolerated beta blockers in the past due to wheezing, would consider starting low dose of beta-1 specific bisoprolol when respiratory status improved. Would not start  yet. - With fall in EF, would plan left/right heart cath once she is extubated and stable.  2. Acute hypoxemic respiratory failure: Initially thought multifactorial due to pulmonary edema, effusions, BOOP and OHS.  She is on IV Solumedrol given BOOP with ?component of exacerbation.  She was diuresed.  At baseline, she is on 2L home oxygen.  Extubated 11/4 but developed respiratory distress 11/6 and re-intubated.  She had had swelling of the upper airways noted by speech/swallow exam.  Found to have MSSA PNA.  She is now on ceftriaxone.  - Antibiotics/steroids per CCM.  No change.   3. CAD: h/o DES to LAD and RCA in 2011.  No chest pain but admitted with severe dyspnea/volume overload and fall in EF on echo.  Troponin mildly elevated but no trend. Suspect most likely demand ischemia from volume overload.  - Given fall in EF, would plan cath when creatinine/respiratory status stabilizes [facility]) => once off Bipap and respiratory status better.   4.  AKI: Resolved.   5. HTN: remains elevated. - Will increase hydralazine today.   6. Code status: Pt is DNR/DNI at this time.  7. NSVT: No recurrence.  Length of Stay: Bertrand, MD  06/09/2017, 9:48 AM  Advanced Heart Failure Team Pager [phone] (M-F; 7a - 4p)  Please contact [facility] for night-coverage after hours (4p -7a ) and weekends on [email or web address]

## 2017-06-09 NOTE — Progress Notes (Signed)
[facility] Progress Note Patient Name: Kelli Velasquez DOB: 09/21/1940 MRN: [id number]   Date of Service  06/09/2017  HPI/Events of Note  K+ = 3.5 and Creatinine = 0.97.  eICU Interventions  Will replace K+.     Intervention Category Major Interventions: Electrolyte abnormality - evaluation and management  Emaya Preston Eugene 06/09/2017, 6:29 AM

## 2017-06-09 NOTE — Plan of Care (Signed)
  Acute Rehab PT Goals(only PT should resolve) Pt Will Go Supine/Side To Sit 06/09/2017 1212 - Not Met (add Reason) by Denice Paradise, PT Note Pt had decline in status. Patient Will Transfer Sit To/From Stand 06/09/2017 1212 - Not Met (add Reason) by Denice Paradise, PT Note Pt had decline in status. Pt Will Ambulate 06/09/2017 1212 - Not Met (add Reason) by Denice Paradise, PT Note Pt had decline in status.  [place] [phone] (pager)

## 2017-06-09 NOTE — Progress Notes (Signed)
OT Cancellation Note  Patient Details Name: Kelli Velasquez MRN: [id number] DOB: 25-Dec-1940   Cancelled Treatment:    Reason Eval/Treat Not Completed: Patient at procedure or test/ unavailable. Pt working with PT. PT also shared that during session HR rose up to over 200. OT will continue to follow for OT eval, and attempt to see later as schedule allows.  [name] 06/09/2017, 11:29 AM  Hulda Humphrey OTR/L [phone]

## 2017-06-09 NOTE — Progress Notes (Signed)
PULMONARY / CRITICAL CARE MEDICINE   Name: Kelli Velasquez MRN: [id number] DOB: January 29, 1941    ADMISSION DATE:  05/26/2017 CONSULTATION DATE:  Admitted 10/31 > extubated 11/4 and to SDU 11/5. 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  She had significant respiratory distress in ER and failed trial bipap requiring intubation.  Initial w/u revealed BNP >700, WBC 21.4 (chronic steroids), hypercarbic respiratory failure with pH 7.211, PCO2 99.4. She required intubation and responded well to diuresis. She was extubated 11/4 and transferred out to SDU the following day. Further diuresis limited by arrhythmia and electrolyte abnormality. Failed swallow eval.    PAST MEDICAL HISTORY :  She  has a past medical history of Allergy, Asthma, Chronic bronchitis ([place]), Coronary artery disease, Dyspnea, Full dentures, Gallstones, GERD (gastroesophageal reflux disease), History of echocardiogram, History of nuclear stress test, Hyperlipidemia, Hypertension, Myocardial infarction ([place]), Myopia, Neoplasm of skin, Nocturnal oxygen desaturation, Obesity, Osteoarthritis, Overactive bladder, Oxygen deficiency, Rash, Retaining fluid, Urinary incontinence, Vertigo, and Wears glasses.  PAST SURGICAL HISTORY: She  has a past surgical history that includes Total knee arthroplasty (2010); vocal cord polypectomy; Cholecystectomy (92); Joint replacement (2007); Dilation and curettage of uterus (1984); Colonoscopy; Coronary angioplasty with stent (07/2009); CATARACT EXTRACTION PHACO AND INTRAOCULAR LENS PLACEMENT (IOC)  Left  Complicated (Left, 7/98/9211); CATARACT EXTRACTION PHACO AND INTRAOCULAR LENS PLACEMENT (IOC)  right (Right, 12/16/2016); VIDEO BRONCHOSCOPY WITH FLUORO (Bilateral, 07/05/2013);  HERNIA REPAIR INFRAUMILICAL INCISIONAL (N/A, 05/16/2013); and INSERTION OF MESH (N/A, 05/16/2013).  Allergies  Allergen Reactions  . Fish Allergy Anaphylaxis  . Shellfish Allergy Anaphylaxis  . Aspirin     REACTION: nausea and vomiting High doses  . Atorvastatin     REACTION: leg pain  . Crestor [Rosuvastatin Calcium] Other (See Comments)    Muscle pain - allergy/intolerance  . Penicillins     Due to mold allergy per pt  . Simvastatin     REACTION: muscle pain  . Trandolapril     REACTION: leg pain    No current facility-administered medications on file prior to encounter.    Current Outpatient Medications on File Prior to Encounter  Medication Sig  . acetaminophen (TYLENOL) 325 MG tablet Take 650 mg by mouth every 6 (six) hours as needed for mild pain. Take as needed per bottle   . acetaminophen-codeine (TYLENOL #3) 300-30 MG tablet One every 4 hours as needed for cough  . amLODipine (NORVASC) 10 MG tablet Take 1 tablet (10 mg total) by mouth daily.  Marland Kitchen aspirin 81 MG tablet Take 81 mg by mouth daily.  . chlorpheniramine (CHLOR-TRIMETON) 4 MG tablet Take 4 mg by mouth every morning.   . cromolyn (NASALCROM) 5.2 MG/ACT nasal spray Place 2 sprays into both nostrils daily as needed for allergies.  . cyanocobalamin (,VITAMIN B-12,) 1000 MCG/ML injection Inject 1,000 mcg into the muscle every 3 (three) months.  . Cyanocobalamin (VITAMIN B12) 3000 MCG SUBL Place 1 capsule under the tongue daily.  Marland Kitchen dextromethorphan-guaiFENesin (MUCINEX DM) 30-600 MG 12hr tablet Take 1 tablet by mouth 2 (two) times daily as needed for cough.  . EPIPEN 2-PAK 0.3 MG/0.3ML SOAJ injection INJECT 0.3 MLS (0.3 MG TOTAL) INTO THE MUSCLE ONCE AS NEEDED FOR ALLERGIC REACTION  . esomeprazole (NEXIUM) 40 MG capsule Take 1 capsule (  40 mg total) by mouth daily.  Marland Kitchen ezetimibe (ZETIA) 10 MG tablet TAKE 1 TABLET (10 MG TOTAL) BY MOUTH DAILY.  . furosemide (LASIX) 20 MG tablet TAKE 1 TABLET BY MOUTH EVERY DAY  .  furosemide (LASIX) 40 MG tablet Take 1 tablet (40 mg total) by mouth daily. Take 1 daily as needed for extra swelling (Patient taking differently: Take 40 mg by mouth daily as needed for fluid. Take 1 daily as needed for extra swelling )  . montelukast (SINGULAIR) 10 MG tablet Take 1 tablet (10 mg total) by mouth at bedtime.  . Multiple Vitamin (MULTIVITAMIN) capsule Take 1 capsule by mouth daily.  . OXYGEN Inhale 3 L into the lungs continuous. 24/7 2 lpm  [employer]  . potassium chloride SA (KLOR-CON M20) 20 MEQ tablet Take 1 tablet (20 mEq total) by mouth daily as needed. (Patient taking differently: Take 20 mEq by mouth daily. )  . PREDNISONE PO Take 15-20 mg by mouth daily. Take 20 mg one day and 15 mg the next days  . PROAIR HFA 108 (90 BASE) MCG/ACT inhaler INHALE 2 PUFFS BY MOUTH EVERY 4 HOURS AS NEEDED FOR WHEEZING OR FOR SHORTNESS OF BREATH  . ranitidine (ZANTAC) 75 MG tablet Take 300 mg by mouth at bedtime.     FAMILY HISTORY:  Her indicated that her mother is deceased. She indicated that her father is deceased. She indicated that the status of her maternal grandmother is unknown. She indicated that the status of her neg hx is unknown.  SOCIAL HISTORY: She  reports that she quit smoking about 34 years ago. Her smoking use included cigarettes. She has a 25.00 pack-year smoking history. she has never used smokeless tobacco. She reports that she drinks alcohol. She reports that she does not use drugs.  REVIEW OF SYSTEMS:   Denies CP, SOB, abd pain, extremity pain.   SUBJECTIVE:  Patient AOx3, hoarse. Denies SOB. Asking for food.   VITAL SIGNS: BP (!) 168/48   Pulse (!) 112   Temp 98.9 F (37.2 C) (Oral)   Resp (!) 32   Ht _0  (1.626 m)   Wt 243 lb 6.2 oz (110.4 kg)   LMP 07/27/1980   SpO2 97%   BMI 41.78 kg/m   HEMODYNAMICS:    VENTILATOR SETTINGS: FiO2 (%):  [40 %] 40 %  INTAKE / OUTPUT: I/O last 3 completed shifts: In: 3310.8 [I.V.:2040.8; NG/GT:1170; IV  Piggyback:100] Out: 4000 [Urine:4000]  PHYSICAL EXAMINATION: General:  Obese female lying in bed in NAD on [place].  Neuro: AOx3, CN II-XII grossly intact.  HEENT:  Normocephalic atraumatic, unable to appreciate JVD 2/2 body habitus Cardiovascular:  Irregular rhythm, normal rate, no MRG, 1+ LLE.  Lungs:  Good air movement, coarse breath sounds throughout.  Abdomen:  SNTND, +BS Musculoskeletal:  WWP, minimal BLE.  Skin:  Intact, warm, well perfused.   LABS:  BMET Recent Labs  Lab 06/07/17 0514 06/08/17 0423 06/09/17 0507  NA 150* 150* 148*  K 3.2* 3.8 3.5  CL 112* 112* 109  CO2 31 31 32  BUN 44* 33* 42*  CREATININE 0.78 0.71 0.97  GLUCOSE 271* 94 151*    Electrolytes Recent Labs  Lab 06/06/17 0504 06/06/17 0505 06/07/17 0514 06/08/17 0423 06/09/17 0507  CALCIUM  --  8.5* 8.6* 9.3 9.4  MG 2.0  --  2.1 2.4  --   PHOS  --  3.3 2.6 2.9  --     CBC Recent Labs  Lab 06/07/17 0514  06/08/17 0423 06/09/17 0507  WBC 18.7* 32.4* 22.9*  HGB 10.8* 13.0 12.8  HCT 37.1 44.8 43.5  PLT 95* 140* 116*    Coag's No results for input(s): APTT, INR in the last 168 hours.  Sepsis Markers Recent Labs  Lab 06/02/17 0911 06/02/17 1230 06/02/17 1830 06/02/17 2146 06/03/17 0500 06/04/17 0540  LATICACIDVEN  --  1.9 2.3* 2.4*  --   --   PROCALCITON <0.10  --   --   --  <0.10 <0.10    ABG No results for input(s): PHART, PCO2ART, PO2ART in the last 168 hours.  Liver Enzymes Recent Labs  Lab 06/06/17 0505 06/07/17 0514 06/08/17 0423  ALBUMIN 2.3* 2.2* 2.7*    Cardiac Enzymes Recent Labs  Lab 06/02/17 0911 06/02/17 1513  TROPONINI 0.05* 0.05*    Glucose Recent Labs  Lab 06/08/17 0835 06/08/17 1140 06/08/17 1543 06/08/17 1928 06/08/17 2351 06/09/17 0422  GLUCAP 95 131* 145* 204* 310* 159*    Imaging Dg Chest Port 1 View  Result Date: 06/09/2017 CLINICAL DATA:  Status postextubation.  Airspace consolidation EXAM: PORTABLE CHEST 1 VIEW COMPARISON:   June 07, 2017. FINDINGS: Endotracheal tube has been removed. There is a feeding tube with tip in the region of the first portion of the duodenum. There is a central catheter with tip at the cavoatrial junction. No pneumothorax. There are small pleural effusions bilaterally. There is airspace consolidation in the left lower lobe, similar to most recent study. There is atelectasis in the right base. There is cardiomegaly with pulmonary vascularity within normal limits. There is aortic atherosclerosis. No adenopathy. No bone lesions. IMPRESSION: Tube and catheter positions as described without pneumothorax. Airspace consolidation left lower lobe with small bilateral pleural effusions. Right base atelectasis present. Stable cardiomegaly. There is aortic atherosclerosis. Aortic Atherosclerosis (ICD10-I70.0). Electronically Signed   By: Lowella Grip III M.D.   On: 06/09/2017 07:03   Dg Abd Portable 1v  Result Date: 06/08/2017 CLINICAL DATA:  Feeding tube placement. EXAM: PORTABLE ABDOMEN - 1 VIEW COMPARISON:  06/02/2017 . FINDINGS: Feeding tube noted with tip projected over the duodenum. Surgical clips right upper quadrant. No bowel distention or free air. Degenerative changes thoracolumbar spine. Basilar atelectasis . IMPRESSION: Feeding tube noted with tip projected over the duodenum. No bowel distention. Electronically Signed   By: Marcello Moores  Register   On: 06/08/2017 12:58    STUDIES:  Echo 10/31>>> EF 35-40%, diffuse hypokinesis CT chest 11/7 read pending > RLL consolidation, no appreciable effusion    CULTURES: BCx2 11/7 > final no growth BAL 11/7 >30,000 colonies normal respiratory flora AFB smear negative Viral sputum culture negative at 4 days.   ANTIBIOTICS: Diflucan (Oral thrush) 11/3>>   Ceftazidine 11/7 > Vancomycin 11/7 > 11/8  SIGNIFICANT EVENTS: Admit 10/31 11/4 extubate 11/5 transfer SDU 11/7 transfer ICU reintubated. 11/12 extubated   LINES/TUBES: ETT 10/31>>>11/4,  reintubated 11/7, extubated 11/12 NOW DNR/DNI  DISCUSSION: 76yo female with hx BOOP, CHF, HTN, probable OHS presenting with acute on chronic respiratory failure likely r/t CHF. Improved initially with diuresis and was extuabted successfully, however, two days later 11/7 became hypoxemic/hypercarbic requiring reintubation. Extubated 11/12.   ASSESSMENT / PLAN:  PULMONARY A: Acute on chronic hypercarbic/hypoxemic respiratory failure Pulmonary edema Hx BOOP - on chronic steroids and 2L [place] at home MSSA PNA > ceftazidime, now ceftriaxone P:    -change solumedrol to home prednisone per tube -Continue duoneb, singular -Diuresis directed by advanced CHF service -BiPAP PRN for WOB  CARDIOVASCULAR A: Acute on  chronic systolic and diastolic CHF Net Negative>> 16 L on 11/7. Echo with EF 35-40% and diffuse hypokinesis Troponin elevation on initial admit, decreased 11/7 Hx HTN.  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 Worsened 11/13, likely 2/2 hemoconcentration.  New thrombocytopenia > HITT panel elevated P:  -daily CBC  INFECTIOUS A: MSSA PNA purulent secretions noted during intubation. Bilateral infiltrates. PCT negative.  Oral thrush Plan:  -continue ceftriaxone (day #8) for 10 day course -Follow BC and BAL -Diflucan started 11/3 for oral thrush -Follow fever curve and WBC  ENDOCRINE Hyperglycemia > used 47U SSI last 24 hrs P: -CBG Q 4   -resistant SSI 2/2 steroids -Lantus 20U QD while NPO -decrease D5 0.45% NaCL to 529mhr  NEUROLOGIC A: Acute metabolic encephalopathy, resolved. P: -Fentanyl PRN  for analgesia  Goals of care: discussed with Dr. SoHalford Chessmannd husband at bedside. Given respiratory status worsening overnight with break from BiPAP, will consider comfort care later today if patient continues to have increased WOB. If comfort care, could consider allowing PO feedings as patient is repeatedly asking for food. Would not consider cath at this time given current status.   FAMILY  - Updates: husband at bedside, updated as above.  - Inter-disciplinary family meet or Palliative Care meeting due by: 11/13   KaRalene OkMD PGY-2, [facility] 06/09/2017 7:48 AM

## 2017-06-10 ENCOUNTER — Inpatient Hospital Stay (HOSPITAL_COMMUNITY): Payer: Medicare Other

## 2017-06-10 LAB — BASIC METABOLIC PANEL
ANION GAP: 8 (ref 5–15)
BUN: 33 mg/dL — AB (ref 6–20)
CHLORIDE: 106 mmol/L (ref 101–111)
CO2: 34 mmol/L — ABNORMAL HIGH (ref 22–32)
Calcium: 9 mg/dL (ref 8.9–10.3)
Creatinine, Ser: 0.84 mg/dL (ref 0.44–1.00)
Glucose, Bld: 220 mg/dL — ABNORMAL HIGH (ref 65–99)
POTASSIUM: 3.4 mmol/L — AB (ref 3.5–5.1)
SODIUM: 148 mmol/L — AB (ref 135–145)

## 2017-06-10 LAB — CBC
HEMATOCRIT: 44.4 % (ref 36.0–46.0)
Hemoglobin: 12.9 g/dL (ref 12.0–15.0)
MCH: 29.3 pg (ref 26.0–34.0)
MCHC: 29.1 g/dL — ABNORMAL LOW (ref 30.0–36.0)
MCV: 100.9 fL — ABNORMAL HIGH (ref 78.0–100.0)
PLATELETS: 98 10*3/uL — AB (ref 150–400)
RBC: 4.4 MIL/uL (ref 3.87–5.11)
RDW: 16.2 % — AB (ref 11.5–15.5)
WBC: 21.5 10*3/uL — AB (ref 4.0–10.5)

## 2017-06-10 LAB — GLUCOSE, CAPILLARY
GLUCOSE-CAPILLARY: 135 mg/dL — AB (ref 65–99)
GLUCOSE-CAPILLARY: 142 mg/dL — AB (ref 65–99)
GLUCOSE-CAPILLARY: 170 mg/dL — AB (ref 65–99)
GLUCOSE-CAPILLARY: 175 mg/dL — AB (ref 65–99)
GLUCOSE-CAPILLARY: 191 mg/dL — AB (ref 65–99)
Glucose-Capillary: 118 mg/dL — ABNORMAL HIGH (ref 65–99)
Glucose-Capillary: 222 mg/dL — ABNORMAL HIGH (ref 65–99)

## 2017-06-10 LAB — LEGIONELLA CULTURE REFLEX

## 2017-06-10 LAB — VIRUS CULTURE

## 2017-06-10 LAB — LEGIONELLA SPECIES CULTURE

## 2017-06-10 MED ORDER — SPIRONOLACTONE 25 MG PO TABS
12.5000 mg | ORAL_TABLET | Freq: Every day | ORAL | Status: DC
  Administered 2017-06-11 – 2017-06-14 (×4): 12.5 mg
  Filled 2017-06-10 (×5): qty 1

## 2017-06-10 MED ORDER — FUROSEMIDE 10 MG/ML IJ SOLN
40.0000 mg | Freq: Two times a day (BID) | INTRAMUSCULAR | Status: DC
  Administered 2017-06-10 – 2017-06-11 (×2): 40 mg via INTRAVENOUS
  Filled 2017-06-10 (×3): qty 4

## 2017-06-10 MED ORDER — PREDNISONE 20 MG PO TABS
20.0000 mg | ORAL_TABLET | Freq: Every day | ORAL | Status: DC
  Administered 2017-06-10 – 2017-06-14 (×5): 20 mg
  Filled 2017-06-10 (×5): qty 1

## 2017-06-10 MED ORDER — HYDRALAZINE HCL 20 MG/ML IJ SOLN
10.0000 mg | Freq: Three times a day (TID) | INTRAMUSCULAR | Status: DC
  Administered 2017-06-10 – 2017-06-15 (×8): 10 mg via INTRAVENOUS
  Filled 2017-06-10: qty 0.5
  Filled 2017-06-10 (×2): qty 1
  Filled 2017-06-10 (×2): qty 0.5
  Filled 2017-06-10 (×6): qty 1

## 2017-06-10 MED ORDER — POTASSIUM CHLORIDE 20 MEQ/15ML (10%) PO SOLN
40.0000 meq | Freq: Once | ORAL | Status: AC
  Administered 2017-06-10: 40 meq
  Filled 2017-06-10: qty 30

## 2017-06-10 MED ORDER — HYDRALAZINE HCL 50 MG PO TABS
50.0000 mg | ORAL_TABLET | Freq: Three times a day (TID) | ORAL | Status: DC
  Administered 2017-06-11 – 2017-06-21 (×22): 50 mg
  Filled 2017-06-10 (×28): qty 1

## 2017-06-10 MED ORDER — POTASSIUM CHLORIDE CRYS ER 20 MEQ PO TBCR
40.0000 meq | EXTENDED_RELEASE_TABLET | Freq: Two times a day (BID) | ORAL | Status: DC
  Administered 2017-06-10: 40 meq via ORAL
  Filled 2017-06-10: qty 2

## 2017-06-10 MED ORDER — ASPIRIN 81 MG PO CHEW
81.0000 mg | CHEWABLE_TABLET | Freq: Every day | ORAL | Status: DC
  Administered 2017-06-11 – 2017-06-16 (×6): 81 mg
  Filled 2017-06-10 (×6): qty 1

## 2017-06-10 MED ORDER — HYDRALAZINE HCL 25 MG PO TABS
37.5000 mg | ORAL_TABLET | Freq: Three times a day (TID) | ORAL | Status: DC
  Filled 2017-06-10: qty 1.5

## 2017-06-10 MED ORDER — HYDRALAZINE HCL 20 MG/ML IJ SOLN: 10.0000 mg | Freq: Three times a day (TID) | INTRAMUSCULAR | Status: DC

## 2017-06-10 NOTE — Evaluation (Signed)
Clinical/Bedside Swallow Evaluation Patient Details  Name: Kelli Velasquez MRN: [id number] Date of Birth: 1940-09-30  Today's Date: 06/10/2017 Time: SLP Start Time (ACUTE ONLY): 0951 SLP Stop Time (ACUTE ONLY): 1011 SLP Time Calculation (min) (ACUTE ONLY): 20 min  Past Medical History:  Past Medical History:  Diagnosis Date  . Allergy    allergic rhinitis  . Asthma   . Chronic bronchitis ([place])   . Coronary artery disease    cath January 2011 with DEs LAD and RCA  . Dyspnea   . Full dentures   . Gallstones   . GERD (gastroesophageal reflux disease)   . History of echocardiogram    Echo 5/17: mod LVH, EF 50-55%, ant-septal HK, Gr 1 DD, mod LAE //  b.  Echo 7/17: mild concentric LVH, EF 45-50%, inf-lat, inf, inf-septal HK, mild LAE  . History of nuclear stress test    Myoview 7/17: EF 48%, small mild apical defect, no ischemia, low risk  . Hyperlipidemia   . Hypertension   . Myocardial infarction ([place])    subendocardial, initial episode, 2010 two stents placed  . Myopia   . 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 Nocturnal oxygen desaturation    o2 at night  . Obesity   . Osteoarthritis    knees, fingers, shoulders  . Overactive bladder   . Oxygen deficiency    uses oxygen all day  . Rash    and other non specific skin eruptions  . Retaining fluid    in ankles and feet  . Urinary incontinence   . Vertigo   . Wears glasses    Past Surgical History:  Past Surgical History:  Procedure Laterality Date  . CATARACT EXTRACTION W/PHACO Right 12/16/2016   Procedure: CATARACT EXTRACTION PHACO AND INTRAOCULAR LENS PLACEMENT ([place])  right;  Surgeon: Leandrew Koyanagi, MD;  Location: [facility];  Service: Ophthalmology;  Laterality: Right;  . CATARACT EXTRACTION W/PHACO Left 02/03/2017   Procedure: CATARACT EXTRACTION PHACO AND INTRAOCULAR LENS PLACEMENT ([place])  Left  Complicated;  Surgeon: Leandrew Koyanagi, MD;  Location: [facility];   Service: Ophthalmology;  Laterality: Left;  [place] Uses oxygen   . CHOLECYSTECTOMY  92  . COLONOSCOPY    . CORONARY ANGIOPLASTY WITH STENT PLACEMENT  07/2009   stent  . DILATION AND CURETTAGE OF UTERUS  1984  . INCISIONAL HERNIA REPAIR N/A 05/16/2013   Procedure: HERNIA REPAIR INFRAUMILICAL INCISIONAL;  Surgeon: Joyice Faster. Cornett, MD;  Location: [facility];  Service: General;  Laterality: N/A;  umbilical  . INSERTION OF MESH N/A 05/16/2013   Procedure: INSERTION OF MESH;  Surgeon: Joyice Faster. Cornett, MD;  Location: [facility];  Service: General;  Laterality: N/A;  umbilical  . JOINT REPLACEMENT  2007   right total knee replacement  . TOTAL KNEE ARTHROPLASTY  2010   left , then vocal cord infection post op  . VIDEO BRONCHOSCOPY Bilateral 07/05/2013   Procedure: VIDEO BRONCHOSCOPY WITH FLUORO;  Surgeon: Tanda Rockers, MD;  Location: [facility];  Service: Cardiopulmonary;  Laterality: Bilateral;  . vocal cord polypectomy     HPI:  76yo female former smoker (quit 1984) with hx CAD, HTN, CHF, BOOP followed by Dr. Melvyn Novas on chronic prednisone presented 10/31 with progressive SOB, cough, wheezing, BLE edema. She had significant respiratory distress in ER and failed trial bipap requiring intubation 10/31-11/4. FEES 11/6 showed severe edema and weakness, standing secretions, and diffuse residue that could  not be cleared. Recommendations at that time were to remain NPO except for a few ice chips given after oral care. Pt was reintubated 11/7-11/12 and stayed on BiPAP post-extubation. SLP was consulted to reassess swallowing.   Assessment / Plan / Recommendation Clinical Impression  Pt is aphonic with weak cough after her second prolonged intubation. After the initial intubation she was significantly weak with laryngeal irritation, and I suspect that this has only worsened given her second intubation. SLP provided oral care and very small ice chips to moisten the  oropharyngeal mucosa, but with coughing noted. Recommend that she remain NPO with emphasis on oral care only. SLP will f/u for readiness to initiate ice chips and/or participate in FEES, which will likely be needed before any significant amount of PO intake occurs. SLP Visit Diagnosis: Dysphagia, oropharyngeal phase (R13.12)    Aspiration Risk  Severe aspiration risk    Diet Recommendation NPO;Alternative means - temporary   Medication Administration: Via alternative means    Other  Recommendations Oral Care Recommendations: Oral care QID Other Recommendations: Have oral suction available   Follow up Recommendations Skilled Nursing facility      Frequency and Duration min 2x/week  2 weeks       Prognosis Prognosis for Safe Diet Advancement: Good Barriers to Reach Goals: Severity of deficits      Swallow Study   General HPI: 76yo female former smoker (quit 1984) with hx CAD, HTN, CHF, BOOP followed by Dr. Melvyn Novas on chronic prednisone presented 10/31 with progressive SOB, cough, wheezing, BLE edema. She had significant respiratory distress in ER and failed trial bipap requiring intubation 10/31-11/4. FEES 11/6 showed severe edema and weakness, standing secretions, and diffuse residue that could not be cleared. Recommendations at that time were to remain NPO except for a few ice chips given after oral care. Pt was reintubated 11/7-11/12 and stayed on BiPAP post-extubation. SLP was consulted to reassess swallowing. Type of Study: Bedside Swallow Evaluation Previous Swallow Assessment: see HPI Diet Prior to this Study: NPO;NG Tube Temperature Spikes Noted: No Respiratory Status: Nasal cannula History of Recent Intubation: Yes Length of Intubations (days): 11 days(across 2 intubations) Date extubated: 06/07/17 Behavior/Cognition: Alert;Cooperative;Requires cueing Oral Cavity Assessment: Dry;Dried secretions(secretions adhered to anterior portion of tongue) Oral Care Completed by SLP:  Yes Oral Cavity - Dentition: Edentulous Patient Positioning: Upright in bed Baseline Vocal Quality: Aphonic Volitional Cough: Weak Volitional Swallow: Able to elicit    Oral/Motor/Sensory Function Overall Oral Motor/Sensory Function: Generalized oral weakness   Ice Chips Ice chips: Impaired Presentation: Spoon Pharyngeal Phase Impairments: Cough - Immediate   Thin Liquid Thin Liquid: Not tested    Nectar Thick Nectar Thick Liquid: Not tested   Honey Thick Honey Thick Liquid: Not tested   Puree Puree: Not tested   Solid   GO   Solid: Not tested        [name] 06/10/2017,10:26 AM   [name], M.A. CCC-SLP [phone]

## 2017-06-10 NOTE — Progress Notes (Signed)
Inpatient Diabetes Program Recommendations  AACE/ADA: New Consensus Statement on Inpatient Glycemic Control (2015)  Target Ranges:  Prepandial:   less than 140 mg/dL      Peak postprandial:   less than 180 mg/dL (1-2 hours)      Critically ill patients:  140 - 180 mg/dL   Results for Kelli Velasquez, Kelli Velasquez (MRN [id number]) as of 06/10/2017 09:59  Ref. Range 06/09/2017 04:22 06/09/2017 07:54 06/09/2017 11:47 06/09/2017 15:39 06/09/2017 20:37 06/10/2017 00:13 06/10/2017 04:54 06/10/2017 08:40  Glucose-Capillary Latest Ref Range: 65 - 99 mg/dL 159 (H) 145 (H) 262 (H) 243 (H) 192 (H) 222 (H) 170 (H) 118 (H)   Review of Glycemic Control  Current orders for Inpatient glycemic control: Lantus 20 units daily, Novolog 0-20 units Q4H; Prednisone 20 mg QAM  Inpatient Diabetes Program Recommendations: Insulin - Tube Feeding Coverage: Please consider ordering Novolog 3 units Q4H for tube feeding coverage. Novolog tube feeding coverage would be held or stopped if tube feeding is held or stopped.  Thanks, Barnie Alderman, RN, MSN, CDE Diabetes Coordinator Inpatient Diabetes Program [phone] (Team Pager from 8am to 5pm)

## 2017-06-10 NOTE — Progress Notes (Signed)
PULMONARY / CRITICAL CARE MEDICINE   Name: Kelli Velasquez MRN: [id number] DOB: 02/04/1941    ADMISSION DATE:  05/26/2017 CONSULTATION DATE:  Admitted 10/31 > extubated 11/4 and to SDU 11/5. 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  She had significant respiratory distress in ER and failed trial bipap requiring intubation.  Initial w/u revealed BNP >700, WBC 21.4 (chronic steroids), hypercarbic respiratory failure with pH 7.211, PCO2 99.4. She required intubation and responded well to diuresis. She was extubated 11/4 and transferred out to SDU the following day. Further diuresis limited by arrhythmia and electrolyte abnormality. Failed swallow eval.    PAST MEDICAL HISTORY :  She  has a past medical history of Allergy, Asthma, Chronic bronchitis ([place]), Coronary artery disease, Dyspnea, Full dentures, Gallstones, GERD (gastroesophageal reflux disease), History of echocardiogram, History of nuclear stress test, Hyperlipidemia, Hypertension, Myocardial infarction ([place]), Myopia, Neoplasm of skin, Nocturnal oxygen desaturation, Obesity, Osteoarthritis, Overactive bladder, Oxygen deficiency, Rash, Retaining fluid, Urinary incontinence, Vertigo, and Wears glasses.  PAST SURGICAL HISTORY: She  has a past surgical history that includes Total knee arthroplasty (2010); vocal cord polypectomy; Cholecystectomy (92); Joint replacement (2007); Dilation and curettage of uterus (1984); Colonoscopy; Coronary angioplasty with stent (07/2009); Incisional hernia repair (N/A, 05/16/2013); Insertion of mesh (N/A, 05/16/2013); Video bronchoscopy (Bilateral, 07/05/2013); Cataract extraction w/PHACO (Right, 12/16/2016); and Cataract extraction w/PHACO (Left, 02/03/2017).  Allergies  Allergen  Reactions  . Fish Allergy Anaphylaxis  . Shellfish Allergy Anaphylaxis  . Aspirin     REACTION: nausea and vomiting High doses  . Atorvastatin     REACTION: leg pain  . Crestor [Rosuvastatin Calcium] Other (See Comments)    Muscle pain - allergy/intolerance  . Penicillins     Due to mold allergy per pt  . Simvastatin     REACTION: muscle pain  . Trandolapril     REACTION: leg pain    No current facility-administered medications on file prior to encounter.    Current Outpatient Medications on File Prior to Encounter  Medication Sig  . acetaminophen (TYLENOL) 325 MG tablet Take 650 mg by mouth every 6 (six) hours as needed for mild pain. Take as needed per bottle   . acetaminophen-codeine (TYLENOL #3) 300-30 MG tablet One every 4 hours as needed for cough  . amLODipine (NORVASC) 10 MG tablet Take 1 tablet (10 mg total) by mouth daily.  Marland Kitchen aspirin 81 MG tablet Take 81 mg by mouth daily.  . chlorpheniramine (CHLOR-TRIMETON) 4 MG tablet Take 4 mg by mouth every morning.   . cromolyn (NASALCROM) 5.2 MG/ACT nasal spray Place 2 sprays into both nostrils daily as needed for allergies.  . cyanocobalamin (,VITAMIN B-12,) 1000 MCG/ML injection Inject 1,000 mcg into the muscle every 3 (three) months.  . Cyanocobalamin (VITAMIN B12) 3000 MCG SUBL Place 1 capsule under the tongue daily.  Marland Kitchen dextromethorphan-guaiFENesin (MUCINEX DM) 30-600 MG 12hr tablet Take 1 tablet by mouth 2 (two) times daily as needed for cough.  . EPIPEN 2-PAK 0.3 MG/0.3ML SOAJ injection INJECT 0.3 MLS (0.3 MG TOTAL) INTO THE MUSCLE ONCE AS NEEDED FOR ALLERGIC REACTION  . esomeprazole (NEXIUM) 40 MG capsule Take 1 capsule (40 mg total) by mouth daily.  Marland Kitchen ezetimibe (ZETIA) 10 MG tablet TAKE 1 TABLET (10 MG TOTAL)  BY MOUTH DAILY.  . furosemide (LASIX) 20 MG tablet TAKE 1 TABLET BY MOUTH EVERY DAY  . furosemide (LASIX) 40 MG tablet Take 1 tablet (40 mg total) by mouth daily. Take 1 daily as needed for extra swelling (Patient  taking differently: Take 40 mg by mouth daily as needed for fluid. Take 1 daily as needed for extra swelling )  . montelukast (SINGULAIR) 10 MG tablet Take 1 tablet (10 mg total) by mouth at bedtime.  . Multiple Vitamin (MULTIVITAMIN) capsule Take 1 capsule by mouth daily.  . OXYGEN Inhale 3 L into the lungs continuous. 24/7 2 lpm  [employer]  . potassium chloride SA (KLOR-CON M20) 20 MEQ tablet Take 1 tablet (20 mEq total) by mouth daily as needed. (Patient taking differently: Take 20 mEq by mouth daily. )  . PREDNISONE PO Take 15-20 mg by mouth daily. Take 20 mg one day and 15 mg the next days  . PROAIR HFA 108 (90 BASE) MCG/ACT inhaler INHALE 2 PUFFS BY MOUTH EVERY 4 HOURS AS NEEDED FOR WHEEZING OR FOR SHORTNESS OF BREATH  . ranitidine (ZANTAC) 75 MG tablet Take 300 mg by mouth at bedtime.     FAMILY HISTORY:  Her indicated that her mother is deceased. She indicated that her father is deceased. She indicated that the status of her maternal grandmother is unknown. She indicated that the status of her neg hx is unknown.  SOCIAL HISTORY: She  reports that she quit smoking about 34 years ago. Her smoking use included cigarettes. She has a 25.00 pack-year smoking history. she has never used smokeless tobacco. She reports that she drinks alcohol. She reports that she does not use drugs.  REVIEW OF SYSTEMS:   Patient denies CP, SOB, abd pain.   SUBJECTIVE:  Tolerated [place] almost all day 11/14, back on Bipap at shift change for WOB. On [place] this am, slightly confused. Denies pain.   VITAL SIGNS: BP (!) 163/137   Pulse 62   Temp 98.9 F (37.2 C) (Axillary)   Resp (!) 28   Ht _0  (1.626 m)   Wt 238 lb 15.7 oz (108.4 kg)   LMP 07/27/1980   SpO2 100%   BMI 41.02 kg/m   HEMODYNAMICS: CVP:  [8 mmHg-14 mmHg] 14 mmHg  VENTILATOR SETTINGS: FiO2 (%):  [40 %] 40 %  INTAKE / OUTPUT: I/O last 3 completed shifts: In: 4065 [I.V.:1750; NG/GT:2215; IV Piggyback:100] Out: 4000 [Urine:4000]  PHYSICAL  EXAMINATION: General:  Obese female lying in bed in NAD on [place].  Neuro: AOx3, CN II-XII grossly intact.  HEENT:  Normocephalic atraumatic Cardiovascular:  Irregular rhythm, normal rate, no MRG, 1+ LLE.  Lungs:  Good air movement, coarse breath sounds throughout.  Abdomen:  SNTND, +BS Musculoskeletal:  WWP, minimal BLE.  Skin:  Intact, warm, well perfused.   LABS:  BMET Recent Labs  Lab 06/08/17 0423 06/09/17 0507 06/10/17 0500  NA 150* 148* 148*  K 3.8 3.5 3.4*  CL 112* 109 106  CO2 31 32 34*  BUN 33* 42* 33*  CREATININE 0.71 0.97 0.84  GLUCOSE 94 151* 220*    Electrolytes Recent Labs  Lab 06/06/17 0504 06/06/17 0505 06/07/17 0514 06/08/17 0423 06/09/17 0507 06/10/17 0500  CALCIUM  --  8.5* 8.6* 9.3 9.4 9.0  MG 2.0  --  2.1 2.4  --   --   PHOS  --  3.3 2.6 2.9  --   --     CBC Recent Labs  Lab 06/08/17 0423  06/09/17 0507 06/10/17 0500  WBC 32.4* 22.9* 21.5*  HGB 13.0 12.8 12.9  HCT 44.8 43.5 44.4  PLT 140* 116* 98*    Coag's No results for input(s): APTT, INR in the last 168 hours.  Sepsis Markers Recent Labs  Lab 06/04/17 0540  PROCALCITON <0.10    ABG No results for input(s): PHART, PCO2ART, PO2ART in the last 168 hours.  Liver Enzymes Recent Labs  Lab 06/06/17 0505 06/07/17 0514 06/08/17 0423  ALBUMIN 2.3* 2.2* 2.7*    Cardiac Enzymes No results for input(s): TROPONINI, PROBNP in the last 168 hours.  Glucose Recent Labs  Lab 06/09/17 0754 06/09/17 1147 06/09/17 1539 06/09/17 2037 06/10/17 0013 06/10/17 0454  GLUCAP 145* 262* 243* 192* 222* 170*    Imaging Dg Chest Port 1 View  Result Date: 06/10/2017 CLINICAL DATA:  Aspiration pneumonia EXAM: PORTABLE CHEST 1 VIEW COMPARISON:  06/09/2017 FINDINGS: A feeding tube extends into the stomach. Right-sided PICC line tip: Lower SVC. The patient is rotated to the left on today's radiograph, reducing diagnostic sensitivity and specificity. Low lung volumes are present, causing  crowding of the pulmonary vasculature. Atherosclerotic calcification of the aortic arch. Retrocardiac and left basilar airspace opacity noted with mild right retro diaphragmatic airspace opacity potentially from aspiration pneumonia or atelectasis. IMPRESSION: 1. Essentially stable bibasilar airspace opacities potentially from aspiration pneumonia or atelectasis. 2. Low lung volumes. 3. Cardiomegaly. 4.  Aortic Atherosclerosis (ICD10-I70.0). Electronically Signed   By: Van Clines M.D.   On: 06/10/2017 07:15    STUDIES:  Echo 10/31>>> EF 35-40%, diffuse hypokinesis CT chest 11/7 read pending > RLL consolidation, no appreciable effusion    CULTURES: BCx2 11/7 > final no growth BAL 11/7 >30,000 colonies normal respiratory flora AFB smear negative Viral sputum culture negative at 4 days.   ANTIBIOTICS: Diflucan (Oral thrush) 11/3>>   Ceftazidine 11/7 > ceftriaxone 11/10 > end date 11/16 Vancomycin 11/7 > 11/8  SIGNIFICANT EVENTS: Admit 10/31 11/4 extubate 11/5 transfer SDU 11/7 transfer ICU reintubated. 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 Improved initially with diuresis and was extuabted successfully, however, two days later 11/7 became hypoxemic/hypercarbic requiring reintubation. Extubated 11/12.   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 Echo with EF 35-40% and diffuse hypokinesis Troponin elevation on  initial admit, decreased 11/7 Hx HTN.  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 Worsened 11/13, likely 2/2 hemoconcentration.  New thrombocytopenia > HITT panel elevated, SRA negative P:  -daily  CBC  INFECTIOUS A: MSSA PNA purulent secretions noted during intubation. Bilateral infiltrates. PCT negative.  Oral thrush Plan:  -continue ceftriaxone for 10 day course -Follow fever curve and WBC  ENDOCRINE Hyperglycemia > used 36U SSI last 24 hrs P: -CBG Q 4   -resistant SSI 2/2 steroids -Lantus 20U QD  - D5 0.45% NaCL to 53m/hr  NEUROLOGIC A: Acute metabolic encephalopathy, resolved. P: -Fentanyl PRN for analgesia  FAMILY  - Updates: no family at bedside this am.   KRalene Ok MD PGY-2, [facility] 06/10/2017 7:24 AM

## 2017-06-10 NOTE — Progress Notes (Signed)
Physical Therapy Treatment Patient Details Name: Kelli Velasquez MRN: [id number] DOB: 01/14/1941 Today's Date: 06/10/2017    History of Present Illness 76 yo female admitted for progressive respiratory distress.  She then developed altered mental status.  Pt has a past medical history including Allergy, Asthma, Chronic bronchitis, CAD, Dyspnea, GERD, Hyperlipidemia, Hypertension, Myocardial infarction, Myopia, Neoplasm of skin, Nocturnal oxygen desaturation, Obesity, Osteoarthritis, Oxygen deficiency,  Urinary incontinence, and Vertigo. Required intubation 10/31-11/4 and  11/6-11/14, now off Bipap.    PT Comments    Pt making slow progress with mobility but able to tolerate sitting EOB with supervision ~15 minutes. Pt continues to require two person physical assistance for bed mobility. During activity, pt's SPO2 at 100% throughout with pt on 5L of O2 via [place]. Of note, pt's HR fluctuating between 100-125 bpm. Pt would continue to benefit from skilled physical therapy services at this time while admitted and after d/c to address the below listed limitations in order to improve overall safety and independence with functional mobility.  A Palliative Consult might be beneficial in this situation.   Follow Up Recommendations  SNF;Supervision/Assistance - 24 hour     Equipment Recommendations  None recommended by PT;Other (comment)(defer to next venue)    Recommendations for Other Services       Precautions / Restrictions Precautions Precautions: Fall Precaution Comments: watch O2 saturations and HR Restrictions Weight Bearing Restrictions: No    Mobility  Bed Mobility Overal bed mobility: Needs Assistance Bed Mobility: Supine to Sit;Sit to Supine     Supine to sit: Mod assist;HOB elevated Sit to supine: Mod assist;+2 for physical assistance   General bed mobility comments: cueing for sequencing and to maintain attention to task; assist to elevate trunk and position hips at  EOB  Transfers                 General transfer comment: not attempted this session  Ambulation/Gait                 Stairs            Wheelchair Mobility    Modified Rankin (Stroke Patients Only)       Balance Overall balance assessment: Needs assistance Sitting-balance support: Feet unsupported;No upper extremity supported Sitting balance-Leahy Scale: Good Sitting balance - Comments: sitting EOB at supervision level                                    Cognition Arousal/Alertness: Awake/alert Behavior During Therapy: WFL for tasks assessed/performed Overall Cognitive Status: Difficult to assess                                 General Comments: Pt make appropriate jokes during session, perseverates on home      Exercises      General Comments General comments (skin integrity, edema, etc.): Pt's husband and daughter present throughout session      Pertinent Vitals/Pain Pain Assessment: No/denies pain Pain Intervention(s): Monitored during session    Home Living Family/patient expects to be discharged to:: Private residence Living Arrangements: Spouse/significant other Available Help at Discharge: Family Type of Home: House Home Access: Stairs to enter   Home Layout: Two level;Able to live on main level with bedroom/bathroom Home Equipment: Shower seat      Prior Function Level of Independence: Independent  PT Goals (current goals can now be found in the care plan section) Acute Rehab PT Goals Patient Stated Goal: go home PT Goal Formulation: With patient Time For Goal Achievement: 06/28/17 Potential to Achieve Goals: Fair Progress towards PT goals: Progressing toward goals    Frequency    Min 3X/week      PT Plan Current plan remains appropriate    Co-evaluation PT/OT/SLP Co-Evaluation/Treatment: Yes Reason for Co-Treatment: Complexity of the patient's impairments (multi-system  involvement);For patient/therapist safety;To address functional/ADL transfers PT goals addressed during session: Mobility/safety with mobility;Balance;Strengthening/ROM OT goals addressed during session: ADL's and self-care;Other (comment)(balance)      AM-PAC PT "6 Clicks" Daily Activity  Outcome Measure  Difficulty turning over in bed (including adjusting bedclothes, sheets and blankets)?: A Lot Difficulty moving from lying on back to sitting on the side of the bed? : Unable Difficulty sitting down on and standing up from a chair with arms (e.g., wheelchair, bedside commode, etc,.)?: Unable Help needed moving to and from a bed to chair (including a wheelchair)?: Total Help needed walking in hospital room?: Total Help needed climbing 3-5 steps with a railing? : Total 6 Click Score: 7    End of Session Equipment Utilized During Treatment: Oxygen(5L of O2 via [place]) Activity Tolerance: Patient limited by fatigue Patient left: in bed;with call bell/phone within reach;with family/visitor present;with SCD's reapplied Nurse Communication: Mobility status;Need for lift equipment PT Visit Diagnosis: Unsteadiness on feet (R26.81);Difficulty in walking, not elsewhere classified (R26.2)     Time: 9924-2683 PT Time Calculation (min) (ACUTE ONLY): 30 min  Charges:  $Therapeutic Activity: 8-22 mins                    G Codes:       [place], [place], [place] 06/10/2017, 3:53 PM

## 2017-06-10 NOTE — Progress Notes (Signed)
CSW following for potential need for SNF.     Kelli Velasquez, MSW, [facility] Clinical Social Worker [phone]

## 2017-06-10 NOTE — Evaluation (Signed)
Occupational Therapy Evaluation Patient Details Name: Kelli Velasquez MRN: [id number] DOB: [birth year] Today's Date: 06/10/2017    History of Present Illness 76 yo female admitted for progressive respiratory distress.  She then developed altered mental status.  Pt has a past medical history including Allergy, Asthma, Chronic bronchitis, CAD, Dyspnea, GERD, Hyperlipidemia, Hypertension, Myocardial infarction, Myopia, Neoplasm of skin, Nocturnal oxygen desaturation, Obesity, Osteoarthritis, Oxygen deficiency,  Urinary incontinence, and Vertigo. Required intubation 10/31-11/4 and  11/6-11/14, remains on Bipap.   Clinical Impression   PTA Pt was independent in ADL and mobility (has shower chair). Pt is currently set up for grooming tasks sitting EOB, mod A for LB ADL, mod A for bathing tasks, and presents with decreased activity tolerance, balance, and generalized weakness. Please see below for full problem list. VSS throughout with O2 levels staying at 100% even with movement and coming EOB, and HR varied between 100-125. Pt with good sense of humor and positive interactions throughout session. Pt will benefit from skilled OT in the acute setting and afterwards at SNF level to maximize safety and independence in ADL and mobility. It is the opinion of this therapist that Palliative medicine should be involved as a part of the care team.     Follow Up Recommendations  SNF;Supervision/Assistance - 24 hour(Pt really wants to go home)    Equipment Recommendations  Other (comment)(defer to next venue)    Recommendations for Other Services Other (comment)(Palliative)     Precautions / Restrictions Precautions Precautions: Fall Precaution Comments: watch O2 saturations and HR Restrictions Weight Bearing Restrictions: No      Mobility Bed Mobility Overal bed mobility: Needs Assistance Bed Mobility: Supine to Sit;Sit to Supine     Supine to sit: HOB elevated;Mod assist(mod A to bring hips EOB,  Otherwise pt able to use rails) Sit to supine: Mod assist;+2 for physical assistance(assist for BLE back into bed, and assist for safe trunk)      Transfers                 General transfer comment: not attempted this session    Balance Overall balance assessment: Needs assistance Sitting-balance support: Feet supported;No upper extremity supported Sitting balance-Leahy Scale: Good Sitting balance - Comments: sitting EOB at supervision level                                   ADL either performed or assessed with clinical judgement   ADL Overall ADL's : Needs assistance/impaired Eating/Feeding: NPO   Grooming: Oral care;Set up;Sitting Grooming Details (indicate cue type and reason): EOB, brushed mouth/gums with swab and put on chapstick Upper Body Bathing: Moderate assistance   Lower Body Bathing: Maximal assistance   Upper Body Dressing : Minimal assistance;Sitting   Lower Body Dressing: Maximal assistance   Toilet Transfer: Total assistance Toilet Transfer Details (indicate cue type and reason): Pt with foley cath at this time Toileting- Clothing Manipulation and Hygiene: Total assistance;Bed level       Functional mobility during ADLs: (not attempted this session)       Vision         Perception     Praxis      Pertinent Vitals/Pain Pain Assessment: No/denies pain Pain Intervention(s): Monitored during session     Hand Dominance Right   Extremity/Trunk Assessment Upper Extremity Assessment Upper Extremity Assessment: Generalized weakness(L seems weaker than R at this time, movement [facility])   Lower Extremity  Assessment Lower Extremity Assessment: Defer to PT evaluation   Cervical / Trunk Assessment Cervical / Trunk Assessment: Normal(extra body habitis )   Communication Communication Communication: Expressive difficulties;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)   Cognition Arousal/Alertness: Awake/alert Behavior During Therapy: WFL  for tasks assessed/performed Overall Cognitive Status: Difficult to assess                                 General Comments: Pt make appropriate jokes during session, perseverates on home   General Comments  Pt's husband and daughter present during session    Exercises     Shoulder Instructions      Home Living Family/patient expects to be discharged to:: Private residence Living Arrangements: Spouse/significant other Available Help at Discharge: Family Type of Home: House Home Access: Stairs to enter Entrance Stairs-Number of Steps: 1   Home Layout: Two level;Able to live on main level with bedroom/bathroom     Bathroom Shower/Tub: Occupational psychologist: Handicapped height     Home Equipment: Shower seat          Prior Functioning/Environment Level of Independence: Independent                 OT Problem List: Decreased strength;Decreased activity tolerance;Impaired balance (sitting and/or standing);Decreased knowledge of use of DME or AE;Cardiopulmonary status limiting activity;Obesity;Increased edema      OT Treatment/Interventions: Self-care/ADL training;Therapeutic exercise;Energy conservation;Therapeutic activities;Patient/family education;Balance training    OT Goals(Current goals can be found in the care plan section) Acute Rehab OT Goals Patient Stated Goal: go home OT Goal Formulation: With patient/family Time For Goal Achievement: 06/24/17 Potential to Achieve Goals: Fair ADL Goals Pt Will Perform Grooming: with modified independence;sitting Pt Will Perform Upper Body Bathing: with min assist;with caregiver independent in assisting;sitting Pt Will Transfer to Toilet: with mod assist;stand pivot transfer;bedside commode Pt Will Perform Toileting - Clothing Manipulation and hygiene: with mod assist;sit to/from stand Additional ADL Goal #1: Pt will perform bed mobility at supervision level prior to engaging in ADL activity.   OT Frequency: Min 2X/week   Barriers to D/C:            Co-evaluation PT/OT/SLP Co-Evaluation/Treatment: Yes Reason for Co-Treatment: Complexity of the patient's impairments (multi-system involvement);For patient/therapist safety;To address functional/ADL transfers   OT goals addressed during session: ADL's and self-care;Other (comment)(balance)      AM-PAC PT "6 Clicks" Daily Activity     Outcome Measure Help from another person eating meals?: Total Help from another person taking care of personal grooming?: A Little Help from another person toileting, which includes using toliet, bedpan, or urinal?: Total Help from another person bathing (including washing, rinsing, drying)?: A Lot Help from another person to put on and taking off regular upper body clothing?: A Lot Help from another person to put on and taking off regular lower body clothing?: A Lot 6 Click Score: 11   End of Session Equipment Utilized During Treatment: Oxygen(5L) Nurse Communication: Mobility status  Activity Tolerance: Patient tolerated treatment well(VSS throughout. HR fluctuated from 100-125. ) Patient left: in bed;with call bell/phone within reach;with family/visitor present;with SCD's reapplied;Other (comment)(beginning respiratory therapy)  OT Visit Diagnosis: Muscle weakness (generalized) (M62.81);Unsteadiness on feet (R26.81);Other abnormalities of gait and mobility (R26.89);History of falling (Z91.81)                Time: 0200-0231 OT Time Calculation (min): 31 min Charges:  OT General Charges $OT Visit:  1 Visit OT Evaluation $OT Eval Moderate Complexity: 1 Mod G-Codes:     Hulda Humphrey OTR/L [place] 06/10/2017, 3:32 PM

## 2017-06-10 NOTE — Progress Notes (Signed)
Patient ID: Kelli Velasquez, female   DOB: Nov 30, 1940, 76 y.o.   MRN: [id number]      Advanced Heart Failure Rounding Note  Subjective:    Over 2 weeks prior to admission, she had increased dyspnea with exertion, she was short of breath at rest on the day of admission.  Her husband says she has been taking 20 mg of lasix daily and occasionally an extra 20 mg of lasix.   Presented to [facility] ED with respiratory distress. Family reported increased dyspnea over the last week requiring 2 liters oxygen at home. In ED she received solumedrol and duonebs. Placed on Bipap but continue decline requiring intubation. CXR concerning for interstitial edema and bilateral effusions.  Extubated 11/4. Noted to have swelling of upper airways on speech/swallow evaluation 11/6.  Developed respiratory distress 11/6 in the evening with hypoxia and hypercapnia and was re-intubated.    Extubated 06/07/17.   Off bipap. On 6 liters. CVP 8.    Denies CP. Denies SOB.   CXR 06/09/17 with LLL PNA  Studies:  Echo 05/26/2017 EF 35-40%  Grade I DD RV normal Echo 2017 EF 45-50% with WMA Myoview 2017 No significant ischemia EF 48%.  [place] 2011- DES LAD and RCA.   Objective:   Weight Range: 238 lb 15.7 oz (108.4 kg) Body mass index is 41.02 kg/m.   Vital Signs:   Temp:  [98.3 F (36.8 C)-98.9 F (37.2 C)] 98.9 F (37.2 C) (11/15 0400) Pulse Rate:  [29-116] 109 (11/15 1000) Resp:  [26-35] 26 (11/15 1000) BP: (104-172)/(50-137) 157/88 (11/15 1000) SpO2:  [85 %-100 %] 100 % (11/15 1000) FiO2 (%):  [40 %] 40 % (11/15 0400) Weight:  [238 lb 15.7 oz (108.4 kg)] 238 lb 15.7 oz (108.4 kg) (11/15 0500) Last BM Date: 06/10/17  Weight change: Filed Weights   06/08/17 0500 06/09/17 0458 06/10/17 0500  Weight: 243 lb 2.7 oz (110.3 kg) 243 lb 6.2 oz (110.4 kg) 238 lb 15.7 oz (108.4 kg)   Intake/Output:   Intake/Output Summary (Last 24 hours) at 06/10/2017 1041 Last data filed at 06/10/2017 1000 Gross per 24 hour    Intake 2520 ml  Output 3450 ml  Net -930 ml    Physical Exam  CVP 8 General:  Appears chronically ill. No resp difficulty HEENT: normal Neck: supple. JVD hard to assess due to body habitus. Carotids 2+ bilat; no bruits. No lymphadenopathy or thryomegaly appreciated. Cor: PMI nondisplaced. Regular rate & rhythm. No rubs, gallops or murmurs. Lungs: On 6 liters oxygen. Coarse throughout.  Abdomen: obese, soft, nontender, nondistended. No hepatosplenomegaly. No bruits or masses. Good bowel sounds. Extremities: no cyanosis, clubbing, rash, edema. RUE PICC  Neuro: alert & orientedx3, cranial nerves grossly intact. moves all 4 extremities w/o difficulty. Affect pleasant   Telemetry   Sinus Tach PVCs 100s personally reviewed.   Labs    CBC Recent Labs    06/08/17 0423 06/09/17 0507 06/10/17 0500  WBC 32.4* 22.9* 21.5*  NEUTROABS 29.8*  --   --   HGB 13.0 12.8 12.9  HCT 44.8 43.5 44.4  MCV 102.3* 101.2* 100.9*  PLT 140* 116* 98*   Basic Metabolic Panel Recent Labs    06/08/17 0423 06/09/17 0507 06/10/17 0500  NA 150* 148* 148*  K 3.8 3.5 3.4*  CL 112* 109 106  CO2 31 32 34*  GLUCOSE 94 151* 220*  BUN 33* 42* 33*  CREATININE 0.71 0.97 0.84  CALCIUM 9.3 9.4 9.0  MG 2.4  --   --  PHOS 2.9  --   --    Liver Function Tests Recent Labs    06/08/17 0423  ALBUMIN 2.7*   No results for input(s): LIPASE, AMYLASE in the last 72 hours. Cardiac Enzymes No results for input(s): CKTOTAL, CKMB, CKMBINDEX, TROPONINI in the last 72 hours. BNP: BNP (last 3 results) Recent Labs    05/26/17 0610  BNP 760.9*   ProBNP (last 3 results) Recent Labs    01/11/17 1053  PROBNP 264.0*   D-Dimer No results for input(s): DDIMER in the last 72 hours. Hemoglobin A1C No results for input(s): HGBA1C in the last 72 hours. Fasting Lipid Panel No results for input(s): CHOL, HDL, LDLCALC, TRIG, CHOLHDL, LDLDIRECT in the last 72 hours. Thyroid Function Tests No results for input(s):  TSH, T4TOTAL, T3FREE, THYROIDAB in the last 72 hours.  Invalid input(s): FREET3  Other results:  Imaging   Dg Chest Port 1 View  Result Date: 06/10/2017 CLINICAL DATA:  Aspiration pneumonia EXAM: PORTABLE CHEST 1 VIEW COMPARISON:  06/09/2017 FINDINGS: A feeding tube extends into the stomach. Right-sided PICC line tip: Lower SVC. The patient is rotated to the left on today's radiograph, reducing diagnostic sensitivity and specificity. Low lung volumes are present, causing crowding of the pulmonary vasculature. Atherosclerotic calcification of the aortic arch. Retrocardiac and left basilar airspace opacity noted with mild right retro diaphragmatic airspace opacity potentially from aspiration pneumonia or atelectasis. IMPRESSION: 1. Essentially stable bibasilar airspace opacities potentially from aspiration pneumonia or atelectasis. 2. Low lung volumes. 3. Cardiomegaly. 4.  Aortic Atherosclerosis (ICD10-I70.0). Electronically Signed   By: Van Clines M.D.   On: 06/10/2017 07:15    Medications:     Scheduled Medications: . [START ON 06/11/2017] aspirin  81 mg Per Tube Daily  . chlorhexidine gluconate (MEDLINE KIT)  15 mL Mouth Rinse BID  . Chlorhexidine Gluconate Cloth  6 each Topical Q0600  . ezetimibe  10 mg Per Tube QHS  . feeding supplement (PRO-STAT SUGAR FREE 64)  30 mL Per Tube TID  . furosemide  40 mg Intravenous BID  . hydrALAZINE  37.5 mg Per Tube Q8H   Or  . hydrALAZINE  10 mg Intravenous Q8H  . insulin aspart  0-20 Units Subcutaneous Q4H  . insulin glargine  20 Units Subcutaneous Daily  . ipratropium-albuterol  3 mL Nebulization Q6H  . isosorbide dinitrate  20 mg Per Tube TID  . mouth rinse  15 mL Mouth Rinse QID  . montelukast  10 mg Per Tube QHS  . pantoprazole sodium  40 mg Per Tube Daily  . predniSONE  20 mg Per Tube Q breakfast  . sodium chloride flush  10-40 mL Intracatheter Q12H  . [START ON 06/11/2017] spironolactone  12.5 mg Per Tube Daily     Infusions: . sodium chloride 250 mL (06/10/17 1000)  . cefTRIAXone (ROCEPHIN)  IV Stopped (06/09/17 2146)  . 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  Admitted with acute/chronic respiratory failure  Assessment/Plan   1. Acute/chronic systolic CHF: Probably ischemic cardiomyopathy given prior history.  Echo this admission with EF down to 35-40% range, around 50% in past.  She was admitted with worsening dyspnea, elevated BNP, CXR with pulmonary edema. PCT not elevated.  Has history of BOOP.  MSSA PNA (LLL infiltrate).   CVP ~8.  - Continue 40 mg IV lasix twice a day.  - With fall in EF, would plan R/LHC prior to discharge once she stabilizes off Bipap, etc.  - Increased hydralazine/isordil .   - Continue spironolactone 12.5 daily.   - She has not tolerated beta blockers in the past due to wheezing, would consider starting low dose of beta-1 specific bisoprolol when respiratory status improved. Would not start yet. - With fall in EF, would plan left/right heart cath once she is extubated and stable.  2. Acute hypoxemic respiratory failure: Initially thought multifactorial due to pulmonary edema, effusions, BOOP and OHS.  She is on IV Solumedrol given BOOP with ?component of exacerbation.  She was diuresed.  At baseline, she is on 2L home oxygen.  Extubated 11/4 but developed respiratory distress 11/6 and re-intubated.  She had had swelling of the upper airways noted by speech/swallow exam.  Found to have MSSA PNA.  She is now on ceftriaxone.  - Antibiotics/steroids per CCM.  No change.   3. CAD: h/o DES to LAD and RCA in 2011.  No chest pain but admitted with severe dyspnea/volume overload and fall  in EF on echo.  Troponin mildly elevated but no trend. Suspect most likely demand ischemia from volume overload.  - Given fall in EF, would plan cath when creatinine/respiratory status stabilizes [facility]) => once off Bipap and respiratory status better.  On hold for now.  4.  AKI: Resolved.   5. HTN: Elevated.  Increase hydralazine to 50 mg tid.  6. Code status: Pt is DNR/DNI at this time.  7. NSVT: No recurrence.  Length of Stay: [place], NP  06/10/2017, 10:41 AM  Advanced Heart Failure Team Pager [phone] (M-F; 7a - 4p)  Please contact [facility] for night-coverage after hours (4p -7a ) and weekends on [email or web address]  Patient seen with NP, agree with the above note.  CVP 8.  Respiratory status gradually improving (MSSA PNA), weaned off Bipap to 6 L [place]. She remains NPO per speech/swallow.  - Continue Lasix 40 mg IV bid today.  - With high BP, can increase hydralazine to 50 mg tid and continue isordil.    - Eventual R/LHC when she has recovered more.   Loralie Champagne 06/10/2017 11:05 AM

## 2017-06-11 ENCOUNTER — Inpatient Hospital Stay (HOSPITAL_COMMUNITY): Payer: Medicare Other

## 2017-06-11 DIAGNOSIS — G92 Toxic encephalopathy: Secondary | ICD-10-CM

## 2017-06-11 LAB — CBC WITH DIFFERENTIAL/PLATELET
BASOS ABS: 0 10*3/uL (ref 0.0–0.1)
BASOS PCT: 0 %
EOS ABS: 0.2 10*3/uL (ref 0.0–0.7)
EOS PCT: 1 %
HEMATOCRIT: 40.9 % (ref 36.0–46.0)
Hemoglobin: 11.6 g/dL — ABNORMAL LOW (ref 12.0–15.0)
Lymphocytes Relative: 5 %
Lymphs Abs: 0.8 10*3/uL (ref 0.7–4.0)
MCH: 29.3 pg (ref 26.0–34.0)
MCHC: 28.4 g/dL — ABNORMAL LOW (ref 30.0–36.0)
MCV: 103.3 fL — ABNORMAL HIGH (ref 78.0–100.0)
MONO ABS: 0.7 10*3/uL (ref 0.1–1.0)
MONOS PCT: 4 %
NEUTROS ABS: 15.8 10*3/uL — AB (ref 1.7–7.7)
Neutrophils Relative %: 90 %
PLATELETS: 90 10*3/uL — AB (ref 150–400)
RBC: 3.96 MIL/uL (ref 3.87–5.11)
RDW: 16.5 % — AB (ref 11.5–15.5)
WBC: 17.5 10*3/uL — ABNORMAL HIGH (ref 4.0–10.5)

## 2017-06-11 LAB — GLUCOSE, CAPILLARY
GLUCOSE-CAPILLARY: 123 mg/dL — AB (ref 65–99)
GLUCOSE-CAPILLARY: 132 mg/dL — AB (ref 65–99)
GLUCOSE-CAPILLARY: 200 mg/dL — AB (ref 65–99)
GLUCOSE-CAPILLARY: 204 mg/dL — AB (ref 65–99)
GLUCOSE-CAPILLARY: 237 mg/dL — AB (ref 65–99)
Glucose-Capillary: 158 mg/dL — ABNORMAL HIGH (ref 65–99)
Glucose-Capillary: 234 mg/dL — ABNORMAL HIGH (ref 65–99)

## 2017-06-11 LAB — BLOOD GAS, ARTERIAL
ACID-BASE EXCESS: 13.1 mmol/L — AB (ref 0.0–2.0)
BICARBONATE: 38.1 mmol/L — AB (ref 20.0–28.0)
DRAWN BY: 51831
Delivery systems: POSITIVE
Expiratory PAP: 5
FIO2: 35
Inspiratory PAP: 16
O2 Saturation: 96.5 %
PATIENT TEMPERATURE: 98.6
PCO2 ART: 58.6 mmHg — AB (ref 32.0–48.0)
PH ART: 7.429 (ref 7.350–7.450)
PO2 ART: 92.9 mmHg (ref 83.0–108.0)

## 2017-06-11 LAB — CBC
HEMATOCRIT: 42.1 % (ref 36.0–46.0)
Hemoglobin: 12.1 g/dL (ref 12.0–15.0)
MCH: 29.7 pg (ref 26.0–34.0)
MCHC: 28.7 g/dL — ABNORMAL LOW (ref 30.0–36.0)
MCV: 103.4 fL — AB (ref 78.0–100.0)
PLATELETS: 90 10*3/uL — AB (ref 150–400)
RBC: 4.07 MIL/uL (ref 3.87–5.11)
RDW: 16.4 % — AB (ref 11.5–15.5)
WBC: 18.1 10*3/uL — AB (ref 4.0–10.5)

## 2017-06-11 LAB — BASIC METABOLIC PANEL
Anion gap: 7 (ref 5–15)
Anion gap: 7 (ref 5–15)
BUN: 33 mg/dL — AB (ref 6–20)
BUN: 40 mg/dL — ABNORMAL HIGH (ref 6–20)
CALCIUM: 9.2 mg/dL (ref 8.9–10.3)
CO2: 37 mmol/L — ABNORMAL HIGH (ref 22–32)
CO2: 37 mmol/L — AB (ref 22–32)
CREATININE: 0.88 mg/dL (ref 0.44–1.00)
Calcium: 9.1 mg/dL (ref 8.9–10.3)
Chloride: 107 mmol/L (ref 101–111)
Chloride: 106 mmol/L (ref 101–111)
Creatinine, Ser: 0.79 mg/dL (ref 0.44–1.00)
Glucose, Bld: 122 mg/dL — ABNORMAL HIGH (ref 65–99)
Glucose, Bld: 127 mg/dL — ABNORMAL HIGH (ref 65–99)
POTASSIUM: 3.7 mmol/L (ref 3.5–5.1)
Potassium: 3.7 mmol/L (ref 3.5–5.1)
SODIUM: 151 mmol/L — AB (ref 135–145)
Sodium: 150 mmol/L — ABNORMAL HIGH (ref 135–145)

## 2017-06-11 MED ORDER — FREE WATER
100.0000 mL | Freq: Three times a day (TID) | Status: DC
  Administered 2017-06-11 – 2017-06-12 (×3): 100 mL

## 2017-06-11 MED ORDER — NALOXONE HCL 0.4 MG/ML IJ SOLN
INTRAMUSCULAR | Status: AC
  Filled 2017-06-11: qty 1

## 2017-06-11 MED ORDER — NALOXONE HCL 0.4 MG/ML IJ SOLN
0.4000 mg | INTRAMUSCULAR | Status: AC
  Administered 2017-06-11: 0.4 mg via INTRAVENOUS

## 2017-06-11 MED ORDER — FUROSEMIDE 10 MG/ML IJ SOLN
40.0000 mg | Freq: Every day | INTRAMUSCULAR | Status: DC
  Administered 2017-06-12 – 2017-06-14 (×3): 40 mg via INTRAVENOUS
  Filled 2017-06-11 (×2): qty 4

## 2017-06-11 NOTE — Progress Notes (Signed)
  Speech Language Pathology Treatment: Dysphagia  Patient Details Name: Kelli Velasquez MRN: [id number] DOB: [birth year] Today's Date: 06/11/2017 Time: 4098-1191 SLP Time Calculation (min) (ACUTE ONLY): 22 min  Assessment / Plan / Recommendation Clinical Impression  Pt remains grossly aphonic today, although she often is mouthing words and not making attempts to produce airflow/phonation. Max cues were provided for vocalizations and coughs, with pt only intermittently showing signs of glottal approximation. SLP removed mild-moderate amounts of dried secretions adhered to her hard palate and RN reports reduced management of oral secretions. SLP provided very small amounts of ice in an attempt to clear audible secretions from her pharynx, although pt could not generate a productive cough. Recommend to remain NPO as she is still at a high risk for aspiration and with limited reserves to be able to clear her airway and/or tolerate airway invasion. She may benefit from EMST/IMST to facilitate strength of cough/swallow. Prognosis for return to POs is likely good, but it may take a while for her to get there.   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 She had significant respiratory distress in ER and failed trial bipap requiring intubation 10/31-11/4. FEES 11/6 showed severe edema and weakness, standing secretions, and diffuse residue that could not be cleared. Recommendations at that time were to remain NPO except for a few ice chips given after oral care. Pt was reintubated 11/7-11/12 and stayed on BiPAP post-extubation. SLP was consulted to reassess swallowing.      SLP Plan  Continue with current plan of care       Recommendations  Diet recommendations: NPO Medication Administration: Via alternative means                Oral Care Recommendations: Oral  care QID Follow up Recommendations: Skilled Nursing facility SLP Visit Diagnosis: Dysphagia, oropharyngeal phase (R13.12) Plan: Continue with current plan of care       GO                [name] 06/11/2017, 9:28 AM  [name], M.A. CCC-SLP [phone]

## 2017-06-11 NOTE — Clinical Social Work Note (Signed)
Clinical Social Work Assessment  Patient Details  Name: Kelli Velasquez MRN: [id number] Date of Birth: 1941/04/13  Date of referral:  06/11/17               Reason for consult:  Facility Placement                Permission sought to share information with:  Family Supports Permission granted to share information::  Yes, Verbal Permission Granted  Name::     John Fukuda(pt's husband. )  Agency::     Relationship::     Contact Information:     Housing/Transportation Living arrangements for the past 2 months:  Single Family Home(with husband. ) Source of Information:  Spouse Patient Interpreter Needed:  None Criminal Activity/Legal Involvement Pertinent to Current Situation/Hospitalization:  No - Comment as needed Significant Relationships:  Adult Children, Spouse, Other Family Members Lives with:  Spouse Do you feel safe going back to the place where you live?  Yes Need for family participation in patient care:  Yes (Comment)  Care giving concerns:  CSW went to speak with pt at bedside. Pt was only able to move mouth for words making it challenging for CSW to assess. CSW spoke with pt's husband. At this time husband has presented no concerns to CSW.    Social Worker assessment / plan:  CSW spoke with pt's husband John via phone. During this time CSW was made aware that is from home with husband. Pt and husband have a total of 7 children (all from separate marriages). Husband reports that they are able to be as involved in pt's care as they all live in [place] or [place]. Pt has support from husband at this time. Pt's husband is agreeable to SNF.   Employment status:  Retired Nurse, adult PT Recommendations:  Research officer, trade union, [employer] / Referral to community resources:  [place]  Patient/Family's Response to care:  At this time family appeared to be understanding and agreeable to plan of  care.  Patient/Family's Understanding of and Emotional Response to Diagnosis, Current Treatment, and Prognosis:  No further questions or concerns have been presented to CSW at this time.   Emotional Assessment Appearance:  Appears stated age Attitude/Demeanor/Rapport:    Affect (typically observed):  Pleasant Orientation:  Oriented to Self, Oriented to Place, Oriented to  Time Alcohol / Substance use:    Psych involvement (Current and /or in the community):  No (Comment)  Discharge Needs  Concerns to be addressed:  No discharge needs identified Readmission within the last 30 days:  No Current discharge risk:  None Barriers to Discharge:  No Barriers Identified   Wetzel Bjornstad, [place] 06/11/2017, 7:50 AM

## 2017-06-11 NOTE — Progress Notes (Signed)
Placed patient on NIV at this time with documented settings, patient tolerating well at this time, RCP will continue to monitor.

## 2017-06-11 NOTE — Progress Notes (Signed)
PULMONARY / CRITICAL CARE MEDICINE   Name: Kelli Velasquez MRN: [id number] DOB: 1940-08-13    ADMISSION DATE:  05/26/2017  REFERRING MD:  Dr. Wyvonnia Dusky  CHIEF COMPLAINT:  SOB  HISTORY OF PRESENT ILLNESS:   76 yo female former smoker presented with progressive dyspnea, cough, wheeze, and edema.  Required intubation for respiratory failure from CHF, A fib and MSSA PNA. 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 Melvyn Novas).  SUBJECTIVE:  Slept better last night.  Denies chest or abdominal pain.  VITAL SIGNS: BP (!) 152/50   Pulse (!) 106   Temp 98.5 F (36.9 C) (Oral)   Resp (!) 21   Ht _0  (1.626 m)   Wt 237 lb 10.5 oz (107.8 kg)   LMP 07/27/1980   SpO2 100%   BMI 40.79 kg/m   INTAKE / OUTPUT: I/O last 3 completed shifts: In: 3045 [I.V.:900; NG/GT:2045; IV Piggyback:100] Out: 3875 [Urine:3875]  PHYSICAL EXAMINATION:  General - pleasant, moon facies Eyes - pupils reactive ENT - raspy voice Cardiac - irregular, no murmur Chest - no wheeze, rales Abd - soft, non tender Ext - 1+ edema Skin - no rashes Neuro - follows commands   LABS:  BMET Recent Labs  Lab 06/09/17 0507 06/10/17 0500 06/11/17 0449  NA 148* 148* 151*  K 3.5 3.4* 3.7  CL 109 106 107  CO2 32 34* 37*  BUN 42* 33* 33*  CREATININE 0.97 0.84 0.79  GLUCOSE 151* 220* 122*    Electrolytes Recent Labs  Lab 06/06/17 0504 06/06/17 0505 06/07/17 0514 06/08/17 0423 06/09/17 0507 06/10/17 0500 06/11/17 0449  CALCIUM  --  8.5* 8.6* 9.3 9.4 9.0 9.1  MG 2.0  --  2.1 2.4  --   --   --   PHOS  --  3.3 2.6 2.9  --   --   --     CBC Recent Labs  Lab 06/09/17 0507 06/10/17 0500 06/11/17 0449  WBC 22.9* 21.5* 18.1*  HGB 12.8 12.9 12.1  HCT 43.5 44.4 42.1  PLT 116* 98* 90*    Liver Enzymes Recent Labs  Lab 06/06/17 0505 06/07/17 0514 06/08/17 0423  ALBUMIN 2.3* 2.2* 2.7*    Glucose Recent Labs  Lab 06/10/17 1132 06/10/17 1519 06/10/17 2003  06/10/17 2353 06/11/17 0344 06/11/17 0737  GLUCAP 175* 191* 142* 135* 158* 132*    Imaging No results found.  STUDIES:  Echo 10/31>>> EF 35-40%, diffuse hypokinesis CT chest 11/7 read pending > RLL consolidation, no appreciable effusion    CULTURES: BAL AFB 11/07 >> BAL PCP 11/07 >> negative BAL fungal 11/07 >>  BAL 11/07 >> oral flora Sputum 11/07 >> MSSA Blood 11/07 >> negative  ANTIBIOTICS: Tressie Ellis 11/05 >> 11/10 Vancomycin 11/05 >> 11/07 Rocephin 11/10 >> 11/16  SIGNIFICANT EVENTS: 10/31 Admit 11/04 extubate 11/05 transfer SDU 11/07 transfer ICU reintubated. 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  ASSESSMENT / PLAN:  Acute hypoxic respiratory failure. - oxygen to keep SpO2 90 to 95%  Presumed sleep disordered breathing. - Bipap qhs and prn during the day  MSSA PNA. - d/c abx after dose on 11/16  Hx of BOOP. - change prednisone to 15 mg daily on 11/16  Acute on chronic combined CHF. A fib with RVR. CAD, HLD. - per cardiology - defer addition of anticoagulation to cardiology and primary team  Hypernatremia. - f/u BMET - add free water  Dysphagia. - f/u with speech therapy  Thrombocytopenia. - SRA negative - f/u CBC  DM type II. - per primary team  DVT prophylaxis - SCDs SUP - protonix Nutrition - tube feeds Goals of care - DNR/DNI  PCCM will f/u on 11/19 >> call if help needed sooner.  Updated pt's husband at bedside.  Chesley Mires, MD [facility] Pulmonary/Critical Care 06/11/2017, 9:02 AM Pager:  [phone] After 3pm call: [phone]

## 2017-06-11 NOTE — Progress Notes (Signed)
  No major changes.   CVP not elevated, can decrease Lasix to once daily.   Completes tx for MSSA PNA tomorrow.   We will followup again on Monday to assess progress, possible cath next week.   Loralie Champagne 06/11/2017

## 2017-06-11 NOTE — NC FL2 (Signed)
[place] LEVEL OF CARE SCREENING TOOL     IDENTIFICATION  Patient Name: Kelli Velasquez Birthdate: [birth year] Sex: female Admission Date (Current Location): 05/26/2017  [facility] and [place] Number:  Herbalist and Address:  [facility], [place] [address] [place], [place] [address]      Provider Number: [id number]  Attending Physician Name and Address:  Lavina Hamman, MD  Relative Name and Phone Number:       Current Level of Care: Hospital Recommended Level of Care: [place] Prior Approval Number:    Date Approved/Denied:   PASRR Number:   [id number]   Discharge Plan: SNF    Current Diagnoses: Patient Active Problem List   Diagnosis Date Noted  . Acute respiratory failure with hypercapnia ([place])   . COPD exacerbation ([place])   . Acute on chronic systolic congestive heart failure ([place])   . Encounter for screening mammogram for breast cancer 02/02/2017  . Chronic combined systolic and diastolic CHF, NYHA class 2 ([place]) 02/02/2017  . Depressed mood 12/02/2016  . Routine general medical examination at a health care facility 12/30/2015  . Supraumbilical hernia 43/15/4008  . Lump in the abdomen 04/10/2015  . GERD (gastroesophageal reflux disease) 10/10/2014  . B12 deficiency 10/09/2014  . Skin lesions 10/09/2014  . Cataracts, bilateral 10/09/2014  . Dry eyes 10/09/2014  . Fatigue 05/08/2014  . Pedal edema 05/08/2014  . Shoulder tendonitis 01/24/2014  . Osteopenia 11/28/2013  . Estrogen deficiency 11/02/2013  . Dyspnea on exertion 06/27/2013  . Hernia, incisional 04/03/2013  . 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 Cough 12/18/2012  . Chronic respiratory failure with hypoxia and hypercapnia ([place]) 10/26/2012  . Special screening for malignant neoplasms, colon 06/16/2011  . DYSPHONIA 10/10/2010  . HYPERGLYCEMIA 05/27/2010  . CAD, NATIVE VESSEL 08/19/2009  . MYOCARDIAL INFARCTION, SUBENDOCARDIAL,  INITIAL EPISODE 08/07/2009  . Severe obesity (BMI >= 40) ([place]) 09/01/2007  . MYOPIA 03/03/2007  . Hyperlipidemia 10/15/2006  . Essential hypertension 10/15/2006  . Allergic rhinitis 10/15/2006  . Cough variant asthma vs UACS  10/15/2006  . OVERACTIVE BLADDER 10/15/2006  . OSTEOARTHRITIS 10/15/2006  . Urinary incontinence 10/15/2006    Orientation RESPIRATION BLADDER Height & Weight     Self, Time, Place  O2(nasal cannula 6L.) Continent Weight: 237 lb 10.5 oz (107.8 kg) Height:  5' 4"  (162.6 cm)  BEHAVIORAL SYMPTOMS/MOOD NEUROLOGICAL BOWEL NUTRITION STATUS      Incontinent Diet(please see discharge summary. )  AMBULATORY STATUS COMMUNICATION OF NEEDS Skin   Limited Assist Verbally Skin abrasions(on right buttocks. (scratch). )                       Personal Care Assistance Level of Assistance  Bathing, Feeding, Dressing Bathing Assistance: Maximum assistance Feeding assistance: Limited assistance Dressing Assistance: Maximum assistance     Functional Limitations Info  Sight, Hearing, Speech Sight Info: Adequate Hearing Info: Adequate Speech Info: Impaired    SPECIAL CARE FACTORS FREQUENCY  PT (By licensed PT), OT (By licensed OT), Speech therapy     PT Frequency: 5 times a week  OT Frequency: 5 times a week      Speech Therapy Frequency: 5 times a week       Contractures Contractures Info: Not present    Additional Factors Info  Allergies, Code Status Code Status Info: DNR Allergies Info: Fish Allergy, Shellfish Allergy, Aspirin, Atorvastatin, Crestor Rosuvastatin Calcium, Penicillins, Simvastatin, Trandolapril  Current Medications (06/11/2017):  This is the current hospital active medication list Current Facility-Administered Medications  Medication Dose Route Frequency Provider Last Rate Last Dose  . 0.9 %  sodium chloride infusion  250 mL Intravenous PRN Darlina Sicilian A, NP 10 mL/hr at 06/11/17 0600 250 mL at 06/11/17 0600  .  albuterol (PROVENTIL) (2.5 MG/3ML) 0.083% nebulizer solution 2.5 mg  2.5 mg Nebulization Q2H PRN Darlina Sicilian A, NP   2.5 mg at 06/01/17 1243  . aspirin chewable tablet 81 mg  81 mg Per Tube Daily Chesley Mires, MD      . cefTRIAXone (ROCEPHIN) 1 g in dextrose 5 % 50 mL IVPB  1 g Intravenous Q24H Sela Hilding, MD   Stopped at 06/10/17 2211  . chlorhexidine gluconate (MEDLINE KIT) (PERIDEX) 0.12 % solution 15 mL  15 mL Mouth Rinse BID Chesley Mires, MD   15 mL at 06/10/17 2037  . Chlorhexidine Gluconate Cloth 2 % PADS 6 each  6 each Topical Q0600 Javier Glazier, MD   6 each at 06/10/17 718 140 3610  . ezetimibe (ZETIA) tablet 10 mg  10 mg Per Tube QHS Javier Glazier, MD   10 mg at 06/10/17 2148  . feeding supplement (JEVITY 1.2 CAL) liquid 1,000 mL  1,000 mL Per Tube Continuous Chesley Mires, MD 55 mL/hr at 06/11/17 0600 1,000 mL at 06/11/17 0600  . feeding supplement (PRO-STAT SUGAR FREE 64) liquid 30 mL  30 mL Per Tube TID Chesley Mires, MD   30 mL at 06/10/17 2148  . furosemide (LASIX) injection 40 mg  40 mg Intravenous BID Chesley Mires, MD   40 mg at 06/10/17 1744  . hydrALAZINE (APRESOLINE) tablet 50 mg  50 mg Per Tube Q8H Larey Dresser, MD       Or  . hydrALAZINE (APRESOLINE) injection 10 mg  10 mg Intravenous Q8H Larey Dresser, MD   10 mg at 06/11/17 0503  . hydrALAZINE (APRESOLINE) injection 10-40 mg  10-40 mg Intravenous Q4H PRN Anders Simmonds, MD   20 mg at 06/07/17 2021  . insulin aspart (novoLOG) injection 0-20 Units  0-20 Units Subcutaneous Q4H Javier Glazier, MD   4 Units at 06/11/17 0448  . insulin glargine (LANTUS) injection 20 Units  20 Units Subcutaneous Daily Sela Hilding, MD   20 Units at 06/10/17 [id number]  . ipratropium-albuterol (DUONEB) 0.5-2.5 (3) MG/3ML nebulizer solution 3 mL  3 mL Nebulization Q6H Rizwan, Saima, MD   3 mL at 06/11/17 0744  . isosorbide dinitrate (ISORDIL) tablet 20 mg  20 mg Per Tube TID Larey Dresser, MD   20 mg at 06/10/17 2148   . MEDLINE mouth rinse  15 mL Mouth Rinse QID Chesley Mires, MD   15 mL at 06/11/17 0447  . metoprolol tartrate (LOPRESSOR) injection 2.5 mg  2.5 mg Intravenous Q3H PRN Anders Simmonds, MD   2.5 mg at 06/10/17 0025  . montelukast (SINGULAIR) tablet 10 mg  10 mg Per Tube QHS Javier Glazier, MD   10 mg at 06/10/17 2147  . pantoprazole sodium (PROTONIX) 40 mg/20 mL oral suspension 40 mg  40 mg Per Tube Daily Sela Hilding, MD   40 mg at 06/10/17 5366  . predniSONE (DELTASONE) tablet 20 mg  20 mg Per Tube Q breakfast Javier Glazier, MD   20 mg at 06/10/17 0854  . sodium chloride flush (NS) 0.9 % injection 10-40 mL  10-40 mL Intracatheter PRN Javier Glazier, MD   10  mL at 06/03/17 1151  . sodium chloride flush (NS) 0.9 % injection 10-40 mL  10-40 mL Intracatheter Q12H Javier Glazier, MD   10 mL at 06/10/17 2149  . spironolactone (ALDACTONE) tablet 12.5 mg  12.5 mg Per Tube Daily Chesley Mires, MD         Discharge Medications: Please see discharge summary for a list of discharge medications.  Relevant Imaging Results:  Relevant Lab Results:   Additional Information [id number]  Wetzel Bjornstad, LCSWA

## 2017-06-11 NOTE — Progress Notes (Signed)
[facility] Progress Note  Patient: Kelli Velasquez QGB:[id number]   PCP: Abner Greenspan, MD DOB: 1941-02-16   DOA: 05/26/2017   DOS: 06/11/2017   Date of Service: the patient was seen and examined on 06/11/2017  Subjective: Denies any acute complaint, follows commands, able to verbalize very muffled voice.  No pain no abdominal pain.  No nausea no vomiting.  Brief hospital course: Pt. with PMH of CHF, A. fib, CAD, HTN, BOOP; admitted on 05/26/2017, presented with complaint of shortness of breath, was found to have MSSA pneumonia with acute respiratory failure.  Patient was admitted in the ICU with intubation and extubated on 05/30/2017.  Followed by patient had another episode of respiratory distress requiring repeat intubation on 06/02/2017.  Currently extubated on 06/07/2017 and transferred to stepdown unit. After repeat extubation patient remains aphonic. Bronchoscopy performed on 06/02/2017, cultures so far are negative.  Sputum culture is growing MSSA patient completed antibiotic course on 06/11/2017. Also found to have acute systolic CHF, cardiology consulted and currently on IV Lasix recommend inpatient cardiac cath. Currently further plan is continue monitoring for improvement in voice quality as well as further cardiac workup.  Assessment and Plan: 1.  Acute hypoxic respiratory failure. Multifactorial. Currently on nasal cannula. Tolerating it better. Continue respiratory care. CCM following.  2.  Presumed sleep disordered breathing. BiPAP nightly for now will probably continue on discharge. Follow-up on pulmonary recommendation for the same.  3.  MSSA pneumonia. Antibiotic course completed. Monitor.  4.  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 Dose changed to 15 mg daily starting today.  5.  Acute on chronic combined CHF. A. fib with RVR. CAD and HLD. Cardiology consulted. Continued on IV Lasix. CVP monitor. Cardiology considering right and left heart  cath. Echocardiogram shows decreased EF of 35%. Continue to monitor on the telemetry.  6.  Dysphagia. Aphonia. Speech therapy following. Likely injury from intubation. Monitor. May require bedside swallowing as well as F EES   7.  Type 2 diabetes mellitus. Continue sliding scale insulin.  8.  Protein calorie malnutrition in the acute setting due to poor p.o. intake. Continue tube feeding for now.   Diet: On tube feeding via cor trac DVT Prophylaxis: subcutaneous Heparin  Advance goals of care discussion: DNR DNI  Family Communication: family was present at bedside, at the time of interview. The pt provided permission to discuss medical plan with the family. Opportunity was given to ask question and all questions were answered satisfactorily.   Disposition:  Discharge to be determined.   Consultants: CCM, cardiology Procedures: Echocardiogram, intubation , bronchoscopy  Antibiotics: Anti-infectives (From admission, onward)   Start     Dose/Rate Route Frequency Ordered Stop   06/05/17 2200  cefTRIAXone (ROCEPHIN) 1 g in dextrose 5 % 50 mL IVPB     1 g 100 mL/hr over 30 Minutes Intravenous Every 24 hours 06/05/17 1442 06/11/17 2359   06/03/17 0400  vancomycin (VANCOCIN) 1,250 mg in sodium chloride 0.9 % 250 mL IVPB  Status:  Discontinued     1,250 mg 166.7 mL/hr over 90 Minutes Intravenous Every 24 hours 06/02/17 0343 06/03/17 0919   06/02/17 0345  cefTAZidime (FORTAZ) 2 g in dextrose 5 % 50 mL IVPB  Status:  Discontinued     2 g 100 mL/hr over 30 Minutes Intravenous Every 12 hours 06/02/17 0343 06/05/17 1442   06/02/17 0345  vancomycin (VANCOCIN) 2,000 mg in sodium chloride 0.9 % 500 mL IVPB     2,000 mg 250 mL/hr  over 120 Minutes Intravenous  Once 06/02/17 0343 06/02/17 0752   05/28/17 2000  fluconazole (DIFLUCAN) IVPB 100 mg     100 mg 50 mL/hr over 60 Minutes Intravenous  Once 05/28/17 1933 05/28/17 2119       Objective: Physical Exam: Vitals:   06/11/17 0900  06/11/17 1000 06/11/17 1200 06/11/17 1423  BP: (!) 135/55 (!) 141/90 136/81 136/81  Pulse: 65 (!) 113 (!) 105 (!) 111  Resp: (!) 32 (!) 27 (!) 35 (!) 26  Temp:      TempSrc:      SpO2: 100% 100% 100% 98%  Weight:      Height:        Intake/Output Summary (Last 24 hours) at 06/11/2017 1615 Last data filed at 06/11/2017 1400 Gross per 24 hour  Intake 1730 ml  Output 2275 ml  Net -545 ml   Filed Weights   06/09/17 0458 06/10/17 0500 06/11/17 0500  Weight: 110.4 kg (243 lb 6.2 oz) 108.4 kg (238 lb 15.7 oz) 107.8 kg (237 lb 10.5 oz)   General: Alert, Awake and Oriented to Person. Appear in moderate distress, affect appropriate Eyes: PERRL, Conjunctiva normal ENT: Oral Mucosa clear moist. Neck: no JVD, no Abnormal Mass Or lumps Cardiovascular: S1 and S2 Present, no Murmur, Peripheral Pulses Present Respiratory: increased respiratory effort, Bilateral Air entry equal and Decreased, no use of accessory muscle, bilateral Crackles, Occasional wheezes Abdomen: Bowel Sound present, Soft and no tenderness, no hernia Skin: no redness, no Rash, no induration Extremities: no Pedal edema, no calf tenderness Neurologic: Grossly no focal neuro deficit. Bilaterally Equal motor strength  Data Reviewed: CBC: Recent Labs  Lab 06/05/17 0404 06/06/17 0504 06/07/17 0514 06/08/17 0423 06/09/17 0507 06/10/17 0500 06/11/17 0449  WBC 13.8* 16.6* 18.7* 32.4* 22.9* 21.5* 18.1*  NEUTROABS 12.3* 15.0* 17.4* 29.8*  --   --   --   HGB 12.0 11.2* 10.8* 13.0 12.8 12.9 12.1  HCT 41.6 38.6 37.1 44.8 43.5 44.4 42.1  MCV 100.5* 102.4* 102.8* 102.3* 101.2* 100.9* 103.4*  PLT 130* 102* 95* 140* 116* 98* 90*   Basic Metabolic Panel: Recent Labs  Lab 06/05/17 0404 06/06/17 0504 06/06/17 0505 06/07/17 0514 06/08/17 0423 06/09/17 0507 06/10/17 0500 06/11/17 0449  NA 151*  --  151* 150* 150* 148* 148* 151*  K 3.6  --  3.5 3.2* 3.8 3.5 3.4* 3.7  CL 111  --  113* 112* 112* 109 106 107  CO2 32  --  32  31 31 32 34* 37*  GLUCOSE 241*  --  253* 271* 94 151* 220* 122*  BUN 55*  --  45* 44* 33* 42* 33* 33*  CREATININE 1.06*  --  0.88 0.78 0.71 0.97 0.84 0.79  CALCIUM 9.2  --  8.5* 8.6* 9.3 9.4 9.0 9.1  MG 2.3 2.0  --  2.1 2.4  --   --   --   PHOS 3.0  --  3.3 2.6 2.9  --   --   --     Liver Function Tests: Recent Labs  Lab 06/05/17 0404 06/06/17 0505 06/07/17 0514 06/08/17 0423  ALBUMIN 2.5* 2.3* 2.2* 2.7*   No results for input(s): LIPASE, AMYLASE in the last 168 hours. No results for input(s): AMMONIA in the last 168 hours. Coagulation Profile: No results for input(s): INR, PROTIME in the last 168 hours. Cardiac Enzymes: No results for input(s): CKTOTAL, CKMB, CKMBINDEX, TROPONINI in the last 168 hours. BNP (last 3 results) Recent Labs  01/11/17 1053  PROBNP 264.0*   CBG: Recent Labs  Lab 06/10/17 2003 06/10/17 2353 06/11/17 0344 06/11/17 0737 06/11/17 1142  GLUCAP 142* 135* 158* 132* 200*   Studies: No results found.  Scheduled Meds: . aspirin  81 mg Per Tube Daily  . chlorhexidine gluconate (MEDLINE KIT)  15 mL Mouth Rinse BID  . Chlorhexidine Gluconate Cloth  6 each Topical Q0600  . ezetimibe  10 mg Per Tube QHS  . feeding supplement (PRO-STAT SUGAR FREE 64)  30 mL Per Tube TID  . free water  100 mL Per Tube Q8H  . [START ON 06/12/2017] furosemide  40 mg Intravenous Daily  . hydrALAZINE  50 mg Per Tube Q8H   Or  . hydrALAZINE  10 mg Intravenous Q8H  . insulin aspart  0-20 Units Subcutaneous Q4H  . insulin glargine  20 Units Subcutaneous Daily  . ipratropium-albuterol  3 mL Nebulization Q6H  . isosorbide dinitrate  20 mg Per Tube TID  . mouth rinse  15 mL Mouth Rinse QID  . montelukast  10 mg Per Tube QHS  . pantoprazole sodium  40 mg Per Tube Daily  . predniSONE  20 mg Per Tube Q breakfast  . sodium chloride flush  10-40 mL Intracatheter Q12H  . spironolactone  12.5 mg Per Tube Daily   Continuous Infusions: . sodium chloride 250 mL (06/11/17 1400)   . cefTRIAXone (ROCEPHIN)  IV Stopped (06/10/17 2211)  . feeding supplement (JEVITY 1.2 CAL) 1,000 mL (06/11/17 1400)   PRN Meds: sodium chloride, albuterol, hydrALAZINE, metoprolol tartrate, sodium chloride flush  Time spent: 35 minutes  Author: Berle Mull, MD [facility] Pager: [phone] 06/11/2017 4:15 PM  If 7PM-7AM, please contact night-coverage at [email or web address], password [facility]

## 2017-06-11 NOTE — Progress Notes (Signed)
Called by bedside RN to assess patient for mental status change. Per RN, in nursing report, she was told that patient was AxO=4 and talking, for RN, patient has been obtunded. RT placed the patient on BIPAP, patient has an order for PRN nightly BIPAP.  I asked the RN to page [facility] service and inform them of the situation and to obtain a ABG, I was with another emergency. Patient is a DNR and VS were stable, HR has been in the low 100s and sats maintained. Upon arrival at 2105, not in acute respiratory distress, patient did withdraw from pain in 4 extremities, pupils 3 bilaterally and sluggish. + Gag and CR.  I paged [place] MD at  [phone], MD immediately paged me back, I update him on the patient's condition, we reviewed the ABG (CO2 58.6) but compensated, I administered 0.4mg  of Narcan, blood sugar was 204.  MD ordered STAT HEAD CT, patient taken to CT on BIPAP, RN paged [facility] MD, I did speak with MD, no change in mental status, Neurology called and came to bedside, ordered labs and MRI STAT.   Plan:  -- labs, MRI, repeat ABG at 130 for follow up.   Call RRT if needed   Start Time 2105 End Time 2300

## 2017-06-11 NOTE — Progress Notes (Signed)
Transported patient to CT and back on NIV, patient tolerated well, RCP placed on NIV with documented settings.

## 2017-06-11 NOTE — Consult Note (Addendum)
Neurology Consultation  Reason for Consult: Altered mental status Referring Physician: Dr. Jani Gravel  CC: Unresponsiveness  History is obtained from: Chart, patient's nurse at the bedside  HPI: Kelli Velasquez is a 76 y.o. 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 According to the nurse at bedside, she was told to report that the patient was alert awake oriented x4 but on her assessment of the patient at the bedside, the patient was not waking up to voice or noxious edematous.  She would barely moved to noxious stimulation.  She called the primary hospitalist who ordered a noncontrast CT of the head and an ABG.  Her CO2 was elevated.  Noncontrast CT of the head showed possible asymmetric hypodensity in the left cerebellum prompting a neurology consult for the possible stroke and altered mental status. Patient's level of consciousness during the day was unclear as no one has seen her awake.  The nurse that is coming for the night shift was told the patient was awake alert but on her initial assessment for the night, the patient was completely unresponsive. Patient is currently on BiPAP.  She is a DNR.  ROS: Unable to obtain due to altered mental status.   Past Medical History:  Diagnosis Date  . Allergy    allergic rhinitis  . Asthma   . Chronic bronchitis ([place])   . Coronary artery disease    cath January 2011 with DEs LAD and RCA  . Dyspnea   . Full dentures   . Gallstones   . GERD (gastroesophageal reflux disease)   . History of echocardiogram    Echo 5/17: mod LVH, EF 50-55%, ant-septal HK, Gr 1 DD, mod LAE //  b.  Echo 7/17: mild concentric LVH, EF 45-50%, inf-lat, inf, inf-septal HK, mild LAE  .  History of nuclear stress test    Myoview 7/17: EF 48%, small mild apical defect, no ischemia, low risk  . Hyperlipidemia   . Hypertension   . 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 Myopia   . 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 Nocturnal oxygen desaturation    o2 at night  . Obesity   . Osteoarthritis    knees, fingers, shoulders  . Overactive bladder   . Oxygen deficiency    uses oxygen all day  . Rash    and other non specific skin eruptions  . Retaining fluid    in ankles and feet  . Urinary incontinence   . Vertigo   . Wears glasses     Family History  Problem Relation Age of Onset  . Stroke Mother   . Diabetes Mother 45  . Stroke Father   . Heart failure Father   . Breast cancer Maternal Grandmother   . Colon cancer Neg Hx     Social History:   reports that she quit smoking about 34 years ago. Her smoking use included cigarettes. She has a 25.00 pack-year smoking history. she has never used smokeless tobacco. She reports that she drinks alcohol. She reports that she does not use drugs.  Medications  Current Facility-Administered Medications:  .  0.9 %  sodium chloride infusion, 250 mL, Intravenous, PRN, Darlina Sicilian  A, NP, Last Rate: 10 mL/hr at 06/11/17 1400, 250 mL at 06/11/17 1400 .  albuterol (PROVENTIL) (2.5 MG/3ML) 0.083% nebulizer solution 2.5 mg, 2.5 mg, Nebulization, Q2H PRN, Whiteheart, Kathryn A, NP, 2.5 mg at 06/01/17 1243 .  aspirin chewable tablet 81 mg, 81 mg, Per Tube, Daily, Chesley Mires, MD, 81 mg at 06/11/17 1114 .  cefTRIAXone (ROCEPHIN) 1 g in dextrose 5 % 50 mL IVPB, 1 g, Intravenous, Q24H, Sela Hilding, MD, Stopped at 06/10/17 2211 .  chlorhexidine gluconate (MEDLINE KIT) (PERIDEX) 0.12 % solution 15 mL, 15 mL, Mouth Rinse, BID, Sood, Vineet, MD, 15 mL at 06/11/17 2040 .  Chlorhexidine Gluconate Cloth 2 % PADS 6 each, 6 each, Topical, Q0600, Javier Glazier, MD, 6 each at 06/10/17 [phone] .  ezetimibe (ZETIA) tablet 10 mg, 10 mg, Per Tube, QHS, Javier Glazier, MD, 10 mg at 06/11/17 2231 .  feeding supplement (JEVITY 1.2 CAL) liquid 1,000 mL, 1,000 mL, Per Tube, Continuous, Sood, Vineet, MD, Last Rate: 55 mL/hr at 06/11/17 1400, 1,000 mL at 06/11/17 1400 .  feeding supplement (PRO-STAT SUGAR FREE 64) liquid 30 mL, 30 mL, Per Tube, TID, Chesley Mires, MD, 30 mL at 06/11/17 2232 .  free water 100 mL, 100 mL, Per Tube, Q8H, Sood, Vineet, MD, 100 mL at 06/11/17 2200 .  [START ON 06/12/2017] furosemide (LASIX) injection 40 mg, 40 mg, Intravenous, Daily, Larey Dresser, MD .  hydrALAZINE (APRESOLINE) tablet 50 mg, 50 mg, Per Tube, Q8H, 50 mg at 06/11/17 2232 **OR** hydrALAZINE (APRESOLINE) injection 10 mg, 10 mg, Intravenous, Q8H, Larey Dresser, MD, 10 mg at 06/11/17 1526 .  hydrALAZINE (APRESOLINE) injection 10-40 mg, 10-40 mg, Intravenous, Q4H PRN, Anders Simmonds, MD, 20 mg at 06/07/17 2021 .  insulin aspart (novoLOG) injection 0-20 Units, 0-20 Units, Subcutaneous, Q4H, Javier Glazier, MD, 7 Units at 06/11/17 2036 .  insulin glargine (LANTUS) injection 20 Units, 20 Units, Subcutaneous, Daily, Sela Hilding, MD, 20 Units at 06/11/17 1053 .  ipratropium-albuterol (DUONEB) 0.5-2.5 (3) MG/3ML nebulizer solution 3 mL, 3 mL, Nebulization, Q6H, Rizwan, Saima, MD, 3 mL at 06/11/17 2008 .  isosorbide dinitrate (ISORDIL) tablet 20 mg, 20 mg, Per Tube, TID, Larey Dresser, MD, 20 mg at 06/11/17 2232 .  MEDLINE mouth rinse, 15 mL, Mouth Rinse, QID, Halford Chessman, Vineet, MD, 15 mL at 06/11/17 0447 .  metoprolol tartrate (LOPRESSOR) injection 2.5 mg, 2.5 mg, Intravenous, Q3H PRN, Anders Simmonds, MD, 2.5 mg at 06/11/17 1526 .  montelukast (SINGULAIR) tablet 10 mg, 10 mg, Per Tube, QHS, Javier Glazier, MD, 10 mg at 06/11/17 2232 .  pantoprazole sodium (PROTONIX) 40 mg/20 mL oral suspension 40 mg, 40 mg, Per Tube, Daily, Sela Hilding, MD, 40  mg at 06/11/17 1051 .  predniSONE (DELTASONE) tablet 20 mg, 20 mg, Per Tube, Q breakfast, Javier Glazier, MD, 20 mg at 06/11/17 1059 .  sodium chloride flush (NS) 0.9 % injection 10-40 mL, 10-40 mL, Intracatheter, PRN, Javier Glazier, MD, 10 mL at 06/03/17 1151 .  sodium chloride flush (NS) 0.9 % injection 10-40 mL, 10-40 mL, Intracatheter, Q12H, Javier Glazier, MD, 10 mL at 06/11/17 2233 .  spironolactone (ALDACTONE) tablet 12.5 mg, 12.5 mg, Per Tube, Daily, Chesley Mires, MD, 12.5 mg at 06/11/17 1051   Exam: Current vital signs: BP 130/67   Pulse (!) 105   Temp 98.7 F (37.1 C) (Axillary) Comment (Src): pt. sleeping  Resp (!) 26   Ht 5' 4"  (1.626 m)  Wt 107.8 kg (237 lb 10.5 oz)   LMP 07/27/1980   SpO2 100%   BMI 40.79 kg/m  Vital signs in last 24 hours: Temp:  [98.5 F (36.9 C)-98.7 F (37.1 C)] 98.7 F (37.1 C) (11/16 1945) Pulse Rate:  [41-115] 105 (11/16 2100) Resp:  [21-35] 26 (11/16 2100) BP: (104-165)/(46-91) 130/67 (11/16 2231) SpO2:  [98 %-100 %] 100 % (11/16 2100) Weight:  [107.8 kg (237 lb 10.5 oz)] 107.8 kg (237 lb 10.5 oz) (11/16 0500) General: Morbidly obese woman, sitting reclined in bed with BiPAP on her mouth. HEENT: Normocephalic, atraumatic, dry oral mucous membranes Lungs: Distant breath sounds, wheezing and scattered rales all over Cardiovascular: S1-S2 heard, regular rate rhythm Abdomen: Obese, bowel sounds present Extremities: 2+ pedal edema Neurological exam Mental status: Patient is obtunded, does not open eyes to voice or noxious stimulus. Speech cannot be assessed for the above said reason. Cranial nerves: Pupils equal round reactive to light, no gaze deviation, unable to assess for visual field cuts, no facial asymmetry. Motor exam: No spontaneous movement.  On sternal rub, purposefully localizes with the right upper extremity weakly localizes with the left upper extremity.  Withdraws both lower extremities vigorously to noxious  stimulus. Sensory exam: As above Coordination cannot be tested Gait cannot be tested deep tendon reflexes are mute all over   Labs I have reviewed labs in epic and the results pertinent to this consultation are: Arterial blood gas-pH 7.42, PCO2 is 58.6, PO2 92.9, bicarb 38.1. Most recent chest x-rays from yesterday that shows evidence of aspiration pneumonia CBC from today shows leukocytosis at 18.1, hyponatremia with sodium at 151, PCO2 37, BUN 33, creatinine 0.79  CBC    Component Value Date/Time   WBC 18.1 (H) 06/11/2017 0449   RBC 4.07 06/11/2017 0449   HGB 12.1 06/11/2017 0449   HCT 42.1 06/11/2017 0449   PLT 90 (L) 06/11/2017 0449   MCV 103.4 (H) 06/11/2017 0449   MCH 29.7 06/11/2017 0449   MCHC 28.7 (L) 06/11/2017 0449   RDW 16.4 (H) 06/11/2017 0449   LYMPHSABS 1.3 06/08/2017 0423   MONOABS 1.3 (H) 06/08/2017 0423   EOSABS 0.0 06/08/2017 0423   BASOSABS 0.0 06/08/2017 0423    CMP     Component Value Date/Time   NA 151 (H) 06/11/2017 0449   K 3.7 06/11/2017 0449   CL 107 06/11/2017 0449   CO2 37 (H) 06/11/2017 0449   GLUCOSE 122 (H) 06/11/2017 0449   BUN 33 (H) 06/11/2017 0449   CREATININE 0.79 06/11/2017 0449   CREATININE 1.01 (H) 12/16/2015 1142   CALCIUM 9.1 06/11/2017 0449   PROT 6.2 (L) 06/02/2017 0346   ALBUMIN 2.7 (L) 06/08/2017 0423   AST 20 06/02/2017 0346   ALT 24 06/02/2017 0346   ALKPHOS 64 06/02/2017 0346   BILITOT 1.2 06/02/2017 0346   GFRNONAA >60 06/11/2017 0449   GFRAA >60 06/11/2017 0449    Lipid Panel     Component Value Date/Time   CHOL 182 02/01/2017 0948   TRIG 243 (H) 05/29/2017 0659   HDL 55.40 02/01/2017 0948   CHOLHDL 3 02/01/2017 0948   VLDL 36.4 02/01/2017 0948   LDLCALC 90 02/01/2017 0948   LDLDIRECT 124.8 09/01/2007 0909   Imaging I have reviewed the images obtained:  CT-scan of the brain -left frontal area of encephalomalacia, generalized atrophy and possible hypodensity in the left cerebellum.  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 On my examination, she does localize to noxious stimulation strongly on the right upper extremity and weakly on the left.  Lower extremity withdrawal to noxious stimulus is symmetric. Noncontrast CT of the head showed a possible left cerebellar hypodensity, no comparison available to know whether this is old or new. Arterial blood gas suggestive of hypercarbia.  Labs show multiple derangements-leukocytosis and other electrolyte abnormalities. My examination suggests encephalopathy more than stroke. Differentials include toxic metabolic encephalopathy, CO2 narcosis, stroke and less for seizure.  Recommendations: -MRI brain without contrast when stable to evaluate for stroke -CXR -Correction of toxic metabolic derangements per primary team. -Repeat CBC BMP, methemoglobin -Optimization of her breathing status-she is a DNR, primary team has her on BiPAP-management per primary team. -If Mental status does not improve inspite of medical management - consider EEG although my suspicion for seizure is low.  -- Amie Portland, MD [facility] [phone] If 7pm to 7am, please call on call as listed on AMION.

## 2017-06-11 NOTE — Progress Notes (Signed)
Physical Therapy Treatment Patient Details Name: Kelli Velasquez MRN: [id number] DOB: 1940-10-15 Today's Date: 06/11/2017    History of Present Illness 76 yo female admitted for progressive respiratory distress.  She then developed altered mental status.  Pt has a past medical history including Allergy, Asthma, Chronic bronchitis, CAD, Dyspnea, GERD, Hyperlipidemia, Hypertension, Myocardial infarction, Myopia, Neoplasm of skin, Nocturnal oxygen desaturation, Obesity, Osteoarthritis, Oxygen deficiency,  Urinary incontinence, and Vertigo. Required intubation 10/31-11/4 and  11/6-11/14, now off Bipap.    PT Comments    Pt admitted with above diagnosis. Pt currently with functional limitations due to balance and endurance deficits. Pt was able to sit EOB with min to mod assist due to pt impulsive at times.  Attempts to stand unsuccessful therefore scoot pivot with use of pad with max assist to drop arm recliner.  VSS and pt tolerated well.  Will continue acute PT.  Pt will benefit from skilled PT to increase their independence and safety with mobility to allow discharge to the venue listed below.     Follow Up Recommendations  SNF;Supervision/Assistance - 24 hour     Equipment Recommendations  None recommended by PT;Other (comment)(defer to next venue)    Recommendations for Other Services       Precautions / Restrictions Precautions Precautions: Fall Precaution Comments: watch O2 saturations and HR Restrictions Weight Bearing Restrictions: No    Mobility  Bed Mobility Overal bed mobility: Needs Assistance Bed Mobility: Supine to Sit;Sit to Supine Rolling: Supervision   Supine to sit: Mod assist;HOB elevated;Max assist;+2 for physical assistance     General bed mobility comments: cueing for sequencing and to maintain attention to task; assist to elevate trunk and position hips at EOB  Transfers Overall transfer level: Needs assistance Equipment used: 2 person hand held  assist Transfers: Sit to/from Stand;Lateral/Scoot Transfers Sit to Stand: Max assist;Total assist;+2 physical assistance;From elevated surface        Lateral/Scoot Transfers: Max assist;+2 physical assistance;From elevated surface General transfer comment: Pt was unable to stand to feet even with +2 max assist.  Could not clear bottom off bed therefore scooted  bed to chair with use of pad to drop arm recliner.  Pt able to lean forward and unweight bottom to slide over and asssited but needed max assist and cues.    Ambulation/Gait                 Stairs            Wheelchair Mobility    Modified Rankin (Stroke Patients Only)       Balance Overall balance assessment: Needs assistance Sitting-balance support: Bilateral upper extremity supported;Feet supported Sitting balance-Leahy Scale: Poor Sitting balance - Comments: sitting EOB needing min to mod assist as she was slightly impulsive and kept leaning in all directions therefore needed min assist for safety.  Pt thought she could stand but was not successful.      Standing balance-Leahy Scale: Zero Standing balance comment: could not clear bottom off bed.                             Cognition Arousal/Alertness: Awake/alert Behavior During Therapy: WFL for tasks assessed/performed Overall Cognitive Status: Difficult to assess                                        Exercises  General Comments General comments (skin integrity, edema, etc.): Pts son stayed for session      Pertinent Vitals/Pain Pain Assessment: No/denies pain   VSS Home Living                      Prior Function            PT Goals (current goals can now be found in the care plan section) Progress towards PT goals: Progressing toward goals    Frequency    Min 3X/week      PT Plan Current plan remains appropriate    Co-evaluation              AM-PAC PT "6 Clicks" Daily  Activity  Outcome Measure  Difficulty turning over in bed (including adjusting bedclothes, sheets and blankets)?: Unable Difficulty moving from lying on back to sitting on the side of the bed? : Unable Difficulty sitting down on and standing up from a chair with arms (e.g., wheelchair, bedside commode, etc,.)?: Unable Help needed moving to and from a bed to chair (including a wheelchair)?: Total Help needed walking in hospital room?: Total Help needed climbing 3-5 steps with a railing? : Total 6 Click Score: 6    End of Session Equipment Utilized During Treatment: Oxygen;Gait belt(6L of O2 via [place]) Activity Tolerance: Patient limited by fatigue Patient left: with call bell/phone within reach;with family/visitor present;in chair Nurse Communication: Mobility status;Need for lift equipment PT Visit Diagnosis: Unsteadiness on feet (R26.81);Difficulty in walking, not elsewhere classified (R26.2)     Time: 8270-7867 PT Time Calculation (min) (ACUTE ONLY): 25 min  Charges:  $Therapeutic Activity: 23-37 mins                    G Codes:       Zoraida Havrilla,PT Acute Rehabilitation [phone] (pager)    Denice Paradise 06/11/2017, 10:25 AM

## 2017-06-12 ENCOUNTER — Inpatient Hospital Stay (HOSPITAL_COMMUNITY): Payer: Medicare Other

## 2017-06-12 DIAGNOSIS — R4182 Altered mental status, unspecified: Secondary | ICD-10-CM

## 2017-06-12 LAB — GLUCOSE, CAPILLARY
GLUCOSE-CAPILLARY: 148 mg/dL — AB (ref 65–99)
GLUCOSE-CAPILLARY: 262 mg/dL — AB (ref 65–99)
GLUCOSE-CAPILLARY: 126 mg/dL — AB (ref 65–99)
Glucose-Capillary: 99 mg/dL (ref 65–99)
Glucose-Capillary: 195 mg/dL — ABNORMAL HIGH (ref 65–99)

## 2017-06-12 LAB — CBC WITH DIFFERENTIAL/PLATELET
Basophils Absolute: 0 10*3/uL (ref 0.0–0.1)
Basophils Relative: 0 %
EOS PCT: 3 %
Eosinophils Absolute: 0.5 10*3/uL (ref 0.0–0.7)
HCT: 39.2 % (ref 36.0–46.0)
HEMOGLOBIN: 11.2 g/dL — AB (ref 12.0–15.0)
LYMPHS ABS: 1.1 10*3/uL (ref 0.7–4.0)
LYMPHS PCT: 6 %
MCH: 29.3 pg (ref 26.0–34.0)
MCHC: 28.6 g/dL — AB (ref 30.0–36.0)
MCV: 102.6 fL — AB (ref 78.0–100.0)
MONOS PCT: 4 %
Monocytes Absolute: 0.8 10*3/uL (ref 0.1–1.0)
Neutro Abs: 16.2 10*3/uL — ABNORMAL HIGH (ref 1.7–7.7)
Neutrophils Relative %: 87 %
PLATELETS: 78 10*3/uL — AB (ref 150–400)
RBC: 3.82 MIL/uL — AB (ref 3.87–5.11)
RDW: 16.3 % — ABNORMAL HIGH (ref 11.5–15.5)
WBC: 18.5 10*3/uL — AB (ref 4.0–10.5)

## 2017-06-12 LAB — BLOOD GAS, ARTERIAL
ACID-BASE EXCESS: 14 mmol/L — AB (ref 0.0–2.0)
BICARBONATE: 38.8 mmol/L — AB (ref 20.0–28.0)
DRAWN BY: 51831
Delivery systems: POSITIVE
Expiratory PAP: 6
FIO2: 60
Inspiratory PAP: 14
O2 SAT: 84.9 %
PCO2 ART: 55.3 mmHg — AB (ref 32.0–48.0)
PH ART: 7.46 — AB (ref 7.350–7.450)
PO2 ART: 50.6 mmHg — AB (ref 83.0–108.0)
Patient temperature: 98.6
RATE: 8 resp/min

## 2017-06-12 LAB — TROPONIN I
TROPONIN I: 0.07 ng/mL — AB (ref <0.03)
Troponin I: 0.07 ng/mL (ref <0.03)
Troponin I: 0.07 ng/mL (ref <0.03)

## 2017-06-12 LAB — LACTIC ACID, PLASMA: Lactic Acid, Venous: 1.2 mmol/L (ref 0.5–1.9)

## 2017-06-12 LAB — BASIC METABOLIC PANEL
Anion gap: 7 (ref 5–15)
BUN: 43 mg/dL — AB (ref 6–20)
CHLORIDE: 108 mmol/L (ref 101–111)
CO2: 38 mmol/L — ABNORMAL HIGH (ref 22–32)
Calcium: 8.9 mg/dL (ref 8.9–10.3)
Creatinine, Ser: 0.79 mg/dL (ref 0.44–1.00)
GFR calc Af Amer: >60 mL/min (ref >60)
GFR calc non Af Amer: >60 mL/min (ref >60)
GLUCOSE: 85 mg/dL (ref 65–99)
POTASSIUM: 3.6 mmol/L (ref 3.5–5.1)
Sodium: 153 mmol/L — ABNORMAL HIGH (ref 135–145)

## 2017-06-12 LAB — D-DIMER, QUANTITATIVE: D-Dimer, Quant: 2.79 ug/mL-FEU — ABNORMAL HIGH (ref 0.00–0.50)

## 2017-06-12 LAB — MAGNESIUM: Magnesium: 2.5 mg/dL — ABNORMAL HIGH (ref 1.7–2.4)

## 2017-06-12 MED ORDER — METOPROLOL TARTRATE 5 MG/5ML IV SOLN
5.0000 mg | Freq: Once | INTRAVENOUS | Status: AC
  Administered 2017-06-12: 5 mg via INTRAVENOUS

## 2017-06-12 MED ORDER — FUROSEMIDE 10 MG/ML IJ SOLN
INTRAMUSCULAR | Status: AC
  Filled 2017-06-12: qty 4

## 2017-06-12 MED ORDER — ENOXAPARIN SODIUM 120 MG/0.8ML ~~LOC~~ SOLN
1.0000 mg/kg | Freq: Once | SUBCUTANEOUS | Status: AC
  Administered 2017-06-12: 110 mg via SUBCUTANEOUS
  Filled 2017-06-12: qty 0.72

## 2017-06-12 MED ORDER — METOPROLOL TARTRATE 5 MG/5ML IV SOLN
INTRAVENOUS | Status: AC
  Filled 2017-06-12: qty 5

## 2017-06-12 MED ORDER — FREE WATER
300.0000 mL | Freq: Three times a day (TID) | Status: DC
  Administered 2017-06-12 – 2017-06-15 (×12): 300 mL

## 2017-06-12 MED ORDER — HALOPERIDOL LACTATE 5 MG/ML IJ SOLN
1.0000 mg | Freq: Four times a day (QID) | INTRAMUSCULAR | Status: DC | PRN
  Administered 2017-06-12 – 2017-06-13 (×4): 1 mg via INTRAVENOUS
  Filled 2017-06-12 (×3): qty 1

## 2017-06-12 NOTE — Progress Notes (Signed)
Message text to Dr Posey Pronto that patient has become restless pulling mitts off and tubes with increase heart rate at times. Husband asking for something to calm wife down.

## 2017-06-12 NOTE — Progress Notes (Signed)
Writer went in to assess pt. Per report from day shift RN, pt had a mental status change. Per report, pt was alert and oriented; however upon assessment pt would not open eyes and only responded to pain VSS on BiPAP. RRT was called and came to assess. On-call provider notified. ABG, CT, and MRI ordered. Please see RRT and RT note for further information.

## 2017-06-12 NOTE — Progress Notes (Signed)
[facility] Progress Note  Patient: Kelli Velasquez WUG:[id number]   PCP: Abner Greenspan, MD DOB: 1941-07-03   DOA: 05/26/2017   DOS: 06/12/2017   Date of Service: the patient was seen and examined on 06/12/2017  Subjective: Last night before transfer patient was agitated and after transfer the patient was not awake and oriented and was not able to follow commands.  Neurology was consulted CT scan and MRI brain completed. This morning the patient is awake denies any acute complaint is able to communicate without any difficulty still her phonation is very weak. No nausea no vomiting.  Brief hospital course: Pt. with PMH of CHF, A. fib, CAD, HTN, BOOP; admitted on 05/26/2017, presented with complaint of shortness of breath, was found to have MSSA pneumonia with acute respiratory failure.  Patient was admitted in the ICU with intubation and extubated on 05/30/2017.  Followed by patient had another episode of respiratory distress requiring repeat intubation on 06/02/2017.  Currently extubated on 06/07/2017 and transferred to stepdown unit. After repeat extubation patient remains aphonic. Bronchoscopy performed on 06/02/2017, cultures so far are negative.  Sputum culture is growing MSSA patient completed antibiotic course on 06/11/2017. Also found to have acute systolic CHF, cardiology consulted and currently on IV Lasix recommend inpatient cardiac cath. Currently further plan is continue monitoring for improvement in voice quality as well as further cardiac workup.  Assessment and Plan: 1.  Acute hypoxic respiratory failure. Multifactorial. Currently on nasal cannula.  Continue BiPAP nightly and as needed. Tolerating it better. Continue respiratory care. CCM following. ABG earlier this morning shows hypoxia despite being on 50% FiO2 but currently on 6 L of nasal cannula and 100% saturation. D-dimer is elevated, check lower extremity Doppler. Add chest PT with bed to improve mucus  clearance.  2.  Presumed sleep disordered breathing. BiPAP nightly for now will probably continue on discharge. Follow-up on pulmonary recommendation for the same.  3.  MSSA pneumonia. Antibiotic course completed. Monitor.  4.  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 Dose changed to 15 mg daily.  We will continue that dose during the hospitalization.  5.  Acute on chronic combined CHF. A. fib with RVR. CAD and HLD. Cardiology consulted. Continued on IV Lasix. CVP monitor. Cardiology considering right and left heart cath. Echocardiogram shows decreased EF of 35%. Continue to monitor on the telemetry.  6.  Dysphagia. Aphonia. Speech therapy following. Likely injury from intubation. Monitor. May require bedside swallowing as well as F EES   7.  Type 2 diabetes mellitus. Continue sliding scale insulin.  8.  Protein calorie malnutrition in the acute setting due to poor p.o. intake. Continue tube feeding for now.  Diet: On tube feeding via cor trac DVT Prophylaxis: subcutaneous Heparin  Advance goals of care discussion: DNR DNI  Family Communication: family was present at bedside, at the time of interview. The pt provided permission to discuss medical plan with the family. Opportunity was given to ask question and all questions were answered satisfactorily.   Disposition:  Discharge to be determined.   Consultants: CCM, cardiology Procedures: Echocardiogram, intubation , bronchoscopy  Antibiotics: Anti-infectives (From admission, onward)   Start     Dose/Rate Route Frequency Ordered Stop   06/05/17 2200  cefTRIAXone (ROCEPHIN) 1 g in dextrose 5 % 50 mL IVPB     1 g 100 mL/hr over 30 Minutes Intravenous Every 24 hours 06/05/17 1442 06/11/17 2359   06/03/17 0400  vancomycin (VANCOCIN) 1,250 mg in sodium chloride 0.9 % 250  mL IVPB  Status:  Discontinued     1,250 mg 166.7 mL/hr over 90 Minutes Intravenous Every 24 hours 06/02/17 0343 06/03/17 0919   06/02/17 0345   cefTAZidime (FORTAZ) 2 g in dextrose 5 % 50 mL IVPB  Status:  Discontinued     2 g 100 mL/hr over 30 Minutes Intravenous Every 12 hours 06/02/17 0343 06/05/17 1442   06/02/17 0345  vancomycin (VANCOCIN) 2,000 mg in sodium chloride 0.9 % 500 mL IVPB     2,000 mg 250 mL/hr over 120 Minutes Intravenous  Once 06/02/17 0343 06/02/17 0752   05/28/17 2000  fluconazole (DIFLUCAN) IVPB 100 mg     100 mg 50 mL/hr over 60 Minutes Intravenous  Once 05/28/17 1933 05/28/17 2119       Objective: Physical Exam: Vitals:   06/12/17 1100 06/12/17 1356 06/12/17 1528 06/12/17 1533  BP:   (!) 145/67 (!) 145/67  Pulse:  (!) 118    Resp:  (!) 32    Temp: 98.1 F (36.7 C)     TempSrc: Oral     SpO2:  95%    Weight:      Height:        Intake/Output Summary (Last 24 hours) at 06/12/2017 1538 Last data filed at 06/12/2017 1301 Gross per 24 hour  Intake 920 ml  Output 1850 ml  Net -930 ml   Filed Weights   06/10/17 0500 06/11/17 0500 06/12/17 0427  Weight: 108.4 kg (238 lb 15.7 oz) 107.8 kg (237 lb 10.5 oz) 107.6 kg (237 lb 3.4 oz)   General: Alert, Awake and Oriented to Person. Appear in moderate distress, affect appropriate Eyes: PERRL, Conjunctiva normal ENT: Oral Mucosa clear moist. Neck: no JVD, no Abnormal Mass Or lumps Cardiovascular: S1 and S2 Present, no Murmur, Peripheral Pulses Present Respiratory: increased respiratory effort, Bilateral Air entry equal and Decreased, no use of accessory muscle, bilateral Crackles, Occasional wheezes Abdomen: Bowel Sound present, Soft and no tenderness, no hernia Skin: no redness, no Rash, no induration Extremities: no Pedal edema, no calf tenderness Neurologic: Grossly no focal neuro deficit. Bilaterally Equal motor strength  Data Reviewed: CBC: Recent Labs  Lab 06/06/17 0504 06/07/17 0514 06/08/17 0423 06/09/17 0507 06/10/17 0500 06/11/17 0449 06/11/17 2304 06/12/17 0327  WBC 16.6* 18.7* 32.4* 22.9* 21.5* 18.1* 17.5* 18.5*  NEUTROABS  15.0* 17.4* 29.8*  --   --   --  15.8* 16.2*  HGB 11.2* 10.8* 13.0 12.8 12.9 12.1 11.6* 11.2*  HCT 38.6 37.1 44.8 43.5 44.4 42.1 40.9 39.2  MCV 102.4* 102.8* 102.3* 101.2* 100.9* 103.4* 103.3* 102.6*  PLT 102* 95* 140* 116* 98* 90* 90* 78*   Basic Metabolic Panel: Recent Labs  Lab 06/06/17 0504  06/06/17 0505 06/07/17 0514 06/08/17 0423 06/09/17 0507 06/10/17 0500 06/11/17 0449 06/11/17 2304 06/12/17 0327  NA  --    < > 151* 150* 150* 148* 148* 151* 150* 153*  K  --    < > 3.5 3.2* 3.8 3.5 3.4* 3.7 3.7 3.6  CL  --    < > 113* 112* 112* 109 106 107 106 108  CO2  --    < > 32 31 31 32 34* 37* 37* 38*  GLUCOSE  --    < > 253* 271* 94 151* 220* 122* 127* 85  BUN  --    < > 45* 44* 33* 42* 33* 33* 40* 43*  CREATININE  --    < > 0.88 0.78 0.71 0.97 0.84 0.79 0.88  0.79  CALCIUM  --    < > 8.5* 8.6* 9.3 9.4 9.0 9.1 9.2 8.9  MG 2.0  --   --  2.1 2.4  --   --   --   --  2.5*  PHOS  --   --  3.3 2.6 2.9  --   --   --   --   --    < > = values in this interval not displayed.    Liver Function Tests: Recent Labs  Lab 06/06/17 0505 06/07/17 0514 06/08/17 0423  ALBUMIN 2.3* 2.2* 2.7*   No results for input(s): LIPASE, AMYLASE in the last 168 hours. No results for input(s): AMMONIA in the last 168 hours. Coagulation Profile: No results for input(s): INR, PROTIME in the last 168 hours. Cardiac Enzymes: Recent Labs  Lab 06/12/17 0327 06/12/17 0748  TROPONINI 0.07* 0.07*   BNP (last 3 results) Recent Labs    01/11/17 1053  PROBNP 264.0*   CBG: Recent Labs  Lab 06/11/17 1947 06/11/17 2130 06/11/17 2331 06/12/17 0355 06/12/17 0728  GLUCAP 237* 204* 123* 99 195*   Studies: Ct Head Wo Contrast  Result Date: 06/11/2017 CLINICAL DATA:  Altered mental status EXAM: CT HEAD WITHOUT CONTRAST TECHNIQUE: Contiguous axial images were obtained from the base of the skull through the vertex without intravenous contrast. COMPARISON:  None. FINDINGS: Brain: No hemorrhage or  intracranial mass is visualized. Old appearing focus of encephalomalacia in the left frontal lobe. Asymmetric low attenuation within the left cerebellum white matter. Moderate atrophy. The ventricles are nonenlarged. Small old infarct in the left posterior white matter. Vascular: No hyperdense vessel.  Carotid artery calcification. Skull: No fracture.  Fluid within the bilateral mastoid air cells. Sinuses/Orbits: Mucosal thickening within the sphenoid and ethmoid sinuses. No acute orbital abnormality. Other: None IMPRESSION: 1. Negative for hemorrhage or mass lesion. 2. Asymmetric hypodensity within the left cerebellum white matter tracts, given history of altered mental status, suggest MRI for further evaluation. 3. Atrophy Electronically Signed   By: Donavan Foil M.D.   On: 06/11/2017 22:13   Mr Brain Wo Contrast  Result Date: 06/12/2017 CLINICAL DATA:  Initial evaluation for acute altered mental status. EXAM: MRI HEAD WITHOUT CONTRAST TECHNIQUE: Multiplanar, multiecho pulse sequences of the brain and surrounding structures were obtained without intravenous contrast. COMPARISON:  [facility] CT from 06/11/2017. FINDINGS: Brain: Study mildly degraded by motion artifact. Generalized age related cerebral atrophy. No significant cerebral white matter disease for age. 7 mm focus of T2 hyperintensity involving the cortical gray matter of the anterior left frontal lobe may reflect a small remote ischemic infarct (series 7, image 20). Tiny remote right cerebellar infarct. Cystic lucency within the deep white matter of the left parietal lobe favored to reflect a dilated perivascular space. No abnormal foci of restricted diffusion to suggest acute or subacute ischemia. Gray-white matter differentiation maintained. No evidence for acute or chronic intracranial hemorrhage. No mass lesion, midline shift or mass effect. No hydrocephalus. 3.6 cm extra-axial cystic lesion overlying the left frontal convexity most consistent with  a arachnoid cyst. No associated edema or significant mass effect. No other extra-axial fluid collection. 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 Midline structures intact and normal. Vascular: Major intracranial vascular flow voids maintained. Skull and upper cervical spine: Craniocervical junction within normal limits. Multilevel degenerative spondylolysis noted within the cervical spine. Bone marrow signal intensity within normal limits. Hyperostosis frontalis interna noted. Scalp soft tissues within normal limits. Sinuses/Orbits: Globes oral soft tissues within normal  limits. Patient status post lens extraction bilaterally. Chronic right sphenoid sinus disease. Paranasal sinuses otherwise clear. Bilateral mastoid effusions noted. Other: None. IMPRESSION: 1. No acute intracranial abnormality. 2. 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 3. 3.6 cm arachnoid cyst overlying the left frontal convexity without associated edema or mass effect. Finding felt to be incidental in nature, and of doubtful clinical significance in the acute setting. 4. Chronic right sphenoid sinus disease with bilateral mastoid effusions. Electronically Signed   By: Jeannine Boga M.D.   On: 06/12/2017 03:27   Dg Chest Port 1 View  Result Date: 06/11/2017 CLINICAL DATA:  Shortness of breath EXAM: PORTABLE CHEST 1 VIEW COMPARISON:  06/10/2017, CT chest 06/02/2017, radiograph [id number] 1,[id number] FINDINGS: Right upper extremity catheter tip overlies the cavoatrial junction. Esophageal tube tip overlies the gastroduodenal junction. Hypoventilatory changes. Small left pleural effusion. Minimal atelectasis at the right base. No change in consolidation at the left base. Stable cardiomegaly with atherosclerosis. IMPRESSION: 1. No significant interval change in small left pleural effusion and left basilar atelectasis or pneumonia 2. Hypoventilatory changes 3. Cardiomegaly Electronically Signed   By: Donavan Foil M.D.   On: 06/11/2017  23:00    Scheduled Meds: . aspirin  81 mg Per Tube Daily  . chlorhexidine gluconate (MEDLINE KIT)  15 mL Mouth Rinse BID  . Chlorhexidine Gluconate Cloth  6 each Topical Q0600  . ezetimibe  10 mg Per Tube QHS  . feeding supplement (PRO-STAT SUGAR FREE 64)  30 mL Per Tube TID  . free water  300 mL Per Tube Q8H  . furosemide      . furosemide  40 mg Intravenous Daily  . hydrALAZINE  50 mg Per Tube Q8H   Or  . hydrALAZINE  10 mg Intravenous Q8H  . insulin aspart  0-20 Units Subcutaneous Q4H  . insulin glargine  20 Units Subcutaneous Daily  . ipratropium-albuterol  3 mL Nebulization Q6H  . isosorbide dinitrate  20 mg Per Tube TID  . mouth rinse  15 mL Mouth Rinse QID  . montelukast  10 mg Per Tube QHS  . pantoprazole sodium  40 mg Per Tube Daily  . predniSONE  20 mg Per Tube Q breakfast  . sodium chloride flush  10-40 mL Intracatheter Q12H  . spironolactone  12.5 mg Per Tube Daily   Continuous Infusions: . sodium chloride 250 mL (06/11/17 1400)  . feeding supplement (JEVITY 1.2 CAL) 1,000 mL (06/12/17 0416)   PRN Meds: sodium chloride, albuterol, hydrALAZINE, metoprolol tartrate, sodium chloride flush  Time spent: 35 minutes  Author: Berle Mull, MD [facility] Pager: [phone] 06/12/2017 3:38 PM  If 7PM-7AM, please contact night-coverage at [email or web address], password [email or web address]

## 2017-06-12 NOTE — Progress Notes (Signed)
Transported patient to MRI and back with no incident, plugged NIV back into the wall with previously documented settings.

## 2017-06-12 NOTE — Progress Notes (Signed)
NEURO HOSPITALIST PROGRESS NOTE  Subjective: Patient found laying in bed in NAD. Extubated on room air. Family at bedside. Voices no new complaints and no new, acute events reported overnight. Per family, patient is a her cognitive baseline today  Exam: Vitals:   06/12/17 0719 06/12/17 0740  BP:  (!) 154/45  Pulse:  (!) 109  Resp:  (!) 25  Temp: 98.4 F (36.9 C)   SpO2:  90%   Gen: In bed, NAD Resp: non-labored breathing, no acute distress Abd: soft, nt MS: Awake, alert. Oriented x 3. Follow all commands CN: intact. Extremely HOH, Aphonia, weak cough. Able to mouth words and occasionally speak with very low voice. Motor: 5/5 throughout, normal tone. Difficulty writing on paper, may be due to need to wear glasses to read. Sensory: grossly intact bilaterally DTR:2+ symmetric, thoroughout  Pertinent Labs & Imaging: Bl gas CO2 remains elevated, Na 153, Mag 2.5, Troponin 0.7, glucose 195  MRI Head today - No acute findings. 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 Incidental finding: 3.6 cm arachnoid cyst overlying the left frontal convexity without associated edema or mass effect  Impression: Encephalopathy - Resolving. Some improvement in electrolyte abnormalities and hypercarbia Dysphagia and Aphonia - likely from intubation. Improving slowly  Recommendations: Will continue to watch neuro exam closely No need for EEG at this time No signs of stroke SLP Evaluation for Dysphagia  Will continue to follow  Ellon Marasco Sella. ANP-C [facility] 06/12/2017, 11:51 AM

## 2017-06-12 NOTE — Plan of Care (Signed)
Many goals will be difficult do to pt mental statuus

## 2017-06-12 NOTE — Progress Notes (Signed)
Changed NIV to 16/6 and 60% due to oxygenation needs.

## 2017-06-12 NOTE — Progress Notes (Signed)
[facility] Progress Note  Patient: Kelli Velasquez SWN:[id number]   PCP: Abner Greenspan, MD DOB: 11/18/1940   DOA: 05/26/2017   DOS: 06/12/2017   Date of Service: the patient was seen and examined on 06/12/2017  Subjective: Last night before transfer patient was agitated and after transfer the patient was not awake and oriented and was not able to follow commands.  Neurology was consulted CT scan and MRI brain completed. This morning the patient is awake denies any acute complaint is able to communicate without any difficulty still her phonation is very weak. No nausea no vomiting.  Brief hospital course: Pt. with PMH of CHF, A. fib, CAD, HTN, BOOP; admitted on 05/26/2017, presented with complaint of shortness of breath, was found to have MSSA pneumonia with acute respiratory failure.  Patient was admitted in the ICU with intubation and extubated on 05/30/2017.  Followed by patient had another episode of respiratory distress requiring repeat intubation on 06/02/2017.  Currently extubated on 06/07/2017 and transferred to stepdown unit. After repeat extubation patient remains aphonic. Bronchoscopy performed on 06/02/2017, cultures so far are negative.  Sputum culture is growing MSSA patient completed antibiotic course on 06/11/2017. Also found to have acute systolic CHF, cardiology consulted and currently on IV Lasix recommend inpatient cardiac cath. Currently further plan is continue monitoring for improvement in voice quality as well as further cardiac workup.  Assessment and Plan: 1.  Acute hypoxic respiratory failure. Multifactorial. Currently on nasal cannula.  Continue BiPAP nightly and as needed. Tolerating it better. Continue respiratory care. CCM following. ABG earlier this morning shows hypoxia despite being on 50% FiO2 but currently on 6 L of nasal cannula and 100% saturation. D-dimer is elevated, check lower extremity Doppler. Add chest PT with bed to improve mucus  clearance.  2.  Presumed sleep disordered breathing. BiPAP nightly for now will probably continue on discharge. Follow-up on pulmonary recommendation for the same.  3.  MSSA pneumonia. Antibiotic course completed. Monitor.  4.  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 Dose changed to 15 mg daily.  We will continue that dose during the hospitalization.  5.  Acute on chronic combined CHF. A. fib with RVR. CAD and HLD. Cardiology consulted. Continued on IV Lasix. CVP monitor. Cardiology considering right and left heart cath. Echocardiogram shows decreased EF of 35%. Continue to monitor on the telemetry.  6.  Dysphagia. Aphonia. Speech therapy following. Likely injury from intubation. Monitor. May require bedside swallowing as well as F EES   7.  Type 2 diabetes mellitus. Continue sliding scale insulin.  8.  Protein calorie malnutrition in the acute setting due to poor p.o. intake. Continue tube feeding for now.  9.  Acute encephalopathy. Likely multifactorial with deconditioning as well as recurrent intubation causing delirium along with hypercarbia. CT scan MRI brain negative for any acute intracranial abnormality. Minimize narcotics minimize sedating medication.  Monitor.  Diet: On tube feeding via cor trac DVT Prophylaxis: subcutaneous Heparin  Advance goals of care discussion: DNR DNI  Family Communication: family was present at bedside, at the time of interview. The pt provided permission to discuss medical plan with the family. Opportunity was given to ask question and all questions were answered satisfactorily.   Disposition:  Discharge to be determined.   Consultants: CCM, cardiology Procedures: Echocardiogram, intubation , bronchoscopy  Antibiotics: Anti-infectives (From admission, onward)   Start     Dose/Rate Route Frequency Ordered Stop   06/05/17 2200  cefTRIAXone (ROCEPHIN) 1 g in dextrose 5 % 50  mL IVPB     1 g 100 mL/hr over 30 Minutes Intravenous  Every 24 hours 06/05/17 1442 06/11/17 2359   06/03/17 0400  vancomycin (VANCOCIN) 1,250 mg in sodium chloride 0.9 % 250 mL IVPB  Status:  Discontinued     1,250 mg 166.7 mL/hr over 90 Minutes Intravenous Every 24 hours 06/02/17 0343 06/03/17 0919   06/02/17 0345  cefTAZidime (FORTAZ) 2 g in dextrose 5 % 50 mL IVPB  Status:  Discontinued     2 g 100 mL/hr over 30 Minutes Intravenous Every 12 hours 06/02/17 0343 06/05/17 1442   06/02/17 0345  vancomycin (VANCOCIN) 2,000 mg in sodium chloride 0.9 % 500 mL IVPB     2,000 mg 250 mL/hr over 120 Minutes Intravenous  Once 06/02/17 0343 06/02/17 0752   05/28/17 2000  fluconazole (DIFLUCAN) IVPB 100 mg     100 mg 50 mL/hr over 60 Minutes Intravenous  Once 05/28/17 1933 05/28/17 2119       Objective: Physical Exam: Vitals:   06/12/17 1100 06/12/17 1356 06/12/17 1528 06/12/17 1533  BP:   (!) 145/67 (!) 145/67  Pulse:  (!) 118    Resp:  (!) 32    Temp: 98.1 F (36.7 C)     TempSrc: Oral     SpO2:  95%    Weight:      Height:        Intake/Output Summary (Last 24 hours) at 06/12/2017 1540 Last data filed at 06/12/2017 1301 Gross per 24 hour  Intake 920 ml  Output 1850 ml  Net -930 ml   Filed Weights   06/10/17 0500 06/11/17 0500 06/12/17 0427  Weight: 108.4 kg (238 lb 15.7 oz) 107.8 kg (237 lb 10.5 oz) 107.6 kg (237 lb 3.4 oz)   General: Alert, Awake and Oriented to Person. Appear in moderate distress, affect appropriate Eyes: PERRL, Conjunctiva normal ENT: Oral Mucosa clear moist. Neck: no JVD, no Abnormal Mass Or lumps Cardiovascular: S1 and S2 Present, no Murmur, Peripheral Pulses Present Respiratory: increased respiratory effort, Bilateral Air entry equal and Decreased, no use of accessory muscle, bilateral Crackles, Occasional wheezes Abdomen: Bowel Sound present, Soft and no tenderness, no hernia Skin: no redness, no Rash, no induration Extremities: no Pedal edema, no calf tenderness Neurologic: Grossly no focal neuro  deficit. Bilaterally Equal motor strength  Data Reviewed: CBC: Recent Labs  Lab 06/06/17 0504 06/07/17 0514 06/08/17 0423 06/09/17 0507 06/10/17 0500 06/11/17 0449 06/11/17 2304 06/12/17 0327  WBC 16.6* 18.7* 32.4* 22.9* 21.5* 18.1* 17.5* 18.5*  NEUTROABS 15.0* 17.4* 29.8*  --   --   --  15.8* 16.2*  HGB 11.2* 10.8* 13.0 12.8 12.9 12.1 11.6* 11.2*  HCT 38.6 37.1 44.8 43.5 44.4 42.1 40.9 39.2  MCV 102.4* 102.8* 102.3* 101.2* 100.9* 103.4* 103.3* 102.6*  PLT 102* 95* 140* 116* 98* 90* 90* 78*   Basic Metabolic Panel: Recent Labs  Lab 06/06/17 0504  06/06/17 0505 06/07/17 0514 06/08/17 0423 06/09/17 0507 06/10/17 0500 06/11/17 0449 06/11/17 2304 06/12/17 0327  NA  --    < > 151* 150* 150* 148* 148* 151* 150* 153*  K  --    < > 3.5 3.2* 3.8 3.5 3.4* 3.7 3.7 3.6  CL  --    < > 113* 112* 112* 109 106 107 106 108  CO2  --    < > 32 31 31 32 34* 37* 37* 38*  GLUCOSE  --    < > 253* 271* 94 151* 220*  122* 127* 85  BUN  --    < > 45* 44* 33* 42* 33* 33* 40* 43*  CREATININE  --    < > 0.88 0.78 0.71 0.97 0.84 0.79 0.88 0.79  CALCIUM  --    < > 8.5* 8.6* 9.3 9.4 9.0 9.1 9.2 8.9  MG 2.0  --   --  2.1 2.4  --   --   --   --  2.5*  PHOS  --   --  3.3 2.6 2.9  --   --   --   --   --    < > = values in this interval not displayed.    Liver Function Tests: Recent Labs  Lab 06/06/17 0505 06/07/17 0514 06/08/17 0423  ALBUMIN 2.3* 2.2* 2.7*   No results for input(s): LIPASE, AMYLASE in the last 168 hours. No results for input(s): AMMONIA in the last 168 hours. Coagulation Profile: No results for input(s): INR, PROTIME in the last 168 hours. Cardiac Enzymes: Recent Labs  Lab 06/12/17 0327 06/12/17 0748  TROPONINI 0.07* 0.07*   BNP (last 3 results) Recent Labs    01/11/17 1053  PROBNP 264.0*   CBG: Recent Labs  Lab 06/11/17 1947 06/11/17 2130 06/11/17 2331 06/12/17 0355 06/12/17 0728  GLUCAP 237* 204* 123* 99 195*   Studies: Ct Head Wo Contrast  Result  Date: 06/11/2017 CLINICAL DATA:  Altered mental status EXAM: CT HEAD WITHOUT CONTRAST TECHNIQUE: Contiguous axial images were obtained from the base of the skull through the vertex without intravenous contrast. COMPARISON:  None. FINDINGS: Brain: No hemorrhage or intracranial mass is visualized. Old appearing focus of encephalomalacia in the left frontal lobe. Asymmetric low attenuation within the left cerebellum white matter. Moderate atrophy. The ventricles are nonenlarged. Small old infarct in the left posterior white matter. Vascular: No hyperdense vessel.  Carotid artery calcification. Skull: No fracture.  Fluid within the bilateral mastoid air cells. Sinuses/Orbits: Mucosal thickening within the sphenoid and ethmoid sinuses. No acute orbital abnormality. Other: None IMPRESSION: 1. Negative for hemorrhage or mass lesion. 2. Asymmetric hypodensity within the left cerebellum white matter tracts, given history of altered mental status, suggest MRI for further evaluation. 3. Atrophy Electronically Signed   By: Donavan Foil M.D.   On: 06/11/2017 22:13   Mr Brain Wo Contrast  Result Date: 06/12/2017 CLINICAL DATA:  Initial evaluation for acute altered mental status. EXAM: MRI HEAD WITHOUT CONTRAST TECHNIQUE: Multiplanar, multiecho pulse sequences of the brain and surrounding structures were obtained without intravenous contrast. COMPARISON:  [facility] CT from 06/11/2017. FINDINGS: Brain: Study mildly degraded by motion artifact. Generalized age related cerebral atrophy. No significant cerebral white matter disease for age. 7 mm focus of T2 hyperintensity involving the cortical gray matter of the anterior left frontal lobe may reflect a small remote ischemic infarct (series 7, image 20). Tiny remote right cerebellar infarct. Cystic lucency within the deep white matter of the left parietal lobe favored to reflect a dilated perivascular space. No abnormal foci of restricted diffusion to suggest acute or subacute  ischemia. Gray-white matter differentiation maintained. No evidence for acute or chronic intracranial hemorrhage. No mass lesion, midline shift or mass effect. No hydrocephalus. 3.6 cm extra-axial cystic lesion overlying the left frontal convexity most consistent with a arachnoid cyst. No associated edema or significant mass effect. No other extra-axial fluid collection. 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 Midline structures intact and normal. Vascular: Major intracranial vascular flow voids maintained. Skull and upper cervical spine: Craniocervical  junction within normal limits. Multilevel degenerative spondylolysis noted within the cervical spine. Bone marrow signal intensity within normal limits. Hyperostosis frontalis interna noted. Scalp soft tissues within normal limits. Sinuses/Orbits: Globes oral soft tissues within normal limits. Patient status post lens extraction bilaterally. Chronic right sphenoid sinus disease. Paranasal sinuses otherwise clear. Bilateral mastoid effusions noted. Other: None. IMPRESSION: 1. No acute intracranial abnormality. 2. 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 3. 3.6 cm arachnoid cyst overlying the left frontal convexity without associated edema or mass effect. Finding felt to be incidental in nature, and of doubtful clinical significance in the acute setting. 4. Chronic right sphenoid sinus disease with bilateral mastoid effusions. Electronically Signed   By: Jeannine Boga M.D.   On: 06/12/2017 03:27   Dg Chest Port 1 View  Result Date: 06/11/2017 CLINICAL DATA:  Shortness of breath EXAM: PORTABLE CHEST 1 VIEW COMPARISON:  06/10/2017, CT chest 06/02/2017, radiograph [id number] 1,[id number] FINDINGS: Right upper extremity catheter tip overlies the cavoatrial junction. Esophageal tube tip overlies the gastroduodenal junction. Hypoventilatory changes. Small left pleural effusion. Minimal atelectasis at the right base. No change in consolidation at the left base.  Stable cardiomegaly with atherosclerosis. IMPRESSION: 1. No significant interval change in small left pleural effusion and left basilar atelectasis or pneumonia 2. Hypoventilatory changes 3. Cardiomegaly Electronically Signed   By: Donavan Foil M.D.   On: 06/11/2017 23:00    Scheduled Meds: . aspirin  81 mg Per Tube Daily  . chlorhexidine gluconate (MEDLINE KIT)  15 mL Mouth Rinse BID  . Chlorhexidine Gluconate Cloth  6 each Topical Q0600  . ezetimibe  10 mg Per Tube QHS  . feeding supplement (PRO-STAT SUGAR FREE 64)  30 mL Per Tube TID  . free water  300 mL Per Tube Q8H  . furosemide      . furosemide  40 mg Intravenous Daily  . hydrALAZINE  50 mg Per Tube Q8H   Or  . hydrALAZINE  10 mg Intravenous Q8H  . insulin aspart  0-20 Units Subcutaneous Q4H  . insulin glargine  20 Units Subcutaneous Daily  . ipratropium-albuterol  3 mL Nebulization Q6H  . isosorbide dinitrate  20 mg Per Tube TID  . mouth rinse  15 mL Mouth Rinse QID  . montelukast  10 mg Per Tube QHS  . pantoprazole sodium  40 mg Per Tube Daily  . predniSONE  20 mg Per Tube Q breakfast  . sodium chloride flush  10-40 mL Intracatheter Q12H  . spironolactone  12.5 mg Per Tube Daily   Continuous Infusions: . sodium chloride 250 mL (06/11/17 1400)  . feeding supplement (JEVITY 1.2 CAL) 1,000 mL (06/12/17 0416)   PRN Meds: sodium chloride, albuterol, hydrALAZINE, metoprolol tartrate, sodium chloride flush  Time spent: 35 minutes  Author: Berle Mull, MD [facility] Pager: [phone] 06/12/2017 3:40 PM  If 7PM-7AM, please contact night-coverage at [email or web address], password [facility]

## 2017-06-13 ENCOUNTER — Inpatient Hospital Stay (HOSPITAL_COMMUNITY): Payer: Medicare Other

## 2017-06-13 LAB — CBC WITH DIFFERENTIAL/PLATELET
Basophils Absolute: 0 10*3/uL (ref 0.0–0.1)
Basophils Relative: 0 %
EOS ABS: 0.2 10*3/uL (ref 0.0–0.7)
EOS PCT: 1 %
HCT: 39.1 % (ref 36.0–46.0)
Hemoglobin: 11.2 g/dL — ABNORMAL LOW (ref 12.0–15.0)
LYMPHS ABS: 0.6 10*3/uL — AB (ref 0.7–4.0)
Lymphocytes Relative: 4 %
MCH: 29.9 pg (ref 26.0–34.0)
MCHC: 28.6 g/dL — ABNORMAL LOW (ref 30.0–36.0)
MCV: 104.3 fL — ABNORMAL HIGH (ref 78.0–100.0)
Monocytes Absolute: 0.4 10*3/uL (ref 0.1–1.0)
Monocytes Relative: 3 %
Neutro Abs: 15.5 10*3/uL — ABNORMAL HIGH (ref 1.7–7.7)
Neutrophils Relative %: 93 %
PLATELETS: 83 10*3/uL — AB (ref 150–400)
RBC: 3.75 MIL/uL — ABNORMAL LOW (ref 3.87–5.11)
RDW: 16.2 % — AB (ref 11.5–15.5)
WBC: 16.7 10*3/uL — ABNORMAL HIGH (ref 4.0–10.5)

## 2017-06-13 LAB — GLUCOSE, CAPILLARY
GLUCOSE-CAPILLARY: 185 mg/dL — AB (ref 65–99)
GLUCOSE-CAPILLARY: 162 mg/dL — AB (ref 65–99)
GLUCOSE-CAPILLARY: 175 mg/dL — AB (ref 65–99)
GLUCOSE-CAPILLARY: 195 mg/dL — AB (ref 65–99)
Glucose-Capillary: 142 mg/dL — ABNORMAL HIGH (ref 65–99)
Glucose-Capillary: 259 mg/dL — ABNORMAL HIGH (ref 65–99)

## 2017-06-13 LAB — BASIC METABOLIC PANEL
ANION GAP: 6 (ref 5–15)
Anion gap: 8 (ref 5–15)
BUN: 40 mg/dL — ABNORMAL HIGH (ref 6–20)
BUN: 37 mg/dL — AB (ref 6–20)
CHLORIDE: 109 mmol/L (ref 101–111)
CHLORIDE: 104 mmol/L (ref 101–111)
CO2: 39 mmol/L — ABNORMAL HIGH (ref 22–32)
CO2: 39 mmol/L — ABNORMAL HIGH (ref 22–32)
CREATININE: 0.86 mg/dL (ref 0.44–1.00)
Calcium: 9.2 mg/dL (ref 8.9–10.3)
Calcium: 9.2 mg/dL (ref 8.9–10.3)
Creatinine, Ser: 0.81 mg/dL (ref 0.44–1.00)
GFR calc Af Amer: >60 mL/min (ref >60)
Glucose, Bld: 200 mg/dL — ABNORMAL HIGH (ref 65–99)
Glucose, Bld: 203 mg/dL — ABNORMAL HIGH (ref 65–99)
POTASSIUM: 3.7 mmol/L (ref 3.5–5.1)
Potassium: 4.1 mmol/L (ref 3.5–5.1)
SODIUM: 154 mmol/L — AB (ref 135–145)
SODIUM: 151 mmol/L — AB (ref 135–145)

## 2017-06-13 LAB — HEPATIC FUNCTION PANEL
ALBUMIN: 2.8 g/dL — AB (ref 3.5–5.0)
ALT: 33 U/L (ref 14–54)
AST: 25 U/L (ref 15–41)
Alkaline Phosphatase: 58 U/L (ref 38–126)
BILIRUBIN DIRECT: 0.2 mg/dL (ref 0.1–0.5)
BILIRUBIN INDIRECT: 0.7 mg/dL (ref 0.3–0.9)
BILIRUBIN TOTAL: 0.9 mg/dL (ref 0.3–1.2)
Total Protein: 5.5 g/dL — ABNORMAL LOW (ref 6.5–8.1)

## 2017-06-13 LAB — TECHNOLOGIST SMEAR REVIEW

## 2017-06-13 LAB — LACTATE DEHYDROGENASE: LDH: 350 U/L — AB (ref 98–192)

## 2017-06-13 MED ORDER — DEXTROSE 5 % IV SOLN
INTRAVENOUS | Status: AC
  Administered 2017-06-13: 11:00:00 via INTRAVENOUS

## 2017-06-13 MED ORDER — INSULIN GLARGINE 100 UNIT/ML ~~LOC~~ SOLN
26.0000 [IU] | Freq: Every day | SUBCUTANEOUS | Status: DC
  Administered 2017-06-13 – 2017-06-26 (×13): 26 [IU] via SUBCUTANEOUS
  Filled 2017-06-13 (×14): qty 0.26

## 2017-06-13 MED ORDER — SODIUM CHLORIDE 0.9 % IV SOLN
INTRAVENOUS | Status: DC
  Administered 2017-06-13: 1 mL via INTRAVENOUS

## 2017-06-13 NOTE — Progress Notes (Signed)
Text Dr Posey Pronto with change in patient's resp status increase respirations and decrease sats. Respiratory in to try vent mask.

## 2017-06-13 NOTE — Progress Notes (Signed)
[facility] Progress Note  Patient: SHRITHA BRESEE VZD:[id number]   PCP: Abner Greenspan, MD DOB: [birth year]   DOA: 05/26/2017   DOS: 06/13/2017   Date of Service: the patient was seen and examined on 06/13/2017  Subjective: No acute events.  Stable.  Later in the afternoon the nurse reported that the patient has some agitation and is having tachypnea.  No nausea no vomiting no diarrhea reported.  Brief hospital course: Pt. with PMH of CHF, A. fib, CAD, HTN, BOOP; admitted on 05/26/2017, presented with complaint of shortness of breath, was found to have MSSA pneumonia with acute respiratory failure.  Patient was admitted in the ICU with intubation and extubated on 05/30/2017.  Followed by patient had another episode of respiratory distress requiring repeat intubation on 06/02/2017.  Currently extubated on 06/07/2017 and transferred to stepdown unit. After repeat extubation patient remains aphonic. Bronchoscopy performed on 06/02/2017, cultures so far are negative.  Sputum culture is growing MSSA patient completed antibiotic course on 06/11/2017. Also found to have acute systolic CHF, cardiology consulted and currently on IV Lasix recommend inpatient cardiac cath. Currently further plan is continue monitoring for improvement in voice quality as well as further cardiac workup.  Assessment and Plan: 1.  Acute hypoxic respiratory failure. Multifactorial. Currently on nasal cannula.  Continue BiPAP nightly and as needed. Tolerating it better. Continue respiratory care. CCM following. ABG earlier this morning shows hypoxia despite being on 50% FiO2 but currently on 6 L of nasal cannula and 100% saturation. D-dimer is elevated, check lower extremity Doppler. Add chest PT with bed to improve mucus clearance.  2.  Presumed sleep disordered breathing. BiPAP nightly for now will probably continue on discharge. Follow-up on pulmonary recommendation for the same.  3.  MSSA pneumonia. Antibiotic  course completed. Monitor.  4.  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 Dose changed to 15 mg daily.  We will continue that dose during the hospitalization.  5.  Acute on chronic combined CHF. A. fib with RVR. CAD and HLD. Cardiology consulted. Continued on IV Lasix. CVP monitor. Cardiology considering right and left heart cath. Echocardiogram shows decreased EF of 35%. Continue to monitor on the telemetry.  6.  Dysphagia. Aphonia. Speech therapy following. Likely injury from intubation. Monitor. May require bedside swallowing as well as F EES   7.  Type 2 diabetes mellitus. Continue sliding scale insulin.  8.  Protein calorie malnutrition in the acute setting due to poor p.o. intake. Continue tube feeding for now.  9.  Acute encephalopathy. Likely multifactorial with deconditioning as well as recurrent intubation causing delirium along with hypercarbia. CT scan MRI brain negative for any acute intracranial abnormality. Minimize narcotics minimize sedating medication.  Monitor. Patient is hard of hearing, need to verify every time if there is a change in mental status whether that is because of patient's poor hearing.  Diet: On tube feeding via cor trac DVT Prophylaxis: subcutaneous Heparin  Advance goals of care discussion: DNR DNI  Family Communication: no family was present at bedside, at the time of interview.  Discussed with patient's husband on 06/12/2017  Disposition:  Discharge to be determined.   Consultants: CCM, cardiology Procedures: Echocardiogram, intubation , bronchoscopy  Antibiotics: Anti-infectives (From admission, onward)   Start     Dose/Rate Route Frequency Ordered Stop   06/05/17 2200  cefTRIAXone (ROCEPHIN) 1 g in dextrose 5 % 50 mL IVPB     1 g 100 mL/hr over 30 Minutes Intravenous Every 24 hours 06/05/17 1442 06/11/17  2359   06/03/17 0400  vancomycin (VANCOCIN) 1,250 mg in sodium chloride 0.9 % 250 mL IVPB  Status:  Discontinued     1,250  mg 166.7 mL/hr over 90 Minutes Intravenous Every 24 hours 06/02/17 0343 06/03/17 0919   06/02/17 0345  cefTAZidime (FORTAZ) 2 g in dextrose 5 % 50 mL IVPB  Status:  Discontinued     2 g 100 mL/hr over 30 Minutes Intravenous Every 12 hours 06/02/17 0343 06/05/17 1442   06/02/17 0345  vancomycin (VANCOCIN) 2,000 mg in sodium chloride 0.9 % 500 mL IVPB     2,000 mg 250 mL/hr over 120 Minutes Intravenous  Once 06/02/17 0343 06/02/17 0752   05/28/17 2000  fluconazole (DIFLUCAN) IVPB 100 mg     100 mg 50 mL/hr over 60 Minutes Intravenous  Once 05/28/17 1933 05/28/17 2119       Objective: Physical Exam: Vitals:   06/13/17 0753 06/13/17 1343 06/13/17 1346 06/13/17 1411  BP: 132/67  111/66 111/66  Pulse: (!) 101   (!) 103  Resp: (!) 31   (!) 22  Temp:  98.4 F (36.9 C)    TempSrc:  Axillary    SpO2: 100%   100%  Weight:      Height:        Intake/Output Summary (Last 24 hours) at 06/13/2017 1428 Last data filed at 06/13/2017 1341 Gross per 24 hour  Intake 1690 ml  Output 2025 ml  Net -335 ml   Filed Weights   06/11/17 0500 06/12/17 0427 06/13/17 0402  Weight: 107.8 kg (237 lb 10.5 oz) 107.6 kg (237 lb 3.4 oz) 107.8 kg (237 lb 10.5 oz)   General: Alert, Awake and Oriented to Person. Appear in moderate distress, affect appropriate Eyes: PERRL, Conjunctiva normal ENT: Oral Mucosa clear moist. Neck: no JVD, no Abnormal Mass Or lumps Cardiovascular: S1 and S2 Present, no Murmur, Peripheral Pulses Present Respiratory: increased respiratory effort, Bilateral Air entry equal and Decreased, no use of accessory muscle, bilateral Crackles, Occasional wheezes Abdomen: Bowel Sound present, Soft and no tenderness, no hernia Skin: no redness, no Rash, no induration Extremities: no Pedal edema, no calf tenderness Neurologic: Grossly no focal neuro deficit. Bilaterally Equal motor strength  Data Reviewed: CBC: Recent Labs  Lab 06/07/17 0514 06/08/17 0423 06/09/17 0507 06/10/17 0500  06/11/17 0449 06/11/17 2304 06/12/17 0327  WBC 18.7* 32.4* 22.9* 21.5* 18.1* 17.5* 18.5*  NEUTROABS 17.4* 29.8*  --   --   --  15.8* 16.2*  HGB 10.8* 13.0 12.8 12.9 12.1 11.6* 11.2*  HCT 37.1 44.8 43.5 44.4 42.1 40.9 39.2  MCV 102.8* 102.3* 101.2* 100.9* 103.4* 103.3* 102.6*  PLT 95* 140* 116* 98* 90* 90* 78*   Basic Metabolic Panel: Recent Labs  Lab 06/07/17 0514 06/08/17 0423  06/10/17 0500 06/11/17 0449 06/11/17 2304 06/12/17 0327 06/13/17 0600  NA 150* 150*   < > 148* 151* 150* 153* 154*  K 3.2* 3.8   < > 3.4* 3.7 3.7 3.6 3.7  CL 112* 112*   < > 106 107 106 108 109  CO2 31 31   < > 34* 37* 37* 38* 39*  GLUCOSE 271* 94   < > 220* 122* 127* 85 200*  BUN 44* 33*   < > 33* 33* 40* 43* 40*  CREATININE 0.78 0.71   < > 0.84 0.79 0.88 0.79 0.81  CALCIUM 8.6* 9.3   < > 9.0 9.1 9.2 8.9 9.2  MG 2.1 2.4  --   --   --   --  2.5*  --   PHOS 2.6 2.9  --   --   --   --   --   --    < > = values in this interval not displayed.    Liver Function Tests: Recent Labs  Lab 06/07/17 0514 06/08/17 0423  ALBUMIN 2.2* 2.7*   No results for input(s): LIPASE, AMYLASE in the last 168 hours. No results for input(s): AMMONIA in the last 168 hours. Coagulation Profile: No results for input(s): INR, PROTIME in the last 168 hours. Cardiac Enzymes: Recent Labs  Lab 06/12/17 0327 06/12/17 0748 06/12/17 1702  TROPONINI 0.07* 0.07* 0.07*   BNP (last 3 results) Recent Labs    01/11/17 1053  PROBNP 264.0*   CBG: Recent Labs  Lab 06/12/17 2025 06/13/17 0022 06/13/17 0358 06/13/17 0755 06/13/17 1314  GLUCAP 126* 142* 185* 162* 175*   Studies: No results found.  Scheduled Meds: . aspirin  81 mg Per Tube Daily  . chlorhexidine gluconate (MEDLINE KIT)  15 mL Mouth Rinse BID  . Chlorhexidine Gluconate Cloth  6 each Topical Q0600  . ezetimibe  10 mg Per Tube QHS  . feeding supplement (PRO-STAT SUGAR FREE 64)  30 mL Per Tube TID  . free water  300 mL Per Tube Q8H  . furosemide  40  mg Intravenous Daily  . hydrALAZINE  50 mg Per Tube Q8H   Or  . hydrALAZINE  10 mg Intravenous Q8H  . insulin aspart  0-20 Units Subcutaneous Q4H  . insulin glargine  26 Units Subcutaneous Daily  . ipratropium-albuterol  3 mL Nebulization Q6H  . isosorbide dinitrate  20 mg Per Tube TID  . mouth rinse  15 mL Mouth Rinse QID  . montelukast  10 mg Per Tube QHS  . pantoprazole sodium  40 mg Per Tube Daily  . predniSONE  20 mg Per Tube Q breakfast  . sodium chloride flush  10-40 mL Intracatheter Q12H  . spironolactone  12.5 mg Per Tube Daily   Continuous Infusions: . sodium chloride 250 mL (06/11/17 1400)  . dextrose 50 mL/hr at 06/13/17 1109  . feeding supplement (JEVITY 1.2 CAL) 1,000 mL (06/12/17 2138)   PRN Meds: sodium chloride, albuterol, haloperidol lactate, hydrALAZINE, metoprolol tartrate, sodium chloride flush  Time spent: 35 minutes  Author: Berle Mull, MD [facility] Pager: [phone] 06/13/2017 2:28 PM  If 7PM-7AM, please contact night-coverage at [email or web address], password [facility]

## 2017-06-13 NOTE — Progress Notes (Signed)
Attempt for LE venous duplex. Patient has a lot going on right now. RN asked that we try back later.  Landry Mellow, RDMS, RVT

## 2017-06-13 NOTE — Progress Notes (Signed)
NEURO HOSPITALIST PROGRESS NOTE  Subjective: Patient found laying in bed in NAD. No family at bedside. Per nurse patient recently medicated with Haldol for agitation and pulling off mitts. Patient voices no new complaints. No new, acute events reported overnight.   Exam: Vitals:   06/13/17 0744 06/13/17 0753  BP:  132/67  Pulse:  (!) 101  Resp:  (!) 31  Temp: 99.1 F (37.3 C)   SpO2:  100%   Gen: In bed, NAD Resp: non-labored breathing, no acute distress Abd: soft, nt MS: Awake, alert. Oriented x 3. Follow all commands CN: intact. Extremely HOH, Aphonia, weak cough. Able to mouth words, appropriate content,  Can make very low, understandable speech sounds Motor: 4+-5/5 throughout, normal tone.  Sensory: grossly intact bilaterally DTR:2+ symmetric, thoroughout  Pertinent Labs & Imaging: Na 154, glucose 200  MRI Head today - No acute findings. 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 Incidental finding: 3.6 cm arachnoid cyst overlying the left frontal convexity without associated edema or mass effect  Impression: Encephalopathy - Resolving. Recently medicated with Haldol for agitation. Dysphagia and Aphonia - Improving slowly. Likely Sundowning - reports of agitation overnight  Recommendations: No signs of stroke or seizure activity Limit use of Haldol as it will make her neuro exam worse. Consider scheduled Seroquel @ HS if she continues to have signs of sundowning. Encourage family sleep overnight with patient. Continue to correct electrolytes and glucose SLP Evaluation for Dysphagia- will need intensive Rehab and EMST/IMST to facilitate strength of cough/swallow.  Neurology team to sign-off. Please call with any further questions or concerns. Thank you for this consultation.  Shaun Runyon A Diella Gillingham. ANP-C [facility] 06/13/2017, 8:45 AM

## 2017-06-14 ENCOUNTER — Inpatient Hospital Stay (HOSPITAL_COMMUNITY): Payer: Medicare Other

## 2017-06-14 DIAGNOSIS — R609 Edema, unspecified: Secondary | ICD-10-CM

## 2017-06-14 LAB — BASIC METABOLIC PANEL
Anion gap: 7 (ref 5–15)
BUN: 35 mg/dL — ABNORMAL HIGH (ref 6–20)
CHLORIDE: 101 mmol/L (ref 101–111)
CO2: 39 mmol/L — ABNORMAL HIGH (ref 22–32)
Calcium: 8.8 mg/dL — ABNORMAL LOW (ref 8.9–10.3)
Creatinine, Ser: 0.74 mg/dL (ref 0.44–1.00)
Glucose, Bld: 154 mg/dL — ABNORMAL HIGH (ref 65–99)
Potassium: 3.5 mmol/L (ref 3.5–5.1)
SODIUM: 147 mmol/L — AB (ref 135–145)

## 2017-06-14 LAB — CBC WITH DIFFERENTIAL/PLATELET
BASOS ABS: 0 10*3/uL (ref 0.0–0.1)
BASOS PCT: 0 %
Eosinophils Absolute: 0.4 10*3/uL (ref 0.0–0.7)
Eosinophils Relative: 2 %
HCT: 37 % (ref 36.0–46.0)
HEMOGLOBIN: 10.3 g/dL — AB (ref 12.0–15.0)
LYMPHS ABS: 1.8 10*3/uL (ref 0.7–4.0)
Lymphocytes Relative: 11 %
MCH: 28.7 pg (ref 26.0–34.0)
MCHC: 27.8 g/dL — ABNORMAL LOW (ref 30.0–36.0)
MCV: 103.1 fL — ABNORMAL HIGH (ref 78.0–100.0)
Monocytes Absolute: 0.8 10*3/uL (ref 0.1–1.0)
Monocytes Relative: 5 %
NEUTROS PCT: 81 %
Neutro Abs: 12.7 10*3/uL — ABNORMAL HIGH (ref 1.7–7.7)
Platelets: 85 10*3/uL — ABNORMAL LOW (ref 150–400)
RBC: 3.59 MIL/uL — AB (ref 3.87–5.11)
RDW: 16.1 % — ABNORMAL HIGH (ref 11.5–15.5)
WBC: 15.6 10*3/uL — AB (ref 4.0–10.5)

## 2017-06-14 LAB — GLUCOSE, CAPILLARY
GLUCOSE-CAPILLARY: 98 mg/dL (ref 65–99)
GLUCOSE-CAPILLARY: 148 mg/dL — AB (ref 65–99)
Glucose-Capillary: 125 mg/dL — ABNORMAL HIGH (ref 65–99)
Glucose-Capillary: 173 mg/dL — ABNORMAL HIGH (ref 65–99)
Glucose-Capillary: 179 mg/dL — ABNORMAL HIGH (ref 65–99)
Glucose-Capillary: 97 mg/dL (ref 65–99)

## 2017-06-14 LAB — FIBRINOGEN: FIBRINOGEN: 346 mg/dL (ref 210–475)

## 2017-06-14 LAB — HAPTOGLOBIN: Haptoglobin: 219 mg/dL — ABNORMAL HIGH (ref 34–200)

## 2017-06-14 LAB — APTT: APTT: 50 s — AB (ref 24–36)

## 2017-06-14 MED ORDER — CYANOCOBALAMIN 1000 MCG/ML IJ SOLN: 1000.0000 ug | Freq: Every day | INTRAMUSCULAR | Status: DC

## 2017-06-14 MED ORDER — PREDNISONE 10 MG PO TABS
15.0000 mg | ORAL_TABLET | Freq: Every day | ORAL | Status: DC
  Administered 2017-06-15 – 2017-06-17 (×3): 15 mg
  Filled 2017-06-14 (×3): qty 2

## 2017-06-14 MED ORDER — FUROSEMIDE 40 MG PO TABS
40.0000 mg | ORAL_TABLET | Freq: Every day | ORAL | Status: DC
  Administered 2017-06-15 – 2017-06-26 (×12): 40 mg via ORAL
  Filled 2017-06-14 (×12): qty 1

## 2017-06-14 MED ORDER — HALOPERIDOL LACTATE 5 MG/ML IJ SOLN
1.0000 mg | Freq: Once | INTRAMUSCULAR | Status: AC
  Administered 2017-06-14: 1 mg via INTRAVENOUS
  Filled 2017-06-14: qty 1

## 2017-06-14 MED ORDER — BISOPROLOL FUMARATE 5 MG PO TABS
2.5000 mg | ORAL_TABLET | Freq: Every day | ORAL | Status: DC
  Administered 2017-06-14: 2.5 mg
  Filled 2017-06-14: qty 1

## 2017-06-14 MED ORDER — ACETAMINOPHEN 10 MG/ML IV SOLN
1000.0000 mg | Freq: Four times a day (QID) | INTRAVENOUS | Status: AC
  Administered 2017-06-14: 1000 mg via INTRAVENOUS
  Filled 2017-06-14: qty 100

## 2017-06-14 MED ORDER — CARVEDILOL 3.125 MG PO TABS
3.1250 mg | ORAL_TABLET | Freq: Two times a day (BID) | ORAL | Status: DC
  Administered 2017-06-14: 3.125 mg
  Filled 2017-06-14: qty 1

## 2017-06-14 MED ORDER — SODIUM CHLORIDE 0.9 % IV SOLN
0.1000 mg/kg/h | INTRAVENOUS | Status: DC
  Administered 2017-06-14 – 2017-06-15 (×2): 0.1 mg/kg/h via INTRAVENOUS
  Administered 2017-06-16: 1.75 mg/kg/h via INTRAVENOUS
  Filled 2017-06-14 (×2): qty 250

## 2017-06-14 MED ORDER — FOLIC ACID 1 MG PO TABS
1.0000 mg | ORAL_TABLET | Freq: Every day | ORAL | Status: DC
  Administered 2017-06-14 – 2017-06-22 (×9): 1 mg
  Filled 2017-06-14 (×10): qty 1

## 2017-06-14 MED ORDER — CYANOCOBALAMIN 1000 MCG/ML IJ SOLN
1000.0000 ug | Freq: Every day | INTRAMUSCULAR | Status: DC
  Administered 2017-06-15 – 2017-06-26 (×12): 1000 ug via SUBCUTANEOUS
  Filled 2017-06-14 (×13): qty 1

## 2017-06-14 MED ORDER — DIPHENHYDRAMINE HCL 50 MG/ML IJ SOLN
25.0000 mg | Freq: Once | INTRAMUSCULAR | Status: AC
  Administered 2017-06-14: 25 mg via INTRAVENOUS
  Filled 2017-06-14: qty 1

## 2017-06-14 NOTE — Progress Notes (Signed)
Patient unable to tolerate chest pt. Pt becomes agitated and anxious.

## 2017-06-14 NOTE — Progress Notes (Signed)
PULMONARY / CRITICAL CARE MEDICINE   Name: Kelli Velasquez MRN: [id number] DOB: 18-Nov-1940    ADMISSION DATE:  05/26/2017  REFERRING MD:  Dr. Wyvonnia Dusky  CHIEF COMPLAINT:  SOB  HISTORY OF PRESENT ILLNESS:   76 yo female former smoker presented with progressive dyspnea, cough, wheeze, and edema.  Required intubation for respiratory failure from CHF, A fib and MSSA PNA. 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 Melvyn Novas).  SUBJECTIVE:  Restless night last night.Resting well this morning. Received 2 doses of haldol last night. Per the nurse, "she wore herself out".  VITAL SIGNS: BP 140/61   Pulse 99   Temp 98.5 F (36.9 C) (Oral)   Resp (!) 34   Ht _0  (1.626 m)   Wt 236 lb 12.4 oz (107.4 kg)   LMP 07/27/1980   SpO2 96%   BMI 40.64 kg/m   INTAKE / OUTPUT: I/O last 3 completed shifts: In: 3200 [I.V.:870; NG/GT:2230; IV Piggyback:100] Out: 3125 [Urine:3125]  PHYSICAL EXAMINATION:  General - Asleep, answering questions when awake Eyes - PERRLA ENT - Feeding tube in place, thick neck Cardiac - S1, S2,  Irregular,No RMG Chest - no wheeze, few rales per bases, coarse  Abd - soft, non tender, obese Ext - 1+ edema Skin - no rashes, no lesions, warm and dry Neuro - follows commands , MAE x 4   LABS:  BMET Recent Labs  Lab 06/13/17 0600 06/13/17 1520 06/14/17 0500  NA 154* 151* 147*  K 3.7 4.1 3.5  CL 109 104 101  CO2 39* 39* 39*  BUN 40* 37* 35*  CREATININE 0.81 0.86 0.74  GLUCOSE 200* 203* 154*    Electrolytes Recent Labs  Lab 06/08/17 0423  06/12/17 0327 06/13/17 0600 06/13/17 1520 06/14/17 0500  CALCIUM 9.3   < > 8.9 9.2 9.2 8.8*  MG 2.4  --  2.5*  --   --   --   PHOS 2.9  --   --   --   --   --    < > = values in this interval not displayed.    CBC Recent Labs  Lab 06/12/17 0327 06/13/17 1520 06/14/17 0500  WBC 18.5* 16.7* 15.6*  HGB 11.2* 11.2* 10.3*  HCT 39.2 39.1 37.0  PLT 78* 83* 85*    Liver  Enzymes Recent Labs  Lab 06/08/17 0423 06/13/17 1520  AST  --  25  ALT  --  33  ALKPHOS  --  58  BILITOT  --  0.9  ALBUMIN 2.7* 2.8*    Glucose Recent Labs  Lab 06/13/17 0755 06/13/17 1314 06/13/17 1553 06/13/17 1953 06/13/17 2328 06/14/17 0314  GLUCAP 162* 175* 195* 259* 97 98    Imaging Dg Chest Port 1 View  Result Date: 06/14/2017 CLINICAL DATA:  Respiratory failure EXAM: PORTABLE CHEST 1 VIEW COMPARISON:  Yesterday FINDINGS: Right arm PICC and feeding tube are stable. Cardiomegaly is stable. Bibasilar pulmonary opacity is stable. Pulmonary vascularity is within normal limits. IMPRESSION: Stable bibasilar atelectasis. Electronically Signed   By: Marybelle Killings M.D.   On: 06/14/2017 07:36   Dg Chest Port 1 View  Result Date: 06/13/2017 CLINICAL DATA:  Shortness of breath. EXAM: PORTABLE CHEST 1 VIEW COMPARISON:  Chest x-rays dated 06/11/2017 and 06/10/2017. FINDINGS: Enteric tube appears well positioned with tip at the level of the pylorus/duodenal bulb. Right-sided PICC line appears appropriately positioned with tip at the level of the lower SVC/ cavoatrial junction. Study  is hypoinspiratory with crowding of the perihilar and bibasilar bronchovascular markings. Persistent opacity at the left lung base, probable small pleural effusion and/or atelectasis. No new lung findings. No pneumothorax seen. Atherosclerotic changes again noted at the aortic arch. IMPRESSION: 1. No interval change. Persistent opacity at the left lung base, likely small left pleural effusion and/or atelectasis. Pneumonia not excluded if febrile. 2. Stable cardiomegaly. 3. Aortic atherosclerosis. Electronically Signed   By: Franki Cabot M.D.   On: 06/13/2017 14:37    STUDIES:  Echo 10/31>>> EF 35-40%, diffuse hypokinesis CT chest 11/7 read pending > RLL consolidation, no appreciable effusion    CULTURES: BAL AFB 11/07 >> BAL PCP 11/07 >> negative BAL fungal 11/07 >>  BAL 11/07 >> oral flora Sputum  11/07 >> MSSA Blood 11/07 >> negative  ANTIBIOTICS: Tressie Ellis 11/05 >> 11/10 Vancomycin 11/05 >> 11/07 Rocephin 11/10 >> 11/16  SIGNIFICANT EVENTS: 10/31 Admit 11/04 extubate 11/05 transfer SDU 11/07 transfer ICU reintubated. 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  ASSESSMENT / PLAN:  Acute hypoxic respiratory failure. No wheezing upon exam In NAD - oxygen to keep SpO2 90 to 95% - Continue nebs as scheduled - Mobilize as able - Pulmonary Toilet as able  Presumed sleep disordered breathing. - Bipap qhs and prn during the day  For Bilateral lower extremity dopplers today 11/19 to evaluate for DVT.   MSSA PNA. T max 99.1 - Completed ABX 11/16 - Monitor fever curve and WBC - Culture if clinically indicated  Hx of BOOP. - consider changing  prednisone to 15 mg daily on 11/19 as recommended on 11/15 per Dr. Halford Chessman  Acute on chronic combined CHF. A fib with RVR. CAD, HLD. - per cardiology - defer addition of anticoagulation to cardiology and primary team   DVT prophylaxis - SCDs SUP - protonix Nutrition - tube feeds Goals of care - DNR/DNI  PCCM will see twice weekly for pulmonary issues. Rest per primary team.  Husband and daughter updated at bedside 11/19.  Magdalen Spatz, AGACNP-BC [place] [facility] 06/14/2017, 9:30 AM Pager:  : [phone]  STAFF NOTE: Linwood Dibbles, MD FACP have personally reviewed patient's available data, including medical history, events of note, physical examination and test results as part of my evaluation. I have discussed with resident/NP and other care providers such as pharmacist, RN and RRT. In addition, I personally evaluated patient and  elicited key findings of: awake, less agitated per family, jvd down, no overt distress, jvd down, anterior coarse but no crackles, obese, edema remains lower ext, she was neg 2 liters last 24 hours and has improvement in resp status, pcxr which I reviewed shows int changes, with smal lung volumes, no lobar process, she has delirium and received haldol yesterday, would avoid further haldol unless used purely as sedative, maintain lasix to neg balance, and follow renal fxn, this appears to be PNA and sys Acute on diastolic CHF and no BOOP flare, cosnider slight reduction pred further in setting encephalaopthy, BIPAP is not needed and e should change to PRB and then dc from room if not needed prn next 24 hours, I updated pt husband and daughter   Lavon Paganini. Titus Mould, MD, [place] Pgr: [facility] 06/14/2017 11:57  AM    

## 2017-06-14 NOTE — Progress Notes (Signed)
   06/14/17 1200  Clinical Encounter Type  Visited With Patient and family together  Visit Type Initial;Spiritual support  Referral From Chaplain  Consult/Referral To Chaplain  Spiritual Encounters  Spiritual Needs Prayer  Stress Factors  Patient Stress Factors None identified  Family Stress Factors None identified   Had visit with patient-noticed she has been here since 10/31 and wanted to offer support to her and family. Had prayer with them. Conard Novak, Chaplain

## 2017-06-14 NOTE — Progress Notes (Signed)
[facility] Progress Note  Patient: Kelli Velasquez TRV:[id number]   PCP: Abner Greenspan, MD DOB: 03-26-1941   DOA: 05/26/2017   DOS: 06/14/2017   Date of Service: the patient was seen and examined on 06/14/2017  Subjective: No acute events. No further agitation.   Brief hospital course: Pt. with PMH of CHF, A. fib, CAD, HTN, BOOP; admitted on 05/26/2017, presented with complaint of shortness of breath, was found to have MSSA pneumonia with acute respiratory failure.  Patient was admitted in the ICU with intubation and extubated on 05/30/2017.  Followed by patient had another episode of respiratory distress requiring repeat intubation on 06/02/2017.  Currently extubated on 06/07/2017 and transferred to stepdown unit. After repeat extubation patient remains aphonic. Bronchoscopy performed on 06/02/2017, cultures so far are negative.  Sputum culture is growing MSSA patient completed antibiotic course on 06/11/2017. Also found to have acute systolic CHF, cardiology consulted and currently on IV Lasix recommend inpatient cardiac cath. Currently further plan is continue monitoring for improvement in voice quality as well as further cardiac workup.  Assessment and Plan: 1.  Acute hypoxic respiratory failure. Multifactorial. Currently on nasal cannula.  Continue BiPAP nightly and as needed. Tolerating it better. Continue respiratory care. CCM following. ABG earlier this morning shows hypoxia despite being on 50% FiO2 but currently on 6 L of nasal cannula and 100% saturation. D-dimer is elevated, negative lower extremity Doppler. Positive upper ext doppler in right arm with PICC line. Discussed with Dr Mohamed-hematology/oncology on call. Recommended to start the pt on non heparin anticoagulation, pharmacy recommended bivaluridine. Given pt's needs for cardiac cath on Wednesday.  Would like to continue chest PT with bed to improve mucus clearance.  2.  Presumed sleep disordered breathing. BiPAP  nightly for now will probably continue on discharge. Follow-up on pulmonary recommendation for the same.  3.  MSSA pneumonia. Antibiotic course completed. Monitor.  4.  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 Dose changed to 15 mg daily.  We will continue that dose during the hospitalization.  5.  Acute on chronic combined CHF. A. fib with RVR. CAD and HLD. Frequent SVTs Cardiology consulted. Continued on IV Lasix. CVP monitor. Cardiology considering right and left heart cath. Echocardiogram shows decreased EF of 35%. Continue to monitor on the telemetry. Need beta blocker, will start on bisoprolol per cards recommendation   6.  Dysphagia. Aphonia. Speech therapy following. Likely injury from intubation.  7.  Type 2 diabetes mellitus. Continue sliding scale insulin. And lantus  8.  Protein calorie malnutrition in the acute setting due to poor p.o. intake. Continue tube feeding for now.  9.  Acute encephalopathy. Likely multifactorial with deconditioning as well as recurrent intubation causing delirium along with hypercarbia. CT scan MRI brain negative for any acute intracranial abnormality. Minimize narcotics minimize sedating medication.  Monitor. Patient is hard of hearing, need to verify every time if there is a change in mental status whether that is because of patient's poor hearing.  Diet: On tube feeding via cor trac DVT Prophylaxis: subcutaneous Heparin  Advance goals of care discussion: DNR DNI  Family Communication: no family was present at bedside, at the time of interview.  Discussed with patient's husband on 06/12/2017  Disposition:  Discharge to be determined.   Consultants: CCM, cardiology Procedures: Echocardiogram, intubation , bronchoscopy  Antibiotics: Anti-infectives (From admission, onward)   Start     Dose/Rate Route Frequency Ordered Stop   06/05/17 2200  cefTRIAXone (ROCEPHIN) 1 g in dextrose 5 % 50  mL IVPB     1 g 100 mL/hr over 30  Minutes Intravenous Every 24 hours 06/05/17 1442 06/11/17 2359   06/03/17 0400  vancomycin (VANCOCIN) 1,250 mg in sodium chloride 0.9 % 250 mL IVPB  Status:  Discontinued     1,250 mg 166.7 mL/hr over 90 Minutes Intravenous Every 24 hours 06/02/17 0343 06/03/17 0919   06/02/17 0345  cefTAZidime (FORTAZ) 2 g in dextrose 5 % 50 mL IVPB  Status:  Discontinued     2 g 100 mL/hr over 30 Minutes Intravenous Every 12 hours 06/02/17 0343 06/05/17 1442   06/02/17 0345  vancomycin (VANCOCIN) 2,000 mg in sodium chloride 0.9 % 500 mL IVPB     2,000 mg 250 mL/hr over 120 Minutes Intravenous  Once 06/02/17 0343 06/02/17 0752   05/28/17 2000  fluconazole (DIFLUCAN) IVPB 100 mg     100 mg 50 mL/hr over 60 Minutes Intravenous  Once 05/28/17 1933 05/28/17 2119       Objective: Physical Exam: Vitals:   06/14/17 1425 06/14/17 1500 06/14/17 1555 06/14/17 1653  BP: (!) 117/48 135/72 135/72 125/69  Pulse: 89 92 94   Resp: (!) 22 (!) 30 (!) 34   Temp:   98.4 F (36.9 C)   TempSrc:   Oral   SpO2: 100% 100% 100%   Weight:      Height:        Intake/Output Summary (Last 24 hours) at 06/14/2017 1738 Last data filed at 06/14/2017 1556 Gross per 24 hour  Intake 1520 ml  Output 2150 ml  Net -630 ml   Filed Weights   06/12/17 0427 06/13/17 0402 06/14/17 0500  Weight: 107.6 kg (237 lb 3.4 oz) 107.8 kg (237 lb 10.5 oz) 107.4 kg (236 lb 12.4 oz)   General: Alert, Awake and Oriented to Person. Appear in moderate distress, affect appropriate Eyes: PERRL, Conjunctiva normal ENT: Oral Mucosa clear moist. Neck: no JVD, no Abnormal Mass Or lumps Cardiovascular: S1 and S2 Present, no Murmur, Peripheral Pulses Present Respiratory: increased respiratory effort, Bilateral Air entry equal and Decreased, no use of accessory muscle, bilateral Crackles, Occasional wheezes Abdomen: Bowel Sound present, Soft and no tenderness, no hernia Skin: no redness, no Rash, no induration Extremities: no Pedal edema, no calf  tenderness Neurologic: Grossly no focal neuro deficit. Bilaterally Equal motor strength  Data Reviewed: CBC: Recent Labs  Lab 06/08/17 0423  06/11/17 0449 06/11/17 2304 06/12/17 0327 06/13/17 1520 06/14/17 0500  WBC 32.4*   < > 18.1* 17.5* 18.5* 16.7* 15.6*  NEUTROABS 29.8*  --   --  15.8* 16.2* 15.5* 12.7*  HGB 13.0   < > 12.1 11.6* 11.2* 11.2* 10.3*  HCT 44.8   < > 42.1 40.9 39.2 39.1 37.0  MCV 102.3*   < > 103.4* 103.3* 102.6* 104.3* 103.1*  PLT 140*   < > 90* 90* 78* 83* 85*   < > = values in this interval not displayed.   Basic Metabolic Panel: Recent Labs  Lab 06/08/17 0423  06/11/17 2304 06/12/17 0327 06/13/17 0600 06/13/17 1520 06/14/17 0500  NA 150*   < > 150* 153* 154* 151* 147*  K 3.8   < > 3.7 3.6 3.7 4.1 3.5  CL 112*   < > 106 108 109 104 101  CO2 31   < > 37* 38* 39* 39* 39*  GLUCOSE 94   < > 127* 85 200* 203* 154*  BUN 33*   < > 40* 43* 40* 37* 35*  CREATININE 0.71   < > 0.88 0.79 0.81 0.86 0.74  CALCIUM 9.3   < > 9.2 8.9 9.2 9.2 8.8*  MG 2.4  --   --  2.5*  --   --   --   PHOS 2.9  --   --   --   --   --   --    < > = values in this interval not displayed.    Liver Function Tests: Recent Labs  Lab 06/08/17 0423 06/13/17 1520  AST  --  25  ALT  --  33  ALKPHOS  --  58  BILITOT  --  0.9  PROT  --  5.5*  ALBUMIN 2.7* 2.8*   No results for input(s): LIPASE, AMYLASE in the last 168 hours. No results for input(s): AMMONIA in the last 168 hours. Coagulation Profile: No results for input(s): INR, PROTIME in the last 168 hours. Cardiac Enzymes: Recent Labs  Lab 06/12/17 0327 06/12/17 0748 06/12/17 1702  TROPONINI 0.07* 0.07* 0.07*   BNP (last 3 results) Recent Labs    01/11/17 1053  PROBNP 264.0*   CBG: Recent Labs  Lab 06/13/17 1953 06/13/17 2328 06/14/17 0314 06/14/17 1312 06/14/17 1553  GLUCAP 259* 97 98 173* 179*   Studies: Dg Chest Port 1 View  Result Date: 06/14/2017 CLINICAL DATA:  Respiratory failure EXAM: PORTABLE  CHEST 1 VIEW COMPARISON:  Yesterday FINDINGS: Right arm PICC and feeding tube are stable. Cardiomegaly is stable. Bibasilar pulmonary opacity is stable. Pulmonary vascularity is within normal limits. IMPRESSION: Stable bibasilar atelectasis. Electronically Signed   By: Marybelle Killings M.D.   On: 06/14/2017 07:36    Scheduled Meds: . aspirin  81 mg Per Tube Daily  . bisoprolol  2.5 mg Per Tube Daily  . chlorhexidine gluconate (MEDLINE KIT)  15 mL Mouth Rinse BID  . Chlorhexidine Gluconate Cloth  6 each Topical Q0600  . cyanocobalamin  1,000 mcg Subcutaneous QHS  . ezetimibe  10 mg Per Tube QHS  . feeding supplement (PRO-STAT SUGAR FREE 64)  30 mL Per Tube TID  . folic acid  1 mg Per Tube Daily  . free water  300 mL Per Tube Q8H  . [START ON 06/15/2017] furosemide  40 mg Oral Daily  . hydrALAZINE  50 mg Per Tube Q8H   Or  . hydrALAZINE  10 mg Intravenous Q8H  . insulin aspart  0-20 Units Subcutaneous Q4H  . insulin glargine  26 Units Subcutaneous Daily  . ipratropium-albuterol  3 mL Nebulization Q6H  . isosorbide dinitrate  20 mg Per Tube TID  . mouth rinse  15 mL Mouth Rinse QID  . montelukast  10 mg Per Tube QHS  . pantoprazole sodium  40 mg Per Tube Daily  . [START ON 06/15/2017] predniSONE  15 mg Per Tube Q breakfast  . sodium chloride flush  10-40 mL Intracatheter Q12H  . spironolactone  12.5 mg Per Tube Daily   Continuous Infusions: . sodium chloride 250 mL (06/11/17 1400)  . feeding supplement (JEVITY 1.2 CAL) 1,000 mL (06/14/17 1712)   PRN Meds: sodium chloride, albuterol, haloperidol lactate, hydrALAZINE, metoprolol tartrate, sodium chloride flush  Time spent: 35 minutes  Author: Berle Mull, MD [facility] Pager: [phone] 06/14/2017 5:38 PM  If 7PM-7AM, please contact night-coverage at [email or web address], password [facility]

## 2017-06-14 NOTE — Progress Notes (Signed)
SLP Cancellation Note  Patient Details Name: Kelli Velasquez MRN: [id number] DOB: 1940-08-07   Cancelled treatment:       Reason Eval/Treat Not Completed: Fatigue/lethargy limiting ability to participate. Pt up most of the night and now resting per RN. Will re-attempt later when she may be more alert and appropriate for PO trials.    [name] 06/14/2017, 10:13 AM  [name], M.A. CCC-SLP [phone]

## 2017-06-14 NOTE — Progress Notes (Signed)
Inpatient Diabetes Program Recommendations  AACE/ADA: New Consensus Statement on Inpatient Glycemic Control (2015)  Target Ranges:  Prepandial:   less than 140 mg/dL      Peak postprandial:   less than 180 mg/dL (1-2 hours)      Critically ill patients:  140 - 180 mg/dL  Results for XIOMARA, SEVILLANO (MRN [id number]) as of 06/14/2017 09:54  Ref. Range 06/14/2017 05:00  Glucose Latest Ref Range: 65 - 99 mg/dL 154 (H)   Results for SHAKEENA, KAFER (MRN [id number]) as of 06/14/2017 09:54  Ref. Range 06/13/2017 07:55 06/13/2017 13:14 06/13/2017 15:53 06/13/2017 19:53 06/13/2017 23:28 06/14/2017 03:14  Glucose-Capillary Latest Ref Range: 65 - 99 mg/dL 162 (H) 175 (H) 195 (H) 259 (H) 97 98    Review of Glycemic Control  Current orders for Inpatient glycemic control: Lantus 26 units QHS, Novolog 0-20 units Q4H; Prednisone 20 QAM; Jevity @55  ml/hr  Inpatient Diabetes Program Recommendations: Insulin - Meal Coverage: Please consider ordering Novolog 2 units Q4H for tube feeding coverage. Novolog tube feeding coverage would be held or stopped if tube feeding is held or stopped.  Thanks, Barnie Alderman, RN, MSN, CDE Diabetes Coordinator Inpatient Diabetes Program [phone] (Team Pager from 8am to 5pm)

## 2017-06-14 NOTE — Progress Notes (Signed)
ANTICOAGULATION CONSULT NOTE - Follow Up Consult  Pharmacy Consult for Bivairudin Indication: DVT  Allergies  Allergen Reactions  . Fish Allergy Anaphylaxis  . Shellfish Allergy Anaphylaxis  . Aspirin     REACTION: nausea and vomiting High doses  . Atorvastatin     REACTION: leg pain  . Crestor [Rosuvastatin Calcium] Other (See Comments)    Muscle pain - allergy/intolerance  . Penicillins     Due to mold allergy per pt  . Simvastatin     REACTION: muscle pain  . Trandolapril     REACTION: leg pain    Patient Measurements: Height: 5\' 4"  (162.6 cm) Weight: 236 lb 12.4 oz (107.4 kg) IBW/kg (Calculated) : 54.7   Vital Signs: Temp: 98.4 F (36.9 C) (11/19 1555) Temp Source: Oral (11/19 1555) BP: 125/69 (11/19 1653) Pulse Rate: 94 (11/19 1555)  Labs: Recent Labs    06/12/17 0327 06/12/17 0748 06/12/17 1702 06/13/17 0600 06/13/17 1520 06/14/17 0500  HGB 11.2*  --   --   --  11.2* 10.3*  HCT 39.2  --   --   --  39.1 37.0  PLT 78*  --   --   --  83* 85*  CREATININE 0.79  --   --  0.81 0.86 0.74  TROPONINI 0.07* 0.07* 0.07*  --   --   --     Estimated Creatinine Clearance: 70.5 mL/min (by C-G formula based on SCr of 0.74 mg/dL).   Medical History: Past Medical History:  Diagnosis Date  . Allergy    allergic rhinitis  . Asthma   . Chronic bronchitis ([place])   . Coronary artery disease    cath January 2011 with DEs LAD and RCA  . Dyspnea   . Full dentures   . Gallstones   . GERD (gastroesophageal reflux disease)   . History of echocardiogram    Echo 5/17: mod LVH, EF 50-55%, ant-septal HK, Gr 1 DD, mod LAE //  b.  Echo 7/17: mild concentric LVH, EF 45-50%, inf-lat, inf, inf-septal HK, mild LAE  . History of nuclear stress test    Myoview 7/17: EF 48%, small mild apical defect, no ischemia, low risk  . Hyperlipidemia   . Hypertension   . 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 Myopia   . Neoplasm of skin     neoplasm of uncertain behavior of skin  . Nocturnal oxygen desaturation    o2 at night  . Obesity   . Osteoarthritis    knees, fingers, shoulders  . Overactive bladder   . Oxygen deficiency    uses oxygen all day  . Rash    and other non specific skin eruptions  . Retaining fluid    in ankles and feet  . Urinary incontinence   . Vertigo   . Wears glasses       Assessment: 76yof admitted with respiratory distress, s/p VDRF now improved.  She had drop in PLTC while on heparin sq for VTE px and have remained low despite off heparin.  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  New VTE at PICC line tip  - now needs anticoagulation - recommended by Hematology to use a DTI v heparin.   Will start bivalirudin.  BL INR 1.1 11/7, h/h stable 10/37, pltc 85.  No bleeding noted.  Goal of Therapy:  aPTT 50-85 sec seconds Monitor platelets by anticoagulation protocol: Yes   Plan:  Begin bivalirudin drip rate 0.1mg /kg/hr  Check aPTT 4hr after start Daily aPTT and CBC  Bonnita Nasuti Pharm.D. CPP, BCPS Clinical Pharmacist [phone] 06/14/2017 5:58 PM

## 2017-06-14 NOTE — Progress Notes (Signed)
  Speech Language Pathology Treatment: Dysphagia  Patient Details Name: Kelli Velasquez MRN: [id number] DOB: [birth year] Today's Date: 06/14/2017 Time: 0940-7680 SLP Time Calculation (min) (ACUTE ONLY): 19 min  Assessment / Plan / Recommendation Clinical Impression  Pt was aphonic at baseline, although without audible congestion as previously observed. Pt performed oral care with with only a small amount of thick secretions removed from her oral cavity. SLP administered advanced trials of ice chips without overt signs of intolerance, after which she was able to produce some very soft, very hoarse phonation. Given pt's reintubation once already this admission, I think a FEES is warranted prior to providing more advanced PO trials as pt is likely more sensitive to aspiration events. She also likely is not getting sufficient laryngeal closure as her cough and voicing is so impaired. A FEES would at least be able to give [place] some diagnostic information to better assess her current oropharyngeal function and ability to tolerate at least therapeutic POs. Recommend that she remain NPO except for a few pieces of ice, given by RN after oral care, pending repeat FEES on next date.   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 She had significant respiratory distress in ER and failed trial bipap requiring intubation 10/31-11/4. FEES 11/6 showed severe edema and weakness, standing secretions, and diffuse residue that could not be cleared. Recommendations at that time were to remain NPO except for a few ice chips given after oral care. Pt was reintubated 11/7-11/12 and stayed on BiPAP post-extubation. SLP was consulted to reassess swallowing.      SLP Plan  Other (Comment)(FEES)       Recommendations  Diet recommendations: NPO;Other(comment)(few ice chips given by RN after  oral care) Medication Administration: Via alternative means                Oral Care Recommendations: Oral care QID Follow up Recommendations: Skilled Nursing facility SLP Visit Diagnosis: Dysphagia, oropharyngeal phase (R13.12) Plan: Other (Comment)(FEES)       GO                [name] 06/14/2017, 4:49 PM  [name], M.A. CCC-SLP [phone]

## 2017-06-14 NOTE — Care Management Important Message (Signed)
Important Message  Patient Details  Name: Kelli Velasquez MRN: [id number] Date of Birth: 1941/02/11   Medicare Important Message Given:  Yes    Nathen May 06/14/2017, 9:38 AM

## 2017-06-14 NOTE — Progress Notes (Signed)
*  PRELIMINARY RESULTS* Vascular Ultrasound Bilateral Upper Extremity Venous Duplex- Preliminary findings: This was a limited study. Non occlusive deep vein thrombosis noted in the right axillary vein. Superficial vein thrombosis noted around the PICC line in the right basilic vein, short segment of SVT noted in the forearm within the cephalic vein.  Somewhat limited visualization of vessels due to large PICC bandage.  The left upper extremity appears negative for deep and superficial vein thrombosis. Limited visualization of forearm vessels due to large bandage.   Bilateral lower extremity venous duplex, No evidence of deep vein thrombosis or baker's cysts bilaterally.  Preliminary results called to Dr. Posey Pronto @ 16:20.  Everrett Coombe 06/14/2017, 4:18 PM

## 2017-06-14 NOTE — Progress Notes (Signed)
CSW provided pt spouse and dtr with list of current bed offers- might be interested in [place] due to proximity to their home  CSW will continue to follow  Jorge Ny, [place] Social Worker [phone]

## 2017-06-14 NOTE — Progress Notes (Signed)
Physical Therapy Treatment Patient Details Name: Kelli Velasquez MRN: [id number] DOB: [birth year] Today's Date: 06/14/2017    History of Present Illness 76 yo female admitted for progressive respiratory distress.  She then developed altered mental status.  Pt has a past medical history including Allergy, Asthma, Chronic bronchitis, CAD, Dyspnea, GERD, Hyperlipidemia, Hypertension, Myocardial infarction, Myopia, Neoplasm of skin, Nocturnal oxygen desaturation, Obesity, Osteoarthritis, Oxygen deficiency,  Urinary incontinence, and Vertigo. Required intubation 10/31-11/4 and  11/6-11/14, now off Bipap.    PT Comments    Pt admitted with above diagnosis. Pt currently with functional limitations due to balance and endurance deficits. Pt was able to stand with RW and take pivotal steps to chair with only min assist.  Pt with much better strength today.   Pt will benefit from skilled PT to increase their independence and safety with mobility to allow discharge to the venue listed below.     Follow Up Recommendations  SNF;Supervision/Assistance - 24 hour     Equipment Recommendations  None recommended by PT;Other (comment)(defer to next venue)    Recommendations for Other Services       Precautions / Restrictions Precautions Precautions: Fall Precaution Comments: watch O2 saturations and HR Restrictions Weight Bearing Restrictions: No    Mobility  Bed Mobility Overal bed mobility: Needs Assistance Bed Mobility: Supine to Sit;Sit to Supine Rolling: Supervision   Supine to sit: Mod assist;HOB elevated;Max assist;+2 for physical assistance Sit to supine: Mod assist;+2 for physical assistance   General bed mobility comments: cueing for sequencing and to maintain attention to task; assist to elevate trunk and position hips at EOB  Transfers Overall transfer level: Needs assistance Equipment used: Rolling walker (2 wheeled) Transfers: Sit to/from Stand;Lateral/Scoot Transfers Sit to  Stand: From elevated surface;Mod assist;+2 physical assistance Stand pivot transfers: +2 physical assistance;Min assist       General transfer comment: Pt needed assist to power up but once up took pivotal steps with RW with guidance by PT moving RW.   Ambulation/Gait                 Stairs            Wheelchair Mobility    Modified Rankin (Stroke Patients Only)       Balance Overall balance assessment: Needs assistance Sitting-balance support: Bilateral upper extremity supported;Feet supported Sitting balance-Leahy Scale: Fair Sitting balance - Comments: No problems sitting EOB today.    Standing balance support: During functional activity;Bilateral upper extremity supported Standing balance-Leahy Scale: Poor Standing balance comment: relies on UE support and external support.                             Cognition Arousal/Alertness: Awake/alert Behavior During Therapy: WFL for tasks assessed/performed Overall Cognitive Status: Difficult to assess                                        Exercises General Exercises - Lower Extremity Long Arc Quad: AROM;Both;10 reps;Seated Hip Flexion/Marching: AROM;Both;10 reps;Seated    General Comments        Pertinent Vitals/Pain Pain Assessment: No/denies pain  VSS  Home Living                      Prior Function            PT Goals (current goals can  now be found in the care plan section) Acute Rehab PT Goals Patient Stated Goal: go home Progress towards PT goals: Progressing toward goals    Frequency    Min 3X/week      PT Plan Current plan remains appropriate    Co-evaluation              AM-PAC PT "6 Clicks" Daily Activity  Outcome Measure  Difficulty turning over in bed (including adjusting bedclothes, sheets and blankets)?: Unable Difficulty moving from lying on back to sitting on the side of the bed? : Unable Difficulty sitting down on and  standing up from a chair with arms (e.g., wheelchair, bedside commode, etc,.)?: Unable Help needed moving to and from a bed to chair (including a wheelchair)?: A Lot Help needed walking in hospital room?: Total Help needed climbing 3-5 steps with a railing? : Total 6 Click Score: 7    End of Session Equipment Utilized During Treatment: Oxygen;Gait belt(3L of O2 via [place]) Activity Tolerance: Patient limited by fatigue Patient left: with call bell/phone within reach;with family/visitor present;in chair, mitts placed back on hands Nurse Communication: Mobility status;Need for lift equipment PT Visit Diagnosis: Unsteadiness on feet (R26.81);Difficulty in walking, not elsewhere classified (R26.2)     Time: 1531-1550 PT Time Calculation (min) (ACUTE ONLY): 19 min  Charges:  $Therapeutic Activity: 8-22 mins                    G Codes:       Alekai Pocock,PT Acute Rehabilitation [phone] (pager)    Denice Paradise 06/14/2017, 4:13 PM

## 2017-06-14 NOTE — Progress Notes (Signed)
Patient unable to tolerate chest PT. Pt becomes anxious and agitated. Treatment terminated at this time. Will continue to monitor respiratory status.

## 2017-06-14 NOTE — Progress Notes (Signed)
Patient ID: Kelli Velasquez, female   DOB: 1941-04-08, 76 y.o.   MRN: [id number]      Advanced Heart Failure Rounding Note  Subjective:    Over 2 weeks prior to admission, she had increased dyspnea with exertion, she was short of breath at rest on the day of admission.  Her husband says she has been taking 20 mg of lasix daily and occasionally an extra 20 mg of lasix.   Presented to [facility] ED with respiratory distress. Family reported increased dyspnea over the last week requiring 2 liters oxygen at home. In ED she received solumedrol and duonebs. Placed on Bipap but continue decline requiring intubation. CXR concerning for interstitial edema and bilateral effusions.  Extubated 11/4. Noted to have swelling of upper airways on speech/swallow evaluation 11/6.  Developed respiratory distress 11/6 in the evening with hypoxia and hypercapnia and was re-intubated.    Extubated 06/07/17.   Still not speaking much. Extremely hoarse. Denies SOB today. Per family having a "really good day". Did require 2 doses of haldol last night for agitation. Tmax 98.7.  LE/UE [place] ordered to r/o DVTs with edema.   CXR 06/09/17 with LLL PNA  Studies:  Echo 05/26/2017 EF 35-40%  Grade I DD RV normal Echo 2017 EF 45-50% with WMA Myoview 2017 No significant ischemia EF 48%.  [place] 2011- DES LAD and RCA.   Objective:   Weight Range: 236 lb 12.4 oz (107.4 kg) Body mass index is 40.64 kg/m.   Vital Signs:   Temp:  [97.7 F (36.5 C)-98.7 F (37.1 C)] 98.5 F (36.9 C) (11/19 0819) Pulse Rate:  [48-103] 99 (11/19 0819) Resp:  [18-34] 34 (11/19 0819) BP: (102-184)/(48-83) 140/61 (11/19 0819) SpO2:  [96 %-100 %] 96 % (11/19 0819) Weight:  [236 lb 12.4 oz (107.4 kg)] 236 lb 12.4 oz (107.4 kg) (11/19 0500) Last BM Date: 06/13/17  Weight change: Filed Weights   06/12/17 0427 06/13/17 0402 06/14/17 0500  Weight: 237 lb 3.4 oz (107.6 kg) 237 lb 10.5 oz (107.8 kg) 236 lb 12.4 oz (107.4 kg)    Intake/Output:   Intake/Output Summary (Last 24 hours) at 06/14/2017 1130 Last data filed at 06/14/2017 1120 Gross per 24 hour  Intake 1520 ml  Output 3450 ml  Net -1930 ml    Physical Exam   General: Chronically ill and elderly appearing. NAD.  HEENT: Normal Neck: Supple. JVP difficult to assess 2/2 body habitus. Carotids 2+ bilat; no bruits. No thyromegaly or nodule noted. Cor: PMI nondisplaced. RRR, No M/G/R noted Lungs: Diminished. On 3 L O2.  Abdomen: Soft, non-tender, non-distended, no HSM. No bruits or masses. +BS  Extremities: No cyanosis, clubbing, or rash. RUE PICC in place. Nothing connected. Trace ankle edema.  Neuro: Alert & orientedx3, cranial nerves grossly intact. moves all 4 extremities w/o difficulty. Affect pleasant    Telemetry   NSR/ ST with PVCs, 90-100s, Personally reviewed.   Labs    CBC Recent Labs    06/13/17 1520 06/14/17 0500  WBC 16.7* 15.6*  NEUTROABS 15.5* 12.7*  HGB 11.2* 10.3*  HCT 39.1 37.0  MCV 104.3* 103.1*  PLT 83* 85*   Basic Metabolic Panel Recent Labs    06/12/17 0327  06/13/17 1520 06/14/17 0500  NA 153*   < > 151* 147*  K 3.6   < > 4.1 3.5  CL 108   < > 104 101  CO2 38*   < > 39* 39*  GLUCOSE 85   < > 203* 154*  BUN 43*   < > 37* 35*  CREATININE 0.79   < > 0.86 0.74  CALCIUM 8.9   < > 9.2 8.8*  MG 2.5*  --   --   --    < > = values in this interval not displayed.   Liver Function Tests Recent Labs    06/13/17 1520  AST 25  ALT 33  ALKPHOS 58  BILITOT 0.9  PROT 5.5*  ALBUMIN 2.8*   No results for input(s): LIPASE, AMYLASE in the last 72 hours. Cardiac Enzymes Recent Labs    06/12/17 0327 06/12/17 0748 06/12/17 1702  TROPONINI 0.07* 0.07* 0.07*   BNP: BNP (last 3 results) Recent Labs    05/26/17 0610  BNP 760.9*   ProBNP (last 3 results) Recent Labs    01/11/17 1053  PROBNP 264.0*   D-Dimer Recent Labs    06/12/17 0327  DDIMER 2.79*   Hemoglobin A1C No results for input(s):  HGBA1C in the last 72 hours. Fasting Lipid Panel No results for input(s): CHOL, HDL, LDLCALC, TRIG, CHOLHDL, LDLDIRECT in the last 72 hours. Thyroid Function Tests No results for input(s): TSH, T4TOTAL, T3FREE, THYROIDAB in the last 72 hours.  Invalid input(s): FREET3  Other results:  Imaging   Dg Chest Port 1 View  Result Date: 06/14/2017 CLINICAL DATA:  Respiratory failure EXAM: PORTABLE CHEST 1 VIEW COMPARISON:  Yesterday FINDINGS: Right arm PICC and feeding tube are stable. Cardiomegaly is stable. Bibasilar pulmonary opacity is stable. Pulmonary vascularity is within normal limits. IMPRESSION: Stable bibasilar atelectasis. Electronically Signed   By: Marybelle Killings M.D.   On: 06/14/2017 07:36   Dg Chest Port 1 View  Result Date: 06/13/2017 CLINICAL DATA:  Shortness of breath. EXAM: PORTABLE CHEST 1 VIEW COMPARISON:  Chest x-rays dated 06/11/2017 and 06/10/2017. FINDINGS: Enteric tube appears well positioned with tip at the level of the pylorus/duodenal bulb. Right-sided PICC line appears appropriately positioned with tip at the level of the lower SVC/ cavoatrial junction. Study is hypoinspiratory with crowding of the perihilar and bibasilar bronchovascular markings. Persistent opacity at the left lung base, probable small pleural effusion and/or atelectasis. No new lung findings. No pneumothorax seen. Atherosclerotic changes again noted at the aortic arch. IMPRESSION: 1. No interval change. Persistent opacity at the left lung base, likely small left pleural effusion and/or atelectasis. Pneumonia not excluded if febrile. 2. Stable cardiomegaly. 3. Aortic atherosclerosis. Electronically Signed   By: Franki Cabot M.D.   On: 06/13/2017 14:37    Medications:     Scheduled Medications: . aspirin  81 mg Per Tube Daily  . carvedilol  3.125 mg Per Tube BID WC  . chlorhexidine gluconate (MEDLINE KIT)  15 mL Mouth Rinse BID  . Chlorhexidine Gluconate Cloth  6 each Topical Q0600  . ezetimibe   10 mg Per Tube QHS  . feeding supplement (PRO-STAT SUGAR FREE 64)  30 mL Per Tube TID  . free water  300 mL Per Tube Q8H  . furosemide  40 mg Intravenous Daily  . hydrALAZINE  50 mg Per Tube Q8H   Or  . hydrALAZINE  10 mg Intravenous Q8H  . insulin aspart  0-20 Units Subcutaneous Q4H  . insulin glargine  26 Units Subcutaneous Daily  . ipratropium-albuterol  3 mL Nebulization Q6H  . isosorbide dinitrate  20 mg Per Tube TID  . mouth rinse  15 mL Mouth Rinse QID  . montelukast  10 mg Per Tube QHS  . pantoprazole sodium  40 mg Per  Tube Daily  . predniSONE  20 mg Per Tube Q breakfast  . sodium chloride flush  10-40 mL Intracatheter Q12H  . spironolactone  12.5 mg Per Tube Daily    Infusions: . sodium chloride 250 mL (06/11/17 1400)  . 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  Admitted with acute/chronic respiratory failure  Assessment/Plan   1. Acute/chronic systolic CHF: Probably ischemic cardiomyopathy given prior history.  Echo this admission with EF down to 35-40% range, around 50% in past.  She was admitted with worsening dyspnea, elevated BNP, CXR with pulmonary edema. PCT not elevated.  Has history of BOOP.  MSSA PNA (LLL infiltrate).   CVP not connected.  - Volume status looks stable to improved. Can likely switch to po diuretics soon.  - She is off BiPAP and stable on O2 via [place]. Will discuss timing of R/LHC with Dr. Aundra Dubin  - Increased hydralazine/isordil .   - Continue spironolactone 12.5 daily.   - She has not tolerated beta blockers in the past due to wheezing, would consider starting low dose of beta-1 specific bisoprolol when respiratory status improved. Would not start  yet. - She is extubated and stable. Will discuss  - With fall in EF, would plan left/right heart cath once she is extubated and stable.  2. Acute hypoxemic respiratory failure: Initially thought multifactorial due to pulmonary edema, effusions, BOOP and OHS.  She is on IV Solumedrol given BOOP with ?component of exacerbation.  She was diuresed.  At baseline, she is on 2L home oxygen.  Extubated 11/4 but developed respiratory distress 11/6 and re-intubated.  She had had swelling of the upper airways noted by speech/swallow exam.  Found to have MSSA PNA.  She is now on ceftriaxone.  - Antibiotics/steroids per CCM.  No change.  3. CAD: h/o DES to LAD and RCA in 2011.  No chest pain but admitted with severe dyspnea/volume overload and fall in EF on echo.  Troponin mildly elevated but no trend. Suspect most likely demand ischemia from volume overload.  - Given fall in EF, would plan cath when creatinine/respiratory status stabilizes [facility]) => once off Bipap and respiratory status better.  Will discuss timing with Dr. Aundra Dubin.  4.  AKI:  - Resolved.    5. HTN: - Systolic pressure between 120-140. Slightly elevated this am, but for some reason getting IV hydralazine 75m occasionally instead of po every time.  Should stick with po now that she is tolerating.  - Continue hydralazine 50 mg TID.  6. Code status:  - DNR/DNI at this time.  7. NSVT:  - No recurrence.   [place] of [place]: [place] 06/14/2017, 11:30 AM  Advanced Heart Failure Team Pager [phone](M-F; 7a - 4p)  Please contact [facility] for night-coverage after hours (4p -7a ) and weekends on [email or web address]  Patient seen with PA, agree with the above note.  She is doing better.  Volume status looks ok, think we can transition to Lasix 40 mg po daily.    Will aim for right/left heart cath on Wednesday morning.   DLoralie Champagne11/19/2018 1:33 PM

## 2017-06-14 NOTE — Progress Notes (Deleted)
Initial visit with patient, husband and daughter. Has been in hospital since 10/31. Not able to communicate well with patient due to hearing . Had prayer with patient and family for her healing and for staff caring for her. Conard Novak, Chaplain

## 2017-06-15 ENCOUNTER — Inpatient Hospital Stay (HOSPITAL_COMMUNITY): Payer: Medicare Other

## 2017-06-15 LAB — CBC WITH DIFFERENTIAL/PLATELET
BASOS PCT: 0 %
Basophils Absolute: 0 10*3/uL (ref 0.0–0.1)
Eosinophils Absolute: 0.5 10*3/uL (ref 0.0–0.7)
Eosinophils Relative: 3 %
HEMATOCRIT: 37.5 % (ref 36.0–46.0)
Hemoglobin: 10.8 g/dL — ABNORMAL LOW (ref 12.0–15.0)
LYMPHS PCT: 16 %
Lymphs Abs: 2.3 10*3/uL (ref 0.7–4.0)
MCH: 29.3 pg (ref 26.0–34.0)
MCHC: 28.8 g/dL — AB (ref 30.0–36.0)
MCV: 101.6 fL — AB (ref 78.0–100.0)
MONO ABS: 0.6 10*3/uL (ref 0.1–1.0)
MONOS PCT: 4 %
NEUTROS ABS: 11.6 10*3/uL — AB (ref 1.7–7.7)
Neutrophils Relative %: 77 %
Platelets: 112 10*3/uL — ABNORMAL LOW (ref 150–400)
RBC: 3.69 MIL/uL — ABNORMAL LOW (ref 3.87–5.11)
RDW: 16.2 % — AB (ref 11.5–15.5)
WBC: 15.1 10*3/uL — ABNORMAL HIGH (ref 4.0–10.5)

## 2017-06-15 LAB — COMPREHENSIVE METABOLIC PANEL
ALT: 40 U/L (ref 14–54)
ANION GAP: 8 (ref 5–15)
AST: 25 U/L (ref 15–41)
Albumin: 2.6 g/dL — ABNORMAL LOW (ref 3.5–5.0)
Alkaline Phosphatase: 59 U/L (ref 38–126)
BILIRUBIN TOTAL: 0.8 mg/dL (ref 0.3–1.2)
BUN: 32 mg/dL — ABNORMAL HIGH (ref 6–20)
CALCIUM: 8.9 mg/dL (ref 8.9–10.3)
CO2: 37 mmol/L — ABNORMAL HIGH (ref 22–32)
Chloride: 104 mmol/L (ref 101–111)
Creatinine, Ser: 0.69 mg/dL (ref 0.44–1.00)
Glucose, Bld: 123 mg/dL — ABNORMAL HIGH (ref 65–99)
POTASSIUM: 3.6 mmol/L (ref 3.5–5.1)
Sodium: 149 mmol/L — ABNORMAL HIGH (ref 135–145)
TOTAL PROTEIN: 5.2 g/dL — AB (ref 6.5–8.1)

## 2017-06-15 LAB — GLUCOSE, CAPILLARY
GLUCOSE-CAPILLARY: 124 mg/dL — AB (ref 65–99)
GLUCOSE-CAPILLARY: 142 mg/dL — AB (ref 65–99)
Glucose-Capillary: 160 mg/dL — ABNORMAL HIGH (ref 65–99)
Glucose-Capillary: 107 mg/dL — ABNORMAL HIGH (ref 65–99)
Glucose-Capillary: 187 mg/dL — ABNORMAL HIGH (ref 65–99)
Glucose-Capillary: 217 mg/dL — ABNORMAL HIGH (ref 65–99)
Glucose-Capillary: 109 mg/dL — ABNORMAL HIGH (ref 65–99)

## 2017-06-15 LAB — VITAMIN B12: Vitamin B-12: 945 pg/mL — ABNORMAL HIGH (ref 180–914)

## 2017-06-15 LAB — APTT: APTT: 54 s — AB (ref 24–36)

## 2017-06-15 LAB — MAGNESIUM: Magnesium: 2.4 mg/dL (ref 1.7–2.4)

## 2017-06-15 LAB — LACTIC ACID, PLASMA: Lactic Acid, Venous: 1.1 mmol/L (ref 0.5–1.9)

## 2017-06-15 LAB — TSH: TSH: 1.452 u[IU]/mL (ref 0.350–4.500)

## 2017-06-15 MED ORDER — BISOPROLOL FUMARATE 5 MG PO TABS
5.0000 mg | ORAL_TABLET | Freq: Every day | ORAL | Status: DC
  Administered 2017-06-15 – 2017-06-17 (×2): 5 mg
  Filled 2017-06-15 (×3): qty 1

## 2017-06-15 MED ORDER — SODIUM CHLORIDE 0.9% FLUSH: 3.0000 mL | INTRAVENOUS | Status: DC | PRN

## 2017-06-15 MED ORDER — SODIUM CHLORIDE 0.9% FLUSH: 3.0000 mL | Freq: Two times a day (BID) | INTRAVENOUS | Status: DC

## 2017-06-15 MED ORDER — SPIRONOLACTONE 25 MG PO TABS
25.0000 mg | ORAL_TABLET | Freq: Every day | ORAL | Status: DC
  Administered 2017-06-15 – 2017-06-22 (×8): 25 mg
  Filled 2017-06-15 (×8): qty 1

## 2017-06-15 MED ORDER — SODIUM CHLORIDE 0.9 % IV SOLN
INTRAVENOUS | Status: DC
  Administered 2017-06-16: 06:00:00 via INTRAVENOUS

## 2017-06-15 MED ORDER — ASPIRIN 81 MG PO CHEW
81.0000 mg | CHEWABLE_TABLET | ORAL | Status: AC
  Administered 2017-06-16: 81 mg via ORAL
  Filled 2017-06-15: qty 1

## 2017-06-15 MED ORDER — SODIUM CHLORIDE 0.9 % IV SOLN: 250.0000 mL | INTRAVENOUS | Status: DC | PRN

## 2017-06-15 NOTE — Progress Notes (Signed)
DVT was found in R upper extremity near PICC,  This Rn did not feel comfortable using PICC. Baltazar Najjar NP was consulted about removal of PICC. Was told to wait for second opinion in AM. Obtained left upper arm peripheral IV.

## 2017-06-15 NOTE — Care Management Note (Signed)
Case Management Note  Patient Details  Name: NORVELLA LOSCALZO MRN: [id number] Date of Birth: 1940-11-29  Subjective/Objective:  11/19-  Transfer from ICU, acute resp failure, mssa pna, afib with rvr, acute chf, remains aphonic after repeat extubation, using bipap at night, receiving chest pt, completed abx on 11/16, receiving iv lasix, haldol, plan for cath on Wed.  Plan is for SNF once off bipap.                 Action/Plan: NCM will follow along with CSW for dc needs.   Expected Discharge Date:  06/04/17               Expected Discharge Plan:  Skilled Nursing Facility  In-House Referral:  NA, Clinical Social Work  Discharge planning Services  CM Consult  Post Acute Care Choice:    Choice offered to:     DME Arranged:    DME Agency:     HH Arranged:    [place] Agency:     Status of Service:  In process, will continue to follow  If discussed at Long Length of Stay Meetings, dates discussed:    Additional Comments:  Zenon Mayo, RN 06/15/2017, 3:37 PM

## 2017-06-15 NOTE — Progress Notes (Addendum)
Patient ID: Kelli Velasquez, female   DOB: 1941-07-07, 76 y.o.   MRN: [id number]      Advanced Heart Failure Rounding Note  Subjective:    Over 2 weeks prior to admission, she had increased dyspnea with exertion, she was short of breath at rest on the day of admission.  Her husband says she has been taking 20 mg of lasix daily and occasionally an extra 20 mg of lasix.   Presented to [facility] ED with respiratory distress. Family reported increased dyspnea over the last week requiring 2 liters oxygen at home. In ED she received solumedrol and duonebs. Placed on Bipap but continue decline requiring intubation. CXR concerning for interstitial edema and bilateral effusions.  Extubated 11/4. Noted to have swelling of upper airways on speech/swallow evaluation 11/6.  Developed respiratory distress 11/6 in the evening with hypoxia and hypercapnia and was re-intubated.    Extubated 06/07/17.   CXR 06/09/17 with LLL PNA.  She has completed abx for MSSA PNA.   UE [place] 06/14/17 with non-occlusive DVT in right axillary vein. Noted around PICC line in right basilic vein, and short segment of SVT noted in the forearm within the cephalic vein. LUE negative  BLE [place] 06/14/17 negative for DVTs.  She remains very hoarse but is able to answer questions with some help from daughter.  Less confused today, oriented to family and place.  No dyspnea.  Remains in NSR.  Remains NPO getting everything via the NGT.   Cardiac Studies: Echo 05/26/2017 EF 35-40%  Grade I DD RV normal Echo 2017 EF 45-50% with WMA Myoview 2017 No significant ischemia EF 48%.  [place] 2011- DES LAD and RCA.   Objective:   Weight Range: 233 lb 0.4 oz (105.7 kg) Body mass index is 40 kg/m.   Vital Signs:   Temp:  [96 F (35.6 C)-98.5 F (36.9 C)] 98.2 F (36.8 C) (11/20 0809) Pulse Rate:  [74-95] 95 (11/20 0809) Resp:  [22-34] 24 (11/20 0809) BP: (100-170)/(44-77) 141/50 (11/20 0809) SpO2:  [28 %-100 %] 89 % (11/20 0809) Weight:  [233 lb  0.4 oz (105.7 kg)] 233 lb 0.4 oz (105.7 kg) (11/20 0444) Last BM Date: 06/15/17  Weight change: Filed Weights   06/13/17 0402 06/14/17 0500 06/15/17 0444  Weight: 237 lb 10.5 oz (107.8 kg) 236 lb 12.4 oz (107.4 kg) 233 lb 0.4 oz (105.7 kg)   Intake/Output:   Intake/Output Summary (Last 24 hours) at 06/15/2017 0934 Last data filed at 06/15/2017 6767 Gross per 24 hour  Intake 2517.16 ml  Output 2250 ml  Net 267.16 ml    Physical Exam   General: Chronically ill and elderly appearing. NAD.  HEENT: Normal Neck: Thick. JVP difficult. No thyromegaly or nodule noted. Cor: PMI nonpalpable. RRR, No M/G/R noted Lungs: Diminished, On 3 L O2. Abdomen: Soft, non-tender, non-distended, no HSM. No bruits or masses. +BS  Extremities: No cyanosis, clubbing, or rash. R and LLE no edema.  Neuro: Alert & orientedx3, cranial nerves grossly intact. moves all 4 extremities w/o difficulty. Affect pleasant   Telemetry   NSR 70s (personally reviewed)  Labs    CBC Recent Labs    06/14/17 0500 06/15/17 0634  WBC 15.6* 15.1*  NEUTROABS 12.7* 11.6*  HGB 10.3* 10.8*  HCT 37.0 37.5  MCV 103.1* 101.6*  PLT 85* 209*   Basic Metabolic Panel Recent Labs    06/14/17 0500 06/15/17 0634  NA 147* 149*  K 3.5 3.6  CL 101 104  CO2 39* 37*  GLUCOSE 154* 123*  BUN 35* 32*  CREATININE 0.74 0.69  CALCIUM 8.8* 8.9  MG  --  2.4   Liver Function Tests Recent Labs    06/13/17 1520 06/15/17 0634  AST 25 25  ALT 33 40  ALKPHOS 58 59  BILITOT 0.9 0.8  PROT 5.5* 5.2*  ALBUMIN 2.8* 2.6*   No results for input(s): LIPASE, AMYLASE in the last 72 hours. Cardiac Enzymes Recent Labs    06/12/17 1702  TROPONINI 0.07*   BNP: BNP (last 3 results) Recent Labs    05/26/17 0610  BNP 760.9*   ProBNP (last 3 results) Recent Labs    01/11/17 1053  PROBNP 264.0*   D-Dimer No results for input(s): DDIMER in the last 72 hours. Hemoglobin A1C No results for input(s): HGBA1C in the last 72  hours. Fasting Lipid Panel No results for input(s): CHOL, HDL, LDLCALC, TRIG, CHOLHDL, LDLDIRECT in the last 72 hours. Thyroid Function Tests Recent Labs    06/15/17 0634  TSH 1.452    Other results:  Imaging   Dg Chest Port 1 View  Result Date: 06/15/2017 CLINICAL DATA:  Respiratory failure EXAM: PORTABLE CHEST 1 VIEW COMPARISON:  Yesterday FINDINGS: Right upper extremity PICC with tip at the SVC level. An orogastric tube reaches the stomach. Low volume chest with indistinct opacities at the bases. Possible cardiomegaly. No visible effusion or pneumothorax. IMPRESSION: Low volume chest with atelectasis or pneumonia at the bases. No change from yesterday. Electronically Signed   By: Monte Fantasia M.D.   On: 06/15/2017 09:10    Medications:     Scheduled Medications: . aspirin  81 mg Per Tube Daily  . bisoprolol  5 mg Per Tube Daily  . chlorhexidine gluconate (MEDLINE KIT)  15 mL Mouth Rinse BID  . Chlorhexidine Gluconate Cloth  6 each Topical Q0600  . cyanocobalamin  1,000 mcg Subcutaneous Daily  . ezetimibe  10 mg Per Tube QHS  . feeding supplement (PRO-STAT SUGAR FREE 64)  30 mL Per Tube TID  . folic acid  1 mg Per Tube Daily  . free water  300 mL Per Tube Q8H  . furosemide  40 mg Oral Daily  . hydrALAZINE  50 mg Per Tube Q8H  . insulin aspart  0-20 Units Subcutaneous Q4H  . insulin glargine  26 Units Subcutaneous Daily  . ipratropium-albuterol  3 mL Nebulization Q6H  . isosorbide dinitrate  20 mg Per Tube TID  . mouth rinse  15 mL Mouth Rinse QID  . montelukast  10 mg Per Tube QHS  . pantoprazole sodium  40 mg Per Tube Daily  . predniSONE  15 mg Per Tube Q breakfast  . sodium chloride flush  10-40 mL Intracatheter Q12H  . spironolactone  25 mg Per Tube Daily    Infusions: . sodium chloride 250 mL (06/11/17 1400)  . bivalirudin (ANGIOMAX) infusion 0.5 mg/mL (Non-ACS indications) 0.1 mg/kg/hr (06/14/17 2050)  . 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  Admitted with acute/chronic respiratory failure  Assessment/Plan   1. Acute/chronic systolic CHF: Probably ischemic cardiomyopathy given prior history.  Echo this admission with EF down to 35-40% range, around 50% in past.  She was admitted  with worsening dyspnea, elevated BNP, CXR with pulmonary edema.  She has been diuresed while here, now on po Lasix. Volume status seems ok at this point, weight trending down.  - Continue lasix 40 mg daily.  - Plan for [facility] tomorrow => assess filling pressures, see if progressive CAD has caused fall in EF/worsening HF.  Discussed risks/benefits with patient and her family today, they agree to proceed with cath tomorrow.   - Continue hydral 50 mg TID - Continue isordil 20 mg TID.  - Can increase spironolactone to 25 mg daily.  - Can increase bisoprolol to 5 mg daily (no wheezing so far, more beta-1 specific beta blocker).  - Eventually can transition hydralazine/isordil over to [facility] with stabilized creatinine but would wait until after cath.  2. Acute hypoxemic respiratory failure: Initially thought multifactorial due to pulmonary edema, effusions, BOOP and OHS.  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  She was diuresed.  At baseline, she is on 2L home oxygen.  Extubated 11/4 but developed respiratory distress 11/6 and re-intubated.  She had had swelling of the upper airways noted by speech/swallow exam.  Found to have MSSA PNA.  She has now completed antibiotic course.  Much improved, now back down to 3L oxygen by nasal cannula. 3. CAD: h/o DES to LAD and RCA in 2011.  No chest pain but admitted with severe dyspnea/volume overload and fall in EF on echo.  Troponin  mildly elevated but no trend. Suspect most likely demand ischemia from volume overload.  - Given fall in EF and now stable pulmonary status, will plan [facility] for tomorrow.  4.  AKI: Resolved.  5. HTN:  Systolic pressure between 120-140. Slightly elevated this am, but for some reason getting IV hydralazine 69m occasionally instead of po every time.  Should stick with po now that she is tolerating.  6. Code status: DNR/DNI at this time.  7. NSVT: No recurrence.  8. DVT: RUE, triggered by PICC. Started on bivalirudin 06/14/17 on hematology recommendation.   - Remove PICC.  - Given triggered RUE DVT, would favor 3 months anticoagulation with Xarelto or Eliquis.   Length of Stay: 2[place]11/20/2018 9:56 AM  Advanced Heart Failure Team Pager [phone](M-F; 7a - 4p)  Please contact [facility] for night-coverage after hours (4p -7a ) and weekends on [email or web address]

## 2017-06-15 NOTE — Progress Notes (Signed)
Physical Therapy Treatment Patient Details Name: Kelli Velasquez MRN: [id number] DOB: 1940/10/07 Today's Date: 06/15/2017    History of Present Illness 76 yo female admitted for progressive respiratory distress.  She then developed altered mental status.  Pt has a past medical history including Allergy, Asthma, Chronic bronchitis, CAD, Dyspnea, GERD, Hyperlipidemia, Hypertension, Myocardial infarction, Myopia, Neoplasm of skin, Nocturnal oxygen desaturation, Obesity, Osteoarthritis, Oxygen deficiency,  Urinary incontinence, and Vertigo. Required intubation 10/31-11/4 and  11/6-11/14, now off Bipap.    PT Comments    Pt admitted with above diagnosis. Pt currently with functional limitations due to balance and endurance deficits. Pt was able to stand x 2 for up to 2 min each time to be cleaned.  Too tired to ambulate after standing to be cleaned.  Therefore got to chair. Pt was on 6LO2 with activity with sats 82-85% during activity.  After activity, pt on 2LO2 with sats 88-92%.  Other VSS.  Will follow acutely and progress as able.   Pt will benefit from skilled PT to increase their independence and safety with mobility to allow discharge to the venue listed below.     Follow Up Recommendations  SNF;Supervision/Assistance - 24 hour     Equipment Recommendations  None recommended by PT;Other (comment)(defer to next venue)    Recommendations for Other Services       Precautions / Restrictions Precautions Precautions: Fall Precaution Comments: watch O2 saturations and HR Restrictions Weight Bearing Restrictions: No    Mobility  Bed Mobility Overal bed mobility: Needs Assistance Bed Mobility: Supine to Sit;Sit to Supine Rolling: Supervision   Supine to sit: Mod assist;HOB elevated;Max assist;+2 for physical assistance Sit to supine: Mod assist;+2 for physical assistance   General bed mobility comments: cueing for sequencing and to maintain attention to task; assist to elevate trunk and  position hips at EOB  Transfers Overall transfer level: Needs assistance Equipment used: Rolling walker (2 wheeled) Transfers: Sit to/from Stand;Lateral/Scoot Transfers Sit to Stand: From elevated surface;Mod assist;+2 physical assistance Stand pivot transfers: +2 physical assistance;Min assist       General transfer comment: Upon standing, pt stated she needed to use the bathroom.  Obtained bedpan (3N1 in room too small for pt) and placed it under pt on bed.  Pt sat down and had BM in bedpan and then stood again to be cleaned.  Then pt pivoted to chair. Pt needed assist to power up but once up took pivotal steps with RW with guidance by PT moving RW.   Ambulation/Gait                 Stairs            Wheelchair Mobility    Modified Rankin (Stroke Patients Only)       Balance Overall balance assessment: Needs assistance Sitting-balance support: Bilateral upper extremity supported;Feet supported Sitting balance-Leahy Scale: Fair Sitting balance - Comments: No problems sitting EOB today.    Standing balance support: During functional activity;Bilateral upper extremity supported Standing balance-Leahy Scale: Poor Standing balance comment: relies on UE support and external support.                             Cognition Arousal/Alertness: Awake/alert Behavior During Therapy: WFL for tasks assessed/performed Overall Cognitive Status: Difficult to assess  Exercises General Exercises - Lower Extremity Long Arc Quad: AROM;Both;10 reps;Seated Hip Flexion/Marching: AROM;Both;10 reps;Seated    General Comments        Pertinent Vitals/Pain Pain Assessment: Faces Faces Pain Scale: Hurts little more Pain Location: left UE to touch Pain Descriptors / Indicators: Grimacing;Guarding;Sore Pain Intervention(s): Limited activity within patient's tolerance;Monitored during session;Premedicated before  session;Repositioned    Home Living                      Prior Function            PT Goals (current goals can now be found in the care plan section) Acute Rehab PT Goals Patient Stated Goal: go home Progress towards PT goals: Progressing toward goals    Frequency    Min 3X/week      PT Plan Current plan remains appropriate    Co-evaluation              AM-PAC PT "6 Clicks" Daily Activity  Outcome Measure  Difficulty turning over in bed (including adjusting bedclothes, sheets and blankets)?: Unable Difficulty moving from lying on back to sitting on the side of the bed? : Unable Difficulty sitting down on and standing up from a chair with arms (e.g., wheelchair, bedside commode, etc,.)?: Unable Help needed moving to and from a bed to chair (including a wheelchair)?: A Lot Help needed walking in hospital room?: Total Help needed climbing 3-5 steps with a railing? : Total 6 Click Score: 7    End of Session Equipment Utilized During Treatment: Oxygen;Gait belt(3L of O2 via [place]) Activity Tolerance: Patient limited by fatigue Patient left: with call bell/phone within reach;with family/visitor present;in chair Nurse Communication: Mobility status;Need for lift equipment PT Visit Diagnosis: Unsteadiness on feet (R26.81);Difficulty in walking, not elsewhere classified (R26.2)     Time: 1030-1102 PT Time Calculation (min) (ACUTE ONLY): 32 min  Charges:  $Therapeutic Exercise: 8-22 mins $Therapeutic Activity: 8-22 mins                    G Codes:       Vivienne Sangiovanni,PT Acute Rehabilitation [phone] (pager)    Denice Paradise 06/15/2017, 11:48 AM

## 2017-06-15 NOTE — Progress Notes (Signed)
[facility] Progress Note  Patient: Kelli Velasquez YNW:[id number]   PCP: Abner Greenspan, MD DOB: 1941-04-07   DOA: 05/26/2017   DOS: 06/15/2017   Date of Service: the patient was seen and examined on 06/15/2017  Subjective: Agitation better.  No nausea no vomiting.  No other complaints reported.  Brief hospital course: Pt. with PMH of CHF, A. fib, CAD, HTN, BOOP; admitted on 05/26/2017, presented with complaint of shortness of breath, was found to have MSSA pneumonia with acute respiratory failure.  Patient was admitted in the ICU with intubation and extubated on 05/30/2017.  Followed by patient had another episode of respiratory distress requiring repeat intubation on 06/02/2017.  Currently extubated on 06/07/2017 and transferred to stepdown unit. After repeat extubation patient remains aphonic. Bronchoscopy performed on 06/02/2017, cultures so far are negative.  Sputum culture is growing MSSA patient completed antibiotic course on 06/11/2017. Also found to have acute systolic CHF, cardiology consulted and currently on IV Lasix recommend inpatient cardiac cath. Currently further plan is continue monitoring for improvement in voice quality as well as further cardiac workup.  Assessment and Plan: 1.  Acute hypoxic respiratory failure. Multifactorial. Currently on nasal cannula.  Continue BiPAP nightly and as needed. Tolerating it better. Continue respiratory care. CCM following. Oxygenation getting better with removal of the fluid with diuresis.  Continue current plan.  2.  Presumed sleep disordered breathing. BiPAP nightly for now will probably continue on discharge. Follow-up on pulmonary recommendation for the same.  3.  MSSA pneumonia. Antibiotic course completed. Monitor.  4.  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 Dose changed to 15 mg daily.   Managed by CCM.  5.  Acute on chronic combined CHF. A. fib with RVR. CAD and HLD. Cardiology consulted. Continued on Lasix.   Changed to p.o. Cardiology considering right and left heart cath tomorrow. Echocardiogram shows decreased EF of 35%. Continue to monitor on the telemetry.  6.  Dysphagia. Aphonia. Speech therapy following. Likely injury from intubation. Monitor. May require a PEG tube if not progressing well.  Monitor for now.  7.  Type 2 diabetes mellitus. Continue sliding scale insulin.  8.  Protein calorie malnutrition in the acute setting due to poor p.o. intake. Continue tube feeding for now.  9.  Acute encephalopathy. Likely multifactorial with deconditioning as well as recurrent intubation causing delirium along with hypercarbia. CT scan MRI brain negative for any acute intracranial abnormality. Minimize narcotics minimize sedating medication.  Monitor. Patient is hard of hearing, need to verify every time if there is a change in mental status whether that is because of patient's poor hearing.  10.  Acute axillary DVT as well as SVT on the right associated with PICC line. PICC line has been removed. Patient started on IV bivalirudin based on discussion with hematology.  has developed thrombocytopenia while was on heparin during the initial stay in the hospital.  HRT was mildly elevated SRA was negative.  11.  Thrombocytopenia. Unclear etiology but more likely bone marrow suppression. Workup related to hemolysis is negative.  Morphology on smear unremarkable.  Did receive heparin which made the count low but SRA is negative. Discussed with hematology Dr. Julien Nordmann on call recommend no further workup right now.  12.  Foley catheter. Present since admission, at the request of the nursing will continue it for today and removed after cardiac catheterization tomorrow.  Diet: On tube feeding via cor trac DVT Prophylaxis: subcutaneous Heparin  Advance goals of care discussion: DNR DNI  Family Communication:  no family was present at bedside, at the time of interview.  Discussed with patient's  husband on 06/12/2017  Disposition:  Discharge to be determined.   Consultants: CCM, cardiology Procedures: Echocardiogram, intubation , bronchoscopy  Antibiotics: Anti-infectives (From admission, onward)   Start     Dose/Rate Route Frequency Ordered Stop   06/05/17 2200  cefTRIAXone (ROCEPHIN) 1 g in dextrose 5 % 50 mL IVPB     1 g 100 mL/hr over 30 Minutes Intravenous Every 24 hours 06/05/17 1442 06/11/17 2359   06/03/17 0400  vancomycin (VANCOCIN) 1,250 mg in sodium chloride 0.9 % 250 mL IVPB  Status:  Discontinued     1,250 mg 166.7 mL/hr over 90 Minutes Intravenous Every 24 hours 06/02/17 0343 06/03/17 0919   06/02/17 0345  cefTAZidime (FORTAZ) 2 g in dextrose 5 % 50 mL IVPB  Status:  Discontinued     2 g 100 mL/hr over 30 Minutes Intravenous Every 12 hours 06/02/17 0343 06/05/17 1442   06/02/17 0345  vancomycin (VANCOCIN) 2,000 mg in sodium chloride 0.9 % 500 mL IVPB     2,000 mg 250 mL/hr over 120 Minutes Intravenous  Once 06/02/17 0343 06/02/17 0752   05/28/17 2000  fluconazole (DIFLUCAN) IVPB 100 mg     100 mg 50 mL/hr over 60 Minutes Intravenous  Once 05/28/17 1933 05/28/17 2119       Objective: Physical Exam: Vitals:   06/15/17 1118 06/15/17 1309 06/15/17 1427 06/15/17 1553  BP: (!) 131/100 112/69  (!) 111/96  Pulse: 90   81  Resp: (!) 31   (!) 33  Temp: 98.1 F (36.7 C)   97.9 F (36.6 C)  TempSrc: Oral   Oral  SpO2: (!) 86%  100% 100%  Weight:      Height:        Intake/Output Summary (Last 24 hours) at 06/15/2017 1753 Last data filed at 06/15/2017 1700 Gross per 24 hour  Intake 3112.16 ml  Output 1225 ml  Net 1887.16 ml   Filed Weights   06/13/17 0402 06/14/17 0500 06/15/17 0444  Weight: 107.8 kg (237 lb 10.5 oz) 107.4 kg (236 lb 12.4 oz) 105.7 kg (233 lb 0.4 oz)   General: Alert, Awake and Oriented to Person. Appear in moderate distress, affect appropriate Eyes: PERRL, Conjunctiva normal ENT: Oral Mucosa clear moist. Neck: no JVD, no Abnormal  Mass Or lumps Cardiovascular: S1 and S2 Present, no Murmur, Peripheral Pulses Present Respiratory: increased respiratory effort, Bilateral Air entry equal and Decreased, no use of accessory muscle, bilateral Crackles, Occasional wheezes Abdomen: Bowel Sound present, Soft and no tenderness, no hernia Skin: no redness, no Rash, no induration Extremities: no Pedal edema, no calf tenderness Neurologic: Grossly no focal neuro deficit. Bilaterally Equal motor strength  Data Reviewed: CBC: Recent Labs  Lab 06/11/17 2304 06/12/17 0327 06/13/17 1520 06/14/17 0500 06/15/17 0634  WBC 17.5* 18.5* 16.7* 15.6* 15.1*  NEUTROABS 15.8* 16.2* 15.5* 12.7* 11.6*  HGB 11.6* 11.2* 11.2* 10.3* 10.8*  HCT 40.9 39.2 39.1 37.0 37.5  MCV 103.3* 102.6* 104.3* 103.1* 101.6*  PLT 90* 78* 83* 85* 110*   Basic Metabolic Panel: Recent Labs  Lab 06/12/17 0327 06/13/17 0600 06/13/17 1520 06/14/17 0500 06/15/17 0634  NA 153* 154* 151* 147* 149*  K 3.6 3.7 4.1 3.5 3.6  CL 108 109 104 101 104  CO2 38* 39* 39* 39* 37*  GLUCOSE 85 200* 203* 154* 123*  BUN 43* 40* 37* 35* 32*  CREATININE 0.79 0.81 0.86 0.74 0.69  CALCIUM 8.9 9.2 9.2 8.8* 8.9  MG 2.5*  --   --   --  2.4    Liver Function Tests: Recent Labs  Lab 06/13/17 1520 06/15/17 0634  AST 25 25  ALT 33 40  ALKPHOS 58 59  BILITOT 0.9 0.8  PROT 5.5* 5.2*  ALBUMIN 2.8* 2.6*   No results for input(s): LIPASE, AMYLASE in the last 168 hours. No results for input(s): AMMONIA in the last 168 hours. Coagulation Profile: No results for input(s): INR, PROTIME in the last 168 hours. Cardiac Enzymes: Recent Labs  Lab 06/12/17 0327 06/12/17 0748 06/12/17 1702  TROPONINI 0.07* 0.07* 0.07*   BNP (last 3 results) Recent Labs    01/11/17 1053  PROBNP 264.0*   CBG: Recent Labs  Lab 06/14/17 2351 06/15/17 0356 06/15/17 0838 06/15/17 1153 06/15/17 1712  GLUCAP 125* 107* 124* 142* 187*   Studies: Dg Chest Port 1 View  Result Date:  06/15/2017 CLINICAL DATA:  Respiratory failure EXAM: PORTABLE CHEST 1 VIEW COMPARISON:  Yesterday FINDINGS: Right upper extremity PICC with tip at the SVC level. An orogastric tube reaches the stomach. Low volume chest with indistinct opacities at the bases. Possible cardiomegaly. No visible effusion or pneumothorax. IMPRESSION: Low volume chest with atelectasis or pneumonia at the bases. No change from yesterday. Electronically Signed   By: Monte Fantasia M.D.   On: 06/15/2017 09:10    Scheduled Meds: . aspirin  81 mg Per Tube Daily  . bisoprolol  5 mg Per Tube Daily  . chlorhexidine gluconate (MEDLINE KIT)  15 mL Mouth Rinse BID  . Chlorhexidine Gluconate Cloth  6 each Topical Q0600  . cyanocobalamin  1,000 mcg Subcutaneous Daily  . ezetimibe  10 mg Per Tube QHS  . feeding supplement (PRO-STAT SUGAR FREE 64)  30 mL Per Tube TID  . folic acid  1 mg Per Tube Daily  . free water  300 mL Per Tube Q8H  . furosemide  40 mg Oral Daily  . hydrALAZINE  50 mg Per Tube Q8H  . insulin aspart  0-20 Units Subcutaneous Q4H  . insulin glargine  26 Units Subcutaneous Daily  . ipratropium-albuterol  3 mL Nebulization Q6H  . isosorbide dinitrate  20 mg Per Tube TID  . mouth rinse  15 mL Mouth Rinse QID  . montelukast  10 mg Per Tube QHS  . pantoprazole sodium  40 mg Per Tube Daily  . predniSONE  15 mg Per Tube Q breakfast  . sodium chloride flush  10-40 mL Intracatheter Q12H  . spironolactone  25 mg Per Tube Daily   Continuous Infusions: . sodium chloride 250 mL (06/11/17 1400)  . bivalirudin (ANGIOMAX) infusion 0.5 mg/mL (Non-ACS indications) 0.1 mg/kg/hr (06/14/17 2050)  . feeding supplement (JEVITY 1.2 CAL) 1,000 mL (06/14/17 1712)   PRN Meds: sodium chloride, albuterol, haloperidol lactate, hydrALAZINE, metoprolol tartrate  Time spent: 35 minutes  Author: Berle Mull, MD [facility] Pager: [phone] 06/15/2017 5:53 PM  If 7PM-7AM, please contact night-coverage at [email or web address],  password [facility]

## 2017-06-15 NOTE — Progress Notes (Signed)
Nutrition Follow-up  DOCUMENTATION CODES:   Morbid obesity  INTERVENTION:    Continue Jevity 1.2 at 55 ml/h (1320 ml per day) with 30 ml Pro-stat TID  Pt receiving 300 mL free water every 8 hrs  Total regimen providing: 1884 kcal, 118 gm protein, 1969 ml free water daily  Monitor for diet order changes  NUTRITION DIAGNOSIS:   Inadequate oral intake related to inability to eat as evidenced by NPO status.  Ongoing   GOAL:   Patient will meet greater than or equal to 90% of their needs  Met via TF and Pro-Stat regimen  MONITOR:   TF tolerance, Labs, I & O's  REASON FOR ASSESSMENT:   Consult Enteral/tube feeding initiation and management  ASSESSMENT:   76 yo female with PMH of HLD, HTN, BOOP, HF, obesity, vertigo, asthma, CAD, GERD, fish and shellfish allergy (per husband she has anaphylaxis) who was admitted on 10/31 with respiratory failure related to CHF.  Discussed pt with RN. RN reports pt continues to tolerate tube feeding. Pt for FEES this afternoon with SLP, RD to monitor for changes to nutritional status. Pt receiving 300 mL free water every 8 hrs, 900 mL daily.  Labs reviewed; CBG 107-179, Na 149, BUN 32,  Medications reviewed; vitamin K-10, folic acid, sliding scale insulin, Lantus, Protonix, Prednisone  Diet Order:  Diet NPO time specified  EDUCATION NEEDS:   No education needs have been identified at this time  Skin:  Skin Assessment: Reviewed RN Assessment  Last BM:  06/15/17    Height:   Ht Readings from Last 1 Encounters:  05/31/17 5' 4"  (1.626 m)    Weight:   Wt Readings from Last 1 Encounters:  06/15/17 233 lb 0.4 oz (105.7 kg)    Ideal Body Weight:  54.5 kg  BMI:  Body mass index is 40 kg/m.  Estimated Nutritional Needs:   Kcal:  1700-1900  Protein:  110-130 gm  Fluid:  1.7-1.9 L  Parks Ranger, MS, RDN, LDN 06/15/2017 12:31 PM

## 2017-06-15 NOTE — Progress Notes (Signed)
ANTICOAGULATION CONSULT NOTE - Follow Up Consult  Pharmacy Consult for Bivairudin Indication: DVT  Allergies  Allergen Reactions  . Fish Allergy Anaphylaxis  . Shellfish Allergy Anaphylaxis  . Aspirin     REACTION: nausea and vomiting High doses  . Atorvastatin     REACTION: leg pain  . Crestor [Rosuvastatin Calcium] Other (See Comments)    Muscle pain - allergy/intolerance  . Penicillins     Due to mold allergy per pt  . Simvastatin     REACTION: muscle pain  . Trandolapril     REACTION: leg pain   Patient Measurements: Height: 5\' 4"  (162.6 cm) Weight: 236 lb 12.4 oz (107.4 kg) IBW/kg (Calculated) : 54.7  Vital Signs: Temp: 96 F (35.6 C) (11/19 2348) Temp Source: Axillary (11/19 2348) BP: 134/77 (11/19 2348) Pulse Rate: 90 (11/19 2348)  Labs: Recent Labs    06/12/17 0327 06/12/17 0748 06/12/17 1702 06/13/17 0600 06/13/17 1520 06/14/17 0500 06/14/17 2200  HGB 11.2*  --   --   --  11.2* 10.3*  --   HCT 39.2  --   --   --  39.1 37.0  --   PLT 78*  --   --   --  83* 85*  --   APTT  --   --   --   --   --   --  50*  CREATININE 0.79  --   --  0.81 0.86 0.74  --   TROPONINI 0.07* 0.07* 0.07*  --   --   --   --    Estimated Creatinine Clearance: 70.5 mL/min (by C-G formula based on SCr of 0.74 mg/dL).  Medical History: Past Medical History:  Diagnosis Date  . Allergy    allergic rhinitis  . Asthma   . Chronic bronchitis ([place])   . Coronary artery disease    cath January 2011 with DEs LAD and RCA  . Dyspnea   . Full dentures   . Gallstones   . GERD (gastroesophageal reflux disease)   . History of echocardiogram    Echo 5/17: mod LVH, EF 50-55%, ant-septal HK, Gr 1 DD, mod LAE //  b.  Echo 7/17: mild concentric LVH, EF 45-50%, inf-lat, inf, inf-septal HK, mild LAE  . History of nuclear stress test    Myoview 7/17: EF 48%, small mild apical defect, no ischemia, low risk  . Hyperlipidemia   . Hypertension   . 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 Myopia   . 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 Nocturnal oxygen desaturation    o2 at night  . Obesity   . Osteoarthritis    knees, fingers, shoulders  . Overactive bladder   . Oxygen deficiency    uses oxygen all day  . Rash    and other non specific skin eruptions  . Retaining fluid    in ankles and feet  . Urinary incontinence   . Vertigo   . Wears glasses     Assessment: 76yof admitted with respiratory distress, s/p VDRF now improved.  She had drop in PLTC while on heparin sq for VTE px and have remained low despite off heparin.  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  New VTE at PICC line tip  - now needs anticoagulation - recommended by Hematology to use a DTI over heaprin.    APTT is therapeutic at 50s. Bival charted as started  at 2050 PM and lab drawn at 2200.   Goal of Therapy:  aPTT 50-85 sec seconds Monitor platelets by anticoagulation protocol: Yes   Plan:  Continue bivalirudin @ 0.1mg /kg/hr Check aPTT with AM labs to confirm therapeutic  Daily aPTT and CBC Monitor for s/s bleeding   Lavonda Jumbo, PharmD Clinical Pharmacist 06/15/17 1:16 AM

## 2017-06-15 NOTE — Procedures (Signed)
Objective Swallowing Evaluation: Type of Study: FEES-Fiberoptic Endoscopic Evaluation of Swallow   Patient Details  Name: Kelli Velasquez MRN: [id number] Date of Birth: Feb 24, 1941  Today's Date: 06/15/2017 Time: SLP Start Time (ACUTE ONLY): 1358 -SLP Stop Time (ACUTE ONLY): 1422  SLP Time Calculation (min) (ACUTE ONLY): 24 min   Past Medical History:  Past Medical History:  Diagnosis Date  . Allergy    allergic rhinitis  . Asthma   . Chronic bronchitis ([place])   . Coronary artery disease    cath January 2011 with DEs LAD and RCA  . Dyspnea   . Full dentures   . Gallstones   . GERD (gastroesophageal reflux disease)   . History of echocardiogram    Echo 5/17: mod LVH, EF 50-55%, ant-septal HK, Gr 1 DD, mod LAE //  b.  Echo 7/17: mild concentric LVH, EF 45-50%, inf-lat, inf, inf-septal HK, mild LAE  . History of nuclear stress test    Myoview 7/17: EF 48%, small mild apical defect, no ischemia, low risk  . Hyperlipidemia   . Hypertension   . Myocardial infarction ([place])    subendocardial, initial episode, 2010 two stents placed  . Myopia   . 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 Nocturnal oxygen desaturation    o2 at night  . Obesity   . Osteoarthritis    knees, fingers, shoulders  . Overactive bladder   . Oxygen deficiency    uses oxygen all day  . Rash    and other non specific skin eruptions  . Retaining fluid    in ankles and feet  . Urinary incontinence   . Vertigo   . Wears glasses    Past Surgical History:  Past Surgical History:  Procedure Laterality Date  . CATARACT EXTRACTION W/PHACO Right 12/16/2016   Procedure: CATARACT EXTRACTION PHACO AND INTRAOCULAR LENS PLACEMENT ([place])  right;  Surgeon: Leandrew Koyanagi, MD;  Location: [place];  Service: Ophthalmology;  Laterality: Right;  . CATARACT EXTRACTION W/PHACO Left 02/03/2017   Procedure: CATARACT EXTRACTION PHACO AND INTRAOCULAR LENS PLACEMENT ([place])  Left  Complicated;   Surgeon: Leandrew Koyanagi, MD;  Location: [place];  Service: Ophthalmology;  Laterality: Left;  Malyugin Uses oxygen   . CHOLECYSTECTOMY  92  . COLONOSCOPY    . CORONARY ANGIOPLASTY WITH STENT PLACEMENT  07/2009   stent  . DILATION AND CURETTAGE OF UTERUS  1984  . INCISIONAL HERNIA REPAIR N/A 05/16/2013   Procedure: HERNIA REPAIR INFRAUMILICAL INCISIONAL;  Surgeon: Joyice Faster. Cornett, MD;  Location: [facility];  Service: General;  Laterality: N/A;  umbilical  . INSERTION OF MESH N/A 05/16/2013   Procedure: INSERTION OF MESH;  Surgeon: Joyice Faster. Cornett, MD;  Location: [facility];  Service: General;  Laterality: N/A;  umbilical  . JOINT REPLACEMENT  2007   right total knee replacement  . TOTAL KNEE ARTHROPLASTY  2010   left , then vocal cord infection post op  . VIDEO BRONCHOSCOPY Bilateral 07/05/2013   Procedure: VIDEO BRONCHOSCOPY WITH FLUORO;  Surgeon: Tanda Rockers, MD;  Location: [facility];  Service: Cardiopulmonary;  Laterality: Bilateral;  . vocal cord polypectomy     HPI: 76yo female former smoker (quit 1984) with hx CAD, HTN, CHF, BOOP followed by Dr. Melvyn Novas on chronic prednisone presented 10/31 with progressive SOB, cough, wheezing, BLE edema. She had significant respiratory distress in ER and failed trial bipap requiring intubation 10/31-11/4. FEES 11/6 showed  severe edema and weakness, standing secretions, and diffuse residue that could not be cleared. Recommendations at that time were to remain NPO except for a few ice chips given after oral care. Pt was reintubated 11/7-11/12 and stayed on BiPAP post-extubation. SLP was consulted to reassess swallowing.   Subjective: pt alert, aphonic    Assessment / Plan / Recommendation  CHL IP CLINICAL IMPRESSIONS 06/15/2017  Clinical Impression Pt has a moderate oropharyngeal dysphagia that is exacerbated by cognitive status, although overall it seems improved from initial FEES completed  11/6. She does not seem to be as edematous, as I can better visualize her vocal folds, which do not appear to have complete closure. Orally she has delayed transit, some premature loss into the pharynx, and piecemeal swallowing. Sometimes, particularly with purees, pt also has oral residue that she starts to transfer into her pharynx, but she does not initiate a second swallow to clear her pharynx. SLP provided Max cues for pt to complete a volitional swallow, but pt never swallowed to command. Occasionally, she would say that she "already did." Pt has mild pharyngeal weakness as well as incompelte airway closure that allows for penetration into the airway with all consistencies except for purees. She appeared to silently aspirate at least x1 with nectar thick liquids. When pt did cough to command she was able to clear her airway well, but she only did this about 50% of the time or less even when given Max cues. Pt's mentation does not allow for safe PO intake. Recommend allowing a few pieces of ice given throughout the day by RN in order to keep her oropharynx safe and increase use of her hyolaryngeal musculature. SLP will trial additional boluses in therapy - if pt could cough or swallow more consistently to command, she may be ready for a PO diet. Will follow along to progress.  SLP Visit Diagnosis Dysphagia, oropharyngeal phase (R13.12)  Attention and concentration deficit following --  Frontal lobe and executive function deficit following --  Impact on safety and function Moderate aspiration risk;Severe aspiration risk      CHL IP TREATMENT RECOMMENDATION 06/15/2017  Treatment Recommendations Therapy as outlined in treatment plan below     Prognosis 06/15/2017  Prognosis for Safe Diet Advancement Good  Barriers to Reach Goals Cognitive deficits  Barriers/Prognosis Comment --    CHL IP DIET RECOMMENDATION 06/15/2017  SLP Diet Recommendations NPO  Liquid Administration via --  Medication  Administration Via alternative means  Compensations --  Postural Changes --      CHL IP OTHER RECOMMENDATIONS 06/15/2017  Recommended Consults --  Oral Care Recommendations Oral care QID  Other Recommendations Have oral suction available      CHL IP FOLLOW UP RECOMMENDATIONS 06/15/2017  Follow up Recommendations Skilled Nursing facility      [facility] IP FREQUENCY AND DURATION 06/15/2017  Speech Therapy Frequency (ACUTE ONLY) min 2x/week  Treatment Duration 2 weeks           CHL IP ORAL PHASE 06/15/2017  Oral Phase Impaired  Oral - Pudding Teaspoon --  Oral - Pudding Cup --  Oral - Honey Teaspoon Delayed oral transit;Premature spillage;Piecemeal swallowing  Oral - Honey Cup --  Oral - Nectar Teaspoon Delayed oral transit;Premature spillage;Piecemeal swallowing  Oral - Nectar Cup --  Oral - Nectar Straw --  Oral - Thin Teaspoon Delayed oral transit;Premature spillage;Piecemeal swallowing;Other (Comment)  Oral - Thin Cup --  Oral - Thin Straw --  Oral - Puree Delayed oral transit;Premature spillage;Piecemeal  swallowing  Oral - Mech Soft --  Oral - Regular --  Oral - Multi-Consistency --  Oral - Pill --  Oral Phase - Comment --    CHL IP PHARYNGEAL PHASE 06/15/2017  Pharyngeal Phase Impaired  Pharyngeal- Pudding Teaspoon --  Pharyngeal --  Pharyngeal- Pudding Cup --  Pharyngeal --  Pharyngeal- Honey Teaspoon Delayed swallow initiation-pyriform sinuses;Reduced anterior laryngeal mobility;Reduced laryngeal elevation;Reduced airway/laryngeal closure;Penetration/Aspiration during swallow;Pharyngeal residue - pyriform  Pharyngeal Material enters airway, remains ABOVE vocal cords and not ejected out  Pharyngeal- Honey Cup --  Pharyngeal --  Pharyngeal- Nectar Teaspoon Delayed swallow initiation-pyriform sinuses;Reduced anterior laryngeal mobility;Reduced laryngeal elevation;Reduced airway/laryngeal closure;Pharyngeal residue - pyriform;Penetration/Aspiration during swallow   Pharyngeal Material enters airway, passes BELOW cords without attempt by patient to eject out (silent aspiration)  Pharyngeal- Nectar Cup --  Pharyngeal --  Pharyngeal- Nectar Straw --  Pharyngeal --  Pharyngeal- Thin Teaspoon Delayed swallow initiation-pyriform sinuses;Reduced anterior laryngeal mobility;Reduced laryngeal elevation;Reduced airway/laryngeal closure;Penetration/Aspiration during swallow;Pharyngeal residue - pyriform;Other (Comment)  Pharyngeal Material enters airway, remains ABOVE vocal cords and not ejected out  Pharyngeal- Thin Cup --  Pharyngeal --  Pharyngeal- Thin Straw --  Pharyngeal --  Pharyngeal- Puree Delayed swallow initiation-pyriform sinuses;Reduced anterior laryngeal mobility;Reduced laryngeal elevation;Reduced airway/laryngeal closure;Pharyngeal residue - pyriform  Pharyngeal --  Pharyngeal- Mechanical Soft --  Pharyngeal --  Pharyngeal- Regular --  Pharyngeal --  Pharyngeal- Multi-consistency --  Pharyngeal --  Pharyngeal- Pill --  Pharyngeal --  Pharyngeal Comment --     CHL IP CERVICAL ESOPHAGEAL PHASE 06/15/2017  Cervical Esophageal Phase WFL  Pudding Teaspoon --  Pudding Cup --  Honey Teaspoon --  Honey Cup --  Nectar Teaspoon --  Nectar Cup --  Nectar Straw --  Thin Teaspoon --  Thin Cup --  Thin Straw --  Puree --  Mechanical Soft --  Regular --  Multi-consistency --  Pill --  Cervical Esophageal Comment --    No flowsheet data found.  [name] 06/15/2017, 3:07 PM   [name], M.A. CCC-SLP [phone]

## 2017-06-15 NOTE — Progress Notes (Signed)
[facility] Progress Note  Patient: Kelli Velasquez EBR:[id number]   PCP: Abner Greenspan, MD DOB: June 25, 1941   DOA: 05/26/2017   DOS: 06/14/2017   Date of Service: the patient was seen and examined on 06/14/2017  Subjective: More awake, no acute events.  Breathing is better.  Brief hospital course: Pt. with PMH of CHF, A. fib, CAD, HTN, BOOP; admitted on 05/26/2017, presented with complaint of shortness of breath, was found to have MSSA pneumonia with acute respiratory failure.  Patient was admitted in the ICU with intubation and extubated on 05/30/2017.  Followed by patient had another episode of respiratory distress requiring repeat intubation on 06/02/2017.  Currently extubated on 06/07/2017 and transferred to stepdown unit. After repeat extubation patient remains aphonic. Bronchoscopy performed on 06/02/2017, cultures so far are negative.  Sputum culture is growing MSSA patient completed antibiotic course on 06/11/2017. Also found to have acute systolic CHF, cardiology consulted and currently on IV Lasix recommend inpatient cardiac cath. Currently further plan is continue monitoring for improvement in voice quality as well as further cardiac workup.  Assessment and Plan: 1.  Acute hypoxic respiratory failure. Multifactorial. Currently on nasal cannula.  Continue BiPAP nightly and as needed. Tolerating it better. Continue respiratory care. CCM following. Oxygenation getting better with removal of the fluid with diuresis.  Continue current plan.  2.  Presumed sleep disordered breathing. BiPAP nightly for now will probably continue on discharge. Follow-up on pulmonary recommendation for the same.  3.  MSSA pneumonia. Antibiotic course completed. Monitor.  4.  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 Dose changed to 15 mg daily.   Managed by CCM.  5.  Acute on chronic combined CHF. A. fib with RVR. CAD and HLD. Cardiology consulted. Continued on Lasix.  Changed to  p.o. Cardiology considering right and left heart cath tomorrow. Echocardiogram shows decreased EF of 35%. Continue to monitor on the telemetry.  6.  Dysphagia. Aphonia. Speech therapy following. Likely injury from intubation. Monitor. May require a PEG tube if not progressing well.  Monitor for now.  7.  Type 2 diabetes mellitus. Continue sliding scale insulin.  8.  Protein calorie malnutrition in the acute setting due to poor p.o. intake. Continue tube feeding for now.  9.  Acute encephalopathy. Likely multifactorial with deconditioning as well as recurrent intubation causing delirium along with hypercarbia. CT scan MRI brain negative for any acute intracranial abnormality. Minimize narcotics minimize sedating medication.  Monitor. Patient is hard of hearing, need to verify every time if there is a change in mental status whether that is because of patient's poor hearing.  10.  Acute axillary DVT as well as SVT on the right associated with PICC line. PICC line has been removed. Patient started on IV bivalirudin based on discussion with hematology.  has developed thrombocytopenia while was on heparin during the initial stay in the hospital.  HRT was mildly elevated SRA was negative. Discussed with CCM as well as pharmacy.  Bivalirudin was chosen since the patient will require cardiac catheterization on Wednesday.  11.  Thrombocytopenia. Unclear etiology but more likely bone marrow suppression. Workup related to hemolysis is negative.  Morphology on smear unremarkable.  Did receive heparin which made the count low but SRA is negative. Discussed with hematology Dr. Julien Nordmann on call recommend no further workup right now.  Diet: On tube feeding via cor trac DVT Prophylaxis: subcutaneous Heparin  Advance goals of care discussion: DNR DNI  Family Communication: no family was present at bedside, at the  time of interview.  Discussed with patient's husband on 06/12/2017  Disposition:   Discharge to be determined.   Consultants: CCM, cardiology Procedures: Echocardiogram, intubation , bronchoscopy  Antibiotics: Anti-infectives (From admission, onward)   Start     Dose/Rate Route Frequency Ordered Stop   06/05/17 2200  cefTRIAXone (ROCEPHIN) 1 g in dextrose 5 % 50 mL IVPB     1 g 100 mL/hr over 30 Minutes Intravenous Every 24 hours 06/05/17 1442 06/11/17 2359   06/03/17 0400  vancomycin (VANCOCIN) 1,250 mg in sodium chloride 0.9 % 250 mL IVPB  Status:  Discontinued     1,250 mg 166.7 mL/hr over 90 Minutes Intravenous Every 24 hours 06/02/17 0343 06/03/17 0919   06/02/17 0345  cefTAZidime (FORTAZ) 2 g in dextrose 5 % 50 mL IVPB  Status:  Discontinued     2 g 100 mL/hr over 30 Minutes Intravenous Every 12 hours 06/02/17 0343 06/05/17 1442   06/02/17 0345  vancomycin (VANCOCIN) 2,000 mg in sodium chloride 0.9 % 500 mL IVPB     2,000 mg 250 mL/hr over 120 Minutes Intravenous  Once 06/02/17 0343 06/02/17 0752   05/28/17 2000  fluconazole (DIFLUCAN) IVPB 100 mg     100 mg 50 mL/hr over 60 Minutes Intravenous  Once 05/28/17 1933 05/28/17 2119       Objective: Physical Exam:    06/13/17 0402 06/14/17 0500  Weight: 107.8 kg (237 lb 10.5 oz) 107.4 kg (236 lb 12.4 oz)   General: Alert, Awake and Oriented to Person. Appear in moderate distress, affect appropriate Eyes: PERRL, Conjunctiva normal ENT: Oral Mucosa clear moist. Neck: no JVD, no Abnormal Mass Or lumps Cardiovascular: S1 and S2 Present, no Murmur, Peripheral Pulses Present Respiratory: increased respiratory effort, Bilateral Air entry equal and Decreased, no use of accessory muscle, bilateral Crackles, Occasional wheezes Abdomen: Bowel Sound present, Soft and no tenderness, no hernia Skin: no redness, no Rash, no induration Extremities: no Pedal edema, no calf tenderness Neurologic: Grossly no focal neuro deficit. Bilaterally Equal motor strength  Data Reviewed: CBC: Unchanged leukocytosis, hemoglobin  stable.  Basic Metabolic Panel: Renal function stable.  Sodium getting better.    Scheduled Meds: . aspirin  81 mg Per Tube Daily  . bisoprolol  5 mg Per Tube Daily  . chlorhexidine gluconate (MEDLINE KIT)  15 mL Mouth Rinse BID  . Chlorhexidine Gluconate Cloth  6 each Topical Q0600  . cyanocobalamin  1,000 mcg Subcutaneous Daily  . ezetimibe  10 mg Per Tube QHS  . feeding supplement (PRO-STAT SUGAR FREE 64)  30 mL Per Tube TID  . folic acid  1 mg Per Tube Daily  . free water  300 mL Per Tube Q8H  . furosemide  40 mg Oral Daily  . hydrALAZINE  50 mg Per Tube Q8H  . insulin aspart  0-20 Units Subcutaneous Q4H  . insulin glargine  26 Units Subcutaneous Daily  . ipratropium-albuterol  3 mL Nebulization Q6H  . isosorbide dinitrate  20 mg Per Tube TID  . mouth rinse  15 mL Mouth Rinse QID  . montelukast  10 mg Per Tube QHS  . pantoprazole sodium  40 mg Per Tube Daily  . predniSONE  15 mg Per Tube Q breakfast  . sodium chloride flush  10-40 mL Intracatheter Q12H  . spironolactone  25 mg Per Tube Daily   Continuous Infusions: . sodium chloride 250 mL (06/11/17 1400)  . bivalirudin (ANGIOMAX) infusion 0.5 mg/mL (Non-ACS indications) 0.1 mg/kg/hr (06/14/17 2050)  .  feeding supplement (JEVITY 1.2 CAL) 1,000 mL (06/14/17 1712)   PRN Meds: sodium chloride, albuterol, haloperidol lactate, hydrALAZINE, metoprolol tartrate  Time spent: 35 minutes  Author: Berle Mull, MD [facility] Pager: [phone] 06/14/2017 5:59 PM  If 7PM-7AM, please contact night-coverage at [email or web address], password [facility]

## 2017-06-15 NOTE — Progress Notes (Signed)
ANTICOAGULATION CONSULT NOTE - Follow Up Consult  Pharmacy Consult for Bivairudin Indication: DVT  Allergies  Allergen Reactions  . Fish Allergy Anaphylaxis  . Shellfish Allergy Anaphylaxis  . Aspirin     REACTION: nausea and vomiting High doses  . Atorvastatin     REACTION: leg pain  . Crestor [Rosuvastatin Calcium] Other (See Comments)    Muscle pain - allergy/intolerance  . Penicillins     Due to mold allergy per pt  . Simvastatin     REACTION: muscle pain  . Trandolapril     REACTION: leg pain   Patient Measurements: Height: 5\' 4"  (162.6 cm) Weight: 233 lb 0.4 oz (105.7 kg) IBW/kg (Calculated) : 54.7  Vital Signs: Temp: 98.1 F (36.7 C) (11/20 1118) Temp Source: Oral (11/20 1118) BP: 131/100 (11/20 1118) Pulse Rate: 90 (11/20 1118)  Labs: Recent Labs    06/12/17 1702  06/13/17 1520 06/14/17 0500 06/14/17 2200 06/15/17 0634 06/15/17 1018  HGB  --    < > 11.2* 10.3*  --  10.8*  --   HCT  --   --  39.1 37.0  --  37.5  --   PLT  --   --  83* 85*  --  112*  --   APTT  --   --   --   --  50*  --  54*  CREATININE  --    < > 0.86 0.74  --  0.69  --   TROPONINI 0.07*  --   --   --   --   --   --    < > = values in this interval not displayed.   Estimated Creatinine Clearance: 69.8 mL/min (by C-G formula based on SCr of 0.69 mg/dL).  Medications: Bivalirudin @ 0.1mg /kg/hr  Assessment: 76yof admitted with respiratory distress, s/p VDRF now improved.  She had drop in PLTC while on heparin sq for VTE px and PLTCs have remained low despite off heparin.  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  Yesterday she was found to have new VTE at PICC line tip. Bivalirudin started per hematology recommendations.  APTT is therapeutic at 54 seconds. Hgb stable. No bleeding.  Goal of Therapy:  aPTT 50-85 sec seconds Monitor platelets by anticoagulation protocol: Yes   Plan:  1) Continue bivalirudin @ 0.1mg /kg/hr 2) Daily aPTT and CBC  Nena Jordan, PharmD, BCPS 06/15/17 11:41  AM

## 2017-06-16 ENCOUNTER — Encounter (HOSPITAL_COMMUNITY)
Admission: EM | Disposition: A | Discharge status: Skilled Nursing Facility | Payer: Self-pay | Source: Home / Self Care | Attending: Pulmonary Disease

## 2017-06-16 DIAGNOSIS — I200 Unstable angina: Secondary | ICD-10-CM

## 2017-06-16 DIAGNOSIS — I2510 Atherosclerotic heart disease of native coronary artery without angina pectoris: Secondary | ICD-10-CM

## 2017-06-16 HISTORY — PX: CORONARY STENT INTERVENTION: CATH118234

## 2017-06-16 HISTORY — PX: CORONARY ANGIOGRAPHY: CATH118303

## 2017-06-16 HISTORY — PX: RIGHT/LEFT HEART CATH AND CORONARY ANGIOGRAPHY: CATH118266

## 2017-06-16 LAB — POCT I-STAT 3, ART BLOOD GAS (G3+)
ACID-BASE EXCESS: 21 mmol/L — AB (ref 0.0–2.0)
BICARBONATE: 49.1 mmol/L — AB (ref 20.0–28.0)
O2 Saturation: 99 %
PH ART: 7.394 (ref 7.350–7.450)
TCO2: >50 mmol/L — ABNORMAL HIGH (ref 22–32)
pCO2 arterial: 80.3 mmHg (ref 32.0–48.0)
pO2, Arterial: 132 mmHg — ABNORMAL HIGH (ref 83.0–108.0)

## 2017-06-16 LAB — GLUCOSE, CAPILLARY
GLUCOSE-CAPILLARY: 127 mg/dL — AB (ref 65–99)
GLUCOSE-CAPILLARY: 74 mg/dL (ref 65–99)
GLUCOSE-CAPILLARY: 112 mg/dL — AB (ref 65–99)
Glucose-Capillary: 117 mg/dL — ABNORMAL HIGH (ref 65–99)
Glucose-Capillary: 164 mg/dL — ABNORMAL HIGH (ref 65–99)
Glucose-Capillary: 80 mg/dL (ref 65–99)

## 2017-06-16 LAB — CBC WITH DIFFERENTIAL/PLATELET
Basophils Absolute: 0 10*3/uL (ref 0.0–0.1)
Basophils Relative: 0 %
EOS ABS: 0.4 10*3/uL (ref 0.0–0.7)
Eosinophils Relative: 3 %
HEMATOCRIT: 35.2 % — AB (ref 36.0–46.0)
HEMOGLOBIN: 10.3 g/dL — AB (ref 12.0–15.0)
LYMPHS ABS: 1.8 10*3/uL (ref 0.7–4.0)
LYMPHS PCT: 15 %
MCH: 29.7 pg (ref 26.0–34.0)
MCHC: 29.3 g/dL — ABNORMAL LOW (ref 30.0–36.0)
MCV: 101.4 fL — AB (ref 78.0–100.0)
Monocytes Absolute: 0.5 10*3/uL (ref 0.1–1.0)
Monocytes Relative: 4 %
NEUTROS ABS: 9.5 10*3/uL — AB (ref 1.7–7.7)
NEUTROS PCT: 78 %
Platelets: 111 10*3/uL — ABNORMAL LOW (ref 150–400)
RBC: 3.47 MIL/uL — AB (ref 3.87–5.11)
RDW: 16.6 % — ABNORMAL HIGH (ref 11.5–15.5)
WBC: 12.2 10*3/uL — AB (ref 4.0–10.5)

## 2017-06-16 LAB — COMPREHENSIVE METABOLIC PANEL
ALBUMIN: 2.5 g/dL — AB (ref 3.5–5.0)
ALK PHOS: 63 U/L (ref 38–126)
ALT: 34 U/L (ref 14–54)
ANION GAP: 7 (ref 5–15)
AST: 19 U/L (ref 15–41)
BILIRUBIN TOTAL: 0.5 mg/dL (ref 0.3–1.2)
BUN: 34 mg/dL — ABNORMAL HIGH (ref 6–20)
CALCIUM: 8.5 mg/dL — AB (ref 8.9–10.3)
CO2: 38 mmol/L — ABNORMAL HIGH (ref 22–32)
Chloride: 100 mmol/L — ABNORMAL LOW (ref 101–111)
Creatinine, Ser: 0.71 mg/dL (ref 0.44–1.00)
GLUCOSE: 124 mg/dL — AB (ref 65–99)
POTASSIUM: 3.5 mmol/L (ref 3.5–5.1)
Sodium: 145 mmol/L (ref 135–145)
TOTAL PROTEIN: 4.9 g/dL — AB (ref 6.5–8.1)

## 2017-06-16 LAB — PROTIME-INR
INR: 1.45
PROTHROMBIN TIME: 17.5 s — AB (ref 11.4–15.2)

## 2017-06-16 LAB — POCT ACTIVATED CLOTTING TIME: ACTIVATED CLOTTING TIME: 257 s

## 2017-06-16 LAB — APTT: aPTT: 55 seconds — ABNORMAL HIGH (ref 24–36)

## 2017-06-16 SURGERY — RIGHT/LEFT HEART CATH AND CORONARY ANGIOGRAPHY
Anesthesia: LOCAL

## 2017-06-16 MED ORDER — GERHARDT'S BUTT CREAM
TOPICAL_CREAM | Freq: Four times a day (QID) | CUTANEOUS | Status: DC | PRN
  Administered 2017-06-17: 1 via TOPICAL
  Filled 2017-06-16 (×2): qty 1

## 2017-06-16 MED ORDER — ASPIRIN 81 MG PO CHEW: 81.0000 mg | CHEWABLE_TABLET | Freq: Every day | ORAL | Status: DC

## 2017-06-16 MED ORDER — CHLORHEXIDINE GLUCONATE 0.12 % MT SOLN
15.0000 mL | Freq: Two times a day (BID) | OROMUCOSAL | Status: DC
  Administered 2017-06-16 – 2017-06-26 (×19): 15 mL via OROMUCOSAL
  Filled 2017-06-16 (×20): qty 15

## 2017-06-16 MED ORDER — HEPARIN SODIUM (PORCINE) 1000 UNIT/ML IJ SOLN
INTRAMUSCULAR | Status: DC | PRN
  Administered 2017-06-16: 4500 [IU] via INTRAVENOUS

## 2017-06-16 MED ORDER — SODIUM CHLORIDE 0.9 % IV SOLN: 250.0000 mL | INTRAVENOUS | Status: DC | PRN

## 2017-06-16 MED ORDER — HEPARIN (PORCINE) IN NACL 2-0.9 UNIT/ML-% IJ SOLN
INTRAMUSCULAR | Status: DC | PRN
  Administered 2017-06-16: 10 mL via INTRA_ARTERIAL

## 2017-06-16 MED ORDER — HEPARIN (PORCINE) IN NACL 2-0.9 UNIT/ML-% IJ SOLN
INTRAMUSCULAR | Status: AC | PRN
  Administered 2017-06-16: 1000 mL

## 2017-06-16 MED ORDER — SODIUM CHLORIDE 0.9% FLUSH: 3.0000 mL | INTRAVENOUS | Status: DC | PRN

## 2017-06-16 MED ORDER — NITROGLYCERIN 1 MG/10 ML FOR IR/CATH LAB
INTRA_ARTERIAL | Status: AC
Start: 2017-06-16 — End: 2017-06-16
  Filled 2017-06-16: qty 10

## 2017-06-16 MED ORDER — IOPAMIDOL (ISOVUE-370) INJECTION 76%
INTRAVENOUS | Status: DC | PRN
  Administered 2017-06-16: 95 mL

## 2017-06-16 MED ORDER — SODIUM CHLORIDE 0.9 % IV SOLN
0.1500 mg/kg/h | INTRAVENOUS | Status: DC
  Administered 2017-06-16: 0.1 mg/kg/h via INTRAVENOUS
  Administered 2017-06-17: 0.12 mg/kg/h via INTRAVENOUS
  Filled 2017-06-16 (×2): qty 250

## 2017-06-16 MED ORDER — ORAL CARE MOUTH RINSE
15.0000 mL | Freq: Two times a day (BID) | OROMUCOSAL | Status: DC
  Administered 2017-06-17 – 2017-06-25 (×16): 15 mL via OROMUCOSAL

## 2017-06-16 MED ORDER — TICAGRELOR 90 MG PO TABS
ORAL_TABLET | ORAL | Status: DC | PRN
  Administered 2017-06-16: 180 mg via NASOGASTRIC

## 2017-06-16 MED ORDER — LIDOCAINE HCL (PF) 1 % IJ SOLN
INTRAMUSCULAR | Status: AC
  Filled 2017-06-16: qty 30

## 2017-06-16 MED ORDER — LIDOCAINE HCL (PF) 1 % IJ SOLN
INTRAMUSCULAR | Status: DC | PRN
  Administered 2017-06-16: 2 mL

## 2017-06-16 MED ORDER — TICAGRELOR 90 MG PO TABS
ORAL_TABLET | ORAL | Status: AC
  Filled 2017-06-16: qty 1

## 2017-06-16 MED ORDER — SODIUM CHLORIDE 0.9% FLUSH
3.0000 mL | Freq: Two times a day (BID) | INTRAVENOUS | Status: DC
  Administered 2017-06-17 – 2017-06-21 (×7): 3 mL via INTRAVENOUS

## 2017-06-16 MED ORDER — VERAPAMIL HCL 2.5 MG/ML IV SOLN
INTRAVENOUS | Status: AC
  Filled 2017-06-16: qty 2

## 2017-06-16 MED ORDER — HEPARIN SODIUM (PORCINE) 1000 UNIT/ML IJ SOLN
INTRAMUSCULAR | Status: AC
  Filled 2017-06-16: qty 1

## 2017-06-16 MED ORDER — TICAGRELOR 90 MG PO TABS
90.0000 mg | ORAL_TABLET | Freq: Two times a day (BID) | ORAL | Status: DC
  Administered 2017-06-17 (×2): 90 mg via ORAL
  Filled 2017-06-16 (×2): qty 1

## 2017-06-16 MED ORDER — SODIUM CHLORIDE 0.9 % IV SOLN
INTRAVENOUS | Status: AC
  Administered 2017-06-16: 18:00:00 via INTRAVENOUS

## 2017-06-16 MED ORDER — HEPARIN (PORCINE) IN NACL 2-0.9 UNIT/ML-% IJ SOLN
INTRAMUSCULAR | Status: AC
  Filled 2017-06-16: qty 1000

## 2017-06-16 SURGICAL SUPPLY — 17 items
BALLN SAPPHIRE 2.5X12 (BALLOONS) ×2
BALLN ~~LOC~~ EUPHORA RX 3.25X15 (BALLOONS) ×2
BALLOON SAPPHIRE 2.5X12 (BALLOONS) IMPLANT
BALLOON ~~LOC~~ EUPHORA RX 3.25X15 (BALLOONS) IMPLANT
CATH 5FR JL3.5 JR4 ANG PIG MP (CATHETERS) ×1 IMPLANT
CATH LAUNCHER 6FR AL.75 (CATHETERS) ×1 IMPLANT
DEVICE RAD COMP TR BAND LRG (VASCULAR PRODUCTS) ×1 IMPLANT
GLIDESHEATH SLEND SS 6F .021 (SHEATH) ×1 IMPLANT
GUIDEWIRE INQWIRE 1.5J.035X260 (WIRE) IMPLANT
INQWIRE 1.5J .035X260CM (WIRE) ×2
KIT ENCORE 26 ADVANTAGE (KITS) ×1 IMPLANT
KIT HEART LEFT (KITS) ×2 IMPLANT
PACK CARDIAC CATHETERIZATION (CUSTOM PROCEDURE TRAY) ×2 IMPLANT
STENT RESOLUTE ONYX 3.0X18 (Permanent Stent) ×1 IMPLANT
TRANSDUCER W/STOPCOCK (MISCELLANEOUS) ×2 IMPLANT
TUBING CIL FLEX 10 FLL-RA (TUBING) ×2 IMPLANT
WIRE RUNTHROUGH .014X180CM (WIRE) ×1 IMPLANT

## 2017-06-16 NOTE — Progress Notes (Signed)
PT currently on Bipap tolerating well unable to perform flutter with patient at this time.  Patient Stable on Bipap machine. RT will continue to monitor.

## 2017-06-16 NOTE — Progress Notes (Signed)
0215 Pt. Had an episode of unsustained bradycardia where her heart rate dropped down to 41 but quickly returned to normal.

## 2017-06-16 NOTE — Progress Notes (Signed)
OT Cancellation Note  Patient Details Name: Kelli Velasquez MRN: [id number] DOB: 1940/12/30   Cancelled Treatment:    Reason Eval/Treat Not Completed: Patient at procedure or test/ unavailable. OT will continue to follow in the acute setting.  [place] 06/16/2017, 3:50 PM  Hulda Humphrey OTR/L [phone]

## 2017-06-16 NOTE — Progress Notes (Addendum)
ANTICOAGULATION CONSULT NOTE - Follow Up Consult  Pharmacy Consult for Bivalirudin Indication: DVT  Allergies  Allergen Reactions  . Fish Allergy Anaphylaxis  . Shellfish Allergy Anaphylaxis  . Aspirin     REACTION: nausea and vomiting High doses  . Atorvastatin     REACTION: leg pain  . Crestor [Rosuvastatin Calcium] Other (See Comments)    Muscle pain - allergy/intolerance  . Penicillins     Due to mold allergy per pt  . Simvastatin     REACTION: muscle pain  . Trandolapril     REACTION: leg pain   Patient Measurements: Height: 5\' 4"  (162.6 cm) Weight: 243 lb (110.2 kg) IBW/kg (Calculated) : 54.7  Vital Signs: Temp: 98.3 F (36.8 C) (11/21 0322) Temp Source: Axillary (11/21 0322) BP: 140/54 (11/21 0628) Pulse Rate: 73 (11/21 0322)  Labs: Recent Labs    06/14/17 0500 06/14/17 2200 06/15/17 0634 06/15/17 1018 06/16/17 0235  HGB 10.3*  --  10.8*  --  10.3*  HCT 37.0  --  37.5  --  35.2*  PLT 85*  --  112*  --  111*  APTT  --  50*  --  54* 55*  LABPROT  --   --   --   --  17.5*  INR  --   --   --   --  1.45  CREATININE 0.74  --  0.69  --  0.71   Estimated Creatinine Clearance: 71.5 mL/min (by C-G formula based on SCr of 0.71 mg/dL).  Medications: Bivalirudin @ 0.1mg /kg/hr  Assessment: 76yof admitted with respiratory distress, s/p VDRF now improved.  She had drop in PLTC while on heparin sq for VTE px and PLTCs have remained low despite off heparin.  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  On 11/19 she was found to have new VTE at PICC line tip. Bivalirudin started per hematology recommendations.  APTT is therapeutic at 55 seconds. Hgb stable. No bleeding. Plan for cath today. Noted plan for eventual DOAC.   Goal of Therapy:  aPTT 50-85 sec seconds Monitor platelets by anticoagulation protocol: Yes   Plan:  1) Continue bivalirudin @ 0.1mg /kg/hr 2) Follow up after cath  Nena Jordan, PharmD, BCPS 06/16/17 7:18 AM     ADDENDUM: S/p cath with stent to RCA.  Bivalirudin to resume tonight at 2230 per Dr. Fletcher Anon. Will resume at previous therapeutic rate 0.1mg /kg/hr and follow daily aPTTs.  Nena Jordan, PharmD, BCPS 06/16/2017, 3:17 PM

## 2017-06-16 NOTE — Progress Notes (Signed)
PROGRESS NOTE    Kelli Velasquez  GLO:[id number] DOB: 1941/06/22 DOA: 05/26/2017 PCP: Abner Greenspan, MD      Brief Narrative:  76 yo F with CHF, A. fib, CAD, HTN, BOOP admitted on 10/31 with SOB, was found to have MSSA pneumonia with acute respiratory failure --> intubation and extubated on 05/30/2017.  Later patient had another episode of respiratory distress requiring repeat intubation on 06/02/2017.  Currently extubated on 06/07/2017 remains on stepdown unit.  After repeat extubation patient remains aphonic. Bronchoscopy performed on 06/02/2017, cultures so far are negative.  Sputum culture is growing MSSA patient completed antibiotic course on 06/11/2017. Also found to have acute systolic CHF, cardiology consulted and currently on IV Lasix recommend inpatient cardiac cath.  Currently further plan is continue monitoring for improvement in voice quality as well as further cardiac workup.          Assessment & Plan:  Principal Problem:   Acute respiratory failure with hypercapnia (HCC) Active Problems:   Hyperlipidemia   Severe obesity (BMI >= 40) (HCC)   Essential hypertension   CAD, NATIVE VESSEL   OVERACTIVE BLADDER   Pulmonary nodules c/w Nodular BOOP   Acute on chronic systolic congestive heart failure (HCC)   COPD exacerbation (HCC)   Unstable angina ([place])   1. Acute hypoxic respiratory failure MSSA pneumonia, BOOP, acute on chronic combined systolic and diastolic CHF -BiPAP nightly -Continue diuresis per cardiology  2. MSSA pneumonia: Completed antibiotics  3.  Acute on chronic combined systolic and diastolic CHF: Is in the setting of A. fib with RVR, and CAD.  Echo showed EF 35%. Cath today showed trace single vessel disease, stented.   -Consult cardiology, appreciate recommendations -Continue Lasix per cardiology -Continue hydralazine, Isordil, Spironolactone, and BB per Cardiology  4. History of BOOP -Continue prednisone  5. Dysphagia and aphonia -SLP  consult  6. Diabetes -Continue SSI  7. Severe protein calorie malnutrition -Continue tube feeds per NG  8. DVT: UE DVT in setting of PICC.  Thrombocytopenia with negative HIT panel, avoiding heparins. -Continue bivalirudin, plan for transition to NOAC per Cardiology    DVT prophylaxis: Bivalirudin Code Status: DNR Family Communication: None present Disposition Plan: To stepdown for post-cath treatment, then likely diurese additionally per Cardiology.     Consultants:   Cardiology  CCM  Procedures:   Echocardiogram  Bronchoscopy  Intubation  Antimicrobials:   Ceftazidime 11/6 >> 11/10  Ceftriaxone 11/10 >> 11/16    Subjective: [place] to my voice, but confused, aphonic.    Objective: Vitals:   06/16/17 1520 06/16/17 1525 06/16/17 1530 06/16/17 1535  BP:  (!) 213/198 134/80 (!) 160/77  Pulse: 83 72 79 78  Resp: 17 20 20  (!) 21  Temp:      TempSrc:      SpO2: 100% 100% 100% 100%  Weight:      Height:        Intake/Output Summary (Last 24 hours) at 06/16/2017 1631 Last data filed at 06/16/2017 0100 Gross per 24 hour  Intake 1880 ml  Output 400 ml  Net 1480 ml   Filed Weights   06/14/17 0500 06/15/17 0444 06/16/17 0600  Weight: 107.4 kg (236 lb 12.4 oz) 105.7 kg (233 lb 0.4 oz) 110.2 kg (243 lb)    Examination: General appearance: Elderly obese adult female, awake but confused, mittens on hands.   HEENT: Anicteric, conjunctiva pink, lids and lashes normal. No nasal deformity, discharge, epistaxis.  Lips moist, tongue green.   Skin: Warm and  dry.  No suspicious rashes or lesions. Cardiac: RRR, nl S1-S2, no murmurs appreciated.  Capillary refill is brisk.  JVP not visible.  No LE edema.   Respiratory: Normal respiratory rate and rhythm.  CTAB without rales or wheezes. Abdomen: Abdomen soft.  No TTP. No ascites, distension, hepatosplenomegaly.   MSK: No deformities or effusions. Neuro: Awake, but confused, not able to speak.  EOMI, moves all  extremities. Psych: Unable to assess.   Data Reviewed: I have personally reviewed following labs and imaging studies:  CBC: Recent Labs  Lab 06/12/17 0327 06/13/17 1520 06/14/17 0500 06/15/17 0634 06/16/17 0235  WBC 18.5* 16.7* 15.6* 15.1* 12.2*  NEUTROABS 16.2* 15.5* 12.7* 11.6* 9.5*  HGB 11.2* 11.2* 10.3* 10.8* 10.3*  HCT 39.2 39.1 37.0 37.5 35.2*  MCV 102.6* 104.3* 103.1* 101.6* 101.4*  PLT 78* 83* 85* 112* 224*   Basic Metabolic Panel: Recent Labs  Lab 06/12/17 0327 06/13/17 0600 06/13/17 1520 06/14/17 0500 06/15/17 0634 06/16/17 0235  NA 153* 154* 151* 147* 149* 145  K 3.6 3.7 4.1 3.5 3.6 3.5  CL 108 109 104 101 104 100*  CO2 38* 39* 39* 39* 37* 38*  GLUCOSE 85 200* 203* 154* 123* 124*  BUN 43* 40* 37* 35* 32* 34*  CREATININE 0.79 0.81 0.86 0.74 0.69 0.71  CALCIUM 8.9 9.2 9.2 8.8* 8.9 8.5*  MG 2.5*  --   --   --  2.4  --    GFR: Estimated Creatinine Clearance: 71.5 mL/min (by C-G formula based on SCr of 0.71 mg/dL). Liver Function Tests: Recent Labs  Lab 06/13/17 1520 06/15/17 0634 06/16/17 0235  AST 25 25 19   ALT 33 40 34  ALKPHOS 58 59 63  BILITOT 0.9 0.8 0.5  PROT 5.5* 5.2* 4.9*  ALBUMIN 2.8* 2.6* 2.5*   No results for input(s): LIPASE, AMYLASE in the last 168 hours. No results for input(s): AMMONIA in the last 168 hours. Coagulation Profile: Recent Labs  Lab 06/16/17 0235  INR 1.45   Cardiac Enzymes: Recent Labs  Lab 06/12/17 0327 06/12/17 0748 06/12/17 1702  TROPONINI 0.07* 0.07* 0.07*   BNP (last 3 results) Recent Labs    01/11/17 1053  PROBNP 264.0*   HbA1C: No results for input(s): HGBA1C in the last 72 hours. CBG: Recent Labs  Lab 06/15/17 2312 06/16/17 0325 06/16/17 0751 06/16/17 1145 06/16/17 1617  GLUCAP 109* 127* 74 117* 164*   Lipid Profile: No results for input(s): CHOL, HDL, LDLCALC, TRIG, CHOLHDL, LDLDIRECT in the last 72 hours. Thyroid Function Tests: Recent Labs    06/15/17 0634  TSH 1.452    Anemia Panel: Recent Labs    06/15/17 0634  VITAMINB12 945*   Urine analysis:    Component Value Date/Time   COLORURINE YELLOW 07/30/2009 1435   APPEARANCEUR CLEAR 07/30/2009 1435   LABSPEC 1.015 07/30/2009 1435   PHURINE 6.0 07/30/2009 1435   GLUCOSEU NEGATIVE 07/30/2009 1435   HGBUR NEGATIVE 07/30/2009 1435   BILIRUBINUR neg. 06/28/2013 1041   KETONESUR NEGATIVE 07/30/2009 1435   PROTEINUR neg. 06/28/2013 1041   PROTEINUR NEGATIVE 07/30/2009 1435   UROBILINOGEN 0.2 06/28/2013 1041   UROBILINOGEN 0.2 07/30/2009 1435   NITRITE neg. 06/28/2013 1041   NITRITE NEGATIVE 07/30/2009 1435   LEUKOCYTESUR Negative 06/28/2013 1041   Sepsis Labs: @LABRCNTIP (procalcitonin:4,lacticidven:4)  )No results found for this or any previous visit (from the past 240 hour(s)).       Radiology Studies: Dg Chest Port 1 View  Result Date: 06/15/2017 CLINICAL DATA:  Respiratory  failure EXAM: PORTABLE CHEST 1 VIEW COMPARISON:  Yesterday FINDINGS: Right upper extremity PICC with tip at the SVC level. An orogastric tube reaches the stomach. Low volume chest with indistinct opacities at the bases. Possible cardiomegaly. No visible effusion or pneumothorax. IMPRESSION: Low volume chest with atelectasis or pneumonia at the bases. No change from yesterday. Electronically Signed   By: Monte Fantasia M.D.   On: 06/15/2017 09:10        Scheduled Meds: . [MAR Hold] aspirin  81 mg Per Tube Daily  . [MAR Hold] bisoprolol  5 mg Per Tube Daily  . [MAR Hold] chlorhexidine gluconate (MEDLINE KIT)  15 mL Mouth Rinse BID  . [MAR Hold] cyanocobalamin  1,000 mcg Subcutaneous Daily  . [MAR Hold] ezetimibe  10 mg Per Tube QHS  . [MAR Hold] feeding supplement (PRO-STAT SUGAR FREE 64)  30 mL Per Tube TID  . [MAR Hold] folic acid  1 mg Per Tube Daily  . [MAR Hold] free water  300 mL Per Tube Q8H  . [MAR Hold] furosemide  40 mg Oral Daily  . [MAR Hold] hydrALAZINE  50 mg Per Tube Q8H  . [MAR Hold] insulin  aspart  0-20 Units Subcutaneous Q4H  . [MAR Hold] insulin glargine  26 Units Subcutaneous Daily  . [MAR Hold] ipratropium-albuterol  3 mL Nebulization Q6H  . [MAR Hold] isosorbide dinitrate  20 mg Per Tube TID  . [MAR Hold] mouth rinse  15 mL Mouth Rinse QID  . [MAR Hold] montelukast  10 mg Per Tube QHS  . [MAR Hold] pantoprazole sodium  40 mg Per Tube Daily  . [MAR Hold] predniSONE  15 mg Per Tube Q breakfast  . [MAR Hold] sodium chloride flush  10-40 mL Intracatheter Q12H  . sodium chloride flush  3 mL Intravenous Q12H  . [MAR Hold] spironolactone  25 mg Per Tube Daily   Continuous Infusions: . [MAR Hold] sodium chloride 250 mL (06/11/17 1400)  . sodium chloride    . sodium chloride 10 mL/hr at 06/16/17 0610  . bivalirudin (ANGIOMAX) infusion 0.5 mg/mL (Non-ACS indications)    . feeding supplement (JEVITY 1.2 CAL) 1,000 mL (06/14/17 1712)     LOS: 21 days    Time spent: 25 minutes    Edwin Dada, MD [facility] Pager [phone]  If 7PM-7AM, please contact night-coverage [email or web address] Password [email or web address] 06/16/2017, 4:31 PM

## 2017-06-16 NOTE — Progress Notes (Signed)
Patient sleeping comfortably in no respiratory distress. Given poor cooperation with Acapella this morning choice was made to not attempt Acapella at this time.

## 2017-06-16 NOTE — Progress Notes (Signed)
Patient ID: SHAWNETTA LEIN, female   DOB: Nov 05, 1940, 76 y.o.   MRN: [id number]      Advanced Heart Failure Rounding Note  Subjective:    Over 2 weeks prior to admission, she had increased dyspnea with exertion, she was short of breath at rest on the day of admission.  Her husband says she has been taking 20 mg of lasix daily and occasionally an extra 20 mg of lasix.   Presented to [facility] ED with respiratory distress. Family reported increased dyspnea over the last week requiring 2 liters oxygen at home. In ED she received solumedrol and duonebs. Placed on Bipap but continue decline requiring intubation. CXR concerning for interstitial edema and bilateral effusions.  Extubated 11/4. Noted to have swelling of upper airways on speech/swallow evaluation 11/6.  Developed respiratory distress 11/6 in the evening with hypoxia and hypercapnia and was re-intubated.    Extubated 06/07/17.   CXR 06/09/17 with LLL PNA.  She has completed abx for MSSA PNA.   UE [place] 06/14/17 with non-occlusive DVT in right axillary vein. Noted around PICC line in right basilic vein, and short segment of SVT noted in the forearm within the cephalic vein. LUE negative  BLE [place] 06/14/17 negative for DVTs.  Sleeping on my arrival. Awakens easily. Denies SOB. Ready for cath this afternoon.  Creatinine stable. K 3.5  Cardiac Studies: Echo 05/26/2017 EF 35-40%  Grade I DD RV normal Echo 2017 EF 45-50% with WMA Myoview 2017 No significant ischemia EF 48%.  [place] 2011- DES LAD and RCA.   Objective:   Weight Range: 243 lb (110.2 kg) Body mass index is 41.71 kg/m.   Vital Signs:   Temp:  [97.9 F (36.6 C)-98.4 F (36.9 C)] 98.1 F (36.7 C) (11/21 0754) Pulse Rate:  [60-90] 79 (11/21 0754) Resp:  [21-33] 33 (11/21 0754) BP: (98-142)/(52-100) 142/61 (11/21 0754) SpO2:  [86 %-100 %] 98 % (11/21 0809) Weight:  [243 lb (110.2 kg)] 243 lb (110.2 kg) (11/21 0600) Last BM Date: 06/15/17  Weight change: Filed Weights   06/14/17 0500 06/15/17 0444 06/16/17 0600  Weight: 236 lb 12.4 oz (107.4 kg) 233 lb 0.4 oz (105.7 kg) 243 lb (110.2 kg)   Intake/Output:   Intake/Output Summary (Last 24 hours) at 06/16/2017 0906 Last data filed at 06/16/2017 0100 Gross per 24 hour  Intake 1880 ml  Output 875 ml  Net 1005 ml    Physical Exam   General: Chronically ill and elderly appearing. NAD. Hoarse. HEENT: Tongue discoloration -> Green. Neck: Supple. JVP difficult. Carotids 2+ bilat; no bruits. No thyromegaly or nodule noted. Cor: PMI nondisplaced. RRR, No M/G/R noted Lungs: Diminished. On 3L O2 via [place]. Abdomen: Soft, non-tender, non-distended, no HSM. No bruits or masses. +BS  Extremities: No cyanosis, clubbing, or rash. R and LLE no edema.  Neuro: Alert & orientedx3, cranial nerves grossly intact. moves all 4 extremities w/o difficulty. Affect pleasant   Telemetry   NSR 70s, Personally reviewed.   Labs    CBC Recent Labs    06/15/17 0634 06/16/17 0235  WBC 15.1* 12.2*  NEUTROABS 11.6* 9.5*  HGB 10.8* 10.3*  HCT 37.5 35.2*  MCV 101.6* 101.4*  PLT 112* 665*   Basic Metabolic Panel Recent Labs    06/15/17 0634 06/16/17 0235  NA 149* 145  K 3.6 3.5  CL 104 100*  CO2 37* 38*  GLUCOSE 123* 124*  BUN 32* 34*  CREATININE 0.69 0.71  CALCIUM 8.9 8.5*  MG 2.4  --  Liver Function Tests Recent Labs    06/15/17 0634 06/16/17 0235  AST 25 19  ALT 40 34  ALKPHOS 59 63  BILITOT 0.8 0.5  PROT 5.2* 4.9*  ALBUMIN 2.6* 2.5*   No results for input(s): LIPASE, AMYLASE in the last 72 hours. Cardiac Enzymes No results for input(s): CKTOTAL, CKMB, CKMBINDEX, TROPONINI in the last 72 hours. BNP: BNP (last 3 results) Recent Labs    05/26/17 0610  BNP 760.9*   ProBNP (last 3 results) Recent Labs    01/11/17 1053  PROBNP 264.0*   D-Dimer No results for input(s): DDIMER in the last 72 hours. Hemoglobin A1C No results for input(s): HGBA1C in the last 72 hours. Fasting Lipid Panel No  results for input(s): CHOL, HDL, LDLCALC, TRIG, CHOLHDL, LDLDIRECT in the last 72 hours. Thyroid Function Tests Recent Labs    06/15/17 0634  TSH 1.452    Other results:  Imaging   No results found.  Medications:     Scheduled Medications: . aspirin  81 mg Per Tube Daily  . bisoprolol  5 mg Per Tube Daily  . chlorhexidine gluconate (MEDLINE KIT)  15 mL Mouth Rinse BID  . cyanocobalamin  1,000 mcg Subcutaneous Daily  . ezetimibe  10 mg Per Tube QHS  . feeding supplement (PRO-STAT SUGAR FREE 64)  30 mL Per Tube TID  . folic acid  1 mg Per Tube Daily  . free water  300 mL Per Tube Q8H  . furosemide  40 mg Oral Daily  . hydrALAZINE  50 mg Per Tube Q8H  . insulin aspart  0-20 Units Subcutaneous Q4H  . insulin glargine  26 Units Subcutaneous Daily  . ipratropium-albuterol  3 mL Nebulization Q6H  . isosorbide dinitrate  20 mg Per Tube TID  . mouth rinse  15 mL Mouth Rinse QID  . montelukast  10 mg Per Tube QHS  . pantoprazole sodium  40 mg Per Tube Daily  . predniSONE  15 mg Per Tube Q breakfast  . sodium chloride flush  10-40 mL Intracatheter Q12H  . sodium chloride flush  3 mL Intravenous Q12H  . spironolactone  25 mg Per Tube Daily    Infusions: . sodium chloride 250 mL (06/11/17 1400)  . sodium chloride    . sodium chloride 10 mL/hr at 06/16/17 0610  . bivalirudin (ANGIOMAX) infusion 0.5 mg/mL (Non-ACS indications) 0.1 mg/kg/hr (06/15/17 1901)  . feeding supplement (JEVITY 1.2 CAL) 1,000 mL (06/14/17 1712)    PRN Medications: sodium chloride, sodium chloride, albuterol, haloperidol lactate, hydrALAZINE, metoprolol tartrate, sodium chloride flush  Patient Profile   Ms Menchaca is a 76 year old with a history of CAD DES to LAD and RCA,  HTN, BOOP on chronic prednisone, and chronic respiratory failure.  Admitted with acute/chronic respiratory failure  Assessment/Plan   1. Acute/chronic systolic CHF: Probably ischemic cardiomyopathy given prior history.  Echo this  admission with EF down to 35-40% range, around 50% in past.  She was admitted with worsening dyspnea, elevated BNP, CXR with pulmonary edema.  She has been diuresed while here, now on po Lasix. Volume status seems ok at this point, weight trending down.  - Continue lasix 40 mg daily.  - Plan for [facility] today => assess filling pressures, see if progressive CAD has caused fall in EF/worsening HF.  Have discussed risks/benefits with patient and her family today, they agree to proceed. - Continue hydral 50 mg TID - Continue isordil 20 mg TID.  - Continue spironolactone 25  mg daily.  - Continue  bisoprolol 5 mg daily (no wheezing so far, more beta-1 specific beta blocker).  - Eventually can transition hydralazine/isordil over to [facility] with stabilized creatinine but will wait until after cath.  2. Acute hypoxemic respiratory failure: Initially thought multifactorial due to pulmonary edema, effusions, BOOP and OHS.  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  She was diuresed.  At baseline, she is on 2L home oxygen.  Extubated 11/4 but developed respiratory distress 11/6 and re-intubated.  She had had swelling of the upper airways noted by speech/swallow exam.  Found to have MSSA PNA.  She has now completed antibiotic course.   - Improved. Back down to 3L O2 by JAARS.  3. CAD: h/o DES to LAD and RCA in 2011.  No chest pain but admitted with severe dyspnea/volume overload and fall in EF on echo.  Troponin mildly elevated but no trend. Suspect most likely demand ischemia from volume overload.  - Given fall in EF and now stable pulmonary status, will plan [facility] for today.   4.  AKI:  - Resolved. 5. HTN:  - Ranging from 90s to 140s. Labile. Meds as above.  6. Code status: DNR/DNI at this time. No change.  7. NSVT: No recurrence. No change. 8. DVT: RUE, triggered by PICC. Started on bivalirudin 06/14/17 on hematology recommendation.   - PICC has been removed.  - Given triggered RUE DVT, would favor 3 months  anticoagulation with Xarelto or Eliquis. No change.  9. Mouth discoloration - Her tongue is green. ? From swallow study, or if thrush. Per primary.   Cath this afternoon.   Length of Stay: [place], [place]  06/16/2017 9:06 AM  Advanced Heart Failure Team Pager [phone] (M-F; 7a - 4p)  Please contact [facility] for night-coverage after hours (4p -7a ) and weekends on [email or web address]  Patient seen with PA, agree with the above note.  She is awake, hard to tell level of understanding today as hard of hearing and very hoarse when she tries to talk.  We have discussed cath with family again.  Creatinine improved.   Will do left heart cath today, unable to get peripheral IV in [facility] for RHC and want to avoid very thick neck and groin.  Will get LVEDP on [facility].    PICC is out, she has been anticoagulated for RUE DVT.  Will need anticoagulation for 3 months with triggered DVT.   Loralie Champagne 06/16/2017 1:33 PM

## 2017-06-16 NOTE — Progress Notes (Signed)
Patient uncooperative with Acapella mucus clearing device. Unable to get patient to participate and multiple attempts by patient to push it away from her mouth.

## 2017-06-16 NOTE — Progress Notes (Signed)
RT Note: Patient was transported from Cath Lab holding to [id number] with no complications. Patient was transported on her Bipap machine and tolerated the transport well. She was set up in her room and continues to maintain well on Bipap. Rt will continue to monitor.

## 2017-06-17 LAB — GLUCOSE, CAPILLARY
GLUCOSE-CAPILLARY: 97 mg/dL (ref 65–99)
Glucose-Capillary: 69 mg/dL (ref 65–99)
Glucose-Capillary: 78 mg/dL (ref 65–99)
Glucose-Capillary: 193 mg/dL — ABNORMAL HIGH (ref 65–99)
Glucose-Capillary: 213 mg/dL — ABNORMAL HIGH (ref 65–99)
Glucose-Capillary: 157 mg/dL — ABNORMAL HIGH (ref 65–99)

## 2017-06-17 LAB — BASIC METABOLIC PANEL
ANION GAP: 8 (ref 5–15)
BUN: 22 mg/dL — AB (ref 6–20)
CALCIUM: 8.3 mg/dL — AB (ref 8.9–10.3)
CO2: 35 mmol/L — ABNORMAL HIGH (ref 22–32)
CREATININE: 0.64 mg/dL (ref 0.44–1.00)
Chloride: 103 mmol/L (ref 101–111)
GFR calc Af Amer: >60 mL/min (ref >60)
GLUCOSE: 96 mg/dL (ref 65–99)
Potassium: 3.5 mmol/L (ref 3.5–5.1)
Sodium: 146 mmol/L — ABNORMAL HIGH (ref 135–145)

## 2017-06-17 LAB — CBC WITH DIFFERENTIAL/PLATELET
BASOS ABS: 0 10*3/uL (ref 0.0–0.1)
Basophils Relative: 0 %
EOS PCT: 3 %
Eosinophils Absolute: 0.3 10*3/uL (ref 0.0–0.7)
HCT: 35.6 % — ABNORMAL LOW (ref 36.0–46.0)
Hemoglobin: 10.6 g/dL — ABNORMAL LOW (ref 12.0–15.0)
Lymphocytes Relative: 11 %
Lymphs Abs: 1.1 10*3/uL (ref 0.7–4.0)
MCH: 30 pg (ref 26.0–34.0)
MCHC: 29.8 g/dL — AB (ref 30.0–36.0)
MCV: 100.8 fL — AB (ref 78.0–100.0)
MONO ABS: 0.6 10*3/uL (ref 0.1–1.0)
Monocytes Relative: 6 %
Neutro Abs: 8.1 10*3/uL — ABNORMAL HIGH (ref 1.7–7.7)
Neutrophils Relative %: 80 %
PLATELETS: 112 10*3/uL — AB (ref 150–400)
RBC: 3.53 MIL/uL — ABNORMAL LOW (ref 3.87–5.11)
RDW: 16.6 % — AB (ref 11.5–15.5)
WBC: 10.1 10*3/uL (ref 4.0–10.5)

## 2017-06-17 LAB — APTT
aPTT: 35 seconds (ref 24–36)
aPTT: 57 seconds — ABNORMAL HIGH (ref 24–36)
aPTT: 59 seconds — ABNORMAL HIGH (ref 24–36)

## 2017-06-17 MED ORDER — FREE WATER
325.0000 mL | Freq: Three times a day (TID) | Status: DC
  Administered 2017-06-17 – 2017-06-19 (×5): 325 mL

## 2017-06-17 MED ORDER — DEXTROSE 50 % IV SOLN
INTRAVENOUS | Status: AC
  Administered 2017-06-17: 25 mL
  Filled 2017-06-17: qty 50

## 2017-06-17 MED ORDER — PREDNISONE 10 MG PO TABS
10.0000 mg | ORAL_TABLET | Freq: Every day | ORAL | Status: DC
  Administered 2017-06-18 – 2017-06-22 (×5): 10 mg
  Filled 2017-06-17 (×5): qty 1

## 2017-06-17 MED ORDER — IPRATROPIUM-ALBUTEROL 0.5-2.5 (3) MG/3ML IN SOLN
3.0000 mL | Freq: Three times a day (TID) | RESPIRATORY_TRACT | Status: DC
  Administered 2017-06-18 – 2017-06-26 (×24): 3 mL via RESPIRATORY_TRACT
  Filled 2017-06-17 (×4): qty 3
  Filled 2017-06-17: qty 39
  Filled 2017-06-17 (×20): qty 3

## 2017-06-17 MED ORDER — LOSARTAN POTASSIUM 50 MG PO TABS
25.0000 mg | ORAL_TABLET | Freq: Every day | ORAL | Status: DC
  Administered 2017-06-17 – 2017-06-22 (×6): 25 mg via ORAL
  Filled 2017-06-17 (×6): qty 1

## 2017-06-17 NOTE — Progress Notes (Signed)
pts husband and son visiting. Explained the change in behavior of wrist restraints. Verbalized understanding.

## 2017-06-17 NOTE — Progress Notes (Signed)
Patient ID: Kelli Velasquez, female   DOB: 16-Aug-1940, 76 y.o.   MRN: [id number]      Advanced Heart Failure Rounding Note  Subjective:    Over 2 weeks prior to admission, she had increased dyspnea with exertion, she was short of breath at rest on the day of admission.  Her husband says she has been taking 20 mg of lasix daily and occasionally an extra 20 mg of lasix.   Presented to [facility] ED with respiratory distress. Family reported increased dyspnea over the last week requiring 2 liters oxygen at home. In ED she received solumedrol and duonebs. Placed on Bipap but continue decline requiring intubation. CXR concerning for interstitial edema and bilateral effusions.  Extubated 11/4. Noted to have swelling of upper airways on speech/swallow evaluation 11/6.  Developed respiratory distress 11/6 in the evening with hypoxia and hypercapnia and was re-intubated.    Extubated 06/07/17.   CXR 06/09/17 with LLL PNA.  She has completed abx for MSSA PNA.   UE [place] 06/14/17 with non-occlusive DVT in right axillary vein. Noted around PICC line in right basilic vein, and short segment of SVT noted in the forearm within the cephalic vein. LUE negative  BLE [place] 06/14/17 negative for DVTs.  LHC (11/21) with DES to RCA (95% mid RCA stenosis).   She looks better today, alert and interactive, voice improving.  No chest pain or dyspnea.  Still NPO with tube feeds.   Cardiac Studies: Echo 05/26/2017 EF 35-40%  Grade I DD RV normal Echo 2017 EF 45-50% with WMA Myoview 2017 No significant ischemia EF 48%.  [place] 2011- DES LAD and RCA.   Objective:   Weight Range: 231 lb (104.8 kg) Body mass index is 39.65 kg/m.   Vital Signs:   Temp:  [96.9 F (36.1 C)-99.3 F (37.4 C)] 99.3 F (37.4 C) (11/22 0807) Pulse Rate:  [0-144] 78 (11/22 0605) Resp:  [0-54] 22 (11/22 0605) BP: (98-213)/(44-198) 152/62 (11/22 0456) SpO2:  [0 %-100 %] 100 % (11/22 0735) FiO2 (%):  [40 %-50 %] 40 % (11/22 0238) Weight:  [231 lb  (104.8 kg)] 231 lb (104.8 kg) (11/22 0537) Last BM Date: 06/16/17  Weight change: Filed Weights   06/15/17 0444 06/16/17 0600 06/17/17 0537  Weight: 233 lb 0.4 oz (105.7 kg) 243 lb (110.2 kg) 231 lb (104.8 kg)   Intake/Output:   Intake/Output Summary (Last 24 hours) at 06/17/2017 0816 Last data filed at 06/17/2017 0400 Gross per 24 hour  Intake 197.1 ml  Output 300 ml  Net -102.9 ml    Physical Exam   General: NAD Neck: Thick, JVP difficult, no thyromegaly or thyroid nodule.  Lungs: Decreased breath sounds at bases.  CV: Nondisplaced PMI.  Heart regular S1/S2, no S3/S4, no murmur.  1+ edema right forearm.   Abdomen: Soft, nontender, no hepatosplenomegaly, no distention.  Skin: Ecchymosis right forearm.   Neurologic: Alert, answers questions  Psych: Normal affect. Extremities: No clubbing or cyanosis.  HEENT: Normal.    Telemetry   NSR 70s with PACs, Personally reviewed.   Labs    CBC Recent Labs    06/16/17 0235 06/17/17 0322  WBC 12.2* 10.1  NEUTROABS 9.5* 8.1*  HGB 10.3* 10.6*  HCT 35.2* 35.6*  MCV 101.4* 100.8*  PLT 111* 732*   Basic Metabolic Panel Recent Labs    06/15/17 0634 06/16/17 0235 06/17/17 0322  NA 149* 145 146*  K 3.6 3.5 3.5  CL 104 100* 103  CO2 37* 38* 35*  GLUCOSE 123* 124* 96  BUN 32* 34* 22*  CREATININE 0.69 0.71 0.64  CALCIUM 8.9 8.5* 8.3*  MG 2.4  --   --    Liver Function Tests Recent Labs    06/15/17 0634 06/16/17 0235  AST 25 19  ALT 40 34  ALKPHOS 59 63  BILITOT 0.8 0.5  PROT 5.2* 4.9*  ALBUMIN 2.6* 2.5*   No results for input(s): LIPASE, AMYLASE in the last 72 hours. Cardiac Enzymes No results for input(s): CKTOTAL, CKMB, CKMBINDEX, TROPONINI in the last 72 hours. BNP: BNP (last 3 results) Recent Labs    05/26/17 0610  BNP 760.9*   ProBNP (last 3 results) Recent Labs    01/11/17 1053  PROBNP 264.0*   D-Dimer No results for input(s): DDIMER in the last 72 hours. Hemoglobin A1C No results for  input(s): HGBA1C in the last 72 hours. Fasting Lipid Panel No results for input(s): CHOL, HDL, LDLCALC, TRIG, CHOLHDL, LDLDIRECT in the last 72 hours. Thyroid Function Tests Recent Labs    06/15/17 0634  TSH 1.452    Other results:  Imaging   No results found.  Medications:     Scheduled Medications: . bisoprolol  5 mg Per Tube Daily  . chlorhexidine  15 mL Mouth Rinse BID  . cyanocobalamin  1,000 mcg Subcutaneous Daily  . ezetimibe  10 mg Per Tube QHS  . feeding supplement (PRO-STAT SUGAR FREE 64)  30 mL Per Tube TID  . folic acid  1 mg Per Tube Daily  . free water  325 mL Per Tube Q8H  . furosemide  40 mg Oral Daily  . hydrALAZINE  50 mg Per Tube Q8H  . insulin aspart  0-20 Units Subcutaneous Q4H  . insulin glargine  26 Units Subcutaneous Daily  . ipratropium-albuterol  3 mL Nebulization Q6H  . isosorbide dinitrate  20 mg Per Tube TID  . losartan  25 mg Oral Daily  . mouth rinse  15 mL Mouth Rinse q12n4p  . montelukast  10 mg Per Tube QHS  . pantoprazole sodium  40 mg Per Tube Daily  . predniSONE  15 mg Per Tube Q breakfast  . sodium chloride flush  10-40 mL Intracatheter Q12H  . sodium chloride flush  3 mL Intravenous Q12H  . spironolactone  25 mg Per Tube Daily  . ticagrelor  90 mg Oral BID    Infusions: . sodium chloride 250 mL (06/11/17 1400)  . sodium chloride    . bivalirudin (ANGIOMAX) infusion 0.5 mg/mL (Non-ACS indications) 0.12 mg/kg/hr (06/17/17 0749)  . 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  Admitted with acute/chronic respiratory failure  Assessment/Plan   1. 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  Echo this admission with EF down to 35-40% range, around 50% in past.  She was admitted with worsening dyspnea, elevated BNP, CXR with pulmonary edema => LHC 11/21 showed tight RCA stenosis, so may have been ischemia-mediated flash pulmonary edema.  She has diuresed while here, now on po Lasix. Volume status seems ok at this point, weight trending down.  - Continue lasix 40 mg daily.  - Continue hydral 50 mg TID/Isordil 20 tid.  Will titrate these down and up on losartan as renal function tolerates, can probably eventually transition to [facility].  Will add losartan 25 mg daily today.  - Continue spironolactone 25 mg daily.  - Continue  bisoprolol 5 mg daily (no wheezing so far, more beta-1 specific beta blocker).  2. Acute hypoxemic respiratory failure: Initially thought multifactorial due to pulmonary edema, effusions, BOOP and OHS.  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  She was diuresed.  At baseline, she is on 2L home oxygen.  Extubated 11/4 but developed respiratory distress 11/6 and re-intubated.  She had had swelling of the upper airways noted by speech/swallow exam.  Found to have MSSA PNA.  She has now completed antibiotic course.   - Improved. Back down to 3L O2 by [place], using Bipap at night.  3. CAD: h/o DES to LAD and RCA in 2011.  No chest pain but admitted with severe dyspnea/volume overload and fall in EF on echo.  Troponin mildly elevated but no trend. Cath 11/21 with severe mid RCA stenosis, treated with DES.  Suspect this may have triggered ischemic flash pulmonary edema.  - She got Brilinta with cath, will transition tomorrow to Plavix with 300 mg po load (as she will need ongoing anticoagulation for RUE DVT).  Will stop ASA now given anticoagulation need.  - She has not tolerated Zocor or Crestor.  Will try her on low dose pravastatin 40 mg daily.  4.  AKI: Resolved. 5. HTN: SBP 130s-150s.  As above, adding losartan.  6. Code status: DNR/DNI at this  time. No change.  7. NSVT: No recurrence. No change. 8. DVT: RUE, triggered by PICC. Started on bivalirudin 06/14/17 on hematology recommendation (platelets had fallen but HIT negative by SRA).   - PICC has been removed.  - Given triggered RUE DVT, would favor 3 months anticoagulation with Xarelto or Eliquis => will need to transition to [place] as currently still on bivalirudin.   9. Mouth discoloration: Her tongue is green. ? From swallow study, or if thrush. Per primary.  10. Mobilize, PT.   Length of Stay: [place], [place]  06/17/2017 8:16 AM  Advanced Heart Failure Team Pager [phone] (M-F; 7a - 4p)  Please contact [facility] for night-coverage after hours (4p -7a ) and weekends on [email or web address]

## 2017-06-17 NOTE — Progress Notes (Signed)
PULMONARY / CRITICAL CARE MEDICINE   Name: Kelli Velasquez MRN: [id number] DOB: [birth year]    ADMISSION DATE:  05/26/2017  REFERRING MD:  Dr. Wyvonnia Dusky  CHIEF COMPLAINT:  SOB  HISTORY OF PRESENT ILLNESS:   76 yo female former smoker presented with progressive dyspnea, cough, wheeze, and edema.  Required intubation for respiratory failure from CHF, A fib and MSSA PNA. PMHx of CAD, HTN, combined CHF, and ? 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 Melvyn Novas).  SUBJECTIVE:  Comfortable on Nasal 02 p bipap overnight /  3lpm at present with sats near 100%  VITAL SIGNS: BP (!) 152/62 (BP Location: Right Leg)   Pulse 78   Temp 99.3 F (37.4 C) (Oral)   Resp (!) 22   Ht 5' 4" (1.626 m)   Wt 231 lb (104.8 kg)   LMP 07/27/1980   SpO2 100%   BMI 39.65 kg/m   INTAKE / OUTPUT:    Intake/Output Summary (Last 24 hours) at 06/17/2017 1019 Last data filed at 06/17/2017 0400 Gross per 24 hour  Intake 197.1 ml  Output 300 ml  Net -102.9 ml     PHYSICAL EXAMINATION:  General - pleasant/ joking but can't understand due to hoarseness Pt alert,   No jvd Oropharynx clear Neck supple Lungs- no wob/ with a very min   exp > insp rhonchi bilaterally RRR no s3 or or sign murmur Abd obese with limited insp  excursion  Extr wam with no edema or clubbing noted Neuro  No motor deficits apparent   LABS:  BMET Recent Labs  Lab 06/15/17 0634 06/16/17 0235 06/17/17 0322  NA 149* 145 146*  K 3.6 3.5 3.5  CL 104 100* 103  CO2 37* 38* 35*  BUN 32* 34* 22*  CREATININE 0.69 0.71 0.64  GLUCOSE 123* 124* 96    Electrolytes Recent Labs  Lab 06/12/17 0327  06/15/17 0634 06/16/17 0235 06/17/17 0322  CALCIUM 8.9   < > 8.9 8.5* 8.3*  MG 2.5*  --  2.4  --   --    < > = values in this interval not displayed.    CBC Recent Labs  Lab 06/15/17 0634 06/16/17 0235 06/17/17 0322  WBC 15.1* 12.2* 10.1  HGB 10.8* 10.3* 10.6*  HCT 37.5 35.2* 35.6*  PLT 112* 111* 112*    Liver  Enzymes Recent Labs  Lab 06/13/17 1520 06/15/17 0634 06/16/17 0235  AST _0 ALT 33 40 34  ALKPHOS 58 59 63  BILITOT 0.9 0.8 0.5  ALBUMIN 2.8* 2.6* 2.5*    Glucose Recent Labs  Lab 06/16/17 1617 06/16/17 2019 06/16/17 2327 06/17/17 0433 06/17/17 0502 06/17/17 0806  GLUCAP 164* 112* 80 69 78 97    Imaging No results found.  STUDIES:  Echo 10/31>>> EF 35-40%, diffuse hypokinesis CT chest 11/7 read pending > RLL consolidation, no appreciable effusion    CULTURES: BAL AFB 11/07 >> neg smear >>> BAL PCP 11/07 >> negative BAL fungal 11/07 >>  BAL 11/07 >> oral flora Sputum 11/07 >> MSSA Blood 11/07 >> negative  ANTIBIOTICS: Tressie Ellis 11/05 >> 11/10 Vancomycin 11/05 >> 11/07 Rocephin 11/10 >> 11/16  SIGNIFICANT EVENTS: 10/31 Admit 11/04 extubate 11/05 transfer SDU 11/07 transfer ICU reintubated. 11/12 extubated  11/15 transfer to SDU  LINES/TUBES: ETT 10/31>>>11/4 ETT 11/07 >> 11/12 Rt PICC 11/07 >>   DISCUSSION: 76 yo female with acute hypoxic respiratory failure from pulmonary edema, MSSA PNA, presumed sleep disordered breathing, and hx of BOOP (  proven by VATS bx 07/11/13) on chronic prednisone.  ASSESSMENT / PLAN:  Acute hypoxic respiratory failure/ multifactoria  - oxygen to keep SpO2 90 to 95% - Continue nebs as scheduled - Mobilize as able - Pulmonary Toilet as able  Presumed sleep disordered breathing/ hypercarbia related to OHS - Bipap qhs and prn during the day    MSSA PNA. T max 99.1 - Completed ABX 11/16 No evidence of recurrent infrection    Hx of BOOP. Lab Results  Component Value Date   ESRSEDRATE 45 (H) 01/11/2017   ESRSEDRATE 21 11/20/2016   ESRSEDRATE 17 05/07/2016  Rec check ESR/ albeit non-specific, taper pred back toward prev floor of 5 mg daily  -   reduce to 10 mg daily 11/22   Acute on chronic combined CHF with likely ischemia resulting in flash pulmonary edema A fib with RVR. CAD, HLD. - per cardiology -  defer addition of anticoagulation to cardiology and primary team   DVT prophylaxis - SCDs SUP - protonix Nutrition - tube feeds Goals of care - DNR/DNI  PCCM will see twice weekly for pulmonary issues. Rest per primary team.   Can probably change bipap to prn at this point    Christinia Gully, MD Pulmonary and [place] [phone] After 5:30 PM or weekends, use [facility] [phone]

## 2017-06-17 NOTE — Progress Notes (Addendum)
ANTICOAGULATION CONSULT NOTE - Follow Up Consult  Pharmacy Consult for Bivalirudin Indication: DVT  Allergies  Allergen Reactions  . Fish Allergy Anaphylaxis  . Shellfish Allergy Anaphylaxis  . Aspirin     REACTION: nausea and vomiting High doses  . Atorvastatin     REACTION: leg pain  . Crestor [Rosuvastatin Calcium] Other (See Comments)    Muscle pain - allergy/intolerance  . Penicillins     Due to mold allergy per pt  . Simvastatin     REACTION: muscle pain  . Trandolapril     REACTION: leg pain   Patient Measurements: Height: 5\' 4"  (162.6 cm) Weight: 231 lb (104.8 kg) IBW/kg (Calculated) : 54.7  Vital Signs: Temp: 99.2 F (37.3 C) (11/22 0456) Temp Source: Axillary (11/22 0456) BP: 152/62 (11/22 0456) Pulse Rate: 78 (11/22 0605)  Labs: Recent Labs    06/15/17 0634 06/15/17 1018 06/16/17 0235 06/17/17 0322 06/17/17 0428  HGB 10.8*  --  10.3* 10.6*  --   HCT 37.5  --  35.2* 35.6*  --   PLT 112*  --  111* 112*  --   APTT  --  54* 55*  --  35  LABPROT  --   --  17.5*  --   --   INR  --   --  1.45  --   --   CREATININE 0.69  --  0.71 0.64  --    Estimated Creatinine Clearance: 69.4 mL/min (by C-G formula based on SCr of 0.64 mg/dL).  Medications: Bivalirudin @ 0.1mg /kg/hr  Assessment: 76yoF admitted with respiratory distress, s/p VDRF now improved.  She had drop in PLTC while on heparin sq for VTE px and PLTCs have remained low despite off heparin.  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  On 11/19 she was found to have new VTE at PICC line tip. Bivalirudin started per hematology recommendations.  APTT is subtherapeutic at 35 seconds, no issues with infusion per RN and it was started at 2230 appropriately overnight. CBC stable, no S/Sx bleeding noted.  Goal of Therapy:  aPTT 50-85 sec seconds Monitor platelets by anticoagulation protocol: Yes   Plan:  -Increase bivalirudin to 0.12mg /kg/hr -Next aPTT in 2 hr  ADDENDUM: Repeat aPTT therapeutic at 57s, repeat  59s  Plan: -Continue bivalirudin 0.12 mg/kg/hr -Daily aPTT  Arrie Senate, PharmD PGY-2 Cardiology Pharmacy Resident Pager: [phone] 06/17/2017

## 2017-06-17 NOTE — Progress Notes (Signed)
Pt. Pulled out IVs. Trying to communicated but incomprehensible speech and confused. Restraints applied.

## 2017-06-17 NOTE — Progress Notes (Signed)
Pt. Mentality has decreased throughout the day. Now combative with staff. Alert x1. Mitttens placed for safety as pt. Is confused and pulling at equipment.

## 2017-06-17 NOTE — Progress Notes (Signed)
Pt unable to perform chest PT at this time due to weakness and some confusion.  RT instructed pt to cough pt had a weak non productive cough.  RT will continue to monitor.

## 2017-06-17 NOTE — Progress Notes (Signed)
PROGRESS NOTE    Kelli Velasquez  YHC:[id number] DOB: 1940-07-30 DOA: 05/26/2017 PCP: Abner Greenspan, MD      Brief Narrative:  76 yo F with CHF, A. fib, CAD, HTN, BOOP admitted on 10/31 with SOB, was found to have MSSA pneumonia with acute respiratory failure --> intubation and extubated on 05/30/2017.  Later patient had another episode of respiratory distress requiring repeat intubation on 06/02/2017.  Currently extubated on 06/07/2017 remains on stepdown unit.  After repeat extubation patient remains aphonic. Bronchoscopy performed on 06/02/2017, cultures so far are negative.  Sputum culture is growing MSSA patient completed antibiotic course on 06/11/2017. Also found to have acute systolic CHF, cardiology consulted and currently on IV Lasix recommend inpatient cardiac cath.  Currently further plan is continue monitoring for improvement in voice quality as well as further cardiac workup.  Cath 11/21 showed single vessel disease, stented.          Assessment & Plan:  Principal Problem:   Acute respiratory failure with hypercapnia (HCC) Active Problems:   Hyperlipidemia   Severe obesity (BMI >= 40) (HCC)   Essential hypertension   CAD, NATIVE VESSEL   OVERACTIVE BLADDER   Pulmonary nodules c/w Nodular BOOP   Acute on chronic systolic congestive heart failure (HCC)   COPD exacerbation (HCC)   Unstable angina ([place])   1. Acute hypoxic respiratory failure From MSSA pneumonia, now resolved, as well as BOOP, acute on chronic combined systolic and diastolic CHF and unstable angina. -BiPAP nightly -Continue diuresis per cardiology  2. MSSA pneumonia: Completed antibiotics  3.  Acute on chronic combined systolic and diastolic CHF: Is in the setting of A. fib with RVR, and CAD.  Echo showed EF 35%. Cath today showed single vessel disease, stented.   -Consult cardiology, appreciate recommendations -Continue Lasix per cardiology -Continue hydralazine, Isordil, Spironolactone, and BB  per Cardiology  4. History of BOOP -Continue prednisone  5. Dysphagia and aphonia -Patient is NPO -SLP consult appreciated, may need to consider PEG if not progressing over weekend  6. Diabetes -Continue SSI  7. Severe protein calorie malnutrition -Continue tube feeds per NG  8. DVT: UE DVT in setting of PICC.  Thrombocytopenia with negative HIT panel, avoiding heparins. -Continue bivalirudin, plan for transition to NOAC per Cardiology  9. Hypernatremia Mild.   -Increase free water  10. Macrocytic anemia Mild     DVT prophylaxis: Bivalirudin Code Status: DNR Family Communication: None present Disposition Plan: Additional diuresis per Cardiology. Likely home in several days.    Consultants:   Cardiology  CCM  Procedures:   Echocardiogram  Bronchoscopy  Intubation  Antimicrobials:   Ceftazidime 11/6 >> 11/10  Ceftriaxone 11/10 >> 11/16    Subjective: Arouses to my voice, but confused, aphonic.  Moves all extremities, falls back asleep.  Objective: Vitals:   06/17/17 0427 06/17/17 0456 06/17/17 0537 06/17/17 0605  BP:  (!) 152/62    Pulse: 88 77  78  Resp: (!) 21 (!) 25  (!) 22  Temp:  99.2 F (37.3 C)    TempSrc:  Axillary    SpO2: 100% 100%  99%  Weight:   104.8 kg (231 lb)   Height:        Intake/Output Summary (Last 24 hours) at 06/17/2017 0729 Last data filed at 06/17/2017 0400 Gross per 24 hour  Intake 197.1 ml  Output 300 ml  Net -102.9 ml   Filed Weights   06/15/17 0444 06/16/17 0600 06/17/17 0537  Weight: 105.7 kg (233  lb 0.4 oz) 110.2 kg (243 lb) 104.8 kg (231 lb)    Examination: General appearance: Elderly obese adult female, arouses, but confused, mittens on hands. Falls back asleep.  Skin: Warm and dry.  No suspicious rashes or lesions. Cardiac: RRR, nl S1-S2, no murmurs appreciated.  Capillary refill is brisk.  JVP not visible.  No LE edema.   Respiratory: Normal respiratory rate and rhythm.  CTAB without rales or  wheezes. Abdomen: Abdomen soft.  No TTP. No ascites, distension, hepatosplenomegaly.   Neuro: Awake, but confused, not able to speak.   Psych: Unable to assess.   Data Reviewed: I have personally reviewed following labs and imaging studies:  CBC: Recent Labs  Lab 06/13/17 1520 06/14/17 0500 06/15/17 0634 06/16/17 0235 06/17/17 0322  WBC 16.7* 15.6* 15.1* 12.2* 10.1  NEUTROABS 15.5* 12.7* 11.6* 9.5* 8.1*  HGB 11.2* 10.3* 10.8* 10.3* 10.6*  HCT 39.1 37.0 37.5 35.2* 35.6*  MCV 104.3* 103.1* 101.6* 101.4* 100.8*  PLT 83* 85* 112* 111* 132*   Basic Metabolic Panel: Recent Labs  Lab 06/12/17 0327  06/13/17 1520 06/14/17 0500 06/15/17 0634 06/16/17 0235 06/17/17 0322  NA 153*   < > 151* 147* 149* 145 146*  K 3.6   < > 4.1 3.5 3.6 3.5 3.5  CL 108   < > 104 101 104 100* 103  CO2 38*   < > 39* 39* 37* 38* 35*  GLUCOSE 85   < > 203* 154* 123* 124* 96  BUN 43*   < > 37* 35* 32* 34* 22*  CREATININE 0.79   < > 0.86 0.74 0.69 0.71 0.64  CALCIUM 8.9   < > 9.2 8.8* 8.9 8.5* 8.3*  MG 2.5*  --   --   --  2.4  --   --    < > = values in this interval not displayed.   GFR: Estimated Creatinine Clearance: 69.4 mL/min (by C-G formula based on SCr of 0.64 mg/dL). Liver Function Tests: Recent Labs  Lab 06/13/17 1520 06/15/17 0634 06/16/17 0235  AST 25 25 19   ALT 33 40 34  ALKPHOS 58 59 63  BILITOT 0.9 0.8 0.5  PROT 5.5* 5.2* 4.9*  ALBUMIN 2.8* 2.6* 2.5*   No results for input(s): LIPASE, AMYLASE in the last 168 hours. No results for input(s): AMMONIA in the last 168 hours. Coagulation Profile: Recent Labs  Lab 06/16/17 0235  INR 1.45   Cardiac Enzymes: Recent Labs  Lab 06/12/17 0327 06/12/17 0748 06/12/17 1702  TROPONINI 0.07* 0.07* 0.07*   BNP (last 3 results) Recent Labs    01/11/17 1053  PROBNP 264.0*   HbA1C: No results for input(s): HGBA1C in the last 72 hours. CBG: Recent Labs  Lab 06/16/17 1617 06/16/17 2019 06/16/17 2327 06/17/17 0433  06/17/17 0502  GLUCAP 164* 112* 80 69 78   Lipid Profile: No results for input(s): CHOL, HDL, LDLCALC, TRIG, CHOLHDL, LDLDIRECT in the last 72 hours. Thyroid Function Tests: Recent Labs    06/15/17 0634  TSH 1.452   Anemia Panel: Recent Labs    06/15/17 0634  VITAMINB12 945*   Urine analysis:    Component Value Date/Time   COLORURINE YELLOW 07/30/2009 1435   APPEARANCEUR CLEAR 07/30/2009 1435   LABSPEC 1.015 07/30/2009 1435   PHURINE 6.0 07/30/2009 1435   GLUCOSEU NEGATIVE 07/30/2009 1435   HGBUR NEGATIVE 07/30/2009 1435   BILIRUBINUR neg. 06/28/2013 [place] 07/30/2009 1435   PROTEINUR neg. 06/28/2013 1041   PROTEINUR NEGATIVE  07/30/2009 1435   UROBILINOGEN 0.2 06/28/2013 1041   UROBILINOGEN 0.2 07/30/2009 1435   NITRITE neg. 06/28/2013 1041   NITRITE NEGATIVE 07/30/2009 1435   LEUKOCYTESUR Negative 06/28/2013 1041   Sepsis Labs: @LABRCNTIP (procalcitonin:4,lacticidven:4)  )No results found for this or any previous visit (from the past 240 hour(s)).       Radiology Studies: No results found.      Scheduled Meds: . aspirin  81 mg Oral Daily  . bisoprolol  5 mg Per Tube Daily  . chlorhexidine  15 mL Mouth Rinse BID  . cyanocobalamin  1,000 mcg Subcutaneous Daily  . ezetimibe  10 mg Per Tube QHS  . feeding supplement (PRO-STAT SUGAR FREE 64)  30 mL Per Tube TID  . folic acid  1 mg Per Tube Daily  . free water  300 mL Per Tube Q8H  . furosemide  40 mg Oral Daily  . hydrALAZINE  50 mg Per Tube Q8H  . insulin aspart  0-20 Units Subcutaneous Q4H  . insulin glargine  26 Units Subcutaneous Daily  . ipratropium-albuterol  3 mL Nebulization Q6H  . isosorbide dinitrate  20 mg Per Tube TID  . mouth rinse  15 mL Mouth Rinse q12n4p  . montelukast  10 mg Per Tube QHS  . pantoprazole sodium  40 mg Per Tube Daily  . predniSONE  15 mg Per Tube Q breakfast  . sodium chloride flush  10-40 mL Intracatheter Q12H  . sodium chloride flush  3 mL  Intravenous Q12H  . spironolactone  25 mg Per Tube Daily  . ticagrelor  90 mg Oral BID   Continuous Infusions: . sodium chloride 250 mL (06/11/17 1400)  . sodium chloride    . bivalirudin (ANGIOMAX) infusion 0.5 mg/mL (Non-ACS indications) 0.1 mg/kg/hr (06/16/17 2227)  . feeding supplement (JEVITY 1.2 CAL) 1,000 mL (06/14/17 1712)     LOS: 22 days    Time spent: 20 minutes    Edwin Dada, MD [facility] Pager [phone]  If 7PM-7AM, please contact night-coverage [email or web address] Password [facility] 06/17/2017, 7:29 AM

## 2017-06-17 NOTE — Progress Notes (Signed)
Upon doing assessment Pt. Had restricted extremity armband on left arm. RUE has DVT. Believe arm band was placed on wrong arm. No hx of mastectomy or reasoning why restricted Left Armband on.

## 2017-06-17 NOTE — Progress Notes (Signed)
Hypoglycemic Event  CBG: 69   Treatment: D50 IV 25 mL  Symptoms: None  Follow-up CBG: Time:0500 CBG Result: 78  Possible Reasons for Event: Inadequate meal intake  Will continue to monitor. Pt on BIPAP.     Charmayne Sheer

## 2017-06-18 ENCOUNTER — Encounter (HOSPITAL_COMMUNITY): Payer: Self-pay | Admitting: Cardiology

## 2017-06-18 ENCOUNTER — Inpatient Hospital Stay (HOSPITAL_COMMUNITY): Payer: Medicare Other

## 2017-06-18 DIAGNOSIS — I2510 Atherosclerotic heart disease of native coronary artery without angina pectoris: Secondary | ICD-10-CM

## 2017-06-18 LAB — CBC WITH DIFFERENTIAL/PLATELET
BASOS ABS: 0 10*3/uL (ref 0.0–0.1)
Basophils Relative: 0 %
EOS ABS: 0.4 10*3/uL (ref 0.0–0.7)
EOS PCT: 3 %
HCT: 37.3 % (ref 36.0–46.0)
Hemoglobin: 11.1 g/dL — ABNORMAL LOW (ref 12.0–15.0)
LYMPHS PCT: 10 %
Lymphs Abs: 1.2 10*3/uL (ref 0.7–4.0)
MCH: 29.4 pg (ref 26.0–34.0)
MCHC: 29.8 g/dL — ABNORMAL LOW (ref 30.0–36.0)
MCV: 98.7 fL (ref 78.0–100.0)
MONO ABS: 0.8 10*3/uL (ref 0.1–1.0)
Monocytes Relative: 6 %
Neutro Abs: 10.1 10*3/uL — ABNORMAL HIGH (ref 1.7–7.7)
Neutrophils Relative %: 81 %
PLATELETS: 124 10*3/uL — AB (ref 150–400)
RBC: 3.78 MIL/uL — ABNORMAL LOW (ref 3.87–5.11)
RDW: 16.6 % — AB (ref 11.5–15.5)
WBC: 12.5 10*3/uL — AB (ref 4.0–10.5)

## 2017-06-18 LAB — BASIC METABOLIC PANEL
ANION GAP: 7 (ref 5–15)
BUN: 22 mg/dL — ABNORMAL HIGH (ref 6–20)
CALCIUM: 8.7 mg/dL — AB (ref 8.9–10.3)
CO2: 35 mmol/L — AB (ref 22–32)
Chloride: 106 mmol/L (ref 101–111)
Creatinine, Ser: 0.7 mg/dL (ref 0.44–1.00)
Glucose, Bld: 133 mg/dL — ABNORMAL HIGH (ref 65–99)
POTASSIUM: 3.3 mmol/L — AB (ref 3.5–5.1)
Sodium: 148 mmol/L — ABNORMAL HIGH (ref 135–145)

## 2017-06-18 LAB — SEDIMENTATION RATE: Sed Rate: 40 mm/hr — ABNORMAL HIGH (ref 0–22)

## 2017-06-18 LAB — BLOOD GAS, ARTERIAL
Acid-Base Excess: 13.3 mmol/L — ABNORMAL HIGH (ref 0.0–2.0)
BICARBONATE: 38.8 mmol/L — AB (ref 20.0–28.0)
Drawn by: 51806
EXPIRATORY PAP: 6
FIO2: 40
Inspiratory PAP: 12
Mode: POSITIVE
O2 Saturation: 97.3 %
PATIENT TEMPERATURE: 98.6
PO2 ART: 106 mmHg (ref 83.0–108.0)
RATE: 10 resp/min
pCO2 arterial: 64.7 mmHg — ABNORMAL HIGH (ref 32.0–48.0)
pH, Arterial: 7.395 (ref 7.350–7.450)

## 2017-06-18 LAB — GLUCOSE, CAPILLARY
GLUCOSE-CAPILLARY: 115 mg/dL — AB (ref 65–99)
GLUCOSE-CAPILLARY: 141 mg/dL — AB (ref 65–99)
Glucose-Capillary: 173 mg/dL — ABNORMAL HIGH (ref 65–99)
Glucose-Capillary: 174 mg/dL — ABNORMAL HIGH (ref 65–99)
Glucose-Capillary: 136 mg/dL — ABNORMAL HIGH (ref 65–99)
Glucose-Capillary: 146 mg/dL — ABNORMAL HIGH (ref 65–99)
Glucose-Capillary: 157 mg/dL — ABNORMAL HIGH (ref 65–99)
Glucose-Capillary: 81 mg/dL (ref 65–99)

## 2017-06-18 LAB — URINALYSIS, ROUTINE W REFLEX MICROSCOPIC
BILIRUBIN URINE: NEGATIVE
GLUCOSE, UA: NEGATIVE mg/dL
HGB URINE DIPSTICK: NEGATIVE
KETONES UR: NEGATIVE mg/dL
Leukocytes, UA: NEGATIVE
Nitrite: NEGATIVE
PH: 7 (ref 5.0–8.0)
Protein, ur: NEGATIVE mg/dL
Specific Gravity, Urine: 1.017 (ref 1.005–1.030)

## 2017-06-18 LAB — APTT: APTT: 29 s (ref 24–36)

## 2017-06-18 MED ORDER — CLOPIDOGREL BISULFATE 75 MG PO TABS
75.0000 mg | ORAL_TABLET | Freq: Every day | ORAL | Status: DC
  Administered 2017-06-19 – 2017-06-26 (×8): 75 mg via ORAL
  Filled 2017-06-18 (×8): qty 1

## 2017-06-18 MED ORDER — PRAVASTATIN SODIUM 40 MG PO TABS
40.0000 mg | ORAL_TABLET | Freq: Every day | ORAL | Status: DC
  Administered 2017-06-18 – 2017-06-25 (×8): 40 mg via ORAL
  Filled 2017-06-18 (×8): qty 1

## 2017-06-18 MED ORDER — APIXABAN 5 MG PO TABS
5.0000 mg | ORAL_TABLET | Freq: Two times a day (BID) | ORAL | Status: DC
  Administered 2017-06-25 – 2017-06-26 (×3): 5 mg via ORAL
  Filled 2017-06-18 (×3): qty 1

## 2017-06-18 MED ORDER — BISOPROLOL FUMARATE 5 MG PO TABS
2.5000 mg | ORAL_TABLET | Freq: Every day | ORAL | Status: DC
  Administered 2017-06-18: 2.5 mg
  Filled 2017-06-18: qty 1

## 2017-06-18 MED ORDER — POTASSIUM CHLORIDE CRYS ER 20 MEQ PO TBCR
40.0000 meq | EXTENDED_RELEASE_TABLET | Freq: Once | ORAL | Status: DC
  Filled 2017-06-18: qty 2

## 2017-06-18 MED ORDER — DEXTROSE 5 % IV SOLN
INTRAVENOUS | Status: DC
  Administered 2017-06-18 – 2017-06-19 (×2): via INTRAVENOUS

## 2017-06-18 MED ORDER — CLOPIDOGREL BISULFATE 300 MG PO TABS
300.0000 mg | ORAL_TABLET | Freq: Once | ORAL | Status: AC
  Administered 2017-06-18: 300 mg via ORAL
  Filled 2017-06-18: qty 1

## 2017-06-18 MED ORDER — APIXABAN 5 MG PO TABS
10.0000 mg | ORAL_TABLET | Freq: Two times a day (BID) | ORAL | Status: AC
  Administered 2017-06-18 – 2017-06-24 (×14): 10 mg via ORAL
  Filled 2017-06-18 (×14): qty 2

## 2017-06-18 NOTE — Progress Notes (Signed)
PT Cancellation Note  Patient Details Name: Kelli Velasquez MRN: [id number] DOB: 1940-11-06   Cancelled Treatment:    Reason Eval/Treat Not Completed: Medical issues which prohibited therapy. Pt on bipap.   Shary Decamp [facility] 06/18/2017, 10:37 AM Suanne Marker PT [phone]

## 2017-06-18 NOTE — Progress Notes (Addendum)
Nutrition Follow-up  DOCUMENTATION CODES:   Morbid obesity  INTERVENTION:    Continue Jevity 1.2at 5m/hv via cortrak with 316mPro-statTID  Pt receiving 325 mL free water every 8 hrs  Total regimen providing:1884kcal, 118gm protein, 20402mree water daily  NUTRITION DIAGNOSIS:   Inadequate oral intake related to inability to eat as evidenced by NPO status.  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  11/20- failed FEES 11/22- hypoglycemic event  Per MD notes, pt would not like PEG.   Per RN, TF currently on hold due to Bi-pap. Pt was receiving Jevity 1.2 @ 55 ml/hr via cortrak tube with 30 ml Prostat TID. Also receiving 325 ml free water flush every 8 hours. Complete TF regimen provides 184 kcals, 118 grams protein, and 1065 ml water (2040 ml free water with flush regimen), daily, which meets 100% of estimated kcal needs.  Labs reviewed: Na: 148, K: 3.3,  Diet Order:  Diet NPO time specified  EDUCATION NEEDS:   No education needs have been identified at this time  Skin:  Skin Assessment: Reviewed RN Assessment  Last BM:  06/17/17  Height:   Ht Readings from Last 1 Encounters:  05/31/17 _0  (1.626 m)    Weight:   Wt Readings from Last 1 Encounters:  06/18/17 238 lb (108 kg)    Ideal Body Weight:  54.5 kg  BMI:  Body mass index is 40.85 kg/m.  Estimated Nutritional Needs:   Kcal:  1700-1900  Protein:  110-130 gm  Fluid:  1.7-1.9 L    Maycie Luera A. WilJimmye NormanD, LDN, CDE Pager: [phone]  hours Pager: [phone]

## 2017-06-18 NOTE — Progress Notes (Signed)
Patient removed bipap mask and refused BIPAP when RN attempted to reapply mask. Patient is resting comfortably on 3L [place]. RT will monitor.

## 2017-06-18 NOTE — Progress Notes (Addendum)
Patient ID: Kelli Velasquez, female   DOB: 03/10/1941, 76 y.o.   MRN: [id number]      Advanced Heart Failure Rounding Note  Subjective:    Over 2 weeks prior to admission, she had increased dyspnea with exertion, she was short of breath at rest on the day of admission.  Her husband says she has been taking 20 mg of lasix daily and occasionally an extra 20 mg of lasix.   Presented to [facility] ED with respiratory distress. Family reported increased dyspnea over the last week requiring 2 liters oxygen at home. In ED she received solumedrol and duonebs. Placed on Bipap but continue decline requiring intubation. CXR concerning for interstitial edema and bilateral effusions.  Extubated 11/4. Noted to have swelling of upper airways on speech/swallow evaluation 11/6.  Developed respiratory distress 11/6 in the evening with hypoxia and hypercapnia and was re-intubated.    Extubated 06/07/17.   CXR 06/09/17 with LLL PNA.  She has completed abx for MSSA PNA.   UE [place] 06/14/17 with non-occlusive DVT in right axillary vein. Noted around PICC line in right basilic vein, and short segment of SVT noted in the forearm within the cephalic vein. LUE negative  BLE [place] 06/14/17 negative for DVTs.  LHC (11/21) with DES to RCA (95% mid RCA stenosis).   Confused this morning, appears that this started yesterday afternoon.  Denies chest pain/dyspnea.  Afebrile.   Cardiac Studies: Echo 05/26/2017 EF 35-40%  Grade I DD RV normal Echo 2017 EF 45-50% with WMA Myoview 2017 No significant ischemia EF 48%.  [place] 2011- DES LAD and RCA.   Objective:   Weight Range: 238 lb (108 kg) Body mass index is 40.85 kg/m.   Vital Signs:   Temp:  [97.5 F (36.4 C)-99.1 F (37.3 C)] 97.5 F (36.4 C) (11/23 0503) Pulse Rate:  [67-80] 67 (11/23 0503) Resp:  [26-31] 26 (11/23 0503) BP: (105-171)/(45-84) 171/84 (11/23 0503) SpO2:  [95 %-99 %] 98 % (11/23 0737) Weight:  [238 lb (108 kg)-239 lb (108.4 kg)] 238 lb (108 kg) (11/23  0640) Last BM Date: 06/17/17  Weight change: Filed Weights   06/17/17 0537 06/18/17 0503 06/18/17 0640  Weight: 231 lb (104.8 kg) 239 lb (108.4 kg) 238 lb (108 kg)   Intake/Output:   Intake/Output Summary (Last 24 hours) at 06/18/2017 0827 Last data filed at 06/18/2017 0500 Gross per 24 hour  Intake 1855.4 ml  Output -  Net 1855.4 ml    Physical Exam   General: NAD Neck: Thick, JVP difficult, no thyromegaly or thyroid nodule.  Lungs: Clear to auscultation bilaterally with normal respiratory effort. CV: Nondisplaced PMI.  Heart regular S1/S2, no S3/S4, no murmur.  No peripheral edema.   Abdomen: Soft, nontender, no hepatosplenomegaly, no distention.  Skin: Intact without lesions or rashes.  Neurologic: Alert and oriented x 3.  Psych: Normal affect. Extremities: No clubbing or cyanosis.  HEENT: Normal.    Telemetry   NSR 70s with PACs and occasional drops in HR into the 40s (sinus brady), Personally reviewed.   Labs    CBC Recent Labs    06/17/17 0322 06/18/17 0529  WBC 10.1 12.5*  NEUTROABS 8.1* 10.1*  HGB 10.6* 11.1*  HCT 35.6* 37.3  MCV 100.8* 98.7  PLT 112* 086*   Basic Metabolic Panel Recent Labs    06/16/17 0235 06/17/17 0322  NA 145 146*  K 3.5 3.5  CL 100* 103  CO2 38* 35*  GLUCOSE 124* 96  BUN 34* 22*  CREATININE 0.71 0.64  CALCIUM 8.5* 8.3*   Liver Function Tests Recent Labs    06/16/17 0235  AST 19  ALT 34  ALKPHOS 63  BILITOT 0.5  PROT 4.9*  ALBUMIN 2.5*   No results for input(s): LIPASE, AMYLASE in the last 72 hours. Cardiac Enzymes No results for input(s): CKTOTAL, CKMB, CKMBINDEX, TROPONINI in the last 72 hours. BNP: BNP (last 3 results) Recent Labs    05/26/17 0610  BNP 760.9*   ProBNP (last 3 results) Recent Labs    01/11/17 1053  PROBNP 264.0*   D-Dimer No results for input(s): DDIMER in the last 72 hours. Hemoglobin A1C No results for input(s): HGBA1C in the last 72 hours. Fasting Lipid Panel No results  for input(s): CHOL, HDL, LDLCALC, TRIG, CHOLHDL, LDLDIRECT in the last 72 hours. Thyroid Function Tests No results for input(s): TSH, T4TOTAL, T3FREE, THYROIDAB in the last 72 hours.  Invalid input(s): FREET3  Other results:  Imaging   No results found.  Medications:     Scheduled Medications: . apixaban  10 mg Oral BID   Followed by  . [START ON 06/25/2017] apixaban  5 mg Oral BID  . bisoprolol  2.5 mg Per Tube Daily  . chlorhexidine  15 mL Mouth Rinse BID  . clopidogrel  300 mg Oral Once  . [START ON 06/19/2017] clopidogrel  75 mg Oral Daily  . cyanocobalamin  1,000 mcg Subcutaneous Daily  . ezetimibe  10 mg Per Tube QHS  . feeding supplement (PRO-STAT SUGAR FREE 64)  30 mL Per Tube TID  . folic acid  1 mg Per Tube Daily  . free water  325 mL Per Tube Q8H  . furosemide  40 mg Oral Daily  . hydrALAZINE  50 mg Per Tube Q8H  . insulin aspart  0-20 Units Subcutaneous Q4H  . insulin glargine  26 Units Subcutaneous Daily  . ipratropium-albuterol  3 mL Nebulization TID  . isosorbide dinitrate  20 mg Per Tube TID  . losartan  25 mg Oral Daily  . mouth rinse  15 mL Mouth Rinse q12n4p  . montelukast  10 mg Per Tube QHS  . pantoprazole sodium  40 mg Per Tube Daily  . predniSONE  10 mg Per Tube Q breakfast  . sodium chloride flush  10-40 mL Intracatheter Q12H  . sodium chloride flush  3 mL Intravenous Q12H  . spironolactone  25 mg Per Tube Daily    Infusions: . sodium chloride 250 mL (06/11/17 1400)  . sodium chloride    . 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  Admitted with acute/chronic respiratory failure  Assessment/Plan    1. Acute/chronic systolic CHF: Ischemic cardiomyopathy given prior history.  Echo this admission with EF down to 35-40% range, around 50% in past.  She was admitted with worsening dyspnea, elevated BNP, CXR with pulmonary edema => LHC 11/21 showed tight RCA stenosis, so may have been ischemia-mediated flash pulmonary edema.  She has diuresed while here, now on po Lasix. Volume status seems ok at this point, weight trending down.  - Continue lasix 40 mg daily. Needs BMET today (ordered).  - Continue hydral 50 mg TID/Isordil 20 tid.  Will titrate these down and up on losartan as renal function tolerates, can probably eventually transition to [facility].  Continue losartan 25 mg daily (no BMET yet today so will not titrate).   - Continue spironolactone 25 mg daily.  - Decrease bisoprolol to 2.5 mg daily with occasional sinus brady in the 40s.  2. Acute hypoxemic respiratory failure: Initially thought multifactorial due to pulmonary edema, effusions, BOOP and OHS.  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  She was diuresed.  At baseline, she is on 2L home oxygen.  Extubated 11/4 but developed respiratory distress 11/6 and re-intubated.  She had had swelling of the upper airways noted by speech/swallow exam.  Found to have MSSA PNA.  She has now completed antibiotic course.   - Improved. Back down to 3L O2 by [place], using Bipap at night.  3. CAD: h/o DES to LAD and RCA in 2011.  No chest pain but admitted with severe dyspnea/volume overload and fall in EF on echo.  Troponin mildly elevated but no trend. Cath 11/21 with severe mid RCA stenosis, treated with DES.  Suspect this may have triggered ischemic flash pulmonary edema.  - She got Brilinta with cath, will transition today to Plavix with 300 mg po load (as she will need ongoing anticoagulation for RUE DVT).  Not on ASA given anticoagulation need.  - She has not tolerated Zocor or Crestor.  Will try her on low dose pravastatin 40 mg daily.  4.  AKI: Resolved. 5.  HTN: SBP 130s-150s.  Will not increase losartan as BMET not back.  6. Code status: DNR/DNI at this time. No change.  7. NSVT: No recurrence. No change. 8. DVT: RUE, triggered by PICC. Started on bivalirudin 06/14/17 on hematology recommendation (platelets had fallen but HIT negative by SRA).   - PICC has been removed.  - Given triggered RUE DVT, would favor 3 months anticoagulation with DOAC => transitioning from bivalirudin to Eliquis with DVT dosing today.  9. Delirium: Possible ICU delirium.  Has had on and off this admission.  Afebrile.  Will send UA/culture.    Length of Stay: 48  Loralie Champagne, MD  06/18/2017 8:27 AM  Advanced Heart Failure Team Pager [phone] (M-F; 7a - 4p)  Please contact [facility] for night-coverage after hours (4p -7a ) and weekends on [email or web address]

## 2017-06-18 NOTE — Progress Notes (Signed)
PROGRESS NOTE    Kelli Velasquez  XQJ:[id number] DOB: 1940-08-13 DOA: 05/26/2017 PCP: Abner Greenspan, MD      Brief Narrative:  76 yo F with CHF, A. fib, CAD, HTN, BOOP admitted on 10/31 with SOB, was found to have MSSA pneumonia with acute respiratory failure --> intubation and extubated on 05/30/2017.  Later patient had another episode of respiratory distress requiring repeat intubation on 06/02/2017.  Currently extubated on 06/07/2017 remains on stepdown unit.  After repeat extubation patient remains aphonic. Bronchoscopy performed on 06/02/2017, cultures so far are negative.  Sputum culture is growing MSSA patient completed antibiotic course on 06/11/2017. Also found to have acute systolic CHF, cardiology consulted and currently on IV Lasix recommend inpatient cardiac cath.  Currently further plan is continue monitoring for improvement in voice quality as well as further cardiac workup.  Cath 11/21 showed single vessel disease, stented.          Assessment & Plan:  Principal Problem:   Acute respiratory failure with hypercapnia (HCC) Active Problems:   Hyperlipidemia   Severe obesity (BMI >= 40) (HCC)   Essential hypertension   CAD, NATIVE VESSEL   OVERACTIVE BLADDER   Pulmonary nodules c/w Nodular BOOP   Acute on chronic systolic congestive heart failure (HCC)   COPD exacerbation (HCC)   Unstable angina ([place])   1. Acute hypoxic respiratory failure From MSSA pneumonia, now resolved, as well as BOOP, acute on chronic combined systolic and diastolic CHF and unstable angina.  Now off antibiotics and back to home steroids from Pulmonary standpoint.  Was on home O2 earlier today.  This is mostly now resolved. -BiPAP nightly -Continue diuresis per cardiology   2. Delirium: Marked fluctuations in cognition (was interactive with family this morning, is currently obtunded and unresponsive).  Delirium first demonstrated 10/17 around the time of her second extubation.  She was  evaluated by Neurology, MRI brain normal, Neurology felt it was metabolic encephalopathy and delirium from hypernatremia, hypercarbia, Haldol/sedation from intubation, Neurology signed off.  Today, she has become again hypoactive, unresponsive, just a little while ago was interacting with husband.  Glucose now normal. UA normal.  Sodium back up to 148 despite increased free water flushes yesterday. Delirium significantly impacting patient's swallowing, ability to recover.   -Start D5W for hypernatremia -Check ABG -Obtain CT head noncontrast Delirium precautions:   -Lights and TV off, minimize interruptions at night  -Blinds open and lights on during day  -Glasses/hearing aid with patient  -Frequent reorientation  -PT/OT when able  -Avoid sedation medications/Beers list medications  3. Dysphagia and aphonia Family have indicated PEG is not within patient's goals of care -Patient is NPO -SLP consult appreciated  4.  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 fib with RVR, and CAD.  Echo showed EF 35%. Cath 11/21 showed single vessel disease, stented.   -Consult cardiology, appreciate recommendations -Continue home oral Lasix -Plavix loaded, continue plavix, anticoagulant per Cardiology -Continue hydralazine, Isordil, Spironolactone, and BB per Cardiology  5. History of BOOP -Continue prednisone back to baseline   6. MSSA pneumonia: Completed antibiotics  7. Diabetes -Continue SSI  8. Severe protein calorie malnutrition -Continue tube feeds per NG  9. DVT: UE DVT in setting of PICC.  Thrombocytopenia with negative HIT panel, avoiding heparins. -Transitioned to Eliquis today  10. Hypernatremia Mild.   -Start D5W  11. Macrocytic anemia Mild     DVT prophylaxis: Eliquis Code Status: DNR  Family Communication: Husband and son at [place] Disposition Plan: Prolonged delirium, seems to be worsening, despite  treatment of hypernatremia.  Will attain additional studies today.     Consultants:   Cardiology  CCM  Procedures:   Echocardiogram  Bronchoscopy  Intubation  Antimicrobials:   Ceftazidime 11/6 >> 11/10  Ceftriaxone 11/10 >> 11/16    Subjective: Flaccid.  On BiPAP.  Does not respond to commands. Small grimace to noxious stimuli.  Eyes closed, does not move to command.    Objective: Vitals:   06/18/17 0737 06/18/17 1022 06/18/17 1143 06/18/17 1309  BP:  (!) 127/53 (!) 127/53 110/66  Pulse:  74 75   Resp:  (!) 38 17   Temp:      TempSrc:      SpO2: 98% 100% 100%   Weight:      Height:        Intake/Output Summary (Last 24 hours) at 06/18/2017 1345 Last data filed at 06/18/2017 1100 Gross per 24 hour  Intake 1855.4 ml  Output 300 ml  Net 1555.4 ml   Filed Weights   06/17/17 0537 06/18/17 0503 06/18/17 0640  Weight: 104.8 kg (231 lb) 108.4 kg (239 lb) 108 kg (238 lb)    Examination: General appearance: Elderly obese adult female, obtunded, on BiPAP.  Skin: Warm and dry.  No suspicious rashes or lesions. Cardiac: RRR, nl S1-S2, no murmurs appreciated.  Capillary refill is brisk.  JVP not visible.  No LE edema.   Respiratory: Normal respiratory rate and rhythm.  CTAB without rales or wheezes. Abdomen: Abdomen soft, obese.  No grimace to palpation. No ascites, distension, hepatosplenomegaly.   Neuro: Obtunded.  Does not rouse to noxious stimuli.  No spontaneous eye opening or eye movement.   Psych: Unable to assess.   Data Reviewed: I have personally reviewed following labs and imaging studies:  CBC: Recent Labs  Lab 06/14/17 0500 06/15/17 0634 06/16/17 0235 06/17/17 0322 06/18/17 0529  WBC 15.6* 15.1* 12.2* 10.1 12.5*  NEUTROABS 12.7* 11.6* 9.5* 8.1* 10.1*  HGB 10.3* 10.8* 10.3* 10.6* 11.1*  HCT 37.0 37.5 35.2* 35.6* 37.3  MCV 103.1* 101.6* 101.4* 100.8* 98.7  PLT 85* 112* 111* 112* 193*   Basic Metabolic Panel: Recent Labs  Lab  06/12/17 0327  06/14/17 0500 06/15/17 0634 06/16/17 0235 06/17/17 0322 06/18/17 1111  NA 153*   < > 147* 149* 145 146* 148*  K 3.6   < > 3.5 3.6 3.5 3.5 3.3*  CL 108   < > 101 104 100* 103 106  CO2 38*   < > 39* 37* 38* 35* 35*  GLUCOSE 85   < > 154* 123* 124* 96 133*  BUN 43*   < > 35* 32* 34* 22* 22*  CREATININE 0.79   < > 0.74 0.69 0.71 0.64 0.70  CALCIUM 8.9   < > 8.8* 8.9 8.5* 8.3* 8.7*  MG 2.5*  --   --  2.4  --   --   --    < > = values in this interval not displayed.   GFR: Estimated Creatinine Clearance: 70.7 mL/min (by C-G formula based on SCr of 0.7 mg/dL). Liver Function Tests: Recent Labs  Lab 06/13/17 1520 06/15/17 0634 06/16/17 0235  AST 25 25 19   ALT 33 40 34  ALKPHOS 58 59 63  BILITOT 0.9 0.8 0.5  PROT 5.5* 5.2* 4.9*  ALBUMIN 2.8* 2.6* 2.5*   No results for input(s): LIPASE, AMYLASE in the last 168 hours. No  results for input(s): AMMONIA in the last 168 hours. Coagulation Profile: Recent Labs  Lab 06/16/17 0235  INR 1.45   Cardiac Enzymes: Recent Labs  Lab 06/12/17 0327 06/12/17 0748 06/12/17 1702  TROPONINI 0.07* 0.07* 0.07*   BNP (last 3 results) Recent Labs    01/11/17 1053  PROBNP 264.0*   HbA1C: No results for input(s): HGBA1C in the last 72 hours. CBG: Recent Labs  Lab 06/18/17 0018 06/18/17 0401 06/18/17 0823 06/18/17 1250 06/18/17 1332  GLUCAP 115* 173* 174* 136* 146*   Lipid Profile: No results for input(s): CHOL, HDL, LDLCALC, TRIG, CHOLHDL, LDLDIRECT in the last 72 hours. Thyroid Function Tests: No results for input(s): TSH, T4TOTAL, FREET4, T3FREE, THYROIDAB in the last 72 hours. Anemia Panel: No results for input(s): VITAMINB12, FOLATE, FERRITIN, TIBC, IRON, RETICCTPCT in the last 72 hours. Urine analysis:    Component Value Date/Time   COLORURINE YELLOW 06/18/2017 1116   APPEARANCEUR CLEAR 06/18/2017 1116   LABSPEC 1.017 06/18/2017 1116   PHURINE 7.0 06/18/2017 1116   GLUCOSEU NEGATIVE 06/18/2017 1116    HGBUR NEGATIVE 06/18/2017 1116   [place] 06/18/2017 1116   BILIRUBINUR neg. 06/28/2013 [place] 06/18/2017 1116   PROTEINUR NEGATIVE 06/18/2017 1116   UROBILINOGEN 0.2 06/28/2013 1041   UROBILINOGEN 0.2 07/30/2009 1435   NITRITE NEGATIVE 06/18/2017 1116   LEUKOCYTESUR NEGATIVE 06/18/2017 1116   Sepsis Labs: @LABRCNTIP (procalcitonin:4,lacticidven:4)  )No results found for this or any previous visit (from the past 240 hour(s)).       Radiology Studies: No results found.      Scheduled Meds: . apixaban  10 mg Oral BID   Followed by  . [START ON 06/25/2017] apixaban  5 mg Oral BID  . bisoprolol  2.5 mg Per Tube Daily  . chlorhexidine  15 mL Mouth Rinse BID  . [START ON 06/19/2017] clopidogrel  75 mg Oral Daily  . cyanocobalamin  1,000 mcg Subcutaneous Daily  . ezetimibe  10 mg Per Tube QHS  . feeding supplement (PRO-STAT SUGAR FREE 64)  30 mL Per Tube TID  . folic acid  1 mg Per Tube Daily  . free water  325 mL Per Tube Q8H  . furosemide  40 mg Oral Daily  . hydrALAZINE  50 mg Per Tube Q8H  . insulin aspart  0-20 Units Subcutaneous Q4H  . insulin glargine  26 Units Subcutaneous Daily  . ipratropium-albuterol  3 mL Nebulization TID  . isosorbide dinitrate  20 mg Per Tube TID  . losartan  25 mg Oral Daily  . mouth rinse  15 mL Mouth Rinse q12n4p  . montelukast  10 mg Per Tube QHS  . pantoprazole sodium  40 mg Per Tube Daily  . potassium chloride  40 mEq Oral Once  . pravastatin  40 mg Oral q1800  . predniSONE  10 mg Per Tube Q breakfast  . sodium chloride flush  10-40 mL Intracatheter Q12H  . sodium chloride flush  3 mL Intravenous Q12H  . spironolactone  25 mg Per Tube Daily   Continuous Infusions: . sodium chloride 250 mL (06/11/17 1400)  . sodium chloride    . dextrose    . feeding supplement (JEVITY 1.2 CAL) 1,000 mL (06/17/17 1900)     LOS: 23 days    Time spent: 35 minutes    Edwin Dada, MD [facility] Pager [phone]  If 7PM-7AM, please contact night-coverage [email or web address] Password [email or web address] 06/18/2017, 1:45 PM

## 2017-06-18 NOTE — Progress Notes (Deleted)
Pt. Mentality has decreased today. Responding only to pain. Unable to follow commands. MD notified. CT of head ordered. Still waiting for CT to call to head down for scan. Son at bedside. Mints on lose. No longer has wrist restraints on. Pt. On bipap. Sleeping. Unable to arouse.

## 2017-06-18 NOTE — Progress Notes (Signed)
Patient is currently on BIPAP and no respiratory distress noted. Patient is tolerating BIPAP well at this time. RT will continue to monitor as needed.

## 2017-06-18 NOTE — Progress Notes (Signed)
  Speech Language Pathology Treatment: Dysphagia  Patient Details Name: Kelli Velasquez MRN: [id number] DOB: [birth year] Today's Date: 06/18/2017 Time: 9767-3419 SLP Time Calculation (min) (ACUTE ONLY): 23 min  Assessment / Plan / Recommendation Clinical Impression  Pt is more distracted today, requiring increased cueing to sustain attention to ice chip trials and initiate pharyngeal swallows. She has mildly improved vocalization, but she cannot sustain phonation for even 1 second. SLP provided only Mod cues for her to cough on command today. After oral care and administration of ice chips, pt was better able to clear dried secretions adhered to her hard palate. Recommend that she remain NPO - discussed with RN allowing a few single pieces of ice after oral care is completed, although shortly after SLP visit she was put on BiPAP. As she remains on BiPAP, she is not appropriate for POs. Will continue to follow and progress as tolerated.    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 She had significant respiratory distress in ER and failed trial bipap requiring intubation 10/31-11/4. FEES 11/6 showed severe edema and weakness, standing secretions, and diffuse residue that could not be cleared. Recommendations at that time were to remain NPO except for a few ice chips given after oral care. Pt was reintubated 11/7-11/12 and stayed on BiPAP post-extubation. SLP was consulted to reassess swallowing.      SLP Plan  Continue with current plan of care       Recommendations  Diet recommendations: NPO Medication Administration: Via alternative means                Oral Care Recommendations: Oral care QID Follow up Recommendations: Skilled Nursing facility SLP Visit Diagnosis: Dysphagia, oropharyngeal phase (R13.12) Plan: Continue with current plan of  care       GO                Kelli Velasquez 06/18/2017, 10:49 AM  Kelli Velasquez, M.A. CCC-SLP [phone]

## 2017-06-18 NOTE — Progress Notes (Signed)
Pt removed bipap mask and this RN attempted to replace bipap mask on pt.  Pt shook head and mouthed "No." Pt oxygen saturations noted to be 94-100% on RA.  3L O2 [place] applied and respiratory therapy notified.  Respirations regular and unlabored.  22-30.

## 2017-06-18 NOTE — Progress Notes (Signed)
MD had discussion with family about Palliative care and goals. At this time family still wants to be aggressive.

## 2017-06-18 NOTE — Progress Notes (Signed)
Pt. Mentality has decreased today. Responding only to pain. Unable to follow commands. MD notified. CT of head ordered. Still waiting for CT to call to head down for scan. Son at bedside. Mints on lose. No longer has wrist restraints on. Pt. On bipap. Sleeping. Unable to arouse.  HR has dropped to 35 unsustained, pauses, HR back up to 74.

## 2017-06-18 NOTE — Progress Notes (Addendum)
ANTICOAGULATION CONSULT NOTE - Follow Up Consult  Pharmacy Consult for Bivalirudin Indication: DVT  Allergies  Allergen Reactions  . Fish Allergy Anaphylaxis  . Shellfish Allergy Anaphylaxis  . Aspirin     REACTION: nausea and vomiting High doses  . Atorvastatin     REACTION: leg pain  . Crestor [Rosuvastatin Calcium] Other (See Comments)    Muscle pain - allergy/intolerance  . Penicillins     Due to mold allergy per pt  . Simvastatin     REACTION: muscle pain  . Trandolapril     REACTION: leg pain   Patient Measurements: Height: 5\' 4"  (162.6 cm) Weight: 238 lb (108 kg) IBW/kg (Calculated) : 54.7  Vital Signs: Temp: 97.5 F (36.4 C) (11/23 0503) Temp Source: Axillary (11/23 0503) BP: 171/84 (11/23 0503) Pulse Rate: 67 (11/23 0503)  Labs: Recent Labs    06/16/17 0235 06/17/17 0322  06/17/17 1010 06/17/17 1139 06/18/17 0529  HGB 10.3* 10.6*  --   --   --  11.1*  HCT 35.2* 35.6*  --   --   --  37.3  PLT 111* 112*  --   --   --  124*  APTT 55*  --    < > 57* 59* 29  LABPROT 17.5*  --   --   --   --   --   INR 1.45  --   --   --   --   --   CREATININE 0.71 0.64  --   --   --   --    < > = values in this interval not displayed.   Estimated Creatinine Clearance: 70.7 mL/min (by C-G formula based on SCr of 0.64 mg/dL).  Medications: Bivalirudin @ 0.1mg /kg/hr  Assessment: 76yoF admitted with respiratory distress, s/p VDRF now improved.  She had drop in PLTC while on heparin sq for VTE px and PLTCs have remained low despite off heparin.  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  On 11/19 she was found to have new VTE at PICC line tip. Bivalirudin started per hematology recommendations.  APTT is subtherapeutic at 29 seconds this morning. Per RN infusion has been running overnight with no issues. Hgb stable, pltc improving, no S/Sx bleeding noted.  Goal of Therapy:  aPTT 50-85 sec seconds Monitor platelets by anticoagulation protocol: Yes   Plan:  -Increase bivalirudin to  0.15mg /kg/hr -Check aPTT in 2 hr  ADDENDUM: Transitioned to [facility] for 3 months for provoked DVT. Discussed with Dr. Aundra Dubin, given ongoing need of P2Y12 inhibitor will use Eliquis as it has shorter duration of boosted dosing compared to Xarelto for acute VTE.  Plan: -Stop bivalirudin -Eliquis 10mg  BID x7 days then 5mg  BID  Arrie Senate, PharmD, [place] PGY-2 Cardiology Pharmacy Resident Pager: [phone] 06/18/2017

## 2017-06-18 NOTE — Progress Notes (Signed)
OT Cancellation Note  Patient Details Name: Kelli Velasquez MRN: [id number] DOB: [birth year]   Cancelled Treatment:    Reason Eval/Treat Not Completed: Patient at procedure or test/ unavailable. Pt currently on bipap, family in room and requested OT hold at this time "It's a bad day" Explained that OT would not be back until next week and they verbalized understanding.  [name] 06/18/2017, 10:37 AM' [place] OTR/L [phone]

## 2017-06-18 NOTE — Progress Notes (Signed)
Pt too lethargic to perform chest PT at noon and 4. Placed back on V60 around 10:30 per pt request. ABG obtained and results within normal limits and MD aware.

## 2017-06-19 LAB — URINE CULTURE: CULTURE: NO GROWTH

## 2017-06-19 LAB — BASIC METABOLIC PANEL
Anion gap: 8 (ref 5–15)
BUN: 22 mg/dL — AB (ref 6–20)
CALCIUM: 8.4 mg/dL — AB (ref 8.9–10.3)
CHLORIDE: 99 mmol/L — AB (ref 101–111)
CO2: 36 mmol/L — ABNORMAL HIGH (ref 22–32)
CREATININE: 0.71 mg/dL (ref 0.44–1.00)
Glucose, Bld: 116 mg/dL — ABNORMAL HIGH (ref 65–99)
Potassium: 2.9 mmol/L — ABNORMAL LOW (ref 3.5–5.1)
SODIUM: 143 mmol/L (ref 135–145)

## 2017-06-19 LAB — CBC WITH DIFFERENTIAL/PLATELET
Basophils Absolute: 0 10*3/uL (ref 0.0–0.1)
Basophils Relative: 0 %
EOS ABS: 0.4 10*3/uL (ref 0.0–0.7)
EOS PCT: 4 %
HCT: 32.2 % — ABNORMAL LOW (ref 36.0–46.0)
Hemoglobin: 9.7 g/dL — ABNORMAL LOW (ref 12.0–15.0)
Lymphocytes Relative: 14 %
Lymphs Abs: 1.6 10*3/uL (ref 0.7–4.0)
MCH: 30 pg (ref 26.0–34.0)
MCHC: 30.1 g/dL (ref 30.0–36.0)
MCV: 99.7 fL (ref 78.0–100.0)
MONO ABS: 0.6 10*3/uL (ref 0.1–1.0)
MONOS PCT: 6 %
NEUTROS PCT: 77 %
Neutro Abs: 8.7 10*3/uL — ABNORMAL HIGH (ref 1.7–7.7)
PLATELETS: 103 10*3/uL — AB (ref 150–400)
RBC: 3.23 MIL/uL — ABNORMAL LOW (ref 3.87–5.11)
RDW: 17.4 % — AB (ref 11.5–15.5)
WBC: 11.2 10*3/uL — ABNORMAL HIGH (ref 4.0–10.5)

## 2017-06-19 LAB — MAGNESIUM: MAGNESIUM: 2.1 mg/dL (ref 1.7–2.4)

## 2017-06-19 LAB — GLUCOSE, CAPILLARY
GLUCOSE-CAPILLARY: 113 mg/dL — AB (ref 65–99)
Glucose-Capillary: 106 mg/dL — ABNORMAL HIGH (ref 65–99)
Glucose-Capillary: 109 mg/dL — ABNORMAL HIGH (ref 65–99)
Glucose-Capillary: 145 mg/dL — ABNORMAL HIGH (ref 65–99)
Glucose-Capillary: 155 mg/dL — ABNORMAL HIGH (ref 65–99)
Glucose-Capillary: 196 mg/dL — ABNORMAL HIGH (ref 65–99)

## 2017-06-19 MED ORDER — FREE WATER
375.0000 mL | Freq: Three times a day (TID) | Status: DC
  Administered 2017-06-19 – 2017-06-24 (×16): 375 mL

## 2017-06-19 MED ORDER — ATROPINE SULFATE 0.4 MG/ML IJ SOLN
0.4000 mg | INTRAMUSCULAR | Status: DC | PRN
  Filled 2017-06-19: qty 1

## 2017-06-19 MED ORDER — POTASSIUM CHLORIDE 10 MEQ/100ML IV SOLN: 10.0000 meq | Freq: Once | INTRAVENOUS | Status: DC

## 2017-06-19 MED ORDER — POTASSIUM CHLORIDE 20 MEQ/15ML (10%) PO SOLN
40.0000 meq | Freq: Once | ORAL | Status: AC
  Administered 2017-06-19: 40 meq via ORAL
  Filled 2017-06-19: qty 30

## 2017-06-19 MED ORDER — POTASSIUM CHLORIDE 10 MEQ/100ML IV SOLN
10.0000 meq | INTRAVENOUS | Status: AC
  Administered 2017-06-19 (×3): 10 meq via INTRAVENOUS
  Filled 2017-06-19 (×3): qty 100

## 2017-06-19 MED ORDER — SODIUM CHLORIDE 0.9 % IV BOLUS (SEPSIS)
500.0000 mL | Freq: Once | INTRAVENOUS | Status: AC
  Administered 2017-06-19: 500 mL via INTRAVENOUS

## 2017-06-19 NOTE — Significant Event (Addendum)
Called with more sinus bradycardia, nocturnal mostly while sleeping.  Holding bisoprolol for now until re-assessed in AM. When roused, rates increase to roughly normal. Symptom assessment challenging given baseline function, but per nursing as best as we can tell is asymptomatic.  Will continue to monitor

## 2017-06-19 NOTE — Consult Note (Signed)
Consultation Note Date: 06/19/2017   Patient Name: Kelli Velasquez  DOB: 1941-05-20  MRN: [id number]  Age / Sex: 76 y.o., female  PCP: Tower, Wynelle Fanny, MD Referring Physician: Edwin Dada, *  Reason for Consultation: Establishing goals of care and Psychosocial/spiritual support  HPI/Patient Profile: 76 y.o. female  with past medical history of systolic heart failure, Boop, chronic respiratory failure, obesity, coronary artery disease, admitted on 05/26/2017 with M SSA pneumonia with acute respiratory failure requiring intubation/extubation on 05/30/2017.  Later patient developed another episode of respiratory distress requiring repeat intubation on 06/02/2017.  She was extubated 06/07/2017 and is now on the stepdown unit.  After extubation patient remains aphonic.  Bronchoscopy was performed on 06/02/2017, cultures so far negative.  Sputum culture is growing M SSA.  Antibiotic course completed on 06/11/2017.  Patient also has acute systolic heart failure and cardiology was consulted and is currently on IV Lasix.  Patient also underwent a cardiac cath with stenting on 06/16/2017.  Patient's hospital course has been complicated by delirium  Consult for early goals of care, introduction to palliative medicine services, support going forward.   Clinical Assessment and Goals of Care: Met with patient, patient's husband, son and daughter.  Patient is not agitated today, husband reports "this is  her first good day in awhile".  He reports that she had a sudden loss of hearing with this admission but is now beginning to hear and speak better.  She does maintain eye contact and is currently calm, not agitated.  Family reports they are trying to be cautiously optimistic that patient can turn the corner, delirium improve, and go for rehab.  Husband states "she would haunt [place] for what we have already done to her", referring  to intubation.  Patient is a DNR and per chart review family has indicated she would not want artificial feeding.  She currently has a Cortrak and now is in receiving tube feedings.  Husband, Avina Eberle, would be her healthcare proxy.  Patient has 8 children, as well as 12 grandchildren and prior to this hospitalization had been living at home with her husband    SUMMARY OF RECOMMENDATIONS   Confirmed DNR/DNI Initial meeting with family focused on introduction to palliative medicine services and establishing rapport Family is hopeful for continued improvement with ultimate goal for her to be improved enough to go to rehab. Palliative medicine to stay involved to be part of her ongoing care team Code Status/Advance Care Planning:  DNR    Symptom Management:   Delirium: Minimize sedating medications.    Palliative Prophylaxis:   Aspiration, Bowel Regimen, Delirium Protocol, Eye Care, Frequent Pain Assessment, Oral Care and Turn Reposition   Psycho-social/Spiritual:   Desire for further Chaplaincy support:no  Additional Recommendations: ICU Family Guide  Prognosis:   Unable to determine  Discharge Planning: To Be Determined      Primary Diagnoses: Present on Admission: . (Resolved) Acute on chronic respiratory failure ([place]) . Severe obesity (BMI >= 40) (H[facility]) . Essential hypertension . CAD,  NATIVE VESSEL . OVERACTIVE BLADDER . Pulmonary nodules c/w Nodular BOOP . Hyperlipidemia   I have reviewed the medical record, interviewed the patient and family, and examined the patient. The following aspects are pertinent.  Past Medical History:  Diagnosis Date  . Allergy    allergic rhinitis  . Asthma   . Chronic bronchitis ([place])   . Coronary artery disease    cath January 2011 with DEs LAD and RCA  . Dyspnea   . Full dentures   . Gallstones   . GERD (gastroesophageal reflux disease)   . History of echocardiogram    Echo 5/17: mod LVH, EF 50-55%, ant-septal HK, Gr  1 DD, mod LAE //  b.  Echo 7/17: mild concentric LVH, EF 45-50%, inf-lat, inf, inf-septal HK, mild LAE  . History of nuclear stress test    Myoview 7/17: EF 48%, small mild apical defect, no ischemia, low risk  . Hyperlipidemia   . Hypertension   . 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 Myopia   . 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 Nocturnal oxygen desaturation    o2 at night  . Obesity   . Osteoarthritis    knees, fingers, shoulders  . Overactive bladder   . Oxygen deficiency    uses oxygen all day  . Rash    and other non specific skin eruptions  . Retaining fluid    in ankles and feet  . Urinary incontinence   . Vertigo   . Wears glasses    Social History   Socioeconomic History  . Marital status: Married    Spouse name: None  . Number of children: None  . Years of education: None  . Highest education level: None  Social Needs  . Financial resource strain: None  . Food insecurity - worry: None  . Food insecurity - inability: None  . Transportation needs - medical: None  . Transportation needs - non-medical: None  Occupational History  . None  Tobacco Use  . Smoking status: Former Smoker    Packs/day: 1.00    Years: 25.00    Pack years: 25.00    Types: Cigarettes    Last attempt to quit: 07/27/1982    Years since quitting: 34.9  . Smokeless tobacco: Never Used  Substance and Sexual Activity  . Alcohol use: Yes    Alcohol/week: 0.0 oz    Comment: wine-rare  . Drug use: No  . Sexual activity: None  Other Topics Concern  . None  Social History Narrative  . None   Family History  Problem Relation Age of Onset  . Stroke Mother   . Diabetes Mother 77  . Stroke Father   . Heart failure Father   . Breast cancer Maternal Grandmother   . Colon cancer Neg Hx    Scheduled Meds: . apixaban  10 mg Oral BID   Followed by  . [START ON 06/25/2017] apixaban  5 mg Oral BID  .  chlorhexidine  15 mL Mouth Rinse BID  . clopidogrel  75 mg Oral Daily  . cyanocobalamin  1,000 mcg Subcutaneous Daily  . ezetimibe  10 mg Per Tube QHS  . feeding supplement (PRO-STAT SUGAR FREE 64)  30 mL Per Tube TID  . folic acid  1 mg Per Tube Daily  . free water  375 mL Per Tube Q8H  . furosemide  40 mg Oral Daily  .  hydrALAZINE  50 mg Per Tube Q8H  . insulin aspart  0-20 Units Subcutaneous Q4H  . insulin glargine  26 Units Subcutaneous Daily  . ipratropium-albuterol  3 mL Nebulization TID  . isosorbide dinitrate  20 mg Per Tube TID  . losartan  25 mg Oral Daily  . mouth rinse  15 mL Mouth Rinse q12n4p  . montelukast  10 mg Per Tube QHS  . pantoprazole sodium  40 mg Per Tube Daily  . pravastatin  40 mg Oral q1800  . predniSONE  10 mg Per Tube Q breakfast  . sodium chloride flush  10-40 mL Intracatheter Q12H  . sodium chloride flush  3 mL Intravenous Q12H  . spironolactone  25 mg Per Tube Daily   Continuous Infusions: . sodium chloride 250 mL (06/11/17 1400)  . sodium chloride    . feeding supplement (JEVITY 1.2 CAL) 1,000 mL (06/19/17 0946)  . potassium chloride     PRN Meds:.sodium chloride, sodium chloride, albuterol, atropine, Gerhardt's butt cream, hydrALAZINE, metoprolol tartrate, sodium chloride flush Medications Prior to Admission:  Prior to Admission medications   Medication Sig Start Date End Date Taking? Authorizing Provider  acetaminophen (TYLENOL) 325 MG tablet Take 650 mg by mouth every 6 (six) hours as needed for mild pain. Take as needed per bottle    Yes [provider]  acetaminophen-codeine (TYLENOL #3) 300-30 MG tablet One every 4 hours as needed for cough 06/23/16  Yes Tanda Rockers, MD  amLODipine (NORVASC) 10 MG tablet Take 1 tablet (10 mg total) by mouth daily. 02/02/17  Yes Tower, Wynelle Fanny, MD  aspirin 81 MG tablet Take 81 mg by mouth daily.   Yes [provider]  chlorpheniramine (CHLOR-TRIMETON) 4 MG tablet Take 4 mg by mouth every  morning.    Yes [provider]  cromolyn (NASALCROM) 5.2 MG/ACT nasal spray Place 2 sprays into both nostrils daily as needed for allergies.   Yes [provider]  cyanocobalamin (,VITAMIN B-12,) 1000 MCG/ML injection Inject 1,000 mcg into the muscle every 3 (three) months.   Yes [provider]  Cyanocobalamin (VITAMIN B12) 3000 MCG SUBL Place 1 capsule under the tongue daily.   Yes [provider]  dextromethorphan-guaiFENesin (MUCINEX DM) 30-600 MG 12hr tablet Take 1 tablet by mouth 2 (two) times daily as needed for cough.   Yes [provider]  EPIPEN 2-PAK 0.3 MG/0.3ML SOAJ injection INJECT 0.3 MLS (0.3 MG TOTAL) INTO THE MUSCLE ONCE AS NEEDED FOR ALLERGIC REACTION 02/07/15  Yes Tower, Wynelle Fanny, MD  esomeprazole (NEXIUM) 40 MG capsule Take 1 capsule (40 mg total) by mouth daily. 02/02/17  Yes Tower, Wynelle Fanny, MD  ezetimibe (ZETIA) 10 MG tablet TAKE 1 TABLET (10 MG TOTAL) BY MOUTH DAILY. 02/02/17  Yes Tower, Wynelle Fanny, MD  furosemide (LASIX) 20 MG tablet TAKE 1 TABLET BY MOUTH EVERY DAY 03/30/17  Yes Tanda Rockers, MD  furosemide (LASIX) 40 MG tablet Take 1 tablet (40 mg total) by mouth daily. Take 1 daily as needed for extra swelling Patient taking differently: Take 40 mg by mouth daily as needed for fluid. Take 1 daily as needed for extra swelling  04/26/17  Yes Tanda Rockers, MD  montelukast (SINGULAIR) 10 MG tablet Take 1 tablet (10 mg total) by mouth at bedtime. 02/02/17  Yes Tower, Wynelle Fanny, MD  Multiple Vitamin (MULTIVITAMIN) capsule Take 1 capsule by mouth daily.   Yes [provider]  OXYGEN Inhale 3 L into the lungs  continuous. 24/7 2 lpm  [employer]   Yes [provider]  potassium chloride SA (KLOR-CON M20) 20 MEQ tablet Take 1 tablet (20 mEq total) by mouth daily as needed. Patient taking differently: Take 20 mEq by mouth daily.  02/02/17  Yes Tower, Marne A, MD  PREDNISONE PO Take 15-20 mg by mouth daily. Take 20 mg one day and 15  mg the next days   Yes [provider]  PROAIR HFA 108 (90 BASE) MCG/ACT inhaler INHALE 2 PUFFS BY MOUTH EVERY 4 HOURS AS NEEDED FOR WHEEZING OR FOR SHORTNESS OF BREATH 02/08/15  Yes Tower, Marne A, MD  ranitidine (ZANTAC) 75 MG tablet Take 300 mg by mouth at bedtime.    Yes [provider]   Allergies  Allergen Reactions  . Fish Allergy Anaphylaxis  . Shellfish Allergy Anaphylaxis  . Aspirin     REACTION: nausea and vomiting High doses  . Atorvastatin     REACTION: leg pain  . Crestor [Rosuvastatin Calcium] Other (See Comments)    Muscle pain - allergy/intolerance  . Penicillins     Due to mold allergy per pt  . Simvastatin     REACTION: muscle pain  . Trandolapril     REACTION: leg pain   Review of Systems  Unable to perform ROS: Acuity of condition    Physical Exam  Constitutional: She appears well-developed and well-nourished.  Acutely ill-appearing older female.  No acute distress  Neck: Normal range of motion.  Cardiovascular:  Bradycardic Periorbital edema  Pulmonary/Chest:  No increased work of breathing at rest  Abdominal: She exhibits distension.  Neurological:  Response to voice Her voice quality is very weak but is able to answer a few simple questions She reports that she is beginning to hear things better  Skin: Skin is warm and dry. Ecchymosis noted.  Psychiatric:  Patient is currently calm, making good eye contact  Nursing note and vitals reviewed.   Vital Signs: BP (!) 124/49 (BP Location: Right Leg)   Pulse 74   Temp 99.1 F (37.3 C) (Oral)   Resp (!) 28   Ht 5' 4"  (1.626 m)   Wt 109.8 kg (242 lb)   LMP 07/27/1980   SpO2 97%   BMI 41.54 kg/m  Pain Assessment: Faces POSS *See Group Information*: 1-Acceptable,Awake and alert Pain Score: 1    SpO2: SpO2: 97 % O2 Device:SpO2: 97 % O2 Flow Rate: .O2 Flow Rate (L/min): 3 L/min  IO: Intake/output summary:   Intake/Output Summary (Last 24 hours) at 06/19/2017 1512 Last  data filed at 06/19/2017 1314 Gross per 24 hour  Intake 1436.75 ml  Output -  Net 1436.75 ml    LBM: Last BM Date: 06/17/17 Baseline Weight: Weight: 112 kg (247 lb) Most recent weight: Weight: 109.8 kg (242 lb)     Palliative Assessment/Data:   Flowsheet Rows     Most Recent Value  Intake Tab  Referral Department  Hospitalist  Unit at Time of Referral  Intermediate Care Unit  Palliative Care Primary Diagnosis  Pulmonary  Date Notified  06/19/17  Palliative Care Type  New Palliative care  Reason for referral  Clarify Goals of Care  Date of Admission  05/26/17  Date first seen by Palliative Care  06/19/17  # of days Palliative referral response time  0 Day(s)  # of days IP prior to Palliative referral  24  Clinical Assessment  Palliative Performance Scale Score  30%  Pain Max last 24 hours  Not  able to report  Pain Min Last 24 hours  Not able to report  Dyspnea Max Last 24 Hours  Not able to report  Dyspnea Min Last 24 hours  Not able to report  Nausea Max Last 24 Hours  Not able to report  Nausea Min Last 24 Hours  Not able to report  Anxiety Max Last 24 Hours  Not able to report  Anxiety Min Last 24 Hours  Not able to report  Other Max Last 24 Hours  Not able to report  Psychosocial & Spiritual Assessment  Palliative Care Outcomes  Patient/Family meeting held?  Yes  Who was at the meeting?  husband, son Catalina [place], dtr [facility]  Patient/Family wishes: Interventions discontinued/not started   PEG  Palliative Care follow-up planned  Yes, Facility      Time In: 1430 Time Out: 1525 Time Total: 55 min Greater than 50%  of this time was spent counseling and coordinating care related to the above assessment and plan. Staffed with Dr. Eudelia Bunch  Signed by: Dory Horn, NP   Please contact Palliative Medicine Team phone at [phone] for questions and concerns.  For individual provider: See Shea Evans

## 2017-06-19 NOTE — Progress Notes (Signed)
  Speech Language Pathology Treatment: Dysphagia  Patient Details Name: Kelli Velasquez MRN: [id number] DOB: 1941-06-25 Today's Date: 06/19/2017 Time: 2703-5009 SLP Time Calculation (min) (ACUTE ONLY): 17 min  Assessment / Plan / Recommendation Clinical Impression  Patient on nasal cannula, alert and pleasant this am. Much of treatment session focused on oral care as patient's lips and tongue coated with thick dried green secretions s/p FEES completed 2 days prior. Thorough oral care complete in order to facilitate improved movement of oral structures, facilitate safest intake of trial pos, and decrease risk of aspiration related infection. Ice chip trials provided under skilled observation with moderate cueing for effortful swallow to facilitate improved strength of swallowing musculature. Patient with continued indication of decreased airway protection in approximately 75% of trials characterized by moderate weak cough response. Vocal quality remains severely dysphonic indicative of continued decrease in glottal closure which is likely contributing. Patient able to consistently follow commands today as well as demonstrate carry over of swallowing strategies taught in today's session with min cueing by session end. As long as off bipap, recommend ice chips after thorough oral care to facilitate use of swallowing musculature and hydration of oropharyngeal mucosa. SLP will continue to f/u for readiness for repeat instrumental testing with continued improvements in vocal quality, mentation, and strength of cough.    HPI HPI: Kelli Velasquez (quit 1984) with hx CAD, HTN, CHF, BOOP followed by Dr. Melvyn Novas on chronic prednisone presented 10/31 with progressive SOB, cough, wheezing, BLE edema. She had significant respiratory distress in ER and failed trial bipap requiring intubation 10/31-11/4. FEES 11/6 showed severe edema and weakness, standing secretions, and diffuse residue that could not be cleared.  Recommendations at that time were to remain NPO except for a few ice chips given after oral care. Pt was reintubated 11/7-11/12 and stayed on BiPAP post-extubation. SLP was consulted to reassess swallowing.      SLP Plan  Continue with current plan of care       Recommendations  Diet recommendations: NPO(except ice chips after oral care) Medication Administration: Via alternative means                Oral Care Recommendations: Oral care prior to ice chip/H20;Oral care QID Follow up Recommendations: Skilled Nursing facility SLP Visit Diagnosis: Dysphagia, oropharyngeal phase (R13.12) Plan: Continue with current plan of care       [facility] [place], [place] [phone]                 Kelli Velasquez 06/19/2017, 8:36 AM

## 2017-06-19 NOTE — Progress Notes (Signed)
Take pt off and placed on [place]

## 2017-06-19 NOTE — Progress Notes (Signed)
Pt with increasing episodes of sinus bradycardia.  Dr. Lamona Curl with cardiology notified.  Pt responsive to verbal stimuli and nods appropriately when asked simple yes/no questions.  pt currently on bipap at this time. BP 132/42 Oxygen saturations 96% on bipap RR 21 even and unlabored.  Attempted to obtain ekg but pt became agitated and pulled leads off.  MD notified of inability to obtain EKG due to agitation.  Order received to discontinue bisoprolol.

## 2017-06-19 NOTE — Progress Notes (Signed)
Pt HR decreasing to 27 non-sustained.  Pt responsive to verbal stimuli and able to track movement with eyes.  Mentation at baseline prior to bradycardic event.  Dr. Maudie Mercury notified.  Other VSS.  Order received for AM magnesium level and to notify cardiology during rounds in AM re: bradycardic events.

## 2017-06-19 NOTE — Progress Notes (Signed)
CPT not done threw flutter due to patient being on the BiPAP. Will continue to monitor as needed.

## 2017-06-19 NOTE — Progress Notes (Signed)
PROGRESS NOTE    Kelli Velasquez  AST:[id number] DOB: [birth year] DOA: 05/26/2017 PCP: Abner Greenspan, MD      Brief Narrative:  76 yo F with CHF, A. fib, CAD, HTN, BOOP admitted on 10/31 with SOB, was found to have MSSA pneumonia with acute respiratory failure --> intubation and extubated on 05/30/2017.  Later patient had another episode of respiratory distress requiring repeat intubation on 06/02/2017.  Currently extubated on 06/07/2017 remains on stepdown unit.  After repeat extubation patient remains aphonic. Bronchoscopy performed on 06/02/2017, cultures so far are negative.  Sputum culture is growing MSSA patient completed antibiotic course on 06/11/2017. Also found to have acute systolic CHF, cardiology consulted and currently on IV Lasix recommend inpatient cardiac cath.  Currently further plan is continue monitoring for improvement in voice quality as well as further cardiac workup.  Cath 11/21 showed single vessel disease, stented.          Assessment & Plan:  Principal Problem:   Acute respiratory failure with hypercapnia (HCC) Active Problems:   Hyperlipidemia   Severe obesity (BMI >= 40) (HCC)   Essential hypertension   CAD, NATIVE VESSEL   OVERACTIVE BLADDER   Pulmonary nodules c/w Nodular BOOP   Acute on chronic systolic congestive heart failure (HCC)   COPD exacerbation (HCC)   Unstable angina ([place])   1. Acute hypoxic respiratory failure from CHF, coronary ischemia From acute on chronic combined systolic and diastolic CHF and unstable angina.  MSSA pneumonia, now resolved, as well as BOOP also contributing.  Now off antibiotics and back to home level O2, home steroids from Pulmonary standpoint. This is mostly now resolved. -BiPAP nightly -Continue Lasix daily -Monitor I/Os closely, up 4L on admission, net   2. Delirium: From hyperNa, hypercarbia, hypoxia, and ICU delirium.  Improved today.  Na normalized with fluids.  On BiPAP through day yesterday (had been  hypercarbic).   CT head yesterday normal.   -Consult Palliative Care, for help coordinating discharge planning, overall goals of care Delirium precautions:   -Lights and TV off, minimize interruptions at night  -Blinds open and lights on during day  -Glasses/hearing aid with patient  -Frequent reorientation  -PT/OT when able  -Avoid sedation medications/Beers list medications  3. Dysphagia and aphonia Family have indicated PEG is not within patient's goals of care.  This seems to be progressing per SLP. -Patient is NPO, but may have ice chips -SLP consult appreciated  4. Bradycardia New problem.  Asymptomatic, while sleeping.   -Supplement K and mag  5.  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 fib with RVR, and CAD.  Echo showed EF 35%. Cath 11/21 showed single vessel disease, stented.   -Consult cardiology, appreciate recommendations -Continue home oral Lasix -Plavix loaded, continue plavix, anticoagulant per Cardiology -Continue hydralazine, Isordil, Spironolactone, and BB per Cardiology  6. History of BOOP -Continue prednisone back to baseline   7. MSSA pneumonia: Completed antibiotics  8. Diabetes -Continue SSI  9. Severe protein calorie malnutrition -Continue tube feeds per NG  10. DVT: UE DVT in setting of PICC.  Thrombocytopenia with negative HIT panel, avoiding heparins. -Transitioned to Eliquis today  11. Hypernatremia Mild.   -Increased free water flushes -Daily BMP  12. Macrocytic anemia Mild  13. Hypokalemia Mag nl. -Supplement        DVT prophylaxis: Eliquis Code Status: DNR Family Communication: Husband and son in law and daughter at bedside Disposition Plan: Prolonged  delirium, delaying ability to swallow.  Continue to work with SLP, PT.  Palliative care will be involved for help with transition of care, further goals.     Consultants:    Cardiology  CCM  Palliative  Procedures:   Echocardiogram  Bronchoscopy  Intubation  Antimicrobials:   Ceftazidime 11/6 >> 11/10  Ceftriaxone 11/10 >> 11/16    Subjective: Weak, no complaints.  Voice weak.  Oriented to person, family, date.  Thinks she is in [place].     Objective: Vitals:   06/19/17 0400 06/19/17 0743 06/19/17 0745 06/19/17 0748  BP: (!) 132/42 (!) 120/53  (!) 120/53  Pulse: 69 67  61  Resp: 19 19  20   Temp:  99.2 F (37.3 C)    TempSrc:  Axillary    SpO2: 98% 99% 99% 99%  Weight:      Height:        Intake/Output Summary (Last 24 hours) at 06/19/2017 1026 Last data filed at 06/19/2017 0606 Gross per 24 hour  Intake 1488.75 ml  Output 300 ml  Net 1188.75 ml   Filed Weights   06/18/17 0503 06/18/17 0640 06/19/17 0243  Weight: 108.4 kg (239 lb) 108 kg (238 lb) 109.8 kg (242 lb)    Examination: General appearance: Elderly obese adult female, lying in bed, very deconditioned.  Skin: Warm and dry.  No suspicious rashes or lesions.  Many bruises. Cardiac: RRR, nl S1-S2, no murmurs appreciated.  Capillary refill is brisk.  JVP not visible.  Trace LE edema.   Respiratory: Normal respiratory rate and rhythm.  Diminished respirations, I do not appreciate rales or wheezes. Abdomen: Abdomen soft, obese.  No TTP, exam limited by habitus.   Neuro: Not oriented to place, oriented to family, nursing, date.  Moves extremities to command.  Hypoactive, globally very weak. Psych: Fluctuating mentation, still.      Data Reviewed: I have personally reviewed following labs and imaging studies:  CBC: Recent Labs  Lab 06/15/17 0634 06/16/17 0235 06/17/17 0322 06/18/17 0529 06/19/17 0330  WBC 15.1* 12.2* 10.1 12.5* 11.2*  NEUTROABS 11.6* 9.5* 8.1* 10.1* 8.7*  HGB 10.8* 10.3* 10.6* 11.1* 9.7*  HCT 37.5 35.2* 35.6* 37.3 32.2*  MCV 101.6* 101.4* 100.8* 98.7 99.7  PLT 112* 111* 112* 124* 789*   Basic Metabolic Panel: Recent Labs  Lab  06/15/17 0634 06/16/17 0235 06/17/17 0322 06/18/17 1111 06/19/17 0330  NA 149* 145 146* 148* 143  K 3.6 3.5 3.5 3.3* 2.9*  CL 104 100* 103 106 99*  CO2 37* 38* 35* 35* 36*  GLUCOSE 123* 124* 96 133* 116*  BUN 32* 34* 22* 22* 22*  CREATININE 0.69 0.71 0.64 0.70 0.71  CALCIUM 8.9 8.5* 8.3* 8.7* 8.4*  MG 2.4  --   --   --  2.1   GFR: Estimated Creatinine Clearance: 71.3 mL/min (by C-G formula based on SCr of 0.71 mg/dL). Liver Function Tests: Recent Labs  Lab 06/13/17 1520 06/15/17 0634 06/16/17 0235  AST 25 25 19   ALT 33 40 34  ALKPHOS 58 59 63  BILITOT 0.9 0.8 0.5  PROT 5.5* 5.2* 4.9*  ALBUMIN 2.8* 2.6* 2.5*   No results for input(s): LIPASE, AMYLASE in the last 168 hours. No results for input(s): AMMONIA in the last 168 hours. Coagulation Profile: Recent Labs  Lab 06/16/17 0235  INR 1.45   Cardiac Enzymes: Recent Labs  Lab 06/12/17 1702  TROPONINI 0.07*   BNP (last 3 results) Recent Labs    01/11/17 1053  PROBNP 264.0*   HbA1C: No results for input(s): HGBA1C in the last 72 hours. CBG: Recent Labs  Lab 06/18/17 1642 06/18/17 2003 06/18/17 2319 06/19/17 0316 06/19/17 0743  GLUCAP 157* 141* 81 113* 106*   Lipid Profile: No results for input(s): CHOL, HDL, LDLCALC, TRIG, CHOLHDL, LDLDIRECT in the last 72 hours. Thyroid Function Tests: No results for input(s): TSH, T4TOTAL, FREET4, T3FREE, THYROIDAB in the last 72 hours. Anemia Panel: No results for input(s): VITAMINB12, FOLATE, FERRITIN, TIBC, IRON, RETICCTPCT in the last 72 hours. Urine analysis:    Component Value Date/Time   COLORURINE YELLOW 06/18/2017 1116   APPEARANCEUR CLEAR 06/18/2017 1116   LABSPEC 1.017 06/18/2017 1116   PHURINE 7.0 06/18/2017 1116   GLUCOSEU NEGATIVE 06/18/2017 1116   HGBUR NEGATIVE 06/18/2017 1116   [place] 06/18/2017 1116   BILIRUBINUR neg. 06/28/2013 1041   KETONESUR NEGATIVE 06/18/2017 1116   PROTEINUR NEGATIVE 06/18/2017 1116   UROBILINOGEN  0.2 06/28/2013 1041   UROBILINOGEN 0.2 07/30/2009 1435   NITRITE NEGATIVE 06/18/2017 1116   LEUKOCYTESUR NEGATIVE 06/18/2017 1116   Sepsis Labs: @LABRCNTIP (procalcitonin:4,lacticidven:4)  ) Recent Results (from the past 240 hour(s))  Culture, Urine     Status: None   Collection Time: 06/18/17 11:09 AM  Result Value Ref Range Status   Specimen Description URINE, CLEAN CATCH  Final   Special Requests NONE  Final   Culture NO GROWTH  Final   Report Status 06/19/2017 FINAL  Final         Radiology Studies: Ct Head Wo Contrast  Result Date: 06/18/2017 CLINICAL DATA:  Initial evaluation for acute change in mental status. EXAM: CT HEAD WITHOUT CONTRAST TECHNIQUE: Contiguous axial images were obtained from the base of the skull through the vertex without intravenous contrast. COMPARISON:  Prior MRI from 06/12/2017. FINDINGS: Brain: Generalized age related cerebral atrophy, stable. 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 No acute intracranial hemorrhage. No acute large vessel territory infarct. No mass lesion, midline shift, or mass effect. No hydrocephalus. No other extra-axial fluid collection. Vascular: No hyperdense vessel. Scattered vascular calcifications noted within the carotid siphons. Skull: Scalp soft tissues and calvarium within normal limits. Sinuses/Orbits: Globes and orbital soft tissues demonstrate no acute abnormality. Mild exophthalmos noted. Patient status post lens extraction bilaterally. Right sphenoid sinus disease again noted. Trace layering opacity noted within the left sphenoid sinus. Paranasal sinuses otherwise largely clear. Nasogastric tube in place. Chronic bilateral mastoid effusions. Other: None. IMPRESSION: 1. Stable appearance of the brain. No acute intracranial process identified. 2. Right sphenoid sinus disease with chronic bilateral mastoid effusions, stable. Electronically Signed   By:  Jeannine Boga M.D.   On: 06/18/2017 19:26        Scheduled Meds: . apixaban  10 mg Oral BID   Followed by  . [START ON 06/25/2017] apixaban  5 mg Oral BID  . chlorhexidine  15 mL Mouth Rinse BID  . clopidogrel  75 mg Oral Daily  . cyanocobalamin  1,000 mcg Subcutaneous Daily  . ezetimibe  10 mg Per Tube QHS  . feeding supplement (PRO-STAT SUGAR FREE 64)  30 mL Per Tube TID  . folic acid  1 mg Per Tube Daily  . free water  375 mL Per Tube Q8H  . furosemide  40 mg Oral Daily  . hydrALAZINE  50 mg Per Tube Q8H  . insulin aspart  0-20 Units Subcutaneous Q4H  . insulin glargine  26 Units Subcutaneous Daily  .  ipratropium-albuterol  3 mL Nebulization TID  . isosorbide dinitrate  20 mg Per Tube TID  . losartan  25 mg Oral Daily  . mouth rinse  15 mL Mouth Rinse q12n4p  . montelukast  10 mg Per Tube QHS  . pantoprazole sodium  40 mg Per Tube Daily  . pravastatin  40 mg Oral q1800  . predniSONE  10 mg Per Tube Q breakfast  . sodium chloride flush  10-40 mL Intracatheter Q12H  . sodium chloride flush  3 mL Intravenous Q12H  . spironolactone  25 mg Per Tube Daily   Continuous Infusions: . sodium chloride 250 mL (06/11/17 1400)  . sodium chloride    . feeding supplement (JEVITY 1.2 CAL) 1,000 mL (06/19/17 0946)  . potassium chloride 10 mEq (06/19/17 0929)  . potassium chloride       LOS: 24 days    Time spent: 25 minutes    Edwin Dada, MD [facility] Pager [phone]  If 7PM-7AM, please contact night-coverage [email or web address] Password [email or web address] 06/19/2017, 10:26 AM

## 2017-06-20 DIAGNOSIS — Z515 Encounter for palliative care: Secondary | ICD-10-CM

## 2017-06-20 LAB — CBC WITH DIFFERENTIAL/PLATELET
BASOS PCT: 0 %
Basophils Absolute: 0 10*3/uL (ref 0.0–0.1)
EOS ABS: 0.5 10*3/uL (ref 0.0–0.7)
EOS PCT: 4 %
HCT: 32 % — ABNORMAL LOW (ref 36.0–46.0)
HEMOGLOBIN: 9.5 g/dL — AB (ref 12.0–15.0)
LYMPHS ABS: 1.7 10*3/uL (ref 0.7–4.0)
Lymphocytes Relative: 15 %
MCH: 29.8 pg (ref 26.0–34.0)
MCHC: 29.7 g/dL — AB (ref 30.0–36.0)
MCV: 100.3 fL — ABNORMAL HIGH (ref 78.0–100.0)
MONO ABS: 0.5 10*3/uL (ref 0.1–1.0)
MONOS PCT: 5 %
NEUTROS PCT: 76 %
Neutro Abs: 8.6 10*3/uL — ABNORMAL HIGH (ref 1.7–7.7)
PLATELETS: 109 10*3/uL — AB (ref 150–400)
RBC: 3.19 MIL/uL — ABNORMAL LOW (ref 3.87–5.11)
RDW: 18.3 % — AB (ref 11.5–15.5)
WBC: 11.3 10*3/uL — ABNORMAL HIGH (ref 4.0–10.5)

## 2017-06-20 LAB — BASIC METABOLIC PANEL
Anion gap: 5 (ref 5–15)
BUN: 21 mg/dL — AB (ref 6–20)
CALCIUM: 8.4 mg/dL — AB (ref 8.9–10.3)
CHLORIDE: 97 mmol/L — AB (ref 101–111)
CO2: 36 mmol/L — ABNORMAL HIGH (ref 22–32)
CREATININE: 0.66 mg/dL (ref 0.44–1.00)
GFR calc non Af Amer: >60 mL/min (ref >60)
Glucose, Bld: 133 mg/dL — ABNORMAL HIGH (ref 65–99)
Potassium: 3.9 mmol/L (ref 3.5–5.1)
SODIUM: 138 mmol/L (ref 135–145)

## 2017-06-20 LAB — GLUCOSE, CAPILLARY
GLUCOSE-CAPILLARY: 127 mg/dL — AB (ref 65–99)
GLUCOSE-CAPILLARY: 106 mg/dL — AB (ref 65–99)
GLUCOSE-CAPILLARY: 180 mg/dL — AB (ref 65–99)
GLUCOSE-CAPILLARY: 171 mg/dL — AB (ref 65–99)
Glucose-Capillary: 125 mg/dL — ABNORMAL HIGH (ref 65–99)

## 2017-06-20 MED ORDER — POTASSIUM CHLORIDE 20 MEQ/15ML (10%) PO SOLN
40.0000 meq | Freq: Once | ORAL | Status: AC
Start: 2017-06-20 — End: 2017-06-20
  Administered 2017-06-20: 40 meq via ORAL
  Filled 2017-06-20: qty 30

## 2017-06-20 NOTE — Progress Notes (Signed)
Pt had to be placed back on BIPAP, flutter no initiated. RT will continue to monitor.

## 2017-06-20 NOTE — Progress Notes (Signed)
PROGRESS NOTE    Kelli Velasquez  ZOX:[id number] DOB: September 08, 1940 DOA: 05/26/2017 PCP: Abner Greenspan, MD      Brief Narrative:  76 yo F with CHF, A. fib, CAD, HTN, BOOP admitted on 10/31 with SOB, was found to have MSSA pneumonia with acute respiratory failure --> intubation and extubated on 05/30/2017.  Later patient had another episode of respiratory distress requiring repeat intubation on 06/02/2017.  Currently extubated on 06/07/2017 remains on stepdown unit.  After repeat extubation patient remains aphonic. Bronchoscopy performed on 06/02/2017, cultures so far are negative.  Sputum culture is growing MSSA patient completed antibiotic course on 06/11/2017. Also found to have acute systolic CHF, cardiology consulted and currently on IV Lasix recommend inpatient cardiac cath.  Currently further plan is continue monitoring for improvement in voice quality as well as further cardiac workup.  Cath 11/21 showed single vessel disease, stented.          Assessment & Plan:  Principal Problem:   Acute respiratory failure with hypercapnia (HCC) Active Problems:   Hyperlipidemia   Severe obesity (BMI >= 40) (HCC)   Essential hypertension   CAD, NATIVE VESSEL   OVERACTIVE BLADDER   Pulmonary nodules c/w Nodular BOOP   Acute on chronic systolic congestive heart failure (HCC)   COPD exacerbation (HCC)   Unstable angina ([place])   1. Acute hypoxic respiratory failure from CHF, coronary ischemia From acute on chronic combined systolic and diastolic CHF and unstable angina.  MSSA pneumonia, now resolved, as well as BOOP also contributing.  Now off antibiotics and back to home level O2, home steroids from Pulmonary standpoint. This is mostly now resolved. -BiPAP nightly -Continue Lasix daily -Monitor I/Os closely    2. Delirium: From hyperNa, hypercarbia, hypoxia, and ICU delirium.  Improved again today.  Na normalized with fluids.   CT head yesterday normal.   -Consult Palliative Care, for  help coordinating discharge planning, overall goals of care, appreciate input Delirium precautions:   -Lights and TV off, minimize interruptions at night  -Blinds open and lights on during day  -Glasses/hearing aid with patient  -Frequent reorientation  -PT/OT when able  -Avoid sedation medications/Beers list medications  3. Dysphagia and aphonia Family have indicated PEG is not within patient's goals of care.  This seems to be progressing per SLP. -Patient is NPO, but may have ice chips, hopefully can continue to advance diet -SLP consult appreciated  4. Bradycardia and hypotension These are intermittnet but not concurrent.  Bradycardia is asymptomatic, while sleeping.  -Conservative hold parameters on Isordil and Losartan -Stopped hydral and BB given hypotension and bradycardia  5.  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 fib with RVR, and CAD.  Echo showed EF 35%. Cath 11/21 showed single vessel disease, stented.   -Consult cardiology, appreciate recommendations -Continue home oral Lasix -Continue Plavix -Continue Isordil, Spironolactone per Cardiology -Stopped BB given hypotension and bradycardia -Stopped hydralazine given hypotension  6. History of BOOP -Continue prednisone back to baseline   7. MSSA pneumonia: Completed antibiotics  8. Diabetes -Continue SSI  9. Severe protein calorie malnutrition -Continue tube feeds per NG  10. DVT: UE DVT in setting of PICC.  Thrombocytopenia with negative HIT panel, avoiding heparins. -Continue Eliquis  11. Hypernatremia Mild.   -Free water flushes -Daily BMP  12. Macrocytic anemia Mild  13. Hypokalemia Mag nl. -Supplement        DVT prophylaxis: Eliquis Code Status: DNR Family  Communication: None present Disposition Plan: Prolonged delirium, delaying ability to swallow.  Continue to work with SLP, PT.  Palliative care involved for help with  transition of care, further goals.    Likely inpatient rehab in next few days, if stable.   Consultants:   Cardiology  CCM  Palliative  Procedures:   Echocardiogram  Bronchoscopy  Intubation  Antimicrobials:   Ceftazidime 11/6 >> 11/10  Ceftriaxone 11/10 >> 11/16    Subjective: Awake, responsive.  Wants to do some knitting.  Knows she is in hospital, oriented to self and year.  Voice weak, but better.  No fever, pain, chest pain, dyspnea.  Cough present.  Objective: Vitals:   06/19/17 2346 06/20/17 0329 06/20/17 0741 06/20/17 0840  BP: (!) 108/52 (!) 115/45 123/67   Pulse: 74 73 81   Resp: (!) 28 (!) 25 (!) 30   Temp: 98.3 F (36.8 C) 98 F (36.7 C) 98.8 F (37.1 C)   TempSrc: Axillary Axillary Oral   SpO2: 100% 98% 99% 100%  Weight:  108.9 kg (240 lb)    Height:        Intake/Output Summary (Last 24 hours) at 06/20/2017 1005 Last data filed at 06/20/2017 0900 Gross per 24 hour  Intake 3816.08 ml  Output 250 ml  Net 3566.08 ml   Filed Weights   06/18/17 0640 06/19/17 0243 06/20/17 0329  Weight: 108 kg (238 lb) 109.8 kg (242 lb) 108.9 kg (240 lb)    Examination: General appearance: Elderly obese adult female, lying in bed, very deconditioned but interactive.  Skin: Warm and dry.  Many bruises.  Puffy. Cardiac: RRR, nl S1-S2, no murmurs appreciated.  Capillary refill is brisk.  JVP not visible.  Anasarcic.   Respiratory: Normal respiratory rate and rhythm.  Diminished respirations, I do not appreciate rales or wheezes. Abdomen: Abdomen soft, obese.  No TTP, exam limited by habitus.   Neuro: Oriented to year, situation.  Pleasant.   Moves extremities to command.  More alert today. Psych: Pleasant, joking with me or attempting to, despite difficulty speaking.  Wants to knit.      Data Reviewed: I have personally reviewed following labs and imaging studies:  CBC: Recent Labs  Lab 06/16/17 0235 06/17/17 0322 06/18/17 0529 06/19/17 0330  06/20/17 0427  WBC 12.2* 10.1 12.5* 11.2* 11.3*  NEUTROABS 9.5* 8.1* 10.1* 8.7* 8.6*  HGB 10.3* 10.6* 11.1* 9.7* 9.5*  HCT 35.2* 35.6* 37.3 32.2* 32.0*  MCV 101.4* 100.8* 98.7 99.7 100.3*  PLT 111* 112* 124* 103* 245*   Basic Metabolic Panel: Recent Labs  Lab 06/15/17 0634 06/16/17 0235 06/17/17 0322 06/18/17 1111 06/19/17 0330 06/20/17 0427  NA 149* 145 146* 148* 143 138  K 3.6 3.5 3.5 3.3* 2.9* 3.9  CL 104 100* 103 106 99* 97*  CO2 37* 38* 35* 35* 36* 36*  GLUCOSE 123* 124* 96 133* 116* 133*  BUN 32* 34* 22* 22* 22* 21*  CREATININE 0.69 0.71 0.64 0.70 0.71 0.66  CALCIUM 8.9 8.5* 8.3* 8.7* 8.4* 8.4*  MG 2.4  --   --   --  2.1  --    GFR: Estimated Creatinine Clearance: 71 mL/min (by C-G formula based on SCr of 0.66 mg/dL). Liver Function Tests: Recent Labs  Lab 06/13/17 1520 06/15/17 0634 06/16/17 0235  AST 25 25 19   ALT 33 40 34  ALKPHOS 58 59 63  BILITOT 0.9 0.8 0.5  PROT 5.5* 5.2* 4.9*  ALBUMIN 2.8* 2.6* 2.5*   No results for input(s):  LIPASE, AMYLASE in the last 168 hours. No results for input(s): AMMONIA in the last 168 hours. Coagulation Profile: Recent Labs  Lab 06/16/17 0235  INR 1.45   Cardiac Enzymes: No results for input(s): CKTOTAL, CKMB, CKMBINDEX, TROPONINI in the last 168 hours. BNP (last 3 results) Recent Labs    01/11/17 1053  PROBNP 264.0*   HbA1C: No results for input(s): HGBA1C in the last 72 hours. CBG: Recent Labs  Lab 06/19/17 1549 06/19/17 1936 06/19/17 2347 06/20/17 0330 06/20/17 0742  GLUCAP 196* 145* 109* 127* 106*   Lipid Profile: No results for input(s): CHOL, HDL, LDLCALC, TRIG, CHOLHDL, LDLDIRECT in the last 72 hours. Thyroid Function Tests: No results for input(s): TSH, T4TOTAL, FREET4, T3FREE, THYROIDAB in the last 72 hours. Anemia Panel: No results for input(s): VITAMINB12, FOLATE, FERRITIN, TIBC, IRON, RETICCTPCT in the last 72 hours. Urine analysis:    Component Value Date/Time   COLORURINE YELLOW  06/18/2017 1116   APPEARANCEUR CLEAR 06/18/2017 1116   LABSPEC 1.017 06/18/2017 1116   PHURINE 7.0 06/18/2017 1116   GLUCOSEU NEGATIVE 06/18/2017 1116   HGBUR NEGATIVE 06/18/2017 1116   [place] 06/18/2017 1116   BILIRUBINUR neg. 06/28/2013 1041   KETONESUR NEGATIVE 06/18/2017 1116   PROTEINUR NEGATIVE 06/18/2017 1116   UROBILINOGEN 0.2 06/28/2013 1041   UROBILINOGEN 0.2 07/30/2009 1435   NITRITE NEGATIVE 06/18/2017 1116   LEUKOCYTESUR NEGATIVE 06/18/2017 1116   Sepsis Labs: @LABRCNTIP (procalcitonin:4,lacticidven:4)  ) Recent Results (from the past 240 hour(s))  Culture, Urine     Status: None   Collection Time: 06/18/17 11:09 AM  Result Value Ref Range Status   Specimen Description URINE, CLEAN CATCH  Final   Special Requests NONE  Final   Culture NO GROWTH  Final   Report Status 06/19/2017 FINAL  Final         Radiology Studies: Ct Head Wo Contrast  Result Date: 06/18/2017 CLINICAL DATA:  Initial evaluation for acute change in mental status. EXAM: CT HEAD WITHOUT CONTRAST TECHNIQUE: Contiguous axial images were obtained from the base of the skull through the vertex without intravenous contrast. COMPARISON:  Prior MRI from 06/12/2017. FINDINGS: Brain: Generalized age related cerebral atrophy, stable. 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 No acute intracranial hemorrhage. No acute large vessel territory infarct. No mass lesion, midline shift, or mass effect. No hydrocephalus. No other extra-axial fluid collection. Vascular: No hyperdense vessel. Scattered vascular calcifications noted within the carotid siphons. Skull: Scalp soft tissues and calvarium within normal limits. Sinuses/Orbits: Globes and orbital soft tissues demonstrate no acute abnormality. Mild exophthalmos noted. Patient status post lens extraction bilaterally. Right sphenoid sinus disease again noted. Trace layering opacity noted  within the left sphenoid sinus. Paranasal sinuses otherwise largely clear. Nasogastric tube in place. Chronic bilateral mastoid effusions. Other: None. IMPRESSION: 1. Stable appearance of the brain. No acute intracranial process identified. 2. Right sphenoid sinus disease with chronic bilateral mastoid effusions, stable. Electronically Signed   By: Jeannine Boga M.D.   On: 06/18/2017 19:26        Scheduled Meds: . apixaban  10 mg Oral BID   Followed by  . [START ON 06/25/2017] apixaban  5 mg Oral BID  . chlorhexidine  15 mL Mouth Rinse BID  . clopidogrel  75 mg Oral Daily  . cyanocobalamin  1,000 mcg Subcutaneous Daily  . ezetimibe  10 mg Per Tube QHS  . feeding supplement (PRO-STAT SUGAR FREE 64)  30 mL Per Tube TID  .  folic acid  1 mg Per Tube Daily  . free water  375 mL Per Tube Q8H  . furosemide  40 mg Oral Daily  . hydrALAZINE  50 mg Per Tube Q8H  . insulin aspart  0-20 Units Subcutaneous Q4H  . insulin glargine  26 Units Subcutaneous Daily  . ipratropium-albuterol  3 mL Nebulization TID  . isosorbide dinitrate  20 mg Per Tube TID  . losartan  25 mg Oral Daily  . mouth rinse  15 mL Mouth Rinse q12n4p  . montelukast  10 mg Per Tube QHS  . pantoprazole sodium  40 mg Per Tube Daily  . pravastatin  40 mg Oral q1800  . predniSONE  10 mg Per Tube Q breakfast  . sodium chloride flush  10-40 mL Intracatheter Q12H  . sodium chloride flush  3 mL Intravenous Q12H  . spironolactone  25 mg Per Tube Daily   Continuous Infusions: . sodium chloride 250 mL (06/11/17 1400)  . sodium chloride    . feeding supplement (JEVITY 1.2 CAL) 1,000 mL (06/20/17 0622)     LOS: 25 days    Time spent: 25 minutes    Edwin Dada, MD [facility] Pager [phone]  If 7PM-7AM, please contact night-coverage [email or web address] Password [email or web address] 06/20/2017, 10:05 AM

## 2017-06-20 NOTE — Discharge Instructions (Addendum)
Information on my medicine - ELIQUIS (apixaban)  This medication education was reviewed with me or my healthcare representative as part of my discharge preparation.  The pharmacist that spoke with me during my hospital stay was:  Einar Grad, [facility]  Why was Eliquis prescribed for you? Eliquis was prescribed to treat blood clots that may have been found in the veins of your legs (deep vein thrombosis) or in your lungs (pulmonary embolism) and to reduce the risk of them occurring again.  What do You need to know about Eliquis ? The starting dose is 10 mg (two 5 mg tablets) taken TWICE daily for the FIRST SEVEN (7) DAYS, then on (enter date)  06/25/17  the dose is reduced to ONE 5 mg tablet taken TWICE daily.  Eliquis may be taken with or without food.   Try to take the dose about the same time in the morning and in the evening. If you have difficulty swallowing the tablet whole please discuss with your pharmacist how to take the medication safely.  Take Eliquis exactly as prescribed and DO NOT stop taking Eliquis without talking to the doctor who prescribed the medication.  Stopping may increase your risk of developing a new blood clot.  Refill your prescription before you run out.  After discharge, you should have regular check-up appointments with your healthcare provider that is prescribing your Eliquis.    What do you do if you miss a dose? If a dose of ELIQUIS is not taken at the scheduled time, take it as soon as possible on the same day and twice-daily administration should be resumed. The dose should not be doubled to make up for a missed dose.  Important Safety Information A possible side effect of Eliquis is bleeding. You should call your healthcare provider right away if you experience any of the following: ? Bleeding from an injury or your nose that does not stop. ? Unusual colored urine (red or dark brown) or unusual colored stools (red or black). ? Unusual bruising  for unknown reasons. ? A serious fall or if you hit your head (even if there is no bleeding).  Some medicines may interact with Eliquis and might increase your risk of bleeding or clotting while on Eliquis. To help avoid this, consult your healthcare provider or pharmacist prior to using any new prescription or non-prescription medications, including herbals, vitamins, non-steroidal anti-inflammatory drugs (NSAIDs) and supplements.  This website has more information on Eliquis (apixaban): http://[email or web address].com/eliquis/home   Additional discharge instructions:  Please get your medications reviewed and adjusted by your Primary MD.  Please request your Primary MD to go over all Hospital Tests and Procedure/Radiological results at the follow up, please get all Hospital records sent to your Prim MD by signing hospital release before you go home.  If you had Pneumonia of Lung problems at the Hospital: Please get a 2 view Chest X ray done in 6-8 weeks after hospital discharge or sooner if instructed by your Primary MD.  If you have Congestive Heart Failure: Please call your Cardiologist or Primary MD anytime you have any of the following symptoms:  1) 3 pound weight gain in 24 hours or 5 pounds in 1 week  2) shortness of breath, with or without a dry hacking cough  3) swelling in the hands, feet or stomach  4) if you have to sleep on extra pillows at night in order to breathe  Follow cardiac low salt diet and 1.5 lit/day fluid restriction.  If  you have diabetes Accuchecks 4 times/day, Once in AM empty stomach and then before each meal. Log in all results and show them to your primary doctor at your next visit. If any glucose reading is under 80 or above 300 call your primary MD immediately.  If you have Seizure/Convulsions/Epilepsy: Please do not drive, operate heavy machinery, participate in activities at heights or participate in high speed sports until you have seen by Primary MD or a  Neurologist and advised to do so again.  If you had Gastrointestinal Bleeding: Please ask your Primary MD to check a complete blood count within one week of discharge or at your next visit. Your endoscopic/colonoscopic biopsies that are pending at the time of discharge, will also need to followed by your Primary MD.  Get Medicines reviewed and adjusted. Please take all your medications with you for your next visit with your Primary MD  Please request your Primary MD to go over all hospital tests and procedure/radiological results at the follow up, please ask your Primary MD to get all Hospital records sent to his/her office.  If you experience worsening of your admission symptoms, develop shortness of breath, life threatening emergency, suicidal or homicidal thoughts you must seek medical attention immediately by calling 911 or calling your MD immediately  if symptoms less severe.  You must read complete instructions/literature along with all the possible adverse reactions/side effects for all the Medicines you take and that have been prescribed to you. Take any new Medicines after you have completely understood and accpet all the possible adverse reactions/side effects.   Do not drive or operate heavy machinery when taking Pain medications.   Do not take more than prescribed Pain, Sleep and Anxiety Medications  Special Instructions: If you have smoked or chewed Tobacco  in the last 2 yrs please stop smoking, stop any regular Alcohol  and or any Recreational drug use.  Wear Seat belts while driving.  Please note You were cared for by a hospitalist during your hospital stay. If you have any questions about your discharge medications or the care you received while you were in the hospital after you are discharged, you can call the unit and asked to speak with the hospitalist on call if the hospitalist that took care of you is not available. Once you are discharged, your primary care physician  will handle any further medical issues. Please note that NO REFILLS for any discharge medications will be authorized once you are discharged, as it is imperative that you return to your primary care physician (or establish a relationship with a primary care physician if you do not have one) for your aftercare needs so that they can reassess your need for medications and monitor your lab values.  You can reach the hospitalist office at phone [phone] or fax [phone]   If you do not have a primary care physician, you can call [phone] for a physician referral.

## 2017-06-20 NOTE — Progress Notes (Signed)
Chart reviewed. Spoke to pt, dtr and son. Pt having another good day: more alert, speaking some. VS and electrolytes WNL. Still on BIPAP intermittently. No delirium overnigh Palliative Team to continue to support holistically  Romona Curls, ANP

## 2017-06-20 NOTE — Progress Notes (Signed)
Placed pt back on BIPAP due to decreasing sats. RT will continue to monitor.

## 2017-06-21 DIAGNOSIS — R0902 Hypoxemia: Secondary | ICD-10-CM

## 2017-06-21 DIAGNOSIS — J8489 Other specified interstitial pulmonary diseases: Secondary | ICD-10-CM

## 2017-06-21 DIAGNOSIS — J690 Pneumonitis due to inhalation of food and vomit: Secondary | ICD-10-CM

## 2017-06-21 LAB — GLUCOSE, CAPILLARY
GLUCOSE-CAPILLARY: 101 mg/dL — AB (ref 65–99)
GLUCOSE-CAPILLARY: 112 mg/dL — AB (ref 65–99)
GLUCOSE-CAPILLARY: 116 mg/dL — AB (ref 65–99)
GLUCOSE-CAPILLARY: 128 mg/dL — AB (ref 65–99)
Glucose-Capillary: 126 mg/dL — ABNORMAL HIGH (ref 65–99)
Glucose-Capillary: 148 mg/dL — ABNORMAL HIGH (ref 65–99)

## 2017-06-21 LAB — BASIC METABOLIC PANEL
ANION GAP: 5 (ref 5–15)
BUN: 25 mg/dL — ABNORMAL HIGH (ref 6–20)
CO2: 39 mmol/L — AB (ref 22–32)
Calcium: 8.5 mg/dL — ABNORMAL LOW (ref 8.9–10.3)
Chloride: 96 mmol/L — ABNORMAL LOW (ref 101–111)
Creatinine, Ser: 0.71 mg/dL (ref 0.44–1.00)
GFR calc Af Amer: >60 mL/min (ref >60)
GLUCOSE: 113 mg/dL — AB (ref 65–99)
POTASSIUM: 4 mmol/L (ref 3.5–5.1)
Sodium: 140 mmol/L (ref 135–145)

## 2017-06-21 MED ORDER — HYDRALAZINE HCL 25 MG PO TABS
25.0000 mg | ORAL_TABLET | Freq: Three times a day (TID) | ORAL | Status: DC
  Administered 2017-06-21 – 2017-06-22 (×3): 25 mg
  Filled 2017-06-21 (×3): qty 1

## 2017-06-21 MED ORDER — ALBUTEROL SULFATE (2.5 MG/3ML) 0.083% IN NEBU: 2.5000 mg | INHALATION_SOLUTION | Freq: Four times a day (QID) | RESPIRATORY_TRACT | Status: DC | PRN

## 2017-06-21 MED ORDER — ISOSORBIDE DINITRATE 10 MG PO TABS
10.0000 mg | ORAL_TABLET | Freq: Three times a day (TID) | ORAL | Status: DC
  Administered 2017-06-21 – 2017-06-22 (×4): 10 mg
  Filled 2017-06-21 (×6): qty 1

## 2017-06-21 MED ORDER — BISOPROLOL FUMARATE 5 MG PO TABS
2.5000 mg | ORAL_TABLET | Freq: Every day | ORAL | Status: DC
  Administered 2017-06-21 – 2017-06-23 (×3): 2.5 mg via ORAL
  Filled 2017-06-21: qty 0.5
  Filled 2017-06-21: qty 1
  Filled 2017-06-21: qty 0.5

## 2017-06-21 NOTE — Progress Notes (Signed)
  Speech Language Pathology Treatment: Dysphagia  Patient Details Name: Kelli Velasquez MRN: [id number] DOB: [birth year] Today's Date: 06/21/2017 Time: 8115-7262 SLP Time Calculation (min) (ACUTE ONLY): 31 min  Assessment / Plan / Recommendation Clinical Impression  Impressions based on prior ST notes, family report and SLP observations. Oral cavity today without secretions on hard/soft palate. Mild lingual candidias- on high risk protocol with chlorehexidine rinse. Vocal quality continues to be breathy with decreased intensity but improved per notes and husband/dtr report. Oral cavity and dentures cleaned and donned (first time since admission) with ice chip trials. Delayed mild throat clearing present on approximately 40% of trials; followed commands for weak cough and throat clear which cleared wet vocal quality less than 50% of the time. Pt is alert, more conversive and aware of surroundings. Proceeding with caution as pt with suspected pna following initiation of po's after initial FEES, requiring reintubation. SLP recommends continue oral care and ice chip trials, doff and clean dentures at night. Will check and recommend repeat FEES when ready and appropriate (?possibly tomorrow?).   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 She had significant respiratory distress in ER and failed trial bipap requiring intubation 10/31-11/4. FEES 11/6 showed severe edema and weakness, standing secretions, and diffuse residue that could not be cleared. Recommendations at that time were to remain NPO except for a few ice chips given after oral care. Pt was reintubated 11/7-11/12 and stayed on BiPAP post-extubation. SLP was consulted to reassess swallowing.      SLP Plan  Continue with current plan of care       Recommendations  Diet recommendations:  Other(comment);NPO(ice chips after oral care) Medication Administration: Via alternative means                Oral Care Recommendations: Oral care prior to ice chip/H20;Oral care QID Follow up Recommendations: Skilled Nursing facility SLP Visit Diagnosis: Dysphagia, oropharyngeal phase (R13.12) Plan: Continue with current plan of care       GO                Houston Siren 06/21/2017, 10:20 AM  Orbie Pyo Colvin Caroli.Ed [employer] [phone]

## 2017-06-21 NOTE — Progress Notes (Signed)
PULMONARY / CRITICAL CARE MEDICINE   Name: Kelli Velasquez MRN: [id number] DOB: [birth year]    ADMISSION DATE:  05/26/2017  REFERRING MD:  Dr. Wyvonnia Dusky  CHIEF COMPLAINT:  SOB  HISTORY OF PRESENT ILLNESS:   76 yo female former smoker presented with progressive dyspnea, cough, wheeze, and edema.  Required intubation for respiratory failure from CHF, A fib and MSSA PNA. PMHx of CAD, HTN, combined CHF, and ? 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 Melvyn Novas).  SUBJECTIVE:  Feeling much better today. On 2L O2 and BiPAP at night. O2 is chronic BiPAP is new.   VITAL SIGNS: BP (!) 131/55 (BP Location: Left Leg)   Pulse 73   Temp (!) 97.5 F (36.4 C) (Oral)   Resp (!) 24   Ht _0  (1.626 m)   Wt 107.5 kg (237 lb)   LMP 07/27/1980   SpO2 99%   BMI 40.68 kg/m   INTAKE / OUTPUT:    Intake/Output Summary (Last 24 hours) at 06/21/2017 1515 Last data filed at 06/21/2017 1236 Gross per 24 hour  Intake 2340 ml  Output 1200 ml  Net 1140 ml     PHYSICAL EXAMINATION: General:  Morbidly obese elderly female in NAD Neuro:  Alert, oriented, non-focal. Weak HEENT:  [place]/AT, PERRL, unable to appreciate JVD Cardiovascular:  IRIR, no MRG Lungs:  Distant diminished Abdomen:  Soft, non-distended, non-tender.  Musculoskeletal:  No acute deformity or ROM limitation Skin:  Intact, MMM    LABS:  BMET Recent Labs  Lab 06/19/17 0330 06/20/17 0427 06/21/17 0332  NA 143 138 140  K 2.9* 3.9 4.0  CL 99* 97* 96*  CO2 36* 36* 39*  BUN 22* 21* 25*  CREATININE 0.71 0.66 0.71  GLUCOSE 116* 133* 113*    Electrolytes Recent Labs  Lab 06/15/17 0634  06/19/17 0330 06/20/17 0427 06/21/17 0332  CALCIUM 8.9   < > 8.4* 8.4* 8.5*  MG 2.4  --  2.1  --   --    < > = values in this interval not displayed.    CBC Recent Labs  Lab 06/18/17 0529 06/19/17 0330 06/20/17 0427  WBC 12.5* 11.2* 11.3*  HGB 11.1* 9.7* 9.5*  HCT 37.3 32.2* 32.0*  PLT 124* 103* 109*    Liver  Enzymes Recent Labs  Lab 06/15/17 0634 06/16/17 0235  AST 25 19  ALT 40 34  ALKPHOS 59 63  BILITOT 0.8 0.5  ALBUMIN 2.6* 2.5*    Glucose Recent Labs  Lab 06/20/17 1639 06/20/17 2027 06/21/17 0004 06/21/17 0342 06/21/17 0744 06/21/17 1126  GLUCAP 171* 125* 101* 116* 126* 148*    Imaging No results found.  STUDIES:  Echo 10/31>>> EF 35-40%, diffuse hypokinesis CT chest 11/7 read pending > RLL consolidation, no appreciable effusion    CULTURES: BAL AFB 11/07 >> neg smear >>> BAL PCP 11/07 >> negative BAL fungal 11/07 >>  BAL 11/07 >> oral flora Sputum 11/07 >> MSSA Blood 11/07 >> negative  ANTIBIOTICS: Tressie Ellis 11/05 >> 11/10 Vancomycin 11/05 >> 11/07 Rocephin 11/10 >> 11/16  SIGNIFICANT EVENTS: 10/31 Admit 11/04 extubate 11/05 transfer SDU 11/07 transfer ICU reintubated. 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  ASSESSMENT / PLAN:  Acute hypoxic respiratory failure/ multifactorial -  oxygen at home 2L - Continue nebs as scheduled - Mobilize, pulmonary toilette as able  Presumed sleep disordered breathing/ hypercarbia related to OHS - Will try off BiPAP tonight and check ABG in AM  MSSA PNA. - Completed ABX 11/16 - No evidence of recurrent infrection    Hx of BOOP. -  Continue prednisone 31m daily with eventual titration to floor of 539m    Acute on chronic combined CHF with likely ischemia resulting in flash pulmonary edema A fib with RVR. CAD, HLD.  - per cardiology - apixaban per primary  DVT prophylaxis - SCDs, DOAC SUP - protonix Nutrition - tube feeds Goals of care - DNR/DNI  PaGeorgann HousekeeperAGACNP-BC [place] [facility] Pager [phone]/26/2018 3:20 PM  Attending Note:  [age]ear old female with BOOP history on steroids chronically at 5 mg that seems to be becoming more hypercarbic due to OHS.  Patient has been on BiPAP at night but was not on it at home and she hates it.  On exam, decreased BS at the bases.  I reviewed CXR myself, infiltrate noted.  Discussed with PCCM-NP.  Acute on chronic hypoxemic respiratory failure:  - D/C BiPAP  - Titrate O2 for sat of 88-92%  AM hypercarbic failure  - ABG in AM after d/c of BiPAP  BOOP  - Continue prednisone at 10 mg PO daily for now and can taper as outpatient  Heart failure:  - Diureses as able  PCCM will follow.  Patient seen and examined, agree with above note.  I dictated the care and orders written for this patient under my direction.  YaRush FarmerMD[place]

## 2017-06-21 NOTE — Progress Notes (Signed)
Occupational Therapy Treatment Patient Details Name: KENNETHA PEARMAN MRN: [id number] DOB: 10-13-1940 Today's Date: 06/21/2017    History of present illness 76 yo female admitted for progressive respiratory distress.  She then developed altered mental status.  Pt has a past medical history including Allergy, Asthma, Chronic bronchitis, CAD, Dyspnea, GERD, Hyperlipidemia, Hypertension, Myocardial infarction, Myopia, Neoplasm of skin, Nocturnal oxygen desaturation, Obesity, Osteoarthritis, Oxygen deficiency,  Urinary incontinence, and Vertigo. Required intubation 10/31-11/4 and  11/6-11/14, now off Bipap.   OT comments  Pt demonstrating progress toward OT goals. She was able to complete toilet transfers with mod assist +2 and total assistance for toileting hygiene. Pt was able to don socks without assistance but requires max assist to complete LB dressing tasks in standing to pull up pants. Pt hyperfocused on transfer to chair with minimal safety awareness this session and noted impulsivity throughout. D/C recommendation remains appropriate. OT to re-assess and update goals as necessary next session.    Follow Up Recommendations  SNF;Supervision/Assistance - 24 hour    Equipment Recommendations  Other (comment)(defer to next venue of care)    Recommendations for Other Services      Precautions / Restrictions Precautions Precautions: Fall Precaution Comments: watch O2 saturations and HR Restrictions Weight Bearing Restrictions: No       Mobility Bed Mobility Overal bed mobility: Needs Assistance Bed Mobility: Supine to Sit Rolling: Supervision   Supine to sit: Mod assist;HOB elevated     General bed mobility comments: VC's for sequencing. Assist to elevate trunk and scoot to EOB.   Transfers Overall transfer level: Needs assistance Equipment used: Rolling walker (2 wheeled) Transfers: Sit to/from [facility] Sit to Stand: Mod assist;+2 physical assistance Stand  pivot transfers: Min assist;+2 physical assistance       General transfer comment: Pt requiring mod assist +2 for power up to standing. Min assist for balance standing at EOB and during pivot to chair.     Balance Overall balance assessment: Needs assistance Sitting-balance support: Bilateral upper extremity supported;Feet supported;No upper extremity supported Sitting balance-Leahy Scale: Fair Sitting balance - Comments: UE support for comfort   Standing balance support: During functional activity;Bilateral upper extremity supported Standing balance-Leahy Scale: Poor Standing balance comment: relies on UE support and external support.                            ADL either performed or assessed with clinical judgement   ADL Overall ADL's : Needs assistance/impaired                     Lower Body Dressing: Maximal assistance;Sit to/from stand Lower Body Dressing Details (indicate cue type and reason): Able to don socks but requiring max assistance in standing  Toilet Transfer: Moderate assistance;+2 for physical assistance;Stand-pivot;RW;BSC Toilet Transfer Details (indicate cue type and reason): Simulated from bed to chair Toileting- Clothing Manipulation and Hygiene: Total assistance;Sit to/from stand       Functional mobility during ADLs: Minimal assistance;Moderate assistance;+2 for physical assistance General ADL Comments: Pt requiring mod assist +2 for rise to standing and min assist +2 for pivot to chair. Required max encouragement and VC's to complete task safely as pt hyperfocused on getting to chair.      Vision       Perception     Praxis      Cognition Arousal/Alertness: Awake/alert Behavior During Therapy: WFL for tasks assessed/performed Overall Cognitive Status: Impaired/Different from baseline Area of  Impairment: Attention;Memory;Following commands;Safety/judgement;Awareness                   Current Attention Level:  Sustained Memory: Decreased recall of precautions;Decreased short-term memory Following Commands: Follows one step commands inconsistently Safety/Judgement: Decreased awareness of safety;Decreased awareness of deficits Awareness: Intellectual   General Comments: Poor safety awareness and very impulsive. Hyperfocused on getting OOB to chair despite redirection and max education.         Exercises General Exercises - Lower Extremity Hip Flexion/Marching: AROM;Both;10 reps;Seated   Shoulder Instructions       General Comments Family present at end of session. VSS throughout.     Pertinent Vitals/ Pain       Pain Assessment: No/denies pain  Home Living                                          Prior Functioning/Environment              Frequency  Min 2X/week        Progress Toward Goals  OT Goals(current goals can now be found in the care plan section)  Progress towards OT goals: (some goals met and updated)  Acute Rehab OT Goals Patient Stated Goal: get OOB  OT Goal Formulation: With patient/family Time For Goal Achievement: 07/08/17 Potential to Achieve Goals: [place] Discharge plan remains appropriate    Co-evaluation    PT/OT/SLP Co-Evaluation/Treatment: Yes Reason for Co-Treatment: Complexity of the patient's impairments (multi-system involvement);Necessary to address cognition/behavior during functional activity;To address functional/ADL transfers PT goals addressed during session: Mobility/safety with mobility;Balance;Proper use of DME;Strengthening/ROM OT goals addressed during session: ADL's and self-care;Proper use of Adaptive equipment and DME;Strengthening/ROM      AM-PAC PT "6 Clicks" Daily Activity     Outcome Measure   Help from another person eating meals?: Total Help from another person taking care of personal grooming?: A Little Help from another person toileting, which includes using toliet, bedpan, or urinal?:  Total Help from another person bathing (including washing, rinsing, drying)?: A Lot Help from another person to put on and taking off regular upper body clothing?: A Little Help from another person to put on and taking off regular lower body clothing?: A Lot 6 Click Score: 12    End of Session Equipment Utilized During Treatment: Oxygen  OT Visit Diagnosis: Muscle weakness (generalized) (M62.81);Unsteadiness on feet (R26.81);Other abnormalities of gait and mobility (R26.89);History of falling (Z91.81)   Activity Tolerance Patient tolerated treatment well   Patient Left in chair;with call bell/phone within reach;with chair alarm set;with family/visitor present   Nurse Communication Mobility status        Time: 3149-7026 OT Time Calculation (min): 35 min  Charges: OT General Charges $OT Visit: 1 Visit OT Treatments $Self Care/Home Management : 8-22 mins  Norman Herrlich, MS OTR/L  Pager: [place] A Erna Brossard 06/21/2017, 3:46 PM

## 2017-06-21 NOTE — Progress Notes (Signed)
Physical Therapy Treatment Patient Details Name: Kelli Velasquez MRN: [id number] DOB: 1940/08/22 Today's Date: 06/21/2017    History of Present Illness 76 yo female admitted for progressive respiratory distress.  She then developed altered mental status.  Pt has a past medical history including Allergy, Asthma, Chronic bronchitis, CAD, Dyspnea, GERD, Hyperlipidemia, Hypertension, Myocardial infarction, Myopia, Neoplasm of skin, Nocturnal oxygen desaturation, Obesity, Osteoarthritis, Oxygen deficiency,  Urinary incontinence, and Vertigo. Required intubation 10/31-11/4 and  11/6-11/14, now off Bipap.    PT Comments    Pt in bed with husband present upon arrival. Agreeable to work with PT. OT joined session 10 minutes in. Pt wanted to sit up in chair. Stated she had a BM. Pt was able to roll over in bed with no physical assistance but required VCs for sequencing. Mod assist +2 for trunk elevation from bed. Able to maintain balance sitting EOB. Pt participated in two standing trials while receiving pericare. First trial stood for 4 minutes with UE support. Second trial stood for two minutes with UE support. Pt very impulsive. She stated she was going down and sat without checking to see if the chair/bed was behind her. She was focused on getting in the chair and disregarded directions from PT and OT because of tunnel vision. Pts VSs were stable throughout session. Pt would benefit from skilled PT to increase endurance and functional mobility.  Follow Up Recommendations  SNF;Supervision/Assistance - 24 hour     Equipment Recommendations  None recommended by PT;Other (comment)    Recommendations for Other Services       Precautions / Restrictions Precautions Precautions: Fall Precaution Comments: watch O2 saturations and HR Restrictions Weight Bearing Restrictions: No    Mobility  Bed Mobility Overal bed mobility: Needs Assistance   Rolling: Supervision   Supine to sit: +2 for physical  assistance;Mod assist;HOB elevated     General bed mobility comments: VCs for sequencing and to maintain attention to task; Mod assist +2 to elevate trunk and scoot EOB.  Transfers Overall transfer level: Needs assistance Equipment used: Rolling walker (2 wheeled) Transfers: Sit to/from [facility] Sit to Stand: Min assist;+2 physical assistance Stand pivot transfers: Min assist;+2 physical assistance       General transfer comment: Pt able to stand with min assist for power up and balance. Pt had BM while in bed. Multiple sit<>stands to complete pericare. Multiple VCs for hand placement on walker. Pt very impulsive; she wanted to sit in recliner and was unaware of safety issues while attempting.   Ambulation/Gait Ambulation/Gait assistance: +2 physical assistance;Mod assist;+2 safety/equipment Ambulation Distance (Feet): 3 Feet Assistive device: Rolling walker (2 wheeled) Gait Pattern/deviations: Step-to pattern;Decreased stride length     General Gait Details: Pt required cues for hand placement on walker and posture. Pt said she was "going down" and flopped back into chair without warning and without feeling to see if it was behind her.   Stairs            Wheelchair Mobility    Modified Rankin (Stroke Patients Only)       Balance Overall balance assessment: Needs assistance Sitting-balance support: Bilateral upper extremity supported;Feet supported Sitting balance-Leahy Scale: Fair Sitting balance - Comments: No problems sitting EOB today.    Standing balance support: During functional activity;Bilateral upper extremity supported Standing balance-Leahy Scale: Poor Standing balance comment: relies on UE support and external support.  Cognition Arousal/Alertness: Awake/alert Behavior During Therapy: WFL for tasks assessed/performed Overall Cognitive Status: Difficult to assess                                  General Comments: Pt perseverated on getting to chair at expense of safety.      Exercises General Exercises - Lower Extremity Hip Flexion/Marching: AROM;Both;10 reps;Seated    General Comments        Pertinent Vitals/Pain Pain Assessment: No/denies pain    Home Living                      Prior Function            PT Goals (current goals can now be found in the care plan section) Acute Rehab PT Goals Patient Stated Goal: non discussed PT Goal Formulation: With patient Time For Goal Achievement: 06/28/17 Potential to Achieve Goals: Fair    Frequency           PT Plan Current plan remains appropriate    Co-evaluation PT/OT/SLP Co-Evaluation/Treatment: Yes Reason for Co-Treatment: Complexity of the patient's impairments (multi-system involvement);For patient/therapist safety;To address functional/ADL transfers PT goals addressed during session: Mobility/safety with mobility;Balance;Proper use of DME;Strengthening/ROM OT goals addressed during session: ADL's and self-care;Other (comment)      AM-PAC PT "6 Clicks" Daily Activity  Outcome Measure  Difficulty turning over in bed (including adjusting bedclothes, sheets and blankets)?: Unable Difficulty moving from lying on back to sitting on the side of the bed? : Unable Difficulty sitting down on and standing up from a chair with arms (e.g., wheelchair, bedside commode, etc,.)?: Unable Help needed moving to and from a bed to chair (including a wheelchair)?: A Lot Help needed walking in hospital room?: Total Help needed climbing 3-5 steps with a railing? : Total 6 Click Score: 7    End of Session Equipment Utilized During Treatment: Oxygen;Gait belt Activity Tolerance: Patient limited by fatigue Patient left: with call bell/phone within reach;with family/visitor present;in chair;with chair alarm set Nurse Communication: Mobility status PT Visit Diagnosis: Unsteadiness on feet  (R26.81);Difficulty in walking, not elsewhere classified (R26.2)     Time: 3419-3790 PT Time Calculation (min) (ACUTE ONLY): 45 min  Charges:  $Therapeutic Activity: 23-37 mins                    G Codes:       Janna Arch, SPTA   Janna Arch 06/21/2017, 2:42 PM

## 2017-06-21 NOTE — Progress Notes (Signed)
Patient ID: Kelli Velasquez, female   DOB: 20-Sep-1940, 76 y.o.   MRN: [id number]      Advanced Heart Failure Rounding Note  Subjective:    Over 2 weeks prior to admission, she had increased dyspnea with exertion, she was short of breath at rest on the day of admission.  Her husband says she has been taking 20 mg of lasix daily and occasionally an extra 20 mg of lasix.   Presented to [facility] ED with respiratory distress. Family reported increased dyspnea over the week prior to admission. In ED she received solumedrol and duonebs. Placed on Bipap but continue decline requiring intubation. CXR concerning for interstitial edema and bilateral effusions.  Extubated 11/4. Noted to have swelling of upper airways on speech/swallow evaluation 11/6.  Developed respiratory distress 11/6 in the evening with hypoxia and hypercapnia and was re-intubated.    Extubated 06/07/17.   CXR 06/09/17 with LLL PNA.  She has completed abx for MSSA PNA.   UE [place] 06/14/17 with non-occlusive DVT in right axillary vein. Noted around PICC line in right basilic vein, and short segment of SVT noted in the forearm within the cephalic vein. LUE negative  BLE [place] 06/14/17 negative for DVTs.  LHC (11/21) with DES to RCA (95% mid RCA stenosis).   Intermittent confusion over weekend, this morning alert/oriented, conversant.  Denies dyspnea or chest pain.  On 4L oxygen by [place].   Cardiac Studies: Echo 05/26/2017 EF 35-40%  Grade I DD RV normal Echo 2017 EF 45-50% with WMA Myoview 2017 No significant ischemia EF 48%.  [place] 2011- DES LAD and RCA.   Objective:   Weight Range: 237 lb (107.5 kg) Body mass index is 40.68 kg/m.   Vital Signs:   Temp:  [98.4 F (36.9 C)-99 F (37.2 C)] 98.5 F (36.9 C) (11/26 0326) Pulse Rate:  [76-84] 79 (11/26 0326) Resp:  [16-29] 25 (11/26 0326) BP: (107-133)/(39-65) 133/40 (11/26 0525) SpO2:  [94 %-100 %] 96 % (11/25 2301) FiO2 (%):  [40 %] 40 % (11/25 1941) Weight:  [237 lb (107.5 kg)] 237  lb (107.5 kg) (11/26 0326) Last BM Date: 06/19/17  Weight change: Filed Weights   06/19/17 0243 06/20/17 0329 06/21/17 0326  Weight: 242 lb (109.8 kg) 240 lb (108.9 kg) 237 lb (107.5 kg)   Intake/Output:   Intake/Output Summary (Last 24 hours) at 06/21/2017 0741 Last data filed at 06/21/2017 0600 Gross per 24 hour  Intake 2889.83 ml  Output 1300 ml  Net 1589.83 ml    Physical Exam   General: NAD Neck: Thick, JVP difficult, no thyromegaly or thyroid nodule.  Lungs: Clear to auscultation bilaterally with normal respiratory effort. CV: Nondisplaced PMI.  Heart regular S1/S2, no S3/S4, no murmur.  No peripheral edema.   Abdomen: Soft, nontender, no hepatosplenomegaly, no distention.  Skin: Intact without lesions or rashes.  Neurologic: Alert and oriented x 3.  Psych: Normal affect. Extremities: No clubbing or cyanosis.  HEENT: Normal.    Telemetry   NSR 80s with PACs, Personally reviewed.   Labs    CBC Recent Labs    06/19/17 0330 06/20/17 0427  WBC 11.2* 11.3*  NEUTROABS 8.7* 8.6*  HGB 9.7* 9.5*  HCT 32.2* 32.0*  MCV 99.7 100.3*  PLT 103* 469*   Basic Metabolic Panel Recent Labs    06/19/17 0330 06/20/17 0427 06/21/17 0332  NA 143 138 140  K 2.9* 3.9 4.0  CL 99* 97* 96*  CO2 36* 36* 39*  GLUCOSE 116* 133* 113*  BUN 22* 21* 25*  CREATININE 0.71 0.66 0.71  CALCIUM 8.4* 8.4* 8.5*  MG 2.1  --   --    Liver Function Tests No results for input(s): AST, ALT, ALKPHOS, BILITOT, PROT, ALBUMIN in the last 72 hours. No results for input(s): LIPASE, AMYLASE in the last 72 hours. Cardiac Enzymes No results for input(s): CKTOTAL, CKMB, CKMBINDEX, TROPONINI in the last 72 hours. BNP: BNP (last 3 results) Recent Labs    05/26/17 0610  BNP 760.9*   ProBNP (last 3 results) Recent Labs    01/11/17 1053  PROBNP 264.0*   D-Dimer No results for input(s): DDIMER in the last 72 hours. Hemoglobin A1C No results for input(s): HGBA1C in the last 72  hours. Fasting Lipid Panel No results for input(s): CHOL, HDL, LDLCALC, TRIG, CHOLHDL, LDLDIRECT in the last 72 hours. Thyroid Function Tests No results for input(s): TSH, T4TOTAL, T3FREE, THYROIDAB in the last 72 hours.  Invalid input(s): FREET3  Other results:  Imaging   No results found.  Medications:     Scheduled Medications: . apixaban  10 mg Oral BID   Followed by  . [START ON 06/25/2017] apixaban  5 mg Oral BID  . bisoprolol  2.5 mg Oral Daily  . chlorhexidine  15 mL Mouth Rinse BID  . clopidogrel  75 mg Oral Daily  . cyanocobalamin  1,000 mcg Subcutaneous Daily  . ezetimibe  10 mg Per Tube QHS  . feeding supplement (PRO-STAT SUGAR FREE 64)  30 mL Per Tube TID  . folic acid  1 mg Per Tube Daily  . free water  375 mL Per Tube Q8H  . furosemide  40 mg Oral Daily  . hydrALAZINE  25 mg Per Tube Q8H  . insulin aspart  0-20 Units Subcutaneous Q4H  . insulin glargine  26 Units Subcutaneous Daily  . ipratropium-albuterol  3 mL Nebulization TID  . isosorbide dinitrate  10 mg Per Tube TID  . losartan  25 mg Oral Daily  . mouth rinse  15 mL Mouth Rinse q12n4p  . montelukast  10 mg Per Tube QHS  . pantoprazole sodium  40 mg Per Tube Daily  . pravastatin  40 mg Oral q1800  . predniSONE  10 mg Per Tube Q breakfast  . sodium chloride flush  10-40 mL Intracatheter Q12H  . sodium chloride flush  3 mL Intravenous Q12H  . spironolactone  25 mg Per Tube Daily    Infusions: . sodium chloride 250 mL (06/11/17 1400)  . sodium chloride    . 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  Admitted with acute/chronic respiratory failure  Assessment/Plan   1. Acute/chronic  systolic CHF: Ischemic cardiomyopathy given prior history.  Echo this admission with EF down to 35-40% range, around 50% in past.  She was admitted with worsening dyspnea, elevated BNP, CXR with pulmonary edema => LHC 11/21 showed tight RCA stenosis, so may have been ischemia-mediated flash pulmonary edema.  She has diuresed while here, now on po Lasix. Volume status difficult but does not seem overloaded.  - Continue lasix 40 mg daily.   - BP has been running lower, decrease hydralazine to 25 mg tid and isordil to 10  mg tid.  Continue losartan 25 mg daily.  - Continue spironolactone 25 mg daily.  - Would keep her on bisoprolol 2.5 mg daily.  If HR dips to 40s while asleep and BP stable, not concerning.  2. Acute hypoxemic respiratory failure: Initially thought multifactorial due to pulmonary edema, effusions, BOOP and OHS.  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  She was diuresed.  At baseline, she is on 2L home oxygen.  Extubated 11/4 but developed respiratory distress 11/6 and re-intubated.  She had swelling of the upper airways noted by speech/swallow exam.  Found to have MSSA PNA.  She has now completed antibiotic course.   - Improved. On 4L O2 by [place], using Bipap at night.  3. CAD: h/o DES to LAD and RCA in 2011.  No chest pain but admitted with severe dyspnea/volume overload and fall in EF on echo.  Troponin mildly elevated but no trend. Cath 11/21 with severe mid RCA stenosis, treated with DES.  Suspect this may have triggered ischemic flash pulmonary edema.  - She got Brilinta with cath, will transition today to Plavix with 300 mg po load (as she will need ongoing anticoagulation for RUE DVT).  Not on ASA given anticoagulation need.  - She has not tolerated Zocor or Crestor.  Will try her on low dose pravastatin 40 mg daily.  4.  AKI: Resolved. 5. HTN: BP running lower.  As above, decreasing hydralazine/Imdur.  6. Code status: DNR/DNI at this time. No change.  7. NSVT: No recurrence. Keep on  bisoprolol at low dose. . 8. DVT: RUE, triggered by PICC. Started on bivalirudin 06/14/17 on hematology recommendation (platelets had fallen but HIT negative by SRA).   - PICC has been removed.  - Given triggered RUE DVT, would favor 3 months anticoagulation with DOAC => transitioned from bivalirudin to Eliquis with DVT dosing today.  9. Delirium: Improved today.    Length of Stay: [place], MD  06/21/2017 7:41 AM  Advanced Heart Failure Team Pager [phone] (M-F; 7a - 4p)  Please contact [facility] for night-coverage after hours (4p -7a ) and weekends on [email or web address]

## 2017-06-21 NOTE — Progress Notes (Signed)
PROGRESS NOTE    Kelli Velasquez  KXF:[id number] DOB: 04-Sep-1940 DOA: 05/26/2017 PCP: Abner Greenspan, MD      Brief Narrative:  76 yo F with CHF, A. fib, CAD, HTN, BOOP admitted on 10/31 with SOB, was found to have MSSA pneumonia with acute respiratory failure --> intubation and extubated on 05/30/2017.  Later patient had another episode of respiratory distress requiring repeat intubation on 06/02/2017.  Currently extubated on 06/07/2017 remains on stepdown unit.  After repeat extubation patient remains aphonic. Bronchoscopy performed on 06/02/2017, cultures so far are negative.  Sputum culture is growing MSSA patient completed antibiotic course on 06/11/2017. Also found to have acute systolic CHF, cardiology consulted and currently on IV Lasix recommend inpatient cardiac cath.  Currently further plan is continue monitoring for improvement in voice quality as well as further cardiac workup.  Cath 11/21 showed single vessel disease, stented.          Assessment & Plan:  Principal Problem:   Acute respiratory failure with hypercapnia (HCC) Active Problems:   Hyperlipidemia   Severe obesity (BMI >= 40) (HCC)   Essential hypertension   CAD, NATIVE VESSEL   OVERACTIVE BLADDER   Pulmonary nodules c/w Nodular BOOP   Acute on chronic systolic congestive heart failure (HCC)   COPD exacerbation (HCC)   Unstable angina ([place])   Palliative care encounter   1. Acute hypoxic respiratory failure from CHF, coronary ischemia Patient initially presented with respiratory failure, required intubation, treated with aggressive diuresis for CHF and Solu-medrol for BOOP.  Extubated after 5 days, but failed after 4 days and was reintubated from 11/7-11/12.  During second intubation developed MSSA pneumonia, now resolved.  Now off antibiotics, diuretics scaled back and back to home level O2, home steroids from Pulmonary standpoint. This is mostly now resolved. -Continue home BiPAP nightly -Continue Lasix  daily -Monitor I/Os closely    2. Delirium: From hyperNa, hypercarbia, hypoxia, and ICU delirium.  -Consult Palliative Care, for help coordinating discharge planning, overall goals of care, appreciate input Delirium precautions:   -Lights and TV off, minimize interruptions at night  -Blinds open and lights on during day  -Glasses/hearing aid with patient  -Frequent reorientation  -PT/OT when able  -Avoid sedation medications/Beers list medications  3. Dysphagia and aphonia Family have indicated PEG is not within patient's goals of care.  This seems to be progressing per SLP. -Patient is NPO, but may have ice chips, hopefully can continue to advance diet -Maintain Cortrak for now -SLP consult appreciated  4. Bradycardia and hypotension These are intermittnet but not concurrent.  Bradycardia is asymptomatic, while sleeping.  -Conservative hold parameters on Isordil and Losartan -Doses of hydralazine, Isordil, Losartan and BB have been reduced  5.  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 fib with RVR, and CAD.  Echo showed EF 35%. Cath 11/21 showed single vessel disease, stented.   -Consult cardiology, appreciate recommendations -Continue home oral Lasix -Continue Plavix -Continue Isordil, Spironolactone per Cardiology -Continue BB, hydralazine  6. History of BOOP -Continue prednisone back to baseline   7. MSSA pneumonia: Completed antibiotics  8. Diabetes -Continue SSI  9. Severe protein calorie malnutrition -Continue tube feeds per NG  10. DVT: UE DVT in setting of PICC.  Thrombocytopenia with negative HIT panel, avoiding heparins. -Continue Eliquis  11. Hypernatremia Mild.   -Free water flushes -Daily BMP  12. Macrocytic anemia Mild  13. Hypokalemia Mag nl. -Supplement  DVT prophylaxis: Eliquis Code Status: DNR Family Communication: None present Disposition Plan: Prolonged  delirium, delaying ability to swallow.  Continue to work with SLP, PT.  Palliative care involved for help with transition of care, further goals.    Likely inpatient rehab in next few days, if stable vs SNF.   Consultants:   Cardiology  CCM  Palliative  Procedures:   Echocardiogram  Bronchoscopy  Intubation  Antimicrobials:   Ceftazidime 11/6 >> 11/10  Ceftriaxone 11/10 >> 11/16    Subjective: Awake, responsive, sitting in chair, oriented to place.  No new fever, pain, chest pain, dyspnea, confusion.    Objective: Vitals:   06/21/17 0525 06/21/17 0742 06/21/17 0804 06/21/17 1127  BP: (!) 133/40 (!) 127/44  (!) 114/59  Pulse:  81  75  Resp:  (!) 21  (!) 26  Temp:  98.9 F (37.2 C)  99.3 F (37.4 C)  TempSrc:  Oral  Oral  SpO2:  100% 100% 99%  Weight:      Height:        Intake/Output Summary (Last 24 hours) at 06/21/2017 1231 Last data filed at 06/21/2017 0600 Gross per 24 hour  Intake 2480 ml  Output 1150 ml  Net 1330 ml   Filed Weights   06/19/17 0243 06/20/17 0329 06/21/17 0326  Weight: 109.8 kg (242 lb) 108.9 kg (240 lb) 107.5 kg (237 lb)    Examination: General appearance: Elderly obese adult female, sitting up in chair, very deconditioned but interactive.  HEENT: Voice clearer, more forceful, still hoarse  Skin: Warm and dry.  Many bruises.  Puffy. Cardiac: RRR, nl S1-S2, no murmurs appreciated.  Capillary refill is brisk.  JVP not visible.  Anasarcic.   Respiratory: Normal respiratory rate and rhythm.  Diminished respirations, I do not appreciate rales or wheezes. Abdomen: Abdomen soft, obese.  No TTP, exam limited by habitus.   Neuro: Oriented to year, place, still subtly disoriented to our conversation.  HOH.  Moves extremities to command, face symmetric. Psych: Still difficult to assess because HOH and hoarse.  But seems fairly oriented today, she is just somewhat inattentive to our conversation, and I fear she is still a little  delirious.      Data Reviewed: I have personally reviewed following labs and imaging studies:  CBC: Recent Labs  Lab 06/16/17 0235 06/17/17 0322 06/18/17 0529 06/19/17 0330 06/20/17 0427  WBC 12.2* 10.1 12.5* 11.2* 11.3*  NEUTROABS 9.5* 8.1* 10.1* 8.7* 8.6*  HGB 10.3* 10.6* 11.1* 9.7* 9.5*  HCT 35.2* 35.6* 37.3 32.2* 32.0*  MCV 101.4* 100.8* 98.7 99.7 100.3*  PLT 111* 112* 124* 103* 601*   Basic Metabolic Panel: Recent Labs  Lab 06/15/17 0634  06/17/17 0322 06/18/17 1111 06/19/17 0330 06/20/17 0427 06/21/17 0332  NA 149*   < > 146* 148* 143 138 140  K 3.6   < > 3.5 3.3* 2.9* 3.9 4.0  CL 104   < > 103 106 99* 97* 96*  CO2 37*   < > 35* 35* 36* 36* 39*  GLUCOSE 123*   < > 96 133* 116* 133* 113*  BUN 32*   < > 22* 22* 22* 21* 25*  CREATININE 0.69   < > 0.64 0.70 0.71 0.66 0.71  CALCIUM 8.9   < > 8.3* 8.7* 8.4* 8.4* 8.5*  MG 2.4  --   --   --  2.1  --   --    < > = values in this interval not displayed.  GFR: Estimated Creatinine Clearance: 70.5 mL/min (by C-G formula based on SCr of 0.71 mg/dL). Liver Function Tests: Recent Labs  Lab 06/15/17 0634 06/16/17 0235  AST 25 19  ALT 40 34  ALKPHOS 59 63  BILITOT 0.8 0.5  PROT 5.2* 4.9*  ALBUMIN 2.6* 2.5*   No results for input(s): LIPASE, AMYLASE in the last 168 hours. No results for input(s): AMMONIA in the last 168 hours. Coagulation Profile: Recent Labs  Lab 06/16/17 0235  INR 1.45   Cardiac Enzymes: No results for input(s): CKTOTAL, CKMB, CKMBINDEX, TROPONINI in the last 168 hours. BNP (last 3 results) Recent Labs    01/11/17 1053  PROBNP 264.0*   HbA1C: No results for input(s): HGBA1C in the last 72 hours. CBG: Recent Labs  Lab 06/20/17 2027 06/21/17 0004 06/21/17 0342 06/21/17 0744 06/21/17 1126  GLUCAP 125* 101* 116* 126* 148*   Lipid Profile: No results for input(s): CHOL, HDL, LDLCALC, TRIG, CHOLHDL, LDLDIRECT in the last 72 hours. Thyroid Function Tests: No results for  input(s): TSH, T4TOTAL, FREET4, T3FREE, THYROIDAB in the last 72 hours. Anemia Panel: No results for input(s): VITAMINB12, FOLATE, FERRITIN, TIBC, IRON, RETICCTPCT in the last 72 hours. Urine analysis:    Component Value Date/Time   COLORURINE YELLOW 06/18/2017 1116   APPEARANCEUR CLEAR 06/18/2017 1116   LABSPEC 1.017 06/18/2017 1116   PHURINE 7.0 06/18/2017 1116   GLUCOSEU NEGATIVE 06/18/2017 1116   HGBUR NEGATIVE 06/18/2017 1116   [place] 06/18/2017 1116   BILIRUBINUR neg. 06/28/2013 [place] 06/18/2017 1116   PROTEINUR NEGATIVE 06/18/2017 1116   UROBILINOGEN 0.2 06/28/2013 1041   UROBILINOGEN 0.2 07/30/2009 1435   NITRITE NEGATIVE 06/18/2017 1116   LEUKOCYTESUR NEGATIVE 06/18/2017 1116   Sepsis Labs: @LABRCNTIP (procalcitonin:4,lacticidven:4)  ) Recent Results (from the past 240 hour(s))  Culture, Urine     Status: None   Collection Time: 06/18/17 11:09 AM  Result Value Ref Range Status   Specimen Description URINE, CLEAN CATCH  Final   Special Requests NONE  Final   Culture NO GROWTH  Final   Report Status 06/19/2017 FINAL  Final         Radiology Studies: No results found.      Scheduled Meds: . apixaban  10 mg Oral BID   Followed by  . [START ON 06/25/2017] apixaban  5 mg Oral BID  . bisoprolol  2.5 mg Oral Daily  . chlorhexidine  15 mL Mouth Rinse BID  . clopidogrel  75 mg Oral Daily  . cyanocobalamin  1,000 mcg Subcutaneous Daily  . ezetimibe  10 mg Per Tube QHS  . feeding supplement (PRO-STAT SUGAR FREE 64)  30 mL Per Tube TID  . folic acid  1 mg Per Tube Daily  . free water  375 mL Per Tube Q8H  . furosemide  40 mg Oral Daily  . hydrALAZINE  25 mg Per Tube Q8H  . insulin aspart  0-20 Units Subcutaneous Q4H  . insulin glargine  26 Units Subcutaneous Daily  . ipratropium-albuterol  3 mL Nebulization TID  . isosorbide dinitrate  10 mg Per Tube TID  . losartan  25 mg Oral Daily  . mouth rinse  15 mL Mouth Rinse q12n4p   . montelukast  10 mg Per Tube QHS  . pantoprazole sodium  40 mg Per Tube Daily  . pravastatin  40 mg Oral q1800  . predniSONE  10 mg Per Tube Q breakfast  . sodium chloride flush  10-40 mL Intracatheter Q12H  .  sodium chloride flush  3 mL Intravenous Q12H  . spironolactone  25 mg Per Tube Daily   Continuous Infusions: . sodium chloride 250 mL (06/11/17 1400)  . sodium chloride    . feeding supplement (JEVITY 1.2 CAL) 1,000 mL (06/21/17 0055)     LOS: 26 days    Time spent: 25 minutes    Edwin Dada, MD [facility] Pager [phone]  If 7PM-7AM, please contact night-coverage [email or web address] Password [facility] 06/21/2017, 12:31 PM

## 2017-06-21 NOTE — Progress Notes (Signed)
                                                                                                                                                                                                          Daily Progress Note   Patient Name: Kelli Velasquez       Date: 06/21/2017 DOB: 12/08/1940  Age: 76 y.o. MRN#: [id number] Attending Physician: Danford, Christopher P, * Primary Care Physician: Tower, Marne A, MD Admit Date: 05/26/2017  Reason for Consultation/Follow-up: Establishing goals of care  Subjective: Ms. Kelli Velasquez is alert, sitting up in bed working with SLP. Seems appropriate in responses. No complaints.   Length of Stay: 26  Current Medications: Scheduled Meds:  . apixaban  10 mg Oral BID   Followed by  . [START ON 06/25/2017] apixaban  5 mg Oral BID  . bisoprolol  2.5 mg Oral Daily  . chlorhexidine  15 mL Mouth Rinse BID  . clopidogrel  75 mg Oral Daily  . cyanocobalamin  1,000 mcg Subcutaneous Daily  . ezetimibe  10 mg Per Tube QHS  . feeding supplement (PRO-STAT SUGAR FREE 64)  30 mL Per Tube TID  . folic acid  1 mg Per Tube Daily  . free water  375 mL Per Tube Q8H  . furosemide  40 mg Oral Daily  . hydrALAZINE  25 mg Per Tube Q8H  . insulin aspart  0-20 Units Subcutaneous Q4H  . insulin glargine  26 Units Subcutaneous Daily  . ipratropium-albuterol  3 mL Nebulization TID  . isosorbide dinitrate  10 mg Per Tube TID  . losartan  25 mg Oral Daily  . mouth rinse  15 mL Mouth Rinse q12n4p  . montelukast  10 mg Per Tube QHS  . pantoprazole sodium  40 mg Per Tube Daily  . pravastatin  40 mg Oral q1800  . predniSONE  10 mg Per Tube Q breakfast  . sodium chloride flush  10-40 mL Intracatheter Q12H  . sodium chloride flush  3 mL Intravenous Q12H  . spironolactone  25 mg Per Tube Daily    Continuous Infusions: . sodium chloride 250 mL (06/11/17 1400)  . sodium chloride    . feeding supplement (JEVITY 1.2 CAL) 1,000 mL (06/21/17 0055)    PRN Meds: sodium chloride,  sodium chloride, albuterol, atropine, Gerhardt's butt cream, hydrALAZINE, sodium chloride flush  Physical Exam  Constitutional: She appears well-developed and well-nourished. She appears ill.  HENT:  Head: Normocephalic and atraumatic.  Cardiovascular: Normal rate.  Pulmonary/Chest: Effort normal. No accessory muscle usage. No tachypnea.   No respiratory distress.  Abdominal: Soft. Normal appearance.  Neurological: She is alert.  Mostly oriented to person/place but not to situation  Nursing note and vitals reviewed.           Vital Signs: BP (!) 127/44 (BP Location: Left Leg)   Pulse 81   Temp 98.9 F (37.2 C) (Oral)   Resp (!) 21   Ht 5' 4" (1.626 m)   Wt 107.5 kg (237 lb)   LMP 07/27/1980   SpO2 100%   BMI 40.68 kg/m  SpO2: SpO2: 100 % O2 Device: O2 Device: Nasal Cannula O2 Flow Rate: O2 Flow Rate (L/min): 3 L/min  Intake/output summary:   Intake/Output Summary (Last 24 hours) at 06/21/2017 1023 Last data filed at 06/21/2017 0600 Gross per 24 hour  Intake 2645 ml  Output 1300 ml  Net 1345 ml   LBM: Last BM Date: 06/19/17 Baseline Weight: Weight: 112 kg (247 lb) Most recent weight: Weight: 107.5 kg (237 lb)       Palliative Assessment/Data:    Flowsheet Rows     Most Recent Value  Intake Tab  Referral Department  Hospitalist  Unit at Time of Referral  Intermediate Care Unit  Palliative Care Primary Diagnosis  Pulmonary  Date Notified  06/19/17  Palliative Care Type  New Palliative care  Reason for referral  Clarify Goals of Care  Date of Admission  05/26/17  Date first seen by Palliative Care  06/19/17  # of days Palliative referral response time  0 Day(s)  # of days IP prior to Palliative referral  24  Clinical Assessment  Palliative Performance Scale Score  30%  Pain Max last 24 hours  Not able to report  Pain Min Last 24 hours  Not able to report  Dyspnea Max Last 24 Hours  Not able to report  Dyspnea Min Last 24 hours  Not able to report  Nausea  Max Last 24 Hours  Not able to report  Nausea Min Last 24 Hours  Not able to report  Anxiety Max Last 24 Hours  Not able to report  Anxiety Min Last 24 Hours  Not able to report  Other Max Last 24 Hours  Not able to report  Psychosocial & Spiritual Assessment  Palliative Care Outcomes  Patient/Family meeting held?  Yes  Who was at the meeting?  husband, son Matt, dtr Mary  Patient/Family wishes: Interventions discontinued/not started   PEG  Palliative Care follow-up planned  Yes, Facility      Patient Active Problem List   Diagnosis Date Noted  . Palliative care encounter   . Unstable angina (HCC)   . Acute respiratory failure with hypercapnia (HCC)   . COPD exacerbation (HCC)   . Acute on chronic systolic congestive heart failure (HCC)   . Encounter for screening mammogram for breast cancer 02/02/2017  . Chronic combined systolic and diastolic CHF, NYHA class 2 (HCC) 02/02/2017  . Depressed mood 12/02/2016  . Routine general medical examination at a health care facility 12/30/2015  . Supraumbilical hernia 04/16/2015  . Lump in the abdomen 04/10/2015  . GERD (gastroesophageal reflux disease) 10/10/2014  . B12 deficiency 10/09/2014  . Skin lesions 10/09/2014  . Cataracts, bilateral 10/09/2014  . Dry eyes 10/09/2014  . Fatigue 05/08/2014  . Pedal edema 05/08/2014  . Shoulder tendonitis 01/24/2014  . Osteopenia 11/28/2013  . Estrogen deficiency 11/02/2013  . Dyspnea on exertion 06/27/2013  . Hernia, incisional 04/03/2013  . 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   Cough 12/18/2012  . Chronic respiratory failure with hypoxia and hypercapnia ([place]) 10/26/2012  . Special screening for malignant neoplasms, colon 06/16/2011  . DYSPHONIA 10/10/2010  . HYPERGLYCEMIA 05/27/2010  . CAD, NATIVE VESSEL 08/19/2009  . 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 Severe obesity (BMI >= 40) ([place]) 09/01/2007  . MYOPIA 03/03/2007  . Hyperlipidemia  10/15/2006  . Essential hypertension 10/15/2006  . Allergic rhinitis 10/15/2006  . Cough variant asthma vs UACS  10/15/2006  . OVERACTIVE BLADDER 10/15/2006  . OSTEOARTHRITIS 10/15/2006  . Urinary incontinence 10/15/2006    Palliative Care Assessment & Plan   HPI: 76 y.o. female  with past medical history of systolic heart failure, Boop, chronic respiratory failure, obesity, coronary artery disease, admitted on 05/26/2017 with M SSA pneumonia with acute respiratory failure requiring intubation/extubation on 05/30/2017.  Later patient developed another episode of respiratory distress requiring repeat intubation on 06/02/2017.  She was extubated 06/07/2017 and is now on the stepdown unit.  After extubation patient remains aphonic.  Bronchoscopy was performed on 06/02/2017, cultures so far negative.  Sputum culture is growing M SSA.  Antibiotic course completed on 06/11/2017.  Patient also has acute systolic heart failure and cardiology was consulted and is currently on IV Lasix.  Patient also underwent a cardiac cath with stenting on 06/16/2017.  Patient's hospital course has been complicated by delirium  Consult for early goals of care, introduction to palliative medicine services, support going forward.   Assessment: I met today at Ms. Mateja's bedside along with her husband and daughter. The are very pleased with her progress today. She is much less confused and more interactive with them. More like herself. SLP is at bedside and hopeful for MBS/FEES tomorrow to begin diet if she continues to improve as she is today.   Introduced myself as palliative care and that I would continue to follow and check with them. Encouraged them to call with any questions or concerns. They are happy with progress and improvement and continue to hope for transition to rehab.   Recommendations/Plan:  SLP following and hopeful for diet initiation tomorrow.   Delirium improving - continue to avoid sedating meds.  Good sleep habits at night and minimize interruptions. Lights on during day and family stimulation to encourage wakefulness.   Goals of Care and Additional Recommendations:  Limitations on Scope of Treatment: DNR, no PEG  Code Status:  DNR  Prognosis:   Unable to determine  Discharge Planning:  [facility] for rehab with Palliative care service follow-up  Thank you for allowing the Palliative Medicine Team to assist in the care of this patient.   Total Time 15 MIN Prolonged Time Billed  no       Greater than 50%  of this time was spent counseling and coordinating care related to the above assessment and plan.  Vinie Sill, NP Palliative Medicine Team Pager # [phone] (M-F 8a-5p) Team Phone # [phone] (Nights/Weekends)

## 2017-06-22 ENCOUNTER — Inpatient Hospital Stay (HOSPITAL_COMMUNITY): Payer: Medicare Other

## 2017-06-22 DIAGNOSIS — I200 Unstable angina: Secondary | ICD-10-CM

## 2017-06-22 LAB — BLOOD GAS, ARTERIAL
ACID-BASE EXCESS: 16.3 mmol/L — AB (ref 0.0–2.0)
Bicarbonate: 42 mmol/L — ABNORMAL HIGH (ref 20.0–28.0)
Drawn by: 313941
O2 CONTENT: 3 L/min
O2 Saturation: 86.3 %
PCO2 ART: 67.7 mmHg — AB (ref 32.0–48.0)
PH ART: 7.409 (ref 7.350–7.450)
Patient temperature: 98.6
pO2, Arterial: 52.9 mmHg — ABNORMAL LOW (ref 83.0–108.0)

## 2017-06-22 LAB — GLUCOSE, CAPILLARY
GLUCOSE-CAPILLARY: 100 mg/dL — AB (ref 65–99)
GLUCOSE-CAPILLARY: 131 mg/dL — AB (ref 65–99)
GLUCOSE-CAPILLARY: 184 mg/dL — AB (ref 65–99)
Glucose-Capillary: 116 mg/dL — ABNORMAL HIGH (ref 65–99)
Glucose-Capillary: 119 mg/dL — ABNORMAL HIGH (ref 65–99)
Glucose-Capillary: 136 mg/dL — ABNORMAL HIGH (ref 65–99)
Glucose-Capillary: 192 mg/dL — ABNORMAL HIGH (ref 65–99)

## 2017-06-22 LAB — BASIC METABOLIC PANEL
Anion gap: 8 (ref 5–15)
BUN: 27 mg/dL — AB (ref 6–20)
CHLORIDE: 94 mmol/L — AB (ref 101–111)
CO2: 40 mmol/L — AB (ref 22–32)
Calcium: 9 mg/dL (ref 8.9–10.3)
Creatinine, Ser: 0.67 mg/dL (ref 0.44–1.00)
GFR calc Af Amer: >60 mL/min (ref >60)
GFR calc non Af Amer: >60 mL/min (ref >60)
GLUCOSE: 132 mg/dL — AB (ref 65–99)
POTASSIUM: 3.8 mmol/L (ref 3.5–5.1)
Sodium: 142 mmol/L (ref 135–145)

## 2017-06-22 MED ORDER — MONTELUKAST SODIUM 10 MG PO TABS
10.0000 mg | ORAL_TABLET | Freq: Every day | ORAL | Status: DC
  Administered 2017-06-22 – 2017-06-25 (×4): 10 mg via ORAL
  Filled 2017-06-22 (×4): qty 1

## 2017-06-22 MED ORDER — HYDRALAZINE HCL 10 MG PO TABS
10.0000 mg | ORAL_TABLET | Freq: Three times a day (TID) | ORAL | Status: DC
  Filled 2017-06-22: qty 1

## 2017-06-22 MED ORDER — PREDNISONE 10 MG PO TABS
10.0000 mg | ORAL_TABLET | Freq: Every day | ORAL | Status: DC
  Administered 2017-06-23 – 2017-06-26 (×4): 10 mg via ORAL
  Filled 2017-06-22 (×4): qty 1

## 2017-06-22 MED ORDER — FOLIC ACID 1 MG PO TABS
1.0000 mg | ORAL_TABLET | Freq: Every day | ORAL | Status: DC
  Administered 2017-06-23 – 2017-06-26 (×4): 1 mg via ORAL
  Filled 2017-06-22 (×4): qty 1

## 2017-06-22 MED ORDER — RESOURCE THICKENUP CLEAR PO POWD
ORAL | Status: DC | PRN
  Filled 2017-06-22: qty 125

## 2017-06-22 MED ORDER — HYDRALAZINE HCL 10 MG PO TABS
10.0000 mg | ORAL_TABLET | Freq: Three times a day (TID) | ORAL | Status: DC
  Administered 2017-06-22 – 2017-06-23 (×4): 10 mg via ORAL
  Filled 2017-06-22 (×5): qty 1

## 2017-06-22 MED ORDER — PANTOPRAZOLE SODIUM 40 MG PO TBEC
40.0000 mg | DELAYED_RELEASE_TABLET | Freq: Every day | ORAL | Status: DC
  Administered 2017-06-23 – 2017-06-26 (×4): 40 mg via ORAL
  Filled 2017-06-22 (×4): qty 1

## 2017-06-22 MED ORDER — POTASSIUM CHLORIDE CRYS ER 20 MEQ PO TBCR
40.0000 meq | EXTENDED_RELEASE_TABLET | Freq: Once | ORAL | Status: AC
  Administered 2017-06-22: 40 meq via ORAL
  Filled 2017-06-22: qty 2

## 2017-06-22 MED ORDER — SPIRONOLACTONE 25 MG PO TABS
25.0000 mg | ORAL_TABLET | Freq: Every day | ORAL | Status: DC
  Administered 2017-06-23 – 2017-06-26 (×4): 25 mg via ORAL
  Filled 2017-06-22 (×4): qty 1

## 2017-06-22 MED ORDER — LOSARTAN POTASSIUM 50 MG PO TABS
25.0000 mg | ORAL_TABLET | Freq: Two times a day (BID) | ORAL | Status: DC
  Administered 2017-06-22 – 2017-06-23 (×2): 25 mg via ORAL
  Filled 2017-06-22 (×2): qty 1

## 2017-06-22 MED ORDER — EZETIMIBE 10 MG PO TABS
10.0000 mg | ORAL_TABLET | Freq: Every day | ORAL | Status: DC
  Administered 2017-06-22 – 2017-06-25 (×4): 10 mg via ORAL
  Filled 2017-06-22 (×4): qty 1

## 2017-06-22 MED ORDER — SODIUM CHLORIDE 0.9% FLUSH
10.0000 mL | INTRAVENOUS | Status: DC | PRN
  Administered 2017-06-23: 20 mL
  Administered 2017-06-23 – 2017-06-25 (×3): 10 mL
  Filled 2017-06-22 (×4): qty 40

## 2017-06-22 MED ORDER — ISOSORBIDE DINITRATE 10 MG PO TABS
10.0000 mg | ORAL_TABLET | Freq: Three times a day (TID) | ORAL | Status: DC
  Administered 2017-06-22 – 2017-06-23 (×4): 10 mg via ORAL
  Filled 2017-06-22 (×5): qty 1

## 2017-06-22 NOTE — Progress Notes (Signed)
Palliative:  Went to visit with Ms. Erker and her husband and daughter at bedside. She has done well with SLP and PT today. Family pleased with progress and have no questions or concerns. Hopeful for continued improvement and transition to rehab facility. Ms. Thier is sleeping but arouses to greet me briefly.   No charge  Vinie Sill, NP Palliative Medicine Team Pager # [phone] (M-F 8a-5p) Team Phone # [phone] (Nights/Weekends)

## 2017-06-22 NOTE — Progress Notes (Signed)
Modified Barium Swallow Progress Note  Patient Details  Name: Kelli Velasquez MRN: [id number] Date of Birth: Aug 09, 1940  Today's Date: 06/22/2017  Modified Barium Swallow completed.  Full report located under Chart Review in the Imaging Section.  Brief recommendations include the following:  Clinical Impression  Pt has improving oropharyngeal function as her overall respiratory status, phonation (glottal closure), and mentation continue to progress. She has what appears to be a cognitively-based oral dysphagia, marked by oral holding, lingual rocking, and mildly prolonged mastication. Her swallow triggers at the valleculae with all consistencies with brief pooling before the swallow. Her pharyngeal clearance is good, but mildly decreased hyolaryngeal movement and decreased glottal closure results in trace penetration of nectar thick liquids on the first few cup sips. This cleared easily with a cued throat clear, and as the study progressed, her airway protection improved - even with consecutive cup/straw sips. She did still have deeper penetration to the vocal folds with thin liquids. She triggered a throat clear once it reached the vocal folds, which seemed to clear most but not all of the penetrates from her laryngeal vestibule. Given her sensitivity to aspiration this admission and her still altered mentation, recommend to start more conservatively with Dys 2 textures and nectar thick liquids. As she continues to progress cognitively, and potentially as the NGT is removed, she has good prognosis for further advancement.   Swallow Evaluation Recommendations       SLP Diet Recommendations: Dysphagia 2 (Fine chop) solids;Nectar thick liquid   Liquid Administration via: Cup;Straw   Medication Administration: Whole meds with puree   Supervision: Staff to assist with self feeding;Full supervision/cueing for compensatory strategies   Compensations: Slow rate;Small sips/bites;Clear throat  intermittently   Postural Changes: Seated upright at 90 degrees   Oral Care Recommendations: Oral care BID        [name] 06/22/2017,1:55 PM   [name], M.A. CCC-SLP [phone]

## 2017-06-22 NOTE — Progress Notes (Signed)
Patient ID: Kelli Velasquez, female   DOB: 1941/02/01, 76 y.o.   MRN: [id number]      Advanced Heart Failure Rounding Note  Subjective:    Over 2 weeks prior to admission, she had increased dyspnea with exertion, she was short of breath at rest on the day of admission.  Her husband says she has been taking 20 mg of lasix daily and occasionally an extra 20 mg of lasix.   Presented to [facility] ED with respiratory distress. Family reported increased dyspnea over the week prior to admission. In ED she received solumedrol and duonebs. Placed on Bipap but continue decline requiring intubation. CXR concerning for interstitial edema and bilateral effusions.  Extubated 11/4. Noted to have swelling of upper airways on speech/swallow evaluation 11/6.  Developed respiratory distress 11/6 in the evening with hypoxia and hypercapnia and was re-intubated.    Extubated 06/07/17.   CXR 06/09/17 with LLL PNA.  She has completed abx for MSSA PNA.   UE [place] 06/14/17 with non-occlusive DVT in right axillary vein. Noted around PICC line in right basilic vein, and short segment of SVT noted in the forearm within the cephalic vein. LUE negative  BLE [place] 06/14/17 negative for DVTs.  LHC (11/21) with DES to RCA (95% mid RCA stenosis).   Denies SOB/CP.   Cardiac Studies: Echo 05/26/2017 EF 35-40%  Grade I DD RV normal Echo 2017 EF 45-50% with WMA Myoview 2017 No significant ischemia EF 48%.  [place] 2011- DES LAD and RCA.   Objective:   Weight Range: 251 lb (113.9 kg) Body mass index is 43.08 kg/m.   Vital Signs:   Temp:  [97.5 F (36.4 C)-99.5 F (37.5 C)] 98.6 F (37 C) (11/27 0446) Pulse Rate:  [73-86] 80 (11/27 0446) Resp:  [18-26] 18 (11/27 0446) BP: (114-186)/(45-161) 127/59 (11/27 0446) SpO2:  [86 %-100 %] 98 % (11/27 0903) FiO2 (%):  [28 %] 28 % (11/26 1333) Weight:  [251 lb (113.9 kg)] 251 lb (113.9 kg) (11/27 0300) Last BM Date: 06/19/17  Weight change: Filed Weights   06/20/17 0329 06/21/17 0326  06/22/17 0300  Weight: 240 lb (108.9 kg) 237 lb (107.5 kg) 251 lb (113.9 kg)   Intake/Output:   Intake/Output Summary (Last 24 hours) at 06/22/2017 1002 Last data filed at 06/22/2017 2505 Gross per 24 hour  Intake 1650 ml  Output 1200 ml  Net 450 ml    Physical Exam  General:  No resp difficulty. In bed  HEENT: normal Neck: supple. Hard to assess due body habitus. Carotids 2+ bilat; no bruits. No lymphadenopathy or thryomegaly appreciated. Cor: PMI nondisplaced. Regular rate & rhythm. No rubs, gallops or murmurs. Lungs: Decreased on 4 liters Oxygen.  Abdomen: obese, soft, nontender, nondistended. No hepatosplenomegaly. No bruits or masses. Good bowel sounds. Extremities: no cyanosis, clubbing, rash, edema Neuro: alert & orientedx3, cranial nerves grossly intact. moves all 4 extremities w/o difficulty. Affect pleasant    Telemetry  NSR 70-80s pacs personally reviewed.    Labs    CBC Recent Labs    06/20/17 0427  WBC 11.3*  NEUTROABS 8.6*  HGB 9.5*  HCT 32.0*  MCV 100.3*  PLT 397*   Basic Metabolic Panel Recent Labs    06/21/17 0332 06/22/17 0337  NA 140 142  K 4.0 3.8  CL 96* 94*  CO2 39* 40*  GLUCOSE 113* 132*  BUN 25* 27*  CREATININE 0.71 0.67  CALCIUM 8.5* 9.0   Liver Function Tests No results for input(s): AST, ALT,  ALKPHOS, BILITOT, PROT, ALBUMIN in the last 72 hours. No results for input(s): LIPASE, AMYLASE in the last 72 hours. Cardiac Enzymes No results for input(s): CKTOTAL, CKMB, CKMBINDEX, TROPONINI in the last 72 hours. BNP: BNP (last 3 results) Recent Labs    05/26/17 0610  BNP 760.9*   ProBNP (last 3 results) Recent Labs    01/11/17 1053  PROBNP 264.0*   D-Dimer No results for input(s): DDIMER in the last 72 hours. Hemoglobin A1C No results for input(s): HGBA1C in the last 72 hours. Fasting Lipid Panel No results for input(s): CHOL, HDL, LDLCALC, TRIG, CHOLHDL, LDLDIRECT in the last 72 hours. Thyroid Function Tests No  results for input(s): TSH, T4TOTAL, T3FREE, THYROIDAB in the last 72 hours.  Invalid input(s): FREET3  Other results:  Imaging   No results found.  Medications:     Scheduled Medications: . apixaban  10 mg Oral BID   Followed by  . [START ON 06/25/2017] apixaban  5 mg Oral BID  . bisoprolol  2.5 mg Oral Daily  . chlorhexidine  15 mL Mouth Rinse BID  . clopidogrel  75 mg Oral Daily  . cyanocobalamin  1,000 mcg Subcutaneous Daily  . ezetimibe  10 mg Per Tube QHS  . feeding supplement (PRO-STAT SUGAR FREE 64)  30 mL Per Tube TID  . folic acid  1 mg Per Tube Daily  . free water  375 mL Per Tube Q8H  . furosemide  40 mg Oral Daily  . hydrALAZINE  25 mg Per Tube Q8H  . insulin aspart  0-20 Units Subcutaneous Q4H  . insulin glargine  26 Units Subcutaneous Daily  . ipratropium-albuterol  3 mL Nebulization TID  . isosorbide dinitrate  10 mg Per Tube TID  . losartan  25 mg Oral Daily  . mouth rinse  15 mL Mouth Rinse q12n4p  . montelukast  10 mg Per Tube QHS  . pantoprazole sodium  40 mg Per Tube Daily  . pravastatin  40 mg Oral q1800  . predniSONE  10 mg Per Tube Q breakfast  . spironolactone  25 mg Per Tube Daily    Infusions: . 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  Admitted with acute/chronic respiratory failure  Assessment/Plan   1. Acute/chronic systolic CHF: Ischemic cardiomyopathy given prior history.  Echo this admission with EF down to 35-40% range, around 50% in past.  She was admitted with worsening dyspnea, elevated BNP, CXR with pulmonary edema => LHC 11/21 showed tight RCA stenosis, so may have been ischemia-mediated flash pulmonary edema.   - Volume status stable. Continue 40 mg po lasix daily.  -  Continue current dose of hydralazine 25 mg tid and isordil to 10 mg tid.  Continue losartan 25 mg daily.  - Continue spironolactone 25 mg daily.  - Would keep her on bisoprolol 2.5 mg daily.  If HR dips to 40s while asleep and BP stable, not concerning.  2. Acute hypoxemic respiratory failure: Initially thought multifactorial due to pulmonary edema, effusions, BOOP and OHS.  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  She was diuresed.  At baseline, she is on 2L home oxygen.  Extubated 11/4 but developed respiratory distress 11/6 and re-intubated.  She had swelling of  the upper airways noted by speech/swallow exam.  Found to have MSSA PNA.  She has now completed antibiotic course.   - Improved. On 4L O2 by [place], using Bipap at night.  3. CAD: h/o DES to LAD and RCA in 2011.  No chest pain but admitted with severe dyspnea/volume overload and fall in EF on echo.  Troponin mildly elevated but no trend. Cath 11/21 with severe mid RCA stenosis, treated with DES.  Suspect this may have triggered ischemic flash pulmonary edema.  - She got Brilinta with cath, have transitioned to Plavix with no ASA (as she will need ongoing anticoagulation for RUE DVT).  Not on ASA given anticoagulation need.  - She has not tolerated Zocor or Crestor.   - Continue low dose pravastatin 40 mg daily.  4.  AKI: Resolved. 5. HTN: Stable today.  6. Code status: DNR/DNI at this time. No change.  7. NSVT: No recurrence. Keep on bisoprolol at low dose.  8. DVT: RUE, triggered by PICC. Started on bivalirudin 06/14/17 on hematology recommendation (platelets had fallen but HIT negative by SRA).   - PICC has been removed.  - Given triggered RUE DVT, would favor 3 months anticoagulation with DOAC => transitioned from bivalirudin to Eliquis with DVT dosing. Continue 10 mg twice a day until Friday then cut back to 5 mg twice a day.    9. Delirium: resolved.     Length of Stay: [place], NP  06/22/2017 10:02 AM  Advanced Heart Failure  Team Pager [phone] (M-F; [place])  Please contact [facility] for night-coverage after hours (4p -7a ) and weekends on [email or web address]  Patient seen with NP, agree with the above note.  Stable from cardiac standpoint.  Will titrate down on hydralazine and up on losartan, eventually replace hydralazine/nitrates with losartan.  Continue higher dose Eliquis until Friday then down to 5 mg bid.  Continue Plavix, no ASA.   Loralie Champagne 06/22/2017 11:19 AM

## 2017-06-22 NOTE — Progress Notes (Signed)
   06/22/17 1127  Height and Weight  Weight 105.9 kg (233 lb 7.5 oz)  Type of Scale Used Standing  Type of Weight Actual

## 2017-06-22 NOTE — Progress Notes (Signed)
Physical Therapy Treatment Patient Details Name: Kelli Velasquez MRN: [id number] DOB: 10-10-1940 Today's Date: 06/22/2017    History of Present Illness 76 yo female admitted for progressive respiratory distress.  She then developed altered mental status.  Pt has a past medical history including Allergy, Asthma, Chronic bronchitis, CAD, Dyspnea, GERD, Hyperlipidemia, Hypertension, Myocardial infarction, Myopia, Neoplasm of skin, Nocturnal oxygen desaturation, Obesity, Osteoarthritis, Oxygen deficiency,  Urinary incontinence, and Vertigo. Required intubation 10/31-11/4 and  11/6-11/14, now off Bipap.    PT Comments    Patient seen for mobility progression. TOlerated bed mobility hygiene and pericare prior to OOB activity. Patient able to stand on scale with UE support and transfer with less assist today.Current POC remains appropriate. Will see as indicated and progress as tolerated.     Follow Up Recommendations  SNF;Supervision/Assistance - 24 hour     Equipment Recommendations  None recommended by PT;Other (comment)    Recommendations for Other Services       Precautions / Restrictions Precautions Precautions: Fall Precaution Comments: watch O2 saturations and HR Restrictions Weight Bearing Restrictions: No    Mobility  Bed Mobility Overal bed mobility: Needs Assistance Bed Mobility: Rolling;Sidelying to Sit Rolling: Min assist Sidelying to sit: Mod assist;HOB elevated       General bed mobility comments: Min assist to initate rolling in bilateral directions, moderate assist to elevate trunk to upright at [place] Overall transfer level: Needs assistance Equipment used: Rolling walker (2 wheeled) Transfers: Sit to/from [facility] Sit to Stand: Min assist;+2 physical assistance Stand pivot transfers: Min assist;+2 physical assistance       General transfer comment: Less physical assist required to power up to standing today. Multi modal cues for  safety. Patient able to stand and step on to scale for standing weight prior to pivot to chair  Ambulation/Gait                 Stairs            Wheelchair Mobility    Modified Rankin (Stroke Patients Only)       Balance Overall balance assessment: Needs assistance Sitting-balance support: Bilateral upper extremity supported;Feet supported;No upper extremity supported Sitting balance-Leahy Scale: Fair Sitting balance - Comments: UE support for comfort   Standing balance support: During functional activity;Bilateral upper extremity supported Standing balance-Leahy Scale: Poor Standing balance comment: relies on UE support and external support.                             Cognition Arousal/Alertness: Awake/alert Behavior During Therapy: WFL for tasks assessed/performed Overall Cognitive Status: Impaired/Different from baseline Area of Impairment: Attention;Memory;Following commands;Safety/judgement;Awareness                   Current Attention Level: Sustained Memory: Decreased recall of precautions;Decreased short-term memory Following Commands: Follows one step commands inconsistently Safety/Judgement: Decreased awareness of safety;Decreased awareness of deficits Awareness: Intellectual   General Comments: some improvements in following directions and cues for safety today, continues to demonstrate slow processing throughout session      Exercises      General Comments General comments (skin integrity, edema, etc.): hygiene and pericare performed with patient prior to OOB activity      Pertinent Vitals/Pain Pain Assessment: No/denies pain    Home Living                      Prior Function  PT Goals (current goals can now be found in the care plan section) Acute Rehab PT Goals Patient Stated Goal: get OOB  PT Goal Formulation: With patient Time For Goal Achievement: 06/28/17 Potential to Achieve Goals:  Fair Progress towards PT goals: Progressing toward goals    Frequency    Min 3X/week      PT Plan Current plan remains appropriate    Co-evaluation PT/OT/SLP Co-Evaluation/Treatment: (dove tailed with SLP for upright positioning)            AM-PAC PT "6 Clicks" Daily Activity  Outcome Measure  Difficulty turning over in bed (including adjusting bedclothes, sheets and blankets)?: Unable Difficulty moving from lying on back to sitting on the side of the bed? : Unable Difficulty sitting down on and standing up from a chair with arms (e.g., wheelchair, bedside commode, etc,.)?: Unable Help needed moving to and from a bed to chair (including a wheelchair)?: A Lot Help needed walking in hospital room?: A Lot Help needed climbing 3-5 steps with a railing? : Total 6 Click Score: 8    End of Session Equipment Utilized During Treatment: Oxygen;Gait belt Activity Tolerance: Patient limited by fatigue Patient left: with call bell/phone within reach;with family/visitor present;in chair;with chair alarm set Nurse Communication: Mobility status PT Visit Diagnosis: Unsteadiness on feet (R26.81);Difficulty in walking, not elsewhere classified (R26.2)     Time: 3159-4585 PT Time Calculation (min) (ACUTE ONLY): 24 min  Charges:  $Therapeutic Activity: 8-22 mins                    G Codes:       Alben Deeds, PT DPT  Board Certified Neurologic Specialist [place] 06/22/2017, 11:44 AM

## 2017-06-22 NOTE — Care Management Important Message (Signed)
Important Message  Patient Details  Name: Kelli Velasquez MRN: [id number] Date of Birth: [birth year]   Medicare Important Message Given:  Yes    Nathen May 06/22/2017, 10:35 AM

## 2017-06-22 NOTE — Progress Notes (Signed)
  Speech Language Pathology Treatment: Dysphagia  Patient Details Name: Kelli Velasquez MRN: [id number] DOB: [birth year] Today's Date: 06/22/2017 Time: 1101-1109 SLP Time Calculation (min) (ACUTE ONLY): 8 min  Assessment / Plan / Recommendation Clinical Impression  Pt's voice is hoarse but she can now sustain her phonation through the sentence level. She consumed ice chips without overt signs of aspiration and she will now cough and swallow to command with Min cues. She is ready for a repeat swallow study. Will attempt MBS this afternoon.   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 She had significant respiratory distress in ER and failed trial bipap requiring intubation 10/31-11/4. FEES 11/6 showed severe edema and weakness, standing secretions, and diffuse residue that could not be cleared. Recommendations at that time were to remain NPO except for a few ice chips given after oral care. Pt was reintubated 11/7-11/12 and stayed on BiPAP post-extubation. SLP was consulted to reassess swallowing.      SLP Plan  MBS       Recommendations  Diet recommendations: NPO;Other(comment)(ice chips) Medication Administration: Via alternative means                Oral Care Recommendations: Oral care prior to ice chip/H20;Oral care QID Follow up Recommendations: Skilled Nursing facility SLP Visit Diagnosis: Dysphagia, oropharyngeal phase (R13.12) Plan: MBS       GO                [name] 06/22/2017, 12:05 PM  [name], M.A. CCC-SLP [phone]

## 2017-06-22 NOTE — Progress Notes (Signed)
PROGRESS NOTE    Kelli Velasquez  NKN:[id number] DOB: 03/06/1941 DOA: 05/26/2017 PCP: Abner Greenspan, MD      Brief Narrative:  76 yo F with CHF, A. fib, CAD, HTN, BOOP admitted on 10/31 with SOB, was found to have MSSA pneumonia with acute respiratory failure --> intubation and extubated on 05/30/2017.  Later patient had another episode of respiratory distress requiring repeat intubation on 06/02/2017.  Finally extubated on 06/07/2017, but remained aphonic, delirious after second extubation. Bronchoscopy performed on 06/02/2017, culture grew MSSA patient completed antibiotic course on 06/11/2017.   Also was being treated for acute systolic CHF, Cardiology consulted and diuresed with IV Lasix, ultimately went for cardiac cath.  Cath 11/21 showed single vessel disease, stented.  Currently plan is continue monitoring for improvement in swalllowing, voice quality so we can remove the Cortrak and discharge to SNF.         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 Acute hypoxic respiratory failure from CHF, coronary ischemia Patient initially presented with respiratory failure, required intubation, treated with aggressive diuresis for CHF and Solu-medrol for BOOP.  Extubated after 5 days, but failed after 4 days and was reintubated from 11/7-11/12.  During second intubation developed MSSA pneumonia, now resolved.  Now off antibiotics, diuretics scaled back and back to home level O2, home steroids from Pulmonary standpoint. Respiratory failure is  mostly now resolved, back on home O2. -Continue Lasix daily oral -Monitor I/Os closely    2. Delirium: From hyperNa, hypercarbia, hypoxia, and ICU delirium. Improved greatly in last 5 days. -Consult Palliative Care, for help coordinating discharge planning, overall goals of care, appreciate input, they have begun to scale back as paitent has improved in last week. Continue delirium precautions:   -Lights and TV off, minimize interruptions at night  -Blinds open and lights on during day  -Glasses/hearing aid with patient  -Frequent reorientation  -PT/OT when able  -Avoid sedation medications/Beers list medications  3. Dysphagia and aphonia Family have indicated PEG is not within patient's goals of care. MBS today allowed patient to advance to DYS 2 and nectar thick. -Maintain Cortrak for now -SLP consult appreciated  4. Hearing loss: Patient without prior hearing loss.  ?now if she is hard of hearing, or has persistent hypoactive delirium.   -Rehab audiology will perform audiological assessment Weds at [place]  5.  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 fib with RVR, and CAD.  Echo showed EF 35%. Cath 11/21 showed single vessel disease, stented.   -Consult cardiology, appreciate recommendations -Continue home oral Lasix -Continue Plavix -Continue Isordil, Spironolactone per Cardiology -Continue BB, hydralazine -Antihypertensive doses were reduced given hypotension, bradycardia  6. History of BOOP -Continue prednisone back to baseline   7. MSSA pneumonia: Completed antibiotics  8. Diabetes -Continue SSI  9. Severe protein calorie malnutrition -Continue tube feeds per NG  10. DVT: UE DVT in setting of PICC.  Thrombocytopenia with negative HIT panel, avoiding heparins. -Continue Eliquis  11. Hypernatremia Resolved -Free water flushes -Daily BMP  12. Macrocytic anemia Mild  13. Hypokalemia Mag  nl. -Supplement        DVT prophylaxis: Eliquis Code Status: DNR Family Communication: Husband Disposition Plan: Prolonged delirium, delaying ability to swallow, although much clearer in last few days, progressed to DYS 2 diet today.  Continue to work with SLP, PT.  Hopefully to SNF by end of week.    Consultants:   Cardiology  CCM  Palliative  Procedures:   Echocardiogram  Bronchoscopy  Intubation  Antimicrobials:   Ceftazidime 11/6 >> 11/10  Ceftriaxone 11/10 >> 11/16    Subjective: In bed, interactive.  Pleasant.  Somewhat inattentive.  No chest pain, dyspnea, confusion.  Still very weak.  Good appetite.  Happy to be eating again.  Objective: Vitals:   06/22/17 1423 06/22/17 1920 06/22/17 2026 06/22/17 2041  BP:   (!) 135/51 124/77  Pulse:   78 73  Resp:   19   Temp:   98.7 F (37.1 C)   TempSrc:   Oral   SpO2: 97% 100% 93%   Weight:      Height:        Intake/Output Summary (Last 24 hours) at 06/22/2017 2159 Last data filed at 06/22/2017 1400 Gross per 24 hour  Intake 2023 ml  Output 900 ml  Net 1123 ml   Filed Weights   06/21/17 0326 06/22/17 0300 06/22/17 1127  Weight: 107.5 kg (237 lb) 113.9 kg (251 lb) 105.9 kg (233 lb 7.5 oz)    Examination: General appearance: Elderly obese adult female, lying in bed, very deconditioned but interactive.  HEENT: Voice clearer, more forceful, still hoarse  Skin: Warm and dry.  Many bruises.  Puffy. Cardiac: RRR, nl S1-S2, no murmurs appreciated.  Capillary refill is brisk.  JVP not visible.  Anasarcic.   Respiratory: Normal respiratory rate and rhythm.  Diminished respirations, I do not appreciate rales or wheezes. Abdomen: Abdomen soft, obese.  No TTP, exam limited by habitus.   Neuro: Oriented to year, place, still subtly disoriented to our conversation.  HOH.  Moves extremities to command, face symmetric. Psych: Still difficult to assess because HOH and hoarse.  But seems fairly oriented today, she  is just somewhat inattentive to our conversation, and again I fear she is still a little delirious.      Data Reviewed: I have personally reviewed following labs and imaging studies:  CBC: Recent Labs  Lab 06/16/17 0235 06/17/17 0322 06/18/17 0529 06/19/17 0330 06/20/17 0427  WBC 12.2* 10.1 12.5* 11.2* 11.3*  NEUTROABS 9.5* 8.1* 10.1* 8.7* 8.6*  HGB 10.3* 10.6* 11.1* 9.7* 9.5*  HCT 35.2* 35.6* 37.3 32.2* 32.0*  MCV 101.4* 100.8* 98.7 99.7 100.3*  PLT 111* 112* 124* 103* 263*   Basic Metabolic Panel: Recent Labs  Lab 06/18/17 1111 06/19/17 0330 06/20/17 0427 06/21/17 0332 06/22/17 0337  NA 148* 143 138 140 142  K 3.3* 2.9* 3.9 4.0 3.8  CL 106 99* 97* 96* 94*  CO2 35* 36* 36* 39* 40*  GLUCOSE 133* 116* 133* 113* 132*  BUN 22* 22* 21* 25* 27*  CREATININE 0.70 0.71 0.66 0.71 0.67  CALCIUM 8.7* 8.4* 8.4* 8.5* 9.0  MG  --  2.1  --   --   --    GFR: Estimated Creatinine Clearance: 69.9 mL/min (by C-G formula based on SCr of 0.67 mg/dL). Liver Function Tests: Recent Labs  Lab 06/16/17 0235  AST 19  ALT 34  ALKPHOS 63  BILITOT 0.5  PROT 4.9*  ALBUMIN 2.5*   No results for input(s): LIPASE, AMYLASE in the last 168 hours. No results for input(s): AMMONIA in the last 168 hours. Coagulation Profile: Recent Labs  Lab 06/16/17 0235  INR 1.45   Cardiac Enzymes: No results for input(s): CKTOTAL, CKMB, CKMBINDEX, TROPONINI in the last 168 hours. BNP (last 3 results) Recent Labs    01/11/17 1053  PROBNP 264.0*   HbA1C: No results for input(s): HGBA1C in the last 72 hours. CBG: Recent Labs  Lab 06/22/17 0435 06/22/17 0745 06/22/17 1228 06/22/17 1647 06/22/17 2024  GLUCAP 136* 116* 192* 184* 131*   Lipid Profile: No results for input(s): CHOL, HDL, LDLCALC, TRIG, CHOLHDL, LDLDIRECT in the last 72 hours. Thyroid Function Tests: No results for input(s): TSH, T4TOTAL, FREET4, T3FREE, THYROIDAB in the last 72 hours. Anemia Panel: No results for input(s):  VITAMINB12, FOLATE, FERRITIN, TIBC, IRON, RETICCTPCT in the last 72 hours. Urine analysis:    Component Value Date/Time   COLORURINE YELLOW 06/18/2017 1116   APPEARANCEUR CLEAR 06/18/2017 1116   LABSPEC 1.017 06/18/2017 1116   PHURINE 7.0 06/18/2017 1116   GLUCOSEU NEGATIVE 06/18/2017 1116   HGBUR NEGATIVE 06/18/2017 1116   BILIRUBINUR NEGATIVE 06/18/2017 1116   BILIRUBINUR neg. 06/28/2013 [place] 06/18/2017 1116   PROTEINUR NEGATIVE 06/18/2017 1116   UROBILINOGEN 0.2 06/28/2013 1041   UROBILINOGEN 0.2 07/30/2009 1435   NITRITE NEGATIVE 06/18/2017 1116   LEUKOCYTESUR NEGATIVE 06/18/2017 1116   Sepsis Labs: @LABRCNTIP (procalcitonin:4,lacticidven:4)  ) Recent Results (from the past 240 hour(s))  Culture, Urine     Status: None   Collection Time: 06/18/17 11:09 AM  Result Value Ref Range Status   Specimen Description URINE, CLEAN CATCH  Final   Special Requests NONE  Final   Culture NO GROWTH  Final   Report Status 06/19/2017 FINAL  Final         Radiology Studies: Dg Swallowing Func-speech Pathology  Result Date: 06/22/2017 Objective Swallowing Evaluation: Type of Study: MBS-Modified Barium Swallow Study  Patient Details Name: Kelli Velasquez MRN: [id number] Date of Birth: [birth year] Today's Date: 06/22/2017 Time: SLP Start Time (ACUTE ONLY): 1307 -SLP Stop Time (ACUTE ONLY): 1323 SLP Time Calculation (min) (ACUTE ONLY): 16 min Past Medical History: Past Medical History: Diagnosis Date . Allergy   allergic rhinitis . Asthma  . Chronic bronchitis ([place])  . Coronary artery disease   cath January 2011 with DEs LAD and RCA . Dyspnea  . Full dentures  . Gallstones  . GERD (gastroesophageal reflux disease)  . History of echocardiogram   Echo 5/17: mod LVH, EF 50-55%, ant-septal HK, Gr 1 DD, mod LAE //  b.  Echo 7/17: mild concentric LVH, EF 45-50%, inf-lat, inf, inf-septal HK, mild LAE . History of nuclear stress test   Myoview 7/17: EF 48%, small mild apical defect, no  ischemia, low risk . Hyperlipidemia  . Hypertension  . Myocardial infarction ([place])   subendocardial, initial episode, 2010 two stents placed . Myopia  . 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 Nocturnal oxygen desaturation   o2 at night . Obesity  . Osteoarthritis   knees, fingers, shoulders . Overactive bladder  . Oxygen deficiency   uses oxygen all day . Rash   and other non specific skin eruptions . Retaining fluid   in ankles and feet . Urinary incontinence  . Vertigo  . Wears glasses  Past Surgical History: Past Surgical History: Procedure Laterality Date .  CATARACT EXTRACTION W/PHACO Right 12/16/2016  Procedure: CATARACT EXTRACTION PHACO AND INTRAOCULAR LENS PLACEMENT ([place])  right;  Surgeon: Leandrew Koyanagi, MD;  Location: [facility];  Service: Ophthalmology;  Laterality: Right; . CATARACT EXTRACTION W/PHACO Left 02/03/2017  Procedure: CATARACT EXTRACTION PHACO AND INTRAOCULAR LENS PLACEMENT ([place])  Left  Complicated;  Surgeon: Leandrew Koyanagi, MD;  Location: [facility];  Service: Ophthalmology;  Laterality: Left;  Malyugin Uses oxygen  . CHOLECYSTECTOMY  92 . COLONOSCOPY   . CORONARY ANGIOGRAPHY N/A 06/16/2017  Procedure: CORONARY ANGIOGRAPHY;  Surgeon: Larey Dresser, MD;  Location: [facility];  Service: Cardiovascular;  Laterality: N/A; . CORONARY ANGIOPLASTY WITH STENT PLACEMENT  07/2009  stent . CORONARY STENT INTERVENTION N/A 06/16/2017  Procedure: CORONARY STENT INTERVENTION;  Surgeon: Wellington Hampshire, MD;  Location: [facility];  Service: Cardiovascular;  Laterality: N/A; . DILATION AND CURETTAGE OF UTERUS  1984 . INCISIONAL HERNIA REPAIR N/A 05/16/2013  Procedure: HERNIA REPAIR INFRAUMILICAL INCISIONAL;  Surgeon: Joyice Faster. Cornett, MD;  Location: [facility];  Service: General;  Laterality: N/A;  umbilical . INSERTION OF MESH N/A 05/16/2013  Procedure: INSERTION OF MESH;  Surgeon: Joyice Faster. Cornett, MD;  Location: [facility];  Service: General;  Laterality: N/A;  umbilical . JOINT REPLACEMENT  2007  right total knee replacement . RIGHT/LEFT HEART CATH AND CORONARY ANGIOGRAPHY N/A 06/16/2017  Procedure: RIGHT/LEFT HEART CATH AND CORONARY ANGIOGRAPHY;  Surgeon: Larey Dresser, MD;  Location: [facility];  Service: Cardiovascular;  Laterality: N/A; . TOTAL KNEE ARTHROPLASTY  2010  left , then vocal cord infection post op . VIDEO BRONCHOSCOPY Bilateral 07/05/2013  Procedure: VIDEO BRONCHOSCOPY WITH FLUORO;  Surgeon: Tanda Rockers, MD;  Location: [facility];  Service: Cardiopulmonary;  Laterality: Bilateral; . vocal cord polypectomy   HPI: 76yo female former smoker (quit 1984) with hx CAD, HTN, CHF, BOOP followed by Dr. Melvyn Novas on chronic prednisone presented 10/31 with progressive SOB, cough, wheezing, BLE edema. She had significant respiratory distress in ER and failed trial bipap requiring intubation 10/31-11/4. FEES 11/6 showed severe edema and weakness, standing secretions, and diffuse residue that could not be cleared. Recommendations at that time were to remain NPO except for a few ice chips given after oral care. Pt was reintubated 11/7-11/12 and stayed on BiPAP post-extubation. SLP was consulted to reassess swallowing.  Subjective: pt alert, remains confused but improving Assessment / Plan / Recommendation CHL IP CLINICAL IMPRESSIONS 06/22/2017 Clinical Impression Pt has improving oropharyngeal function as her overall respiratory status, phonation (glottal closure), and mentation continue to progress. She has what appears to be a cognitively-based oral dysphagia, marked by oral holding, lingual rocking, and mildly prolonged mastication. Her swallow triggers at the valleculae with all consistencies with brief pooling before the swallow. Her pharyngeal clearance is good, but mildly decreased hyolaryngeal movement and decreased glottal closure results in trace penetration of nectar thick liquids on the first  few cup sips. This cleared easily with a cued throat clear, and as the study progressed, her airway protection improved - even with consecutive cup/straw sips. She did still have deeper penetration to the vocal folds with thin liquids. She triggered a throat clear once it reached the vocal folds, which seemed to clear most but not all of the penetrates from her laryngeal vestibule. Given her sensitivity to aspiration this admission and her still altered mentation, recommend to start more conservatively with Dys 2 textures and nectar thick liquids. As she continues to progress  cognitively, and potentially as the NGT is removed, she has good prognosis for further advancement. SLP Visit Diagnosis Dysphagia, oropharyngeal phase (R13.12) Attention and concentration deficit following -- Frontal lobe and executive function deficit following -- Impact on safety and function Mild aspiration risk;Moderate aspiration risk   CHL IP TREATMENT RECOMMENDATION 06/22/2017 Treatment Recommendations Therapy as outlined in treatment plan below   Prognosis 06/22/2017 Prognosis for Safe Diet Advancement Good Barriers to Reach Goals Cognitive deficits Barriers/Prognosis Comment -- CHL IP DIET RECOMMENDATION 06/22/2017 SLP Diet Recommendations Dysphagia 2 (Fine chop) solids;Nectar thick liquid Liquid Administration via Cup;Straw Medication Administration Whole meds with puree Compensations Slow rate;Small sips/bites;Clear throat intermittently Postural Changes Seated upright at 90 degrees   CHL IP OTHER RECOMMENDATIONS 06/22/2017 Recommended Consults -- Oral Care Recommendations Oral care BID Other Recommendations --   CHL IP FOLLOW UP RECOMMENDATIONS 06/22/2017 Follow up Recommendations Skilled Nursing facility   [facility] IP FREQUENCY AND DURATION 06/22/2017 Speech Therapy Frequency (ACUTE ONLY) min 2x/week Treatment Duration 2 weeks      CHL IP ORAL PHASE 06/22/2017 Oral Phase Impaired Oral - Pudding Teaspoon -- Oral - Pudding Cup -- Oral -  Honey Teaspoon NT Oral - Honey Cup -- Oral - Nectar Teaspoon Delayed oral transit Oral - Nectar Cup Delayed oral transit Oral - Nectar Straw Delayed oral transit Oral - Thin Teaspoon NT Oral - Thin Cup Delayed oral transit Oral - Thin Straw -- Oral - Puree Delayed oral transit Oral - Mech Soft Impaired mastication Oral - Regular -- Oral - Multi-Consistency -- Oral - Pill -- Oral Phase - Comment --  CHL IP PHARYNGEAL PHASE 06/22/2017 Pharyngeal Phase Impaired Pharyngeal- Pudding Teaspoon -- Pharyngeal -- Pharyngeal- Pudding Cup -- Pharyngeal -- Pharyngeal- Honey Teaspoon NT Pharyngeal -- Pharyngeal- Honey Cup -- Pharyngeal -- Pharyngeal- Nectar Teaspoon Delayed swallow initiation-vallecula;Reduced anterior laryngeal mobility;Reduced laryngeal elevation;Reduced airway/laryngeal closure;Reduced tongue base retraction Pharyngeal Material does not enter airway Pharyngeal- Nectar Cup Delayed swallow initiation-vallecula;Reduced anterior laryngeal mobility;Reduced laryngeal elevation;Reduced airway/laryngeal closure;Reduced tongue base retraction;Penetration/Aspiration during swallow Pharyngeal Material enters airway, remains ABOVE vocal cords and not ejected out Pharyngeal- Nectar Straw Delayed swallow initiation-vallecula;Reduced anterior laryngeal mobility;Reduced laryngeal elevation;Reduced airway/laryngeal closure;Reduced tongue base retraction Pharyngeal -- Pharyngeal- Thin Teaspoon NT Pharyngeal -- Pharyngeal- Thin Cup Delayed swallow initiation-vallecula;Reduced anterior laryngeal mobility;Reduced laryngeal elevation;Reduced airway/laryngeal closure;Reduced tongue base retraction;Penetration/Aspiration during swallow Pharyngeal Material enters airway, remains ABOVE vocal cords and not ejected out;Material enters airway, CONTACTS cords and then ejected out Pharyngeal- Thin Straw -- Pharyngeal -- Pharyngeal- Puree Delayed swallow initiation-vallecula;Reduced anterior laryngeal mobility;Reduced laryngeal  elevation;Reduced airway/laryngeal closure;Reduced tongue base retraction;Pharyngeal residue - valleculae Pharyngeal -- Pharyngeal- Mechanical Soft Delayed swallow initiation-vallecula;Reduced anterior laryngeal mobility;Reduced laryngeal elevation;Reduced airway/laryngeal closure;Reduced tongue base retraction Pharyngeal -- Pharyngeal- Regular -- Pharyngeal -- Pharyngeal- Multi-consistency -- Pharyngeal -- Pharyngeal- Pill -- Pharyngeal -- Pharyngeal Comment --  CHL IP CERVICAL ESOPHAGEAL PHASE 06/22/2017 Cervical Esophageal Phase WFL Pudding Teaspoon -- Pudding Cup -- Honey Teaspoon -- Honey Cup -- Nectar Teaspoon -- Nectar Cup -- Nectar Straw -- Thin Teaspoon -- Thin Cup -- Thin Straw -- Puree -- Mechanical Soft -- Regular -- Multi-consistency -- Pill -- Cervical Esophageal Comment -- No flowsheet data found. [name] 06/22/2017, 1:56 PM  [name], M.A. CCC-SLP [phone]                  Scheduled Meds: . apixaban  10 mg Oral BID   Followed by  . [START ON 06/25/2017] apixaban  5 mg Oral BID  . bisoprolol  2.5 mg  Oral Daily  . chlorhexidine  15 mL Mouth Rinse BID  . clopidogrel  75 mg Oral Daily  . cyanocobalamin  1,000 mcg Subcutaneous Daily  . ezetimibe  10 mg Oral QHS  . feeding supplement (PRO-STAT SUGAR FREE 64)  30 mL Per Tube TID  . [START ON 55/97/4163] folic acid  1 mg Oral Daily  . free water  375 mL Per Tube Q8H  . furosemide  40 mg Oral Daily  . hydrALAZINE  10 mg Oral Q8H  . insulin aspart  0-20 Units Subcutaneous Q4H  . insulin glargine  26 Units Subcutaneous Daily  . ipratropium-albuterol  3 mL Nebulization TID  . isosorbide dinitrate  10 mg Oral TID  . losartan  25 mg Oral BID  . mouth rinse  15 mL Mouth Rinse q12n4p  . montelukast  10 mg Oral QHS  . [START ON 06/23/2017] pantoprazole  40 mg Oral Daily  . pravastatin  40 mg Oral q1800  . [START ON 06/23/2017] predniSONE  10 mg Oral Q breakfast  . [START ON 06/23/2017] spironolactone  25 mg Oral  Daily   Continuous Infusions: . feeding supplement (JEVITY 1.2 CAL) 1,000 mL (06/22/17 1800)     LOS: 27 days    Time spent: 15 minutes    Edwin Dada, MD [facility] Pager [phone]  If 7PM-7AM, please contact night-coverage [email or web address] Password [email or web address] 06/22/2017, 9:59 PM

## 2017-06-23 DIAGNOSIS — I5043 Acute on chronic combined systolic (congestive) and diastolic (congestive) heart failure: Secondary | ICD-10-CM

## 2017-06-23 DIAGNOSIS — G4733 Obstructive sleep apnea (adult) (pediatric): Secondary | ICD-10-CM

## 2017-06-23 DIAGNOSIS — R001 Bradycardia, unspecified: Secondary | ICD-10-CM

## 2017-06-23 LAB — BASIC METABOLIC PANEL
ANION GAP: 9 (ref 5–15)
BUN: 25 mg/dL — ABNORMAL HIGH (ref 6–20)
CALCIUM: 9.2 mg/dL (ref 8.9–10.3)
CHLORIDE: 91 mmol/L — AB (ref 101–111)
CO2: 39 mmol/L — AB (ref 22–32)
CREATININE: 0.68 mg/dL (ref 0.44–1.00)
GFR calc Af Amer: >60 mL/min (ref >60)
GFR calc non Af Amer: >60 mL/min (ref >60)
GLUCOSE: 122 mg/dL — AB (ref 65–99)
Potassium: 4.1 mmol/L (ref 3.5–5.1)
Sodium: 139 mmol/L (ref 135–145)

## 2017-06-23 LAB — GLUCOSE, CAPILLARY
GLUCOSE-CAPILLARY: 101 mg/dL — AB (ref 65–99)
GLUCOSE-CAPILLARY: 116 mg/dL — AB (ref 65–99)
GLUCOSE-CAPILLARY: 179 mg/dL — AB (ref 65–99)
GLUCOSE-CAPILLARY: 140 mg/dL — AB (ref 65–99)
GLUCOSE-CAPILLARY: 125 mg/dL — AB (ref 65–99)

## 2017-06-23 MED ORDER — SILVER NITRATE-POT NITRATE 75-25 % EX MISC
1.0000 | Freq: Once | CUTANEOUS | Status: DC | PRN
  Filled 2017-06-23: qty 1

## 2017-06-23 MED ORDER — LOSARTAN POTASSIUM 50 MG PO TABS: 50.0000 mg | ORAL_TABLET | Freq: Two times a day (BID) | ORAL | Status: DC

## 2017-06-23 MED ORDER — JEVITY 1.2 CAL PO LIQD
1000.0000 mL | ORAL | Status: DC
  Administered 2017-06-23: 1000 mL
  Filled 2017-06-23 (×6): qty 1000

## 2017-06-23 MED ORDER — LIDOCAINE HCL 2 % EX GEL
1.0000 "application " | Freq: Once | CUTANEOUS | Status: DC | PRN
  Filled 2017-06-23: qty 5

## 2017-06-23 MED ORDER — LIDOCAINE-EPINEPHRINE (PF) 1 %-1:200000 IJ SOLN
0.0000 mL | Freq: Once | INTRAMUSCULAR | Status: DC | PRN
  Filled 2017-06-23: qty 30

## 2017-06-23 MED ORDER — LOSARTAN POTASSIUM 50 MG PO TABS
25.0000 mg | ORAL_TABLET | Freq: Once | ORAL | Status: AC
  Administered 2017-06-23: 25 mg via ORAL
  Filled 2017-06-23: qty 1

## 2017-06-23 MED ORDER — OXYMETAZOLINE HCL 0.05 % NA SOLN
1.0000 | Freq: Once | NASAL | Status: DC | PRN
  Filled 2017-06-23: qty 15

## 2017-06-23 MED ORDER — LIDOCAINE HCL 4 % EX SOLN
0.0000 mL | Freq: Once | CUTANEOUS | Status: DC | PRN
  Filled 2017-06-23: qty 50

## 2017-06-23 MED ORDER — SACUBITRIL-VALSARTAN 24-26 MG PO TABS
1.0000 | ORAL_TABLET | Freq: Two times a day (BID) | ORAL | Status: DC
  Administered 2017-06-23 – 2017-06-25 (×4): 1 via ORAL
  Filled 2017-06-23 (×4): qty 1

## 2017-06-23 MED ORDER — TRIPLE ANTIBIOTIC 3.5-400-5000 EX OINT
1.0000 "application " | TOPICAL_OINTMENT | Freq: Once | CUTANEOUS | Status: DC | PRN
  Filled 2017-06-23: qty 1

## 2017-06-23 NOTE — Progress Notes (Signed)
Nutrition Follow-up  DOCUMENTATION CODES:   Morbid obesity  INTERVENTION:   -Transition pt to nocturnal feedings: Jevity 1.2 @ 65 ml/hr via cortrak tube over 12 hour period   D/c 30 ml Prostat TID.    Pt receiving 375 ml free water flush every 8 hours  Tube feeding regimen provides 936 kcal (55% of needs), 43 grams of protein, and 1754 ml of H2O.   NUTRITION DIAGNOSIS:   Inadequate oral intake related to dysphagia, lethargy/confusion as evidenced by meal completion < 50%(TF dependent).  Ongoing  GOAL:   Patient will meet greater than or equal to 90% of their needs  Progressing   MONITOR:   PO intake, Diet advancement, Labs, Weight trends, TF tolerance, Skin, I & O's  REASON FOR ASSESSMENT:   Consult Assessment of nutrition requirement/status, Enteral/tube feeding initiation and management  ASSESSMENT:   76 yo female with PMH of HLD, HTN, BOOP, HF, obesity, vertigo, asthma, CAD, GERD, fish and shellfish allergy (per husband she has anaphylaxis) who was admitted on 10/31 with respiratory failure related to CHF.  11/20- failed FEES 11/22- hypoglycemic event 11/26- s/p MBSS- advanced to dysphagia 2 diet with nectar thick liquids  Spoke with pt, who was alert and oriented at time of visit. She reports that her swallowing has improved and recalls eating breakfast this AM. Sh estimates she consumed about 50% of oatmeal and eggs. Per discussion with RN, pt is tolerating liquids well and also consumed an entire container of applesauce this AM.   Pt continues to receive TF via cortrak tube- Jevity 1.2 infusing at goal rate of 55 ml/hr with 30 ml Prostat TID; also receiving 375 ml free water flush every 8 hours. Regimen providing 1884 kcals ,118 grams of protein, and 2196 ml free water daily, which meets 100% of estimated kcal and protein needs.   Per RN, pt is much more alert and taking more PO's today. Eventual plan is to d/c cortrak tube prior to d/c to SNF. Discussed with RN  RD recommendation of transitioning pt to nocturnal feedings to assist with stimulating appetite to promote increased PO intake.   Labs reviewed: CBGS: 101-131 (inpatient orders for glycemic control are 0-20 units insulin aspart every 4 hours ans 26 units insulin glargine daily).   Diet Order:  DIET DYS 2 Room service appropriate? Yes; Fluid consistency: Nectar Thick  EDUCATION NEEDS:   No education needs have been identified at this time  Skin:  Skin Assessment: Reviewed RN Assessment  Last BM:  06/23/17  Height:   Ht Readings from Last 1 Encounters:  05/31/17 5\' 4"  (1.626 m)    Weight:   Wt Readings from Last 1 Encounters:  06/22/17 233 lb 7.5 oz (105.9 kg)    Ideal Body Weight:  54.5 kg  BMI:  Body mass index is 40.07 kg/m.  Estimated Nutritional Needs:   Kcal:  1700-1900  Protein:  110-130 gm  Fluid:  1.7-1.9 L    Ramesses Crampton A. Jimmye Norman, RD, LDN, CDE Pager: [phone] After hours Pager: [phone]

## 2017-06-23 NOTE — Progress Notes (Addendum)
PULMONARY / CRITICAL CARE MEDICINE   Name: Kelli Velasquez MRN: [id number] DOB: 05-03-1941    ADMISSION DATE:  05/26/2017  REFERRING MD:  Dr. Wyvonnia Dusky  CHIEF COMPLAINT:  SOB  HISTORY OF PRESENT ILLNESS:   76 yo female former smoker presented with progressive dyspnea, cough, wheeze, and edema.  Required intubation for respiratory failure from CHF, A fib and MSSA PNA. PMHx of CAD, HTN, combined CHF, and ? 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 Melvyn Novas).  SUBJECTIVE:  On home O2 dose 2 liters On pred 10 No distress flat in bed  VITAL SIGNS: BP (!) 113/41 (BP Location: Left Leg)   Pulse 72   Temp 99.9 F (37.7 C) (Oral)   Resp 18   Ht _0  (1.626 m)   Wt 105.9 kg (233 lb 7.5 oz)   LMP 07/27/1980   SpO2 94%   BMI 40.07 kg/m   INTAKE / OUTPUT:    Intake/Output Summary (Last 24 hours) at 06/23/2017 1407 Last data filed at 06/23/2017 1344 Gross per 24 hour  Intake 1170.33 ml  Output 950 ml  Net 220.33 ml     PHYSICAL EXAMINATION: General: awake, alert, no distress Neuro: Awakens FC HEENT: obese PULM: CTA distant, reduced bases CV:  s1 s2 RRR distant GI: soft, BS wnl no r Extremities:  No edema     LABS:  BMET Recent Labs  Lab 06/21/17 0332 06/22/17 0337 06/23/17 0422  NA 140 142 139  K 4.0 3.8 4.1  CL 96* 94* 91*  CO2 39* 40* 39*  BUN 25* 27* 25*  CREATININE 0.71 0.67 0.68  GLUCOSE 113* 132* 122*    Electrolytes Recent Labs  Lab 06/19/17 0330  06/21/17 0332 06/22/17 0337 06/23/17 0422  CALCIUM 8.4*   < > 8.5* 9.0 9.2  MG 2.1  --   --   --   --    < > = values in this interval not displayed.    CBC Recent Labs  Lab 06/18/17 0529 06/19/17 0330 06/20/17 0427  WBC 12.5* 11.2* 11.3*  HGB 11.1* 9.7* 9.5*  HCT 37.3 32.2* 32.0*  PLT 124* 103* 109*    Liver Enzymes No results for input(s): AST, ALT, ALKPHOS, BILITOT, ALBUMIN in the last 168 hours.  Glucose Recent Labs  Lab 06/22/17 1647 06/22/17 2024 06/22/17 2357  06/23/17 0347 06/23/17 0748 06/23/17 1216  GLUCAP 184* 131* 119* 101* 116* 179*    Imaging No results found.  STUDIES:  Echo 10/31>>> EF 35-40%, diffuse hypokinesis CT chest 11/7 read pending > RLL consolidation, no appreciable effusion    CULTURES: BAL AFB 11/07 >> neg smear >>> BAL PCP 11/07 >> negative BAL fungal 11/07 >>  BAL 11/07 >> oral flora Sputum 11/07 >> MSSA Blood 11/07 >> negative  ANTIBIOTICS: Tressie Ellis 11/05 >> 11/10 Vancomycin 11/05 >> 11/07 Rocephin 11/10 >> 11/16  SIGNIFICANT EVENTS: 10/31 Admit 11/04 extubate 11/05 transfer SDU 11/07 transfer ICU reintubated. 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  ASSESSMENT / PLAN:   Presumed sleep disordered breathing/ hypercarbia related to OHS - [place] wnll without BIPAP assumed 11/23 and 11/27 -re evaluate sleep study as outpt  MSSA PNA. - Completed ABX 11/16 -follow fever curve  Hx of BOOP. -  Continue prednisone 78m daily, she typically as outpt will exacerbate off prednisone -would dc home on pred 10, follow up with Dr WMelvyn Novas post dc -likely as outptu to 5 mg, but slowly  Will sign off, call if needed  DLavon Paganini FTitus Mould MD, FWoodlawnPgr: [facility]  Additional d/w Dr HOlin Piaovernight She certainly looks high risk for OSA and her baseline bicarb on chem is 39-40 = OHS With this brady , regardless of abg off NIMV Would empirically treat autoset BIPAP Have follow up Dr WMelvyn Novaswith Sleep assessment  DLavon Paganini FTitus Mould MD, FMilfordPgr: [facility]

## 2017-06-23 NOTE — Progress Notes (Addendum)
PROGRESS NOTE    Kelli Velasquez  IHK:[id number] DOB: [birth year] DOA: 05/26/2017 PCP: Abner Greenspan, MD      Brief Narrative:  76 yo F with CHF, A. fib, CAD, HTN, BOOP admitted on 10/31 with SOB, was found to have MSSA pneumonia with acute respiratory failure --> intubation and extubated on 05/30/2017.  Later patient had another episode of respiratory distress requiring repeat intubation on 06/02/2017.  Finally extubated on 06/07/2017, but remained aphonic, delirious after second extubation. Bronchoscopy performed on 06/02/2017, culture grew MSSA patient completed antibiotic course on 06/11/2017.   Also was being treated for acute systolic CHF, Cardiology consulted and diuresed with IV Lasix, ultimately went for cardiac cath.  Cath 11/21 showed single vessel disease, stented.  Currently plan is continue monitoring for improvement in swalllowing, voice quality so we can remove the Cortrak and discharge to SNF.    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 Acute hypoxic respiratory failure from CHF, coronary ischemia Patient initially presented with respiratory failure, required intubation, treated with aggressive diuresis for CHF and Solu-medrol for BOOP.  Extubated after 5 days, but failed after 4 days and was reintubated from 11/7-11/12.  During second intubation developed MSSA pneumonia, now resolved.  Now off antibiotics, diuretics scaled back and back to home level O2 (2 L/m), home steroids from Pulmonary standpoint. Respiratory failure is mostly  now resolved, back on home O2. -Continue Lasix daily oral -Monitor I/Os closely  - As per pulmonology follow-up, continue prednisone 10 MG daily, she typically is on as outpatient and will exacerbate off prednisone. They recommended discharging home on prednisone 10 MG daily until follow-up with Dr. Melvyn Novas post discharge. - I personally discussed with Dr. Titus Mould, pulmonology on 11/27. Patient has had periods of bradycardia in the 30s in the early morning hours. As discussed with Dr. Benjamine Mola, cardiology, suspect this is due to sleep apnea. All agree and patient to be placed on AutoSet BiPAP at bedtime until formal outpatient sleep study.   2. Delirium: From hyperNa, hypercarbia, hypoxia, and ICU delirium. Improved greatly over the last few days. -Consulted Palliative Care, for help coordinating discharge planning, overall goals of care, appreciate input, they have begun to scale back as paitent has improved in last week. Continue delirium precautions:   -Lights and TV off, minimize interruptions at night  -Blinds open and lights on during day  -Glasses/hearing aid with patient  -Frequent reorientation  -PT/OT when able  -Avoid sedation medications/Beers list medications - Still appears slightly confused at times but as per RN, significantly better compared to a few days ago.  3. Dysphagia and aphonia Family have indicated PEG is not within patient's goals of care. MBS allowed patient to advance to DYS 2 and nectar thick. -Maintain Cortrak for now -SLP consult appreciated - As per RN, patient eating more. Based on trend, may consider DC core track in the next couple days if she eats well.  4. Hearing loss: Patient without prior hearing loss.  ?now if she is hard of hearing, or has persistent  hypoactive delirium.   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  5.  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 fib with RVR, and CAD.  Echo showed EF 35%. Cath 11/21 showed single vessel disease, stented.   -Consult cardiology, appreciate recommendations -Continue home oral Lasix -Continue Plavix -Continue Isordil, Spironolactone per Cardiology -Continue BB, hydralazine -Antihypertensive doses were reduced given hypotension, bradycardia - Patient started on Entresto 24/26 bid on 11/28, losartan stopped. Cardiology following and adjusting medications and plan to stop hydralazine and Isordil on 11/29. - Volume status stable.  6. History of BOOP -Continue prednisone back to baseline . Pulmonology signed off 11/28. Steroid recommendations as above.  7. MSSA pneumonia: Completed antibiotics  8. Diabetes -Continue SSI. Reasonable inpatient control.  9. Severe protein calorie malnutrition -Continue tube feeds per NG. Apparently eating better and plan to DC core track when eating well consistently.  10. RUE DVT: UE DVT in setting of PICC.  Thrombocytopenia with negative HIT panel, avoiding heparins. -Continue Eliquis  11. Hypernatremia Resolved -Free water flushes -Daily BMP  12. Macrocytic anemia Mild. Stable.  13. Hypokalemia Mag nl. -Supplement. Resolved.  14. CAD: Status post DES to LAD and RCA 2011. Cardiac cath 11/21 with severe mid RCA stenosis treated with DES. Cardiology suspects that this may have triggered ischemic flash pulmonary edema. She got Brillinta with cath but transitioned to Plavix with no aspirin (as she will need ongoing anticoagulation for RUE DVT). Has not tolerated Zocor or Crestor. Continue pravastatin 40 MG daily.  15. Acute kidney injury: Resolved.  16. Essential hypertension:  Fluctuating and mildly uncontrolled at times. Meds as above. Monitor.  17. NSVT: Remains on bisoprolol.  18. Bradycardia, periodic: Noted sinus bradycardia in the 30s overnight/early morning hours on 11/27 and 11/28. Discussed in detail with pulmonology and cardiology. Suspect related to sleep apnea. Start nightly BiPAP with AutoSet.  19. Suspected OSA/OHS: BiPAP overnight.  20. Anemia: Stable. Follow CBC periodically.     DVT prophylaxis: Eliquis Code Status: DNR Family Communication: None at bedside. Disposition Plan: Prolonged delirium, delaying ability to swallow, although much clearer in last few days, progressed to DYS 2 diet.  Continue to work with SLP, PT.  Hopefully to SNF by end of week.    Consultants:   Cardiology  CCM-signed off 11/28  Palliative  Procedures:   Echocardiogram  Bronchoscopy  Intubation  Antimicrobials:   Ceftazidime 11/6 >> 11/10  Ceftriaxone 11/10 >> 11/16    Subjective: "I feel good". Denies complaints. No chest pain, dyspnea or cough reported. Does appear mildly confused at times. As per RN, doing much better compared to a few days ago and eating much better as well. No acute issues reported.  Objective: Vitals:   06/23/17 0700 06/23/17 0851 06/23/17 1349 06/23/17 1354  BP: (!) 150/68  (!) 113/41   Pulse:   72   Resp:      Temp:   99.9 F (37.7 C)   TempSrc:   Oral   SpO2:  97% 97% 94%  Weight:      Height:        Intake/Output Summary (Last 24 hours) at 06/23/2017 1412 Last data  filed at 06/23/2017 1344 Gross per 24 hour  Intake 1170.33 ml  Output 950 ml  Net 220.33 ml   Filed Weights   06/21/17 0326 06/22/17 0300 06/22/17 1127  Weight: 107.5 kg (237 lb) 113.9 kg (251 lb) 105.9 kg (233 lb 7.5 oz)    Examination: General appearance: Elderly obese adult female, sitting up comfortably in bed. Does not appear in any distress. HEENT: Voice clearer, more forceful, still hoarse . NG tube in right nostril. Oxygen via  nasal cannula. Skin: Warm and dry.  Many bruises.  Puffy. Cardiac: S1 and S2 heard, RRR. No JVD. Trace bilateral ankle edema. No murmurs. Telemetry: Mostly sinus rhythm. Occasional transient sinus bradycardia in the 30s noted early yesterday morning and this morning. Respiratory: Diminished breath sounds in the bases with occasional basal crackles. Rest of the lung fields clear to auscultation. No increased work of breathing. Abdomen: Nondistended, soft and nontender. Normal bowel sounds heard. Neuro: Alert and oriented to person and place and partly to time. No focal neurological deficits. Psych: Pleasant and interactive. Answers questions appropriately.      Data Reviewed: I have personally reviewed following labs and imaging studies:  CBC: Recent Labs  Lab 06/17/17 0322 06/18/17 0529 06/19/17 0330 06/20/17 0427  WBC 10.1 12.5* 11.2* 11.3*  NEUTROABS 8.1* 10.1* 8.7* 8.6*  HGB 10.6* 11.1* 9.7* 9.5*  HCT 35.6* 37.3 32.2* 32.0*  MCV 100.8* 98.7 99.7 100.3*  PLT 112* 124* 103* 161*   Basic Metabolic Panel: Recent Labs  Lab 06/19/17 0330 06/20/17 0427 06/21/17 0332 06/22/17 0337 06/23/17 0422  NA 143 138 140 142 139  K 2.9* 3.9 4.0 3.8 4.1  CL 99* 97* 96* 94* 91*  CO2 36* 36* 39* 40* 39*  GLUCOSE 116* 133* 113* 132* 122*  BUN 22* 21* 25* 27* 25*  CREATININE 0.71 0.66 0.71 0.67 0.68  CALCIUM 8.4* 8.4* 8.5* 9.0 9.2  MG 2.1  --   --   --   --    GFR: Estimated Creatinine Clearance: 69.9 mL/min (by C-G formula based on SCr of 0.68 mg/dL). Liver Function Tests: No results for input(s): AST, ALT, ALKPHOS, BILITOT, PROT, ALBUMIN in the last 168 hours. No results for input(s): LIPASE, AMYLASE in the last 168 hours. No results for input(s): AMMONIA in the last 168 hours. Coagulation Profile: No results for input(s): INR, PROTIME in the last 168 hours. Cardiac Enzymes: No results for input(s): CKTOTAL, CKMB, CKMBINDEX, TROPONINI in the last 168 hours. BNP (last 3  results) Recent Labs    01/11/17 1053  PROBNP 264.0*   HbA1C: No results for input(s): HGBA1C in the last 72 hours. CBG: Recent Labs  Lab 06/22/17 2024 06/22/17 2357 06/23/17 0347 06/23/17 0748 06/23/17 1216  GLUCAP 131* 119* 101* 116* 179*   Lipid Profile: No results for input(s): CHOL, HDL, LDLCALC, TRIG, CHOLHDL, LDLDIRECT in the last 72 hours. Thyroid Function Tests: No results for input(s): TSH, T4TOTAL, FREET4, T3FREE, THYROIDAB in the last 72 hours. Anemia Panel: No results for input(s): VITAMINB12, FOLATE, FERRITIN, TIBC, IRON, RETICCTPCT in the last 72 hours. Urine analysis:    Component Value Date/Time   COLORURINE YELLOW 06/18/2017 1116   APPEARANCEUR CLEAR 06/18/2017 1116   LABSPEC 1.017 06/18/2017 1116   PHURINE 7.0 06/18/2017 1116   GLUCOSEU NEGATIVE 06/18/2017 1116   HGBUR NEGATIVE 06/18/2017 1116   [place] 06/18/2017 1116   BILIRUBINUR neg. 06/28/2013 [place] 06/18/2017 1116   PROTEINUR NEGATIVE 06/18/2017 1116   UROBILINOGEN 0.2 06/28/2013  1041   UROBILINOGEN 0.2 07/30/2009 1435   NITRITE NEGATIVE 06/18/2017 1116   LEUKOCYTESUR NEGATIVE 06/18/2017 1116    Recent Results (from the past 240 hour(s))  Culture, Urine     Status: None   Collection Time: 06/18/17 11:09 AM  Result Value Ref Range Status   Specimen Description URINE, CLEAN CATCH  Final   Special Requests NONE  Final   Culture NO GROWTH  Final   Report Status 06/19/2017 FINAL  Final         Radiology Studies: Dg Swallowing Func-speech Pathology  Result Date: 06/22/2017 Objective Swallowing Evaluation: Type of Study: MBS-Modified Barium Swallow Study  Patient Details Name: INNOCENCE SCHLOTZHAUER MRN: [id number] Date of Birth: 03-30-1941 Today's Date: 06/22/2017 Time: SLP Start Time (ACUTE ONLY): 1307 -SLP Stop Time (ACUTE ONLY): 1323 SLP Time Calculation (min) (ACUTE ONLY): 16 min Past Medical History: Past Medical History: Diagnosis Date . Allergy   allergic  rhinitis . Asthma  . Chronic bronchitis ([place])  . Coronary artery disease   cath January 2011 with DEs LAD and RCA . Dyspnea  . Full dentures  . Gallstones  . GERD (gastroesophageal reflux disease)  . History of echocardiogram   Echo 5/17: mod LVH, EF 50-55%, ant-septal HK, Gr 1 DD, mod LAE //  b.  Echo 7/17: mild concentric LVH, EF 45-50%, inf-lat, inf, inf-septal HK, mild LAE . History of nuclear stress test   Myoview 7/17: EF 48%, small mild apical defect, no ischemia, low risk . Hyperlipidemia  . Hypertension  . Myocardial infarction ([place])   subendocardial, initial episode, 2010 two stents placed . Myopia  . 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 Nocturnal oxygen desaturation   o2 at night . Obesity  . Osteoarthritis   knees, fingers, shoulders . Overactive bladder  . Oxygen deficiency   uses oxygen all day . Rash   and other non specific skin eruptions . Retaining fluid   in ankles and feet . Urinary incontinence  . Vertigo  . Wears glasses  Past Surgical History: Past Surgical History: Procedure Laterality Date . CATARACT EXTRACTION W/PHACO Right 12/16/2016  Procedure: CATARACT EXTRACTION PHACO AND INTRAOCULAR LENS PLACEMENT ([place])  right;  Surgeon: Leandrew Koyanagi, MD;  Location: [facility];  Service: Ophthalmology;  Laterality: Right; . CATARACT EXTRACTION W/PHACO Left 02/03/2017  Procedure: CATARACT EXTRACTION PHACO AND INTRAOCULAR LENS PLACEMENT ([place])  Left  Complicated;  Surgeon: Leandrew Koyanagi, MD;  Location: [facility];  Service: Ophthalmology;  Laterality: Left;  [place] Uses oxygen  . CHOLECYSTECTOMY  92 . COLONOSCOPY   . CORONARY ANGIOGRAPHY N/A 06/16/2017  Procedure: CORONARY ANGIOGRAPHY;  Surgeon: Larey Dresser, MD;  Location: [facility];  Service: Cardiovascular;  Laterality: N/A; . CORONARY ANGIOPLASTY WITH STENT PLACEMENT  07/2009  stent . CORONARY STENT INTERVENTION N/A 06/16/2017  Procedure: CORONARY STENT INTERVENTION;  Surgeon: Wellington Hampshire, MD;  Location: [facility];  Service: Cardiovascular;  Laterality: N/A; . DILATION AND CURETTAGE OF UTERUS  1984 . INCISIONAL HERNIA REPAIR N/A 05/16/2013  Procedure: HERNIA REPAIR INFRAUMILICAL INCISIONAL;  Surgeon: Joyice Faster. Cornett, MD;  Location: [facility];  Service: General;  Laterality: N/A;  umbilical . INSERTION OF MESH N/A 05/16/2013  Procedure: INSERTION OF MESH;  Surgeon: Joyice Faster. Cornett, MD;  Location: [facility];  Service: General;  Laterality: N/A;  umbilical . JOINT REPLACEMENT  2007  right total knee replacement . RIGHT/LEFT HEART CATH AND CORONARY ANGIOGRAPHY N/A  06/16/2017  Procedure: RIGHT/LEFT HEART CATH AND CORONARY ANGIOGRAPHY;  Surgeon: Larey Dresser, MD;  Location: [facility];  Service: Cardiovascular;  Laterality: N/A; . TOTAL KNEE ARTHROPLASTY  2010  left , then vocal cord infection post op . VIDEO BRONCHOSCOPY Bilateral 07/05/2013  Procedure: VIDEO BRONCHOSCOPY WITH FLUORO;  Surgeon: Tanda Rockers, MD;  Location: [facility];  Service: Cardiopulmonary;  Laterality: Bilateral; . vocal cord polypectomy   HPI: 76yo female former smoker (quit 1984) with hx CAD, HTN, CHF, BOOP followed by Dr. Melvyn Novas on chronic prednisone presented 10/31 with progressive SOB, cough, wheezing, BLE edema. She had significant respiratory distress in ER and failed trial bipap requiring intubation 10/31-11/4. FEES 11/6 showed severe edema and weakness, standing secretions, and diffuse residue that could not be cleared. Recommendations at that time were to remain NPO except for a few ice chips given after oral care. Pt was reintubated 11/7-11/12 and stayed on BiPAP post-extubation. SLP was consulted to reassess swallowing.  Subjective: pt alert, remains confused but improving Assessment / Plan / Recommendation CHL IP CLINICAL IMPRESSIONS 06/22/2017 Clinical Impression Pt has improving oropharyngeal function as her overall respiratory status, phonation  (glottal closure), and mentation continue to progress. She has what appears to be a cognitively-based oral dysphagia, marked by oral holding, lingual rocking, and mildly prolonged mastication. Her swallow triggers at the valleculae with all consistencies with brief pooling before the swallow. Her pharyngeal clearance is good, but mildly decreased hyolaryngeal movement and decreased glottal closure results in trace penetration of nectar thick liquids on the first few cup sips. This cleared easily with a cued throat clear, and as the study progressed, her airway protection improved - even with consecutive cup/straw sips. She did still have deeper penetration to the vocal folds with thin liquids. She triggered a throat clear once it reached the vocal folds, which seemed to clear most but not all of the penetrates from her laryngeal vestibule. Given her sensitivity to aspiration this admission and her still altered mentation, recommend to start more conservatively with Dys 2 textures and nectar thick liquids. As she continues to progress cognitively, and potentially as the NGT is removed, she has good prognosis for further advancement. SLP Visit Diagnosis Dysphagia, oropharyngeal phase (R13.12) Attention and concentration deficit following -- Frontal lobe and executive function deficit following -- Impact on safety and function Mild aspiration risk;Moderate aspiration risk   CHL IP TREATMENT RECOMMENDATION 06/22/2017 Treatment Recommendations Therapy as outlined in treatment plan below   Prognosis 06/22/2017 Prognosis for Safe Diet Advancement Good Barriers to Reach Goals Cognitive deficits Barriers/Prognosis Comment -- CHL IP DIET RECOMMENDATION 06/22/2017 SLP Diet Recommendations Dysphagia 2 (Fine chop) solids;Nectar thick liquid Liquid Administration via Cup;Straw Medication Administration Whole meds with puree Compensations Slow rate;Small sips/bites;Clear throat intermittently Postural Changes Seated upright at 90  degrees   CHL IP OTHER RECOMMENDATIONS 06/22/2017 Recommended Consults -- Oral Care Recommendations Oral care BID Other Recommendations --   CHL IP FOLLOW UP RECOMMENDATIONS 06/22/2017 Follow up Recommendations Skilled Nursing facility   [facility] IP FREQUENCY AND DURATION 06/22/2017 Speech Therapy Frequency (ACUTE ONLY) min 2x/week Treatment Duration 2 weeks      CHL IP ORAL PHASE 06/22/2017 Oral Phase Impaired Oral - Pudding Teaspoon -- Oral - Pudding Cup -- Oral - Honey Teaspoon NT Oral - Honey Cup -- Oral - Nectar Teaspoon Delayed oral transit Oral - Nectar Cup Delayed oral transit Oral - Nectar Straw Delayed oral transit Oral - Thin Teaspoon NT Oral - Thin Cup Delayed oral transit  Oral - Thin Straw -- Oral - Puree Delayed oral transit Oral - Mech Soft Impaired mastication Oral - Regular -- Oral - Multi-Consistency -- Oral - Pill -- Oral Phase - Comment --  CHL IP PHARYNGEAL PHASE 06/22/2017 Pharyngeal Phase Impaired Pharyngeal- Pudding Teaspoon -- Pharyngeal -- Pharyngeal- Pudding Cup -- Pharyngeal -- Pharyngeal- Honey Teaspoon NT Pharyngeal -- Pharyngeal- Honey Cup -- Pharyngeal -- Pharyngeal- Nectar Teaspoon Delayed swallow initiation-vallecula;Reduced anterior laryngeal mobility;Reduced laryngeal elevation;Reduced airway/laryngeal closure;Reduced tongue base retraction Pharyngeal Material does not enter airway Pharyngeal- Nectar Cup Delayed swallow initiation-vallecula;Reduced anterior laryngeal mobility;Reduced laryngeal elevation;Reduced airway/laryngeal closure;Reduced tongue base retraction;Penetration/Aspiration during swallow Pharyngeal Material enters airway, remains ABOVE vocal cords and not ejected out Pharyngeal- Nectar Straw Delayed swallow initiation-vallecula;Reduced anterior laryngeal mobility;Reduced laryngeal elevation;Reduced airway/laryngeal closure;Reduced tongue base retraction Pharyngeal -- Pharyngeal- Thin Teaspoon NT Pharyngeal -- Pharyngeal- Thin Cup Delayed swallow  initiation-vallecula;Reduced anterior laryngeal mobility;Reduced laryngeal elevation;Reduced airway/laryngeal closure;Reduced tongue base retraction;Penetration/Aspiration during swallow Pharyngeal Material enters airway, remains ABOVE vocal cords and not ejected out;Material enters airway, CONTACTS cords and then ejected out Pharyngeal- Thin Straw -- Pharyngeal -- Pharyngeal- Puree Delayed swallow initiation-vallecula;Reduced anterior laryngeal mobility;Reduced laryngeal elevation;Reduced airway/laryngeal closure;Reduced tongue base retraction;Pharyngeal residue - valleculae Pharyngeal -- Pharyngeal- Mechanical Soft Delayed swallow initiation-vallecula;Reduced anterior laryngeal mobility;Reduced laryngeal elevation;Reduced airway/laryngeal closure;Reduced tongue base retraction Pharyngeal -- Pharyngeal- Regular -- Pharyngeal -- Pharyngeal- Multi-consistency -- Pharyngeal -- Pharyngeal- Pill -- Pharyngeal -- Pharyngeal Comment --  CHL IP CERVICAL ESOPHAGEAL PHASE 06/22/2017 Cervical Esophageal Phase WFL Pudding Teaspoon -- Pudding Cup -- Honey Teaspoon -- Honey Cup -- Nectar Teaspoon -- Nectar Cup -- Nectar Straw -- Thin Teaspoon -- Thin Cup -- Thin Straw -- Puree -- Mechanical Soft -- Regular -- Multi-consistency -- Pill -- Cervical Esophageal Comment -- No flowsheet data found. [name] 06/22/2017, 1:56 PM  [name], M.A. CCC-SLP [phone]                  Scheduled Meds: . apixaban  10 mg Oral BID   Followed by  . [START ON 06/25/2017] apixaban  5 mg Oral BID  . chlorhexidine  15 mL Mouth Rinse BID  . clopidogrel  75 mg Oral Daily  . cyanocobalamin  1,000 mcg Subcutaneous Daily  . ezetimibe  10 mg Oral QHS  . folic acid  1 mg Oral Daily  . free water  375 mL Per Tube Q8H  . furosemide  40 mg Oral Daily  . hydrALAZINE  10 mg Oral Q8H  . insulin aspart  0-20 Units Subcutaneous Q4H  . insulin glargine  26 Units Subcutaneous Daily  . ipratropium-albuterol  3 mL Nebulization  TID  . isosorbide dinitrate  10 mg Oral TID  . mouth rinse  15 mL Mouth Rinse q12n4p  . montelukast  10 mg Oral QHS  . pantoprazole  40 mg Oral Daily  . pravastatin  40 mg Oral q1800  . predniSONE  10 mg Oral Q breakfast  . sacubitril-valsartan  1 tablet Oral BID  . spironolactone  25 mg Oral Daily   Continuous Infusions: . feeding supplement (JEVITY 1.2 CAL)       LOS: 28 days    Time spent: 25 minutes    Vernell Leep, MD, FACP, [facility]. [facility] Pager [phone]  If 7PM-7AM, please contact night-coverage [email or web address] Password [email or web address] 06/23/2017, 2:35 PM

## 2017-06-23 NOTE — Consult Note (Signed)
Kelli, Velasquez 76 y.o., female [id number]     Chief Complaint: hearing difficulty  HPI: no history taken today.  PMH: Past Medical History:  Diagnosis Date  . Allergy    allergic rhinitis  . Asthma   . Chronic bronchitis ([place])   . Coronary artery disease    cath January 2011 with DEs LAD and RCA  . Dyspnea   . Full dentures   . Gallstones   . GERD (gastroesophageal reflux disease)   . History of echocardiogram    Echo 5/17: mod LVH, EF 50-55%, ant-septal HK, Gr 1 DD, mod LAE //  b.  Echo 7/17: mild concentric LVH, EF 45-50%, inf-lat, inf, inf-septal HK, mild LAE  . History of nuclear stress test    Myoview 7/17: EF 48%, small mild apical defect, no ischemia, low risk  . Hyperlipidemia   . Hypertension   . 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 Myopia   . 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 Nocturnal oxygen desaturation    o2 at night  . Obesity   . Osteoarthritis    knees, fingers, shoulders  . Overactive bladder   . Oxygen deficiency    uses oxygen all day  . Rash    and other non specific skin eruptions  . Retaining fluid    in ankles and feet  . Urinary incontinence   . Vertigo   . Wears glasses     Surg Hx: Past Surgical History:  Procedure Laterality Date  . CATARACT EXTRACTION W/PHACO Right 12/16/2016   Procedure: CATARACT EXTRACTION PHACO AND INTRAOCULAR LENS PLACEMENT ([place])  right;  Surgeon: Leandrew Koyanagi, MD;  Location: [place];  Service: Ophthalmology;  Laterality: Right;  . CATARACT EXTRACTION W/PHACO Left 02/03/2017   Procedure: CATARACT EXTRACTION PHACO AND INTRAOCULAR LENS PLACEMENT ([place])  Left  Complicated;  Surgeon: Leandrew Koyanagi, MD;  Location: [place];  Service: Ophthalmology;  Laterality: Left;  Malyugin Uses oxygen   . CHOLECYSTECTOMY  92  . COLONOSCOPY    . CORONARY ANGIOGRAPHY N/A 06/16/2017   Procedure: CORONARY  ANGIOGRAPHY;  Surgeon: Larey Dresser, MD;  Location: [place] [facility];  Service: Cardiovascular;  Laterality: N/A;  . CORONARY ANGIOPLASTY WITH STENT PLACEMENT  07/2009   stent  . CORONARY STENT INTERVENTION N/A 06/16/2017   Procedure: CORONARY STENT INTERVENTION;  Surgeon: Wellington Hampshire, MD;  Location: [place] [facility];  Service: Cardiovascular;  Laterality: N/A;  . DILATION AND CURETTAGE OF UTERUS  1984  . INCISIONAL HERNIA REPAIR N/A 05/16/2013   Procedure: HERNIA REPAIR INFRAUMILICAL INCISIONAL;  Surgeon: Joyice Faster. Cornett, MD;  Location: [place];  Service: General;  Laterality: N/A;  umbilical  . INSERTION OF MESH N/A 05/16/2013   Procedure: INSERTION OF MESH;  Surgeon: Joyice Faster. Cornett, MD;  Location: [place];  Service: General;  Laterality: N/A;  umbilical  . JOINT REPLACEMENT  2007   right total knee replacement  . RIGHT/LEFT HEART CATH AND CORONARY ANGIOGRAPHY N/A 06/16/2017   Procedure: RIGHT/LEFT HEART CATH AND CORONARY ANGIOGRAPHY;  Surgeon: Larey Dresser, MD;  Location: [place] [facility];  Service: Cardiovascular;  Laterality: N/A;  . TOTAL KNEE ARTHROPLASTY  2010   left , then vocal cord infection post op  . VIDEO BRONCHOSCOPY Bilateral 07/05/2013   Procedure: VIDEO BRONCHOSCOPY WITH FLUORO;  Surgeon: Tanda Rockers, MD;  [facility];  Service: Cardiopulmonary;  Laterality: Bilateral;  . vocal cord polypectomy      FHx:   Family History  Problem Relation Age of Onset  . Stroke Mother   . Diabetes Mother 27  . Stroke Father   . Heart failure Father   . Breast cancer Maternal Grandmother   . Colon cancer Neg Hx    SocHx:  reports that she quit smoking about 34 years ago. Her smoking use included cigarettes. She has a 25.00 pack-year smoking history. she has never used smokeless tobacco. She reports that she drinks alcohol. She reports that she does not use drugs.  ALLERGIES:  Allergies  Allergen Reactions   . Fish Allergy Anaphylaxis  . Shellfish Allergy Anaphylaxis  . Aspirin     REACTION: nausea and vomiting High doses  . Atorvastatin     REACTION: leg pain  . Crestor [Rosuvastatin Calcium] Other (See Comments)    Muscle pain - allergy/intolerance  . Penicillins     Due to mold allergy per pt  . Simvastatin     REACTION: muscle pain  . Trandolapril     REACTION: leg pain    Medications Prior to Admission  Medication Sig Dispense Refill  . acetaminophen (TYLENOL) 325 MG tablet Take 650 mg by mouth every 6 (six) hours as needed for mild pain. Take as needed per bottle     . acetaminophen-codeine (TYLENOL #3) 300-30 MG tablet One every 4 hours as needed for cough 40 tablet 0  . amLODipine (NORVASC) 10 MG tablet Take 1 tablet (10 mg total) by mouth daily. 90 tablet 3  . aspirin 81 MG tablet Take 81 mg by mouth daily.    . chlorpheniramine (CHLOR-TRIMETON) 4 MG tablet Take 4 mg by mouth every morning.     . cromolyn (NASALCROM) 5.2 MG/ACT nasal spray Place 2 sprays into both nostrils daily as needed for allergies.    . cyanocobalamin (,VITAMIN B-12,) 1000 MCG/ML injection Inject 1,000 mcg into the muscle every 3 (three) months.    . Cyanocobalamin (VITAMIN B12) 3000 MCG SUBL Place 1 capsule under the tongue daily.    Marland Kitchen dextromethorphan-guaiFENesin (MUCINEX DM) 30-600 MG 12hr tablet Take 1 tablet by mouth 2 (two) times daily as needed for cough.    . EPIPEN 2-PAK 0.3 MG/0.3ML SOAJ injection INJECT 0.3 MLS (0.3 MG TOTAL) INTO THE MUSCLE ONCE AS NEEDED FOR ALLERGIC REACTION 2 Device 0  . esomeprazole (NEXIUM) 40 MG capsule Take 1 capsule (40 mg total) by mouth daily. 90 capsule 3  . ezetimibe (ZETIA) 10 MG tablet TAKE 1 TABLET (10 MG TOTAL) BY MOUTH DAILY. 90 tablet 3  . furosemide (LASIX) 20 MG tablet TAKE 1 TABLET BY MOUTH EVERY DAY 30 tablet 2  . furosemide (LASIX) 40 MG tablet Take 1 tablet (40 mg total) by mouth daily. Take 1 daily as needed for extra swelling (Patient taking  differently: Take 40 mg by mouth daily as needed for fluid. Take 1 daily as needed for extra swelling ) 90 tablet 1  . montelukast (SINGULAIR) 10 MG tablet Take 1 tablet (10 mg total) by mouth at bedtime. 90 tablet 3  . Multiple Vitamin (MULTIVITAMIN) capsule Take 1 capsule by mouth daily.    . OXYGEN Inhale 3 L into the lungs continuous. 24/7 2 lpm  [employer]    . potassium chloride SA (KLOR-CON M20) 20 MEQ tablet Take 1 tablet (20 mEq total) by mouth daily as needed. (Patient taking differently: Take 20 mEq by mouth daily. )  90 tablet 3  . PREDNISONE PO Take 15-20 mg by mouth daily. Take 20 mg one day and 15 mg the next days    . PROAIR HFA 108 (90 BASE) MCG/ACT inhaler INHALE 2 PUFFS BY MOUTH EVERY 4 HOURS AS NEEDED FOR WHEEZING OR FOR SHORTNESS OF BREATH 8 Inhaler 5  . ranitidine (ZANTAC) 75 MG tablet Take 300 mg by mouth at bedtime.       Results for orders placed or performed during the hospital encounter of 05/26/17 (from the past 48 hour(s))  Glucose, capillary     Status: Abnormal   Collection Time: 06/22/17 12:37 AM  Result Value Ref Range   Glucose-Capillary 100 (H) 65 - 99 mg/dL  Basic metabolic panel     Status: Abnormal   Collection Time: 06/22/17  3:37 AM  Result Value Ref Range   Sodium 142 135 - 145 mmol/L   Potassium 3.8 3.5 - 5.1 mmol/L   Chloride 94 (L) 101 - 111 mmol/L   CO2 40 (H) 22 - 32 mmol/L   Glucose, Bld 132 (H) 65 - 99 mg/dL   BUN 27 (H) 6 - 20 mg/dL   Creatinine, Ser 0.67 0.44 - 1.00 mg/dL   Calcium 9.0 8.9 - 10.3 mg/dL   GFR calc non Af Amer >60 >60 mL/min   GFR calc Af Amer >60 >60 mL/min    Comment: (NOTE) The eGFR has been calculated using the CKD EPI equation. This calculation has not been validated in all clinical situations. eGFR's persistently <60 mL/min signify possible Chronic Kidney Disease.    Anion gap 8 5 - 15  Glucose, capillary     Status: Abnormal   Collection Time: 06/22/17  4:35 AM  Result Value Ref Range   Glucose-Capillary 136 (H)  65 - 99 mg/dL  Glucose, capillary     Status: Abnormal   Collection Time: 06/22/17  7:45 AM  Result Value Ref Range   Glucose-Capillary 116 (H) 65 - 99 mg/dL  Blood gas, arterial     Status: Abnormal   Collection Time: 06/22/17  8:18 AM  Result Value Ref Range   O2 Content 3.0 L/min   Delivery systems NASAL CANNULA    pH, Arterial 7.409 7.350 - 7.450   pCO2 arterial 67.7 (HH) 32.0 - 48.0 mmHg    Comment: CRITICAL RESULT CALLED TO, READ BACK BY AND VERIFIED WITH: SHEILA Maguire,RN AT 0852, BY NICKY DICK,RRT,RCP ON 06/22/2017    pO2, Arterial 52.9 (L) 83.0 - 108.0 mmHg   Bicarbonate 42.0 (H) 20.0 - 28.0 mmol/L   Acid-Base Excess 16.3 (H) 0.0 - 2.0 mmol/L   O2 Saturation 86.3 %   Patient temperature 98.6    Collection site BRACHIAL ARTERY    Drawn by [id number]    Sample type ARTERIAL DRAW    Allens test (pass/fail) PASS PASS  Glucose, capillary     Status: Abnormal   Collection Time: 06/22/17 12:28 PM  Result Value Ref Range   Glucose-Capillary 192 (H) 65 - 99 mg/dL  Glucose, capillary     Status: Abnormal   Collection Time: 06/22/17  4:47 PM  Result Value Ref Range   Glucose-Capillary 184 (H) 65 - 99 mg/dL  Glucose, capillary     Status: Abnormal   Collection Time: 06/22/17  8:24 PM  Result Value Ref Range   Glucose-Capillary 131 (H) 65 - 99 mg/dL  Glucose, capillary     Status: Abnormal   Collection Time: 06/22/17 11:57 PM  Result Value Ref Range  Glucose-Capillary 119 (H) 65 - 99 mg/dL  Glucose, capillary     Status: Abnormal   Collection Time: 06/23/17  3:47 AM  Result Value Ref Range   Glucose-Capillary 101 (H) 65 - 99 mg/dL  Basic metabolic panel     Status: Abnormal   Collection Time: 06/23/17  4:22 AM  Result Value Ref Range   Sodium 139 135 - 145 mmol/L   Potassium 4.1 3.5 - 5.1 mmol/L   Chloride 91 (L) 101 - 111 mmol/L   CO2 39 (H) 22 - 32 mmol/L   Glucose, Bld 122 (H) 65 - 99 mg/dL   BUN 25 (H) 6 - 20 mg/dL   Creatinine, Ser 0.68 0.44 - 1.00 mg/dL   Calcium  9.2 8.9 - 10.3 mg/dL   GFR calc non Af Amer >60 >60 mL/min   GFR calc Af Amer >60 >60 mL/min    Comment: (NOTE) The eGFR has been calculated using the CKD EPI equation. This calculation has not been validated in all clinical situations. eGFR's persistently <60 mL/min signify possible Chronic Kidney Disease.    Anion gap 9 5 - 15  Glucose, capillary     Status: Abnormal   Collection Time: 06/23/17  7:48 AM  Result Value Ref Range   Glucose-Capillary 116 (H) 65 - 99 mg/dL  Glucose, capillary     Status: Abnormal   Collection Time: 06/23/17 12:16 PM  Result Value Ref Range   Glucose-Capillary 179 (H) 65 - 99 mg/dL  Glucose, capillary     Status: Abnormal   Collection Time: 06/23/17  4:28 PM  Result Value Ref Range   Glucose-Capillary 140 (H) 65 - 99 mg/dL  Glucose, capillary     Status: Abnormal   Collection Time: 06/23/17  8:08 PM  Result Value Ref Range   Glucose-Capillary 125 (H) 65 - 99 mg/dL   Dg Swallowing Func-speech Pathology  Result Date: 06/22/2017 Objective Swallowing Evaluation: Type of Study: MBS-Modified Barium Swallow Study  Patient Details Name: Kelli Velasquez MRN: [id number] Date of Birth: [birth year] Today's Date: 06/22/2017 Time: SLP Start Time (ACUTE ONLY): 1307 -SLP Stop Time (ACUTE ONLY): 1323 SLP Time Calculation (min) (ACUTE ONLY): 16 min Past Medical History: Past Medical History: Diagnosis Date . Allergy   allergic rhinitis . Asthma  . Chronic bronchitis ([place])  . Coronary artery disease   cath January 2011 with DEs LAD and RCA . Dyspnea  . Full dentures  . Gallstones  . GERD (gastroesophageal reflux disease)  . History of echocardiogram   Echo 5/17: mod LVH, EF 50-55%, ant-septal HK, Gr 1 DD, mod LAE //  b.  Echo 7/17: mild concentric LVH, EF 45-50%, inf-lat, inf, inf-septal HK, mild LAE . History of nuclear stress test   Myoview 7/17: EF 48%, small mild apical defect, no ischemia, low risk . Hyperlipidemia  . Hypertension  . Myocardial infarction ([place])    subendocardial, initial episode, 2010 two stents placed . Myopia  . 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 Nocturnal oxygen desaturation   o2 at night . Obesity  . Osteoarthritis   knees, fingers, shoulders . Overactive bladder  . Oxygen deficiency   uses oxygen all day . Rash   and other non specific skin eruptions . Retaining fluid   in ankles and feet . Urinary incontinence  . Vertigo  . Wears glasses  Past Surgical History: Past Surgical History: Procedure Laterality Date . CATARACT EXTRACTION W/PHACO Right 12/16/2016  Procedure: CATARACT EXTRACTION PHACO AND  INTRAOCULAR LENS PLACEMENT ([place])  right;  Surgeon: Leandrew Koyanagi, MD;  Location: [facility];  Service: Ophthalmology;  Laterality: Right; . CATARACT EXTRACTION W/PHACO Left 02/03/2017  Procedure: CATARACT EXTRACTION PHACO AND INTRAOCULAR LENS PLACEMENT ([place])  Left  Complicated;  Surgeon: Leandrew Koyanagi, MD;  Location: [facility];  Service: Ophthalmology;  Laterality: Left;  Malyugin Uses oxygen  . CHOLECYSTECTOMY  92 . COLONOSCOPY   . CORONARY ANGIOGRAPHY N/A 06/16/2017  Procedure: CORONARY ANGIOGRAPHY;  Surgeon: Larey Dresser, MD;  Location: [place] [facility];  Service: Cardiovascular;  Laterality: N/A; . CORONARY ANGIOPLASTY WITH STENT PLACEMENT  07/2009  stent . CORONARY STENT INTERVENTION N/A 06/16/2017  Procedure: CORONARY STENT INTERVENTION;  Surgeon: Wellington Hampshire, MD;  Location: [facility] [facility];  Service: Cardiovascular;  Laterality: N/A; . DILATION AND CURETTAGE OF UTERUS  1984 . INCISIONAL HERNIA REPAIR N/A 05/16/2013  Procedure: HERNIA REPAIR INFRAUMILICAL INCISIONAL;  Surgeon: Joyice Faster. Cornett, MD;  Location: [facility];  Service: General;  Laterality: N/A;  umbilical . INSERTION OF MESH N/A 05/16/2013  Procedure: INSERTION OF MESH;  Surgeon: Joyice Faster. Cornett, MD;  Location: [facility];  Service: General;  Laterality: N/A;  umbilical . JOINT REPLACEMENT   2007  right total knee replacement . RIGHT/LEFT HEART CATH AND CORONARY ANGIOGRAPHY N/A 06/16/2017  Procedure: RIGHT/LEFT HEART CATH AND CORONARY ANGIOGRAPHY;  Surgeon: Larey Dresser, MD;  Location: [place] [facility];  Service: Cardiovascular;  Laterality: N/A; . TOTAL KNEE ARTHROPLASTY  2010  left , then vocal cord infection post op . VIDEO BRONCHOSCOPY Bilateral 07/05/2013  Procedure: VIDEO BRONCHOSCOPY WITH FLUORO;  Surgeon: Tanda Rockers, MD;  [facility];  Service: Cardiopulmonary;  Laterality: Bilateral; . vocal cord polypectomy   HPI: 76yo female former smoker (quit 1984) with hx CAD, HTN, CHF, BOOP followed by Dr. Melvyn Novas on chronic prednisone presented 10/31 with progressive SOB, cough, wheezing, BLE edema. She had significant respiratory distress in ER and failed trial bipap requiring intubation 10/31-11/4. FEES 11/6 showed severe edema and weakness, standing secretions, and diffuse residue that could not be cleared. Recommendations at that time were to remain NPO except for a few ice chips given after oral care. Pt was reintubated 11/7-11/12 and stayed on BiPAP post-extubation. SLP was consulted to reassess swallowing.  Subjective: pt alert, remains confused but improving Assessment / Plan / Recommendation CHL IP CLINICAL IMPRESSIONS 06/22/2017 Clinical Impression Pt has improving oropharyngeal function as her overall respiratory status, phonation (glottal closure), and mentation continue to progress. She has what appears to be a cognitively-based oral dysphagia, marked by oral holding, lingual rocking, and mildly prolonged mastication. Her swallow triggers at the valleculae with all consistencies with brief pooling before the swallow. Her pharyngeal clearance is good, but mildly decreased hyolaryngeal movement and decreased glottal closure results in trace penetration of nectar thick liquids on the first few cup sips. This cleared easily with a cued throat clear, and as the study  progressed, her airway protection improved - even with consecutive cup/straw sips. She did still have deeper penetration to the vocal folds with thin liquids. She triggered a throat clear once it reached the vocal folds, which seemed to clear most but not all of the penetrates from her laryngeal vestibule. Given her sensitivity to aspiration this admission and her still altered mentation, recommend to start more conservatively with Dys 2 textures and nectar thick liquids. As she continues to progress cognitively, and potentially as the NGT is removed, she has good  prognosis for further advancement. SLP Visit Diagnosis Dysphagia, oropharyngeal phase (R13.12) Attention and concentration deficit following -- Frontal lobe and executive function deficit following -- Impact on safety and function Mild aspiration risk;Moderate aspiration risk   CHL IP TREATMENT RECOMMENDATION 06/22/2017 Treatment Recommendations Therapy as outlined in treatment plan below   Prognosis 06/22/2017 Prognosis for Safe Diet Advancement Good Barriers to Reach Goals Cognitive deficits Barriers/Prognosis Comment -- CHL IP DIET RECOMMENDATION 06/22/2017 SLP Diet Recommendations Dysphagia 2 (Fine chop) solids;Nectar thick liquid Liquid Administration via Cup;Straw Medication Administration Whole meds with puree Compensations Slow rate;Small sips/bites;Clear throat intermittently Postural Changes Seated upright at 90 degrees   CHL IP OTHER RECOMMENDATIONS 06/22/2017 Recommended Consults -- Oral Care Recommendations Oral care BID Other Recommendations --   CHL IP FOLLOW UP RECOMMENDATIONS 06/22/2017 Follow up Recommendations Skilled Nursing facility   [facility] IP FREQUENCY AND DURATION 06/22/2017 Speech Therapy Frequency (ACUTE ONLY) min 2x/week Treatment Duration 2 weeks      CHL IP ORAL PHASE 06/22/2017 Oral Phase Impaired Oral - Pudding Teaspoon -- Oral - Pudding Cup -- Oral - Honey Teaspoon NT Oral - Honey Cup -- Oral - Nectar Teaspoon Delayed oral  transit Oral - Nectar Cup Delayed oral transit Oral - Nectar Straw Delayed oral transit Oral - Thin Teaspoon NT Oral - Thin Cup Delayed oral transit Oral - Thin Straw -- Oral - Puree Delayed oral transit Oral - Mech Soft Impaired mastication Oral - Regular -- Oral - Multi-Consistency -- Oral - Pill -- Oral Phase - Comment --  CH[facility] IP PHARYNGEAL PHASE 06/22/2017 Pharyngeal Phase Impaired Pharyngeal- Pudding Teaspoon -- Pharyngeal -- Pharyngeal- Pudding Cup -- Pharyngeal -- Pharyngeal- Honey Teaspoon NT Pharyngeal -- Pharyngeal- Honey Cup -- Pharyngeal -- Pharyngeal- Nectar Teaspoon Delayed swallow initiation-vallecula;Reduced anterior laryngeal mobility;Reduced laryngeal elevation;Reduced airway/laryngeal closure;Reduced tongue base retraction Pharyngeal Material does not enter airway Pharyngeal- Nectar Cup Delayed swallow initiation-vallecula;Reduced anterior laryngeal mobility;Reduced laryngeal elevation;Reduced airway/laryngeal closure;Reduced tongue base retraction;Penetration/Aspiration during swallow Pharyngeal Material enters airway, remains ABOVE vocal cords and not ejected out Pharyngeal- Nectar Straw Delayed swallow initiation-vallecula;Reduced anterior laryngeal mobility;Reduced laryngeal elevation;Reduced airway/laryngeal closure;Reduced tongue base retraction Pharyngeal -- Pharyngeal- Thin Teaspoon NT Pharyngeal -- Pharyngeal- Thin Cup Delayed swallow initiation-vallecula;Reduced anterior laryngeal mobility;Reduced laryngeal elevation;Reduced airway/laryngeal closure;Reduced tongue base retraction;Penetration/Aspiration during swallow Pharyngeal Material enters airway, remains ABOVE vocal cords and not ejected out;Material enters airway, CONTACTS cords and then ejected out Pharyngeal- Thin Straw -- Pharyngeal -- Pharyngeal- Puree Delayed swallow initiation-vallecula;Reduced anterior laryngeal mobility;Reduced laryngeal elevation;Reduced airway/laryngeal closure;Reduced tongue base retraction;Pharyngeal  residue - valleculae Pharyngeal -- Pharyngeal- Mechanical Soft Delayed swallow initiation-vallecula;Reduced anterior laryngeal mobility;Reduced laryngeal elevation;Reduced airway/laryngeal closure;Reduced tongue base retraction Pharyngeal -- Pharyngeal- Regular -- Pharyngeal -- Pharyngeal- Multi-consistency -- Pharyngeal -- Pharyngeal- Pill -- Pharyngeal -- Pharyngeal Comment --  CHL IP CERVICAL ESOPHAGEAL PHASE 06/22/2017 Cervical Esophageal Phase WFL Pudding Teaspoon -- Pudding Cup -- Honey Teaspoon -- Honey Cup -- Nectar Teaspoon -- Nectar Cup -- Nectar Straw -- Thin Teaspoon -- Thin Cup -- Thin Straw -- Puree -- Mechanical Soft -- Regular -- Multi-consistency -- Pill -- Cervical Esophageal Comment -- No flowsheet data found. [name] 06/22/2017, 1:56 PM  [name], M.A. CCC-SLP [phone]                Blood pressure (!) 115/54, pulse 74, temperature 98.9 F (37.2 C), temperature source Oral, resp. rate 18, height 5' 4" (1.626 m), weight 105.9 kg (233 lb 7.5 oz), last menstrual period 07/27/1980, SpO2 97 %.  PHYSICAL EXAM: Not performed  Assessment/Plan Per audiometry, presbycusis and wax impactions AU  Plan:  Instruments ordered at 1545.  After 3 return visits, no instruments still available at bedside by 2100.  Unable to do an adequate assessment .   Will attempt to return to do this tomorrow if I have time.    Jodi Marble [id number]/4627, 8:57 PM

## 2017-06-23 NOTE — Progress Notes (Signed)
Patient has an episode of bradycardia (30's-40's) and 4.5 sec of asystole during a nap around 12:25. Hongagli MD and Darrick Grinder NP made aware.

## 2017-06-23 NOTE — Procedures (Signed)
Bedside Audiometric Evaluation  Name:  SHAWNYA MAYOR DOB:    [birth year] MRN:    [id number]  Reason for Referral:    Hearing loss  Pain:   None  Otoscopic exam:  Dark wax occluding both ear canals. Could not see eardrum.  Audiometric Results in dB:                *Masking used                     500 Hz 1000 Hz  2000     Hz 4000 Hz 6000 Hz 8000 Hz  Right ear Air conduction 55/60 65 55 60 75 NR@90dB   Right ear Bone conduction 30  20  *35 35  35 *40 - -  Left ear Air conduction 45/50 50 50 60 75 90  Left ear Bone conduction DNT  15 *30 30  25  *30 - -  Test reliability: good to fair (patent was getting tired and having trouble focusing some of the time). Limited masking performed because she was beginning to fall asleep while testing.  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  Patient/Family Education:  The patient agreed to testing per MD order.  The test results and recommendations were explained to [name].   Recommendations:  1. ENT consult and wax removal 2. Repeat hearing test after wax removal  If you have any questions, please call [phone].  Chelsey Kimberley A. Rosana Hoes, Au.D., [facility] Doctor of Audiology  06/23/2017 1:01 PM

## 2017-06-23 NOTE — Progress Notes (Signed)
Patient ID: Kelli Velasquez, female   DOB: Dec 09, 1940, 76 y.o.   MRN: [id number]      Advanced Heart Failure Rounding Note  Subjective:    Over 2 weeks prior to admission, she had increased dyspnea with exertion, she was short of breath at rest on the day of admission.  Her husband says she has been taking 20 mg of lasix daily and occasionally an extra 20 mg of lasix.   Presented to [facility] ED with respiratory distress. Family reported increased dyspnea over the week prior to admission. In ED she received solumedrol and duonebs. Placed on Bipap but continue decline requiring intubation. CXR concerning for interstitial edema and bilateral effusions.  Extubated 11/4. Noted to have swelling of upper airways on speech/swallow evaluation 11/6.  Developed respiratory distress 11/6 in the evening with hypoxia and hypercapnia and was re-intubated.    Extubated 06/07/17.   CXR 06/09/17 with LLL PNA.  She has completed abx for MSSA PNA.   UE [place] 06/14/17 with non-occlusive DVT in right axillary vein. Noted around PICC line in right basilic vein, and short segment of SVT noted in the forearm within the cephalic vein. LUE negative  BLE [place] 06/14/17 negative for DVTs.  LHC (11/21) with DES to RCA (95% mid RCA stenosis).   Yesterday hydralazine was cut back and losartan was increased.  Denies SOB. Feeling better. Tolerated diet today.   Cardiac Studies: Echo 05/26/2017 EF 35-40%  Grade I DD RV normal Echo 2017 EF 45-50% with WMA Myoview 2017 No significant ischemia EF 48%.  [place] 2011- DES LAD and RCA.   Objective:   Weight Range: 233 lb 7.5 oz (105.9 kg) Body mass index is 40.07 kg/m.   Vital Signs:   Temp:  [98.6 F (37 C)-98.7 F (37.1 C)] 98.6 F (37 C) (11/28 0550) Pulse Rate:  [71-90] 90 (11/28 0550) Resp:  [18-19] 18 (11/28 0550) BP: (99-161)/(51-124) 150/68 (11/28 0700) SpO2:  [93 %-100 %] 97 % (11/28 0851) Weight:  [233 lb 7.5 oz (105.9 kg)] 233 lb 7.5 oz (105.9 kg) (11/27 1127) Last  BM Date: 06/23/17  Weight change: Filed Weights   06/21/17 0326 06/22/17 0300 06/22/17 1127  Weight: 237 lb (107.5 kg) 251 lb (113.9 kg) 233 lb 7.5 oz (105.9 kg)   Intake/Output:   Intake/Output Summary (Last 24 hours) at 06/23/2017 0947 Last data filed at 06/23/2017 0400 Gross per 24 hour  Intake 1783 ml  Output 950 ml  Net 833 ml    Physical Exam  General:  Well appearing. No resp difficulty. In bed HEENT: normal except cortrak.  Neck: supple. JVD hard to assess due to body habitus. Carotids 2+ bilat; no bruits. No lymphadenopathy or thryomegaly appreciated. Cor: PMI nondisplaced. Regular rate & rhythm. No rubs, gallops or murmurs. Lungs: clear Abdomen:obese, soft, nontender, nondistended. No hepatosplenomegaly. No bruits or masses. Good bowel sounds. Extremities: no cyanosis, clubbing, rash, edema Neuro: alert & orientedx3, cranial nerves grossly intact. moves all 4 extremities w/o difficulty. Affect pleasant    Telemetry  NSR 70-80 pacs. Personally reviewed.     Labs    CBC No results for input(s): WBC, NEUTROABS, HGB, HCT, MCV, PLT in the last 72 hours. Basic Metabolic Panel Recent Labs    06/22/17 0337 06/23/17 0422  NA 142 139  K 3.8 4.1  CL 94* 91*  CO2 40* 39*  GLUCOSE 132* 122*  BUN 27* 25*  CREATININE 0.67 0.68  CALCIUM 9.0 9.2   Liver Function Tests No results for  input(s): AST, ALT, ALKPHOS, BILITOT, PROT, ALBUMIN in the last 72 hours. No results for input(s): LIPASE, AMYLASE in the last 72 hours. Cardiac Enzymes No results for input(s): CKTOTAL, CKMB, CKMBINDEX, TROPONINI in the last 72 hours. BNP: BNP (last 3 results) Recent Labs    05/26/17 0610  BNP 760.9*   ProBNP (last 3 results) Recent Labs    01/11/17 1053  PROBNP 264.0*   D-Dimer No results for input(s): DDIMER in the last 72 hours. Hemoglobin A1C No results for input(s): HGBA1C in the last 72 hours. Fasting Lipid Panel No results for input(s): CHOL, HDL, LDLCALC, TRIG,  CHOLHDL, LDLDIRECT in the last 72 hours. Thyroid Function Tests No results for input(s): TSH, T4TOTAL, T3FREE, THYROIDAB in the last 72 hours.  Invalid input(s): FREET3  Other results:  Imaging   Dg Swallowing Func-speech Pathology  Result Date: 06/22/2017 Objective Swallowing Evaluation: Type of Study: MBS-Modified Barium Swallow Study  Patient Details Name: Kelli Velasquez MRN: [id number] Date of Birth: 1940-09-06 Today's Date: 06/22/2017 Time: SLP Start Time (ACUTE ONLY): 9326 -SLP Stop Time (ACUTE ONLY): 1323 SLP Time Calculation (min) (ACUTE ONLY): 16 min Past Medical History: Past Medical History: Diagnosis Date . Allergy   allergic rhinitis . Asthma  . Chronic bronchitis ([place])  . Coronary artery disease   cath January 2011 with DEs LAD and RCA . Dyspnea  . Full dentures  . Gallstones  . GERD (gastroesophageal reflux disease)  . History of echocardiogram   Echo 5/17: mod LVH, EF 50-55%, ant-septal HK, Gr 1 DD, mod LAE //  b.  Echo 7/17: mild concentric LVH, EF 45-50%, inf-lat, inf, inf-septal HK, mild LAE . History of nuclear stress test   Myoview 7/17: EF 48%, small mild apical defect, no ischemia, low risk . Hyperlipidemia  . Hypertension  . 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 Myopia  . 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 Nocturnal oxygen desaturation   o2 at night . Obesity  . Osteoarthritis   knees, fingers, shoulders . Overactive bladder  . Oxygen deficiency   uses oxygen all day . Rash   and other non specific skin eruptions . Retaining fluid   in ankles and feet . Urinary incontinence  . Vertigo  . Wears glasses  Past Surgical History: Past Surgical History: Procedure Laterality Date . CATARACT EXTRACTION W/PHACO Right 12/16/2016  Procedure: CATARACT EXTRACTION PHACO AND INTRAOCULAR LENS PLACEMENT ([place])  right;  Surgeon: Leandrew Koyanagi, MD;  Location: [place];  Service: Ophthalmology;  Laterality:  Right; . CATARACT EXTRACTION W/PHACO Left 02/03/2017  Procedure: CATARACT EXTRACTION PHACO AND INTRAOCULAR LENS PLACEMENT ([place])  Left  Complicated;  Surgeon: Leandrew Koyanagi, MD;  Location: [place];  Service: Ophthalmology;  Laterality: Left;  [place]ugin Uses oxygen  . CHOLECYSTECTOMY  92 . COLONOSCOPY   . CORONARY ANGIOGRAPHY N/A 06/16/2017  Procedure: CORONARY ANGIOGRAPHY;  Surgeon: Larey Dresser, MD;  Location: [place] [facility];  Service: Cardiovascular;  Laterality: N/A; . CORONARY ANGIOPLASTY WITH STENT PLACEMENT  07/2009  stent . CORONARY STENT INTERVENTION N/A 06/16/2017  Procedure: CORONARY STENT INTERVENTION;  Surgeon: Wellington Hampshire, MD;  Location: [place] [facility];  Service: Cardiovascular;  Laterality: N/A; . DILATION AND CURETTAGE OF UTERUS  1984 . INCISIONAL HERNIA REPAIR N/A 05/16/2013  Procedure: HERNIA REPAIR INFRAUMILICAL INCISIONAL;  Surgeon: Joyice Faster. Cornett, MD;  Location: [place];  Service: General;  Laterality: N/A;  umbilical . INSERTION  OF MESH N/A 05/16/2013  Procedure: INSERTION OF MESH;  Surgeon: Joyice Faster. Cornett, MD;  Location: [place];  Service: General;  Laterality: N/A;  umbilical . JOINT REPLACEMENT  2007  right total knee replacement . RIGHT/LEFT HEART CATH AND CORONARY ANGIOGRAPHY N/A 06/16/2017  Procedure: RIGHT/LEFT HEART CATH AND CORONARY ANGIOGRAPHY;  Surgeon: Larey Dresser, MD;  Location: [place] [facility];  Service: Cardiovascular;  Laterality: N/A; . TOTAL KNEE ARTHROPLASTY  2010  left , then vocal cord infection post op . VIDEO BRONCHOSCOPY Bilateral 07/05/2013  Procedure: VIDEO BRONCHOSCOPY WITH FLUORO;  Surgeon: Tanda Rockers, MD;  Location: [facility];  Service: Cardiopulmonary;  Laterality: Bilateral; . vocal cord polypectomy   HPI: 76yo female former smoker (quit 1984) with hx CAD, HTN, CHF, BOOP followed by Dr. Melvyn Novas on chronic prednisone presented 10/31 with progressive SOB, cough, wheezing, BLE  edema. She had significant respiratory distress in ER and failed trial bipap requiring intubation 10/31-11/4. FEES 11/6 showed severe edema and weakness, standing secretions, and diffuse residue that could not be cleared. Recommendations at that time were to remain NPO except for a few ice chips given after oral care. Pt was reintubated 11/7-11/12 and stayed on BiPAP post-extubation. SLP was consulted to reassess swallowing.  Subjective: pt alert, remains confused but improving Assessment / Plan / Recommendation CHL IP CLINICAL IMPRESSIONS 06/22/2017 Clinical Impression Pt has improving oropharyngeal function as her overall respiratory status, phonation (glottal closure), and mentation continue to progress. She has what appears to be a cognitively-based oral dysphagia, marked by oral holding, lingual rocking, and mildly prolonged mastication. Her swallow triggers at the valleculae with all consistencies with brief pooling before the swallow. Her pharyngeal clearance is good, but mildly decreased hyolaryngeal movement and decreased glottal closure results in trace penetration of nectar thick liquids on the first few cup sips. This cleared easily with a cued throat clear, and as the study progressed, her airway protection improved - even with consecutive cup/straw sips. She did still have deeper penetration to the vocal folds with thin liquids. She triggered a throat clear once it reached the vocal folds, which seemed to clear most but not all of the penetrates from her laryngeal vestibule. Given her sensitivity to aspiration this admission and her still altered mentation, recommend to start more conservatively with Dys 2 textures and nectar thick liquids. As she continues to progress cognitively, and potentially as the NGT is removed, she has good prognosis for further advancement. SLP Visit Diagnosis Dysphagia, oropharyngeal phase (R13.12) Attention and concentration deficit following -- Frontal lobe and executive  function deficit following -- Impact on safety and function Mild aspiration risk;Moderate aspiration risk   CHL IP TREATMENT RECOMMENDATION 06/22/2017 Treatment Recommendations Therapy as outlined in treatment plan below   Prognosis 06/22/2017 Prognosis for Safe Diet Advancement Good Barriers to Reach Goals Cognitive deficits Barriers/Prognosis Comment -- CHL IP DIET RECOMMENDATION 06/22/2017 SLP Diet Recommendations Dysphagia 2 (Fine chop) solids;Nectar thick liquid Liquid Administration via Cup;Straw Medication Administration Whole meds with puree Compensations Slow rate;Small sips/bites;Clear throat intermittently Postural Changes Seated upright at 90 degrees   CHL IP OTHER RECOMMENDATIONS 06/22/2017 Recommended Consults -- Oral Care Recommendations Oral care BID Other Recommendations --   CHL IP FOLLOW UP RECOMMENDATIONS 06/22/2017 Follow up Recommendations Skilled Nursing facility   [facility] IP FREQUENCY AND DURATION 06/22/2017 Speech Therapy Frequency (ACUTE ONLY) min 2x/week Treatment Duration 2 weeks      CHL IP ORAL PHASE 06/22/2017 Oral Phase Impaired Oral - Pudding Teaspoon -- Oral -  Pudding Cup -- Oral - Honey Teaspoon NT Oral - Honey Cup -- Oral - Nectar Teaspoon Delayed oral transit Oral - Nectar Cup Delayed oral transit Oral - Nectar Straw Delayed oral transit Oral - Thin Teaspoon NT Oral - Thin Cup Delayed oral transit Oral - Thin Straw -- Oral - Puree Delayed oral transit Oral - Mech Soft Impaired mastication Oral - Regular -- Oral - Multi-Consistency -- Oral - Pill -- Oral Phase - Comment --  CHL IP PHARYNGEAL PHASE 06/22/2017 Pharyngeal Phase Impaired Pharyngeal- Pudding Teaspoon -- Pharyngeal -- Pharyngeal- Pudding Cup -- Pharyngeal -- Pharyngeal- Honey Teaspoon NT Pharyngeal -- Pharyngeal- Honey Cup -- Pharyngeal -- Pharyngeal- Nectar Teaspoon Delayed swallow initiation-vallecula;Reduced anterior laryngeal mobility;Reduced laryngeal elevation;Reduced airway/laryngeal closure;Reduced tongue base  retraction Pharyngeal Material does not enter airway Pharyngeal- Nectar Cup Delayed swallow initiation-vallecula;Reduced anterior laryngeal mobility;Reduced laryngeal elevation;Reduced airway/laryngeal closure;Reduced tongue base retraction;Penetration/Aspiration during swallow Pharyngeal Material enters airway, remains ABOVE vocal cords and not ejected out Pharyngeal- Nectar Straw Delayed swallow initiation-vallecula;Reduced anterior laryngeal mobility;Reduced laryngeal elevation;Reduced airway/laryngeal closure;Reduced tongue base retraction Pharyngeal -- Pharyngeal- Thin Teaspoon NT Pharyngeal -- Pharyngeal- Thin Cup Delayed swallow initiation-vallecula;Reduced anterior laryngeal mobility;Reduced laryngeal elevation;Reduced airway/laryngeal closure;Reduced tongue base retraction;Penetration/Aspiration during swallow Pharyngeal Material enters airway, remains ABOVE vocal cords and not ejected out;Material enters airway, CONTACTS cords and then ejected out Pharyngeal- Thin Straw -- Pharyngeal -- Pharyngeal- Puree Delayed swallow initiation-vallecula;Reduced anterior laryngeal mobility;Reduced laryngeal elevation;Reduced airway/laryngeal closure;Reduced tongue base retraction;Pharyngeal residue - valleculae Pharyngeal -- Pharyngeal- Mechanical Soft Delayed swallow initiation-vallecula;Reduced anterior laryngeal mobility;Reduced laryngeal elevation;Reduced airway/laryngeal closure;Reduced tongue base retraction Pharyngeal -- Pharyngeal- Regular -- Pharyngeal -- Pharyngeal- Multi-consistency -- Pharyngeal -- Pharyngeal- Pill -- Pharyngeal -- Pharyngeal Comment --  CHL IP CERVICAL ESOPHAGEAL PHASE 06/22/2017 Cervical Esophageal Phase WFL Pudding Teaspoon -- Pudding Cup -- Honey Teaspoon -- Honey Cup -- Nectar Teaspoon -- Nectar Cup -- Nectar Straw -- Thin Teaspoon -- Thin Cup -- Thin Straw -- Puree -- Mechanical Soft -- Regular -- Multi-consistency -- Pill -- Cervical Esophageal Comment -- No flowsheet data found.  [name] 06/22/2017, 1:56 PM  [name], M.A. CCC-SLP [phone]              Medications:     Scheduled Medications: . apixaban  10 mg Oral BID   Followed by  . [START ON 06/25/2017] apixaban  5 mg Oral BID  . bisoprolol  2.5 mg Oral Daily  . chlorhexidine  15 mL Mouth Rinse BID  . clopidogrel  75 mg Oral Daily  . cyanocobalamin  1,000 mcg Subcutaneous Daily  . ezetimibe  10 mg Oral QHS  . feeding supplement (PRO-STAT SUGAR FREE 64)  30 mL Per Tube TID  . folic acid  1 mg Oral Daily  . free water  375 mL Per Tube Q8H  . furosemide  40 mg Oral Daily  . hydrALAZINE  10 mg Oral Q8H  . insulin aspart  0-20 Units Subcutaneous Q4H  . insulin glargine  26 Units Subcutaneous Daily  . ipratropium-albuterol  3 mL Nebulization TID  . isosorbide dinitrate  10 mg Oral TID  . losartan  25 mg Oral BID  . mouth rinse  15 mL Mouth Rinse q12n4p  . montelukast  10 mg Oral QHS  . pantoprazole  40 mg Oral Daily  . pravastatin  40 mg Oral q1800  . predniSONE  10 mg Oral Q breakfast  . spironolactone  25 mg Oral Daily    Infusions: . 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  Admitted with acute/chronic respiratory failure  Assessment/Plan   1. Acute/chronic systolic CHF: Ischemic cardiomyopathy given prior history.  Echo this admission with EF down to 35-40% range, around 50% in past.  She was admitted with worsening dyspnea, elevated BNP, CXR with pulmonary edema => LHC 11/21 showed tight RCA stenosis, so may have been ischemia-mediated flash pulmonary edema.   - Volume status stable. Continue 40 mg lasix daily.  - Transition to Entresto 24/26 bid.   - Continue current dose of hydralazine 10 mg  tid and 10 isordil. Anticipate stopping tomorrow if SBP lower.  - Continue spironolactone 25 mg daily.  - Would keep her on bisoprolol 2.5 mg daily.  If HR dips to 40s while asleep and BP stable, not concerning.  2. Acute hypoxemic respiratory failure: Initially thought multifactorial due to pulmonary edema, effusions, BOOP and OHS.  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  She was diuresed.  At baseline, she is on 2L home oxygen.  Extubated 11/4 but developed respiratory distress 11/6 and re-intubated.  She had swelling of the upper airways noted by speech/swallow exam.  Found to have MSSA PNA.  She has now completed antibiotic course.   - Improved. On 4L O2 by [place], using Bipap at night.  3. CAD: h/o DES to LAD and RCA in 2011.  No chest pain but admitted with severe dyspnea/volume overload and fall in EF on echo.  Troponin mildly elevated but no trend. Cath 11/21 with severe mid RCA stenosis, treated with DES.  Suspect this may have triggered ischemic flash pulmonary edema.  - She got Brilinta with cath, have transitioned to Plavix with no ASA (as she will need ongoing anticoagulation for RUE DVT).  Not on ASA given anticoagulation need.  - She has not tolerated Zocor or Crestor.   - Continue low dose pravastatin 40 mg daily.  4.  AKI: Resolved. 5. HTN: Elevated today. As above increase losartan.   6. Code status: DNR/DNI at this time.  7. NSVT: No recurrence. Keep on bisoprolol at low dose.  8. DVT: RUE, triggered by PICC. Started on bivalirudin 06/14/17 on hematology recommendation (platelets had fallen but HIT negative by SRA).   - PICC has been removed.  - Given triggered RUE DVT, would favor 3 months anticoagulation with DOAC => transitioned from bivalirudin to Eliquis with DVT dosing. Continue 10 mg twice a day until Friday then cut back to 5 mg twice a day.    9. Delirium: resolved.     Length of Stay: [place], NP  06/23/2017 9:47 AM  Advanced Heart Failure Team Pager [phone] (M-F;  7a - 4p)  Please contact [facility] for night-coverage after hours (4p -7a ) and weekends on [email or web address]  Patient seen with NP, agree with the above note.  Stable from cardiac standpoint.  Today, will stop losartan and start Entresto 24/26 bid.  Probably will be able to stop hydralazine/isordil tomorrow.  Continue higher dose Eliquis until Friday then down to 5 mg bid.  Continue Plavix, no ASA.   Amy Clegg 06/23/2017 9:47 AM

## 2017-06-24 DIAGNOSIS — J9601 Acute respiratory failure with hypoxia: Secondary | ICD-10-CM

## 2017-06-24 LAB — BASIC METABOLIC PANEL
Anion gap: 8 (ref 5–15)
BUN: 22 mg/dL — ABNORMAL HIGH (ref 6–20)
CHLORIDE: 93 mmol/L — AB (ref 101–111)
CO2: 39 mmol/L — AB (ref 22–32)
Calcium: 9.1 mg/dL (ref 8.9–10.3)
Creatinine, Ser: 0.81 mg/dL (ref 0.44–1.00)
GFR calc non Af Amer: >60 mL/min (ref >60)
Glucose, Bld: 121 mg/dL — ABNORMAL HIGH (ref 65–99)
POTASSIUM: 4.2 mmol/L (ref 3.5–5.1)
SODIUM: 140 mmol/L (ref 135–145)

## 2017-06-24 LAB — CBC
HEMATOCRIT: 35.7 % — AB (ref 36.0–46.0)
Hemoglobin: 10.6 g/dL — ABNORMAL LOW (ref 12.0–15.0)
MCH: 29.5 pg (ref 26.0–34.0)
MCHC: 29.7 g/dL — ABNORMAL LOW (ref 30.0–36.0)
MCV: 99.4 fL (ref 78.0–100.0)
Platelets: 175 10*3/uL (ref 150–400)
RBC: 3.59 MIL/uL — AB (ref 3.87–5.11)
RDW: 19.3 % — ABNORMAL HIGH (ref 11.5–15.5)
WBC: 12.2 10*3/uL — AB (ref 4.0–10.5)

## 2017-06-24 LAB — GLUCOSE, CAPILLARY
GLUCOSE-CAPILLARY: 100 mg/dL — AB (ref 65–99)
GLUCOSE-CAPILLARY: 114 mg/dL — AB (ref 65–99)
GLUCOSE-CAPILLARY: 161 mg/dL — AB (ref 65–99)
GLUCOSE-CAPILLARY: 182 mg/dL — AB (ref 65–99)
GLUCOSE-CAPILLARY: 92 mg/dL (ref 65–99)
Glucose-Capillary: 78 mg/dL (ref 65–99)

## 2017-06-24 MED ORDER — MINERAL OIL PO OIL
TOPICAL_OIL | ORAL | Status: DC
  Administered 2017-06-24: 30 mL via OTIC
  Filled 2017-06-24 (×2): qty 30

## 2017-06-24 MED ORDER — MINERAL OIL PO OIL: TOPICAL_OIL | ORAL | Status: DC

## 2017-06-24 NOTE — Progress Notes (Signed)
Patient ID: Kelli Velasquez, female   DOB: 04-07-1941, 76 y.o.   MRN: [id number]      Advanced Heart Failure Rounding Note  Subjective:    Over 2 weeks prior to admission, she had increased dyspnea with exertion, she was short of breath at rest on the day of admission.  Her husband says she has been taking 20 mg of lasix daily and occasionally an extra 20 mg of lasix.   Presented to [facility] ED with respiratory distress. Family reported increased dyspnea over the week prior to admission. In ED she received solumedrol and duonebs. Placed on Bipap but continue decline requiring intubation. CXR concerning for interstitial edema and bilateral effusions.  Extubated 11/4. Noted to have swelling of upper airways on speech/swallow evaluation 11/6.  Developed respiratory distress 11/6 in the evening with hypoxia and hypercapnia and was re-intubated.    Extubated 06/07/17.   CXR 06/09/17 with LLL PNA.  She has completed abx for MSSA PNA.   UE [place] 06/14/17 with non-occlusive DVT in right axillary vein. Noted around PICC line in right basilic vein, and short segment of SVT noted in the forearm within the cephalic vein. LUE negative  BLE [place] 06/14/17 negative for DVTs.  LHC (11/21) with DES to RCA (95% mid RCA stenosis).   06/23/17 started on Entresto from losartan. BP in 110-130s overnight.   Feeling OK this am. Has tolerated diet past several days. Cortrak still in place. Denies SOB or CP.  Periods of sinus bradycardia overnight.   Cardiac Studies: Echo 05/26/2017 EF 35-40%  Grade I DD RV normal Echo 2017 EF 45-50% with WMA Myoview 2017 No significant ischemia EF 48%.  [place] 2011- DES LAD and RCA.   Objective:   Weight Range: 233 lb 7.5 oz (105.9 kg) Body mass index is 40.07 kg/m.   Vital Signs:   Temp:  [98.6 F (37 C)-99.9 F (37.7 C)] 98.6 F (37 C) (11/29 0541) Pulse Rate:  [66-74] 66 (11/29 0541) Resp:  [18-19] 19 (11/29 0541) BP: (113-139)/(33-54) 134/33 (11/29 0541) SpO2:  [94 %-97 %]  96 % (11/29 0541) Last BM Date: 06/23/17  Weight change: Filed Weights   06/21/17 0326 06/22/17 0300 06/22/17 1127  Weight: 237 lb (107.5 kg) 251 lb (113.9 kg) 233 lb 7.5 oz (105.9 kg)   Intake/Output:   Intake/Output Summary (Last 24 hours) at 06/24/2017 0731 Last data filed at 06/23/2017 1512 Gross per 24 hour  Intake 380.33 ml  Output 1050 ml  Net -669.67 ml    Physical Exam   General: Elderly appearing. No resp difficulty. HEENT: Normal x/ for cortrak. Neck: Supple. JVP difficult due to body habitus. Carotids 2+ bilat; no bruits. No thyromegaly or nodule noted. Cor: PMI nondisplaced. RRR, No M/G/R noted Lungs: CTAB, normal effort. Abdomen: Soft, non-tender, non-distended, no HSM. No bruits or masses. +BS  Extremities: No cyanosis, clubbing, or rash. R and LLE no edema.  Neuro: Alert & orientedx3, cranial nerves grossly intact. moves all 4 extremities w/o difficulty. Affect pleasant   Telemetry   NSR 70-80s with PACs, Personally reviewed.   Labs    CBC Recent Labs    06/24/17 0406  WBC 12.2*  HGB 10.6*  HCT 35.7*  MCV 99.4  PLT 062   Basic Metabolic Panel Recent Labs    06/23/17 0422 06/24/17 0406  NA 139 140  K 4.1 4.2  CL 91* 93*  CO2 39* 39*  GLUCOSE 122* 121*  BUN 25* 22*  CREATININE 0.68 0.81  CALCIUM 9.2  9.1   Liver Function Tests No results for input(s): AST, ALT, ALKPHOS, BILITOT, PROT, ALBUMIN in the last 72 hours. No results for input(s): LIPASE, AMYLASE in the last 72 hours. Cardiac Enzymes No results for input(s): CKTOTAL, CKMB, CKMBINDEX, TROPONINI in the last 72 hours. BNP: BNP (last 3 results) Recent Labs    05/26/17 0610  BNP 760.9*   ProBNP (last 3 results) Recent Labs    01/11/17 1053  PROBNP 264.0*   D-Dimer No results for input(s): DDIMER in the last 72 hours. Hemoglobin A1C No results for input(s): HGBA1C in the last 72 hours. Fasting Lipid Panel No results for input(s): CHOL, HDL, LDLCALC, TRIG, CHOLHDL,  LDLDIRECT in the last 72 hours. Thyroid Function Tests No results for input(s): TSH, T4TOTAL, T3FREE, THYROIDAB in the last 72 hours.  Invalid input(s): FREET3  Other results:  Imaging   No results found.  Medications:     Scheduled Medications: . apixaban  10 mg Oral BID   Followed by  . [START ON 06/25/2017] apixaban  5 mg Oral BID  . chlorhexidine  15 mL Mouth Rinse BID  . clopidogrel  75 mg Oral Daily  . cyanocobalamin  1,000 mcg Subcutaneous Daily  . ezetimibe  10 mg Oral QHS  . folic acid  1 mg Oral Daily  . free water  375 mL Per Tube Q8H  . furosemide  40 mg Oral Daily  . hydrALAZINE  10 mg Oral Q8H  . insulin aspart  0-20 Units Subcutaneous Q4H  . insulin glargine  26 Units Subcutaneous Daily  . ipratropium-albuterol  3 mL Nebulization TID  . isosorbide dinitrate  10 mg Oral TID  . mouth rinse  15 mL Mouth Rinse q12n4p  . montelukast  10 mg Oral QHS  . pantoprazole  40 mg Oral Daily  . pravastatin  40 mg Oral q1800  . predniSONE  10 mg Oral Q breakfast  . sacubitril-valsartan  1 tablet Oral BID  . spironolactone  25 mg Oral Daily    Infusions: . 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  Admitted with acute/chronic respiratory failure  Assessment/Plan   1. Acute/chronic systolic CHF: Ischemic cardiomyopathy given prior history.  Echo this admission with EF down to 35-40% range, around 50% in past.  She was admitted with worsening dyspnea, elevated BNP, CXR with pulmonary edema => LHC 11/21 showed tight RCA stenosis, so may have been ischemia-mediated flash pulmonary edema.     - Volume status stable.  - Continue lasix 40 mg daily.  - Continue Entresto 24/26 bid.  Creatinine stable.  - Stop Hydral/Isordil and watch today. May be able to increase Entresto as needed.  - Continue spironolactone 25 mg daily.  - Would keep her on bisoprolol 2.5 mg daily.  If HR dips to 40s while asleep and BP stable, not concerning.  2. Acute hypoxemic respiratory failure: Initially thought multifactorial due to pulmonary edema, effusions, BOOP and OHS.  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  She was diuresed.  At baseline, she is on 2L home oxygen.  Extubated 11/4 but developed respiratory distress 11/6 and re-intubated.  She had swelling of the upper airways  noted by speech/swallow exam.  Found to have MSSA PNA.  She has now completed antibiotic course.   - Resolved. On 4L O2 by , using Bipap at night.  3. CAD: h/o DES to LAD and RCA in 2011.  No chest pain but admitted with severe dyspnea/volume overload and fall in EF on echo.  Troponin mildly elevated but no trend. Cath 11/21 with severe mid RCA stenosis, treated with DES.  Suspect this may have triggered ischemic flash pulmonary edema.  - She got Brilinta with cath, have transitioned to Plavix with no ASA (as she will need ongoing anticoagulation for RUE DVT).  Not on ASA given anticoagulation need.  - She has not tolerated Zocor or Crestor.   - Continue low dose pravastatin 40 mg daily.  - No s/s of ischemia.    4.  AKI:  - Resolved.  5. HTN:  - Improved on Entresto. Meds as above.  6. Code status: DNR/DNI at this time. No change.  7. NSVT: No recurrence. Keep on bisoprolol at low dose. No change.  8. DVT: RUE, triggered by PICC. Started on bivalirudin 06/14/17 on hematology recommendation (platelets had fallen but HIT negative by SRA).   - PICC has been removed. No change.  - Given triggered RUE DVT, would favor 3 months anticoagulation with DOAC => transitioned from bivalirudin to Eliquis with DVT dosing. Continue 10 mg twice a  day until Friday 06/25/17 then cut back to 5 mg twice a day.    9. Delirium:  - Resolved.  Length of Stay: [address]  Annamaria Helling  06/24/2017 7:31 AM  Advanced Heart Failure Team Pager [phone] (M-F; 7a - 4p)  Please contact [facility] for night-coverage after hours (4p -7a ) and weekends on [email or web address]  Patient seen with PA, agree with the above note.  Can stop hydralazine/isordil.  Has nocturnal bradycardia, likely due to sleep disordered breathing.  Hopefully increase [employer].   Loralie Champagne 06/24/2017

## 2017-06-24 NOTE — Progress Notes (Signed)
  Speech Language Pathology Treatment: Dysphagia  Patient Details Name: Kelli Velasquez MRN: [id number] DOB: 1940/12/12 Today's Date: 06/24/2017 Time: 9937-1696 SLP Time Calculation (min) (ACUTE ONLY): 23 min  Assessment / Plan / Recommendation Clinical Impression  SLP reviewed results/recommendations from MBS with pt and her husband, reviewing current diet textures and instructions for thickening. Pt consumed advanced trials of soft solids with prolonged bolus formation and immediate coughing, with pt subjectively reporting that it was "too dry." Otherwise, she consumed nectar thick liquids with one instance of wet vocal quality, for which she reflexively cleared her throat. SLP provided Mod cues for use of a volitional throat clear as an added safety precaution throughout intake. Recommend to continue current diet textures and precautions with SLP f/u to assess for readiness to advance.   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 She had significant respiratory distress in ER and failed trial bipap requiring intubation 10/31-11/4. FEES 11/6 showed severe edema and weakness, standing secretions, and diffuse residue that could not be cleared. Recommendations at that time were to remain NPO except for a few ice chips given after oral care. Pt was reintubated 11/7-11/12 and stayed on BiPAP post-extubation. SLP was consulted to reassess swallowing.      SLP Plan  Continue with current plan of care       Recommendations  Diet recommendations: Dysphagia 2 (fine chop);Nectar-thick liquid Liquids provided via: Straw;Cup Medication Administration: Whole meds with puree Supervision: Patient able to self feed;Full supervision/cueing for compensatory strategies Compensations: Slow rate;Small sips/bites;Clear throat intermittently Postural Changes and/or Swallow  Maneuvers: Seated upright 90 degrees                Oral Care Recommendations: Oral care BID Follow up Recommendations: Skilled Nursing facility SLP Visit Diagnosis: Dysphagia, oropharyngeal phase (R13.12) Plan: Continue with current plan of care       GO                Kelli Velasquez 06/24/2017, 9:42 AM  Kelli Velasquez, M.A. CCC-SLP [phone]

## 2017-06-24 NOTE — Progress Notes (Signed)
Physical Therapy Treatment Patient Details Name: Kelli Velasquez MRN: [id number] DOB: 29-Oct-1940 Today's Date: 06/24/2017    History of Present Illness 76 yo female admitted for progressive respiratory distress.  She then developed altered mental status.  Pt has a past medical history including Allergy, Asthma, Chronic bronchitis, CAD, Dyspnea, GERD, Hyperlipidemia, Hypertension, Myocardial infarction, Myopia, Neoplasm of skin, Nocturnal oxygen desaturation, Obesity, Osteoarthritis, Oxygen deficiency,  Urinary incontinence, and Vertigo. Required intubation 10/31-11/4 and  11/6-11/14, now off Bipap.    PT Comments    On arrival to pt's room, pt reported she was very fatigued, however agreeable to therapy after some encouragement. Pt required min A +2 for transfers, and max cueing. +2 needed for safety as pt sits abruptly secondary to dizziness. She would benefit from continued skilled PT to increase safety with mobility and functional independence. Will continue to follow acutely.   Follow Up Recommendations  SNF;Supervision/Assistance - 24 hour     Equipment Recommendations  None recommended by PT;Other (comment)    Recommendations for Other Services       Precautions / Restrictions Precautions Precautions: Fall Precaution Comments: watch O2 saturations and HR Restrictions Weight Bearing Restrictions: No    Mobility  Bed Mobility Overal bed mobility: Needs Assistance Bed Mobility: Supine to Sit Rolling: Min assist   Supine to sit: Min assist;HOB elevated     General bed mobility comments: Min A to rise into long sitting and progress hips to EOB. Pt required max cueing for technique. Unsure if this is due to hearing loss or cognition.  Transfers Overall transfer level: Needs assistance Equipment used: Rolling walker (2 wheeled) Transfers: Sit to/from [facility] Sit to Stand: Min assist;+2 safety/equipment;+2 physical assistance(x2) Stand pivot transfers:  Min assist;+2 safety/equipment;+2 physical assistance       General transfer comment: min A +2 for transfers. Pt performed sit<>stand to scale for daily weight. While continuing to stand for peri care, pt became dizzy requiring sitting rest break before performing stand pivot transfer to recliner. Max cueing for positioning hips before sitting in recliner with pt attempting to sit prematurely.  Ambulation/Gait Ambulation/Gait assistance: +2 physical assistance;+2 safety/equipment;Min assist Ambulation Distance (Feet): 3 Feet Assistive device: Rolling walker (2 wheeled) Gait Pattern/deviations: Step-to pattern;Decreased stride length;Trunk flexed     General Gait Details: small steps to scale. Pt too dizzy for farther ambulation.   Stairs            Wheelchair Mobility    Modified Rankin (Stroke Patients Only)       Balance Overall balance assessment: Needs assistance Sitting-balance support: Bilateral upper extremity supported;Feet supported;No upper extremity supported Sitting balance-Leahy Scale: Fair Sitting balance - Comments: UE support for comfort   Standing balance support: During functional activity;Bilateral upper extremity supported Standing balance-Leahy Scale: Poor Standing balance comment: relies on UE support and external support.                             Cognition Arousal/Alertness: Awake/alert Behavior During Therapy: WFL for tasks assessed/performed Overall Cognitive Status: Within Functional Limits for tasks assessed                                 General Comments: not formally assess      Exercises      General Comments        Pertinent Vitals/Pain Pain Assessment: Faces Faces Pain Scale: No  hurt    Home Living                      Prior Function            PT Goals (current goals can now be found in the care plan section) Acute Rehab PT Goals Patient Stated Goal: get OOB  PT Goal  Formulation: With patient Time For Goal Achievement: 06/28/17 Potential to Achieve Goals: Fair Progress towards PT goals: Progressing toward goals    Frequency    Min 3X/week      PT Plan Current plan remains appropriate    Co-evaluation              AM-PAC PT "6 Clicks" Daily Activity  Outcome Measure  Difficulty turning over in bed (including adjusting bedclothes, sheets and blankets)?: Unable Difficulty moving from lying on back to sitting on the side of the bed? : Unable Difficulty sitting down on and standing up from a chair with arms (e.g., wheelchair, bedside commode, etc,.)?: Unable Help needed moving to and from a bed to chair (including a wheelchair)?: A Lot Help needed walking in hospital room?: A Lot Help needed climbing 3-5 steps with a railing? : Total 6 Click Score: 8    End of Session Equipment Utilized During Treatment: Oxygen;Gait belt Activity Tolerance: Patient limited by fatigue;Other (comment)(dizzy) Patient left: with call bell/phone within reach;with family/visitor present;in chair;with chair alarm set Nurse Communication: Mobility status PT Visit Diagnosis: Unsteadiness on feet (R26.81);Difficulty in walking, not elsewhere classified (R26.2)     Time: 9753-0051 PT Time Calculation (min) (ACUTE ONLY): 21 min  Charges:  $Therapeutic Activity: 8-22 mins                    G Codes:      Benjiman Core, [place] Pager [phone] Acute Rehab  Allena Katz 06/24/2017, 11:05 AM

## 2017-06-24 NOTE — Consult Note (Signed)
Kelli Velasquez, Kelli Velasquez 76 y.o., female [id number]     Chief Complaint: hearing difficulty  HPI: 76 yo wf, complex medical hospitalization.   Noted to have difficulty hearing by husband and staff.  Pt denies awareness.  No pain or drainage.  Husband reports hearing was good on arrival one month ago.  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  Audiologist noted ear wax.  Plan for pt to go to SNF for rehab soon.  PMH: Past Medical History:  Diagnosis Date  . Allergy    allergic rhinitis  . Asthma   . Chronic bronchitis ([place])   . Coronary artery disease    cath January 2011 with DEs LAD and RCA  . Dyspnea   . Full dentures   . Gallstones   . GERD (gastroesophageal reflux disease)   . History of echocardiogram    Echo 5/17: mod LVH, EF 50-55%, ant-septal HK, Gr 1 DD, mod LAE //  b.  Echo 7/17: mild concentric LVH, EF 45-50%, inf-lat, inf, inf-septal HK, mild LAE  . History of nuclear stress test    Myoview 7/17: EF 48%, small mild apical defect, no ischemia, low risk  . Hyperlipidemia   . Hypertension   . 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 Myopia   . 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 Nocturnal oxygen desaturation    o2 at night  . Obesity   . Osteoarthritis    knees, fingers, shoulders  . Overactive bladder   . Oxygen deficiency    uses oxygen all day  . Rash    and other non specific skin eruptions  . Retaining fluid    in ankles and feet  . Urinary incontinence   . Vertigo   . Wears glasses     Surg Hx: Past Surgical History:  Procedure Laterality Date  . CATARACT EXTRACTION W/PHACO Right 12/16/2016   Procedure: CATARACT EXTRACTION PHACO AND INTRAOCULAR LENS PLACEMENT ([place])  right;  Surgeon: Leandrew Koyanagi, MD;  Location: [place];  Service: Ophthalmology;  Laterality: Right;  . CATARACT EXTRACTION W/PHACO Left 02/03/2017   Procedure: CATARACT  EXTRACTION PHACO AND INTRAOCULAR LENS PLACEMENT ([place])  Left  Complicated;  Surgeon: Leandrew Koyanagi, MD;  Location: [place];  Service: Ophthalmology;  Laterality: Left;  Malyugin Uses oxygen   . CHOLECYSTECTOMY  92  . COLONOSCOPY    . CORONARY ANGIOGRAPHY N/A 06/16/2017   Procedure: CORONARY ANGIOGRAPHY;  Surgeon: Larey Dresser, MD;  Location: [place] [facility];  Service: Cardiovascular;  Laterality: N/A;  . CORONARY ANGIOPLASTY WITH STENT PLACEMENT  07/2009   stent  . CORONARY STENT INTERVENTION N/A 06/16/2017   Procedure: CORONARY STENT INTERVENTION;  Surgeon: Wellington Hampshire, MD;  Location: [place] [facility];  Service: Cardiovascular;  Laterality: N/A;  . DILATION AND CURETTAGE OF UTERUS  1984  . INCISIONAL HERNIA REPAIR N/A 05/16/2013   Procedure: HERNIA REPAIR INFRAUMILICAL INCISIONAL;  Surgeon: Joyice Faster. Cornett, MD;  Location: [place];  Service: General;  Laterality: N/A;  umbilical  . INSERTION OF MESH N/A 05/16/2013   Procedure: INSERTION OF MESH;  Surgeon: Joyice Faster. Cornett, MD;  Location: [place];  Service: General;  Laterality: N/A;  umbilical  . JOINT REPLACEMENT  2007   right total knee replacement  . RIGHT/LEFT HEART CATH AND CORONARY ANGIOGRAPHY N/A 06/16/2017   Procedure: RIGHT/LEFT HEART CATH AND CORONARY ANGIOGRAPHY;  Surgeon: Larey Dresser, MD;  Location: [place] [facility];  Service: Cardiovascular;  Laterality: N/A;  . TOTAL KNEE ARTHROPLASTY  2010   left , then vocal cord infection post op  . VIDEO BRONCHOSCOPY Bilateral 07/05/2013   Procedure: VIDEO BRONCHOSCOPY WITH FLUORO;  Surgeon: Tanda Rockers, MD;  [facility]: [facility];  Service: Cardiopulmonary;  Laterality: Bilateral;  . vocal cord polypectomy      FHx:   Family History  Problem Relation Age of Onset  . Stroke Mother   . Diabetes Mother 57  . Stroke Father   . Heart failure Father   . Breast cancer Maternal Grandmother   . Colon cancer  Neg Hx    SocHx:  reports that she quit smoking about 34 years ago. Her smoking use included cigarettes. She has a 25.00 pack-year smoking history. she has never used smokeless tobacco. She reports that she drinks alcohol. She reports that she does not use drugs.  ALLERGIES:  Allergies  Allergen Reactions  . Fish Allergy Anaphylaxis  . Shellfish Allergy Anaphylaxis  . Aspirin     REACTION: nausea and vomiting High doses  . Atorvastatin     REACTION: leg pain  . Crestor [Rosuvastatin Calcium] Other (See Comments)    Muscle pain - allergy/intolerance  . Penicillins     Due to mold allergy per pt  . Simvastatin     REACTION: muscle pain  . Trandolapril     REACTION: leg pain    Medications Prior to Admission  Medication Sig Dispense Refill  . acetaminophen (TYLENOL) 325 MG tablet Take 650 mg by mouth every 6 (six) hours as needed for mild pain. Take as needed per bottle     . acetaminophen-codeine (TYLENOL #3) 300-30 MG tablet One every 4 hours as needed for cough 40 tablet 0  . amLODipine (NORVASC) 10 MG tablet Take 1 tablet (10 mg total) by mouth daily. 90 tablet 3  . aspirin 81 MG tablet Take 81 mg by mouth daily.    . chlorpheniramine (CHLOR-TRIMETON) 4 MG tablet Take 4 mg by mouth every morning.     . cromolyn (NASALCROM) 5.2 MG/ACT nasal spray Place 2 sprays into both nostrils daily as needed for allergies.    . cyanocobalamin (,VITAMIN B-12,) 1000 MCG/ML injection Inject 1,000 mcg into the muscle every 3 (three) months.    . Cyanocobalamin (VITAMIN B12) 3000 MCG SUBL Place 1 capsule under the tongue daily.    Marland Kitchen dextromethorphan-guaiFENesin (MUCINEX DM) 30-600 MG 12hr tablet Take 1 tablet by mouth 2 (two) times daily as needed for cough.    . EPIPEN 2-PAK 0.3 MG/0.3ML SOAJ injection INJECT 0.3 MLS (0.3 MG TOTAL) INTO THE MUSCLE ONCE AS NEEDED FOR ALLERGIC REACTION 2 Device 0  . esomeprazole (NEXIUM) 40 MG capsule Take 1 capsule (40 mg total) by mouth daily. 90 capsule 3  .  ezetimibe (ZETIA) 10 MG tablet TAKE 1 TABLET (10 MG TOTAL) BY MOUTH DAILY. 90 tablet 3  . furosemide (LASIX) 20 MG tablet TAKE 1 TABLET BY MOUTH EVERY DAY 30 tablet 2  . furosemide (LASIX) 40 MG tablet Take 1 tablet (40 mg total) by mouth daily. Take 1 daily as needed for extra swelling (Patient taking differently: Take 40 mg by mouth daily as needed for fluid. Take 1 daily as needed for extra swelling ) 90 tablet 1  . montelukast (SINGULAIR) 10 MG tablet Take 1 tablet (10 mg total) by mouth at bedtime. 90 tablet 3  . Multiple Vitamin (  MULTIVITAMIN) capsule Take 1 capsule by mouth daily.    . OXYGEN Inhale 3 L into the lungs continuous. 24/7 2 lpm  [employer]    . potassium chloride SA (KLOR-CON M20) 20 MEQ tablet Take 1 tablet (20 mEq total) by mouth daily as needed. (Patient taking differently: Take 20 mEq by mouth daily. ) 90 tablet 3  . PREDNISONE PO Take 15-20 mg by mouth daily. Take 20 mg one day and 15 mg the next days    . PROAIR HFA 108 (90 BASE) MCG/ACT inhaler INHALE 2 PUFFS BY MOUTH EVERY 4 HOURS AS NEEDED FOR WHEEZING OR FOR SHORTNESS OF BREATH 8 Inhaler 5  . ranitidine (ZANTAC) 75 MG tablet Take 300 mg by mouth at bedtime.       Results for orders placed or performed during the hospital encounter of 05/26/17 (from the past 48 hour(s))  Glucose, capillary     Status: Abnormal   Collection Time: 06/22/17 12:28 PM  Result Value Ref Range   Glucose-Capillary 192 (H) 65 - 99 mg/dL  Glucose, capillary     Status: Abnormal   Collection Time: 06/22/17  4:47 PM  Result Value Ref Range   Glucose-Capillary 184 (H) 65 - 99 mg/dL  Glucose, capillary     Status: Abnormal   Collection Time: 06/22/17  8:24 PM  Result Value Ref Range   Glucose-Capillary 131 (H) 65 - 99 mg/dL  Glucose, capillary     Status: Abnormal   Collection Time: 06/22/17 11:57 PM  Result Value Ref Range   Glucose-Capillary 119 (H) 65 - 99 mg/dL  Glucose, capillary     Status: Abnormal   Collection Time: 06/23/17  3:47 AM   Result Value Ref Range   Glucose-Capillary 101 (H) 65 - 99 mg/dL  Basic metabolic panel     Status: Abnormal   Collection Time: 06/23/17  4:22 AM  Result Value Ref Range   Sodium 139 135 - 145 mmol/L   Potassium 4.1 3.5 - 5.1 mmol/L   Chloride 91 (L) 101 - 111 mmol/L   CO2 39 (H) 22 - 32 mmol/L   Glucose, Bld 122 (H) 65 - 99 mg/dL   BUN 25 (H) 6 - 20 mg/dL   Creatinine, Ser 0.68 0.44 - 1.00 mg/dL   Calcium 9.2 8.9 - 10.3 mg/dL   GFR calc non Af Amer >60 >60 mL/min   GFR calc Af Amer >60 >60 mL/min    Comment: (NOTE) The eGFR has been calculated using the CKD EPI equation. This calculation has not been validated in all clinical situations. eGFR's persistently <60 mL/min signify possible Chronic Kidney Disease.    Anion gap 9 5 - 15  Glucose, capillary     Status: Abnormal   Collection Time: 06/23/17  7:48 AM  Result Value Ref Range   Glucose-Capillary 116 (H) 65 - 99 mg/dL  Glucose, capillary     Status: Abnormal   Collection Time: 06/23/17 12:16 PM  Result Value Ref Range   Glucose-Capillary 179 (H) 65 - 99 mg/dL  Glucose, capillary     Status: Abnormal   Collection Time: 06/23/17  4:28 PM  Result Value Ref Range   Glucose-Capillary 140 (H) 65 - 99 mg/dL  Glucose, capillary     Status: Abnormal   Collection Time: 06/23/17  8:08 PM  Result Value Ref Range   Glucose-Capillary 125 (H) 65 - 99 mg/dL  Glucose, capillary     Status: Abnormal   Collection Time: 06/24/17 12:24 AM  Result  Value Ref Range   Glucose-Capillary 114 (H) 65 - 99 mg/dL  Glucose, capillary     Status: Abnormal   Collection Time: 06/24/17  3:48 AM  Result Value Ref Range   Glucose-Capillary 100 (H) 65 - 99 mg/dL  Basic metabolic panel     Status: Abnormal   Collection Time: 06/24/17  4:06 AM  Result Value Ref Range   Sodium 140 135 - 145 mmol/L   Potassium 4.2 3.5 - 5.1 mmol/L   Chloride 93 (L) 101 - 111 mmol/L   CO2 39 (H) 22 - 32 mmol/L   Glucose, Bld 121 (H) 65 - 99 mg/dL   BUN 22 (H) 6 - 20  mg/dL   Creatinine, Ser 0.81 0.44 - 1.00 mg/dL   Calcium 9.1 8.9 - 10.3 mg/dL   GFR calc non Af Amer >60 >60 mL/min   GFR calc Af Amer >60 >60 mL/min    Comment: (NOTE) The eGFR has been calculated using the CKD EPI equation. This calculation has not been validated in all clinical situations. eGFR's persistently <60 mL/min signify possible Chronic Kidney Disease.    Anion gap 8 5 - 15  CBC     Status: Abnormal   Collection Time: 06/24/17  4:06 AM  Result Value Ref Range   WBC 12.2 (H) 4.0 - 10.5 K/uL   RBC 3.59 (L) 3.87 - 5.11 MIL/uL   Hemoglobin 10.6 (L) 12.0 - 15.0 g/dL   HCT 35.7 (L) 36.0 - 46.0 %   MCV 99.4 78.0 - 100.0 fL   MCH 29.5 26.0 - 34.0 pg   MCHC 29.7 (L) 30.0 - 36.0 g/dL   RDW 19.3 (H) 11.5 - 15.5 %   Platelets 175 150 - 400 K/uL  Glucose, capillary     Status: None   Collection Time: 06/24/17  7:53 AM  Result Value Ref Range   Glucose-Capillary 92 65 - 99 mg/dL   Dg Swallowing Func-speech Pathology  Result Date: 06/22/2017 Objective Swallowing Evaluation: Type of Study: MBS-Modified Barium Swallow Study  Patient Details Name: Kelli Velasquez MRN: [id number] Date of Birth: 12-03-1940 Today's Date: 06/22/2017 Time: SLP Start Time (ACUTE ONLY): 1307 -SLP Stop Time (ACUTE ONLY): 1323 SLP Time Calculation (min) (ACUTE ONLY): 16 min Past Medical History: Past Medical History: Diagnosis Date . Allergy   allergic rhinitis . Asthma  . Chronic bronchitis ([place])  . Coronary artery disease   cath January 2011 with DEs LAD and RCA . Dyspnea  . Full dentures  . Gallstones  . GERD (gastroesophageal reflux disease)  . History of echocardiogram   Echo 5/17: mod LVH, EF 50-55%, ant-septal HK, Gr 1 DD, mod LAE //  b.  Echo 7/17: mild concentric LVH, EF 45-50%, inf-lat, inf, inf-septal HK, mild LAE . History of nuclear stress test   Myoview 7/17: EF 48%, small mild apical defect, no ischemia, low risk . Hyperlipidemia  . Hypertension  . Myocardial infarction ([place])   subendocardial, initial  episode, 2010 two stents placed . Myopia  . 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 Nocturnal oxygen desaturation   o2 at night . Obesity  . Osteoarthritis   knees, fingers, shoulders . Overactive bladder  . Oxygen deficiency   uses oxygen all day . Rash   and other non specific skin eruptions . Retaining fluid   in ankles and feet . Urinary incontinence  . Vertigo  . Wears glasses  Past Surgical History: Past Surgical History: Procedure Laterality Date .  CATARACT EXTRACTION W/PHACO Right 12/16/2016  Procedure: CATARACT EXTRACTION PHACO AND INTRAOCULAR LENS PLACEMENT ([place])  right;  Surgeon: Leandrew Koyanagi, MD;  Location: [place];  Service: Ophthalmology;  Laterality: Right; . CATARACT EXTRACTION W/PHACO Left 02/03/2017  Procedure: CATARACT EXTRACTION PHACO AND INTRAOCULAR LENS PLACEMENT ([place])  Left  Complicated;  Surgeon: Leandrew Koyanagi, MD;  Location: [place];  Service: Ophthalmology;  Laterality: Left;  Malyugin Uses oxygen  . CHOLECYSTECTOMY  92 . COLONOSCOPY   . CORONARY ANGIOGRAPHY N/A 06/16/2017  Procedure: CORONARY ANGIOGRAPHY;  Surgeon: Larey Dresser, MD;  Location: [place] [facility];  Service: Cardiovascular;  Laterality: N/A; . CORONARY ANGIOPLASTY WITH STENT PLACEMENT  07/2009  stent . CORONARY STENT INTERVENTION N/A 06/16/2017  Procedure: CORONARY STENT INTERVENTION;  Surgeon: Wellington Hampshire, MD;  Location: [place] [facility];  Service: Cardiovascular;  Laterality: N/A; . DILATION AND CURETTAGE OF UTERUS  1984 . INCISIONAL HERNIA REPAIR N/A 05/16/2013  Procedure: HERNIA REPAIR INFRAUMILICAL INCISIONAL;  Surgeon: Joyice Faster. Cornett, MD;  Location: [place];  Service: General;  Laterality: N/A;  umbilical . INSERTION OF MESH N/A 05/16/2013  Procedure: INSERTION OF MESH;  Surgeon: Joyice Faster. Cornett, MD;  Location: [place];  Service: General;  Laterality: N/A;  umbilical . JOINT REPLACEMENT  2007  right total knee  replacement . RIGHT/LEFT HEART CATH AND CORONARY ANGIOGRAPHY N/A 06/16/2017  Procedure: RIGHT/LEFT HEART CATH AND CORONARY ANGIOGRAPHY;  Surgeon: Larey Dresser, MD;  Location: [place] [facility];  Service: Cardiovascular;  Laterality: N/A; . TOTAL KNEE ARTHROPLASTY  2010  left , then vocal cord infection post op . VIDEO BRONCHOSCOPY Bilateral 07/05/2013  Procedure: VIDEO BRONCHOSCOPY WITH FLUORO;  Surgeon: Tanda Rockers, MD;  [facility];  Service: Cardiopulmonary;  Laterality: Bilateral; . vocal cord polypectomy   HPI: 76yo female former smoker (quit 1984) with hx CAD, HTN, CHF, BOOP followed by Dr. Melvyn Novas on chronic prednisone presented 10/31 with progressive SOB, cough, wheezing, BLE edema. She had significant respiratory distress in ER and failed trial bipap requiring intubation 10/31-11/4. FEES 11/6 showed severe edema and weakness, standing secretions, and diffuse residue that could not be cleared. Recommendations at that time were to remain NPO except for a few ice chips given after oral care. Pt was reintubated 11/7-11/12 and stayed on BiPAP post-extubation. SLP was consulted to reassess swallowing.  Subjective: pt alert, remains confused but improving Assessment / Plan / Recommendation CHL IP CLINICAL IMPRESSIONS 06/22/2017 Clinical Impression Pt has improving oropharyngeal function as her overall respiratory status, phonation (glottal closure), and mentation continue to progress. She has what appears to be a cognitively-based oral dysphagia, marked by oral holding, lingual rocking, and mildly prolonged mastication. Her swallow triggers at the valleculae with all consistencies with brief pooling before the swallow. Her pharyngeal clearance is good, but mildly decreased hyolaryngeal movement and decreased glottal closure results in trace penetration of nectar thick liquids on the first few cup sips. This cleared easily with a cued throat clear, and as the study progressed, her airway protection  improved - even with consecutive cup/straw sips. She did still have deeper penetration to the vocal folds with thin liquids. She triggered a throat clear once it reached the vocal folds, which seemed to clear most but not all of the penetrates from her laryngeal vestibule. Given her sensitivity to aspiration this admission and her still altered mentation, recommend to start more conservatively with Dys 2 textures and nectar thick liquids. As she continues to progress  cognitively, and potentially as the NGT is removed, she has good prognosis for further advancement. SLP Visit Diagnosis Dysphagia, oropharyngeal phase (R13.12) Attention and concentration deficit following -- Frontal lobe and executive function deficit following -- Impact on safety and function Mild aspiration risk;Moderate aspiration risk   CHL IP TREATMENT RECOMMENDATION 06/22/2017 Treatment Recommendations Therapy as outlined in treatment plan below   Prognosis 06/22/2017 Prognosis for Safe Diet Advancement Good Barriers to Reach Goals Cognitive deficits Barriers/Prognosis Comment -- CHL IP DIET RECOMMENDATION 06/22/2017 SLP Diet Recommendations Dysphagia 2 (Fine chop) solids;Nectar thick liquid Liquid Administration via Cup;Straw Medication Administration Whole meds with puree Compensations Slow rate;Small sips/bites;Clear throat intermittently Postural Changes Seated upright at 90 degrees   CHL IP OTHER RECOMMENDATIONS 06/22/2017 Recommended Consults -- Oral Care Recommendations Oral care BID Other Recommendations --   CH[facility] IP FOLLOW UP RECOMMENDATIONS 06/22/2017 Follow up Recommendations Skilled Nursing facility   [facility] IP FREQUENCY AND DURATION 06/22/2017 Speech Therapy Frequency (ACUTE ONLY) min 2x/week Treatment Duration 2 weeks      CHL IP ORAL PHASE 06/22/2017 Oral Phase Impaired Oral - Pudding Teaspoon -- Oral - Pudding Cup -- Oral - Honey Teaspoon NT Oral - Honey Cup -- Oral - Nectar Teaspoon Delayed oral transit Oral - Nectar Cup Delayed oral  transit Oral - Nectar Straw Delayed oral transit Oral - Thin Teaspoon NT Oral - Thin Cup Delayed oral transit Oral - Thin Straw -- Oral - Puree Delayed oral transit Oral - Mech Soft Impaired mastication Oral - Regular -- Oral - Multi-Consistency -- Oral - Pill -- Oral Phase - Comment --  CH[facility] IP PHARYNGEAL PHASE 06/22/2017 Pharyngeal Phase Impaired Pharyngeal- Pudding Teaspoon -- Pharyngeal -- Pharyngeal- Pudding Cup -- Pharyngeal -- Pharyngeal- Honey Teaspoon NT Pharyngeal -- Pharyngeal- Honey Cup -- Pharyngeal -- Pharyngeal- Nectar Teaspoon Delayed swallow initiation-vallecula;Reduced anterior laryngeal mobility;Reduced laryngeal elevation;Reduced airway/laryngeal closure;Reduced tongue base retraction Pharyngeal Material does not enter airway Pharyngeal- Nectar Cup Delayed swallow initiation-vallecula;Reduced anterior laryngeal mobility;Reduced laryngeal elevation;Reduced airway/laryngeal closure;Reduced tongue base retraction;Penetration/Aspiration during swallow Pharyngeal Material enters airway, remains ABOVE vocal cords and not ejected out Pharyngeal- Nectar Straw Delayed swallow initiation-vallecula;Reduced anterior laryngeal mobility;Reduced laryngeal elevation;Reduced airway/laryngeal closure;Reduced tongue base retraction Pharyngeal -- Pharyngeal- Thin Teaspoon NT Pharyngeal -- Pharyngeal- Thin Cup Delayed swallow initiation-vallecula;Reduced anterior laryngeal mobility;Reduced laryngeal elevation;Reduced airway/laryngeal closure;Reduced tongue base retraction;Penetration/Aspiration during swallow Pharyngeal Material enters airway, remains ABOVE vocal cords and not ejected out;Material enters airway, CONTACTS cords and then ejected out Pharyngeal- Thin Straw -- Pharyngeal -- Pharyngeal- Puree Delayed swallow initiation-vallecula;Reduced anterior laryngeal mobility;Reduced laryngeal elevation;Reduced airway/laryngeal closure;Reduced tongue base retraction;Pharyngeal residue - valleculae Pharyngeal --  Pharyngeal- Mechanical Soft Delayed swallow initiation-vallecula;Reduced anterior laryngeal mobility;Reduced laryngeal elevation;Reduced airway/laryngeal closure;Reduced tongue base retraction Pharyngeal -- Pharyngeal- Regular -- Pharyngeal -- Pharyngeal- Multi-consistency -- Pharyngeal -- Pharyngeal- Pill -- Pharyngeal -- Pharyngeal Comment --  CHL IP CERVICAL ESOPHAGEAL PHASE 06/22/2017 Cervical Esophageal Phase WFL Pudding Teaspoon -- Pudding Cup -- Honey Teaspoon -- Honey Cup -- Nectar Teaspoon -- Nectar Cup -- Nectar Straw -- Thin Teaspoon -- Thin Cup -- Thin Straw -- Puree -- Mechanical Soft -- Regular -- Multi-consistency -- Pill -- Cervical Esophageal Comment -- No flowsheet data found. [name] 06/22/2017, 1:56 PM  [name], M.A. CCC-SLP [phone]              Blood pressure (!) 122/47, pulse 76, temperature 98.6 F (37 C), temperature source Oral, resp. rate 19, height 5' 4"  (1.626 m), weight 105.9 kg (233 lb 7.5 oz), last menstrual period 07/27/1980, SpO2  96 %.  PHYSICAL EXAM: Overall appearance:  Heavyset.  Perhaps sl slow mentally. Head:  NCAT Ears:  Sl narrow canals with dry leathery wax and skin on both sides.  Successfully cleared on LEFT.  TM appears nl. Nose:  Not examined. Oral Cavity:  Not examined Oral Pharynx/Hypopharynx/Larynx:  Not examined Neuro:  Not examined Neck:  Not examined.   Studies Reviewed:  Audiogram 28 NOV    Assessment/Plan Presbycusis, with superimposed conductive component.  Not sure the cerumen was sufficiently obstructive to explain this.  Some eustachian tube dysfunction would not be surprising in light of her prolonged hospital course.  No documentation of this yet.   Plan:  I would have someone place one drop of mineral oil or similar in each ear once weekly to soften the wax.  I would like to evaluate and clean further in my office in 2-3 weeks. We can do hearing testing and also test for fluid in the ears.   She might get some  benefit from temporary hearing aids.    Thanks,  Jodi Marble 70/05/33, 11:50 AM

## 2017-06-24 NOTE — Progress Notes (Signed)
PROGRESS NOTE    Kelli Velasquez  OYD:[id number] DOB: September 01, 1940 DOA: 05/26/2017 PCP: Abner Greenspan, MD      Brief Narrative:  76 yo F with CHF, A. fib, CAD, HTN, BOOP admitted on 10/31 with SOB, was found to have MSSA pneumonia with acute respiratory failure --> intubation and extubated on 05/30/2017.  Later patient had another episode of respiratory distress requiring repeat intubation on 06/02/2017.  Finally extubated on 06/07/2017, but remained aphonic, delirious after second extubation. Bronchoscopy performed on 06/02/2017, culture grew MSSA patient completed antibiotic course on 06/11/2017.   Also was being treated for acute systolic CHF, Cardiology consulted and diuresed with IV Lasix, ultimately went for cardiac cath.  Cath 11/21 showed single vessel disease, stented.  Currently plan is continue monitoring for improvement in swalllowing, voice quality so we can remove the Cortrak and discharge to SNF.    Assessment & Plan:  Principal Problem:   Acute respiratory failure with hypercapnia (HCC) Active Problems:   Hyperlipidemia   Severe obesity (BMI >= 40) (HCC)   Essential hypertension   CAD, NATIVE VESSEL   OVERACTIVE BLADDER   Pulmonary nodules c/w Nodular BOOP   Acute on chronic systolic congestive heart failure (HCC)   COPD exacerbation (HCC)   Unstable angina (HCC)   Palliative care encounter   BOOP (bronchiolitis obliterans with organizing pneumonia) ([place])   Hypoxemia   1. Acute hypoxic respiratory failure from CHF, coronary ischemia Patient initially presented with respiratory failure, required intubation, treated with aggressive diuresis for CHF and Solu-medrol for BOOP.  Extubated after 5 days, but failed after 4 days and was reintubated from 11/7-11/12.  During second intubation developed MSSA pneumonia, now resolved.  Now off antibiotics, diuretics scaled back and back to home level O2 (2 L/m), home steroids from Pulmonary standpoint. Respiratory failure is mostly  now resolved, back on home O2. -Continue Lasix daily oral -Monitor I/Os closely  - As per pulmonology follow-up, continue prednisone 10 MG daily, she typically is on as outpatient and will exacerbate off prednisone. They recommended discharging home on prednisone 10 MG daily until follow-up with Dr. Melvyn Novas post discharge. - I personally discussed with Dr. Titus Mould, pulmonology on 11/27. Patient has had periods of bradycardia in the 30s in the early morning hours. As discussed with Dr. Benjamine Mola, cardiology, suspect this is due to sleep apnea. All agree and patient to be placed on AutoSet BiPAP at bedtime until formal outpatient sleep study. - Patient did not tolerate BiPAP overnight 11/28. As per spouse, patient has had a sleep study a few years ago which was negative. Continue home oxygen at 2 L/m.   2. Delirium: From hyperNa, hypercarbia, hypoxia, and ICU delirium. Improved greatly over the last few days. -Consulted Palliative Care, for help coordinating discharge planning, overall goals of care, appreciate input, they have begun to scale back as paitent has improved in last week. Continue delirium precautions:   -Lights and TV off, minimize interruptions at night  -Blinds open and lights on during day  -Glasses/hearing aid with patient  -Frequent reorientation  -PT/OT when able  -Avoid sedation medications/Beers list medications - Continues to improve. Occasional minimal confusion. As per RN, confused mostly at nights.  3. Dysphagia and aphonia Family have indicated PEG is not within patient's goals of care. MBS allowed patient to advance to DYS 2 and nectar thick. -Maintain Cortrak for now -SLP consult appreciated - As per discussion with RN and spouse at bedside today, patient continues to eat better. She tolerated almost  75% of breakfast this morning. Decision made to monitor overnight and if this trend continues, DC core track in a.m. 11/30.  4. Hearing loss: Patient without prior  hearing loss.  ?now if she is hard of hearing, or has persistent hypoactive delirium.   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 - ENT consultation 11/29 appreciated: Presbycusis, with superimposed conductive complement. Recommend one drop of mineral oil or similar in each ear once weekly to soften the wax, outpatient follow-up with Dr. Erik Obey in 2-3 weeks.  5.  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 fib with RVR, and CAD.  Echo showed EF 35%. Cath 11/21 showed single vessel disease, stented.   - Volume status stable. Cardiology following and adjusting medications. - Continue Lasix 40 mg daily, stopped hydralazine/Isordil on 11/29, stopped losartan on 11/29, started Entresto 24/26 bid on 11/28, continue Aldactone 25 MG daily, continue bisoprolol 2.5 MG daily (cardiology aware of transient bradycardia in the 40s while asleep and BP stable). Continue Plavix.   6. History of BOOP -Continue prednisone back to baseline . Pulmonology signed off 11/28. Steroid recommendations as above.  7. MSSA pneumonia: Completed antibiotics  8. Diabetes -Continue SSI. Reasonable inpatient control.  9. Severe protein calorie malnutrition -Continue tube feeds per NG. Apparently eating better and plan to DC core track when eating well consistently.  10. RUE DVT: UE DVT in setting of PICC.  Thrombocytopenia with negative HIT panel, avoiding heparins. -Continue Eliquis  11. Hypernatremia Resolved -Free water flushes -Daily BMP  12. Macrocytic anemia Mild. Stable.  13. Hypokalemia Mag nl. -Supplement. Resolved.  14. CAD: Status post DES to LAD and RCA 2011. Cardiac cath 11/21 with severe mid RCA  stenosis treated with DES. Cardiology suspects that this may have triggered ischemic flash pulmonary edema. She got Brillinta with cath but transitioned to Plavix with no aspirin (as she will need ongoing anticoagulation for RUE DVT). Has not tolerated Zocor or Crestor. Continue pravastatin 40 MG daily.  15. Acute kidney injury: Resolved.  16. Essential hypertension: Fluctuating and mildly uncontrolled at times. Meds as above. Monitor. Better.  17. NSVT: Remains on bisoprolol. No further episodes noted in the last 24 hours.  18. Bradycardia, periodic: Noted sinus bradycardia in the 30s overnight/early morning hours on 11/27 and 11/28. Discussed in detail with pulmonology and cardiology. Suspect related to sleep apnea. Patient did not tolerate BiPAP on 11/28  19. Suspected OSA/OHS: Did not tolerate BiPAP.  20. Anemia: Stable. Follow CBC periodically.  21. Thrombocytopenia: Seems to resolved but continue to monitor CBCs.     DVT prophylaxis: Eliquis Code Status: DNR Family Communication: None at bedside. Disposition Plan: Prolonged delirium, delaying ability to swallow, although much clearer in last few days, progressed to DYS 2 diet.  Continue to work with SLP, PT.  Hopefully to SNF 11/30 or 12/1. Discussed with clinical social worker and case management.   Consultants:   Cardiology  CCM-signed off 11/28  Palliative  Procedures:   Echocardiogram  Bronchoscopy  Intubation  Antimicrobials:   Ceftazidime 11/6 >> 11/10  Ceftriaxone 11/10 >> 11/16    Subjective: Reports feeling better. No cough, dyspnea or chest pain. As per spouse, patient tolerated 75% for breakfast. He reports slight  confusion at times. As per RN, confusion mostly at night.  Objective: Vitals:   06/24/17 0838 06/24/17 1100 06/24/17 1304 06/24/17 1325  BP: (!) 122/47  (!) 124/49   Pulse: 76  78   Resp:   20   Temp:   98.8 F (37.1 C)   TempSrc:   Oral   SpO2:    97%  Weight:  102.9 kg (226 lb  13.7 oz)    Height:        Intake/Output Summary (Last 24 hours) at 06/24/2017 1523 Last data filed at 06/24/2017 1003 Gross per 24 hour  Intake 118 ml  Output 250 ml  Net -132 ml   Filed Weights   06/22/17 0300 06/22/17 1127 06/24/17 1100  Weight: 113.9 kg (251 lb) 105.9 kg (233 lb 7.5 oz) 102.9 kg (226 lb 13.7 oz)    Examination: General appearance: Elderly obese adult female, sitting up comfortably in bed. Does not appear in any distress. HEENT: NG tube in right nostril. Oxygen via nasal cannula. Not much hoarseness appreciated. Cardiac: S1 and S2 heard, RRR. No JVD. Trace bilateral ankle edema. No murmurs. Telemetry: Mostly sinus rhythm. Had transient sinus bradycardia in the 40s at 11 last night and 7 this morning. Respiratory: Clear to auscultation. No increased work of breathing. Abdomen: Nondistended, soft and nontender. Normal bowel sounds heard. Stable without change. Neuro: Alert and oriented to person and place and partly to time. No focal neurological deficits. Psych: Pleasant and interactive. Answers questions appropriately.      Data Reviewed: I have personally reviewed following labs and imaging studies:  CBC: Recent Labs  Lab 06/18/17 0529 06/19/17 0330 06/20/17 0427 06/24/17 0406  WBC 12.5* 11.2* 11.3* 12.2*  NEUTROABS 10.1* 8.7* 8.6*  --   HGB 11.1* 9.7* 9.5* 10.6*  HCT 37.3 32.2* 32.0* 35.7*  MCV 98.7 99.7 100.3* 99.4  PLT 124* 103* 109* 030   Basic Metabolic Panel: Recent Labs  Lab 06/19/17 0330 06/20/17 0427 06/21/17 0332 06/22/17 0337 06/23/17 0422 06/24/17 0406  NA 143 138 140 142 139 140  K 2.9* 3.9 4.0 3.8 4.1 4.2  CL 99* 97* 96* 94* 91* 93*  CO2 36* 36* 39* 40* 39* 39*  GLUCOSE 116* 133* 113* 132* 122* 121*  BUN 22* 21* 25* 27* 25* 22*  CREATININE 0.71 0.66 0.71 0.67 0.68 0.81  CALCIUM 8.4* 8.4* 8.5* 9.0 9.2 9.1  MG 2.1  --   --   --   --   --    GFR: Estimated Creatinine Clearance: 67.9 mL/min (by C-G formula based on SCr of  0.81 mg/dL). Liver Function Tests: No results for input(s): AST, ALT, ALKPHOS, BILITOT, PROT, ALBUMIN in the last 168 hours. No results for input(s): LIPASE, AMYLASE in the last 168 hours. No results for input(s): AMMONIA in the last 168 hours. Coagulation Profile: No results for input(s): INR, PROTIME in the last 168 hours. Cardiac Enzymes: No results for input(s): CKTOTAL, CKMB, CKMBINDEX, TROPONINI in the last 168 hours. BNP (last 3 results) Recent Labs    01/11/17 1053  PROBNP 264.0*   HbA1C: No results for input(s): HGBA1C in the last 72 hours. CBG: Recent Labs  Lab 06/23/17 2008 06/24/17 0024 06/24/17 0348 06/24/17 0753 06/24/17 1205  GLUCAP 125* 114* 100* 92 161*   Lipid Profile: No results for input(s): CHOL, HDL, LDLCALC, TRIG, CHOLHDL, LDLDIRECT in the last 72 hours. Thyroid Function Tests: No results for input(s): TSH, T4TOTAL, FREET4, T3FREE, THYROIDAB in the last 72 hours. Anemia Panel:  No results for input(s): VITAMINB12, FOLATE, FERRITIN, TIBC, IRON, RETICCTPCT in the last 72 hours. Urine analysis:    Component Value Date/Time   COLORURINE YELLOW 06/18/2017 1116   APPEARANCEUR CLEAR 06/18/2017 1116   LABSPEC 1.017 06/18/2017 1116   PHURINE 7.0 06/18/2017 1116   GLUCOSEU NEGATIVE 06/18/2017 1116   HGBUR NEGATIVE 06/18/2017 1116   [place] 06/18/2017 1116   BILIRUBINUR neg. 06/28/2013 [place] 06/18/2017 1116   PROTEINUR NEGATIVE 06/18/2017 1116   UROBILINOGEN 0.2 06/28/2013 1041   UROBILINOGEN 0.2 07/30/2009 1435   NITRITE NEGATIVE 06/18/2017 1116   LEUKOCYTESUR NEGATIVE 06/18/2017 1116    Recent Results (from the past 240 hour(s))  Culture, Urine     Status: None   Collection Time: 06/18/17 11:09 AM  Result Value Ref Range Status   Specimen Description URINE, CLEAN CATCH  Final   Special Requests NONE  Final   Culture NO GROWTH  Final   Report Status 06/19/2017 FINAL  Final         Radiology Studies: No  results found.      Scheduled Meds: . apixaban  10 mg Oral BID   Followed by  . [START ON 06/25/2017] apixaban  5 mg Oral BID  . chlorhexidine  15 mL Mouth Rinse BID  . clopidogrel  75 mg Oral Daily  . cyanocobalamin  1,000 mcg Subcutaneous Daily  . ezetimibe  10 mg Oral QHS  . folic acid  1 mg Oral Daily  . free water  375 mL Per Tube Q8H  . furosemide  40 mg Oral Daily  . insulin aspart  0-20 Units Subcutaneous Q4H  . insulin glargine  26 Units Subcutaneous Daily  . ipratropium-albuterol  3 mL Nebulization TID  . mouth rinse  15 mL Mouth Rinse q12n4p  . mineral oil   OTIC (EAR) Weekly  . montelukast  10 mg Oral QHS  . pantoprazole  40 mg Oral Daily  . pravastatin  40 mg Oral q1800  . predniSONE  10 mg Oral Q breakfast  . sacubitril-valsartan  1 tablet Oral BID  . spironolactone  25 mg Oral Daily   Continuous Infusions: . feeding supplement (JEVITY 1.2 CAL) Stopped (06/24/17 1015)     LOS: 29 days    Time spent: 25 minutes    Vernell Leep, MD, FACP, [facility]. [facility] Pager [phone]  If 7PM-7AM, please contact night-coverage [email or web address] Password [facility] 06/24/2017, 3:23 PM

## 2017-06-25 DIAGNOSIS — Z515 Encounter for palliative care: Secondary | ICD-10-CM

## 2017-06-25 LAB — BASIC METABOLIC PANEL
Anion gap: 9 (ref 5–15)
BUN: 22 mg/dL — AB (ref 6–20)
CHLORIDE: 92 mmol/L — AB (ref 101–111)
CO2: 38 mmol/L — AB (ref 22–32)
CREATININE: 0.92 mg/dL (ref 0.44–1.00)
Calcium: 8.9 mg/dL (ref 8.9–10.3)
GFR calc Af Amer: >60 mL/min (ref >60)
GFR calc non Af Amer: 59 mL/min — ABNORMAL LOW (ref >60)
GLUCOSE: 111 mg/dL — AB (ref 65–99)
POTASSIUM: 3.9 mmol/L (ref 3.5–5.1)
Sodium: 139 mmol/L (ref 135–145)

## 2017-06-25 LAB — GLUCOSE, CAPILLARY
GLUCOSE-CAPILLARY: 117 mg/dL — AB (ref 65–99)
Glucose-Capillary: 153 mg/dL — ABNORMAL HIGH (ref 65–99)
Glucose-Capillary: 165 mg/dL — ABNORMAL HIGH (ref 65–99)
Glucose-Capillary: 80 mg/dL (ref 65–99)
Glucose-Capillary: 85 mg/dL (ref 65–99)
Glucose-Capillary: 93 mg/dL (ref 65–99)

## 2017-06-25 MED ORDER — POTASSIUM CHLORIDE CRYS ER 20 MEQ PO TBCR
20.0000 meq | EXTENDED_RELEASE_TABLET | Freq: Once | ORAL | Status: AC
  Administered 2017-06-25: 20 meq via ORAL
  Filled 2017-06-25: qty 1

## 2017-06-25 MED ORDER — INSULIN ASPART 100 UNIT/ML ~~LOC~~ SOLN
0.0000 [IU] | Freq: Three times a day (TID) | SUBCUTANEOUS | Status: DC
  Administered 2017-06-25: 2 [IU] via SUBCUTANEOUS
  Administered 2017-06-26: 1 [IU] via SUBCUTANEOUS
  Administered 2017-06-26: 3 [IU] via SUBCUTANEOUS

## 2017-06-25 MED ORDER — SACUBITRIL-VALSARTAN 49-51 MG PO TABS
1.0000 | ORAL_TABLET | Freq: Two times a day (BID) | ORAL | Status: DC
  Administered 2017-06-25 – 2017-06-26 (×2): 1 via ORAL
  Filled 2017-06-25 (×3): qty 1

## 2017-06-25 NOTE — Progress Notes (Signed)
Patient ID: Kelli Velasquez, female   DOB: 1941-06-25, 76 y.o.   MRN: [id number]      Advanced Heart Failure Rounding Note  Subjective:    Over 2 weeks prior to admission, she had increased dyspnea with exertion, she was short of breath at rest on the day of admission.  Her husband says she has been taking 20 mg of lasix daily and occasionally an extra 20 mg of lasix.   Presented to [facility] ED with respiratory distress. Family reported increased dyspnea over the week prior to admission. In ED she received solumedrol and duonebs. Placed on Bipap but continue decline requiring intubation. CXR concerning for interstitial edema and bilateral effusions.  Extubated 11/4. Noted to have swelling of upper airways on speech/swallow evaluation 11/6.  Developed respiratory distress 11/6 in the evening with hypoxia and hypercapnia and was re-intubated.    Extubated 06/07/17.   CXR 06/09/17 with LLL PNA.  She has completed abx for MSSA PNA.   UE [place] 06/14/17 with non-occlusive DVT in right axillary vein. Noted around PICC line in right basilic vein, and short segment of SVT noted in the forearm within the cephalic vein. LUE negative  BLE [place] 06/14/17 negative for DVTs.  LHC (11/21) with DES to RCA (95% mid RCA stenosis).   06/23/17 started on Entresto from losartan. BP in 110-130s overnight.  Hydral/isordil stopped 05/12/50   Systolic BP 025-852D overnight.   Feeling good this am. Voice improving and breathing stable. Per RN plan to pull cortrak today and discharge to SNF tomorrow.   Cardiac Studies: Echo 05/26/2017 EF 35-40%  Grade I DD RV normal Echo 2017 EF 45-50% with WMA Myoview 2017 No significant ischemia EF 48%.  Kelli Velasquez 2011- DES LAD and RCA.   Objective:   Weight Range: 226 lb 13.7 oz (102.9 kg) Body mass index is 38.94 kg/m.   Vital Signs:   Temp:  [98.3 F (36.8 C)-98.8 F (37.1 C)] 98.3 F (36.8 C) (11/30 0509) Pulse Rate:  [75-84] 75 (11/30 0509) Resp:  [14-20] 19 (11/30 0509) BP:  (124-155)/(24-142) 144/75 (11/30 0509) SpO2:  [94 %-99 %] 99 % (11/30 0839) Weight:  [226 lb 13.7 oz (102.9 kg)] 226 lb 13.7 oz (102.9 kg) (11/29 1100) Last BM Date: 06/24/17  Weight change: Filed Weights   06/22/17 0300 06/22/17 1127 06/24/17 1100  Weight: 251 lb (113.9 kg) 233 lb 7.5 oz (105.9 kg) 226 lb 13.7 oz (102.9 kg)   Intake/Output:   Intake/Output Summary (Last 24 hours) at 06/25/2017 1015 Last data filed at 06/25/2017 0831 Gross per 24 hour  Intake 10 ml  Output 900 ml  Net -890 ml    Physical Exam   General:Elderly and chronically ill appearing. No resp difficulty. HEENT: Normal x/ for cortrak Neck: Supple. JVP difficult. Carotids 2+ bilat; no bruits. No thyromegaly or nodule noted. Cor: PMI nondisplaced. RRR, No M/G/R noted Lungs: CTAB, normal effort. Abdomen: Soft, non-tender, non-distended, no HSM. No bruits or masses. +BS  Extremities: No cyanosis, clubbing, or rash. R and LLE no edema.  Neuro: Alert & orientedx3, cranial nerves grossly intact. moves all 4 extremities w/o difficulty. Affect pleasant   Telemetry   NSR 70-80s with occasional PACs, personally reviewed.   Labs    CBC Recent Labs    06/24/17 0406  WBC 12.2*  HGB 10.6*  HCT 35.7*  MCV 99.4  PLT 782   Basic Metabolic Panel Recent Labs    06/23/17 0422 06/24/17 0406  NA 139 140  K 4.1  4.2  CL 91* 93*  CO2 39* 39*  GLUCOSE 122* 121*  BUN 25* 22*  CREATININE 0.68 0.81  CALCIUM 9.2 9.1   Liver Function Tests No results for input(s): AST, ALT, ALKPHOS, BILITOT, PROT, ALBUMIN in the last 72 hours. No results for input(s): LIPASE, AMYLASE in the last 72 hours. Cardiac Enzymes No results for input(s): CKTOTAL, CKMB, CKMBINDEX, TROPONINI in the last 72 hours. BNP: BNP (last 3 results) Recent Labs    05/26/17 0610  BNP 760.9*   ProBNP (last 3 results) Recent Labs    01/11/17 1053  PROBNP 264.0*   D-Dimer No results for input(s): DDIMER in the last 72 hours. Hemoglobin  A1C No results for input(s): HGBA1C in the last 72 hours. Fasting Lipid Panel No results for input(s): CHOL, HDL, LDLCALC, TRIG, CHOLHDL, LDLDIRECT in the last 72 hours. Thyroid Function Tests No results for input(s): TSH, T4TOTAL, T3FREE, THYROIDAB in the last 72 hours.  Invalid input(s): FREET3  Other results:  Imaging   No results found.  Medications:     Scheduled Medications: . apixaban  5 mg Oral BID  . chlorhexidine  15 mL Mouth Rinse BID  . clopidogrel  75 mg Oral Daily  . cyanocobalamin  1,000 mcg Subcutaneous Daily  . ezetimibe  10 mg Oral QHS  . folic acid  1 mg Oral Daily  . free water  375 mL Per Tube Q8H  . furosemide  40 mg Oral Daily  . insulin aspart  0-20 Units Subcutaneous Q4H  . insulin glargine  26 Units Subcutaneous Daily  . ipratropium-albuterol  3 mL Nebulization TID  . mouth rinse  15 mL Mouth Rinse q12n4p  . mineral oil   OTIC (EAR) Weekly  . montelukast  10 mg Oral QHS  . pantoprazole  40 mg Oral Daily  . pravastatin  40 mg Oral q1800  . predniSONE  10 mg Oral Q breakfast  . sacubitril-valsartan  1 tablet Oral BID  . spironolactone  25 mg Oral Daily    Infusions: . feeding supplement (JEVITY 1.2 CAL) Stopped (06/24/17 1015)    PRN Medications: albuterol, Gerhardt's butt cream, lidocaine, lidocaine, lidocaine-EPINEPHrine, oxymetazoline, RESOURCE THICKENUP CLEAR, silver nitrate applicators, sodium chloride flush, TRIPLE ANTIBIOTIC  Patient Profile   Kelli Velasquez is a 76 year old with a history of CAD DES to LAD and RCA,  HTN, BOOP on chronic prednisone, and chronic respiratory failure.  Admitted with acute/chronic respiratory failure  Assessment/Plan   1. Acute/chronic systolic CHF: Ischemic cardiomyopathy given prior history.  Echo this admission with EF down to 35-40% range, around 50% in past.  She was admitted with worsening dyspnea, elevated BNP, CXR with pulmonary edema => LHC 11/21 showed tight RCA stenosis, so may have been  ischemia-mediated flash pulmonary edema.   - Volume status stable on exam.  - Continue lasix 40 mg daily.  - Increase Entresto to 49/51 mg BID. Creatinine stable.  - Continue spironolactone 25 mg daily.  - Would keep her on bisoprolol 2.5 mg daily.  If HR dips to 40s while asleep and BP stable, not concerning.  2. Acute hypoxemic respiratory failure: Initially thought multifactorial due to pulmonary edema, effusions, BOOP and OHS.  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  She was diuresed.  At baseline, she is on 2L home oxygen.  Extubated 11/4 but developed respiratory distress 11/6 and re-intubated.  She had swelling of the upper airways noted by speech/swallow exam.  Found to have MSSA PNA.  She has  now completed antibiotic course.   - Resolved. On 4L O2 by [place], using Bipap at night. No change.  3. CAD: h/o DES to LAD and RCA in 2011.  No chest pain but admitted with severe dyspnea/volume overload and fall in EF on echo.  Troponin mildly elevated but no trend. Cath 11/21 with severe mid RCA stenosis, treated with DES.  Suspect this may have triggered ischemic flash pulmonary edema.  - She got Brilinta with cath, have transitioned to Plavix with no ASA (as she will need ongoing anticoagulation for RUE DVT).  Not on ASA given anticoagulation need.  - She has not tolerated Zocor or Crestor.   - Continue low dose pravastatin 40 mg daily.  - No s/s of ischemia.    4.  AKI:  - Resolved. Stable thus far with med adjustments. BMET pending today.  5. HTN:  - Somewhat improved on Entresto. Increasing as above.  6. Code status: DNR/DNI at this time. No change. 7. NSVT: No recurrence. Keep on bisoprolol at low dose. No change.  8. DVT: RUE, triggered by PICC. Started on bivalirudin 06/14/17 on hematology recommendation (platelets had fallen but HIT negative by SRA).   - PICC has been removed. No change.  - Given triggered RUE DVT, would favor 3 months anticoagulation with DOAC => transitioned from bivalirudin  to Eliquis with DVT dosing.  - Decrease eliquis to 5 mg BID today as planned.  9. Delirium:  - Resolved..  Cardiac meds for discharge tomorrow - lasix 40 mg daily - Entresto 49/51 mg BID - Spiro 25 mg daily - Pravastatin 40 mg daily - Eliquis 5 mg BID  Will schedule follow up in HF clinic  Length of Stay: [place], [place]  06/25/2017 10:15 AM  Advanced Heart Failure Team Pager [phone] (M-F; 7a - 4p)  Please contact [facility] for night-coverage after hours (4p -7a ) and weekends on [email or web address]  Patient seen with PA, agree with the above note.   Reviewed telemetry with Dr. Algis Liming: she did have one long pause (4+ seconds) during the day, and has had nocturnal bradycardia.  The nocturnal events are likely related to OSA, but with the pause during the day, I will have her stop bisoprolol.  Will reassess ability to take this in the future.  She did not tolerate bipap.   Loralie Champagne 06/25/2017 1:12 PM

## 2017-06-25 NOTE — Care Management Important Message (Signed)
Important Message  Patient Details  Name: Kelli Velasquez MRN: [id number] Date of Birth: 1941-02-14   Medicare Important Message Given:  Yes    Nathen May 06/25/2017, 11:33 AM

## 2017-06-25 NOTE — Progress Notes (Signed)
Pt observed to be apneic and bradycardic for periods while sleeping. Discussed with pt that she should wear bipap at night and she agreed. Respiratory attempted to place bipap on pt and pt refused. Per respiratory, would need a different type of bipap to address sleep apnea. Pt is not in distress, will continue to monitor.

## 2017-06-25 NOTE — NC FL2 (Signed)
[place] LEVEL OF CARE SCREENING TOOL     IDENTIFICATION  Patient Name: Kelli Velasquez Birthdate: May 14, 1941 Sex: female Admission Date (Current Location): 05/26/2017  [facility] and [place] Number:  Herbalist and Address:  [facility], [place] [address], [place], [place] [address]      Provider Number: [id number]  Attending Physician Name and Address:  Modena Jansky, MD  Relative Name and Phone Number:       Current Level of Care: Hospital Recommended Level of Care: [place] Prior Approval Number:    Date Approved/Denied:   PASRR Number: [id number]  Discharge Plan: SNF    Current Diagnoses: Patient Active Problem List   Diagnosis Date Noted  . BOOP (bronchiolitis obliterans with organizing pneumonia) ([place])   . Hypoxemia   . Palliative care encounter   . Unstable angina ([place])   . Acute respiratory failure with hypercapnia ([place])   . COPD exacerbation ([place])   . Acute on chronic systolic congestive heart failure ([place])   . Encounter for screening mammogram for breast cancer 02/02/2017  . Chronic combined systolic and diastolic CHF, NYHA class 2 ([place]) 02/02/2017  . Depressed mood 12/02/2016  . Routine general medical examination at a health care facility 12/30/2015  . Supraumbilical hernia 02/54/2706  . Lump in the abdomen 04/10/2015  . GERD (gastroesophageal reflux disease) 10/10/2014  . B12 deficiency 10/09/2014  . Skin lesions 10/09/2014  . Cataracts, bilateral 10/09/2014  . Dry eyes 10/09/2014  . Fatigue 05/08/2014  . Pedal edema 05/08/2014  . Shoulder tendonitis 01/24/2014  . Osteopenia 11/28/2013  . Estrogen deficiency 11/02/2013  . Dyspnea on exertion 06/27/2013  . Hernia, incisional 04/03/2013  . 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 Cough 12/18/2012  . Chronic respiratory failure with hypoxia and hypercapnia ([place]) 10/26/2012  . Special screening for malignant neoplasms, colon  06/16/2011  . DYSPHONIA 10/10/2010  . HYPERGLYCEMIA 05/27/2010  . CAD, NATIVE VESSEL 08/19/2009  . 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 Severe obesity (BMI >= 40) ([place]) 09/01/2007  . MYOPIA 03/03/2007  . Hyperlipidemia 10/15/2006  . Essential hypertension 10/15/2006  . Allergic rhinitis 10/15/2006  . Cough variant asthma vs UACS  10/15/2006  . OVERACTIVE BLADDER 10/15/2006  . OSTEOARTHRITIS 10/15/2006  . Urinary incontinence 10/15/2006    Orientation RESPIRATION BLADDER Height & Weight     Self, Situation  O2(Nasal cannula 3L) Incontinent, External catheter Weight: 102.9 kg (226 lb 13.7 oz) Height:  5\' 4"  (162.6 cm)  BEHAVIORAL SYMPTOMS/MOOD NEUROLOGICAL BOWEL NUTRITION STATUS      Incontinent Diet(Please see DC Summary)  AMBULATORY STATUS COMMUNICATION OF NEEDS Skin   Extensive Assist Verbally Skin abrasions(Scratch on buttocks)                       Personal Care Assistance Level of Assistance  Bathing, Feeding, Dressing Bathing Assistance: Maximum assistance Feeding assistance: Limited assistance Dressing Assistance: Maximum assistance     Functional Limitations Info  Sight, Hearing, Speech Sight Info: Adequate Hearing Info: Adequate Speech Info: Adequate    SPECIAL CARE FACTORS FREQUENCY  PT (By licensed PT), OT (By licensed OT), Speech therapy     PT Frequency: 5x/week OT Frequency: 3x/week     Speech Therapy Frequency: 5x/week      Contractures Contractures Info: Not present    Additional Factors Info  Code Status, Allergies, Insulin Sliding Scale Code Status Info: DNR Allergies Info:  Fish Allergy, Shellfish  Allergy, Aspirin, Atorvastatin, Crestor Rosuvastatin Calcium, Penicillins, Simvastatin, Trandolapril   Insulin Sliding Scale Info: 3x daily       Current Medications (06/25/2017):  This is the current hospital active medication list Current Facility-Administered Medications  Medication Dose Route Frequency  Provider Last Rate Last Dose  . albuterol (PROVENTIL) (2.5 MG/3ML) 0.083% nebulizer solution 2.5 mg  2.5 mg Nebulization Q6H PRN Danford, Suann Larry, MD      . apixaban (ELIQUIS) tablet 5 mg  5 mg Oral BID Larey Dresser, MD   5 mg at 06/25/17 4765  . chlorhexidine (PERIDEX) 0.12 % solution 15 mL  15 mL Mouth Rinse BID Edwin Dada, MD   15 mL at 06/25/17 0951  . clopidogrel (PLAVIX) tablet 75 mg  75 mg Oral Daily Larey Dresser, MD   75 mg at 06/25/17 0951  . cyanocobalamin ((VITAMIN B-12)) injection 1,000 mcg  1,000 mcg Subcutaneous Daily Lavina Hamman, MD   1,000 mcg at 06/25/17 4650  . ezetimibe (ZETIA) tablet 10 mg  10 mg Oral QHS Edwin Dada, MD   10 mg at 06/24/17 2147  . folic acid (FOLVITE) tablet 1 mg  1 mg Oral Daily Danford, Suann Larry, MD   1 mg at 06/25/17 0951  . furosemide (LASIX) tablet 40 mg  40 mg Oral Daily Larey Dresser, MD   40 mg at 06/25/17 3546  . Gerhardt's butt cream   Topical QID PRN Vertis Kelch, NP   1 application at 56/81/27 0035  . insulin aspart (novoLOG) injection 0-9 Units  0-9 Units Subcutaneous TID WC Hongalgi, Anand D, MD      . insulin glargine (LANTUS) injection 26 Units  26 Units Subcutaneous Daily Lavina Hamman, MD   26 Units at 06/25/17 1033  . ipratropium-albuterol (DUONEB) 0.5-2.5 (3) MG/3ML nebulizer solution 3 mL  3 mL Nebulization TID Edwin Dada, MD   3 mL at 06/25/17 1321  . lidocaine (XYLOCAINE) 2 % jelly 1 application  1 application Topical Once PRN Jodi Marble, MD      . lidocaine (XYLOCAINE) 4 % external solution 0-50 mL  0-50 mL Topical Once PRN Jodi Marble, MD      . lidocaine-EPINEPHrine (XYLOCAINE-EPINEPHrine) 1 %-1:200000 (PF) injection 0-30 mL  0-30 mL Intradermal Once PRN Jodi Marble, MD      . MEDLINE mouth rinse  15 mL Mouth Rinse q12n4p Edwin Dada, MD   15 mL at 06/25/17 1533  . mineral oil liquid   OTIC (EAR) Weekly Jodi Marble, MD   30 mL at 06/24/17  2057  . montelukast (SINGULAIR) tablet 10 mg  10 mg Oral QHS Edwin Dada, MD   10 mg at 06/24/17 2147  . oxymetazoline (AFRIN) 0.05 % nasal spray 1 spray  1 spray Each Nare Once PRN Jodi Marble, MD      . pantoprazole (PROTONIX) EC tablet 40 mg  40 mg Oral Daily Edwin Dada, MD   40 mg at 06/25/17 0951  . pravastatin (PRAVACHOL) tablet 40 mg  40 mg Oral q1800 Larey Dresser, MD   40 mg at 06/24/17 1756  . predniSONE (DELTASONE) tablet 10 mg  10 mg Oral Q breakfast Edwin Dada, MD   10 mg at 06/25/17 5170  . RESOURCE THICKENUP CLEAR   Oral PRN Edwin Dada, MD      . sacubitril-valsartan (ENTRESTO) 49-51 mg per tablet  1 tablet Oral BID Shirley Friar, PA-C      .  silver nitrate applicators applicator 1 Stick  1 Stick Topical Once PRN Jodi Marble, MD      . sodium chloride flush (NS) 0.9 % injection 10-40 mL  10-40 mL Intracatheter PRN Danford, Suann Larry, MD   10 mL at 06/25/17 1536  . spironolactone (ALDACTONE) tablet 25 mg  25 mg Oral Daily Edwin Dada, MD   25 mg at 06/25/17 0951  . TRIPLE ANTIBIOTIC [phone] OINT 1 application  1 application Apply externally Once PRN Jodi Marble, MD         Discharge Medications: Please see discharge summary for a list of discharge medications.  Relevant Imaging Results:  Relevant Lab Results:   Additional Information [id number]  Diana Davenport S Hadasah Brugger, LCSWA

## 2017-06-25 NOTE — Progress Notes (Signed)
Pt observed to have multiple runs of every other beat PVC's. Notified MD on call. Pt is asymptomatic and resting. Will continue to monitor.

## 2017-06-25 NOTE — Progress Notes (Signed)
PROGRESS NOTE    Kelli Velasquez  BDZ:[id number] DOB: 1941/04/11 DOA: 05/26/2017 PCP: Abner Greenspan, MD      Brief Narrative:  76 yo F with CHF, A. fib, CAD, HTN, BOOP admitted on 10/31 with SOB, was found to have MSSA pneumonia with acute respiratory failure --> intubation and extubated on 05/30/2017.  Later patient had another episode of respiratory distress requiring repeat intubation on 06/02/2017.  Finally extubated on 06/07/2017, but remained aphonic, delirious after second extubation. Bronchoscopy performed on 06/02/2017, culture grew MSSA patient completed antibiotic course on 06/11/2017.   Also was being treated for acute systolic CHF, Cardiology consulted and diuresed with IV Lasix, ultimately went for cardiac cath.  Cath 11/21 showed single vessel disease, stented.  Currently plan is continue monitoring for improvement in swalllowing, voice quality so we can remove the Cortrak and discharge to SNF.    Assessment & Plan:  Principal Problem:   Acute respiratory failure with hypercapnia (HCC) Active Problems:   Hyperlipidemia   Severe obesity (BMI >= 40) (HCC)   Essential hypertension   CAD, NATIVE VESSEL   OVERACTIVE BLADDER   Pulmonary nodules c/w Nodular BOOP   Acute on chronic systolic congestive heart failure (HCC)   COPD exacerbation (HCC)   Unstable angina (HCC)   Palliative care encounter   BOOP (bronchiolitis obliterans with organizing pneumonia) ([place])   Hypoxemia   1. Acute hypoxic respiratory failure from CHF, coronary ischemia Patient initially presented with respiratory failure, required intubation, treated with aggressive diuresis for CHF and Solu-medrol for BOOP.  Extubated after 5 days, but failed after 4 days and was reintubated from 11/7-11/12.  During second intubation developed MSSA pneumonia, now resolved.  Now off antibiotics, diuretics scaled back and back to home level O2 (2 L/m), home steroids from Pulmonary standpoint. Respiratory failure is mostly  now resolved, back on home O2. - As per pulmonology follow-up, continue prednisone 10 MG daily, she typically is on as outpatient and will exacerbate off prednisone. They recommended discharging home on prednisone 10 MG daily until follow-up with Dr. Melvyn Novas post discharge. - I personally discussed with Dr. Titus Mould, pulmonology on 11/27. Patient has had periods of bradycardia in the 30s in the early morning hours. As discussed with Dr. Benjamine Mola, cardiology, suspect this is due to sleep apnea. All agree and patient to be placed on AutoSet BiPAP at bedtime until formal outpatient sleep study. - Patient did not tolerate BiPAP overnight 11/28 or 11/29. As per spouse, patient has had a sleep study a few years ago which was negative. Continue home oxygen at 2 L/m.   2. Delirium: From hyperNa, hypercarbia, hypoxia, and ICU delirium. Improved greatly over the last few days. -Consulted Palliative Care, for help coordinating discharge planning, overall goals of care, appreciate input, they have begun to scale back as paitent has improved in last week. Continue delirium precautions:   -Lights and TV off, minimize interruptions at night  -Blinds open and lights on during day  -Glasses/hearing aid with patient  -Frequent reorientation  -PT/OT when able  -Avoid sedation medications/[place] list medications - Continues to improve. Occasional minimal confusion. As per RN, confused mostly at nights.  3. Dysphagia and aphonia Family have indicated PEG is not within patient's goals of care. MBS allowed patient to advance to DYS 2 and nectar thick. -SLP consult appreciated - Oral intake continued to improve. -  Core track and tube feeding discontinued 11/30.  4. Hearing loss: Patient without prior hearing loss.  ?now if she is  hard of hearing, or has persistent hypoactive delirium.   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 - ENT consultation 11/29 appreciated: Presbycusis, with superimposed conductive componant. Recommend one drop of mineral oil (received 11/29) or similar in each ear once weekly to soften the wax, outpatient follow-up with Dr. Erik Obey in 2-3 weeks.  5.  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 fib with RVR, and CAD.  Echo showed EF 35%. Cath 11/21 showed single vessel disease, stented.   - Volume status stable. Cardiology following and adjusting medications. - Continue Lasix 40 mg daily, stopped hydralazine/Isordil on 11/29, stopped losartan on 11/29, started Entresto  and on 11/30 increased to 49/51 bid, continue Aldactone 25 MG daily, Continue Plavix. Bisoprolol discontinued due to issues with daytime bradycardia/prolonged pause (on 11/28, sinus bradycardia in the 50s with pause off 4+ seconds at about noontime, personally reviewed telemetry with Dr. Aundra Dubin on 11/30).  - D/W Dr. Aundra Dubin, Cardiology and cleared for discharge to SNF in a.m. and they will arrange outpatient follow-up.    6. History of BOOP -Continue prednisone back to baseline . Pulmonology signed off 11/28. Steroid recommendations as above.  7. MSSA pneumonia: Completed antibiotics  8. Diabetes -Continue SSI. Reasonable inpatient control.  9. Severe protein calorie malnutrition - Continues to eat better. Discontinue tube feeding 11/30.   10. RUE DVT: UE DVT in setting of PICC.  Thrombocytopenia with negative HIT panel, avoiding heparins. -Continue Eliquis, dose reduced to 5 MG twice a day on 11/30   11. Hypernatremia Resolved  12. Macrocytic anemia Mild. Stable.  13. Hypokalemia Mag nl. -Supplement. Resolved.  14. CAD: Status post DES to LAD and RCA 2011. Cardiac cath 11/21 with severe mid RCA stenosis  treated with DES. Cardiology suspects that this may have triggered ischemic flash pulmonary edema. She got Brillinta with cath but transitioned to Plavix with no aspirin (as she will need ongoing anticoagulation for RUE DVT). Has not tolerated Zocor or Crestor. Continue pravastatin 40 MG daily.  15. Acute kidney injury: Resolved.  16. Essential hypertension: Fluctuating and mildly uncontrolled at times. Meds as above. Monitor. Better.  17. NSVT: No episodes noted in the last 24 hours. Bisoprolol discontinued.  18. Bradycardia, periodic: nighttime bradycardia likely due to OSA. Also noted daytime bradycardia couple days ago and bisoprolol discontinued.   19. Suspected OSA/OHS: Did not tolerate BiPAP. outpatient sleep study and follow-up with pulmonology.   20. Anemia: Stable. Follow CBC periodically.  21. Thrombocytopenia: Seems to have resolved but continue to monitor CBCs.     DVT prophylaxis: Eliquis Code Status: DNR Family Communication: Discussed in detail with patient's spouse at bedside. Disposition Plan: Possible discharge to SNF 12/1. Clinical Education officer, museum where.    Consultants:   Cardiology  CCM-signed off 11/28  Palliative  Procedures:   Echocardiogram  Bronchoscopy  Intubation  Antimicrobials:   Ceftazidime 11/6 >> 11/10  Ceftriaxone 11/10 >> 11/16    Subjective: There was some concern initially by patient's RN and spouse that she was not arousable this morning. However when I went to the bedside, patient was wide awake and alert, oriented 2, answering questions appropriately. She denied complaints. She was joking that she was in "[place]". No dyspnea, chest  pain or cough reported. Continues to eat well. Having BMs. Ready to have NG tube removed.   Objective: Vitals:   06/25/17 0509 06/25/17 0800 06/25/17 0839 06/25/17 1339  BP: (!) 144/75   (!) 110/40  Pulse: 75   80  Resp: 19   20  Temp: 98.3 F (36.8 C)   98.2 F (36.8 C)  TempSrc: Oral    Oral  SpO2: 97% 100% 99% 97%  Weight:      Height:        Intake/Output Summary (Last 24 hours) at 06/25/2017 1456 Last data filed at 06/25/2017 1000 Gross per 24 hour  Intake 160 ml  Output 900 ml  Net -740 ml   Filed Weights   06/22/17 0300 06/22/17 1127 06/24/17 1100  Weight: 113.9 kg (251 lb) 105.9 kg (233 lb 7.5 oz) 102.9 kg (226 lb 13.7 oz)    Examination: General appearance: Elderly obese adult female, sitting up comfortably in bed. Does not appear in any distress. continues to improve HEENT: Hoarseness improved. Cardiac: S1 and S2 heard, RRR. No JVD or murmurs. Very trace bilateral ankle edema. Telemetry: Sinus rhythm. Noted brief sinus bradycardia in the 30s at midnight last night and 6 AM this morning. No NSVT.  Respiratory: Clear to auscultation. No increased work of breathing. stable.  Abdomen: Nondistended, soft and nontender. Normal bowel sounds heard. Stable. Neuro: Alert and oriented to person and place and partly to time. No focal neurological deficits. stable  Psych: Pleasant and interactive. Answers questions appropriately.      Data Reviewed: I have personally reviewed following labs and imaging studies:  CBC: Recent Labs  Lab 06/19/17 0330 06/20/17 0427 06/24/17 0406  WBC 11.2* 11.3* 12.2*  NEUTROABS 8.7* 8.6*  --   HGB 9.7* 9.5* 10.6*  HCT 32.2* 32.0* 35.7*  MCV 99.7 100.3* 99.4  PLT 103* 109* 952   Basic Metabolic Panel: Recent Labs  Lab 06/19/17 0330  06/21/17 0332 06/22/17 0337 06/23/17 0422 06/24/17 0406 06/25/17 1205  NA 143   < > 140 142 139 140 139  K 2.9*   < > 4.0 3.8 4.1 4.2 3.9  CL 99*   < > 96* 94* 91* 93* 92*  CO2 36*   < > 39* 40* 39* 39* 38*  GLUCOSE 116*   < > 113* 132* 122* 121* 111*  BUN 22*   < > 25* 27* 25* 22* 22*  CREATININE 0.71   < > 0.71 0.67 0.68 0.81 0.92  CALCIUM 8.4*   < > 8.5* 9.0 9.2 9.1 8.9  MG 2.1  --   --   --   --   --   --    < > = values in this interval not displayed.   GFR: Estimated  Creatinine Clearance: 59.8 mL/min (by C-G formula based on SCr of 0.92 mg/dL). Liver Function Tests: No results for input(s): AST, ALT, ALKPHOS, BILITOT, PROT, ALBUMIN in the last 168 hours. No results for input(s): LIPASE, AMYLASE in the last 168 hours. No results for input(s): AMMONIA in the last 168 hours. Coagulation Profile: No results for input(s): INR, PROTIME in the last 168 hours. Cardiac Enzymes: No results for input(s): CKTOTAL, CKMB, CKMBINDEX, TROPONINI in the last 168 hours. BNP (last 3 results) Recent Labs    01/11/17 1053  PROBNP 264.0*   HbA1C: No results for input(s): HGBA1C in the last 72 hours. CBG: Recent Labs  Lab 06/24/17 2011 06/25/17 0008 06/25/17 0405 06/25/17 0805 06/25/17 1147  GLUCAP 78  80 93 85 117*   Lipid Profile: No results for input(s): CHOL, HDL, LDLCALC, TRIG, CHOLHDL, LDLDIRECT in the last 72 hours. Thyroid Function Tests: No results for input(s): TSH, T4TOTAL, FREET4, T3FREE, THYROIDAB in the last 72 hours. Anemia Panel: No results for input(s): VITAMINB12, FOLATE, FERRITIN, TIBC, IRON, RETICCTPCT in the last 72 hours. Urine analysis:    Component Value Date/Time   COLORURINE YELLOW 06/18/2017 1116   APPEARANCEUR CLEAR 06/18/2017 1116   LABSPEC 1.017 06/18/2017 1116   PHURINE 7.0 06/18/2017 1116   GLUCOSEU NEGATIVE 06/18/2017 1116   HGBUR NEGATIVE 06/18/2017 1116   [place] 06/18/2017 1116   BILIRUBINUR neg. 06/28/2013 [place] 06/18/2017 1116   PROTEINUR NEGATIVE 06/18/2017 1116   UROBILINOGEN 0.2 06/28/2013 1041   UROBILINOGEN 0.2 07/30/2009 1435   NITRITE NEGATIVE 06/18/2017 1116   LEUKOCYTESUR NEGATIVE 06/18/2017 1116    Recent Results (from the past 240 hour(s))  Culture, Urine     Status: None   Collection Time: 06/18/17 11:09 AM  Result Value Ref Range Status   Specimen Description URINE, CLEAN CATCH  Final   Special Requests NONE  Final   Culture NO GROWTH  Final   Report Status  06/19/2017 FINAL  Final         Radiology Studies: No results found.      Scheduled Meds: . apixaban  5 mg Oral BID  . chlorhexidine  15 mL Mouth Rinse BID  . clopidogrel  75 mg Oral Daily  . cyanocobalamin  1,000 mcg Subcutaneous Daily  . ezetimibe  10 mg Oral QHS  . folic acid  1 mg Oral Daily  . free water  375 mL Per Tube Q8H  . furosemide  40 mg Oral Daily  . insulin aspart  0-20 Units Subcutaneous Q4H  . insulin glargine  26 Units Subcutaneous Daily  . ipratropium-albuterol  3 mL Nebulization TID  . mouth rinse  15 mL Mouth Rinse q12n4p  . mineral oil   OTIC (EAR) Weekly  . montelukast  10 mg Oral QHS  . pantoprazole  40 mg Oral Daily  . potassium chloride  20 mEq Oral Once  . pravastatin  40 mg Oral q1800  . predniSONE  10 mg Oral Q breakfast  . sacubitril-valsartan  1 tablet Oral BID  . spironolactone  25 mg Oral Daily   Continuous Infusions: . feeding supplement (JEVITY 1.2 CAL) Stopped (06/24/17 1015)     LOS: 30 days    Time spent: 25 minutes    Vernell Leep, MD, FACP, [facility]. [facility] Pager [phone]  If 7PM-7AM, please contact night-coverage [email or web address] Password [email or web address] 06/25/2017, 2:56 PM

## 2017-06-26 DIAGNOSIS — R55 Syncope and collapse: Secondary | ICD-10-CM | POA: Diagnosis not present

## 2017-06-26 DIAGNOSIS — I110 Hypertensive heart disease with heart failure: Secondary | ICD-10-CM | POA: Diagnosis not present

## 2017-06-26 DIAGNOSIS — E669 Obesity, unspecified: Secondary | ICD-10-CM | POA: Diagnosis present

## 2017-06-26 DIAGNOSIS — Z9841 Cataract extraction status, right eye: Secondary | ICD-10-CM | POA: Diagnosis not present

## 2017-06-26 DIAGNOSIS — N390 Urinary tract infection, site not specified: Secondary | ICD-10-CM | POA: Diagnosis not present

## 2017-06-26 DIAGNOSIS — E785 Hyperlipidemia, unspecified: Secondary | ICD-10-CM | POA: Diagnosis not present

## 2017-06-26 DIAGNOSIS — I5043 Acute on chronic combined systolic (congestive) and diastolic (congestive) heart failure: Secondary | ICD-10-CM | POA: Diagnosis not present

## 2017-06-26 DIAGNOSIS — E1169 Type 2 diabetes mellitus with other specified complication: Secondary | ICD-10-CM

## 2017-06-26 DIAGNOSIS — I5022 Chronic systolic (congestive) heart failure: Secondary | ICD-10-CM | POA: Diagnosis not present

## 2017-06-26 DIAGNOSIS — I509 Heart failure, unspecified: Secondary | ICD-10-CM | POA: Diagnosis not present

## 2017-06-26 DIAGNOSIS — I252 Old myocardial infarction: Secondary | ICD-10-CM | POA: Diagnosis not present

## 2017-06-26 DIAGNOSIS — X58XXXA Exposure to other specified factors, initial encounter: Secondary | ICD-10-CM | POA: Diagnosis present

## 2017-06-26 DIAGNOSIS — R1312 Dysphagia, oropharyngeal phase: Secondary | ICD-10-CM | POA: Diagnosis not present

## 2017-06-26 DIAGNOSIS — J8489 Other specified interstitial pulmonary diseases: Secondary | ICD-10-CM | POA: Diagnosis not present

## 2017-06-26 DIAGNOSIS — R8290 Unspecified abnormal findings in urine: Secondary | ICD-10-CM | POA: Diagnosis not present

## 2017-06-26 DIAGNOSIS — L89312 Pressure ulcer of right buttock, stage 2: Secondary | ICD-10-CM | POA: Diagnosis not present

## 2017-06-26 DIAGNOSIS — R2681 Unsteadiness on feet: Secondary | ICD-10-CM | POA: Diagnosis not present

## 2017-06-26 DIAGNOSIS — J9811 Atelectasis: Secondary | ICD-10-CM | POA: Diagnosis not present

## 2017-06-26 DIAGNOSIS — R1310 Dysphagia, unspecified: Secondary | ICD-10-CM | POA: Diagnosis not present

## 2017-06-26 DIAGNOSIS — I249 Acute ischemic heart disease, unspecified: Secondary | ICD-10-CM | POA: Diagnosis not present

## 2017-06-26 DIAGNOSIS — I10 Essential (primary) hypertension: Secondary | ICD-10-CM | POA: Diagnosis not present

## 2017-06-26 DIAGNOSIS — N179 Acute kidney failure, unspecified: Secondary | ICD-10-CM | POA: Diagnosis not present

## 2017-06-26 DIAGNOSIS — Z6839 Body mass index (BMI) 39.0-39.9, adult: Secondary | ICD-10-CM | POA: Diagnosis not present

## 2017-06-26 DIAGNOSIS — A419 Sepsis, unspecified organism: Secondary | ICD-10-CM | POA: Diagnosis not present

## 2017-06-26 DIAGNOSIS — Y92122 Bedroom in nursing home as the place of occurrence of the external cause: Secondary | ICD-10-CM | POA: Diagnosis not present

## 2017-06-26 DIAGNOSIS — Z9842 Cataract extraction status, left eye: Secondary | ICD-10-CM | POA: Diagnosis not present

## 2017-06-26 DIAGNOSIS — E860 Dehydration: Secondary | ICD-10-CM | POA: Diagnosis not present

## 2017-06-26 DIAGNOSIS — J15211 Pneumonia due to Methicillin susceptible Staphylococcus aureus: Secondary | ICD-10-CM | POA: Diagnosis not present

## 2017-06-26 DIAGNOSIS — Z961 Presence of intraocular lens: Secondary | ICD-10-CM | POA: Diagnosis not present

## 2017-06-26 DIAGNOSIS — R404 Transient alteration of awareness: Secondary | ICD-10-CM | POA: Diagnosis not present

## 2017-06-26 DIAGNOSIS — K219 Gastro-esophageal reflux disease without esophagitis: Secondary | ICD-10-CM | POA: Diagnosis not present

## 2017-06-26 DIAGNOSIS — M6281 Muscle weakness (generalized): Secondary | ICD-10-CM | POA: Diagnosis not present

## 2017-06-26 DIAGNOSIS — R531 Weakness: Secondary | ICD-10-CM | POA: Diagnosis not present

## 2017-06-26 DIAGNOSIS — J9621 Acute and chronic respiratory failure with hypoxia: Secondary | ICD-10-CM | POA: Diagnosis not present

## 2017-06-26 DIAGNOSIS — I5023 Acute on chronic systolic (congestive) heart failure: Secondary | ICD-10-CM | POA: Diagnosis not present

## 2017-06-26 DIAGNOSIS — E119 Type 2 diabetes mellitus without complications: Secondary | ICD-10-CM | POA: Diagnosis not present

## 2017-06-26 DIAGNOSIS — I951 Orthostatic hypotension: Secondary | ICD-10-CM | POA: Diagnosis not present

## 2017-06-26 DIAGNOSIS — R278 Other lack of coordination: Secondary | ICD-10-CM | POA: Diagnosis not present

## 2017-06-26 DIAGNOSIS — I2510 Atherosclerotic heart disease of native coronary artery without angina pectoris: Secondary | ICD-10-CM | POA: Diagnosis not present

## 2017-06-26 DIAGNOSIS — J9600 Acute respiratory failure, unspecified whether with hypoxia or hypercapnia: Secondary | ICD-10-CM | POA: Diagnosis not present

## 2017-06-26 DIAGNOSIS — Z973 Presence of spectacles and contact lenses: Secondary | ICD-10-CM | POA: Diagnosis not present

## 2017-06-26 DIAGNOSIS — E43 Unspecified severe protein-calorie malnutrition: Secondary | ICD-10-CM | POA: Diagnosis not present

## 2017-06-26 DIAGNOSIS — N289 Disorder of kidney and ureter, unspecified: Secondary | ICD-10-CM | POA: Diagnosis not present

## 2017-06-26 DIAGNOSIS — Z972 Presence of dental prosthetic device (complete) (partial): Secondary | ICD-10-CM | POA: Diagnosis not present

## 2017-06-26 DIAGNOSIS — J80 Acute respiratory distress syndrome: Secondary | ICD-10-CM | POA: Diagnosis not present

## 2017-06-26 DIAGNOSIS — G9341 Metabolic encephalopathy: Secondary | ICD-10-CM | POA: Diagnosis not present

## 2017-06-26 DIAGNOSIS — I959 Hypotension, unspecified: Secondary | ICD-10-CM | POA: Diagnosis not present

## 2017-06-26 LAB — BASIC METABOLIC PANEL
Anion gap: 8 (ref 5–15)
BUN: 23 mg/dL — AB (ref 6–20)
CALCIUM: 9.1 mg/dL (ref 8.9–10.3)
CHLORIDE: 95 mmol/L — AB (ref 101–111)
CO2: 39 mmol/L — AB (ref 22–32)
CREATININE: 0.94 mg/dL (ref 0.44–1.00)
GFR calc non Af Amer: 57 mL/min — ABNORMAL LOW (ref >60)
GLUCOSE: 77 mg/dL (ref 65–99)
Potassium: 3.8 mmol/L (ref 3.5–5.1)
Sodium: 142 mmol/L (ref 135–145)

## 2017-06-26 LAB — GLUCOSE, CAPILLARY
GLUCOSE-CAPILLARY: 92 mg/dL (ref 65–99)
Glucose-Capillary: 146 mg/dL — ABNORMAL HIGH (ref 65–99)
Glucose-Capillary: 224 mg/dL — ABNORMAL HIGH (ref 65–99)

## 2017-06-26 MED ORDER — INSULIN ASPART 100 UNIT/ML ~~LOC~~ SOLN: 0.0000 [IU] | Freq: Three times a day (TID) | SUBCUTANEOUS | Status: DC

## 2017-06-26 MED ORDER — PRAVASTATIN SODIUM 40 MG PO TABS: 40.0000 mg | ORAL_TABLET | Freq: Every day | ORAL | Status: DC

## 2017-06-26 MED ORDER — CLOPIDOGREL BISULFATE 75 MG PO TABS: 75.0000 mg | ORAL_TABLET | Freq: Every day | ORAL | Status: DC

## 2017-06-26 MED ORDER — FUROSEMIDE 40 MG PO TABS: 40.0000 mg | ORAL_TABLET | Freq: Every day | ORAL | Status: DC

## 2017-06-26 MED ORDER — PREDNISONE 10 MG PO TABS: 10.0000 mg | ORAL_TABLET | Freq: Every day | ORAL | Status: DC

## 2017-06-26 MED ORDER — GERHARDT'S BUTT CREAM: 1.0000 "application " | TOPICAL_CREAM | Freq: Four times a day (QID) | CUTANEOUS | Status: DC | PRN

## 2017-06-26 MED ORDER — MINERAL OIL PO OIL: TOPICAL_OIL | ORAL | Status: DC

## 2017-06-26 MED ORDER — INSULIN GLARGINE 100 UNIT/ML ~~LOC~~ SOLN: 22.0000 [IU] | Freq: Every day | SUBCUTANEOUS | Status: DC

## 2017-06-26 MED ORDER — SPIRONOLACTONE 25 MG PO TABS: 25.0000 mg | ORAL_TABLET | Freq: Every day | ORAL | Status: DC

## 2017-06-26 MED ORDER — SACUBITRIL-VALSARTAN 49-51 MG PO TABS: 1.0000 | ORAL_TABLET | Freq: Two times a day (BID) | ORAL | Status: DC

## 2017-06-26 MED ORDER — APIXABAN 5 MG PO TABS: 5.0000 mg | ORAL_TABLET | Freq: Two times a day (BID) | ORAL | Status: DC

## 2017-06-26 MED ORDER — RESOURCE THICKENUP CLEAR PO POWD: ORAL | Status: DC

## 2017-06-26 NOTE — Progress Notes (Signed)
Kelli Velasquez to be D/C'd to [facility] per MD order. Discussed with the patient and all questions fully answered. Report called to Larena Glassman, RN at [facility]. Allergies as of 06/26/2017      Reactions   Fish Allergy Anaphylaxis   Shellfish Allergy Anaphylaxis   Aspirin    REACTION: nausea and vomiting High doses   Atorvastatin    REACTION: leg pain   Crestor [rosuvastatin Calcium] Other (See Comments)   Muscle pain - allergy/intolerance   Penicillins    Due to mold allergy per pt   Simvastatin    REACTION: muscle pain   Trandolapril    REACTION: leg pain      Medication List    STOP taking these medications   acetaminophen-codeine 300-30 MG tablet Commonly known as:  TYLENOL #3   amLODipine 10 MG tablet Commonly known as:  NORVASC   aspirin 81 MG tablet   chlorpheniramine 4 MG tablet Commonly known as:  CHLOR-TRIMETON   cromolyn 5.2 MG/ACT nasal spray Commonly known as:  NASALCROM   dextromethorphan-guaiFENesin 30-600 MG 12hr tablet Commonly known as:  MUCINEX DM   potassium chloride SA 20 MEQ tablet Commonly known as:  KLOR-CON M20     TAKE these medications   acetaminophen 325 MG tablet Commonly known as:  TYLENOL Take 650 mg by mouth every 6 (six) hours as needed for mild pain. Take as needed per bottle   apixaban 5 MG Tabs tablet Commonly known as:  ELIQUIS Take 1 tablet (5 mg total) by mouth 2 (two) times daily.   clopidogrel 75 MG tablet Commonly known as:  PLAVIX Take 1 tablet (75 mg total) by mouth daily. Start taking on:  06/27/2017   cyanocobalamin 1000 MCG/ML injection Commonly known as:  (VITAMIN B-12) Inject 1,000 mcg into the muscle every 3 (three) months. What changed:  Another medication with the same name was removed. Continue taking this medication, and follow the directions you see here.   EPIPEN 2-PAK 0.3 mg/0.3 mL Soaj injection Generic drug:  EPINEPHrine INJECT 0.3 MLS (0.3 MG TOTAL) INTO THE MUSCLE ONCE AS NEEDED FOR ALLERGIC  REACTION   esomeprazole 40 MG capsule Commonly known as:  NEXIUM Take 1 capsule (40 mg total) by mouth daily.   ezetimibe 10 MG tablet Commonly known as:  ZETIA TAKE 1 TABLET (10 MG TOTAL) BY MOUTH DAILY.   furosemide 40 MG tablet Commonly known as:  LASIX Take 1 tablet (40 mg total) by mouth daily. What changed:    medication strength  how much to take  Another medication with the same name was removed. Continue taking this medication, and follow the directions you see here.   Gerhardt's butt cream Crea Apply 1 application topically 4 (four) times daily as needed for irritation.   insulin aspart 100 UNIT/ML injection Commonly known as:  novoLOG Inject 0-9 Units into the skin 3 (three) times daily with meals. CBG < 70: implement hypoglycemia protocol CBG 70 - 120: 0 units CBG 121 - 150: 1 unit CBG 151 - 200: 2 units CBG 201 - 250: 3 units CBG 251 - 300: 5 units CBG 301 - 350: 7 units CBG 351 - 400: 9 units CBG > 400: call MD.   insulin glargine 100 UNIT/ML injection Commonly known as:  LANTUS Inject 0.22 mLs (22 Units total) into the skin daily. Start taking on:  06/27/2017   mineral oil liquid Place in ear(s) once a week. 1 drop of mineral oil each ear once weekly to  soften ear wax. Start taking on:  07/01/2017   montelukast 10 MG tablet Commonly known as:  SINGULAIR Take 1 tablet (10 mg total) by mouth at bedtime.   multivitamin capsule Take 1 capsule by mouth daily.   OXYGEN Inhale 3 L into the lungs continuous. 24/7 2 lpm  [facility]   pravastatin 40 MG tablet Commonly known as:  PRAVACHOL Take 1 tablet (40 mg total) by mouth daily at 6 PM.   predniSONE 10 MG tablet Commonly known as:  DELTASONE Take 1 tablet (10 mg total) by mouth daily with breakfast. What changed:    medication strength  how much to take  when to take this  additional instructions   PROAIR HFA 108 (90 Base) MCG/ACT inhaler Generic drug:  albuterol INHALE 2 PUFFS BY MOUTH  EVERY 4 HOURS AS NEEDED FOR WHEEZING OR FOR SHORTNESS OF BREATH   ranitidine 75 MG tablet Commonly known as:  ZANTAC Take 300 mg by mouth at bedtime.   RESOURCE THICKENUP CLEAR Powd Oral, As needed, other   sacubitril-valsartan 49-51 MG Commonly known as:  ENTRESTO Take 1 tablet by mouth 2 (two) times daily.   spironolactone 25 MG tablet Commonly known as:  ALDACTONE Take 1 tablet (25 mg total) by mouth daily. Start taking on:  06/27/2017       VVS, Skin clean and dry. IV catheter discontinued intact. Site without signs and symptoms of complications. Dressing and pressure applied.  An After Visit Summary was printed and given to the patient.  Patient escorted via stretcher, and D/C home via [facility].  Melonie Florida  06/26/2017 5:30 PM

## 2017-06-26 NOTE — Care Management Note (Signed)
Case Management Note  Patient Details  Name: Kelli Velasquez MRN: [id number] Date of Birth: October 06, 1940  Subjective/Objective:                 Patient with order to discharge to [facility] (SNF). SNF discharge facilitated through [facility]). Please refer to [facility] notes for disposition plan and direct questions CSW on call accordingly. CM signing off   Action/Plan:   Expected Discharge Date:  06/26/17               Expected Discharge Plan:  [place]  In-House Referral:  NA, Clinical Social Work  Discharge planning Services  CM Consult  Post Acute Care Choice:    Choice offered to:     DME Arranged:    DME Agency:     HH Arranged:    [facility] Agency:     Status of Service:  Completed, signed off  If discussed at [employer] [facility] [employer], dates discussed:    Additional Comments:  Carles Collet, RN 06/26/2017, 12:21 PM

## 2017-06-26 NOTE — Discharge Summary (Addendum)
Physician Discharge Summary  Kelli Velasquez YQM:[id number] DOB: 1940/10/01  PCP: Abner Greenspan, MD  Admit date: 05/26/2017 Discharge date: 06/26/2017  Recommendations for Outpatient Follow-up:  1. M.D. at SNF in 3 days with repeat labs (CBC, BMP & A1c). 2. Shirley Friar, PA-C/A, Cardiology with advanced heart failure clinic on 07/06/17 at 9 AM. 3. Dr. Christinia Gully, Pulmonology in 2 weeks: SNF to assist with follow-up appointment. 4. Dr. Jodi Marble, ENT in 2 weeks: SNF to assist with follow-up appointment. 5. Dr. Loura Pardon, PCP upon discharge from SNF. 6. Recommend speech therapy evaluation and follow-up at SNF regarding advancing diet.  Home Health: None Equipment/Devices: None    Discharge Condition: Improved and stable  CODE STATUS: DO NOT RESUSCITATE  Diet recommendation: Dysphagia 2 diet & nectar thickened liquids with diabetic and heart healthy modifications.  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)   Hypoxemia   Palliative care by specialist   Brief Summary: 76 yo F with CHF, A. fib, CAD, HTN, BOOP admitted on10/31 with SOB, was found to have MSSA pneumonia with acute respiratory failure --> intubation and extubated on 05/30/2017.  Later patient had another episode of respiratory distress requiring repeat intubation on 06/02/2017. Finally extubated on 06/07/2017, but remained aphonic, delirious after second extubation. Bronchoscopy performed on 06/02/2017, culture grew MSSA patient completed antibiotic course on 06/11/2017.   Also  treated for acute systolic CHF, Cardiology consulted and diuresed with IV Lasix, ultimately went for cardiac cath.  Cath 11/21 showed single vessel disease, stented.   Assessment & Plan:  1. Acute hypoxic respiratory failure from CHF, coronary ischemia Patient initially presented with respiratory failure, required intubation, treated with aggressive diuresis for CHF and Solu-medrol for BOOP.  Extubated after 5 days, but failed after 4 days and was reintubated from 11/7-11/12.  During second intubation developed MSSA pneumonia, now resolved.  Now off antibiotics, diuretics scaled back and back to home level O2 (2 L/m), home steroids from Pulmonary standpoint. Respiratory failure is mostly now resolved, back on home O2. - As per pulmonology follow-up, continue prednisone 10 MG daily, she typically is on as outpatient and will exacerbate off prednisone. They recommended discharging home on prednisone 10 MG daily until follow-up with Dr. Melvyn Novas post discharge. - As discussed with cardiology and pulmonology, patient has episodic bradycardia in the 30s while asleep at night, strongly suspicious for sleep apnea. Patient however unable to tolerate BiPAP in the hospital. She will need sleep study evaluation as outpatient and further management by pulmonology. - As per spouse, patient has had a sleep study a few years ago which was negative. Continue home oxygen at 2 L/m.   2. Delirium: From hyperNa, hypercarbia, hypoxia, and ICU delirium. Almost resolved. -Consulted Palliative Care, for help coordinating discharge planning, overall goals of care, appreciate input, they have begun to scale back as paitent has improved in last week. Continue delirium precautions:              -Lights and TV off, minimize interruptions at night             -  Blinds open and lights on during day             -Glasses/hearing aid with patient             -Frequent reorientation             -PT/OT when able             -Avoid  sedation medications/Beers list medications  3. Dysphagia and aphonia Family have indicated PEG is not within patient's goals of care. MBS allowed patient to advance to DYS 2 and nectar thick. - She briefly had NG tube feeding which was discontinued and she is tolerating diet well. Recommend speech therapy follow-up at nursing home regarding advancing diet.  4. Hearing loss: Patient without prior hearing loss.  ?now if she is hard of hearing, or has persistent hypoactive delirium.  Spouse reports high dose Lasix a couple of weeks ago which is also known to cause hearing loss. 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 - ENT consultation 11/29 appreciated: Presbycusis, with superimposed conductive componant. Recommend one drop of mineral oil (received 11/29) or similar in each ear once weekly to soften the wax, outpatient follow-up with Dr. Erik Obey in 2-3 weeks.  5.  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 fib with RVR, and CAD.  Echo showed EF 35%. Cath 11/21 showed single vessel disease, stented.   - Volume status stable. Cardiology assisted with management. - Continue Lasix 40 mg daily, stopped hydralazine/Isordil on 11/29, stopped losartan on 11/29, started Entresto  and on 11/30 increased to 49/51 bid, continue Aldactone 25 MG daily, Continue Plavix. Bisoprolol discontinued due to issues with daytime bradycardia/prolonged pause (on 11/28, sinus bradycardia in the 30s with pause off 4+ seconds at about noontime, personally reviewed telemetry with Dr. Aundra Dubin on 11/30).  - D/W Dr. Aundra Dubin, Cardiology and cleared for discharge to SNF in a.m. and they have ranged outpatient follow-up.    6.  History of BOOP -Continue prednisone back to baseline . Pulmonology signed off 11/28. Steroid recommendations as above.  7. MSSA pneumonia: Completed antibiotics  8. Diabetes versus glucose intolerance -She was not on antidiabetic medications PTA. A1c 02/01/17:6.3. She was placed on Lantus and SSI in the hospital. CBG trends noted and seem quite tightly controlled. Thereby reduced Lantus on day of discharge from 26 units to 22 units daily, continue SSI. Monitor closely. Please check A1c at SNF.  9. Severe protein calorie malnutrition - Continues to eat better. Discontinue tube feeding 11/30.   10. RUE DVT: UE DVT in setting of PICC.  Thrombocytopenia with negative HIT panel, avoiding heparins. -Continue Eliquis, dose reduced to 5 MG twice a day on 11/30   11. Hypernatremia Resolved  12. Macrocytic anemia Mild. Stable.  13. Hypokalemia Mag nl. -Supplement. Resolved.  14. CAD: Status post DES to LAD and RCA 2011. Cardiac cath 11/21 with severe mid RCA stenosis treated with DES. Cardiology suspects that this may have triggered ischemic flash pulmonary edema. She got Brillinta with cath but transitioned to Plavix with no aspirin (as she will need ongoing anticoagulation for RUE DVT). Has not tolerated Zocor or Crestor. Continue pravastatin 40 MG daily.  15. Acute kidney injury: Resolved.  16. Essential  hypertension: Fluctuating and mildly uncontrolled at times. Meds as above. Monitor. Better.  17. NSVT: No episodes noted in the last 48 hours. Bisoprolol discontinued.  18. Bradycardia, periodic: nighttime bradycardia likely due to OSA. Also noted daytime bradycardia couple days ago and bisoprolol discontinued.   19. Suspected OSA/OHS: Did not tolerate BiPAP. Outpatient sleep study and follow-up with pulmonology.   20. Anemia: Stable. Follow CBC periodically.  21. Thrombocytopenia: Seems to have resolved but continue to monitor CBCs periodically.  22. Pulmonary  nodules: Seen on CT chest. Outpatient follow-up with pulmonology.     Consultants:   Cardiology  CCM  Palliative  Procedures:   Echocardiogram  Bronchoscopy  Intubation  Cor Track     Discharge Instructions  Discharge Instructions    (HEART FAILURE PATIENTS) Call MD:  Anytime you have any of the following symptoms: 1) 3 pound weight gain in 24 hours or 5 pounds in 1 week 2) shortness of breath, with or without a dry hacking cough 3) swelling in the hands, feet or stomach 4) if you have to sleep on extra pillows at night in order to breathe.   Complete by:  As directed    Call MD for:   Complete by:  As directed    Confusion   Call MD for:  difficulty breathing, headache or visual disturbances   Complete by:  As directed    Call MD for:  extreme fatigue   Complete by:  As directed    Call MD for:  persistant dizziness or light-headedness   Complete by:  As directed    Call MD for:  persistant nausea and vomiting   Complete by:  As directed    Call MD for:  severe uncontrolled pain   Complete by:  As directed    Call MD for:  temperature >100.4   Complete by:  As directed    Diet - low sodium heart healthy   Complete by:  As directed    Diet Carb Modified   Complete by:  As directed    Discharge instructions   Complete by:  As directed    Diet: Dysphagia 2 diet and nectar thickened liquids as per speech therapy recommendations. Diabetic and heart healthy modifications.   Increase activity slowly   Complete by:  As directed        Medication List    STOP taking these medications   acetaminophen-codeine 300-30 MG tablet Commonly known as:  TYLENOL #3   amLODipine 10 MG tablet Commonly known as:  NORVASC   aspirin 81 MG tablet   chlorpheniramine 4 MG tablet Commonly known as:  CHLOR-TRIMETON   cromolyn 5.2 MG/ACT nasal spray Commonly known as:  NASALCROM   dextromethorphan-guaiFENesin 30-600 MG 12hr tablet Commonly known as:  MUCINEX DM    potassium chloride SA 20 MEQ tablet Commonly known as:  KLOR-CON M20     TAKE these medications   acetaminophen 325 MG tablet Commonly known as:  TYLENOL Take 650 mg by mouth every 6 (six) hours as needed for mild pain. Take as needed per bottle   apixaban 5 MG Tabs tablet Commonly known as:  ELIQUIS Take 1 tablet (5 mg total) by mouth 2 (two) times daily.   clopidogrel 75 MG tablet Commonly known as:  PLAVIX Take 1 tablet (75 mg total) by mouth daily. Start taking on:  06/27/2017   cyanocobalamin 1000 MCG/ML injection Commonly known as:  (VITAMIN B-12) Inject 1,000 mcg into the muscle every 3 (three) months. What  changed:  Another medication with the same name was removed. Continue taking this medication, and follow the directions you see here.   EPIPEN 2-PAK 0.3 mg/0.3 mL Soaj injection Generic drug:  EPINEPHrine INJECT 0.3 MLS (0.3 MG TOTAL) INTO THE MUSCLE ONCE AS NEEDED FOR ALLERGIC REACTION   esomeprazole 40 MG capsule Commonly known as:  NEXIUM Take 1 capsule (40 mg total) by mouth daily.   ezetimibe 10 MG tablet Commonly known as:  ZETIA TAKE 1 TABLET (10 MG TOTAL) BY MOUTH DAILY.   furosemide 40 MG tablet Commonly known as:  LASIX Take 1 tablet (40 mg total) by mouth daily. What changed:    medication strength  how much to take  Another medication with the same name was removed. Continue taking this medication, and follow the directions you see here.   Gerhardt's butt cream Crea Apply 1 application topically 4 (four) times daily as needed for irritation.   insulin aspart 100 UNIT/ML injection Commonly known as:  novoLOG Inject 0-9 Units into the skin 3 (three) times daily with meals. CBG < 70: implement hypoglycemia protocol CBG 70 - 120: 0 units CBG 121 - 150: 1 unit CBG 151 - 200: 2 units CBG 201 - 250: 3 units CBG 251 - 300: 5 units CBG 301 - 350: 7 units CBG 351 - 400: 9 units CBG > 400: call MD.   insulin glargine 100 UNIT/ML  injection Commonly known as:  LANTUS Inject 0.22 mLs (22 Units total) into the skin daily. Start taking on:  06/27/2017   mineral oil liquid Place in ear(s) once a week. 1 drop of mineral oil each ear once weekly to soften ear wax. Start taking on:  07/01/2017   montelukast 10 MG tablet Commonly known as:  SINGULAIR Take 1 tablet (10 mg total) by mouth at bedtime.   multivitamin capsule Take 1 capsule by mouth daily.   OXYGEN Inhale 3 L into the lungs continuous. 24/7 2 lpm  [employer]   pravastatin 40 MG tablet Commonly known as:  PRAVACHOL Take 1 tablet (40 mg total) by mouth daily at 6 PM.   predniSONE 10 MG tablet Commonly known as:  DELTASONE Take 1 tablet (10 mg total) by mouth daily with breakfast. What changed:    medication strength  how much to take  when to take this  additional instructions   PROAIR HFA 108 (90 Base) MCG/ACT inhaler Generic drug:  albuterol INHALE 2 PUFFS BY MOUTH EVERY 4 HOURS AS NEEDED FOR WHEEZING OR FOR SHORTNESS OF BREATH   ranitidine 75 MG tablet Commonly known as:  ZANTAC Take 300 mg by mouth at bedtime.   RESOURCE THICKENUP CLEAR Powd Oral, As needed, other   sacubitril-valsartan 49-51 MG Commonly known as:  ENTRESTO Take 1 tablet by mouth 2 (two) times daily.   spironolactone 25 MG tablet Commonly known as:  ALDACTONE Take 1 tablet (25 mg total) by mouth daily. Start taking on:  06/27/2017       Contact information for follow-up providers    Shirley Friar, PA-C. Go on 07/06/2017.   Specialty:  Physician Assistant Why:  9:00 AM, [facility], parking code [address] information: [place] [address] [phone]        Abner Greenspan, MD. Schedule an appointment as soon as possible for a visit.   Specialties:  Family Medicine, Radiology Why:  Upon discharge from SNF. Contact information: [address] [place] [address] [phone]  M.D. at SNF.  Schedule an appointment as soon as possible for a visit in 3 day(s).   Why:  To be seen with repeat labs (CBC, BMP & A1c).       Tanda Rockers, MD. Schedule an appointment as soon as possible for a visit in 2 week(s).   Specialty:  Pulmonary Disease Contact information: [address] [place] [address] [phone]        Jodi Marble, MD. Schedule an appointment as soon as possible for a visit in 2 week(s).   Specialty:  Otolaryngology Contact information: [address] [place] [address] [phone]            Contact information for after-discharge care    Destination    [facility] Follow up.   Service:  Skilled Nursing Contact information: [place] [phone]                 Allergies  Allergen Reactions  . Fish Allergy Anaphylaxis  . Shellfish Allergy Anaphylaxis  . Aspirin     REACTION: nausea and vomiting High doses  . Atorvastatin     REACTION: leg pain  . Crestor [Rosuvastatin Calcium] Other (See Comments)    Muscle pain - allergy/intolerance  . Penicillins     Due to mold allergy per pt  . Simvastatin     REACTION: muscle pain  . Trandolapril     REACTION: leg pain      Procedures/Studies: Ct Head Wo Contrast  Result Date: 06/18/2017 CLINICAL DATA:  Initial evaluation for acute change in mental status. EXAM: CT HEAD WITHOUT CONTRAST TECHNIQUE: Contiguous axial images were obtained from the base of the skull through the vertex without intravenous contrast. COMPARISON:  Prior MRI from 06/12/2017. FINDINGS: Brain: Generalized age related cerebral atrophy, stable. 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 No acute intracranial hemorrhage. No acute large vessel territory infarct. No mass lesion, midline shift, or mass effect. No hydrocephalus. No other extra-axial fluid  collection. Vascular: No hyperdense vessel. Scattered vascular calcifications noted within the carotid siphons. Skull: Scalp soft tissues and calvarium within normal limits. Sinuses/Orbits: Globes and orbital soft tissues demonstrate no acute abnormality. Mild exophthalmos noted. Patient status post lens extraction bilaterally. Right sphenoid sinus disease again noted. Trace layering opacity noted within the left sphenoid sinus. Paranasal sinuses otherwise largely clear. Nasogastric tube in place. Chronic bilateral mastoid effusions. Other: None. IMPRESSION: 1. Stable appearance of the brain. No acute intracranial process identified. 2. Right sphenoid sinus disease with chronic bilateral mastoid effusions, stable. Electronically Signed   By: Jeannine Boga M.D.   On: 06/18/2017 19:26   Ct Head Wo Contrast  Result Date: 06/11/2017 CLINICAL DATA:  Altered mental status EXAM: CT HEAD WITHOUT CONTRAST TECHNIQUE: Contiguous axial images were obtained from the base of the skull through the vertex without intravenous contrast. COMPARISON:  None. FINDINGS: Brain: No hemorrhage or intracranial mass is visualized. Old appearing focus of encephalomalacia in the left frontal lobe. Asymmetric low attenuation within the left cerebellum white matter. Moderate atrophy. The ventricles are nonenlarged. Small old infarct in the left posterior white matter. Vascular: No hyperdense vessel.  Carotid artery calcification. Skull: No fracture.  Fluid within the bilateral mastoid air cells. Sinuses/Orbits: Mucosal thickening within the sphenoid and ethmoid sinuses. No acute orbital abnormality. Other: None IMPRESSION: 1. Negative for hemorrhage or mass lesion. 2. Asymmetric hypodensity within the left  cerebellum white matter tracts, given history of altered mental status, suggest MRI for further evaluation. 3. Atrophy Electronically Signed   By: Donavan Foil M.D.   On: 06/11/2017 22:13   Ct Chest Wo Contrast  Result Date:  06/02/2017 CLINICAL DATA:  Acute respiratory distress. EXAM: CT CHEST WITHOUT CONTRAST TECHNIQUE: Multidetector CT imaging of the chest was performed following the standard protocol without IV contrast. COMPARISON:  Portable chest obtained today. PET-CT dated 07/03/2013 and chest CT dated 06/15/2013. FINDINGS: Cardiovascular: Atheromatous arterial calcifications, including the coronary arteries and aorta. Enlarged heart. Mediastinum/Nodes: No enlarged lymph nodes. Endotracheal tube in satisfactory position. Nasogastric to extending into the stomach with its tip proximal to mid stomach. The included portions of the thyroid gland are unremarkable. Lungs/Pleura: The previously demonstrated bilateral lung nodules are no longer seen. There is an interval similar-appearing nodule in the posterior aspect of the left upper lobe, measuring 1.3 x 1.2 cm on image number 59 series 4. There is a 1.7 x 0.8 cm slightly less confluent oval nodular area in the more inferior left upper lobe as well on image number 67 series 4. Dense bilateral lower lobe and posterior right upper lobe atelectasis is demonstrated. Mild patchy prominence of the interstitial markings with mild progression. Upper Abdomen: Cholecystectomy clips. Musculoskeletal: Thoracic and lower cervical spine degenerative changes. IMPRESSION: 1. Resolution of previously demonstrated large number of moderately large lung nodules with an interval 1.3 cm similar-appearing nodule in in the posterior aspect of the left upper lobe and additional 1.7 cm similar-appearing nodule more inferiorly in the left upper lobe. This could represent focal infection or a neoplastic nodule. Non-contrast chest CT at 3-6 months is recommended. If nodules persist, subsequent management will be based upon the most suspicious nodule(s). This recommendation follows the consensus statement: Guidelines for Management of Incidental Pulmonary Nodules Detected on CT Images: From the Fleischner Society  2017; Radiology 2017; 284:228-243. 2. Dense bilateral lower lobe and posterior right upper lobe atelectasis and possible pneumonia. 3. Minimal interstitial pulmonary edema, pneumonitis or chronic interstitial lung disease. 4.  Calcific coronary artery and aortic atherosclerosis. 5. Cardiomegaly. Aortic Atherosclerosis (ICD10-I70.0). Electronically Signed   By: Claudie Revering M.D.   On: 06/02/2017 07:40   Mr Brain Wo Contrast  Result Date: 06/12/2017 CLINICAL DATA:  Initial evaluation for acute altered mental status. EXAM: MRI HEAD WITHOUT CONTRAST TECHNIQUE: Multiplanar, multiecho pulse sequences of the brain and surrounding structures were obtained without intravenous contrast. COMPARISON:  [facility] CT from 06/11/2017. FINDINGS: Brain: Study mildly degraded by motion artifact. Generalized age related cerebral atrophy. No significant cerebral white matter disease for age. 7 mm focus of T2 hyperintensity involving the cortical gray matter of the anterior left frontal lobe may reflect a small remote ischemic infarct (series 7, image 20). Tiny remote right cerebellar infarct. Cystic lucency within the deep white matter of the left parietal lobe favored to reflect a dilated perivascular space. No abnormal foci of restricted diffusion to suggest acute or subacute ischemia. Gray-white matter differentiation maintained. No evidence for acute or chronic intracranial hemorrhage. No mass lesion, midline shift or mass effect. No hydrocephalus. 3.6 cm extra-axial cystic lesion overlying the left frontal convexity most consistent with a arachnoid cyst. No associated edema or significant mass effect. No other extra-axial fluid collection. 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 Midline structures intact and normal. Vascular: Major intracranial vascular flow voids maintained. Skull and upper cervical spine: Craniocervical junction within normal limits. Multilevel degenerative spondylolysis noted within the cervical spine. Bone  marrow  signal intensity within normal limits. Hyperostosis frontalis interna noted. Scalp soft tissues within normal limits. Sinuses/Orbits: Globes oral soft tissues within normal limits. Patient status post lens extraction bilaterally. Chronic right sphenoid sinus disease. Paranasal sinuses otherwise clear. Bilateral mastoid effusions noted. Other: None. IMPRESSION: 1. No acute intracranial abnormality. 2. 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 3. 3.6 cm arachnoid cyst overlying the left frontal convexity without associated edema or mass effect. Finding felt to be incidental in nature, and of doubtful clinical significance in the acute setting. 4. Chronic right sphenoid sinus disease with bilateral mastoid effusions. Electronically Signed   By: Jeannine Boga M.D.   On: 06/12/2017 03:27   Dg Chest Port 1 View  Result Date: 06/15/2017 CLINICAL DATA:  Respiratory failure EXAM: PORTABLE CHEST 1 VIEW COMPARISON:  Yesterday FINDINGS: Right upper extremity PICC with tip at the SVC level. An orogastric tube reaches the stomach. Low volume chest with indistinct opacities at the bases. Possible cardiomegaly. No visible effusion or pneumothorax. IMPRESSION: Low volume chest with atelectasis or pneumonia at the bases. No change from yesterday. Electronically Signed   By: Monte Fantasia M.D.   On: 06/15/2017 09:10   Dg Chest Port 1 View  Result Date: 06/14/2017 CLINICAL DATA:  Respiratory failure EXAM: PORTABLE CHEST 1 VIEW COMPARISON:  Yesterday FINDINGS: Right arm PICC and feeding tube are stable. Cardiomegaly is stable. Bibasilar pulmonary opacity is stable. Pulmonary vascularity is within normal limits. IMPRESSION: Stable bibasilar atelectasis. Electronically Signed   By: Marybelle Killings M.D.   On: 06/14/2017 07:36   Dg Chest Port 1 View  Result Date: 06/13/2017 CLINICAL DATA:  Shortness of breath. EXAM: PORTABLE CHEST 1 VIEW COMPARISON:  Chest x-rays dated 06/11/2017 and 06/10/2017.  FINDINGS: Enteric tube appears well positioned with tip at the level of the pylorus/duodenal bulb. Right-sided PICC line appears appropriately positioned with tip at the level of the lower SVC/ cavoatrial junction. Study is hypoinspiratory with crowding of the perihilar and bibasilar bronchovascular markings. Persistent opacity at the left lung base, probable small pleural effusion and/or atelectasis. No new lung findings. No pneumothorax seen. Atherosclerotic changes again noted at the aortic arch. IMPRESSION: 1. No interval change. Persistent opacity at the left lung base, likely small left pleural effusion and/or atelectasis. Pneumonia not excluded if febrile. 2. Stable cardiomegaly. 3. Aortic atherosclerosis. Electronically Signed   By: Franki Cabot M.D.   On: 06/13/2017 14:37   Dg Chest Port 1 View  Result Date: 06/11/2017 CLINICAL DATA:  Shortness of breath EXAM: PORTABLE CHEST 1 VIEW COMPARISON:  06/10/2017, CT chest 06/02/2017, radiograph [id number] FINDINGS: Right upper extremity catheter tip overlies the cavoatrial junction. Esophageal tube tip overlies the gastroduodenal junction. Hypoventilatory changes. Small left pleural effusion. Minimal atelectasis at the right base. No change in consolidation at the left base. Stable cardiomegaly with atherosclerosis. IMPRESSION: 1. No significant interval change in small left pleural effusion and left basilar atelectasis or pneumonia 2. Hypoventilatory changes 3. Cardiomegaly Electronically Signed   By: Donavan Foil M.D.   On: 06/11/2017 23:00   Dg Chest Port 1 View  Result Date: 06/10/2017 CLINICAL DATA:  Aspiration pneumonia EXAM: PORTABLE CHEST 1 VIEW COMPARISON:  06/09/2017 FINDINGS: A feeding tube extends into the stomach. Right-sided PICC line tip: Lower SVC. The patient is rotated to the left on today's radiograph, reducing diagnostic sensitivity and specificity. Low lung volumes are present, causing crowding of the pulmonary vasculature.  Atherosclerotic calcification of the aortic arch. Retrocardiac and left basilar airspace opacity noted with  mild right retro diaphragmatic airspace opacity potentially from aspiration pneumonia or atelectasis. IMPRESSION: 1. Essentially stable bibasilar airspace opacities potentially from aspiration pneumonia or atelectasis. 2. Low lung volumes. 3. Cardiomegaly. 4.  Aortic Atherosclerosis (ICD10-I70.0). Electronically Signed   By: Van Clines M.D.   On: 06/10/2017 07:15   Dg Chest Port 1 View  Result Date: 06/09/2017 CLINICAL DATA:  Status postextubation.  Airspace consolidation EXAM: PORTABLE CHEST 1 VIEW COMPARISON:  June 07, 2017. FINDINGS: Endotracheal tube has been removed. There is a feeding tube with tip in the region of the first portion of the duodenum. There is a central catheter with tip at the cavoatrial junction. No pneumothorax. There are small pleural effusions bilaterally. There is airspace consolidation in the left lower lobe, similar to most recent study. There is atelectasis in the right base. There is cardiomegaly with pulmonary vascularity within normal limits. There is aortic atherosclerosis. No adenopathy. No bone lesions. IMPRESSION: Tube and catheter positions as described without pneumothorax. Airspace consolidation left lower lobe with small bilateral pleural effusions. Right base atelectasis present. Stable cardiomegaly. There is aortic atherosclerosis. Aortic Atherosclerosis (ICD10-I70.0). Electronically Signed   By: Lowella Grip III M.D.   On: 06/09/2017 07:03   Dg Chest Port 1 View  Result Date: 06/07/2017 CLINICAL DATA:  Acute respiratory failure with hypoxia. EXAM: PORTABLE CHEST 1 VIEW COMPARISON:  One-view chest x-ray 06/03/2017 FINDINGS: The heart is enlarged. Aortic atherosclerosis is again seen. Endotracheal tube is stable, 4.5 cm above the carina. The NG tube courses off the inferior border of the film. Mild pulmonary vascular congestion and left  greater than right pleural effusions are stable. Associated atelectasis is noted. Infection or aspiration at the left base is not excluded, although there is no significant change. IMPRESSION: 1. Stable appearance of cardiomegaly with mild pulmonary vascular congestion and left greater than right pleural effusions. This likely represents congestive heart failure. 2. Left basilar airspace disease likely represents atelectasis. Infection or aspiration is not excluded. 3. Support apparatus is stable. 4. Aortic atherosclerosis. Electronically Signed   By: San Morelle M.D.   On: 06/07/2017 07:03   Dg Chest Port 1 View  Result Date: 06/03/2017 CLINICAL DATA:  76 year old female with hypoxia. EXAM: PORTABLE CHEST 1 VIEW COMPARISON:  06/02/2017 FINDINGS: There has been interval intubation, with the endotracheal tube approximately 3 cm above the level of the carina. A right-sided PICC line terminates at the mid SVC. Cardiomediastinal silhouette is enlarged but grossly stable. Note is made of shallow inspiration with bronchovascular crowding. There is a likely left basilar effusion and associated atelectasis. Superimposed consolidation not excluded. Additional right middle lobe opacity, similar to prior exam, also may represent infiltrate. No pneumothorax. No acute osseous abnormalities. IMPRESSION: 1. Interval intubation, with the endotracheal tube approximately 3 cm above the level of the carina. Right-sided PICC. 2. Bibasilar opacities suggesting left pleural effusion and atelectasis, with or without superimposed consolidation. 3. Stable cardiomegaly. Electronically Signed   By: Kristopher Oppenheim M.D.   On: 06/03/2017 07:13   Dg Chest Port 1 View  Result Date: 06/02/2017 CLINICAL DATA:  Shortness of breath EXAM: PORTABLE CHEST 1 VIEW COMPARISON:  05/31/2017 FINDINGS: Shallow inspiration with atelectasis or consolidation in both lung bases. Probable bilateral pleural effusions. Cardiac enlargement. Pulmonary  vascularity is normal. No pneumothorax. Calcification of the aorta. IMPRESSION: Shallow inspiration with infiltration or atelectasis in the lung bases. Probable small pleural effusions. Cardiac enlargement. Electronically Signed   By: Lucienne Capers M.D.   On: 06/02/2017 01:12  Dg Chest Port 1 View  Result Date: 05/31/2017 CLINICAL DATA:  CHF.  Shortness of breath EXAM: PORTABLE CHEST 1 VIEW COMPARISON:  Yesterday FINDINGS: Low volume chest with hazy opacities at the bases. There is cardiomegaly that is prominent. Vascular pedicle widening. No visible Kerley lines. No pneumothorax. IMPRESSION: Lower volumes after extubation. Extensive lower lobe atelectasis or consolidation. Electronically Signed   By: Monte Fantasia M.D.   On: 05/31/2017 07:43   Dg Chest Port 1 View  Result Date: 05/30/2017 CLINICAL DATA:  76 year old female presenting with respiratory failure an altered mental status. History of chronic prednisone for BOOP. EXAM: PORTABLE CHEST 1 VIEW COMPARISON:  05/29/2017 and earlier. FINDINGS: Portable AP semi upright view at 0450 hours. Stable endotracheal tube tip just below the clavicles. Enteric tube courses to the abdomen, side hole up the level of the stomach. Stable lung volumes. Stable cardiac size and mediastinal contours. Continued confluent in veiling opacities at both lung bases. Stable vascularity without edema. No pneumothorax. No areas of worsening ventilation. IMPRESSION: 1.  Stable lines and tubes. 2. Stable ventilation with bibasilar opacity compatible with small pleural effusions plus collapse or consolidation. Electronically Signed   By: Genevie Ann M.D.   On: 05/30/2017 06:57   Dg Chest Port 1 View  Result Date: 05/29/2017 CLINICAL DATA:  History of ETT. EXAM: PORTABLE CHEST 1 VIEW COMPARISON:  May 28, 2017 FINDINGS: The ETT terminates 3 cm above the carina. The NG tube terminates below today's film. No pneumothorax. A right-sided pleural effusion with underlying opacity is  stable. A probable small effusion on the left with underlying opacity is stable. No other interval changes. Stable cardiomegaly and cardiomediastinal silhouette. IMPRESSION: 1. Bilateral pleural effusions with underlying opacities as above. 2. Stable support apparatus. 3. No other change. Electronically Signed   By: Dorise Bullion III M.D   On: 05/29/2017 08:04   Dg Chest Port 1 View  Result Date: 05/28/2017 CLINICAL DATA:  Respiratory difficulty EXAM: PORTABLE CHEST 1 VIEW COMPARISON:  Yesterday FINDINGS: Endotracheal and NG tubes stable. Low volumes. Stable vascular congestion. Bibasilar opacity likely combination of pleural fluid and consolidation is stable. Cardiomegaly. No pneumothorax. IMPRESSION: Stable. Bibasilar consolidation and bilateral pleural effusions are stable. Electronically Signed   By: Marybelle Killings M.D.   On: 05/28/2017 07:53   Dg Abd Portable 1v  Result Date: 06/08/2017 CLINICAL DATA:  Feeding tube placement. EXAM: PORTABLE ABDOMEN - 1 VIEW COMPARISON:  06/02/2017 . FINDINGS: Feeding tube noted with tip projected over the duodenum. Surgical clips right upper quadrant. No bowel distention or free air. Degenerative changes thoracolumbar spine. Basilar atelectasis . IMPRESSION: Feeding tube noted with tip projected over the duodenum. No bowel distention. Electronically Signed   By: Marcello Moores  Register   On: 06/08/2017 12:58   Dg Abd Portable 1v  Result Date: 06/02/2017 CLINICAL DATA:  OG tube placement EXAM: PORTABLE ABDOMEN - 1 VIEW COMPARISON:  05/29/2017 FINDINGS: Enteric tube tip is in the left upper quadrant consistent with location in the body of the stomach. Gas-filled small and large bowel without distention may indicate ileus. Surgical clips in the right upper quadrant. IMPRESSION: Enteric tube tip is in the left upper quadrant consistent with location in the body of the stomach. Electronically Signed   By: Lucienne Capers M.D.   On: 06/02/2017 04:06   Dg Abd Portable  1v  Result Date: 05/29/2017 CLINICAL DATA:  Of OG tube placement. EXAM: PORTABLE ABDOMEN - 1 VIEW COMPARISON:  None. FINDINGS: The OG tube terminates in  the left upper quadrant, likely within the stomach. Probable small effusion and atelectasis in the left base. IMPRESSION: The OG tube terminates in the left upper quadrant. Electronically Signed   By: Dorise Bullion III M.D   On: 05/29/2017 11:02   Dg Swallowing Func-speech Pathology  Result Date: 06/22/2017 Objective Swallowing Evaluation: Type of Study: MBS-Modified Barium Swallow Study  Patient Details Name: ROE KOFFMAN MRN: [id number] Date of Birth: 1941-06-02 Today's Date: 06/22/2017 Time: SLP Start Time (ACUTE ONLY): 5284 -SLP Stop Time (ACUTE ONLY): 1324 SLP Time Calculation (min) (ACUTE ONLY): 16 min Past Medical History: Past Medical History: Diagnosis Date . Allergy   allergic rhinitis . Asthma  . Chronic bronchitis ([place])  . Coronary artery disease   cath January 2011 with DEs LAD and RCA . Dyspnea  . Full dentures  . Gallstones  . GERD (gastroesophageal reflux disease)  . History of echocardiogram   Echo 5/17: mod LVH, EF 50-55%, ant-septal HK, Gr 1 DD, mod LAE //  b.  Echo 7/17: mild concentric LVH, EF 45-50%, inf-lat, inf, inf-septal HK, mild LAE . History of nuclear stress test   Myoview 7/17: EF 48%, small mild apical defect, no ischemia, low risk . Hyperlipidemia  . Hypertension  . 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 Myopia  . 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 Nocturnal oxygen desaturation   o2 at night . Obesity  . Osteoarthritis   knees, fingers, shoulders . Overactive bladder  . Oxygen deficiency   uses oxygen all day . Rash   and other non specific skin eruptions . Retaining fluid   in ankles and feet . Urinary incontinence  . Vertigo  . Wears glasses  Past Surgical History: Past Surgical History: Procedure Laterality Date . CATARACT EXTRACTION W/PHACO Right  12/16/2016  Procedure: CATARACT EXTRACTION PHACO AND INTRAOCULAR LENS PLACEMENT ([place])  right;  Surgeon: Leandrew Koyanagi, MD;  Location: [facility];  Service: Ophthalmology;  Laterality: Right; . CATARACT EXTRACTION W/PHACO Left 02/03/2017  Procedure: CATARACT EXTRACTION PHACO AND INTRAOCULAR LENS PLACEMENT ([place])  Left  Complicated;  Surgeon: Leandrew Koyanagi, MD;  Location: [facility];  Service: Ophthalmology;  Laterality: Left;  [place]ugin Uses oxygen  . CHOLECYSTECTOMY  92 . COLONOSCOPY   . CORONARY ANGIOGRAPHY N/A 06/16/2017  Procedure: CORONARY ANGIOGRAPHY;  Surgeon: Larey Dresser, MD;  Location: [facility];  Service: Cardiovascular;  Laterality: N/A; . CORONARY ANGIOPLASTY WITH STENT PLACEMENT  07/2009  stent . CORONARY STENT INTERVENTION N/A 06/16/2017  Procedure: CORONARY STENT INTERVENTION;  Surgeon: Wellington Hampshire, MD;  Location: [facility];  Service: Cardiovascular;  Laterality: N/A; . DILATION AND CURETTAGE OF UTERUS  1984 . INCISIONAL HERNIA REPAIR N/A 05/16/2013  Procedure: HERNIA REPAIR INFRAUMILICAL INCISIONAL;  Surgeon: Joyice Faster. Cornett, MD;  Location: [facility];  Service: General;  Laterality: N/A;  umbilical . INSERTION OF MESH N/A 05/16/2013  Procedure: INSERTION OF MESH;  Surgeon: Joyice Faster. Cornett, MD;  Location: [facility];  Service: General;  Laterality: N/A;  umbilical . JOINT REPLACEMENT  2007  right total knee replacement . RIGHT/LEFT HEART CATH AND CORONARY ANGIOGRAPHY N/A 06/16/2017  Procedure: RIGHT/LEFT HEART CATH AND CORONARY ANGIOGRAPHY;  Surgeon: Larey Dresser, MD;  Location: [facility];  Service: Cardiovascular;  Laterality: N/A; . TOTAL KNEE ARTHROPLASTY  2010  left , then vocal cord infection post op . VIDEO BRONCHOSCOPY Bilateral 07/05/2013  Procedure:  VIDEO BRONCHOSCOPY WITH FLUORO;  Surgeon: Tanda Rockers, MD;  Location: [facility];  Service: Cardiopulmonary;  Laterality: Bilateral; .  vocal cord polypectomy   HPI: 76yo female former smoker (quit 1984) with hx CAD, HTN, CHF, BOOP followed by Dr. Melvyn Novas on chronic prednisone presented 10/31 with progressive SOB, cough, wheezing, BLE edema. She had significant respiratory distress in ER and failed trial bipap requiring intubation 10/31-11/4. FEES 11/6 showed severe edema and weakness, standing secretions, and diffuse residue that could not be cleared. Recommendations at that time were to remain NPO except for a few ice chips given after oral care. Pt was reintubated 11/7-11/12 and stayed on BiPAP post-extubation. SLP was consulted to reassess swallowing.  Subjective: pt alert, remains confused but improving Assessment / Plan / Recommendation CHL IP CLINICAL IMPRESSIONS 06/22/2017 Clinical Impression Pt has improving oropharyngeal function as her overall respiratory status, phonation (glottal closure), and mentation continue to progress. She has what appears to be a cognitively-based oral dysphagia, marked by oral holding, lingual rocking, and mildly prolonged mastication. Her swallow triggers at the valleculae with all consistencies with brief pooling before the swallow. Her pharyngeal clearance is good, but mildly decreased hyolaryngeal movement and decreased glottal closure results in trace penetration of nectar thick liquids on the first few cup sips. This cleared easily with a cued throat clear, and as the study progressed, her airway protection improved - even with consecutive cup/straw sips. She did still have deeper penetration to the vocal folds with thin liquids. She triggered a throat clear once it reached the vocal folds, which seemed to clear most but not all of the penetrates from her laryngeal vestibule. Given her sensitivity to aspiration this admission and her still altered mentation, recommend to start more conservatively with Dys 2 textures and nectar thick liquids. As she continues to progress cognitively, and potentially as the  NGT is removed, she has good prognosis for further advancement. SLP Visit Diagnosis Dysphagia, oropharyngeal phase (R13.12) Attention and concentration deficit following -- Frontal lobe and executive function deficit following -- Impact on safety and function Mild aspiration risk;Moderate aspiration risk   CHL IP TREATMENT RECOMMENDATION 06/22/2017 Treatment Recommendations Therapy as outlined in treatment plan below   Prognosis 06/22/2017 Prognosis for Safe Diet Advancement Good Barriers to Reach Goals Cognitive deficits Barriers/Prognosis Comment -- CHL IP DIET RECOMMENDATION 06/22/2017 SLP Diet Recommendations Dysphagia 2 (Fine chop) solids;Nectar thick liquid Liquid Administration via Cup;Straw Medication Administration Whole meds with puree Compensations Slow rate;Small sips/bites;Clear throat intermittently Postural Changes Seated upright at 90 degrees   CHL IP OTHER RECOMMENDATIONS 06/22/2017 Recommended Consults -- Oral Care Recommendations Oral care BID Other Recommendations --   CHL IP FOLLOW UP RECOMMENDATIONS 06/22/2017 Follow up Recommendations Skilled Nursing facility   [facility] IP FREQUENCY AND DURATION 06/22/2017 Speech Therapy Frequency (ACUTE ONLY) min 2x/week Treatment Duration 2 weeks      CHL IP ORAL PHASE 06/22/2017 Oral Phase Impaired Oral - Pudding Teaspoon -- Oral - Pudding Cup -- Oral - Honey Teaspoon NT Oral - Honey Cup -- Oral - Nectar Teaspoon Delayed oral transit Oral - Nectar Cup Delayed oral transit Oral - Nectar Straw Delayed oral transit Oral - Thin Teaspoon NT Oral - Thin Cup Delayed oral transit Oral - Thin Straw -- Oral - Puree Delayed oral transit Oral - Mech Soft Impaired mastication Oral - Regular -- Oral - Multi-Consistency -- Oral - Pill -- Oral Phase - Comment --  CHL IP PHARYNGEAL PHASE 06/22/2017 Pharyngeal Phase Impaired Pharyngeal- Pudding Teaspoon -- Pharyngeal --  Pharyngeal- Pudding Cup -- Pharyngeal -- Pharyngeal- Honey Teaspoon NT Pharyngeal -- Pharyngeal- Honey Cup --  Pharyngeal -- Pharyngeal- Nectar Teaspoon Delayed swallow initiation-vallecula;Reduced anterior laryngeal mobility;Reduced laryngeal elevation;Reduced airway/laryngeal closure;Reduced tongue base retraction Pharyngeal Material does not enter airway Pharyngeal- Nectar Cup Delayed swallow initiation-vallecula;Reduced anterior laryngeal mobility;Reduced laryngeal elevation;Reduced airway/laryngeal closure;Reduced tongue base retraction;Penetration/Aspiration during swallow Pharyngeal Material enters airway, remains ABOVE vocal cords and not ejected out Pharyngeal- Nectar Straw Delayed swallow initiation-vallecula;Reduced anterior laryngeal mobility;Reduced laryngeal elevation;Reduced airway/laryngeal closure;Reduced tongue base retraction Pharyngeal -- Pharyngeal- Thin Teaspoon NT Pharyngeal -- Pharyngeal- Thin Cup Delayed swallow initiation-vallecula;Reduced anterior laryngeal mobility;Reduced laryngeal elevation;Reduced airway/laryngeal closure;Reduced tongue base retraction;Penetration/Aspiration during swallow Pharyngeal Material enters airway, remains ABOVE vocal cords and not ejected out;Material enters airway, CONTACTS cords and then ejected out Pharyngeal- Thin Straw -- Pharyngeal -- Pharyngeal- Puree Delayed swallow initiation-vallecula;Reduced anterior laryngeal mobility;Reduced laryngeal elevation;Reduced airway/laryngeal closure;Reduced tongue base retraction;Pharyngeal residue - valleculae Pharyngeal -- Pharyngeal- Mechanical Soft Delayed swallow initiation-vallecula;Reduced anterior laryngeal mobility;Reduced laryngeal elevation;Reduced airway/laryngeal closure;Reduced tongue base retraction Pharyngeal -- Pharyngeal- Regular -- Pharyngeal -- Pharyngeal- Multi-consistency -- Pharyngeal -- Pharyngeal- Pill -- Pharyngeal -- Pharyngeal Comment --  CHL IP CERVICAL ESOPHAGEAL PHASE 06/22/2017 Cervical Esophageal Phase WFL Pudding Teaspoon -- Pudding Cup -- Honey Teaspoon -- Honey Cup -- Nectar Teaspoon --  Nectar Cup -- Nectar Straw -- Thin Teaspoon -- Thin Cup -- Thin Straw -- Puree -- Mechanical Soft -- Regular -- Multi-consistency -- Pill -- Cervical Esophageal Comment -- No flowsheet data found. [name] 06/22/2017, 1:56 PM  [name], M.A. CCC-SLP [phone]                Subjective: Seen this morning. "I feel great". Denies complaints. No chest pain, dyspnea, cough, dizziness or lightheadedness reported. No swallowing difficulty reported. As per RN, no acute issues noted.  Discharge Exam:  Vitals:   06/25/17 2120 06/26/17 0431 06/26/17 0549 06/26/17 1033  BP: (!) 117/46  (!) 110/47   Pulse: 88  84   Resp: 20  18   Temp: 98 F (36.7 C)  98.3 F (36.8 C)   TempSrc: Oral  Oral   SpO2: 97%  99% 97%  Weight:  99.3 kg (219 lb)    Height:        General appearance: Elderly obese adult female, sitting up comfortably in chair. Does not appear in any distress. Continues to improve HEENT: Hoarseness improved and almost resolved.. Cardiac: S1 and S2 heard, RRR. No JVD or murmurs. Very trace bilateral ankle edema. Telemetry: Sinus rhythm. Occasional NSVT. Respiratory: Clear to auscultation. No increased work of breathing.  Abdomen: Nondistended, soft and nontender. Normal bowel sounds heard.  Neuro: Alert and oriented 3. No focal neurological deficits.  Psych: Pleasant and interactive. Answers questions appropriately.     The results of significant diagnostics from this hospitalization (including imaging, microbiology, ancillary and laboratory) are listed below for reference.     Microbiology: Recent Results (from the past 240 hour(s))  Culture, Urine     Status: None   Collection Time: 06/18/17 11:09 AM  Result Value Ref Range Status   Specimen Description URINE, CLEAN CATCH  Final   Special Requests NONE  Final   Culture NO GROWTH  Final   Report Status 06/19/2017 FINAL  Final     Labs: CBC: Recent Labs  Lab 06/20/17 0427 06/24/17 0406  WBC 11.3*  12.2*  NEUTROABS 8.6*  --   HGB 9.5* 10.6*  HCT 32.0* 35.7*  MCV 100.3* 99.4  PLT 109* 175  Basic Metabolic Panel: Recent Labs  Lab 06/22/17 0337 06/23/17 0422 06/24/17 0406 06/25/17 1205 06/26/17 0411  NA 142 139 140 139 142  K 3.8 4.1 4.2 3.9 3.8  CL 94* 91* 93* 92* 95*  CO2 40* 39* 39* 38* 39*  GLUCOSE 132* 122* 121* 111* 77  BUN 27* 25* 22* 22* 23*  CREATININE 0.67 0.68 0.81 0.92 0.94  CALCIUM 9.0 9.2 9.1 8.9 9.1   BNP (last 3 results) Recent Labs    05/26/17 0610  BNP 760.9*   CBG: Recent Labs  Lab 06/25/17 0805 06/25/17 1147 06/25/17 1654 06/25/17 2106 06/26/17 0902  GLUCAP 85 117* 165* 153* 92   Urinalysis    Component Value Date/Time   COLORURINE YELLOW 06/18/2017 1116   APPEARANCEUR CLEAR 06/18/2017 1116   LABSPEC 1.017 06/18/2017 1116   PHURINE 7.0 06/18/2017 1116   GLUCOSEU NEGATIVE 06/18/2017 1116   HGBUR NEGATIVE 06/18/2017 1116   [place] 06/18/2017 1116   BILIRUBINUR neg. 06/28/2013 [place] 06/18/2017 1116   PROTEINUR NEGATIVE 06/18/2017 1116   UROBILINOGEN 0.2 06/28/2013 1041   UROBILINOGEN 0.2 07/30/2009 1435   NITRITE NEGATIVE 06/18/2017 1116   LEUKOCYTESUR NEGATIVE 06/18/2017 1116      Time coordinating discharge: Over 30 minutes  SIGNED:  Vernell Leep, MD, FACP, [facility]. [facility] Pager [phone]  If 7PM-7AM, please contact night-coverage [email or web address] Password [email or web address] 06/26/2017, 12:10 PM

## 2017-06-26 NOTE — Progress Notes (Signed)
Patient will Discharge To: [facility] Anticipated DC Date:06/27/27 Family Notified:Yes  Transport By: Corey Harold   Per MD patient ready for DC to [facility]. RN, patient, patient's family, and facility notified of DC. Assessment, Fl2/Pasrr, and Discharge Summary sent to facility. RN given number for report [phone]). DC packet on chart. Ambulance transport requested for patient.   CSW signing off.  Reed Breech LCSWA [phone]

## 2017-06-27 DIAGNOSIS — I509 Heart failure, unspecified: Secondary | ICD-10-CM | POA: Diagnosis not present

## 2017-06-27 DIAGNOSIS — I2510 Atherosclerotic heart disease of native coronary artery without angina pectoris: Secondary | ICD-10-CM | POA: Diagnosis not present

## 2017-06-27 DIAGNOSIS — J9600 Acute respiratory failure, unspecified whether with hypoxia or hypercapnia: Secondary | ICD-10-CM | POA: Diagnosis not present

## 2017-06-28 NOTE — Consult Note (Signed)
           [facility] CM Primary Care Navigator  06/28/2017  Kelli Velasquez 1941-03-05 [id number]   Went back to see patient at the bedside since transfer out from ICU to identify possible discharge needs but patient was already discharged per staff report.  Patient was discharged to skilled nursing facility ([facility]- [facility]) over the weekend.  Patient has discharge follow-up instructions as follows: 1. Shirley Friar, PA-C/A, Cardiology with advanced heart failure clinic on 07/06/17 at 9 AM. 2. Dr. Christinia Gully, Pulmonology in 2 weeks: SNF (skilled nursing facility) to assist with follow-up appointment. 3. Dr. Jodi Marble, ENT in 2 weeks: SNF (skilled nursing facility) to assist with follow-up appointment. 4. Dr. Loura Pardon, PCP upon discharge from SNF (skilled nursing facility).   For questions, please contact:  Dannielle Huh, BSN, RN- [facility] Primary Care Navigator  Telephone: [phone] [place]

## 2017-06-29 DIAGNOSIS — L89312 Pressure ulcer of right buttock, stage 2: Secondary | ICD-10-CM | POA: Diagnosis not present

## 2017-07-01 LAB — FUNGUS CULTURE RESULT

## 2017-07-01 LAB — FUNGUS CULTURE WITH STAIN

## 2017-07-01 LAB — FUNGAL ORGANISM REFLEX

## 2017-07-06 ENCOUNTER — Encounter (HOSPITAL_COMMUNITY): Payer: Self-pay

## 2017-07-07 ENCOUNTER — Inpatient Hospital Stay (HOSPITAL_COMMUNITY)
Admission: EM | Admit: 2017-07-07 | Discharge: 2017-07-10 | DRG: 871 | Disposition: A | Discharge status: Home Health | Payer: Medicare Other | Attending: Internal Medicine | Admitting: Internal Medicine

## 2017-07-07 ENCOUNTER — Encounter (HOSPITAL_COMMUNITY): Payer: Self-pay | Admitting: Emergency Medicine

## 2017-07-07 ENCOUNTER — Emergency Department (HOSPITAL_COMMUNITY): Payer: Medicare Other

## 2017-07-07 DIAGNOSIS — Z91018 Allergy to other foods: Secondary | ICD-10-CM

## 2017-07-07 DIAGNOSIS — Z9842 Cataract extraction status, left eye: Secondary | ICD-10-CM

## 2017-07-07 DIAGNOSIS — E669 Obesity, unspecified: Secondary | ICD-10-CM | POA: Diagnosis present

## 2017-07-07 DIAGNOSIS — J8489 Other specified interstitial pulmonary diseases: Secondary | ICD-10-CM | POA: Diagnosis present

## 2017-07-07 DIAGNOSIS — Z8249 Family history of ischemic heart disease and other diseases of the circulatory system: Secondary | ICD-10-CM

## 2017-07-07 DIAGNOSIS — Z880 Allergy status to penicillin: Secondary | ICD-10-CM

## 2017-07-07 DIAGNOSIS — Y92122 Bedroom in nursing home as the place of occurrence of the external cause: Secondary | ICD-10-CM | POA: Diagnosis not present

## 2017-07-07 DIAGNOSIS — J9811 Atelectasis: Secondary | ICD-10-CM | POA: Diagnosis present

## 2017-07-07 DIAGNOSIS — Z9841 Cataract extraction status, right eye: Secondary | ICD-10-CM

## 2017-07-07 DIAGNOSIS — X58XXXA Exposure to other specified factors, initial encounter: Secondary | ICD-10-CM | POA: Diagnosis present

## 2017-07-07 DIAGNOSIS — I2510 Atherosclerotic heart disease of native coronary artery without angina pectoris: Secondary | ICD-10-CM | POA: Diagnosis not present

## 2017-07-07 DIAGNOSIS — I252 Old myocardial infarction: Secondary | ICD-10-CM | POA: Diagnosis not present

## 2017-07-07 DIAGNOSIS — Z7902 Long term (current) use of antithrombotics/antiplatelets: Secondary | ICD-10-CM

## 2017-07-07 DIAGNOSIS — Z6839 Body mass index (BMI) 39.0-39.9, adult: Secondary | ICD-10-CM | POA: Diagnosis not present

## 2017-07-07 DIAGNOSIS — T502X5A Adverse effect of carbonic-anhydrase inhibitors, benzothiadiazides and other diuretics, initial encounter: Secondary | ICD-10-CM | POA: Diagnosis present

## 2017-07-07 DIAGNOSIS — A419 Sepsis, unspecified organism: Principal | ICD-10-CM | POA: Diagnosis present

## 2017-07-07 DIAGNOSIS — E785 Hyperlipidemia, unspecified: Secondary | ICD-10-CM | POA: Diagnosis present

## 2017-07-07 DIAGNOSIS — R404 Transient alteration of awareness: Secondary | ICD-10-CM | POA: Diagnosis not present

## 2017-07-07 DIAGNOSIS — I951 Orthostatic hypotension: Secondary | ICD-10-CM | POA: Diagnosis present

## 2017-07-07 DIAGNOSIS — S31829A Unspecified open wound of left buttock, initial encounter: Secondary | ICD-10-CM | POA: Diagnosis present

## 2017-07-07 DIAGNOSIS — R8290 Unspecified abnormal findings in urine: Secondary | ICD-10-CM | POA: Diagnosis present

## 2017-07-07 DIAGNOSIS — Z9981 Dependence on supplemental oxygen: Secondary | ICD-10-CM

## 2017-07-07 DIAGNOSIS — Z972 Presence of dental prosthetic device (complete) (partial): Secondary | ICD-10-CM | POA: Diagnosis not present

## 2017-07-07 DIAGNOSIS — Z823 Family history of stroke: Secondary | ICD-10-CM

## 2017-07-07 DIAGNOSIS — K219 Gastro-esophageal reflux disease without esophagitis: Secondary | ICD-10-CM | POA: Diagnosis present

## 2017-07-07 DIAGNOSIS — J9621 Acute and chronic respiratory failure with hypoxia: Secondary | ICD-10-CM | POA: Diagnosis not present

## 2017-07-07 DIAGNOSIS — E860 Dehydration: Secondary | ICD-10-CM | POA: Diagnosis present

## 2017-07-07 DIAGNOSIS — N390 Urinary tract infection, site not specified: Secondary | ICD-10-CM | POA: Diagnosis present

## 2017-07-07 DIAGNOSIS — R55 Syncope and collapse: Secondary | ICD-10-CM | POA: Diagnosis present

## 2017-07-07 DIAGNOSIS — E119 Type 2 diabetes mellitus without complications: Secondary | ICD-10-CM | POA: Diagnosis not present

## 2017-07-07 DIAGNOSIS — Z87891 Personal history of nicotine dependence: Secondary | ICD-10-CM

## 2017-07-07 DIAGNOSIS — Z886 Allergy status to analgesic agent status: Secondary | ICD-10-CM

## 2017-07-07 DIAGNOSIS — Z96652 Presence of left artificial knee joint: Secondary | ICD-10-CM | POA: Diagnosis present

## 2017-07-07 DIAGNOSIS — Z7952 Long term (current) use of systemic steroids: Secondary | ICD-10-CM

## 2017-07-07 DIAGNOSIS — Z973 Presence of spectacles and contact lenses: Secondary | ICD-10-CM | POA: Diagnosis not present

## 2017-07-07 DIAGNOSIS — I959 Hypotension, unspecified: Secondary | ICD-10-CM | POA: Diagnosis not present

## 2017-07-07 DIAGNOSIS — R1310 Dysphagia, unspecified: Secondary | ICD-10-CM

## 2017-07-07 DIAGNOSIS — Z794 Long term (current) use of insulin: Secondary | ICD-10-CM

## 2017-07-07 DIAGNOSIS — S31809A Unspecified open wound of unspecified buttock, initial encounter: Secondary | ICD-10-CM | POA: Diagnosis present

## 2017-07-07 DIAGNOSIS — N179 Acute kidney failure, unspecified: Secondary | ICD-10-CM | POA: Diagnosis not present

## 2017-07-07 DIAGNOSIS — I255 Ischemic cardiomyopathy: Secondary | ICD-10-CM | POA: Diagnosis present

## 2017-07-07 DIAGNOSIS — N289 Disorder of kidney and ureter, unspecified: Secondary | ICD-10-CM

## 2017-07-07 DIAGNOSIS — E1169 Type 2 diabetes mellitus with other specified complication: Secondary | ICD-10-CM | POA: Diagnosis present

## 2017-07-07 DIAGNOSIS — I5022 Chronic systolic (congestive) heart failure: Secondary | ICD-10-CM | POA: Diagnosis present

## 2017-07-07 DIAGNOSIS — Z7901 Long term (current) use of anticoagulants: Secondary | ICD-10-CM

## 2017-07-07 DIAGNOSIS — Z833 Family history of diabetes mellitus: Secondary | ICD-10-CM

## 2017-07-07 DIAGNOSIS — Z961 Presence of intraocular lens: Secondary | ICD-10-CM | POA: Diagnosis present

## 2017-07-07 DIAGNOSIS — Z79899 Other long term (current) drug therapy: Secondary | ICD-10-CM

## 2017-07-07 DIAGNOSIS — R531 Weakness: Secondary | ICD-10-CM | POA: Diagnosis not present

## 2017-07-07 DIAGNOSIS — Z955 Presence of coronary angioplasty implant and graft: Secondary | ICD-10-CM

## 2017-07-07 DIAGNOSIS — R7303 Prediabetes: Secondary | ICD-10-CM | POA: Diagnosis present

## 2017-07-07 DIAGNOSIS — Z888 Allergy status to other drugs, medicaments and biological substances status: Secondary | ICD-10-CM

## 2017-07-07 DIAGNOSIS — G9341 Metabolic encephalopathy: Secondary | ICD-10-CM | POA: Diagnosis present

## 2017-07-07 DIAGNOSIS — Z803 Family history of malignant neoplasm of breast: Secondary | ICD-10-CM

## 2017-07-07 DIAGNOSIS — Z86718 Personal history of other venous thrombosis and embolism: Secondary | ICD-10-CM | POA: Diagnosis present

## 2017-07-07 DIAGNOSIS — Z9049 Acquired absence of other specified parts of digestive tract: Secondary | ICD-10-CM

## 2017-07-07 DIAGNOSIS — I110 Hypertensive heart disease with heart failure: Secondary | ICD-10-CM | POA: Diagnosis present

## 2017-07-07 LAB — BASIC METABOLIC PANEL
Anion gap: 8 (ref 5–15)
BUN: 23 mg/dL — ABNORMAL HIGH (ref 6–20)
CALCIUM: 8.8 mg/dL — AB (ref 8.9–10.3)
CO2: 35 mmol/L — AB (ref 22–32)
CREATININE: 1.34 mg/dL — AB (ref 0.44–1.00)
Chloride: 101 mmol/L (ref 101–111)
GFR calc non Af Amer: 37 mL/min — ABNORMAL LOW (ref >60)
GFR, EST AFRICAN AMERICAN: 43 mL/min — AB (ref >60)
GLUCOSE: 110 mg/dL — AB (ref 65–99)
Potassium: 3.9 mmol/L (ref 3.5–5.1)
Sodium: 144 mmol/L (ref 135–145)

## 2017-07-07 LAB — CBC
HCT: 39.2 % (ref 36.0–46.0)
Hemoglobin: 11.7 g/dL — ABNORMAL LOW (ref 12.0–15.0)
MCH: 30.5 pg (ref 26.0–34.0)
MCHC: 29.8 g/dL — AB (ref 30.0–36.0)
MCV: 102.1 fL — ABNORMAL HIGH (ref 78.0–100.0)
PLATELETS: 250 10*3/uL (ref 150–400)
RBC: 3.84 MIL/uL — ABNORMAL LOW (ref 3.87–5.11)
RDW: 19.5 % — AB (ref 11.5–15.5)
WBC: 16.9 10*3/uL — ABNORMAL HIGH (ref 4.0–10.5)

## 2017-07-07 LAB — URINALYSIS, ROUTINE W REFLEX MICROSCOPIC
Bilirubin Urine: NEGATIVE
Glucose, UA: NEGATIVE mg/dL
Hgb urine dipstick: NEGATIVE
Ketones, ur: NEGATIVE mg/dL
Nitrite: NEGATIVE
PH: 7 (ref 5.0–8.0)
Protein, ur: 100 mg/dL — AB
SPECIFIC GRAVITY, URINE: 1.008 (ref 1.005–1.030)

## 2017-07-07 LAB — CBG MONITORING, ED
GLUCOSE-CAPILLARY: 96 mg/dL (ref 65–99)
GLUCOSE-CAPILLARY: 115 mg/dL — AB (ref 65–99)
GLUCOSE-CAPILLARY: 52 mg/dL — AB (ref 65–99)
GLUCOSE-CAPILLARY: 62 mg/dL — AB (ref 65–99)
Glucose-Capillary: 86 mg/dL (ref 65–99)

## 2017-07-07 LAB — TROPONIN I

## 2017-07-07 LAB — I-STAT TROPONIN, ED: TROPONIN I, POC: 0.03 ng/mL (ref 0.00–0.08)

## 2017-07-07 LAB — GLUCOSE, CAPILLARY: GLUCOSE-CAPILLARY: 104 mg/dL — AB (ref 65–99)

## 2017-07-07 LAB — I-STAT CG4 LACTIC ACID, ED: LACTIC ACID, VENOUS: 1.98 mmol/L — AB (ref 0.5–1.9)

## 2017-07-07 LAB — MAGNESIUM: Magnesium: 2 mg/dL (ref 1.7–2.4)

## 2017-07-07 LAB — PHOSPHORUS: PHOSPHORUS: 4.4 mg/dL (ref 2.5–4.6)

## 2017-07-07 LAB — BRAIN NATRIURETIC PEPTIDE: B NATRIURETIC PEPTIDE 5: 242.8 pg/mL — AB (ref 0.0–100.0)

## 2017-07-07 MED ORDER — CLOPIDOGREL BISULFATE 75 MG PO TABS
75.0000 mg | ORAL_TABLET | Freq: Every day | ORAL | Status: DC
  Administered 2017-07-08 – 2017-07-10 (×3): 75 mg via ORAL
  Filled 2017-07-07 (×3): qty 1

## 2017-07-07 MED ORDER — SODIUM CHLORIDE 0.9 % IV BOLUS (SEPSIS)
1000.0000 mL | Freq: Once | INTRAVENOUS | Status: AC
  Administered 2017-07-07: 1000 mL via INTRAVENOUS

## 2017-07-07 MED ORDER — FAMOTIDINE 20 MG PO TABS
20.0000 mg | ORAL_TABLET | Freq: Every day | ORAL | Status: DC
  Administered 2017-07-07 – 2017-07-10 (×4): 20 mg via ORAL
  Filled 2017-07-07 (×4): qty 1

## 2017-07-07 MED ORDER — ALBUTEROL SULFATE (2.5 MG/3ML) 0.083% IN NEBU: 3.0000 mL | INHALATION_SOLUTION | RESPIRATORY_TRACT | Status: DC | PRN

## 2017-07-07 MED ORDER — EZETIMIBE 10 MG PO TABS
10.0000 mg | ORAL_TABLET | Freq: Every day | ORAL | Status: DC
  Administered 2017-07-07 – 2017-07-10 (×4): 10 mg via ORAL
  Filled 2017-07-07 (×4): qty 1

## 2017-07-07 MED ORDER — ACETAMINOPHEN 650 MG RE SUPP: 650.0000 mg | Freq: Four times a day (QID) | RECTAL | Status: DC | PRN

## 2017-07-07 MED ORDER — ACETAMINOPHEN 325 MG PO TABS: 650.0000 mg | ORAL_TABLET | Freq: Four times a day (QID) | ORAL | Status: DC | PRN

## 2017-07-07 MED ORDER — MONTELUKAST SODIUM 10 MG PO TABS
10.0000 mg | ORAL_TABLET | Freq: Every day | ORAL | Status: DC
  Administered 2017-07-07 – 2017-07-09 (×3): 10 mg via ORAL
  Filled 2017-07-07 (×3): qty 1

## 2017-07-07 MED ORDER — PRAVASTATIN SODIUM 40 MG PO TABS
40.0000 mg | ORAL_TABLET | Freq: Every day | ORAL | Status: DC
  Administered 2017-07-07 – 2017-07-09 (×3): 40 mg via ORAL
  Filled 2017-07-07 (×3): qty 1

## 2017-07-07 MED ORDER — INSULIN GLARGINE 100 UNIT/ML ~~LOC~~ SOLN
22.0000 [IU] | Freq: Every day | SUBCUTANEOUS | Status: DC
  Administered 2017-07-07: 22 [IU] via SUBCUTANEOUS
  Filled 2017-07-07: qty 0.22

## 2017-07-07 MED ORDER — INSULIN ASPART 100 UNIT/ML ~~LOC~~ SOLN: 0.0000 [IU] | Freq: Three times a day (TID) | SUBCUTANEOUS | Status: DC

## 2017-07-07 MED ORDER — DEXTROSE 5 % IV SOLN
1.0000 g | INTRAVENOUS | Status: DC
  Administered 2017-07-07 – 2017-07-09 (×3): 1 g via INTRAVENOUS
  Filled 2017-07-07 (×4): qty 10

## 2017-07-07 MED ORDER — SODIUM CHLORIDE 0.9 % IV SOLN
INTRAVENOUS | Status: DC
  Administered 2017-07-07: 100 mL/h via INTRAVENOUS

## 2017-07-07 MED ORDER — INSULIN ASPART 100 UNIT/ML ~~LOC~~ SOLN: 0.0000 [IU] | Freq: Every day | SUBCUTANEOUS | Status: DC

## 2017-07-07 MED ORDER — SODIUM CHLORIDE 0.9% FLUSH
3.0000 mL | Freq: Two times a day (BID) | INTRAVENOUS | Status: DC
  Administered 2017-07-07 – 2017-07-10 (×5): 3 mL via INTRAVENOUS

## 2017-07-07 MED ORDER — PANTOPRAZOLE SODIUM 40 MG PO TBEC
40.0000 mg | DELAYED_RELEASE_TABLET | Freq: Every day | ORAL | Status: DC
  Administered 2017-07-08 – 2017-07-10 (×3): 40 mg via ORAL
  Filled 2017-07-07 (×3): qty 1

## 2017-07-07 MED ORDER — APIXABAN 5 MG PO TABS
5.0000 mg | ORAL_TABLET | Freq: Two times a day (BID) | ORAL | Status: DC
  Administered 2017-07-07 – 2017-07-10 (×6): 5 mg via ORAL
  Filled 2017-07-07 (×6): qty 1

## 2017-07-07 MED ORDER — PREDNISONE 10 MG PO TABS
10.0000 mg | ORAL_TABLET | Freq: Every day | ORAL | Status: DC
  Administered 2017-07-08 – 2017-07-10 (×3): 10 mg via ORAL
  Filled 2017-07-07 (×3): qty 1

## 2017-07-07 MED ORDER — DEXTROSE-NACL 5-0.9 % IV SOLN
INTRAVENOUS | Status: DC
  Administered 2017-07-07: 23:00:00 via INTRAVENOUS

## 2017-07-07 MED ORDER — INSULIN ASPART 100 UNIT/ML ~~LOC~~ SOLN
0.0000 [IU] | SUBCUTANEOUS | Status: DC
  Administered 2017-07-08: 2 [IU] via SUBCUTANEOUS

## 2017-07-07 NOTE — H&P (Signed)
History and Physical    Kelli Velasquez QQI:[id number] DOB: 08/27/1940 DOA: 07/07/2017   PCP: Abner Greenspan, MD   Attending physician: Denton Brick  Patient coming from/Resides with: SNF for rehab  Chief Complaint: Altered mental status, near syncopal event  HPI: Kelli Velasquez is a 76 y.o. female with medical history significant for CAD, chronic systolic heart failure, dysphagia, recurrent UTI, and hypertension.  Patient was discharged to skilled nursing facility for rehabilitation therapy on 12/1 after a protracted hospitalization following acute respiratory failure secondary to ischemic CHF.  This required 2 separate episodes of intubation.  Second episode was second to MSSA pneumonia.  Patient has a history of BOOP as well.  Patient underwent cardiac catheterization during previous hospitalization and had a stent placed to her RCA.  Echo revealed EF of 35%.  He was slow to recover and had dysphagia but was stable in the D2 diet at time of discharge.  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  She also developed a right upper extremity DVT secondary to PICC line and was started on Eliquis during the hospitalization.  New medications initiated during previous admission were Aldactone and Entresto.  The patient was sent back to the ER today via EMS after she had a near syncopal episode while seated at the nursing facility.  She was actually meeting with appropriate staff in preparation for discharge back home after completing her rehab program.  Initial blood pressure was 50/30 with O2 sats 80% despite baseline 2 L oxygen.  A nonrebreathing mask was placed.  He was also given 500 cc of normal saline in the field.  Arrival to the ER she was mildly tachypneic with a blood pressure reading of 84/38.  Lab work was consistent with acute kidney injury.  She had a mildly elevated lactic acid of 1.98 as well as leukocytosis 16,900.  Her urinalysis was also  abnormal and concerning for UTI.  She remains lethargic but awakens and is somewhat oriented when awake.  He has received an additional 1 L of saline the ER and systolic blood pressures have remained above 100 although occasionally dip down into the 80 range.  She will be admitted for near syncope presumed related to volume depletion/orthostasis.  Because of abnormal urinalysis early sepsis physiology/UTI could also be part of the etiology.  ED Course:  Vital Signs: BP 105/61   Pulse 79   Temp 98.8 F (37.1 C) (Rectal)   Resp (!) 24   Ht 5\' 4"  (1.626 m)   Wt 104.3 kg (230 lb)   LMP 07/27/1980   SpO2 96%   BMI 39.48 kg/m  2 view CXR: Clear low lung volumes with bibasilar atelectasis without any acute cardiopulmonary abnormality Lab data: Sodium 144, potassium 3.9, chloride 101, CO2 35, glucose 110, BUN 23, creatinine 1.34, BNP 243, poc troponin 0 0.03, lactic acid 1.98, white count 16,900 differential not obtained, hemoglobin 11.7, platelets 250,000, urinalysis abnormal: Hazy appearance, small leukocytes, 100 protein, few bacteria, 6-30 WBCs Medications and treatments: Normal saline x 1 L  Review of Systems:  In addition to the HPI above,  No Fever-chills, myalgias or other constitutional symptoms No Headache, changes with Vision or hearing, new weakness, tingling, numbness in any extremity,  dysarthria or word finding difficulty, tremors or seizure activity No problems swallowing food or Liquids, indigestion/reflux, choking or coughing while eating, abdominal pain with or after eating No Chest pain, Cough or Shortness of Breath, palpitations, orthopnea or DOE  No Abdominal pain, N/V, melena,hematochezia, dark tarry stools, constipation No dysuria, malodorous urine, hematuria or flank pain No new skin rashes, lesions, masses or bruises, No new joint pains, aches, swelling or redness No recent unintentional weight gain or loss No polyuria, polydypsia or polyphagia   Past Medical History:   Diagnosis Date  . Allergy    allergic rhinitis  . Asthma   . Chronic bronchitis ([place])   . Coronary artery disease    cath January 2011 with DEs LAD and RCA  . Dyspnea   . Full dentures   . Gallstones   . GERD (gastroesophageal reflux disease)   . History of echocardiogram    Echo 5/17: mod LVH, EF 50-55%, ant-septal HK, Gr 1 DD, mod LAE //  b.  Echo 7/17: mild concentric LVH, EF 45-50%, inf-lat, inf, inf-septal HK, mild LAE  . History of nuclear stress test    Myoview 7/17: EF 48%, small mild apical defect, no ischemia, low risk  . Hyperlipidemia   . Hypertension   . 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 Myopia   . 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 Nocturnal oxygen desaturation    o2 at night  . Obesity   . Osteoarthritis    knees, fingers, shoulders  . Overactive bladder   . Oxygen deficiency    uses oxygen all day  . Rash    and other non specific skin eruptions  . Retaining fluid    in ankles and feet  . Urinary incontinence   . Vertigo   . Wears glasses     Past Surgical History:  Procedure Laterality Date  . CATARACT EXTRACTION W/PHACO Right 12/16/2016   Procedure: CATARACT EXTRACTION PHACO AND INTRAOCULAR LENS PLACEMENT ([place])  right;  Surgeon: Leandrew Koyanagi, MD;  Location: [facility];  Service: Ophthalmology;  Laterality: Right;  . CATARACT EXTRACTION W/PHACO Left 02/03/2017   Procedure: CATARACT EXTRACTION PHACO AND INTRAOCULAR LENS PLACEMENT ([place])  Left  Complicated;  Surgeon: Leandrew Koyanagi, MD;  Location: [facility];  Service: Ophthalmology;  Laterality: Left;  [place] Uses oxygen   . CHOLECYSTECTOMY  92  . COLONOSCOPY    . CORONARY ANGIOGRAPHY N/A 06/16/2017   Procedure: CORONARY ANGIOGRAPHY;  Surgeon: Larey Dresser, MD;  Location: [facility];  Service: Cardiovascular;  Laterality: N/A;  . CORONARY ANGIOPLASTY WITH STENT PLACEMENT   07/2009   stent  . CORONARY STENT INTERVENTION N/A 06/16/2017   Procedure: CORONARY STENT INTERVENTION;  Surgeon: Wellington Hampshire, MD;  Location: [facility];  Service: Cardiovascular;  Laterality: N/A;  . DILATION AND CURETTAGE OF UTERUS  1984  . INCISIONAL HERNIA REPAIR N/A 05/16/2013   Procedure: HERNIA REPAIR INFRAUMILICAL INCISIONAL;  Surgeon: Joyice Faster. Cornett, MD;  Location: [facility];  Service: General;  Laterality: N/A;  umbilical  . INSERTION OF MESH N/A 05/16/2013   Procedure: INSERTION OF MESH;  Surgeon: Joyice Faster. Cornett, MD;  Location: [facility];  Service: General;  Laterality: N/A;  umbilical  . JOINT REPLACEMENT  2007   right total knee replacement  . RIGHT/LEFT HEART CATH AND CORONARY ANGIOGRAPHY N/A 06/16/2017   Procedure: RIGHT/LEFT HEART CATH AND CORONARY ANGIOGRAPHY;  Surgeon: Larey Dresser, MD;  Location: [facility];  Service: Cardiovascular;  Laterality: N/A;  . TOTAL KNEE ARTHROPLASTY  2010   left , then vocal cord infection post op  .  VIDEO BRONCHOSCOPY Bilateral 07/05/2013   Procedure: VIDEO BRONCHOSCOPY WITH FLUORO;  Surgeon: Tanda Rockers, MD;  Location: [facility];  Service: Cardiopulmonary;  Laterality: Bilateral;  . vocal cord polypectomy      Social History   Socioeconomic History  . Marital status: Married    Spouse name: Not on file  . Number of children: Not on file  . Years of education: Not on file  . Highest education level: Not on file  Social Needs  . Financial resource strain: Not on file  . Food insecurity - worry: Not on file  . Food insecurity - inability: Not on file  . Transportation needs - medical: Not on file  . Transportation needs - non-medical: Not on file  Occupational History  . Not on file  Tobacco Use  . Smoking status: Former Smoker    Packs/day: 1.00    Years: 25.00    Pack years: 25.00    Types: Cigarettes    Last attempt to quit: 07/27/1982    Years since  quitting: 34.9  . Smokeless tobacco: Never Used  Substance and Sexual Activity  . Alcohol use: Yes    Alcohol/week: 0.0 oz    Comment: wine-rare  . Drug use: No  . Sexual activity: Not on file  Other Topics Concern  . Not on file  Social History Narrative  . Not on file    Mobility: Rolling walker Work history: Not obtained   Allergies  Allergen Reactions  . Fish Allergy Anaphylaxis  . Shellfish Allergy Anaphylaxis  . Aspirin     REACTION: nausea and vomiting High doses  . Atorvastatin     REACTION: leg pain  . Crestor [Rosuvastatin Calcium] Other (See Comments)    Muscle pain - allergy/intolerance  . Penicillins     Due to mold allergy per pt  . Simvastatin     REACTION: muscle pain  . Trandolapril     REACTION: leg pain    Family History  Problem Relation Age of Onset  . Stroke Mother   . Diabetes Mother 76  . Stroke Father   . Heart failure Father   . Breast cancer Maternal Grandmother   . Colon cancer Neg Hx     Prior to Admission medications   Medication Sig Start Date End Date Taking? Authorizing Provider  apixaban (ELIQUIS) 5 MG TABS tablet Take 1 tablet (5 mg total) by mouth 2 (two) times daily. 06/26/17  Yes Hongalgi, Lenis Dickinson, MD  clopidogrel (PLAVIX) 75 MG tablet Take 1 tablet (75 mg total) by mouth daily. 06/27/17  Yes Hongalgi, Lenis Dickinson, MD  cyanocobalamin (,VITAMIN B-12,) 1000 MCG/ML injection Inject 1,000 mcg into the muscle every 3 (three) months.   Yes [provider]  esomeprazole (NEXIUM) 40 MG capsule Take 1 capsule (40 mg total) by mouth daily. 02/02/17  Yes Tower, Wynelle Fanny, MD  ezetimibe (ZETIA) 10 MG tablet TAKE 1 TABLET (10 MG TOTAL) BY MOUTH DAILY. 02/02/17  Yes Tower, Wynelle Fanny, MD  furosemide (LASIX) 40 MG tablet Take 1 tablet (40 mg total) by mouth daily. 06/26/17  Yes Hongalgi, Lenis Dickinson, MD  insulin aspart (NOVOLOG) 100 UNIT/ML injection Inject 0-9 Units into the skin 3 (three) times daily with meals. CBG < 70: implement hypoglycemia  protocol CBG 70 - 120: 0 units CBG 121 - 150: 1 unit CBG 151 - 200: 2 units CBG 201 - 250: 3 units CBG 251 - 300: 5 units CBG 301 - 350: 7 units CBG 351 -  400: 9 units CBG > 400: call MD. 06/26/17  Yes Hongalgi, Lenis Dickinson, MD  insulin glargine (LANTUS) 100 UNIT/ML injection Inject 0.22 mLs (22 Units total) into the skin daily. 06/27/17  Yes Hongalgi, Lenis Dickinson, MD  mineral oil liquid Place in ear(s) once a week. 1 drop of mineral oil each ear once weekly to soften ear wax. 07/01/17  Yes Hongalgi, Lenis Dickinson, MD  montelukast (SINGULAIR) 10 MG tablet Take 1 tablet (10 mg total) by mouth at bedtime. 02/02/17  Yes Tower, Wynelle Fanny, MD  Multiple Vitamin (MULTIVITAMIN) capsule Take 1 capsule by mouth daily.   Yes [provider]  OXYGEN Inhale 3 L into the lungs continuous. 24/7 2 lpm  [facility]   Yes [provider]  pravastatin (PRAVACHOL) 40 MG tablet Take 1 tablet (40 mg total) by mouth daily at 6 PM. 06/26/17  Yes Hongalgi, Lenis Dickinson, MD  predniSONE (DELTASONE) 10 MG tablet Take 1 tablet (10 mg total) by mouth daily with breakfast. 06/26/17  Yes Hongalgi, Lenis Dickinson, MD  PROAIR HFA 108 (90 BASE) MCG/ACT inhaler INHALE 2 PUFFS BY MOUTH EVERY 4 HOURS AS NEEDED FOR WHEEZING OR FOR SHORTNESS OF BREATH 02/08/15  Yes Tower, Marne A, MD  ranitidine (ZANTAC) 75 MG tablet Take 300 mg by mouth at bedtime.    Yes [provider]  sacubitril-valsartan (ENTRESTO) 49-51 MG Take 1 tablet by mouth 2 (two) times daily. 06/26/17  Yes Hongalgi, Lenis Dickinson, MD  spironolactone (ALDACTONE) 25 MG tablet Take 1 tablet (25 mg total) by mouth daily. 06/27/17  Yes Hongalgi, Lenis Dickinson, MD  acetaminophen (TYLENOL) 325 MG tablet Take 650 mg by mouth every 6 (six) hours as needed for mild pain. Take as needed per bottle     [provider]  EPIPEN 2-PAK 0.3 MG/0.3ML SOAJ injection INJECT 0.3 MLS (0.3 MG TOTAL) INTO THE MUSCLE ONCE AS NEEDED FOR ALLERGIC REACTION 02/07/15   Tower, Wynelle Fanny, MD  Hydrocortisone (GERHARDT'S  BUTT CREAM) CREA Apply 1 application topically 4 (four) times daily as needed for irritation. 06/26/17   Hongalgi, Lenis Dickinson, MD  Maltodextrin-Xanthan Gum ([place]) POWD Oral, As needed, other 06/26/17   Modena Jansky, MD    Physical Exam: Vitals:   07/07/17 1600 07/07/17 1615 07/07/17 1630 07/07/17 1645  BP: (!) 102/46 (!) 86/53 (!) 104/52 105/61  Pulse: 71 78 71 79  Resp: 20 19 (!) 23 (!) 24  Temp:      TempSrc:      SpO2: 99% 97% 96% 96%  Weight:      Height:          Constitutional: NAD, calm, comfortable Eyes: PERRL, lids and conjunctivae normal ENMT: Mucous membranes are dry. Posterior pharynx clear of any exudate or lesions.Normal dentition.  Neck: normal, supple, no masses, no thyromegaly Respiratory: clear to auscultation bilaterally, no wheezing, no crackles.  Decreased respiratory effort lungs sounds quite diminished in the bilateral bases. No accessory muscle use.  Cardiovascular: Regular rate and rhythm, no murmurs / rubs / gallops. No extremity edema. 2+ pedal pulses. No carotid bruits.  2 L Abdomen: no tenderness, no masses palpated. No hepatosplenomegaly. Bowel sounds positive.  Musculoskeletal: no clubbing / cyanosis. No joint deformity upper and lower extremities. Good ROM, no contractures. Normal muscle tone.  Skin: no rashes, lesions, No induration-has a 4 cm x 4 cm right medial lower extremity contusion/bruise; there is a tiny blister with redness in the gluteal cleft Neurologic: CN 2-12 grossly intact just on  visual inspection. Sensation intact, DTR normal. Strength 5/5 x all 4 extremities.  Psychiatric: Awakens but remains sleepy.  When awake appears to be oriented to at least to name and place.   Labs on Admission: I have personally reviewed following labs and imaging studies  CBC: Recent Labs  Lab 07/07/17 1313  WBC 16.9*  HGB 11.7*  HCT 39.2  MCV 102.1*  PLT 416   Basic Metabolic Panel: Recent Labs  Lab 07/07/17 1313  NA 144    K 3.9  CL 101  CO2 35*  GLUCOSE 110*  BUN 23*  CREATININE 1.34*  CALCIUM 8.8*   GFR: Estimated Creatinine Clearance: 41.4 mL/min (A) (by C-G formula based on SCr of 1.34 mg/dL (H)). Liver Function Tests: No results for input(s): AST, ALT, ALKPHOS, BILITOT, PROT, ALBUMIN in the last 168 hours. No results for input(s): LIPASE, AMYLASE in the last 168 hours. No results for input(s): AMMONIA in the last 168 hours. Coagulation Profile: No results for input(s): INR, PROTIME in the last 168 hours. Cardiac Enzymes: No results for input(s): CKTOTAL, CKMB, CKMBINDEX, TROPONINI in the last 168 hours. BNP (last 3 results) Recent Labs    01/11/17 1053  PROBNP 264.0*   HbA1C: No results for input(s): HGBA1C in the last 72 hours. CBG: Recent Labs  Lab 07/07/17 1335  GLUCAP 96   Lipid Profile: No results for input(s): CHOL, HDL, LDLCALC, TRIG, CHOLHDL, LDLDIRECT in the last 72 hours. Thyroid Function Tests: No results for input(s): TSH, T4TOTAL, FREET4, T3FREE, THYROIDAB in the last 72 hours. Anemia Panel: No results for input(s): VITAMINB12, FOLATE, FERRITIN, TIBC, IRON, RETICCTPCT in the last 72 hours. Urine analysis:    Component Value Date/Time   COLORURINE YELLOW 07/07/2017 1501   APPEARANCEUR HAZY (A) 07/07/2017 1501   LABSPEC 1.008 07/07/2017 1501   PHURINE 7.0 07/07/2017 1501   GLUCOSEU NEGATIVE 07/07/2017 1501   HGBUR NEGATIVE 07/07/2017 1501   BILIRUBINUR NEGATIVE 07/07/2017 1501   BILIRUBINUR neg. 06/28/2013 1041   KETONESUR NEGATIVE 07/07/2017 1501   PROTEINUR 100 (A) 07/07/2017 1501   UROBILINOGEN 0.2 06/28/2013 1041   UROBILINOGEN 0.2 07/30/2009 1435   NITRITE NEGATIVE 07/07/2017 1501   LEUKOCYTESUR SMALL (A) 07/07/2017 1501   Sepsis Labs: @LABRCNTIP (procalcitonin:4,lacticidven:4) )No results found for this or any previous visit (from the past 240 hour(s)).   Radiological Exams on Admission: Dg Chest 2 View  Result Date: 07/07/2017 CLINICAL DATA:   76 year old female found lethargic. Hypotensive on presentation. EXAM: CHEST  2 VIEW COMPARISON:  06/15/2017 and earlier. FINDINGS: Upright AP and lateral views of the chest. Chronic very low lung volumes superimposed on cardiomegaly and mediastinal lipomatosis. No superimposed pneumothorax or pleural effusion. Confluent bibasilar opacity most resembles atelectasis. Stable pulmonary vascularity without overt edema. No acute osseous abnormality identified. Paucity of bowel gas in the upper abdomen. IMPRESSION: Chronically low lung volumes with bibasilar atelectasis. No acute cardiopulmonary abnormality. Electronically Signed   By: Genevie Ann M.D.   On: 07/07/2017 15:35    EKG: (Independently reviewed) sinus rhythm with ventricular rate 79 bpm, QTC 453 ms, normal R wave rotation, no acute ischemic changes, unchanged from previous EKG  Assessment/Plan Principal Problem:   Near syncope -Patient presents with altered mentation as well as suboptimal blood pressure readings that appear to have an orthostatic component without any focal neurological deficits -Suspect orthostatic hypotension in the context of treatment with Lasix, Aldactone and Entresto -He has an associated acute kidney injury -Of note peak weight 05/19/17 was 266 pounds with weight on 11/29  226 pounds and current weight 219 pounds -Obtain orthostatic vital signs although may be inaccurate since she has been given fluids -Hold offending medications -Continue IV fluid at 100/hr -Mobilize with assistance  Active Problems:   Acute kidney injury  -Suspect secondary to Aldactone, Lasix and Entresto therefore will hold these medications -IV fluid hydration -Follow labs -Baseline renal function: 22 and 0.94 -Current renal function: 23 and 1.34    Acute on chronic respiratory failure with hypoxia/history of BOOP -Chronically on 2 L oxygen with a degree of chronic hypercarbia noted on bmet as well as chronic hypoventilatory state -Suspect  acute decrease in oxygen relative to hypoventilation since chest x-ray without any obvious abnormalities and patient not volume overloaded and actually appears dry -Continue prednisone 10 mg daily-follow-up with Dr. Melvyn Novas as an outpatient    Acute metabolic encephalopathy -Secondary to dehydration and mild azotemia although the UTI also in the differential (see below)    Coronary artery disease -History of remote stents to LAD/RCA with recent stent to RCA during previous hospitalization -Continue Plavix, Zetia and statin -Not on beta-blocker prior to admission    Hypertension -Blood pressure suboptimal -Adding medications as above    Chronic systolic heart failure  -Currently compensated and actually appears over diuresed -Hold Lasix, Aldactone and Entresto -Not on beta-blocker prior to admission -Echocardiogram completed previous admission:(05/26/17): EF 34-40% with diffuse hypokinesis, grade 1 diastolic dysfunction and elevated LV filling pressure -Daily weights, strict I/O    Diabetes mellitus type 2 in obese  -New diagnosis previous admission -Continue Lantus and SSI    Abnormal urinalysis -Check urine culture -No indication at this juncture for empiric antibiotics    Dysphagia -NPO except for meds until more alert -Discharge was on D2 diet with nectar thick liquids    Acute deep vein thrombosis (DVT) of right upper extremity  -Continue Eliquis    Gluteal cleft wound -Air mattress -WOC RN evaluation      DVT prophylaxis: Eliquis Code Status: DNR but family and patient okay with short-term pressors if needed Family Communication: Husband Disposition Plan: SNF versus home Consults called: None    Moriah Loughry L. ANP-BC [facility] Pager [phone]   If 7PM-7AM, please contact night-coverage [email or web address] Password [email or web address]  07/07/2017, 5:18 PM

## 2017-07-07 NOTE — ED Provider Notes (Signed)
[facility] EMERGENCY DEPARTMENT Provider Note   CSN: [id number] Arrival date & time: 07/07/17  1250     History   Chief Complaint Chief Complaint  Patient presents with  . Weakness    HPI Kelli Velasquez is a 76 y.o. female.  HPI Kelli Velasquez is a 76 y.o. female with multiple medical problems including asthma, coronary disease, hypertension, hyperlipidemia, obesity, CHF, recent admission for respiratory failure with intubations x2, presents to emergency department with near syncopal episode.  Patient apparently was doing well this morning, was able to get ready, get dressed, eat breakfast on her own, was sitting in a chair with her husband and a Education officer, museum at her facility when she suddenly began feeling weak.  Patient "slumped over" and was unable to respond.  Patient states that she was never lost consciousness and remembers people asking her if she was okay.  Patient stated that she felt really dizzy and needed to lay down.  Patient became diaphoretic.  She denies any chest pain or shortness of breath during this episode.  Denies any palpitations.  When they checked her vital signs her blood pressure was low, 60 systolic.  Patient denies any changes in medications recently.  She is on Lasix for fluid overload.  She states she is feeling better at this time but still feels weak.  She denies any other complaints.  Past Medical History:  Diagnosis Date  . Allergy    allergic rhinitis  . Asthma   . Chronic bronchitis ([place])   . Coronary artery disease    cath January 2011 with DEs LAD and RCA  . Dyspnea   . Full dentures   . Gallstones   . GERD (gastroesophageal reflux disease)   . History of echocardiogram    Echo 5/17: mod LVH, EF 50-55%, ant-septal HK, Gr 1 DD, mod LAE //  b.  Echo 7/17: mild concentric LVH, EF 45-50%, inf-lat, inf, inf-septal HK, mild LAE  . History of nuclear stress test    Myoview 7/17: EF 48%, small mild apical defect, no ischemia, low  risk  . Hyperlipidemia   . Hypertension   . 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 Myopia   . 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 Nocturnal oxygen desaturation    o2 at night  . Obesity   . Osteoarthritis    knees, fingers, shoulders  . Overactive bladder   . Oxygen deficiency    uses oxygen all day  . Rash    and other non specific skin eruptions  . Retaining fluid    in ankles and feet  . Urinary incontinence   . Vertigo   . Wears glasses     Patient Active Problem List   Diagnosis Date Noted  . Palliative care by specialist   . 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 Hypoxemia   . Palliative care encounter   . Unstable angina ([place])   . Acute respiratory failure with hypercapnia ([place])   . COPD exacerbation ([place])   . Acute on chronic systolic congestive heart failure ([place])   . Encounter for screening mammogram for breast cancer 02/02/2017  . Chronic combined systolic and diastolic CHF, NYHA class 2 ([place]) 02/02/2017  . Depressed mood 12/02/2016  . Routine general medical examination at a health care facility 12/30/2015  . Supraumbilical hernia 53/29/9242  . Lump in the  abdomen 04/10/2015  . GERD (gastroesophageal reflux disease) 10/10/2014  . B12 deficiency 10/09/2014  . Skin lesions 10/09/2014  . Cataracts, bilateral 10/09/2014  . Dry eyes 10/09/2014  . Fatigue 05/08/2014  . Pedal edema 05/08/2014  . Shoulder tendonitis 01/24/2014  . Osteopenia 11/28/2013  . Estrogen deficiency 11/02/2013  . Dyspnea on exertion 06/27/2013  . Hernia, incisional 04/03/2013  . 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 Cough 12/18/2012  . Chronic respiratory failure with hypoxia and hypercapnia ([place]) 10/26/2012  . Special screening for malignant neoplasms, colon 06/16/2011  . DYSPHONIA 10/10/2010  . HYPERGLYCEMIA 05/27/2010  . CAD, NATIVE VESSEL 08/19/2009  .  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 Severe obesity (BMI >= 40) ([place]) 09/01/2007  . MYOPIA 03/03/2007  . Hyperlipidemia 10/15/2006  . Essential hypertension 10/15/2006  . Allergic rhinitis 10/15/2006  . Cough variant asthma vs UACS  10/15/2006  . OVERACTIVE BLADDER 10/15/2006  . OSTEOARTHRITIS 10/15/2006  . Urinary incontinence 10/15/2006    Past Surgical History:  Procedure Laterality Date  . CATARACT EXTRACTION W/PHACO Right 12/16/2016   Procedure: CATARACT EXTRACTION PHACO AND INTRAOCULAR LENS PLACEMENT ([place])  right;  Surgeon: Leandrew Koyanagi, MD;  Location: [facility];  Service: Ophthalmology;  Laterality: Right;  . CATARACT EXTRACTION W/PHACO Left 02/03/2017   Procedure: CATARACT EXTRACTION PHACO AND INTRAOCULAR LENS PLACEMENT ([place])  Left  Complicated;  Surgeon: Leandrew Koyanagi, MD;  Location: [facility];  Service: Ophthalmology;  Laterality: Left;  [place] Uses oxygen   . CHOLECYSTECTOMY  92  . COLONOSCOPY    . CORONARY ANGIOGRAPHY N/A 06/16/2017   Procedure: CORONARY ANGIOGRAPHY;  Surgeon: Larey Dresser, MD;  Location: [facility];  Service: Cardiovascular;  Laterality: N/A;  . CORONARY ANGIOPLASTY WITH STENT PLACEMENT  07/2009   stent  . CORONARY STENT INTERVENTION N/A 06/16/2017   Procedure: CORONARY STENT INTERVENTION;  Surgeon: Wellington Hampshire, MD;  Location: [facility];  Service: Cardiovascular;  Laterality: N/A;  . DILATION AND CURETTAGE OF UTERUS  1984  . INCISIONAL HERNIA REPAIR N/A 05/16/2013   Procedure: HERNIA REPAIR INFRAUMILICAL INCISIONAL;  Surgeon: Joyice Faster. Cornett, MD;  Location: [facility];  Service: General;  Laterality: N/A;  umbilical  . INSERTION OF MESH N/A 05/16/2013   Procedure: INSERTION OF MESH;  Surgeon: Joyice Faster. Cornett, MD;  Location: [facility];  Service: General;  Laterality: N/A;  umbilical  . JOINT REPLACEMENT  2007   right total knee  replacement  . RIGHT/LEFT HEART CATH AND CORONARY ANGIOGRAPHY N/A 06/16/2017   Procedure: RIGHT/LEFT HEART CATH AND CORONARY ANGIOGRAPHY;  Surgeon: Larey Dresser, MD;  Location: [facility];  Service: Cardiovascular;  Laterality: N/A;  . TOTAL KNEE ARTHROPLASTY  2010   left , then vocal cord infection post op  . VIDEO BRONCHOSCOPY Bilateral 07/05/2013   Procedure: VIDEO BRONCHOSCOPY WITH FLUORO;  Surgeon: Tanda Rockers, MD;  Location: [facility];  Service: Cardiopulmonary;  Laterality: Bilateral;  . vocal cord polypectomy      OB History    No data available       Home Medications    Prior to Admission medications   Medication Sig Start Date End Date Taking? Authorizing Provider  acetaminophen (TYLENOL) 325 MG tablet Take 650 mg by mouth every 6 (six) hours as needed for mild pain. Take as needed per bottle     [provider]  apixaban (ELIQUIS) 5 MG TABS tablet Take 1 tablet (5 mg total)  by mouth 2 (two) times daily. 06/26/17   Hongalgi, Lenis Dickinson, MD  clopidogrel (PLAVIX) 75 MG tablet Take 1 tablet (75 mg total) by mouth daily. 06/27/17   Hongalgi, Lenis Dickinson, MD  cyanocobalamin (,VITAMIN B-12,) 1000 MCG/ML injection Inject 1,000 mcg into the muscle every 3 (three) months.    [provider]  EPIPEN 2-PAK 0.3 MG/0.3ML SOAJ injection INJECT 0.3 MLS (0.3 MG TOTAL) INTO THE MUSCLE ONCE AS NEEDED FOR ALLERGIC REACTION 02/07/15   Tower, Wynelle Fanny, MD  esomeprazole (NEXIUM) 40 MG capsule Take 1 capsule (40 mg total) by mouth daily. 02/02/17   Tower, Wynelle Fanny, MD  ezetimibe (ZETIA) 10 MG tablet TAKE 1 TABLET (10 MG TOTAL) BY MOUTH DAILY. 02/02/17   Tower, Wynelle Fanny, MD  furosemide (LASIX) 40 MG tablet Take 1 tablet (40 mg total) by mouth daily. 06/26/17   Hongalgi, Lenis Dickinson, MD  Hydrocortisone (GERHARDT'S BUTT CREAM) CREA Apply 1 application topically 4 (four) times daily as needed for irritation. 06/26/17   Hongalgi, Lenis Dickinson, MD  insulin aspart (NOVOLOG) 100 UNIT/ML injection  Inject 0-9 Units into the skin 3 (three) times daily with meals. CBG < 70: implement hypoglycemia protocol CBG 70 - 120: 0 units CBG 121 - 150: 1 unit CBG 151 - 200: 2 units CBG 201 - 250: 3 units CBG 251 - 300: 5 units CBG 301 - 350: 7 units CBG 351 - 400: 9 units CBG > 400: call MD. 06/26/17   Modena Jansky, MD  insulin glargine (LANTUS) 100 UNIT/ML injection Inject 0.22 mLs (22 Units total) into the skin daily. 06/27/17   Hongalgi, Lenis Dickinson, MD  Maltodextrin-Xanthan Gum ([place]) POWD Oral, As needed, other 06/26/17   Modena Jansky, MD  mineral oil liquid Place in ear(s) once a week. 1 drop of mineral oil each ear once weekly to soften ear wax. 07/01/17   Hongalgi, Lenis Dickinson, MD  montelukast (SINGULAIR) 10 MG tablet Take 1 tablet (10 mg total) by mouth at bedtime. 02/02/17   Tower, Wynelle Fanny, MD  Multiple Vitamin (MULTIVITAMIN) capsule Take 1 capsule by mouth daily.    [provider]  OXYGEN Inhale 3 L into the lungs continuous. 24/7 2 lpm  [employer]    [provider]  pravastatin (PRAVACHOL) 40 MG tablet Take 1 tablet (40 mg total) by mouth daily at 6 PM. 06/26/17   Hongalgi, Lenis Dickinson, MD  predniSONE (DELTASONE) 10 MG tablet Take 1 tablet (10 mg total) by mouth daily with breakfast. 06/26/17   Hongalgi, Lenis Dickinson, MD  PROAIR HFA 108 (90 BASE) MCG/ACT inhaler INHALE 2 PUFFS BY MOUTH EVERY 4 HOURS AS NEEDED FOR WHEEZING OR FOR SHORTNESS OF BREATH 02/08/15   Tower, Wynelle Fanny, MD  ranitidine (ZANTAC) 75 MG tablet Take 300 mg by mouth at bedtime.     [provider]  sacubitril-valsartan (ENTRESTO) 49-51 MG Take 1 tablet by mouth 2 (two) times daily. 06/26/17   Hongalgi, Lenis Dickinson, MD  spironolactone (ALDACTONE) 25 MG tablet Take 1 tablet (25 mg total) by mouth daily. 06/27/17   Hongalgi, Lenis Dickinson, MD    Family History Family History  Problem Relation Age of Onset  . Stroke Mother   . Diabetes Mother 23  . Stroke Father   . Heart failure Father   . Breast  cancer Maternal Grandmother   . Colon cancer Neg Hx     Social History Social History   Tobacco Use  . Smoking status: Former Smoker  Packs/day: 1.00    Years: 25.00    Pack years: 25.00    Types: Cigarettes    Last attempt to quit: 07/27/1982    Years since quitting: 34.9  . Smokeless tobacco: Never Used  Substance Use Topics  . Alcohol use: Yes    Alcohol/week: 0.0 oz    Comment: wine-rare  . Drug use: No     Allergies   Fish allergy; Shellfish allergy; Aspirin; Atorvastatin; Crestor [rosuvastatin calcium]; Penicillins; Simvastatin; and Trandolapril   Review of Systems Review of Systems  Constitutional: Positive for diaphoresis and fatigue. Negative for chills and fever.  Respiratory: Negative for cough, chest tightness and shortness of breath.   Cardiovascular: Negative for chest pain, palpitations and leg swelling.  Gastrointestinal: Negative for abdominal pain, diarrhea, nausea and vomiting.  Genitourinary: Negative for dysuria, flank pain and pelvic pain.  Musculoskeletal: Negative for arthralgias, myalgias, neck pain and neck stiffness.  Skin: Negative for rash.  Neurological: Positive for dizziness, weakness and light-headedness. Negative for headaches.  All other systems reviewed and are negative.    Physical Exam Updated Vital Signs BP (!) 86/50   Pulse 76   Temp 97.7 F (36.5 C) (Oral)   Resp 20   Ht 5\' 4"  (1.626 m)   Wt 104.3 kg (230 lb)   LMP 07/27/1980   SpO2 100%   BMI 39.48 kg/m   Physical Exam  Constitutional: She is oriented to person, place, and time. She appears well-developed and well-nourished. No distress.  HENT:  Head: Normocephalic.  Eyes: Conjunctivae are normal.  Neck: Neck supple.  Cardiovascular: Normal rate, regular rhythm and normal heart sounds.  Pulmonary/Chest: Effort normal. No respiratory distress. She has no wheezes. She has rales.  Abdominal: Soft. Bowel sounds are normal. She exhibits no distension. There is no  tenderness. There is no rebound.  Musculoskeletal: She exhibits no edema.  Neurological: She is alert and oriented to person, place, and time.  Skin: Skin is warm and dry.  Psychiatric: She has a normal mood and affect. Her behavior is normal.  Nursing note and vitals reviewed.    ED Treatments / Results  Labs (all labs ordered are listed, but only abnormal results are displayed) Labs Reviewed  BASIC METABOLIC PANEL - Abnormal; Notable for the following components:      Result Value   CO2 35 (*)    Glucose, Bld 110 (*)    BUN 23 (*)    Creatinine, Ser 1.34 (*)    Calcium 8.8 (*)    GFR calc non Af Amer 37 (*)    GFR calc Af Amer 43 (*)    All other components within normal limits  CBC - Abnormal; Notable for the following components:   WBC 16.9 (*)    RBC 3.84 (*)    Hemoglobin 11.7 (*)    MCV 102.1 (*)    MCHC 29.8 (*)    RDW 19.5 (*)    All other components within normal limits  I-STAT CG4 LACTIC ACID, ED - Abnormal; Notable for the following components:   Lactic Acid, Venous 1.98 (*)    All other components within normal limits  URINALYSIS, ROUTINE W REFLEX MICROSCOPIC  BRAIN NATRIURETIC PEPTIDE  CBG MONITORING, ED  I-STAT TROPONIN, ED    EKG  EKG Interpretation  Date/Time:  Wednesday July 07 2017 13:02:45 EST Ventricular Rate:  79 PR Interval:    QRS Duration: 113 QT Interval:  395 QTC Calculation: 453 R Axis:   20 Text Interpretation:  Sinus rhythm Borderline intraventricular conduction delay Low voltage, precordial leads Nonspecific T abnormalities, lateral leads EKG similar to previous 06/26/2017 Confirmed by Brantley Stage [phone]) on 07/07/2017 1:48:37 PM       Radiology Dg Chest 2 View  Result Date: 07/07/2017 CLINICAL DATA:  76 year old female found lethargic. Hypotensive on presentation. EXAM: CHEST  2 VIEW COMPARISON:  06/15/2017 and earlier. FINDINGS: Upright AP and lateral views of the chest. Chronic very low lung volumes superimposed on  cardiomegaly and mediastinal lipomatosis. No superimposed pneumothorax or pleural effusion. Confluent bibasilar opacity most resembles atelectasis. Stable pulmonary vascularity without overt edema. No acute osseous abnormality identified. Paucity of bowel gas in the upper abdomen. IMPRESSION: Chronically low lung volumes with bibasilar atelectasis. No acute cardiopulmonary abnormality. Electronically Signed   By: Genevie Ann M.D.   On: 07/07/2017 15:35    Procedures Procedures (including critical care time)  Medications Ordered in ED Medications  sodium chloride 0.9 % bolus 1,000 mL (1,000 mLs Intravenous New Bag/Given 07/07/17 1353)     Initial Impression / Assessment and Plan / ED Course  I have reviewed the triage vital signs and the nursing notes.  Pertinent labs & imaging results that were available during my care of the patient were reviewed by me and considered in my medical decision making (see chart for details).     Patient in the emergency department with near syncopal episode and hypotension.  Blood pressure is currently 80 systolic.  I will start IV fluids.  She is not tachycardic, not hypoxic, not tachypneic, afebrile.  Does not meet Sirs criteria at this time.  Recent cardiac cath with stenting, question cardiac cause for hypotension.  Will check labs, troponin, EKG, chest x-ray.   4:19 PM  Patient's blood pressure improved after IV fluids.  His 12W 580D systolic.  She continues to feel okay.  Labs showing slightly elevated creatinine, could suggest dehydration.  Otherwise labs unremarkable.  Chest x-ray unchanged.  Urinalysis negative.  Rectal temperature is normal.  Spoke with hospitalist, will admit for observation.  Question arrhythmia versus volume depletion as the cause of her near syncope.  Results discussed with patient and her husband who agreed to the plan.  Vitals:   07/07/17 1506 07/07/17 1515 07/07/17 1545 07/07/17 1600  BP:  (!) 104/53 (!) 98/47 (!) 102/46  Pulse:   74 76 71  Resp:  19 (!) 22 20  Temp: 98.8 F (37.1 C)     TempSrc: Rectal     SpO2:  97% 100% 99%  Weight:      Height:         Final Clinical Impressions(s) / ED Diagnoses   Final diagnoses:  Near syncope  Mild renal insufficiency  Hypotension, unspecified hypotension type    ED Discharge Orders    None       Jeannett Senior, PA-C 07/07/17 1620    Forde Dandy, MD 07/07/17 [phone]

## 2017-07-07 NOTE — ED Notes (Signed)
Paged admitting about pt cbg.

## 2017-07-07 NOTE — Progress Notes (Addendum)
Received patient from ED, assessment completed, VS documented, oriented patient to the room.  CHG bath completed, MRSA swab sent, MD notified of bp 78/27

## 2017-07-07 NOTE — ED Triage Notes (Signed)
Patient from [facility] with [facility] for lethargy.  EMS reports the patient was found to be lethargic by husband. On arrival to scene EMS reports blood pressure 50/30, received 560mL NS through a 22g IV in right foot. Patient lethargic but oriented x4 at this time. Patient denies pain, but states she feels very tired. Normally wears 3L O2 [place], placed on NRB by EMS for O2 sat in 80's.

## 2017-07-08 ENCOUNTER — Encounter (HOSPITAL_COMMUNITY): Payer: Self-pay

## 2017-07-08 ENCOUNTER — Other Ambulatory Visit: Payer: Self-pay

## 2017-07-08 DIAGNOSIS — R8290 Unspecified abnormal findings in urine: Secondary | ICD-10-CM

## 2017-07-08 DIAGNOSIS — N179 Acute kidney failure, unspecified: Secondary | ICD-10-CM

## 2017-07-08 DIAGNOSIS — I2510 Atherosclerotic heart disease of native coronary artery without angina pectoris: Secondary | ICD-10-CM

## 2017-07-08 DIAGNOSIS — R55 Syncope and collapse: Secondary | ICD-10-CM

## 2017-07-08 DIAGNOSIS — G9341 Metabolic encephalopathy: Secondary | ICD-10-CM

## 2017-07-08 LAB — BASIC METABOLIC PANEL
Anion gap: 6 (ref 5–15)
BUN: 15 mg/dL (ref 6–20)
CHLORIDE: 111 mmol/L (ref 101–111)
CO2: 27 mmol/L (ref 22–32)
CREATININE: 0.84 mg/dL (ref 0.44–1.00)
Calcium: 7.1 mg/dL — ABNORMAL LOW (ref 8.9–10.3)
GFR calc Af Amer: >60 mL/min (ref >60)
GFR calc non Af Amer: >60 mL/min (ref >60)
GLUCOSE: 70 mg/dL (ref 65–99)
Potassium: 4 mmol/L (ref 3.5–5.1)
Sodium: 144 mmol/L (ref 135–145)

## 2017-07-08 LAB — GLUCOSE, CAPILLARY
GLUCOSE-CAPILLARY: 68 mg/dL (ref 65–99)
GLUCOSE-CAPILLARY: 58 mg/dL — AB (ref 65–99)
GLUCOSE-CAPILLARY: 83 mg/dL (ref 65–99)
GLUCOSE-CAPILLARY: 214 mg/dL — AB (ref 65–99)
GLUCOSE-CAPILLARY: 123 mg/dL — AB (ref 65–99)
Glucose-Capillary: 116 mg/dL — ABNORMAL HIGH (ref 65–99)

## 2017-07-08 LAB — TROPONIN I

## 2017-07-08 LAB — MRSA PCR SCREENING: MRSA by PCR: NEGATIVE

## 2017-07-08 MED ORDER — SODIUM CHLORIDE 0.9% FLUSH
10.0000 mL | INTRAVENOUS | Status: DC | PRN
  Administered 2017-07-09 (×2): 10 mL
  Filled 2017-07-08 (×2): qty 40

## 2017-07-08 NOTE — Care Management Note (Signed)
Case Management Note Marvetta Gibbons RN, BSN Unit 4E-Case Manager [phone]  Patient Details  Name: KRYSTIN KEEVEN MRN: [id number] Date of Birth: 01/19/1941  Subjective/Objective:   Pt admitted with near syncope- acute on Chronic Resp. Failure- CHF                 Action/Plan: PTA pt from SNF- [facility]- CSW to follow for return to SNF when medically stable.   Expected Discharge Date:                  Expected Discharge Plan:  Skilled Nursing Facility  In-House Referral:  Clinical Social Work  Discharge planning Services  CM Consult  Post Acute Care Choice:    Choice offered to:     DME Arranged:    DME Agency:     HH Arranged:    [place] Agency:     Status of Service:  In process, will continue to follow  If discussed at Long Length of Stay Meetings, dates discussed:    Discharge Disposition:   Additional Comments:  Dawayne Patricia, RN 07/08/2017, 11:26 AM

## 2017-07-08 NOTE — Progress Notes (Signed)
Patient accidentally pulled out her piv.  IV team consulted and was unable to establish a piv.  Patient does not want any more sticks at this time. MD notified.

## 2017-07-08 NOTE — Consult Note (Signed)
[facility] Nurse wound consult note Reason for Consult: gluteal cleft skin damage, partial thickness Wound type: MASD, IAD Pressure Injury POA: NA Measurement: 8cm x 8cm darkened area from chronic moisture with a 1cm round partial thickness wound on medial left buttock near gluteal cleft. Wound bed:100% pale pink Drainage (amount, consistency, odor) none Periwound: outside of the darkened area skin is intact. Dressing procedure/placement/frequency: I have provided nurses with orders for Purple Top Criticaid Barrier Cream, nursing staff asking pt about her bladder control and they have decided to place PureWick external cath. Patient is on non [place], will order to try to keep Derma Therapy pads under patient. We will not follow, but will remain available to this patient, to nursing, and the medical and/or surgical teams. Please re-consult if we need to assist further.   Fara Olden, RN-C, WTA-C Wound Treatment Associate

## 2017-07-08 NOTE — Consult Note (Signed)
Advanced Heart Failure Team Consult Note   Primary Physician:   Primary Cardiologist:    Reason for Consultation: Near-syncope   HPI:    Kelli Velasquez is seen today for evaluation of near syncopal event at the request of Dr. Denton Brick.   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   She had a recent 89 day-long admission (10/31-12/1) for acute hypoxic respiratory failure secondary to acute decompensated HF which was a new diagnosis at the time. She required intubation x2 and hospital course was complicated by MSSA pneumonia and RUE DVT.  LHC 11/21 with 95% stenosis of mid RCA and s/p DES. She was discharged to SNF on Entresto 49/51, Lasix 40 QD, spironolactone 25 QD, Plavix and Eliquis 5 BID. Weight 219 pounds at time of discharge.    Yesterday morning she was sitting while talking to SW at [facility] when she suddenly became dizzy, diaphoretic and weak. Per husband, she appeared lethargic and had nonsensical speech. Denies LOC. BP 60/40 and HR 50. She had been feeling well prior to this episode, but does report poor PO intake due to new dysphagia diet. When EMS arrived BP 50/30 and 500 cc given. In the ED BP 84/38 and given 1L NS. Labs remarkable for WBC 16.9, Cr 1.34 (normal at baseline). BNP 242. CXR with low lung volumes and bibasilar atelectasis.   This AM she is like her usual self, but says she sometimes feels confused at night. She has been able to get out of bed and ambulate to the bathroom. She denies dizziness, chest pain, worsening dyspnea, orthopnea, PND, and worsening edema. She has received a total of 2.5L NS. Last BP 127/55 with HR 90. Renal function back to baseline.   Echo 10/31 EF 35%, global hypokinesis, mild LVH, G1DD  Echo 05/2016 EF 45%, hypokinesis, mild LA dilation  Myoview 2015 with  no ischemia   Review of Systems: [y] = yes, [ ]  = no   General: Weight gain [ ] ; Weight loss [ ] ; Anorexia [ ] ; Fatigue Blue.Reese ]; Fever [ ] ; Chills [ ] ; Weakness Blue.Reese ]  Cardiac: Chest pain/pressure [n ]; Resting SOB [n ]; Exertional SOB [y]; Orthopnea [n ]; Pedal Edema [y]; Palpitations [ n]; Syncope [n ]; Presyncope [y]; Paroxysmal nocturnal dyspnea[ n]  Pulmonary: Cough [n ]; Wheezing[ ] ; Hemoptysis[ ] ; Sputum [ ] ; Snoring [ ]   GI: Vomiting[ ] ; Dysphagia[ ] ; Melena[ ] ; Hematochezia [ ] ; Heartburn[ ] ; Abdominal pain [ ] ; Constipation [ ] ; Diarrhea [ ] ; BRBPR [ ]   GU: Hematuria[ ] ; Dysuria [ ] ; Nocturia[ ]   Vascular: Pain in legs with walking [ ] ; Pain in feet with lying flat [ ] ; Non-healing sores [ ] ; Stroke [ ] ; TIA [ ] ; Slurred speech [ ] ;  Neuro: Headaches[ ] ; Vertigo[n ]; Seizures[ ] ; Paresthesias[ ] ;Blurred vision [n ]; Diplopia [ n]; Vision changes [n ]  Ortho/Skin: Arthritis [ ] ; Joint pain [ ] ; Muscle pain [ ] ; Joint swelling [ ] ; Back Pain [ ] ; Rash [ ]   Psych: Depression[ ] ; Anxiety[ ]   Heme: Bleeding problems [ ] ; Clotting disorders [ ] ; Anemia [ ]   Endocrine: Diabetes [ y]; Thyroid dysfunction[ ]   Home Medications Prior to Admission medications   Medication Sig Start Date End Date Taking? Authorizing Provider  apixaban (ELIQUIS) 5 MG TABS tablet Take 1 tablet (5 mg total) by mouth 2 (two) times daily. 06/26/17  Yes Hongalgi, Lenis Dickinson, MD  clopidogrel (PLAVIX) 75 MG tablet Take 1 tablet (75 mg total) by mouth daily. 06/27/17  Yes Hongalgi, Lenis Dickinson, MD  cyanocobalamin (,VITAMIN B-12,) 1000 MCG/ML injection Inject 1,000 mcg into the muscle every 3 (three) months.   Yes [provider]  esomeprazole (NEXIUM) 40 MG capsule Take 1 capsule (40 mg total) by mouth daily. 02/02/17  Yes Tower, Wynelle Fanny, MD  ezetimibe (ZETIA) 10 MG tablet TAKE 1 TABLET (10 MG TOTAL) BY MOUTH DAILY. 02/02/17  Yes Tower, Wynelle Fanny, MD  furosemide (LASIX) 40 MG tablet Take 1 tablet (40 mg total) by mouth daily.  06/26/17  Yes Hongalgi, Lenis Dickinson, MD  insulin aspart (NOVOLOG) 100 UNIT/ML injection Inject 0-9 Units into the skin 3 (three) times daily with meals. CBG < 70: implement hypoglycemia protocol CBG 70 - 120: 0 units CBG 121 - 150: 1 unit CBG 151 - 200: 2 units CBG 201 - 250: 3 units CBG 251 - 300: 5 units CBG 301 - 350: 7 units CBG 351 - 400: 9 units CBG > 400: call MD. 06/26/17  Yes Hongalgi, Lenis Dickinson, MD  insulin glargine (LANTUS) 100 UNIT/ML injection Inject 0.22 mLs (22 Units total) into the skin daily. 06/27/17  Yes Hongalgi, Lenis Dickinson, MD  mineral oil liquid Place in ear(s) once a week. 1 drop of mineral oil each ear once weekly to soften ear wax. 07/01/17  Yes Hongalgi, Lenis Dickinson, MD  montelukast (SINGULAIR) 10 MG tablet Take 1 tablet (10 mg total) by mouth at bedtime. 02/02/17  Yes Tower, Wynelle Fanny, MD  Multiple Vitamin (MULTIVITAMIN) capsule Take 1 capsule by mouth daily.   Yes [provider]  OXYGEN Inhale 3 L into the lungs continuous. 24/7 2 lpm  [employer]   Yes [provider]  pravastatin (PRAVACHOL) 40 MG tablet Take 1 tablet (40 mg total) by mouth daily at 6 PM. 06/26/17  Yes Hongalgi, Lenis Dickinson, MD  predniSONE (DELTASONE) 10 MG tablet Take 1 tablet (10 mg total) by mouth daily with breakfast. 06/26/17  Yes Hongalgi, Lenis Dickinson, MD  PROAIR HFA 108 (90 BASE) MCG/ACT inhaler INHALE 2 PUFFS BY MOUTH EVERY 4 HOURS AS NEEDED FOR WHEEZING OR FOR SHORTNESS OF BREATH 02/08/15  Yes Tower, Marne A, MD  ranitidine (ZANTAC) 75 MG tablet Take 300 mg by mouth at bedtime.    Yes [provider]  sacubitril-valsartan (ENTRESTO) 49-51 MG Take 1 tablet by mouth 2 (two) times daily. 06/26/17  Yes Hongalgi, Lenis Dickinson, MD  spironolactone (ALDACTONE) 25 MG tablet Take 1 tablet (25 mg total) by mouth daily. 06/27/17  Yes Hongalgi, Lenis Dickinson, MD  acetaminophen (TYLENOL) 325 MG tablet Take 650 mg by mouth every 6 (six) hours as needed for mild pain. Take as needed per bottle     [provider]    EPIPEN 2-PAK 0.3 MG/0.3ML SOAJ injection INJECT 0.3 MLS (0.3 MG TOTAL) INTO THE MUSCLE ONCE AS NEEDED FOR ALLERGIC REACTION 02/07/15   Tower, Wynelle Fanny, MD  Hydrocortisone (GERHARDT'S BUTT CREAM) CREA Apply 1 application topically 4 (four) times daily as needed for irritation. 06/26/17   Hongalgi, Lenis Dickinson, MD  Maltodextrin-Xanthan Gum ([place]) POWD Oral, As needed, other 06/26/17   Modena Jansky, MD    Past Medical History: Past Medical History:  Diagnosis Date  . Allergy    allergic rhinitis  .  Asthma   . Chronic bronchitis ([place])   . Coronary artery disease    cath January 2011 with DEs LAD and RCA  . Dyspnea   . Full dentures   . Gallstones   . GERD (gastroesophageal reflux disease)   . History of echocardiogram    Echo 5/17: mod LVH, EF 50-55%, ant-septal HK, Gr 1 DD, mod LAE //  b.  Echo 7/17: mild concentric LVH, EF 45-50%, inf-lat, inf, inf-septal HK, mild LAE  . History of nuclear stress test    Myoview 7/17: EF 48%, small mild apical defect, no ischemia, low risk  . Hyperlipidemia   . Hypertension   . 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 Myopia   . 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 Nocturnal oxygen desaturation    o2 at night  . Obesity   . Osteoarthritis    knees, fingers, shoulders  . Overactive bladder   . Oxygen deficiency    uses oxygen all day  . Rash    and other non specific skin eruptions  . Retaining fluid    in ankles and feet  . Urinary incontinence   . Vertigo   . Wears glasses     Past Surgical History: Past Surgical History:  Procedure Laterality Date  . CATARACT EXTRACTION W/PHACO Right 12/16/2016   Procedure: CATARACT EXTRACTION PHACO AND INTRAOCULAR LENS PLACEMENT ([place])  right;  Surgeon: Leandrew Koyanagi, MD;  Location: [facility];  Service: Ophthalmology;  Laterality: Right;  . CATARACT EXTRACTION W/PHACO Left 02/03/2017    Procedure: CATARACT EXTRACTION PHACO AND INTRAOCULAR LENS PLACEMENT ([place])  Left  Complicated;  Surgeon: Leandrew Koyanagi, MD;  Location: [place];  Service: Ophthalmology;  Laterality: Left;  [place] Uses oxygen   . CHOLECYSTECTOMY  92  . COLONOSCOPY    . CORONARY ANGIOGRAPHY N/A 06/16/2017   Procedure: CORONARY ANGIOGRAPHY;  Surgeon: Larey Dresser, MD;  Location: [place] [facility];  Service: Cardiovascular;  Laterality: N/A;  . CORONARY ANGIOPLASTY WITH STENT PLACEMENT  07/2009   stent  . CORONARY STENT INTERVENTION N/A 06/16/2017   Procedure: CORONARY STENT INTERVENTION;  Surgeon: Wellington Hampshire, MD;  Location: [place] [facility];  Service: Cardiovascular;  Laterality: N/A;  . DILATION AND CURETTAGE OF UTERUS  1984  . INCISIONAL HERNIA REPAIR N/A 05/16/2013   Procedure: HERNIA REPAIR INFRAUMILICAL INCISIONAL;  Surgeon: Joyice Faster. Cornett, MD;  Location: [facility];  Service: General;  Laterality: N/A;  umbilical  . INSERTION OF MESH N/A 05/16/2013   Procedure: INSERTION OF MESH;  Surgeon: Joyice Faster. Cornett, MD;  Location: [place];  Service: General;  Laterality: N/A;  umbilical  . JOINT REPLACEMENT  2007   right total knee replacement  . RIGHT/LEFT HEART CATH AND CORONARY ANGIOGRAPHY N/A 06/16/2017   Procedure: RIGHT/LEFT HEART CATH AND CORONARY ANGIOGRAPHY;  Surgeon: Larey Dresser, MD;  Location: [place] [facility];  Service: Cardiovascular;  Laterality: N/A;  . TOTAL KNEE ARTHROPLASTY  2010   left , then vocal cord infection post op  . VIDEO BRONCHOSCOPY Bilateral 07/05/2013   Procedure: VIDEO BRONCHOSCOPY WITH FLUORO;  Surgeon: Tanda Rockers, MD;  Location: [facility];  Service: Cardiopulmonary;  Laterality: Bilateral;  . vocal cord polypectomy      Family History: Family History  Problem Relation Age of Onset  . Stroke Mother   . Diabetes Mother 12  .  Stroke Father   . Heart failure Father   . Breast cancer Maternal  Grandmother   . Colon cancer Neg Hx     Social History: Social History   Socioeconomic History  . Marital status: Married    Spouse name: None  . Number of children: None  . Years of education: None  . Highest education level: None  Social Needs  . Financial resource strain: None  . Food insecurity - worry: None  . Food insecurity - inability: None  . Transportation needs - medical: None  . Transportation needs - non-medical: None  Occupational History  . None  Tobacco Use  . Smoking status: Former Smoker    Packs/day: 1.00    Years: 25.00    Pack years: 25.00    Types: Cigarettes    Last attempt to quit: 07/27/1982    Years since quitting: 34.9  . Smokeless tobacco: Never Used  Substance and Sexual Activity  . Alcohol use: Yes    Alcohol/week: 0.0 oz    Comment: wine-rare  . Drug use: No  . Sexual activity: Not Currently  Other Topics Concern  . None  Social History Narrative  . None    Allergies:  Allergies  Allergen Reactions  . Fish Allergy Anaphylaxis  . Shellfish Allergy Anaphylaxis  . Aspirin     REACTION: nausea and vomiting High doses  . Atorvastatin     REACTION: leg pain  . Crestor [Rosuvastatin Calcium] Other (See Comments)    Muscle pain - allergy/intolerance  . Penicillins     Due to mold allergy per pt  . Simvastatin     REACTION: muscle pain  . Trandolapril     REACTION: leg pain    Objective:    Vital Signs:   Temp:  [97.6 F (36.4 C)-98.8 F (37.1 C)] 98.3 F (36.8 C) (12/13 0407) Pulse Rate:  [71-88] 88 (12/13 0407) Resp:  [17-28] 18 (12/13 0407) BP: (78-127)/(27-97) 127/55 (12/13 0407) SpO2:  [90 %-100 %] 90 % (12/13 0407) Weight:  [230 lb (104.3 kg)-237 lb 7 oz (107.7 kg)] 236 lb 11.2 oz (107.4 kg) (12/13 0850)    Weight change: Filed Weights   07/07/17 2329 07/08/17 0407 07/08/17 0850  Weight: 237 lb 7 oz (107.7 kg) 237 lb 7 oz (107.7 kg) 236 lb 11.2 oz (107.4 kg)    Intake/Output:   Intake/Output Summary (Last 24  hours) at 07/08/2017 1047 Last data filed at 07/08/2017 0300 Gross per 24 hour  Intake 1498.34 ml  Output 175 ml  Net 1323.34 ml      Physical Exam    General: lying in bed  On 2L O2 with no resp difficulty noted  HEENT: normal Neck: supple. JVP very difficult to assess due to body habitus. Looks flat  Carotids 2+ bilat; no bruits. Cor: PMI nondisplaced. Regular rate & rhythm. No rubs, gallops or murmurs. Lungs: diminished breath sounds at the bases, no wheezes or crackles  Abdomen: obese  soft, nontender, nondistended. No hepatosplenomegaly. No bruits or masses. Good bowel sounds. Extremities: no cyanosis, clubbing, rash, edema Neuro: alert & orientedx3, cranial nerves grossly intact. moves all 4 extremities w/o difficulty. Affect pleasant   Telemetry   NSR with occassional PVCs. Short episodes of bradycardia.   EKG    NSR at 79. Low voltage. No signs of acute ischemia.   Labs   Basic Metabolic Panel: Recent Labs  Lab 07/07/17 1313 07/07/17 1645  NA 144  --   K 3.9  --  CL 101  --   CO2 35*  --   GLUCOSE 110*  --   BUN 23*  --   CREATININE 1.34*  --   CALCIUM 8.8*  --   MG  --  2.0  PHOS  --  4.4    Liver Function Tests: No results for input(s): AST, ALT, ALKPHOS, BILITOT, PROT, ALBUMIN in the last 168 hours. No results for input(s): LIPASE, AMYLASE in the last 168 hours. No results for input(s): AMMONIA in the last 168 hours.  CBC: Recent Labs  Lab 07/07/17 1313  WBC 16.9*  HGB 11.7*  HCT 39.2  MCV 102.1*  PLT 250    Cardiac Enzymes: Recent Labs  Lab 07/07/17 1645 07/07/17 2337 07/08/17 0721  TROPONINI <0.03 <0.03 <0.03    BNP: BNP (last 3 results) Recent Labs    05/26/17 0610 07/07/17 1313  BNP 760.9* 242.8*    ProBNP (last 3 results) Recent Labs    01/11/17 1053  PROBNP 264.0*     CBG: Recent Labs  Lab 07/07/17 2255 07/07/17 2354 07/08/17 0413 07/08/17 0752 07/08/17 0837  GLUCAP 86 104* 68 58* 83    Coagulation  Studies: No results for input(s): LABPROT, INR in the last 72 hours.   Imaging   Dg Chest 2 View  Result Date: 07/07/2017 CLINICAL DATA:  76 year old female found lethargic. Hypotensive on presentation. EXAM: CHEST  2 VIEW COMPARISON:  06/15/2017 and earlier. FINDINGS: Upright AP and lateral views of the chest. Chronic very low lung volumes superimposed on cardiomegaly and mediastinal lipomatosis. No superimposed pneumothorax or pleural effusion. Confluent bibasilar opacity most resembles atelectasis. Stable pulmonary vascularity without overt edema. No acute osseous abnormality identified. Paucity of bowel gas in the upper abdomen. IMPRESSION: Chronically low lung volumes with bibasilar atelectasis. No acute cardiopulmonary abnormality. Electronically Signed   By: Genevie Ann M.D.   On: 07/07/2017 15:35      Medications:     Current Medications: . apixaban  5 mg Oral BID  . clopidogrel  75 mg Oral Daily  . ezetimibe  10 mg Oral Daily  . famotidine  20 mg Oral Daily  . insulin aspart  0-5 Units Subcutaneous Q4H  . montelukast  10 mg Oral QHS  . pantoprazole  40 mg Oral Daily  . pravastatin  40 mg Oral q1800  . predniSONE  10 mg Oral Q breakfast  . sodium chloride flush  3 mL Intravenous Q12H     Infusions: . cefTRIAXone (ROCEPHIN)  IV Stopped (07/07/17 2019)       Patient Profile   Ms. 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   Assessment/Plan   1. Near-syncope: Likely multifactorial from overdiuresis and active infection. BP 84/30 on admission with improvement after IV hydration. Currently normotensive and asymptomatic.   - Agree with holding Lasix in the setting of hypotension and possible systemic infection   - Recommend treating UTI. Consider resuming Rocephin pending Ucx  results.   2.  UTI: UA with leukocytes and few bacteria. Ucx pending. No recent  Ucx to review. Currently asymptomatic, but states she usually does not have any urinary symptoms until infection becomes severe. - Recommendations as above   3. HFrEF: secondary to ICM. TTE from 10/31 with EF 35% and diffuse hypokinesis. Denies symptoms of volume  overload and appears euvolemic on exam. Home Entresto, spiro, and Lasix held in the setting of profound hypotension.  - Not on beta blocker due to bradycardia  - Agree with holding Lasix as above  - Restart Entresto tomorrow if she remains stable and normotensive  4. CAD: s/p DES to LAD and prox + mid RCA (2011 and 2018). No chest pain. Troponin negative x3.  - Continue home Plavix and Eliquis - Continue pravastatin   5. HTN:  - Holding home Entresto and Lasix as above   6. AKI: Cr 1.34 on admission. Cr back to baseline today.  - Resolved   7. RUE DVT:  - Continue home Eliquis  8. BOOP: Follows up with Dr. Melvyn Novas  - On prednisone 10 mg QD    Medication concerns reviewed with patient and pharmacy team.   Length of Stay: 1  Amy Clegg, NP  07/08/2017, 10:47 AM  Advanced Heart Failure Team Pager [phone] (M-F; 7a - 4p)  Please contact [facility] for night-coverage after hours (4p -7a ) and weekends on [email or web address]  Patient seen and examined with Darrick Grinder, NP. We discussed all aspects of the encounter. I agree with the assessment and plan as stated above.   Agree that pre-syncope likely due in part to volume depletion/overdiuresis. However onset was quite abrupt and no antecedent symptoms of volume depletion (ie orthostasis). Rapid onset concerns me for a component of early sepsis. UA shows +WBCs/bacteria.  WBC 16.9 (on chronic steroids). Given presyncope I would not consider this "asymptomatic pyuria" and would favor short course of abx at least until ucx back. Will hold lasix and Entresto. CAD stable without evidence of ischemic sx.    Glori Bickers, MD  2:31 PM

## 2017-07-08 NOTE — Evaluation (Signed)
Clinical/Bedside Swallow Evaluation Patient Details  Name: Kelli Velasquez MRN: [id number] Date of Birth: [birth year]  Today's Date: 07/08/2017 Time: SLP Start Time (ACUTE ONLY): 1450 SLP Stop Time (ACUTE ONLY): 1500 SLP Time Calculation (min) (ACUTE ONLY): 10 min  Past Medical History:  Past Medical History:  Diagnosis Date  . Allergy    allergic rhinitis  . Asthma   . Chronic bronchitis ([place])   . Coronary artery disease    cath January 2011 with DEs LAD and RCA  . Dyspnea   . Full dentures   . Gallstones   . GERD (gastroesophageal reflux disease)   . History of echocardiogram    Echo 5/17: mod LVH, EF 50-55%, ant-septal HK, Gr 1 DD, mod LAE //  b.  Echo 7/17: mild concentric LVH, EF 45-50%, inf-lat, inf, inf-septal HK, mild LAE  . History of nuclear stress test    Myoview 7/17: EF 48%, small mild apical defect, no ischemia, low risk  . Hyperlipidemia   . Hypertension   . Myocardial infarction ([place])    subendocardial, initial episode, 2010 two stents placed  . Myopia   . 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 Nocturnal oxygen desaturation    o2 at night  . Obesity   . Osteoarthritis    knees, fingers, shoulders  . Overactive bladder   . Oxygen deficiency    uses oxygen all day  . Rash    and other non specific skin eruptions  . Retaining fluid    in ankles and feet  . Urinary incontinence   . Vertigo   . Wears glasses    Past Surgical History:  Past Surgical History:  Procedure Laterality Date  . CATARACT EXTRACTION W/PHACO Right 12/16/2016   Procedure: CATARACT EXTRACTION PHACO AND INTRAOCULAR LENS PLACEMENT ([place])  right;  Surgeon: Leandrew Koyanagi, MD;  Location: [facility];  Service: Ophthalmology;  Laterality: Right;  . CATARACT EXTRACTION W/PHACO Left 02/03/2017   Procedure: CATARACT EXTRACTION PHACO AND INTRAOCULAR LENS PLACEMENT ([place])  Left  Complicated;  Surgeon: Leandrew Koyanagi, MD;  Location: [facility];   Service: Ophthalmology;  Laterality: Left;  [place] Uses oxygen   . CHOLECYSTECTOMY  92  . COLONOSCOPY    . CORONARY ANGIOGRAPHY N/A 06/16/2017   Procedure: CORONARY ANGIOGRAPHY;  Surgeon: Larey Dresser, MD;  Location: [facility];  Service: Cardiovascular;  Laterality: N/A;  . CORONARY ANGIOPLASTY WITH STENT PLACEMENT  07/2009   stent  . CORONARY STENT INTERVENTION N/A 06/16/2017   Procedure: CORONARY STENT INTERVENTION;  Surgeon: Wellington Hampshire, MD;  Location: [facility];  Service: Cardiovascular;  Laterality: N/A;  . DILATION AND CURETTAGE OF UTERUS  1984  . INCISIONAL HERNIA REPAIR N/A 05/16/2013   Procedure: HERNIA REPAIR INFRAUMILICAL INCISIONAL;  Surgeon: Joyice Faster. Cornett, MD;  Location: [facility];  Service: General;  Laterality: N/A;  umbilical  . INSERTION OF MESH N/A 05/16/2013   Procedure: INSERTION OF MESH;  Surgeon: Joyice Faster. Cornett, MD;  Location: [facility];  Service: General;  Laterality: N/A;  umbilical  . JOINT REPLACEMENT  2007   right total knee replacement  . RIGHT/LEFT HEART CATH AND CORONARY ANGIOGRAPHY N/A 06/16/2017   Procedure: RIGHT/LEFT HEART CATH AND CORONARY ANGIOGRAPHY;  Surgeon: Larey Dresser, MD;  Location: [facility];  Service: Cardiovascular;  Laterality: N/A;  . TOTAL KNEE ARTHROPLASTY  2010   left , then vocal cord infection post  op  . VIDEO BRONCHOSCOPY Bilateral 07/05/2013   Procedure: VIDEO BRONCHOSCOPY WITH FLUORO;  Surgeon: Tanda Rockers, MD;  Location: [facility];  Service: Cardiopulmonary;  Laterality: Bilateral;  . vocal cord polypectomy     HPI:  HEDY GARRO a 76 y.o.femalewith medical history significant forCAD, chronic systolic heart failure, dysphagia, recurrent UTI, and hypertension. Patient was discharged to skilled nursing facility for rehabilitation therapy on 12/1 after a protracted hospitalization following acute respiratory failure secondary to ischemic CHF.  This required 2 separate episodes of intubation. Second episode was second to MSSA pneumonia. Patient has a history of BOOPas well.Patient underwent cardiac catheterization during previous hospitalization and had a stent placed to her RCA. Echo revealed EF of 35%. He was slow to recover and had dysphagia but was stable in the D2 diet at time of discharge. 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 She also developed a right upper extremity DVT secondary to PICC line and was started on Eliquis during the hospitalization. New medications initiated during previous admission were Aldactone and Entresto. The patient was sent back to the ER today via EMS after she had a near syncopal episode while seated at the nursing facility. She was actually meeting with appropriate staff in preparation for discharge back home after completing her rehab program. Initial blood pressure was 50/30 with O2 sats 80% despite baseline 2 L oxygen. A nonrebreathing mask was placed. He was also given 500 cc of normal saline in the field. Arrival to the ER she was mildly tachypneic with a blood pressure reading of 84/38. Lab work was consistent with acute kidney injury. She had a mildly elevated lactic acid of 1.98 as well as leukocytosis 16,900. Her urinalysis was also abnormal and concerning for UTI. She will be admitted for near syncope presumed related to volume depletion/orthostasis. Because of abnormal urinalysis early sepsis physiology/UTI could also be part of the etiology. Chest xray was clear.    Assessment / Plan / Recommendation Clinical Impression  Pt with recent course of ST including several instrumental swallow studies with eventual diet recommendation of dysphagia 2 with necatr thick liquids. Pt discharged [facility] on 12/30 with placement at SNF. Per husband report, pt was changed to thin liquids iwthout incident almost upon arrival at [facility]. Pt with no  respiratory decline or overt s/s of aspiration per husband report over last 2 weeks. During this BSE, pt consumed 16 oz thin liquids via straw without overt s/s of aspiration. Pt's voice although mildly hoarse, remained clear and unchanged during consumption. Pt's vitals remained within normal limits. Recommend that pt continue regular diet with thin liquids via straw with ST to follow briefly for diet tolerance. Education completed with pt, husband and nursing.  SLP Visit Diagnosis: Dysphagia, oropharyngeal phase (R13.12)    Aspiration Risk  Mild aspiration risk    Diet Recommendation Regular;Thin liquid   Liquid Administration via: Straw;Cup Medication Administration: Whole meds with liquid Supervision: Patient able to self feed;Intermittent supervision to cue for compensatory strategies Compensations: Minimize environmental distractions;Slow rate;Small sips/bites Postural Changes: Seated upright at 90 degrees    Other  Recommendations Oral Care Recommendations: Oral care BID   Follow up Recommendations Skilled Nursing facility      Frequency and Duration min 2x/week  2 weeks       Prognosis Prognosis for Safe Diet Advancement: Good      Swallow Study   General Date of Onset: 07/07/17 HPI: EVONY REZEK a 76 y.o.femalewith  medical history significant forCAD, chronic systolic heart failure, dysphagia, recurrent UTI, and hypertension. Patient was discharged to skilled nursing facility for rehabilitation therapy on 12/1 after a protracted hospitalization following acute respiratory failure secondary to ischemic CHF. This required 2 separate episodes of intubation. Second episode was second to MSSA pneumonia. Patient has a history of BOOPas well.Patient underwent cardiac catheterization during previous hospitalization and had a stent placed to her RCA. Echo revealed EF of 35%. He was slow to recover and had dysphagia but was stable in the D2 diet at time of discharge. 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 She also developed a right upper extremity DVT secondary to PICC line and was started on Eliquis during the hospitalization. New medications initiated during previous admission were Aldactone and Entresto. The patient was sent back to the ER today via EMS after she had a near syncopal episode while seated at the nursing facility. She was actually meeting with appropriate staff in preparation for discharge back home after completing her rehab program. Initial blood pressure was 50/30 with O2 sats 80% despite baseline 2 L oxygen. A nonrebreathing mask was placed. He was also given 500 cc of normal saline in the field. Arrival to the ER she was mildly tachypneic with a blood pressure reading of 84/38. Lab work was consistent with acute kidney injury. She had a mildly elevated lactic acid of 1.98 as well as leukocytosis 16,900. Her urinalysis was also abnormal and concerning for UTI. She will be admitted for near syncope presumed related to volume depletion/orthostasis. Because of abnormal urinalysis early sepsis physiology/UTI could also be part of the etiology. Chest xray was clear.  Type of Study: Bedside Swallow Evaluation Previous Swallow Assessment: see HPI Diet Prior to this Study: Regular;Thin liquids Temperature Spikes Noted: No Respiratory Status: Nasal cannula History of Recent Intubation: No Behavior/Cognition: Alert;Cooperative;Pleasant mood Oral Cavity Assessment: Within Functional Limits Oral Care Completed by SLP: Recent completion by staff Oral Cavity - Dentition: Dentures, top;Dentures, bottom Vision: Functional for self-feeding Self-Feeding Abilities: Able to feed self Patient Positioning: Upright in bed Baseline Vocal Quality: Normal(mild hoarse) Volitional Cough: Strong Volitional Swallow: Able to elicit    Oral/Motor/Sensory Function Overall Oral Motor/Sensory Function: Within  functional limits   Ice Chips Ice chips: Within functional limits   Thin Liquid Thin Liquid: Within functional limits Presentation: Straw;Self Fed    Nectar Thick Nectar Thick Liquid: Not tested   Honey Thick Honey Thick Liquid: Not tested   Puree Puree: Not tested   Solid   GO   Solid: Within functional limits Presentation: Self Fed        Maison Kestenbaum 07/08/2017,3:59 PM

## 2017-07-08 NOTE — Progress Notes (Signed)
PROGRESS NOTE  Kelli Velasquez:[id number] DOB: [birth year] DOA: 07/07/2017 PCP: Abner Greenspan, MD   LOS: 1 day   Brief Narrative / Interim history: Kelli Velasquez is a 76 y.o. female with medical history significant for CAD, chronic systolic heart failure, dysphagia, recurrent UTI, and hypertension.  Patient was discharged to skilled nursing facility for rehabilitation therapy on 12/1 after a protracted hospitalization following acute respiratory failure secondary to ischemic CHF.  This required 2 separate episodes of intubation.  Second episode was second to MSSA pneumonia.  Patient has a history of BOOP as well.  Patient underwent cardiac catheterization during previous hospitalization and had a stent placed to her RCA.  Echo revealed EF of 35%.  He was slow to recover and had dysphagia but was stable in the D2 diet at time of discharge. She was being brought back to the ER as she was hypotensive to 58N systolic and O2 sats in the 80s on baseline O2. She was given IVF and her diuretics were held.   Assessment & Plan: Principal Problem:   Near syncope Active Problems:   Acute kidney injury ([place])   Coronary artery disease   Hypertension   Acute on chronic respiratory failure with hypoxia ([facility])   Chronic systolic heart failure ([facility])   Abnormal urinalysis   Dysphagia   Acute deep vein thrombosis (DVT) of right upper extremity ([facility])   Diabetes mellitus type 2 in obese ([facility])   Acute metabolic encephalopathy   Gluteal cleft wound   Near syncope -Patient presents with altered mentation as well as suboptimal blood pressure readings that appear to have an orthostatic component without any focal neurological deficits -Suspect orthostatic hypotension in the context of treatment with Lasix, Aldactone and Entresto -she has received IVF, BP improved, continue to hold diuretics. CHF team to see, appreciate input.   Acute kidney injury  -Suspect secondary to Aldactone, Lasix and Entresto  therefore will hold these medications -Cr has now normalized.   Acute on chronic respiratory failure with hypoxia/history of BOOP -Chronically on 2 L oxygen with a degree of chronic hypercarbia noted on bmet as well as chronic hypoventilatory state -Suspect acute decrease in oxygen relative to hypoventilation since chest x-ray without any obvious abnormalities and patient not volume overloaded and actually appears dry -Continue prednisone 10 mg daily-follow-up with Dr. Melvyn Novas as an outpatient -respiratory status is at baseline this morning   Acute metabolic encephalopathy -resolved  Coronary artery disease -History of remote stents to LAD/RCA with recent stent to RCA during previous hospitalization -Continue Plavix, Zetia and statin -Not on beta-blocker prior to admission due to episodes of bradycardia in the past   Chronic systolic heart failure  -Currently compensated however her discharge weight 2 weeks ago appears to be 219 and now she is ~ 236. Patient states that her baseline is ~ 230, so I wonder whether 219 was accurate -Echocardiogram completed previous admission:(05/26/17): EF 34-40% with diffuse hypokinesis, grade 1 diastolic dysfunction and elevated LV filling pressure  Diabetes mellitus type 2 in obese  -New diagnosis previous admission -Continue Lantus and SSI  Abnormal urinalysis -Check urine culture, NGTD -continue Ceftriaxone   Dysphagia -Discharge was on D2 diet with nectar thick liquids, obtain repeat SLP evaluation  Acute deep vein thrombosis (DVT) of right upper extremity  -during prior hospital stay, Continue Eliquis  Gluteal cleft wound -Air mattress -WOC RN evaluation, "8cm x 8cm darkened area from chronic moisture with a 1cm round partial thickness wound on medial left buttock near  gluteal cleft."  Code status -d/w patient at bedside and reviewed prior hospital stay which required intubation and being on ventilator. She stated that if her  breathing is to become worse she agrees to being intubated again and she would want chest compressions if needed.    DVT prophylaxis: Eliquis Code Status: Full code. She initially opted for DNR overnight.  Family Communication: no family at bedside Disposition Plan: SNF when ready   Consultants:   Cardiology   Procedures:   None   Antimicrobials:  Ceftriaxone 12/12 >>   Subjective: - no chest pain, shortness of breath, no abdominal pain, nausea or vomiting.    Objective: Vitals:   07/08/17 0407 07/08/17 0850 07/08/17 1121 07/08/17 1129  BP: (!) 127/55   (!) 93/57  Pulse: 88   67  Resp: 18  (!) 27 (!) 27  Temp: 98.3 F (36.8 C)   97.6 F (36.4 C)  TempSrc: Oral  Oral Oral  SpO2: 90%  100% 99%  Weight: 107.7 kg (237 lb 7 oz) 107.4 kg (236 lb 11.2 oz)    Height:        Intake/Output Summary (Last 24 hours) at 07/08/2017 1412 Last data filed at 07/08/2017 0300 Gross per 24 hour  Intake 998.34 ml  Output 175 ml  Net 823.34 ml   Filed Weights   07/07/17 2329 07/08/17 0407 07/08/17 0850  Weight: 107.7 kg (237 lb 7 oz) 107.7 kg (237 lb 7 oz) 107.4 kg (236 lb 11.2 oz)    Examination:  Constitutional: NAD Eyes: lids and conjunctivae normal ENMT: Mucous membranes are moist. Respiratory: clear to auscultation bilaterally, no wheezing, no crackles. Normal respiratory effort. No accessory muscle use.  Cardiovascular: Regular rate and rhythm, no murmurs / rubs / gallops. Trace LE edema Abdomen: no tenderness. Bowel sounds positive.  Musculoskeletal: no clubbing / cyanosis.  Skin: no rashes, lesions, ulcers. No induration Neurologic: non focal   Data Reviewed: I have independently reviewed following labs and imaging studies  CBC: Recent Labs  Lab 07/07/17 1313  WBC 16.9*  HGB 11.7*  HCT 39.2  MCV 102.1*  PLT 644   Basic Metabolic Panel: Recent Labs  Lab 07/07/17 1313 07/07/17 1645 07/08/17 0928  NA 144  --  144  K 3.9  --  4.0  CL 101  --  111  CO2  35*  --  27  GLUCOSE 110*  --  70  BUN 23*  --  15  CREATININE 1.34*  --  0.84  CALCIUM 8.8*  --  7.1*  MG  --  2.0  --   PHOS  --  4.4  --    GFR: Estimated Creatinine Clearance: 69.5 mL/min (by C-G formula based on SCr of 0.84 mg/dL). Liver Function Tests: No results for input(s): AST, ALT, ALKPHOS, BILITOT, PROT, ALBUMIN in the last 168 hours. No results for input(s): LIPASE, AMYLASE in the last 168 hours. No results for input(s): AMMONIA in the last 168 hours. Coagulation Profile: No results for input(s): INR, PROTIME in the last 168 hours. Cardiac Enzymes: Recent Labs  Lab 07/07/17 1645 07/07/17 2337 07/08/17 0721  TROPONINI <0.03 <0.03 <0.03   BNP (last 3 results) Recent Labs    01/11/17 1053  PROBNP 264.0*   HbA1C: No results for input(s): HGBA1C in the last 72 hours. CBG: Recent Labs  Lab 07/07/17 2354 07/08/17 0413 07/08/17 0752 07/08/17 0837 07/08/17 1125  GLUCAP 104* 68 58* 83 214*   Lipid Profile: No results for input(s): CHOL,  HDL, LDLCALC, TRIG, CHOLHDL, LDLDIRECT in the last 72 hours. Thyroid Function Tests: No results for input(s): TSH, T4TOTAL, FREET4, T3FREE, THYROIDAB in the last 72 hours. Anemia Panel: No results for input(s): VITAMINB12, FOLATE, FERRITIN, TIBC, IRON, RETICCTPCT in the last 72 hours. Urine analysis:    Component Value Date/Time   COLORURINE YELLOW 07/07/2017 1501   APPEARANCEUR HAZY (A) 07/07/2017 1501   LABSPEC 1.008 07/07/2017 1501   PHURINE 7.0 07/07/2017 1501   GLUCOSEU NEGATIVE 07/07/2017 1501   HGBUR NEGATIVE 07/07/2017 1501   BILIRUBINUR NEGATIVE 07/07/2017 1501   BILIRUBINUR neg. 06/28/2013 1041   KETONESUR NEGATIVE 07/07/2017 1501   PROTEINUR 100 (A) 07/07/2017 1501   UROBILINOGEN 0.2 06/28/2013 1041   UROBILINOGEN 0.2 07/30/2009 1435   NITRITE NEGATIVE 07/07/2017 1501   LEUKOCYTESUR SMALL (A) 07/07/2017 1501   Sepsis Labs: Invalid input(s): PROCALCITONIN, LACTICIDVEN  Recent Results (from the past 240  hour(s))  MRSA PCR Screening     Status: None   Collection Time: 07/07/17 11:41 PM  Result Value Ref Range Status   MRSA by PCR NEGATIVE NEGATIVE Final    Comment:        The GeneXpert MRSA Assay (FDA approved for NASAL specimens only), is one component of a comprehensive MRSA colonization surveillance program. It is not intended to diagnose MRSA infection nor to guide or monitor treatment for MRSA infections.       Radiology Studies: Dg Chest 2 View  Result Date: 07/07/2017 CLINICAL DATA:  76 year old female found lethargic. Hypotensive on presentation. EXAM: CHEST  2 VIEW COMPARISON:  06/15/2017 and earlier. FINDINGS: Upright AP and lateral views of the chest. Chronic very low lung volumes superimposed on cardiomegaly and mediastinal lipomatosis. No superimposed pneumothorax or pleural effusion. Confluent bibasilar opacity most resembles atelectasis. Stable pulmonary vascularity without overt edema. No acute osseous abnormality identified. Paucity of bowel gas in the upper abdomen. IMPRESSION: Chronically low lung volumes with bibasilar atelectasis. No acute cardiopulmonary abnormality. Electronically Signed   By: Genevie Ann M.D.   On: 07/07/2017 15:35     Scheduled Meds: . apixaban  5 mg Oral BID  . clopidogrel  75 mg Oral Daily  . ezetimibe  10 mg Oral Daily  . famotidine  20 mg Oral Daily  . insulin aspart  0-5 Units Subcutaneous Q4H  . montelukast  10 mg Oral QHS  . pantoprazole  40 mg Oral Daily  . pravastatin  40 mg Oral q1800  . predniSONE  10 mg Oral Q breakfast  . sodium chloride flush  3 mL Intravenous Q12H   Continuous Infusions: . cefTRIAXone (ROCEPHIN)  IV Stopped (07/07/17 2019)    Marzetta Board, MD, PhD [facility] Pager [phone]  If 7PM-7AM, please contact night-coverage [email or web address] Password [email or web address] 07/08/2017, 2:12 PM

## 2017-07-09 ENCOUNTER — Inpatient Hospital Stay: Payer: Self-pay | Admitting: Internal Medicine

## 2017-07-09 DIAGNOSIS — I5022 Chronic systolic (congestive) heart failure: Secondary | ICD-10-CM

## 2017-07-09 LAB — CBC
HCT: 34 % — ABNORMAL LOW (ref 36.0–46.0)
Hemoglobin: 9.9 g/dL — ABNORMAL LOW (ref 12.0–15.0)
MCH: 29.7 pg (ref 26.0–34.0)
MCHC: 29.1 g/dL — AB (ref 30.0–36.0)
MCV: 102.1 fL — ABNORMAL HIGH (ref 78.0–100.0)
PLATELETS: 203 10*3/uL (ref 150–400)
RBC: 3.33 MIL/uL — ABNORMAL LOW (ref 3.87–5.11)
RDW: 19.5 % — AB (ref 11.5–15.5)
WBC: 14.6 10*3/uL — AB (ref 4.0–10.5)

## 2017-07-09 LAB — BASIC METABOLIC PANEL
ANION GAP: 6 (ref 5–15)
BUN: 11 mg/dL (ref 6–20)
CALCIUM: 8.5 mg/dL — AB (ref 8.9–10.3)
CO2: 32 mmol/L (ref 22–32)
CREATININE: 0.92 mg/dL (ref 0.44–1.00)
Chloride: 104 mmol/L (ref 101–111)
GFR, EST NON AFRICAN AMERICAN: 59 mL/min — AB (ref >60)
GLUCOSE: 106 mg/dL — AB (ref 65–99)
Potassium: 3.8 mmol/L (ref 3.5–5.1)
Sodium: 142 mmol/L (ref 135–145)

## 2017-07-09 LAB — GLUCOSE, CAPILLARY
GLUCOSE-CAPILLARY: 110 mg/dL — AB (ref 65–99)
GLUCOSE-CAPILLARY: 145 mg/dL — AB (ref 65–99)
GLUCOSE-CAPILLARY: 44 mg/dL — AB (ref 65–99)
GLUCOSE-CAPILLARY: 95 mg/dL (ref 65–99)
Glucose-Capillary: 111 mg/dL — ABNORMAL HIGH (ref 65–99)
Glucose-Capillary: 96 mg/dL (ref 65–99)
Glucose-Capillary: 112 mg/dL — ABNORMAL HIGH (ref 65–99)

## 2017-07-09 MED ORDER — SPIRONOLACTONE 12.5 MG HALF TABLET
12.5000 mg | ORAL_TABLET | Freq: Every day | ORAL | Status: DC
  Administered 2017-07-09 – 2017-07-10 (×2): 12.5 mg via ORAL
  Filled 2017-07-09 (×2): qty 1

## 2017-07-09 MED ORDER — SACUBITRIL-VALSARTAN 24-26 MG PO TABS
1.0000 | ORAL_TABLET | Freq: Two times a day (BID) | ORAL | Status: DC
  Administered 2017-07-09 – 2017-07-10 (×3): 1 via ORAL
  Filled 2017-07-09 (×4): qty 1

## 2017-07-09 NOTE — Progress Notes (Signed)
PROGRESS NOTE  Kelli Velasquez JSE:[id number] DOB: Nov 15, 1940 DOA: 07/07/2017 PCP: Abner Greenspan, MD   LOS: 2 days   Brief Narrative / Interim history: Kelli Velasquez is a 76 y.o. female with medical history significant for CAD, chronic systolic heart failure, dysphagia, recurrent UTI, and hypertension.  Patient was discharged to skilled nursing facility for rehabilitation therapy on 12/1 after a protracted hospitalization following acute respiratory failure secondary to ischemic CHF.  This required 2 separate episodes of intubation.  Second episode was second to MSSA pneumonia.  Patient has a history of BOOP as well.  Patient underwent cardiac catheterization during previous hospitalization and had a stent placed to her RCA.  Echo revealed EF of 35%.  He was slow to recover and had dysphagia but was stable in the D2 diet at time of discharge. She was being brought back to the ER as she was hypotensive to 07P systolic and O2 sats in the 80s on baseline O2. She was given IVF and her diuretics were held.  She was also found to have a UTI and was started on antibiotics  Assessment & Plan: Principal Problem:   Near syncope Active Problems:   Acute kidney injury ([place])   Coronary artery disease   Hypertension   Acute on chronic respiratory failure with hypoxia (HCC)   Chronic systolic heart failure (HCC)   Abnormal urinalysis   Dysphagia   Acute deep vein thrombosis (DVT) of right upper extremity (HCC)   Diabetes mellitus type 2 in obese (HCC)   Acute metabolic encephalopathy   Gluteal cleft wound   Near syncope -Patient presents with altered mentation as well as suboptimal blood pressure readings that appear to have an orthostatic component without any focal neurological deficits -Suspect multifactorial due to orthostatic hypotension in the context of treatment with Lasix, Aldactone and Entresto as well as early sepsis due to the UTI -she has received IVF, BP improved, heart failure team  consulted, resume spironolactone and Entresto today, continue to monitor, to consider Lasix tomorrow per heart failure team  Sepsis due to UTI -With hypotension, leukocytosis, and a urinary source.  Cultures to date with gram-negative, clinically she is responding to ceftriaxone, continue -Speciation and sensitivities pending  Acute kidney injury  -Multifactorial in the setting of hypotension/dehydration/sepsis -Cr has now normalized.   Acute on chronic respiratory failure with hypoxia/history of BOOP -Chronically on 2 L oxygen with a degree of chronic hypercarbia noted on bmet as well as chronic hypoventilatory state -Suspect acute decrease in oxygen relative to hypoventilation since chest x-ray without any obvious abnormalities and patient not volume overloaded and actually appears dry -Continue prednisone 10 mg daily-follow-up with Dr. Melvyn Novas as an outpatient -respiratory status is at baseline this morning   Acute metabolic encephalopathy -resolved  Coronary artery disease -History of remote stents to LAD/RCA with recent stent to RCA during previous hospitalization -Continue Plavix, Zetia and statin -Not on beta-blocker prior to admission due to episodes of bradycardia in the past   Chronic systolic heart failure  -She appears euvolemic, management as per heart heart failure team -Echocardiogram completed previous admission:(05/26/17): EF 34-40% with diffuse hypokinesis, grade 1 diastolic dysfunction and elevated LV filling pressure  Diabetes mellitus type 2 in obese  -New diagnosis previous admission -Continue Lantus and SSI, CBG is 90s-110  Abnormal urinalysis -Check urine culture, NGTD -continue Ceftriaxone   Dysphagia -Discharge was on D2 diet with nectar thick liquids, obtain repeat SLP evaluation  Acute deep vein thrombosis (DVT) of right upper extremity  -  during prior hospital stay, Continue Eliquis  Gluteal cleft wound -Air mattress -WOC RN evaluation,  "8cm x 8cm darkened area from chronic moisture with a 1cm round partial thickness wound on medial left buttock near gluteal cleft."  Code status -d/w patient at bedside and reviewed prior hospital stay which required intubation and being on ventilator. She stated that if her breathing is to become worse she agrees to being intubated again and she would want chest compressions if needed.    DVT prophylaxis: Eliquis Code Status: Full code. She initially opted for DNR overnight.  Family Communication: husband at bedside Disposition Plan: SNF when ready   Consultants:   Cardiology   Procedures:   None   Antimicrobials:  Ceftriaxone 12/12 >>   Subjective: -Feels well, no chest pain, shortness of breath.  She wants to go home.   Objective: Vitals:   07/09/17 0437 07/09/17 0739 07/09/17 0900 07/09/17 1132  BP: 135/70 110/80 (!) 116/58 (!) 96/56  Pulse: (!) 45 100  (!) 45  Resp: (!) 26 19  (!) 25  Temp:  98 F (36.7 C)  98.1 F (36.7 C)  TempSrc:  Oral  Oral  SpO2:  100%  99%  Weight:      Height:        Intake/Output Summary (Last 24 hours) at 07/09/2017 1322 Last data filed at 07/09/2017 1100 Gross per 24 hour  Intake 540 ml  Output 778 ml  Net -238 ml   Filed Weights   07/08/17 0407 07/08/17 0850 07/09/17 0406  Weight: 107.7 kg (237 lb 7 oz) 107.4 kg (236 lb 11.2 oz) 108 kg (238 lb 3.2 oz)    Examination:  Constitutional: NAD Eyes: lids and conjunctivae normal ENMT: Mucous membranes are moist.  Neck: normal, supple Respiratory: clear to auscultation bilaterally, no wheezing, no crackles.  Cardiovascular: Regular rate and rhythm, no murmurs / rubs / gallops. Trace LE edema. 2+ pedal pulses.  Abdomen: no tenderness. Bowel sounds positive.  Skin: no rashes, lesions, ulcers. No induration Neurologic: non focal   Data Reviewed: I have independently reviewed following labs and imaging studies  CBC: Recent Labs  Lab 07/07/17 1313 07/09/17 0900  WBC 16.9*  14.6*  HGB 11.7* 9.9*  HCT 39.2 34.0*  MCV 102.1* 102.1*  PLT 250 761   Basic Metabolic Panel: Recent Labs  Lab 07/07/17 1313 07/07/17 1645 07/08/17 0928 07/09/17 0900  NA 144  --  144 142  K 3.9  --  4.0 3.8  CL 101  --  111 104  CO2 35*  --  27 32  GLUCOSE 110*  --  70 106*  BUN 23*  --  15 11  CREATININE 1.34*  --  0.84 0.92  CALCIUM 8.8*  --  7.1* 8.5*  MG  --  2.0  --   --   PHOS  --  4.4  --   --    GFR: Estimated Creatinine Clearance: 63.7 mL/min (by C-G formula based on SCr of 0.92 mg/dL). Liver Function Tests: No results for input(s): AST, ALT, ALKPHOS, BILITOT, PROT, ALBUMIN in the last 168 hours. No results for input(s): LIPASE, AMYLASE in the last 168 hours. No results for input(s): AMMONIA in the last 168 hours. Coagulation Profile: No results for input(s): INR, PROTIME in the last 168 hours. Cardiac Enzymes: Recent Labs  Lab 07/07/17 1645 07/07/17 2337 07/08/17 0721  TROPONINI <0.03 <0.03 <0.03   BNP (last 3 results) Recent Labs    01/11/17 1053  PROBNP 264.0*  HbA1C: No results for input(s): HGBA1C in the last 72 hours. CBG: Recent Labs  Lab 07/09/17 0017 07/09/17 0413 07/09/17 0513 07/09/17 0801 07/09/17 1127  GLUCAP 110* 44* 111* 95 96   Lipid Profile: No results for input(s): CHOL, HDL, LDLCALC, TRIG, CHOLHDL, LDLDIRECT in the last 72 hours. Thyroid Function Tests: No results for input(s): TSH, T4TOTAL, FREET4, T3FREE, THYROIDAB in the last 72 hours. Anemia Panel: No results for input(s): VITAMINB12, FOLATE, FERRITIN, TIBC, IRON, RETICCTPCT in the last 72 hours. Urine analysis:    Component Value Date/Time   COLORURINE YELLOW 07/07/2017 1501   APPEARANCEUR HAZY (A) 07/07/2017 1501   LABSPEC 1.008 07/07/2017 1501   PHURINE 7.0 07/07/2017 1501   GLUCOSEU NEGATIVE 07/07/2017 1501   HGBUR NEGATIVE 07/07/2017 1501   BILIRUBINUR NEGATIVE 07/07/2017 1501   BILIRUBINUR neg. 06/28/2013 1041   KETONESUR NEGATIVE 07/07/2017 1501    PROTEINUR 100 (A) 07/07/2017 1501   UROBILINOGEN 0.2 06/28/2013 1041   UROBILINOGEN 0.2 07/30/2009 1435   NITRITE NEGATIVE 07/07/2017 1501   LEUKOCYTESUR SMALL (A) 07/07/2017 1501   Sepsis Labs: Invalid input(s): PROCALCITONIN, LACTICIDVEN  Recent Results (from the past 240 hour(s))  Culture, Urine     Status: Abnormal (Preliminary result)   Collection Time: 07/07/17  6:43 PM  Result Value Ref Range Status   Specimen Description URINE, RANDOM  Final   Special Requests NONE  Final   Culture (A)  Final    >=100,000 COLONIES/mL KLEBSIELLA PNEUMONIAE SUSCEPTIBILITIES TO FOLLOW    Report Status PENDING  Incomplete  MRSA PCR Screening     Status: None   Collection Time: 07/07/17 11:41 PM  Result Value Ref Range Status   MRSA by PCR NEGATIVE NEGATIVE Final    Comment:        The GeneXpert MRSA Assay (FDA approved for NASAL specimens only), is one component of a comprehensive MRSA colonization surveillance program. It is not intended to diagnose MRSA infection nor to guide or monitor treatment for MRSA infections.       Radiology Studies: Dg Chest 2 View  Result Date: 07/07/2017 CLINICAL DATA:  76 year old female found lethargic. Hypotensive on presentation. EXAM: CHEST  2 VIEW COMPARISON:  06/15/2017 and earlier. FINDINGS: Upright AP and lateral views of the chest. Chronic very low lung volumes superimposed on cardiomegaly and mediastinal lipomatosis. No superimposed pneumothorax or pleural effusion. Confluent bibasilar opacity most resembles atelectasis. Stable pulmonary vascularity without overt edema. No acute osseous abnormality identified. Paucity of bowel gas in the upper abdomen. IMPRESSION: Chronically low lung volumes with bibasilar atelectasis. No acute cardiopulmonary abnormality. Electronically Signed   By: Genevie Ann M.D.   On: 07/07/2017 15:35     Scheduled Meds: . apixaban  5 mg Oral BID  . clopidogrel  75 mg Oral Daily  . ezetimibe  10 mg Oral Daily  .  famotidine  20 mg Oral Daily  . insulin aspart  0-5 Units Subcutaneous Q4H  . montelukast  10 mg Oral QHS  . pantoprazole  40 mg Oral Daily  . pravastatin  40 mg Oral q1800  . predniSONE  10 mg Oral Q breakfast  . sacubitril-valsartan  1 tablet Oral BID  . sodium chloride flush  3 mL Intravenous Q12H  . spironolactone  12.5 mg Oral Daily   Continuous Infusions: . cefTRIAXone (ROCEPHIN)  IV Stopped (07/08/17 1834)    Marzetta Board, MD, PhD [facility] Pager [phone]  If 7PM-7AM, please contact night-coverage [email or web address] Password [email or web address] 07/09/2017, 1:22 PM

## 2017-07-09 NOTE — Progress Notes (Signed)
Patient ID: Kelli Velasquez, female   DOB: October 27, 1940, 76 y.o.   MRN: [id number]     Advanced Heart Failure Rounding Note  Primary Cardiologist: Aundra Dubin  Subjective:    Patient admitted 12/13 with hypotension/presyncope.  Found to have UTI, GNRs in urine. SBP in 110s-130s this morning.  Entresto, spironolactone, and Lasix on hold, got IV fluid yesterday.   Feels much better this morning, wants to go home.    Objective:   Weight Range: 238 lb 3.2 oz (108 kg) Body mass index is 38.45 kg/m.   Vital Signs:   Temp:  [97.6 F (36.4 C)-98 F (36.7 C)] 98 F (36.7 C) (12/14 0739) Pulse Rate:  [45-100] 100 (12/14 0739) Resp:  [17-27] 19 (12/14 0739) BP: (93-135)/(48-122) 110/80 (12/14 0739) SpO2:  [96 %-100 %] 100 % (12/14 0739) Weight:  [236 lb 11.2 oz (107.4 kg)-238 lb 3.2 oz (108 kg)] 238 lb 3.2 oz (108 kg) (12/14 0406) Last BM Date: 07/08/17  Weight change: Filed Weights   07/08/17 0407 07/08/17 0850 07/09/17 0406  Weight: 237 lb 7 oz (107.7 kg) 236 lb 11.2 oz (107.4 kg) 238 lb 3.2 oz (108 kg)    Intake/Output:   Intake/Output Summary (Last 24 hours) at 07/09/2017 0832 Last data filed at 07/09/2017 0411 Gross per 24 hour  Intake 290 ml  Output 702 ml  Net -412 ml      Physical Exam    General:  Well appearing. No resp difficulty HEENT: Normal Neck: Supple. JVP . Carotids 2+ bilat; no bruits. No lymphadenopathy or thyromegaly appreciated. Cor: PMI nondisplaced. Regular rate & rhythm. No rubs, gallops or murmurs. Lungs: Clear Abdomen: Soft, nontender, nondistended. No hepatosplenomegaly. No bruits or masses. Good bowel sounds. Extremities: No cyanosis, clubbing, rash, edema Neuro: Alert & orientedx3, cranial nerves grossly intact. moves all 4 extremities w/o difficulty. Affect pleasant   Telemetry   NSR (personally reviewed)  Labs    CBC Recent Labs    07/07/17 1313  WBC 16.9*  HGB 11.7*  HCT 39.2  MCV 102.1*  PLT 992   Basic Metabolic Panel Recent  Labs    07/07/17 1313 07/07/17 1645 07/08/17 0928  NA 144  --  144  K 3.9  --  4.0  CL 101  --  111  CO2 35*  --  27  GLUCOSE 110*  --  70  BUN 23*  --  15  CREATININE 1.34*  --  0.84  CALCIUM 8.8*  --  7.1*  MG  --  2.0  --   PHOS  --  4.4  --    Liver Function Tests No results for input(s): AST, ALT, ALKPHOS, BILITOT, PROT, ALBUMIN in the last 72 hours. No results for input(s): LIPASE, AMYLASE in the last 72 hours. Cardiac Enzymes Recent Labs    07/07/17 1645 07/07/17 2337 07/08/17 0721  TROPONINI <0.03 <0.03 <0.03    BNP: BNP (last 3 results) Recent Labs    05/26/17 0610 07/07/17 1313  BNP 760.9* 242.8*    ProBNP (last 3 results) Recent Labs    01/11/17 1053  PROBNP 264.0*     D-Dimer No results for input(s): DDIMER in the last 72 hours. Hemoglobin A1C No results for input(s): HGBA1C in the last 72 hours. Fasting Lipid Panel No results for input(s): CHOL, HDL, LDLCALC, TRIG, CHOLHDL, LDLDIRECT in the last 72 hours. Thyroid Function Tests No results for input(s): TSH, T4TOTAL, T3FREE, THYROIDAB in the last 72 hours.  Invalid input(s): FREET3  Other results:  Imaging     No results found.   Medications:     Scheduled Medications: . apixaban  5 mg Oral BID  . clopidogrel  75 mg Oral Daily  . ezetimibe  10 mg Oral Daily  . famotidine  20 mg Oral Daily  . insulin aspart  0-5 Units Subcutaneous Q4H  . montelukast  10 mg Oral QHS  . pantoprazole  40 mg Oral Daily  . pravastatin  40 mg Oral q1800  . predniSONE  10 mg Oral Q breakfast  . sacubitril-valsartan  1 tablet Oral BID  . sodium chloride flush  3 mL Intravenous Q12H  . spironolactone  12.5 mg Oral Daily     Infusions: . cefTRIAXone (ROCEPHIN)  IV Stopped (07/08/17 1834)     PRN Medications:  acetaminophen **OR** acetaminophen, albuterol, sodium chloride flush    Patient Profile   Ms. Kelli Velasquez is a 76 yo F with HFrEF secondary to ICM, CAD s/p DES to LAD and prox RCA in  2011, HTN, HLD, BOOP on chronic prednisone 10 and home oxygen admitted after near-syncopal episode secondary UTI/early sepsis in setting of cardiac meds with hypotension and AKI.    Assessment/Plan   1. Presyncope/hypotension: Suspect due to UTI with ongoing use of cardiac meds.  This morning, SBP in 110s-130s.  Creatinine yesterday pm down to 0.8.  Can slowly restart meds.  2. UTI: GNRs in urine, probably culprit for early sepsis.  She is on ceftriaxone.  3. Chronic systolic CHF: Ischemic cardiomyopathy.  Echo 10/18 with EF 35%.  She does not look volume overloaded on exam. BP improved with IV fluid and antibiotic treatment of UTI.  - Restart spironolactone at 12.5 daily and Entresto at 24/26 bid. Can increase to home doses tomorrow if BP remains stable.  - Hold Lasix today, eventually probably restart at lower dose, 20 mg daily.  - Had been off beta blocker due to episodes of bradycardia during last hospitalization.  Can re-consider low dose bisoprolol in the future.  4. CAD: Most recently had DES to [facility] in 11/18.  She has been on the combination of Plavix and Eliquis at home (had RUE DVT last hospitalization).  - Continue Plavix.  - Continue statin.  5. AKI: Mild, resolved on labs yesterday with creatinine 0.8.  Need BMET today.  6. 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  7. RUE DVT: In 11/18, related to PICC.  Plan to continue Eliquis x 3 months.   Length of Stay: 2  Kelli Champagne, MD  07/09/2017, 8:32 AM  Advanced Heart Failure Team Pager [phone] (M-F; 7a - 4p)  Please contact [facility] for night-coverage after hours (4p -7a ) and weekends on [email or web address]

## 2017-07-09 NOTE — Progress Notes (Signed)
CSW met patient and husband at bedside. Patient stated she does not want to go back to SNF but would prefer to go home with Home Health to follow. CSW spoke with both MD and RNCM in regards to patient wanting Home Health. RNCM stated she will follow up with patients accordingly. CSW signing off as patent no longer has social work needs.   Rhea Pink, MSW,  [place]

## 2017-07-09 NOTE — Care Management Note (Signed)
Case Management Note Marvetta Gibbons RN, BSN Unit 4E-Case Manager [phone]  Patient Details  Name: TAMEA BAI MRN: [id number] Date of Birth: 1940/12/29  Subjective/Objective:   Pt admitted with near syncope- acute on Chronic Resp. Failure- CHF                 Action/Plan: PTA pt from SNF- [facility]- CSW to follow for return to SNF when medically stable.   Expected Discharge Date:                  Expected Discharge Plan:  [place]  In-House Referral:  Clinical Social Work  Discharge planning Services  CM Consult  Post Acute Care Choice:  Home Health, Durable Medical Equipment Choice offered to:  Patient  DME Arranged:  [place] rolling DME Agency:  [place]:    [place] Agency:  [place]  Status of Service:  In process, will continue to follow  If discussed at Long Length of Stay Meetings, dates discussed:    Discharge Disposition:   Additional Comments:  07/09/17- 1630- Marvetta Gibbons RN, CM-  Notified by CSW that pt does not want to return to [facility] and would like to go home instead with [facility] services- in to speak with pt at bedside- per conversation pt has home 02 with [employer]- baseline 2-3L- family to bring tank for transport, pt has adjustable bed at home, and grab bars for shower with bench. Pt does need a RW for home- choice offered for [facility] services- pt does not really have a preference- [facility] is ok with her- referral called to Butch Penny with [facility]- they can provide HHPT (however if RN is ordered can not provide RN) CM to follow for orders and [facility] agency needs.   Dawayne Patricia, RN 07/09/2017, 4:29 PM

## 2017-07-10 LAB — GLUCOSE, CAPILLARY
GLUCOSE-CAPILLARY: 109 mg/dL — AB (ref 65–99)
GLUCOSE-CAPILLARY: 87 mg/dL (ref 65–99)
Glucose-Capillary: 95 mg/dL (ref 65–99)
Glucose-Capillary: 132 mg/dL — ABNORMAL HIGH (ref 65–99)

## 2017-07-10 LAB — BASIC METABOLIC PANEL
ANION GAP: 7 (ref 5–15)
BUN: 11 mg/dL (ref 6–20)
CALCIUM: 8.4 mg/dL — AB (ref 8.9–10.3)
CO2: 33 mmol/L — ABNORMAL HIGH (ref 22–32)
Chloride: 102 mmol/L (ref 101–111)
Creatinine, Ser: 0.85 mg/dL (ref 0.44–1.00)
GFR calc Af Amer: >60 mL/min (ref >60)
GLUCOSE: 109 mg/dL — AB (ref 65–99)
Potassium: 3.8 mmol/L (ref 3.5–5.1)
SODIUM: 142 mmol/L (ref 135–145)

## 2017-07-10 LAB — CBC
HCT: 32.8 % — ABNORMAL LOW (ref 36.0–46.0)
HEMOGLOBIN: 9.7 g/dL — AB (ref 12.0–15.0)
MCH: 30.2 pg (ref 26.0–34.0)
MCHC: 29.6 g/dL — AB (ref 30.0–36.0)
MCV: 102.2 fL — ABNORMAL HIGH (ref 78.0–100.0)
Platelets: 182 10*3/uL (ref 150–400)
RBC: 3.21 MIL/uL — ABNORMAL LOW (ref 3.87–5.11)
RDW: 19.6 % — AB (ref 11.5–15.5)
WBC: 13.5 10*3/uL — AB (ref 4.0–10.5)

## 2017-07-10 LAB — URINE CULTURE: Culture: >=100000 — AB

## 2017-07-10 MED ORDER — PRAVASTATIN SODIUM 40 MG PO TABS: 40.0000 mg | ORAL_TABLET | Freq: Every day | ORAL | 0 refills | Status: DC

## 2017-07-10 MED ORDER — FUROSEMIDE 20 MG PO TABS: 20.0000 mg | ORAL_TABLET | Freq: Every day | ORAL | 9 refills | Status: DC

## 2017-07-10 MED ORDER — MONTELUKAST SODIUM 10 MG PO TABS: 10.0000 mg | ORAL_TABLET | Freq: Every day | ORAL | 0 refills | Status: DC

## 2017-07-10 MED ORDER — SACUBITRIL-VALSARTAN 49-51 MG PO TABS: 1.0000 | ORAL_TABLET | Freq: Two times a day (BID) | ORAL | 0 refills | Status: DC

## 2017-07-10 MED ORDER — CLOPIDOGREL BISULFATE 75 MG PO TABS
75.0000 mg | ORAL_TABLET | Freq: Every day | ORAL | 0 refills | Status: DC
Start: 2017-07-10 — End: 2017-08-05

## 2017-07-10 MED ORDER — ALBUTEROL SULFATE HFA 108 (90 BASE) MCG/ACT IN AERS: INHALATION_SPRAY | RESPIRATORY_TRACT | 5 refills | Status: AC

## 2017-07-10 MED ORDER — INSULIN GLARGINE 100 UNIT/ML ~~LOC~~ SOLN: 22.0000 [IU] | Freq: Every day | SUBCUTANEOUS | 1 refills | Status: DC

## 2017-07-10 MED ORDER — RANITIDINE HCL 75 MG PO TABS: 300.0000 mg | ORAL_TABLET | Freq: Every day | ORAL | 0 refills | Status: DC

## 2017-07-10 MED ORDER — CIPROFLOXACIN HCL 500 MG PO TABS: 500.0000 mg | ORAL_TABLET | Freq: Two times a day (BID) | ORAL | 0 refills | Status: DC

## 2017-07-10 MED ORDER — MULTIVITAMINS PO CAPS: 1.0000 | ORAL_CAPSULE | Freq: Every day | ORAL | 0 refills | Status: DC

## 2017-07-10 MED ORDER — ESOMEPRAZOLE MAGNESIUM 40 MG PO CPDR: 40.0000 mg | DELAYED_RELEASE_CAPSULE | Freq: Every day | ORAL | 0 refills | Status: DC

## 2017-07-10 MED ORDER — SPIRONOLACTONE 25 MG PO TABS: 12.5000 mg | ORAL_TABLET | Freq: Every day | ORAL | 0 refills | Status: DC

## 2017-07-10 MED ORDER — APIXABAN 5 MG PO TABS: 5.0000 mg | ORAL_TABLET | Freq: Two times a day (BID) | ORAL | 0 refills | Status: DC

## 2017-07-10 MED ORDER — EZETIMIBE 10 MG PO TABS: ORAL_TABLET | ORAL | 0 refills | Status: DC

## 2017-07-10 MED ORDER — INSULIN ASPART 100 UNIT/ML ~~LOC~~ SOLN: 0.0000 [IU] | Freq: Three times a day (TID) | SUBCUTANEOUS | 1 refills | Status: DC

## 2017-07-10 MED ORDER — PREDNISONE 10 MG PO TABS: 10.0000 mg | ORAL_TABLET | Freq: Every day | ORAL | 0 refills | Status: DC

## 2017-07-10 MED ORDER — CIPROFLOXACIN HCL 500 MG PO TABS: 500.0000 mg | ORAL_TABLET | Freq: Two times a day (BID) | ORAL | 0 refills | Status: AC

## 2017-07-10 NOTE — Discharge Summary (Signed)
Physician Discharge Summary  Kelli Velasquez YPP:[id number] DOB: 1941/04/22 DOA: 07/07/2017  PCP: Abner Greenspan, MD  Admit date: 07/07/2017 Discharge date: 07/10/2017  Recommendations for Outpatient Follow-up:  Please note that lasix dose is now 20 mg a day due to soft blood pressure in hospital. Follow up with cardio per sch appt to make sure this dose is tolerated. Aldactone is 12.5 mg a day. Continue Cipro for 4 days on discharge for UTI.  Discharge Diagnoses:  Principal Problem:   Near syncope Active Problems:   Acute kidney injury ([place])   Coronary artery disease   Hypertension   Acute on chronic respiratory failure with hypoxia (HCC)   Chronic systolic heart failure (HCC)   Abnormal urinalysis   Dysphagia   Acute deep vein thrombosis (DVT) of right upper extremity (HCC)   Diabetes mellitus type 2 in obese (HCC)   Acute metabolic encephalopathy   Gluteal cleft wound    Discharge Condition: stable   Diet recommendation: as tolerated   History of present illness:  76 y.o.femalewith medical history significant forCAD, chronic systolic heart failure, dysphagia, recurrent UTI, and hypertension. Patient was discharged to skilled nursing facility for rehabilitation therapy on 12/1 after a protracted hospitalization following acute respiratory failure secondary to ischemic CHF. This required 2 separate episodes of intubation. Second episode was second to MSSA pneumonia. Patient has a history of BOOPas well.Patient underwent cardiac catheterization during previous hospitalization and had a stent placed to her RCA. Echo revealed EF of 35%. He was slow to recover and had dysphagia but was stable in the D2 diet at time of discharge. She was being brought back to the ER as she was hypotensive to 77 systolic and O2 sats in the 80s on baseline O2. She was given IVF and her diuretics were held.  She was also found to have a UTI and was started on antibiotics    Hospital Course:    Assessment & Plan:  Near syncope -Patient presents with altered mentation as well as suboptimal blood pressure readings that appear to have an orthostatic component without any focal neurological deficits -Suspect multifactorial due to orthostatic hypotension in the context of treatment with Lasix, Aldactone and Entresto as well as early sepsis due to the UTI -she has received IVF, BP improved, heart failure team consulted, resume spironolactone and Entresto today, continue to monitor, to consider Lasix per heart failure team - Pt will be discharged with lasix 20 mg a day, aldactone 12.5 mg a day - Follow up with cardio per sch appt  Sepsis due to UTI -With hypotension, leukocytosis, and a urinary source.  Cultures to date with gram-negative, clinically she is responding to ceftriaxone - Continue cipro for 4 days on discharge (this will complete total of 7 days of abx treatment)  Acute kidney injury -Multifactorial in the setting of hypotension/dehydration/sepsis - Cr improved   Acute on chronic respiratory failure with hypoxia/history ofBOOP -Chronically on 2 L oxygen with a degree of chronic hypercarbia noted on bmet as well as chronic hypoventilatorystate -Suspect acute decrease in oxygen relative to hypoventilation since chest x-ray without any obvious abnormalities and patient not volume overloaded and actually appears dry -Continue prednisone 10 mg daily-follow-up with Dr. Melvyn Novas as an outpatient - resp status stable   Acute metabolic encephalopathy - resolved   Coronary artery disease -History of remote stents to LAD/RCA with recent stent to RCA during previous hospitalization -Continue Plavix, Zetiaand statin -Not on beta-blocker prior to admission due to episodes of bradycardia in  the past  - follow up with cardio  Chronic systolic heart failure  -She appears euvolemic, management as per heart heart failure team -Echocardiogram completed previous  admission:(05/26/17): EF 34-40% with diffuse hypokinesis, grade 1 diastolic dysfunction and elevated LV filling pressure - stable resp status   Diabetes mellitus type 2 in obese -New diagnosis previous admission - continue insulin as prescribed   Dysphagia - regular thin liquid per SLP eval  Acute deep vein thrombosis (DVT) of right upper extremity  - Continue Eliqius  Gluteal cleft wound -Air mattress -WOC RNevaluation, "8cm x 8cm darkened area from chronic moisture with a 1cm round partial thickness wound on medial left buttock near gluteal cleft."  Code status -d/w patient at bedside and reviewed prior hospital stay which required intubation and being on ventilator. She stated that if her breathing is to become worse she agrees to being intubated again and she would want chest compressions if needed.    DVT prophylaxis: On Eliquis  Code Status: Full code. She initially opted for DNR overnight.  Family Communication: no family at the bedside    Consultants:   Cardiology   Procedures:   None   Antimicrobials:  Ceftriaxone 12/12 >> 12/15   Signed:  Leisa Lenz, MD  [facility] 07/10/2017, 12:16 PM  Pager #: [phone]  Time spent in minutes: more than 30 minutes    Discharge Exam: Vitals:   07/10/17 0410 07/10/17 0825  BP: (!) 136/50 (!) 117/94  Pulse: 94 (!) 51  Resp: (!) 21 (!) 25  Temp: 98.2 F (36.8 C)   SpO2: 94% 97%   Vitals:   07/09/17 1952 07/09/17 2339 07/10/17 0410 07/10/17 0825  BP: 124/75 111/62 (!) 136/50 (!) 117/94  Pulse: 91 80 94 (!) 51  Resp: (!) 25 (!) 22 (!) 21 (!) 25  Temp: 97.8 F (36.6 C) 97.9 F (36.6 C) 98.2 F (36.8 C)   TempSrc: Oral Axillary Axillary   SpO2: 97% 93% 94% 97%  Weight:      Height:        General: Pt is alert, not in acute distress Cardiovascular: Regular rate and rhythm, S1/S2 + Respiratory: no wheezing, no crackles, no rhonchi Abdominal: Soft, non tender, non distended, bowel  sounds +, no guarding Extremities: no cyanosis, pulses palpable bilaterally DP and PT Neuro: Grossly nonfocal  Discharge Instructions  Discharge Instructions    Call MD for:  persistant nausea and vomiting   Complete by:  As directed    Call MD for:  redness, tenderness, or signs of infection (pain, swelling, redness, odor or green/yellow discharge around incision site)   Complete by:  As directed    Call MD for:  severe uncontrolled pain   Complete by:  As directed    Diet - low sodium heart healthy   Complete by:  As directed    Discharge instructions   Complete by:  As directed    Please note that lasix dose is now 20 mg a day due to soft blood pressure in hospital. Follow up with cardio per sch appt to make sure this dose is tolerated. Aldactone is 12.5 mg a day. Continue Cipro for 4 days on discharge for UTI.   Increase activity slowly   Complete by:  As directed      Allergies as of 07/10/2017      Reactions   Fish Allergy Anaphylaxis   Shellfish Allergy Anaphylaxis   Aspirin    REACTION: nausea and vomiting High doses  Atorvastatin    REACTION: leg pain   Crestor [rosuvastatin Calcium] Other (See Comments)   Muscle pain - allergy/intolerance   Penicillins    Due to mold allergy per pt   Simvastatin    REACTION: muscle pain   Trandolapril    REACTION: leg pain      Medication List    TAKE these medications   acetaminophen 325 MG tablet Commonly known as:  TYLENOL Take 650 mg by mouth every 6 (six) hours as needed for mild pain. Take as needed per bottle   apixaban 5 MG Tabs tablet Commonly known as:  ELIQUIS Take 1 tablet (5 mg total) by mouth 2 (two) times daily.   ciprofloxacin 500 MG tablet Commonly known as:  CIPRO Take 1 tablet (500 mg total) by mouth 2 (two) times daily for 4 days.   clopidogrel 75 MG tablet Commonly known as:  PLAVIX Take 1 tablet (75 mg total) by mouth daily.   cyanocobalamin 1000 MCG/ML injection Commonly known as:   (VITAMIN B-12) Inject 1,000 mcg into the muscle every 3 (three) months.   EPIPEN 2-PAK 0.3 mg/0.3 mL Soaj injection Generic drug:  EPINEPHrine INJECT 0.3 MLS (0.3 MG TOTAL) INTO THE MUSCLE ONCE AS NEEDED FOR ALLERGIC REACTION   esomeprazole 40 MG capsule Commonly known as:  NEXIUM Take 1 capsule (40 mg total) by mouth daily.   ezetimibe 10 MG tablet Commonly known as:  ZETIA TAKE 1 TABLET (10 MG TOTAL) BY MOUTH DAILY.   furosemide 20 MG tablet Commonly known as:  LASIX Take 1 tablet (20 mg total) by mouth daily. What changed:    medication strength  how much to take   Gerhardt's butt cream Crea Apply 1 application topically 4 (four) times daily as needed for irritation.   insulin aspart 100 UNIT/ML injection Commonly known as:  novoLOG Inject 0-9 Units into the skin 3 (three) times daily with meals. CBG < 70: implement hypoglycemia protocol CBG 70 - 120: 0 units CBG 121 - 150: 1 unit CBG 151 - 200: 2 units CBG 201 - 250: 3 units CBG 251 - 300: 5 units CBG 301 - 350: 7 units CBG 351 - 400: 9 units CBG > 400: call MD.   insulin glargine 100 UNIT/ML injection Commonly known as:  LANTUS Inject 0.22 mLs (22 Units total) into the skin daily.   mineral oil liquid Place in ear(s) once a week. 1 drop of mineral oil each ear once weekly to soften ear wax.   montelukast 10 MG tablet Commonly known as:  SINGULAIR Take 1 tablet (10 mg total) by mouth at bedtime.   multivitamin capsule Take 1 capsule by mouth daily.   OXYGEN Inhale 3 L into the lungs continuous. 24/7 2 lpm  [employer]   pravastatin 40 MG tablet Commonly known as:  PRAVACHOL Take 1 tablet (40 mg total) by mouth daily at 6 PM.   predniSONE 10 MG tablet Commonly known as:  DELTASONE Take 1 tablet (10 mg total) by mouth daily with breakfast.   PROAIR HFA 108 (90 Base) MCG/ACT inhaler Generic drug:  albuterol INHALE 2 PUFFS BY MOUTH EVERY 4 HOURS AS NEEDED FOR WHEEZING OR FOR SHORTNESS OF BREATH    ranitidine 75 MG tablet Commonly known as:  ZANTAC Take 300 mg by mouth at bedtime.   RESOURCE THICKENUP CLEAR Powd Oral, As needed, other   sacubitril-valsartan 49-51 MG Commonly known as:  ENTRESTO Take 1 tablet by mouth 2 (two) times daily.   spironolactone  25 MG tablet Commonly known as:  ALDACTONE Take 0.5 tablets (12.5 mg total) by mouth daily. Start taking on:  07/11/2017 What changed:  how much to take            Durable Medical Equipment  (From admission, onward)        Start     Ordered   07/09/17 1633  For home use only DME Walker  Once    Question:  Patient needs a walker to treat with the following condition  Answer:  CHF (congestive heart failure) ([place])   07/09/17 [facility] Follow up.   Contact information: [place] [address] [place], [facility].   Specialty:  Home Health Services Contact information: [place] [address] [phone]        Abner Greenspan, MD. Schedule an appointment as soon as possible for a visit in 1 week(s).   Specialties:  Family Medicine, Radiology Contact information: [address] [place] [address] [phone]            The results of significant diagnostics from this hospitalization (including imaging, microbiology, ancillary and laboratory) are listed below for reference.    Significant Diagnostic Studies: Dg Chest 2 View  Result Date: 07/07/2017 CLINICAL DATA:  76 year old female found lethargic. Hypotensive on presentation. EXAM: CHEST  2 VIEW COMPARISON:  06/15/2017 and earlier. FINDINGS: Upright AP and lateral views of the chest. Chronic very low lung volumes superimposed on cardiomegaly and mediastinal lipomatosis. No superimposed pneumothorax or pleural effusion. Confluent bibasilar opacity most resembles atelectasis. Stable pulmonary  vascularity without overt edema. No acute osseous abnormality identified. Paucity of bowel gas in the upper abdomen. IMPRESSION: Chronically low lung volumes with bibasilar atelectasis. No acute cardiopulmonary abnormality. Electronically Signed   By: Genevie Ann M.D.   On: 07/07/2017 15:35   Ct Head Wo Contrast  Result Date: 06/18/2017 CLINICAL DATA:  Initial evaluation for acute change in mental status. EXAM: CT HEAD WITHOUT CONTRAST TECHNIQUE: Contiguous axial images were obtained from the base of the skull through the vertex without intravenous contrast. COMPARISON:  Prior MRI from 06/12/2017. FINDINGS: Brain: Generalized age related cerebral atrophy, stable. 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 No acute intracranial hemorrhage. No acute large vessel territory infarct. No mass lesion, midline shift, or mass effect. No hydrocephalus. No other extra-axial fluid collection. Vascular: No hyperdense vessel. Scattered vascular calcifications noted within the carotid siphons. Skull: Scalp soft tissues and calvarium within normal limits. Sinuses/Orbits: Globes and orbital soft tissues demonstrate no acute abnormality. Mild exophthalmos noted. Patient status post lens extraction bilaterally. Right sphenoid sinus disease again noted. Trace layering opacity noted within the left sphenoid sinus. Paranasal sinuses otherwise largely clear. Nasogastric tube in place. Chronic bilateral mastoid effusions. Other: None. IMPRESSION: 1. Stable appearance of the brain. No acute intracranial process identified. 2. Right sphenoid sinus disease with chronic bilateral mastoid effusions, stable. Electronically Signed   By: Jeannine Boga M.D.   On: 06/18/2017 19:26   Ct Head Wo Contrast  Result Date: 06/11/2017 CLINICAL DATA:  Altered mental status EXAM: CT HEAD WITHOUT CONTRAST TECHNIQUE: Contiguous axial images were obtained from the base of the skull  through the vertex without intravenous contrast. COMPARISON:  None. FINDINGS: Brain: No hemorrhage or intracranial mass is visualized.  Old appearing focus of encephalomalacia in the left frontal lobe. Asymmetric low attenuation within the left cerebellum white matter. Moderate atrophy. The ventricles are nonenlarged. Small old infarct in the left posterior white matter. Vascular: No hyperdense vessel.  Carotid artery calcification. Skull: No fracture.  Fluid within the bilateral mastoid air cells. Sinuses/Orbits: Mucosal thickening within the sphenoid and ethmoid sinuses. No acute orbital abnormality. Other: None IMPRESSION: 1. Negative for hemorrhage or mass lesion. 2. Asymmetric hypodensity within the left cerebellum white matter tracts, given history of altered mental status, suggest MRI for further evaluation. 3. Atrophy Electronically Signed   By: Donavan Foil M.D.   On: 06/11/2017 22:13   Mr Brain Wo Contrast  Result Date: 06/12/2017 CLINICAL DATA:  Initial evaluation for acute altered mental status. EXAM: MRI HEAD WITHOUT CONTRAST TECHNIQUE: Multiplanar, multiecho pulse sequences of the brain and surrounding structures were obtained without intravenous contrast. COMPARISON:  [facility] CT from 06/11/2017. FINDINGS: Brain: Study mildly degraded by motion artifact. Generalized age related cerebral atrophy. No significant cerebral white matter disease for age. 7 mm focus of T2 hyperintensity involving the cortical gray matter of the anterior left frontal lobe may reflect a small remote ischemic infarct (series 7, image 20). Tiny remote right cerebellar infarct. Cystic lucency within the deep white matter of the left parietal lobe favored to reflect a dilated perivascular space. No abnormal foci of restricted diffusion to suggest acute or subacute ischemia. Gray-white matter differentiation maintained. No evidence for acute or chronic intracranial hemorrhage. No mass lesion, midline shift or mass effect. No  hydrocephalus. 3.6 cm extra-axial cystic lesion overlying the left frontal convexity most consistent with a arachnoid cyst. No associated edema or significant mass effect. No other extra-axial fluid collection. 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 Midline structures intact and normal. Vascular: Major intracranial vascular flow voids maintained. Skull and upper cervical spine: Craniocervical junction within normal limits. Multilevel degenerative spondylolysis noted within the cervical spine. Bone marrow signal intensity within normal limits. Hyperostosis frontalis interna noted. Scalp soft tissues within normal limits. Sinuses/Orbits: Globes oral soft tissues within normal limits. Patient status post lens extraction bilaterally. Chronic right sphenoid sinus disease. Paranasal sinuses otherwise clear. Bilateral mastoid effusions noted. Other: None. IMPRESSION: 1. No acute intracranial abnormality. 2. 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 3. 3.6 cm arachnoid cyst overlying the left frontal convexity without associated edema or mass effect. Finding felt to be incidental in nature, and of doubtful clinical significance in the acute setting. 4. Chronic right sphenoid sinus disease with bilateral mastoid effusions. Electronically Signed   By: Jeannine Boga M.D.   On: 06/12/2017 03:27   Dg Chest Port 1 View  Result Date: 06/15/2017 CLINICAL DATA:  Respiratory failure EXAM: PORTABLE CHEST 1 VIEW COMPARISON:  Yesterday FINDINGS: Right upper extremity PICC with tip at the SVC level. An orogastric tube reaches the stomach. Low volume chest with indistinct opacities at the bases. Possible cardiomegaly. No visible effusion or pneumothorax. IMPRESSION: Low volume chest with atelectasis or pneumonia at the bases. No change from yesterday. Electronically Signed   By: Monte Fantasia M.D.   On: 06/15/2017 09:10   Dg Chest Port 1 View  Result Date: 06/14/2017 CLINICAL DATA:  Respiratory failure  EXAM: PORTABLE CHEST 1 VIEW COMPARISON:  Yesterday FINDINGS: Right arm PICC and feeding tube are stable. Cardiomegaly is stable. Bibasilar pulmonary opacity is stable. Pulmonary vascularity is within normal limits. IMPRESSION: Stable bibasilar atelectasis. Electronically Signed   By: Marybelle Killings M.D.   On:  06/14/2017 07:36   Dg Chest Port 1 View  Result Date: 06/13/2017 CLINICAL DATA:  Shortness of breath. EXAM: PORTABLE CHEST 1 VIEW COMPARISON:  Chest x-rays dated 06/11/2017 and 06/10/2017. FINDINGS: Enteric tube appears well positioned with tip at the level of the pylorus/duodenal bulb. Right-sided PICC line appears appropriately positioned with tip at the level of the lower SVC/ cavoatrial junction. Study is hypoinspiratory with crowding of the perihilar and bibasilar bronchovascular markings. Persistent opacity at the left lung base, probable small pleural effusion and/or atelectasis. No new lung findings. No pneumothorax seen. Atherosclerotic changes again noted at the aortic arch. IMPRESSION: 1. No interval change. Persistent opacity at the left lung base, likely small left pleural effusion and/or atelectasis. Pneumonia not excluded if febrile. 2. Stable cardiomegaly. 3. Aortic atherosclerosis. Electronically Signed   By: Franki Cabot M.D.   On: 06/13/2017 14:37   Dg Chest Port 1 View  Result Date: 06/11/2017 CLINICAL DATA:  Shortness of breath EXAM: PORTABLE CHEST 1 VIEW COMPARISON:  06/10/2017, CT chest 06/02/2017, radiograph [id number] 1,[id number] FINDINGS: Right upper extremity catheter tip overlies the cavoatrial junction. Esophageal tube tip overlies the gastroduodenal junction. Hypoventilatory changes. Small left pleural effusion. Minimal atelectasis at the right base. No change in consolidation at the left base. Stable cardiomegaly with atherosclerosis. IMPRESSION: 1. No significant interval change in small left pleural effusion and left basilar atelectasis or pneumonia 2. Hypoventilatory changes 3.  Cardiomegaly Electronically Signed   By: Donavan Foil M.D.   On: 06/11/2017 23:00   Dg Swallowing Func-speech Pathology  Result Date: 06/22/2017 Objective Swallowing Evaluation: Type of Study: MBS-Modified Barium Swallow Study  Patient Details Name: CINTHIA RODDEN MRN: [id number] Date of Birth: 01-10-1941 Today's Date: 06/22/2017 Time: SLP Start Time (ACUTE ONLY): 1307 -SLP Stop Time (ACUTE ONLY): 1323 SLP Time Calculation (min) (ACUTE ONLY): 16 min Past Medical History: Past Medical History: Diagnosis Date . Allergy   allergic rhinitis . Asthma  . Chronic bronchitis ([place])  . Coronary artery disease   cath January 2011 with DEs LAD and RCA . Dyspnea  . Full dentures  . Gallstones  . GERD (gastroesophageal reflux disease)  . History of echocardiogram   Echo 5/17: mod LVH, EF 50-55%, ant-septal HK, Gr 1 DD, mod LAE //  b.  Echo 7/17: mild concentric LVH, EF 45-50%, inf-lat, inf, inf-septal HK, mild LAE . History of nuclear stress test   Myoview 7/17: EF 48%, small mild apical defect, no ischemia, low risk . Hyperlipidemia  . Hypertension  . 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 Myopia  . 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 Nocturnal oxygen desaturation   o2 at night . Obesity  . Osteoarthritis   knees, fingers, shoulders . Overactive bladder  . Oxygen deficiency   uses oxygen all day . Rash   and other non specific skin eruptions . Retaining fluid   in ankles and feet . Urinary incontinence  . Vertigo  . Wears glasses  Past Surgical History: Past Surgical History: Procedure Laterality Date . CATARACT EXTRACTION W/PHACO Right 12/16/2016  Procedure: CATARACT EXTRACTION PHACO AND INTRAOCULAR LENS PLACEMENT ([place])  right;  Surgeon: Leandrew Koyanagi, MD;  Location: [facility];  Service: Ophthalmology;  Laterality: Right; . CATARACT EXTRACTION W/PHACO Left 02/03/2017  Procedure: CATARACT EXTRACTION PHACO AND INTRAOCULAR LENS PLACEMENT  ([place])  Left  Complicated;  Surgeon: Leandrew Koyanagi, MD;  Location: [facility];  Service: Ophthalmology;  Laterality: Left;  [place]  Uses oxygen  . CHOLECYSTECTOMY  92 . COLONOSCOPY   . CORONARY ANGIOGRAPHY N/A 06/16/2017  Procedure: CORONARY ANGIOGRAPHY;  Surgeon: Larey Dresser, MD;  Location: [facility];  Service: Cardiovascular;  Laterality: N/A; . CORONARY ANGIOPLASTY WITH STENT PLACEMENT  07/2009  stent . CORONARY STENT INTERVENTION N/A 06/16/2017  Procedure: CORONARY STENT INTERVENTION;  Surgeon: Wellington Hampshire, MD;  Location: [facility];  Service: Cardiovascular;  Laterality: N/A; . DILATION AND CURETTAGE OF UTERUS  1984 . INCISIONAL HERNIA REPAIR N/A 05/16/2013  Procedure: HERNIA REPAIR INFRAUMILICAL INCISIONAL;  Surgeon: Joyice Faster. Cornett, MD;  Location: [facility];  Service: General;  Laterality: N/A;  umbilical . INSERTION OF MESH N/A 05/16/2013  Procedure: INSERTION OF MESH;  Surgeon: Joyice Faster. Cornett, MD;  Location: [facility];  Service: General;  Laterality: N/A;  umbilical . JOINT REPLACEMENT  2007  right total knee replacement . RIGHT/LEFT HEART CATH AND CORONARY ANGIOGRAPHY N/A 06/16/2017  Procedure: RIGHT/LEFT HEART CATH AND CORONARY ANGIOGRAPHY;  Surgeon: Larey Dresser, MD;  Location: [facility];  Service: Cardiovascular;  Laterality: N/A; . TOTAL KNEE ARTHROPLASTY  2010  left , then vocal cord infection post op . VIDEO BRONCHOSCOPY Bilateral 07/05/2013  Procedure: VIDEO BRONCHOSCOPY WITH FLUORO;  Surgeon: Tanda Rockers, MD;  Location: [facility];  Service: Cardiopulmonary;  Laterality: Bilateral; . vocal cord polypectomy   HPI: 76yo female former smoker (quit 1984) with hx CAD, HTN, CHF, BOOP followed by Dr. Melvyn Novas on chronic prednisone presented 10/31 with progressive SOB, cough, wheezing, BLE edema. She had significant respiratory distress in ER and failed trial bipap requiring intubation 10/31-11/4. FEES 11/6  showed severe edema and weakness, standing secretions, and diffuse residue that could not be cleared. Recommendations at that time were to remain NPO except for a few ice chips given after oral care. Pt was reintubated 11/7-11/12 and stayed on BiPAP post-extubation. SLP was consulted to reassess swallowing.  Subjective: pt alert, remains confused but improving Assessment / Plan / Recommendation CHL IP CLINICAL IMPRESSIONS 06/22/2017 Clinical Impression Pt has improving oropharyngeal function as her overall respiratory status, phonation (glottal closure), and mentation continue to progress. She has what appears to be a cognitively-based oral dysphagia, marked by oral holding, lingual rocking, and mildly prolonged mastication. Her swallow triggers at the valleculae with all consistencies with brief pooling before the swallow. Her pharyngeal clearance is good, but mildly decreased hyolaryngeal movement and decreased glottal closure results in trace penetration of nectar thick liquids on the first few cup sips. This cleared easily with a cued throat clear, and as the study progressed, her airway protection improved - even with consecutive cup/straw sips. She did still have deeper penetration to the vocal folds with thin liquids. She triggered a throat clear once it reached the vocal folds, which seemed to clear most but not all of the penetrates from her laryngeal vestibule. Given her sensitivity to aspiration this admission and her still altered mentation, recommend to start more conservatively with Dys 2 textures and nectar thick liquids. As she continues to progress cognitively, and potentially as the NGT is removed, she has good prognosis for further advancement. SLP Visit Diagnosis Dysphagia, oropharyngeal phase (R13.12) Attention and concentration deficit following -- Frontal lobe and executive function deficit following -- Impact on safety and function Mild aspiration risk;Moderate aspiration risk   CHL IP  TREATMENT RECOMMENDATION 06/22/2017 Treatment Recommendations Therapy as outlined in treatment plan below   Prognosis 06/22/2017 Prognosis for Safe Diet Advancement  Good Barriers to Reach Goals Cognitive deficits Barriers/Prognosis Comment -- [facility] IP DIET RECOMMENDATION 06/22/2017 SLP Diet Recommendations Dysphagia 2 (Fine chop) solids;Nectar thick liquid Liquid Administration via Cup;Straw Medication Administration Whole meds with puree Compensations Slow rate;Small sips/bites;Clear throat intermittently Postural Changes Seated upright at 90 degrees   CH[facility] IP OTHER RECOMMENDATIONS 06/22/2017 Recommended Consults -- Oral Care Recommendations Oral care BID Other Recommendations --   [facility] IP FOLLOW UP RECOMMENDATIONS 06/22/2017 Follow up Recommendations Skilled Nursing facility   [facility] IP FREQUENCY AND DURATION 06/22/2017 Speech Therapy Frequency (ACUTE ONLY) min 2x/week Treatment Duration 2 weeks      [facility] IP ORAL PHASE 06/22/2017 Oral Phase Impaired Oral - Pudding Teaspoon -- Oral - Pudding Cup -- Oral - Honey Teaspoon NT Oral - Honey Cup -- Oral - Nectar Teaspoon Delayed oral transit Oral - Nectar Cup Delayed oral transit Oral - Nectar Straw Delayed oral transit Oral - Thin Teaspoon NT Oral - Thin Cup Delayed oral transit Oral - Thin Straw -- Oral - Puree Delayed oral transit Oral - Mech Soft Impaired mastication Oral - Regular -- Oral - Multi-Consistency -- Oral - Pill -- Oral Phase - Comment --  [facility] IP PHARYNGEAL PHASE 06/22/2017 Pharyngeal Phase Impaired Pharyngeal- Pudding Teaspoon -- Pharyngeal -- Pharyngeal- Pudding Cup -- Pharyngeal -- Pharyngeal- Honey Teaspoon NT Pharyngeal -- Pharyngeal- Honey Cup -- Pharyngeal -- Pharyngeal- Nectar Teaspoon Delayed swallow initiation-vallecula;Reduced anterior laryngeal mobility;Reduced laryngeal elevation;Reduced airway/laryngeal closure;Reduced tongue base retraction Pharyngeal Material does not enter airway Pharyngeal- Nectar Cup Delayed swallow initiation-vallecula;Reduced  anterior laryngeal mobility;Reduced laryngeal elevation;Reduced airway/laryngeal closure;Reduced tongue base retraction;Penetration/Aspiration during swallow Pharyngeal Material enters airway, remains ABOVE vocal cords and not ejected out Pharyngeal- Nectar Straw Delayed swallow initiation-vallecula;Reduced anterior laryngeal mobility;Reduced laryngeal elevation;Reduced airway/laryngeal closure;Reduced tongue base retraction Pharyngeal -- Pharyngeal- Thin Teaspoon NT Pharyngeal -- Pharyngeal- Thin Cup Delayed swallow initiation-vallecula;Reduced anterior laryngeal mobility;Reduced laryngeal elevation;Reduced airway/laryngeal closure;Reduced tongue base retraction;Penetration/Aspiration during swallow Pharyngeal Material enters airway, remains ABOVE vocal cords and not ejected out;Material enters airway, CONTACTS cords and then ejected out Pharyngeal- Thin Straw -- Pharyngeal -- Pharyngeal- Puree Delayed swallow initiation-vallecula;Reduced anterior laryngeal mobility;Reduced laryngeal elevation;Reduced airway/laryngeal closure;Reduced tongue base retraction;Pharyngeal residue - valleculae Pharyngeal -- Pharyngeal- Mechanical Soft Delayed swallow initiation-vallecula;Reduced anterior laryngeal mobility;Reduced laryngeal elevation;Reduced airway/laryngeal closure;Reduced tongue base retraction Pharyngeal -- Pharyngeal- Regular -- Pharyngeal -- Pharyngeal- Multi-consistency -- Pharyngeal -- Pharyngeal- Pill -- Pharyngeal -- Pharyngeal Comment --  CHL IP CERVICAL ESOPHAGEAL PHASE 06/22/2017 Cervical Esophageal Phase WFL Pudding Teaspoon -- Pudding Cup -- Honey Teaspoon -- Honey Cup -- Nectar Teaspoon -- Nectar Cup -- Nectar Straw -- Thin Teaspoon -- Thin Cup -- Thin Straw -- Puree -- Mechanical Soft -- Regular -- Multi-consistency -- Pill -- Cervical Esophageal Comment -- No flowsheet data found. [name] 06/22/2017, 1:56 PM  [name], M.A. CCC-SLP [phone]              Microbiology: Recent  Results (from the past 240 hour(s))  Culture, Urine     Status: Abnormal   Collection Time: 07/07/17  6:43 PM  Result Value Ref Range Status   Specimen Description URINE, RANDOM  Final   Special Requests NONE  Final   Culture >=100,000 COLONIES/mL KLEBSIELLA PNEUMONIAE (A)  Final   Report Status 07/10/2017 FINAL  Final   Organism ID, Bacteria KLEBSIELLA PNEUMONIAE (A)  Final      Susceptibility   Klebsiella pneumoniae - MIC*    AMPICILLIN >=32 RESISTANT Resistant     CEFAZOLIN <=4 SENSITIVE Sensitive  CEFTRIAXONE <=1 SENSITIVE Sensitive     CIPROFLOXACIN <=0.25 SENSITIVE Sensitive     GENTAMICIN <=1 SENSITIVE Sensitive     IMIPENEM <=0.25 SENSITIVE Sensitive     NITROFURANTOIN 64 INTERMEDIATE Intermediate     TRIMETH/SULFA <=20 SENSITIVE Sensitive     AMPICILLIN/SULBACTAM 4 SENSITIVE Sensitive     PIP/TAZO <=4 SENSITIVE Sensitive     Extended ESBL NEGATIVE Sensitive     * >=100,000 COLONIES/mL KLEBSIELLA PNEUMONIAE  MRSA PCR Screening     Status: None   Collection Time: 07/07/17 11:41 PM  Result Value Ref Range Status   MRSA by PCR NEGATIVE NEGATIVE Final    Comment:        The GeneXpert MRSA Assay (FDA approved for NASAL specimens only), is one component of a comprehensive MRSA colonization surveillance program. It is not intended to diagnose MRSA infection nor to guide or monitor treatment for MRSA infections.      Labs: Basic Metabolic Panel: Recent Labs  Lab 07/07/17 1313 07/07/17 1645 07/08/17 0928 07/09/17 0900 07/10/17 0334  NA 144  --  144 142 142  K 3.9  --  4.0 3.8 3.8  CL 101  --  111 104 102  CO2 35*  --  27 32 33*  GLUCOSE 110*  --  70 106* 109*  BUN 23*  --  15 11 11   CREATININE 1.34*  --  0.84 0.92 0.85  CALCIUM 8.8*  --  7.1* 8.5* 8.4*  MG  --  2.0  --   --   --   PHOS  --  4.4  --   --   --    Liver Function Tests: No results for input(s): AST, ALT, ALKPHOS, BILITOT, PROT, ALBUMIN in the last 168 hours. No results for input(s): LIPASE,  AMYLASE in the last 168 hours. No results for input(s): AMMONIA in the last 168 hours. CBC: Recent Labs  Lab 07/07/17 1313 07/09/17 0900 07/10/17 0334  WBC 16.9* 14.6* 13.5*  HGB 11.7* 9.9* 9.7*  HCT 39.2 34.0* 32.8*  MCV 102.1* 102.1* 102.2*  PLT 250 203 182   Cardiac Enzymes: Recent Labs  Lab 07/07/17 1645 07/07/17 2337 07/08/17 0721  TROPONINI <0.03 <0.03 <0.03   BNP: BNP (last 3 results) Recent Labs    05/26/17 0610 07/07/17 1313  BNP 760.9* 242.8*    ProBNP (last 3 results) Recent Labs    01/11/17 1053  PROBNP 264.0*    CBG: Recent Labs  Lab 07/09/17 2144 07/10/17 0041 07/10/17 0454 07/10/17 0815 07/10/17 1156  GLUCAP 112* 109* 87 95 132*

## 2017-07-10 NOTE — Plan of Care (Signed)
Goals to be completed as listed

## 2017-07-10 NOTE — Progress Notes (Addendum)
07/10/2017 5:25 PM Discharge AVS meds taken today and those due this evening reviewed.  Follow-up appointments and when to call md reviewed.  D/C IV and TELE.  Questions and concerns addressed.   D/C home per orders. Carney [place]ers

## 2017-07-10 NOTE — Evaluation (Signed)
Physical Therapy Evaluation Patient Details Name: Kelli Velasquez MRN: [id number] DOB: 08/15/1940 Today's Date: 07/10/2017   History of Present Illness  Kelli Velasquez is a 76 y.o. female with medical history significant for CAD, chronic systolic heart failure, dysphagia, recurrent UTI, and hypertension. Admitted  hypotensive to 16W systolic and O2 sats in the 80s on baseline O2. She was also found to have a UTI.  Clinical Impression  Pt admitted with above diagnosis. Pt currently with functional limitations due to the deficits listed below (see PT Problem List). Ambulates up to 100 feet with supervision and reliance of a rolling walker for stability. Mild dyspnea on exertion however SpO2 97% on 2L supplemental O2. No dizziness. Eager to go home. Adequate for D/c from PT standpoint once medically ready. She will benefit from skilled PT at home with HHPT upon d/c. We will follow and continue to work with Mrs. Schnoebelen until she is discharged. Pt will benefit from skilled PT to increase their independence and safety with mobility to allow discharge to the venue listed below.       Follow Up Recommendations Home health PT;Supervision for mobility/OOB    Equipment Recommendations  Rolling walker with 5" wheels    Recommendations for Other Services       Precautions / Restrictions Precautions Precautions: Fall Precaution Comments: watch O2 saturations and HR Restrictions Weight Bearing Restrictions: No      Mobility  Bed Mobility               General bed mobility comments: in recliner.  Transfers Overall transfer level: Modified independent Equipment used: Rolling walker (2 wheeled)   Sit to Stand: Modified independent (Device/Increase time)         General transfer comment: Good boost and power up from recliner. Minior instability upon standing but able to self correct. Very stable with RW for support.  Ambulation/Gait Ambulation/Gait assistance: Supervision Ambulation  Distance (Feet): 100 Feet Assistive device: Rolling walker (2 wheeled) Gait Pattern/deviations: Step-through pattern;Decreased stride length Gait velocity: decreased Gait velocity interpretation: Below normal speed for age/gender General Gait Details: Good stability with RW use. Cues for pursed lip breathing due to minor SOB. Educated on energy conservation techniques and to take standing rest break to reduce dyspnea with pursed lip breathing techniques. No significant instability noted while using RW. SpO2 97% on 2L SpO2.  Stairs Stairs: (Declines)          Wheelchair Mobility    Modified Rankin (Stroke Patients Only)       Balance Overall balance assessment: Needs assistance Sitting-balance support: No upper extremity supported;Feet supported Sitting balance-Leahy Scale: Normal     Standing balance support: No upper extremity supported Standing balance-Leahy Scale: Fair                               Pertinent Vitals/Pain Pain Assessment: No/denies pain    Home Living Family/patient expects to be discharged to:: Private residence Living Arrangements: Spouse/significant other Available Help at Discharge: Family;Available 24 hours/day Type of Home: House Home Access: Stairs to enter Entrance Stairs-Rails: None Entrance Stairs-Number of Steps: 1 Home Layout: Two level;Able to live on main level with bedroom/bathroom Home Equipment: Shower seat;Grab bars - tub/shower;Grab bars - toilet;Hand held shower head;Hospital bed      Prior Function Level of Independence: Needs assistance   Gait / Transfers Assistance Needed: Using RW for gait, coming from SNF walking up to 150 ft  ADL's /  Homemaking Assistance Needed: Coming from SNF, was receving help with         Hand Dominance   Dominant Hand: Right    Extremity/Trunk Assessment   Upper Extremity Assessment Upper Extremity Assessment: Defer to OT evaluation    Lower Extremity Assessment Lower  Extremity Assessment: Generalized weakness    Cervical / Trunk Assessment Cervical / Trunk Assessment: Normal  Communication   Communication: No difficulties  Cognition Arousal/Alertness: Awake/alert Behavior During Therapy: WFL for tasks assessed/performed Overall Cognitive Status: Within Functional Limits for tasks assessed                                        General Comments General comments (skin integrity, edema, etc.): VSS throughout bout    Exercises General Exercises - Lower Extremity Ankle Circles/Pumps: AROM;Both;10 reps;Seated Quad Sets: Strengthening;Both;10 reps;Seated Gluteal Sets: Strengthening;Both;10 reps;Seated   Assessment/Plan    PT Assessment Patient needs continued PT services  PT Problem List Decreased strength;Decreased activity tolerance;Decreased balance;Decreased mobility;Cardiopulmonary status limiting activity;Obesity       PT Treatment Interventions DME instruction;Gait training;Functional mobility training;Therapeutic activities;Therapeutic exercise;Balance training;Neuromuscular re-education;Patient/family education    PT Goals (Current goals can be found in the Care Plan section)  Acute Rehab PT Goals Patient Stated Goal: Go home soon PT Goal Formulation: With patient Time For Goal Achievement: 07/24/17 Potential to Achieve Goals: Good    Frequency Min 3X/week   Barriers to discharge        Co-evaluation               AM-PAC PT "6 Clicks" Daily Activity  Outcome Measure Difficulty turning over in bed (including adjusting bedclothes, sheets and blankets)?: A Little Difficulty moving from lying on back to sitting on the side of the bed? : A Little Difficulty sitting down on and standing up from a chair with arms (e.g., wheelchair, bedside commode, etc,.)?: None Help needed moving to and from a bed to chair (including a wheelchair)?: None Help needed walking in hospital room?: None Help needed climbing 3-5  steps with a railing? : A Little 6 Click Score: 21    End of Session Equipment Utilized During Treatment: Oxygen;Gait belt Activity Tolerance: Patient tolerated treatment well Patient left: in chair;with call bell/phone within reach;with family/visitor present   PT Visit Diagnosis: Unsteadiness on feet (R26.81);Muscle weakness (generalized) (M62.81)    Time: 0315-9458 PT Time Calculation (min) (ACUTE ONLY): 22 min   Charges:   PT Evaluation $PT Eval Moderate Complexity: 1 Mod     PT G CodesElayne Snare, [place] 07/10/2017, 10:43 AM

## 2017-07-10 NOTE — Discharge Instructions (Signed)
Spironolactone tablets What is this medicine? SPIRONOLACTONE (speer on [place] LAK tone) is a diuretic. It helps you make more urine and to lose excess water from your body. This medicine is used to treat high blood pressure, and edema or swelling from heart, kidney, or liver disease. It is also used to treat patients who make too much aldosterone or have low potassium. This medicine may be used for other purposes; ask your health care provider or pharmacist if you have questions. COMMON BRAND NAME(S): Aldactone What should I tell my health care provider before I take this medicine? They need to know if you have any of these conditions: -high blood level of potassium -kidney disease or trouble making urine -liver disease -an unusual or allergic reaction to spironolactone, other medicines, foods, dyes, or preservatives -pregnant or trying to get pregnant -breast-feeding How should I use this medicine? Take this medicine by mouth with a drink of water. Follow the directions on your prescription label. You can take it with or without food. If it upsets your stomach, take it with food. Do not take your medicine more often than directed. Remember that you will need to pass more urine after taking this medicine. Do not take your doses at a time of day that will cause you problems. Do not take at bedtime. Talk to your pediatrician regarding the use of this medicine in children. While this drug may be prescribed for selected conditions, precautions do apply. Overdosage: If you think you have taken too much of this medicine contact a poison control center or emergency room at once. NOTE: This medicine is only for you. Do not share this medicine with others. What if I miss a dose? If you miss a dose, take it as soon as you can. If it is almost time for your next dose, take only that dose. Do not take double or extra doses. What may interact with this medicine? Do not take this medicine with any of the  following medications: -eplerenone This medicine may also interact with the following medications: -corticosteroids -digoxin -lithium -medicines for high blood pressure like ACE inhibitors -skeletal muscle relaxants like tubocurarine -NSAIDs, medicines for pain and inflammation, like ibuprofen or naproxen -potassium products like salt substitute or supplements -pressor amines like norepinephrine -some diuretics This list may not describe all possible interactions. Give your health care provider a list of all the medicines, herbs, non-prescription drugs, or dietary supplements you use. Also tell them if you smoke, drink alcohol, or use illegal drugs. Some items may interact with your medicine. What should I watch for while using this medicine? Visit your doctor or health care professional for regular checks on your progress. Check your blood pressure as directed. Ask your doctor what your blood pressure should be, and when you should contact them. You may need to be on a special diet while taking this medicine. Ask your doctor. Also, ask how many glasses of fluid you need to drink a day. You must not get dehydrated. This medicine may make you feel confused, dizzy or lightheaded. Drinking alcohol and taking some medicines can make this worse. Do not drive, use machinery, or do anything that needs mental alertness until you know how this medicine affects you. Do not sit or stand up quickly. What side effects may I notice from receiving this medicine? Side effects that you should report to your doctor or health care professional as soon as possible: -allergic reactions such as skin rash or itching, hives, swelling of the  lips, mouth, tongue, or throat -black or tarry stools -fast, irregular heartbeat -fever -muscle pain, cramps -numbness, tingling in hands or feet -trouble breathing -trouble passing urine -unusual bleeding -unusually weak or tired Side effects that usually do not require  medical attention (report to your doctor or health care professional if they continue or are bothersome): -change in voice or hair growth -confusion -dizzy, drowsy -dry mouth, increased thirst -enlarged or tender breasts -headache -irregular menstrual periods -sexual difficulty, unable to have an erection -stomach upset This list may not describe all possible side effects. Call your doctor for medical advice about side effects. You may report side effects to FDA at 1-800-FDA-1088. Where should I keep my medicine? Keep out of the reach of children. Store below 25 degrees C (77 degrees F). Throw away any unused medicine after the expiration date. NOTE: This sheet is a summary. It may not cover all possible information. If you have questions about this medicine, talk to your doctor, pharmacist, or health care provider.  2018 Elsevier/Gold Standard (2010-03-25 12:51:30)  

## 2017-07-10 NOTE — Care Management (Signed)
[facility] made aware of need for RW.  [facility] unable to provide [facility] RN because of staffing.  [facility] able to provide [facility] PT and RN.  Pt made aware.

## 2017-07-10 NOTE — Progress Notes (Deleted)
07/10/2017 3:31 PM Discharge AVS meds taken today and those due this evening reviewed.  Follow-up appointments and when to call md reviewed.  D/C IV and TELE.  Questions and concerns addressed.   D/C home per orders. Carney Corners

## 2017-07-13 ENCOUNTER — Ambulatory Visit: Payer: Medicare Other | Admitting: Family Medicine

## 2017-07-13 ENCOUNTER — Encounter (HOSPITAL_COMMUNITY): Payer: Self-pay

## 2017-07-13 ENCOUNTER — Ambulatory Visit: Payer: Self-pay

## 2017-07-13 ENCOUNTER — Encounter: Payer: Self-pay | Admitting: Family Medicine

## 2017-07-13 VITALS — BP 118/56 | HR 103 | Temp 98.2°F | Ht 64.75 in

## 2017-07-13 DIAGNOSIS — E538 Deficiency of other specified B group vitamins: Secondary | ICD-10-CM

## 2017-07-13 DIAGNOSIS — K219 Gastro-esophageal reflux disease without esophagitis: Secondary | ICD-10-CM

## 2017-07-13 DIAGNOSIS — E1169 Type 2 diabetes mellitus with other specified complication: Secondary | ICD-10-CM | POA: Diagnosis not present

## 2017-07-13 DIAGNOSIS — R1310 Dysphagia, unspecified: Secondary | ICD-10-CM

## 2017-07-13 DIAGNOSIS — I10 Essential (primary) hypertension: Secondary | ICD-10-CM

## 2017-07-13 DIAGNOSIS — I5022 Chronic systolic (congestive) heart failure: Secondary | ICD-10-CM | POA: Diagnosis not present

## 2017-07-13 DIAGNOSIS — R8290 Unspecified abnormal findings in urine: Secondary | ICD-10-CM

## 2017-07-13 DIAGNOSIS — G9341 Metabolic encephalopathy: Secondary | ICD-10-CM

## 2017-07-13 DIAGNOSIS — J9611 Chronic respiratory failure with hypoxia: Secondary | ICD-10-CM

## 2017-07-13 DIAGNOSIS — R55 Syncope and collapse: Secondary | ICD-10-CM

## 2017-07-13 DIAGNOSIS — J9612 Chronic respiratory failure with hypercapnia: Secondary | ICD-10-CM

## 2017-07-13 DIAGNOSIS — E669 Obesity, unspecified: Secondary | ICD-10-CM | POA: Diagnosis not present

## 2017-07-13 DIAGNOSIS — N179 Acute kidney failure, unspecified: Secondary | ICD-10-CM

## 2017-07-13 DIAGNOSIS — I2510 Atherosclerotic heart disease of native coronary artery without angina pectoris: Secondary | ICD-10-CM

## 2017-07-13 MED ORDER — CYANOCOBALAMIN 1000 MCG/ML IJ SOLN
1000.0000 ug | Freq: Once | INTRAMUSCULAR | Status: AC
  Administered 2017-07-13: 1000 ug via INTRAMUSCULAR

## 2017-07-13 NOTE — Progress Notes (Addendum)
Subjective:    Patient ID: Kelli Velasquez, female    DOB: Jun 10, 1941, 76 y.o.   MRN: [id number]  HPI Here for f/u s/p 2 hospitalizations for respiratory failure and hypotension  First hospitalization was 10/31 to 12/1 - discharged to SNF For  Ischemic CHF with 2 episodes of intubation and stent of RCA (EF 35%) Acute hypoxic resp failure for MSSA pneumonia superimposed on BOOP Had bronchoscopy Intubation times 2 abx tx  Aggressive lasix diuresis  D/c on continued 10 mg daily prednisone to f/u with Dr Melvyn Novas  Sleep study recommended for suspected OSA cardiol f/u   Also addressed dysphagia and aphonia and hearing loss  Had NG tube shortly  F/u planned for that   Placed on lantus and SSI in hospital  Did not get the SSI filled -still using lantus  Lab Results  Component Value Date   HGBA1C 6.3 02/01/2017    2nd hosp 12/12 to 12/15 Had near syncope/hypotension from over diuresis and also a uti with AKI and ? Sepsis  Lasix dose was decreased to 20 mg  Aldactone 12.5 cipro for 4 d after d/c (resp well to ceftriaxone after cx showing klebsiella)  Lab Results  Component Value Date   CREATININE 0.85 07/10/2017   BUN 11 07/10/2017   NA 142 07/10/2017   K 3.8 07/10/2017   CL 102 07/10/2017   CO2 33 (H) 07/10/2017   Lab Results  Component Value Date   ALT 34 06/16/2017   AST 19 06/16/2017   ALKPHOS 63 06/16/2017   BILITOT 0.5 06/16/2017   Lab Results  Component Value Date   WBC 13.5 (H) 07/10/2017   HGB 9.7 (L) 07/10/2017   HCT 32.8 (L) 07/10/2017   MCV 102.2 (H) 07/10/2017   PLT 182 07/10/2017     BP Readings from Last 3 Encounters:  07/13/17 (!) 118/56  07/10/17 (!) 129/92  06/26/17 (!) 110/47   Pulse Readings from Last 3 Encounters:  07/13/17 (!) 103  07/10/17 (!) 52  06/26/17 84   Glucose is 112-120s fasting  After meals it can jump  Appetite is starting to come back  Working on low sugar and carbs   Was d/c from SNF 2 d ago  So happy to be home  ! Waiting for call from home care-PT (she is doing her PT so far on her own)  Finishing cipro  She is drinking fluids/not on a fluid restriction   Wt Readings from Last 3 Encounters:  07/09/17 238 lb 3.2 oz (108 kg)  06/26/17 219 lb (99.3 kg)  05/19/17 266 lb 8 oz (120.9 kg)  weighs daily but not told her dry weight yet  39.95 kg/m   Patient Active Problem List   Diagnosis Date Noted  . Near syncope 07/07/2017  . Acute kidney injury ([place]) 07/07/2017  . Coronary artery disease 07/07/2017  . Hypertension 07/07/2017  . Acute on chronic respiratory failure with hypoxia ([place]) 07/07/2017  . Chronic systolic heart failure ([place]) 07/07/2017  . Abnormal urinalysis 07/07/2017  . Dysphagia 07/07/2017  . Acute deep vein thrombosis (DVT) of right upper extremity ([place]) 07/07/2017  . Diabetes mellitus type 2 in obese ([place]) 07/07/2017  . Acute metabolic encephalopathy 16/60/6301  . Gluteal cleft wound 07/07/2017  . Palliative care by specialist   . 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 Hypoxemia   . Palliative care encounter   . Unstable angina ([place])   . Acute respiratory failure with hypercapnia ([place])   .  COPD exacerbation ([place])   . Acute on chronic systolic congestive heart failure ([place])   . Encounter for screening mammogram for breast cancer 02/02/2017  . Chronic combined systolic and diastolic CHF, NYHA class 2 ([place]) 02/02/2017  . Depressed mood 12/02/2016  . Routine general medical examination at a health care facility 12/30/2015  . Supraumbilical hernia 27/78/2423  . Lump in the abdomen 04/10/2015  . GERD (gastroesophageal reflux disease) 10/10/2014  . B12 deficiency 10/09/2014  . Skin lesions 10/09/2014  . Cataracts, bilateral 10/09/2014  . Dry eyes 10/09/2014  . Fatigue 05/08/2014  . Pedal edema 05/08/2014  . Shoulder tendonitis 01/24/2014  . Osteopenia 11/28/2013  . Estrogen deficiency 11/02/2013  . Dyspnea on exertion 06/27/2013  . Hernia,  incisional 04/03/2013  . 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 Cough 12/18/2012  . Chronic respiratory failure with hypoxia and hypercapnia ([place]) 10/26/2012  . Special screening for malignant neoplasms, colon 06/16/2011  . DYSPHONIA 10/10/2010  . HYPERGLYCEMIA 05/27/2010  . CAD, NATIVE VESSEL 08/19/2009  . 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 Severe obesity (BMI >= 40) ([place]) 09/01/2007  . MYOPIA 03/03/2007  . Hyperlipidemia 10/15/2006  . Essential hypertension 10/15/2006  . Allergic rhinitis 10/15/2006  . Cough variant asthma vs UACS  10/15/2006  . OVERACTIVE BLADDER 10/15/2006  . OSTEOARTHRITIS 10/15/2006  . Urinary incontinence 10/15/2006   Past Medical History:  Diagnosis Date  . Allergy    allergic rhinitis  . Asthma   . Chronic bronchitis ([place])   . Coronary artery disease    cath January 2011 with DEs LAD and RCA  . Dyspnea   . Full dentures   . Gallstones   . GERD (gastroesophageal reflux disease)   . History of echocardiogram    Echo 5/17: mod LVH, EF 50-55%, ant-septal HK, Gr 1 DD, mod LAE //  b.  Echo 7/17: mild concentric LVH, EF 45-50%, inf-lat, inf, inf-septal HK, mild LAE  . History of nuclear stress test    Myoview 7/17: EF 48%, small mild apical defect, no ischemia, low risk  . Hyperlipidemia   . Hypertension   . Myocardial infarction ([place])    subendocardial, initial episode, 2010 two stents placed  . Myopia   . 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 Nocturnal oxygen desaturation    o2 at night  . Obesity   . Osteoarthritis    knees, fingers, shoulders  . Overactive bladder   . Oxygen deficiency    uses oxygen all day  . Rash    and other non specific skin eruptions  . Retaining fluid    in ankles and feet  . Urinary incontinence   . Vertigo   . Wears glasses    Past Surgical History:  Procedure Laterality Date  . CATARACT EXTRACTION W/PHACO Right 12/16/2016   Procedure:  CATARACT EXTRACTION PHACO AND INTRAOCULAR LENS PLACEMENT ([place])  right;  Surgeon: Leandrew Koyanagi, MD;  Location: [facility];  Service: Ophthalmology;  Laterality: Right;  . CATARACT EXTRACTION W/PHACO Left 02/03/2017   Procedure: CATARACT EXTRACTION PHACO AND INTRAOCULAR LENS PLACEMENT ([place])  Left  Complicated;  Surgeon: Leandrew Koyanagi, MD;  Location: [facility];  Service: Ophthalmology;  Laterality: Left;  [place] Uses oxygen   . CHOLECYSTECTOMY  92  . COLONOSCOPY    . CORONARY ANGIOGRAPHY N/A 06/16/2017   Procedure: CORONARY ANGIOGRAPHY;  Surgeon: Larey Dresser, MD;  Location: [facility];  Service: Cardiovascular;  Laterality:  N/A;  . CORONARY ANGIOPLASTY WITH STENT PLACEMENT  07/2009   stent  . CORONARY STENT INTERVENTION N/A 06/16/2017   Procedure: CORONARY STENT INTERVENTION;  Surgeon: Wellington Hampshire, MD;  Location: [facility];  Service: Cardiovascular;  Laterality: N/A;  . DILATION AND CURETTAGE OF UTERUS  1984  . INCISIONAL HERNIA REPAIR N/A 05/16/2013   Procedure: HERNIA REPAIR INFRAUMILICAL INCISIONAL;  Surgeon: Joyice Faster. Cornett, MD;  Location: [facility];  Service: General;  Laterality: N/A;  umbilical  . INSERTION OF MESH N/A 05/16/2013   Procedure: INSERTION OF MESH;  Surgeon: Joyice Faster. Cornett, MD;  Location: [facility];  Service: General;  Laterality: N/A;  umbilical  . JOINT REPLACEMENT  2007   right total knee replacement  . RIGHT/LEFT HEART CATH AND CORONARY ANGIOGRAPHY N/A 06/16/2017   Procedure: RIGHT/LEFT HEART CATH AND CORONARY ANGIOGRAPHY;  Surgeon: Larey Dresser, MD;  Location: [facility];  Service: Cardiovascular;  Laterality: N/A;  . TOTAL KNEE ARTHROPLASTY  2010   left , then vocal cord infection post op  . VIDEO BRONCHOSCOPY Bilateral 07/05/2013   Procedure: VIDEO BRONCHOSCOPY WITH FLUORO;  Surgeon: Tanda Rockers, MD;  Location: [facility];  Service: Cardiopulmonary;   Laterality: Bilateral;  . vocal cord polypectomy     Social History   Tobacco Use  . Smoking status: Former Smoker    Packs/day: 1.00    Years: 25.00    Pack years: 25.00    Types: Cigarettes    Last attempt to quit: 07/27/1982    Years since quitting: 34.9  . Smokeless tobacco: Never Used  Substance Use Topics  . Alcohol use: Yes    Alcohol/week: 0.0 oz    Comment: wine-rare  . Drug use: No   Family History  Problem Relation Age of Onset  . Stroke Mother   . Diabetes Mother 22  . Stroke Father   . Heart failure Father   . Breast cancer Maternal Grandmother   . Colon cancer Neg Hx    Allergies  Allergen Reactions  . Fish Allergy Anaphylaxis  . Shellfish Allergy Anaphylaxis  . Aspirin     REACTION: nausea and vomiting High doses  . Atorvastatin     REACTION: leg pain  . Crestor [Rosuvastatin Calcium] Other (See Comments)    Muscle pain - allergy/intolerance  . Penicillins     Due to mold allergy per pt  . Simvastatin     REACTION: muscle pain  . Trandolapril     REACTION: leg pain   Current Outpatient Medications on File Prior to Visit  Medication Sig Dispense Refill  . acetaminophen (TYLENOL) 325 MG tablet Take 650 mg by mouth every 6 (six) hours as needed for mild pain. Take as needed per bottle     . albuterol (PROAIR HFA) 108 (90 Base) MCG/ACT inhaler INHALE 2 PUFFS BY MOUTH EVERY 4 HOURS AS NEEDED FOR WHEEZING OR FOR SHORTNESS OF BREATH 8 Inhaler 5  . apixaban (ELIQUIS) 5 MG TABS tablet Take 1 tablet (5 mg total) by mouth 2 (two) times daily. 60 tablet 0  . ciprofloxacin (CIPRO) 500 MG tablet Take 1 tablet (500 mg total) by mouth 2 (two) times daily for 4 days. 8 tablet 0  . clopidogrel (PLAVIX) 75 MG tablet Take 1 tablet (75 mg total) by mouth daily. 30 tablet 0  . cyanocobalamin (,VITAMIN B-12,) 1000 MCG/ML injection Inject 1,000 mcg into the muscle every 3 (three) months.    Earna Coder  2-PAK 0.3 MG/0.3ML SOAJ injection INJECT 0.3 MLS (0.3 MG TOTAL) INTO THE  MUSCLE ONCE AS NEEDED FOR ALLERGIC REACTION 2 Device 0  . esomeprazole (NEXIUM) 40 MG capsule Take 1 capsule (40 mg total) by mouth daily. 30 capsule 0  . ezetimibe (ZETIA) 10 MG tablet TAKE 1 TABLET (10 MG TOTAL) BY MOUTH DAILY. 30 tablet 0  . furosemide (LASIX) 20 MG tablet Take 1 tablet (20 mg total) by mouth daily. 39 tablet 9  . Hydrocortisone (GERHARDT'S BUTT CREAM) CREA Apply 1 application topically 4 (four) times daily as needed for irritation.    . insulin glargine (LANTUS) 100 UNIT/ML injection Inject 0.22 mLs (22 Units total) into the skin daily. 10 mL 1  . Maltodextrin-Xanthan Gum (RESOURCE THICKENUP CLEAR) POWD Oral, As needed, other    . mineral oil liquid Place in ear(s) once a week. 1 drop of mineral oil each ear once weekly to soften ear wax.    . montelukast (SINGULAIR) 10 MG tablet Take 1 tablet (10 mg total) by mouth at bedtime. 30 tablet 0  . Multiple Vitamin (MULTIVITAMIN) capsule Take 1 capsule by mouth daily. 30 capsule 0  . OXYGEN Inhale 3 L into the lungs continuous. 24/7 2 lpm  [employer]    . pravastatin (PRAVACHOL) 40 MG tablet Take 1 tablet (40 mg total) by mouth daily at 6 PM. 30 tablet 0  . predniSONE (DELTASONE) 10 MG tablet Take 1 tablet (10 mg total) by mouth daily with breakfast. 30 tablet 0  . ranitidine (ZANTAC) 75 MG tablet Take 4 tablets (300 mg total) by mouth at bedtime. 30 tablet 0  . sacubitril-valsartan (ENTRESTO) 49-51 MG Take 1 tablet by mouth 2 (two) times daily. 60 tablet 0  . spironolactone (ALDACTONE) 25 MG tablet Take 0.5 tablets (12.5 mg total) by mouth daily. 30 tablet 0   No current facility-administered medications on file prior to visit.     Review of Systems  Constitutional: Positive for fatigue. Negative for activity change, appetite change, fever and unexpected weight change.  HENT: Negative for congestion, ear pain, rhinorrhea, sinus pressure and sore throat.        Neg for speech or swallowing problems at this time  Eyes: Negative for  pain, redness and visual disturbance.  Respiratory: Positive for shortness of breath and stridor. Negative for cough, choking, chest tightness and wheezing.        Neg for wheezing  Pos for baseline sob with 02 Worse with exertion   Cardiovascular: Negative for chest pain, palpitations and leg swelling.       Leg edema is resolved   Gastrointestinal: Negative for abdominal pain, blood in stool, constipation and diarrhea.  Endocrine: Negative for polydipsia and polyuria.  Genitourinary: Negative for dysuria, frequency and urgency.  Musculoskeletal: Negative for arthralgias, back pain and myalgias.  Skin: Negative for pallor and rash.  Allergic/Immunologic: Negative for environmental allergies.  Neurological: Negative for dizziness, syncope, light-headedness and headaches.       Pos for generalized weakness from deconditioning  Nothing focal   Hematological: Negative for adenopathy. Does not bruise/bleed easily.  Psychiatric/Behavioral: Negative for decreased concentration and dysphoric mood. The patient is not nervous/anxious.        Objective:   Physical Exam  Constitutional: She appears well-developed and well-nourished. No distress.  obese and well appearing  Wheelchair/with 02  Not sob  HENT:  Head: Normocephalic and atraumatic.  Right Ear: External ear normal.  Left Ear: External ear normal.  Nose: Nose normal.  Mouth/Throat: Oropharynx is clear and moist.  MMM  Mild clear rhinorrhea  Nl speech  Eyes: Conjunctivae and EOM are normal. Pupils are equal, round, and reactive to light. Right eye exhibits no discharge. Left eye exhibits no discharge. No scleral icterus.  Neck: Normal range of motion. Neck supple. No JVD present. Carotid bruit is not present. No thyromegaly present.  Cardiovascular: Normal rate, regular rhythm, normal heart sounds and intact distal pulses. Exam reveals no gallop.  Pulmonary/Chest: Effort normal and breath sounds normal. No respiratory distress.  She has no wheezes. She has no rales.  Generally harsh bs  Scant crackles at both bases No wheeze/good air exch  No rales/rhonchi   Abdominal: Soft. Bowel sounds are normal. She exhibits no distension and no mass. There is no tenderness. There is no rebound and no guarding.  Musculoskeletal: She exhibits edema. She exhibits no tenderness.  Trace pedal edema/sock line  Lymphadenopathy:    She has no cervical adenopathy.  Neurological: She is alert. She has normal reflexes. No cranial nerve deficit. She exhibits normal muscle tone. Coordination normal.  Skin: Skin is warm and dry. No rash noted. No erythema. No pallor.  Multiple bruises from her blood thinners -arm/leg  No skin breakdown    Psychiatric: She has a normal mood and affect.  Mood is good  No MS change at all -mentally sharp           Assessment & Plan:   Problem List Items Addressed This Visit      Cardiovascular and Mediastinum   Chronic systolic heart failure ([place])    Prolonged hosp stay with resp failure and 2 intubations Then over diuresis Now much improved Reviewed hospital records, lab results and studies in detail  F/u with cardiology in early jan Daily weights  PT home for deconditioning  Aldactone/lasix and entresto  Happy to be home from SNF       Coronary artery disease    Causing CHF and prolonged hosp admission  Stent in RCA Improved clinically  For card f/u early jan      Essential hypertension    BP: (!) 118/56    This is improved  Was hypotensive/overdiuresed at last asmission Taking entresto Also aldactone and lasix for pulm edema /chf Lab today for bmet Continue to watch closely  No hypotensive symptoms      Relevant Orders   CBC with Differential/Platelet   Comprehensive metabolic panel   RESOLVED: Near syncope    2nd hosp from dehydration/overdiuresis and uti Now corrected and resolved         Respiratory   Chronic respiratory failure with hypoxia and hypercapnia  ([place]) - Primary    With recent hosp for CHF and MSSA pneumonia and 2 intubations Clinically improved Lab today  Reviewed hospital records, lab results and studies in detail   She will make her own pulmonary appt -needs f/u  Continue 02 and prednisone 10 mg         Digestive   RESOLVED: Dysphagia    Had in hospital Seems to be resolved now       GERD (gastroesophageal reflux disease)    Zantac was inc in hospital when in icu/intubated  If doing well later from pulm status may be able to cut back        Endocrine   Diabetes mellitus type 2 in obese [facility])    Reviewed hospital records, lab results and studies in detail  D/c with lantus 22 u daily and SSI  She did not get the SSI- will hold off for now  Am glucose is stable/well controlled  Post meal higher  Reviewed hospital records, lab results and studies in detail   Disc DM diet/ low glycemic - when better may benefit from DM teaching  Also with more activity  Still on pred 10- unsure when she will be able to wean it       Relevant Orders   Hemoglobin A1c     Nervous and Auditory   RESOLVED: Acute metabolic encephalopathy    Reviewed hospital records, lab results and studies in detail  Resolved after tx of resp failure and infections         Genitourinary   RESOLVED: Acute kidney injury [facility])    Reviewed hospital records, lab results and studies in detail  Resolved after hosp  Re check bmp today        Other   Abnormal urinalysis    Pt had klebsiella uti in hospital  Reviewed hospital records, lab results and studies in detail   tx with ceftriaxone and then outpt cipro - last dose today  Clinically resolved  Watching renal fxn        B12 deficiency    Injection today      Relevant Medications   cyanocobalamin ((VITAMIN B-12)) injection 1,000 mcg (Completed)   Severe obesity (BMI >= 40) ([facility])    Wt is down significantly after diuresis  Goal is now to work on DM diet and PT

## 2017-07-13 NOTE — Patient Instructions (Signed)
See cardiology as planned Make your own pulmonary appointment   Labs today for chemistries and A1C  B12 shot today  Glad you are doing better  Do your PT  Alert [place] if dizzy / urinary symptoms or increased shortness of breath  / fever or other symptoms   For now we will hold off the sliding scale insulin  Continue the lantus and watch your blood sugar (try to stick to a low glycemic diet )

## 2017-07-14 LAB — COMPREHENSIVE METABOLIC PANEL
ALBUMIN: 3.3 g/dL — AB (ref 3.5–5.2)
ALK PHOS: 53 U/L (ref 39–117)
ALT: 10 U/L (ref 0–35)
AST: 10 U/L (ref 0–37)
BUN: 13 mg/dL (ref 6–23)
CHLORIDE: 97 meq/L (ref 96–112)
CO2: 41 mEq/L — ABNORMAL HIGH (ref 19–32)
Calcium: 9.2 mg/dL (ref 8.4–10.5)
Creatinine, Ser: 0.76 mg/dL (ref 0.40–1.20)
GFR: 78.35 mL/min (ref >60.00)
GLUCOSE: 127 mg/dL — AB (ref 70–99)
POTASSIUM: 5.5 meq/L — AB (ref 3.5–5.1)
SODIUM: 142 meq/L (ref 135–145)
TOTAL PROTEIN: 6.5 g/dL (ref 6.0–8.3)
Total Bilirubin: 0.4 mg/dL (ref 0.2–1.2)

## 2017-07-14 LAB — CBC WITH DIFFERENTIAL/PLATELET
BASOS PCT: 1.1 % (ref 0.0–3.0)
Basophils Absolute: 0.2 10*3/uL — ABNORMAL HIGH (ref 0.0–0.1)
EOS PCT: 0.6 % (ref 0.0–5.0)
Eosinophils Absolute: 0.1 10*3/uL (ref 0.0–0.7)
HCT: 33.9 % — ABNORMAL LOW (ref 36.0–46.0)
Hemoglobin: 10.9 g/dL — ABNORMAL LOW (ref 12.0–15.0)
LYMPHS ABS: 0.9 10*3/uL (ref 0.7–4.0)
Lymphocytes Relative: 5.2 % — ABNORMAL LOW (ref 12.0–46.0)
MCHC: 32 g/dL (ref 30.0–36.0)
MCV: 97.3 fl (ref 78.0–100.0)
MONO ABS: 0.9 10*3/uL (ref 0.1–1.0)
MONOS PCT: 5.7 % (ref 3.0–12.0)
NEUTROS PCT: 87.4 % — AB (ref 43.0–77.0)
Neutro Abs: 14.4 10*3/uL — ABNORMAL HIGH (ref 1.4–7.7)
Platelets: 247 10*3/uL (ref 150.0–400.0)
RBC: 3.49 Mil/uL — AB (ref 3.87–5.11)
RDW: 19.3 % — AB (ref 11.5–15.5)
WBC: 16.5 10*3/uL — ABNORMAL HIGH (ref 4.0–10.5)

## 2017-07-14 LAB — HEMOGLOBIN A1C: Hgb A1c MFr Bld: 5.9 % (ref 4.6–6.5)

## 2017-07-14 NOTE — Assessment & Plan Note (Signed)
Injection today 

## 2017-07-14 NOTE — Assessment & Plan Note (Signed)
BP: (!) 118/56    This is improved  Was hypotensive/overdiuresed at last asmission Taking entresto Also aldactone and lasix for pulm edema /chf Lab today for bmet Continue to watch closely  No hypotensive symptoms

## 2017-07-14 NOTE — Assessment & Plan Note (Signed)
Pt had klebsiella uti in hospital  Reviewed hospital records, lab results and studies in detail   tx with ceftriaxone and then outpt cipro - last dose today  Clinically resolved  Watching renal fxn

## 2017-07-14 NOTE — Assessment & Plan Note (Signed)
Reviewed hospital records, lab results and studies in detail  D/c with lantus 22 u daily and SSI She did not get the SSI- will hold off for now  Am glucose is stable/well controlled  Post meal higher  Reviewed hospital records, lab results and studies in detail   Disc DM diet/ low glycemic - when better may benefit from DM teaching  Also with more activity  Still on pred 10- unsure when she will be able to wean it

## 2017-07-14 NOTE — Assessment & Plan Note (Signed)
Reviewed hospital records, lab results and studies in detail  Resolved after hosp  Re check bmp today

## 2017-07-14 NOTE — Assessment & Plan Note (Signed)
Reviewed hospital records, lab results and studies in detail  Resolved after tx of resp failure and infections

## 2017-07-14 NOTE — Assessment & Plan Note (Signed)
Causing CHF and prolonged hosp admission  Stent in RCA Improved clinically  For card f/u early jan

## 2017-07-14 NOTE — Assessment & Plan Note (Signed)
Had in hospital Seems to be resolved now

## 2017-07-14 NOTE — Assessment & Plan Note (Signed)
2nd hosp from dehydration/overdiuresis and uti Now corrected and resolved

## 2017-07-14 NOTE — Assessment & Plan Note (Signed)
Wt is down significantly after diuresis  Goal is now to work on DM diet and PT

## 2017-07-14 NOTE — Assessment & Plan Note (Signed)
Zantac was inc in hospital when in icu/intubated  If doing well later from pulm status may be able to cut back

## 2017-07-14 NOTE — Assessment & Plan Note (Addendum)
With recent hosp for CHF and MSSA pneumonia and 2 intubations Clinically improved Lab today  Reviewed hospital records, lab results and studies in detail   She will make her own pulmonary appt -needs f/u  Continue 02 and prednisone 10 mg

## 2017-07-14 NOTE — Assessment & Plan Note (Signed)
Prolonged hosp stay with resp failure and 2 intubations Then over diuresis Now much improved Reviewed hospital records, lab results and studies in detail  F/u with cardiology in early jan Daily weights  PT home for deconditioning  Aldactone/lasix and entresto  Happy to be home from [facility]

## 2017-07-15 ENCOUNTER — Other Ambulatory Visit: Payer: Self-pay

## 2017-07-15 DIAGNOSIS — R0902 Hypoxemia: Secondary | ICD-10-CM | POA: Diagnosis not present

## 2017-07-15 LAB — ACID FAST CULTURE WITH REFLEXED SENSITIVITIES

## 2017-07-15 LAB — ACID FAST CULTURE WITH REFLEXED SENSITIVITIES (MYCOBACTERIA): Acid Fast Culture: NEGATIVE

## 2017-07-16 ENCOUNTER — Ambulatory Visit: Payer: Self-pay | Admitting: Family Medicine

## 2017-07-17 DIAGNOSIS — E119 Type 2 diabetes mellitus without complications: Secondary | ICD-10-CM | POA: Diagnosis not present

## 2017-07-17 DIAGNOSIS — I110 Hypertensive heart disease with heart failure: Secondary | ICD-10-CM | POA: Diagnosis not present

## 2017-07-17 DIAGNOSIS — M1991 Primary osteoarthritis, unspecified site: Secondary | ICD-10-CM | POA: Diagnosis not present

## 2017-07-17 DIAGNOSIS — I5042 Chronic combined systolic (congestive) and diastolic (congestive) heart failure: Secondary | ICD-10-CM | POA: Diagnosis not present

## 2017-07-17 DIAGNOSIS — I2510 Atherosclerotic heart disease of native coronary artery without angina pectoris: Secondary | ICD-10-CM | POA: Diagnosis not present

## 2017-07-21 ENCOUNTER — Telehealth: Payer: Self-pay | Admitting: *Deleted

## 2017-07-21 NOTE — Telephone Encounter (Signed)
Copied from [place] [id number]. Topic: Inquiry >> Jul 21, 2017  8:59 AM Scherrie Gerlach wrote: Reason for CRM: Skeet Simmer with [facility] home health states the pt will benefit from home health PT    and home health aid  Would like verbal for PT 1 wk /2 2 wk /4 1 wk /1 And nurse aid for bathing 1 wk /5

## 2017-07-21 NOTE — Telephone Encounter (Signed)
Please verbally ok that order  Thanks  

## 2017-07-21 NOTE — Telephone Encounter (Signed)
Verbal order given to Lemuel 

## 2017-07-22 DIAGNOSIS — M1991 Primary osteoarthritis, unspecified site: Secondary | ICD-10-CM | POA: Diagnosis not present

## 2017-07-22 DIAGNOSIS — I110 Hypertensive heart disease with heart failure: Secondary | ICD-10-CM | POA: Diagnosis not present

## 2017-07-22 DIAGNOSIS — I5042 Chronic combined systolic (congestive) and diastolic (congestive) heart failure: Secondary | ICD-10-CM | POA: Diagnosis not present

## 2017-07-22 DIAGNOSIS — E119 Type 2 diabetes mellitus without complications: Secondary | ICD-10-CM | POA: Diagnosis not present

## 2017-07-22 DIAGNOSIS — I2510 Atherosclerotic heart disease of native coronary artery without angina pectoris: Secondary | ICD-10-CM | POA: Diagnosis not present

## 2017-07-23 ENCOUNTER — Other Ambulatory Visit (INDEPENDENT_AMBULATORY_CARE_PROVIDER_SITE_OTHER): Payer: Medicare Other

## 2017-07-23 DIAGNOSIS — I10 Essential (primary) hypertension: Secondary | ICD-10-CM | POA: Diagnosis not present

## 2017-07-23 DIAGNOSIS — I5042 Chronic combined systolic (congestive) and diastolic (congestive) heart failure: Secondary | ICD-10-CM | POA: Diagnosis not present

## 2017-07-23 LAB — POTASSIUM: Potassium: 4.3 mEq/L (ref 3.5–5.1)

## 2017-07-26 DIAGNOSIS — M1991 Primary osteoarthritis, unspecified site: Secondary | ICD-10-CM | POA: Diagnosis not present

## 2017-07-26 DIAGNOSIS — E119 Type 2 diabetes mellitus without complications: Secondary | ICD-10-CM | POA: Diagnosis not present

## 2017-07-26 DIAGNOSIS — I110 Hypertensive heart disease with heart failure: Secondary | ICD-10-CM | POA: Diagnosis not present

## 2017-07-26 DIAGNOSIS — I2510 Atherosclerotic heart disease of native coronary artery without angina pectoris: Secondary | ICD-10-CM | POA: Diagnosis not present

## 2017-07-26 DIAGNOSIS — I5042 Chronic combined systolic (congestive) and diastolic (congestive) heart failure: Secondary | ICD-10-CM | POA: Diagnosis not present

## 2017-07-29 ENCOUNTER — Ambulatory Visit (HOSPITAL_COMMUNITY)
Admission: RE | Admit: 2017-07-29 | Discharge: 2017-07-29 | Disposition: A | Discharge status: Home / Self Care | Payer: Medicare Other | Source: Ambulatory Visit | Attending: Cardiology | Admitting: Cardiology

## 2017-07-29 VITALS — BP 120/68 | HR 104 | Wt 235.4 lb

## 2017-07-29 DIAGNOSIS — Z7902 Long term (current) use of antithrombotics/antiplatelets: Secondary | ICD-10-CM | POA: Insufficient documentation

## 2017-07-29 DIAGNOSIS — J9612 Chronic respiratory failure with hypercapnia: Secondary | ICD-10-CM | POA: Diagnosis not present

## 2017-07-29 DIAGNOSIS — K219 Gastro-esophageal reflux disease without esophagitis: Secondary | ICD-10-CM | POA: Insufficient documentation

## 2017-07-29 DIAGNOSIS — Z87891 Personal history of nicotine dependence: Secondary | ICD-10-CM | POA: Insufficient documentation

## 2017-07-29 DIAGNOSIS — Z79899 Other long term (current) drug therapy: Secondary | ICD-10-CM | POA: Insufficient documentation

## 2017-07-29 DIAGNOSIS — I110 Hypertensive heart disease with heart failure: Secondary | ICD-10-CM | POA: Insufficient documentation

## 2017-07-29 DIAGNOSIS — I5042 Chronic combined systolic (congestive) and diastolic (congestive) heart failure: Secondary | ICD-10-CM

## 2017-07-29 DIAGNOSIS — I82621 Acute embolism and thrombosis of deep veins of right upper extremity: Secondary | ICD-10-CM

## 2017-07-29 DIAGNOSIS — Z955 Presence of coronary angioplasty implant and graft: Secondary | ICD-10-CM | POA: Diagnosis not present

## 2017-07-29 DIAGNOSIS — I255 Ischemic cardiomyopathy: Secondary | ICD-10-CM | POA: Diagnosis not present

## 2017-07-29 DIAGNOSIS — Z86718 Personal history of other venous thrombosis and embolism: Secondary | ICD-10-CM | POA: Insufficient documentation

## 2017-07-29 DIAGNOSIS — I2510 Atherosclerotic heart disease of native coronary artery without angina pectoris: Secondary | ICD-10-CM | POA: Diagnosis not present

## 2017-07-29 DIAGNOSIS — I5022 Chronic systolic (congestive) heart failure: Secondary | ICD-10-CM | POA: Diagnosis not present

## 2017-07-29 DIAGNOSIS — J9611 Chronic respiratory failure with hypoxia: Secondary | ICD-10-CM | POA: Diagnosis not present

## 2017-07-29 MED ORDER — BISOPROLOL FUMARATE 5 MG PO TABS: 2.5000 mg | ORAL_TABLET | Freq: Every day | ORAL | 6 refills | Status: DC

## 2017-07-29 NOTE — Progress Notes (Signed)
PCP: Primary HF Cardiologist: Dr Aundra Dubin  Pulmonologist: Dr Melvyn Novas.   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   She had a recent 75 day-long admission (10/31-12/1) for acute hypoxic respiratory failure secondary to acute decompensated HF which was a new diagnosis at the time. She required intubation x2 and hospital course was complicated by MSSA pneumonia and RUE DVT.  LHC 11/21 with 95% stenosis of mid RCA and s/p DES. She was discharged to SNF on Entresto 49/51, Lasix 40 QD, spironolactone 25 QD, Plavix and Eliquis 5 BID. Weight 219 pounds at time of discharge.    Admitted 12/12 from SNF with presyncope and hypotension. HF meds held. Received IV fluids and antibiotics.   Today she returns for post hospital follow up. Overall feeling fine. Denies SOB/PND/Orthopnea. No chest pain. No bleeding problems. Appetite ok. No fever or chills. Weight at home 239>231 pounds. Heart rate at home as been in the 90s. SBP 120s.  Taking all medications. Followed by Alvis Lemmings for [facility]. Has an aide and gets PT 2x per week. Ambulating more at home.   05/2017 LHC with DES to RCA(95% mid RCA stenosis).     06/14/17 Upper Extremity US-with non-occlusive DVT in right axillary vein. Noted around PICC line in right basilic vein, and short segment of SVT noted in the forearm within the cephalic vein. LUE negative  05/26/2017 ECHO EF 35-40% Grade I DD   ROS: All systems negative except as listed in HPI, PMH and Problem List.  SH:  Social History   Socioeconomic History  . Marital status: Married    Spouse name: Not on file  . Number of children: Not on file  . Years of education: Not on file  . Highest education level: Not on file  Social Needs  . Financial resource strain: Not on file  . Food insecurity - worry: Not  on file  . Food insecurity - inability: Not on file  . Transportation needs - medical: Not on file  . Transportation needs - non-medical: Not on file  Occupational History  . Not on file  Tobacco Use  . Smoking status: Former Smoker    Packs/day: 1.00    Years: 25.00    Pack years: 25.00    Types: Cigarettes    Last attempt to quit: 07/27/1982    Years since quitting: 35.0  . Smokeless tobacco: Never Used  Substance and Sexual Activity  . Alcohol use: Yes    Alcohol/week: 0.0 oz    Comment: wine-rare  . Drug use: No  . Sexual activity: Not Currently  Other Topics Concern  . Not on file  Social History Narrative  . Not on file    FH:  Family History  Problem Relation Age of Onset  . Stroke Mother   . Diabetes Mother 49  . Stroke Father   . Heart failure Father   . Breast cancer Maternal Grandmother   . Colon cancer Neg Hx     Past Medical History:  Diagnosis Date  . Allergy    allergic rhinitis  . Asthma   . Chronic bronchitis ([place])   . Coronary artery disease    cath January 2011 with DEs LAD and RCA  . Dyspnea   . Full dentures   . Gallstones   .  GERD (gastroesophageal reflux disease)   . History of echocardiogram    Echo 5/17: mod LVH, EF 50-55%, ant-septal HK, Gr 1 DD, mod LAE //  b.  Echo 7/17: mild concentric LVH, EF 45-50%, inf-lat, inf, inf-septal HK, mild LAE  . History of nuclear stress test    Myoview 7/17: EF 48%, small mild apical defect, no ischemia, low risk  . Hyperlipidemia   . Hypertension   . Myocardial infarction ([place])    subendocardial, initial episode, 2010 two stents placed  . Myopia   . 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 Nocturnal oxygen desaturation    o2 at night  . Obesity   . Osteoarthritis    knees, fingers, shoulders  . Overactive bladder   . Oxygen deficiency    uses oxygen all day  . Rash    and other non specific skin eruptions  . Retaining fluid    in ankles and feet  . Urinary  incontinence   . Vertigo   . Wears glasses     Current Outpatient Medications  Medication Sig Dispense Refill  . acetaminophen (TYLENOL) 325 MG tablet Take 650 mg by mouth every 6 (six) hours as needed for mild pain. Take as needed per bottle     . albuterol (PROAIR HFA) 108 (90 Base) MCG/ACT inhaler INHALE 2 PUFFS BY MOUTH EVERY 4 HOURS AS NEEDED FOR WHEEZING OR FOR SHORTNESS OF BREATH 8 Inhaler 5  . apixaban (ELIQUIS) 5 MG TABS tablet Take 1 tablet (5 mg total) by mouth 2 (two) times daily. 60 tablet 0  . clopidogrel (PLAVIX) 75 MG tablet Take 1 tablet (75 mg total) by mouth daily. 30 tablet 0  . cyanocobalamin (,VITAMIN B-12,) 1000 MCG/ML injection Inject 1,000 mcg into the muscle every 3 (three) months.    . EPIPEN 2-PAK 0.3 MG/0.3ML SOAJ injection INJECT 0.3 MLS (0.3 MG TOTAL) INTO THE MUSCLE ONCE AS NEEDED FOR ALLERGIC REACTION 2 Device 0  . esomeprazole (NEXIUM) 40 MG capsule Take 1 capsule (40 mg total) by mouth daily. 30 capsule 0  . ezetimibe (ZETIA) 10 MG tablet TAKE 1 TABLET (10 MG TOTAL) BY MOUTH DAILY. 30 tablet 0  . furosemide (LASIX) 20 MG tablet Take 1 tablet (20 mg total) by mouth daily. 39 tablet 9  . insulin glargine (LANTUS) 100 UNIT/ML injection Inject 0.22 mLs (22 Units total) into the skin daily. 10 mL 1  . Maltodextrin-Xanthan Gum (RESOURCE THICKENUP CLEAR) POWD Oral, As needed, other    . mineral oil liquid Place in ear(s) once a week. 1 drop of mineral oil each ear once weekly to soften ear wax.    . montelukast (SINGULAIR) 10 MG tablet Take 1 tablet (10 mg total) by mouth at bedtime. 30 tablet 0  . Multiple Vitamin (MULTIVITAMIN) capsule Take 1 capsule by mouth daily. 30 capsule 0  . OXYGEN Inhale 3 L into the lungs continuous. 24/7 2 lpm  [employer]    . pravastatin (PRAVACHOL) 40 MG tablet Take 1 tablet (40 mg total) by mouth daily at 6 PM. 30 tablet 0  . predniSONE (DELTASONE) 10 MG tablet Take 1 tablet (10 mg total) by mouth daily with breakfast. 30 tablet 0  .  ranitidine (ZANTAC) 75 MG tablet Take 4 tablets (300 mg total) by mouth at bedtime. 30 tablet 0  . sacubitril-valsartan (ENTRESTO) 49-51 MG Take 1 tablet by mouth 2 (two) times daily. 60 tablet 0  . spironolactone (ALDACTONE) 25 MG  tablet Take 0.5 tablets (12.5 mg total) by mouth daily. 30 tablet 0   No current facility-administered medications for this encounter.     Vitals:   07/29/17 1201  BP: 120/68  Pulse: (!) 104  SpO2: 95%  Weight: 235 lb 6.4 oz (106.8 kg)    PHYSICAL EXAM: General:  Appears chronically ill. No resp difficulty. Arrived in wheel chair HEENT: normal Neck: supple. JVP hard to assess due to body habitus does not seem elevated.  Carotids 2+ bilaterally; no bruits. No lymphadenopathy or thryomegaly appreciated. Cor: PMI normal. Regular rate & rhythm. No rubs, gallops or murmurs. Lungs: clear on 2 liters.  Abdomen: obese,  soft, nontender, nondistended. No hepatosplenomegaly. No bruits or masses. Good bowel sounds. Extremities: no cyanosis, clubbing, rash, edema Neuro: alert & orientedx3, cranial nerves grossly intact. Moves all 4 extremities w/o difficulty. Affect pleasant.  EKG: NSR 100 bpm   ASSESSMENT & PLAN: 1. Chronic systolic CHF: Ischemic cardiomyopathy.  Echo 10/18 with EF 35%.  Plan to repeat ECHO after HF meds optimized.  NYHA III chronically. Functionally doing well. Volume status stable. Continue lasix 20 mg daily. - Continue 12.5 mg spironolactone daily.  - Continue 24-26 mg entresto twice a day.  - Add 2.5 mg bisoprolol. Watch for bradycardia. I have instructed her husband to stop bisoprolol if heart rate is < 60 and to call the clinic.  - BMET next visit.  2. CAD: Most recently had DES to [facility] in 11/18.   No s/s ischemia  -She has been on the combination of Plavix and Eliquis at home (had RUE DVT last hospitalization).  - Continue Plavix.  - Continue statin.  3 H/O AKI: Check BMET today. 4. 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  5. RUE  DVT: In 11/18, related to PICC.  Plan to continue Eliquis x 3 months. (September 13, 2017). No bleeding problems.   Follow up 4 weeks with Dr Aundra Dubin. Check EKG at that time.   Greater than 50% of the (total minutes 25) visit spent in counseling/coordination of care regarding the above with medications changes discussed.    Amy Clegg NP-C  1:32 PM

## 2017-07-29 NOTE — Patient Instructions (Addendum)
START Bisoprolol 2.5 mg (1/2 tablet) once daily.  Follow up 4-6 weeks with Dr. Aundra Dubin.  Take all medication as prescribed the day of your appointment. Bring all medications with you to your appointment.  Do the following things EVERYDAY: 1) Weigh yourself in the morning before breakfast. Write it down and keep it in a log. 2) Take your medicines as prescribed 3) Eat low salt foods-Limit salt (sodium) to 2000 mg per day.  4) Stay as active as you can everyday 5) Limit all fluids for the day to less than 2 liters

## 2017-07-31 DIAGNOSIS — I5042 Chronic combined systolic (congestive) and diastolic (congestive) heart failure: Secondary | ICD-10-CM | POA: Diagnosis not present

## 2017-07-31 DIAGNOSIS — I110 Hypertensive heart disease with heart failure: Secondary | ICD-10-CM | POA: Diagnosis not present

## 2017-07-31 DIAGNOSIS — E119 Type 2 diabetes mellitus without complications: Secondary | ICD-10-CM | POA: Diagnosis not present

## 2017-07-31 DIAGNOSIS — M1991 Primary osteoarthritis, unspecified site: Secondary | ICD-10-CM | POA: Diagnosis not present

## 2017-07-31 DIAGNOSIS — I2510 Atherosclerotic heart disease of native coronary artery without angina pectoris: Secondary | ICD-10-CM | POA: Diagnosis not present

## 2017-08-02 DIAGNOSIS — I5042 Chronic combined systolic (congestive) and diastolic (congestive) heart failure: Secondary | ICD-10-CM | POA: Diagnosis not present

## 2017-08-02 DIAGNOSIS — M1991 Primary osteoarthritis, unspecified site: Secondary | ICD-10-CM | POA: Diagnosis not present

## 2017-08-02 DIAGNOSIS — E119 Type 2 diabetes mellitus without complications: Secondary | ICD-10-CM | POA: Diagnosis not present

## 2017-08-02 DIAGNOSIS — I2510 Atherosclerotic heart disease of native coronary artery without angina pectoris: Secondary | ICD-10-CM | POA: Diagnosis not present

## 2017-08-02 DIAGNOSIS — I110 Hypertensive heart disease with heart failure: Secondary | ICD-10-CM | POA: Diagnosis not present

## 2017-08-03 DIAGNOSIS — Z7902 Long term (current) use of antithrombotics/antiplatelets: Secondary | ICD-10-CM

## 2017-08-03 DIAGNOSIS — Z7952 Long term (current) use of systemic steroids: Secondary | ICD-10-CM

## 2017-08-03 DIAGNOSIS — I82621 Acute embolism and thrombosis of deep veins of right upper extremity: Secondary | ICD-10-CM | POA: Diagnosis not present

## 2017-08-03 DIAGNOSIS — Z8701 Personal history of pneumonia (recurrent): Secondary | ICD-10-CM

## 2017-08-03 DIAGNOSIS — Z794 Long term (current) use of insulin: Secondary | ICD-10-CM

## 2017-08-03 DIAGNOSIS — Z87440 Personal history of urinary (tract) infections: Secondary | ICD-10-CM

## 2017-08-03 DIAGNOSIS — Z87891 Personal history of nicotine dependence: Secondary | ICD-10-CM

## 2017-08-03 DIAGNOSIS — N289 Disorder of kidney and ureter, unspecified: Secondary | ICD-10-CM | POA: Diagnosis not present

## 2017-08-03 DIAGNOSIS — I252 Old myocardial infarction: Secondary | ICD-10-CM

## 2017-08-03 DIAGNOSIS — I2510 Atherosclerotic heart disease of native coronary artery without angina pectoris: Secondary | ICD-10-CM | POA: Diagnosis not present

## 2017-08-03 DIAGNOSIS — I110 Hypertensive heart disease with heart failure: Secondary | ICD-10-CM | POA: Diagnosis not present

## 2017-08-03 DIAGNOSIS — M1991 Primary osteoarthritis, unspecified site: Secondary | ICD-10-CM | POA: Diagnosis not present

## 2017-08-03 DIAGNOSIS — E119 Type 2 diabetes mellitus without complications: Secondary | ICD-10-CM | POA: Diagnosis not present

## 2017-08-03 DIAGNOSIS — Z9981 Dependence on supplemental oxygen: Secondary | ICD-10-CM

## 2017-08-03 DIAGNOSIS — J449 Chronic obstructive pulmonary disease, unspecified: Secondary | ICD-10-CM | POA: Diagnosis not present

## 2017-08-03 DIAGNOSIS — J9611 Chronic respiratory failure with hypoxia: Secondary | ICD-10-CM | POA: Diagnosis not present

## 2017-08-03 DIAGNOSIS — M8580 Other specified disorders of bone density and structure, unspecified site: Secondary | ICD-10-CM | POA: Diagnosis not present

## 2017-08-03 DIAGNOSIS — F329 Major depressive disorder, single episode, unspecified: Secondary | ICD-10-CM

## 2017-08-03 DIAGNOSIS — J9612 Chronic respiratory failure with hypercapnia: Secondary | ICD-10-CM | POA: Diagnosis not present

## 2017-08-03 DIAGNOSIS — I5042 Chronic combined systolic (congestive) and diastolic (congestive) heart failure: Secondary | ICD-10-CM | POA: Diagnosis not present

## 2017-08-03 DIAGNOSIS — E538 Deficiency of other specified B group vitamins: Secondary | ICD-10-CM | POA: Diagnosis not present

## 2017-08-05 ENCOUNTER — Other Ambulatory Visit: Payer: Self-pay | Admitting: Family Medicine

## 2017-08-05 DIAGNOSIS — I5042 Chronic combined systolic (congestive) and diastolic (congestive) heart failure: Secondary | ICD-10-CM | POA: Diagnosis not present

## 2017-08-05 DIAGNOSIS — E119 Type 2 diabetes mellitus without complications: Secondary | ICD-10-CM | POA: Diagnosis not present

## 2017-08-05 DIAGNOSIS — I2510 Atherosclerotic heart disease of native coronary artery without angina pectoris: Secondary | ICD-10-CM | POA: Diagnosis not present

## 2017-08-05 DIAGNOSIS — M1991 Primary osteoarthritis, unspecified site: Secondary | ICD-10-CM | POA: Diagnosis not present

## 2017-08-05 DIAGNOSIS — I110 Hypertensive heart disease with heart failure: Secondary | ICD-10-CM | POA: Diagnosis not present

## 2017-08-06 NOTE — Telephone Encounter (Signed)
I checked in with her cardiology provider and she is to stay on both short term  Looks like she will shop the eliquis o 2/18 per the message below     Reklaw, Malachi Bonds, NP  Tower, Wynelle Fanny, MD         Yes please.  She will stop eliquis after 3 months for acute DVT and continue plavix .   We have follow up on 2/1 and will stop 2/18.      So I refilled the eliquis for just a month (depending on how many pills she has left now she may need a few more to get by to the 18th from there - she can let us know  She will see the cardiologist anyway on 2/1 Thanks

## 2017-08-06 NOTE — Telephone Encounter (Signed)
?   If Dr. Glori Bickers is the prescribing doc and if pt is suppose to be on both blood thinners, please advise

## 2017-08-09 ENCOUNTER — Other Ambulatory Visit: Payer: Self-pay | Admitting: Family Medicine

## 2017-08-09 DIAGNOSIS — I110 Hypertensive heart disease with heart failure: Secondary | ICD-10-CM | POA: Diagnosis not present

## 2017-08-09 DIAGNOSIS — I2510 Atherosclerotic heart disease of native coronary artery without angina pectoris: Secondary | ICD-10-CM | POA: Diagnosis not present

## 2017-08-09 DIAGNOSIS — M1991 Primary osteoarthritis, unspecified site: Secondary | ICD-10-CM | POA: Diagnosis not present

## 2017-08-09 DIAGNOSIS — E119 Type 2 diabetes mellitus without complications: Secondary | ICD-10-CM | POA: Diagnosis not present

## 2017-08-09 DIAGNOSIS — I5042 Chronic combined systolic (congestive) and diastolic (congestive) heart failure: Secondary | ICD-10-CM | POA: Diagnosis not present

## 2017-08-09 NOTE — Telephone Encounter (Signed)
Dr. Glori Bickers hasn't prescribed this med before, please advise

## 2017-08-09 NOTE — Telephone Encounter (Signed)
I can take that over Will refill electronically

## 2017-08-13 ENCOUNTER — Other Ambulatory Visit: Payer: Self-pay | Admitting: Family Medicine

## 2017-08-13 DIAGNOSIS — I110 Hypertensive heart disease with heart failure: Secondary | ICD-10-CM | POA: Diagnosis not present

## 2017-08-13 DIAGNOSIS — I5042 Chronic combined systolic (congestive) and diastolic (congestive) heart failure: Secondary | ICD-10-CM | POA: Diagnosis not present

## 2017-08-13 DIAGNOSIS — I2510 Atherosclerotic heart disease of native coronary artery without angina pectoris: Secondary | ICD-10-CM | POA: Diagnosis not present

## 2017-08-13 DIAGNOSIS — E119 Type 2 diabetes mellitus without complications: Secondary | ICD-10-CM | POA: Diagnosis not present

## 2017-08-13 DIAGNOSIS — M1991 Primary osteoarthritis, unspecified site: Secondary | ICD-10-CM | POA: Diagnosis not present

## 2017-08-13 MED ORDER — PRAVASTATIN SODIUM 40 MG PO TABS: ORAL_TABLET | ORAL | 11 refills | Status: DC

## 2017-08-13 NOTE — Addendum Note (Signed)
Addended by: Tammi Sou on: 08/13/2017 05:40 PM   Modules accepted: Orders

## 2017-08-14 DIAGNOSIS — M1991 Primary osteoarthritis, unspecified site: Secondary | ICD-10-CM | POA: Diagnosis not present

## 2017-08-14 DIAGNOSIS — I110 Hypertensive heart disease with heart failure: Secondary | ICD-10-CM | POA: Diagnosis not present

## 2017-08-14 DIAGNOSIS — I2510 Atherosclerotic heart disease of native coronary artery without angina pectoris: Secondary | ICD-10-CM | POA: Diagnosis not present

## 2017-08-14 DIAGNOSIS — E119 Type 2 diabetes mellitus without complications: Secondary | ICD-10-CM | POA: Diagnosis not present

## 2017-08-14 DIAGNOSIS — I5042 Chronic combined systolic (congestive) and diastolic (congestive) heart failure: Secondary | ICD-10-CM | POA: Diagnosis not present

## 2017-08-15 DIAGNOSIS — R0902 Hypoxemia: Secondary | ICD-10-CM | POA: Diagnosis not present

## 2017-08-16 ENCOUNTER — Other Ambulatory Visit (INDEPENDENT_AMBULATORY_CARE_PROVIDER_SITE_OTHER): Payer: Medicare Other

## 2017-08-16 ENCOUNTER — Encounter: Payer: Self-pay | Admitting: Internal Medicine

## 2017-08-16 ENCOUNTER — Ambulatory Visit: Payer: Medicare Other | Admitting: Internal Medicine

## 2017-08-16 VITALS — BP 120/66 | HR 93 | Ht 64.75 in | Wt 231.0 lb

## 2017-08-16 DIAGNOSIS — J45991 Cough variant asthma: Secondary | ICD-10-CM | POA: Diagnosis not present

## 2017-08-16 DIAGNOSIS — R0609 Other forms of dyspnea: Secondary | ICD-10-CM

## 2017-08-16 DIAGNOSIS — J9611 Chronic respiratory failure with hypoxia: Secondary | ICD-10-CM | POA: Diagnosis not present

## 2017-08-16 DIAGNOSIS — R918 Other nonspecific abnormal finding of lung field: Secondary | ICD-10-CM

## 2017-08-16 DIAGNOSIS — J9612 Chronic respiratory failure with hypercapnia: Secondary | ICD-10-CM

## 2017-08-16 LAB — BASIC METABOLIC PANEL
BUN: 26 mg/dL — ABNORMAL HIGH (ref 6–23)
CALCIUM: 9.8 mg/dL (ref 8.4–10.5)
CO2: 35 mEq/L — ABNORMAL HIGH (ref 19–32)
CREATININE: 1.1 mg/dL (ref 0.40–1.20)
Chloride: 97 mEq/L (ref 96–112)
GFR: 51.12 mL/min — ABNORMAL LOW (ref >60.00)
Glucose, Bld: 148 mg/dL — ABNORMAL HIGH (ref 70–99)
Potassium: 5.1 mEq/L (ref 3.5–5.1)
Sodium: 139 mEq/L (ref 135–145)

## 2017-08-16 LAB — CBC WITH DIFFERENTIAL/PLATELET
Basophils Absolute: 0.2 10*3/uL — ABNORMAL HIGH (ref 0.0–0.1)
Basophils Relative: 1.1 % (ref 0.0–3.0)
Eosinophils Absolute: 0.1 10*3/uL (ref 0.0–0.7)
Eosinophils Relative: 0.7 % (ref 0.0–5.0)
HCT: 40.4 % (ref 36.0–46.0)
Hemoglobin: 13.2 g/dL (ref 12.0–15.0)
Lymphs Abs: 1.3 10*3/uL (ref 0.7–4.0)
MCHC: 32.5 g/dL (ref 30.0–36.0)
MCV: 95.9 fl (ref 78.0–100.0)
MONOS PCT: 2.8 % — AB (ref 3.0–12.0)
Monocytes Absolute: 0.5 10*3/uL (ref 0.1–1.0)
NEUTROS ABS: 15.9 10*3/uL — AB (ref 1.4–7.7)
Platelets: 306 10*3/uL (ref 150.0–400.0)
RBC: 4.21 Mil/uL (ref 3.87–5.11)
RDW: 17 % — AB (ref 11.5–15.5)
WBC: 17.9 10*3/uL — ABNORMAL HIGH (ref 4.0–10.5)

## 2017-08-16 LAB — SEDIMENTATION RATE: Sed Rate: 54 mm/hr — ABNORMAL HIGH (ref 0–30)

## 2017-08-16 MED ORDER — PREDNISONE 10 MG PO TABS: 10.0000 mg | ORAL_TABLET | Freq: Every day | ORAL | 0 refills | Status: DC

## 2017-08-16 NOTE — Assessment & Plan Note (Signed)
Echo 05/26/17 Compared to a prior study in 2017, the LVEF is lower at 35-40%   with global hypokinesis, grade 1 DD and elevated LV filling   pressure.  Clearly multifactorial with obesity and chf >> boop /airways dx> appears euvolemic / well compensated

## 2017-08-16 NOTE — Patient Instructions (Addendum)
Try prednisone 10 mg on even days and 5 mg on odd if tolerated   Please remember to go to the lab department downstairs in the basement  for your tests - we will call you with the results when they are available.      Please schedule a follow up visit in 3 months but call sooner if needed

## 2017-08-16 NOTE — Progress Notes (Signed)
Subjective:   Patient ID: Kelli Velasquez, female    DOB: 15-Nov-1940   MRN: [id number]   Brief patient profile:  8  yowf quit smoking in 1984 at wt 160- 180   with h/o CAD, HTN, OA and obesity presents for an initial pulmonary consult for cough x 6 weeks and recurrent bronchitis flares x 1 year ~10/2011 referred by Tower with nodular lung dz ? Etiology c/w BOOP clinically.    History of Present Illness  07/03/2013 f/u ov/Kit Brubacher re: sob/cough/ wheeze/ MPN Chief Complaint  Patient presents with  . Follow-up    Pt had PET scan done this am. Her cough and SOB have improved since the last visit.   saba inhaler helps some/ min mucoid sputum, temp to 99 but no rigors, no purulent sputum or hemoptysis/ does have some wt. Loss, no sweating.  rec Dulera 100 Take 2 puffs first thing in am and then another 2 puffs about 12 hours later.  Prednisone 10 mg take  4 each am x 2 days,   2 each am x 2 days,  1 each am x 2 days and stop Only use your albuterol as a rescue medication  We will arrange 24/7 02 at 3lpm per advanced  We will call to arrange a biopsy of the easiest area once I discuss this with radiology  Late add: Discussed in detail all the  indications, usual  risks and alternatives  relative to the benefits with patient who agrees to proceed with bronchoscopy with biopsy.   FOB > done 07/05/2013 > nl airways > neg afb/fungus/path  - IR Bx 07/11/2013 > inflammatory, special stains pending but does not look like tb or fungus and responds short course pred per pt > rec prednisone x 6 days only and needs VATS bx but declines as of 07/13/2013 > rec'd final path from [place] review > c/w BOOP though can't exclude asp/infection sources        11/20/2016 acute extended ov/Alandra Sando re:  BOOP/ pred 20/15 alternating and 2lpm 24/7 and increased to 3lpm to control Chief Complaint  Patient presents with  . Acute Visit    Pt c/o increased SOB, cough, and fatigue for the past 1-2 wks. Her cough is non prod.  She states she wakes up in the am very tired.    gradually sob /cough worse p eating / eats 10 pm, no candy handy and having lots of throat clearing/ sensation of daytime pnds / no better with saba  Doe now = MMRC3 = can't walk 100 yards even at a slow pace at a flat grade s stopping due to sob   rec Please remember to go to the lab and x-ray department downstairs in the basement  for your tests - we will call you with the results when they are available.  For drainage / throat tickle try take CHLORPHENIRAMINE  4 mg - take one every 4 hours as needed GERD  Keep your previous appt  Add :  Consider challenging with symbicort to see if helps cough/ systemic steroid dep    01/11/2017  f/u ov/Ambrie Carte re:  BOOP/ steroid dep  -  No med calendar  Chief Complaint  Patient presents with  . Follow-up    3 month follow up. Patient states she is not feeling well. Feeling congested. Coughing. States she is not able to walk 65ft on 2L of o2 before feeling out of breath.   gradually tapered pred from 20/15  Started feeling worse when reduced  pred  to 5 mg daily x 3 weeks prior to OV  And weak since. Bad chills x 2 x  1-2 days prior to OV  But s excess/ purulent sputum or mucus plugs  And none in last 24h rec Please remember to go to the lab and x-ray department downstairs in the basement  for your tests - we will call you with the results when they are available. Prednisone 10 mg daily until better or otherwise notified and then when better 10/5 alternating days     Admit date: 07/07/2017 Discharge date: 07/10/2017  Recommendations for Outpatient Follow-up:  Please note that lasix dose is now 20 mg a day due to soft blood pressure in hospital. Follow up with cardio per sch appt to make sure this dose is tolerated. Aldactone is 12.5 mg a day. Continue Cipro for 4 days on discharge for UTI.   Discharge Diagnoses:    Near syncope   Acute kidney injury [facility])   Coronary artery disease    Hypertension   Acute on chronic respiratory failure with hypoxia (HCC)   Chronic systolic heart failure (H[facility])   Abnormal urinalysis   Dysphagia   Acute deep vein thrombosis (DVT) of right upper extremity (HCC)   Diabetes mellitus type 2 in obese (HCC)   Acute metabolic encephalopathy   Gluteal cleft wound    08/16/2017  Extended post hosp transition of care  f/u ov/Tanna Loeffler re:  Re-establish re BOOP steroid dep/ pred 10 mg daily plus new dx ischemic chf  Chief Complaint  Patient presents with  . Hospitalization Follow-up    Breathing is worse today which she relates to colder weather. She has not had to use her rescue inhaler.   doe = MMRC3 = can't walk 100 yards even at a slow pace at a flat grade s stopping due to sob  On up to 3lpm  Sleeping fine/ hoarse but not coughing.  No obvious day to day or daytime variability or assoc excess/ purulent sputum or mucus plugs or hemoptysis or cp or chest tightness, subjective wheeze or overt sinus or hb symptoms. No unusual exposure hx or h/o childhood pna/ asthma or knowledge of premature birth.  Sleeping ok on 2lpm /2 pillows without nocturnal  or early am exacerbation  of respiratory  c/o's or need for noct saba. Also denies any obvious fluctuation of symptoms with weather or environmental changes or other aggravating or alleviating factors except as outlined above   Current Allergies, Complete Past Medical History, Past Surgical History, Family History, and Social History were reviewed in [employer] record.  ROS  The following are not active complaints unless bolded Hoarseness, sore throat, dysphagia, dental problems, itching, sneezing,  nasal congestion or discharge of excess mucus or purulent secretions, ear ache,   fever, chills, sweats, unintended wt loss or wt gain, classically pleuritic or exertional cp,  orthopnea pnd or leg swelling, presyncope, palpitations, abdominal pain, anorexia, nausea, vomiting, diarrhea  or  change in bowel habits or change in bladder habits, change in stools or change in urine, dysuria, hematuria,  rash, arthralgias, visual complaints, headache, numbness, weakness or ataxia or problems with walking or coordination,  change in mood/affect or memory.        Current Meds  Medication Sig  . acetaminophen (TYLENOL) 325 MG tablet Take 650 mg by mouth every 6 (six) hours as needed for mild pain. Take as needed per bottle   . albuterol (PROAIR HFA) 108 (90 Base) MCG/ACT inhaler  INHALE 2 PUFFS BY MOUTH EVERY 4 HOURS AS NEEDED FOR WHEEZING OR FOR SHORTNESS OF BREATH  . bisoprolol (ZEBETA) 5 MG tablet Take 0.5 tablets (2.5 mg total) by mouth daily.  . chlorpheniramine (CHLOR-TRIMETON) 4 MG tablet Take 4 mg by mouth 2 (two) times daily as needed for allergies.  Marland Kitchen clopidogrel (PLAVIX) 75 MG tablet TAKE 1 TABLET BY MOUTH EVERY DAY  . cyanocobalamin (,VITAMIN B-12,) 1000 MCG/ML injection Inject 1,000 mcg into the muscle every 3 (three) months.  Marland Kitchen ELIQUIS 5 MG TABS tablet TAKE 1 TABLET BY MOUTH TWICE A DAY  . ENTRESTO 49-51 MG TAKE 1 TABLET BY MOUTH TWICE A DAY  . EPIPEN 2-PAK 0.3 MG/0.3ML SOAJ injection INJECT 0.3 MLS (0.3 MG TOTAL) INTO THE MUSCLE ONCE AS NEEDED FOR ALLERGIC REACTION  . esomeprazole (NEXIUM) 40 MG capsule Take 1 capsule (40 mg total) by mouth daily.  Marland Kitchen ezetimibe (ZETIA) 10 MG tablet TAKE 1 TABLET (10 MG TOTAL) BY MOUTH DAILY.  . furosemide (LASIX) 20 MG tablet Take 1 tablet (20 mg total) by mouth daily.  . insulin glargine (LANTUS) 100 UNIT/ML injection Inject 0.22 mLs (22 Units total) into the skin daily.  . Maltodextrin-Xanthan Gum (RESOURCE THICKENUP CLEAR) POWD Oral, As needed, other  . montelukast (SINGULAIR) 10 MG tablet Take 1 tablet (10 mg total) by mouth at bedtime.  . Multiple Vitamin (MULTIVITAMIN) capsule Take 1 capsule by mouth daily.  . OXYGEN 24/7 2 lpm  [employer]  . pravastatin (PRAVACHOL) 40 MG tablet TAKE 1 TABLET BY MOUTH EVERY DAY AT 6PM  . predniSONE  (DELTASONE) 10 MG tablet Take 1 tablet (10 mg total) by mouth daily with breakfast.  . ranitidine (ZANTAC) 75 MG tablet Take 4 tablets (300 mg total) by mouth at bedtime.  Marland Kitchen spironolactone (ALDACTONE) 25 MG tablet Take 0.5 tablets (12.5 mg total) by mouth daily.  . [DISCONTINUED] predniSONE (DELTASONE) 10 MG tablet Take 1 tablet (10 mg total) by mouth daily with breakfast.                  Objective:   Physical Exam 01/31/2013   237  > 07/03/2013  232 > 07/24/2013  231 > 09/06/2013  233 > 04/18/2014 256  10/18/2013  233 > 11/29/2013 238 >  03/07/2014  249 > 05/17/2014  263 > 06/26/2014 261>   10/01/2014  247 > 01/02/2015  253 >  04/16/2015 247 >  04/30/2015  244 > 06/14/2015   246 > 09/23/2015   255 > 11/04/2015 253  >  05/07/2016 259 > 06/23/2016   265  > 09/28/2016   247 > 11/20/2016  255 > 08/16/2017  231      amb hoarse wf nad  Vital signs reviewed - Note on arrival 02 sats  95% on 2lpm   Pulsed    HEENT: nl dentition, turbinates bilaterally, and oropharynx. Nl external ear canals without cough reflex   NECK :  without JVD/Nodes/TM/ nl carotid upstrokes bilaterally   LUNGS: no acc muscle use,  Nl contour chest which is clear to A and P bilaterally without cough on insp or exp maneuvers   CV:  RRR  no s3 or murmur or increase in P2, and no edema   ABD:  Obese soft and nontender with very limited  inspiratory excursion in the supine position. No bruits or organomegaly appreciated, bowel sounds nl  MS:  Nl gait/ ext warm without deformities, calf tenderness, cyanosis or clubbing No obvious joint restrictions   SKIN: warm and  dry without lesions    NEURO:  alert, approp, nl sensorium with  no motor or cerebellar deficits apparent.    labs drawn/ reviewed 08/16/2017    Chemistry      Component Value Date/Time   NA 139 08/16/2017 1519   K 5.1 08/16/2017 1519   CL 97 08/16/2017 1519   CO2 35 (H) 08/16/2017 1519   BUN 26 (H) 08/16/2017 1519   CREATININE 1.10 08/16/2017 1519   CREATININE  1.01 (H) 12/16/2015 1142         Lab Results  Component Value Date   ESRSEDRATE 54 (H) 08/16/2017   ESRSEDRATE 40 (H) 06/18/2017   ESRSEDRATE 45 (H) 01/11/2017       I personally reviewed images and agree with radiology impression as follows:  CXR:   07/07/17 Chronically low lung volumes with bibasilar atelectasis. No acute cardiopulmonary abnormality.

## 2017-08-16 NOTE — Assessment & Plan Note (Addendum)
11/24/2012 Office walk showed desats 86% on RA  With  spirometry FEV1 nml-73% , ratio 80   11/25/2012 CTA Chest >>neg for PE/ ILD  12/07/12 ONO > Pos 5: 61min  begin O2 2 l/m At bedtime  12/13/2012  >  Repeat 02/01/13 on 2lpm 65min> no change rx  - 01/31/2013  Walked RA x 3 laps @ 185 ft each stopped due to  End of study, no desats  - 07/03/2013 sats 75% RA >  On 3lpm sats 93%  > rx with 24 h 02 at 3lpm   - improved by self monitoring as of 07/24/13 ov so changed rx to 2lpm/24/7 humidified due to nasal dryness - 09/06/2013  Walked 2lpm x  2 laps @ 185 ft each stopped due to legs tired, no desats    - 04/18/2014  Walked RA  @ mod pace x 2 laps @ 185 ft each stopped due to  Hip pain, slow pace, sats 89% at end  - 05/16/2014  [facility] RA  2 laps @ 185 ft each stopped due to sob/fatigue but no desats off pred x 04/10/14  - 04/16/2015  Walked RA x 3 laps @ 185 ft each stopped due to   - ONO RA   10/05/2014 >  02 sat < 89% 213 min so rec resume 2lpm and work on wt loss   - 04/16/2015  Walked RA x 3 laps @ 185 ft each stopped due to  91% End of study, nl pace, no sob or desat - HCO3  01/06/16 = 35    - HCO3  05/07/2016  = 39 assoc with wt gain  - HCO3 01/11/2017     = 40  - HCO3 08/16/2017     = 35  As of  08/16/2017    02 2lpm sleeping, resting/  3lpm with activity

## 2017-08-16 NOTE — Assessment & Plan Note (Signed)
-   PFT's 01/31/2013 wnl x low fef25-75 and 12% better fev1 p B2 - 06/26/2014 p extensive coaching HFA effectiveness =    75% . Start qvar 80 2bid  - trial of qvar 80 1 bid 10/01/14 > improved 01/02/15 > flared on low dose qvar 02/2015 > back on qvar 80 2bid rec  04/16/2015 > improved 04/30/2015  - added flutter 09/23/2015 >>>   - Singulair rx 10/23/15 def decrease in albuterol need/ decrease wheeze and improved sob > no better on flovent 02/04/2016 so d/c'd flovent - Allergy profile 11/20/2016 >  Eos 0.5/  IgE  109  Pos Dust > cockroach   Despite addition of entresto > Adequate control on present rx, reviewed in detail with pt > no change in rx needed

## 2017-08-17 ENCOUNTER — Encounter: Payer: Self-pay | Admitting: Internal Medicine

## 2017-08-17 NOTE — Assessment & Plan Note (Signed)
Body mass index is 38.74 kg/m.  -  trending down/ encouraged Lab Results  Component Value Date   TSH 1.452 06/15/2017     Contributing to gerd risk/ doe/reviewed the need and the process to achieve and maintain neg calorie balance > defer f/u primary care including intermittently monitoring thyroid status

## 2017-08-17 NOTE — Progress Notes (Signed)
Spoke with pt and notified of results per Dr. Wert. Pt verbalized understanding and denied any questions. 

## 2017-08-17 NOTE — Assessment & Plan Note (Addendum)
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 - 07/19/2013 notified pt and will return 07/24/13 for cxr/ esr then consider steroid rx (needs crypto serology also) - 07/24/2013 ESR 42 off pred x 3 weeks - 07/24/13 > crypto serology neg/ Anti-CCP neg  - started maint   prednisone 09/06/2013  >>> cxr cleared 10/18/2013 on 20 mg per day > rec taper to 20/10 - 11/28/13 reduced to 10 mg daily  - 03/07/2014 5 mg x 2 weeks then 5 mg qod x 2 weeks and stopped pred  04/10/14  - ESR 04/16/2015  = 24 / recurrent nodules > rec pred 20 mg daily > nodules resolved on f/u cxr 06/14/2015  - 04/30/2015 rec ceiling of 20 and floor of 10/5  - ESR 11/04/2015 =  23  On pred 10/5, rec taper off x 6 weeks if tol> flared in June 2017 so resumed last week in June 2017 20 ceiling/ 10/5 floor - ESR 05/07/2016   =  17 on prednisone 20 mg daily> rec 20/10 > did not tol - 09/28/2016 rec reduce from 20 mg to 15 mg daily   - ESR 11/20/2016  =  21 on pred 20/15 so rec 15 mg daily  - ESR 01/11/2017  =  46 with ? Mild  Flare on pred 5 so rec 10 until better then taper to floor of 10 /5  > did not tol - ESR 08/16/2017 =   54 but cxr / exam ok On pred 10 mg daily>  rec try 10/5 again with ceiling of 20 mg per day if worse    The goal with a chronic steroid dependent illness is always arriving at the lowest effective dose that controls the disease/symptoms and not accepting a set "formula" which is based on statistics or guidelines that don't always take into account  patient  variability or the natural hx of the dz in every individual patient, which may well vary over time.  For now therefore I recommend the patient maintain  try 10/5 again with ceiling of 20 mg per day if worse and very low threshold to move floor back to 10 mg daily based on today's esr, albeit non-specific.  I had an extended discussion with the patient/ husband reviewing all relevant studies completed to date (including extensive inpt records)  and  lasting 25 minutes of a 40  Minute transitionof care  visit  Post hosp f/u ov  Each maintenance medication was reviewed in detail including most importantly the difference between maintenance and prns and under what circumstances the prns are to be triggered using an action plan format that is not reflected in the computer generated alphabetically organized AVS.    Please see AVS for specific instructions unique to this visit that I personally wrote and verbalized to the the pt in detail and then reviewed with pt  by my nurse highlighting any  changes in therapy recommended at today's visit to their plan of care.

## 2017-08-18 DIAGNOSIS — I5042 Chronic combined systolic (congestive) and diastolic (congestive) heart failure: Secondary | ICD-10-CM | POA: Diagnosis not present

## 2017-08-18 DIAGNOSIS — E119 Type 2 diabetes mellitus without complications: Secondary | ICD-10-CM | POA: Diagnosis not present

## 2017-08-18 DIAGNOSIS — I2510 Atherosclerotic heart disease of native coronary artery without angina pectoris: Secondary | ICD-10-CM | POA: Diagnosis not present

## 2017-08-18 DIAGNOSIS — M1991 Primary osteoarthritis, unspecified site: Secondary | ICD-10-CM | POA: Diagnosis not present

## 2017-08-18 DIAGNOSIS — I110 Hypertensive heart disease with heart failure: Secondary | ICD-10-CM | POA: Diagnosis not present

## 2017-08-20 DIAGNOSIS — M1991 Primary osteoarthritis, unspecified site: Secondary | ICD-10-CM | POA: Diagnosis not present

## 2017-08-20 DIAGNOSIS — I2510 Atherosclerotic heart disease of native coronary artery without angina pectoris: Secondary | ICD-10-CM | POA: Diagnosis not present

## 2017-08-20 DIAGNOSIS — E119 Type 2 diabetes mellitus without complications: Secondary | ICD-10-CM | POA: Diagnosis not present

## 2017-08-20 DIAGNOSIS — I5042 Chronic combined systolic (congestive) and diastolic (congestive) heart failure: Secondary | ICD-10-CM | POA: Diagnosis not present

## 2017-08-20 DIAGNOSIS — I110 Hypertensive heart disease with heart failure: Secondary | ICD-10-CM | POA: Diagnosis not present

## 2017-08-21 DIAGNOSIS — I5042 Chronic combined systolic (congestive) and diastolic (congestive) heart failure: Secondary | ICD-10-CM | POA: Diagnosis not present

## 2017-08-21 DIAGNOSIS — I110 Hypertensive heart disease with heart failure: Secondary | ICD-10-CM | POA: Diagnosis not present

## 2017-08-21 DIAGNOSIS — M1991 Primary osteoarthritis, unspecified site: Secondary | ICD-10-CM | POA: Diagnosis not present

## 2017-08-21 DIAGNOSIS — I2510 Atherosclerotic heart disease of native coronary artery without angina pectoris: Secondary | ICD-10-CM | POA: Diagnosis not present

## 2017-08-21 DIAGNOSIS — E119 Type 2 diabetes mellitus without complications: Secondary | ICD-10-CM | POA: Diagnosis not present

## 2017-08-23 DIAGNOSIS — E119 Type 2 diabetes mellitus without complications: Secondary | ICD-10-CM | POA: Diagnosis not present

## 2017-08-23 DIAGNOSIS — I2510 Atherosclerotic heart disease of native coronary artery without angina pectoris: Secondary | ICD-10-CM | POA: Diagnosis not present

## 2017-08-23 DIAGNOSIS — I5042 Chronic combined systolic (congestive) and diastolic (congestive) heart failure: Secondary | ICD-10-CM | POA: Diagnosis not present

## 2017-08-23 DIAGNOSIS — I110 Hypertensive heart disease with heart failure: Secondary | ICD-10-CM | POA: Diagnosis not present

## 2017-08-23 DIAGNOSIS — M1991 Primary osteoarthritis, unspecified site: Secondary | ICD-10-CM | POA: Diagnosis not present

## 2017-08-27 ENCOUNTER — Ambulatory Visit (HOSPITAL_COMMUNITY)
Admission: RE | Admit: 2017-08-27 | Discharge: 2017-08-27 | Disposition: A | Discharge status: Home / Self Care | Payer: Medicare Other | Source: Ambulatory Visit | Attending: Cardiology | Admitting: Cardiology

## 2017-08-27 ENCOUNTER — Encounter (HOSPITAL_COMMUNITY): Payer: Self-pay | Admitting: Cardiology

## 2017-08-27 VITALS — BP 124/64 | HR 76 | Wt 234.5 lb

## 2017-08-27 DIAGNOSIS — Z951 Presence of aortocoronary bypass graft: Secondary | ICD-10-CM | POA: Insufficient documentation

## 2017-08-27 DIAGNOSIS — Z7901 Long term (current) use of anticoagulants: Secondary | ICD-10-CM | POA: Diagnosis not present

## 2017-08-27 DIAGNOSIS — I5022 Chronic systolic (congestive) heart failure: Secondary | ICD-10-CM | POA: Insufficient documentation

## 2017-08-27 DIAGNOSIS — I255 Ischemic cardiomyopathy: Secondary | ICD-10-CM | POA: Diagnosis not present

## 2017-08-27 DIAGNOSIS — Z9981 Dependence on supplemental oxygen: Secondary | ICD-10-CM | POA: Diagnosis not present

## 2017-08-27 DIAGNOSIS — Z7952 Long term (current) use of systemic steroids: Secondary | ICD-10-CM | POA: Diagnosis not present

## 2017-08-27 DIAGNOSIS — Z87891 Personal history of nicotine dependence: Secondary | ICD-10-CM | POA: Diagnosis not present

## 2017-08-27 DIAGNOSIS — E785 Hyperlipidemia, unspecified: Secondary | ICD-10-CM | POA: Diagnosis not present

## 2017-08-27 DIAGNOSIS — J45909 Unspecified asthma, uncomplicated: Secondary | ICD-10-CM | POA: Insufficient documentation

## 2017-08-27 DIAGNOSIS — Z86718 Personal history of other venous thrombosis and embolism: Secondary | ICD-10-CM | POA: Diagnosis not present

## 2017-08-27 DIAGNOSIS — M1990 Unspecified osteoarthritis, unspecified site: Secondary | ICD-10-CM | POA: Insufficient documentation

## 2017-08-27 DIAGNOSIS — Z95810 Presence of automatic (implantable) cardiac defibrillator: Secondary | ICD-10-CM | POA: Insufficient documentation

## 2017-08-27 DIAGNOSIS — E669 Obesity, unspecified: Secondary | ICD-10-CM | POA: Insufficient documentation

## 2017-08-27 DIAGNOSIS — Z7902 Long term (current) use of antithrombotics/antiplatelets: Secondary | ICD-10-CM | POA: Insufficient documentation

## 2017-08-27 DIAGNOSIS — I110 Hypertensive heart disease with heart failure: Secondary | ICD-10-CM | POA: Insufficient documentation

## 2017-08-27 DIAGNOSIS — R32 Unspecified urinary incontinence: Secondary | ICD-10-CM | POA: Insufficient documentation

## 2017-08-27 DIAGNOSIS — N3281 Overactive bladder: Secondary | ICD-10-CM | POA: Insufficient documentation

## 2017-08-27 DIAGNOSIS — K219 Gastro-esophageal reflux disease without esophagitis: Secondary | ICD-10-CM | POA: Diagnosis not present

## 2017-08-27 DIAGNOSIS — I252 Old myocardial infarction: Secondary | ICD-10-CM | POA: Diagnosis not present

## 2017-08-27 DIAGNOSIS — I2510 Atherosclerotic heart disease of native coronary artery without angina pectoris: Secondary | ICD-10-CM | POA: Insufficient documentation

## 2017-08-27 DIAGNOSIS — Z794 Long term (current) use of insulin: Secondary | ICD-10-CM | POA: Insufficient documentation

## 2017-08-27 DIAGNOSIS — Z79899 Other long term (current) drug therapy: Secondary | ICD-10-CM | POA: Diagnosis not present

## 2017-08-27 DIAGNOSIS — I5042 Chronic combined systolic (congestive) and diastolic (congestive) heart failure: Secondary | ICD-10-CM

## 2017-08-27 LAB — CBC
HEMATOCRIT: 40.1 % (ref 36.0–46.0)
HEMOGLOBIN: 12.3 g/dL (ref 12.0–15.0)
MCH: 31 pg (ref 26.0–34.0)
MCHC: 30.7 g/dL (ref 30.0–36.0)
MCV: 101 fL — ABNORMAL HIGH (ref 78.0–100.0)
Platelets: 224 10*3/uL (ref 150–400)
RBC: 3.97 MIL/uL (ref 3.87–5.11)
RDW: 16.6 % — AB (ref 11.5–15.5)
WBC: 19.7 10*3/uL — AB (ref 4.0–10.5)

## 2017-08-27 LAB — BASIC METABOLIC PANEL
ANION GAP: 10 (ref 5–15)
BUN: 19 mg/dL (ref 6–20)
CHLORIDE: 100 mmol/L — AB (ref 101–111)
CO2: 30 mmol/L (ref 22–32)
Calcium: 9.5 mg/dL (ref 8.9–10.3)
Creatinine, Ser: 1.13 mg/dL — ABNORMAL HIGH (ref 0.44–1.00)
GFR calc Af Amer: 53 mL/min — ABNORMAL LOW (ref >60)
GFR calc non Af Amer: 46 mL/min — ABNORMAL LOW (ref >60)
Glucose, Bld: 114 mg/dL — ABNORMAL HIGH (ref 65–99)
POTASSIUM: 4.5 mmol/L (ref 3.5–5.1)
SODIUM: 140 mmol/L (ref 135–145)

## 2017-08-27 LAB — LIPID PANEL
CHOL/HDL RATIO: 3.1 ratio
Cholesterol: 172 mg/dL (ref 0–200)
HDL: 55 mg/dL (ref >40)
LDL Cholesterol: 79 mg/dL (ref 0–99)
TRIGLYCERIDES: 188 mg/dL — AB (ref <150)
VLDL: 38 mg/dL (ref 0–40)

## 2017-08-27 MED ORDER — BISOPROLOL FUMARATE 5 MG PO TABS: 5.0000 mg | ORAL_TABLET | Freq: Every day | ORAL | 6 refills | Status: DC

## 2017-08-27 NOTE — Patient Instructions (Signed)
Stop Elequis  Start Aspirin 81 mg (1 tab) daily  Increase Bisoprolol 5 mg (1 tab) daily  Your physician has requested that you have an echocardiogram. Echocardiography is a painless test that uses sound waves to create images of your heart. It provides your doctor with information about the size and shape of your heart and how well your heart's chambers and valves are working. This procedure takes approximately one hour. There are no restrictions for this procedure.  (They will call you)    You have been referred to Cardiac Rehab  Your physician recommends that you schedule a follow-up appointment in: 3 months with Dr. Aundra Dubin

## 2017-08-28 NOTE — Progress Notes (Signed)
HF Cardiology: Dr Aundra Dubin  Pulmonologist: Dr Melvyn Novas.   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   She had a 32 day-long admission (10/31-12/1/18) for acute hypoxic respiratory failure secondary to acute decompensated HF which was a new diagnosis at the time. She required intubation x2 and hospital course was complicated by MSSA pneumonia and RUE DVT.  LHC 06/16/16 with 95% stenosis of mid RCA and s/p DES. She was discharged to SNF on Entresto 49/51, Lasix 40 QD, spironolactone 25 QD, Plavix and Eliquis 5 BID. Weight 219 pounds at time of discharge.   Admitted 07/07/16 from SNF with presyncope and hypotension. HF meds held. Received IV fluids and antibiotics.   Recently, she has been doing well.  Weight has been trending down slowly at home.  She is short of breath after walking about 300 feet, which is improved.  She is short of breath walking up stairs.  No chest pain. No orthopnea/PND.  Using oxygen at all times.   Labs (1/19): K 5.1, creatinine 1.1  ECG (1/19, personally reviewed): NSR at 100 bpm, nonspecific T wave flattening.    05/2017 LHC with DES to RCA (95% mid RCA stenosis).    06/14/17 Upper Extremity US-with non-occlusive DVT in right axillary vein. Noted around PICC line in right basilic vein, and short segment of SVT noted in the forearm within the cephalic vein. LUE negative  05/26/2017 ECHO EF 35-40% Grade I DD  ROS: All systems negative except as listed in HPI, PMH and Problem List.  SH:  Social History   Socioeconomic History  . Marital status: Married    Spouse name: Not on file  . Number of children: Not on file  . Years of education: Not on file  . Highest education level: Not on file  Social Needs  . Financial resource strain: Not on file  . Food insecurity - worry: Not on file   . Food insecurity - inability: Not on file  . Transportation needs - medical: Not on file  . Transportation needs - non-medical: Not on file  Occupational History  . Not on file  Tobacco Use  . Smoking status: Former Smoker    Packs/day: 1.00    Years: 25.00    Pack years: 25.00    Types: Cigarettes    Last attempt to quit: 07/27/1982    Years since quitting: 35.1  . Smokeless tobacco: Never Used  Substance and Sexual Activity  . Alcohol use: Yes    Alcohol/week: 0.0 oz    Comment: wine-rare  . Drug use: No  . Sexual activity: Not Currently  Other Topics Concern  . Not on file  Social History Narrative  . Not on file    FH:  Family History  Problem Relation Age of Onset  . Stroke Mother   . Diabetes Mother 86  . Stroke Father   . Heart failure Father   . Breast cancer Maternal Grandmother   . Colon cancer Neg Hx     Past Medical History:  Diagnosis Date  . Allergy    allergic rhinitis  . Asthma   . Chronic bronchitis ([place])   . Coronary artery disease    cath January 2011 with DEs LAD and RCA  . Dyspnea   . Full dentures   .  Gallstones   . GERD (gastroesophageal reflux disease)   . History of echocardiogram    Echo 5/17: mod LVH, EF 50-55%, ant-septal HK, Gr 1 DD, mod LAE //  b.  Echo 7/17: mild concentric LVH, EF 45-50%, inf-lat, inf, inf-septal HK, mild LAE  . History of nuclear stress test    Myoview 7/17: EF 48%, small mild apical defect, no ischemia, low risk  . Hyperlipidemia   . Hypertension   . 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 Myopia   . 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 Nocturnal oxygen desaturation    o2 at night  . Obesity   . Osteoarthritis    knees, fingers, shoulders  . Overactive bladder   . Oxygen deficiency    uses oxygen all day  . Rash    and other non specific skin eruptions  . Retaining fluid    in ankles and feet  . Urinary incontinence   .  Vertigo   . Wears glasses     Current Outpatient Medications  Medication Sig Dispense Refill  . acetaminophen (TYLENOL) 325 MG tablet Take 650 mg by mouth every 6 (six) hours as needed for mild pain. Take as needed per bottle     . albuterol (PROAIR HFA) 108 (90 Base) MCG/ACT inhaler INHALE 2 PUFFS BY MOUTH EVERY 4 HOURS AS NEEDED FOR WHEEZING OR FOR SHORTNESS OF BREATH 8 Inhaler 5  . bisoprolol (ZEBETA) 5 MG tablet Take 1 tablet (5 mg total) by mouth daily. 30 tablet 6  . chlorpheniramine (CHLOR-TRIMETON) 4 MG tablet Take 4 mg by mouth 2 (two) times daily as needed for allergies.    Marland Kitchen clopidogrel (PLAVIX) 75 MG tablet TAKE 1 TABLET BY MOUTH EVERY DAY 30 tablet 11  . cyanocobalamin (,VITAMIN B-12,) 1000 MCG/ML injection Inject 1,000 mcg into the muscle every 3 (three) months.    . ENTRESTO 49-51 MG TAKE 1 TABLET BY MOUTH TWICE A DAY 60 tablet 11  . EPIPEN 2-PAK 0.3 MG/0.3ML SOAJ injection INJECT 0.3 MLS (0.3 MG TOTAL) INTO THE MUSCLE ONCE AS NEEDED FOR ALLERGIC REACTION 2 Device 0  . esomeprazole (NEXIUM) 40 MG capsule Take 1 capsule (40 mg total) by mouth daily. 30 capsule 0  . ezetimibe (ZETIA) 10 MG tablet TAKE 1 TABLET (10 MG TOTAL) BY MOUTH DAILY. 30 tablet 0  . furosemide (LASIX) 20 MG tablet Take 1 tablet (20 mg total) by mouth daily. 39 tablet 9  . insulin glargine (LANTUS) 100 UNIT/ML injection Inject 0.22 mLs (22 Units total) into the skin daily. 10 mL 1  . Maltodextrin-Xanthan Gum (RESOURCE THICKENUP CLEAR) POWD Oral, As needed, other    . Multiple Vitamin (MULTIVITAMIN) capsule Take 1 capsule by mouth daily. 30 capsule 0  . OXYGEN 24/7 2 lpm  [employer]    . pravastatin (PRAVACHOL) 40 MG tablet TAKE 1 TABLET BY MOUTH EVERY DAY AT 6PM 30 tablet 11  . predniSONE (DELTASONE) 10 MG tablet Take 1 tablet (10 mg total) by mouth daily with breakfast. 90 tablet 0  . ranitidine (ZANTAC) 75 MG tablet Take 4 tablets (300 mg total) by mouth at bedtime. 30 tablet 0  . spironolactone (ALDACTONE) 25  MG tablet Take 0.5 tablets (12.5 mg total) by mouth daily. 30 tablet 0   No current facility-administered medications for this encounter.     Vitals:   08/27/17 0914  BP: 124/64  Pulse: 76  SpO2: 98%  Weight: 234 lb 8 oz (106.4 kg)    PHYSICAL EXAM: General: NAD Neck: Thick, no JVD, no thyromegaly or thyroid nodule.  Lungs: Clear to auscultation bilaterally with normal respiratory effort. CV: Nondisplaced PMI.  Heart regular S1/S2, no S3/S4, no murmur.  No peripheral edema.  No carotid bruit.  Normal pedal pulses.  Abdomen: Soft, nontender, no hepatosplenomegaly, no distention.  Skin: Intact without lesions or rashes.  Neurologic: Alert and oriented x 3.  Psych: Normal affect. Extremities: No clubbing or cyanosis.  HEENT: Normal.   ASSESSMENT & PLAN: 1. Chronic systolic CHF: Ischemic cardiomyopathy.  Echo 10/18 with EF 35%.  NYHA class III symptoms, overall improved.  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   - Continue 12.5 mg spironolactone daily.  - Continue Entresto 49/51 bid.  - Increase bisoprolol to 5 mg daily.  - Continue Lasix 20 mg daily, repeat BMET today.  - Needs repeat echo, if EF < 35% will need ICD.  2. CAD: Most recently had DES to [facility] in 11/18.  No chest pain.  Has been on Eliquis x 3 months for DVT.  - Can stop Eliquis today and start ASA 81 to go along with Plavix 75 mg daily.  - Continue statin. Check lipids today.  - I will arrange for cardiac rehab.  3 H/O AKI: Check BMET today. 4. 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  5. RUE DVT: In 11/18, related to PICC.  She has been on Eliquis x 3 months, can stop today.   Loralie Champagne 08/28/2017

## 2017-08-31 ENCOUNTER — Other Ambulatory Visit: Payer: Self-pay | Admitting: *Deleted

## 2017-08-31 ENCOUNTER — Encounter (HOSPITAL_COMMUNITY): Payer: Self-pay

## 2017-08-31 MED ORDER — BISOPROLOL FUMARATE 5 MG PO TABS: 5.0000 mg | ORAL_TABLET | Freq: Every day | ORAL | 6 refills | Status: DC

## 2017-09-02 ENCOUNTER — Other Ambulatory Visit: Payer: Self-pay

## 2017-09-02 ENCOUNTER — Ambulatory Visit (HOSPITAL_COMMUNITY): Payer: Medicare Other | Attending: Cardiovascular Disease

## 2017-09-02 DIAGNOSIS — Z8249 Family history of ischemic heart disease and other diseases of the circulatory system: Secondary | ICD-10-CM | POA: Insufficient documentation

## 2017-09-02 DIAGNOSIS — E785 Hyperlipidemia, unspecified: Secondary | ICD-10-CM | POA: Diagnosis not present

## 2017-09-02 DIAGNOSIS — I2510 Atherosclerotic heart disease of native coronary artery without angina pectoris: Secondary | ICD-10-CM | POA: Insufficient documentation

## 2017-09-02 DIAGNOSIS — I509 Heart failure, unspecified: Secondary | ICD-10-CM | POA: Diagnosis not present

## 2017-09-02 DIAGNOSIS — I110 Hypertensive heart disease with heart failure: Secondary | ICD-10-CM | POA: Insufficient documentation

## 2017-09-02 DIAGNOSIS — Z87891 Personal history of nicotine dependence: Secondary | ICD-10-CM | POA: Insufficient documentation

## 2017-09-02 DIAGNOSIS — I071 Rheumatic tricuspid insufficiency: Secondary | ICD-10-CM | POA: Insufficient documentation

## 2017-09-02 DIAGNOSIS — I313 Pericardial effusion (noninflammatory): Secondary | ICD-10-CM | POA: Insufficient documentation

## 2017-09-02 DIAGNOSIS — G4733 Obstructive sleep apnea (adult) (pediatric): Secondary | ICD-10-CM | POA: Insufficient documentation

## 2017-09-02 DIAGNOSIS — I252 Old myocardial infarction: Secondary | ICD-10-CM | POA: Diagnosis not present

## 2017-09-02 DIAGNOSIS — E669 Obesity, unspecified: Secondary | ICD-10-CM | POA: Diagnosis not present

## 2017-09-02 DIAGNOSIS — Z6839 Body mass index (BMI) 39.0-39.9, adult: Secondary | ICD-10-CM | POA: Insufficient documentation

## 2017-09-02 DIAGNOSIS — I5042 Chronic combined systolic (congestive) and diastolic (congestive) heart failure: Secondary | ICD-10-CM | POA: Insufficient documentation

## 2017-09-02 DIAGNOSIS — J45909 Unspecified asthma, uncomplicated: Secondary | ICD-10-CM | POA: Insufficient documentation

## 2017-09-03 ENCOUNTER — Other Ambulatory Visit: Payer: Self-pay | Admitting: Family Medicine

## 2017-09-03 NOTE — Telephone Encounter (Signed)
Her cardiologist stopped it  Please decline  Thanks

## 2017-09-03 NOTE — Telephone Encounter (Signed)
No on med list but plavix is, please advise

## 2017-09-04 ENCOUNTER — Other Ambulatory Visit: Payer: Self-pay | Admitting: Family Medicine

## 2017-09-06 NOTE — Telephone Encounter (Signed)
Electronic refill request Last office visit 07/13/17 See drug interaction with Entresto.

## 2017-09-07 ENCOUNTER — Other Ambulatory Visit (HOSPITAL_COMMUNITY): Payer: Self-pay | Admitting: *Deleted

## 2017-09-07 MED ORDER — BISOPROLOL FUMARATE 5 MG PO TABS: 5.0000 mg | ORAL_TABLET | Freq: Every day | ORAL | 6 refills | Status: DC

## 2017-09-07 MED FILL — BISOPROLOL FUMARATE 5 MG TA: 5 | 30 days supply | Qty: 30 | Fill #0

## 2017-09-15 DIAGNOSIS — R0902 Hypoxemia: Secondary | ICD-10-CM | POA: Diagnosis not present

## 2017-09-28 DIAGNOSIS — E119 Type 2 diabetes mellitus without complications: Secondary | ICD-10-CM | POA: Diagnosis not present

## 2017-09-28 LAB — HM DIABETES EYE EXAM

## 2017-10-01 ENCOUNTER — Ambulatory Visit: Payer: Self-pay | Admitting: *Deleted

## 2017-10-01 NOTE — Telephone Encounter (Addendum)
Patient phoned requesting an appointment with Dr. Glori Bickers. It is noted she recently had been hospitalized. She has a complaint of mid to right-sided back more towards the side about mid way up back. This has been going on approx. 2 weeks. She denies fever, cough and SOB. She also reports Lt ear pain/throbbing that extends to below her ear. Stated the ear pain began about 1 week ago. She takes an aspirin-free medication that only helps slightly. No noticed stress in her voice. Monday appointment with PCP. Advised patient to seek care if either pain worsened, fever or difficultly breathing at [facility] or ER.   Answer Assessment - Initial Assessment Questions 1. LOCATION: "Which ear is involved?"     Rt 2. ONSET: "When did the ear start hurting"     1 week ago  3. SEVERITY: "How bad is the pain?"  (Scale 1-10; mild, moderate or severe)   - MILD (1-3): doesn't interfere with normal activities    - MODERATE (4-7): interferes with normal activities or awakens from sleep    - SEVERE (8-10): excruciating pain, unable to do any normal activities     Moderate, throbbing pain and it runs halfway down neck. Feels clogged. 4. URI SYMPTOMS: " Do you have a runny nose or cough?"   Runny nose 5. FEVER: "Do you have a fever?" If so, ask: "What is your temperature, how was it measured, and when did it start?"    fever 6. CAUSE: "Have you been swimming recently?", "How often do you use Q-TIPS?", "Have you had any recent air travel or scuba diving?"  no 7. OTHER SYMPTOMS: "Do you have any other symptoms?" (e.g., headache, stiff neck, dizziness, vomiting, runny nose, decreased hearing)   Neck feels stiff with the ear pain, hurts to turn to the right.  8. PREGNANCY: "Is there any chance you are pregnant?" "When was your last menstrual period?"  no  Protocols used: EARACHE-A-AH

## 2017-10-04 ENCOUNTER — Ambulatory Visit: Payer: Self-pay | Admitting: Family Medicine

## 2017-10-04 DIAGNOSIS — Z0289 Encounter for other administrative examinations: Secondary | ICD-10-CM

## 2017-10-07 MED FILL — BISOPROLOL FUMARATE 5 MG TA: 5 | 30 days supply | Qty: 30 | Fill #1

## 2017-10-13 DIAGNOSIS — R0902 Hypoxemia: Secondary | ICD-10-CM | POA: Diagnosis not present

## 2017-10-28 ENCOUNTER — Other Ambulatory Visit: Payer: Self-pay | Admitting: *Deleted

## 2017-10-28 MED ORDER — INSULIN GLARGINE 100 UNIT/ML ~~LOC~~ SOLN: 22.0000 [IU] | Freq: Every day | SUBCUTANEOUS | 5 refills | Status: DC

## 2017-10-28 NOTE — Telephone Encounter (Signed)
Fax refill request, it doesn't look like you have filled this med before, please advise

## 2017-11-03 ENCOUNTER — Ambulatory Visit (INDEPENDENT_AMBULATORY_CARE_PROVIDER_SITE_OTHER): Payer: Medicare Other

## 2017-11-03 ENCOUNTER — Telehealth: Payer: Self-pay | Admitting: Family Medicine

## 2017-11-03 DIAGNOSIS — E538 Deficiency of other specified B group vitamins: Secondary | ICD-10-CM

## 2017-11-03 MED ORDER — CYANOCOBALAMIN 1000 MCG/ML IJ SOLN
1000.0000 ug | Freq: Once | INTRAMUSCULAR | Status: AC
  Administered 2017-11-03: 1000 ug via INTRAMUSCULAR

## 2017-11-03 NOTE — Telephone Encounter (Signed)
Fasting blood sugars look mostly very good  Later ones are more variable-(with a few highs but none very concerning) Watch diet the best you can  Follow up please in June  Hope she is doing well  Thanks

## 2017-11-03 NOTE — Telephone Encounter (Signed)
Pt dropped off blood sugar readings  On cart  Pt wanted to know when she needed to come back for follow up

## 2017-11-04 NOTE — Telephone Encounter (Signed)
Pt notified of Dr. Tower's comments and lab appt scheduled  

## 2017-11-08 MED FILL — BISOPROLOL FUMARATE 5 MG TA: 5 | 30 days supply | Qty: 30 | Fill #2

## 2017-11-13 DIAGNOSIS — R0902 Hypoxemia: Secondary | ICD-10-CM | POA: Diagnosis not present

## 2017-11-15 ENCOUNTER — Other Ambulatory Visit: Payer: Self-pay | Admitting: Internal Medicine

## 2017-11-15 ENCOUNTER — Telehealth: Payer: Self-pay | Admitting: Internal Medicine

## 2017-11-15 ENCOUNTER — Encounter: Payer: Self-pay | Admitting: Internal Medicine

## 2017-11-15 ENCOUNTER — Ambulatory Visit: Payer: Medicare Other | Admitting: Internal Medicine

## 2017-11-15 ENCOUNTER — Other Ambulatory Visit (INDEPENDENT_AMBULATORY_CARE_PROVIDER_SITE_OTHER): Payer: Medicare Other

## 2017-11-15 VITALS — BP 124/72 | HR 66 | Wt 232.8 lb

## 2017-11-15 DIAGNOSIS — J9611 Chronic respiratory failure with hypoxia: Secondary | ICD-10-CM

## 2017-11-15 DIAGNOSIS — J9612 Chronic respiratory failure with hypercapnia: Principal | ICD-10-CM

## 2017-11-15 DIAGNOSIS — R918 Other nonspecific abnormal finding of lung field: Secondary | ICD-10-CM

## 2017-11-15 LAB — SEDIMENTATION RATE: SED RATE: 20 mm/h (ref 0–30)

## 2017-11-15 NOTE — Patient Instructions (Signed)
Please remember to go to the lab department downstairs in the basement  for your tests - we will call you with the results when they are available.       Please schedule a follow up visit in 3 months but call sooner if needed  

## 2017-11-15 NOTE — Progress Notes (Signed)
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    History of Present Illness  07/03/2013 f/u ov/Wert re: sob/cough/ wheeze/ MPN Chief Complaint  Patient presents with  . Follow-up    Pt had PET scan done this am. Her cough and SOB have improved since the last visit.   saba inhaler helps some/ min mucoid sputum, temp to 99 but no rigors, no purulent sputum or hemoptysis/ does have some wt. Loss, no sweating.  rec Dulera 100 Take 2 puffs first thing in am and then another 2 puffs about 12 hours later.  Prednisone 10 mg take  4 each am x 2 days,   2 each am x 2 days,  1 each am x 2 days and stop Only use your albuterol as a rescue medication  We will arrange 24/7 02 at 3lpm per advanced  We will call to arrange a biopsy of the easiest area once I discuss this with radiology  Late add: Discussed in detail all the  indications, usual  risks and alternatives  relative to the benefits with patient who agrees to proceed with bronchoscopy with biopsy.   FOB > done 07/05/2013 > nl airways > neg afb/fungus/path  - IR Bx 07/11/2013 > inflammatory, special stains pending but does not look like tb or fungus and responds short course pred per pt > rec prednisone x 6 days only and needs VATS bx but declines as of 07/13/2013 > rec'd final path from [place] review > c/w BOOP though can't exclude asp/infection sources        11/20/2016 acute extended ov/Wert re:  BOOP/ pred 20/15 alternating and 2lpm 24/7 and increased to 3lpm to control Chief Complaint  Patient presents with  . Acute Visit    Pt c/o increased SOB, cough, and fatigue for the past 1-2 wks. Her cough is non prod.  She states she wakes up in the am very tired.    gradually sob /cough worse p eating / eats 10 pm, no candy handy and having lots of throat clearing/ sensation of daytime pnds / no better with saba  Doe now = MMRC3 = can't walk 100 yards even at a slow pace at a flat grade s stopping due to sob   rec Please remember to go to the lab and x-ray department downstairs in the basement  for your tests - we will call you with the results when they are available.  For drainage / throat tickle try take CHLORPHENIRAMINE  4 mg - take one every 4 hours as needed GERD  Keep your previous appt  Add :  Consider challenging with symbicort to see if helps cough/ systemic steroid dep    01/11/2017  f/u ov/Wert re:  BOOP/ steroid dep  -  No med calendar  Chief Complaint  Patient presents with  . Follow-up    3 month follow up. Patient states she is not feeling well. Feeling congested. Coughing. States she is not able to walk 19ft on 2L of o2 before feeling out of breath.   gradually tapered pred from 20/15  Started feeling worse when reduced  pred  to 5 mg daily x 3 weeks prior to OV  And weak since. Bad chills x 2 x  1-2 days prior to OV  But s excess/ purulent sputum or mucus plugs  And none in last 24h rec Please remember to go to the lab and x-ray department downstairs in the basement  for your tests - we will call you with the results when they are available. Prednisone 10 mg daily until better or otherwise notified and then when better 10/5 alternating days     Admit date: 07/07/2017 Discharge date: 07/10/2017  Recommendations for Outpatient Follow-up:  Please note that lasix dose is now 20 mg a day due to soft blood pressure in hospital. Follow up with cardio per sch appt to make sure this dose is tolerated. Aldactone is 12.5 mg a day. Continue Cipro for 4 days on discharge for UTI.   Discharge Diagnoses:    Near syncope   Acute kidney injury [facility])   Coronary artery disease    Hypertension   Acute on chronic respiratory failure with hypoxia (HCC)   Chronic systolic heart failure (HCC)   Abnormal urinalysis   Dysphagia   Acute deep vein thrombosis (DVT) of right upper extremity (HCC)   Diabetes mellitus type 2 in obese (HCC)   Acute metabolic encephalopathy   Gluteal cleft wound    08/16/2017  Extended post hosp transition of care  f/u ov/Wert re:  Re-establish re BOOP steroid dep/ pred 10 mg daily plus new dx ischemic chf  Chief Complaint  Patient presents with  . Hospitalization Follow-up    Breathing is worse today which she relates to colder weather. She has not had to use her rescue inhaler.   doe = MMRC3 = can't walk 100 yards even at a slow pace at a flat grade s stopping due to sob  On up to 3lpm  Sleeping fine/ hoarse but not coughing. rec Try prednisone 10 mg on even days and 5 mg on odd if tolerated    11/15/2017  f/u ov/Wert re: boop/ 02 dep on 10/5 pred Chief Complaint  Patient presents with  . Follow-up    Breathing has improved some since her last visit. She rarely uses her rescue inhaler. She c/o itchy eyes and runny nose.   Dyspnea:  No change = MMRC3 = can't walk 100 yards even at a slow pace at a flat grade s stopping due to sob  On 3lpm  Cough: none Sleep: ok 30 degrees at 2lpm  SABA use:  Rare   No obvious day to day or daytime variability or assoc excess/ purulent sputum or mucus plugs or hemoptysis or cp or chest tightness, subjective wheeze or overt sinus or hb symptoms. No unusual exposure hx or h/o childhood pna/ asthma or knowledge of premature birth.  Sleeping  On 2lpm @ 30 degrees   without nocturnal  or early am exacerbation  of respiratory  c/o's or need for noct saba. Also denies any obvious fluctuation of symptoms with weather or environmental changes or other aggravating or alleviating factors except as outlined above   Current Allergies, Complete Past Medical History, Past Surgical History, Family History, and Social  History were reviewed in [employer] record.  ROS  The following are not active complaints unless bolded Hoarseness, sore throat, dysphagia, dental problems, itching, sneezing,  nasal congestion or discharge of excess mucus or purulent secretions, ear ache,   fever, chills, sweats, unintended wt loss or wt gain,  classically pleuritic or exertional cp,  orthopnea pnd or arm/hand swelling  or leg swelling, presyncope, palpitations, abdominal pain, anorexia, nausea, vomiting, diarrhea  or change in bowel habits or change in bladder habits, change in stools or change in urine, dysuria, hematuria,  rash, arthralgias, visual complaints, headache, numbness, weakness or ataxia or problems with walking or coordination,  change in mood or  memory.        Current Meds  Medication Sig  . acetaminophen (TYLENOL) 325 MG tablet Take 650 mg by mouth every 6 (six) hours as needed for mild pain. Take as needed per bottle   . albuterol (PROAIR HFA) 108 (90 Base) MCG/ACT inhaler INHALE 2 PUFFS BY MOUTH EVERY 4 HOURS AS NEEDED FOR WHEEZING OR FOR SHORTNESS OF BREATH  . bisoprolol (ZEBETA) 5 MG tablet Take 1 tablet (5 mg total) by mouth daily.  . chlorpheniramine (CHLOR-TRIMETON) 4 MG tablet Take 4 mg by mouth 2 (two) times daily as needed for allergies.  Marland Kitchen clopidogrel (PLAVIX) 75 MG tablet TAKE 1 TABLET BY MOUTH EVERY DAY  . cyanocobalamin (,VITAMIN B-12,) 1000 MCG/ML injection Inject 1,000 mcg into the muscle every 3 (three) months.  . ENTRESTO 49-51 MG TAKE 1 TABLET BY MOUTH TWICE A DAY  . EPIPEN 2-PAK 0.3 MG/0.3ML SOAJ injection INJECT 0.3 MLS (0.3 MG TOTAL) INTO THE MUSCLE ONCE AS NEEDED FOR ALLERGIC REACTION  . esomeprazole (NEXIUM) 40 MG capsule Take 1 capsule (40 mg total) by mouth daily.  Marland Kitchen ezetimibe (ZETIA) 10 MG tablet TAKE 1 TABLET (10 MG TOTAL) BY MOUTH DAILY.  . furosemide (LASIX) 20 MG tablet Take 1 tablet (20 mg total) by mouth daily.  . insulin glargine (LANTUS) 100 UNIT/ML  injection Inject 0.22 mLs (22 Units total) into the skin daily.  . Multiple Vitamin (MULTIVITAMIN) capsule Take 1 capsule by mouth daily.  . OXYGEN 24/7 2 lpm  [employer]  . pravastatin (PRAVACHOL) 40 MG tablet TAKE 1 TABLET BY MOUTH EVERY DAY AT 6PM  . predniSONE (DELTASONE) 10 MG tablet Take 1 tablet (10 mg total) by mouth daily with breakfast. (Patient taking differently: Take 10 mg by mouth daily with breakfast. Alternating with 5 mg)  . ranitidine (ZANTAC) 75 MG tablet Take 4 tablets (300 mg total) by mouth at bedtime.  Marland Kitchen spironolactone (ALDACTONE) 25 MG tablet TAKE 1/2 TAB BY MOUTH DAILY                 Objective:   Physical Exam 01/31/2013   237  > 07/03/2013  232 > 07/24/2013  231 > 09/06/2013  233 > 04/18/2014 256  10/18/2013  233 > 11/29/2013 238 >  03/07/2014  249 > 05/17/2014  263 > 06/26/2014 261>   10/01/2014  247 > 01/02/2015  253 >  04/16/2015 247 >  04/30/2015  244 > 06/14/2015   246 > 09/23/2015   255 > 11/04/2015 253  >  05/07/2016 259 > 06/23/2016   265  > 09/28/2016   247 > 11/20/2016  255 > 08/16/2017  231 > 11/15/2017    232    amb wf nad slt hoarse - all smiles today   Vital signs reviewed - Note on arrival 02 sats  97% on  2lpm pulsed        HEENT: nl  turbinates bilaterally, and oropharynx. Nl external ear canals without cough reflex - upper and lower dentures   NECK :  without JVD/Nodes/TM/ nl carotid upstrokes bilaterally   LUNGS: no acc muscle use,  Nl contour chest  which is clear to A and P bilaterally without cough on insp or exp maneuvers   CV:  RRR  no s3 or murmur or increase in P2, and no edema   ABD:  soft and nontender with nl inspiratory excursion in the supine position. No bruits or organomegaly appreciated, bowel sounds nl  MS:  Nl gait/ ext warm without deformities, calf tenderness, cyanosis or clubbing No obvious joint restrictions   SKIN: warm and dry without lesions    NEURO:  alert, approp, nl sensorium with  no motor or cerebellar deficits apparent.          11/15/2017 labs:  Lab Results  Component Value Date   ESRSEDRATE 20 11/15/2017   ESRSEDRATE 54 (H) 08/16/2017   ESRSEDRATE 40 (H) 06/18/2017

## 2017-11-15 NOTE — Telephone Encounter (Signed)
Order has been sent to [employer]

## 2017-11-15 NOTE — Telephone Encounter (Signed)
Ok order changed to [place]. Nothing further is needed.

## 2017-11-15 NOTE — Telephone Encounter (Signed)
Spoke to [place] at [facility].  Pt is now with [employer] & will not be able to switch companies until 07/05/18.  We will need a new order to send to [place] since one that was put in states to send to [facility].  Please change company to [facility] in [facility] note.

## 2017-11-16 ENCOUNTER — Encounter: Payer: Self-pay | Admitting: Internal Medicine

## 2017-11-16 NOTE — Assessment & Plan Note (Signed)
11/24/2012 Office walk showed desats 86% on RA  With  spirometry FEV1 nml-73% , ratio 80   11/25/2012 CTA Chest >>neg for PE/ ILD  12/07/12 ONO > Pos 5: 42min  begin O2 2 l/m At bedtime  12/13/2012  >  Repeat 02/01/13 on 2lpm 55min> no change rx  - 01/31/2013  Walked RA x 3 laps @ 185 ft each stopped due to  End of study, no desats  - 07/03/2013 sats 75% RA >  On 3lpm sats 93%  > rx with 24 h 02 at 3lpm   - improved by self monitoring as of 07/24/13 ov so changed rx to 2lpm/24/7 humidified due to nasal dryness - 09/06/2013  Walked 2lpm x  2 laps @ 185 ft each stopped due to legs tired, no desats    - 04/18/2014  Walked RA  @ mod pace x 2 laps @ 185 ft each stopped due to  Hip pain, slow pace, sats 89% at end  - 05/16/2014  [facility] RA  2 laps @ 185 ft each stopped due to sob/fatigue but no desats off pred x 04/10/14  - 04/16/2015  Walked RA x 3 laps @ 185 ft each stopped due to   - ONO RA   10/05/2014 >  02 sat < 89% 213 min so rec resume 2lpm and work on wt loss   - 04/16/2015  Walked RA x 3 laps @ 185 ft each stopped due to  91% End of study, nl pace, no sob or desat - HCO3  01/06/16 = 35    - HCO3  05/07/2016  = 39 assoc with wt gain  - HCO3 01/11/2017     = 40  - HCO3 08/16/2017     =  35 - HC03  08/27/17            = 30    As of  11/15/2017    02 2lpm sleeping, resting/  3lpm with activity

## 2017-11-16 NOTE — Assessment & Plan Note (Signed)
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 > discussed with IR, rec FOB > done 07/05/2013 > nl airways > neg afb/fungus/path  - IR Bx 07/11/2013 > inflammatory, special stains pending but does not look like tb or fungus and responds short course pred per pt > rec prednisone x 6 days only and needs VATS bx but declines as of 07/13/2013 > rec'd final path from [place] review > c/w BOOP though can't exclude asp/infection sources - 07/19/2013 notified pt and will return 07/24/13 for cxr/ esr then consider steroid rx (needs crypto serology also) - 07/24/2013 ESR 42 off pred x 3 weeks - 07/24/13 > crypto serology neg/ Anti-CCP neg  - started maint   prednisone 09/06/2013  >>> cxr cleared 10/18/2013 on 20 mg per day > rec taper to 20/10 - 11/28/13 reduced to 10 mg daily  - 03/07/2014 5 mg x 2 weeks then 5 mg qod x 2 weeks and stopped pred  04/10/14  - ESR 04/16/2015  = 24 / recurrent nodules > rec pred 20 mg daily > nodules resolved on f/u cxr 06/14/2015  - 04/30/2015 rec ceiling of 20 and floor of 10/5  - ESR 11/04/2015 =  23  On pred 10/5, rec taper off x 6 weeks if tol> flared in June 2017 so resumed last week in June 2017 20 ceiling/ 10/5 floor - ESR 05/07/2016   =  17 on prednisone 20 mg daily> rec 20/10 > did not tol   - ESR 11/15/2017  = 20 so rec 5 mg daily    The goal with a chronic steroid dependent illness is always arriving at the lowest effective dose that controls the disease/symptoms and not accepting a set "formula" which is based on statistics or guidelines that don't always take into account patient  variability or the natural hx of the dz in every individual patient, which may well vary over time.  For now therefore I recommend the patient maintain  5 mg daily as her boop may finally have gone into permanent remission.  Each maintenance medication was reviewed in detail including most  importantly the difference between maintenance and as needed and under what circumstances the prns are to be used.  Please see AVS for specific  Instructions which are unique to this visit and I personally typed out  which were reviewed in detail in writing with the patient and a copy provided.

## 2017-11-16 NOTE — Assessment & Plan Note (Signed)
Body mass index is 39.04 kg/m.  -  trending : no change  Lab Results  Component Value Date   TSH 1.452 06/15/2017     Contributing to gerd risk/ doe/reviewed the need and the process to achieve and maintain neg calorie balance > defer f/u primary care including intermittently monitoring thyroid status   Minimizing steroid dose should help here/ reviewed

## 2017-11-23 ENCOUNTER — Ambulatory Visit: Payer: Medicare Other | Admitting: Psychology

## 2017-11-23 DIAGNOSIS — F4323 Adjustment disorder with mixed anxiety and depressed mood: Secondary | ICD-10-CM

## 2017-11-26 ENCOUNTER — Encounter (HOSPITAL_COMMUNITY): Payer: Self-pay | Admitting: Cardiology

## 2017-11-26 ENCOUNTER — Telehealth (HOSPITAL_COMMUNITY): Payer: Self-pay

## 2017-11-26 ENCOUNTER — Other Ambulatory Visit: Payer: Self-pay

## 2017-11-26 ENCOUNTER — Ambulatory Visit (HOSPITAL_COMMUNITY)
Admission: RE | Admit: 2017-11-26 | Discharge: 2017-11-26 | Disposition: A | Discharge status: Home / Self Care | Payer: Medicare Other | Source: Ambulatory Visit | Attending: Cardiology | Admitting: Cardiology

## 2017-11-26 VITALS — BP 138/60 | HR 65 | Wt 235.0 lb

## 2017-11-26 DIAGNOSIS — Z86718 Personal history of other venous thrombosis and embolism: Secondary | ICD-10-CM | POA: Insufficient documentation

## 2017-11-26 DIAGNOSIS — Z794 Long term (current) use of insulin: Secondary | ICD-10-CM | POA: Diagnosis not present

## 2017-11-26 DIAGNOSIS — I5042 Chronic combined systolic (congestive) and diastolic (congestive) heart failure: Secondary | ICD-10-CM

## 2017-11-26 DIAGNOSIS — I5022 Chronic systolic (congestive) heart failure: Secondary | ICD-10-CM | POA: Insufficient documentation

## 2017-11-26 DIAGNOSIS — Z7982 Long term (current) use of aspirin: Secondary | ICD-10-CM | POA: Insufficient documentation

## 2017-11-26 DIAGNOSIS — Z955 Presence of coronary angioplasty implant and graft: Secondary | ICD-10-CM | POA: Diagnosis not present

## 2017-11-26 DIAGNOSIS — I255 Ischemic cardiomyopathy: Secondary | ICD-10-CM | POA: Diagnosis not present

## 2017-11-26 DIAGNOSIS — N179 Acute kidney failure, unspecified: Secondary | ICD-10-CM | POA: Insufficient documentation

## 2017-11-26 DIAGNOSIS — Z9981 Dependence on supplemental oxygen: Secondary | ICD-10-CM | POA: Diagnosis not present

## 2017-11-26 DIAGNOSIS — E785 Hyperlipidemia, unspecified: Secondary | ICD-10-CM | POA: Insufficient documentation

## 2017-11-26 DIAGNOSIS — Z87891 Personal history of nicotine dependence: Secondary | ICD-10-CM | POA: Diagnosis not present

## 2017-11-26 DIAGNOSIS — I252 Old myocardial infarction: Secondary | ICD-10-CM | POA: Insufficient documentation

## 2017-11-26 DIAGNOSIS — K219 Gastro-esophageal reflux disease without esophagitis: Secondary | ICD-10-CM | POA: Insufficient documentation

## 2017-11-26 DIAGNOSIS — I2510 Atherosclerotic heart disease of native coronary artery without angina pectoris: Secondary | ICD-10-CM

## 2017-11-26 DIAGNOSIS — J8489 Other specified interstitial pulmonary diseases: Secondary | ICD-10-CM | POA: Insufficient documentation

## 2017-11-26 DIAGNOSIS — J45909 Unspecified asthma, uncomplicated: Secondary | ICD-10-CM | POA: Insufficient documentation

## 2017-11-26 DIAGNOSIS — Z7902 Long term (current) use of antithrombotics/antiplatelets: Secondary | ICD-10-CM | POA: Diagnosis not present

## 2017-11-26 DIAGNOSIS — I110 Hypertensive heart disease with heart failure: Secondary | ICD-10-CM | POA: Diagnosis not present

## 2017-11-26 DIAGNOSIS — Z7952 Long term (current) use of systemic steroids: Secondary | ICD-10-CM | POA: Diagnosis not present

## 2017-11-26 LAB — BASIC METABOLIC PANEL
ANION GAP: 7 (ref 5–15)
BUN: 28 mg/dL — ABNORMAL HIGH (ref 6–20)
CALCIUM: 9.3 mg/dL (ref 8.9–10.3)
CO2: 31 mmol/L (ref 22–32)
Chloride: 100 mmol/L — ABNORMAL LOW (ref 101–111)
Creatinine, Ser: 1.28 mg/dL — ABNORMAL HIGH (ref 0.44–1.00)
GFR calc Af Amer: 46 mL/min — ABNORMAL LOW (ref >60)
GFR, EST NON AFRICAN AMERICAN: 39 mL/min — AB (ref >60)
GLUCOSE: 99 mg/dL (ref 65–99)
POTASSIUM: 5.6 mmol/L — AB (ref 3.5–5.1)
SODIUM: 138 mmol/L (ref 135–145)

## 2017-11-26 MED ORDER — SPIRONOLACTONE 25 MG PO TABS: 25.0000 mg | ORAL_TABLET | Freq: Every day | ORAL | 5 refills | Status: DC

## 2017-11-26 MED ORDER — BISOPROLOL FUMARATE 5 MG PO TABS: 7.5000 mg | ORAL_TABLET | Freq: Every day | ORAL | 6 refills | Status: DC

## 2017-11-26 MED ORDER — SPIRONOLACTONE 25 MG PO TABS: 12.5000 mg | ORAL_TABLET | Freq: Every day | ORAL | 5 refills | Status: DC

## 2017-11-26 NOTE — Telephone Encounter (Signed)
Notes recorded by Shirley Muscat, RN on 11/26/2017 at 5:04 PM EDT Pt has been taking Multivitamin....will hold for now, Pt aware of results, agreeable to med changes (changes made in [facility]), and lab appt already made  ------  Notes recorded by Larey Dresser, MD on 11/26/2017 at 1:47 PM EDT K is high. Stop any K supplement. Do not have her increase spironolactone. Instead, increase bisoprolol to 7.5 mg daily. Have her follow low K diet. Repeat K next week early.

## 2017-11-26 NOTE — Patient Instructions (Signed)
Increase Spironolactone 25 mg (1 tab) daily  Labs drawn today (if we do not call you, then your lab work was stable)   Your physician recommends that you return for lab work in: 10 days  You have been referred to Cardiac rehab (they will call you)   Your physician recommends that you schedule a follow-up appointment in: 3 months with Dr. Aundra Dubin

## 2017-11-27 NOTE — Progress Notes (Signed)
HF Cardiology: Dr Aundra Dubin  Pulmonologist: Dr Melvyn Novas.   HPI: Ms. 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   She had a 32 day-long admission (10/31-12/1/18) for acute hypoxic respiratory failure secondary to acute decompensated HF which was a new diagnosis at the time. She required intubation x2 and hospital course was complicated by MSSA pneumonia and RUE DVT.  LHC 06/16/16 with 95% stenosis of mid RCA and s/p DES. She was discharged to SNF on Entresto 49/51, Lasix 40 QD, spironolactone 25 QD, Plavix and Eliquis 5 BID. Weight 219 pounds at time of discharge.    Admitted 07/07/16 from SNF with presyncope and hypotension. HF meds held. Received IV fluids and antibiotics.   She has been doing well.  Weight stable.  Using oxygen at all times.  No dyspnea walking on flat ground.  No orthopnea/PND.  No chest pain.  SBP 110s-120s when she checks at home.    Labs (1/19): K 5.1, creatinine 1.1 Labs (2/19): K 4.5, creatinine 1.13, LDL 79, HDL 55, hgb 12.3  05/2017 LHC with DES to RCA (95% mid RCA stenosis).    06/14/17 Upper Extremity US-with non-occlusive DVT in right axillary vein. Noted around PICC line in right basilic vein, and short segment of SVT noted in the forearm within the cephalic vein. LUE negative  05/26/2017 ECHO EF 35-40% Grade I DD  Echo (2/19): EF 40-45%, mild LVH, RV normal  ROS: All systems negative except as listed in HPI, PMH and Problem List.  SH:  Social History   Socioeconomic History  . Marital status: Married    Spouse name: Not on file  . Number of children: Not on file  . Years of education: Not on file  . Highest education level: Not on file  Occupational History  . Not on file  Social Needs  . Financial resource strain: Not on file  . Food insecurity:    Worry: Not on file   Inability: Not on file  . Transportation needs:    Medical: Not on file    Non-medical: Not on file  Tobacco Use  . Smoking status: Former Smoker    Packs/day: 1.00    Years: 25.00    Pack years: 25.00    Types: Cigarettes    Last attempt to quit: 07/27/1982    Years since quitting: 35.3  . Smokeless tobacco: Never Used  Substance and Sexual Activity  . Alcohol use: Yes    Alcohol/week: 0.0 oz    Comment: wine-rare  . Drug use: No  . Sexual activity: Not Currently  Lifestyle  . Physical activity:    Days per week: Not on file    Minutes per session: Not on file  . Stress: Not on file  Relationships  . Social connections:    Talks on phone: Not on file    Gets together: Not on file    Attends religious service: Not on file    Active member of club or organization: Not on file    Attends meetings of clubs or organizations: Not on file    Relationship status: Not on file  . Intimate partner violence:    Fear of current or ex partner: Not on file    Emotionally abused: Not on file    Physically abused: Not on  file    Forced sexual activity: Not on file  Other Topics Concern  . Not on file  Social History Narrative  . Not on file    FH:  Family History  Problem Relation Age of Onset  . Stroke Mother   . Diabetes Mother 4  . Stroke Father   . Heart failure Father   . Breast cancer Maternal Grandmother   . Colon cancer Neg Hx     Past Medical History:  Diagnosis Date  . Allergy    allergic rhinitis  . Asthma   . Chronic bronchitis ([place])   . Coronary artery disease    cath January 2011 with DEs LAD and RCA  . Dyspnea   . Full dentures   . Gallstones   . GERD (gastroesophageal reflux disease)   . History of echocardiogram    Echo 5/17: mod LVH, EF 50-55%, ant-septal HK, Gr 1 DD, mod LAE //  b.  Echo 7/17: mild concentric LVH, EF 45-50%, inf-lat, inf, inf-septal HK, mild LAE  . History of nuclear stress test    Myoview 7/17: EF 48%, small mild apical defect,  no ischemia, low risk  . Hyperlipidemia   . Hypertension   . Myocardial infarction ([place])    subendocardial, initial episode, 2010 two stents placed  . Myopia   . 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 Nocturnal oxygen desaturation    o2 at night  . Obesity   . Osteoarthritis    knees, fingers, shoulders  . Overactive bladder   . Oxygen deficiency    uses oxygen all day  . Rash    and other non specific skin eruptions  . Retaining fluid    in ankles and feet  . Urinary incontinence   . Vertigo   . Wears glasses     Current Outpatient Medications  Medication Sig Dispense Refill  . acetaminophen (TYLENOL) 325 MG tablet Take 650 mg by mouth every 6 (six) hours as needed for mild pain. Take as needed per bottle     . albuterol (PROAIR HFA) 108 (90 Base) MCG/ACT inhaler INHALE 2 PUFFS BY MOUTH EVERY 4 HOURS AS NEEDED FOR WHEEZING OR FOR SHORTNESS OF BREATH 8 Inhaler 5  . aspirin 81 MG tablet Take 81 mg by mouth daily.    . chlorpheniramine (CHLOR-TRIMETON) 4 MG tablet Take 4 mg by mouth 2 (two) times daily as needed for allergies.    Marland Kitchen clopidogrel (PLAVIX) 75 MG tablet TAKE 1 TABLET BY MOUTH EVERY DAY 30 tablet 11  . cyanocobalamin (,VITAMIN B-12,) 1000 MCG/ML injection Inject 1,000 mcg into the muscle every 3 (three) months.    . ENTRESTO 49-51 MG TAKE 1 TABLET BY MOUTH TWICE A DAY 60 tablet 11  . EPIPEN 2-PAK 0.3 MG/0.3ML SOAJ injection INJECT 0.3 MLS (0.3 MG TOTAL) INTO THE MUSCLE ONCE AS NEEDED FOR ALLERGIC REACTION 2 Device 0  . esomeprazole (NEXIUM) 40 MG capsule Take 1 capsule (40 mg total) by mouth daily. 30 capsule 0  . ezetimibe (ZETIA) 10 MG tablet TAKE 1 TABLET (10 MG TOTAL) BY MOUTH DAILY. 30 tablet 0  . furosemide (LASIX) 20 MG tablet Take 1 tablet (20 mg total) by mouth daily. 39 tablet 9  . insulin glargine (LANTUS) 100 UNIT/ML injection Inject 0.22 mLs (22 Units total) into the skin daily. 10 mL 5  . Multiple Vitamin (MULTIVITAMIN) capsule Take  1 capsule by mouth daily. 30 capsule 0  . OXYGEN  24/7 2 lpm  [facility]    . pravastatin (PRAVACHOL) 40 MG tablet TAKE 1 TABLET BY MOUTH EVERY DAY AT 6PM 30 tablet 11  . predniSONE (DELTASONE) 10 MG tablet Take 10mg  every other day alternating with 5mg  every other day    . ranitidine (ZANTAC) 75 MG tablet Take 4 tablets (300 mg total) by mouth at bedtime. 30 tablet 0  . bisoprolol (ZEBETA) 5 MG tablet Take 1.5 tablets (7.5 mg total) by mouth daily. 30 tablet 6  . spironolactone (ALDACTONE) 25 MG tablet Take 0.5 tablets (12.5 mg total) by mouth daily. 15 tablet 5   No current facility-administered medications for this encounter.     Vitals:   11/26/17 1003  BP: 138/60  Pulse: 65  SpO2: 98%  Weight: 235 lb (106.6 kg)    PHYSICAL EXAM: General: NAD Neck: Thick, no JVD, no thyromegaly or thyroid nodule.  Lungs: Clear to auscultation bilaterally with normal respiratory effort. CV: Nondisplaced PMI.  Heart regular S1/S2, no S3/S4, no murmur.  No peripheral edema.  No carotid bruit.  Normal pedal pulses.  Abdomen: Soft, nontender, no hepatosplenomegaly, no distention.  Skin: Intact without lesions or rashes.  Neurologic: Alert and oriented x 3.  Psych: Normal affect. Extremities: No clubbing or cyanosis.  HEENT: Normal.   ASSESSMENT & PLAN: 1. Chronic systolic CHF: Ischemic cardiomyopathy.  Last echo in 2/19 with EF up to 40-45%.  NYHA class II symptoms.  She is not volume overloaded.  Much of residual dyspnea is likely due to chronic BOOP.   - Increase spironolactone to 25 mg daily, BMET today and again in 10 days.   - Continue Entresto 49/51 bid.  - Continue bisoprolol 5 mg daily.  - Continue Lasix 20 mg daily, repeat BMET today.  - She is currently out of ICD range.  2. CAD: Most recently had DES to [facility] in 11/18.  No chest pain.   - Continue ASA 81 and Plavix 75.   - Continue statin/Zetia, lipids ok in 2/19.   - Still needs to do cardiac rehab.   3 H/O AKI: Check BMET today. 4.  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  5. RUE DVT: In 11/18, related to PICC.  Now off Eliquis.  Loralie Champagne 11/27/2017

## 2017-12-02 ENCOUNTER — Telehealth (HOSPITAL_COMMUNITY): Payer: Self-pay

## 2017-12-02 ENCOUNTER — Encounter (HOSPITAL_COMMUNITY): Payer: Self-pay | Admitting: Cardiology

## 2017-12-02 MED ORDER — BISOPROLOL FUMARATE 5 MG PO TABS: 7.5000 mg | ORAL_TABLET | Freq: Every day | ORAL | 6 refills | Status: DC

## 2017-12-02 MED FILL — BISOPROLOL FUMARATE 5 MG TA: 5 | 30 days supply | Qty: 45 | Fill #0

## 2017-12-02 NOTE — Telephone Encounter (Signed)
From [facility] chart Pt needed new Rx b/c [facility] does not carry bisoprolol. Sent to [facility].

## 2017-12-06 ENCOUNTER — Ambulatory Visit (HOSPITAL_COMMUNITY)
Admission: RE | Admit: 2017-12-06 | Discharge: 2017-12-06 | Disposition: A | Discharge status: Home / Self Care | Payer: Medicare Other | Source: Ambulatory Visit | Attending: Cardiology | Admitting: Cardiology

## 2017-12-06 DIAGNOSIS — I5042 Chronic combined systolic (congestive) and diastolic (congestive) heart failure: Secondary | ICD-10-CM | POA: Diagnosis not present

## 2017-12-06 LAB — BASIC METABOLIC PANEL
Anion gap: 8 (ref 5–15)
BUN: 26 mg/dL — AB (ref 6–20)
CO2: 31 mmol/L (ref 22–32)
Calcium: 9.7 mg/dL (ref 8.9–10.3)
Chloride: 104 mmol/L (ref 101–111)
Creatinine, Ser: 1.23 mg/dL — ABNORMAL HIGH (ref 0.44–1.00)
GFR calc Af Amer: 48 mL/min — ABNORMAL LOW (ref >60)
GFR, EST NON AFRICAN AMERICAN: 41 mL/min — AB (ref >60)
GLUCOSE: 102 mg/dL — AB (ref 65–99)
POTASSIUM: 4.4 mmol/L (ref 3.5–5.1)
Sodium: 143 mmol/L (ref 135–145)

## 2017-12-13 DIAGNOSIS — R0902 Hypoxemia: Secondary | ICD-10-CM | POA: Diagnosis not present

## 2017-12-21 ENCOUNTER — Ambulatory Visit: Payer: Medicare Other | Admitting: Psychology

## 2017-12-21 DIAGNOSIS — F4323 Adjustment disorder with mixed anxiety and depressed mood: Secondary | ICD-10-CM

## 2017-12-31 ENCOUNTER — Other Ambulatory Visit (INDEPENDENT_AMBULATORY_CARE_PROVIDER_SITE_OTHER): Payer: Medicare Other

## 2017-12-31 DIAGNOSIS — R7309 Other abnormal glucose: Secondary | ICD-10-CM

## 2017-12-31 LAB — BASIC METABOLIC PANEL
BUN: 30 mg/dL — ABNORMAL HIGH (ref 6–23)
CHLORIDE: 103 meq/L (ref 96–112)
CO2: 32 meq/L (ref 19–32)
Calcium: 9.6 mg/dL (ref 8.4–10.5)
Creatinine, Ser: 1.12 mg/dL (ref 0.40–1.20)
GFR: 50.02 mL/min — ABNORMAL LOW (ref >60.00)
Glucose, Bld: 103 mg/dL — ABNORMAL HIGH (ref 70–99)
POTASSIUM: 4.7 meq/L (ref 3.5–5.1)
Sodium: 143 mEq/L (ref 135–145)

## 2017-12-31 LAB — HEMOGLOBIN A1C: HEMOGLOBIN A1C: 5.4 % (ref 4.6–6.5)

## 2018-01-03 ENCOUNTER — Encounter: Payer: Self-pay | Admitting: Family Medicine

## 2018-01-03 ENCOUNTER — Ambulatory Visit (INDEPENDENT_AMBULATORY_CARE_PROVIDER_SITE_OTHER): Payer: Medicare Other | Admitting: Family Medicine

## 2018-01-03 VITALS — BP 124/68 | HR 67 | Temp 97.8°F | Ht 64.75 in | Wt 235.2 lb

## 2018-01-03 DIAGNOSIS — I10 Essential (primary) hypertension: Secondary | ICD-10-CM | POA: Diagnosis not present

## 2018-01-03 DIAGNOSIS — E1169 Type 2 diabetes mellitus with other specified complication: Secondary | ICD-10-CM | POA: Diagnosis not present

## 2018-01-03 DIAGNOSIS — R490 Dysphonia: Secondary | ICD-10-CM | POA: Diagnosis not present

## 2018-01-03 DIAGNOSIS — E669 Obesity, unspecified: Secondary | ICD-10-CM

## 2018-01-03 DIAGNOSIS — E785 Hyperlipidemia, unspecified: Secondary | ICD-10-CM | POA: Diagnosis not present

## 2018-01-03 DIAGNOSIS — E538 Deficiency of other specified B group vitamins: Secondary | ICD-10-CM | POA: Diagnosis not present

## 2018-01-03 MED ORDER — EPINEPHRINE 0.3 MG/0.3ML IJ SOAJ: INTRAMUSCULAR | 1 refills | Status: AC

## 2018-01-03 MED ORDER — CYANOCOBALAMIN 1000 MCG/ML IJ SOLN
1000.0000 ug | Freq: Once | INTRAMUSCULAR | Status: AC
  Administered 2018-01-03: 1000 ug via INTRAMUSCULAR

## 2018-01-03 MED FILL — BISOPROLOL FUMARATE 5 MG TA: 5 | 30 days supply | Qty: 45 | Fill #1

## 2018-01-03 NOTE — Assessment & Plan Note (Signed)
B12 shot today -early but for convenience Continue Q 3 mo

## 2018-01-03 NOTE — Assessment & Plan Note (Signed)
Ever since her long hospitalization and intubation  Keeping her from singing Also has BOOP and CHF On nexium daily -no gerd c/o Ref to ENT for eval  Pt is also interested in speech therapy if it would be helpful

## 2018-01-03 NOTE — Patient Instructions (Addendum)
We will refer you to ENT for hoarse voice   Keep eating a healthy diet  Also continue water exercise  B12 shot today (it is a bit early but that is ok)    Follow up for annual exam in 6 months    Take care of yourself

## 2018-01-03 NOTE — Assessment & Plan Note (Signed)
bp in fair control at this time  BP Readings from Last 1 Encounters:  01/03/18 124/68   No changes needed Most recent labs reviewed  Disc lifstyle change with low sodium diet and exercise

## 2018-01-03 NOTE — Progress Notes (Signed)
Subjective:    Patient ID: Kelli Velasquez, female    DOB: 1941/07/02, 77 y.o.   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  Pulse ox 91% today Breathing is overall ok and pulse ox good at home  F/u planned soon- gradually trying to get down on prednisone    Lab Results  Component Value Date   CREATININE 1.12 12/31/2017   BUN 30 (H) 12/31/2017   NA 143 12/31/2017   K 4.7 12/31/2017   CL 103 12/31/2017   CO2 32 12/31/2017   Lipids Lab Results  Component Value Date   CHOL 172 08/27/2017   HDL 55 08/27/2017   LDLCALC 79 08/27/2017   LDLDIRECT 124.8 09/01/2007   TRIG 188 (H) 08/27/2017   CHOLHDL 3.1 08/27/2017    bp is stable today  No cp or palpitations or headaches or edema  No side effects to medicines  BP Readings from Last 3 Encounters:  01/03/18 124/68  11/26/17 138/60  11/15/17 124/72    At home- bp are usually 120s/130s/60s occ higher  Pulse in 60s-70s    DM 2 Lab Results  Component Value Date   HGBA1C 5.4 12/31/2017  has to take prednisone 10 mg alt with 5 for 2 d  lantus 22 u  No hypoglycemia - reviewed glucose results today No sweets  May eat too much fruit  Now starting exercise  Glucose levels are pretty good- stable fasting- then more labile    Needs refill of epi pen   Throat and voice - very husky- cannot sing anymore  Is interested in some speech tx  Thinks it was due to multiple intubations   B12 def-would like to get her shot while she is here     Patient Active Problem List   Diagnosis Date Noted  . Hoarseness of voice 01/03/2018  . Coronary artery disease 07/07/2017  . Hypertension 07/07/2017  . Chronic systolic heart failure ([place]) 07/07/2017  . Abnormal urinalysis 07/07/2017  . Acute deep vein thrombosis (DVT) of right upper extremity ([place]) 07/07/2017  . Diabetes mellitus type 2 in obese ([place]) 07/07/2017  . 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 Hypoxemia   . Unstable angina ([place])   . COPD exacerbation ([place])   . Acute on chronic systolic congestive heart failure ([place])   . Encounter for screening mammogram for breast cancer 02/02/2017  . Chronic combined systolic and diastolic CHF, NYHA class 2 ([place]) 02/02/2017  . Depressed mood 12/02/2016  . Routine general medical examination at a health care facility 12/30/2015  . Supraumbilical hernia 61/60/7371  . Lump in the abdomen 04/10/2015  . GERD (gastroesophageal reflux disease) 10/10/2014  . B12 deficiency 10/09/2014  . Cataracts, bilateral 10/09/2014  . Dry eyes 10/09/2014  . Fatigue 05/08/2014  . Pedal edema 05/08/2014  . Osteopenia 11/28/2013  . Estrogen deficiency 11/02/2013  . Dyspnea on  exertion 06/27/2013  . Hernia, incisional 04/03/2013  . 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 Cough 12/18/2012  . Chronic respiratory failure with hypoxia and hypercapnia ([place]) 10/26/2012  . Special screening for malignant neoplasms, colon 06/16/2011  . DYSPHONIA 10/10/2010  . CAD, NATIVE VESSEL 08/19/2009  . 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 Morbid obesity due to excess calories ([place]) comp by hbp/ hyperlipidemia/ dm/ihd 09/01/2007  . MYOPIA 03/03/2007  . Hyperlipidemia 10/15/2006  . Essential hypertension 10/15/2006  . Allergic rhinitis 10/15/2006  . Cough variant asthma vs UACS  10/15/2006  . OVERACTIVE BLADDER 10/15/2006  . OSTEOARTHRITIS 10/15/2006  . Urinary incontinence 10/15/2006   Past  Medical History:  Diagnosis Date  . Allergy    allergic rhinitis  . Asthma   . Chronic bronchitis ([place])   . Coronary artery disease    cath January 2011 with DEs LAD and RCA  . Dyspnea   . Full dentures   . Gallstones   . GERD (gastroesophageal reflux disease)   . History of echocardiogram    Echo 5/17: mod LVH, EF 50-55%, ant-septal HK, Gr 1 DD, mod LAE //  b.  Echo 7/17: mild concentric LVH, EF 45-50%, inf-lat, inf, inf-septal HK, mild LAE  . History of nuclear stress test    Myoview 7/17: EF 48%, small mild apical defect, no ischemia, low risk  . Hyperlipidemia   . Hypertension   . Myocardial infarction ([place])    subendocardial, initial episode, 2010 two stents placed  . Myopia   . 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 Nocturnal oxygen desaturation    o2 at night  . Obesity   . Osteoarthritis    knees, fingers, shoulders  . Overactive bladder   . Oxygen deficiency    uses oxygen all day  . Rash    and other non specific skin eruptions  . Retaining fluid    in ankles and feet  . Urinary incontinence   . Vertigo   . Wears glasses    Past Surgical History:  Procedure Laterality Date  . CATARACT EXTRACTION W/PHACO Right 12/16/2016   Procedure: CATARACT EXTRACTION PHACO AND INTRAOCULAR LENS PLACEMENT ([place])  right;  Surgeon: Leandrew Koyanagi, MD;  Location: [facility];  Service: Ophthalmology;  Laterality: Right;  . CATARACT EXTRACTION W/PHACO Left 02/03/2017   Procedure: CATARACT EXTRACTION PHACO AND INTRAOCULAR LENS PLACEMENT ([place])  Left  Complicated;  Surgeon: Leandrew Koyanagi, MD;  Location: [facility];  Service: Ophthalmology;  Laterality: Left;  [place] Uses oxygen   . CHOLECYSTECTOMY  92  . COLONOSCOPY    . CORONARY ANGIOGRAPHY N/A 06/16/2017   Procedure: CORONARY ANGIOGRAPHY;  Surgeon: Larey Dresser, MD;  Location: [facility];  Service: Cardiovascular;  Laterality: N/A;  . CORONARY ANGIOPLASTY WITH  STENT PLACEMENT  07/2009   stent  . CORONARY STENT INTERVENTION N/A 06/16/2017   Procedure: CORONARY STENT INTERVENTION;  Surgeon: Wellington Hampshire, MD;  Location: [facility];  Service: Cardiovascular;  Laterality: N/A;  . DILATION AND CURETTAGE OF UTERUS  1984  . INCISIONAL HERNIA REPAIR N/A 05/16/2013   Procedure: HERNIA REPAIR INFRAUMILICAL INCISIONAL;  Surgeon: Joyice Faster. Cornett, MD;  Location: [facility];  Service: General;  Laterality: N/A;  umbilical  . INSERTION OF MESH N/A 05/16/2013   Procedure: INSERTION OF MESH;  Surgeon: Joyice Faster. Cornett, MD;  Location: [facility];  Service: General;  Laterality: N/A;  umbilical  . JOINT REPLACEMENT  2007   right total knee replacement  . RIGHT/LEFT HEART CATH AND CORONARY ANGIOGRAPHY N/A 06/16/2017   Procedure: RIGHT/LEFT HEART CATH AND CORONARY ANGIOGRAPHY;  Surgeon: Larey Dresser, MD;  Location: [facility];  Service: Cardiovascular;  Laterality: N/A;  . TOTAL KNEE ARTHROPLASTY  2010   left , then vocal cord infection post op  . VIDEO BRONCHOSCOPY Bilateral 07/05/2013   Procedure: VIDEO BRONCHOSCOPY WITH FLUORO;  Surgeon: Tanda Rockers, MD;  Location: [facility];  Service: Cardiopulmonary;  Laterality: Bilateral;  . vocal cord polypectomy     Social History   Tobacco Use  . Smoking status: Former Smoker    Packs/day: 1.00    Years: 25.00    Pack years: 25.00    Types: Cigarettes    Last attempt to quit: 07/27/1982    Years since quitting: 35.4  . Smokeless tobacco: Never Used  Substance Use Topics  . Alcohol use: Yes    Alcohol/week: 0.0 oz    Comment: wine-rare  . Drug use: No   Family History  Problem Relation Age of Onset  . Stroke Mother   . Diabetes Mother 68  . Stroke Father   . Heart failure Father   . Breast cancer Maternal Grandmother   . Colon cancer Neg Hx    Allergies  Allergen Reactions  . Fish Allergy Anaphylaxis  . Shellfish Allergy Anaphylaxis  .  Aspirin     REACTION: nausea and vomiting High doses  . Atorvastatin     REACTION: leg pain  . Crestor [Rosuvastatin Calcium] Other (See Comments)    Muscle pain - allergy/intolerance  . Penicillins     Due to mold allergy per pt  . Simvastatin     REACTION: muscle pain  . Trandolapril     REACTION: leg pain   Current Outpatient Medications on File Prior to Visit  Medication Sig Dispense Refill  . acetaminophen (TYLENOL) 325 MG tablet Take 650 mg by mouth every 6 (six) hours as needed for mild pain. Take as needed per bottle     . albuterol (PROAIR HFA) 108 (90 Base) MCG/ACT inhaler INHALE 2 PUFFS BY MOUTH EVERY 4 HOURS AS NEEDED FOR WHEEZING OR FOR SHORTNESS OF BREATH 8 Inhaler 5  . aspirin 81 MG tablet Take 81 mg by mouth daily.    . bisoprolol (ZEBETA) 5 MG tablet Take 1.5 tablets (7.5 mg total) by mouth daily. 45 tablet 6  . chlorpheniramine (CHLOR-TRIMETON) 4 MG tablet Take 4 mg by mouth 2 (two) times daily as needed for allergies.    [place] Kitchen clopidogrel (PLAVIX) 75 MG tablet TAKE 1 TABLET BY MOUTH EVERY DAY 30 tablet 11  . cyanocobalamin (,VITAMIN B-12,) 1000 MCG/ML injection Inject 1,000 mcg into the muscle every 3 (three) months.    . ENTRESTO 49-51 MG TAKE 1 TABLET BY MOUTH TWICE A DAY 60 tablet 11  . esomeprazole (NEXIUM) 40 MG capsule Take 1 capsule (40 mg total) by mouth daily. 30 capsule 0  . ezetimibe (ZETIA) 10 MG tablet TAKE 1 TABLET (10 MG TOTAL) BY MOUTH DAILY. 30 tablet 0  . furosemide (LASIX) 20 MG tablet Take 1 tablet (20 mg total) by mouth daily. 39 tablet 9  . insulin glargine (LANTUS) 100 UNIT/ML injection Inject 0.22 mLs (22 Units total) into the skin daily. 10 mL 5  . OXYGEN 24/7 2 lpm  [employer]    . pravastatin (PRAVACHOL) 40 MG tablet TAKE 1 TABLET BY MOUTH EVERY  DAY AT 6PM 30 tablet 11  . predniSONE (DELTASONE) 10 MG tablet Take 10mg  every other day alternating with 5mg  every other day    . ranitidine (ZANTAC) 75 MG tablet Take 4 tablets (300 mg total) by mouth at  bedtime. 30 tablet 0  . spironolactone (ALDACTONE) 25 MG tablet Take 0.5 tablets (12.5 mg total) by mouth daily. 15 tablet 5   No current facility-administered medications on file prior to visit.      Review of Systems  Constitutional: Positive for fatigue. Negative for activity change, appetite change, fever and unexpected weight change.  HENT: Positive for voice change. Negative for congestion, ear pain, rhinorrhea, sinus pressure and sore throat.   Eyes: Negative for pain, redness and visual disturbance.  Respiratory: Positive for shortness of breath. Negative for cough and wheezing.   Cardiovascular: Negative for chest pain and palpitations.       Leg swelling is better   Gastrointestinal: Negative for abdominal pain, blood in stool, constipation and diarrhea.  Endocrine: Negative for polydipsia and polyuria.  Genitourinary: Negative for dysuria, frequency and urgency.  Musculoskeletal: Negative for arthralgias, back pain and myalgias.  Skin: Negative for pallor and rash.  Allergic/Immunologic: Negative for environmental allergies.  Neurological: Negative for dizziness, syncope and headaches.  Hematological: Negative for adenopathy. Does not bruise/bleed easily.  Psychiatric/Behavioral: Negative for decreased concentration and dysphoric mood. The patient is not nervous/anxious.        Objective:   Physical Exam  Constitutional: She appears well-developed and well-nourished. No distress.  obese and well appearing  Wearing 02 by [place]    HENT:  Head: Normocephalic and atraumatic.  Mouth/Throat: Oropharynx is clear and moist.  Voice is hoarse  Eyes: Pupils are equal, round, and reactive to light. Conjunctivae and EOM are normal.  Neck: Normal range of motion. Neck supple. No JVD present. Carotid bruit is not present. No thyromegaly present.  Cardiovascular: Normal rate, regular rhythm, normal heart sounds and intact distal pulses. Exam reveals no gallop.  Pulmonary/Chest: Effort  normal and breath sounds normal. No stridor. No respiratory distress. She has no wheezes. She has no rales.  No crackles Distant bs at bases Scant wheeze on forced exp only Not sob at rest  02 by [place]  Abdominal: Soft. Bowel sounds are normal. She exhibits no distension, no abdominal bruit and no mass. There is no tenderness.  Baseline hernia above umbilicus   Musculoskeletal: She exhibits no edema.  Lymphadenopathy:    She has no cervical adenopathy.  Neurological: She is alert. She has normal reflexes. She displays normal reflexes. No cranial nerve deficit. Coordination normal.  Skin: Skin is warm and dry. No rash noted.  Psychiatric: She has a normal mood and affect.  pleasant          Assessment & Plan:   Problem List Items Addressed This Visit      Cardiovascular and Mediastinum   Essential hypertension    bp in fair control at this time  BP Readings from Last 1 Encounters:  01/03/18 124/68   No changes needed Most recent labs reviewed  Disc lifstyle change with low sodium diet and exercise        Relevant Medications   EPINEPHrine (EPIPEN 2-PAK) 0.3 mg/0.3 mL IJ SOAJ injection     Endocrine   Diabetes mellitus type 2 in obese ([place]) - Primary    In pt on low dose prednisone who is also obese  Lab Results  Component Value Date   HGBA1C 5.4 12/31/2017  Very well controlled with lantus  No hypoglycemia  Disc low glycemic diet  Also getting back to water exercise  Eye exam utd Disc foot care          Other   B12 deficiency    B12 shot today -early but for convenience Continue Q 3 mo        Relevant Medications   cyanocobalamin ((VITAMIN B-12)) injection 1,000 mcg (Completed)   Hoarseness of voice    Ever since her long hospitalization and intubation  Keeping her from singing Also has BOOP and CHF On nexium daily -no gerd c/o Ref to ENT for eval  Pt is also interested in speech therapy if it would be helpful      Relevant Orders   Ambulatory  referral to ENT   Hyperlipidemia    Disc goals for lipids and reasons to control them Rev last labs with pt Rev low sat fat diet in detail Well controlled with zetia and pravastatin        Relevant Medications   EPINEPHrine (EPIPEN 2-PAK) 0.3 mg/0.3 mL IJ SOAJ injection

## 2018-01-03 NOTE — Assessment & Plan Note (Signed)
Disc goals for lipids and reasons to control them Rev last labs with pt Rev low sat fat diet in detail Well controlled with zetia and pravastatin

## 2018-01-03 NOTE — Assessment & Plan Note (Signed)
In pt on low dose prednisone who is also obese  Lab Results  Component Value Date   HGBA1C 5.4 12/31/2017   Very well controlled with lantus  No hypoglycemia  Disc low glycemic diet  Also getting back to water exercise  Eye exam utd Disc foot care

## 2018-01-13 DIAGNOSIS — R0902 Hypoxemia: Secondary | ICD-10-CM | POA: Diagnosis not present

## 2018-01-19 DIAGNOSIS — K219 Gastro-esophageal reflux disease without esophagitis: Secondary | ICD-10-CM | POA: Diagnosis not present

## 2018-01-19 DIAGNOSIS — J370 Chronic laryngitis: Secondary | ICD-10-CM | POA: Diagnosis not present

## 2018-01-19 DIAGNOSIS — R490 Dysphonia: Secondary | ICD-10-CM | POA: Diagnosis not present

## 2018-01-25 ENCOUNTER — Ambulatory Visit: Payer: Medicare Other | Admitting: Psychology

## 2018-02-02 MED FILL — BISOPROLOL FUMARATE 5 MG TA: 5 | 30 days supply | Qty: 45 | Fill #2

## 2018-02-10 ENCOUNTER — Telehealth (HOSPITAL_COMMUNITY): Payer: Self-pay

## 2018-02-10 NOTE — Telephone Encounter (Signed)
Called patient to see if she was interested in participating in the Pulmonary Rehab Program. Patient stated not at this time, she is during water aerobics at her community pool.  Close referral

## 2018-02-12 DIAGNOSIS — R0902 Hypoxemia: Secondary | ICD-10-CM | POA: Diagnosis not present

## 2018-02-14 ENCOUNTER — Encounter: Payer: Self-pay | Admitting: Internal Medicine

## 2018-02-14 ENCOUNTER — Ambulatory Visit: Payer: Medicare Other | Admitting: Internal Medicine

## 2018-02-14 ENCOUNTER — Ambulatory Visit (INDEPENDENT_AMBULATORY_CARE_PROVIDER_SITE_OTHER)
Admission: RE | Admit: 2018-02-14 | Discharge: 2018-02-14 | Disposition: A | Discharge status: Home / Self Care | Payer: Medicare Other | Source: Ambulatory Visit | Attending: Internal Medicine | Admitting: Internal Medicine

## 2018-02-14 ENCOUNTER — Other Ambulatory Visit (INDEPENDENT_AMBULATORY_CARE_PROVIDER_SITE_OTHER): Payer: Medicare Other

## 2018-02-14 VITALS — BP 128/72 | HR 70 | Ht 64.75 in | Wt 234.0 lb

## 2018-02-14 DIAGNOSIS — J9611 Chronic respiratory failure with hypoxia: Secondary | ICD-10-CM | POA: Diagnosis not present

## 2018-02-14 DIAGNOSIS — R918 Other nonspecific abnormal finding of lung field: Secondary | ICD-10-CM

## 2018-02-14 DIAGNOSIS — J9612 Chronic respiratory failure with hypercapnia: Secondary | ICD-10-CM | POA: Diagnosis not present

## 2018-02-14 DIAGNOSIS — I517 Cardiomegaly: Secondary | ICD-10-CM | POA: Diagnosis not present

## 2018-02-14 LAB — SEDIMENTATION RATE: Sed Rate: 38 mm/hr — ABNORMAL HIGH (ref 0–30)

## 2018-02-14 MED ORDER — PREDNISONE 5 MG PO TABS: 5.0000 mg | ORAL_TABLET | Freq: Every day | ORAL | 0 refills | Status: DC

## 2018-02-14 NOTE — Progress Notes (Signed)
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    History of Present Illness  07/03/2013 f/u ov/Wert re: sob/cough/ wheeze/ MPN Chief Complaint  Patient presents with  . Follow-up    Pt had PET scan done this am. Her cough and SOB have improved since the last visit.   saba inhaler helps some/ min mucoid sputum, temp to 99 but no rigors, no purulent sputum or hemoptysis/ does have some wt. Loss, no sweating.  rec Dulera 100 Take 2 puffs first thing in am and then another 2 puffs about 12 hours later.  Prednisone 10 mg take  4 each am x 2 days,   2 each am x 2 days,  1 each am x 2 days and stop Only use your albuterol as a rescue medication  We will arrange 24/7 02 at 3lpm per advanced  We will call to arrange a biopsy of the easiest area once I discuss this with radiology  Late add: Discussed in detail all the  indications, usual  risks and alternatives  relative to the benefits with patient who agrees to proceed with bronchoscopy with biopsy.   FOB > done 07/05/2013 > nl airways > neg afb/fungus/path  - IR Bx 07/11/2013 > inflammatory, special stains pending but does not look like tb or fungus and responds short course pred per pt > rec prednisone x 6 days only and needs VATS bx but declines as of 07/13/2013 > rec'd final path from [facility] review > c/w BOOP though can't exclude asp/infection sources        11/20/2016 acute extended ov/Wert re:  BOOP/ pred 20/15 alternating and 2lpm 24/7 and increased to 3lpm to control Chief Complaint  Patient presents with  . Acute Visit    Pt c/o increased SOB, cough, and fatigue for the past 1-2 wks. Her cough is  non prod. She states she wakes up in the am very tired.    gradually sob /cough worse p eating / eats 10 pm, no candy handy and having lots of throat clearing/ sensation of daytime pnds / no better with saba  Doe now = MMRC3 = can't walk 100 yards even at a slow pace at a flat grade s stopping due to sob   rec Please remember to go to the lab and x-ray department downstairs in the basement  for your tests - we will call you with the results when they are available.  For drainage / throat tickle try take CHLORPHENIRAMINE  4 mg - take one every 4 hours as needed GERD  Keep your previous appt  Add :  Consider challenging with symbicort to see if helps cough/ systemic steroid dep    01/11/2017  f/u ov/Wert re:  BOOP/ steroid dep  -  No med calendar  Chief Complaint  Patient presents with  . Follow-up    3 month follow up. Patient states she is not feeling well. Feeling congested. Coughing. States she is not able to walk 87f on 2L of o2 before feeling out of breath.   gradually tapered pred from 20/15  Started feeling worse  when reduced pred  to 5 mg daily x 3 weeks prior to OV  And weak since. Bad chills x 2 x  1-2 days prior to OV  But s excess/ purulent sputum or mucus plugs  And none in last 24h rec Please remember to go to the lab and x-ray department downstairs in the basement  for your tests - we will call you with the results when they are available. Prednisone 10 mg daily until better or otherwise notified and then when better 10/5 alternating days     Admit date: 07/07/2017 Discharge date: 07/10/2017  Recommendations for Outpatient Follow-up:  Please note that lasix dose is now 20 mg a day due to soft blood pressure in hospital. Follow up with cardio per sch appt to make sure this dose is tolerated. Aldactone is 12.5 mg a day. Continue Cipro for 4 days on discharge for UTI.   Discharge Diagnoses:    Near syncope   Acute kidney injury [facility])   Coronary artery disease    Hypertension   Acute on chronic respiratory failure with hypoxia (HCC)   Chronic systolic heart failure (HCC)   Abnormal urinalysis   Dysphagia   Acute deep vein thrombosis (DVT) of right upper extremity (HCC)   Diabetes mellitus type 2 in obese (HCC)   Acute metabolic encephalopathy   Gluteal cleft wound    08/16/2017  Extended post hosp transition of care  f/u ov/Wert re:  Re-establish re BOOP steroid dep/ pred 10 mg daily plus new dx ischemic chf  Chief Complaint  Patient presents with  . Hospitalization Follow-up    Breathing is worse today which she relates to colder weather. She has not had to use her rescue inhaler.   doe = MMRC3 = can't walk 100 yards even at a slow pace at a flat grade s stopping due to sob  On up to 3lpm  Sleeping fine/ hoarse but not coughing. rec Try prednisone 10 mg on even days and 5 mg on odd if tolerated        02/14/2018  f/u ov/Wert re: boop on pred 10 -5 -5  Chief Complaint  Patient presents with  . Follow-up    Breathing is unchanged since the last visit. She rarely uses her albuterol.   Dyspnea:  MMRC3 = can't walk 100 yards even at a slow pace at a flat grade s stopping due to sob  eg has to stop when shopping at [facility] or [employer]/ water aerobics x 1 hour 3 x per week  Cough: not much    SABA use: rare 02: 2lpm at home, 3 pulsed when out    No obvious day to day or daytime variability or assoc excess/ purulent sputum or mucus plugs or hemoptysis or cp or chest tightness, subjective wheeze or overt sinus or hb symptoms.   Sleeping on 2lpm at 20 degrees hob  without nocturnal  or early am exacerbation  of respiratory  c/o's or need for noct saba. Also denies any obvious fluctuation of symptoms with weather or environmental changes or other aggravating or alleviating factors except as outlined above   No unusual exposure hx or h/o childhood pna/ asthma or knowledge of premature birth.  Current Allergies, Complete Past Medical History, Past  Surgical History, Family History, and Social History were reviewed in [employer] record.  ROS  The following are not active complaints unless bolded Hoarseness, sore throat, dysphagia, dental problems, itching, sneezing,  nasal congestion or discharge of  excess mucus or purulent secretions, ear ache,   fever, chills, sweats, unintended wt loss or wt gain, classically pleuritic or exertional cp,  orthopnea pnd or arm/hand swelling  or leg swelling, presyncope, palpitations, abdominal pain, anorexia, nausea, vomiting, diarrhea  or change in bowel habits or change in bladder habits, change in stools or change in urine, dysuria, hematuria,  rash, arthralgias, visual complaints, headache, numbness, weakness or ataxia or problems with walking or coordination,  change in mood or  memory.        Current Meds  Medication Sig  . acetaminophen (TYLENOL) 325 MG tablet Take 650 mg by mouth every 6 (six) hours as needed for mild pain. Take as needed per bottle   . albuterol (PROAIR HFA) 108 (90 Base) MCG/ACT inhaler INHALE 2 PUFFS BY MOUTH EVERY 4 HOURS AS NEEDED FOR WHEEZING OR FOR SHORTNESS OF BREATH  . aspirin 81 MG tablet Take 81 mg by mouth daily.  . bisoprolol (ZEBETA) 5 MG tablet Take 1.5 tablets (7.5 mg total) by mouth daily.  . chlorpheniramine (CHLOR-TRIMETON) 4 MG tablet Take 4 mg by mouth 2 (two) times daily as needed for allergies.  Marland Kitchen clopidogrel (PLAVIX) 75 MG tablet TAKE 1 TABLET BY MOUTH EVERY DAY  . cyanocobalamin (,VITAMIN B-12,) 1000 MCG/ML injection Inject 1,000 mcg into the muscle every 3 (three) months.  . ENTRESTO 49-51 MG TAKE 1 TABLET BY MOUTH TWICE A DAY  . EPINEPHrine (EPIPEN 2-PAK) 0.3 mg/0.3 mL IJ SOAJ injection INJECT 0.3 MLS (0.3 MG TOTAL) INTO THE MUSCLE ONCE AS NEEDED FOR ALLERGIC REACTION  . esomeprazole (NEXIUM) 40 MG capsule Take 1 capsule (40 mg total) by mouth daily.  Marland Kitchen ezetimibe (ZETIA) 10 MG tablet TAKE 1 TABLET (10 MG TOTAL) BY MOUTH DAILY.  .  furosemide (LASIX) 20 MG tablet Take 1 tablet (20 mg total) by mouth daily.  . insulin glargine (LANTUS) 100 UNIT/ML injection Inject 0.22 mLs (22 Units total) into the skin daily.  . OXYGEN 24/7 2 lpm  [employer]  . pravastatin (PRAVACHOL) 40 MG tablet TAKE 1 TABLET BY MOUTH EVERY DAY AT 6PM  . ranitidine (ZANTAC) 75 MG tablet Take 4 tablets (300 mg total) by mouth at bedtime.  Marland Kitchen spironolactone (ALDACTONE) 25 MG tablet Take 0.5 tablets (12.5 mg total) by mouth daily.  . [  predniSONE (DELTASONE) 10 MG tablet Take 10mg  every other day alternating with 5mg  every other day                   Objective:   Physical Exam 01/31/2013   237  > 07/03/2013  232 > 07/24/2013  231 > 09/06/2013  233 > 04/18/2014 256  10/18/2013  233 > 11/29/2013 238 >  03/07/2014  249 > 05/17/2014  263 > 06/26/2014 261>   10/01/2014  247 > 01/02/2015  253 >  04/16/2015 247 >  04/30/2015  244 > 06/14/2015   246 > 09/23/2015   255 > 11/04/2015 253  >  05/07/2016 259 > 06/23/2016   265  > 09/28/2016   247 > 11/20/2016  255 > 08/16/2017  231 > 11/15/2017    232 > 02/14/2018  234   amb obese wf nad     Vital signs reviewed - Note on arrival 02 sats  99% on 3lpm pulsed         HEENT: nl   turbinates bilaterally, and oropharynx. Nl external ear canals without cough reflex - upper and lower dentures    NECK :  without JVD/Nodes/TM/  nl carotid upstrokes bilaterally   LUNGS: no acc muscle use,  Nl contour chest which is clear to A and P bilaterally without cough on insp or exp maneuvers   CV:  RRR  no s3 or murmur or increase in P2, and no edema   ABD:  Obese/ soft and nontender with limited  inspiratory excursion in the supine position. No bruits or organomegaly appreciated, bowel sounds nl  MS:  Nl gait/ ext warm without deformities, calf tenderness, cyanosis or clubbing No obvious joint restrictions   SKIN: warm and dry without lesions    NEURO:  alert, approp, nl sensorium with  no motor or cerebellar deficits apparent.      CXR  PA and Lateral:   02/14/2018 :    I personally reviewed images and agree with radiology impression as follows:   No active cardiopulmonary disease. No evidence of pneumonia or pulmonary edema. Stable cardiomegaly.     Lab Results  Component Value Date   ESRSEDRATE 38 (H) 02/14/2018   ESRSEDRATE 20 11/15/2017   ESRSEDRATE 54 (H) 08/16/2017

## 2018-02-14 NOTE — Progress Notes (Signed)
Spoke with pt and notified of results per Dr. Wert. Pt verbalized understanding and denied any questions. 

## 2018-02-14 NOTE — Patient Instructions (Addendum)
Go ahead and reduce to Predisone  5 mg daily  X 2 weeks then 5/2.5 mg alternative days and leave there   Please remember to go to the lab and x-ray department downstairs in the basement  for your tests - we will call you with the results when they are available.      Please schedule a follow up visit in 3 months but call sooner if needed

## 2018-02-15 ENCOUNTER — Encounter: Payer: Self-pay | Admitting: Internal Medicine

## 2018-02-15 NOTE — Assessment & Plan Note (Signed)
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 > discussed with IR, rec FOB > done 07/05/2013 > nl airways > neg afb/fungus/path  - IR Bx 07/11/2013 > inflammatory, special stains pending but does not look like tb or fungus and responds short course pred per pt > rec prednisone x 6 days only and needs VATS bx but declines as of 07/13/2013 > rec'd final path from [facility] review > c/w BOOP though can't exclude asp/infection sources - 07/19/2013 notified pt and will return 07/24/13 for cxr/ esr then consider steroid rx (needs crypto serology also) - 07/24/2013 ESR 42 off pred x 3 weeks - 07/24/13 > crypto serology neg/ Anti-CCP neg  - started maint   prednisone 09/06/2013  >>> cxr cleared 10/18/2013 on 20 mg per day > rec taper to 20/10 - 11/28/13 reduced to 10 mg daily  - 03/07/2014 5 mg x 2 weeks then 5 mg qod x 2 weeks and stopped pred  04/10/14  - ESR 04/16/2015  = 24 / recurrent nodules > rec pred 20 mg daily > nodules resolved on f/u cxr 06/14/2015  - 04/30/2015 rec ceiling of 20 and floor of 10/5  - ESR 11/04/2015 =  23  On pred 10/5, rec taper off x 6 weeks if tol> flared in June 2017 so resumed last week in June 2017 20 ceiling/ 10/5 floor - ESR 05/07/2016   =  17 on prednisone 20 mg daily> rec 20/10 > did not tol      - ESR 11/20/2016  =  21 on pred 20/15 so rec 15 mg daily  - ESR 01/11/2017  =  46 with ? Mild  Flare on pred 5 so rec 10 until better then taper to floor of 10 /5  > did not tol - ESR 08/16/2017 =   54 but cxr / exam ok On pred 10 mg daily>  rec try 10/5 again with ceiling of 20 mg per day if worse  - ESR 02/14/2018  = 38 on 10-5-5 try taper to 5 a/w 2.5    The goal with a chronic steroid dependent illness is always arriving at the lowest effective dose that controls the disease/symptoms and not accepting a set "formula" which is based on statistics or guidelines that don't always take into  account patient  variability or the natural hx of the dz in every individual patient, which may well vary over time.  For now therefore I recommend the patient taper if tol to 5 a/w 2.5 / cautioned re looking for symptoms of both boop (cough [place] sob) and adrenal insuff (nausea/ anorexia/fatigue)

## 2018-02-15 NOTE — Assessment & Plan Note (Signed)
  11/24/2012 Office walk showed desats 86% on RA  With  spirometry FEV1 nml-73% , ratio 80   11/25/2012 CTA Chest >>neg for PE/ ILD  12/07/12 ONO > Pos 5: 43min  begin O2 2 l/m At bedtime  12/13/2012  >  Repeat 02/01/13 on 2lpm 27min> no change rx  - 01/31/2013  Walked RA x 3 laps @ 185 ft each stopped due to  End of study, no desats  - 07/03/2013 sats 75% RA >  On 3lpm sats 93%  > rx with 24 h 02 at 3lpm   - improved by self monitoring as of 07/24/13 ov so changed rx to 2lpm/24/7 humidified due to nasal dryness - 09/06/2013  Walked 2lpm x  2 laps @ 185 ft each stopped due to legs tired, no desats    - 04/18/2014  Walked RA  @ mod pace x 2 laps @ 185 ft each stopped due to  Hip pain, slow pace, sats 89% at end  - 05/16/2014  [facility] RA  2 laps @ 185 ft each stopped due to sob/fatigue but no desats off pred x 04/10/14  - 04/16/2015  Walked RA x 3 laps @ 185 ft each stopped due to   - ONO RA   10/05/2014 >  02 sat < 89% 213 min so rec resume 2lpm and work on wt loss   - 04/16/2015  Walked RA x 3 laps @ 185 ft each stopped due to  91% End of study, nl pace, no sob or desat - HCO3  01/06/16 = 35    - HCO3  05/07/2016  = 39 assoc with wt gain  - HCO3 01/11/2017     = 40  - HCO3 08/16/2017     =  35 - HC03  08/27/17            = 30    As of  02/14/2018    02 2lpm sleeping, resting/  3lpm with activity   Adequate control on present rx, reviewed in detail with pt > no change in rx needed     I had an extended discussion with the patient reviewing all relevant studies completed to date and  lasting 15 to 20 minutes of a 25 minute visit    Each maintenance medication was reviewed in detail including most importantly the difference between maintenance and prns and under what circumstances the prns are to be triggered using an action plan format that is not reflected in the computer generated alphabetically organized AVS.    Please see AVS for specific instructions unique to this visit that I personally wrote and  verbalized to the the pt in detail and then reviewed with pt  by my nurse highlighting any  changes in therapy recommended at today's visit to their plan of care.

## 2018-02-25 ENCOUNTER — Other Ambulatory Visit: Payer: Self-pay | Admitting: *Deleted

## 2018-02-25 MED ORDER — ESOMEPRAZOLE MAGNESIUM 40 MG PO CPDR: 40.0000 mg | DELAYED_RELEASE_CAPSULE | Freq: Every day | ORAL | 1 refills | Status: DC

## 2018-03-01 ENCOUNTER — Ambulatory Visit (HOSPITAL_COMMUNITY)
Admission: RE | Admit: 2018-03-01 | Discharge: 2018-03-01 | Disposition: A | Discharge status: Home / Self Care | Payer: Medicare Other | Source: Ambulatory Visit | Attending: Cardiology | Admitting: Cardiology

## 2018-03-01 ENCOUNTER — Other Ambulatory Visit: Payer: Self-pay | Admitting: *Deleted

## 2018-03-01 ENCOUNTER — Other Ambulatory Visit (HOSPITAL_COMMUNITY): Payer: Self-pay | Admitting: *Deleted

## 2018-03-01 VITALS — BP 116/49 | HR 67 | Wt 236.0 lb

## 2018-03-01 DIAGNOSIS — Z823 Family history of stroke: Secondary | ICD-10-CM | POA: Insufficient documentation

## 2018-03-01 DIAGNOSIS — Z09 Encounter for follow-up examination after completed treatment for conditions other than malignant neoplasm: Secondary | ICD-10-CM | POA: Insufficient documentation

## 2018-03-01 DIAGNOSIS — E785 Hyperlipidemia, unspecified: Secondary | ICD-10-CM | POA: Diagnosis not present

## 2018-03-01 DIAGNOSIS — E875 Hyperkalemia: Secondary | ICD-10-CM | POA: Insufficient documentation

## 2018-03-01 DIAGNOSIS — Z7951 Long term (current) use of inhaled steroids: Secondary | ICD-10-CM | POA: Diagnosis not present

## 2018-03-01 DIAGNOSIS — N179 Acute kidney failure, unspecified: Secondary | ICD-10-CM | POA: Diagnosis not present

## 2018-03-01 DIAGNOSIS — Z7952 Long term (current) use of systemic steroids: Secondary | ICD-10-CM | POA: Diagnosis not present

## 2018-03-01 DIAGNOSIS — Z8249 Family history of ischemic heart disease and other diseases of the circulatory system: Secondary | ICD-10-CM | POA: Insufficient documentation

## 2018-03-01 DIAGNOSIS — I110 Hypertensive heart disease with heart failure: Secondary | ICD-10-CM | POA: Diagnosis not present

## 2018-03-01 DIAGNOSIS — Z803 Family history of malignant neoplasm of breast: Secondary | ICD-10-CM | POA: Diagnosis not present

## 2018-03-01 DIAGNOSIS — I255 Ischemic cardiomyopathy: Secondary | ICD-10-CM | POA: Diagnosis not present

## 2018-03-01 DIAGNOSIS — I5042 Chronic combined systolic (congestive) and diastolic (congestive) heart failure: Secondary | ICD-10-CM | POA: Diagnosis not present

## 2018-03-01 DIAGNOSIS — Z7902 Long term (current) use of antithrombotics/antiplatelets: Secondary | ICD-10-CM | POA: Diagnosis not present

## 2018-03-01 DIAGNOSIS — Z7982 Long term (current) use of aspirin: Secondary | ICD-10-CM | POA: Insufficient documentation

## 2018-03-01 DIAGNOSIS — Z794 Long term (current) use of insulin: Secondary | ICD-10-CM | POA: Insufficient documentation

## 2018-03-01 DIAGNOSIS — I5022 Chronic systolic (congestive) heart failure: Secondary | ICD-10-CM | POA: Diagnosis not present

## 2018-03-01 DIAGNOSIS — J8489 Other specified interstitial pulmonary diseases: Secondary | ICD-10-CM | POA: Diagnosis not present

## 2018-03-01 DIAGNOSIS — Z86718 Personal history of other venous thrombosis and embolism: Secondary | ICD-10-CM | POA: Diagnosis not present

## 2018-03-01 DIAGNOSIS — K219 Gastro-esophageal reflux disease without esophagitis: Secondary | ICD-10-CM | POA: Insufficient documentation

## 2018-03-01 DIAGNOSIS — Z833 Family history of diabetes mellitus: Secondary | ICD-10-CM | POA: Diagnosis not present

## 2018-03-01 DIAGNOSIS — Z79899 Other long term (current) drug therapy: Secondary | ICD-10-CM | POA: Insufficient documentation

## 2018-03-01 DIAGNOSIS — Z87891 Personal history of nicotine dependence: Secondary | ICD-10-CM | POA: Diagnosis not present

## 2018-03-01 DIAGNOSIS — I2510 Atherosclerotic heart disease of native coronary artery without angina pectoris: Secondary | ICD-10-CM | POA: Diagnosis not present

## 2018-03-01 DIAGNOSIS — Z9981 Dependence on supplemental oxygen: Secondary | ICD-10-CM | POA: Insufficient documentation

## 2018-03-01 LAB — BASIC METABOLIC PANEL
ANION GAP: 7 (ref 5–15)
BUN: 29 mg/dL — ABNORMAL HIGH (ref 8–23)
CHLORIDE: 103 mmol/L (ref 98–111)
CO2: 31 mmol/L (ref 22–32)
CREATININE: 1.26 mg/dL — AB (ref 0.44–1.00)
Calcium: 9.4 mg/dL (ref 8.9–10.3)
GFR calc non Af Amer: 40 mL/min — ABNORMAL LOW (ref >60)
GFR, EST AFRICAN AMERICAN: 46 mL/min — AB (ref >60)
Glucose, Bld: 100 mg/dL — ABNORMAL HIGH (ref 70–99)
Potassium: 4.9 mmol/L (ref 3.5–5.1)
SODIUM: 141 mmol/L (ref 135–145)

## 2018-03-01 MED ORDER — BISOPROLOL FUMARATE 10 MG PO TABS: 10.0000 mg | ORAL_TABLET | Freq: Every day | ORAL | 3 refills | Status: DC

## 2018-03-01 MED ORDER — EZETIMIBE 10 MG PO TABS: ORAL_TABLET | ORAL | 1 refills | Status: DC

## 2018-03-01 MED FILL — BISOPROLOL FUMARATE 10 MG T: 10 | 90 days supply | Qty: 90 | Fill #0

## 2018-03-01 NOTE — Patient Instructions (Addendum)
Labs today (will call for abnormal results, otherwise no news is good news)  INCREASE Bisoprolol to 10 mg Once Daily.  Please call and cancel your appointment with Dr. Angelena Form per Dr. Aundra Dubin.  Follow up with Dr. Aundra Dubin in 4 months

## 2018-03-01 NOTE — Progress Notes (Signed)
HF Cardiology: Dr Aundra Dubin  Pulmonologist: Dr Melvyn Novas.   HPI: Kelli Velasquez is a 77 y.o. 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   She had a 32 day-long admission (10/31-12/1/18) for acute hypoxic respiratory failure secondary to acute decompensated HF which was a new diagnosis at the time. She required intubation x2 and hospital course was complicated by MSSA pneumonia and RUE DVT.  LHC 06/16/17 with 95% stenosis of mid RCA and s/p DES. She was discharged to SNF on Entresto 49/51, Lasix 40 QD, spironolactone 25 QD, Plavix and Eliquis 5 BID. Weight 219 pounds at time of discharge.    Admitted 07/07/17 from SNF with presyncope and hypotension. HF meds held. Received IV fluids and antibiotics.   She has been stable clinically.  Unable to increase spironolactone due to elevated K.  Main complaint currently is arthritis pain.  She is slowly tapering down on prednisone (for BOOP).  She continues on home oxygen.  She is stably short of breath walking 50-100 feet.  No PND, no lightheadedness.  No palpitations. She has been going to water aerobics, which she enjoys.  No chest pain.   Weight stable.   Labs (1/19): K 5.1, creatinine 1.1 Labs (2/19): K 4.5, creatinine 1.13, LDL 79, HDL 55, hgb 12.3  PMH: 1. BOOP: Chronic prednisone, home oxygen.  2. Recurrent UTIs 3. HTN 4. LUE DVT 11/18 associated with PICC.  5. CAD:  - 2011 DES to LAD and RCA.  - LHC (11/18) with 95% mid RCA stenosis => DES.  6. Chronic systolic CHF: Ischemic cardiomyopathy.   - Echo (10/18) with EF 35-40%.  - Echo (2/19) with EF 40-45%, mild LVH, normal RV.  7. GERD 8. Hyperlipidemia  ROS: All systems negative except as listed in HPI, PMH and Problem List.  Social History   Socioeconomic History  . Marital status: Married    Spouse name: Not on file  . Number of  children: Not on file  . Years of education: Not on file  . Highest education level: Not on file  Occupational History  . Not on file  Social Needs  . Financial resource strain: Not on file  . Food insecurity:    Worry: Not on file    Inability: Not on file  . Transportation needs:    Medical: Not on file    Non-medical: Not on file  Tobacco Use  . Smoking status: Former Smoker    Packs/day: 1.00    Years: 25.00    Pack years: 25.00    Types: Cigarettes    Last attempt to quit: 07/27/1982    Years since quitting: 35.6  . Smokeless tobacco: Never Used  Substance and Sexual Activity  . Alcohol use: Yes    Alcohol/week: 0.0 oz    Comment: wine-rare  . Drug use: No  . Sexual activity: Not Currently  Lifestyle  . Physical activity:    Days per week: Not on file    Minutes per session: Not on file  . Stress: Not on file  Relationships  . Social connections:    Talks on phone: Not on file    Gets together: Not on file    Attends religious service: Not on file    Active member of club or organization: Not on file    Attends meetings of  clubs or organizations: Not on file    Relationship status: Not on file  . Intimate partner violence:    Fear of current or ex partner: Not on file    Emotionally abused: Not on file    Physically abused: Not on file    Forced sexual activity: Not on file  Other Topics Concern  . Not on file  Social History Narrative  . Not on file   Family History  Problem Relation Age of Onset  . Stroke Mother   . Diabetes Mother 66  . Stroke Father   . Heart failure Father   . Breast cancer Maternal Grandmother   . Colon cancer Neg Hx     Current Outpatient Medications  Medication Sig Dispense Refill  . acetaminophen (TYLENOL) 325 MG tablet Take 650 mg by mouth every 6 (six) hours as needed for mild pain. Take as needed per bottle     . aspirin 81 MG tablet Take 81 mg by mouth daily.    . chlorpheniramine (CHLOR-TRIMETON) 4 MG tablet Take 4 mg  by mouth 2 (two) times daily as needed for allergies.    Marland Kitchen clopidogrel (PLAVIX) 75 MG tablet TAKE 1 TABLET BY MOUTH EVERY DAY 30 tablet 11  . cyanocobalamin (,VITAMIN B-12,) 1000 MCG/ML injection Inject 1,000 mcg into the muscle every 3 (three) months.    . ENTRESTO 49-51 MG TAKE 1 TABLET BY MOUTH TWICE A DAY 60 tablet 11  . EPINEPHrine (EPIPEN 2-PAK) 0.3 mg/0.3 mL IJ SOAJ injection INJECT 0.3 MLS (0.3 MG TOTAL) INTO THE MUSCLE ONCE AS NEEDED FOR ALLERGIC REACTION 2 Device 1  . esomeprazole (NEXIUM) 40 MG capsule Take 1 capsule (40 mg total) by mouth daily. 90 capsule 1  . furosemide (LASIX) 20 MG tablet Take 1 tablet (20 mg total) by mouth daily. 39 tablet 9  . insulin glargine (LANTUS) 100 UNIT/ML injection Inject 0.22 mLs (22 Units total) into the skin daily. 10 mL 5  . OXYGEN 24/7 2 lpm  [employer]    . pravastatin (PRAVACHOL) 40 MG tablet TAKE 1 TABLET BY MOUTH EVERY DAY AT 6PM 30 tablet 11  . predniSONE (DELTASONE) 5 MG tablet Take 1 tablet (5 mg total) by mouth daily with breakfast. 100 tablet 0  . ranitidine (ZANTAC) 75 MG tablet Take 4 tablets (300 mg total) by mouth at bedtime. 30 tablet 0  . spironolactone (ALDACTONE) 25 MG tablet Take 0.5 tablets (12.5 mg total) by mouth daily. 15 tablet 5  . albuterol (PROAIR HFA) 108 (90 Base) MCG/ACT inhaler INHALE 2 PUFFS BY MOUTH EVERY 4 HOURS AS NEEDED FOR WHEEZING OR FOR SHORTNESS OF BREATH (Patient not taking: Reported on 03/01/2018) 8 Inhaler 5  . bisoprolol (ZEBETA) 10 MG tablet Take 1 tablet (10 mg total) by mouth daily. 90 tablet 3  . ezetimibe (ZETIA) 10 MG tablet TAKE 1 TABLET (10 MG TOTAL) BY MOUTH DAILY. 90 tablet 1   No current facility-administered medications for this encounter.     Vitals:   03/01/18 1022  BP: (!) 116/49  Pulse: 67  SpO2: 98%  Weight: 236 lb (107 kg)    PHYSICAL EXAM: General: NAD Neck: No JVD, no thyromegaly or thyroid nodule.  Lungs: Clear to auscultation bilaterally with normal respiratory effort. CV:  Nondisplaced PMI.  Heart regular S1/S2, no S3/S4, no murmur.  No peripheral edema.  No carotid bruit.  Normal pedal pulses.  Abdomen: Soft, nontender, no hepatosplenomegaly, no distention.  Skin: Intact without lesions or rashes.  Neurologic:  Alert and oriented x 3.  Psych: Normal affect. Extremities: No clubbing or cyanosis.  HEENT: Normal.   ASSESSMENT & PLAN: 1. Chronic systolic CHF: Ischemic cardiomyopathy.  Last echo in 2/19 with EF up to 40-45%.  NYHA class III symptoms, chronic.  She is not volume overloaded.  Much of residual dyspnea is likely due to chronic BOOP.  She was unable to tolerate increased spironolactone due to hyperkalemia.  - Continue spironolactone 12.5 daily.   - Continue Entresto 49/51 bid.  - Increase bisoprolol to 10 mg daily.   - Continue Lasix 20 mg daily, repeat BMET today.  - She is currently out of ICD range.  2. CAD: Most recently had DES to [facility] in 11/18.  No chest pain.   - Continue ASA 81 and Plavix 75.   - Continue statin/Zetia, lipids ok in 2/19.     3 H/O AKI: Check BMET today. 4. 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  5. RUE DVT: In 11/18, related to PICC.  Now off Eliquis.  Followup 4 months.   Loralie Champagne 03/01/2018

## 2018-03-15 DIAGNOSIS — R0902 Hypoxemia: Secondary | ICD-10-CM | POA: Diagnosis not present

## 2018-03-29 ENCOUNTER — Telehealth (HOSPITAL_COMMUNITY): Payer: Self-pay | Admitting: Cardiology

## 2018-03-29 NOTE — Telephone Encounter (Signed)
With recent increase in bisoprolol patient reports increase in dizziness, increase in fatigue, unable to stay awake during the day, unbalanced and reports b/p has increased 142/64  Advised provider may decrease back to previous dose will return call after speaking with provider

## 2018-03-30 MED ORDER — BISOPROLOL FUMARATE 5 MG PO TABS: 7.5000 mg | ORAL_TABLET | Freq: Every day | ORAL | 6 refills | Status: DC

## 2018-03-30 NOTE — Telephone Encounter (Signed)
Pt reports she will decrease back to 7.5 mg daily

## 2018-03-30 NOTE — Telephone Encounter (Signed)
Please have her decrease bisoprolol back to 7.5 mg daily. Let us know if symptoms do not improve. Thanks

## 2018-04-15 DIAGNOSIS — R0902 Hypoxemia: Secondary | ICD-10-CM | POA: Diagnosis not present

## 2018-04-25 DIAGNOSIS — H04123 Dry eye syndrome of bilateral lacrimal glands: Secondary | ICD-10-CM | POA: Diagnosis not present

## 2018-05-08 ENCOUNTER — Other Ambulatory Visit: Payer: Self-pay | Admitting: Internal Medicine

## 2018-05-12 ENCOUNTER — Other Ambulatory Visit: Payer: Self-pay | Admitting: *Deleted

## 2018-05-12 MED ORDER — INSULIN GLARGINE 100 UNIT/ML ~~LOC~~ SOLN: 22.0000 [IU] | Freq: Every day | SUBCUTANEOUS | 5 refills | Status: DC

## 2018-05-15 DIAGNOSIS — R0902 Hypoxemia: Secondary | ICD-10-CM | POA: Diagnosis not present

## 2018-05-17 ENCOUNTER — Other Ambulatory Visit (INDEPENDENT_AMBULATORY_CARE_PROVIDER_SITE_OTHER): Payer: Medicare Other

## 2018-05-17 ENCOUNTER — Ambulatory Visit: Payer: Medicare Other | Admitting: Internal Medicine

## 2018-05-17 ENCOUNTER — Encounter: Payer: Self-pay | Admitting: Internal Medicine

## 2018-05-17 VITALS — BP 132/72 | HR 74 | Ht 65.5 in | Wt 242.6 lb

## 2018-05-17 DIAGNOSIS — R918 Other nonspecific abnormal finding of lung field: Secondary | ICD-10-CM

## 2018-05-17 DIAGNOSIS — J9612 Chronic respiratory failure with hypercapnia: Secondary | ICD-10-CM

## 2018-05-17 DIAGNOSIS — J9611 Chronic respiratory failure with hypoxia: Secondary | ICD-10-CM | POA: Diagnosis not present

## 2018-05-17 LAB — SEDIMENTATION RATE: SED RATE: 25 mm/h (ref 0–30)

## 2018-05-17 NOTE — Progress Notes (Signed)
Spoke with pt and notified of results per Dr. Wert. Pt verbalized understanding and denied any questions. 

## 2018-05-17 NOTE — Patient Instructions (Signed)
Please remember to go to the lab department downstairs in the basement  for your tests - we will call you with the results when they are available.      If you flare at lower prednisone doses we may need to send you to a rheumatologist   Monitor your saturations while on exertion several times a week (your bike is a good time to do this) to see if trending down as prednisone dose decreases   Please schedule a follow up visit in 3 months but call sooner if needed

## 2018-05-17 NOTE — Progress Notes (Signed)
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    History of Present Illness  07/03/2013 f/u ov/Kelli Velasquez re: sob/cough/ wheeze/ MPN Chief Complaint  Patient presents with  . Follow-up    Pt had PET scan done this am. Her cough and SOB have improved since the last visit.   saba inhaler helps some/ min mucoid sputum, temp to 99 but no rigors, no purulent sputum or hemoptysis/ does have some wt. Loss, no sweating.  rec Dulera 100 Take 2 puffs first thing in am and then another 2 puffs about 12 hours later.  Prednisone 10 mg take  4 each am x 2 days,   2 each am x 2 days,  1 each am x 2 days and stop Only use your albuterol as a rescue medication  We will arrange 24/7 02 at 3lpm per advanced  We will call to arrange a biopsy of the easiest area once I discuss this with radiology  Late add: Discussed in detail all the  indications, usual  risks and alternatives  relative to the benefits with patient who agrees to proceed with bronchoscopy with biopsy.   FOB > done 07/05/2013 > nl airways > neg afb/fungus/path  - IR Bx 07/11/2013 > inflammatory, special stains pending but does not look like tb or fungus and responds short course pred per pt > rec prednisone x 6 days only and needs VATS bx but declines as of 07/13/2013 > rec'd final path from [facility] review > c/w BOOP though can't exclude asp/infection sources        11/20/2016 acute extended ov/Kelli Velasquez re:  BOOP/ pred 20/15 alternating and 2lpm 24/7 and increased to 3lpm to control Chief Complaint  Patient presents with  . Acute Visit    Pt c/o increased SOB, cough, and fatigue for the past 1-2 wks. Her cough is  non prod. She states she wakes up in the am very tired.    gradually sob /cough worse p eating / eats 10 pm, no candy handy and having lots of throat clearing/ sensation of daytime pnds / no better with saba  Doe now = MMRC3 = can't walk 100 yards even at a slow pace at a flat grade s stopping due to sob   rec Please remember to go to the lab and x-ray department downstairs in the basement  for your tests - we will call you with the results when they are available.  For drainage / throat tickle try take CHLORPHENIRAMINE  4 mg - take one every 4 hours as needed GERD  Keep your previous appt  Add :  Consider challenging with symbicort to see if helps cough/ systemic steroid dep    01/11/2017  f/u ov/Kelli Velasquez re:  BOOP/ steroid dep  -  No med calendar  Chief Complaint  Patient presents with  . Follow-up    3 month follow up. Patient states she is not feeling well. Feeling congested. Coughing. States she is not able to walk 87f on 2L of o2 before feeling out of breath.   gradually tapered pred from 20/15  Started feeling worse  when reduced pred  to 5 mg daily x 3 weeks prior to OV  And weak since. Bad chills x 2 x  1-2 days prior to OV  But s excess/ purulent sputum or mucus plugs  And none in last 24h rec Please remember to go to the lab and x-ray department downstairs in the basement  for your tests - we will call you with the results when they are available. Prednisone 10 mg daily until better or otherwise notified and then when better 10/5 alternating days     Admit date: 07/07/2017 Discharge date: 07/10/2017  Recommendations for Outpatient Follow-up:  Please note that lasix dose is now 20 mg a day due to soft blood pressure in hospital. Follow up with cardio per sch appt to make sure this dose is tolerated. Aldactone is 12.5 mg a day. Continue Cipro for 4 days on discharge for UTI.   Discharge Diagnoses:    Near syncope   Acute kidney injury [facility])   Coronary artery disease    Hypertension   Acute on chronic respiratory failure with hypoxia (HCC)   Chronic systolic heart failure (HCC)   Abnormal urinalysis   Dysphagia   Acute deep vein thrombosis (DVT) of right upper extremity (HCC)   Diabetes mellitus type 2 in obese (HCC)   Acute metabolic encephalopathy   Gluteal cleft wound    08/16/2017  Extended post hosp transition of care  f/u ov/Kelli Velasquez re:  Re-establish re BOOP steroid dep/ pred 10 mg daily plus new dx ischemic chf  Chief Complaint  Patient presents with  . Hospitalization Follow-up    Breathing is worse today which she relates to colder weather. She has not had to use her rescue inhaler.   doe = MMRC3 = can't walk 100 yards even at a slow pace at a flat grade s stopping due to sob  On up to 3lpm  Sleeping fine/ hoarse but not coughing. rec Try prednisone 10 mg on even days and 5 mg on odd if tolerated        02/14/2018  f/u ov/Kelli Velasquez re: boop on pred 10 -5 -5  Chief Complaint  Patient presents with  . Follow-up    Breathing is unchanged since the last visit. She rarely uses her albuterol.   Dyspnea:  MMRC3 = can't walk 100 yards even at a slow pace at a flat grade s stopping due to sob  eg has to stop when shopping at [facility] or [employer]/ water aerobics x 1 hour 3 x per week  Cough: not much    SABA use: rare 02: 2lpm at home, 3 pulsed when out  rec Go ahead and reduce to Predisone  5 mg daily  X 2 weeks then 5/2.5 mg alternative days and leave there  Please remember to go to the lab and x-ray department downstairs in the basement  for your tests - we will call you with the results when they are available.   Please schedule a follow up visit in 3 months but call sooner if needed      05/17/2018  f/u ov/Kelli Velasquez re:   Boop/ steroid resp/? Dependent / never saw rheum  Chief Complaint  Patient presents with  . Follow-up    States her breathing has been up down, but albuterol has helped.    Dyspnea:  Chair bicycle 3 x wk x 30 min s stopping on 2lpm  but does not check sats with ex as rec  Cough: none Sleeping: 10-15 degrees  with one pillow SABA use: rarely  eg once or twice weekly  02: 2lpm cont at home/ 3lpm poc out     No obvious day to day or daytime variability or assoc excess/ purulent sputum or mucus plugs or hemoptysis or cp or chest tightness, subjective wheeze or overt sinus or hb symptoms.   Sleeping as above without nocturnal  or early am exacerbation  of respiratory  c/o's or need for noct saba. Also denies any obvious fluctuation of symptoms with weather or environmental changes or other aggravating or alleviating factors except as outlined above   No unusual exposure hx or h/o childhood pna/ asthma or knowledge of premature birth.  Current Allergies, Complete Past Medical History, Past Surgical History, Family History, and Social History were reviewed in [employer] record.  ROS  The following are not active complaints unless bolded Hoarseness, sore throat, dysphagia, dental problems, itching, sneezing,  nasal congestion or discharge of excess mucus or purulent secretions, ear ache,   fever, chills, sweats, unintended wt loss or wt gain, classically pleuritic or exertional cp,  Orthopnea,mild and chronic  pnd or arm/hand swelling  or leg swelling, presyncope, palpitations, abdominal pain, anorexia, nausea, vomiting, diarrhea  or change in bowel habits or change in bladder habits, change in stools or change in urine, dysuria, hematuria,  rash, arthralgias, visual complaints, headache, numbness, weakness or ataxia or problems with walking or coordination,  change in mood or  memory.        Current Meds  Medication Sig  . acetaminophen (TYLENOL) 325 MG tablet Take 650 mg by mouth every 6 (six) hours as needed for mild pain. Take as needed per bottle   . albuterol (PROAIR HFA) 108 (90 Base) MCG/ACT inhaler INHALE 2 PUFFS BY MOUTH EVERY 4 HOURS AS NEEDED FOR WHEEZING OR FOR SHORTNESS OF BREATH  . aspirin 81  MG tablet Take 81 mg by mouth daily.  . bisoprolol (ZEBETA) 5 MG tablet Take 1.5 tablets (7.5 mg total) by mouth daily.  . chlorpheniramine (CHLOR-TRIMETON) 4 MG tablet Take 4 mg by mouth 2 (two) times daily as needed for allergies.  Marland Kitchen clopidogrel (PLAVIX) 75 MG tablet TAKE 1 TABLET BY MOUTH EVERY DAY  . cyanocobalamin (,VITAMIN B-12,) 1000 MCG/ML injection Inject 1,000 mcg into the muscle every 3 (three) months.  . ENTRESTO 49-51 MG TAKE 1 TABLET BY MOUTH TWICE A DAY  . EPINEPHrine (EPIPEN 2-PAK) 0.3 mg/0.3 mL IJ SOAJ injection INJECT 0.3 MLS (0.3 MG TOTAL) INTO THE MUSCLE ONCE AS NEEDED FOR ALLERGIC REACTION  . esomeprazole (NEXIUM) 40 MG capsule Take 1 capsule (40 mg total) by mouth daily.  Marland Kitchen ezetimibe (ZETIA) 10 MG tablet TAKE 1 TABLET (10 MG TOTAL) BY MOUTH DAILY.  . furosemide (LASIX) 20 MG tablet Take 1 tablet (20 mg total) by mouth daily.  . insulin glargine (LANTUS) 100 UNIT/ML injection Inject 0.22 mLs (22 Units total) into the skin daily.  . OXYGEN 24/7 2 lpm  [employer]  . pravastatin (PRAVACHOL) 40 MG tablet TAKE 1 TABLET BY MOUTH EVERY DAY AT 6PM  . predniSONE (DELTASONE) 5 MG tablet TAKE 1 TABLET BY MOUTH EVERY DAY WITH BREAKFAST  . ranitidine (ZANTAC) 75 MG tablet Take 4 tablets (300 mg total) by mouth at bedtime.  Marland Kitchen spironolactone (ALDACTONE) 25 MG tablet Take 0.5 tablets (12.5 mg total) by mouth daily.               Objective:   Physical Exam 01/31/2013   237  >  07/03/2013  232 > 07/24/2013  231 > 09/06/2013  233 > 04/18/2014 256  10/18/2013  233 > 11/29/2013 238 >  03/07/2014  249 > 05/17/2014  263 > 06/26/2014 261>   10/01/2014  247 > 01/02/2015  253 >  04/16/2015 247 >  04/30/2015  244 > 06/14/2015   246 > 09/23/2015   255 > 11/04/2015 253  >  05/07/2016 259 > 06/23/2016   265  > 09/28/2016   247 > 11/20/2016  255 > 08/16/2017  231 > 11/15/2017    232 > 02/14/2018  234 > 05/17/2018   243   amb obese wf      Vital signs reviewed - Note on arrival 02 sats  96% on 3lpm       HEENT: nl  dentition, turbinates bilaterally, and oropharynx. Nl external ear canals without cough reflex   NECK :  without JVD/Nodes/TM/ nl carotid upstrokes bilaterally   LUNGS: no acc muscle use,  Nl contour chest with minimal bibasilar crackles  bilaterally without cough on insp or exp maneuvers   CV:  RRR  no s3 or murmur or increase in P2, and no edema   ABD:  Obese/ soft and nontender with nl inspiratory excursion in the supine position. No bruits or organomegaly appreciated, bowel sounds nl  MS:  Nl gait/ ext warm without deformities, calf tenderness, cyanosis or clubbing No obvious joint restrictions   SKIN: warm and dry without lesions    NEURO:  alert, approp, nl sensorium with  no motor or cerebellar deficits apparent.      Labs ordered/ reviewed:   Lab Results  Component Value Date   ESRSEDRATE 25 05/17/2018   ESRSEDRATE 38 (H) 02/14/2018   ESRSEDRATE 20 11/15/2017

## 2018-05-18 ENCOUNTER — Encounter: Payer: Self-pay | Admitting: Internal Medicine

## 2018-05-18 NOTE — Assessment & Plan Note (Signed)
11/24/2012 Office walk showed desats 86% on RA  With  spirometry FEV1 nml-73% , ratio 80   11/25/2012 CTA Chest >>neg for PE/ ILD  12/07/12 ONO > Pos 5: 71min  begin O2 2 l/m At bedtime  12/13/2012  >  Repeat 02/01/13 on 2lpm 75min> no change rx  - 01/31/2013  Walked RA x 3 laps @ 185 ft each stopped due to  End of study, no desats  - 07/03/2013 sats 75% RA >  On 3lpm sats 93%  > rx with 24 h 02 at 3lpm   - improved by self monitoring as of 07/24/13 ov so changed rx to 2lpm/24/7 humidified due to nasal dryness - 09/06/2013  Walked 2lpm x  2 laps @ 185 ft each stopped due to legs tired, no desats    - 04/18/2014  Walked RA  @ mod pace x 2 laps @ 185 ft each stopped due to  Hip pain, slow pace, sats 89% at end  - 05/16/2014  [facility] RA  2 laps @ 185 ft each stopped due to sob/fatigue but no desats off pred x 04/10/14  - 04/16/2015  Walked RA x 3 laps @ 185 ft each stopped due to   - ONO RA   10/05/2014 >  02 sat < 89% 213 min so rec resume 2lpm and work on wt loss   - 04/16/2015  Walked RA x 3 laps @ 185 ft each stopped due to  91% End of study, nl pace, no sob or desat - HCO3  01/06/16 = 35    - HCO3  05/07/2016  = 39 assoc with wt gain  - HCO3 01/11/2017     = 40  - HCO3 08/16/2017     =  35 - HC03  08/27/17            = 30  - HC03  03/24/18          = 29  - 05/17/2018   Walked 3lpm POC  x one lap @ 185 stopped due to  Back pain / sats still 92 nl pace  As of   05/17/2018   02 2lpm sleeping, resting/  3lpm with activity but advised to monitor sats while on bike and call if downward trend noted

## 2018-05-18 NOTE — Assessment & Plan Note (Addendum)
Body mass index is 39.76 kg/m.  -  trending up again  Lab Results  Component Value Date   TSH 1.452 06/15/2017     Contributing to gerd risk/ doe/reviewed the need and the process to achieve and maintain neg calorie balance > defer f/u primary care including intermittently monitoring thyroid status       I had an extended discussion with the patient reviewing all relevant studies completed to date and  lasting 15 to 20 minutes of a 25 minute visit    Each maintenance medication was reviewed in detail including most importantly the difference between maintenance and prns and under what circumstances the prns are to be triggered using an action plan format that is not reflected in the computer generated alphabetically organized AVS.     Please see AVS for specific instructions unique to this visit that I personally wrote and verbalized to the the pt in detail and then reviewed with pt  by my nurse highlighting any  changes in therapy recommended at today's visit to their plan of care.

## 2018-05-19 ENCOUNTER — Ambulatory Visit (INDEPENDENT_AMBULATORY_CARE_PROVIDER_SITE_OTHER): Payer: Medicare Other | Admitting: *Deleted

## 2018-05-19 DIAGNOSIS — Z23 Encounter for immunization: Secondary | ICD-10-CM | POA: Diagnosis not present

## 2018-05-19 DIAGNOSIS — E538 Deficiency of other specified B group vitamins: Secondary | ICD-10-CM | POA: Diagnosis not present

## 2018-05-19 MED ORDER — CYANOCOBALAMIN 1000 MCG/ML IJ SOLN
1000.0000 ug | Freq: Once | INTRAMUSCULAR | Status: AC
  Administered 2018-05-19: 1000 ug via INTRAMUSCULAR

## 2018-05-19 NOTE — Progress Notes (Signed)
Per orders of Dr. Duncan, injection of b12 given by Trew Sunde H. Patient tolerated injection well.  

## 2018-05-30 ENCOUNTER — Telehealth: Payer: Self-pay | Admitting: Family Medicine

## 2018-05-30 MED ORDER — PRAVASTATIN SODIUM 40 MG PO TABS: ORAL_TABLET | ORAL | 0 refills | Status: DC

## 2018-05-30 NOTE — Telephone Encounter (Signed)
Med isn't due it was refilled for a year on 08/13/17 so not due until 08/12/18, called pharmacy and pt has 2 months worth of refills on file but she was requesting a 90 day Rx so they didn't have enough refills to do that, Rx resent for 90 day supply and pt notified

## 2018-05-30 NOTE — Telephone Encounter (Signed)
Pt stated the pharmacy called her and stated the doc don't want to refill her pravastatin and she want to know why. Please advise pt.

## 2018-06-03 ENCOUNTER — Encounter: Payer: Self-pay | Admitting: Family Medicine

## 2018-06-13 DIAGNOSIS — H10503 Unspecified blepharoconjunctivitis, bilateral: Secondary | ICD-10-CM | POA: Diagnosis not present

## 2018-06-15 DIAGNOSIS — R0902 Hypoxemia: Secondary | ICD-10-CM | POA: Diagnosis not present

## 2018-06-30 ENCOUNTER — Telehealth: Payer: Self-pay | Admitting: Internal Medicine

## 2018-06-30 NOTE — Telephone Encounter (Signed)
Spoke with pt, aware of recs.  States she will get this rx otc.  Nothing further needed at this time.

## 2018-06-30 NOTE — Telephone Encounter (Signed)
Called and spoke to patient, patient states she received something in the mail stating Ranitidine has been re-called and she would like to know what she can take in place of this.   MW please advise. Thanks.

## 2018-06-30 NOTE — Telephone Encounter (Signed)
pepcid 20 mg is the equivalent to zantac 150 so she can do the math and take it otc or we can call her in rx

## 2018-07-01 ENCOUNTER — Encounter (HOSPITAL_COMMUNITY): Payer: Self-pay | Admitting: Cardiology

## 2018-07-01 ENCOUNTER — Ambulatory Visit (HOSPITAL_COMMUNITY)
Admission: RE | Admit: 2018-07-01 | Discharge: 2018-07-01 | Disposition: A | Discharge status: Home / Self Care | Payer: Medicare Other | Source: Ambulatory Visit | Attending: Cardiology | Admitting: Cardiology

## 2018-07-01 ENCOUNTER — Telehealth (HOSPITAL_COMMUNITY): Payer: Self-pay

## 2018-07-01 VITALS — BP 140/68 | HR 75 | Wt 243.4 lb

## 2018-07-01 DIAGNOSIS — K219 Gastro-esophageal reflux disease without esophagitis: Secondary | ICD-10-CM | POA: Diagnosis not present

## 2018-07-01 DIAGNOSIS — Z794 Long term (current) use of insulin: Secondary | ICD-10-CM | POA: Insufficient documentation

## 2018-07-01 DIAGNOSIS — N179 Acute kidney failure, unspecified: Secondary | ICD-10-CM | POA: Diagnosis not present

## 2018-07-01 DIAGNOSIS — Z955 Presence of coronary angioplasty implant and graft: Secondary | ICD-10-CM | POA: Diagnosis not present

## 2018-07-01 DIAGNOSIS — E785 Hyperlipidemia, unspecified: Secondary | ICD-10-CM | POA: Insufficient documentation

## 2018-07-01 DIAGNOSIS — Z7982 Long term (current) use of aspirin: Secondary | ICD-10-CM | POA: Diagnosis not present

## 2018-07-01 DIAGNOSIS — I2510 Atherosclerotic heart disease of native coronary artery without angina pectoris: Secondary | ICD-10-CM | POA: Diagnosis not present

## 2018-07-01 DIAGNOSIS — Z833 Family history of diabetes mellitus: Secondary | ICD-10-CM | POA: Diagnosis not present

## 2018-07-01 DIAGNOSIS — Z7902 Long term (current) use of antithrombotics/antiplatelets: Secondary | ICD-10-CM | POA: Diagnosis not present

## 2018-07-01 DIAGNOSIS — Z79899 Other long term (current) drug therapy: Secondary | ICD-10-CM | POA: Diagnosis not present

## 2018-07-01 DIAGNOSIS — Z9981 Dependence on supplemental oxygen: Secondary | ICD-10-CM | POA: Insufficient documentation

## 2018-07-01 DIAGNOSIS — Z87891 Personal history of nicotine dependence: Secondary | ICD-10-CM | POA: Diagnosis not present

## 2018-07-01 DIAGNOSIS — I5022 Chronic systolic (congestive) heart failure: Secondary | ICD-10-CM | POA: Diagnosis not present

## 2018-07-01 DIAGNOSIS — I110 Hypertensive heart disease with heart failure: Secondary | ICD-10-CM | POA: Diagnosis not present

## 2018-07-01 DIAGNOSIS — I255 Ischemic cardiomyopathy: Secondary | ICD-10-CM | POA: Diagnosis not present

## 2018-07-01 DIAGNOSIS — Z86718 Personal history of other venous thrombosis and embolism: Secondary | ICD-10-CM | POA: Insufficient documentation

## 2018-07-01 DIAGNOSIS — Z7952 Long term (current) use of systemic steroids: Secondary | ICD-10-CM | POA: Insufficient documentation

## 2018-07-01 DIAGNOSIS — J8489 Other specified interstitial pulmonary diseases: Secondary | ICD-10-CM | POA: Diagnosis not present

## 2018-07-01 DIAGNOSIS — Z8249 Family history of ischemic heart disease and other diseases of the circulatory system: Secondary | ICD-10-CM | POA: Diagnosis not present

## 2018-07-01 DIAGNOSIS — E875 Hyperkalemia: Secondary | ICD-10-CM | POA: Insufficient documentation

## 2018-07-01 LAB — BASIC METABOLIC PANEL
Anion gap: 10 (ref 5–15)
BUN: 34 mg/dL — ABNORMAL HIGH (ref 8–23)
CALCIUM: 9.4 mg/dL (ref 8.9–10.3)
CHLORIDE: 102 mmol/L (ref 98–111)
CO2: 29 mmol/L (ref 22–32)
CREATININE: 1.65 mg/dL — AB (ref 0.44–1.00)
GFR, EST AFRICAN AMERICAN: 34 mL/min — AB (ref >60)
GFR, EST NON AFRICAN AMERICAN: 29 mL/min — AB (ref >60)
Glucose, Bld: 110 mg/dL — ABNORMAL HIGH (ref 70–99)
Potassium: 4.7 mmol/L (ref 3.5–5.1)
SODIUM: 141 mmol/L (ref 135–145)

## 2018-07-01 LAB — LIPID PANEL
CHOLESTEROL: 182 mg/dL (ref 0–200)
HDL: 49 mg/dL (ref >40)
LDL Cholesterol: 87 mg/dL (ref 0–99)
TRIGLYCERIDES: 231 mg/dL — AB (ref <150)
Total CHOL/HDL Ratio: 3.7 RATIO
VLDL: 46 mg/dL — AB (ref 0–40)

## 2018-07-01 MED ORDER — SACUBITRIL-VALSARTAN 97-103 MG PO TABS: 1.0000 | ORAL_TABLET | Freq: Two times a day (BID) | ORAL | 6 refills | Status: DC

## 2018-07-01 NOTE — Patient Instructions (Signed)
STOP Pravastatin  INCREASE Entresto to 97/103mg  (1 tab) twice a day  Labs today We will only contact you if something comes back abnormal or we need to make some changes. Otherwise no news is good news!  Repeat labs in 2 weeks  You have been referred to the [facility].  They will contact your to schedule your appointment.   Your physician recommends that you schedule a follow-up appointment in: 3 months with an ECHO  Your physician has requested that you have an echocardiogram. Echocardiography is a painless test that uses sound waves to create images of your heart. It provides your doctor with information about the size and shape of your heart and how well your heart's chambers and valves are working. This procedure takes approximately one hour. There are no restrictions for this procedure.  Do the following things EVERYDAY: 1) Weigh yourself in the morning before breakfast. Write it down and keep it in a log. 2) Take your medicines as prescribed 3) Eat low salt foods-Limit salt (sodium) to 2000 mg per day.  4) Stay as active as you can everyday 5) Limit all fluids for the day to less than 2 liters

## 2018-07-01 NOTE — Telephone Encounter (Signed)
Pt called with lab results Decrease Lasix to every other day.  Repeat BMET 1 week. Lab scheduled

## 2018-07-03 NOTE — Progress Notes (Signed)
HF Cardiology: Dr Aundra Dubin  Pulmonologist: Dr Melvyn Novas.   HPI: Kelli Velasquez is a 77 y.o. 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   She had a 32 day-long admission (10/31-12/1/18) for acute hypoxic respiratory failure secondary to acute decompensated HF which was a new diagnosis at the time. She required intubation x2 and hospital course was complicated by MSSA pneumonia and RUE DVT.  LHC 06/16/17 with 95% stenosis of mid RCA and s/p DES. She was discharged to SNF on Entresto 49/51, Lasix 40 QD, spironolactone 25 QD, Plavix and Eliquis 5 BID. Weight 219 pounds at time of discharge.    Admitted 07/07/17 from SNF with presyncope and hypotension. HF meds held. Received IV fluids and antibiotics.   She is slowly tapering down on prednisone (for BOOP).  She continues on home oxygen.  Main complaint today is muscle pain, likely related to statin.  She has had muscle pain with multiple statins, now with pravastatin. Stable breathing, no dyspnea walking on flat ground if she is wearing her oxygen.  Dyspnea with inclines.  No chest pain.  No orthopnea/PND.  Weight is up 7 lbs.  SBP 120s-140s.    Labs (1/19): K 5.1, creatinine 1.1 Labs (2/19): K 4.5, creatinine 1.13, LDL 79, HDL 55, hgb 12.3 Labs (8/19): K 4.9, creatinine 1.26  PMH: 1. BOOP: Chronic prednisone, home oxygen.  2. Recurrent UTIs 3. HTN 4. LUE DVT 11/18 associated with PICC.  5. CAD:  - 2011 DES to LAD and RCA.  - LHC (11/18) with 95% mid RCA stenosis => DES.  6. Chronic systolic CHF: Ischemic cardiomyopathy.   - Echo (10/18) with EF 35-40%.  - Echo (2/19) with EF 40-45%, mild LVH, normal RV.  7. GERD 8. Hyperlipidemia  ROS: All systems negative except as listed in HPI, PMH and Problem List.  Social History   Socioeconomic History  . Marital status: Married    Spouse name: Not  on file  . Number of children: Not on file  . Years of education: Not on file  . Highest education level: Not on file  Occupational History  . Not on file  Social Needs  . Financial resource strain: Not on file  . Food insecurity:    Worry: Not on file    Inability: Not on file  . Transportation needs:    Medical: Not on file    Non-medical: Not on file  Tobacco Use  . Smoking status: Former Smoker    Packs/day: 1.00    Years: 25.00    Pack years: 25.00    Types: Cigarettes    Last attempt to quit: 07/27/1982    Years since quitting: 35.9  . Smokeless tobacco: Never Used  Substance and Sexual Activity  . Alcohol use: Yes    Alcohol/week: 0.0 standard drinks    Comment: wine-rare  . Drug use: No  . Sexual activity: Not Currently  Lifestyle  . Physical activity:    Days per week: Not on file    Minutes per session: Not on file  . Stress: Not on file  Relationships  . Social connections:    Talks on phone: Not on file    Gets together: Not on file    Attends religious service: Not on file    Active member of club or organization: Not on  file    Attends meetings of clubs or organizations: Not on file    Relationship status: Not on file  . Intimate partner violence:    Fear of current or ex partner: Not on file    Emotionally abused: Not on file    Physically abused: Not on file    Forced sexual activity: Not on file  Other Topics Concern  . Not on file  Social History Narrative  . Not on file   Family History  Problem Relation Age of Onset  . Stroke Mother   . Diabetes Mother 69  . Stroke Father   . Heart failure Father   . Breast cancer Maternal Grandmother   . Colon cancer Neg Hx     Current Outpatient Medications  Medication Sig Dispense Refill  . acetaminophen (TYLENOL) 325 MG tablet Take 650 mg by mouth every 6 (six) hours as needed for mild pain. Take as needed per bottle     . albuterol (PROAIR HFA) 108 (90 Base) MCG/ACT inhaler INHALE 2 PUFFS BY  MOUTH EVERY 4 HOURS AS NEEDED FOR WHEEZING OR FOR SHORTNESS OF BREATH 8 Inhaler 5  . aspirin 81 MG tablet Take 81 mg by mouth daily.    . bisoprolol (ZEBETA) 5 MG tablet Take 1.5 tablets (7.5 mg total) by mouth daily. 45 tablet 6  . chlorpheniramine (CHLOR-TRIMETON) 4 MG tablet Take 4 mg by mouth 2 (two) times daily as needed for allergies.    Marland Kitchen clopidogrel (PLAVIX) 75 MG tablet TAKE 1 TABLET BY MOUTH EVERY DAY 30 tablet 11  . cyanocobalamin (,VITAMIN B-12,) 1000 MCG/ML injection Inject 1,000 mcg into the muscle every 3 (three) months.    . esomeprazole (NEXIUM) 40 MG capsule Take 1 capsule (40 mg total) by mouth daily. 90 capsule 1  . ezetimibe (ZETIA) 10 MG tablet TAKE 1 TABLET (10 MG TOTAL) BY MOUTH DAILY. 90 tablet 1  . famotidine (PEPCID) 20 MG tablet Take 40 mg by mouth daily.    . furosemide (LASIX) 20 MG tablet Take 1 tablet (20 mg total) by mouth daily. 39 tablet 9  . insulin glargine (LANTUS) 100 UNIT/ML injection Inject 0.22 mLs (22 Units total) into the skin daily. 10 mL 5  . OXYGEN 24/7 2 lpm  [employer]    . predniSONE (DELTASONE) 5 MG tablet TAKE 1 TABLET BY MOUTH EVERY DAY WITH BREAKFAST (Patient taking differently: No sig reported) 90 tablet 1  . spironolactone (ALDACTONE) 25 MG tablet Take 0.5 tablets (12.5 mg total) by mouth daily. 15 tablet 5  . EPINEPHrine (EPIPEN 2-PAK) 0.3 mg/0.3 mL IJ SOAJ injection INJECT 0.3 MLS (0.3 MG TOTAL) INTO THE MUSCLE ONCE AS NEEDED FOR ALLERGIC REACTION (Patient not taking: Reported on 07/01/2018) 2 Device 1  . sacubitril-valsartan (ENTRESTO) 97-103 MG Take 1 tablet by mouth 2 (two) times daily. 60 tablet 6   No current facility-administered medications for this encounter.     Vitals:   07/01/18 1019  BP: 140/68  Pulse: 75  SpO2: 93%  Weight: 110.4 kg (243 lb 6.4 oz)    PHYSICAL EXAM: General: NAD Neck: Thick, no JVD, no thyromegaly or thyroid nodule.  Lungs: Crackles at bases bilaterally.  CV: Nondisplaced PMI.  Heart regular S1/S2, no  S3/S4, no murmur.  No peripheral edema.  No carotid bruit.  Normal pedal pulses.  Abdomen: Soft, nontender, no hepatosplenomegaly, no distention.  Skin: Intact without lesions or rashes.  Neurologic: Alert and oriented x 3.  Psych: Normal affect. Extremities: No clubbing  or cyanosis.  HEENT: Normal.    ASSESSMENT & PLAN: 1. Chronic systolic CHF: Ischemic cardiomyopathy.  Last echo in 2/19 with EF up to 40-45%.  NYHA class III symptoms, chronic.  She is not volume overloaded.  Much of residual dyspnea is likely due to chronic BOOP.  She was unable to tolerate increased spironolactone due to hyperkalemia.  - Continue spironolactone 12.5 daily.   - Increase Entresto to 97/103 bid.  BMET today and again in 2 wks.   - Continue bisoprolol 7.5 mg daily, she was very fatigued with attempt to increase bisoprolol to 7.5 mg daily.   - Continue Lasix 20 mg daily.  - She is currently out of ICD range.  - Repeat echo at followup in 3 months.  2. CAD: Most recently had DES to [facility] in 11/18.  No chest pain.   - Continue ASA 81 and Plavix 75.   - Continue Zetia.  She cannot tolerate pravastatin due to muscle pain, will stop. She has failed multiple statins now.  Will refer to lipid clinic for PCSK9 inhibitor.      3 H/O AKI: Check BMET today. 4. 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  5. RUE DVT: In 11/18, related to PICC.  Now off Eliquis.  Followup in 3 months with echo.   Loralie Champagne 07/03/2018

## 2018-07-04 ENCOUNTER — Ambulatory Visit: Payer: Self-pay

## 2018-07-05 ENCOUNTER — Telehealth: Payer: Self-pay | Admitting: Family Medicine

## 2018-07-05 DIAGNOSIS — E1169 Type 2 diabetes mellitus with other specified complication: Secondary | ICD-10-CM

## 2018-07-05 DIAGNOSIS — E7800 Pure hypercholesterolemia, unspecified: Secondary | ICD-10-CM

## 2018-07-05 DIAGNOSIS — E669 Obesity, unspecified: Secondary | ICD-10-CM

## 2018-07-05 DIAGNOSIS — I10 Essential (primary) hypertension: Secondary | ICD-10-CM

## 2018-07-05 DIAGNOSIS — E538 Deficiency of other specified B group vitamins: Secondary | ICD-10-CM

## 2018-07-05 NOTE — Telephone Encounter (Signed)
-----   Message from Eustace Pen, LPN sent at 65/03/9786  4:05 PM EST ----- Regarding: Labs 12/11 Lab orders needed. Thank you.  Insurance:  [facility] [employer]

## 2018-07-06 ENCOUNTER — Ambulatory Visit (INDEPENDENT_AMBULATORY_CARE_PROVIDER_SITE_OTHER): Payer: Medicare Other

## 2018-07-06 VITALS — BP 116/64 | HR 68 | Temp 98.7°F | Ht 64.5 in | Wt 243.0 lb

## 2018-07-06 DIAGNOSIS — E669 Obesity, unspecified: Secondary | ICD-10-CM

## 2018-07-06 DIAGNOSIS — E7800 Pure hypercholesterolemia, unspecified: Secondary | ICD-10-CM

## 2018-07-06 DIAGNOSIS — I10 Essential (primary) hypertension: Secondary | ICD-10-CM

## 2018-07-06 DIAGNOSIS — E538 Deficiency of other specified B group vitamins: Secondary | ICD-10-CM | POA: Diagnosis not present

## 2018-07-06 DIAGNOSIS — E1169 Type 2 diabetes mellitus with other specified complication: Secondary | ICD-10-CM | POA: Diagnosis not present

## 2018-07-06 DIAGNOSIS — Z0000 Encounter for general adult medical examination without abnormal findings: Secondary | ICD-10-CM | POA: Diagnosis not present

## 2018-07-06 LAB — CBC WITH DIFFERENTIAL/PLATELET
Basophils Absolute: 0.1 10*3/uL (ref 0.0–0.1)
Basophils Relative: 0.7 % (ref 0.0–3.0)
Eosinophils Absolute: 1.2 10*3/uL — ABNORMAL HIGH (ref 0.0–0.7)
Eosinophils Relative: 9.6 % — ABNORMAL HIGH (ref 0.0–5.0)
HCT: 35.7 % — ABNORMAL LOW (ref 36.0–46.0)
HEMOGLOBIN: 11.6 g/dL — AB (ref 12.0–15.0)
LYMPHS ABS: 2.2 10*3/uL (ref 0.7–4.0)
Lymphocytes Relative: 16.7 % (ref 12.0–46.0)
MCHC: 32.6 g/dL (ref 30.0–36.0)
MCV: 97.2 fl (ref 78.0–100.0)
MONOS PCT: 6.3 % (ref 3.0–12.0)
Monocytes Absolute: 0.8 10*3/uL (ref 0.1–1.0)
Neutro Abs: 8.7 10*3/uL — ABNORMAL HIGH (ref 1.4–7.7)
Neutrophils Relative %: 66.7 % (ref 43.0–77.0)
Platelets: 261 10*3/uL (ref 150.0–400.0)
RBC: 3.67 Mil/uL — ABNORMAL LOW (ref 3.87–5.11)
RDW: 15.7 % — ABNORMAL HIGH (ref 11.5–15.5)
WBC: 13 10*3/uL — ABNORMAL HIGH (ref 4.0–10.5)

## 2018-07-06 LAB — LIPID PANEL
Cholesterol: 188 mg/dL (ref 0–200)
HDL: 40.6 mg/dL (ref >39.00)
NonHDL: 147.71
Total CHOL/HDL Ratio: 5
Triglycerides: 305 mg/dL — ABNORMAL HIGH (ref 0.0–149.0)
VLDL: 61 mg/dL — ABNORMAL HIGH (ref 0.0–40.0)

## 2018-07-06 LAB — COMPREHENSIVE METABOLIC PANEL
ALT: 24 U/L (ref 0–35)
AST: 18 U/L (ref 0–37)
Albumin: 3.8 g/dL (ref 3.5–5.2)
Alkaline Phosphatase: 51 U/L (ref 39–117)
BUN: 22 mg/dL (ref 6–23)
CO2: 33 mEq/L — ABNORMAL HIGH (ref 19–32)
Calcium: 9.3 mg/dL (ref 8.4–10.5)
Chloride: 102 mEq/L (ref 96–112)
Creatinine, Ser: 1.17 mg/dL (ref 0.40–1.20)
GFR: 47.5 mL/min — ABNORMAL LOW (ref >60.00)
Glucose, Bld: 93 mg/dL (ref 70–99)
Potassium: 4.7 mEq/L (ref 3.5–5.1)
Sodium: 138 mEq/L (ref 135–145)
Total Bilirubin: 0.3 mg/dL (ref 0.2–1.2)
Total Protein: 6.7 g/dL (ref 6.0–8.3)

## 2018-07-06 LAB — LDL CHOLESTEROL, DIRECT: Direct LDL: 121 mg/dL

## 2018-07-06 LAB — TSH: TSH: 2.25 u[IU]/mL (ref 0.35–4.50)

## 2018-07-06 LAB — HEMOGLOBIN A1C: Hgb A1c MFr Bld: 5.4 % (ref 4.6–6.5)

## 2018-07-06 LAB — VITAMIN B12: Vitamin B-12: 514 pg/mL (ref 211–911)

## 2018-07-06 NOTE — Progress Notes (Signed)
PCP notes:   Health maintenance:  A1C - completed Mammogram - PCP follow-up requested  Abnormal screenings:   Hearing - failed  Hearing Screening   125Hz  250Hz  500Hz  1000Hz  2000Hz  3000Hz  4000Hz  6000Hz  8000Hz   Right ear:   40 40 40  40    Left ear:   40 0 40  40     Patient concerns:   None  Nurse concerns:  None  Next PCP appt:   07/08/2018 @ 0900  I reviewed health advisor's note, was available for consultation, and agree with documentation and plan. Loura Pardon MD

## 2018-07-06 NOTE — Patient Instructions (Signed)
Ms. Hauck , Thank you for taking time to come for your Medicare Wellness Visit. I appreciate your ongoing commitment to your health goals. Please review the following plan we discussed and let me know if I can assist you in the future.   These are the goals we discussed: Goals    . lose weight     Starting 07/06/2018, I will continue to practice portion control in an effort to lose weight.        This is a list of the screening recommended for you and due dates:  Health Maintenance  Topic Date Due  . Mammogram  07/27/2019*  . Tetanus Vaccine  07/21/2025*  . Eye exam for diabetics  09/29/2018  . Complete foot exam   01/04/2019  . Hemoglobin A1C  01/05/2019  . Flu Shot  Completed  . DEXA scan (bone density measurement)  Completed  . Pneumonia vaccines  Completed  *Topic was postponed. The date shown is not the original due date.   Preventive Care for Adults  A healthy lifestyle and preventive care can promote health and wellness. Preventive health guidelines for adults include the following key practices.  . A routine yearly physical is a good way to check with your health care provider about your health and preventive screening. It is a chance to share any concerns and updates on your health and to receive a thorough exam.  . Visit your dentist for a routine exam and preventive care every 6 months. Brush your teeth twice a day and floss once a day. Good oral hygiene prevents tooth decay and gum disease.  . The frequency of eye exams is based on your age, health, family medical history, use  of contact lenses, and other factors. Follow your health care provider's recommendations for frequency of eye exams.  . Eat a healthy diet. Foods like vegetables, fruits, whole grains, low-fat dairy products, and lean protein foods contain the nutrients you need without too many calories. Decrease your intake of foods high in solid fats, added sugars, and salt. Eat the right amount of calories  for you. Get information about a proper diet from your health care provider, if necessary.  . Regular physical exercise is one of the most important things you can do for your health. Most adults should get at least 150 minutes of moderate-intensity exercise (any activity that increases your heart rate and causes you to sweat) each week. In addition, most adults need muscle-strengthening exercises on 2 or more days a week.  Silver Sneakers may be a benefit available to you. To determine eligibility, you may visit the website: www.silversneakers.com or contact program at 779-174-5952 Mon-Fri between 8AM-8PM.   . Maintain a healthy weight. The body mass index (BMI) is a screening tool to identify possible weight problems. It provides an estimate of body fat based on height and weight. Your health care provider can find your BMI and can help you achieve or maintain a healthy weight.   For adults 20 years and older: ? A BMI below 18.5 is considered underweight. ? A BMI of 18.5 to 24.9 is normal. ? A BMI of 25 to 29.9 is considered overweight. ? A BMI of 30 and above is considered obese.   . Maintain normal blood lipids and cholesterol levels by exercising and minimizing your intake of saturated fat. Eat a balanced diet with plenty of fruit and vegetables. Blood tests for lipids and cholesterol should begin at age 81 and be repeated every 5 years.  If your lipid or cholesterol levels are high, you are over 50, or you are at high risk for heart disease, you may need your cholesterol levels checked more frequently. Ongoing high lipid and cholesterol levels should be treated with medicines if diet and exercise are not working.  . If you smoke, find out from your health care provider how to quit. If you do not use tobacco, please do not start.  . If you choose to drink alcohol, please do not consume more than 2 drinks per day. One drink is considered to be 12 ounces (355 mL) of beer, 5 ounces (148 mL) of  wine, or 1.5 ounces (44 mL) of liquor.  . If you are 12-62 years old, ask your health care provider if you should take aspirin to prevent strokes.  . Use sunscreen. Apply sunscreen liberally and repeatedly throughout the day. You should seek shade when your shadow is shorter than you. Protect yourself by wearing long sleeves, pants, a wide-brimmed hat, and sunglasses year round, whenever you are outdoors.  . Once a month, do a whole body skin exam, using a mirror to look at the skin on your back. Tell your health care provider of new moles, moles that have irregular borders, moles that are larger than a pencil eraser, or moles that have changed in shape or color.

## 2018-07-06 NOTE — Progress Notes (Signed)
Subjective:   Kelli Velasquez is a 77 y.o. female who presents for Medicare Annual (Subsequent) preventive examination.  Review of Systems:  N/A Cardiac Risk Factors include: advanced age (>61men, >52 women);dyslipidemia;hypertension;obesity (BMI >30kg/m2)     Objective:     Vitals: BP 116/64 (BP Location: Right Arm, Patient Position: Sitting, Cuff Size: Large)   Pulse 68   Temp 98.7 F (37.1 C) (Oral)   Ht 5' 4.5" (1.638 m) Comment: no shoes  Wt 243 lb (110.2 kg)   LMP 07/27/1980   SpO2 95% Comment: oxygen 2L [place]  BMI 41.07 kg/m   Body mass index is 41.07 kg/m.  Advanced Directives 07/06/2018 07/08/2017 07/07/2017 05/26/2017 05/26/2017 02/03/2017 12/16/2016  Does Patient Have a Medical Advance Directive? Yes Yes Yes Yes No Yes Yes  Type of Paramedic of [place];Living will Out of facility DNR (pink MOST or yellow form) Out of facility DNR (pink MOST or yellow form) Living will - Healthcare Power of [place];Living will [employer];Living will  Does patient want to make changes to medical advance directive? - No - Patient declined - No - Patient declined - No - Patient declined No - Patient declined  Copy of [place] in Chart? No - copy requested - - - - No - copy requested Yes  Would patient like information on creating a medical advance directive? - - - No - Patient declined - - -  Pre-existing out of facility DNR order (yellow form or pink MOST form) - - - - - - -    Tobacco Social History   Tobacco Use  Smoking Status Former Smoker  . Packs/day: 1.00  . Years: 25.00  . Pack years: 25.00  . Types: Cigarettes  . Last attempt to quit: 07/27/1982  . Years since quitting: 35.9  Smokeless Tobacco Never Used     Counseling given: No   Clinical Intake:  Pre-visit preparation completed: Yes  Pain Score: 7      Nutritional Status: BMI > 30  Obese Nutritional Risks: None Diabetes: No  How often do you need  to have someone help you when you read instructions, pamphlets, or other written materials from your doctor or pharmacy?: 1 - Never What is the last grade level you completed in school?: 12th grade  Interpreter Needed?: No  Comments: pt lives with spouse Information entered by :: LPinson, LPN  Past Medical History:  Diagnosis Date  . Allergy    allergic rhinitis  . Asthma   . Chronic bronchitis ([place])   . Coronary artery disease    cath January 2011 with DEs LAD and RCA  . Dyspnea   . Full dentures   . Gallstones   . GERD (gastroesophageal reflux disease)   . History of echocardiogram    Echo 5/17: mod LVH, EF 50-55%, ant-septal HK, Gr 1 DD, mod LAE //  b.  Echo 7/17: mild concentric LVH, EF 45-50%, inf-lat, inf, inf-septal HK, mild LAE  . History of nuclear stress test    Myoview 7/17: EF 48%, small mild apical defect, no ischemia, low risk  . Hyperlipidemia   . Hypertension   . 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 Myopia   . 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 Nocturnal oxygen desaturation    o2 at night  . Obesity   . Osteoarthritis    knees, fingers, shoulders  .  Overactive bladder   . Oxygen deficiency    uses oxygen all day  . Rash    and other non specific skin eruptions  . Retaining fluid    in ankles and feet  . Urinary incontinence   . Vertigo   . Wears glasses    Past Surgical History:  Procedure Laterality Date  . CATARACT EXTRACTION W/PHACO Right 12/16/2016   Procedure: CATARACT EXTRACTION PHACO AND INTRAOCULAR LENS PLACEMENT ([place])  right;  Surgeon: Leandrew Koyanagi, MD;  Location: [facility];  Service: Ophthalmology;  Laterality: Right;  . CATARACT EXTRACTION W/PHACO Left 02/03/2017   Procedure: CATARACT EXTRACTION PHACO AND INTRAOCULAR LENS PLACEMENT ([place])  Left  Complicated;  Surgeon: Leandrew Koyanagi, MD;  Location: [facility];  Service:  Ophthalmology;  Laterality: Left;  [place] Uses oxygen   . CHOLECYSTECTOMY  92  . COLONOSCOPY    . CORONARY ANGIOGRAPHY N/A 06/16/2017   Procedure: CORONARY ANGIOGRAPHY;  Surgeon: Larey Dresser, MD;  Location: [facility];  Service: Cardiovascular;  Laterality: N/A;  . CORONARY ANGIOPLASTY WITH STENT PLACEMENT  07/2009   stent  . CORONARY STENT INTERVENTION N/A 06/16/2017   Procedure: CORONARY STENT INTERVENTION;  Surgeon: Wellington Hampshire, MD;  Location: [facility];  Service: Cardiovascular;  Laterality: N/A;  . DILATION AND CURETTAGE OF UTERUS  1984  . INCISIONAL HERNIA REPAIR N/A 05/16/2013   Procedure: HERNIA REPAIR INFRAUMILICAL INCISIONAL;  Surgeon: Joyice Faster. Cornett, MD;  Location: [facility];  Service: General;  Laterality: N/A;  umbilical  . INSERTION OF MESH N/A 05/16/2013   Procedure: INSERTION OF MESH;  Surgeon: Joyice Faster. Cornett, MD;  Location: [facility];  Service: General;  Laterality: N/A;  umbilical  . JOINT REPLACEMENT  2007   right total knee replacement  . RIGHT/LEFT HEART CATH AND CORONARY ANGIOGRAPHY N/A 06/16/2017   Procedure: RIGHT/LEFT HEART CATH AND CORONARY ANGIOGRAPHY;  Surgeon: Larey Dresser, MD;  Location: [facility];  Service: Cardiovascular;  Laterality: N/A;  . TOTAL KNEE ARTHROPLASTY  2010   left , then vocal cord infection post op  . VIDEO BRONCHOSCOPY Bilateral 07/05/2013   Procedure: VIDEO BRONCHOSCOPY WITH FLUORO;  Surgeon: Tanda Rockers, MD;  Location: [facility];  Service: Cardiopulmonary;  Laterality: Bilateral;  . vocal cord polypectomy     Family History  Problem Relation Age of Onset  . Stroke Mother   . Diabetes Mother 52  . Stroke Father   . Heart failure Father   . Breast cancer Maternal Grandmother   . Colon cancer Neg Hx    Social History   Socioeconomic History  . Marital status: Married    Spouse name: Not on file  . Number of children: Not on file  . Years of  education: Not on file  . Highest education level: Not on file  Occupational History  . Not on file  Social Needs  . Financial resource strain: Not on file  . Food insecurity:    Worry: Not on file    Inability: Not on file  . Transportation needs:    Medical: Not on file    Non-medical: Not on file  Tobacco Use  . Smoking status: Former Smoker    Packs/day: 1.00    Years: 25.00    Pack years: 25.00    Types: Cigarettes    Last attempt to quit: 07/27/1982    Years since quitting: 35.9  . Smokeless tobacco: Never Used  Substance and  Sexual Activity  . Alcohol use: Yes    Alcohol/week: 0.0 standard drinks    Comment: wine-rare  . Drug use: No  . Sexual activity: Not Currently  Lifestyle  . Physical activity:    Days per week: Not on file    Minutes per session: Not on file  . Stress: Not on file  Relationships  . Social connections:    Talks on phone: Not on file    Gets together: Not on file    Attends religious service: Not on file    Active member of club or organization: Not on file    Attends meetings of clubs or organizations: Not on file    Relationship status: Not on file  Other Topics Concern  . Not on file  Social History Narrative  . Not on file    Outpatient Encounter Medications as of 07/06/2018  Medication Sig  . acetaminophen (TYLENOL) 325 MG tablet Take 650 mg by mouth every 6 (six) hours as needed for mild pain. Take as needed per bottle   . albuterol (PROAIR HFA) 108 (90 Base) MCG/ACT inhaler INHALE 2 PUFFS BY MOUTH EVERY 4 HOURS AS NEEDED FOR WHEEZING OR FOR SHORTNESS OF BREATH  . aspirin 81 MG tablet Take 81 mg by mouth daily.  . bisoprolol (ZEBETA) 5 MG tablet Take 1.5 tablets (7.5 mg total) by mouth daily.  . chlorpheniramine (CHLOR-TRIMETON) 4 MG tablet Take 4 mg by mouth 2 (two) times daily as needed for allergies.  Marland Kitchen clopidogrel (PLAVIX) 75 MG tablet TAKE 1 TABLET BY MOUTH EVERY DAY  . cyanocobalamin (,VITAMIN B-12,) 1000 MCG/ML injection  Inject 1,000 mcg into the muscle every 3 (three) months.  . EPINEPHrine (EPIPEN 2-PAK) 0.3 mg/0.3 mL IJ SOAJ injection INJECT 0.3 MLS (0.3 MG TOTAL) INTO THE MUSCLE ONCE AS NEEDED FOR ALLERGIC REACTION  . esomeprazole (NEXIUM) 40 MG capsule Take 1 capsule (40 mg total) by mouth daily.  Marland Kitchen ezetimibe (ZETIA) 10 MG tablet TAKE 1 TABLET (10 MG TOTAL) BY MOUTH DAILY.  . famotidine (PEPCID) 20 MG tablet Take 40 mg by mouth daily.  . furosemide (LASIX) 20 MG tablet Take 1 tablet (20 mg total) by mouth daily.  . insulin glargine (LANTUS) 100 UNIT/ML injection Inject 0.22 mLs (22 Units total) into the skin daily.  . OXYGEN 24/7 2 lpm  [employer]  . predniSONE (DELTASONE) 5 MG tablet TAKE 1 TABLET BY MOUTH EVERY DAY WITH BREAKFAST (Patient taking differently: No sig reported)  . sacubitril-valsartan (ENTRESTO) 97-103 MG Take 1 tablet by mouth 2 (two) times daily.  Marland Kitchen spironolactone (ALDACTONE) 25 MG tablet Take 0.5 tablets (12.5 mg total) by mouth daily.   No facility-administered encounter medications on file as of 07/06/2018.     Activities of Daily Living In your present state of health, do you have any difficulty performing the following activities: 07/06/2018 07/08/2017  Hearing? N N  Vision? N N  Difficulty concentrating or making decisions? N N  Walking or climbing stairs? Y Y  Dressing or bathing? N -  Doing errands, shopping? N -  Preparing Food and eating ? N -  Using the Toilet? N -  In the past six months, have you accidently leaked urine? Y -  Do you have problems with loss of bowel control? N -  Managing your Medications? N -  Managing your Finances? N -  Housekeeping or managing your Housekeeping? N -  Some recent data might be hidden    Patient Care Team: [place], [place]  A, MD as PCP - General    Assessment:   This is a routine wellness examination for [facility].   Hearing Screening   125Hz  250Hz  500Hz  1000Hz  2000Hz  3000Hz  4000Hz  6000Hz  8000Hz   Right ear:   40 40 40  40    Left ear:    40 0 40  40    Vision Screening Comments: Vision exam in March 2019 with Dr. Wallace Going   Exercise Activities and Dietary recommendations Current Exercise Habits: The patient does not participate in regular exercise at present, Exercise limited by: respiratory conditions(s)  Goals    . lose weight     Starting 07/06/2018, I will continue to practice portion control in an effort to lose weight.        Fall Risk Fall Risk  07/06/2018 12/02/2016 07/13/2016  Falls in the past year? 0 No No    Depression Screen PHQ 2/9 Scores 07/06/2018 01/03/2018 12/02/2016 07/13/2016  PHQ - 2 Score 0 2 2 0  PHQ- 9 Score 3 6 - -     Cognitive Function MMSE - Mini Mental State Exam 07/06/2018 12/02/2016  Orientation to time 5 5  Orientation to Place 5 5  Registration 3 3  Attention/ Calculation 0 0  Recall 3 2  Recall-comments - pt was unable to recall 1 of 3 words  Language- name 2 objects 0 0  Language- repeat 1 1  Language- follow 3 step command 3 2  Language- follow 3 step command-comments - pt was unable to follow 1 step of 3 step command  Language- read & follow direction 0 0  Write a sentence 0 0  Copy design 0 0  Total score 20 18       PLEASE NOTE: A Mini-Cog screen was completed. Maximum score is 20. A value of 0 denotes this part of [place] MMSE was not completed or the patient failed this part of the Mini-Cog screening.   Mini-Cog Screening Orientation to Time - Max 5 pts Orientation to Place - Max 5 pts Registration - Max 3 pts Recall - Max 3 pts Language Repeat - Max 1 pts Language Follow 3 Step Command - Max 3 pts   Immunization History  Administered Date(s) Administered  . Influenza Split 06/16/2011  . Influenza Whole 05/12/2005, 05/31/2007, 05/07/2008, 05/15/2010  . Influenza,inj,Quad PF,6+ Mos 04/03/2013, 04/18/2014, 03/29/2015, 04/30/2016, 04/23/2017, 05/19/2018  . Influenza-Unspecified 05/23/2012  . Pneumococcal Conjugate-13 10/09/2014  . Pneumococcal  Polysaccharide-23 11/27/2009  . Td 07/22/2005    Screening Tests Health Maintenance  Topic Date Due  . MAMMOGRAM  07/27/2019 (Originally 03/31/2018)  . TETANUS/TDAP  07/21/2025 (Originally 07/23/2015)  . OPHTHALMOLOGY EXAM  09/29/2018  . FOOT EXAM  01/04/2019  . HEMOGLOBIN A1C  01/05/2019  . INFLUENZA VACCINE  Completed  . DEXA SCAN  Completed  . PNA vac Low Risk Adult  Completed      Plan:     I have personally reviewed, addressed, and noted the following in the patient's chart:  A. Medical and social history B. Use of alcohol, tobacco or illicit drugs  C. Current medications and supplements D. Functional ability and status E.  Nutritional status F.  Physical activity G. Advance directives H. List of other physicians I.  Hospitalizations, surgeries, and ER visits in previous 12 months J.  [place] to include hearing, vision, cognitive, depression L. Referrals and appointments - none  In addition, I have reviewed and discussed with patient certain preventive protocols, quality metrics, and best practice recommendations. A  written personalized care plan for preventive services as well as general preventive health recommendations were provided to patient.  See attached scanned questionnaire for additional information.   Signed,   Lindell Noe, MHA, BS, LPN Health Coach

## 2018-07-07 ENCOUNTER — Ambulatory Visit (HOSPITAL_COMMUNITY)
Admission: RE | Admit: 2018-07-07 | Discharge: 2018-07-07 | Disposition: A | Discharge status: Home / Self Care | Payer: Medicare Other | Source: Ambulatory Visit | Attending: Cardiology | Admitting: Cardiology

## 2018-07-07 DIAGNOSIS — I5022 Chronic systolic (congestive) heart failure: Secondary | ICD-10-CM | POA: Insufficient documentation

## 2018-07-07 LAB — BASIC METABOLIC PANEL
Anion gap: 10 (ref 5–15)
BUN: 24 mg/dL — ABNORMAL HIGH (ref 8–23)
CHLORIDE: 102 mmol/L (ref 98–111)
CO2: 28 mmol/L (ref 22–32)
Calcium: 9.3 mg/dL (ref 8.9–10.3)
Creatinine, Ser: 1.19 mg/dL — ABNORMAL HIGH (ref 0.44–1.00)
GFR calc Af Amer: 51 mL/min — ABNORMAL LOW (ref >60)
GFR calc non Af Amer: 44 mL/min — ABNORMAL LOW (ref >60)
Glucose, Bld: 109 mg/dL — ABNORMAL HIGH (ref 70–99)
Potassium: 4.6 mmol/L (ref 3.5–5.1)
Sodium: 140 mmol/L (ref 135–145)

## 2018-07-08 ENCOUNTER — Ambulatory Visit (INDEPENDENT_AMBULATORY_CARE_PROVIDER_SITE_OTHER): Payer: Medicare Other | Admitting: Family Medicine

## 2018-07-08 ENCOUNTER — Encounter: Payer: Self-pay | Admitting: Family Medicine

## 2018-07-08 VITALS — BP 130/64 | HR 75 | Temp 97.4°F | Ht 64.5 in | Wt 241.5 lb

## 2018-07-08 DIAGNOSIS — J8489 Other specified interstitial pulmonary diseases: Secondary | ICD-10-CM

## 2018-07-08 DIAGNOSIS — M8580 Other specified disorders of bone density and structure, unspecified site: Secondary | ICD-10-CM

## 2018-07-08 DIAGNOSIS — Z0000 Encounter for general adult medical examination without abnormal findings: Secondary | ICD-10-CM | POA: Diagnosis not present

## 2018-07-08 DIAGNOSIS — E669 Obesity, unspecified: Secondary | ICD-10-CM

## 2018-07-08 DIAGNOSIS — I10 Essential (primary) hypertension: Secondary | ICD-10-CM | POA: Diagnosis not present

## 2018-07-08 DIAGNOSIS — E538 Deficiency of other specified B group vitamins: Secondary | ICD-10-CM

## 2018-07-08 DIAGNOSIS — L309 Dermatitis, unspecified: Secondary | ICD-10-CM

## 2018-07-08 DIAGNOSIS — E1169 Type 2 diabetes mellitus with other specified complication: Secondary | ICD-10-CM

## 2018-07-08 MED ORDER — CLOPIDOGREL BISULFATE 75 MG PO TABS: 75.0000 mg | ORAL_TABLET | Freq: Every day | ORAL | 3 refills | Status: DC

## 2018-07-08 MED ORDER — INSULIN GLARGINE 100 UNIT/ML ~~LOC~~ SOLN: 22.0000 [IU] | Freq: Every day | SUBCUTANEOUS | 5 refills | Status: DC

## 2018-07-08 MED ORDER — TRIAMCINOLONE ACETONIDE 0.1 % EX CREA: 1.0000 "application " | TOPICAL_CREAM | Freq: Every day | CUTANEOUS | 1 refills | Status: DC

## 2018-07-08 MED ORDER — EZETIMIBE 10 MG PO TABS: ORAL_TABLET | ORAL | 3 refills | Status: DC

## 2018-07-08 MED ORDER — ESOMEPRAZOLE MAGNESIUM 40 MG PO CPDR: 40.0000 mg | DELAYED_RELEASE_CAPSULE | Freq: Every day | ORAL | 3 refills | Status: DC

## 2018-07-08 NOTE — Assessment & Plan Note (Signed)
Reviewed health habits including diet and exercise and skin cancer prevention Reviewed appropriate screening tests for age  Also reviewed health mt list, fam hx and immunization status , as well as social and family history   Labs rev  amw rev  Chronic medical problems are stable  Tries to be active  She will ask pulmonary about safety of Prolia for OP and also the shingrix vaccine  Also ask cardiology if safe to take D and Ca

## 2018-07-08 NOTE — Patient Instructions (Addendum)
If you are interested in the new shingles vaccine (Shingrix) - call your local pharmacy to check on coverage and availability  If affordable, get on a wait list at your pharmacy to get the vaccine. Ask your pulmonary doctor if you are a candidate for this   Also talk to your pulmonary doctor re: whether it would be safe to take Prolia with the prednisone   You need vitamin D (as well as calcium) for bone health Please ask your cardiologist if this is safe for you   Stay as active as you can safely be  Use walker when needed  Try the triamcinolone cream on ears  If it does not help in 2 weeks - stop it

## 2018-07-08 NOTE — Progress Notes (Signed)
Subjective:    Patient ID: Kelli Velasquez, female    DOB: Jul 01, 1941, 77 y.o.   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  Stable-chronic resp failure  Continues on 02 -continuous and prednisone She gets sob with exertion -esp stairs  Has done cardiopulm rehab in past  Prednisone- working to decrease when able  Recent ESR was improved  Lab Results  Component Value Date   ESRSEDRATE 25 05/17/2018      Heart - doing better with heart failure now  Had some issues with renal fxn and cardiologist continues to titrate medications   DM2 Lab Results  Component Value Date   HGBA1C 5.4 07/06/2018  eye exam is up to date  Stays on prednisone -and lantus    'B12 def Lab Results  Component Value Date   VITAMINB12 514 07/06/2018   Gets injections every 3 mo  Due at end of jan   Hyperlipidemia Lab Results  Component Value Date   CHOL 188 07/06/2018   CHOL 182 07/01/2018   CHOL 172 08/27/2017   Lab Results  Component Value Date   HDL 40.60 07/06/2018   HDL 49 07/01/2018   HDL 55 08/27/2017   Lab Results  Component Value Date   LDLCALC 87 07/01/2018   LDLCALC 79 08/27/2017   LDLCALC 90 02/01/2017   Lab Results  Component Value Date   TRIG 305.0 (H) 07/06/2018   TRIG 231 (H) 07/01/2018   TRIG 188 (H) 08/27/2017   Lab Results  Component Value Date   CHOLHDL 5 07/06/2018   CHOLHDL 3.7 07/01/2018   CHOLHDL 3.1 08/27/2017   Lab Results  Component Value Date   LDLDIRECT 121.0 07/06/2018   LDLDIRECT 124.8 09/01/2007   LDLDIRECT 132.2 04/11/2007  trig are up   zetia Intol of statins Will be starting PCYK9 inhibitor ---- Repatha   Lab Results  Component Value Date   CREATININE 1.19 (  H) 07/07/2018   BUN 24 (H) 07/07/2018   NA 140 07/07/2018   K 4.6 07/07/2018   CL 102 07/07/2018   CO2 28 07/07/2018  cr is improved  Decreased lasix  Lab Results  Component Value Date   ALT 24 07/06/2018   AST 18 07/06/2018   ALKPHOS 51 07/06/2018   BILITOT 0.3 07/06/2018   Lab Results  Component Value Date   WBC 13.0 (H) 07/06/2018   HGB 11.6 (L) 07/06/2018   HCT 35.7 (L) 07/06/2018   MCV 97.2 07/06/2018   PLT 261.0 07/06/2018   Lab Results  Component Value Date   TSH 2.25 07/06/2018    Patient Active Problem List   Diagnosis Date Noted  . Dermatitis 07/08/2018  . Hoarseness of voice 01/03/2018  . Coronary artery disease 07/07/2017  . Chronic systolic heart failure ([place]) 07/07/2017  . Abnormal urinalysis 07/07/2017  . History of DVT (deep vein thrombosis) 07/07/2017  . Diabetes mellitus type 2 in obese ([place]) 07/07/2017  . BOOP  (bronchiolitis obliterans with organizing pneumonia) ([place])   . Hypoxemia   . Unstable angina ([place])   . Acute on chronic systolic congestive heart failure ([place])   . Encounter for screening mammogram for breast cancer 02/02/2017  . Chronic combined systolic and diastolic CHF, NYHA class 2 ([place]) 02/02/2017  . Depressed mood 12/02/2016  . Routine general medical examination at a health care facility 12/30/2015  . Supraumbilical hernia 63/87/5643  . Lump in the abdomen 04/10/2015  . GERD (gastroesophageal reflux disease) 10/10/2014  . B12 deficiency 10/09/2014  . Cataracts, bilateral 10/09/2014  . Dry eyes 10/09/2014  . Fatigue 05/08/2014  . Pedal edema 05/08/2014  . Osteopenia 11/28/2013  . Estrogen deficiency 11/02/2013  . Dyspnea on exertion 06/27/2013  . Hernia, incisional 04/03/2013  . 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 Cough 12/18/2012  . Chronic respiratory failure with hypoxia and hypercapnia ([place]) 10/26/2012  . Special screening for malignant neoplasms, colon 06/16/2011  . CAD, NATIVE VESSEL 08/19/2009  . 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 Morbid obesity due to excess calories ([place]) comp by hbp/ hyperlipidemia/ dm/ihd 09/01/2007  . MYOPIA 03/03/2007  . Hyperlipidemia 10/15/2006  . Essential hypertension 10/15/2006  . Allergic rhinitis 10/15/2006  . Cough variant asthma vs UACS  10/15/2006  . OVERACTIVE BLADDER 10/15/2006  . OSTEOARTHRITIS 10/15/2006  . Urinary incontinence 10/15/2006   Past Medical History:  Diagnosis Date  . Allergy    allergic rhinitis  . Asthma   . Chronic bronchitis ([place])   . Coronary artery disease    cath January 2011 with DEs LAD and RCA  . Dyspnea   . Full dentures   . Gallstones   . GERD (gastroesophageal reflux disease)   . History of echocardiogram    Echo 5/17: mod LVH, EF 50-55%, ant-septal HK, Gr 1 DD, mod LAE //  b.  Echo 7/17: mild concentric LVH, EF 45-50%, inf-lat, inf, inf-septal  HK, mild LAE  . History of nuclear stress test    Myoview 7/17: EF 48%, small mild apical defect, no ischemia, low risk  . Hyperlipidemia   . Hypertension   . Myocardial infarction ([place])    subendocardial, initial episode, 2010 two stents placed  . Myopia   . 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 Nocturnal oxygen desaturation    o2 at night  . Obesity   . Osteoarthritis    knees, fingers, shoulders  . Overactive bladder   .  Oxygen deficiency    uses oxygen all day  . Rash    and other non specific skin eruptions  . Retaining fluid    in ankles and feet  . Urinary incontinence   . Vertigo   . Wears glasses    Past Surgical History:  Procedure Laterality Date  . CATARACT EXTRACTION W/PHACO Right 12/16/2016   Procedure: CATARACT EXTRACTION PHACO AND INTRAOCULAR LENS PLACEMENT ([place])  right;  Surgeon: Leandrew Koyanagi, MD;  Location: [facility];  Service: Ophthalmology;  Laterality: Right;  . CATARACT EXTRACTION W/PHACO Left 02/03/2017   Procedure: CATARACT EXTRACTION PHACO AND INTRAOCULAR LENS PLACEMENT ([place])  Left  Complicated;  Surgeon: Leandrew Koyanagi, MD;  Location: [facility];  Service: Ophthalmology;  Laterality: Left;  [place] Uses oxygen   . CHOLECYSTECTOMY  92  . COLONOSCOPY    . CORONARY ANGIOGRAPHY N/A 06/16/2017   Procedure: CORONARY ANGIOGRAPHY;  Surgeon: Larey Dresser, MD;  Location: [facility];  Service: Cardiovascular;  Laterality: N/A;  . CORONARY ANGIOPLASTY WITH STENT PLACEMENT  07/2009   stent  . CORONARY STENT INTERVENTION N/A 06/16/2017   Procedure: CORONARY STENT INTERVENTION;  Surgeon: Wellington Hampshire, MD;  Location: [facility];  Service: Cardiovascular;  Laterality: N/A;  . DILATION AND CURETTAGE OF UTERUS  1984  . INCISIONAL HERNIA REPAIR N/A 05/16/2013   Procedure: HERNIA REPAIR INFRAUMILICAL INCISIONAL;  Surgeon: Joyice Faster. Cornett, MD;  Location: [facility];   Service: General;  Laterality: N/A;  umbilical  . INSERTION OF MESH N/A 05/16/2013   Procedure: INSERTION OF MESH;  Surgeon: Joyice Faster. Cornett, MD;  Location: [facility];  Service: General;  Laterality: N/A;  umbilical  . JOINT REPLACEMENT  2007   right total knee replacement  . RIGHT/LEFT HEART CATH AND CORONARY ANGIOGRAPHY N/A 06/16/2017   Procedure: RIGHT/LEFT HEART CATH AND CORONARY ANGIOGRAPHY;  Surgeon: Larey Dresser, MD;  Location: [facility];  Service: Cardiovascular;  Laterality: N/A;  . TOTAL KNEE ARTHROPLASTY  2010   left , then vocal cord infection post op  . VIDEO BRONCHOSCOPY Bilateral 07/05/2013   Procedure: VIDEO BRONCHOSCOPY WITH FLUORO;  Surgeon: Tanda Rockers, MD;  Location: [facility];  Service: Cardiopulmonary;  Laterality: Bilateral;  . vocal cord polypectomy     Social History   Tobacco Use  . Smoking status: Former Smoker    Packs/day: 1.00    Years: 25.00    Pack years: 25.00    Types: Cigarettes    Last attempt to quit: 07/27/1982    Years since quitting: 35.9  . Smokeless tobacco: Never Used  Substance Use Topics  . Alcohol use: Yes    Alcohol/week: 0.0 standard drinks    Comment: wine-rare  . Drug use: No   Family History  Problem Relation Age of Onset  . Stroke Mother   . Diabetes Mother 27  . Stroke Father   . Heart failure Father   . Breast cancer Maternal Grandmother   . Colon cancer Neg Hx    Allergies  Allergen Reactions  . Fish Allergy Anaphylaxis  . Shellfish Allergy Anaphylaxis  . Aspirin     REACTION: nausea and vomiting High doses  . Atorvastatin     REACTION: leg pain  . Crestor [Rosuvastatin Calcium] Other (See Comments)    Muscle pain - allergy/intolerance  . Penicillins     Due to mold allergy per pt  . Simvastatin     REACTION: muscle pain  .  Trandolapril     REACTION: leg pain   Current Outpatient Medications on File Prior to Visit  Medication Sig Dispense Refill  . acetaminophen  (TYLENOL) 325 MG tablet Take 650 mg by mouth every 6 (six) hours as needed for mild pain. Take as needed per bottle     . albuterol (PROAIR HFA) 108 (90 Base) MCG/ACT inhaler INHALE 2 PUFFS BY MOUTH EVERY 4 HOURS AS NEEDED FOR WHEEZING OR FOR SHORTNESS OF BREATH 8 Inhaler 5  . aspirin 81 MG tablet Take 81 mg by mouth daily.    . bisoprolol (ZEBETA) 5 MG tablet Take 1.5 tablets (7.5 mg total) by mouth daily. 45 tablet 6  . chlorpheniramine (CHLOR-TRIMETON) 4 MG tablet Take 4 mg by mouth 2 (two) times daily as needed for allergies.    . cyanocobalamin (,VITAMIN B-12,) 1000 MCG/ML injection Inject 1,000 mcg into the muscle every 3 (three) months.    . EPINEPHrine (EPIPEN 2-PAK) 0.3 mg/0.3 mL IJ SOAJ injection INJECT 0.3 MLS (0.3 MG TOTAL) INTO THE MUSCLE ONCE AS NEEDED FOR ALLERGIC REACTION 2 Device 1  . famotidine (PEPCID) 20 MG tablet Take 40 mg by mouth daily.    . furosemide (LASIX) 20 MG tablet Take 1 tablet (20 mg total) by mouth daily. 39 tablet 9  . OXYGEN 24/7 2 lpm  [facility]    . predniSONE (DELTASONE) 5 MG tablet TAKE 1 TABLET BY MOUTH EVERY DAY WITH BREAKFAST (Patient taking differently: No sig reported) 90 tablet 1  . sacubitril-valsartan (ENTRESTO) 97-103 MG Take 1 tablet by mouth 2 (two) times daily. 60 tablet 6  . spironolactone (ALDACTONE) 25 MG tablet Take 0.5 tablets (12.5 mg total) by mouth daily. 15 tablet 5   No current facility-administered medications on file prior to visit.      Review of Systems  Constitutional: Negative for activity change, appetite change, fever and unexpected weight change.  HENT: Positive for postnasal drip and rhinorrhea. Negative for congestion, ear pain, nosebleeds, sinus pressure, sore throat and trouble swallowing.   Eyes: Negative for pain, redness, itching and visual disturbance.  Respiratory: Positive for cough and shortness of breath. Negative for chest tightness and wheezing.   Cardiovascular: Negative for chest pain and palpitations.    Gastrointestinal: Negative for abdominal pain, blood in stool, constipation, diarrhea, nausea and vomiting.  Endocrine: Negative for cold intolerance, heat intolerance, polydipsia and polyuria.  Genitourinary: Negative for difficulty urinating, dysuria, frequency and urgency.  Musculoskeletal: Positive for arthralgias. Negative for back pain, joint swelling and myalgias.  Skin: Positive for rash. Negative for pallor.  Allergic/Immunologic: Negative for environmental allergies and immunocompromised state.  Neurological: Negative for dizziness, tremors, syncope, weakness, numbness and headaches.  Hematological: Negative for adenopathy. Does not bruise/bleed easily.  Psychiatric/Behavioral: Negative for decreased concentration and dysphoric mood. The patient is not nervous/anxious.        Objective:   Physical Exam Constitutional:      General: She is not in acute distress.    Appearance: Normal appearance. She is well-developed. She is obese. She is not ill-appearing.     Comments: Fatigued with minimal exertion   HENT:     Head: Normocephalic and atraumatic.     Right Ear: Tympanic membrane and external ear normal.     Left Ear: Tympanic membrane and external ear normal.     Nose: Nose normal.     Comments: Boggy nares with 02 [place]    Mouth/Throat:     Mouth: Mucous membranes are moist.  Pharynx: Oropharynx is clear.  Eyes:     General: No scleral icterus.       Right eye: No discharge.        Left eye: No discharge.     Conjunctiva/sclera: Conjunctivae normal.     Pupils: Pupils are equal, round, and reactive to light.  Neck:     Musculoskeletal: Normal range of motion and neck supple.     Thyroid: No thyromegaly.     Vascular: No carotid bruit or JVD.  Cardiovascular:     Rate and Rhythm: Normal rate and regular rhythm.     Heart sounds: Normal heart sounds. No gallop.   Pulmonary:     Effort: Pulmonary effort is normal. No respiratory distress.     Breath sounds: Normal  breath sounds. No stridor. No wheezing, rhonchi or rales.     Comments: Diffusely distant bs  No wheezes Abdominal:     General: Bowel sounds are normal. There is no distension.     Palpations: Abdomen is soft. There is no mass.     Tenderness: There is no abdominal tenderness.  Genitourinary:    Comments: Declined breast exam  Musculoskeletal:        General: No tenderness.  Lymphadenopathy:     Cervical: No cervical adenopathy.  Skin:    General: Skin is warm and dry.     Coloration: Skin is not pale.     Findings: No erythema or rash.  Neurological:     General: No focal deficit present.     Mental Status: She is alert.     Cranial Nerves: No cranial nerve deficit.     Motor: No abnormal muscle tone.     Coordination: Coordination normal.     Deep Tendon Reflexes: Reflexes are normal and symmetric.  Psychiatric:        Mood and Affect: Mood normal.           Assessment & Plan:   Problem List Items Addressed This Visit      Cardiovascular and Mediastinum   Essential hypertension    bp in fair control at this time  BP Readings from Last 1 Encounters:  07/08/18 130/64   No changes needed Most recent labs reviewed  Disc lifstyle change with low sodium diet and exercise        Relevant Medications   ezetimibe (ZETIA) 10 MG tablet     Respiratory   BOOP (bronchiolitis obliterans with organizing pneumonia) ([place])    Pt states she no longer has this  Still 02 dependent and very slowly weaning prednisone  Suspect she would benefit again from pulmonary rehab        Endocrine   Diabetes mellitus type 2 in obese [facility])    Very well controlled with her most recent insulin regimen /lantus  Will need this as long as she stays on prednisone  Eye exam utd Good foot care  Entresto for renal protection        Relevant Medications   insulin glargine (LANTUS) 100 UNIT/ML injection     Musculoskeletal and Integument   Osteopenia    dexa 9/18 -on prednisone    Would like to consider Prolia  Pt will d/w pulmonary -re: any risks when used with prednisone Disc ca and D intake  No falls or fx -uses walker when unsteady      Dermatitis    On and behind ears-presumably from all rxn to her 02 cord material (now clipping on to a hat)  Will disc with pulm - ? If can change materials Px triamcinolone cream to use prn and update         Other   Routine general medical examination at a health care facility - Primary    Reviewed health habits including diet and exercise and skin cancer prevention Reviewed appropriate screening tests for age  Also reviewed health mt list, fam hx and immunization status , as well as social and family history   Labs rev  amw rev  Chronic medical problems are stable  Tries to be active  She will ask pulmonary about safety of Prolia for OP and also the shingrix vaccine  Also ask cardiology if safe to take D and Ca      Morbid obesity due to excess calories ([place]) comp by hbp/ hyperlipidemia/ dm/ihd    Pt struggles with this in light of prednisone [place] for improvement if she can come off of it Enc activity as tolerated (very low pulm stamina)   Discussed how this problem influences overall health and the risks it imposes  Reviewed plan for weight loss with lower calorie diet (via better food choices and also portion control or program like weight watchers) and exercise building up to or more than 30 minutes 5 days per week including some aerobic activity         Relevant Medications   insulin glargine (LANTUS) 100 UNIT/ML injection   B12 deficiency    Lab Results  Component Value Date   VITAMINB12 514 07/06/2018   Doing well with injection every 3 mo

## 2018-07-10 NOTE — Assessment & Plan Note (Signed)
Very well controlled with her most recent insulin regimen /lantus  Will need this as long as she stays on prednisone  Eye exam utd Good foot care  Entresto for renal protection

## 2018-07-10 NOTE — Assessment & Plan Note (Signed)
Pt states she no longer has this  Still 02 dependent and very slowly weaning prednisone  Suspect she would benefit again from pulmonary rehab

## 2018-07-10 NOTE — Assessment & Plan Note (Signed)
On and behind ears-presumably from all rxn to her 02 cord material (now clipping on to a hat) Will disc with pulm - ? If can change materials Px triamcinolone cream to use prn and update

## 2018-07-10 NOTE — Assessment & Plan Note (Signed)
Pt struggles with this in light of prednisone [place] for improvement if she can come off of it Enc activity as tolerated (very low pulm stamina)   Discussed how this problem influences overall health and the risks it imposes  Reviewed plan for weight loss with lower calorie diet (via better food choices and also portion control or program like weight watchers) and exercise building up to or more than 30 minutes 5 days per week including some aerobic activity

## 2018-07-10 NOTE — Assessment & Plan Note (Signed)
dexa 9/18 -on prednisone  Would like to consider Prolia  Pt will d/w pulmonary -re: any risks when used with prednisone Disc ca and D intake  No falls or fx -uses walker when unsteady

## 2018-07-10 NOTE — Assessment & Plan Note (Signed)
Lab Results  Component Value Date   [id number] 07/06/2018   Doing well with injection every 3 mo

## 2018-07-10 NOTE — Assessment & Plan Note (Signed)
bp in fair control at this time  BP Readings from Last 1 Encounters:  07/08/18 130/64   No changes needed Most recent labs reviewed  Disc lifstyle change with low sodium diet and exercise

## 2018-07-12 ENCOUNTER — Encounter: Payer: Self-pay | Admitting: Internal Medicine

## 2018-07-12 ENCOUNTER — Ambulatory Visit: Payer: Medicare Other | Admitting: Internal Medicine

## 2018-07-12 ENCOUNTER — Other Ambulatory Visit: Payer: Self-pay | Admitting: *Deleted

## 2018-07-12 VITALS — BP 132/62 | HR 64 | Ht 64.5 in | Wt 246.0 lb

## 2018-07-12 DIAGNOSIS — Z789 Other specified health status: Secondary | ICD-10-CM | POA: Diagnosis not present

## 2018-07-12 DIAGNOSIS — E785 Hyperlipidemia, unspecified: Secondary | ICD-10-CM

## 2018-07-12 DIAGNOSIS — I5022 Chronic systolic (congestive) heart failure: Secondary | ICD-10-CM | POA: Diagnosis not present

## 2018-07-12 DIAGNOSIS — I2510 Atherosclerotic heart disease of native coronary artery without angina pectoris: Secondary | ICD-10-CM

## 2018-07-12 NOTE — Telephone Encounter (Signed)
From past refills I can't tell if DR. Tower is going to be filling Rx or is pt's cardiologist suppose to fill med. Last filled by hospital on 07/10/17 #39 tabs with 9 refills, please advise    [facility] [address]

## 2018-07-12 NOTE — Patient Instructions (Signed)
Medication Instructions:   Dr. Debara Pickett recommends Repatha (PCSK9). This is an injectable cholesterol medication. This medication will need prior approval with your insurance company, which we will work on. If the medication is not approved initially, we may need to do an appeal with your insurance. We will keep you updated on this process. This medication can be provided at some local pharmacies or be shipped to you from a specialty pharmacy.   If you need a refill on your cardiac medications before your next appointment, please call your pharmacy.   Lab work: FASTING lab work in 3-4 months (prior to next visit with Dr. Debara Pickett) If you have labs (blood work) drawn today and your tests are completely normal, you will receive your results only by: Marland Kitchen MyChart Message (if you have MyChart) OR . A paper copy in the mail If you have any lab test that is abnormal or we need to change your treatment, we will call you to review the results.  Follow-Up: At [facility], you and your health needs are our priority.  As part of our continuing mission to provide you with exceptional heart care, we have created designated Provider Care Teams.  These Care Teams include your primary Cardiologist (physician) and Advanced Practice Providers (APPs -  Physician Assistants and Nurse Practitioners) who all work together to provide you with the care you need, when you need it. You will need a follow up appointment in 3-4 months with Dr. Debara Pickett ([place])

## 2018-07-12 NOTE — Telephone Encounter (Signed)
I do not mind filling it -please confirm with pt that she is still taking this dose and inst have not changed from cardiology   If it is correct-can refill for a year Thanks

## 2018-07-13 MED ORDER — FUROSEMIDE 20 MG PO TABS: 20.0000 mg | ORAL_TABLET | Freq: Every day | ORAL | 3 refills | Status: DC

## 2018-07-13 NOTE — Telephone Encounter (Signed)
Spoke to pt. She is taking it 1 every other day right now. But, she can always be increased to more. Rx sent to pharmacy

## 2018-07-14 ENCOUNTER — Encounter: Payer: Self-pay | Admitting: Internal Medicine

## 2018-07-14 NOTE — Progress Notes (Signed)
LIPID CLINIC CONSULT NOTE  Chief Complaint:  Dyslipidemia  Primary Care Physician: Tower, Wynelle Fanny, MD  HPI:  Kelli Velasquez is a 77 y.o. female who is being seen today for the evaluation of dyslipidemia at the request of Dr. Aundra Dubin.  This is a pleasant 77 year old female was kindly referred for evaluation of dyslipidemia.  She has a prior history of coronary artery disease with cath in January 2011 and had placement of drug-eluting stents in the LAD and RCA.  She is an established patient of Dr. Aundra Dubin in the heart failure clinic and has an ischemic cardiomyopathy with LVEF 40 to 45%.  In addition she has dyslipidemia which is not at goal.  She has been intolerant to statins, including atorvastatin, rosuvastatin, simvastatin and most recently pravastatin.  Her most recent lipid profile showed total cholesterol 188, triglycerides 305, HDL 40 and LDL 87, however this is on pravastatin however she recently discontinued it again due to myalgias.  She is referred for evaluation of possible PCSK9 inhibitor therapy.  PMHx:  Past Medical History:  Diagnosis Date  . Allergy    allergic rhinitis  . Asthma   . Chronic bronchitis ([place])   . Coronary artery disease    cath January 2011 with DEs LAD and RCA  . Dyspnea   . Full dentures   . Gallstones   . GERD (gastroesophageal reflux disease)   . History of echocardiogram    Echo 5/17: mod LVH, EF 50-55%, ant-septal HK, Gr 1 DD, mod LAE //  b.  Echo 7/17: mild concentric LVH, EF 45-50%, inf-lat, inf, inf-septal HK, mild LAE  . History of nuclear stress test    Myoview 7/17: EF 48%, small mild apical defect, no ischemia, low risk  . Hyperlipidemia   . Hypertension   . 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 Myopia   . 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 Nocturnal oxygen desaturation    o2 at night  . Obesity   . Osteoarthritis    knees, fingers, shoulders  .  Overactive bladder   . Oxygen deficiency    uses oxygen all day  . Rash    and other non specific skin eruptions  . Retaining fluid    in ankles and feet  . Urinary incontinence   . Vertigo   . Wears glasses     Past Surgical History:  Procedure Laterality Date  . CATARACT EXTRACTION W/PHACO Right 12/16/2016   Procedure: CATARACT EXTRACTION PHACO AND INTRAOCULAR LENS PLACEMENT ([place])  right;  Surgeon: Leandrew Koyanagi, MD;  Location: [facility];  Service: Ophthalmology;  Laterality: Right;  . CATARACT EXTRACTION W/PHACO Left 02/03/2017   Procedure: CATARACT EXTRACTION PHACO AND INTRAOCULAR LENS PLACEMENT ([place])  Left  Complicated;  Surgeon: Leandrew Koyanagi, MD;  Location: [facility];  Service: Ophthalmology;  Laterality: Left;  [place] Uses oxygen   . CHOLECYSTECTOMY  92  . COLONOSCOPY    . CORONARY ANGIOGRAPHY N/A 06/16/2017   Procedure: CORONARY ANGIOGRAPHY;  Surgeon: Larey Dresser, MD;  Location: [facility];  Service: Cardiovascular;  Laterality: N/A;  . CORONARY ANGIOPLASTY WITH STENT PLACEMENT  07/2009   stent  . CORONARY STENT INTERVENTION N/A 06/16/2017   Procedure: CORONARY STENT INTERVENTION;  Surgeon: Wellington Hampshire, MD;  Location: [facility];  Service: Cardiovascular;  Laterality: N/A;  . DILATION AND CURETTAGE OF UTERUS  1984  .  INCISIONAL HERNIA REPAIR N/A 05/16/2013   Procedure: HERNIA REPAIR INFRAUMILICAL INCISIONAL;  Surgeon: Joyice Faster. Cornett, MD;  Location: [facility];  Service: General;  Laterality: N/A;  umbilical  . INSERTION OF MESH N/A 05/16/2013   Procedure: INSERTION OF MESH;  Surgeon: Joyice Faster. Cornett, MD;  Location: [facility];  Service: General;  Laterality: N/A;  umbilical  . JOINT REPLACEMENT  2007   right total knee replacement  . RIGHT/LEFT HEART CATH AND CORONARY ANGIOGRAPHY N/A 06/16/2017   Procedure: RIGHT/LEFT HEART CATH AND CORONARY ANGIOGRAPHY;  Surgeon: Larey Dresser, MD;  Location: [facility];  Service: Cardiovascular;  Laterality: N/A;  . TOTAL KNEE ARTHROPLASTY  2010   left , then vocal cord infection post op  . VIDEO BRONCHOSCOPY Bilateral 07/05/2013   Procedure: VIDEO BRONCHOSCOPY WITH FLUORO;  Surgeon: Tanda Rockers, MD;  Location: [facility];  Service: Cardiopulmonary;  Laterality: Bilateral;  . vocal cord polypectomy      FAMHx:  Family History  Problem Relation Age of Onset  . Stroke Mother   . Diabetes Mother 62  . Stroke Father   . Heart failure Father   . Breast cancer Maternal Grandmother   . Colon cancer Neg Hx     SOCHx:   reports that she quit smoking about 35 years ago. Her smoking use included cigarettes. She has a 25.00 pack-year smoking history. She has never used smokeless tobacco. She reports current alcohol use. She reports that she does not use drugs.  ALLERGIES:  Allergies  Allergen Reactions  . Fish Allergy Anaphylaxis  . Shellfish Allergy Anaphylaxis  . Aspirin     REACTION: nausea and vomiting High doses  . Atorvastatin     REACTION: leg pain  . Crestor [Rosuvastatin Calcium] Other (See Comments)    Muscle pain - allergy/intolerance  . Penicillins     Due to mold allergy per pt  . Simvastatin     REACTION: muscle pain  . Trandolapril     REACTION: leg pain    ROS: Pertinent items noted in HPI and remainder of comprehensive ROS otherwise negative.  HOME MEDS: Current Outpatient Medications on File Prior to Visit  Medication Sig Dispense Refill  . acetaminophen (TYLENOL) 325 MG tablet Take 650 mg by mouth every 6 (six) hours as needed for mild pain. Take as needed per bottle     . albuterol (PROAIR HFA) 108 (90 Base) MCG/ACT inhaler INHALE 2 PUFFS BY MOUTH EVERY 4 HOURS AS NEEDED FOR WHEEZING OR FOR SHORTNESS OF BREATH 8 Inhaler 5  . aspirin 81 MG tablet Take 81 mg by mouth daily.    . bisoprolol (ZEBETA) 5 MG tablet Take 1.5 tablets (7.5 mg total) by mouth daily. 45 tablet 6  .  chlorpheniramine (CHLOR-TRIMETON) 4 MG tablet Take 4 mg by mouth 2 (two) times daily as needed for allergies.    Marland Kitchen clopidogrel (PLAVIX) 75 MG tablet Take 1 tablet (75 mg total) by mouth daily. 90 tablet 3  . cyanocobalamin (,VITAMIN B-12,) 1000 MCG/ML injection Inject 1,000 mcg into the muscle every 3 (three) months.    . EPINEPHrine (EPIPEN 2-PAK) 0.3 mg/0.3 mL IJ SOAJ injection INJECT 0.3 MLS (0.3 MG TOTAL) INTO THE MUSCLE ONCE AS NEEDED FOR ALLERGIC REACTION 2 Device 1  . esomeprazole (NEXIUM) 40 MG capsule Take 1 capsule (40 mg total) by mouth daily. 90 capsule 3  . ezetimibe (ZETIA) 10 MG tablet TAKE 1 TABLET (10 MG TOTAL) BY MOUTH DAILY.  90 tablet 3  . famotidine (PEPCID) 20 MG tablet Take 40 mg by mouth daily.    . insulin glargine (LANTUS) 100 UNIT/ML injection Inject 0.22 mLs (22 Units total) into the skin daily. 10 mL 5  . OXYGEN 24/7 2 lpm  [employer]    . predniSONE (DELTASONE) 5 MG tablet TAKE 1 TABLET BY MOUTH EVERY DAY WITH BREAKFAST (Patient taking differently: No sig reported) 90 tablet 1  . sacubitril-valsartan (ENTRESTO) 97-103 MG Take 1 tablet by mouth 2 (two) times daily. 60 tablet 6  . spironolactone (ALDACTONE) 25 MG tablet Take 0.5 tablets (12.5 mg total) by mouth daily. 15 tablet 5  . triamcinolone cream (KENALOG) 0.1 % Apply 1 application topically daily. To affected areas of ears 30 g 1   No current facility-administered medications on file prior to visit.     LABS/IMAGING: No results found for this or any previous visit (from the past 48 hour(s)). No results found.  LIPID PANEL:    Component Value Date/Time   CHOL 188 07/06/2018 1216   TRIG 305.0 (H) 07/06/2018 1216   HDL 40.60 07/06/2018 1216   CHOLHDL 5 07/06/2018 1216   VLDL 61.0 (H) 07/06/2018 1216   LDLCALC 87 07/01/2018 1117   LDLDIRECT 121.0 07/06/2018 1216    WEIGHTS: Wt Readings from Last 3 Encounters:  07/12/18 246 lb (111.6 kg)  07/08/18 241 lb 8 oz (109.5 kg)  07/06/18 243 lb (110.2 kg)     VITALS: BP 132/62   Pulse 64   Ht 5' 4.5" (1.638 m)   Wt 246 lb (111.6 kg)   LMP 07/27/1980   SpO2 92%   BMI 41.57 kg/m   EXAM: General appearance: alert and no distress Neck: no JVD, supple, symmetrical, trachea midline and thyroid not enlarged, symmetric, no tenderness/mass/nodules Lungs: clear to auscultation bilaterally Heart: regular rate and rhythm, S1, S2 normal, no murmur, click, rub or gallop Abdomen: soft, non-tender; bowel sounds normal; no masses,  no organomegaly Extremities: extremities normal, atraumatic, no cyanosis or edema Pulses: 2+ and symmetric Skin: Skin color, texture, turgor normal. No rashes or lesions Neurologic: Grossly normal Psych: Pleasant  EKG: Deferred  ASSESSMENT: 1. Coronary artery disease status post PCI x2 (2011) 2. Ischemic cardiomyopathy EF 40 to 45% 3. Mixed dyslipidemia -goal LDL less than 70 4. Statin intolerant  PLAN: 1.   Ms. Mattila has been statin intolerant and has a dyslipidemia with goal LDL less than 70.  Her most recent lipid profile shows elevated triglycerides as well as an LDL of 87.  She is on ezetimibe 10 mg daily and was recently on pravastatin however that was discontinued due to side effects.  She is a good candidate for PCSK9 inhibitor therapy which we will pursue.  I have also considered her a possible candidate for Vascepa which recently ordered FDA approval to treat patients with coronary disease and elevated triglycerides over 150 despite statin therapy and optimization.  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  Plan follow-up lipid profile in about 3 to 4 months.  Thanks again for the kind referral.  Pixie Casino, MD, FACC, [facility] Director of the [facility]  [facility] Diplomate of the American Board of Clinical Lipidology Attending Cardiologist  Direct Dial: [phone]  Fax:  [phone]  Website:  [email or web address] 07/14/2018, 4:15 PM

## 2018-07-15 ENCOUNTER — Ambulatory Visit (HOSPITAL_COMMUNITY)
Admission: RE | Admit: 2018-07-15 | Discharge: 2018-07-15 | Disposition: A | Discharge status: Home / Self Care | Payer: Medicare Other | Source: Ambulatory Visit | Attending: Internal Medicine | Admitting: Internal Medicine

## 2018-07-15 DIAGNOSIS — R0902 Hypoxemia: Secondary | ICD-10-CM | POA: Diagnosis not present

## 2018-07-15 DIAGNOSIS — I5022 Chronic systolic (congestive) heart failure: Secondary | ICD-10-CM | POA: Diagnosis not present

## 2018-07-15 LAB — BASIC METABOLIC PANEL
Anion gap: 9 (ref 5–15)
BUN: 24 mg/dL — ABNORMAL HIGH (ref 8–23)
CALCIUM: 9.7 mg/dL (ref 8.9–10.3)
CO2: 28 mmol/L (ref 22–32)
Chloride: 105 mmol/L (ref 98–111)
Creatinine, Ser: 1.26 mg/dL — ABNORMAL HIGH (ref 0.44–1.00)
GFR calc Af Amer: 47 mL/min — ABNORMAL LOW (ref >60)
GFR, EST NON AFRICAN AMERICAN: 41 mL/min — AB (ref >60)
Glucose, Bld: 119 mg/dL — ABNORMAL HIGH (ref 70–99)
Potassium: 4.6 mmol/L (ref 3.5–5.1)
Sodium: 142 mmol/L (ref 135–145)

## 2018-07-18 ENCOUNTER — Telehealth: Payer: Self-pay | Admitting: Internal Medicine

## 2018-07-18 NOTE — Telephone Encounter (Signed)
PA for repatha sureclick submitted via [email or web address]  (Key: [id number])

## 2018-07-22 NOTE — Telephone Encounter (Signed)
Per [employer], patient has been approved for Repatha SureClick until 7/91/5056.  Notified patient via [place]

## 2018-07-25 MED ORDER — EVOLOCUMAB 140 MG/ML ~~LOC~~ SOAJ: 1.0000 | SUBCUTANEOUS | 11 refills | Status: DC

## 2018-07-25 NOTE — Addendum Note (Signed)
Addended by: Fidel Levy on: 07/25/2018 07:28 AM   Modules accepted: Orders

## 2018-07-25 NOTE — Telephone Encounter (Signed)
Rx sent to [facility] in [place] per patient MyChart message request

## 2018-08-04 NOTE — Telephone Encounter (Signed)
Sent MyChart message to patient to f/up on Repatha - if it was received, started, tolerated?

## 2018-08-04 NOTE — Telephone Encounter (Signed)
Patient sent MyChart message stating she has not received medication but it has been ordered. Called [facility] to f/[facility] on this. They report her copay is $24.27 per month supply and they will call her when med is ready.   Patient notified via MyChart message

## 2018-08-04 NOTE — Telephone Encounter (Signed)
[facility] patient

## 2018-08-15 DIAGNOSIS — R0902 Hypoxemia: Secondary | ICD-10-CM | POA: Diagnosis not present

## 2018-08-18 ENCOUNTER — Ambulatory Visit: Payer: Medicare Other | Admitting: Internal Medicine

## 2018-08-18 ENCOUNTER — Ambulatory Visit
Admission: RE | Admit: 2018-08-18 | Discharge: 2018-08-18 | Disposition: A | Discharge status: Home / Self Care | Payer: Medicare Other | Source: Ambulatory Visit | Attending: Internal Medicine | Admitting: Internal Medicine

## 2018-08-18 ENCOUNTER — Encounter: Payer: Self-pay | Admitting: Internal Medicine

## 2018-08-18 VITALS — BP 130/68 | HR 67 | Ht 64.5 in | Wt 243.2 lb

## 2018-08-18 DIAGNOSIS — R918 Other nonspecific abnormal finding of lung field: Secondary | ICD-10-CM | POA: Diagnosis not present

## 2018-08-18 DIAGNOSIS — J9612 Chronic respiratory failure with hypercapnia: Secondary | ICD-10-CM

## 2018-08-18 DIAGNOSIS — J9611 Chronic respiratory failure with hypoxia: Secondary | ICD-10-CM

## 2018-08-18 NOTE — Assessment & Plan Note (Signed)
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 > discussed with IR, rec FOB > done 07/05/2013 > nl airways > neg afb/fungus/path  - IR Bx 07/11/2013 > inflammatory, special stains pending but does not look like tb or fungus and responds short course pred per pt > rec prednisone x 6 days only and needs VATS bx but declines as of 07/13/2013 > rec'd final path from [facility] review > c/w BOOP though can't exclude asp/infection sources - 07/19/2013 notified pt and will return 07/24/13 for cxr/ esr then consider steroid rx (needs crypto serology also) - 07/24/2013 ESR 42 off pred x 3 weeks - 07/24/13 > crypto serology neg/ Anti-CCP neg  - started maint   prednisone 09/06/2013  >>> cxr cleared 10/18/2013 on 20 mg per day > rec taper to 20/10 - 11/28/13 reduced to 10 mg daily  - 03/07/2014 5 mg x 2 weeks then 5 mg qod x 2 weeks and stopped pred  04/10/14  - ESR 04/16/2015  = 24 / recurrent nodules > rec pred 20 mg daily > nodules resolved on f/u cxr 06/14/2015  - 04/30/2015 rec ceiling of 20 and floor of 10/5  - ESR 11/04/2015 =  23  On pred 10/5, rec taper off x 6 weeks if tol> flared in June 2017 so resumed last week in June 2017 20 ceiling/ 10/5 floor - ESR 05/07/2016   =  17 on prednisone 20 mg daily> rec 20/10 > did not tol  - ESR 11/20/2016  =  21 on pred 20/15 so rec 15 mg daily  - ESR 01/11/2017  =  46 with ? Mild  Flare on pred 5 so rec 10 until better then taper to floor of 10 /5  > did not tol - ESR 08/16/2017 =   54 but cxr / exam ok On pred 10 mg daily>  rec try 10/5 again with ceiling of 20 mg per day if worse  - ESR 02/14/2018  = 38 on 10-5-5 > stay on 5 mg daily   - ESR 05/17/2018 = 25 on 5 mg daily so rec 5/2.5 x 1 month then 2.5 mg daily    The goal with a chronic steroid dependent illness is always arriving at the lowest effective dose that controls the disease/symptoms and not accepting a set  "formula" which is based on statistics or guidelines that don't always take into account patient  variability or the natural hx of the dz in every individual patient, which may well vary over time.  For now therefore I recommend the patient slowly taper to what is a physiologic dose = 2.5 mg daily prior to next ov.  If symptoms flare with taper rec rheumatology eval   

## 2018-08-18 NOTE — Assessment & Plan Note (Signed)
Body mass index is 41.1 kg/m.  -  trending no change  Lab Results  Component Value Date   TSH 2.25 07/06/2018     Contributing to gerd risk/ doe/ and should be getting better as pred is tapered but so fare this is not the case and now she is limited by back paint   Rec: Defer w/u for back pain to PCP reviewed the need and the process to achieve and maintain neg calorie balance > defer f/u primary care including intermittently monitoring thyroid status    F/u 6 weeks an in meantime work on tapering steroids completely off if possbile    I had an extended discussion with the patient reviewing all relevant studies completed to date and  lasting 15 to 20 minutes of a 25 minute visit    Each maintenance medication was reviewed in detail including most importantly the difference between maintenance and prns and under what circumstances the prns are to be triggered using an action plan format that is not reflected in the computer generated alphabetically organized AVS.     Please see AVS for specific instructions unique to this visit that I personally wrote and verbalized to the the pt in detail and then reviewed with pt  by my nurse highlighting any  changes in therapy recommended at today's visit to their plan of care.

## 2018-08-18 NOTE — Patient Instructions (Addendum)
Try prednisone 5 mg take one half daily   Please schedule a follow up office visit in 6 weeks, call sooner if needed with cxr and pfts on return

## 2018-08-18 NOTE — Assessment & Plan Note (Addendum)
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 > discussed with IR, rec FOB > done 07/05/2013 > nl airways > neg afb/fungus/path  - IR Bx 07/11/2013 > inflammatory, special stains pending but does not look like tb or fungus and responds short course pred per pt > rec prednisone x 6 days only and needs VATS bx but declines as of 07/13/2013 > rec'd final path from [facility] review > c/w BOOP though can't exclude asp/infection sources - 07/19/2013 notified pt and will return 07/24/13 for cxr/ esr then consider steroid rx (needs crypto serology also) - 07/24/2013 ESR 42 off pred x 3 weeks - 07/24/13 > crypto serology neg/ Anti-CCP neg  - started maint   prednisone 09/06/2013  >>> cxr cleared 10/18/2013 on 20 mg per day > rec taper to 20/10 - 11/28/13 reduced to 10 mg daily  - 03/07/2014 5 mg x 2 weeks then 5 mg qod x 2 weeks and stopped pred  04/10/14  - ESR 04/16/2015  = 24 / recurrent nodules > rec pred 20 mg daily > nodules resolved on f/u cxr 06/14/2015  - 04/30/2015 rec ceiling of 20 and floor of 10/5  - ESR 11/04/2015 =  23  On pred 10/5, rec taper off x 6 weeks if tol> flared in June 2017 so resumed last week in June 2017 20 ceiling/ 10/5 floor - ESR 05/07/2016   =  17 on prednisone 20 mg daily> rec 20/10 > did not tol  - ESR 11/20/2016  =  21 on pred 20/15 so rec 15 mg daily  - ESR 01/11/2017  =  46 with ? Mild  Flare on pred 5 so rec 10 until better then taper to floor of 10 /5  > did not tol - ESR 08/16/2017 =   54 but cxr / exam ok On pred 10 mg daily>  rec try 10/5 again with ceiling of 20 mg per day if worse  - ESR 02/14/2018  = 38 on 10-5-5 > stay on 5 mg daily  - ESR 05/17/2018 = 25 on 5 mg daily so rec 5/2.5 x 1 month then 2.5 mg daily  - 08/18/2018 try 2.5 mg daily    No change on lower dose of pred and near physiologic levels now. The goal with a chronic steroid dependent  illness is always arriving at the lowest effective dose that controls the disease/symptoms and not accepting a set "formula" which is based on statistics or guidelines that don't always take into account patient  variability or the natural hx of the dz in every individual patient, which may well vary over time.  For now therefore I recommend the patient maintain  2.5 mg daily and return in 6 weeks with pfts/ cxr.esr  baseline then taper off completely  At that point if no inflammatory or adrenal issues   Discussed in detail all the  indications, usual  risks and alternatives  relative to the benefits with patient who agrees to proceed with rxas outlined

## 2018-08-18 NOTE — Assessment & Plan Note (Signed)
11/24/2012 Office walk showed desats 86% on RA  With  spirometry FEV1 nml-73% , ratio 80  12/07/12 ONO > Pos 5: 12min  begin O2 2 l/m At bedtime  12/13/2012  >  Repeat 02/01/13 on 2lpm 10min> no change rx  - 01/31/2013  Walked RA x 3 laps @ 185 ft each stopped due to  End of study, no desats  - 07/03/2013 sats 75% RA >  On 3lpm sats 93%  > rx with 24 h 02 at 3lpm   - improved by self monitoring as of 07/24/13 ov so changed rx to 2lpm/24/7 humidified due to nasal dryness - 09/06/2013  Walked 2lpm x  2 laps @ 185 ft each stopped due to legs tired, no desats    - 04/18/2014  Walked RA  @ mod pace x 2 laps @ 185 ft each stopped due to  Hip pain, slow pace, sats 89% at end  - 05/16/2014  [facility] RA  2 laps @ 185 ft each stopped due to sob/fatigue but no desats off pred x 04/10/14  - 04/16/2015  Walked RA x 3 laps @ 185 ft each stopped due to   - ONO RA   10/05/2014 >  02 sat < 89% 213 min so rec resume 2lpm and work on wt loss   - 04/16/2015  Walked RA x 3 laps @ 185 ft each stopped due to  91% End of study, nl pace, no sob or desat - HCO3  01/06/16 = 35    - HCO3  05/07/2016  = 39 assoc with wt gain  - HCO3 01/11/2017     = 40  - HCO3 08/16/2017     =  35 - HC03  08/27/17            = 30  - HC03  03/24/18          = 29  - 05/17/2018   Walked 3lpm POC  x one lap @ 185 stopped due to  Back pain / sats still 92 nl pace  As of  08/18/2018    02 2lpm sleeping, resting/  3lpm with activity

## 2018-08-18 NOTE — Progress Notes (Signed)
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    History of Present Illness  07/03/2013 f/u ov/ re: sob/cough/ wheeze/ MPN Chief Complaint  Patient presents with  . Follow-up    Pt had PET scan done this am. Her cough and SOB have improved since the last visit.   saba inhaler helps some/ min mucoid sputum, temp to 99 but no rigors, no purulent sputum or hemoptysis/ does have some wt. Loss, no sweating.  rec Dulera 100 Take 2 puffs first thing in am and then another 2 puffs about 12 hours later.  Prednisone 10 mg take  4 each am x 2 days,   2 each am x 2 days,  1 each am x 2 days and stop Only use your albuterol as a rescue medication  We will arrange 24/7 02 at 3lpm per advanced  We will call to arrange a biopsy of the easiest area once I discuss this with radiology  Late add: Discussed in detail all the  indications, usual  risks and alternatives  relative to the benefits with patient who agrees to proceed with bronchoscopy with biopsy.   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 Acute Visit    Pt c/o increased SOB, cough, and fatigue for the past 1-2 wks. Her cough is  non prod. She states she wakes up in the am very tired.    gradually sob /cough worse p eating / eats 10 pm, no candy handy and having lots of throat clearing/ sensation of daytime pnds / no better with saba  Doe now = MMRC3 = can't walk 100 yards even at a slow pace at a flat grade s stopping due to sob   rec Please remember to go to the lab and x-ray department downstairs in the basement  for your tests - we will call you with the results when they are available.  For drainage / throat tickle try take CHLORPHENIRAMINE  4 mg - take one every 4 hours as needed GERD  Keep your previous appt  Add :  Consider challenging with symbicort to see if helps cough/ systemic steroid dep    01/11/2017  f/u ov/ re:  BOOP/ steroid dep  -  No med calendar  Chief Complaint  Patient presents with  . Follow-up    3 month follow up. Patient states she is not feeling well. Feeling congested. Coughing. States she is not able to walk 87f on 2L of o2 before feeling out of breath.   gradually tapered pred from 20/15  Started feeling worse  when reduced pred  to 5 mg daily x 3 weeks prior to OV  And weak since. Bad chills x 2 x  1-2 days prior to OV  But s excess/ purulent sputum or mucus plugs  And none in last 24h rec Please remember to go to the lab and x-ray department downstairs in the basement  for your tests - we will call you with the results when they are available. Prednisone 10 mg daily until better or otherwise notified and then when better 10/5 alternating days     Admit date: 07/07/2017 Discharge date: 07/10/2017  Recommendations for Outpatient Follow-up:  Please note that lasix dose is now 20 mg a day due to soft blood pressure in hospital. Follow up with cardio per sch appt to make sure this dose is tolerated. Aldactone is 12.5 mg a day. Continue Cipro for 4 days on discharge for UTI.   Discharge Diagnoses:    Near syncope   Acute kidney injury [facility])   Coronary artery disease    Hypertension   Acute on chronic respiratory failure with hypoxia (HCC)   Chronic systolic heart failure (HCC)   Abnormal urinalysis   Dysphagia   Acute deep vein thrombosis (DVT) of right upper extremity (HCC)   Diabetes mellitus type 2 in obese (HCC)   Acute metabolic encephalopathy   Gluteal cleft wound    08/16/2017  Extended post hosp transition of care  f/u ov/ re:  Re-establish re BOOP steroid dep/ pred 10 mg daily plus new dx ischemic chf  Chief Complaint  Patient presents with  . Hospitalization Follow-up    Breathing is worse today which she relates to colder weather. She has not had to use her rescue inhaler.   doe = MMRC3 = can't walk 100 yards even at a slow pace at a flat grade s stopping due to sob  On up to 3lpm  Sleeping fine/ hoarse but not coughing. rec Try prednisone 10 mg on even days and 5 mg on odd if tolerated        02/14/2018  f/u ov/ re: boop on pred 10 -5 -5  Chief Complaint  Patient presents with  . Follow-up    Breathing is unchanged since the last visit. She rarely uses her albuterol.   Dyspnea:  MMRC3 = can't walk 100 yards even at a slow pace at a flat grade s stopping due to sob  eg has to stop when shopping at [facility] or [employer]/ water aerobics x 1 hour 3 x per week  Cough: not much    SABA use: rare 02: 2lpm at home, 3 pulsed when out  rec Go ahead and reduce to Predisone  5 mg daily  X 2 weeks then 5/2.5 mg alternative days and leave there  Please remember to go to the lab and x-ray department downstairs in the basement  for your tests - we will call you with the results when they are available.   Please schedule a follow up visit in 3 months but call sooner if needed      05/17/2018  f/u ov/ re:   Boop/ steroid resp/? Dependent / never saw rheum on 5 mg pred daily  Chief Complaint  Patient presents with  . Follow-up    States her breathing has been up down, but albuterol has helped.    Dyspnea:  Chair bicycle 3 x wk x 30 min s  stopping on 2lpm but does not check sats with ex as rec  Cough: none Sleeping: 10-15 degrees with one pillow SABA use: rarely  eg once or twice weekly  02: 2lpm cont at home/ 3lpm poc out   rec If you flare at lower prednisone doses we may need to send you to a rheumatologist  Monitor your saturations while on exertion several times a week (your bike is a good time to do this) to see if trending down as prednisone dose decreases  Please schedule a follow up visit in 3 months but call sooner if needed ESR mproved so try 5 mg a/w 2.5 mg daily x 4 weeks then 2.5 mg daily until return in 3 months    08/18/2018  f/u ov/ re:  boop on prednisone 5 mg/ 2.5 mg daily (didn't follow instructions)  Chief Complaint  Patient presents with  . Follow-up    baseline, SOB with exertion, wheezing   Dyspnea:  Bicycle chair 3 x daily last did 2.5 weeks with 02  95%  on 2lpm on concentrator / stopped doing due to positional back pain, has not been seen for this yet, no radicular features  Cough: no Sleeping: recliner due to back  SABA use: saba 3 x in 75month 02: 2lpm / 3lpm poc out    No obvious day to day or daytime variability or assoc excess/ purulent sputum or mucus plugs or hemoptysis or cp or chest tightness, subjective wheeze or overt sinus or hb symptoms.   Sleeping as above with back issues but  without nocturnal  or early am exacerbation  of respiratory  c/o's or need for noct saba. Also denies any obvious fluctuation of symptoms with weather or environmental changes or other aggravating or alleviating factors except as outlined above   No unusual exposure hx or h/o childhood pna/ asthma or knowledge of premature birth.  Current Allergies, Complete Past Medical History, Past Surgical History, Family History, and Social History were reviewed in [employer].  ROS  The following are not active complaints unless bolded Hoarseness, sore throat, dysphagia, dental  problems, itching, sneezing,  nasal congestion or discharge of excess mucus or purulent secretions, ear ache,   fever, chills, sweats, unintended wt loss or wt gain, classically pleuritic or exertional cp,  orthopnea pnd or arm/hand swelling  or leg swelling, presyncope, palpitations, abdominal pain, anorexia, nausea, vomiting, diarrhea  or change in bowel habits or change in bladder habits, change in stools or change in urine, dysuria, hematuria,  rash, arthralgias, visual complaints, headache, numbness, weakness or ataxia or problems with walking or coordination,  change in mood or  memory.        Current Meds  Medication Sig  . acetaminophen (TYLENOL) 325 MG tablet Take 650 mg by mouth every 6 (six) hours as needed for mild pain. Take as needed per bottle   . albuterol (PROAIR HFA) 108 (90 Base) MCG/ACT inhaler INHALE 2 PUFFS BY MOUTH EVERY 4 HOURS AS NEEDED FOR WHEEZING OR FOR SHORTNESS OF BREATH  . aspirin 81 MG tablet Take 81 mg by mouth daily.  . bisoprolol (ZEBETA) 5 MG tablet Take 1.5 tablets (7.5 mg total) by mouth daily.  . chlorpheniramine (CHLOR-TRIMETON) 4 MG tablet Take 4 mg by mouth 2 (two) times daily as needed for allergies.  .Marland Kitchenclopidogrel (PLAVIX) 75 MG tablet Take 1 tablet (75 mg total) by mouth daily.  . cyanocobalamin (,VITAMIN B-12,) 1000 MCG/ML injection Inject 1,000 mcg into the muscle every 3 (three) months.  . EPINEPHrine (EPIPEN 2-PAK) 0.3 mg/0.3 mL IJ  SOAJ injection INJECT 0.3 MLS (0.3 MG TOTAL) INTO THE MUSCLE ONCE AS NEEDED FOR ALLERGIC REACTION  . esomeprazole (NEXIUM) 40 MG capsule Take 1 capsule (40 mg total) by mouth daily.  . Evolocumab (REPATHA SURECLICK) 196 MG/ML SOAJ Inject 1 Dose into the skin every 14 (fourteen) days.  Marland Kitchen ezetimibe (ZETIA) 10 MG tablet TAKE 1 TABLET (10 MG TOTAL) BY MOUTH DAILY.  . famotidine (PEPCID) 20 MG tablet Take 40 mg by mouth daily.  . furosemide (LASIX) 20 MG tablet Take 1 tablet (20 mg total) by mouth daily.  . insulin glargine  (LANTUS) 100 UNIT/ML injection Inject 0.22 mLs (22 Units total) into the skin daily.  . OXYGEN 24/7 2 lpm  [employer]  . predniSONE (DELTASONE) 5 MG tablet TAKE 1 TABLET BY MOUTH EVERY DAY WITH BREAKFAST (Patient taking differently: No sig reported)  . sacubitril-valsartan (ENTRESTO) 97-103 MG Take 1 tablet by mouth 2 (two) times daily.  Marland Kitchen spironolactone (ALDACTONE) 25 MG tablet Take 0.5 tablets (12.5 mg total) by mouth daily.  Marland Kitchen triamcinolone cream (KENALOG) 0.1 % Apply 1 application topically daily. To affected areas of ears                  Objective:   Physical Exam 01/31/2013   237  > 07/03/2013  232 > 07/24/2013  231 > 09/06/2013  233 > 04/18/2014 256  10/18/2013  233 > 11/29/2013 238 >  03/07/2014  249 > 05/17/2014  263 > 06/26/2014 261>   10/01/2014  247 > 01/02/2015  253 >  04/16/2015 247 >  04/30/2015  244 > 06/14/2015   246 > 09/23/2015   255 > 11/04/2015 253  >  05/07/2016 259 > 06/23/2016   265  > 09/28/2016   247 > 11/20/2016  255 > 08/16/2017  231 > 11/15/2017    232 > 02/14/2018  234 > 05/17/2018   243 > 08/18/2018   243   Obese wf in w/c due to back   Vital signs reviewed - Note on arrival 02 sats  97% on   3lpm POC    HEENT: nl dentition, turbinates bilaterally, and oropharynx. Nl external ear canals without cough reflex   NECK :  without JVD/Nodes/TM/ nl carotid upstrokes bilaterally   LUNGS: no acc muscle use,  Nl contour chest  With scant/minimal insp crackles bases  bilaterally without cough on insp or exp maneuvers   CV:  RRR  no s3 or murmur or increase in P2, and no edema   ABD:  soft and nontender with nl inspiratory excursion in the supine position. No bruits or organomegaly appreciated, bowel sounds nl  MS:    ext warm without deformities, calf tenderness, cyanosis or clubbing No obvious joint restrictions   SKIN: warm and dry without lesions    NEURO:  alert, approp, nl sensorium with  no motor or cerebellar deficits apparent.

## 2018-08-21 ENCOUNTER — Encounter: Payer: Self-pay | Admitting: Internal Medicine

## 2018-08-23 ENCOUNTER — Ambulatory Visit (INDEPENDENT_AMBULATORY_CARE_PROVIDER_SITE_OTHER)
Admission: RE | Admit: 2018-08-23 | Discharge: 2018-08-23 | Disposition: A | Discharge status: Home / Self Care | Payer: Medicare Other | Source: Ambulatory Visit | Attending: Family Medicine | Admitting: Family Medicine

## 2018-08-23 ENCOUNTER — Encounter: Payer: Self-pay | Admitting: Family Medicine

## 2018-08-23 ENCOUNTER — Ambulatory Visit (INDEPENDENT_AMBULATORY_CARE_PROVIDER_SITE_OTHER): Payer: Medicare Other | Admitting: Family Medicine

## 2018-08-23 VITALS — BP 124/68 | HR 65 | Temp 97.9°F | Ht 64.5 in

## 2018-08-23 DIAGNOSIS — E1169 Type 2 diabetes mellitus with other specified complication: Secondary | ICD-10-CM

## 2018-08-23 DIAGNOSIS — E669 Obesity, unspecified: Secondary | ICD-10-CM

## 2018-08-23 DIAGNOSIS — L309 Dermatitis, unspecified: Secondary | ICD-10-CM

## 2018-08-23 DIAGNOSIS — M545 Low back pain, unspecified: Secondary | ICD-10-CM | POA: Insufficient documentation

## 2018-08-23 DIAGNOSIS — I82621 Acute embolism and thrombosis of deep veins of right upper extremity: Secondary | ICD-10-CM

## 2018-08-23 DIAGNOSIS — E538 Deficiency of other specified B group vitamins: Secondary | ICD-10-CM

## 2018-08-23 DIAGNOSIS — I5022 Chronic systolic (congestive) heart failure: Secondary | ICD-10-CM | POA: Diagnosis not present

## 2018-08-23 DIAGNOSIS — J8489 Other specified interstitial pulmonary diseases: Secondary | ICD-10-CM

## 2018-08-23 MED ORDER — CYANOCOBALAMIN 1000 MCG/ML IJ SOLN
1000.0000 ug | Freq: Once | INTRAMUSCULAR | Status: AC
  Administered 2018-08-23: 1000 ug via INTRAMUSCULAR

## 2018-08-23 NOTE — Patient Instructions (Addendum)
Start using a scent free moisturizer on chest and abdomen  This should help dryness and rash and itching Avoid scented soaps (dove is best)   Cort aid over the counter can also help itchy spots   B12 shot today   Xray of lumbar spine  We may consider some physical therapy  We will let you know when the reading returns   Use heat or icy hot but not both at the same  Don't sleep on a heating pad  Acetaminophen is ok

## 2018-08-23 NOTE — Assessment & Plan Note (Signed)
Lab Results  Component Value Date   HGBA1C 5.4 07/06/2018   Continues to wean prednisone

## 2018-08-23 NOTE — Assessment & Plan Note (Signed)
Lab Results  Component Value Date   [id number] 002 07/06/2018

## 2018-08-23 NOTE — Assessment & Plan Note (Signed)
Per pt this continues to be resolved  Sees pulmonary and continues to be 02 dep Slowly weaning prednisone

## 2018-08-23 NOTE — Assessment & Plan Note (Signed)
In ears in past Now on chest and abdomen  Recommend use of regular scent free moisturizer Stop body wash and change to dove soap at least for the winter  Cortisone cream otc if needed  Alert [place] if no improvement

## 2018-08-23 NOTE — Assessment & Plan Note (Signed)
This seems muscular on exam  More on R today  Limited rom  No spinal tenderness Xray today in light of length of symptoms Acetaminophen prn  Heat prn and stretching  Plan to follow-may consider PT

## 2018-08-23 NOTE — Progress Notes (Signed)
Subjective:    Patient ID: Kelli Velasquez, female    DOB: [birth year], 78 y.o.   MRN: [id number]  HPI Here for low back pain for over a month Also a rash on chest  Also B12 shot   Low back pain  Thinks it is muscle problem on either side - worse on the right side (to start it was the left)  Used icy hot  Also tried CBD oil -helps a little Thinks it came from a twisting before it started   Bending/ext does not bother her  Better with sitting  Standing is worse Walking is "awful" -- making it more difficult and makes her weaker  Also cannot sleep  Cannot get a comfortable - she tends to sleep on side  Has had to sleep in a recliner   Pain does not shoot down either leg   Has had a rash on her chest  ? Allergic  Very itchy  Uses topical benadryl     otc: has used tylenol ([facility] brand)  In the car -uses heated seat that helped  Has not tried ice   Has never had a back problem before   Also due for B12 shot    Prednisone status - down to 2.5 mg every day  Breathing has been ok (stable)  Still 02 dependent   Wt Readings from Last 3 Encounters:  08/18/18 243 lb 3.2 oz (110.3 kg)  07/12/18 246 lb (111.6 kg)  07/08/18 241 lb 8 oz (109.5 kg)   41.10 kg/m   Patient Active Problem List   Diagnosis Date Noted  . Low back pain 08/23/2018  . Eczema 07/08/2018  . Hoarseness of voice 01/03/2018  . Coronary artery disease 07/07/2017  . Chronic systolic heart failure ([place]) 07/07/2017  . Abnormal urinalysis 07/07/2017  . History of DVT (deep vein thrombosis) 07/07/2017  . Diabetes mellitus type 2 in obese ([place]) 07/07/2017  . BOOP (bronchiolitis obliterans with organizing pneumonia) ([place])   . Hypoxemia   . Acute on chronic systolic congestive heart failure ([place])   . Encounter for screening mammogram for breast cancer 02/02/2017  . Chronic combined systolic and diastolic CHF, NYHA class 2 ([place]) 02/02/2017  . Depressed mood 12/02/2016  . Routine general medical  examination at a health care facility 12/30/2015  . Supraumbilical hernia 41/96/2229  . Lump in the abdomen 04/10/2015  . GERD (gastroesophageal reflux disease) 10/10/2014  . B12 deficiency 10/09/2014  . Cataracts, bilateral 10/09/2014  . Dry eyes 10/09/2014  . Fatigue 05/08/2014  . Pedal edema 05/08/2014  . Osteopenia 11/28/2013  . Estrogen deficiency 11/02/2013  . Dyspnea on exertion 06/27/2013  . Hernia, incisional 04/03/2013  . 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 Cough 12/18/2012  . Chronic respiratory failure with hypoxia and hypercapnia ([place]) 10/26/2012  . Special screening for malignant neoplasms, colon 06/16/2011  . CAD, NATIVE VESSEL 08/19/2009  . 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 Morbid obesity due to excess calories ([place]) comp by hbp/ hyperlipidemia/ dm/ihd 09/01/2007  . MYOPIA 03/03/2007  . Hyperlipidemia 10/15/2006  . Essential hypertension 10/15/2006  . Allergic rhinitis 10/15/2006  . Cough variant asthma vs UACS  10/15/2006  . OVERACTIVE BLADDER 10/15/2006  . OSTEOARTHRITIS 10/15/2006  . Urinary incontinence 10/15/2006   Past Medical History:  Diagnosis Date  . Allergy    allergic rhinitis  . Asthma   . Chronic bronchitis ([place])   . Coronary artery disease    cath January 2011 with DEs LAD  and RCA  . Dyspnea   . Full dentures   . Gallstones   . GERD (gastroesophageal reflux disease)   . History of echocardiogram    Echo 5/17: mod LVH, EF 50-55%, ant-septal HK, Gr 1 DD, mod LAE //  b.  Echo 7/17: mild concentric LVH, EF 45-50%, inf-lat, inf, inf-septal HK, mild LAE  . History of nuclear stress test    Myoview 7/17: EF 48%, small mild apical defect, no ischemia, low risk  . Hyperlipidemia   . Hypertension   . Myocardial infarction ([place])    subendocardial, initial episode, 2010 two stents placed  . Myopia   . 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 Nocturnal oxygen desaturation    o2  at night  . Obesity   . Osteoarthritis    knees, fingers, shoulders  . Overactive bladder   . Oxygen deficiency    uses oxygen all day  . Rash    and other non specific skin eruptions  . Retaining fluid    in ankles and feet  . Urinary incontinence   . Vertigo   . Wears glasses    Past Surgical History:  Procedure Laterality Date  . CATARACT EXTRACTION W/PHACO Right 12/16/2016   Procedure: CATARACT EXTRACTION PHACO AND INTRAOCULAR LENS PLACEMENT ([place])  right;  Surgeon: Leandrew Koyanagi, MD;  Location: [facility];  Service: Ophthalmology;  Laterality: Right;  . CATARACT EXTRACTION W/PHACO Left 02/03/2017   Procedure: CATARACT EXTRACTION PHACO AND INTRAOCULAR LENS PLACEMENT ([place])  Left  Complicated;  Surgeon: Leandrew Koyanagi, MD;  Location: [facility];  Service: Ophthalmology;  Laterality: Left;  [place] Uses oxygen   . CHOLECYSTECTOMY  92  . COLONOSCOPY    . CORONARY ANGIOGRAPHY N/A 06/16/2017   Procedure: CORONARY ANGIOGRAPHY;  Surgeon: Larey Dresser, MD;  Location: [facility];  Service: Cardiovascular;  Laterality: N/A;  . CORONARY ANGIOPLASTY WITH STENT PLACEMENT  07/2009   stent  . CORONARY STENT INTERVENTION N/A 06/16/2017   Procedure: CORONARY STENT INTERVENTION;  Surgeon: Wellington Hampshire, MD;  Location: [facility];  Service: Cardiovascular;  Laterality: N/A;  . DILATION AND CURETTAGE OF UTERUS  1984  . INCISIONAL HERNIA REPAIR N/A 05/16/2013   Procedure: HERNIA REPAIR INFRAUMILICAL INCISIONAL;  Surgeon: Joyice Faster. Cornett, MD;  Location: [facility];  Service: General;  Laterality: N/A;  umbilical  . INSERTION OF MESH N/A 05/16/2013   Procedure: INSERTION OF MESH;  Surgeon: Joyice Faster. Cornett, MD;  Location: [facility];  Service: General;  Laterality: N/A;  umbilical  . JOINT REPLACEMENT  2007   right total knee replacement  . RIGHT/LEFT HEART CATH AND CORONARY ANGIOGRAPHY N/A 06/16/2017    Procedure: RIGHT/LEFT HEART CATH AND CORONARY ANGIOGRAPHY;  Surgeon: Larey Dresser, MD;  Location: [facility];  Service: Cardiovascular;  Laterality: N/A;  . TOTAL KNEE ARTHROPLASTY  2010   left , then vocal cord infection post op  . VIDEO BRONCHOSCOPY Bilateral 07/05/2013   Procedure: VIDEO BRONCHOSCOPY WITH FLUORO;  Surgeon: Tanda Rockers, MD;  Location: [facility];  Service: Cardiopulmonary;  Laterality: Bilateral;  . vocal cord polypectomy     Social History   Tobacco Use  . Smoking status: Former Smoker    Packs/day: 1.00    Years: 25.00    Pack years: 25.00    Types: Cigarettes    Last attempt to quit: 07/27/1982    Years since quitting: 36.0  .  Smokeless tobacco: Never Used  Substance Use Topics  . Alcohol use: Yes    Alcohol/week: 0.0 standard drinks    Comment: wine-rare  . Drug use: No   Family History  Problem Relation Age of Onset  . Stroke Mother   . Diabetes Mother 76  . Stroke Father   . Heart failure Father   . Breast cancer Maternal Grandmother   . Colon cancer Neg Hx    Allergies  Allergen Reactions  . Fish Allergy Anaphylaxis  . Shellfish Allergy Anaphylaxis  . Aspirin     REACTION: nausea and vomiting High doses  . Atorvastatin     REACTION: leg pain  . Crestor [Rosuvastatin Calcium] Other (See Comments)    Muscle pain - allergy/intolerance  . Penicillins     Due to mold allergy per pt  . Simvastatin     REACTION: muscle pain  . Trandolapril     REACTION: leg pain   Current Outpatient Medications on File Prior to Visit  Medication Sig Dispense Refill  . acetaminophen (TYLENOL) 325 MG tablet Take 650 mg by mouth every 6 (six) hours as needed for mild pain. Take as needed per bottle     . albuterol (PROAIR HFA) 108 (90 Base) MCG/ACT inhaler INHALE 2 PUFFS BY MOUTH EVERY 4 HOURS AS NEEDED FOR WHEEZING OR FOR SHORTNESS OF BREATH 8 Inhaler 5  . aspirin 81 MG tablet Take 81 mg by mouth daily.    . bisoprolol (ZEBETA) 5 MG tablet Take  1.5 tablets (7.5 mg total) by mouth daily. 45 tablet 6  . chlorpheniramine (CHLOR-TRIMETON) 4 MG tablet Take 4 mg by mouth 2 (two) times daily as needed for allergies.    Marland Kitchen clopidogrel (PLAVIX) 75 MG tablet Take 1 tablet (75 mg total) by mouth daily. 90 tablet 3  . cyanocobalamin (,VITAMIN B-12,) 1000 MCG/ML injection Inject 1,000 mcg into the muscle every 3 (three) months.    . EPINEPHrine (EPIPEN 2-PAK) 0.3 mg/0.3 mL IJ SOAJ injection INJECT 0.3 MLS (0.3 MG TOTAL) INTO THE MUSCLE ONCE AS NEEDED FOR ALLERGIC REACTION 2 Device 1  . esomeprazole (NEXIUM) 40 MG capsule Take 1 capsule (40 mg total) by mouth daily. 90 capsule 3  . Evolocumab (REPATHA SURECLICK) 130 MG/ML SOAJ Inject 1 Dose into the skin every 14 (fourteen) days. 2 pen 11  . ezetimibe (ZETIA) 10 MG tablet TAKE 1 TABLET (10 MG TOTAL) BY MOUTH DAILY. 90 tablet 3  . famotidine (PEPCID) 20 MG tablet Take 40 mg by mouth daily.    . furosemide (LASIX) 20 MG tablet Take 1 tablet (20 mg total) by mouth daily. 90 tablet 3  . insulin glargine (LANTUS) 100 UNIT/ML injection Inject 0.22 mLs (22 Units total) into the skin daily. 10 mL 5  . OXYGEN 24/7 2 lpm  [employer]    . predniSONE (DELTASONE) 5 MG tablet Take 2.5 mg by mouth daily.    . sacubitril-valsartan (ENTRESTO) 97-103 MG Take 1 tablet by mouth 2 (two) times daily. 60 tablet 6  . spironolactone (ALDACTONE) 25 MG tablet Take 0.5 tablets (12.5 mg total) by mouth daily. 15 tablet 5  . triamcinolone cream (KENALOG) 0.1 % Apply 1 application topically daily. To affected areas of ears 30 g 1   No current facility-administered medications on file prior to visit.     Review of Systems  Constitutional: Negative for activity change, appetite change, fatigue, fever and unexpected weight change.  HENT: Negative for congestion, ear pain, rhinorrhea, sinus pressure and sore  throat.   Eyes: Negative for pain, redness and visual disturbance.  Respiratory: Positive for shortness of breath. Negative for  cough and wheezing.        Baseline sob on exertion   Cardiovascular: Negative for chest pain and palpitations.  Gastrointestinal: Negative for abdominal pain, blood in stool, constipation and diarrhea.  Endocrine: Negative for polydipsia and polyuria.  Genitourinary: Negative for dysuria, frequency and urgency.  Musculoskeletal: Positive for back pain and gait problem. Negative for arthralgias, joint swelling and myalgias.  Skin: Positive for rash. Negative for pallor.  Allergic/Immunologic: Negative for environmental allergies.  Neurological: Negative for dizziness, syncope and headaches.  Hematological: Negative for adenopathy. Does not bruise/bleed easily.  Psychiatric/Behavioral: Negative for decreased concentration and dysphoric mood. The patient is not nervous/anxious.        Objective:   Physical Exam Constitutional:      General: She is not in acute distress.    Appearance: Normal appearance. She is well-developed. She is obese. She is not ill-appearing.  HENT:     Head: Normocephalic and atraumatic.     Mouth/Throat:     Mouth: Mucous membranes are moist.     Pharynx: Oropharynx is clear.  Eyes:     General: No scleral icterus.    Conjunctiva/sclera: Conjunctivae normal.     Pupils: Pupils are equal, round, and reactive to light.  Neck:     Musculoskeletal: Normal range of motion and neck supple.  Cardiovascular:     Rate and Rhythm: Normal rate and regular rhythm.     Pulses: Normal pulses.     Heart sounds: Normal heart sounds.  Pulmonary:     Effort: Pulmonary effort is normal.     Breath sounds: Normal breath sounds. No wheezing or rales.     Comments: Diffusely distant bs  Using 02 per [place] Abdominal:     General: Bowel sounds are normal. There is no distension.     Palpations: Abdomen is soft.     Tenderness: There is no abdominal tenderness.  Musculoskeletal:        General: Tenderness present.     Right shoulder: She exhibits decreased range of motion,  tenderness and spasm. She exhibits no bony tenderness, no swelling, no crepitus, no deformity, normal pulse and normal strength.     Lumbar back: She exhibits decreased range of motion, tenderness and spasm. She exhibits no bony tenderness and no edema.     Right lower leg: No edema.     Left lower leg: No edema.     Comments: Tender lumbar musculature worse on R  Neg SLR  Nl rom hips  LS flex 45 deg/ ext 10 deg  No pain on lat bend  Nl gait  No neuro changes   Lymphadenopathy:     Cervical: No cervical adenopathy.  Skin:    General: Skin is warm and dry.     Coloration: Skin is not pale.     Findings: Rash present. No erythema.     Comments: Dry skin with patches of erythema on chest and back  No breakdown slt scale Resembles eczema   Neurological:     Mental Status: She is alert. Mental status is at baseline.     Cranial Nerves: No cranial nerve deficit.     Sensory: No sensory deficit.     Motor: No atrophy or abnormal muscle tone.     Coordination: Coordination normal.     Deep Tendon Reflexes: Reflexes are normal and symmetric.  Comments: Negative SLR  Psychiatric:        Mood and Affect: Mood normal.           Assessment & Plan:   Problem List Items Addressed This Visit      Cardiovascular and Mediastinum   Chronic systolic heart failure ([place])    Doing well w/o clinical change      RESOLVED: Deep vein thrombosis (DVT) of brachial vein of right upper extremity (H[facility])     Respiratory   BOOP (bronchiolitis obliterans with organizing pneumonia) ([place])    Per pt this continues to be resolved  Sees pulmonary and continues to be 02 dep Slowly weaning prednisone        Endocrine   Diabetes mellitus type 2 in obese [facility])    Lab Results  Component Value Date   HGBA1C 5.4 07/06/2018   Continues to wean prednisone        Musculoskeletal and Integument   Eczema    In ears in past Now on chest and abdomen  Recommend use of regular scent free  moisturizer Stop body wash and change to dove soap at least for the winter  Cortisone cream otc if needed  Alert [place] if no improvement         Other   B12 deficiency    Lab Results  Component Value Date   VITAMINB12 514 07/06/2018         Relevant Medications   cyanocobalamin ((VITAMIN B-12)) injection 1,000 mcg (Completed)   Low back pain - Primary    This seems muscular on exam  More on R today  Limited rom  No spinal tenderness Xray today in light of length of symptoms Acetaminophen prn  Heat prn and stretching  Plan to follow-may consider PT       Relevant Medications   predniSONE (DELTASONE) 5 MG tablet   Other Relevant Orders   DG Lumbar Spine Complete (Completed)

## 2018-08-23 NOTE — Assessment & Plan Note (Signed)
Doing well w/o clinical change

## 2018-08-24 ENCOUNTER — Ambulatory Visit: Payer: Self-pay

## 2018-08-24 ENCOUNTER — Telehealth: Payer: Self-pay | Admitting: Family Medicine

## 2018-08-24 DIAGNOSIS — M545 Low back pain, unspecified: Secondary | ICD-10-CM

## 2018-08-24 NOTE — Telephone Encounter (Signed)
Referral is done  I sent a flag to her cardiologist about a muscle relaxer

## 2018-08-24 NOTE — Telephone Encounter (Signed)
-----   Message from Tammi Sou, [place] sent at 08/24/2018 12:40 PM EST ----- Pt viewed results on mychart. Pt agrees with PT referral and I advise her that [facility] will call and schedule appt.  Pt said she does want to try a muscle relaxer as long as her cardiologist thinks it's okay. Pt said when she last saw him he adjusted her meds and told her not to take anything new without checking with him 1st. Pt wants to see if DR. Kydan Shanholtzer could check with cardiologist 1st and as long as he is okay with a muscle relaxer she would like to try it to see if will help her sxs  [facility] [address]

## 2018-08-24 NOTE — Telephone Encounter (Signed)
Appt scheduled and patient is aware.

## 2018-08-25 ENCOUNTER — Telehealth: Payer: Self-pay | Admitting: Family Medicine

## 2018-08-25 MED ORDER — METHOCARBAMOL 500 MG PO TABS: 500.0000 mg | ORAL_TABLET | Freq: Three times a day (TID) | ORAL | 1 refills | Status: DC | PRN

## 2018-08-25 NOTE — Telephone Encounter (Signed)
Please let her know I sent px for generic robaxin (muscle relaxer) [facility]  Caution of sedation  Hope it helps I did check in with cardiology

## 2018-08-26 NOTE — Telephone Encounter (Signed)
Patient advised.

## 2018-08-30 ENCOUNTER — Other Ambulatory Visit: Payer: Self-pay | Admitting: Family Medicine

## 2018-08-31 DIAGNOSIS — M545 Low back pain: Secondary | ICD-10-CM | POA: Diagnosis not present

## 2018-09-14 DIAGNOSIS — M545 Low back pain: Secondary | ICD-10-CM | POA: Diagnosis not present

## 2018-09-15 DIAGNOSIS — R0902 Hypoxemia: Secondary | ICD-10-CM | POA: Diagnosis not present

## 2018-09-21 DIAGNOSIS — M545 Low back pain: Secondary | ICD-10-CM | POA: Diagnosis not present

## 2018-09-29 ENCOUNTER — Other Ambulatory Visit: Payer: Self-pay | Admitting: Family Medicine

## 2018-09-30 ENCOUNTER — Other Ambulatory Visit: Payer: Self-pay | Admitting: Family Medicine

## 2018-09-30 ENCOUNTER — Ambulatory Visit (HOSPITAL_COMMUNITY)
Admission: RE | Admit: 2018-09-30 | Discharge: 2018-09-30 | Disposition: A | Discharge status: Home / Self Care | Payer: Medicare Other | Source: Ambulatory Visit | Attending: Cardiology | Admitting: Cardiology

## 2018-09-30 ENCOUNTER — Ambulatory Visit (HOSPITAL_BASED_OUTPATIENT_CLINIC_OR_DEPARTMENT_OTHER)
Admission: RE | Admit: 2018-09-30 | Discharge: 2018-09-30 | Disposition: A | Discharge status: Home / Self Care | Payer: Medicare Other | Source: Ambulatory Visit | Attending: Cardiology | Admitting: Cardiology

## 2018-09-30 VITALS — BP 148/62 | HR 70 | Wt 246.0 lb

## 2018-09-30 DIAGNOSIS — I5042 Chronic combined systolic (congestive) and diastolic (congestive) heart failure: Secondary | ICD-10-CM

## 2018-09-30 DIAGNOSIS — N179 Acute kidney failure, unspecified: Secondary | ICD-10-CM | POA: Insufficient documentation

## 2018-09-30 DIAGNOSIS — Z7952 Long term (current) use of systemic steroids: Secondary | ICD-10-CM | POA: Diagnosis not present

## 2018-09-30 DIAGNOSIS — I252 Old myocardial infarction: Secondary | ICD-10-CM | POA: Insufficient documentation

## 2018-09-30 DIAGNOSIS — Z7902 Long term (current) use of antithrombotics/antiplatelets: Secondary | ICD-10-CM | POA: Insufficient documentation

## 2018-09-30 DIAGNOSIS — Z955 Presence of coronary angioplasty implant and graft: Secondary | ICD-10-CM | POA: Insufficient documentation

## 2018-09-30 DIAGNOSIS — Z9981 Dependence on supplemental oxygen: Secondary | ICD-10-CM | POA: Diagnosis not present

## 2018-09-30 DIAGNOSIS — Z87891 Personal history of nicotine dependence: Secondary | ICD-10-CM | POA: Diagnosis not present

## 2018-09-30 DIAGNOSIS — Z79899 Other long term (current) drug therapy: Secondary | ICD-10-CM | POA: Insufficient documentation

## 2018-09-30 DIAGNOSIS — I5022 Chronic systolic (congestive) heart failure: Secondary | ICD-10-CM | POA: Insufficient documentation

## 2018-09-30 DIAGNOSIS — I110 Hypertensive heart disease with heart failure: Secondary | ICD-10-CM | POA: Insufficient documentation

## 2018-09-30 DIAGNOSIS — E119 Type 2 diabetes mellitus without complications: Secondary | ICD-10-CM | POA: Diagnosis not present

## 2018-09-30 DIAGNOSIS — I255 Ischemic cardiomyopathy: Secondary | ICD-10-CM | POA: Insufficient documentation

## 2018-09-30 DIAGNOSIS — M545 Low back pain: Secondary | ICD-10-CM | POA: Diagnosis not present

## 2018-09-30 DIAGNOSIS — J9611 Chronic respiratory failure with hypoxia: Secondary | ICD-10-CM | POA: Diagnosis not present

## 2018-09-30 DIAGNOSIS — R0602 Shortness of breath: Secondary | ICD-10-CM | POA: Insufficient documentation

## 2018-09-30 DIAGNOSIS — I5023 Acute on chronic systolic (congestive) heart failure: Secondary | ICD-10-CM

## 2018-09-30 DIAGNOSIS — J9612 Chronic respiratory failure with hypercapnia: Secondary | ICD-10-CM

## 2018-09-30 DIAGNOSIS — Z8249 Family history of ischemic heart disease and other diseases of the circulatory system: Secondary | ICD-10-CM | POA: Diagnosis not present

## 2018-09-30 DIAGNOSIS — I2510 Atherosclerotic heart disease of native coronary artery without angina pectoris: Secondary | ICD-10-CM | POA: Insufficient documentation

## 2018-09-30 DIAGNOSIS — Z7982 Long term (current) use of aspirin: Secondary | ICD-10-CM | POA: Diagnosis not present

## 2018-09-30 DIAGNOSIS — I219 Acute myocardial infarction, unspecified: Secondary | ICD-10-CM | POA: Insufficient documentation

## 2018-09-30 DIAGNOSIS — K219 Gastro-esophageal reflux disease without esophagitis: Secondary | ICD-10-CM | POA: Insufficient documentation

## 2018-09-30 DIAGNOSIS — Z794 Long term (current) use of insulin: Secondary | ICD-10-CM | POA: Diagnosis not present

## 2018-09-30 DIAGNOSIS — E785 Hyperlipidemia, unspecified: Secondary | ICD-10-CM | POA: Insufficient documentation

## 2018-09-30 LAB — BASIC METABOLIC PANEL
Anion gap: 7 (ref 5–15)
BUN: 30 mg/dL — ABNORMAL HIGH (ref 8–23)
CO2: 27 mmol/L (ref 22–32)
Calcium: 9 mg/dL (ref 8.9–10.3)
Chloride: 106 mmol/L (ref 98–111)
Creatinine, Ser: 1.26 mg/dL — ABNORMAL HIGH (ref 0.44–1.00)
GFR calc Af Amer: 47 mL/min — ABNORMAL LOW (ref >60)
GFR calc non Af Amer: 41 mL/min — ABNORMAL LOW (ref >60)
GLUCOSE: 100 mg/dL — AB (ref 70–99)
Potassium: 5.5 mmol/L — ABNORMAL HIGH (ref 3.5–5.1)
Sodium: 140 mmol/L (ref 135–145)

## 2018-09-30 LAB — LIPID PANEL
Cholesterol: 114 mg/dL (ref 0–200)
HDL: 47 mg/dL (ref >40)
LDL CALC: 27 mg/dL (ref 0–99)
Total CHOL/HDL Ratio: 2.4 RATIO
Triglycerides: 201 mg/dL — ABNORMAL HIGH (ref <150)
VLDL: 40 mg/dL (ref 0–40)

## 2018-09-30 NOTE — Telephone Encounter (Signed)
Last filled on 08/25/18 #30 tabs with 1 refills, pt was seen on 08/23/18 for an acute appt, please advise

## 2018-09-30 NOTE — Patient Instructions (Signed)
Labs drawn today. If they come back abnormal, we will call you. Otherwise, no news is good news.  A referral to pulmonology rehab has been placed. They will call you to schedule.  You need a 4 month follow up. We will call you with appointment.

## 2018-09-30 NOTE — Progress Notes (Signed)
  Echocardiogram 2D Echocardiogram has been performed.  Madelaine Etienne 09/30/2018, 11:40 AM

## 2018-09-30 NOTE — Telephone Encounter (Signed)
Left VM requesting pt to call the office back 

## 2018-09-30 NOTE — Telephone Encounter (Signed)
I sent in a larger number this time Please ask how her back pain is -is this helping?

## 2018-10-02 NOTE — Progress Notes (Signed)
HF Cardiology: Dr Aundra Dubin  Pulmonologist: Dr Melvyn Novas.  PCP: Abner Greenspan, MD  HPI: Ms. Kelli Velasquez is a 78 y.o. 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   She had a 32 day-long admission (10/31-12/1/18) for acute hypoxic respiratory failure secondary to acute decompensated HF which was a new diagnosis at the time. She required intubation x2 and hospital course was complicated by MSSA pneumonia and RUE DVT.  LHC 06/16/17 with 95% stenosis of mid RCA and s/p DES. She was discharged to SNF on Entresto 49/51, Lasix 40 QD, spironolactone 25 QD, Plavix and Eliquis 5 BID. Weight 219 pounds at time of discharge.    Admitted 07/07/17 from SNF with presyncope and hypotension. HF meds held. Received IV fluids and antibiotics.   Echo was done today and reviewed, EF now 55-60% with basal inferior hypokinesis and normal RV size and systolic function.   She is slowly tapering down on prednisone (for BOOP).  She continues on home oxygen.  She is stably short of breath with moderate activity.  No chest pain. Main complaint today is low back pain.  No orthopnea/PND.  Weight is up 3 lbs.  SBP 120s-140s when she checks at home. Mildly elevated today. She fatigues easily.   ECG (personally reviewed): NSR, old anterior MI.     Labs (1/19): K 5.1, creatinine 1.1 Labs (2/19): K 4.5, creatinine 1.13, LDL 79, HDL 55, hgb 12.3 Labs (8/19): K 4.9, creatinine 1.26 Labs (12/19): K 4.6, creatinine 1.26  PMH: 1. BOOP: Chronic prednisone, home oxygen.  2. Recurrent UTIs 3. HTN 4. LUE DVT 11/18 associated with PICC.  5. CAD:  - 2011 DES to LAD and RCA.  - LHC (11/18) with 95% mid RCA stenosis => DES.  6. Chronic systolic CHF: Ischemic cardiomyopathy.   - Echo (10/18) with EF 35-40%.  - Echo (2/19) with EF 40-45%, mild LVH, normal RV.  - Echo (3/20) with EF 55-60%,  basal inferior hypokinesis, normal RV.  7. GERD 8. Hyperlipidemia  ROS: All systems negative except as listed in HPI, PMH and Problem List.  Social History   Socioeconomic History  . Marital status: Married    Spouse name: Not on file  . Number of children: Not on file  . Years of education: Not on file  . Highest education level: Not on file  Occupational History  . Not on file  Social Needs  . Financial resource strain: Not on file  . Food insecurity:    Worry: Not on file    Inability: Not on file  . Transportation needs:    Medical: Not on file    Non-medical: Not on file  Tobacco Use  . Smoking status: Former Smoker    Packs/day: 1.00    Years: 25.00    Pack years: 25.00    Types: Cigarettes    Last attempt to quit: 07/27/1982    Years since quitting: 36.2  . Smokeless tobacco: Never Used  Substance and Sexual Activity  . Alcohol use: Yes    Alcohol/week: 0.0 standard drinks    Comment: wine-rare  . Drug use: No  . Sexual activity: Not Currently  Lifestyle  . Physical activity:    Days per week: Not on file    Minutes per session: Not on file  . Stress: Not on file  Relationships  . Social connections:    Talks on phone: Not on file    Gets together: Not on file    Attends religious service: Not on file    Active member of club or organization: Not on file    Attends meetings of clubs or organizations: Not on file    Relationship status: Not on file  . Intimate partner violence:    Fear of current or ex partner: Not on file    Emotionally abused: Not on file    Physically abused: Not on file    Forced sexual activity: Not on file  Other Topics Concern  . Not on file  Social History Narrative  . Not on file   Family History  Problem Relation Age of Onset  . Stroke Mother   . Diabetes Mother 20  . Stroke Father   . Heart failure Father   . Breast cancer Maternal Grandmother   . Colon cancer Neg Hx     Current Outpatient Medications  Medication  Sig Dispense Refill  . acetaminophen (TYLENOL) 325 MG tablet Take 650 mg by mouth every 6 (six) hours as needed for mild pain. Take as needed per bottle     . albuterol (PROAIR HFA) 108 (90 Base) MCG/ACT inhaler INHALE 2 PUFFS BY MOUTH EVERY 4 HOURS AS NEEDED FOR WHEEZING OR FOR SHORTNESS OF BREATH 8 Inhaler 5  . aspirin 81 MG tablet Take 81 mg by mouth daily.    . bisoprolol (ZEBETA) 5 MG tablet Take 1.5 tablets (7.5 mg total) by mouth daily. 45 tablet 6  . chlorpheniramine (CHLOR-TRIMETON) 4 MG tablet Take 4 mg by mouth 2 (two) times daily as needed for allergies.    Marland Kitchen clopidogrel (PLAVIX) 75 MG tablet Take 1 tablet (75 mg total) by mouth daily. 90 tablet 3  . cyanocobalamin (,VITAMIN B-12,) 1000 MCG/ML injection Inject 1,000 mcg into the muscle every 3 (three) months.    . EPINEPHrine (EPIPEN 2-PAK) 0.3 mg/0.3 mL IJ SOAJ injection INJECT 0.3 MLS (0.3 MG TOTAL) INTO THE MUSCLE ONCE AS NEEDED FOR ALLERGIC REACTION 2 Device 1  . esomeprazole (NEXIUM) 40 MG capsule Take 1 capsule (40 mg total) by mouth daily. 90 capsule 3  . Evolocumab (REPATHA SURECLICK) 097 MG/ML SOAJ Inject 1 Dose into the skin every 14 (fourteen) days. 2 pen 11  . ezetimibe (ZETIA) 10 MG tablet TAKE 1 TABLET (10 MG TOTAL) BY MOUTH DAILY. 90 tablet 3  . famotidine (PEPCID) 20 MG tablet Take 40 mg by mouth daily.    . furosemide (LASIX) 20 MG tablet Take 1 tablet (20 mg total) by mouth daily. 90 tablet 3  . insulin glargine (LANTUS) 100 UNIT/ML injection Inject 0.22 mLs (22 Units total) into the skin daily. 10 mL 5  . OXYGEN 24/7 2 lpm  [employer]    . predniSONE (DELTASONE) 5 MG tablet Take 2.5 mg by mouth daily.    . sacubitril-valsartan (ENTRESTO) 97-103 MG Take 1 tablet by mouth 2 (two) times daily. 60 tablet 6  . triamcinolone cream (KENALOG) 0.1 % Apply 1 application topically daily. To affected areas of ears 30 g 1  . methocarbamol (ROBAXIN) 500 MG tablet Take 1 tablet (500 mg total) by mouth every 8 (eight) hours as needed  for muscle spasms. 90 tablet 0  . spironolactone (ALDACTONE) 25 MG tablet TAKE 1/2 TAB BY MOUTH DAILY 45 tablet 1   No current facility-administered medications for this encounter.     Vitals:   09/30/18  1144  BP: (!) 148/62  Pulse: 70  SpO2: 97%  Weight: 111.6 kg (246 lb)    PHYSICAL EXAM: General: NAD Neck: Thick, no JVD, no thyromegaly or thyroid nodule.  Lungs: Clear to auscultation bilaterally with normal respiratory effort. CV: Nondisplaced PMI.  Heart regular S1/S2, no S3/S4, no murmur.  No peripheral edema.  No carotid bruit.  Normal pedal pulses.  Abdomen: Soft, nontender, no hepatosplenomegaly, no distention.  Skin: Intact without lesions or rashes.  Neurologic: Alert and oriented x 3.  Psych: Normal affect. Extremities: No clubbing or cyanosis.  HEENT: Normal.   ASSESSMENT & PLAN: 1. Chronic systolic CHF: Ischemic cardiomyopathy.  Echo was done today and reviewed, EF up to 55-60%.  NYHA class III symptoms, chronic.  She is not volume overloaded.  Much of residual dyspnea is likely due to chronic BOOP deconditioning.   - Continue spironolactone 12.5 daily.   - Continue Entresto 97/103 bid.  BMET today. - Continue bisoprolol 7.5 mg daily.   - Continue Lasix 20 mg daily.  - She is currently out of ICD range.  - Repeat echo at followup in 3 months.  2. CAD: Most recently had DES to [facility] in 11/18.  No chest pain.   - Continue ASA 81 and Plavix 75.   - She cannot tolerate statins, now on Repatha and Zetia.  Check lipids today.      3. H/O AKI: Check BMET today. 4. 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  5. RUE DVT: In 11/18, related to PICC.  Now off Eliquis. 6. Deconditioning: I will refer her to pulmonary rehab.   Followup in 4 months.   Loralie Champagne 10/02/2018

## 2018-10-03 ENCOUNTER — Telehealth (HOSPITAL_COMMUNITY): Payer: Self-pay

## 2018-10-03 NOTE — Telephone Encounter (Signed)
Relayed message to pt, scheduled repeat BMET 3/10 @ 10:15am

## 2018-10-03 NOTE — Telephone Encounter (Signed)
-----   Message from Larey Dresser, MD sent at 10/02/2018 10:10 AM EDT ----- Make sure she is not taking any K supplement and repeat BMET on Monday to make sure not hemolysis.

## 2018-10-04 ENCOUNTER — Other Ambulatory Visit (HOSPITAL_COMMUNITY): Payer: Self-pay

## 2018-10-05 ENCOUNTER — Telehealth (HOSPITAL_COMMUNITY): Payer: Self-pay

## 2018-10-05 NOTE — Telephone Encounter (Signed)
Called pt for verification of location preference for pulmonary rehab. Pt states she would like to be go to [facility]. Informed pt referral would be forwarded to [facility] and they would be in contact for scheduling. Referral forwarded and [facility] rehab called and made aware.

## 2018-10-06 ENCOUNTER — Ambulatory Visit: Payer: Self-pay | Admitting: Nurse Practitioner

## 2018-10-07 ENCOUNTER — Telehealth: Payer: Self-pay | Admitting: *Deleted

## 2018-10-07 NOTE — Telephone Encounter (Signed)
Message received via mychart, canceled as requested.   Cancel my appointment for Monday October 10, 2018 ay 10:45 am I will reschedule when it's safe

## 2018-10-10 ENCOUNTER — Ambulatory Visit: Payer: Medicare Other | Admitting: Internal Medicine

## 2018-10-11 ENCOUNTER — Other Ambulatory Visit (HOSPITAL_COMMUNITY): Payer: Self-pay | Admitting: Cardiology

## 2018-10-14 DIAGNOSIS — R0902 Hypoxemia: Secondary | ICD-10-CM | POA: Diagnosis not present

## 2018-11-14 DIAGNOSIS — R0902 Hypoxemia: Secondary | ICD-10-CM | POA: Diagnosis not present

## 2018-11-24 ENCOUNTER — Other Ambulatory Visit: Payer: Self-pay | Admitting: Internal Medicine

## 2018-11-24 NOTE — Telephone Encounter (Signed)
Ok to refill 

## 2018-11-24 NOTE — Telephone Encounter (Signed)
Dr. Melvyn Novas:  The plan was before COVID: Instructions   Try prednisone 5 mg take one half daily   Please schedule a follow up office visit in 6 weeks, call sooner if needed with cxr and pfts on return       Is it Ok to fill with same direction still?

## 2018-11-28 ENCOUNTER — Other Ambulatory Visit (HOSPITAL_COMMUNITY): Payer: Self-pay | Admitting: Cardiology

## 2018-12-10 ENCOUNTER — Other Ambulatory Visit: Payer: Self-pay | Admitting: Family Medicine

## 2018-12-14 DIAGNOSIS — R0902 Hypoxemia: Secondary | ICD-10-CM | POA: Diagnosis not present

## 2018-12-20 ENCOUNTER — Telehealth: Payer: Self-pay | Admitting: Internal Medicine

## 2018-12-20 NOTE — Telephone Encounter (Signed)
PA for [employer] submitted via [email or web address]  (Key: [id number])

## 2018-12-20 NOTE — Telephone Encounter (Signed)
Request Reference Number: [id number]. REPATHA SURE INJ 140MG /ML is approved through 07/27/2019

## 2019-01-14 DIAGNOSIS — R0902 Hypoxemia: Secondary | ICD-10-CM | POA: Diagnosis not present

## 2019-01-24 ENCOUNTER — Other Ambulatory Visit: Payer: Self-pay | Admitting: *Deleted

## 2019-01-24 NOTE — Patient Outreach (Signed)
[facility]) Care Management  01/24/2019  Kelli Velasquez [birth year] [id number]   RN Health Coach telephone call to patient.  [employer] compliance verified. RN Health coach described the services that [facility] offers [place], Customer service manager. RN discussed that patient has diabetes and her A1C is 5.4. Patient has CHF and HTN. Patient stated that for now she is doing good and no services need but she would appreciate the mailing of the brochure.  Plan: RN sent outreach letter RN send brochure and [place] [facility] [phone]

## 2019-02-13 DIAGNOSIS — R0902 Hypoxemia: Secondary | ICD-10-CM | POA: Diagnosis not present

## 2019-03-16 DIAGNOSIS — R0902 Hypoxemia: Secondary | ICD-10-CM | POA: Diagnosis not present

## 2019-03-21 ENCOUNTER — Ambulatory Visit (INDEPENDENT_AMBULATORY_CARE_PROVIDER_SITE_OTHER)
Admission: RE | Admit: 2019-03-21 | Discharge: 2019-03-21 | Disposition: A | Discharge status: Home / Self Care | Payer: Medicare Other | Source: Ambulatory Visit | Attending: Family Medicine | Admitting: Family Medicine

## 2019-03-21 ENCOUNTER — Ambulatory Visit (INDEPENDENT_AMBULATORY_CARE_PROVIDER_SITE_OTHER): Payer: Medicare Other | Admitting: Family Medicine

## 2019-03-21 ENCOUNTER — Other Ambulatory Visit: Payer: Self-pay

## 2019-03-21 ENCOUNTER — Encounter: Payer: Self-pay | Admitting: Family Medicine

## 2019-03-21 ENCOUNTER — Ambulatory Visit: Payer: Medicare Other | Admitting: Family Medicine

## 2019-03-21 VITALS — BP 132/78 | HR 71 | Temp 97.2°F | Ht 64.5 in

## 2019-03-21 DIAGNOSIS — Z8781 Personal history of (healed) traumatic fracture: Secondary | ICD-10-CM | POA: Insufficient documentation

## 2019-03-21 DIAGNOSIS — S6991XA Unspecified injury of right wrist, hand and finger(s), initial encounter: Secondary | ICD-10-CM

## 2019-03-21 DIAGNOSIS — S6992XA Unspecified injury of left wrist, hand and finger(s), initial encounter: Secondary | ICD-10-CM | POA: Diagnosis not present

## 2019-03-21 DIAGNOSIS — S52502A Unspecified fracture of the lower end of left radius, initial encounter for closed fracture: Secondary | ICD-10-CM | POA: Diagnosis not present

## 2019-03-21 DIAGNOSIS — M79642 Pain in left hand: Secondary | ICD-10-CM | POA: Diagnosis not present

## 2019-03-21 DIAGNOSIS — E538 Deficiency of other specified B group vitamins: Secondary | ICD-10-CM | POA: Diagnosis not present

## 2019-03-21 MED ORDER — CYANOCOBALAMIN 1000 MCG/ML IJ SOLN
1000.0000 ug | Freq: Once | INTRAMUSCULAR | Status: AC
  Administered 2019-03-21: 1000 ug via INTRAMUSCULAR

## 2019-03-21 MED ORDER — TRAMADOL HCL 50 MG PO TABS: 50.0000 mg | ORAL_TABLET | Freq: Three times a day (TID) | ORAL | 0 refills | Status: AC | PRN

## 2019-03-21 NOTE — Assessment & Plan Note (Signed)
Wrist injury from fall on outstretched hand Monday 2 am  Distal radius fx-angulated and displaced  Pt is intolerant of any movement  Wrapped in ace for protection /adv elevation /ice  Tramadol px for pain  inst to watch for numbness or cold fingers Urgent ref to orthopedics-will f/u in am  From surgical standpoint this will be tricky as pt is 02 dependent with lung dz (prednisone dependent) and heart dz also taking plavix

## 2019-03-21 NOTE — Assessment & Plan Note (Signed)
B12 injection today 

## 2019-03-21 NOTE — Progress Notes (Signed)
Subjective:    Patient ID: Kelli Velasquez, female    DOB: 10/24/1940, 78 y.o.   MRN: [id number]  HPI  Pt here for injuries to L wrist and foot after a fall  Monday 2 am   Got up at 2 am to urinate Got up too fast and fell  She landed in some clothes- broke her fall  Needed help from fire dept to get her up   R wrist hit the dresser - a lot of pain  Pain seems to be between ulna and radius  Putting ice on it  Compression wrap  Hurts to move it very much at all (even fingers)  It is swollen but not bruised (swelling is improving)  No numbness now-was at first   For pain is taking acetaminophen 2 every 6 hours  Still on low dose prednisone   Hurt her L 5th toe as well - thinks she stubbed it during the fall    Xray as follows:  FINDINGS: The patient has an acute fracture through the distal metaphysis of the radius with mild dorsal angulation and displacement. No other acute bony abnormality is seen. Advanced first [place] osteoarthritis is noted. There is some soft tissue swelling about the hand.  IMPRESSION: Acute distal radius fracture as described  Of note-pt  Has fractured this wrist in the past    She has h/o osteopenia and chronic prednisone use dexa 9/18   Also due for B12 shot oday  Patient Active Problem List   Diagnosis Date Noted  . Right wrist injury, initial encounter 03/21/2019  . Distal radius fracture, left 03/21/2019  . Low back pain 08/23/2018  . Eczema 07/08/2018  . Hoarseness of voice 01/03/2018  . Coronary artery disease 07/07/2017  . Chronic systolic heart failure ([place]) 07/07/2017  . Abnormal urinalysis 07/07/2017  . History of DVT (deep vein thrombosis) 07/07/2017  . Diabetes mellitus type 2 in obese ([place]) 07/07/2017  . 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)   . Hypoxemia   . Acute on chronic systolic congestive heart failure ([place])   . Encounter for screening mammogram for breast cancer 02/02/2017  . Chronic  combined systolic and diastolic CHF, NYHA class 2 ([place]) 02/02/2017  . Depressed mood 12/02/2016  . Routine general medical examination at a health care facility 12/30/2015  . Supraumbilical hernia [id number]  . Lump in the abdomen 04/10/2015  . GERD (gastroesophageal reflux disease) 10/10/2014  . B12 deficiency 10/09/2014  . Cataracts, bilateral 10/09/2014  . Dry eyes 10/09/2014  . Fatigue 05/08/2014  . Pedal edema 05/08/2014  . Osteopenia 11/28/2013  . Estrogen deficiency 11/02/2013  . Dyspnea on exertion 06/27/2013  . Hernia, incisional 04/03/2013  . 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 Cough 12/18/2012  . Chronic respiratory failure with hypoxia and hypercapnia ([place]) 10/26/2012  . Special screening for malignant neoplasms, colon 06/16/2011  . CAD, NATIVE VESSEL 08/19/2009  . 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 Morbid obesity due to excess calories ([place]) comp by hbp/ hyperlipidemia/ dm/ihd 09/01/2007  . MYOPIA 03/03/2007  . Hyperlipidemia 10/15/2006  . Essential hypertension 10/15/2006  . Allergic rhinitis 10/15/2006  . Cough variant asthma vs UACS  10/15/2006  . OVERACTIVE BLADDER 10/15/2006  . OSTEOARTHRITIS 10/15/2006  . Urinary incontinence 10/15/2006   Past Medical History:  Diagnosis Date  . Allergy    allergic rhinitis  . Asthma   . Chronic bronchitis ([place])   . Coronary artery disease    cath January  2011 with DEs LAD and RCA  . Dyspnea   . Full dentures   . Gallstones   . GERD (gastroesophageal reflux disease)   . History of echocardiogram    Echo 5/17: mod LVH, EF 50-55%, ant-septal HK, Gr 1 DD, mod LAE //  b.  Echo 7/17: mild concentric LVH, EF 45-50%, inf-lat, inf, inf-septal HK, mild LAE  . History of nuclear stress test    Myoview 7/17: EF 48%, small mild apical defect, no ischemia, low risk  . Hyperlipidemia   . Hypertension   . Myocardial infarction ([place])    subendocardial, initial episode, 2010 two stents  placed  . Myopia   . 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 Nocturnal oxygen desaturation    o2 at night  . Obesity   . Osteoarthritis    knees, fingers, shoulders  . Overactive bladder   . Oxygen deficiency    uses oxygen all day  . Rash    and other non specific skin eruptions  . Retaining fluid    in ankles and feet  . Urinary incontinence   . Vertigo   . Wears glasses    Past Surgical History:  Procedure Laterality Date  . CATARACT EXTRACTION W/PHACO Right 12/16/2016   Procedure: CATARACT EXTRACTION PHACO AND INTRAOCULAR LENS PLACEMENT ([place])  right;  Surgeon: Leandrew Koyanagi, MD;  Location: [facility];  Service: Ophthalmology;  Laterality: Right;  . CATARACT EXTRACTION W/PHACO Left 02/03/2017   Procedure: CATARACT EXTRACTION PHACO AND INTRAOCULAR LENS PLACEMENT ([place])  Left  Complicated;  Surgeon: Leandrew Koyanagi, MD;  Location: [facility];  Service: Ophthalmology;  Laterality: Left;  [place] Uses oxygen   . CHOLECYSTECTOMY  92  . COLONOSCOPY    . CORONARY ANGIOGRAPHY N/A 06/16/2017   Procedure: CORONARY ANGIOGRAPHY;  Surgeon: Larey Dresser, MD;  Location: [facility];  Service: Cardiovascular;  Laterality: N/A;  . CORONARY ANGIOPLASTY WITH STENT PLACEMENT  07/2009   stent  . CORONARY STENT INTERVENTION N/A 06/16/2017   Procedure: CORONARY STENT INTERVENTION;  Surgeon: Wellington Hampshire, MD;  Location: [facility];  Service: Cardiovascular;  Laterality: N/A;  . DILATION AND CURETTAGE OF UTERUS  1984  . INCISIONAL HERNIA REPAIR N/A 05/16/2013   Procedure: HERNIA REPAIR INFRAUMILICAL INCISIONAL;  Surgeon: Joyice Faster. Cornett, MD;  Location: [facility];  Service: General;  Laterality: N/A;  umbilical  . INSERTION OF MESH N/A 05/16/2013   Procedure: INSERTION OF MESH;  Surgeon: Joyice Faster. Cornett, MD;  Location: [facility];  Service: General;  Laterality: N/A;  umbilical  . JOINT  REPLACEMENT  2007   right total knee replacement  . RIGHT/LEFT HEART CATH AND CORONARY ANGIOGRAPHY N/A 06/16/2017   Procedure: RIGHT/LEFT HEART CATH AND CORONARY ANGIOGRAPHY;  Surgeon: Larey Dresser, MD;  Location: [facility];  Service: Cardiovascular;  Laterality: N/A;  . TOTAL KNEE ARTHROPLASTY  2010   left , then vocal cord infection post op  . VIDEO BRONCHOSCOPY Bilateral 07/05/2013   Procedure: VIDEO BRONCHOSCOPY WITH FLUORO;  Surgeon: Tanda Rockers, MD;  Location: [facility];  Service: Cardiopulmonary;  Laterality: Bilateral;  . vocal cord polypectomy     Social History   Tobacco Use  . Smoking status: Former Smoker    Packs/day: 1.00    Years: 25.00    Pack years: 25.00    Types: Cigarettes    Quit date: 07/27/1982    Years since  quitting: 36.6  . Smokeless tobacco: Never Used  Substance Use Topics  . Alcohol use: Yes    Alcohol/week: 0.0 standard drinks    Comment: wine-rare  . Drug use: No   Family History  Problem Relation Age of Onset  . Stroke Mother   . Diabetes Mother 63  . Stroke Father   . Heart failure Father   . Breast cancer Maternal Grandmother   . Colon cancer Neg Hx    Allergies  Allergen Reactions  . Fish Allergy Anaphylaxis  . Shellfish Allergy Anaphylaxis  . Aspirin     REACTION: nausea and vomiting High doses  . Atorvastatin     REACTION: leg pain  . Crestor [Rosuvastatin Calcium] Other (See Comments)    Muscle pain - allergy/intolerance  . Penicillins     Due to mold allergy per pt  . Simvastatin     REACTION: muscle pain  . Trandolapril     REACTION: leg pain   Current Outpatient Medications on File Prior to Visit  Medication Sig Dispense Refill  . acetaminophen (TYLENOL) 325 MG tablet Take 650 mg by mouth every 6 (six) hours as needed for mild pain. Take as needed per bottle     . albuterol (PROAIR HFA) 108 (90 Base) MCG/ACT inhaler INHALE 2 PUFFS BY MOUTH EVERY 4 HOURS AS NEEDED FOR WHEEZING OR FOR SHORTNESS OF BREATH  8 Inhaler 5  . aspirin 81 MG tablet Take 81 mg by mouth daily.    . bisoprolol (ZEBETA) 5 MG tablet TAKE 1.5 TABLETS (7.5 MG TOTAL) BY MOUTH DAILY. 135 tablet 2  . chlorpheniramine (CHLOR-TRIMETON) 4 MG tablet Take 4 mg by mouth 2 (two) times daily as needed for allergies.    [place] Kitchen clopidogrel (PLAVIX) 75 MG tablet Take 1 tablet (75 mg total) by mouth daily. 90 tablet 3  . cyanocobalamin (,VITAMIN B-12,) 1000 MCG/ML injection Inject 1,000 mcg into the muscle every 3 (three) months.    . ENTRESTO 97-103 MG TAKE 1 TABLET BY MOUTH TWICE A DAY 60 tablet 6  . EPINEPHrine (EPIPEN 2-PAK) 0.3 mg/0.3 mL IJ SOAJ injection INJECT 0.3 MLS (0.3 MG TOTAL) INTO THE MUSCLE ONCE AS NEEDED FOR ALLERGIC REACTION 2 Device 1  . esomeprazole (NEXIUM) 40 MG capsule Take 1 capsule (40 mg total) by mouth daily. 90 capsule 3  . Evolocumab (REPATHA SURECLICK) [id number] MG/ML SOAJ Inject 1 Dose into the skin every 14 (fourteen) days. 2 pen 11  . ezetimibe (ZETIA) 10 MG tablet TAKE 1 TABLET (10 MG TOTAL) BY MOUTH DAILY. 90 tablet 3  . famotidine (PEPCID) 20 MG tablet Take 40 mg by mouth daily.    . furosemide (LASIX) 20 MG tablet Take 1 tablet (20 mg total) by mouth daily. (Patient taking differently: Take 20 mg by mouth every other day. ) 90 tablet 3  . LANTUS 100 UNIT/ML injection INJECT 22 UNITS INTO THE SKIN DAILY. 30 mL 1  . methocarbamol (ROBAXIN) 500 MG tablet Take 1 tablet (500 mg total) by mouth every 8 (eight) hours as needed for muscle spasms. 90 tablet 0  . OXYGEN 24/7 2 lpm  [employer]    . predniSONE (DELTASONE) 2.5 MG tablet Take 2.5 mg by mouth daily.    [place] Kitchen spironolactone (ALDACTONE) 25 MG tablet TAKE 1/2 TAB BY MOUTH DAILY 45 tablet 1  . triamcinolone cream (KENALOG) 0.1 % Apply 1 application topically daily. To affected areas of ears 30 g 1   No current facility-administered medications on file prior to visit.  Review of Systems  Constitutional: Negative for activity change, appetite change, fatigue, fever and  unexpected weight change.  HENT: Negative for congestion, ear pain, rhinorrhea, sinus pressure and sore throat.   Eyes: Negative for pain, redness and visual disturbance.  Respiratory: Negative for cough, shortness of breath and wheezing.   Cardiovascular: Negative for chest pain and palpitations.  Gastrointestinal: Negative for abdominal pain, blood in stool, constipation and diarrhea.  Endocrine: Negative for polydipsia and polyuria.  Genitourinary: Negative for dysuria, frequency and urgency.  Musculoskeletal: Negative for arthralgias, back pain and myalgias.       L wrist/hand/arm pain after a fall with swelling L 5th toe soreness  Skin: Negative for pallor and rash.  Allergic/Immunologic: Negative for environmental allergies.  Neurological: Negative for dizziness, syncope and headaches.  Hematological: Negative for adenopathy. Does not bruise/bleed easily.  Psychiatric/Behavioral: Negative for decreased concentration and dysphoric mood. The patient is not nervous/anxious.        Objective:   Physical Exam Constitutional:      General: She is not in acute distress.    Appearance: Normal appearance. She is obese. She is not ill-appearing.  HENT:     Head: Normocephalic and atraumatic.  Eyes:     General: No scleral icterus.    Extraocular Movements: Extraocular movements intact.     Conjunctiva/sclera: Conjunctivae normal.     Pupils: Pupils are equal, round, and reactive to light.  Neck:     Musculoskeletal: Normal range of motion. No neck rigidity or muscular tenderness.  Cardiovascular:     Rate and Rhythm: Normal rate.     Pulses: Normal pulses.     Heart sounds: Normal heart sounds.  Pulmonary:     Effort: Pulmonary effort is normal. No respiratory distress.     Breath sounds: Normal breath sounds. No wheezing.     Comments: Wearing 02 by nasal cannula Musculoskeletal:        General: Swelling, tenderness and signs of injury present.     Left wrist: She exhibits  decreased range of motion, tenderness, bony tenderness and swelling. She exhibits no effusion and no crepitus.     Comments: L wrist  (pt holds in her R hand)  Anterior tenderness with small area of ecchymosis at base of first metacarpal  Diffuse swelling  rom in any direction is very painful  Unable to tell if deformity due to habitus   Nl perfusion and sensation  L 5th toe-mildly tender/nl rom without swelling  Lymphadenopathy:     Cervical: No cervical adenopathy.  Skin:    Findings: No erythema or rash.  Neurological:     Mental Status: She is alert.     Sensory: No sensory deficit.     Coordination: Coordination normal.  Psychiatric:        Mood and Affect: Mood normal.           Assessment & Plan:   Problem List Items Addressed This Visit      Musculoskeletal and Integument   Distal radius fracture, left - Primary    Wrist injury from fall on outstretched hand Monday 2 am  Distal radius fx-angulated and displaced  Pt is intolerant of any movement  Wrapped in ace for protection /adv elevation /ice  Tramadol px for pain  inst to watch for numbness or cold fingers Urgent ref to orthopedics-will f/u in am  From surgical standpoint this will be tricky as pt is 02 dependent with lung dz (prednisone dependent) and heart dz also  taking plavix       Relevant Orders   Ambulatory referral to Orthopedic Surgery     Other   B12 deficiency    B12 injection today      Right wrist injury, initial encounter   Relevant Orders   DG Wrist Complete Left (Completed)   DG Hand Complete Left (Completed)   DG Forearm Left (Completed)

## 2019-03-21 NOTE — Patient Instructions (Signed)
You have a distal radius fracture that is mildly angulated and displaced  We are working on an orthopedic visit

## 2019-03-22 DIAGNOSIS — M25532 Pain in left wrist: Secondary | ICD-10-CM | POA: Diagnosis not present

## 2019-03-22 DIAGNOSIS — S52532A Colles' fracture of left radius, initial encounter for closed fracture: Secondary | ICD-10-CM | POA: Diagnosis not present

## 2019-03-24 ENCOUNTER — Other Ambulatory Visit: Payer: Self-pay | Admitting: Family Medicine

## 2019-03-29 DIAGNOSIS — S52532A Colles' fracture of left radius, initial encounter for closed fracture: Secondary | ICD-10-CM | POA: Diagnosis not present

## 2019-04-04 DIAGNOSIS — S52532A Colles' fracture of left radius, initial encounter for closed fracture: Secondary | ICD-10-CM | POA: Diagnosis not present

## 2019-04-14 ENCOUNTER — Encounter: Payer: Self-pay | Admitting: Family Medicine

## 2019-04-16 DIAGNOSIS — R0902 Hypoxemia: Secondary | ICD-10-CM | POA: Diagnosis not present

## 2019-04-18 NOTE — Telephone Encounter (Signed)
Patient left a voicemail stating that she needs to have Dr. Glori Bickers contact [facility] about getting her some home health care. Patient requested a call back.

## 2019-04-19 ENCOUNTER — Telehealth: Payer: Self-pay | Admitting: Family Medicine

## 2019-04-19 NOTE — Telephone Encounter (Signed)
Called Mr Zehnder about names of [place]. It turns out that they have [place] and that these agencys can bill the [place] for these services. I gave him two names of [place] in our area and he will look into this further after checking with his Insurance plan to see what benefits they do in fact have.

## 2019-04-22 ENCOUNTER — Encounter: Payer: Self-pay | Admitting: Family Medicine

## 2019-05-02 DIAGNOSIS — S52532D Colles' fracture of left radius, subsequent encounter for closed fracture with routine healing: Secondary | ICD-10-CM | POA: Diagnosis not present

## 2019-05-15 ENCOUNTER — Ambulatory Visit (INDEPENDENT_AMBULATORY_CARE_PROVIDER_SITE_OTHER): Payer: Medicare Other | Admitting: Family Medicine

## 2019-05-15 ENCOUNTER — Other Ambulatory Visit: Payer: Self-pay

## 2019-05-15 ENCOUNTER — Encounter: Payer: Self-pay | Admitting: Family Medicine

## 2019-05-15 VITALS — BP 128/70 | HR 82 | Temp 96.5°F | Ht 64.5 in | Wt 251.3 lb

## 2019-05-15 DIAGNOSIS — Z23 Encounter for immunization: Secondary | ICD-10-CM | POA: Diagnosis not present

## 2019-05-15 DIAGNOSIS — L309 Dermatitis, unspecified: Secondary | ICD-10-CM | POA: Diagnosis not present

## 2019-05-15 MED ORDER — MOMETASONE FUROATE 0.1 % EX CREA: 1.0000 "application " | TOPICAL_CREAM | Freq: Every day | CUTANEOUS | 3 refills | Status: DC

## 2019-05-15 NOTE — Assessment & Plan Note (Signed)
This has worsened behind ears -possibly in response to 02 tubing and elastic from wearing masks  Some help with triamcinolone in the past Px mometasone cream to see if more helpful Adv against use of cleansers with fragrances/harsh detergents and hot water  Recommend cotton pad or gauze tucked behind 02 tubing (also enc her to explore other opt for wearing 02 with her pulmonologist)

## 2019-05-15 NOTE — Patient Instructions (Addendum)
My favorite soap for folks with eczema is dove for sensitive skin   Avoid hot water  Avoid hair product contact behind ears Try gauze or cotton behind ears tucked behind oxygen tube   Try the mometasone cream as needed for itch/rash   Update if not starting to improve in a week or if worsening    Flu shot today

## 2019-05-15 NOTE — Progress Notes (Signed)
Subjective:    Patient ID: Kelli Velasquez, female    DOB: 10-25-1940, 78 y.o.   MRN: [id number]  HPI Here for ear drainage - behind ears where 02 tube is /skin  H/o eczema  Has used triamcinolone cream   Progressively worse in the past month  The triamcinolone helped briefly  Used it all  She uses scent free soap   Skin is very itchy behind ears  Does not wear a mask often-does not go out much   Unsure if there latex in the tube  Wears 02 all the time   Needs flu shot   Patient Active Problem List   Diagnosis Date Noted  . Right wrist injury, initial encounter 03/21/2019  . Distal radius fracture, left 03/21/2019  . Low back pain 08/23/2018  . Eczema 07/08/2018  . Hoarseness of voice 01/03/2018  . Coronary artery disease 07/07/2017  . Chronic systolic heart failure ([place]) 07/07/2017  . Abnormal urinalysis 07/07/2017  . History of DVT (deep vein thrombosis) 07/07/2017  . Diabetes mellitus type 2 in obese ([place]) 07/07/2017  . 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 Hypoxemia   . Acute on chronic systolic congestive heart failure ([place])   . Encounter for screening mammogram for breast cancer 02/02/2017  . Chronic combined systolic and diastolic CHF, NYHA class 2 ([place]) 02/02/2017  . Depressed mood 12/02/2016  . Routine general medical examination at a health care facility 12/30/2015  . Supraumbilical hernia [id number]  . Lump in the abdomen 04/10/2015  . GERD (gastroesophageal reflux disease) 10/10/2014  . B12 deficiency 10/09/2014  . Cataracts, bilateral 10/09/2014  . Dry eyes 10/09/2014  . Fatigue 05/08/2014  . Pedal edema 05/08/2014  . Osteopenia 11/28/2013  . Estrogen deficiency 11/02/2013  . Dyspnea on exertion 06/27/2013  . Hernia, incisional 04/03/2013  . 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 Cough 12/18/2012  . Chronic respiratory failure with hypoxia and hypercapnia ([place]) 10/26/2012  . Special screening for malignant  neoplasms, colon 06/16/2011  . CAD, NATIVE VESSEL 08/19/2009  . 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 Morbid obesity due to excess calories ([place]) comp by hbp/ hyperlipidemia/ dm/ihd 09/01/2007  . MYOPIA 03/03/2007  . Hyperlipidemia 10/15/2006  . Essential hypertension 10/15/2006  . Allergic rhinitis 10/15/2006  . Cough variant asthma vs UACS  10/15/2006  . OVERACTIVE BLADDER 10/15/2006  . OSTEOARTHRITIS 10/15/2006  . Urinary incontinence 10/15/2006   Past Medical History:  Diagnosis Date  . Allergy    allergic rhinitis  . Asthma   . Chronic bronchitis ([place])   . Coronary artery disease    cath January 2011 with DEs LAD and RCA  . Dyspnea   . Full dentures   . Gallstones   . GERD (gastroesophageal reflux disease)   . History of echocardiogram    Echo 5/17: mod LVH, EF 50-55%, ant-septal HK, Gr 1 DD, mod LAE //  b.  Echo 7/17: mild concentric LVH, EF 45-50%, inf-lat, inf, inf-septal HK, mild LAE  . History of nuclear stress test    Myoview 7/17: EF 48%, small mild apical defect, no ischemia, low risk  . Hyperlipidemia   . Hypertension   . Myocardial infarction ([place])    subendocardial, initial episode, 2010 two stents placed  . Myopia   . 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 Nocturnal oxygen desaturation    o2 at night  . Obesity   . Osteoarthritis  knees, fingers, shoulders  . Overactive bladder   . Oxygen deficiency    uses oxygen all day  . Rash    and other non specific skin eruptions  . Retaining fluid    in ankles and feet  . Urinary incontinence   . Vertigo   . Wears glasses    Past Surgical History:  Procedure Laterality Date  . CATARACT EXTRACTION W/PHACO Right 12/16/2016   Procedure: CATARACT EXTRACTION PHACO AND INTRAOCULAR LENS PLACEMENT ([place])  right;  Surgeon: Leandrew Koyanagi, MD;  Location: [place];  Service: Ophthalmology;  Laterality: Right;  . CATARACT EXTRACTION W/PHACO Left  02/03/2017   Procedure: CATARACT EXTRACTION PHACO AND INTRAOCULAR LENS PLACEMENT ([place])  Left  Complicated;  Surgeon: Leandrew Koyanagi, MD;  Location: [place] [facility];  Service: Ophthalmology;  Laterality: Left;  Malyugin Uses oxygen   . CHOLECYSTECTOMY  92  . COLONOSCOPY    . CORONARY ANGIOGRAPHY N/A 06/16/2017   Procedure: CORONARY ANGIOGRAPHY;  Surgeon: Larey Dresser, MD;  Location: [facility];  Service: Cardiovascular;  Laterality: N/A;  . CORONARY ANGIOPLASTY WITH STENT PLACEMENT  07/2009   stent  . CORONARY STENT INTERVENTION N/A 06/16/2017   Procedure: CORONARY STENT INTERVENTION;  Surgeon: Wellington Hampshire, MD;  Location: [facility];  Service: Cardiovascular;  Laterality: N/A;  . DILATION AND CURETTAGE OF UTERUS  1984  . INCISIONAL HERNIA REPAIR N/A 05/16/2013   Procedure: HERNIA REPAIR INFRAUMILICAL INCISIONAL;  Surgeon: Joyice Faster. Cornett, MD;  Location: [facility];  Service: General;  Laterality: N/A;  umbilical  . INSERTION OF MESH N/A 05/16/2013   Procedure: INSERTION OF MESH;  Surgeon: Joyice Faster. Cornett, MD;  Location: [facility];  Service: General;  Laterality: N/A;  umbilical  . JOINT REPLACEMENT  2007   right total knee replacement  . RIGHT/LEFT HEART CATH AND CORONARY ANGIOGRAPHY N/A 06/16/2017   Procedure: RIGHT/LEFT HEART CATH AND CORONARY ANGIOGRAPHY;  Surgeon: Larey Dresser, MD;  Location: [facility];  Service: Cardiovascular;  Laterality: N/A;  . TOTAL KNEE ARTHROPLASTY  2010   left , then vocal cord infection post op  . VIDEO BRONCHOSCOPY Bilateral 07/05/2013   Procedure: VIDEO BRONCHOSCOPY WITH FLUORO;  Surgeon: Tanda Rockers, MD;  [facility];  Service: Cardiopulmonary;  Laterality: Bilateral;  . vocal cord polypectomy     Social History   Tobacco Use  . Smoking status: Former Smoker    Packs/day: 1.00    Years: 25.00    Pack years: 25.00    Types: Cigarettes    Quit date:  07/27/1982    Years since quitting: 36.8  . Smokeless tobacco: Never Used  Substance Use Topics  . Alcohol use: Yes    Alcohol/week: 0.0 standard drinks    Comment: wine-rare  . Drug use: No   Family History  Problem Relation Age of Onset  . Stroke Mother   . Diabetes Mother 72  . Stroke Father   . Heart failure Father   . Breast cancer Maternal Grandmother   . Colon cancer Neg Hx    Allergies  Allergen Reactions  . Fish Allergy Anaphylaxis  . Shellfish Allergy Anaphylaxis  . Aspirin     REACTION: nausea and vomiting High doses  . Atorvastatin     REACTION: leg pain  . Crestor [Rosuvastatin Calcium] Other (See Comments)    Muscle pain - allergy/intolerance  . Penicillins     Due to mold allergy per pt  .  Simvastatin     REACTION: muscle pain  . Trandolapril     REACTION: leg pain   Current Outpatient Medications on File Prior to Visit  Medication Sig Dispense Refill  . acetaminophen (TYLENOL) 325 MG tablet Take 650 mg by mouth every 6 (six) hours as needed for mild pain. Take as needed per bottle     . albuterol (PROAIR HFA) 108 (90 Base) MCG/ACT inhaler INHALE 2 PUFFS BY MOUTH EVERY 4 HOURS AS NEEDED FOR WHEEZING OR FOR SHORTNESS OF BREATH 8 Inhaler 5  . aspirin 81 MG tablet Take 81 mg by mouth daily.    . bisoprolol (ZEBETA) 5 MG tablet TAKE 1.5 TABLETS (7.5 MG TOTAL) BY MOUTH DAILY. 135 tablet 2  . chlorpheniramine (CHLOR-TRIMETON) 4 MG tablet Take 4 mg by mouth 2 (two) times daily as needed for allergies.    Marland Kitchen clopidogrel (PLAVIX) 75 MG tablet Take 1 tablet (75 mg total) by mouth daily. 90 tablet 3  . cyanocobalamin (,VITAMIN B-12,) 1000 MCG/ML injection Inject 1,000 mcg into the muscle every 3 (three) months.    . ENTRESTO 97-103 MG TAKE 1 TABLET BY MOUTH TWICE A DAY 60 tablet 6  . EPINEPHrine (EPIPEN 2-PAK) 0.3 mg/0.3 mL IJ SOAJ injection INJECT 0.3 MLS (0.3 MG TOTAL) INTO THE MUSCLE ONCE AS NEEDED FOR ALLERGIC REACTION 2 Device 1  . esomeprazole (NEXIUM) 40 MG  capsule Take 1 capsule (40 mg total) by mouth daily. 90 capsule 3  . Evolocumab (REPATHA SURECLICK) [id number] MG/ML SOAJ Inject 1 Dose into the skin every 14 (fourteen) days. 2 pen 11  . ezetimibe (ZETIA) 10 MG tablet TAKE 1 TABLET (10 MG TOTAL) BY MOUTH DAILY. 90 tablet 3  . famotidine (PEPCID) 20 MG tablet Take 40 mg by mouth daily.    . furosemide (LASIX) 20 MG tablet Take 1 tablet (20 mg total) by mouth daily. (Patient taking differently: Take 20 mg by mouth every other day. ) 90 tablet 3  . LANTUS 100 UNIT/ML injection INJECT 22 UNITS INTO THE SKIN DAILY. 30 mL 1  . methocarbamol (ROBAXIN) 500 MG tablet Take 1 tablet (500 mg total) by mouth every 8 (eight) hours as needed for muscle spasms. 90 tablet 0  . OXYGEN 24/7 2 lpm  [employer]    . predniSONE (DELTASONE) 2.5 MG tablet Take 2.5 mg by mouth daily.    Marland Kitchen spironolactone (ALDACTONE) 25 MG tablet TAKE 1/2 TABLET BY MOUTH DAILY 45 tablet 1   No current facility-administered medications on file prior to visit.     Review of Systems  Constitutional: Negative for activity change, appetite change, fatigue, fever and unexpected weight change.  HENT: Negative for congestion, ear pain, rhinorrhea, sinus pressure and sore throat.   Eyes: Negative for pain, redness and visual disturbance.  Respiratory: Positive for shortness of breath. Negative for cough and wheezing.        02 dependent  Baseline sob on exertion   Cardiovascular: Negative for chest pain and palpitations.  Gastrointestinal: Negative for abdominal pain, blood in stool, constipation and diarrhea.  Endocrine: Negative for polydipsia and polyuria.  Genitourinary: Negative for dysuria, frequency and urgency.  Musculoskeletal: Negative for arthralgias, back pain and myalgias.  Skin: Positive for rash. Negative for pallor.       Dermatitis behind ears  Allergic/Immunologic: Negative for environmental allergies.  Neurological: Negative for dizziness, syncope and headaches.  Hematological:  Negative for adenopathy. Does not bruise/bleed easily.  Psychiatric/Behavioral: Negative for decreased concentration and dysphoric mood. The patient is  not nervous/anxious.        Objective:   Physical Exam Constitutional:      General: She is not in acute distress.    Appearance: Normal appearance. She is obese. She is not ill-appearing or diaphoretic.  HENT:     Head: Normocephalic and atraumatic.     Right Ear: Tympanic membrane and ear canal normal. There is no impacted cerumen.     Left Ear: Tympanic membrane and ear canal normal. There is no impacted cerumen.     Ears:     Comments: Skin behind ears under 02 tubing is erythematous and in some areas clear oozing  No vesicles or papules      Mouth/Throat:     Mouth: Mucous membranes are moist.  Eyes:     General:        Right eye: No discharge.        Left eye: No discharge.     Extraocular Movements: Extraocular movements intact.     Conjunctiva/sclera: Conjunctivae normal.     Pupils: Pupils are equal, round, and reactive to light.  Neck:     Musculoskeletal: Normal range of motion and neck supple.  Musculoskeletal:        General: No swelling.  Lymphadenopathy:     Cervical: No cervical adenopathy.  Skin:    General: Skin is warm and dry.     Findings: Erythema and rash present.     Comments: Behind ears  Neurological:     Mental Status: She is alert.     Cranial Nerves: No cranial nerve deficit.  Psychiatric:        Mood and Affect: Mood normal.     Comments: Pleasant            Assessment & Plan:   Problem List Items Addressed This Visit      Musculoskeletal and Integument   Eczema - Primary    This has worsened behind ears -possibly in response to 02 tubing and elastic from wearing masks  Some help with triamcinolone in the past Px mometasone cream to see if more helpful Adv against use of cleansers with fragrances/harsh detergents and hot water  Recommend cotton pad or gauze tucked behind 02  tubing (also enc her to explore other opt for wearing 02 with her pulmonologist)       Other Visit Diagnoses    Need for influenza vaccination       Relevant Orders   Flu Vaccine QUAD High Dose(Fluad) (Completed)

## 2019-05-16 DIAGNOSIS — R0902 Hypoxemia: Secondary | ICD-10-CM | POA: Diagnosis not present

## 2019-05-20 ENCOUNTER — Other Ambulatory Visit: Payer: Self-pay | Admitting: Internal Medicine

## 2019-06-02 ENCOUNTER — Other Ambulatory Visit: Payer: Self-pay | Admitting: Family Medicine

## 2019-06-14 DIAGNOSIS — S52532D Colles' fracture of left radius, subsequent encounter for closed fracture with routine healing: Secondary | ICD-10-CM | POA: Diagnosis not present

## 2019-06-16 ENCOUNTER — Other Ambulatory Visit: Payer: Self-pay | Admitting: Internal Medicine

## 2019-06-16 DIAGNOSIS — R0902 Hypoxemia: Secondary | ICD-10-CM | POA: Diagnosis not present

## 2019-06-16 DIAGNOSIS — E785 Hyperlipidemia, unspecified: Secondary | ICD-10-CM

## 2019-06-27 ENCOUNTER — Other Ambulatory Visit: Payer: Self-pay

## 2019-06-27 ENCOUNTER — Ambulatory Visit: Payer: Medicare Other | Admitting: Internal Medicine

## 2019-06-27 ENCOUNTER — Encounter: Payer: Self-pay | Admitting: Internal Medicine

## 2019-06-27 VITALS — BP 127/63 | HR 66 | Ht 65.5 in | Wt 243.0 lb

## 2019-06-27 DIAGNOSIS — E785 Hyperlipidemia, unspecified: Secondary | ICD-10-CM

## 2019-06-27 DIAGNOSIS — I5022 Chronic systolic (congestive) heart failure: Secondary | ICD-10-CM

## 2019-06-27 DIAGNOSIS — Z789 Other specified health status: Secondary | ICD-10-CM | POA: Diagnosis not present

## 2019-06-27 LAB — LIPID PANEL
Chol/HDL Ratio: 2.4 ratio (ref 0.0–4.4)
Cholesterol, Total: 109 mg/dL (ref 100–199)
HDL: 45 mg/dL (ref >39)
LDL Chol Calc (NIH): 32 mg/dL (ref 0–99)
Triglycerides: 203 mg/dL — ABNORMAL HIGH (ref 0–149)
VLDL Cholesterol Cal: 32 mg/dL (ref 5–40)

## 2019-06-27 MED ORDER — REPATHA SURECLICK 140 MG/ML ~~LOC~~ SOAJ: 1.0000 | SUBCUTANEOUS | 11 refills | Status: DC

## 2019-06-27 NOTE — Patient Instructions (Signed)
Medication Instructions:  Your physician recommends that you continue on your current medications as directed. Please refer to the Current Medication list given to you today.  *If you need a refill on your cardiac medications before your next appointment, please call your pharmacy*  Lab Work: FASTING LIPID 1 [place]   If you have labs (blood work) drawn today and your tests are completely normal, you will receive your results only by: Marland Kitchen MyChart Message (if you have MyChart) OR . A paper copy in the mail If you have any lab test that is abnormal or we need to change your treatment, we will call you to review the results.  Testing/Procedures: NONE   Follow-Up: Dr. Debara Pickett recommends that you schedule a follow up visit with him the in the [facility] in 12 months. Please have fasting blood work about 1 week prior to this visit and he will review the blood work results with you at your appointment. PLEASE CALL THE OFFICE TO GET SCHEDULED IN ABOUT 9-10 MONHTS  Your physician recommends that you schedule a follow-up appointment in: [facility]

## 2019-06-27 NOTE — Progress Notes (Signed)
LIPID CLINIC CONSULT NOTE  Chief Complaint:  Dyslipidemia  Primary Care Physician: Tower, Wynelle Fanny, MD  HPI:  Kelli Velasquez is a 78 y.o. female who is being seen today for the evaluation of dyslipidemia at the request of Dr. Aundra Dubin.  This is a pleasant 78 year old female was kindly referred for evaluation of dyslipidemia.  She has a prior history of coronary artery disease with cath in January 2011 and had placement of drug-eluting stents in the LAD and RCA.  She is an established patient of Dr. Aundra Dubin in the heart failure clinic and has an ischemic cardiomyopathy with LVEF 40 to 45%.  In addition she has dyslipidemia which is not at goal.  She has been intolerant to statins, including atorvastatin, rosuvastatin, simvastatin and most recently pravastatin.  Her most recent lipid profile showed total cholesterol 188, triglycerides 305, HDL 40 and LDL 87, however this is on pravastatin however she recently discontinued it again due to myalgias.  She is referred for evaluation of possible PCSK9 inhibitor therapy.  06/27/2019  Kelli Velasquez returns today for follow-up and monitoring of Repatha.  She is tolerating it well.  She denies any side effects that she had with the statins.  Labs were drawn this morning and showed total cholesterol is now down to 109, triglycerides 203, HDL 45 and LDL of 32.  Overall significant improvement.  Her blood pressure is well controlled at 127/63.  She denies any significant worsening shortness of breath but does have a history of heart failure and had been followed by Dr. Aundra Dubin.  I encouraged her to make a follow-up with him.  She is on ezetimibe as well.  PMHx:   Past Medical History:  Diagnosis Date  . Allergy    allergic rhinitis  . Asthma   . Chronic bronchitis ([place])   . Coronary artery disease    cath January 2011 with DEs LAD and RCA  . Dyspnea   . Full dentures   . Gallstones   . GERD (gastroesophageal reflux disease)   . History of echocardiogram     Echo 5/17: mod LVH, EF 50-55%, ant-septal HK, Gr 1 DD, mod LAE //  b.  Echo 7/17: mild concentric LVH, EF 45-50%, inf-lat, inf, inf-septal HK, mild LAE  . History of nuclear stress test    Myoview 7/17: EF 48%, small mild apical defect, no ischemia, low risk  . Hyperlipidemia   . Hypertension   . Myocardial infarction ([place])    subendocardial, initial episode, 2010 two stents placed  . Myopia   . 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 Nocturnal oxygen desaturation    o2 at night  . Obesity   . Osteoarthritis    knees, fingers, shoulders  . Overactive bladder   . Oxygen deficiency    uses oxygen all day  . Rash    and other non specific skin eruptions  . Retaining fluid    in ankles and feet  . Urinary incontinence   . Vertigo   . Wears glasses     Past Surgical History:  Procedure Laterality Date  . CATARACT EXTRACTION W/PHACO Right 12/16/2016   Procedure: CATARACT EXTRACTION PHACO AND INTRAOCULAR LENS PLACEMENT ([place])  right;  Surgeon: Leandrew Koyanagi, MD;  Location: [facility];  Service: Ophthalmology;  Laterality: Right;  . CATARACT EXTRACTION W/PHACO Left 02/03/2017   Procedure: CATARACT EXTRACTION PHACO AND INTRAOCULAR LENS PLACEMENT ([place])  Left  Complicated;  Surgeon: Wallace Going,  Nila Nephew, MD;  Location: [facility];  Service: Ophthalmology;  Laterality: Left;  Malyugin Uses oxygen   . CHOLECYSTECTOMY  92  . COLONOSCOPY    . CORONARY ANGIOGRAPHY N/A 06/16/2017   Procedure: CORONARY ANGIOGRAPHY;  Surgeon: Larey Dresser, MD;  Location: [facility];  Service: Cardiovascular;  Laterality: N/A;  . CORONARY ANGIOPLASTY WITH STENT PLACEMENT  07/2009   stent  . CORONARY STENT INTERVENTION N/A 06/16/2017   Procedure: CORONARY STENT INTERVENTION;  Surgeon: Wellington Hampshire, MD;  Location: [facility];  Service: Cardiovascular;  Laterality: N/A;  . DILATION AND CURETTAGE OF UTERUS  1984  . INCISIONAL HERNIA REPAIR  N/A 05/16/2013   Procedure: HERNIA REPAIR INFRAUMILICAL INCISIONAL;  Surgeon: Joyice Faster. Cornett, MD;  Location: [facility];  Service: General;  Laterality: N/A;  umbilical  . INSERTION OF MESH N/A 05/16/2013   Procedure: INSERTION OF MESH;  Surgeon: Joyice Faster. Cornett, MD;  Location: [facility];  Service: General;  Laterality: N/A;  umbilical  . JOINT REPLACEMENT  2007   right total knee replacement  . RIGHT/LEFT HEART CATH AND CORONARY ANGIOGRAPHY N/A 06/16/2017   Procedure: RIGHT/LEFT HEART CATH AND CORONARY ANGIOGRAPHY;  Surgeon: Larey Dresser, MD;  Location: [facility];  Service: Cardiovascular;  Laterality: N/A;  . TOTAL KNEE ARTHROPLASTY  2010   left , then vocal cord infection post op  . VIDEO BRONCHOSCOPY Bilateral 07/05/2013   Procedure: VIDEO BRONCHOSCOPY WITH FLUORO;  Surgeon: Tanda Rockers, MD;  [facility];  Service: Cardiopulmonary;  Laterality: Bilateral;  . vocal cord polypectomy      FAMHx:  Family History  Problem Relation Age of Onset  . Stroke Mother   . Diabetes Mother 69  . Stroke Father   . Heart failure Father   . Breast cancer Maternal Grandmother   . Colon cancer Neg Hx     SOCHx:   reports that she quit smoking about 36 years ago. Her smoking use included cigarettes. She has a 25.00 pack-year smoking history. She has never used smokeless tobacco. She reports current alcohol use. She reports that she does not use drugs.  ALLERGIES:  Allergies  Allergen Reactions  . Fish Allergy Anaphylaxis  . Shellfish Allergy Anaphylaxis  . Aspirin     REACTION: nausea and vomiting High doses  . Atorvastatin     REACTION: leg pain  . Crestor [Rosuvastatin Calcium] Other (See Comments)    Muscle pain - allergy/intolerance  . Penicillins     Due to mold allergy per pt  . Simvastatin     REACTION: muscle pain  . Trandolapril     REACTION: leg pain    ROS: Pertinent items noted in HPI and remainder of  comprehensive ROS otherwise negative.  HOME MEDS: Current Outpatient Medications on File Prior to Visit  Medication Sig Dispense Refill  . acetaminophen (TYLENOL) 325 MG tablet Take 650 mg by mouth every 6 (six) hours as needed for mild pain. Take as needed per bottle     . albuterol (PROAIR HFA) 108 (90 Base) MCG/ACT inhaler INHALE 2 PUFFS BY MOUTH EVERY 4 HOURS AS NEEDED FOR WHEEZING OR FOR SHORTNESS OF BREATH 8 Inhaler 5  . aspirin 81 MG tablet Take 81 mg by mouth daily.    . bisoprolol (ZEBETA) 5 MG tablet TAKE 1.5 TABLETS (7.5 MG TOTAL) BY MOUTH DAILY. 135 tablet 2  . chlorpheniramine (CHLOR-TRIMETON) 4 MG tablet Take 4 mg by mouth 2 (two)  times daily as needed for allergies.    Marland Kitchen clopidogrel (PLAVIX) 75 MG tablet Take 1 tablet (75 mg total) by mouth daily. 90 tablet 3  . cyanocobalamin (,VITAMIN B-12,) 1000 MCG/ML injection Inject 1,000 mcg into the muscle every 3 (three) months.    . ENTRESTO 97-103 MG TAKE 1 TABLET BY MOUTH TWICE A DAY 60 tablet 6  . EPINEPHrine (EPIPEN 2-PAK) 0.3 mg/0.3 mL IJ SOAJ injection INJECT 0.3 MLS (0.3 MG TOTAL) INTO THE MUSCLE ONCE AS NEEDED FOR ALLERGIC REACTION 2 Device 1  . esomeprazole (NEXIUM) 40 MG capsule Take 1 capsule (40 mg total) by mouth daily. 90 capsule 3  . ezetimibe (ZETIA) 10 MG tablet TAKE 1 TABLET (10 MG TOTAL) BY MOUTH DAILY. 90 tablet 3  . famotidine (PEPCID) 20 MG tablet Take 40 mg by mouth daily.    . furosemide (LASIX) 20 MG tablet Take 1 tablet (20 mg total) by mouth daily. (Patient taking differently: Take 20 mg by mouth every other day. ) 90 tablet 3  . LANTUS 100 UNIT/ML injection INJECT 22 UNITS INTO THE SKIN DAILY. (VIAL EXPIRES 28 DAYS AFTER FIRST USE) 30 mL 1  . methocarbamol (ROBAXIN) 500 MG tablet Take 1 tablet (500 mg total) by mouth every 8 (eight) hours as needed for muscle spasms. 90 tablet 0  . mometasone (ELOCON) 0.1 % cream Apply 1 application topically daily. 15 g 3  . OXYGEN 24/7 2 lpm  [employer]    . predniSONE  (DELTASONE) 2.5 MG tablet Take 2.5 mg by mouth daily.    . predniSONE (DELTASONE) 5 MG tablet TAKE 1 TABLET BY MOUTH EVERY DAY WITH BREAKFAST 90 tablet 0  . spironolactone (ALDACTONE) 25 MG tablet TAKE 1/2 TABLET BY MOUTH DAILY 45 tablet 1   No current facility-administered medications on file prior to visit.     LABS/IMAGING: Results for orders placed or performed in visit on 07/12/18 (from the past 48 hour(s))  Lipid panel     Status: Abnormal   Collection Time: 06/27/19  9:36 AM  Result Value Ref Range   Cholesterol, Total 109 100 - 199 mg/dL   Triglycerides 203 (H) 0 - 149 mg/dL   HDL 45 >39 mg/dL   VLDL Cholesterol Cal 32 5 - 40 mg/dL   LDL Chol Calc (NIH) 32 0 - 99 mg/dL   Chol/HDL Ratio 2.4 0.0 - 4.4 ratio    Comment:                                   T. Chol/HDL Ratio                                             Men  Women                               1/2 Avg.Risk  3.4    3.3                                   Avg.Risk  5.0    4.4  2X Avg.Risk  9.6    7.1                                3X Avg.Risk 23.4   11.0    No results found.  LIPID PANEL:    Component Value Date/Time   CHOL 109 06/27/2019 0936   TRIG 203 (H) 06/27/2019 0936   HDL 45 06/27/2019 0936   CHOLHDL 2.4 06/27/2019 0936   CHOLHDL 2.4 09/30/2018 1241   VLDL 40 09/30/2018 1241   LDLCALC 32 06/27/2019 0936   LDLDIRECT 121.0 07/06/2018 1216    WEIGHTS: Wt Readings from Last 3 Encounters:  06/27/19 243 lb (110.2 kg)  05/15/19 251 lb 5 oz (114 kg)  09/30/18 246 lb (111.6 kg)    VITALS: BP 127/63   Pulse 66   Ht 5' 5.5" (1.664 m)   Wt 243 lb (110.2 kg)   LMP 07/27/1980   SpO2 94%   BMI 39.82 kg/m   EXAM: Deferred  EKG: Deferred  ASSESSMENT: 1. Coronary artery disease status post PCI x2 (2011) 2. Ischemic cardiomyopathy EF 40 to 45% 3. Mixed dyslipidemia -goal LDL less than 70 4. Statin intolerant  PLAN: 1.   Kelli Velasquez has had significant improvement in  her lipids which is fairly stable on Repatha.  We will process renewals for this but not recommend any changes in her medicines today.  I encouraged her to reach out to Dr. Aundra Dubin for follow-up in the heart failure clinic which was delayed due to Covid.  Pixie Casino, MD, [facility], [place] Director of the Advanced Lipid Disorders &  Cardiovascular Risk Reduction Clinic Diplomate of the American Board of Clinical Lipidology Attending Cardiologist  Direct Dial: [phone]  Fax: [phone]  Website:  [email or web address]  06/27/2019, 5:10 PM

## 2019-06-27 NOTE — Progress Notes (Signed)
Please arrange followup for this patient.

## 2019-06-28 ENCOUNTER — Telehealth: Payer: Self-pay | Admitting: Internal Medicine

## 2019-06-28 NOTE — Telephone Encounter (Signed)
Called and spoke with patient regarding appointment with Dr. Aundra Dubin in the Heart Failure Clinic-----Tuesday 07/11/19 at 10:40am.  She states while she was here to see Dr. Woodfin Ganja mentioned she might be able to stop taking the Zetia, but he never confirmed.   Please call and advise.

## 2019-06-28 NOTE — Telephone Encounter (Signed)
Patient called w/results and made aware of MD comments noted below.

## 2019-06-28 NOTE — Telephone Encounter (Signed)
PA for Repatha [employer] submitted via [email or web address] ([id number])

## 2019-06-28 NOTE — Telephone Encounter (Signed)
Patient approved until 07/26/2020

## 2019-06-28 NOTE — Telephone Encounter (Signed)
I did not see this in MD note from 06/27/2019 Sent to MD to advise

## 2019-06-28 NOTE — Telephone Encounter (Signed)
I mentioned if the LDL was below 25 (labs were drawn day of appt, so did not get them until after appt) - then we could stop zetia, however, I would just recommend continuing her current meds.  Dr Lemmie Evens

## 2019-06-29 IMAGING — MR MR HEAD W/O CM
8 of 10 series · 33 of 48 positions shown · non-contrast
Comparison: [facility] CT from 06/11/2017.

CLINICAL DATA: Initial evaluation for acute altered mental status.

EXAM:
MRI HEAD WITHOUT CONTRAST
TECHNIQUE: Multiplanar, multiecho pulse sequences of the brain and surrounding
structures were obtained without intravenous contrast.

[Series 4: DWI · axial · 3.0mm · 0.94mm/px · z∈[-52,+104]mm · 8 of 105 slices shown (1 of 2)]
[im 1/105]
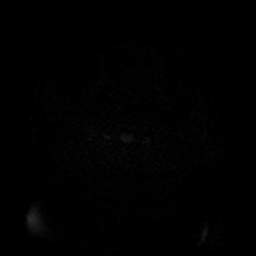
[im 15/105]
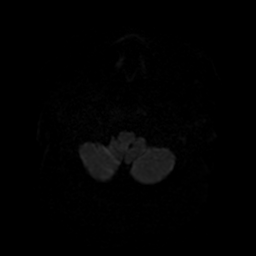
[im 30/105]
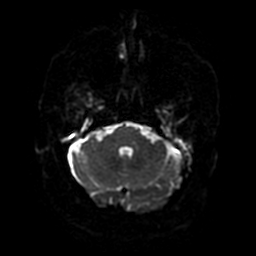
[im 45/105]
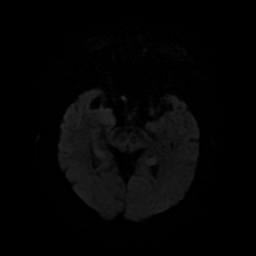
[im 60/105]
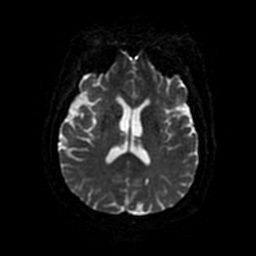
[im 75/105]
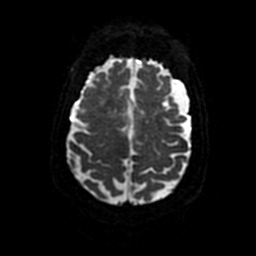
[im 90/105]
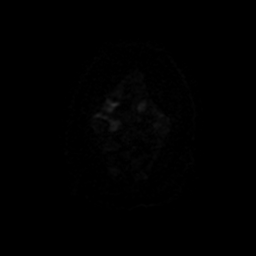
[im 105/105]
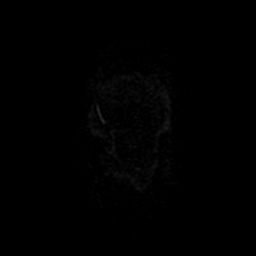

[Series 5: DWI · coronal · 4.0mm · 0.94mm/px · 6 of 72 slices shown (2 of 2)]
[im 1/72]
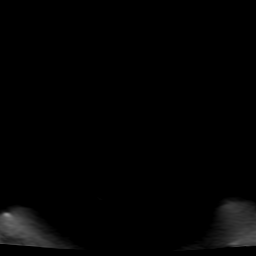
[im 15/72]
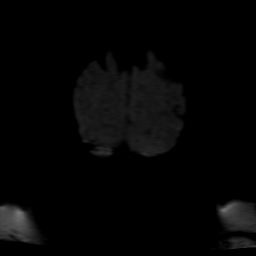
[im 29/72]
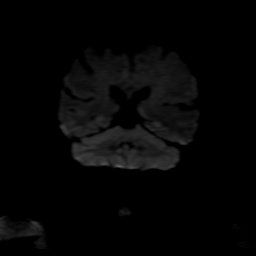
[im 43/72]
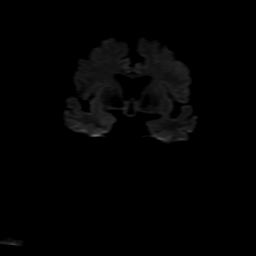
[im 57/72]
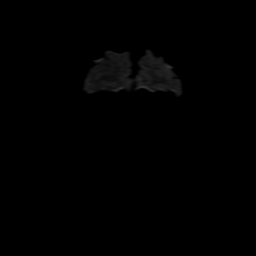
[im 72/72]
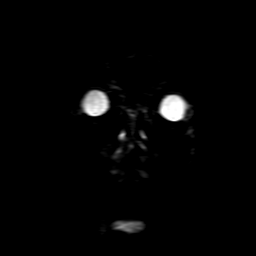

[Series 7: T2 · axial · 5.0mm · 0.49mm/px · z∈[-51,+116]mm · 3 of 29 slices shown (1 of 2)]
[im 1/29]
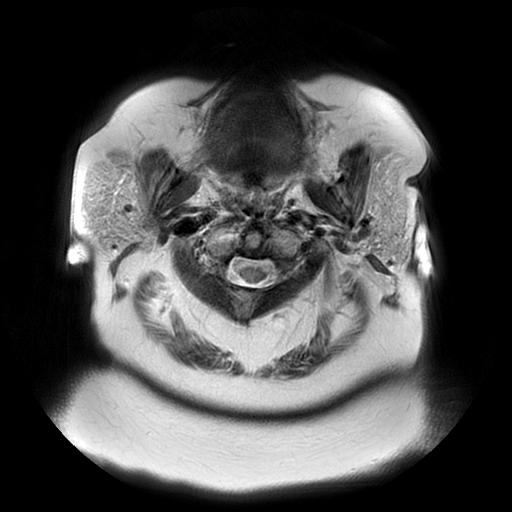
[im 15/29]
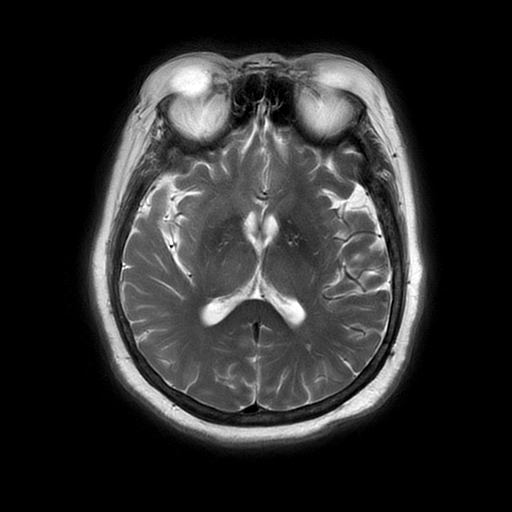
[im 29/29]
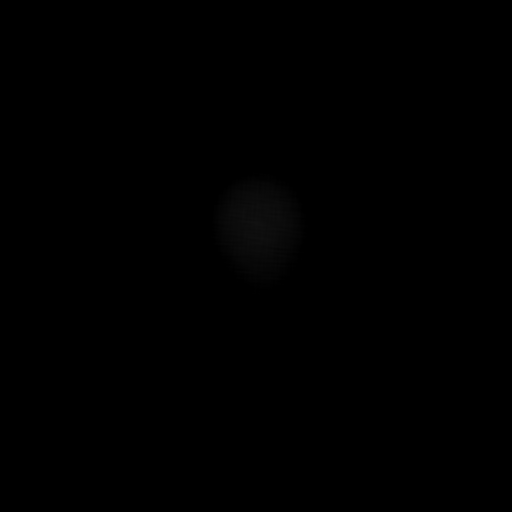

[Series 8: FLAIR · axial · 3.0mm · 0.47mm/px · z∈[-51,+116]mm · 3 of 29 slices shown (1 of 2)]
[im 1/29]
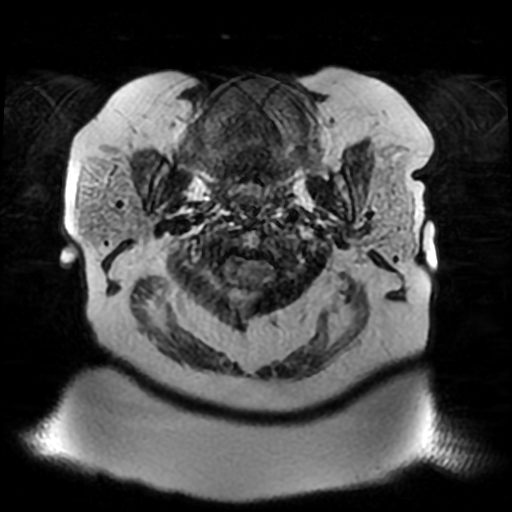
[im 15/29]
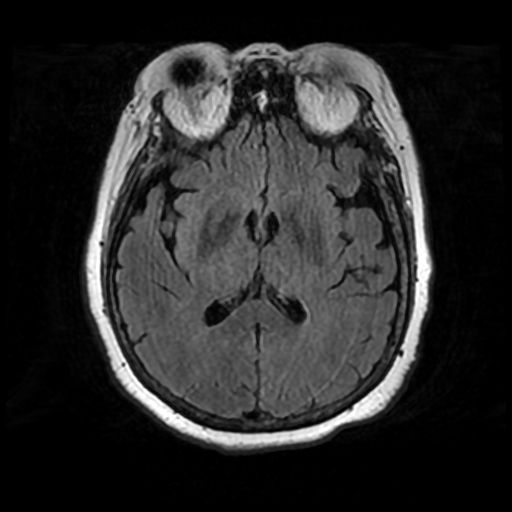
[im 29/29]
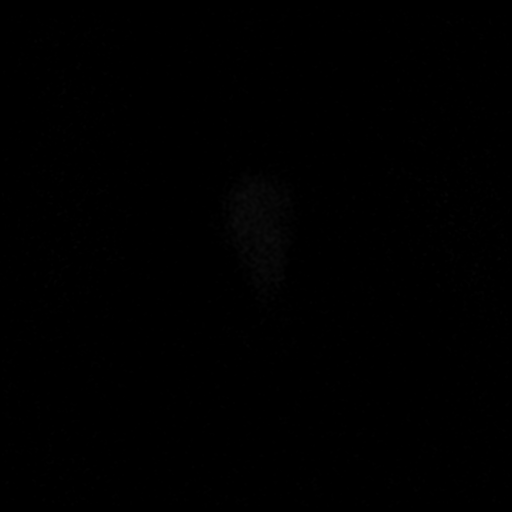

[Series 11: T2 · coronal · 5.0mm · 0.43mm/px · 3 of 30 slices shown (2 of 2)]
[im 1/30]
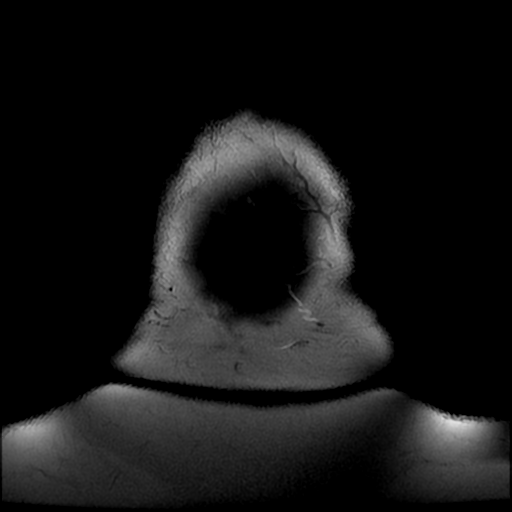
[im 15/30]
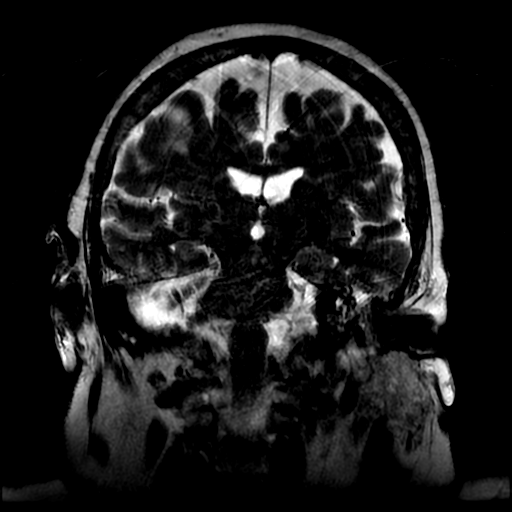
[im 30/30]
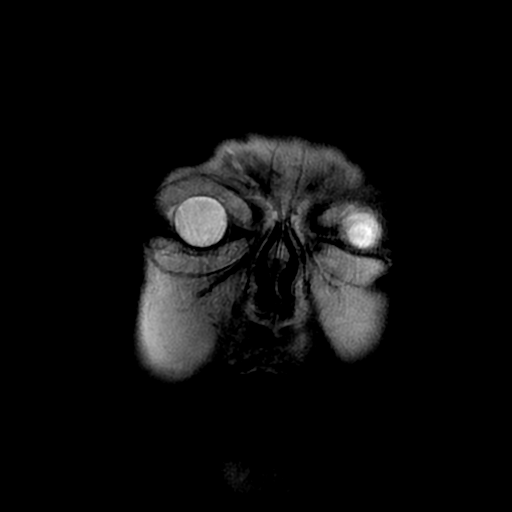

[Series 12: FLAIR · sagittal · 5.0mm · 0.49mm/px · 2 of 25 slices shown (2 of 2)]
[im 1/25]
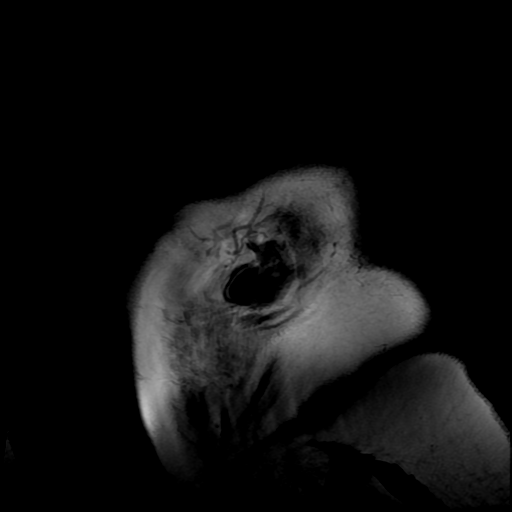
[im 25/25]
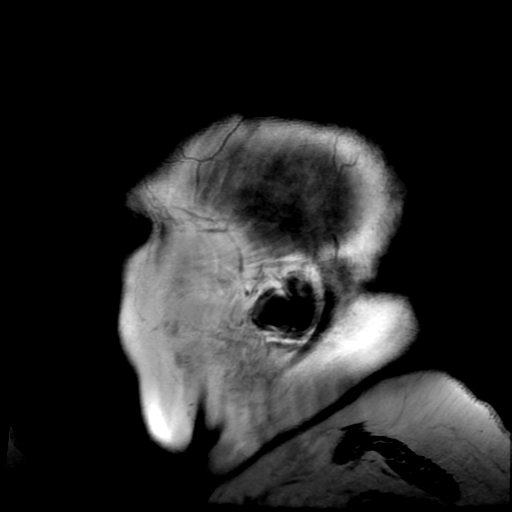

[Series 450: ADC · axial · 3.0mm · 0.94mm/px · z∈[-52,+104]mm · 5 of 53 slices shown (1 of 2)]
[im 1/53]
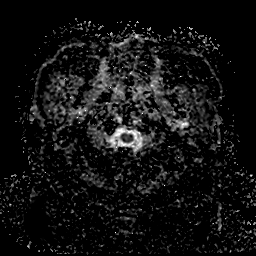
[im 14/53]
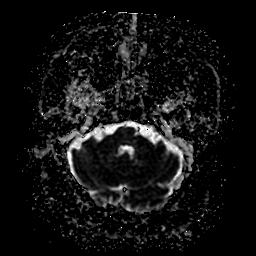
[im 27/53]
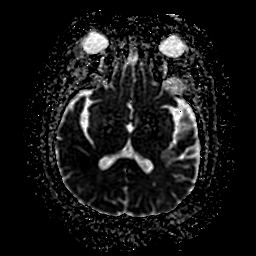
[im 40/53]
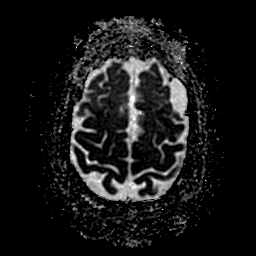
[im 53/53]
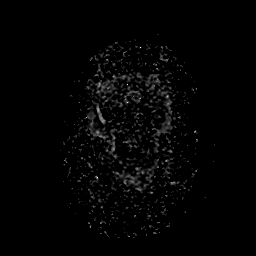

[Series 550: ADC · coronal · 4.0mm · 0.94mm/px · 3 of 36 slices shown (2 of 2)]
[im 1/36]
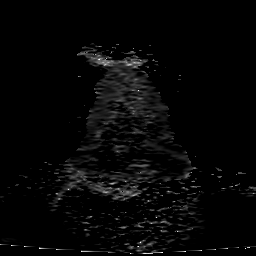
[im 18/36]
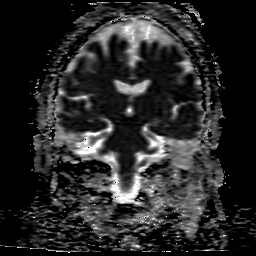
[im 36/36]
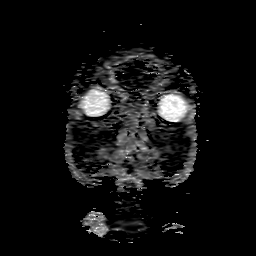

[33 of 48 positions shown; findings below may reference images not displayed]

FINDINGS: Brain: Study mildly degraded by motion artifact.

Generalized age related cerebral atrophy. No significant cerebral
white matter disease for age. 7 mm focus of T2 hyperintensity
involving the cortical gray matter of the anterior left frontal lobe
may reflect a small remote ischemic infarct (series 7, image 20).
Tiny remote right cerebellar infarct. Cystic lucency within the deep
white matter of the left parietal lobe favored to reflect a dilated
perivascular space.

No abnormal foci of restricted diffusion to suggest acute or
subacute ischemia. Gray-white matter differentiation maintained. No
evidence for acute or chronic intracranial hemorrhage.

No mass lesion, midline shift or mass effect. No hydrocephalus.
cm extra-axial cystic lesion overlying the left frontal convexity
most consistent with a arachnoid cyst. No associated edema or
significant mass effect. No other extra-axial fluid collection.

Incidental note made of an empty sella. Midline structures intact
and normal.

Vascular: Major intracranial vascular flow voids maintained.

Skull and upper cervical spine: Craniocervical junction within
normal limits. Multilevel degenerative spondylolysis noted within
the cervical spine. Bone marrow signal intensity within normal
limits. Hyperostosis frontalis interna noted. Scalp soft tissues
within normal limits.

Sinuses/Orbits: Globes oral soft tissues within normal limits.
Patient status post lens extraction bilaterally. Chronic right
sphenoid sinus disease. Paranasal sinuses otherwise clear. Bilateral
mastoid effusions noted.

Other: None.
IMPRESSION: 1. No acute intracranial abnormality.
2. Small remote left frontal and right cerebellar infarcts.
3. 3.6 cm arachnoid cyst overlying the left frontal convexity
without associated edema or mass effect. Finding felt to be
incidental in nature, and of doubtful clinical significance in the
acute setting.
4. Chronic right sphenoid sinus disease with bilateral mastoid
effusions.

## 2019-06-29 NOTE — Progress Notes (Signed)
PT has been scheduled.

## 2019-07-09 ENCOUNTER — Telehealth: Payer: Self-pay | Admitting: Family Medicine

## 2019-07-09 DIAGNOSIS — E7800 Pure hypercholesterolemia, unspecified: Secondary | ICD-10-CM

## 2019-07-09 DIAGNOSIS — E669 Obesity, unspecified: Secondary | ICD-10-CM

## 2019-07-09 DIAGNOSIS — I10 Essential (primary) hypertension: Secondary | ICD-10-CM

## 2019-07-09 DIAGNOSIS — E1169 Type 2 diabetes mellitus with other specified complication: Secondary | ICD-10-CM

## 2019-07-09 DIAGNOSIS — E538 Deficiency of other specified B group vitamins: Secondary | ICD-10-CM

## 2019-07-09 NOTE — Telephone Encounter (Signed)
-----   Message from Ellamae Sia sent at 07/04/2019  2:30 PM EST ----- Regarding: lab orders for Monday, 12.14.20 Patient is scheduled for CPX labs, please order future labs, Thanks , Karna Christmas

## 2019-07-10 ENCOUNTER — Ambulatory Visit: Payer: Self-pay

## 2019-07-10 ENCOUNTER — Other Ambulatory Visit (INDEPENDENT_AMBULATORY_CARE_PROVIDER_SITE_OTHER): Payer: Medicare Other

## 2019-07-10 ENCOUNTER — Other Ambulatory Visit: Payer: Medicare Other

## 2019-07-10 ENCOUNTER — Other Ambulatory Visit: Payer: Self-pay

## 2019-07-10 DIAGNOSIS — I10 Essential (primary) hypertension: Secondary | ICD-10-CM

## 2019-07-10 DIAGNOSIS — E1169 Type 2 diabetes mellitus with other specified complication: Secondary | ICD-10-CM | POA: Diagnosis not present

## 2019-07-10 DIAGNOSIS — E669 Obesity, unspecified: Secondary | ICD-10-CM | POA: Diagnosis not present

## 2019-07-10 DIAGNOSIS — E7800 Pure hypercholesterolemia, unspecified: Secondary | ICD-10-CM

## 2019-07-10 DIAGNOSIS — E538 Deficiency of other specified B group vitamins: Secondary | ICD-10-CM

## 2019-07-10 LAB — LIPID PANEL
Cholesterol: 116 mg/dL (ref 0–200)
HDL: 50.7 mg/dL (ref >39.00)
LDL Cholesterol: 28 mg/dL (ref 0–99)
NonHDL: 65.59
Total CHOL/HDL Ratio: 2
Triglycerides: 190 mg/dL — ABNORMAL HIGH (ref 0.0–149.0)
VLDL: 38 mg/dL (ref 0.0–40.0)

## 2019-07-10 LAB — COMPREHENSIVE METABOLIC PANEL
ALT: 17 U/L (ref 0–35)
AST: 15 U/L (ref 0–37)
Albumin: 3.9 g/dL (ref 3.5–5.2)
Alkaline Phosphatase: 61 U/L (ref 39–117)
BUN: 26 mg/dL — ABNORMAL HIGH (ref 6–23)
CO2: 28 mEq/L (ref 19–32)
Calcium: 9.2 mg/dL (ref 8.4–10.5)
Chloride: 103 mEq/L (ref 96–112)
Creatinine, Ser: 1.31 mg/dL — ABNORMAL HIGH (ref 0.40–1.20)
GFR: 39.12 mL/min — ABNORMAL LOW (ref >60.00)
Glucose, Bld: 97 mg/dL (ref 70–99)
Potassium: 4.5 mEq/L (ref 3.5–5.1)
Sodium: 142 mEq/L (ref 135–145)
Total Bilirubin: 0.3 mg/dL (ref 0.2–1.2)
Total Protein: 6.9 g/dL (ref 6.0–8.3)

## 2019-07-10 LAB — CBC WITH DIFFERENTIAL/PLATELET
Basophils Absolute: 0.1 10*3/uL (ref 0.0–0.1)
Basophils Relative: 0.8 % (ref 0.0–3.0)
Eosinophils Absolute: 0.5 10*3/uL (ref 0.0–0.7)
Eosinophils Relative: 4.3 % (ref 0.0–5.0)
HCT: 37.7 % (ref 36.0–46.0)
Hemoglobin: 12.1 g/dL (ref 12.0–15.0)
Lymphocytes Relative: 24.1 % (ref 12.0–46.0)
Lymphs Abs: 2.7 10*3/uL (ref 0.7–4.0)
MCHC: 31.9 g/dL (ref 30.0–36.0)
MCV: 99.7 fl (ref 78.0–100.0)
Monocytes Absolute: 0.8 10*3/uL (ref 0.1–1.0)
Monocytes Relative: 7.2 % (ref 3.0–12.0)
Neutro Abs: 7.1 10*3/uL (ref 1.4–7.7)
Neutrophils Relative %: 63.6 % (ref 43.0–77.0)
Platelets: 264 10*3/uL (ref 150.0–400.0)
RBC: 3.79 Mil/uL — ABNORMAL LOW (ref 3.87–5.11)
RDW: 15.1 % (ref 11.5–15.5)
WBC: 11.2 10*3/uL — ABNORMAL HIGH (ref 4.0–10.5)

## 2019-07-10 LAB — TSH: TSH: 2.53 u[IU]/mL (ref 0.35–4.50)

## 2019-07-10 LAB — HEMOGLOBIN A1C: Hgb A1c MFr Bld: 5.5 % (ref 4.6–6.5)

## 2019-07-10 LAB — VITAMIN B12: Vitamin B-12: 522 pg/mL (ref 211–911)

## 2019-07-11 ENCOUNTER — Ambulatory Visit (HOSPITAL_COMMUNITY)
Admission: RE | Admit: 2019-07-11 | Discharge: 2019-07-11 | Disposition: A | Discharge status: Home / Self Care | Payer: Medicare Other | Source: Ambulatory Visit | Attending: Cardiology | Admitting: Cardiology

## 2019-07-11 ENCOUNTER — Other Ambulatory Visit: Payer: Self-pay

## 2019-07-11 ENCOUNTER — Encounter (HOSPITAL_COMMUNITY): Payer: Self-pay | Admitting: Cardiology

## 2019-07-11 ENCOUNTER — Other Ambulatory Visit: Payer: Self-pay | Admitting: Family Medicine

## 2019-07-11 VITALS — BP 138/57 | HR 65 | Wt 247.2 lb

## 2019-07-11 DIAGNOSIS — Z7952 Long term (current) use of systemic steroids: Secondary | ICD-10-CM | POA: Diagnosis not present

## 2019-07-11 DIAGNOSIS — Z87440 Personal history of urinary (tract) infections: Secondary | ICD-10-CM | POA: Insufficient documentation

## 2019-07-11 DIAGNOSIS — I5022 Chronic systolic (congestive) heart failure: Secondary | ICD-10-CM

## 2019-07-11 DIAGNOSIS — Z86718 Personal history of other venous thrombosis and embolism: Secondary | ICD-10-CM | POA: Diagnosis not present

## 2019-07-11 DIAGNOSIS — Z794 Long term (current) use of insulin: Secondary | ICD-10-CM | POA: Insufficient documentation

## 2019-07-11 DIAGNOSIS — M545 Low back pain: Secondary | ICD-10-CM | POA: Insufficient documentation

## 2019-07-11 DIAGNOSIS — Z7902 Long term (current) use of antithrombotics/antiplatelets: Secondary | ICD-10-CM | POA: Insufficient documentation

## 2019-07-11 DIAGNOSIS — I2510 Atherosclerotic heart disease of native coronary artery without angina pectoris: Secondary | ICD-10-CM | POA: Insufficient documentation

## 2019-07-11 DIAGNOSIS — Z7982 Long term (current) use of aspirin: Secondary | ICD-10-CM | POA: Diagnosis not present

## 2019-07-11 DIAGNOSIS — Z955 Presence of coronary angioplasty implant and graft: Secondary | ICD-10-CM | POA: Insufficient documentation

## 2019-07-11 DIAGNOSIS — I255 Ischemic cardiomyopathy: Secondary | ICD-10-CM | POA: Insufficient documentation

## 2019-07-11 DIAGNOSIS — Z87891 Personal history of nicotine dependence: Secondary | ICD-10-CM | POA: Diagnosis not present

## 2019-07-11 DIAGNOSIS — K219 Gastro-esophageal reflux disease without esophagitis: Secondary | ICD-10-CM | POA: Insufficient documentation

## 2019-07-11 DIAGNOSIS — E785 Hyperlipidemia, unspecified: Secondary | ICD-10-CM | POA: Diagnosis not present

## 2019-07-11 DIAGNOSIS — Z9981 Dependence on supplemental oxygen: Secondary | ICD-10-CM | POA: Insufficient documentation

## 2019-07-11 DIAGNOSIS — M25559 Pain in unspecified hip: Secondary | ICD-10-CM | POA: Diagnosis not present

## 2019-07-11 DIAGNOSIS — Z8249 Family history of ischemic heart disease and other diseases of the circulatory system: Secondary | ICD-10-CM | POA: Insufficient documentation

## 2019-07-11 DIAGNOSIS — I110 Hypertensive heart disease with heart failure: Secondary | ICD-10-CM | POA: Insufficient documentation

## 2019-07-11 DIAGNOSIS — Z79899 Other long term (current) drug therapy: Secondary | ICD-10-CM | POA: Diagnosis not present

## 2019-07-11 DIAGNOSIS — I509 Heart failure, unspecified: Secondary | ICD-10-CM | POA: Diagnosis present

## 2019-07-11 NOTE — Patient Instructions (Signed)
EKG done today in office  No medication changes today!  Your physician has requested that you have an echocardiogram. Echocardiography is a painless test that uses sound waves to create images of your heart. It provides your doctor with information about the size and shape of your heart and how well your heart's chambers and valves are working. This procedure takes approximately one hour. There are no restrictions for this procedure.  Your physician recommends that you schedule a follow-up appointment in: 3 months with an echo w/ Dr Aundra Dubin  At the [facility], you and your health needs are our priority. As part of our continuing mission to provide you with exceptional heart care, we have created designated Provider Care Teams. These Care Teams include your primary Cardiologist (physician) and Advanced Practice Providers (APPs- Physician Assistants and Nurse Practitioners) who all work together to provide you with the care you need, when you need it.   You may see any of the following providers on your designated Care Team at your next follow up: Marland Kitchen Dr Glori Bickers . Dr Loralie Champagne . Darrick Grinder, NP . Lyda Jester, PA . Audry Riles, PharmD   Please be sure to bring in all your medications bottles to every appointment.

## 2019-07-11 NOTE — Progress Notes (Signed)
HF Cardiology: Dr Aundra Dubin  Pulmonologist: Dr Melvyn Novas.  PCP: Abner Greenspan, MD  HPI: Ms. Kabala is a 78 y.o. 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   She had a 32 day-long admission (10/31-12/1/18) for acute hypoxic respiratory failure secondary to acute decompensated HF which was a new diagnosis at the time. She required intubation x2 and hospital course was complicated by MSSA pneumonia and RUE DVT.  LHC 06/16/17 with 95% stenosis of mid RCA and s/p DES. She was discharged to SNF on Entresto 49/51, Lasix 40 QD, spironolactone 25 QD, Plavix and Eliquis 5 BID. Weight 219 pounds at time of discharge.    Admitted 07/07/17 from SNF with presyncope and hypotension. HF meds held. Received IV fluids and antibiotics.   Echo in 3/20 showed EF 55-60% with basal inferior hypokinesis and normal RV size and systolic function.   She is slowly tapering down on prednisone (for BOOP), down to 2.5 mg daily.  She continues on home oxygen.  No dyspnea walking on flat ground as long as she wears oxygen. No orthopnea/PND. Main complaint is low back and hip pain.  Weight is stable.   ECG (personally reviewed): NSR, low voltage, poor RWP.   Labs (1/19): K 5.1, creatinine 1.1 Labs (2/19): K 4.5, creatinine 1.13, LDL 79, HDL 55, hgb 12.3 Labs (8/19): K 4.9, creatinine 1.26 Labs (12/19): K 4.6, creatinine 1.26 Labs (12/20): hgb 12.1, K 4.5, creatinine 1.3, LDL 28, TGs 190  PMH: 1. BOOP: Chronic prednisone, home oxygen.  2. Recurrent UTIs 3. HTN 4. LUE DVT 11/18 associated with PICC.  5. CAD:  - 2011 DES to LAD and RCA.  - LHC (11/18) with 95% mid RCA stenosis => DES.  6. Chronic systolic CHF: Ischemic cardiomyopathy.   - Echo (10/18) with EF 35-40%.  - Echo (2/19) with EF 40-45%, mild LVH, normal RV.  - Echo (3/20) with EF 55-60%, basal inferior  hypokinesis, normal RV.  7. GERD 8. Hyperlipidemia  ROS: All systems negative except as listed in HPI, PMH and Problem List.  Social History   Socioeconomic History  . Marital status: Married    Spouse name: Not on file  . Number of children: Not on file  . Years of education: Not on file  . Highest education level: Not on file  Occupational History  . Not on file  Tobacco Use  . Smoking status: Former Smoker    Packs/day: 1.00    Years: 25.00    Pack years: 25.00    Types: Cigarettes    Quit date: 07/27/1982    Years since quitting: 36.9  . Smokeless tobacco: Never Used  Substance and Sexual Activity  . Alcohol use: Yes    Alcohol/week: 0.0 standard drinks    Comment: wine-rare  . Drug use: No  . Sexual activity: Not Currently  Other Topics Concern  . Not on file  Social History Narrative  . Not on file   Social Determinants of Health   Financial Resource Strain:   . Difficulty of Paying Living Expenses: Not on file  Food Insecurity:   . Worried About Charity fundraiser in the Last Year: Not on file  . Ran Out of Food in the Last Year: Not on file  Transportation Needs:   . Lack of Transportation (Medical): Not on  file  . Lack of Transportation (Non-Medical): Not on file  Physical Activity:   . Days of Exercise per Week: Not on file  . Minutes of Exercise per Session: Not on file  Stress:   . Feeling of Stress : Not on file  Social Connections:   . Frequency of Communication with Friends and Family: Not on file  . Frequency of Social Gatherings with Friends and Family: Not on file  . Attends Religious Services: Not on file  . Active Member of Clubs or Organizations: Not on file  . Attends Archivist Meetings: Not on file  . Marital Status: Not on file  Intimate Partner Violence:   . Fear of Current or Ex-Partner: Not on file  . Emotionally Abused: Not on file  . Physically Abused: Not on file  . Sexually Abused: Not on file   Family History   Problem Relation Age of Onset  . Stroke Mother   . Diabetes Mother 104  . Stroke Father   . Heart failure Father   . Breast cancer Maternal Grandmother   . Colon cancer Neg Hx     Current Outpatient Medications  Medication Sig Dispense Refill  . acetaminophen (TYLENOL) 325 MG tablet Take 650 mg by mouth every 6 (six) hours as needed for mild pain. Take as needed per bottle     . albuterol (PROAIR HFA) 108 (90 Base) MCG/ACT inhaler INHALE 2 PUFFS BY MOUTH EVERY 4 HOURS AS NEEDED FOR WHEEZING OR FOR SHORTNESS OF BREATH 8 Inhaler 5  . aspirin 81 MG tablet Take 81 mg by mouth daily.    . bisoprolol (ZEBETA) 5 MG tablet TAKE 1.5 TABLETS (7.5 MG TOTAL) BY MOUTH DAILY. 135 tablet 2  . chlorpheniramine (CHLOR-TRIMETON) 4 MG tablet Take 4 mg by mouth 2 (two) times daily as needed for allergies.    Marland Kitchen clopidogrel (PLAVIX) 75 MG tablet Take 1 tablet (75 mg total) by mouth daily. 90 tablet 3  . cyanocobalamin (,VITAMIN B-12,) 1000 MCG/ML injection Inject 1,000 mcg into the muscle every 3 (three) months.    . ENTRESTO 97-103 MG TAKE 1 TABLET BY MOUTH TWICE A DAY 60 tablet 6  . EPINEPHrine (EPIPEN 2-PAK) 0.3 mg/0.3 mL IJ SOAJ injection INJECT 0.3 MLS (0.3 MG TOTAL) INTO THE MUSCLE ONCE AS NEEDED FOR ALLERGIC REACTION 2 Device 1  . erythromycin ophthalmic ointment USE SMALL AMOUNT TO AFFECTED EYE AS NEEDED    . esomeprazole (NEXIUM) 40 MG capsule Take 1 capsule (40 mg total) by mouth daily. 90 capsule 3  . Evolocumab (REPATHA SURECLICK) [id number] MG/ML SOAJ Inject 1 Dose into the skin every 14 (fourteen) days. 2 pen 11  . ezetimibe (ZETIA) 10 MG tablet TAKE 1 TABLET (10 MG TOTAL) BY MOUTH DAILY. 90 tablet 3  . famotidine (PEPCID) 20 MG tablet Take 40 mg by mouth daily.    . furosemide (LASIX) 20 MG tablet Take 20 mg by mouth every other day.    Marland Kitchen LANTUS 100 UNIT/ML injection INJECT 22 UNITS INTO THE SKIN DAILY. (VIAL EXPIRES 28 DAYS AFTER FIRST USE) 30 mL 1  . methocarbamol (ROBAXIN) 500 MG tablet Take 1  tablet (500 mg total) by mouth every 8 (eight) hours as needed for muscle spasms. 90 tablet 0  . mometasone (ELOCON) 0.1 % cream Apply 1 application topically daily. 15 g 3  . OXYGEN 24/7 2 lpm  [employer]    . predniSONE (DELTASONE) 5 MG tablet Take 2.5 mg by mouth daily with breakfast.    .  spironolactone (ALDACTONE) 25 MG tablet TAKE 1/2 TABLET BY MOUTH DAILY 45 tablet 1   No current facility-administered medications for this encounter.    Vitals:   07/11/19 1038  BP: (!) 138/57  Pulse: 65  SpO2: 98%  Weight: 112.1 kg (247 lb 3.2 oz)    PHYSICAL EXAM: General: NAD Neck: Thick, no JVD, no thyromegaly or thyroid nodule.  Lungs: Mild crackles at bases. CV: Nondisplaced PMI.  Heart regular S1/S2, no S3/S4, no murmur.  No peripheral edema.  No carotid bruit.  Normal pedal pulses.  Abdomen: Soft, nontender, no hepatosplenomegaly, no distention.  Skin: Intact without lesions or rashes.  Neurologic: Alert and oriented x 3.  Psych: Normal affect. Extremities: No clubbing or cyanosis.  HEENT: Normal.   ASSESSMENT & PLAN: 1. Chronic systolic CHF: Ischemic cardiomyopathy.  Echo in 3/20 showed EF up to 55-60%.  NYHA class II symptoms, chronic.  She is not volume overloaded.  Much of residual dyspnea is likely due to chronic BOOP and deconditioning.   - Continue spironolactone 12.5 daily.   - Continue Entresto 97/103 bid.  BMET today. - Continue bisoprolol 7.5 mg daily.   - Continue Lasix 20 mg qod.  - She is currently out of ICD range.  - Repeat echo in 3/21.  2. CAD: Most recently had DES to [facility] in 11/18.  No chest pain.   - Continue ASA 81 and Plavix 75.   - She cannot tolerate statins, now on Repatha and Zetia.  Good lipids in 12/20.      3. 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  - I will arrange for pulmonary rehab.  4. RUE DVT: In 11/18, related to PICC.  Now off Eliquis. 5.d. Deconditioning: I will refer her to pulmonary rehab.   Followup in 4 months.   Loralie Champagne 07/11/2019

## 2019-07-13 ENCOUNTER — Other Ambulatory Visit (HOSPITAL_COMMUNITY): Payer: Self-pay | Admitting: Cardiology

## 2019-07-15 ENCOUNTER — Other Ambulatory Visit (HOSPITAL_COMMUNITY): Payer: Self-pay | Admitting: Cardiology

## 2019-07-16 DIAGNOSIS — R0902 Hypoxemia: Secondary | ICD-10-CM | POA: Diagnosis not present

## 2019-07-17 ENCOUNTER — Other Ambulatory Visit: Payer: Self-pay

## 2019-07-17 ENCOUNTER — Encounter: Payer: Self-pay | Admitting: Family Medicine

## 2019-07-17 ENCOUNTER — Ambulatory Visit (INDEPENDENT_AMBULATORY_CARE_PROVIDER_SITE_OTHER): Payer: Medicare Other | Admitting: Family Medicine

## 2019-07-17 VITALS — BP 128/74 | HR 70 | Temp 97.3°F | Wt 237.4 lb

## 2019-07-17 DIAGNOSIS — E1169 Type 2 diabetes mellitus with other specified complication: Secondary | ICD-10-CM | POA: Diagnosis not present

## 2019-07-17 DIAGNOSIS — Z0000 Encounter for general adult medical examination without abnormal findings: Secondary | ICD-10-CM

## 2019-07-17 DIAGNOSIS — E669 Obesity, unspecified: Secondary | ICD-10-CM

## 2019-07-17 DIAGNOSIS — E538 Deficiency of other specified B group vitamins: Secondary | ICD-10-CM | POA: Diagnosis not present

## 2019-07-17 DIAGNOSIS — I5042 Chronic combined systolic (congestive) and diastolic (congestive) heart failure: Secondary | ICD-10-CM

## 2019-07-17 DIAGNOSIS — I10 Essential (primary) hypertension: Secondary | ICD-10-CM

## 2019-07-17 DIAGNOSIS — N289 Disorder of kidney and ureter, unspecified: Secondary | ICD-10-CM | POA: Insufficient documentation

## 2019-07-17 DIAGNOSIS — M8589 Other specified disorders of bone density and structure, multiple sites: Secondary | ICD-10-CM

## 2019-07-17 DIAGNOSIS — J9612 Chronic respiratory failure with hypercapnia: Secondary | ICD-10-CM

## 2019-07-17 DIAGNOSIS — E785 Hyperlipidemia, unspecified: Secondary | ICD-10-CM

## 2019-07-17 DIAGNOSIS — J9611 Chronic respiratory failure with hypoxia: Secondary | ICD-10-CM

## 2019-07-17 MED ORDER — INSULIN GLARGINE 100 UNIT/ML ~~LOC~~ SOLN: SUBCUTANEOUS | 3 refills | Status: DC

## 2019-07-17 MED ORDER — SPIRONOLACTONE 25 MG PO TABS: 12.5000 mg | ORAL_TABLET | Freq: Every day | ORAL | 3 refills | Status: DC

## 2019-07-17 MED ORDER — EZETIMIBE 10 MG PO TABS: ORAL_TABLET | ORAL | 3 refills | Status: DC

## 2019-07-17 MED ORDER — ESOMEPRAZOLE MAGNESIUM 40 MG PO CPDR: 40.0000 mg | DELAYED_RELEASE_CAPSULE | Freq: Every day | ORAL | 3 refills | Status: DC

## 2019-07-17 MED ORDER — CYANOCOBALAMIN 1000 MCG/ML IJ SOLN
1000.0000 ug | Freq: Once | INTRAMUSCULAR | Status: AC
  Administered 2019-07-17: 1000 ug via INTRAMUSCULAR

## 2019-07-17 NOTE — Assessment & Plan Note (Signed)
Due for shot Given today Lab Results  Component Value Date   VITAMINB12 522 07/10/2019

## 2019-07-17 NOTE — Assessment & Plan Note (Addendum)
dexa 9/18  One fall with fx this year  Disc imp of vit D  Enc to continue chair exercise as tolerated  Will consider f/u dexa once pandemic is calmed down  Cannot take alendronate due to GI issues  She is apprehensive to consider other medicines Increased fx risk from prednisone-she is aware

## 2019-07-17 NOTE — Assessment & Plan Note (Signed)
Reviewed health habits including diet and exercise and skin cancer prevention Reviewed appropriate screening tests for age  Also reviewed health mt list, fam hx and immunization status , as well as social and family history   See HPI Labs reviewed  amw per pt done by her insurance/ declines it here Enc her strongly to consider a mammogram  May consider dexa when pandemic slows down  Cannot get shingrix vaccine with chronic prednisone  B12 shot given today

## 2019-07-17 NOTE — Assessment & Plan Note (Signed)
Cr 1.31 She is limited in fluid intake due to CHF  Taking entresto

## 2019-07-17 NOTE — Assessment & Plan Note (Signed)
bp in fair control at this time  BP Readings from Last 1 Encounters:  07/17/19 128/74   No changes needed Most recent labs reviewed  Disc lifstyle change with low sodium diet and exercise

## 2019-07-17 NOTE — Assessment & Plan Note (Signed)
Lab Results  Component Value Date   HGBA1C 5.5 07/10/2019   Well controlled with lantus No hypoglycemia  Will continue to follow as she weans prednisone further in the future  On arb  Cannot take statins  DM eye exam planned/ref done

## 2019-07-17 NOTE — Assessment & Plan Note (Signed)
Disc goals for lipids and reasons to control them Rev last labs with pt Rev low sat fat diet in detail LDL is very well controlled with Repatha

## 2019-07-17 NOTE — Assessment & Plan Note (Signed)
Clinically stable entresto has been helpful  utd with cardiology f/u

## 2019-07-17 NOTE — Assessment & Plan Note (Signed)
Discussed how this problem influences overall health and the risks it imposes  Reviewed plan for weight loss with lower calorie diet (via better food choices and also portion control or program like weight watchers) and exercise building up to or more than 30 minutes 5 days per week including some aerobic activity    Enc her to do her old PT and chair exercise

## 2019-07-17 NOTE — Progress Notes (Signed)
Subjective:    Patient ID: Kelli Velasquez, female    DOB: [birth year], 78 y.o.   MRN: [id number]  This visit occurred during the SARS-CoV-2 public health emergency.  Safety protocols were in place, including screening questions prior to the visit, additional usage of staff PPE, and extensive cleaning of exam room while observing appropriate contact time as indicated for disinfecting solutions.    HPI Here for health maintenance exam and to review chronic medical problems    In general doing ok / some gradual improvement  Is still weak in the am   She had amw from insurance co at home recently and did not want to repeat t here   Mammogram 9/18- decided to skip one last year  Unsure if she could stand at the machine  Self breast exam -no lumps or changes   Td 12/06- will update if she gets a wound   pna vaccines are utd Flu vaccine given 10/20  Zoster status -on prednisone cannot get shingrix vaccine   dexa 9/18 -osteopenia  Falls - had a fall getting up too fast and lost balance Fractures - arm fx this year -wrist  Supplements-no longer on vitamin D  Exercise -unable to do very much (does a little with her home care)  Will be able to do some outpt PT when the pandemic is improved  Per pt cannot take prolia due to her fish allergy /(shellfish and fish)   Weight- cannot weigh today due to wheelchair status  Last weight at home on 12/20 was 237.4   bp is stable today  No cp or palpitations or headaches or edema  No side effects to medicines  BP Readings from Last 3 Encounters:  07/17/19 128/74  07/11/19 (!) 138/57  06/27/19 127/63     Pt has CAD and also chronic combined CHF  Lab Results  Component Value Date   CREATININE 1.31 (H) 07/10/2019   BUN 26 (H) 07/10/2019   NA 142 07/10/2019   K 4.5 07/10/2019   CL 103 07/10/2019   CO2 28 07/10/2019  cr remains elevated On entresto  Is limited to under 2 L per day by cardiology    Lab Results  Component Value Date     ALT 17 07/10/2019   AST 15 07/10/2019   ALKPHOS 61 07/10/2019   BILITOT 0.3 07/10/2019     Cough variant asthma vs UACS and chronic resp failure  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   Diabetes 2  Lab Results  Component Value Date   HGBA1C 5.5 07/10/2019   Well controlled  90s-120s generally,   Will spike up to as high as 170 after sweets  Does not eat sweets as a rule  Trying hard to eat healthy  In the past related to prednisone  On lantus right now  No episodes of low glucose  Still on prednisone 2.5 mg daily --- next step is to taper to every other day and then stop   No statin (intol)-is on pcyk9 inhibitor Is on arb   Eye exam- 3/19 was last one  She is ready to schedule one    Hyperlipidemia  Lab Results  Component Value Date   CHOL 116 07/10/2019   CHOL 109 06/27/2019   CHOL 114 09/30/2018   Lab Results  Component Value Date   HDL 50.70 07/10/2019   HDL 45 06/27/2019   HDL 47 09/30/2018   Lab Results  Component Value Date  LDLCALC 28 07/10/2019   LDLCALC 32 06/27/2019   LDLCALC 27 09/30/2018   Lab Results  Component Value Date   TRIG 190.0 (H) 07/10/2019   TRIG 203 (H) 06/27/2019   TRIG 201 (H) 09/30/2018   Lab Results  Component Value Date   CHOLHDL 2 07/10/2019   CHOLHDL 2.4 06/27/2019   CHOLHDL 2.4 09/30/2018   Lab Results  Component Value Date   LDLDIRECT 121.0 07/06/2018   LDLDIRECT 124.8 09/01/2007   LDLDIRECT 132.2 04/11/2007   On repatha  Overall well and stable   H/o low B12 Lab Results  Component Value Date   VITAMINB12 522 07/10/2019    Lab Results  Component Value Date   WBC 11.2 (H) 07/10/2019   HGB 12.1 07/10/2019   HCT 37.7 07/10/2019   MCV 99.7 07/10/2019   PLT 264.0 07/10/2019  still on low dose prednisone   Lab Results  Component Value Date   TSH 2.53 07/10/2019     Patient Active Problem List   Diagnosis Date Noted  . Renal insufficiency 07/17/2019  . History of arm fracture  03/21/2019  . Low back pain 08/23/2018  . Eczema 07/08/2018  . Hoarseness of voice 01/03/2018  . Coronary artery disease 07/07/2017  . Chronic systolic heart failure ([place]) 07/07/2017  . Abnormal urinalysis 07/07/2017  . History of DVT (deep vein thrombosis) 07/07/2017  . Diabetes mellitus type 2 in obese ([place]) 07/07/2017  . 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 Hypoxemia   . Encounter for screening mammogram for breast cancer 02/02/2017  . Chronic combined systolic and diastolic CHF, NYHA class 2 ([place]) 02/02/2017  . Depressed mood 12/02/2016  . Routine general medical examination at a health care facility 12/30/2015  . Supraumbilical hernia [id number]  . Lump in the abdomen 04/10/2015  . GERD (gastroesophageal reflux disease) 10/10/2014  . B12 deficiency 10/09/2014  . Cataracts, bilateral 10/09/2014  . Dry eyes 10/09/2014  . Fatigue 05/08/2014  . Pedal edema 05/08/2014  . Osteopenia 11/28/2013  . Estrogen deficiency 11/02/2013  . Dyspnea on exertion 06/27/2013  . Hernia, incisional 04/03/2013  . 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 Cough 12/18/2012  . Chronic respiratory failure with hypoxia and hypercapnia ([place]) 10/26/2012  . Special screening for malignant neoplasms, colon 06/16/2011  . CAD, NATIVE VESSEL 08/19/2009  . 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 Morbid obesity due to excess calories ([place]) comp by hbp/ hyperlipidemia/ dm/ihd 09/01/2007  . MYOPIA 03/03/2007  . Hyperlipidemia associated with type 2 diabetes mellitus ([place]) 10/15/2006  . Essential hypertension 10/15/2006  . Allergic rhinitis 10/15/2006  . Cough variant asthma vs UACS  10/15/2006  . OVERACTIVE BLADDER 10/15/2006  . OSTEOARTHRITIS 10/15/2006  . Urinary incontinence 10/15/2006   Past Medical History:  Diagnosis Date  . Allergy    allergic rhinitis  . Asthma   . Chronic bronchitis ([place])   . Coronary artery disease    cath  January 2011 with DEs LAD and RCA  . Dyspnea   . Full dentures   . Gallstones   . GERD (gastroesophageal reflux disease)   . History of echocardiogram    Echo 5/17: mod LVH, EF 50-55%, ant-septal HK, Gr 1 DD, mod LAE //  b.  Echo 7/17: mild concentric LVH, EF 45-50%, inf-lat, inf, inf-septal HK, mild LAE  . History of nuclear stress test    Myoview 7/17: EF 48%, small mild apical defect, no ischemia, low risk  . Hyperlipidemia   . Hypertension   .  Myocardial infarction ([place])    subendocardial, initial episode, 2010 two stents placed  . Myopia   . 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 Nocturnal oxygen desaturation    o2 at night  . Obesity   . Osteoarthritis    knees, fingers, shoulders  . Overactive bladder   . Oxygen deficiency    uses oxygen all day  . Rash    and other non specific skin eruptions  . Retaining fluid    in ankles and feet  . Urinary incontinence   . Vertigo   . Wears glasses    Past Surgical History:  Procedure Laterality Date  . CATARACT EXTRACTION W/PHACO Right 12/16/2016   Procedure: CATARACT EXTRACTION PHACO AND INTRAOCULAR LENS PLACEMENT ([place])  right;  Surgeon: Leandrew Koyanagi, MD;  Location: [facility];  Service: Ophthalmology;  Laterality: Right;  . CATARACT EXTRACTION W/PHACO Left 02/03/2017   Procedure: CATARACT EXTRACTION PHACO AND INTRAOCULAR LENS PLACEMENT ([place])  Left  Complicated;  Surgeon: Leandrew Koyanagi, MD;  Location: [facility];  Service: Ophthalmology;  Laterality: Left;  [place] Uses oxygen   . CHOLECYSTECTOMY  92  . COLONOSCOPY    . CORONARY ANGIOGRAPHY N/A 06/16/2017   Procedure: CORONARY ANGIOGRAPHY;  Surgeon: Larey Dresser, MD;  Location: [facility];  Service: Cardiovascular;  Laterality: N/A;  . CORONARY ANGIOPLASTY WITH STENT PLACEMENT  07/2009   stent  . CORONARY STENT INTERVENTION N/A 06/16/2017   Procedure: CORONARY STENT INTERVENTION;  Surgeon: Wellington Hampshire, MD;  Location: [facility];  Service: Cardiovascular;  Laterality: N/A;  . DILATION AND CURETTAGE OF UTERUS  1984  . INCISIONAL HERNIA REPAIR N/A 05/16/2013   Procedure: HERNIA REPAIR INFRAUMILICAL INCISIONAL;  Surgeon: Joyice Faster. Cornett, MD;  Location: [facility];  Service: General;  Laterality: N/A;  umbilical  . INSERTION OF MESH N/A 05/16/2013   Procedure: INSERTION OF MESH;  Surgeon: Joyice Faster. Cornett, MD;  Location: [facility];  Service: General;  Laterality: N/A;  umbilical  . JOINT REPLACEMENT  2007   right total knee replacement  . RIGHT/LEFT HEART CATH AND CORONARY ANGIOGRAPHY N/A 06/16/2017   Procedure: RIGHT/LEFT HEART CATH AND CORONARY ANGIOGRAPHY;  Surgeon: Larey Dresser, MD;  Location: [facility];  Service: Cardiovascular;  Laterality: N/A;  . TOTAL KNEE ARTHROPLASTY  2010   left , then vocal cord infection post op  . VIDEO BRONCHOSCOPY Bilateral 07/05/2013   Procedure: VIDEO BRONCHOSCOPY WITH FLUORO;  Surgeon: Tanda Rockers, MD;  Location: [facility];  Service: Cardiopulmonary;  Laterality: Bilateral;  . vocal cord polypectomy     Social History   Tobacco Use  . Smoking status: Former Smoker    Packs/day: 1.00    Years: 25.00    Pack years: 25.00    Types: Cigarettes    Quit date: 07/27/1982    Years since quitting: 36.9  . Smokeless tobacco: Never Used  Substance Use Topics  . Alcohol use: Yes    Alcohol/week: 0.0 standard drinks    Comment: wine-rare  . Drug use: No   Family History  Problem Relation Age of Onset  . Stroke Mother   . Diabetes Mother 12  . Stroke Father   . Heart failure Father   . Breast cancer Maternal Grandmother   . Colon cancer Neg Hx    Allergies  Allergen Reactions  . Fish Allergy Anaphylaxis  . Shellfish Allergy Anaphylaxis  .  Aspirin     REACTION: nausea and vomiting High doses  . Atorvastatin     REACTION: leg pain  . Crestor [Rosuvastatin Calcium] Other (See Comments)     Muscle pain - allergy/intolerance  . Penicillins     Due to mold allergy per pt  . Simvastatin     REACTION: muscle pain  . Trandolapril     REACTION: leg pain   Current Outpatient Medications on File Prior to Visit  Medication Sig Dispense Refill  . acetaminophen (TYLENOL) 325 MG tablet Take 650 mg by mouth every 6 (six) hours as needed for mild pain. Take as needed per bottle     . albuterol (PROAIR HFA) 108 (90 Base) MCG/ACT inhaler INHALE 2 PUFFS BY MOUTH EVERY 4 HOURS AS NEEDED FOR WHEEZING OR FOR SHORTNESS OF BREATH 8 Inhaler 5  . aspirin 81 MG tablet Take 81 mg by mouth daily.    . bisoprolol (ZEBETA) 5 MG tablet TAKE 1.5 TABLETS (7.5 MG TOTAL) BY MOUTH DAILY. 135 tablet 2  . chlorpheniramine (CHLOR-TRIMETON) 4 MG tablet Take 4 mg by mouth 2 (two) times daily as needed for allergies.    Marland Kitchen clopidogrel (PLAVIX) 75 MG tablet Take 1 tablet (75 mg total) by mouth daily. 90 tablet 3  . cyanocobalamin (,VITAMIN B-12,) 1000 MCG/ML injection Inject 1,000 mcg into the muscle every 3 (three) months.    . EPINEPHrine (EPIPEN 2-PAK) 0.3 mg/0.3 mL IJ SOAJ injection INJECT 0.3 MLS (0.3 MG TOTAL) INTO THE MUSCLE ONCE AS NEEDED FOR ALLERGIC REACTION 2 Device 1  . erythromycin ophthalmic ointment USE SMALL AMOUNT TO AFFECTED EYE AS NEEDED    . Evolocumab (REPATHA SURECLICK) [id number] MG/ML SOAJ Inject 1 Dose into the skin every 14 (fourteen) days. 2 pen 11  . famotidine (PEPCID) 20 MG tablet Take 40 mg by mouth daily.    . furosemide (LASIX) 20 MG tablet Take 1 tablet (20 mg total) by mouth every other day. 45 tablet 0  . furosemide (LASIX) 20 MG tablet Take 20 mg by mouth every other day.    . methocarbamol (ROBAXIN) 500 MG tablet Take 1 tablet (500 mg total) by mouth every 8 (eight) hours as needed for muscle spasms. 90 tablet 0  . mometasone (ELOCON) 0.1 % cream Apply 1 application topically daily. 15 g 3  . OXYGEN 24/7 2 lpm  [employer]    . predniSONE (DELTASONE) 5 MG tablet Take 2.5 mg by mouth daily  with breakfast.    . ENTRESTO 97-103 MG TAKE 1 TABLET BY MOUTH TWICE A DAY 60 tablet 6   No current facility-administered medications on file prior to visit.    Review of Systems  Constitutional: Negative for activity change, appetite change, fatigue, fever and unexpected weight change.  HENT: Negative for congestion, ear pain, rhinorrhea, sinus pressure and sore throat.   Eyes: Negative for pain, redness and visual disturbance.  Respiratory: Positive for shortness of breath. Negative for cough and wheezing.   Cardiovascular: Negative for chest pain and palpitations.  Gastrointestinal: Negative for abdominal pain, blood in stool, constipation and diarrhea.  Endocrine: Negative for polydipsia and polyuria.  Genitourinary: Negative for dysuria, frequency and urgency.  Musculoskeletal: Positive for arthralgias and back pain. Negative for myalgias.       Back pain limits her activity    Skin: Negative for pallor and rash.  Allergic/Immunologic: Negative for environmental allergies.  Neurological: Negative for dizziness, syncope and headaches.  Hematological: Negative for adenopathy. Does not bruise/bleed easily.  Psychiatric/Behavioral:  Negative for decreased concentration and dysphoric mood. The patient is not nervous/anxious.        Objective:   Physical Exam Constitutional:      General: She is not in acute distress.    Appearance: Normal appearance. She is well-developed. She is obese. She is not ill-appearing or diaphoretic.  HENT:     Head: Normocephalic and atraumatic.     Right Ear: Tympanic membrane, ear canal and external ear normal.     Left Ear: Tympanic membrane, ear canal and external ear normal.     Nose: Nose normal. No congestion.     Mouth/Throat:     Mouth: Mucous membranes are moist.     Pharynx: Oropharynx is clear. No posterior oropharyngeal erythema.  Eyes:     General: No scleral icterus.    Extraocular Movements: Extraocular movements intact.      Conjunctiva/sclera: Conjunctivae normal.     Pupils: Pupils are equal, round, and reactive to light.  Neck:     Thyroid: No thyromegaly.     Vascular: No carotid bruit or JVD.  Cardiovascular:     Rate and Rhythm: Normal rate and regular rhythm.     Pulses: Normal pulses.     Heart sounds: Normal heart sounds. No gallop.   Pulmonary:     Effort: Pulmonary effort is normal. No respiratory distress.     Breath sounds: Normal breath sounds. No wheezing.     Comments: Good air exch Chest:     Chest wall: No tenderness.  Abdominal:     General: Bowel sounds are normal. There is no distension or abdominal bruit.     Palpations: Abdomen is soft. There is no mass.     Tenderness: There is no abdominal tenderness.     Hernia: No hernia is present.  Genitourinary:    Comments: Pt declines breast exam   Musculoskeletal:        General: No tenderness. Normal range of motion.     Cervical back: Normal range of motion and neck supple. No rigidity. No muscular tenderness.     Right lower leg: No edema.     Left lower leg: No edema.     Comments: No kyphosis   Lymphadenopathy:     Cervical: No cervical adenopathy.  Skin:    General: Skin is warm and dry.     Coloration: Skin is not pale.     Findings: No erythema or rash.     Comments: Solar lentigines diffusely Also scattered SKS  Neurological:     Mental Status: She is alert. Mental status is at baseline.     Cranial Nerves: No cranial nerve deficit.     Motor: No abnormal muscle tone.     Coordination: Coordination normal.     Gait: Gait normal.     Deep Tendon Reflexes: Reflexes are normal and symmetric. Reflexes normal.     Comments: In wheelchair Gait not observed   Psychiatric:        Mood and Affect: Mood normal.        Cognition and Memory: Cognition and memory normal.           Assessment & Plan:   Problem List Items Addressed This Visit      Cardiovascular and Mediastinum   Essential hypertension    bp in fair  control at this time  BP Readings from Last 1 Encounters:  07/17/19 128/74   No changes needed Most recent labs reviewed  Disc lifstyle change  with low sodium diet and exercise        Relevant Medications   ezetimibe (ZETIA) 10 MG tablet   spironolactone (ALDACTONE) 25 MG tablet   Chronic combined systolic and diastolic CHF, NYHA class 2 ([facility])    Clinically stable entresto has been helpful  utd with cardiology f/u         Relevant Medications   ezetimibe (ZETIA) 10 MG tablet   spironolactone (ALDACTONE) 25 MG tablet     Respiratory   Chronic respiratory failure with hypoxia and hypercapnia ([facility])    Per pt- slowly improving Still 02 dependent  On prednisone 2.5 mg daily  Will continue to wean         Endocrine   Hyperlipidemia associated with type 2 diabetes mellitus ([place])    Disc goals for lipids and reasons to control them Rev last labs with pt Rev low sat fat diet in detail LDL is very well controlled with Repatha       Relevant Medications   insulin glargine (LANTUS) 100 UNIT/ML injection   ezetimibe (ZETIA) 10 MG tablet   Diabetes mellitus type 2 in obese [facility])    Lab Results  Component Value Date   HGBA1C 5.5 07/10/2019   Well controlled with lantus No hypoglycemia  Will continue to follow as she weans prednisone further in the future  On arb  Cannot take statins  DM eye exam planned/ref done       Relevant Medications   insulin glargine (LANTUS) 100 UNIT/ML injection   Other Relevant Orders   Ambulatory referral to Ophthalmology     Musculoskeletal and Integument   Osteopenia    dexa 9/18  One fall with fx this year  Disc imp of vit D  Enc to continue chair exercise as tolerated  Will consider f/u dexa once pandemic is calmed down  Cannot take alendronate due to GI issues  She is apprehensive to consider other medicines Increased fx risk from prednisone-she is aware         Genitourinary   Renal insufficiency    Cr 1.31 She is  limited in fluid intake due to CHF  Taking entresto          Other   Morbid obesity due to excess calories ([place]) comp by hbp/ hyperlipidemia/ dm/ihd    Discussed how this problem influences overall health and the risks it imposes  Reviewed plan for weight loss with lower calorie diet (via better food choices and also portion control or program like weight watchers) and exercise building up to or more than 30 minutes 5 days per week including some aerobic activity    Enc her to do her old PT and chair exercise      Relevant Medications   insulin glargine (LANTUS) 100 UNIT/ML injection   B12 deficiency    Due for shot Given today Lab Results  Component Value Date   VITAMINB12 522 07/10/2019          Routine general medical examination at a health care facility - Primary    Reviewed health habits including diet and exercise and skin cancer prevention Reviewed appropriate screening tests for age  Also reviewed health mt list, fam hx and immunization status , as well as social and family history   See HPI Labs reviewed  amw per pt done by her insurance/ declines it here Enc her strongly to consider a mammogram  May consider dexa when pandemic slows down  Cannot get shingrix vaccine with  chronic prednisone  B12 shot given today

## 2019-07-17 NOTE — Assessment & Plan Note (Signed)
Per pt- slowly improving Still 02 dependent  On prednisone 2.5 mg daily  Will continue to wean

## 2019-07-17 NOTE — Patient Instructions (Addendum)
Let us know if /when you feel up to scheduling a mammogram   Get back on calcium and vitamin D  Try to get 1200-1500 mg of calcium per day with at least 1000 iu of vitamin D - for bone health   If you develop low blood glucose readings let us know and we will scale back on the lantus   Our office will call you to set up a diabetic eye exam

## 2019-07-31 ENCOUNTER — Other Ambulatory Visit: Payer: Self-pay

## 2019-07-31 ENCOUNTER — Encounter: Payer: Self-pay | Admitting: Internal Medicine

## 2019-07-31 ENCOUNTER — Ambulatory Visit: Payer: Medicare Other | Admitting: Internal Medicine

## 2019-07-31 DIAGNOSIS — J9612 Chronic respiratory failure with hypercapnia: Secondary | ICD-10-CM | POA: Diagnosis not present

## 2019-07-31 DIAGNOSIS — J9611 Chronic respiratory failure with hypoxia: Secondary | ICD-10-CM

## 2019-07-31 DIAGNOSIS — R918 Other nonspecific abnormal finding of lung field: Secondary | ICD-10-CM

## 2019-07-31 NOTE — Patient Instructions (Signed)
Make sure you check your oxygen saturations at highest level of activity to be sure it stays over 90% and adjust upward to maintain this level if needed but remember to turn it back to previous settings when you stop (to conserve your supply)  Drop off every 3rd dose of prednisone for month then once ever other day for a month, then take once every 3rd day for a month and stop  - resume previous dose if worse cough, shortness of breath or nausea    Please schedule a follow up visit in 6  months but call sooner if needed

## 2019-07-31 NOTE — Progress Notes (Signed)
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    History of Present Illness  07/03/2013 f/u ov/Sritha Chauncey re: sob/cough/ wheeze/ MPN Chief Complaint  Patient presents with  . Follow-up    Pt had PET scan done this am. Her cough and SOB have improved since the last visit.   saba inhaler helps some/ min mucoid sputum, temp to 99 but no rigors, no purulent sputum or hemoptysis/ does have some wt. Loss, no sweating.  rec Dulera 100 Take 2 puffs first thing in am and then another 2 puffs about 12 hours later.  Prednisone 10 mg take  4 each am x 2 days,   2 each am x 2 days,  1 each am x 2 days and stop Only use your albuterol as a rescue medication  We will arrange 24/7 02 at 3lpm per advanced  We will call to arrange a biopsy of the easiest area once I discuss this with radiology  Late add: Discussed in detail all the  indications, usual  risks and alternatives  relative to the benefits with patient who agrees to proceed with bronchoscopy with biopsy.   FOB > done 07/05/2013 > nl airways > neg afb/fungus/path  - IR Bx 07/11/2013 > inflammatory, special stains pending but does not look like tb or fungus and responds short course pred per pt > rec prednisone x 6 days only and needs VATS bx but declines as of 07/13/2013 > rec'd final path from [place] review > c/w BOOP though can't exclude asp/infection sources        11/20/2016 acute extended ov/Vandy Tsuchiya re:  BOOP/ pred 20/15 alternating and 2lpm 24/7 and increased to 3lpm to control Chief Complaint  Patient presents with  . Acute Visit    Pt c/o increased SOB, cough, and fatigue for the past 1-2 wks. Her cough is  non prod. She states she wakes up in the am very tired.    gradually sob /cough worse p eating / eats 10 pm, no candy handy and having lots of throat clearing/ sensation of daytime pnds / no better with saba  Doe now = MMRC3 = can't walk 100 yards even at a slow pace at a flat grade s stopping due to sob   rec Please remember to go to the lab and x-ray department downstairs in the basement  for your tests - we will call you with the results when they are available.  For drainage / throat tickle try take CHLORPHENIRAMINE  4 mg - take one every 4 hours as needed GERD  Keep your previous appt  Add :  Consider challenging with symbicort to see if helps cough/ systemic steroid dep    01/11/2017  f/u ov/Sumedh Shinsato re:  BOOP/ steroid dep  -  No med calendar  Chief Complaint  Patient presents with  . Follow-up    3 month follow up. Patient states she is not feeling well. Feeling congested. Coughing. States she is not able to walk 53f on 2L of o2 before feeling out of breath.   gradually tapered pred from 20/15  Started feeling worse  when reduced pred  to 5 mg daily x 3 weeks prior to OV  And weak since. Bad chills x 2 x  1-2 days prior to OV  But s excess/ purulent sputum or mucus plugs  And none in last 24h rec Please remember to go to the lab and x-ray department downstairs in the basement  for your tests - we will call you with the results when they are available. Prednisone 10 mg daily until better or otherwise notified and then when better 10/5 alternating days     Admit date: 07/07/2017 Discharge date: 07/10/2017  Recommendations for Outpatient Follow-up:  Please note that lasix dose is now 20 mg a day due to soft blood pressure in hospital. Follow up with cardio per sch appt to make sure this dose is tolerated. Aldactone is 12.5 mg a day. Continue Cipro for 4 days on discharge for UTI.   Discharge Diagnoses:    Near syncope   Acute kidney injury [facility])   Coronary artery disease    Hypertension   Acute on chronic respiratory failure with hypoxia (HCC)   Chronic systolic heart failure (HCC)   Abnormal urinalysis   Dysphagia   Acute deep vein thrombosis (DVT) of right upper extremity (HCC)   Diabetes mellitus type 2 in obese (HCC)   Acute metabolic encephalopathy   Gluteal cleft wound    08/16/2017  Extended post hosp transition of care  f/u ov/Rafay Dahan re:  Re-establish re BOOP steroid dep/ pred 10 mg daily plus new dx ischemic chf  Chief Complaint  Patient presents with  . Hospitalization Follow-up    Breathing is worse today which she relates to colder weather. She has not had to use her rescue inhaler.   doe = MMRC3 = can't walk 100 yards even at a slow pace at a flat grade s stopping due to sob  On up to 3lpm  Sleeping fine/ hoarse but not coughing. rec Try prednisone 10 mg on even days and 5 mg on odd if tolerated        02/14/2018  f/u ov/Breeona Waid re: boop on pred 10 -5 -5  Chief Complaint  Patient presents with  . Follow-up    Breathing is unchanged since the last visit. She rarely uses her albuterol.   Dyspnea:  MMRC3 = can't walk 100 yards even at a slow pace at a flat grade s stopping due to sob  eg has to stop when shopping at [facility] or [employer]/ water aerobics x 1 hour 3 x per week  Cough: not much    SABA use: rare 02: 2lpm at home, 3 pulsed when out  rec Go ahead and reduce to Predisone  5 mg daily  X 2 weeks then 5/2.5 mg alternative days and leave there  Please remember to go to the lab and x-ray department downstairs in the basement  for your tests - we will call you with the results when they are available.   Please schedule a follow up visit in 3 months but call sooner if needed      05/17/2018  f/u ov/Rhylee Nunn re:   Boop/ steroid resp/? Dependent / never saw rheum on 5 mg pred daily  Chief Complaint  Patient presents with  . Follow-up    States her breathing has been up down, but albuterol has helped.    Dyspnea:  Chair bicycle 3 x wk x 30 min s  stopping on 2lpm but does not check sats with ex as rec  Cough: none Sleeping: 10-15 degrees with one pillow SABA use: rarely  eg once or twice weekly  02: 2lpm cont at home/ 3lpm poc out   rec If you flare at lower prednisone doses we may need to send you to a rheumatologist  Monitor your saturations while on exertion several times a week (your bike is a good time to do this) to see if trending down as prednisone dose decreases  Please schedule a follow up visit in 3 months but call sooner if needed ESR improved so try 5 mg a/w 2.5 mg daily x 4 weeks then 2.5 mg daily until return in 3 months     08/18/2018  f/u ov/Daivion Pape re:  boop on prednisone 5 mg/ 2.5 mg daily (didn't follow instructions)  Chief Complaint  Patient presents with  . Follow-up    baseline, SOB with exertion, wheezing   Dyspnea:  Bicycle chair 3 x daily last did 2.5 weeks with 02  95%  on 2lpm on concentrator / stopped doing due to positional back pain, has not been seen for this yet, no radicular features  Cough: no Sleeping: recliner due to back  SABA use: saba 3 x in 13month 02: 2lpm / 3lpm poc out  rec Try prednisone 5 mg take one half daily    07/31/2019  f/u ov/Davy Westmoreland re:  boop on 2.5 mg daily  Chief Complaint  Patient presents with  . Follow-up    no symptoms today  Dyspnea:  Room to room / limited more by back than sob Cough: none  Sleeping: reclined due to hb x 20 degrees  SABA use: rarely  02: 2lpm but 3lpm pulsed with activity / not checking with activity   No obvious day to day or daytime variability or assoc excess/ purulent sputum or mucus plugs or hemoptysis or cp or chest tightness, subjective wheeze or overt sinus  symptoms.   Sleeping as above  without nocturnal  or early am exacerbation  of respiratory  c/o's or need for noct saba. Also denies any obvious fluctuation of symptoms with weather or environmental changes or other aggravating or alleviating factors except as outlined above   No  unusual exposure hx or h/o childhood pna/ asthma or knowledge of premature birth.  Current Allergies, Complete Past Medical History, Past Surgical History, Family History, and Social History were reviewed in [employer].  ROS  The following are not active complaints unless bolded Hoarseness, sore throat, dysphagia, dental problems, itching, sneezing,  nasal congestion or discharge of excess mucus or purulent secretions, ear ache,   fever, chills, sweats, unintended wt loss or wt gain, classically pleuritic or exertional cp,  orthopnea pnd or arm/hand swelling  or leg swelling, presyncope, palpitations, abdominal pain, anorexia, nausea, vomiting, diarrhea  or change in bowel habits or change in bladder habits, change in stools or change in urine, dysuria, hematuria,  rash, arthralgias, visual complaints, headache, numbness, weakness or ataxia or problems with walking or coordination,  change in mood or  memory.        Current Meds  Medication Sig  . acetaminophen (TYLENOL) 325 MG tablet Take 650 mg by mouth every 6 (six) hours as needed for mild pain. Take as needed per bottle   . albuterol (PROAIR HFA) 108 (90 Base) MCG/ACT inhaler INHALE 2 PUFFS BY MOUTH EVERY 4 HOURS AS NEEDED FOR WHEEZING OR FOR SHORTNESS OF BREATH  . aspirin 81 MG tablet Take 81 mg by mouth daily.  . bisoprolol (  ZEBETA) 5 MG tablet TAKE 1.5 TABLETS (7.5 MG TOTAL) BY MOUTH DAILY.  . chlorpheniramine (CHLOR-TRIMETON) 4 MG tablet Take 4 mg by mouth 2 (two) times daily as needed for allergies.  Marland Kitchen clopidogrel (PLAVIX) 75 MG tablet Take 1 tablet (75 mg total) by mouth daily.  . cyanocobalamin (,VITAMIN B-12,) 1000 MCG/ML injection Inject 1,000 mcg into the muscle every 3 (three) months.  . ENTRESTO 97-103 MG TAKE 1 TABLET BY MOUTH TWICE A DAY  . EPINEPHrine (EPIPEN 2-PAK) 0.3 mg/0.3 mL IJ SOAJ injection INJECT 0.3 MLS (0.3 MG TOTAL) INTO THE MUSCLE ONCE AS NEEDED FOR ALLERGIC REACTION  . esomeprazole  (NEXIUM) 40 MG capsule Take 1 capsule (40 mg total) by mouth daily.  . Evolocumab (REPATHA SURECLICK) 093 MG/ML SOAJ Inject 1 Dose into the skin every 14 (fourteen) days.  Marland Kitchen ezetimibe (ZETIA) 10 MG tablet TAKE 1 TABLET (10 MG TOTAL) BY MOUTH DAILY.  . famotidine (PEPCID) 20 MG tablet Take 40 mg by mouth daily.  . furosemide (LASIX) 20 MG tablet Take 1 tablet (20 mg total) by mouth every other day. (Patient taking differently: Take 10 mg by mouth every other day. )  . insulin glargine (LANTUS) 100 UNIT/ML injection INJECT 22 UNITS INTO THE SKIN DAILY. (VIAL EXPIRES 28 DAYS AFTER FIRST USE)  . methocarbamol (ROBAXIN) 500 MG tablet Take 1 tablet (500 mg total) by mouth every 8 (eight) hours as needed for muscle spasms.  . mometasone (ELOCON) 0.1 % cream Apply 1 application topically daily.  . OXYGEN 24/7 2 lpm  [employer]  . predniSONE (DELTASONE) 5 MG tablet Take 2.5 mg by mouth daily with breakfast.  . spironolactone (ALDACTONE) 25 MG tablet Take 0.5 tablets (12.5 mg total) by mouth daily.  . [DISCONTINUED] furosemide (LASIX) 20 MG tablet Take 20 mg by mouth every other day.                    Objective:   Physical Exam  07/31/2019   237 01/31/2013   237  > 07/03/2013  232 > 07/24/2013  231 > 09/06/2013  233 > 04/18/2014 256  10/18/2013  233 > 11/29/2013 238 >  03/07/2014  249 > 05/17/2014  263 > 06/26/2014 261>   10/01/2014  247 > 01/02/2015  253 >  04/16/2015 247 >  04/30/2015  244 > 06/14/2015   246 > 09/23/2015   255 > 11/04/2015 253  >  05/07/2016 259 > 06/23/2016   265  > 09/28/2016   247 > 11/20/2016  255 > 08/16/2017  231 > 11/15/2017    232 > 02/14/2018  234 > 05/17/2018   243 > 08/18/2018   243    w/c bound obese wf nad   Vital signs reviewed - Note on arrival 02 sats  97% on 2lpm      HEENT : pt wearing mask not removed for exam due to covid -19 concerns.    NECK :  without JVD/Nodes/TM/ nl carotid upstrokes bilaterally   LUNGS: no acc muscle use,  Nl contour chest with  distant bs with min crackles  in bases  bilaterally without cough on insp or exp maneuvers   CV:  RRR  no s3 or murmur or increase in P2, and no edema   ABD:  Obese/ soft and nontender with nl inspiratory excursion in the supine position. No bruits or organomegaly appreciated, bowel sounds nl  MS:   ext warm without deformities, calf tenderness, cyanosis or clubbing No obvious joint restrictions  SKIN: warm and dry without lesions    NEURO:  alert, approp, nl sensorium with  no motor or cerebellar deficits apparent.

## 2019-08-02 ENCOUNTER — Encounter: Payer: Self-pay | Admitting: Internal Medicine

## 2019-08-02 ENCOUNTER — Other Ambulatory Visit: Payer: Self-pay | Admitting: Family Medicine

## 2019-08-02 NOTE — Telephone Encounter (Signed)
If she takes one pill every other day then she would have a 90 day supply from 07/12/19?  Am I wrong?

## 2019-08-02 NOTE — Assessment & Plan Note (Signed)
11/24/2012 Office walk showed desats 86% on RA  With  spirometry FEV1 nml-73% , ratio 80   11/25/2012 CTA Chest >>neg for PE/ ILD  12/07/12 ONO > Pos 5: 47min  begin O2 2 l/m At bedtime  12/13/2012  >  Repeat 02/01/13 on 2lpm 75min> no change rx  - 01/31/2013  Walked RA x 3 laps @ 185 ft each stopped due to  End of study, no desats  - 07/03/2013 sats 75% RA >  On 3lpm sats 93%  > rx with 24 h 02 at 3lpm   - improved by self monitoring as of 07/24/13 ov so changed rx to 2lpm/24/7 humidified due to nasal dryness - 09/06/2013  Walked 2lpm x  2 laps @ 185 ft each stopped due to legs tired, no desats    - 04/18/2014  Walked RA  @ mod pace x 2 laps @ 185 ft each stopped due to  Hip pain, slow pace, sats 89% at end  - 05/16/2014  [facility] RA  2 laps @ 185 ft each stopped due to sob/fatigue but no desats off pred x 04/10/14  - 04/16/2015  Walked RA x 3 laps @ 185 ft each stopped due to   - ONO RA   10/05/2014 >  02 sat < 89% 213 min so rec resume 2lpm and work on wt loss   - 04/16/2015  Walked RA x 3 laps @ 185 ft each stopped due to  91% End of study, nl pace, no sob or desat - HCO3  01/06/16 = 35    - HCO3  05/07/2016  = 39 assoc with wt gain  - HCO3 01/11/2017     = 40  - HCO3 08/16/2017     =  35 - HC03  08/27/17            = 30  - HC03  03/24/18          = 29  - 05/17/2018   Walked 3lpm POC  x one lap @ 185 stopped due to  Back pain / sats still 92 nl pace  As of  07/31/2019    02 2lpm sleeping, resting/  3lpm with activity   Reminded: Make sure you check your oxygen saturations at highest level of activity to be sure it stays over 90% and adjust upward to maintain this level if needed but remember to turn it back to previous settings when you stop (to conserve your supply).           Each maintenance medication was reviewed in detail including emphasizing most importantly the difference between maintenance and prns and under what circumstances the prns are to be triggered using an action plan format that is not  reflected in the computer generated alphabetically organized AVS which I have not found useful in most complex patients, especially with respiratory illnesses  Total time for H and P, chart review, counseling, and generating AVS / charting = 20 min

## 2019-08-02 NOTE — Assessment & Plan Note (Signed)
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 > discussed with IR, rec FOB > done 07/05/2013 > nl airways > neg afb/fungus/path  - IR Bx 07/11/2013 > inflammatory, special stains pending but does not look like tb or fungus and responds short course pred per pt > rec prednisone x 6 days only and needs VATS bx but declines as of 07/13/2013 > rec'd final path from [place] review > c/w BOOP though can't exclude asp/infection sources - 07/19/2013 notified pt and will return 07/24/13 for cxr/ esr then consider steroid rx (needs crypto serology also) - 07/24/2013 ESR 42 off pred x 3 weeks - 07/24/13 > crypto serology neg/ Anti-CCP neg  - started maint   prednisone 09/06/2013  >>> cxr cleared 10/18/2013 on 20 mg per day > rec taper to 20/10 - 11/28/13 reduced to 10 mg daily  - 03/07/2014 5 mg x 2 weeks then 5 mg qod x 2 weeks and stopped pred  04/10/14  - ESR 04/16/2015  = 24 / recurrent nodules > rec pred 20 mg daily > nodules resolved on f/u cxr 06/14/2015  - 04/30/2015 rec ceiling of 20 and floor of 10/5  - ESR 11/04/2015 =  23  On pred 10/5, rec taper off x 6 weeks if tol> flared in June 2017 so resumed last week in June 2017 20 ceiling/ 10/5 floor - ESR 05/07/2016   =  17 on prednisone 20 mg daily> rec 20/10 > did not tol  - ESR 11/20/2016  =  21 on pred 20/15 so rec 15 mg daily  - ESR 01/11/2017  =  46 with ? Mild  Flare on pred 5 so rec 10 until better then taper to floor of 10 /5  > did not tol - ESR 08/16/2017 =   54 but cxr / exam ok On pred 10 mg daily>  rec try 10/5 again with ceiling of 20 mg per day if worse  - ESR 02/14/2018  = 38 on 10-5-5 > stay on 5 mg daily  - ESR 05/17/2018 = 25 on 5 mg daily so rec 5/2.5 x 1 month then 2.5 mg daily  - 08/18/2018 try 2.5 mg daily  - 07/31/2019 taper off pred x  3 months   Should be able to taper off prednisone now using a very slow alternate  day approach looking for both new resp symptoms and fatigue, nausea to indicate need for higher dose    Pt informed of the seriousness of COVID 19 infection as a direct risk to lung health  and safey and to close contacts and should continue to wear a facemask in public and minimize exposure to public locations but especially avoid any area or activity where non-close contacts are not observing distancing or wearing an appropriate face mask.  I strongly recommended vaccine when offered.

## 2019-08-02 NOTE — Telephone Encounter (Signed)
Request from pharmacy. Last filled on 07/12/2019 #45 with 0 refill  LOV 07/17/2019 CPE

## 2019-08-08 ENCOUNTER — Other Ambulatory Visit: Payer: Self-pay | Admitting: *Deleted

## 2019-08-08 MED ORDER — CLOPIDOGREL BISULFATE 75 MG PO TABS: 75.0000 mg | ORAL_TABLET | Freq: Every day | ORAL | 1 refills | Status: DC

## 2019-08-08 NOTE — Telephone Encounter (Signed)
Last filled on 07/08/18 #90 tabs with 3 refills, last OV was her CPE on 07/17/19, please advise   [facility] [address]

## 2019-08-13 ENCOUNTER — Other Ambulatory Visit: Payer: Self-pay | Admitting: Internal Medicine

## 2019-08-16 DIAGNOSIS — R0902 Hypoxemia: Secondary | ICD-10-CM | POA: Diagnosis not present

## 2019-09-16 DIAGNOSIS — R0902 Hypoxemia: Secondary | ICD-10-CM | POA: Diagnosis not present

## 2019-10-14 DIAGNOSIS — R0902 Hypoxemia: Secondary | ICD-10-CM | POA: Diagnosis not present

## 2019-10-16 ENCOUNTER — Ambulatory Visit (HOSPITAL_COMMUNITY)
Admission: RE | Admit: 2019-10-16 | Discharge: 2019-10-16 | Disposition: A | Discharge status: Home / Self Care | Payer: Medicare Other | Source: Ambulatory Visit | Attending: Cardiology | Admitting: Cardiology

## 2019-10-16 ENCOUNTER — Ambulatory Visit (HOSPITAL_BASED_OUTPATIENT_CLINIC_OR_DEPARTMENT_OTHER)
Admission: RE | Admit: 2019-10-16 | Discharge: 2019-10-16 | Disposition: A | Discharge status: Home / Self Care | Payer: Medicare Other | Source: Ambulatory Visit | Attending: Cardiology | Admitting: Cardiology

## 2019-10-16 ENCOUNTER — Encounter (HOSPITAL_COMMUNITY): Payer: Self-pay | Admitting: Cardiology

## 2019-10-16 ENCOUNTER — Other Ambulatory Visit: Payer: Self-pay

## 2019-10-16 VITALS — BP 114/70 | HR 75

## 2019-10-16 DIAGNOSIS — Z87891 Personal history of nicotine dependence: Secondary | ICD-10-CM | POA: Diagnosis not present

## 2019-10-16 DIAGNOSIS — I5022 Chronic systolic (congestive) heart failure: Secondary | ICD-10-CM | POA: Insufficient documentation

## 2019-10-16 DIAGNOSIS — Z794 Long term (current) use of insulin: Secondary | ICD-10-CM | POA: Insufficient documentation

## 2019-10-16 DIAGNOSIS — I255 Ischemic cardiomyopathy: Secondary | ICD-10-CM | POA: Insufficient documentation

## 2019-10-16 DIAGNOSIS — Z9981 Dependence on supplemental oxygen: Secondary | ICD-10-CM | POA: Diagnosis not present

## 2019-10-16 DIAGNOSIS — Z7902 Long term (current) use of antithrombotics/antiplatelets: Secondary | ICD-10-CM | POA: Diagnosis not present

## 2019-10-16 DIAGNOSIS — E119 Type 2 diabetes mellitus without complications: Secondary | ICD-10-CM | POA: Diagnosis not present

## 2019-10-16 DIAGNOSIS — Z8249 Family history of ischemic heart disease and other diseases of the circulatory system: Secondary | ICD-10-CM | POA: Insufficient documentation

## 2019-10-16 DIAGNOSIS — Z79899 Other long term (current) drug therapy: Secondary | ICD-10-CM | POA: Diagnosis not present

## 2019-10-16 DIAGNOSIS — Z823 Family history of stroke: Secondary | ICD-10-CM | POA: Diagnosis not present

## 2019-10-16 DIAGNOSIS — I2510 Atherosclerotic heart disease of native coronary artery without angina pectoris: Secondary | ICD-10-CM | POA: Insufficient documentation

## 2019-10-16 DIAGNOSIS — Z87440 Personal history of urinary (tract) infections: Secondary | ICD-10-CM | POA: Insufficient documentation

## 2019-10-16 DIAGNOSIS — I110 Hypertensive heart disease with heart failure: Secondary | ICD-10-CM | POA: Insufficient documentation

## 2019-10-16 DIAGNOSIS — I5042 Chronic combined systolic (congestive) and diastolic (congestive) heart failure: Secondary | ICD-10-CM | POA: Diagnosis not present

## 2019-10-16 DIAGNOSIS — E785 Hyperlipidemia, unspecified: Secondary | ICD-10-CM | POA: Insufficient documentation

## 2019-10-16 DIAGNOSIS — Z86718 Personal history of other venous thrombosis and embolism: Secondary | ICD-10-CM | POA: Insufficient documentation

## 2019-10-16 DIAGNOSIS — Z7952 Long term (current) use of systemic steroids: Secondary | ICD-10-CM | POA: Diagnosis not present

## 2019-10-16 DIAGNOSIS — Z7982 Long term (current) use of aspirin: Secondary | ICD-10-CM | POA: Insufficient documentation

## 2019-10-16 DIAGNOSIS — Z955 Presence of coronary angioplasty implant and graft: Secondary | ICD-10-CM | POA: Diagnosis not present

## 2019-10-16 DIAGNOSIS — K219 Gastro-esophageal reflux disease without esophagitis: Secondary | ICD-10-CM | POA: Diagnosis not present

## 2019-10-16 DIAGNOSIS — Z833 Family history of diabetes mellitus: Secondary | ICD-10-CM | POA: Insufficient documentation

## 2019-10-16 LAB — BASIC METABOLIC PANEL
Anion gap: 6 (ref 5–15)
BUN: 26 mg/dL — ABNORMAL HIGH (ref 8–23)
CO2: 27 mmol/L (ref 22–32)
Calcium: 9.2 mg/dL (ref 8.9–10.3)
Chloride: 107 mmol/L (ref 98–111)
Creatinine, Ser: 1.22 mg/dL — ABNORMAL HIGH (ref 0.44–1.00)
GFR calc Af Amer: 49 mL/min — ABNORMAL LOW (ref >60)
GFR calc non Af Amer: 42 mL/min — ABNORMAL LOW (ref >60)
Glucose, Bld: 120 mg/dL — ABNORMAL HIGH (ref 70–99)
Potassium: 5.3 mmol/L — ABNORMAL HIGH (ref 3.5–5.1)
Sodium: 140 mmol/L (ref 135–145)

## 2019-10-16 LAB — ECHOCARDIOGRAM COMPLETE

## 2019-10-16 NOTE — Progress Notes (Signed)
Echocardiogram 2D Echocardiogram has been performed.  Oneal Deputy Noam Franzen 10/16/2019, 11:42 AM

## 2019-10-16 NOTE — Progress Notes (Signed)
HF Cardiology: Dr Aundra Dubin  Pulmonologist: Dr Melvyn Novas.  PCP: Abner Greenspan, MD  HPI: Kelli Velasquez is a 79 y.o. 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   She had a 32 day-long admission (10/31-12/1/18) for acute hypoxic respiratory failure secondary to acute decompensated HF which was a new diagnosis at the time. She required intubation x2 and hospital course was complicated by MSSA pneumonia and RUE DVT.  LHC 06/16/17 with 95% stenosis of mid RCA and s/p DES. She was discharged to SNF on Entresto 49/51, Lasix 40 QD, spironolactone 25 QD, Plavix and Eliquis 5 BID. Weight 219 pounds at time of discharge.   Admitted 07/07/17 from SNF with presyncope and hypotension. HF meds held. Received IV fluids and antibiotics.   Echo in 3/20 showed EF 55-60% with basal inferior hypokinesis and normal RV size and systolic function.  Echo was done again today and reviewed, EF remains 55-60% with mild LVH, basal inferior hypokinesis.   She is slowly tapering down on prednisone (for BOOP), she will stop prednisone this month.  She continues on home oxygen.  No dyspnea walking on flat ground as long as she wears oxygen. No orthopnea/PND.  No chest pain.    ECG (personally reviewed): NSR, inferior Qs, poor RWP.   Labs (1/19): K 5.1, creatinine 1.1 Labs (2/19): K 4.5, creatinine 1.13, LDL 79, HDL 55, hgb 12.3 Labs (8/19): K 4.9, creatinine 1.26 Labs (12/19): K 4.6, creatinine 1.26 Labs (12/20): hgb 12.1, K 4.5, creatinine 1.3, LDL 28, TGs 190  PMH: 1. BOOP: Chronic prednisone, home oxygen.  2. Recurrent UTIs 3. HTN 4. LUE DVT 11/18 associated with PICC.  5. CAD:  - 2011 DES to LAD and RCA.  - LHC (11/18) with 95% mid RCA stenosis => DES.  6. Chronic systolic CHF: Ischemic cardiomyopathy.   - Echo (10/18) with EF 35-40%.  - Echo (2/19) with EF 40-45%,  mild LVH, normal RV.  - Echo (3/20) with EF 55-60%, basal inferior hypokinesis, normal RV.  - Echo (3/21): EF 55-60%, basal inferior hypokinesis.  7. GERD 8. Hyperlipidemia  ROS: All systems negative except as listed in HPI, PMH and Problem List.  Social History   Socioeconomic History  . Marital status: Married    Spouse name: Not on file  . Number of children: Not on file  . Years of education: Not on file  . Highest education level: Not on file  Occupational History  . Not on file  Tobacco Use  . Smoking status: Former Smoker    Packs/day: 1.00    Years: 25.00    Pack years: 25.00    Types: Cigarettes    Quit date: 07/27/1982    Years since quitting: 37.2  . Smokeless tobacco: Never Used  Substance and Sexual Activity  . Alcohol use: Yes    Alcohol/week: 0.0 standard drinks    Comment: wine-rare  . Drug use: No  . Sexual activity: Not Currently  Other Topics Concern  . Not on file  Social History Narrative  . Not on file   Social Determinants of Health   Financial Resource Strain:   . Difficulty of Paying Living Expenses:   Food Insecurity:   . Worried About Charity fundraiser in the Last Year:   . [place] in the Last Year:  Transportation Needs:   . Film/video editor (Medical):   Marland Kitchen Lack of Transportation (Non-Medical):   Physical Activity:   . Days of Exercise per Week:   . Minutes of Exercise per Session:   Stress:   . Feeling of Stress :   Social Connections:   . Frequency of Communication with Friends and Family:   . Frequency of Social Gatherings with Friends and Family:   . Attends Religious Services:   . Active Member of Clubs or Organizations:   . Attends Archivist Meetings:   Marland Kitchen Marital Status:   Intimate Partner Violence:   . Fear of Current or Ex-Partner:   . Emotionally Abused:   Marland Kitchen Physically Abused:   . Sexually Abused:    Family History  Problem Relation Age of Onset  . Stroke Mother   . Diabetes Mother 81   . Stroke Father   . Heart failure Father   . Breast cancer Maternal Grandmother   . Colon cancer Neg Hx     Current Outpatient Medications  Medication Sig Dispense Refill  . acetaminophen (TYLENOL) 325 MG tablet Take 650 mg by mouth every 6 (six) hours as needed for mild pain. Take as needed per bottle     . albuterol (PROAIR HFA) 108 (90 Base) MCG/ACT inhaler INHALE 2 PUFFS BY MOUTH EVERY 4 HOURS AS NEEDED FOR WHEEZING OR FOR SHORTNESS OF BREATH 8 Inhaler 5  . aspirin 81 MG tablet Take 81 mg by mouth daily.    . bisoprolol (ZEBETA) 5 MG tablet TAKE 1.5 TABLETS (7.5 MG TOTAL) BY MOUTH DAILY. 135 tablet 2  . chlorpheniramine (CHLOR-TRIMETON) 4 MG tablet Take 4 mg by mouth 2 (two) times daily as needed for allergies.    Marland Kitchen clopidogrel (PLAVIX) 75 MG tablet Take 1 tablet (75 mg total) by mouth daily. 90 tablet 1  . cyanocobalamin (,VITAMIN B-12,) 1000 MCG/ML injection Inject 1,000 mcg into the muscle every 3 (three) months.    . ENTRESTO 97-103 MG TAKE 1 TABLET BY MOUTH TWICE A DAY 60 tablet 6  . EPINEPHrine (EPIPEN 2-PAK) 0.3 mg/0.3 mL IJ SOAJ injection INJECT 0.3 MLS (0.3 MG TOTAL) INTO THE MUSCLE ONCE AS NEEDED FOR ALLERGIC REACTION 2 Device 1  . erythromycin ophthalmic ointment USE SMALL AMOUNT TO AFFECTED EYE AS NEEDED    . esomeprazole (NEXIUM) 40 MG capsule Take 1 capsule (40 mg total) by mouth daily. 90 capsule 3  . Evolocumab (REPATHA SURECLICK) XX123456 MG/ML SOAJ Inject 1 Dose into the skin every 14 (fourteen) days. 2 pen 11  . ezetimibe (ZETIA) 10 MG tablet TAKE 1 TABLET (10 MG TOTAL) BY MOUTH DAILY. 90 tablet 3  . famotidine (PEPCID) 20 MG tablet Take 40 mg by mouth daily.    . furosemide (LASIX) 20 MG tablet Take 10 mg by mouth every other day.    . insulin glargine (LANTUS) 100 UNIT/ML injection INJECT 22 UNITS INTO THE SKIN DAILY. (VIAL EXPIRES 28 DAYS AFTER FIRST USE) 30 mL 3  . methocarbamol (ROBAXIN) 500 MG tablet Take 1 tablet (500 mg total) by mouth every 8 (eight) hours as  needed for muscle spasms. 90 tablet 0  . mometasone (ELOCON) 0.1 % cream Apply 1 application topically daily. 15 g 3  . OXYGEN 24/7 2 lpm  [employer]    . predniSONE (DELTASONE) 5 MG tablet Take 1/2 tablet by mouth every 3rd day    . spironolactone (ALDACTONE) 25 MG tablet Take 0.5 tablets (12.5 mg total) by mouth daily.  45 tablet 3   No current facility-administered medications for this encounter.    Vitals:   10/16/19 1214  BP: 114/70  Pulse: 75  SpO2: 98%    PHYSICAL EXAM: General: NAD Neck: No JVD, no thyromegaly or thyroid nodule.  Lungs: Clear to auscultation bilaterally with normal respiratory effort. CV: Nondisplaced PMI.  Heart regular S1/S2, no S3/S4, no murmur.  No peripheral edema.  No carotid bruit.  Normal pedal pulses.  Abdomen: Soft, nontender, no hepatosplenomegaly, no distention.  Skin: Intact without lesions or rashes.  Neurologic: Alert and oriented x 3.  Psych: Normal affect. Extremities: No clubbing or cyanosis.  HEENT: Normal.   ASSESSMENT & PLAN: 1. Chronic systolic CHF: Ischemic cardiomyopathy.  Echo was done today and reviewed, showing that EF remains improved at 55-60%.  NYHA class II symptoms, chronic.  She is not volume overloaded.  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   - Continue spironolactone 12.5 daily.  BMET today.  - Continue Entresto 97/103 bid.   - Continue bisoprolol 7.5 mg daily.   - Continue Lasix 10 mg qod.  - She is currently out of ICD range.  2. CAD: Most recently had DES to [facility] in 11/18.  No chest pain.   - Continue ASA 81 and Plavix 75.   - She cannot tolerate statins, now on Repatha and Zetia.  Good lipids in 12/20.      3. BOOP: On home oxygen and chronic prednisone, to finish soon.  - I recommended pulmonary rehab.  4. RUE DVT: In 11/18, related to PICC.  Now off Eliquis.  Followup in 6 months but will need BMET in 3 months.   Kelli Velasquez 10/16/2019

## 2019-10-16 NOTE — Patient Instructions (Signed)
NO medication changes today!  Labs today and repeat in 3 months We will only contact you if something comes back abnormal or we need to make some changes. Otherwise no news is good news!  Your physician recommends that you schedule a follow-up appointment in: 6 months, office will call you to schedule this appointment.   GARAGE CODE:  June 5007   Please call office at [phone] option [phone] if you have any questions or concerns.   At the [facility], you and your health needs are our priority. As part of our continuing mission to provide you with exceptional heart care, we have created designated Provider Care Teams. These Care Teams include your primary Cardiologist (physician) and Advanced Practice Providers (APPs- Physician Assistants and Nurse Practitioners) who all work together to provide you with the care you need, when you need it.   You may see any of the following providers on your designated Care Team at your next follow up: Marland Kitchen Dr Glori Bickers . Dr Loralie Champagne . Darrick Grinder, NP . Lyda Jester, PA . Audry Riles, PharmD   Please be sure to bring in all your medications bottles to every appointment.

## 2019-10-19 ENCOUNTER — Ambulatory Visit (INDEPENDENT_AMBULATORY_CARE_PROVIDER_SITE_OTHER): Payer: Medicare Other

## 2019-10-19 ENCOUNTER — Other Ambulatory Visit: Payer: Self-pay

## 2019-10-19 DIAGNOSIS — E538 Deficiency of other specified B group vitamins: Secondary | ICD-10-CM

## 2019-10-19 MED ORDER — CYANOCOBALAMIN 1000 MCG/ML IJ SOLN
1000.0000 ug | Freq: Once | INTRAMUSCULAR | Status: AC
  Administered 2019-10-19: 1000 ug via INTRAMUSCULAR

## 2019-10-19 NOTE — Progress Notes (Signed)
Pt given q 3 months B12 injection in right deltoid. Tolerated well.  Forwarding to Dr Lorelei Pont in Dr Marliss Coots absence.

## 2019-11-14 DIAGNOSIS — R0902 Hypoxemia: Secondary | ICD-10-CM | POA: Diagnosis not present

## 2019-11-17 ENCOUNTER — Other Ambulatory Visit: Payer: Self-pay

## 2019-11-17 ENCOUNTER — Encounter: Payer: Self-pay | Admitting: Family Medicine

## 2019-11-17 ENCOUNTER — Ambulatory Visit (INDEPENDENT_AMBULATORY_CARE_PROVIDER_SITE_OTHER): Payer: Medicare Other | Admitting: Family Medicine

## 2019-11-17 VITALS — BP 136/68 | HR 95 | Temp 96.9°F

## 2019-11-17 DIAGNOSIS — N3000 Acute cystitis without hematuria: Secondary | ICD-10-CM | POA: Insufficient documentation

## 2019-11-17 DIAGNOSIS — R300 Dysuria: Secondary | ICD-10-CM

## 2019-11-17 DIAGNOSIS — N3001 Acute cystitis with hematuria: Secondary | ICD-10-CM

## 2019-11-17 DIAGNOSIS — E1169 Type 2 diabetes mellitus with other specified complication: Secondary | ICD-10-CM

## 2019-11-17 DIAGNOSIS — J8489 Other specified interstitial pulmonary diseases: Secondary | ICD-10-CM

## 2019-11-17 DIAGNOSIS — E669 Obesity, unspecified: Secondary | ICD-10-CM

## 2019-11-17 LAB — POCT UA - MICROSCOPIC ONLY

## 2019-11-17 LAB — POC URINALSYSI DIPSTICK (AUTOMATED)
Blood, UA: 200
Spec Grav, UA: >=1.03 — AB (ref 1.010–1.025)
pH, UA: 6 (ref 5.0–8.0)

## 2019-11-17 MED ORDER — SULFAMETHOXAZOLE-TRIMETHOPRIM 800-160 MG PO TABS: 1.0000 | ORAL_TABLET | Freq: Two times a day (BID) | ORAL | 0 refills | Status: DC

## 2019-11-17 NOTE — Assessment & Plan Note (Signed)
No longer on prednisone which is helpful

## 2019-11-17 NOTE — Progress Notes (Signed)
Subjective:    Patient ID: Kelli Velasquez, female    DOB: 1941/01/05, 79 y.o.   MRN: [id number]  This visit occurred during the SARS-CoV-2 public health emergency.  Safety protocols were in place, including screening questions prior to the visit, additional usage of staff PPE, and extensive cleaning of exam room while observing appropriate contact time as indicated for disinfecting solutions.    HPI Pt presents with c/o urinary symptoms   Wt Readings from Last 3 Encounters:  07/31/19 237 lb 12.8 oz (107.9 kg)  07/17/19 237 lb 6.4 oz (107.7 kg)  07/11/19 247 lb 3.2 oz (112.1 kg)   pt declined wt today      She has h/o renal insuff Lab Results  Component Value Date   CREATININE 1.22 (H) 10/16/2019   BUN 26 (H) 10/16/2019   NA 140 10/16/2019   K 5.3 (H) 10/16/2019   CL 107 10/16/2019   CO2 27 10/16/2019   Does not get freq utis   Symptoms started yesterday  Frequency/urgency Burning to urinate  Cloudy urine  Bladder pain to urinate  Tiny bit of blood once or twice  No new flank pain  Drinking lots of water   No nausea or fever   Pos UA Results for orders placed or performed in visit on 11/17/19  POCT Urinalysis Dipstick (Automated)  Result Value Ref Range   Color, UA Orange    Clarity, UA Cloudy    Glucose, UA     Bilirubin, UA     Ketones, UA     Spec Grav, UA >=1.030 (A) 1.010 - 1.025   Blood, UA 200 Ery/uL    pH, UA 6.0 5.0 - 8.0   Protein, UA     Urobilinogen, UA     Nitrite, UA     Leukocytes, UA Moderate (2+) (A) Negative  POCT UA - Microscopic Only  Result Value Ref Range   WBC, Ur, HPF, POC many    RBC, urine, microscopic many    Bacteria, U Microscopic mod    Mucus, UA few    Epithelial cells, urine per micros few    Crystals, Ur, HPF, POC none    Casts, Ur, LPF, POC none    Yeast, UA none       Had her covid vaccines    Taking azo so ua is hard to read   Takes tylenol bid -for joint pain  Patches also   Patient Active Problem List    Diagnosis Date Noted  . Acute cystitis 11/17/2019  . Renal insufficiency 07/17/2019  . History of arm fracture 03/21/2019  . Low back pain 08/23/2018  . Eczema 07/08/2018  . Hoarseness of voice 01/03/2018  . Coronary artery disease 07/07/2017  . Chronic systolic heart failure ([place]) 07/07/2017  . Abnormal urinalysis 07/07/2017  . History of DVT (deep vein thrombosis) 07/07/2017  . Diabetes mellitus type 2 in obese ([place]) 07/07/2017  . BOOP (bronchiolitis obliterans with organizing pneumonia) ([place])   . Hypoxemia   . Encounter for screening mammogram for breast cancer 02/02/2017  . Chronic combined systolic and diastolic CHF, NYHA class 2 ([place]) 02/02/2017  . Depressed mood 12/02/2016  . Routine general medical examination at a health care facility 12/30/2015  . Supraumbilical hernia [id number]  . Lump in the abdomen 04/10/2015  . GERD (gastroesophageal reflux disease) 10/10/2014  . B12 deficiency 10/09/2014  . Cataracts, bilateral 10/09/2014  . Dry eyes 10/09/2014  . Fatigue 05/08/2014  . Pedal edema 05/08/2014  .  Osteopenia 11/28/2013  . Estrogen deficiency 11/02/2013  . Dyspnea on exertion 06/27/2013  . Hernia, incisional 04/03/2013  . 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 Cough 12/18/2012  . Chronic respiratory failure with hypoxia and hypercapnia ([place]) 10/26/2012  . Special screening for malignant neoplasms, colon 06/16/2011  . CAD, NATIVE VESSEL 08/19/2009  . 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 Morbid obesity due to excess calories ([place]) comp by hbp/ hyperlipidemia/ dm/ihd 09/01/2007  . MYOPIA 03/03/2007  . Hyperlipidemia associated with type 2 diabetes mellitus ([place]) 10/15/2006  . Essential hypertension 10/15/2006  . Allergic rhinitis 10/15/2006  . Cough variant asthma vs UACS  10/15/2006  . OVERACTIVE BLADDER 10/15/2006  . OSTEOARTHRITIS 10/15/2006  . Urinary incontinence 10/15/2006   Past Medical History:  Diagnosis Date   . Allergy    allergic rhinitis  . Asthma   . Chronic bronchitis ([place])   . Coronary artery disease    cath January 2011 with DEs LAD and RCA  . Dyspnea   . Full dentures   . Gallstones   . GERD (gastroesophageal reflux disease)   . History of echocardiogram    Echo 5/17: mod LVH, EF 50-55%, ant-septal HK, Gr 1 DD, mod LAE //  b.  Echo 7/17: mild concentric LVH, EF 45-50%, inf-lat, inf, inf-septal HK, mild LAE  . History of nuclear stress test    Myoview 7/17: EF 48%, small mild apical defect, no ischemia, low risk  . Hyperlipidemia   . Hypertension   . Myocardial infarction ([place])    subendocardial, initial episode, 2010 two stents placed  . Myopia   . 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 Nocturnal oxygen desaturation    o2 at night  . Obesity   . Osteoarthritis    knees, fingers, shoulders  . Overactive bladder   . Oxygen deficiency    uses oxygen all day  . Rash    and other non specific skin eruptions  . Retaining fluid    in ankles and feet  . Urinary incontinence   . Vertigo   . Wears glasses    Past Surgical History:  Procedure Laterality Date  . CATARACT EXTRACTION W/PHACO Right 12/16/2016   Procedure: CATARACT EXTRACTION PHACO AND INTRAOCULAR LENS PLACEMENT ([place])  right;  Surgeon: Leandrew Koyanagi, MD;  Location: [facility];  Service: Ophthalmology;  Laterality: Right;  . CATARACT EXTRACTION W/PHACO Left 02/03/2017   Procedure: CATARACT EXTRACTION PHACO AND INTRAOCULAR LENS PLACEMENT ([place])  Left  Complicated;  Surgeon: Leandrew Koyanagi, MD;  Location: [facility];  Service: Ophthalmology;  Laterality: Left;  [place] Uses oxygen   . CHOLECYSTECTOMY  92  . COLONOSCOPY    . CORONARY ANGIOGRAPHY N/A 06/16/2017   Procedure: CORONARY ANGIOGRAPHY;  Surgeon: Larey Dresser, MD;  Location: [facility];  Service: Cardiovascular;  Laterality: N/A;  . CORONARY ANGIOPLASTY WITH STENT PLACEMENT  07/2009   stent  .  CORONARY STENT INTERVENTION N/A 06/16/2017   Procedure: CORONARY STENT INTERVENTION;  Surgeon: Wellington Hampshire, MD;  Location: [facility];  Service: Cardiovascular;  Laterality: N/A;  . DILATION AND CURETTAGE OF UTERUS  1984  . INCISIONAL HERNIA REPAIR N/A 05/16/2013   Procedure: HERNIA REPAIR INFRAUMILICAL INCISIONAL;  Surgeon: Joyice Faster. Cornett, MD;  Location: [facility];  Service: General;  Laterality: N/A;  umbilical  . INSERTION OF MESH N/A 05/16/2013   Procedure: INSERTION OF MESH;  Surgeon: Joyice Faster. Cornett,  MD;  Location: [facility];  Service: General;  Laterality: N/A;  umbilical  . JOINT REPLACEMENT  2007   right total knee replacement  . RIGHT/LEFT HEART CATH AND CORONARY ANGIOGRAPHY N/A 06/16/2017   Procedure: RIGHT/LEFT HEART CATH AND CORONARY ANGIOGRAPHY;  Surgeon: Larey Dresser, MD;  Location: [place] [facility];  Service: Cardiovascular;  Laterality: N/A;  . TOTAL KNEE ARTHROPLASTY  2010   left , then vocal cord infection post op  . VIDEO BRONCHOSCOPY Bilateral 07/05/2013   Procedure: VIDEO BRONCHOSCOPY WITH FLUORO;  Surgeon: Tanda Rockers, MD;  Location: [facility];  Service: Cardiopulmonary;  Laterality: Bilateral;  . vocal cord polypectomy     Social History   Tobacco Use  . Smoking status: Former Smoker    Packs/day: 1.00    Years: 25.00    Pack years: 25.00    Types: Cigarettes    Quit date: 07/27/1982    Years since quitting: 37.3  . Smokeless tobacco: Never Used  Substance Use Topics  . Alcohol use: Yes    Alcohol/week: 0.0 standard drinks    Comment: wine-rare  . Drug use: No   Family History  Problem Relation Age of Onset  . Stroke Mother   . Diabetes Mother 16  . Stroke Father   . Heart failure Father   . Breast cancer Maternal Grandmother   . Colon cancer Neg Hx    Allergies  Allergen Reactions  . Fish Allergy Anaphylaxis  . Shellfish Allergy Anaphylaxis  . Aspirin     REACTION: nausea and vomiting  High doses  . Atorvastatin     REACTION: leg pain  . Crestor [Rosuvastatin Calcium] Other (See Comments)    Muscle pain - allergy/intolerance  . Penicillins     Due to mold allergy per pt  . Simvastatin     REACTION: muscle pain  . Trandolapril     REACTION: leg pain   Current Outpatient Medications on File Prior to Visit  Medication Sig Dispense Refill  . acetaminophen (TYLENOL) 325 MG tablet Take 650 mg by mouth every 6 (six) hours as needed for mild pain. Take as needed per bottle     . albuterol (PROAIR HFA) 108 (90 Base) MCG/ACT inhaler INHALE 2 PUFFS BY MOUTH EVERY 4 HOURS AS NEEDED FOR WHEEZING OR FOR SHORTNESS OF BREATH 8 Inhaler 5  . aspirin 81 MG tablet Take 81 mg by mouth daily.    . bisoprolol (ZEBETA) 5 MG tablet TAKE 1.5 TABLETS (7.5 MG TOTAL) BY MOUTH DAILY. 135 tablet 2  . chlorpheniramine (CHLOR-TRIMETON) 4 MG tablet Take 4 mg by mouth 2 (two) times daily as needed for allergies.    Marland Kitchen clopidogrel (PLAVIX) 75 MG tablet Take 1 tablet (75 mg total) by mouth daily. 90 tablet 1  . cyanocobalamin (,VITAMIN B-12,) 1000 MCG/ML injection Inject 1,000 mcg into the muscle every 3 (three) months.    . ENTRESTO 97-103 MG TAKE 1 TABLET BY MOUTH TWICE A DAY 60 tablet 6  . EPINEPHrine (EPIPEN 2-PAK) 0.3 mg/0.3 mL IJ SOAJ injection INJECT 0.3 MLS (0.3 MG TOTAL) INTO THE MUSCLE ONCE AS NEEDED FOR ALLERGIC REACTION 2 Device 1  . erythromycin ophthalmic ointment USE SMALL AMOUNT TO AFFECTED EYE AS NEEDED    . esomeprazole (NEXIUM) 40 MG capsule Take 1 capsule (40 mg total) by mouth daily. 90 capsule 3  . Evolocumab (REPATHA SURECLICK) [id number] MG/ML SOAJ Inject 1 Dose into the skin every 14 (fourteen) days. 2 pen 11  .  ezetimibe (ZETIA) 10 MG tablet TAKE 1 TABLET (10 MG TOTAL) BY MOUTH DAILY. 90 tablet 3  . famotidine (PEPCID) 20 MG tablet Take 40 mg by mouth daily.    . furosemide (LASIX) 20 MG tablet Take 10 mg by mouth every other day.    . insulin glargine (LANTUS) 100 UNIT/ML injection  INJECT 22 UNITS INTO THE SKIN DAILY. (VIAL EXPIRES 28 DAYS AFTER FIRST USE) 30 mL 3  . methocarbamol (ROBAXIN) 500 MG tablet Take 1 tablet (500 mg total) by mouth every 8 (eight) hours as needed for muscle spasms. 90 tablet 0  . mometasone (ELOCON) 0.1 % cream Apply 1 application topically daily. 15 g 3  . OXYGEN 24/7 2 lpm  [employer]    . spironolactone (ALDACTONE) 25 MG tablet Take 0.5 tablets (12.5 mg total) by mouth daily. 45 tablet 3   No current facility-administered medications on file prior to visit.     Review of Systems  Constitutional: Positive for fatigue. Negative for activity change, appetite change and fever.  HENT: Negative for congestion and sore throat.   Eyes: Negative for itching and visual disturbance.  Respiratory: Negative for cough and shortness of breath.   Cardiovascular: Negative for leg swelling.  Gastrointestinal: Negative for abdominal distention, abdominal pain, constipation, diarrhea and nausea.  Endocrine: Negative for cold intolerance and polydipsia.  Genitourinary: Positive for dysuria, frequency, hematuria and urgency. Negative for decreased urine volume, difficulty urinating and flank pain.       Incontinence is worse with uti  Musculoskeletal: Positive for arthralgias. Negative for myalgias.  Skin: Negative for rash.  Allergic/Immunologic: Negative for immunocompromised state.  Neurological: Negative for dizziness and weakness.  Hematological: Negative for adenopathy.       Objective:   Physical Exam Constitutional:      General: She is not in acute distress.    Appearance: Normal appearance. She is well-developed. She is obese. She is not ill-appearing.     Comments: Baseline sob on exertion -on 02  HENT:     Head: Normocephalic and atraumatic.  Eyes:     General: No scleral icterus.    Conjunctiva/sclera: Conjunctivae normal.     Pupils: Pupils are equal, round, and reactive to light.  Cardiovascular:     Rate and Rhythm: Regular rhythm.  Tachycardia present.     Heart sounds: Normal heart sounds.  Pulmonary:     Effort: Pulmonary effort is normal.     Breath sounds: Normal breath sounds.     Comments: Sob after exertion improves with sitting  Abdominal:     General: Bowel sounds are normal. There is no distension.     Palpations: Abdomen is soft.     Tenderness: There is abdominal tenderness. There is no rebound.     Comments: No cva tenderness  Mild suprapubic tenderness (per pt feels pressure/full)  Musculoskeletal:     Cervical back: Normal range of motion and neck supple.  Lymphadenopathy:     Cervical: No cervical adenopathy.  Skin:    Findings: No erythema or rash.  Neurological:     Mental Status: She is alert.  Psychiatric:        Mood and Affect: Mood normal.           Assessment & Plan:   Problem List Items Addressed This Visit      Genitourinary   Acute cystitis - Primary    Voiding symptoms - taking azo otc (noted on ua) -but still pos for blood and leukocytes  Renal insuff baseline is mild (cannot use keflex due to pcn allergy) -will tx with bactrim with caution (5 d to start) Reassuring exam inst to drink water (staying within fluid restriction) inst to call if sudden worsening /rev red flags cx pending  Handout given      Relevant Orders   Urine Culture    Other Visit Diagnoses    Dysuria       Relevant Orders   POCT Urinalysis Dipstick (Automated) (Completed)

## 2019-11-17 NOTE — Patient Instructions (Signed)
Drink water (keeping with water restriction) Take the bactrim as directed  If symptoms suddenly worsen- let us know   We will have a culture report on Monday and call you

## 2019-11-17 NOTE — Assessment & Plan Note (Signed)
Voiding symptoms - taking azo otc (noted on ua) -but still pos for blood and leukocytes  Renal insuff baseline is mild (cannot use keflex due to pcn allergy) -will tx with bactrim with caution (5 d to start) Reassuring exam inst to drink water (staying within fluid restriction) inst to call if sudden worsening /rev red flags cx pending  Handout given

## 2019-11-17 NOTE — Assessment & Plan Note (Signed)
Off prednisone now 

## 2019-11-17 NOTE — Assessment & Plan Note (Signed)
Now off prednisone which is helpful

## 2019-11-19 LAB — URINE CULTURE
MICRO NUMBER:: 10399612
SPECIMEN QUALITY:: ADEQUATE

## 2019-11-20 ENCOUNTER — Other Ambulatory Visit: Payer: Self-pay | Admitting: Family Medicine

## 2019-11-20 ENCOUNTER — Telehealth: Payer: Self-pay

## 2019-11-20 MED ORDER — CIPROFLOXACIN HCL 250 MG PO TABS: 250.0000 mg | ORAL_TABLET | Freq: Two times a day (BID) | ORAL | 0 refills | Status: DC

## 2019-11-20 NOTE — Telephone Encounter (Signed)
Spoke to patient's husband and was advised that patient has been off of Prednisone for a month. Patient's husband stated that after he advised pharmacist of this they released the Cipro. Mr. Courtade stated that he picked the Cipro up today and she is getting ready to start taking it.

## 2019-11-20 NOTE — Telephone Encounter (Signed)
uti is resistant to bactrim unfortunately Need to change this   I pended a px for low dose cipro to take as directed (for 5 days)  Please let me know if this does not help Also if any side effects

## 2019-11-20 NOTE — Telephone Encounter (Signed)
I thought she was off prednisone?  Please clarify with the pharmacist and/or patient  Cipro was sent since her urine culture showed resistance to bactrim   Let me know, thanks

## 2019-11-20 NOTE — Telephone Encounter (Signed)
Looks like Dr Glori Bickers prescribed bactrim--not cipro Will send to her to review

## 2019-11-20 NOTE — Telephone Encounter (Signed)
Spoke to pt. Sent in [place] to [facility]

## 2019-11-20 NOTE — Telephone Encounter (Signed)
Someone from [employer] (did not leave a name) left v/m that there was a possible interaction for Cipro and med Dr Melvyn Novas has pt on called. [facility] called [facility] to get nameof med Dr Melvyn Novas had pt on since I could not see it on pts med list; prednisone 5 mg taking one tab po daily with breakfast; I spoke  with Tiara at [place] to find out name of med from Dr Melvyn Novas and was told prednisone but Janeal Holmes said she is not sure if pt has had a reaction between the cipro or not.

## 2019-12-12 ENCOUNTER — Telehealth: Payer: Self-pay | Admitting: Family Medicine

## 2019-12-12 NOTE — Progress Notes (Signed)
  Chronic Care Management   Note  12/12/2019 Name: Kelli Velasquez MRN: [id number] DOB: 1940-08-30  Kelli Velasquez is a 79 y.o. year old female who is a primary care patient of Tower, Wynelle Fanny, MD. I reached out to Kelli Velasquez by phone today in response to a referral sent by Kelli Velasquez's PCP, Tower, Wynelle Fanny, MD.   [name] was given information about Chronic Care Management services today including:  1. CCM service includes personalized support from designated clinical staff supervised by her physician, including individualized plan of care and coordination with other care providers 2. 24/7 contact phone numbers for assistance for urgent and routine care needs. 3. Service will only be billed when office clinical staff spend 20 minutes or more in a month to coordinate care. 4. Only one practitioner may furnish and bill the service in a calendar month. 5. The patient may stop CCM services at any time (effective at the end of the month) by phone call to the office staff.   Patient agreed to services and verbal consent obtained.   This note is not being shared with the patient for the following reason: To respect privacy (The patient or proxy has requested that the information not be shared).  Follow up plan:   Earney Hamburg [facility] Scheduler

## 2019-12-14 DIAGNOSIS — R0902 Hypoxemia: Secondary | ICD-10-CM | POA: Diagnosis not present

## 2020-01-05 ENCOUNTER — Telehealth: Payer: Self-pay | Admitting: Family Medicine

## 2020-01-05 ENCOUNTER — Other Ambulatory Visit: Payer: Self-pay | Admitting: Family Medicine

## 2020-01-05 NOTE — Telephone Encounter (Signed)
  Patient stated she has been getting dizzy recently, she is going to call her cardiologist She stated she is also having trouble with her oxygen. Her nose has been very running between her allergies and oxygen. She stated that it turns into scabs and the oxygen is not getting through as much. She wanted to know if there is something that she could do to help.   Patient was connected to Access nurse to be triaged for dizzy

## 2020-01-05 NOTE — Telephone Encounter (Signed)
Thanks for the fyi / keep me posted

## 2020-01-05 NOTE — Telephone Encounter (Signed)
Pt left message on Triage line stating [facility] nurse told her she needs an OV in 24 hours. I called her back and advised her she would have to go to the ER or UC to be seen. She said she preferred not to go to either and thinks it is one of her medications: Entresto or spironolactone both prescribed by her cardiologist. She is placing a call the her cardiologist to see what to do. I reiterated that she needs to go to ER or UC if cardiology does not respond to her.

## 2020-01-08 MED ORDER — METHOCARBAMOL 500 MG PO TABS: 500.0000 mg | ORAL_TABLET | Freq: Three times a day (TID) | ORAL | 0 refills | Status: AC | PRN

## 2020-01-08 NOTE — Telephone Encounter (Signed)
Unable to reach pt due to office phones being down; this is the access nurse note that Larene Beach CMA spoke with pt about on 01/05/20. I do not see where pt went to UC or ED in chart review note. Int his note pt also requested refill of methocarbamol. Sending to [place] for FU.

## 2020-01-08 NOTE — Telephone Encounter (Signed)
[facility] - Client TELEPHONE ADVICE RECORD [facility] Patient Name: Kelli Velasquez Gender: Female DOB: [birth year] Age: 79 Y 10 M 23 D Return Phone Number: [phone] (Primary) Address: City/State/Zip: [place] [address] Client [facility] - Client Client Site [facility] MD Contact Type Call Who Is Calling Patient / Member / Family / Caregiver Call Type Triage / Clinical Relationship To Patient Self Return Phone Number [phone] (Primary) Chief Complaint Dizziness Reason for Call Symptomatic / Request for [place] states she has dizziness. [facility]. Translation No Nurse Assessment Nurse: Thad Ranger, RN, Denise Date/Time (Eastern Time): 01/05/2020 4:18:07 PM Confirm and document reason for call. If symptomatic, describe symptoms. ---Caller states she has dizziness. [facility] but has 5-6 tabs left. Dizzy x 3-4 days. Has the patient had close contact with a person known or suspected to have the novel coronavirus illness OR traveled / lives in area with major community spread (including international travel) in the last 14 days from the onset of symptoms? * If Asymptomatic, screen for exposure and travel within the last 14 days. ---No Does the patient have any new or worsening symptoms? ---Yes Will a triage be completed? ---Yes Related visit to physician within the last 2 weeks? ---No Does the PT have any chronic conditions? (i.e. diabetes, asthma, this includes High risk factors for pregnancy, etc.) ---Yes List chronic conditions. ---HTN, CHF Is this a behavioral health or substance abuse call? ---No Guidelines Guideline Title Affirmed Question Affirmed Notes Nurse Date/Time (Eastern Time) Dizziness - Lightheadedness [1]  MODERATE dizziness (e.g., interferes with normal activities) AND [2] has NOT been evaluated by physician for this (Exception: dizziness caused by heat exposure, Carmon, RN, Langley Gauss 01/05/2020 4:20:41 PM PLEASE NOTE: All timestamps contained within this report are represented as [place] Standard Time. CONFIDENTIALTY NOTICE: This fax transmission is intended only for the addressee. It contains information that is legally privileged, confidential or otherwise protected from use or disclosure. If you are not the intended recipient, you are strictly prohibited from reviewing, disclosing, copying using or disseminating any of this information or taking any action in reliance on or regarding this information. If you have received this fax in error, please notify us immediately by telephone so that we can arrange for its return to [place]. Phone: [phone], Toll-Free: [phone], Fax: [phone] Page: 2 of 2 Call Id: [id number] Guidelines Guideline Title Affirmed Question Affirmed Notes Nurse Date/Time Eilene Ghazi Time) sudden standing, or poor fluid intake) Disp. Time Eilene Ghazi Time) Disposition Final User 01/05/2020 4:25:37 PM See PCP within 24 Hours Yes Carmon, RN, Yevette Edwards Disagree/Comply Comply Caller Understands Yes PreDisposition Call Doctor Care Advice Given Per Guideline SEE PCP WITHIN 24 HOURS: DRINK FLUIDS: * Drink several glasses of fruit juice, other clear fluids or water. LIE DOWN AND REST: * Lie down with feet elevated for 1 hour. * This will improve circulation and increase blood flow to the brain. CALL BACK IF: * Passes out (faints) * You become worse. CARE ADVICE given per Dizziness (Adult) guideline. Comments User: Romeo Apple, RN Date/Time Eilene Ghazi Time): 01/05/2020 4:25:26 PM Advised to call her cardiologist as well today. Referrals REFERRED TO PCP OFFICE

## 2020-01-08 NOTE — Telephone Encounter (Signed)
Called pt and she said she hasn't had any dizziness since Friday. Pt said she did call cardiologist and he is out of town but they scheduled her an appt next week for when he returns and they told her to call them back if she thinks she needs to be seen sooner. Pt said she is okay and cardiologist will address dizziness, Rx sent to pharmacy and pt aware   FYI to PCP

## 2020-01-08 NOTE — Addendum Note (Signed)
Addended by: Loura Pardon A on: 01/08/2020 09:56 AM   Modules accepted: Orders

## 2020-01-08 NOTE — Addendum Note (Signed)
Addended by: Tammi Sou on: 01/08/2020 11:46 AM   Modules accepted: Orders

## 2020-01-08 NOTE — Telephone Encounter (Signed)
I pended methocarbamol to send   Please check in with her to see how she is feeling

## 2020-01-14 DIAGNOSIS — R0902 Hypoxemia: Secondary | ICD-10-CM | POA: Diagnosis not present

## 2020-01-16 ENCOUNTER — Telehealth (HOSPITAL_COMMUNITY): Payer: Self-pay

## 2020-01-16 ENCOUNTER — Other Ambulatory Visit: Payer: Self-pay

## 2020-01-16 ENCOUNTER — Ambulatory Visit (HOSPITAL_COMMUNITY)
Admission: RE | Admit: 2020-01-16 | Discharge: 2020-01-16 | Disposition: A | Discharge status: Home / Self Care | Payer: Medicare Other | Source: Ambulatory Visit | Attending: Cardiology | Admitting: Cardiology

## 2020-01-16 DIAGNOSIS — I5042 Chronic combined systolic (congestive) and diastolic (congestive) heart failure: Secondary | ICD-10-CM | POA: Diagnosis not present

## 2020-01-16 LAB — BASIC METABOLIC PANEL
Anion gap: 8 (ref 5–15)
BUN: 32 mg/dL — ABNORMAL HIGH (ref 8–23)
CO2: 27 mmol/L (ref 22–32)
Calcium: 9.2 mg/dL (ref 8.9–10.3)
Chloride: 105 mmol/L (ref 98–111)
Creatinine, Ser: 1.35 mg/dL — ABNORMAL HIGH (ref 0.44–1.00)
GFR calc Af Amer: 43 mL/min — ABNORMAL LOW (ref >60)
GFR calc non Af Amer: 37 mL/min — ABNORMAL LOW (ref >60)
Glucose, Bld: 105 mg/dL — ABNORMAL HIGH (ref 70–99)
Potassium: 5.4 mmol/L — ABNORMAL HIGH (ref 3.5–5.1)
Sodium: 140 mmol/L (ref 135–145)

## 2020-01-16 NOTE — Telephone Encounter (Signed)
Patient advised and verbalized understanding. Med list updated to reflect changes,lab order entered and lab appt scheduled.

## 2020-01-16 NOTE — Telephone Encounter (Signed)
-----   Message from Larey Dresser, MD sent at 01/16/2020  1:17 PM EDT ----- With EF improved and high K, stop spironolactone.  BMET 1 week.

## 2020-01-24 ENCOUNTER — Ambulatory Visit (INDEPENDENT_AMBULATORY_CARE_PROVIDER_SITE_OTHER): Payer: Medicare Other

## 2020-01-24 ENCOUNTER — Other Ambulatory Visit: Payer: Self-pay

## 2020-01-24 DIAGNOSIS — E538 Deficiency of other specified B group vitamins: Secondary | ICD-10-CM

## 2020-01-24 MED ORDER — CYANOCOBALAMIN 1000 MCG/ML IJ SOLN
1000.0000 ug | Freq: Once | INTRAMUSCULAR | Status: AC
  Administered 2020-01-24: 1000 ug via INTRAMUSCULAR

## 2020-01-24 NOTE — Progress Notes (Signed)
Per orders of Dr. Tower, injection of B12 ° given by Maloree Uplinger. °Patient tolerated injection well. ° °

## 2020-01-25 ENCOUNTER — Other Ambulatory Visit (HOSPITAL_COMMUNITY): Payer: Medicare Other

## 2020-01-26 ENCOUNTER — Other Ambulatory Visit: Payer: Self-pay

## 2020-01-26 ENCOUNTER — Ambulatory Visit (HOSPITAL_COMMUNITY)
Admission: RE | Admit: 2020-01-26 | Discharge: 2020-01-26 | Disposition: A | Discharge status: Home / Self Care | Payer: Medicare Other | Source: Ambulatory Visit | Attending: Internal Medicine | Admitting: Internal Medicine

## 2020-01-26 DIAGNOSIS — I5042 Chronic combined systolic (congestive) and diastolic (congestive) heart failure: Secondary | ICD-10-CM | POA: Insufficient documentation

## 2020-01-26 LAB — BASIC METABOLIC PANEL
Anion gap: 6 (ref 5–15)
BUN: 33 mg/dL — ABNORMAL HIGH (ref 8–23)
CO2: 28 mmol/L (ref 22–32)
Calcium: 9.1 mg/dL (ref 8.9–10.3)
Chloride: 108 mmol/L (ref 98–111)
Creatinine, Ser: 1.41 mg/dL — ABNORMAL HIGH (ref 0.44–1.00)
GFR calc Af Amer: 41 mL/min — ABNORMAL LOW (ref >60)
GFR calc non Af Amer: 35 mL/min — ABNORMAL LOW (ref >60)
Glucose, Bld: 96 mg/dL (ref 70–99)
Potassium: 4.9 mmol/L (ref 3.5–5.1)
Sodium: 142 mmol/L (ref 135–145)

## 2020-01-31 ENCOUNTER — Ambulatory Visit: Payer: Medicare Other | Admitting: Internal Medicine

## 2020-02-02 ENCOUNTER — Ambulatory Visit: Payer: Medicare Other | Admitting: Internal Medicine

## 2020-02-05 ENCOUNTER — Ambulatory Visit: Payer: Medicare Other | Admitting: Internal Medicine

## 2020-02-05 ENCOUNTER — Encounter: Payer: Self-pay | Admitting: Internal Medicine

## 2020-02-05 ENCOUNTER — Other Ambulatory Visit: Payer: Self-pay

## 2020-02-05 DIAGNOSIS — J9611 Chronic respiratory failure with hypoxia: Secondary | ICD-10-CM

## 2020-02-05 DIAGNOSIS — R918 Other nonspecific abnormal finding of lung field: Secondary | ICD-10-CM

## 2020-02-05 DIAGNOSIS — J9612 Chronic respiratory failure with hypercapnia: Secondary | ICD-10-CM

## 2020-02-05 NOTE — Progress Notes (Signed)
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    History of Present Illness  07/03/2013 f/u ov/Kelli Velasquez re: sob/cough/ wheeze/ MPN Chief Complaint  Patient presents with  . Follow-up    Pt had PET scan done this am. Her cough and SOB have improved since the last visit.   saba inhaler helps some/ min mucoid sputum, temp to 99 but no rigors, no purulent sputum or hemoptysis/ does have some wt. Loss, no sweating.  rec Dulera 100 Take 2 puffs first thing in am and then another 2 puffs about 12 hours later.  Prednisone 10 mg take  4 each am x 2 days,   2 each am x 2 days,  1 each am x 2 days and stop Only use your albuterol as a rescue medication  We will arrange 24/7 02 at 3lpm per advanced  We will call to arrange a biopsy of the easiest area once I discuss this with radiology  Late add: Discussed in detail all the  indications, usual  risks and alternatives  relative to the benefits with patient who agrees to proceed with bronchoscopy with biopsy.   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 Acute Visit    Pt c/o increased SOB, cough, and fatigue for the past 1-2 wks. Her cough is  non prod. She states she wakes up in the am very tired.    gradually sob /cough worse p eating / eats 10 pm, no candy handy and having lots of throat clearing/ sensation of daytime pnds / no better with saba  Doe now = MMRC3 = can't walk 100 yards even at a slow pace at a flat grade s stopping due to sob   rec Please remember to go to the lab and x-ray department downstairs in the basement  for your tests - we will call you with the results when they are available.  For drainage / throat tickle try take CHLORPHENIRAMINE  4 mg - take one every 4 hours as needed GERD  Keep your previous appt  Add :  Consider challenging with symbicort to see if helps cough/ systemic steroid dep    01/11/2017  f/u ov/Kelli Velasquez re:  BOOP/ steroid dep  -  No med calendar  Chief Complaint  Patient presents with  . Follow-up    3 month follow up. Patient states she is not feeling well. Feeling congested. Coughing. States she is not able to walk 53f on 2L of o2 before feeling out of breath.   gradually tapered pred from 20/15  Started feeling worse  when reduced pred  to 5 mg daily x 3 weeks prior to OV  And weak since. Bad chills x 2 x  1-2 days prior to OV  But s excess/ purulent sputum or mucus plugs  And none in last 24h rec Please remember to go to the lab and x-ray department downstairs in the basement  for your tests - we will call you with the results when they are available. Prednisone 10 mg daily until better or otherwise notified and then when better 10/5 alternating days     Admit date: 07/07/2017 Discharge date: 07/10/2017  Recommendations for Outpatient Follow-up:  Please note that lasix dose is now 20 mg a day due to soft blood pressure in hospital. Follow up with cardio per sch appt to make sure this dose is tolerated. Aldactone is 12.5 mg a day. Continue Cipro for 4 days on discharge for UTI.   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 Hospitalization Follow-up    Breathing is worse today which she relates to colder weather. She has not had to use her rescue inhaler.   doe = MMRC3 = can't walk 100 yards even at a slow pace at a flat grade s stopping due to sob  On up to 3lpm  Sleeping fine/ hoarse but not coughing. rec Try prednisone 10 mg on even days and 5 mg on odd if tolerated       02/14/2018  f/u ov/Kelli Velasquez re: boop on pred 10 -5 -5  Chief Complaint  Patient presents with  . Follow-up    Breathing is unchanged since the last visit. She rarely uses her albuterol.   Dyspnea:  MMRC3 = can't walk 100 yards even at a slow pace at a flat grade s stopping due to sob  eg has to stop when shopping at [facility] or [employer]/ water aerobics x 1 hour 3 x per week  Cough: not much    SABA use: rare 02: 2lpm at home, 3 pulsed when out  rec Go ahead and reduce to Predisone  5 mg daily  X 2 weeks then 5/2.5 mg alternative days and leave there  Please remember to go to the lab and x-ray department downstairs in the basement  for your tests - we will call you with the results when they are available.   Please schedule a follow up visit in 3 months but call sooner if needed      05/17/2018  f/u ov/Kelli Velasquez re:   Boop/ steroid resp/? Dependent / never saw rheum on 5 mg pred daily  Chief Complaint  Patient presents with  . Follow-up    States her breathing has been up down, but albuterol has helped.    Dyspnea:  Chair bicycle 3 x wk x 30 min s  stopping on 2lpm but does not check sats with ex as rec  Cough:  none Sleeping: 10-15 degrees with one pillow SABA use: rarely  eg once or twice weekly  02: 2lpm cont at home/ 3lpm poc out   rec If you flare at lower prednisone doses we may need to send you to a rheumatologist  Monitor your saturations while on exertion several times a week (your bike is a good time to do this) to see if trending down as prednisone dose decreases  Please schedule a follow up visit in 3 months but call sooner if needed ESR improved so try 5 mg a/w 2.5 mg daily x 4 weeks then 2.5 mg daily until return in 3 months     08/18/2018  f/u ov/Kelli Velasquez re:  boop on prednisone 5 mg/ 2.5 mg daily (didn't follow instructions)  Chief Complaint  Patient presents with  . Follow-up    baseline, SOB with exertion, wheezing   Dyspnea:  Bicycle chair 3 x daily last did 2.5 weeks with 02  95%  on 2lpm on concentrator / stopped doing due to positional back pain, has not been seen for this yet, no radicular features  Cough: no Sleeping: recliner due to back  SABA use: saba 3 x in 96month 02: 2lpm / 3lpm poc out  rec Try prednisone 5 mg take one half daily    07/31/2019  f/u ov/Kelli Velasquez re:  boop on 2.5 mg daily  Chief Complaint  Patient presents with  . Follow-up    no symptoms today  Dyspnea:  Room to room / limited more by back than sob Cough: none  Sleeping: reclined due to hb x 20 degrees  SABA use: rarely  02: 2lpm but 3lpm pulsed with activity / not checking with activity rec Make sure you check your oxygen saturations at highest level of activity to be sure it stays over 90%  Drop off every 3rd dose of prednisone for month then once ever other day for a month, then take once every 3rd day for a month and stop  - resume previous dose if worse cough, shortness of breath or nausea    02/05/2020  f/u ov/Kelli Velasquez re: BOOP / off prednisone 2.5 mg  since 10/26/19 / fully vaccinated for covid 19  Chief Complaint  Patient presents  with  . Follow-up    Has been off of pred since 10/25/19 and has not noticed change in her breathing. She rarely uses her albuterol inhaler.   Dyspnea:  Limited by back but still room to room ok and no worse sob off prednisone  Cough: none Sleeping: electric bed at 20 degrees one pillow  SABA use: none 02: 3lpm 24 /7    No obvious day to day or daytime variability or assoc excess/ purulent sputum or mucus plugs or hemoptysis or cp or chest tightness, subjective wheeze or overt sinus or hb symptoms.   Sleeping as above  without nocturnal  or early am exacerbation  of respiratory  c/o's or need for noct saba. Also denies any obvious fluctuation of symptoms with weather or environmental changes or other aggravating or alleviating factors except as outlined above   No unusual exposure hx or h/o childhood pna/ asthma or knowledge of premature birth.  Current Allergies, Complete Past Medical History, Past Surgical History, Family History, and Social History were reviewed in [employer].  ROS  The following are not active complaints unless bolded Hoarseness, sore throat, dysphagia, dental problems, itching, sneezing,  nasal congestion or discharge of excess mucus or purulent secretions, ear ache,  fever, chills, sweats, unintended wt loss or wt gain, classically pleuritic or exertional cp,  orthopnea pnd or arm/hand swelling  or leg swelling, presyncope, palpitations, abdominal pain, anorexia, nausea, vomiting, diarrhea  or change in bowel habits or change in bladder habits, change in stools or change in urine, dysuria, hematuria,  rash, arthralgias, visual complaints, headache, numbness, weakness or ataxia or problems with walking or coordination,  change in mood or  memory.        Current Meds  Medication Sig  . acetaminophen (TYLENOL) 325 MG tablet Take 650 mg by mouth every 6 (six) hours as needed for mild pain. Take as needed per bottle   . albuterol (PROAIR HFA)  108 (90 Base) MCG/ACT inhaler INHALE 2 PUFFS BY MOUTH EVERY 4 HOURS AS NEEDED FOR WHEEZING OR FOR SHORTNESS OF BREATH  . aspirin 81 MG tablet Take 81 mg by mouth daily.  . bisoprolol (ZEBETA) 5 MG tablet TAKE 1.5 TABLETS (7.5 MG TOTAL) BY MOUTH DAILY.  . chlorpheniramine (CHLOR-TRIMETON) 4 MG tablet Take 4 mg by mouth 2 (two) times daily as needed for allergies.  Marland Kitchen clopidogrel (PLAVIX) 75 MG tablet TAKE 1 TABLET BY MOUTH EVERY DAY  . cyanocobalamin (,VITAMIN B-12,) 1000 MCG/ML injection Inject 1,000 mcg into the muscle every 3 (three) months.  . ENTRESTO 97-103 MG TAKE 1 TABLET BY MOUTH TWICE A DAY  . EPINEPHrine (EPIPEN 2-PAK) 0.3 mg/0.3 mL IJ SOAJ injection INJECT 0.3 MLS (0.3 MG TOTAL) INTO THE MUSCLE ONCE AS NEEDED FOR ALLERGIC REACTION  . erythromycin ophthalmic ointment USE SMALL AMOUNT TO AFFECTED EYE AS NEEDED  . esomeprazole (NEXIUM) 40 MG capsule Take 1 capsule (40 mg total) by mouth daily.  . Evolocumab (REPATHA SURECLICK) 749 MG/ML SOAJ Inject 1 Dose into the skin every 14 (fourteen) days.  Marland Kitchen ezetimibe (ZETIA) 10 MG tablet TAKE 1 TABLET (10 MG TOTAL) BY MOUTH DAILY.  . famotidine (PEPCID) 20 MG tablet Take 40 mg by mouth daily.  . furosemide (LASIX) 20 MG tablet TAKE 1 TABLET BY MOUTH EVERY OTHER DAY  . insulin glargine (LANTUS) 100 UNIT/ML injection INJECT 22 UNITS INTO THE SKIN DAILY. (VIAL EXPIRES 28 DAYS AFTER FIRST USE)  . methocarbamol (ROBAXIN) 500 MG tablet Take 1 tablet (500 mg total) by mouth every 8 (eight) hours as needed for muscle spasms.  . mometasone (ELOCON) 0.1 % cream Apply 1 application topically daily.  . OXYGEN 24/7 2 lpm  [employer]              Objective:   Physical Exam  02/05/2020   218  07/31/2019   237 01/31/2013   237  > 07/03/2013  232 > 07/24/2013  231 > 09/06/2013  233 > 04/18/2014 256  10/18/2013  233 > 11/29/2013 238 >  03/07/2014  249 > 05/17/2014  263 > 06/26/2014 261>   10/01/2014  247 > 01/02/2015  253 >  04/16/2015 247 >  04/30/2015  244 > 06/14/2015   246 >  09/23/2015   255 > 11/04/2015 253  >  05/07/2016 259 > 06/23/2016   265  > 09/28/2016   247 > 11/20/2016  255 > 08/16/2017  231 > 11/15/2017    232 > 02/14/2018  234 > 05/17/2018   243 > 08/18/2018   243    Vital signs reviewed  02/05/2020  - Note at rest 02 sats  99% on 3lpm POC  Obese pleasant w/c bound wf nad    HEENT : pt wearing mask not removed for exam  due to covid -19 concerns.    NECK :  without JVD/Nodes/TM/ nl carotid upstrokes bilaterally   LUNGS: no acc muscle use,  Nl contour chest which is clear to A and P bilaterally without cough on insp or exp maneuvers   CV:  RRR  no s3 or murmur or increase in P2, and no edema   ABD: quite obese/ soft and nontender with nl inspiratory excursion in the supine position. No bruits or organomegaly appreciated, bowel sounds nl  MS:    ext warm without deformities, calf tenderness, cyanosis or clubbing No obvious joint restrictions   SKIN: warm and dry without lesions    NEURO:  alert, approp, nl sensorium with  no motor or cerebellar deficits apparent.

## 2020-02-05 NOTE — Patient Instructions (Signed)
If you are satisfied with your treatment plan,  let your doctor know and he/she can either refill your medications or you can return here when your prescription runs out.     If in any way you are not 100% satisfied,  please tell us.  If 100% better, tell your friends!  Pulmonary follow up is as needed   

## 2020-02-07 ENCOUNTER — Encounter: Payer: Self-pay | Admitting: Internal Medicine

## 2020-02-07 NOTE — Assessment & Plan Note (Signed)
11/24/2012 Office walk showed desats 86% on RA  With  spirometry FEV1 nml-73% , ratio 80   11/25/2012 CTA Chest >>neg for PE/ ILD  12/07/12 ONO > Pos 5: 51min  begin O2 2 l/m At bedtime  12/13/2012  >  Repeat 02/01/13 on 2lpm 26min> no change rx  - 01/31/2013  Walked RA x 3 laps @ 185 ft each stopped due to  End of study, no desats  - 07/03/2013 sats 75% RA >  On 3lpm sats 93%  > rx with 24 h 02 at 3lpm   - improved by self monitoring as of 07/24/13 ov so changed rx to 2lpm/24/7 humidified due to nasal dryness - 09/06/2013  Walked 2lpm x  2 laps @ 185 ft each stopped due to legs tired, no desats    - 04/18/2014  Walked RA  @ mod pace x 2 laps @ 185 ft each stopped due to  Hip pain, slow pace, sats 89% at end  - 05/16/2014  [facility] RA  2 laps @ 185 ft each stopped due to sob/fatigue but no desats off pred x 04/10/14  - 04/16/2015  Walked RA x 3 laps @ 185 ft each stopped due to   - ONO RA   10/05/2014 >  02 sat < 89% 213 min so rec resume 2lpm and work on wt loss   - 04/16/2015  Walked RA x 3 laps @ 185 ft each stopped due to  91% End of study, nl pace, no sob or desat - HCO3  01/06/16 = 35    - HCO3  05/07/2016  = 39 assoc with wt gain  - HCO3 01/11/2017     = 40  - HCO3 08/16/2017     =  35 - HC03  08/27/17            = 30  - HC03  03/24/18          = 29  - 05/17/2018   Walked 3lpm POC  x one lap @ 185 stopped due to  Back pain / sats still 92 nl pace  As of  02/05/2020    02  3lpm 24/7   Also advised: Make sure you check your oxygen saturations at highest level of activity to be sure it stays over 90% and adjust upward to maintain this level if needed but remember to turn it back to previous settings when you stop (to conserve your supply).           Each maintenance medication was reviewed in detail including emphasizing most importantly the difference between maintenance and prns and under what circumstances the prns are to be triggered using an action plan format where appropriate.  Total time for H and P,  chart review, counseling, teaching device and generating customized AVS unique to this summary final  office visit / charting = 20 min

## 2020-02-07 NOTE — Assessment & Plan Note (Signed)
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 - 07/19/2013 notified pt and will return 07/24/13 for cxr/ esr then consider steroid rx (needs crypto serology also) - 07/24/2013 ESR 42 off pred x 3 weeks - 07/24/13 > crypto serology neg/ Anti-CCP neg  - started maint   prednisone 09/06/2013  >>> cxr cleared 10/18/2013 on 20 mg per day > rec taper to 20/10 - 11/28/13 reduced to 10 mg daily  - 03/07/2014 5 mg x 2 weeks then 5 mg qod x 2 weeks and stopped pred  04/10/14  - ESR 04/16/2015  = 24 / recurrent nodules > rec pred 20 mg daily > nodules resolved on f/u cxr 06/14/2015  - 04/30/2015 rec ceiling of 20 and floor of 10/5  - ESR 11/04/2015 =  23  On pred 10/5, rec taper off x 6 weeks if tol> flared in June 2017 so resumed last week in June 2017 20 ceiling/ 10/5 floor - ESR 05/07/2016   =  17 on prednisone 20 mg daily> rec 20/10 > did not tol  - ESR 11/20/2016  =  21 on pred 20/15 so rec 15 mg daily  - ESR 01/11/2017  =  46 with ? Mild  Flare on pred 5 so rec 10 until better then taper to floor of 10 /5  > did not tol - ESR 08/16/2017 =   54 but cxr / exam ok On pred 10 mg daily>  rec try 10/5 again with ceiling of 20 mg per day if worse  - ESR 02/14/2018  = 38 on 10-5-5 > stay on 5 mg daily  - ESR 05/17/2018 = 25 on 5 mg daily so rec 5/2.5 x 1 month then 2.5 mg daily  - 08/18/2018 try 2.5 mg daily  - 07/31/2019 taper off pred x  3 months  > d/c'd 10/26/19 s flare by 02/05/2020 so f/u pulmonary prn  The underlying  arthritis assoc with boop is ongoing and may well be she'll need to go back on prednisone at some point but she recognizes now the pulmonary component in terms of when to know to call so f/u can be prn here an keep up with rheum appts.

## 2020-02-08 ENCOUNTER — Ambulatory Visit: Payer: Medicare Other

## 2020-02-08 ENCOUNTER — Telehealth: Payer: Self-pay

## 2020-02-08 ENCOUNTER — Other Ambulatory Visit: Payer: Self-pay

## 2020-02-08 DIAGNOSIS — M8589 Other specified disorders of bone density and structure, multiple sites: Secondary | ICD-10-CM

## 2020-02-08 DIAGNOSIS — I10 Essential (primary) hypertension: Secondary | ICD-10-CM

## 2020-02-08 DIAGNOSIS — K219 Gastro-esophageal reflux disease without esophagitis: Secondary | ICD-10-CM

## 2020-02-08 DIAGNOSIS — I5042 Chronic combined systolic (congestive) and diastolic (congestive) heart failure: Secondary | ICD-10-CM

## 2020-02-08 NOTE — Chronic Care Management (AMB) (Deleted)
Per written referral from PCP, requesting referral in Epic for [name] to chronic care management pharmacy services for the following conditions:   Essential hypertension, benign  [I10]  GERD [K21.9]  Debbora Dus, PharmD Clinical Pharmacist [facility] at [facility] [phone]

## 2020-02-08 NOTE — Chronic Care Management (AMB) (Signed)
Chronic Care Management Pharmacy  Name: Kelli Velasquez  MRN: [id number] DOB: Apr 18, 1941  Chief Complaint/ HPI  Kelli Velasquez,  79 y.o., female presents for their Initial CCM visit with the clinical pharmacist via telephone.  PCP : Abner Greenspan, MD  Their chronic conditions include: hypertension, CAD, CHF, chronic respiratory failure, GERD, HLD, DM type 2, osteoarthritis, osteopenia, eczema, OAB, renal insufficiency, urinary incontinence, obesity, B12 deficiency, history of DVT  Reports the following medication changes: patient reports October 26, 2019 stopped chronic prednisone and cardio stopped spironolactone 2 weeks ago due to hyperkalemia, denies medication concerns  Office Visits:  11/17/19: PCP - acute cystitis, rx for bactrim  07/17/19: PCP [facility] - DM controlled, 90-120s, avoids sweets, still on prednisone 2.5 mg daily, no hypoglycemia   Consult Visit:  02/05/20: Pulmonology - cont current meds (Dr. Melvyn Novas)  10/16/19: Cardiology - cont current meds (Dr. Aundra Dubin)  Medications: Outpatient Encounter Medications as of 02/08/2020  Medication Sig  . acetaminophen (TYLENOL) 325 MG tablet Take 650 mg by mouth every 6 (six) hours as needed for mild pain. Take as needed per bottle   . albuterol (PROAIR HFA) 108 (90 Base) MCG/ACT inhaler INHALE 2 PUFFS BY MOUTH EVERY 4 HOURS AS NEEDED FOR WHEEZING OR FOR SHORTNESS OF BREATH  . aspirin 81 MG tablet Take 81 mg by mouth daily.  . bisoprolol (ZEBETA) 5 MG tablet TAKE 1.5 TABLETS (7.5 MG TOTAL) BY MOUTH DAILY.  . chlorpheniramine (CHLOR-TRIMETON) 4 MG tablet Take 4 mg by mouth 2 (two) times daily as needed for allergies.  Marland Kitchen clopidogrel (PLAVIX) 75 MG tablet TAKE 1 TABLET BY MOUTH EVERY DAY  . cyanocobalamin (,VITAMIN B-12,) 1000 MCG/ML injection Inject 1,000 mcg into the muscle every 3 (three) months.  . ENTRESTO 97-103 MG TAKE 1 TABLET BY MOUTH TWICE A DAY  . EPINEPHrine (EPIPEN 2-PAK) 0.3 mg/0.3 mL IJ SOAJ injection INJECT 0.3 MLS (0.3 MG  TOTAL) INTO THE MUSCLE ONCE AS NEEDED FOR ALLERGIC REACTION  . erythromycin ophthalmic ointment USE SMALL AMOUNT TO AFFECTED EYE AS NEEDED  . esomeprazole (NEXIUM) 40 MG capsule Take 1 capsule (40 mg total) by mouth daily.  . Evolocumab (REPATHA SURECLICK) 956 MG/ML SOAJ Inject 1 Dose into the skin every 14 (fourteen) days.  Marland Kitchen ezetimibe (ZETIA) 10 MG tablet TAKE 1 TABLET (10 MG TOTAL) BY MOUTH DAILY.  . famotidine (PEPCID) 20 MG tablet Take 40 mg by mouth daily.  . furosemide (LASIX) 20 MG tablet TAKE 1 TABLET BY MOUTH EVERY OTHER DAY  . insulin glargine (LANTUS) 100 UNIT/ML injection INJECT 22 UNITS INTO THE SKIN DAILY. (VIAL EXPIRES 28 DAYS AFTER FIRST USE)  . methocarbamol (ROBAXIN) 500 MG tablet Take 1 tablet (500 mg total) by mouth every 8 (eight) hours as needed for muscle spasms.  . mometasone (ELOCON) 0.1 % cream Apply 1 application topically daily.  . OXYGEN 24/7 2 lpm  [employer]   No facility-administered encounter medications on file as of 02/08/2020.   Current Diagnosis/Assessment:  SDOH Interventions     Most Recent Value  SDOH Interventions  Financial Strain Interventions Intervention Not Indicated     Goals    . lose weight     Starting 07/06/2018, I will continue to practice portion control in an effort to lose weight.     . Pharmacy Care Plan     CARE PLAN ENTRY  Current Barriers:  . Chronic Disease Management support, education, and care coordination needs related to Hypertension, Heart Failure, and Osteopenia  Hypertension/Heart failure BP Readings from Last 3 Encounters:  02/05/20 124/74  11/17/19 136/68  10/16/19 114/70 .  Pharmacist Clinical Goal(s): o Over the next 6 months, patient will work with PharmD and providers to maintain BP goal <140/90 mmHg . Current regimen:  o Bisoprolol 5 mg - 1 and 1/2 tablets daily o Entresto 97-103 mg - 1 tablet twice daily o Furosemide 20 mg - 1 tablet every other day . Interventions: o Comprehensive medication  review . Patient self care activities - Over the next 6 months, patient will: o Continue to check BP daily, document, and provide at future appointments o Check weight daily o Ensure daily salt intake < 2300 mg/day  Osteopenia  . Pharmacist Clinical Goal(s) o Over the next 6 months, patient will work with PharmD and providers to reduce risk of fracture  . Current regimen:  o No pharmacotherapy . Interventions: o Recommend calcium and vitamin D supplementation . Patient self care activities - Over the next 6 months, patient will: o Ensure an adequate intake of dietary calcium (1200 mg/d) and vitamin D (800 IU daily).  Initial goal documentation       Hypertension   CMP Latest Ref Rng & Units 01/26/2020 01/16/2020 10/16/2019  Glucose 70 - 99 mg/dL 96 105(H) 120(H)  BUN 8 - 23 mg/dL 33(H) 32(H) 26(H)  Creatinine 0.44 - 1.00 mg/dL 1.41(H) 1.35(H) 1.22(H)  Sodium 135 - 145 mmol/L 142 140 140  Potassium 3.5 - 5.1 mmol/L 4.9 5.4(H) 5.3(H)  Chloride 98 - 111 mmol/L 108 105 107  CO2 22 - 32 mmol/L 28 27 27   Calcium 8.9 - 10.3 mg/dL 9.1 9.2 9.2  Total Protein 6.0 - 8.3 g/dL - - -  Total Bilirubin 0.2 - 1.2 mg/dL - - -  Alkaline Phos 39 - 117 U/L - - -  AST 0 - 37 U/L - - -  ALT 0 - 35 U/L - - -   Office blood pressures are: BP Readings from Last 3 Encounters:  02/05/20 124/74  11/17/19 136/68  10/16/19 114/70   Patient has failed these meds in the past: none reported Patient checks BP at home daily Patient home BP readings are ranging: 120/70s  Patient is currently controlled on the following medications:   Bisoprolol 5 mg - 1 and 1/2 tablets daily  Plan: Continue current medications  Hyperlipidemia/CAD   LDL goal < 70 Lipid Panel     Component Value Date/Time   CHOL 116 07/10/2019 0929   CHOL 109 06/27/2019 0936   TRIG 190.0 (H) 07/10/2019 0929   HDL 50.70 07/10/2019 0929   HDL 45 06/27/2019 0936   LDLCALC 28 07/10/2019 0929   LDLCALC 32 06/27/2019 0936    LDLDIRECT 121.0 07/06/2018 1216    CBC Latest Ref Rng & Units 07/10/2019 07/06/2018 08/27/2017  WBC 4.0 - 10.5 K/uL 11.2(H) 13.0(H) 19.7(H)  Hemoglobin 12.0 - 15.0 g/dL 12.1 11.6(L) 12.3  Hematocrit 36 - 46 % 37.7 35.7(L) 40.1  Platelets 150 - 400 K/uL 264.0 261.0 224    The ASCVD Risk score Mikey Bussing DC Jr., et al., 2013) failed to calculate for the following reasons:   The patient has a prior MI or stroke diagnosis   Patient has failed these meds in past: statins Patient is currently controlled on the following medications:  . Aspirin 81 mg - 1 tablet daily . Clopidogrel 75 mg - 1 tablet daily . Repatha 140 mg - Inject every 14 days  . Ezetimibe 10 mg - 1 tablet  daily   We discussed:  DES to [facility] in 11/18 - DAPT per cardio, CBC stable; husband helps inject Repatha if needed; adherence confirmed, refills timely   Plan: Continue current medications  Diabetes   Recent Relevant Labs: Lab Results  Component Value Date/Time   HGBA1C 5.5 07/10/2019 09:29 AM   HGBA1C 5.4 07/06/2018 12:16 PM    Checking BG: Daily (rotates between fasting and 2 hours after meals) Recent FBG Readings: 90-100 Recent post prandial readings: highest 177 after meal Denies any hypoglycemia (< 70 mg/dL)  A1c goal < 7% Patient has failed these meds in past: none  Patient is currently controlled on the following medications:   Lantus - Inject 22 units daily at bedtime  We discussed: Patient has been off prednisone since April 2021. Still on same dose of Lantus. Denies any hypoglycemia or s/s of hypoglycemia.   Plan: Continue current medications  Heart Failure   Type: Combined Systolic and Diastolic  Last ejection fraction: 10/16/2019 showed EF 55-60% NYHA Class: II (slight limitation of activity) AHA HF Stage: C (Heart disease and symptoms present)  Patient has failed these meds in past: spironolactone (hyperkalemia, dizziness) Patient is currently controlled on the following medications:   Entresto  97-103 mg - 1 tablet BID  Bisoprolol 5 mg - 1 and 1/2 tablets daily  Furosemide 20 mg - 1 tablet every other day   We discussed: checks weight daily, denies weight fluctuations, denies swelling, reports SOB is stable, has SOB with normal household chores   Reports losing weight since off prednisone   Plan: Continue current medications   Chronic Respiratory Failure/BOOP   Patient has failed these meds in past: reports tried every allergy pill available  Patient is currently controlled on the following medications:   Albuterol HFA 90 mcg - 2 puffs q4h PRN  Chlorpheniramine 4 mg - 2 tablets BID, confirms taking every day, denies drowsiness   Supplemental oxygen - wears 24/7  Rescue inhaler: uses very seldom, once in past 3-6 months  We discussed: checks oxygen daily - usually 95%, goal per pulmonology is > 90%  Plan: Continue current medications  GERD   Patient has failed these meds in past: none  Patient is currently controlled on the following medications:   Esomeprazole 40 mg - 1 tablet daily 1 hour before breakfast  Famotidine 20 mg (Pepcid) - 2 tablets daily every evening   Symptoms: denies heartburn or breakthrough symptoms  Duration: reports PPI > 5 years   Plan: Continue current medications; Recommend considering taper off esomeprazole.   B12 Deficiency    B12: 07/10/19 522 Patient is currently controlled on the following medications:   Vitamin B12 1000 mcg - inject every 3 months   We discussed: no concerns   Plan: Continue current medications   Osteopenia   DEXA 03/31/2017: Femur Total Left T-score of -2.1 FRAX: The probability of a major osteoporotic fracture is 31.7 % within the next ten years. The probability of a hip fracture is 18.3 % within the next ten years.   Patient has failed these meds in past: Prolia - fish allergy, Alendronate - GI intolerance Patient is currently uncontrolled on the following medications:   No pharmacotherapy  We  discussed: wheelchair bound, ensure an adequate intake of dietary calcium (1200 mg/d) and vitamin D (800 IU daily), risks with PPI; pt is not interested in additional therapy  Plan: Ensure an adequate intake of dietary calcium (1200 mg/d) and vitamin D (800 IU daily).  Osteoarthritis  Patient is currently controlled on the following medications:   Methocarbamol 500 mg - 1 tablet q8h PRN muscle spasm (takes PRN)  Tylenol Arthritis - 1 tablet q8h PRN arthritis (takes daily, up to TID)   We discussed: reports arthritis has worsened since stopping prednisone, primarily in shoulder, hip joints, also has back pain due to degenerative disc, able to walk around the house but going to grocery store has to use electric cart   Plan: Continue current medications   Medication Management:  Misc Meds: Mometasone cream - applys to ears for irritation from oxygen straps   OTCs: denies any vitamins/supplements; uses essential oils by diffuser daily  Pharmacy: [facility]  Adherence: < 5 day gap refills, auto-refill  Social support: husband is supportive   Affordability: denies cost concerns, in coverage gap now, but able to afford   CCM Follow Up: 6 months, telephone  Debbora Dus, PharmD Clinical Pharmacist [facility] at [facility] [phone]

## 2020-02-08 NOTE — Telephone Encounter (Signed)
Per written referral from PCP, requesting referral in Epic for [name] to chronic care management pharmacy services for the following conditions:   Essential hypertension, benign  [I10]  GERD [K21.9]  Debbora Dus, PharmD Clinical Pharmacist [facility] at [facility] [phone]

## 2020-02-09 NOTE — Telephone Encounter (Signed)
Referral placed.

## 2020-02-13 ENCOUNTER — Other Ambulatory Visit (HOSPITAL_COMMUNITY): Payer: Self-pay | Admitting: Cardiology

## 2020-02-13 DIAGNOSIS — R0902 Hypoxemia: Secondary | ICD-10-CM | POA: Diagnosis not present

## 2020-02-20 ENCOUNTER — Telehealth: Payer: Self-pay | Admitting: *Deleted

## 2020-02-20 NOTE — Telephone Encounter (Signed)
Patient called stating that she has another UTI. Patient stated that she started yesterday with pain and burning when urinating. Patient stated that she has been taking AZO. Patient requested that an antibiotic be called in for her because she can not make it to the office today. Advised patient that we would need to check her urine and do a culture to make sure that she is put on the right medication for a UTI. Patient scheduled to see Dr. Glori Bickers tomorrow 02/21/20. Patient was given UC/ER precautions and she verbalized understanding.

## 2020-02-20 NOTE — Telephone Encounter (Signed)
I will see her then  

## 2020-02-21 ENCOUNTER — Other Ambulatory Visit: Payer: Self-pay

## 2020-02-21 ENCOUNTER — Encounter: Payer: Self-pay | Admitting: Family Medicine

## 2020-02-21 ENCOUNTER — Ambulatory Visit (INDEPENDENT_AMBULATORY_CARE_PROVIDER_SITE_OTHER): Payer: Medicare Other | Admitting: Family Medicine

## 2020-02-21 VITALS — BP 128/74 | HR 75 | Temp 96.9°F | Ht 65.5 in

## 2020-02-21 DIAGNOSIS — N3001 Acute cystitis with hematuria: Secondary | ICD-10-CM

## 2020-02-21 DIAGNOSIS — R300 Dysuria: Secondary | ICD-10-CM

## 2020-02-21 DIAGNOSIS — N289 Disorder of kidney and ureter, unspecified: Secondary | ICD-10-CM

## 2020-02-21 LAB — POC URINALSYSI DIPSTICK (AUTOMATED)
Blood, UA: 200
Spec Grav, UA: 1.025 (ref 1.010–1.025)
pH, UA: 5 (ref 5.0–8.0)

## 2020-02-21 MED ORDER — NITROFURANTOIN MONOHYD MACRO 100 MG PO CAPS: 100.0000 mg | ORAL_CAPSULE | Freq: Two times a day (BID) | ORAL | 0 refills | Status: DC

## 2020-02-21 NOTE — Assessment & Plan Note (Signed)
Pt has uti  Unable to take keflex  Short course of macrobid px  She can inc water w/in fluid restriction of cardiol Continue to monitor

## 2020-02-21 NOTE — Progress Notes (Signed)
Subjective:    Patient ID: Kelli Velasquez, female    DOB: [birth year], 79 y.o.   MRN: [id number]  This visit occurred during the SARS-CoV-2 public health emergency.  Safety protocols were in place, including screening questions prior to the visit, additional usage of staff PPE, and extensive cleaning of exam room while observing appropriate contact time as indicated for disinfecting solutions.    HPI Pt presents with urinary symptoms   Wt Readings from Last 3 Encounters:  02/05/20 218 lb 6.4 oz (99.1 kg)  07/31/19 237 lb 12.8 oz (107.9 kg)  07/17/19 237 lb 6.4 oz (107.7 kg)  declined weight today At home-216 lb  35.79 kg/m   Dysuria  Frequency (more than baseline)  Symptoms started day before yesterday  Burning started first  No bladder pain   No blood in urine (before azo) No change in urine odor   No fever  No nausea  No flank pain    She did use azo   Ua pos for blood  Not enough to spin   Results for orders placed or performed in visit on 02/21/20  POCT Urinalysis Dipstick (Automated)  Result Value Ref Range   Color, UA Orange    Clarity, UA Cloudy    Glucose, UA     Bilirubin, UA     Ketones, UA     Spec Grav, UA 1.025 1.010 - 1.025   Blood, UA 200 Ery/uL    pH, UA 5.0 5.0 - 8.0   Protein, UA     Urobilinogen, UA     Nitrite, UA     Leukocytes, UA     she has a water restriction due to CHF     Last urine culture in April was pos for e coli resistant to sulfa   She has h/o renal insuff (and limited fluid intake from cardiology)-no more than 2 L Drinks water and green tea  Started drinking some diet cranberry juice  Lab Results  Component Value Date   CREATININE 1.41 (H) 01/26/2020   BUN 33 (H) 01/26/2020   NA 142 01/26/2020   K 4.9 01/26/2020   CL 108 01/26/2020   CO2 28 01/26/2020   Patient Active Problem List   Diagnosis Date Noted  . Acute cystitis 11/17/2019  . Renal insufficiency 07/17/2019  . History of arm fracture 03/21/2019  .  Low back pain 08/23/2018  . Eczema 07/08/2018  . Hoarseness of voice 01/03/2018  . Coronary artery disease 07/07/2017  . Chronic systolic heart failure ([place]) 07/07/2017  . Abnormal urinalysis 07/07/2017  . History of DVT (deep vein thrombosis) 07/07/2017  . Diabetes mellitus type 2 in obese ([place]) 07/07/2017  . 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 Hypoxemia   . Encounter for screening mammogram for breast cancer 02/02/2017  . Chronic combined systolic and diastolic CHF, NYHA class 2 ([place]) 02/02/2017  . Depressed mood 12/02/2016  . Routine general medical examination at a health care facility 12/30/2015  . Supraumbilical hernia 32/20/2542  . Lump in the abdomen 04/10/2015  . GERD (gastroesophageal reflux disease) 10/10/2014  . B12 deficiency 10/09/2014  . Cataracts, bilateral 10/09/2014  . Dry eyes 10/09/2014  . Fatigue 05/08/2014  . Pedal edema 05/08/2014  . Osteopenia 11/28/2013  . Estrogen deficiency 11/02/2013  . Dyspnea on exertion 06/27/2013  . Hernia, incisional 04/03/2013  . 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 Cough 12/18/2012  . Chronic respiratory failure with hypoxia and hypercapnia ([place]) 10/26/2012  .  Special screening for malignant neoplasms, colon 06/16/2011  . CAD, NATIVE VESSEL 08/19/2009  . 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 Morbid obesity due to excess calories ([place]) comp by hbp/ hyperlipidemia/ dm/ihd 09/01/2007  . MYOPIA 03/03/2007  . Hyperlipidemia associated with type 2 diabetes mellitus ([place]) 10/15/2006  . Essential hypertension 10/15/2006  . Allergic rhinitis 10/15/2006  . Cough variant asthma vs UACS  10/15/2006  . OVERACTIVE BLADDER 10/15/2006  . OSTEOARTHRITIS 10/15/2006  . Urinary incontinence 10/15/2006   Past Medical History:  Diagnosis Date  . Allergy    allergic rhinitis  . Asthma   . Chronic bronchitis ([place])   . Coronary artery disease    cath January 2011 with  DEs LAD and RCA  . Dyspnea   . Full dentures   . Gallstones   . GERD (gastroesophageal reflux disease)   . History of echocardiogram    Echo 5/17: mod LVH, EF 50-55%, ant-septal HK, Gr 1 DD, mod LAE //  b.  Echo 7/17: mild concentric LVH, EF 45-50%, inf-lat, inf, inf-septal HK, mild LAE  . History of nuclear stress test    Myoview 7/17: EF 48%, small mild apical defect, no ischemia, low risk  . Hyperlipidemia   . Hypertension   . Myocardial infarction ([place])    subendocardial, initial episode, 2010 two stents placed  . Myopia   . 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 Nocturnal oxygen desaturation    o2 at night  . Obesity   . Osteoarthritis    knees, fingers, shoulders  . Overactive bladder   . Oxygen deficiency    uses oxygen all day  . Rash    and other non specific skin eruptions  . Retaining fluid    in ankles and feet  . Urinary incontinence   . Vertigo   . Wears glasses    Past Surgical History:  Procedure Laterality Date  . CATARACT EXTRACTION W/PHACO Right 12/16/2016   Procedure: CATARACT EXTRACTION PHACO AND INTRAOCULAR LENS PLACEMENT ([place])  right;  Surgeon: Leandrew Koyanagi, MD;  Location: [place];  Service: Ophthalmology;  Laterality: Right;  . CATARACT EXTRACTION W/PHACO Left 02/03/2017   Procedure: CATARACT EXTRACTION PHACO AND INTRAOCULAR LENS PLACEMENT ([place])  Left  Complicated;  Surgeon: Leandrew Koyanagi, MD;  Location: [facility];  Service: Ophthalmology;  Laterality: Left;  [place] Uses oxygen   . CHOLECYSTECTOMY  92  . COLONOSCOPY    . CORONARY ANGIOGRAPHY N/A 06/16/2017   Procedure: CORONARY ANGIOGRAPHY;  Surgeon: Larey Dresser, MD;  Location: [facility];  Service: Cardiovascular;  Laterality: N/A;  . CORONARY ANGIOPLASTY WITH STENT PLACEMENT  07/2009   stent  . CORONARY STENT INTERVENTION N/A 06/16/2017   Procedure: CORONARY STENT INTERVENTION;  Surgeon: Wellington Hampshire, MD;  Location:  [facility];  Service: Cardiovascular;  Laterality: N/A;  . DILATION AND CURETTAGE OF UTERUS  1984  . INCISIONAL HERNIA REPAIR N/A 05/16/2013   Procedure: HERNIA REPAIR INFRAUMILICAL INCISIONAL;  Surgeon: Joyice Faster. Cornett, MD;  Location: [facility];  Service: General;  Laterality: N/A;  umbilical  . INSERTION OF MESH N/A 05/16/2013   Procedure: INSERTION OF MESH;  Surgeon: Joyice Faster. Cornett, MD;  Location: [facility];  Service: General;  Laterality: N/A;  umbilical  . JOINT REPLACEMENT  2007   right total knee replacement  . RIGHT/LEFT HEART CATH AND CORONARY ANGIOGRAPHY N/A 06/16/2017   Procedure: RIGHT/LEFT HEART CATH  AND CORONARY ANGIOGRAPHY;  Surgeon: Larey Dresser, MD;  Location: [facility];  Service: Cardiovascular;  Laterality: N/A;  . TOTAL KNEE ARTHROPLASTY  2010   left , then vocal cord infection post op  . VIDEO BRONCHOSCOPY Bilateral 07/05/2013   Procedure: VIDEO BRONCHOSCOPY WITH FLUORO;  Surgeon: Tanda Rockers, MD;  Location: [facility];  Service: Cardiopulmonary;  Laterality: Bilateral;  . vocal cord polypectomy     Social History   Tobacco Use  . Smoking status: Former Smoker    Packs/day: 1.00    Years: 25.00    Pack years: 25.00    Types: Cigarettes    Quit date: 07/27/1982    Years since quitting: 37.5  . Smokeless tobacco: Never Used  Vaping Use  . Vaping Use: Never used  Substance Use Topics  . Alcohol use: Yes    Alcohol/week: 0.0 standard drinks    Comment: wine-rare  . Drug use: No   Family History  Problem Relation Age of Onset  . Stroke Mother   . Diabetes Mother 78  . Stroke Father   . Heart failure Father   . Breast cancer Maternal Grandmother   . Colon cancer Neg Hx    Allergies  Allergen Reactions  . Fish Allergy Anaphylaxis  . Shellfish Allergy Anaphylaxis  . Aspirin     REACTION: nausea and vomiting High doses  . Atorvastatin     REACTION: leg pain  . Crestor [Rosuvastatin Calcium]  Other (See Comments)    Muscle pain - allergy/intolerance  . Penicillins     Due to mold allergy per pt  . Simvastatin     REACTION: muscle pain  . Trandolapril     REACTION: leg pain   Current Outpatient Medications on File Prior to Visit  Medication Sig Dispense Refill  . acetaminophen (TYLENOL) 325 MG tablet Take 650 mg by mouth every 6 (six) hours as needed for mild pain. Take as needed per bottle     . albuterol (PROAIR HFA) 108 (90 Base) MCG/ACT inhaler INHALE 2 PUFFS BY MOUTH EVERY 4 HOURS AS NEEDED FOR WHEEZING OR FOR SHORTNESS OF BREATH 8 Inhaler 5  . aspirin 81 MG tablet Take 81 mg by mouth daily.    . bisoprolol (ZEBETA) 5 MG tablet TAKE 1.5 TABLETS (7.5 MG TOTAL) BY MOUTH DAILY. 135 tablet 2  . chlorpheniramine (CHLOR-TRIMETON) 4 MG tablet Take 4 mg by mouth 2 (two) times daily as needed for allergies.    Marland Kitchen clopidogrel (PLAVIX) 75 MG tablet TAKE 1 TABLET BY MOUTH EVERY DAY 90 tablet 1  . cyanocobalamin (,VITAMIN B-12,) 1000 MCG/ML injection Inject 1,000 mcg into the muscle every 3 (three) months.    . ENTRESTO 97-103 MG TAKE 1 TABLET BY MOUTH TWICE A DAY 60 tablet 6  . EPINEPHrine (EPIPEN 2-PAK) 0.3 mg/0.3 mL IJ SOAJ injection INJECT 0.3 MLS (0.3 MG TOTAL) INTO THE MUSCLE ONCE AS NEEDED FOR ALLERGIC REACTION 2 Device 1  . erythromycin ophthalmic ointment USE SMALL AMOUNT TO AFFECTED EYE AS NEEDED    . esomeprazole (NEXIUM) 40 MG capsule Take 1 capsule (40 mg total) by mouth daily. 90 capsule 3  . Evolocumab (REPATHA SURECLICK) 449 MG/ML SOAJ Inject 1 Dose into the skin every 14 (fourteen) days. 2 pen 11  . ezetimibe (ZETIA) 10 MG tablet TAKE 1 TABLET (10 MG TOTAL) BY MOUTH DAILY. 90 tablet 3  . famotidine (PEPCID) 20 MG tablet Take 40 mg by mouth daily.    . furosemide (LASIX)  20 MG tablet TAKE 1 TABLET BY MOUTH EVERY OTHER DAY 45 tablet 1  . insulin glargine (LANTUS) 100 UNIT/ML injection INJECT 22 UNITS INTO THE SKIN DAILY. (VIAL EXPIRES 28 DAYS AFTER FIRST USE) 30 mL 1  .  methocarbamol (ROBAXIN) 500 MG tablet Take 1 tablet (500 mg total) by mouth every 8 (eight) hours as needed for muscle spasms. 90 tablet 0  . mometasone (ELOCON) 0.1 % cream Apply 1 application topically daily. 15 g 3  . OXYGEN 24/7 2 lpm  [employer]     No current facility-administered medications on file prior to visit.     Review of Systems  Constitutional: Positive for fatigue. Negative for activity change, appetite change and fever.  HENT: Negative for congestion and sore throat.   Eyes: Negative for itching and visual disturbance.  Respiratory: Negative for cough and shortness of breath.   Cardiovascular: Negative for leg swelling.  Gastrointestinal: Negative for abdominal distention, abdominal pain, constipation, diarrhea and nausea.  Endocrine: Negative for cold intolerance and polydipsia.  Genitourinary: Positive for dysuria, frequency and urgency. Negative for decreased urine volume, difficulty urinating, flank pain, hematuria and pelvic pain.       Frequency is also baseline  Musculoskeletal: Negative for myalgias.  Skin: Negative for rash.  Allergic/Immunologic: Negative for immunocompromised state.  Neurological: Negative for dizziness and weakness.  Hematological: Negative for adenopathy.       Objective:   Physical Exam Constitutional:      General: She is not in acute distress.    Appearance: Normal appearance. She is well-developed. She is obese. She is not ill-appearing.  HENT:     Head: Normocephalic and atraumatic.  Eyes:     Conjunctiva/sclera: Conjunctivae normal.     Pupils: Pupils are equal, round, and reactive to light.  Cardiovascular:     Rate and Rhythm: Normal rate and regular rhythm.     Heart sounds: Normal heart sounds.  Pulmonary:     Effort: Pulmonary effort is normal.     Breath sounds: Normal breath sounds.     Comments: Distant bs  Wearing 02 per   Baseline sob with exertion  Abdominal:     General: Bowel sounds are normal. There is no  distension.     Palpations: Abdomen is soft.     Tenderness: There is no abdominal tenderness. There is no rebound.     Comments: No cva tenderness No suprapubic tenderness or fullness    Musculoskeletal:     Cervical back: Normal range of motion and neck supple.     Right lower leg: No edema.     Left lower leg: No edema.  Lymphadenopathy:     Cervical: No cervical adenopathy.  Skin:    Findings: No rash.  Neurological:     Mental Status: She is alert.  Psychiatric:        Mood and Affect: Mood normal.           Assessment & Plan:   Problem List Items Addressed This Visit      Genitourinary   Renal insufficiency    Pt has uti  Unable to take keflex  Short course of macrobid px  She can inc water w/in fluid restriction of cardiol Continue to monitor      Acute cystitis - Primary    In pt with h/o renal insufficiency and also fluid restriction due to CHF  Dysuria  Azo has helped  Dip pos for blood  cx pending (not enough sample to  spin) She can inc water a little (staying in guidelines from cardiology) tx with macrobid (no keflex due to pcn allergy and last cx resistant to sulfa)  Pending cx inst to alert [place] if symptoms worsen or change       Relevant Orders   Urine Culture    Other Visit Diagnoses    Dysuria       Relevant Orders   POCT Urinalysis Dipstick (Automated) (Completed)

## 2020-02-21 NOTE — Patient Instructions (Addendum)
Increase water a bit (but not above 2 L)  Take the macrobid as directed for uti  We will call when urine culture returns and change treatment only if needed   If symptoms change in the meantime please let us know

## 2020-02-21 NOTE — Assessment & Plan Note (Signed)
In pt with h/o renal insufficiency and also fluid restriction due to CHF  Dysuria  Azo has helped  Dip pos for blood  cx pending (not enough sample to spin) She can inc water a little (staying in guidelines from cardiology) tx with macrobid (no keflex due to pcn allergy and last cx resistant to sulfa)  Pending cx inst to alert [place] if symptoms worsen or change

## 2020-02-23 LAB — URINE CULTURE
MICRO NUMBER:: 10759982
SPECIMEN QUALITY:: ADEQUATE

## 2020-02-25 NOTE — Patient Instructions (Addendum)
Dear Kelli Velasquez,  It was a pleasure meeting you during our initial appointment on February 08, 2020. Below is a summary of the goals we discussed and components of chronic care management. Please contact me anytime with questions or concerns.   Visit Information  Goals Addressed            This Visit's Progress   . Pharmacy Care Plan       CARE PLAN ENTRY  Current Barriers:  . Chronic Disease Management support, education, and care coordination needs related to Hypertension, Heart Failure, and Osteopenia   Hypertension/Heart failure BP Readings from Last 3 Encounters:  02/05/20 124/74  11/17/19 136/68  10/16/19 114/70 .  Pharmacist Clinical Goal(s): o Over the next 6 months, patient will work with PharmD and providers to maintain BP goal <140/90 mmHg . Current regimen:  o Bisoprolol 5 mg - 1 and 1/2 tablets daily o Entresto 97-103 mg - 1 tablet twice daily o Furosemide 20 mg - 1 tablet every other day . Interventions: o Comprehensive medication review . Patient self care activities - Over the next 6 months, patient will: o Continue to check BP daily, document, and provide at future appointments o Check weight daily o Ensure daily salt intake < 2300 mg/day  Osteopenia  . Pharmacist Clinical Goal(s) o Over the next 6 months, patient will work with PharmD and providers to reduce risk of fracture  . Current regimen:  o No pharmacotherapy . Interventions: o Recommend calcium and vitamin D supplementation . Patient self care activities - Over the next 6 months, patient will: o Ensure an adequate intake of dietary calcium (1200 mg/d) and vitamin D (800 IU daily).  Initial goal documentation       Kelli Velasquez was given information about Chronic Care Management services today including:  1. CCM service includes personalized support from designated clinical staff supervised by her physician, including individualized plan of care and coordination with other care  providers 2. 24/7 contact phone numbers for assistance for urgent and routine care needs. 3. Standard insurance, coinsurance, copays and deductibles apply for chronic care management only during months in which we provide at least 20 minutes of these services. Most insurances cover these services at 100%, however patients may be responsible for any copay, coinsurance and/or deductible if applicable. This service may help you avoid the need for more expensive face-to-face services. 4. Only one practitioner may furnish and bill the service in a calendar month. 5. The patient may stop CCM services at any time (effective at the end of the month) by phone call to the office staff.  Patient agreed to services and verbal consent obtained.   The patient verbalized understanding of instructions provided today and agreed to receive a mailed copy of patient instruction and/or educational materials. Telephone follow up appointment with pharmacy team member scheduled for: August 14, 2020 at 1 PM (telephone visit)  Debbora Dus, PharmD Clinical Pharmacist [facility] [phone]  Osteopenia  Osteopenia is a loss of thickness (density) inside of the bones. Another name for osteopenia is low bone mass. Mild osteopenia is a normal part of aging. It is not a disease, and it does not cause symptoms. However, if you have osteopenia and continue to lose bone mass, you could develop a condition that causes the bones to become thin and break more easily (osteoporosis). You may also lose some height, have back pain, and have a stooped posture. Although osteopenia is not a disease,  making changes to your lifestyle and diet can help to prevent osteopenia from developing into osteoporosis. What are the causes? Osteopenia is caused by loss of calcium in the bones.  Bones are constantly changing. Old bone cells are continually being replaced with new bone cells. This process builds new bone.  The mineral calcium is needed to build new bone and maintain bone density. Bone density is usually highest around age 29. After that, most people's bodies cannot replace all the bone they have lost with new bone. What increases the risk? You are more likely to develop this condition if:  You are older than age 52.  You are a woman who went through menopause early.  You have a long illness that keeps you in bed.  You do not get enough exercise.  You lack certain nutrients (malnutrition).  You have an overactive thyroid gland (hyperthyroidism).  You smoke.  You drink a lot of alcohol.  You are taking medicines that weaken the bones, such as steroids. What are the signs or symptoms? This condition does not cause any symptoms. You may have a slightly higher risk for bone breaks (fractures), so getting fractures more easily than normal may be an indication of osteopenia. How is this diagnosed? Your health care provider can diagnose this condition with a special type of X-ray exam that measures bone density (dual-energy X-ray absorptiometry, DEXA). This test can measure bone density in your hips, spine, and wrists. Osteopenia has no symptoms, so this condition is usually diagnosed after a routine bone density screening test is done for osteoporosis. This routine screening is usually done for:  Women who are age 42 or older.  Men who are age 36 or older. If you have risk factors for osteopenia, you may have the screening test at an earlier age. How is this treated? Making dietary and lifestyle changes can lower your risk for osteoporosis. If you have severe osteopenia that is close to becoming osteoporosis, your health care provider may prescribe medicines and dietary supplements such as calcium and vitamin D. These supplements help to rebuild bone density. Follow these instructions at home:   Take over-the-counter and prescription medicines only as told by your health care provider.  These include vitamins and supplements.  Eat a diet that is high in calcium and vitamin D. ? Calcium is found in dairy products, beans, salmon, and leafy green vegetables like spinach and broccoli. ? Look for foods that have vitamin D and calcium added to them (fortified foods), such as orange juice, cereal, and bread.  Do 30 or more minutes of a weight-bearing exercise every day, such as walking, jogging, or playing a sport. These types of exercises strengthen the bones.  Take precautions at home to lower your risk of falling, such as: ? Keeping rooms well-lit and free of clutter, such as cords. ? Installing safety rails on stairs. ? Using rubber mats in the bathroom or other areas that are often wet or slippery.  Do not use any products that contain nicotine or tobacco, such as cigarettes and e-cigarettes. If you need help quitting, ask your health care provider.  Avoid alcohol or limit alcohol intake to no more than 1 drink a day for nonpregnant women and 2 drinks a day for men. One drink equals 12 oz of beer, 5 oz of wine, or 1 oz of hard liquor.  Keep all follow-up visits as told by your health care provider. This is important. Contact a health care provider if:  You  have not had a bone density screening for osteoporosis and you are: ? A woman, age 68 or older. ? A man, age 35 or older.  You are a postmenopausal woman who has not had a bone density screening for osteoporosis.  You are older than age 68 and you want to know if you should have bone density screening for osteoporosis. Summary  Osteopenia is a loss of thickness (density) inside of the bones. Another name for osteopenia is low bone mass.  Osteopenia is not a disease, but it may increase your risk for a condition that causes the bones to become thin and break more easily (osteoporosis).  You may be at risk for osteopenia if you are older than age 82 or if you are a woman who went through early  menopause.  Osteopenia does not cause any symptoms, but it can be diagnosed with a bone density screening test.  Dietary and lifestyle changes are the first treatment for osteopenia. These may lower your risk for osteoporosis. This information is not intended to replace advice given to you by your health care provider. Make sure you discuss any questions you have with your health care provider. Document Revised: 06/25/2017 Document Reviewed: 04/21/2017 Elsevier Patient Education  2020 [employer].

## 2020-02-26 NOTE — Progress Notes (Signed)
I have collaborated with the care management provider regarding care management and care coordination activities outlined in this encounter and have reviewed this encounter including documentation in the note and care plan. I am certifying that I agree with the content of this note and encounter as supervising physician. Merilyn Pagan MD  

## 2020-03-01 ENCOUNTER — Telehealth: Payer: Self-pay

## 2020-03-01 ENCOUNTER — Ambulatory Visit: Payer: Medicare Other | Admitting: Internal Medicine

## 2020-03-01 MED ORDER — SULFAMETHOXAZOLE-TRIMETHOPRIM 800-160 MG PO TABS: 1.0000 | ORAL_TABLET | Freq: Two times a day (BID) | ORAL | 0 refills | Status: DC

## 2020-03-01 NOTE — Telephone Encounter (Signed)
Pt seen 02/21/20 and pt finished macrobid first of this wk and pt said for 1st 2 days pt thought all symptoms were gone and starting late last night started with some burning upon urination. No frequency more than usual since taking diuretic. No abd or back pain and no fever. Pt request abx to get rid of burning upon urination sensation because pt knows if do not get abx symptoms will just worsen. [facility].

## 2020-03-01 NOTE — Telephone Encounter (Signed)
Pt notified Rx sent to pharmacy. Pt advised of Dr. Marliss Coots comments

## 2020-03-01 NOTE — Telephone Encounter (Signed)
I sent bactrim to her pharmacy  Let us know if no improvement  Thanks

## 2020-03-05 ENCOUNTER — Telehealth: Payer: Self-pay | Admitting: *Deleted

## 2020-03-05 MED ORDER — CLOTRIMAZOLE-BETAMETHASONE 1-0.05 % EX CREA: 1.0000 "application " | TOPICAL_CREAM | Freq: Two times a day (BID) | CUTANEOUS | 1 refills | Status: DC | PRN

## 2020-03-05 NOTE — Telephone Encounter (Signed)
I sent it  

## 2020-03-05 NOTE — Telephone Encounter (Signed)
Patient called stating that she tried to get a refill on clotrimazole betamethasone 1%-0.05% and was advised by the pharmacy they don't have this on her profile. Patient stated that she needs a new script sent in for this. 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 Pharmacy [facility]

## 2020-03-05 NOTE — Telephone Encounter (Signed)
Pt notified Rx sent 

## 2020-03-15 DIAGNOSIS — R0902 Hypoxemia: Secondary | ICD-10-CM | POA: Diagnosis not present

## 2020-03-18 DIAGNOSIS — H10503 Unspecified blepharoconjunctivitis, bilateral: Secondary | ICD-10-CM | POA: Diagnosis not present

## 2020-04-03 ENCOUNTER — Other Ambulatory Visit (HOSPITAL_COMMUNITY): Payer: Self-pay | Admitting: Cardiology

## 2020-04-15 DIAGNOSIS — R0902 Hypoxemia: Secondary | ICD-10-CM | POA: Diagnosis not present

## 2020-04-29 DIAGNOSIS — H10503 Unspecified blepharoconjunctivitis, bilateral: Secondary | ICD-10-CM | POA: Diagnosis not present

## 2020-05-01 ENCOUNTER — Telehealth (HOSPITAL_COMMUNITY): Payer: Self-pay | Admitting: *Deleted

## 2020-05-01 ENCOUNTER — Ambulatory Visit (INDEPENDENT_AMBULATORY_CARE_PROVIDER_SITE_OTHER): Payer: Medicare Other

## 2020-05-01 ENCOUNTER — Other Ambulatory Visit: Payer: Self-pay

## 2020-05-01 DIAGNOSIS — Z23 Encounter for immunization: Secondary | ICD-10-CM | POA: Diagnosis not present

## 2020-05-01 DIAGNOSIS — E538 Deficiency of other specified B group vitamins: Secondary | ICD-10-CM | POA: Diagnosis not present

## 2020-05-01 MED ORDER — CYANOCOBALAMIN 1000 MCG/ML IJ SOLN
1000.0000 ug | Freq: Once | INTRAMUSCULAR | Status: AC
  Administered 2020-05-01: 1000 ug via INTRAMUSCULAR

## 2020-05-01 NOTE — Telephone Encounter (Signed)
Pt left VM stating she was prescribed acyclovir 200mg  two capsules five times a day for an eye issue she is having. Pt wants to make sure that its ok to take with all of her other medications.  Routed to [place] for advice

## 2020-05-01 NOTE — Telephone Encounter (Signed)
Have Lauren double check but think ok

## 2020-05-01 NOTE — Progress Notes (Signed)
Per orders of Dr. Glori Bickers, q 3 month injection of B12 given in left arm, by Loreen Freud. Patient tolerated injection well. Also, by request of pt, HD flu shot given in right arm.

## 2020-05-01 NOTE — Telephone Encounter (Signed)
Yes, the acyclovir is safe to take with her other medications. She can go ahead and start.

## 2020-05-01 NOTE — Telephone Encounter (Signed)
Pt aware and agreeable with plan.  

## 2020-05-09 DIAGNOSIS — H10503 Unspecified blepharoconjunctivitis, bilateral: Secondary | ICD-10-CM | POA: Diagnosis not present

## 2020-05-15 DIAGNOSIS — R0902 Hypoxemia: Secondary | ICD-10-CM | POA: Diagnosis not present

## 2020-05-28 ENCOUNTER — Other Ambulatory Visit: Payer: Self-pay

## 2020-05-28 ENCOUNTER — Ambulatory Visit (HOSPITAL_COMMUNITY)
Admission: RE | Admit: 2020-05-28 | Discharge: 2020-05-28 | Disposition: A | Discharge status: Home / Self Care | Payer: Medicare Other | Source: Ambulatory Visit | Attending: Cardiology | Admitting: Cardiology

## 2020-05-28 ENCOUNTER — Encounter (HOSPITAL_COMMUNITY): Payer: Self-pay | Admitting: Cardiology

## 2020-05-28 VITALS — BP 120/78 | HR 68 | Wt 212.0 lb

## 2020-05-28 DIAGNOSIS — Z7982 Long term (current) use of aspirin: Secondary | ICD-10-CM | POA: Insufficient documentation

## 2020-05-28 DIAGNOSIS — Z86718 Personal history of other venous thrombosis and embolism: Secondary | ICD-10-CM | POA: Insufficient documentation

## 2020-05-28 DIAGNOSIS — Z8249 Family history of ischemic heart disease and other diseases of the circulatory system: Secondary | ICD-10-CM | POA: Diagnosis not present

## 2020-05-28 DIAGNOSIS — I255 Ischemic cardiomyopathy: Secondary | ICD-10-CM | POA: Insufficient documentation

## 2020-05-28 DIAGNOSIS — Z955 Presence of coronary angioplasty implant and graft: Secondary | ICD-10-CM | POA: Insufficient documentation

## 2020-05-28 DIAGNOSIS — Z87891 Personal history of nicotine dependence: Secondary | ICD-10-CM | POA: Insufficient documentation

## 2020-05-28 DIAGNOSIS — Z794 Long term (current) use of insulin: Secondary | ICD-10-CM | POA: Diagnosis not present

## 2020-05-28 DIAGNOSIS — Z7902 Long term (current) use of antithrombotics/antiplatelets: Secondary | ICD-10-CM | POA: Insufficient documentation

## 2020-05-28 DIAGNOSIS — I5022 Chronic systolic (congestive) heart failure: Secondary | ICD-10-CM | POA: Diagnosis not present

## 2020-05-28 DIAGNOSIS — I252 Old myocardial infarction: Secondary | ICD-10-CM | POA: Diagnosis not present

## 2020-05-28 DIAGNOSIS — Z9981 Dependence on supplemental oxygen: Secondary | ICD-10-CM | POA: Insufficient documentation

## 2020-05-28 DIAGNOSIS — I110 Hypertensive heart disease with heart failure: Secondary | ICD-10-CM | POA: Diagnosis not present

## 2020-05-28 DIAGNOSIS — Z79899 Other long term (current) drug therapy: Secondary | ICD-10-CM | POA: Diagnosis not present

## 2020-05-28 DIAGNOSIS — I2510 Atherosclerotic heart disease of native coronary artery without angina pectoris: Secondary | ICD-10-CM

## 2020-05-28 DIAGNOSIS — E785 Hyperlipidemia, unspecified: Secondary | ICD-10-CM | POA: Diagnosis not present

## 2020-05-28 DIAGNOSIS — E1169 Type 2 diabetes mellitus with other specified complication: Secondary | ICD-10-CM | POA: Diagnosis not present

## 2020-05-28 DIAGNOSIS — I5042 Chronic combined systolic (congestive) and diastolic (congestive) heart failure: Secondary | ICD-10-CM | POA: Diagnosis not present

## 2020-05-28 LAB — BASIC METABOLIC PANEL
Anion gap: 7 (ref 5–15)
BUN: 19 mg/dL (ref 8–23)
CO2: 32 mmol/L (ref 22–32)
Calcium: 9 mg/dL (ref 8.9–10.3)
Chloride: 103 mmol/L (ref 98–111)
Creatinine, Ser: 1.09 mg/dL — ABNORMAL HIGH (ref 0.44–1.00)
GFR, Estimated: 51 mL/min — ABNORMAL LOW (ref >60)
Glucose, Bld: 97 mg/dL (ref 70–99)
Potassium: 4.5 mmol/L (ref 3.5–5.1)
Sodium: 142 mmol/L (ref 135–145)

## 2020-05-28 LAB — LIPID PANEL
Cholesterol: 118 mg/dL (ref 0–200)
HDL: 43 mg/dL (ref >40)
LDL Cholesterol: 38 mg/dL (ref 0–99)
Total CHOL/HDL Ratio: 2.7 RATIO
Triglycerides: 187 mg/dL — ABNORMAL HIGH (ref <150)
VLDL: 37 mg/dL (ref 0–40)

## 2020-05-28 NOTE — Patient Instructions (Signed)
Labs done today, your results will be available in MyChart, we will contact you for abnormal readings.  Please call our office in May 2022 to schedule your follow up appointment  If you have any questions or concerns before your next appointment please send [place] a message through Welcome or call our office at [phone].    TO LEAVE A MESSAGE FOR THE NURSE SELECT OPTION 2, PLEASE LEAVE A MESSAGE INCLUDING: . YOUR NAME . DATE OF BIRTH . CALL BACK NUMBER . REASON FOR CALL**this is important as we prioritize the call backs  YOU WILL RECEIVE A CALL BACK THE SAME DAY AS LONG AS YOU CALL BEFORE 4:00 PM

## 2020-05-28 NOTE — Progress Notes (Signed)
HF Cardiology: Dr Aundra Dubin  Pulmonologist: Dr Melvyn Novas.  PCP: Abner Greenspan, MD  HPI: Kelli Velasquez is a 79 y.o. 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   She had a 32 day-long admission (10/31-12/1/18) for acute hypoxic respiratory failure secondary to acute decompensated HF which was a new diagnosis at the time. She required intubation x2 and hospital course was complicated by MSSA pneumonia and RUE DVT.  LHC 06/16/17 with 95% stenosis of mid RCA and s/p DES. She was discharged to SNF on Entresto 49/51, Lasix 40 QD, spironolactone 25 QD, Plavix and Eliquis 5 BID. Weight 219 pounds at time of discharge.   Admitted 07/07/17 from SNF with presyncope and hypotension. HF meds held. Received IV fluids and antibiotics.   Echo in 3/20 showed EF 55-60% with basal inferior hypokinesis and normal RV size and systolic function.  Echo in 3/21 showed that EF remains 55-60% with mild LVH, basal inferior hypokinesis.   She is now off prednisone.  She is symptomatically stable.  Using home oxygen still.  Able to walk in the house without dyspnea.  No orthopnea/PND.  Stable weight.  Not very active.  She has been off spironolactone due to elevated K.   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   Labs (1/19): K 5.1, creatinine 1.1 Labs (2/19): K 4.5, creatinine 1.13, LDL 79, HDL 55, hgb 12.3 Labs (8/19): K 4.9, creatinine 1.26 Labs (12/19): K 4.6, creatinine 1.26 Labs (12/20): hgb 12.1, K 4.5, creatinine 1.3, LDL 28, TGs 190 Labs (7/21): K 4.9, creatinine 1.41  PMH: 1. BOOP: Chronic prednisone, home oxygen.  2. Recurrent UTIs 3. HTN 4. LUE DVT 11/18 associated with PICC.  5. CAD:  - 2011 DES to LAD and RCA.  - LHC (11/18) with 95% mid RCA stenosis => DES.  6. Chronic systolic CHF: Ischemic cardiomyopathy.   - Echo  (10/18) with EF 35-40%.  - Echo (2/19) with EF 40-45%, mild LVH, normal RV.  - Echo (3/20) with EF 55-60%, basal inferior hypokinesis, normal RV.  - Echo (3/21): EF 55-60%, basal inferior hypokinesis.  7. GERD 8. Hyperlipidemia  ROS: All systems negative except as listed in HPI, PMH and Problem List.  Social History   Socioeconomic History  . Marital status: Married    Spouse name: Not on file  . Number of children: Not on file  . Years of education: Not on file  . Highest education level: Not on file  Occupational History  . Not on file  Tobacco Use  . Smoking status: Former Smoker    Packs/day: 1.00    Years: 25.00    Pack years: 25.00    Types: Cigarettes    Quit date: 07/27/1982    Years since quitting: 37.8  . Smokeless tobacco: Never Used  Vaping Use  . Vaping Use: Never used  Substance and Sexual Activity  . Alcohol use: Yes    Alcohol/week: 0.0 standard drinks    Comment: wine-rare  . Drug use: No  . Sexual activity: Not Currently  Other Topics Concern  . Not on file  Social History Narrative  . Not on file   Social Determinants of Health   Financial Resource Strain: Low Risk   . Difficulty of Paying Living Expenses: Not hard at all  Food Insecurity:   .  Worried About Charity fundraiser in the Last Year: Not on file  . Ran Out of Food in the Last Year: Not on file  Transportation Needs:   . Lack of Transportation (Medical): Not on file  . Lack of Transportation (Non-Medical): Not on file  Physical Activity:   . Days of Exercise per Week: Not on file  . Minutes of Exercise per Session: Not on file  Stress:   . Feeling of Stress : Not on file  Social Connections:   . Frequency of Communication with Friends and Family: Not on file  . Frequency of Social Gatherings with Friends and Family: Not on file  . Attends Religious Services: Not on file  . Active Member of Clubs or Organizations: Not on file  . Attends Archivist Meetings: Not on file   . Marital Status: Not on file  Intimate Partner Violence:   . Fear of Current or Ex-Partner: Not on file  . Emotionally Abused: Not on file  . Physically Abused: Not on file  . Sexually Abused: Not on file   Family History  Problem Relation Age of Onset  . Stroke Mother   . Diabetes Mother 72  . Stroke Father   . Heart failure Father   . Breast cancer Maternal Grandmother   . Colon cancer Neg Hx     Current Outpatient Medications  Medication Sig Dispense Refill  . acetaminophen (TYLENOL) 325 MG tablet Take 650 mg by mouth every 6 (six) hours as needed for mild pain. Take as needed per bottle     . albuterol (PROAIR HFA) 108 (90 Base) MCG/ACT inhaler INHALE 2 PUFFS BY MOUTH EVERY 4 HOURS AS NEEDED FOR WHEEZING OR FOR SHORTNESS OF BREATH 8 Inhaler 5  . aspirin 81 MG tablet Take 81 mg by mouth daily.    . bisoprolol (ZEBETA) 5 MG tablet TAKE 1.5 TABLETS (7.5 MG TOTAL) BY MOUTH DAILY. 135 tablet 2  . chlorpheniramine (CHLOR-TRIMETON) 4 MG tablet Take 4 mg by mouth 2 (two) times daily as needed for allergies.    Marland Kitchen clopidogrel (PLAVIX) 75 MG tablet TAKE 1 TABLET BY MOUTH EVERY DAY 90 tablet 1  . clotrimazole-betamethasone (LOTRISONE) cream Apply 1 application topically 2 (two) times daily as needed. To affected area/rash 30 g 1  . cyanocobalamin (,VITAMIN B-12,) 1000 MCG/ML injection Inject 1,000 mcg into the muscle every 3 (three) months.    . ENTRESTO 97-103 MG TAKE 1 TABLET BY MOUTH TWICE A DAY 60 tablet 6  . EPINEPHrine (EPIPEN 2-PAK) 0.3 mg/0.3 mL IJ SOAJ injection INJECT 0.3 MLS (0.3 MG TOTAL) INTO THE MUSCLE ONCE AS NEEDED FOR ALLERGIC REACTION 2 Device 1  . erythromycin ophthalmic ointment USE SMALL AMOUNT TO AFFECTED EYE AS NEEDED    . esomeprazole (NEXIUM) 40 MG capsule Take 1 capsule (40 mg total) by mouth daily. 90 capsule 3  . Evolocumab (REPATHA SURECLICK) 094 MG/ML SOAJ Inject 1 Dose into the skin every 14 (fourteen) days. 2 pen 11  . ezetimibe (ZETIA) 10 MG tablet TAKE 1  TABLET (10 MG TOTAL) BY MOUTH DAILY. 90 tablet 3  . famotidine (PEPCID) 20 MG tablet Take 40 mg by mouth daily.    . furosemide (LASIX) 20 MG tablet TAKE 1 TABLET BY MOUTH EVERY OTHER DAY 45 tablet 1  . insulin glargine (LANTUS) 100 UNIT/ML injection INJECT 22 UNITS INTO THE SKIN DAILY. (VIAL EXPIRES 28 DAYS AFTER FIRST USE) 30 mL 1  . methocarbamol (ROBAXIN) 500 MG tablet Take  1 tablet (500 mg total) by mouth every 8 (eight) hours as needed for muscle spasms. 90 tablet 0  . mometasone (ELOCON) 0.1 % cream Apply 1 application topically daily. 15 g 3  . OXYGEN 24/7 3l [employer]     No current facility-administered medications for this encounter.    Vitals:   05/28/20 1501  BP: 120/78  Pulse: 68  SpO2: 98%  Weight: 96.2 kg (212 lb)    PHYSICAL EXAM: General: NAD Neck: Thick. No JVD, no thyromegaly or thyroid nodule.  Lungs: Dry crackles at bases.  CV: Nondisplaced PMI.  Heart regular S1/S2, no S3/S4, no murmur.  No peripheral edema.  No carotid bruit.  Normal pedal pulses.  Abdomen: Soft, nontender, no hepatosplenomegaly, no distention.  Skin: Intact without lesions or rashes.  Neurologic: Alert and oriented x 3.  Psych: Normal affect. Extremities: No clubbing or cyanosis.  HEENT: Normal.   ASSESSMENT & PLAN: 1. Chronic systolic CHF: Ischemic cardiomyopathy.  Last echo in 3/21 showed improved EF at 55-60%.  NYHA class II symptoms, chronic.  She is not volume overloaded.  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   - Off spironolactone with elevated K.  - Continue Entresto 97/103 bid.  BMET today.  - Continue bisoprolol 7.5 mg daily.   - Continue Lasix 20 mg qod.  2. CAD: Most recently had DES to [facility] in 11/18.  No chest pain.   - Continue ASA 81 and Plavix 75.   - She cannot tolerate statins, now on Repatha and Zetia.  Check lipids today.      3. BOOP: She remains on home oxygen but is now off prednisone.  4. RUE DVT: In 11/18, related to PICC.  Now off  Eliquis.  Followup in 6 months   Kelli Velasquez 05/28/2020

## 2020-06-06 ENCOUNTER — Other Ambulatory Visit: Payer: Self-pay | Admitting: Internal Medicine

## 2020-06-12 ENCOUNTER — Other Ambulatory Visit: Payer: Self-pay | Admitting: Family Medicine

## 2020-06-14 ENCOUNTER — Telehealth: Payer: Self-pay | Admitting: Internal Medicine

## 2020-06-14 MED ORDER — REPATHA SURECLICK 140 MG/ML ~~LOC~~ SOAJ: 1.0000 | SUBCUTANEOUS | 2 refills | Status: DC

## 2020-06-14 NOTE — Telephone Encounter (Signed)
repatha refilled

## 2020-06-14 NOTE — Telephone Encounter (Signed)
Patient would like to know if she will need to continue taking Evolocumab (REPATHA SURECLICK) 996 MG/ML SOAJ in the future. Please advise.

## 2020-06-14 NOTE — Telephone Encounter (Signed)
Returned call to pt, she states that she tried to call and make her f/u appt December but, nothing was available until February, 09-03-19. She states that she has no more refills and now is concerned since this is her last injection. Please advise.

## 2020-06-15 DIAGNOSIS — R0902 Hypoxemia: Secondary | ICD-10-CM | POA: Diagnosis not present

## 2020-06-19 ENCOUNTER — Other Ambulatory Visit: Payer: Self-pay | Admitting: Family Medicine

## 2020-06-29 ENCOUNTER — Other Ambulatory Visit: Payer: Self-pay | Admitting: Family Medicine

## 2020-07-01 ENCOUNTER — Telehealth: Payer: Self-pay | Admitting: Internal Medicine

## 2020-07-01 NOTE — Telephone Encounter (Signed)
PA for repatha submitted via CMM (Key: [id number])

## 2020-07-03 NOTE — Telephone Encounter (Signed)
Medication approved until 07/26/2021

## 2020-07-06 ENCOUNTER — Other Ambulatory Visit: Payer: Self-pay | Admitting: Family Medicine

## 2020-07-08 NOTE — Telephone Encounter (Signed)
Pt has had a few acute (UTI) appts but last CPE was 07/17/19, and no future appts., please advise

## 2020-07-08 NOTE — Telephone Encounter (Signed)
Please schedule a spring annual exam and refill until then

## 2020-07-08 NOTE — Telephone Encounter (Signed)
Med refilled once and Carrie will reach out to pt to try and get appt scheduled  

## 2020-07-09 ENCOUNTER — Other Ambulatory Visit: Payer: Self-pay | Admitting: Family Medicine

## 2020-07-15 ENCOUNTER — Other Ambulatory Visit: Payer: Self-pay | Admitting: *Deleted

## 2020-07-15 DIAGNOSIS — R0902 Hypoxemia: Secondary | ICD-10-CM | POA: Diagnosis not present

## 2020-07-15 MED ORDER — INSULIN GLARGINE 100 UNIT/ML ~~LOC~~ SOLN: SUBCUTANEOUS | 1 refills | Status: DC

## 2020-08-12 ENCOUNTER — Telehealth: Payer: Self-pay

## 2020-08-12 NOTE — Chronic Care Management (AMB) (Addendum)
Chronic Care Management Pharmacy Assistant   Name: Kelli Velasquez  MRN: [id number] DOB: [birth year]  Reason for Encounter: Medication Review/Appointment Reminder  Patient Questions:  1.  Have you seen any other providers since your last visit? Yes 11/2/21Loralie Champagne, MD- Cardiology 03/18/20- Marchia Meiers, MD- Ophthalmology 02/21/20- Loura Pardon, MD- PCP   2.  Any changes in your medicines or health? Yes 03/01/20 Dr. Glori Bickers discontinued Nitrofurantoin and started short course of Bactrim DS. 02/21/20- Dr. Glori Bickers started short course of Nitrofurantoin   PCP : Tower, Wynelle Fanny, MD  Allergies:   Allergies  Allergen Reactions   Fish Allergy Anaphylaxis   Shellfish Allergy Anaphylaxis   Aspirin     REACTION: nausea and vomiting High doses   Atorvastatin     REACTION: leg pain   Crestor [Rosuvastatin Calcium] Other (See Comments)    Muscle pain - allergy/intolerance   Penicillins     Due to mold allergy per pt   Simvastatin     REACTION: muscle pain   Trandolapril     REACTION: leg pain    Medications: Outpatient Encounter Medications as of 08/12/2020  Medication Sig   furosemide (LASIX) 20 MG tablet TAKE 1 TABLET BY MOUTH EVERY OTHER DAY   acetaminophen (TYLENOL) 325 MG tablet Take 650 mg by mouth every 6 (six) hours as needed for mild pain. Take as needed per bottle    albuterol (PROAIR HFA) 108 (90 Base) MCG/ACT inhaler INHALE 2 PUFFS BY MOUTH EVERY 4 HOURS AS NEEDED FOR WHEEZING OR FOR SHORTNESS OF BREATH   aspirin 81 MG tablet Take 81 mg by mouth daily.   bisoprolol (ZEBETA) 5 MG tablet TAKE 1.5 TABLETS (7.5 MG TOTAL) BY MOUTH DAILY.   chlorpheniramine (CHLOR-TRIMETON) 4 MG tablet Take 4 mg by mouth 2 (two) times daily as needed for allergies.   clopidogrel (PLAVIX) 75 MG tablet TAKE 1 TABLET BY MOUTH EVERY DAY   clotrimazole-betamethasone (LOTRISONE) cream Apply 1 application topically 2 (two) times daily as needed. To affected area/rash   cyanocobalamin (,VITAMIN B-12,)  1000 MCG/ML injection Inject 1,000 mcg into the muscle every 3 (three) months.   ENTRESTO 97-103 MG TAKE 1 TABLET BY MOUTH TWICE A DAY   EPINEPHrine (EPIPEN 2-PAK) 0.3 mg/0.3 mL IJ SOAJ injection INJECT 0.3 MLS (0.3 MG TOTAL) INTO THE MUSCLE ONCE AS NEEDED FOR ALLERGIC REACTION   erythromycin ophthalmic ointment USE SMALL AMOUNT TO AFFECTED EYE AS NEEDED   esomeprazole (NEXIUM) 40 MG capsule TAKE 1 CAPSULE BY MOUTH EVERY DAY   Evolocumab (REPATHA SURECLICK) 025 MG/ML SOAJ Inject 1 Dose into the skin every 14 (fourteen) days.   ezetimibe (ZETIA) 10 MG tablet TAKE 1 TABLET BY MOUTH EVERY DAY   famotidine (PEPCID) 20 MG tablet Take 40 mg by mouth daily.   insulin glargine (LANTUS) 100 UNIT/ML injection INJECT 22 UNITS INTO THE SKIN DAILY.  (Dx. Code: E11.69, E66.9)   methocarbamol (ROBAXIN) 500 MG tablet Take 1 tablet (500 mg total) by mouth every 8 (eight) hours as needed for muscle spasms.   mometasone (ELOCON) 0.1 % cream Apply 1 application topically daily.   OXYGEN 24/7 3l [employer]   No facility-administered encounter medications on file as of 08/12/2020.    Current Diagnosis: Patient Active Problem List   Diagnosis Date Noted   Acute cystitis 11/17/2019   Renal insufficiency 07/17/2019   History of arm fracture 03/21/2019   Low back pain 08/23/2018   Eczema 07/08/2018   Hoarseness of voice 01/03/2018  Coronary artery disease 07/29/7251   Chronic systolic heart failure ([place]) 07/07/2017   Abnormal urinalysis 07/07/2017   History of DVT (deep vein thrombosis) 07/07/2017   Diabetes mellitus type 2 in obese ([place]) 07/07/2017   BOOP (bronchiolitis obliterans with organizing pneumonia) ([place])    Hypoxemia    Encounter for screening mammogram for breast cancer 02/02/2017   Chronic combined systolic and diastolic CHF, NYHA class 2 ([place]) 02/02/2017   Depressed mood 12/02/2016   Routine general medical examination at a health care facility 66/44/0347   Supraumbilical hernia 42/59/5638   Lump  in the abdomen 04/10/2015   GERD (gastroesophageal reflux disease) 10/10/2014   B12 deficiency 10/09/2014   Cataracts, bilateral 10/09/2014   Dry eyes 10/09/2014   Fatigue 05/08/2014   Pedal edema 05/08/2014   Osteopenia 11/28/2013   Estrogen deficiency 11/02/2013   Dyspnea on exertion 06/27/2013   Hernia, incisional 04/03/2013   Pulmonary nodules c/w Nodular BOOP 02/02/2013   Cough 12/18/2012   Chronic respiratory failure with hypoxia and hypercapnia ([place]) 10/26/2012   Special screening for malignant neoplasms, colon 06/16/2011   CAD, NATIVE VESSEL 08/19/2009   MYOCARDIAL INFARCTION, SUBENDOCARDIAL, 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  Are you having any problems with your medications? Denies any issues with medications.   What concerns would like to discuss with the pharmacist? "No concerns that she can think of."  Patient reminded to have all medications and supplements available for review with Debbora Dus, Pharm. D, at their telephone visit on 08/14/20 at 1:00 PM.  Follow-Up:  Pharmacist Review   Debbora Dus, CPP notified  Margaretmary Dys, [place] Pharmacy Assistant [phone]

## 2020-08-14 ENCOUNTER — Ambulatory Visit: Payer: Medicare Other

## 2020-08-14 ENCOUNTER — Ambulatory Visit (INDEPENDENT_AMBULATORY_CARE_PROVIDER_SITE_OTHER): Payer: Medicare Other

## 2020-08-14 ENCOUNTER — Other Ambulatory Visit: Payer: Self-pay

## 2020-08-14 DIAGNOSIS — E538 Deficiency of other specified B group vitamins: Secondary | ICD-10-CM

## 2020-08-14 DIAGNOSIS — I10 Essential (primary) hypertension: Secondary | ICD-10-CM

## 2020-08-14 DIAGNOSIS — E669 Obesity, unspecified: Secondary | ICD-10-CM

## 2020-08-14 MED ORDER — CYANOCOBALAMIN 1000 MCG/ML IJ SOLN
1000.0000 ug | Freq: Once | INTRAMUSCULAR | Status: AC
  Administered 2020-08-14: 1000 ug via INTRAMUSCULAR

## 2020-08-14 NOTE — Chronic Care Management (AMB) (Unsigned)
Chronic Care Management Pharmacy  Name: Kelli Velasquez  MRN: [id number] DOB: [birth year]  Chief Complaint/ HPI  Kelli Velasquez,  80 y.o., female presents for their Follow-Up CCM visit with the clinical pharmacist via telephone.  PCP : Abner Greenspan, MD  Their chronic conditions include: hypertension, CAD, CHF, chronic respiratory failure, GERD, HLD, DM type 2, osteoarthritis, osteopenia, eczema, OAB, renal insufficiency, urinary incontinence, obesity, B12 deficiency, history of DVT  Office Visits:  02/21/20: PCP - acute cystitis, rx macrobid  11/17/19: PCP - acute cystitis, rx for bactrim  07/17/19: PCP [facility] - DM controlled, 90-120s, avoids sweets, still on prednisone 2.5 mg daily, no hypoglycemia   Consult Visit:  05/28/20: CHF clinic - Chronic systolic CHF: Last echo in 3/21 showed improved EF at 55-60%. NYHA class II symptoms, chronic.  She is not volume overloaded.  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  Off spironolactone with elevated K. Continue Entresto 97/103 bid BMET today.Continue bisoprolol 7.5 mg daily. Continue Lasix 20 mg qod.  PAD: Continue ASA 81 and Plavix 75.  She cannot tolerate statins, now on Repatha and Zetia.  Check lipids today. BOOP: She remains on home oxygen but is now off prednisone  02/05/20: Pulmonology - cont current meds (Dr. Melvyn Novas)  10/16/19: Cardiology - cont current meds (Dr. Aundra Dubin)  Medications: Outpatient Encounter Medications as of 08/14/2020  Medication Sig  . furosemide (LASIX) 20 MG tablet TAKE 1 TABLET BY MOUTH EVERY OTHER DAY  . acetaminophen (TYLENOL) 325 MG tablet Take 650 mg by mouth every 6 (six) hours as needed for mild pain. Take as needed per bottle   . albuterol (PROAIR HFA) 108 (90 Base) MCG/ACT inhaler INHALE 2 PUFFS BY MOUTH EVERY 4 HOURS AS NEEDED FOR WHEEZING OR FOR SHORTNESS OF BREATH  . aspirin 81 MG tablet Take 81 mg by mouth daily.  . bisoprolol (ZEBETA) 5 MG tablet TAKE 1.5 TABLETS (7.5 MG TOTAL)  BY MOUTH DAILY.  . chlorpheniramine (CHLOR-TRIMETON) 4 MG tablet Take 4 mg by mouth 2 (two) times daily as needed for allergies.  Marland Kitchen clopidogrel (PLAVIX) 75 MG tablet TAKE 1 TABLET BY MOUTH EVERY DAY  . clotrimazole-betamethasone (LOTRISONE) cream Apply 1 application topically 2 (two) times daily as needed. To affected area/rash  . cyanocobalamin (,VITAMIN B-12,) 1000 MCG/ML injection Inject 1,000 mcg into the muscle every 3 (three) months.  . ENTRESTO 97-103 MG TAKE 1 TABLET BY MOUTH TWICE A DAY  . EPINEPHrine (EPIPEN 2-PAK) 0.3 mg/0.3 mL IJ SOAJ injection INJECT 0.3 MLS (0.3 MG TOTAL) INTO THE MUSCLE ONCE AS NEEDED FOR ALLERGIC REACTION  . erythromycin ophthalmic ointment USE SMALL AMOUNT TO AFFECTED EYE AS NEEDED  . esomeprazole (NEXIUM) 40 MG capsule TAKE 1 CAPSULE BY MOUTH EVERY DAY  . Evolocumab (REPATHA SURECLICK) [id number] MG/ML SOAJ Inject 1 Dose into the skin every 14 (fourteen) days.  Marland Kitchen ezetimibe (ZETIA) 10 MG tablet TAKE 1 TABLET BY MOUTH EVERY DAY  . famotidine (PEPCID) 20 MG tablet Take 40 mg by mouth daily.  . insulin glargine (LANTUS) 100 UNIT/ML injection INJECT 22 UNITS INTO THE SKIN DAILY.  (Dx. Code: E11.69, E66.9)  . methocarbamol (ROBAXIN) 500 MG tablet Take 1 tablet (500 mg total) by mouth every 8 (eight) hours as needed for muscle spasms.  . mometasone (ELOCON) 0.1 % cream Apply 1 application topically daily.  . OXYGEN 24/7 3l [employer]  . [EXPIRED] cyanocobalamin ((VITAMIN B-12)) injection 1,000 mcg    No facility-administered encounter medications on file as  of 08/14/2020.   Current Diagnosis/Assessment:   Goals    . lose weight     Starting 07/06/2018, I will continue to practice portion control in an effort to lose weight.     . Pharmacy Care Plan     CARE PLAN ENTRY  Current Barriers:  . Chronic Disease Management support, education, and care coordination needs related to Hypertension and Diabetes   Hypertension BP Readings from Last 3 Encounters:  05/28/20 120/78   02/21/20 128/74  02/05/20 124/74 .  Pharmacist Clinical Goal(s): o Over the next 6 months, patient will work with PharmD and providers to maintain BP goal <140/90 mmHg . Current regimen:  o Bisoprolol 5 mg - 1 and 1/2 tablets daily o Entresto 97-103 mg - 1 tablet twice daily o Furosemide 20 mg - 1 tablet every other day . Interventions: o Comprehensive medication review . Patient self care activities - Over the next 6 months, patient will: o Continue to check BP daily, document, and provide at future appointments o Check weight daily o Ensure daily salt intake < 2300 mg/day  Diabetes Lab Results  Component Value Date/Time   HGBA1C 5.5 07/10/2019 09:29 AM   HGBA1C 5.4 07/06/2018 12:16 PM   . Pharmacist Clinical Goal(s): o Over the next 3 months, patient will work with PharmD and providers to maintain A1c goal <7% . Current regimen:  o Lantus - Inject 22 units daily at bedtime . Interventions: o Recommend dose reduction to 18 units and daily BG monitoring for next 7 days (approved by Dr. Glori Bickers) . Patient self care activities - Over the next 3 months, patient will: o Check blood sugar once daily, document, and provide at follow up in 1 week with assistant Ria Comment o Reduce Lantus to 18 units  o Contact provider with any episodes of hypoglycemia or hyperglycemia   Please see past updates related to this goal by clicking on the "Past Updates" button in the selected goal        Hypertension   CMP Latest Ref Rng & Units 05/28/2020 01/26/2020 01/16/2020  Glucose 70 - 99 mg/dL 97 96 105(H)  BUN 8 - 23 mg/dL 19 33(H) 32(H)  Creatinine 0.44 - 1.00 mg/dL 1.09(H) 1.41(H) 1.35(H)  Sodium 135 - 145 mmol/L 142 142 140  Potassium 3.5 - 5.1 mmol/L 4.5 4.9 5.4(H)  Chloride 98 - 111 mmol/L 103 108 105  CO2 22 - 32 mmol/L 32 28 27  Calcium 8.9 - 10.3 mg/dL 9.0 9.1 9.2  Total Protein 6.0 - 8.3 g/dL - - -  Total Bilirubin 0.2 - 1.2 mg/dL - - -  Alkaline Phos 39 - 117 U/L - - -  AST 0 - 37  U/L - - -  ALT 0 - 35 U/L - - -   Office blood pressures are: BP Readings from Last 3 Encounters:  05/28/20 120/78  02/21/20 128/74  02/05/20 124/74   Patient has failed these meds in the past: none reported Patient checks BP at home daily Patient home BP readings are ranging:none reported today, usually 120/70s  Patient is currently controlled on the following medications:   Bisoprolol 5 mg - 1 and 1/2 tablets daily  Update 08/14/20: BP in clinic stable within goal. BMP reviewed, normal.  Plan: Continue current medications  Hyperlipidemia/CAD   LDL goal < 70 Lipid Panel     Component Value Date/Time   CHOL 118 05/28/2020 1544   CHOL 109 06/27/2019 0936   TRIG 187 (H) 05/28/2020 1544  HDL 43 05/28/2020 1544   HDL 45 06/27/2019 0936   LDLCALC 38 05/28/2020 1544   LDLCALC 32 06/27/2019 0936   LDLDIRECT 121.0 07/06/2018 1216    CBC Latest Ref Rng & Units 07/10/2019 07/06/2018 08/27/2017  WBC 4.0 - 10.5 K/uL 11.2(H) 13.0(H) 19.7(H)  Hemoglobin 12.0 - 15.0 g/dL 12.1 11.6(L) 12.3  Hematocrit 36.0 - 46.0 % 37.7 35.7(L) 40.1  Platelets 150.0 - 400.0 K/uL 264.0 261.0 224    The ASCVD Risk score Mikey Bussing DC Jr., et al., 2013) failed to calculate for the following reasons:   The 2013 ASCVD risk score is only valid for ages 44 to 69   The patient has a prior MI or stroke diagnosis   Patient has failed these meds in past: statins Patient is currently controlled on the following medications:  . Aspirin 81 mg - 1 tablet daily . Clopidogrel 75 mg - 1 tablet daily . Repatha 140 mg - Inject every 14 days  . Ezetimibe 10 mg - 1 tablet daily   At previous CCM visit - reviewed history [facility] to [facility] in [facility]/18 - DAPT per cardio. Husband helps inject Repatha if needed. Adherence confirmed, refills timely.  No update/changes 08/14/20  Plan: Continue current medications  Diabetes   Recent Relevant Labs: Lab Results  Component Value Date/Time   HGBA1C 5.5 07/10/2019 09:29 AM   HGBA1C 5.4  07/06/2018 12:16 PM    A1c goal < 7% Patient has failed these meds in past: none - was not on any other medications  Patient is currently controlled on the following medications:   Lantus - Inject 22 units daily at bedtime  Update 08/14/20: Patient has been off prednisone since October 24, 2019. 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 Still on same dose of Lantus. Denies any hypoglycemia or s/s of hypoglycemia. Interested in tapering off therapy and consider metformin if needed. Checking BG before breakfast, then skip a day, then checks random time the next day  Recent vital signs log: 07/28/20 Bedtime -137/69, 95%, 71 BPM, BG 118, weight 208 lbs 07/30/20 Fasting - 137/65, 94%, 77 BPM, BG 120, weight 207 lbs 08/01/20 7:55 PM - 111/65, 95%, 72 BPM, BG 165, weight 207 lbs  08/03/20 Fasting - 133/60 95%, 67 BPM, BG 117, weight 207 lbs  08/05/20 6:40 PM - 138/64, 96%, 73 BPM, BG 128, weight 209 lbs 08/07/20 Fasting - 137/62, 95%, 73 BPM, BG 113, weight 207 lbs 08/09/20 12:25 PM -  137/62, 97%, 68 BPM, BG 92, weight 209 lbs  08/11/20: Fasting - 136/64, 98%, 69 BPM, BG 99, weight 207 lbs   Plan: Continue current medications; Recommend begin taper off insulin. Consider metformin if needed. Consult PCP.   Heart Failure   Type: Combined Systolic and Diastolic  Last ejection fraction: 10/16/2019 showed EF 55-60% NYHA Class: II (slight limitation of activity) AHA HF Stage: C (Heart disease and symptoms present)  Patient has failed these meds in past: spironolactone (hyperkalemia, dizziness) Patient is currently controlled on the following medications:   Entresto 97-103 mg - 1 tablet BID  Bisoprolol 5 mg - 1 and 1/2 tablets daily  Furosemide 20 mg - 1 tablet every other day   We discussed: checks weight daily, denies weight fluctuations, denies swelling, reports SOB is stable, has SOB with normal household chores   Reports losing weight since off prednisone   Update 08/14/20: Patient very bothered by frequent  urination with furosemide. Reports days without it are much better. Pt limits fluids to < 2 liter per  day. Drinks water, diet ginger-ale, diet cranberry juice. Denies any swelling and weight is very stable (see log above). Pt reports she discussed with cardio and was told lasix was managed by PCP. We discussed potential to change Lasix to PRN assuming she continues monitoring weight daily. She would like to hold of on changes since we are considering lowering her insulin today.   Plan: Continue current medications; Consider change Lasix to PRN use at next visit.  Chronic Respiratory Failure/BOOP   Patient has failed these meds in past: reports tried every allergy pill available  Patient is currently controlled on the following medications:   Albuterol HFA 90 mcg - 2 puffs q4h PRN  Chlorpheniramine 4 mg - 2 tablets BID, confirms taking every day, denies drowsiness   Supplemental oxygen - wears 24/7  Rescue inhaler: uses very seldom, once in past 3-6 months  We discussed: checks oxygen daily   No update/changes 08/14/20  Plan: Continue current medications  GERD   Patient has failed these meds in past: none  Patient is currently controlled on the following medications:   Esomeprazole 40 mg - 1 tablet daily 1 hour before breakfast  Famotidine 20 mg (Pepcid) - 2 tablets daily every evening   Symptoms: denies heartburn or breakthrough symptoms  Duration: reports PPI > 5 years   No update/changes 08/14/20 - did not discuss today  Plan: Continue current medications; Recommend considering taper off esomeprazole.   B12 Deficiency    B12: 07/10/19 522 Patient is currently controlled on the following medications:   Vitamin B12 1000 mcg - inject every 3 months   We discussed: no concerns   No update/changes 08/14/20  Plan: Continue current medications   Osteopenia   DEXA 03/31/2017: Femur Total Left T-score of -2.1 FRAX: The probability of a major osteoporotic fracture is 31.7 %  within the next ten years. The probability of a hip fracture is 18.3 % within the next ten years.   Patient has failed these meds in past: Prolia - fish allergy, Alendronate - GI intolerance Patient is currently uncontrolled on the following medications:   No pharmacotherapy  We discussed: wheelchair bound, ensure an adequate intake of dietary calcium (1200 mg/d) and vitamin D (800 IU daily), risks with PPI; pt is not interested in additional therapy  No update/changes 08/14/20  Plan: Ensure an adequate intake of dietary calcium (1200 mg/d) and vitamin D (800 IU daily).  Osteoarthritis   Patient is currently controlled on the following medications:   Methocarbamol 500 mg - 1 tablet q8h PRN muscle spasm (takes PRN)  Tylenol Arthritis - 1 tablet q8h PRN arthritis (takes daily, up to TID)   We discussed: reports arthritis has worsened since stopping prednisone, primarily in shoulder, hip joints, also has back pain due to degenerative disc, able to walk around the house but going to grocery store has to use electric cart   No update/changes 08/14/20  Plan: Continue current medications   Medication Management:  Misc Meds: Mometasone cream - applys to ears for irritation from oxygen straps   OTCs: denies any vitamins/supplements; uses essential oils by diffuser daily  Pharmacy: [facility]  Adherence: < 5 day gap refills, auto-refill  Affordability: denies cost concerns  CCM Follow Up: 1 month telephone visit; 1 week CMA BG log   Debbora Dus, PharmD Clinical Pharmacist [facility] at [facility] [phone]

## 2020-08-14 NOTE — Progress Notes (Signed)
Per orders of Dr. Danise Mina, in Dr. Marliss Coots absence, every 3 month injection of B12 given by Loreen Freud. Patient tolerated injection well.

## 2020-08-15 DIAGNOSIS — R0902 Hypoxemia: Secondary | ICD-10-CM | POA: Diagnosis not present

## 2020-08-21 ENCOUNTER — Telehealth: Payer: Self-pay

## 2020-08-21 NOTE — Telephone Encounter (Signed)
PCP consult regarding CCM 08/14/20:  Patient is interested in beginning taper off insulin. She has been off steroids since March 2021. She continues monotherapy with Lantus 22 units daily at bedtime. She checks BG every day rotating between fasting and a random check every other day. No hypoglycemia.   Reports the following BG readings from past 2 weeks:  07/28/20 Bedtime BG 118  07/30/20 Fasting BG 120  08/01/20 7:55 PM BG 165  08/03/20 Fasting BG 117  08/05/20 6:40 PM BG 128  08/07/20 Fasting BG 113  08/09/20 12:25 PM BG 92  08/11/20: Fasting BG 99  Recommend decrease insulin to 18 units and continue to monitor daily with follow up CCM call for further taper in 7 days. She is open to starting metformin if needed later on.  Debbora Dus, PharmD Clinical Pharmacist [facility] at [facility] [phone]

## 2020-08-21 NOTE — Telephone Encounter (Signed)
That sounds great- is she aware of the change or do we need to call her?

## 2020-08-22 NOTE — Progress Notes (Signed)
I have personally reviewed this encounter including the documentation in this note and have collaborated with the care management provider regarding care management and care coordination activities to include development and update of the comprehensive care plan. I am certifying that I agree with the content of this note and encounter as supervising physician. ? ?Marne Tower MD  ?

## 2020-08-22 NOTE — Patient Instructions (Signed)
Dear Kelli Velasquez,  Below is a summary of the goals we discussed during our follow up appointment on August 22, 2020. Please contact me anytime with questions or concerns.   Visit Information  Goals Addressed            This Visit's Progress   . Pharmacy Care Plan       CARE PLAN ENTRY  Current Barriers:  . Chronic Disease Management support, education, and care coordination needs related to Hypertension and Diabetes   Hypertension BP Readings from Last 3 Encounters:  05/28/20 120/78  02/21/20 128/74  02/05/20 124/74 .  Pharmacist Clinical Goal(s): o Over the next 6 months, patient will work with PharmD and providers to maintain BP goal <140/90 mmHg . Current regimen:  o Bisoprolol 5 mg - 1 and 1/2 tablets daily o Entresto 97-103 mg - 1 tablet twice daily o Furosemide 20 mg - 1 tablet every other day . Interventions: o Comprehensive medication review . Patient self care activities - Over the next 6 months, patient will: o Continue to check BP daily, document, and provide at future appointments o Check weight daily o Ensure daily salt intake < 2300 mg/day  Diabetes Lab Results  Component Value Date/Time   HGBA1C 5.5 07/10/2019 09:29 AM   HGBA1C 5.4 07/06/2018 12:16 PM   . Pharmacist Clinical Goal(s): o Over the next 3 months, patient will work with PharmD and providers to maintain A1c goal <7% . Current regimen:  o Lantus - Inject 22 units daily at bedtime . Interventions: o Recommend dose reduction to 18 units and daily BG monitoring for next 7 days (approved by Dr. Glori Bickers) . Patient self care activities - Over the next 3 months, patient will: o Check blood sugar once daily, document, and provide at follow up in 1 week with assistant Ria Comment o Reduce Lantus to 18 units  o Contact provider with any episodes of hypoglycemia or hyperglycemia   Please see past updates related to this goal by clicking on the "Past Updates" button in the selected goal        The  patient verbalized understanding of instructions, educational materials, and care plan provided today and declined offer to receive copy of patient instructions, educational materials, and care plan.   The pharmacy team will reach out to the patient again over the next 10 days.   Debbora Dus, PharmD Clinical Pharmacist [facility] at [facility] [phone]    Basics of Medicine Management Taking your medicines correctly is an important part of managing or preventing medical problems. Make sure you know what disease or condition your medicine is treating, and how and when to take it. If you do not take your medicine correctly, it may not work well and may cause unpleasant side effects, including serious health problems. What should I do when I am taking medicines?  Read all the labels and inserts that come with your medicines. Review the information often.  Talk with your pharmacist if you get a refill and notice a change in the size, color, or shape of your medicines.  Know the potential side effects for each medicine that you take.  Try to get all your medicines from the same pharmacy. The pharmacist will have all your information and will understand how your medicines will affect each other (interact).  Tell your health care provider about all your medicines, including over-the-counter medicines, vitamins, and herbal or dietary supplements. He or she will make sure that nothing will interact  with any of your prescribed medicines.   How can I take my medicines safely?  Take medicines only as told by your health care provider. ? Do not take more of your medicine than instructed. ? Do not take anyone else's medicines. ? Do not share your medicines with others. ? Do not stop taking your medicines unless your health care provider tells you to do so. ? You may need to avoid alcohol or certain foods or liquids when taking certain medicines. Follow your health care provider's  instructions.  Do not split, mash, or chew your medicines unless your health care provider tells you to do so. Tell your health care provider if you have trouble swallowing your medicines.  For liquid medicine, use the dosing container that was provided. How should I organize my medicines? Know your medicines  Know what each of your medicines looks like. This includes size, color, and shape. Tell your health care provider if you are having trouble recognizing all the medicines that you are taking.  If you cannot tell your medicines apart because they look similar, keep them in original bottles.  If you cannot read the labels on the bottles, tell your pharmacist to put your medicines in containers with large print.  Review your medicines and your schedule with family members, a friend, or a caregiver. Use a pill organizer  Use a tool to organize your medicine schedule. Tools include a weekly pillbox, a written chart, a notebook, or a calendar.  Your tool should help you remember the following things about each medicine: ? The name of the medicine. ? The amount (dose) to take. ? The schedule. This is the day and time the medicine should be taken. ? The appearance. This includes color, shape, size, and stamp. ? How to take your medicines. This includes instructions to take them with food, without food, with fluids, or with other medicines.  Create reminders for taking your medicines. Use sticky notes, or alarms on your watch, mobile device, or phone calendar.  You may choose to use a more advanced management system. These systems have storage, alarms, and visual and audio prompts.  Some medicines can be taken on an "as-needed" basis. These include medicines for nausea or pain. If you take an as-needed medicine, write down the name and dose, as well as the date and time that you took it.   How should I plan for travel?  Take your pillbox, medicines, and organization system with you when  traveling.  Have your medicines refilled before you travel. This will ensure that you do not run out of your medicines while you are away from home.  Always carry an updated list of your medicines with you. If there is an emergency, a first responder can quickly see what medicines you are taking.  Do not pack your medicines in checked luggage in case your luggage is lost or delayed.  If any of your medicines is considered a controlled substance, make sure you bring a letter from your health care provider with you. How should I store and discard my medicines? For safe storage:  Store medicines in a cool, dry area away from light, or as directed by your health care provider. Do not store medicines in the bathroom. Heat and humidity will affect them.  Do not store your medicines with other chemicals, or with medicines for pets or other household members.  Keep medicines away from children and pets. Do not leave them on counters or bedside tables.  Store them in high cabinets or on high shelves. For safe disposal:  Check expiration dates regularly. Do not take expired medicines. Discard medicines that are older than the expiration date.  Learn a safe way to dispose of your medicines. You may: ? Use a local government, hospital, or pharmacy medicine-take-back program. ? Mix the medicines with inedible substances, put them in a sealed bag or empty container, and throw them in the trash. What should I remember?  Tell your health care provider if you: ? Experience side effects. ? Have new symptoms. ? Have other concerns about taking your medicines.  Review your medicines regularly with your health care provider. Other medicines, diet, medical conditions, weight changes, and daily habits can all affect how medicines work. Ask if you need to continue taking each medicine, and discuss how well each one is working.  Refill your medicines early to avoid running out of them.  In case of an  accidental overdose, call your local [place] at [phone] or visit your local emergency department immediately. This is important. Summary  Taking your medicines correctly is an important part of managing or preventing medical problems.  You need to make sure that you understand what you are taking a medicine for, as well as how and when you need to take it.  Know your medicines and use a pill organizer to help you take your medicines correctly.  In case of an accidental overdose, call your local [place] at [phone] or visit your local emergency department immediately. This is important. This information is not intended to replace advice given to you by your health care provider. Make sure you discuss any questions you have with your health care provider. Document Revised: 07/08/2017 Document Reviewed: 07/08/2017 Elsevier Patient Education  2021 [employer].

## 2020-08-22 NOTE — Chronic Care Management (AMB) (Addendum)
Chronic Care Management Pharmacy Assistant   Name: Kelli Velasquez  MRN: [id number] DOB: Oct 21, 1940  Reason for Encounter: Medication Review - Medication adjusment   PCP : Abner Greenspan, MD  Allergies:   Allergies  Allergen Reactions   Fish Allergy Anaphylaxis   Shellfish Allergy Anaphylaxis   Aspirin     REACTION: nausea and vomiting High doses   Atorvastatin     REACTION: leg pain   Crestor [Rosuvastatin Calcium] Other (See Comments)    Muscle pain - allergy/intolerance   Penicillins     Due to mold allergy per pt   Simvastatin     REACTION: muscle pain   Trandolapril     REACTION: leg pain    Medications: Outpatient Encounter Medications as of 08/21/2020  Medication Sig   furosemide (LASIX) 20 MG tablet TAKE 1 TABLET BY MOUTH EVERY OTHER DAY   acetaminophen (TYLENOL) 325 MG tablet Take 650 mg by mouth every 6 (six) hours as needed for mild pain. Take as needed per bottle    albuterol (PROAIR HFA) 108 (90 Base) MCG/ACT inhaler INHALE 2 PUFFS BY MOUTH EVERY 4 HOURS AS NEEDED FOR WHEEZING OR FOR SHORTNESS OF BREATH   aspirin 81 MG tablet Take 81 mg by mouth daily.   bisoprolol (ZEBETA) 5 MG tablet TAKE 1.5 TABLETS (7.5 MG TOTAL) BY MOUTH DAILY.   chlorpheniramine (CHLOR-TRIMETON) 4 MG tablet Take 4 mg by mouth 2 (two) times daily as needed for allergies.   clopidogrel (PLAVIX) 75 MG tablet TAKE 1 TABLET BY MOUTH EVERY DAY   clotrimazole-betamethasone (LOTRISONE) cream Apply 1 application topically 2 (two) times daily as needed. To affected area/rash   cyanocobalamin (,VITAMIN B-12,) 1000 MCG/ML injection Inject 1,000 mcg into the muscle every 3 (three) months.   ENTRESTO 97-103 MG TAKE 1 TABLET BY MOUTH TWICE A DAY   EPINEPHrine (EPIPEN 2-PAK) 0.3 mg/0.3 mL IJ SOAJ injection INJECT 0.3 MLS (0.3 MG TOTAL) INTO THE MUSCLE ONCE AS NEEDED FOR ALLERGIC REACTION   erythromycin ophthalmic ointment USE SMALL AMOUNT TO AFFECTED EYE AS NEEDED   esomeprazole (NEXIUM) 40 MG capsule  TAKE 1 CAPSULE BY MOUTH EVERY DAY   Evolocumab (REPATHA SURECLICK) 474 MG/ML SOAJ Inject 1 Dose into the skin every 14 (fourteen) days.   ezetimibe (ZETIA) 10 MG tablet TAKE 1 TABLET BY MOUTH EVERY DAY   famotidine (PEPCID) 20 MG tablet Take 40 mg by mouth daily.   insulin glargine (LANTUS) 100 UNIT/ML injection INJECT 22 UNITS INTO THE SKIN DAILY.  (Dx. Code: E11.69, E66.9)   methocarbamol (ROBAXIN) 500 MG tablet Take 1 tablet (500 mg total) by mouth every 8 (eight) hours as needed for muscle spasms.   mometasone (ELOCON) 0.1 % cream Apply 1 application topically daily.   OXYGEN 24/7 3l [employer]   No facility-administered encounter medications on file as of 08/21/2020.    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[place])    Hypoxemia    Encounter for screening mammogram for breast cancer 02/02/2017  Chronic combined systolic and diastolic CHF, NYHA class 2 ([place]) 02/02/2017   Depressed mood 12/02/2016   Routine general medical examination at a health care facility 38/46/6599   Supraumbilical hernia 35/70/1779   Lump in the abdomen 04/10/2015   GERD (gastroesophageal reflux disease) 10/10/2014   B12 deficiency 10/09/2014   Cataracts, bilateral 10/09/2014   Dry eyes 10/09/2014   Fatigue 05/08/2014   Pedal edema 05/08/2014   Osteopenia 11/28/2013   Estrogen deficiency 11/02/2013   Dyspnea on exertion 06/27/2013   Hernia, incisional 04/03/2013   Pulmonary nodules c/w Nodular BOOP 02/02/2013   Cough  12/18/2012   Chronic respiratory failure with hypoxia and hypercapnia ([place]) 10/26/2012   Special screening for malignant neoplasms, colon 06/16/2011   CAD, NATIVE VESSEL 08/19/2009   MYOCARDIAL INFARCTION, SUBENDOCARDIAL, INITIAL EPISODE 08/07/2009   Morbid obesity due to excess calories ([place]) comp by hbp/ hyperlipidemia/ dm/ihd 09/01/2007   MYOPIA 03/03/2007   Hyperlipidemia associated with type 2 diabetes mellitus ([place]) 10/15/2006   Essential hypertension 10/15/2006   Allergic rhinitis 10/15/2006   Cough variant asthma vs UACS  10/15/2006   OVERACTIVE BLADDER 10/15/2006   OSTEOARTHRITIS 10/15/2006   Urinary incontinence 10/15/2006   Contacted [name] to let her know Dr. Glori Bickers agrees and we would like her to reduce her insulin to 18 units. Advised patient to call Debbora Dus, Pharm. D  if fasting BG consistently above 150 or random BG above 180. Patient agreed to phone call 08/29/20 at 9:00 AM to review blood glucose levels.   Follow-Up:  Pharmacist Review   Debbora Dus, CPP notified Kelli Velasquez, Kelli Velasquez Assistant [phone]  I have reviewed the care management and care coordination activities outlined in this encounter and I am certifying that I agree with the content of this note. No further action required.  Debbora Dus, PharmD Clinical Pharmacist [facility] [facility] at [facility] [phone]

## 2020-08-22 NOTE — Telephone Encounter (Signed)
My assistant will follow up with her, thank you!   Ria Comment, can you let Darilyn know Dr. Glori Bickers agrees and we would like her to reduce her insulin to 18 units. She should call me if fasting BG consistently above 150 or random BG above 180. Follow up with her in 1 week for BG log review.  Debbora Dus, PharmD Clinical Pharmacist [place] [facility] at [facility] [phone]

## 2020-08-29 ENCOUNTER — Telehealth: Payer: Self-pay

## 2020-08-29 NOTE — Telephone Encounter (Signed)
Dr. Glori Bickers,  Kelli Velasquez's blood glucose has ranged 106-135, fasting, on the reduced dose of insulin - Lantus 18 units daily. Since goal is to taper off insulin (and consider metformin),  recommended she decrease Lantus to 15 units this week and continue monitoring daily. She will call with any fasting BG > 150 or post prandial > 200. We will follow up with her in 7 days to consider further taper/metformin.   Debbora Dus, PharmD Clinical Pharmacist [place] [facility] at [facility] [phone]

## 2020-08-29 NOTE — Chronic Care Management (AMB) (Addendum)
Chronic Care Management Pharmacy Assistant   Name: Kelli Velasquez  MRN: [id number] DOB: 1940/12/15  Reason for Encounter: Disease State-  Review blood glucose log following recent insulin dose adjustment   PCP : Abner Greenspan, MD  Allergies:   Allergies  Allergen Reactions   Fish Allergy Anaphylaxis   Shellfish Allergy Anaphylaxis   Aspirin     REACTION: nausea and vomiting High doses   Atorvastatin     REACTION: leg pain   Crestor [Rosuvastatin Calcium] Other (See Comments)    Muscle pain - allergy/intolerance   Penicillins     Due to mold allergy per pt   Simvastatin     REACTION: muscle pain   Trandolapril     REACTION: leg pain    Medications: Outpatient Encounter Medications as of 08/29/2020  Medication Sig   furosemide (LASIX) 20 MG tablet TAKE 1 TABLET BY MOUTH EVERY OTHER DAY   acetaminophen (TYLENOL) 325 MG tablet Take 650 mg by mouth every 6 (six) hours as needed for mild pain. Take as needed per bottle    albuterol (PROAIR HFA) 108 (90 Base) MCG/ACT inhaler INHALE 2 PUFFS BY MOUTH EVERY 4 HOURS AS NEEDED FOR WHEEZING OR FOR SHORTNESS OF BREATH   aspirin 81 MG tablet Take 81 mg by mouth daily.   bisoprolol (ZEBETA) 5 MG tablet TAKE 1.5 TABLETS (7.5 MG TOTAL) BY MOUTH DAILY.   chlorpheniramine (CHLOR-TRIMETON) 4 MG tablet Take 4 mg by mouth 2 (two) times daily as needed for allergies.   clopidogrel (PLAVIX) 75 MG tablet TAKE 1 TABLET BY MOUTH EVERY DAY   clotrimazole-betamethasone (LOTRISONE) cream Apply 1 application topically 2 (two) times daily as needed. To affected area/rash   cyanocobalamin (,VITAMIN B-12,) 1000 MCG/ML injection Inject 1,000 mcg into the muscle every 3 (three) months.   ENTRESTO 97-103 MG TAKE 1 TABLET BY MOUTH TWICE A DAY   EPINEPHrine (EPIPEN 2-PAK) 0.3 mg/0.3 mL IJ SOAJ injection INJECT 0.3 MLS (0.3 MG TOTAL) INTO THE MUSCLE ONCE AS NEEDED FOR ALLERGIC REACTION   erythromycin ophthalmic ointment USE SMALL AMOUNT TO AFFECTED EYE AS NEEDED    esomeprazole (NEXIUM) 40 MG capsule TAKE 1 CAPSULE BY MOUTH EVERY DAY   Evolocumab (REPATHA SURECLICK) 315 MG/ML SOAJ Inject 1 Dose into the skin every 14 (fourteen) days.   ezetimibe (ZETIA) 10 MG tablet TAKE 1 TABLET BY MOUTH EVERY DAY   famotidine (PEPCID) 20 MG tablet Take 40 mg by mouth daily.   insulin glargine (LANTUS) 100 UNIT/ML injection INJECT 22 UNITS INTO THE SKIN DAILY.  (Dx. Code: E11.69, E66.9)   methocarbamol (ROBAXIN) 500 MG tablet Take 1 tablet (500 mg total) by mouth every 8 (eight) hours as needed for muscle spasms.   mometasone (ELOCON) 0.1 % cream Apply 1 application topically daily.   OXYGEN 24/7 3l [employer]   No facility-administered encounter medications on file as of 08/29/2020.    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[place])    Hypoxemia    Encounter for screening  mammogram for breast cancer 02/02/2017   Chronic combined systolic and diastolic CHF, NYHA class 2 ([place]) 02/02/2017   Depressed mood 12/02/2016   Routine general medical examination at a health care facility 37/16/9678   Supraumbilical hernia 93/81/0175   Lump in the abdomen 04/10/2015   GERD (gastroesophageal reflux disease) 10/10/2014   B12 deficiency 10/09/2014   Cataracts, bilateral 10/09/2014   Dry eyes 10/09/2014   Fatigue 05/08/2014   Pedal edema 05/08/2014   Osteopenia 11/28/2013   Estrogen deficiency 11/02/2013   Dyspnea on exertion 06/27/2013   Hernia, incisional 04/03/2013   Pulmonary nodules  c/w Nodular BOOP 02/02/2013   Cough 12/18/2012   Chronic respiratory failure with hypoxia and hypercapnia ([place]) 10/26/2012   Special screening for malignant neoplasms, colon 06/16/2011   CAD, NATIVE VESSEL 08/19/2009   MYOCARDIAL INFARCTION, SUBENDOCARDIAL, INITIAL EPISODE 08/07/2009   Morbid obesity due to excess calories ([place]) comp by hbp/ hyperlipidemia/ dm/ihd 09/01/2007   MYOPIA 03/03/2007   Hyperlipidemia associated with type 2 diabetes mellitus ([place]) 10/15/2006   Essential hypertension 10/15/2006   Allergic rhinitis 10/15/2006   Cough variant asthma vs UACS  10/15/2006   OVERACTIVE BLADDER 10/15/2006   OSTEOARTHRITIS 10/15/2006   Urinary incontinence 10/15/2006   Contacted Kelli Velasquez to follow up on blood glucose readings following the decrease of her insulin by Dr. Glori Bickers on 08/22/20. She states she started the reduced insulin 08/23/20.  Goal is to taper off insulin.  Current antihyperglycemic regimen:  Lantus - Inject 18 units daily at bedtime  How often are you checking your blood sugar? before breakfast  What are your blood sugars ranging?  Fasting:  08/23/20- 125 08/24/20- 106 08/25/20- 132 08/26/20- 120 08/27/20 -  106 08/28/20 -  120 08/29/20 -  135  On insulin? Yes- Lantus How many units: 18 units (recently reduced from 22 units by Dr. Glori Bickers) What time of day: Bedtime  Patient states she is feeling well and would be interested in reducing insulin more if Dr. Glori Bickers feels that is appropriate.  Follow-Up:  Pharmacist Review  Debbora Dus, CPP notified  Margaretmary Dys, [place] Assistant [phone]  Contacted patient to discuss BG log. Since the goal is come off insulin (and consider metformin if needed), recommend decreasing insulin from 18 units to 15 units today. Check BG before breakfast and 2 hours after first bite of largest meal for next 7 days. She will contact me with any fasting BG > 150, or post-prandial > 200. CMA will follow up for BG log  next week on 09/05/20.  Debbora Dus, PharmD Clinical Pharmacist [place] [facility] at [facility] [phone]

## 2020-08-29 NOTE — Telephone Encounter (Signed)
That sounds great. Thanks.

## 2020-08-30 ENCOUNTER — Other Ambulatory Visit (HOSPITAL_COMMUNITY): Payer: Self-pay | Admitting: Cardiology

## 2020-09-02 ENCOUNTER — Ambulatory Visit: Payer: Medicare Other | Admitting: Internal Medicine

## 2020-09-05 ENCOUNTER — Telehealth: Payer: Self-pay

## 2020-09-05 NOTE — Chronic Care Management (AMB) (Addendum)
After speaking with Debbora Dus, Pharm. D patient was contacted to explain that she could decrease to 10 units. Set up follow up call next week with patient to review BG readings.   Follow-Up:  Pharmacist Review  Debbora Dus, CPP notified  Margaretmary Dys, [place] Assistant [phone]  I have reviewed the care management and care coordination activities outlined in this encounter and I am certifying that I agree with the content of this note. No further action required.  Debbora Dus, PharmD Clinical Pharmacist [place] [facility] at [facility] [phone]

## 2020-09-05 NOTE — Chronic Care Management (AMB) (Addendum)
Chronic Care Management Pharmacy Assistant   Name: Kelli Velasquez  MRN: [id number] DOB: [birth year]  Reason for Encounter: Disease State- Blood glucose log update  PCP : Abner Greenspan, MD  Allergies:   Allergies  Allergen Reactions   Fish Allergy Anaphylaxis   Shellfish Allergy Anaphylaxis   Aspirin     REACTION: nausea and vomiting High doses   Atorvastatin     REACTION: leg pain   Crestor [Rosuvastatin Calcium] Other (See Comments)    Muscle pain - allergy/intolerance   Penicillins     Due to mold allergy per pt   Simvastatin     REACTION: muscle pain   Trandolapril     REACTION: leg pain    Medications: Outpatient Encounter Medications as of 09/05/2020  Medication Sig   furosemide (LASIX) 20 MG tablet TAKE 1 TABLET BY MOUTH EVERY OTHER DAY   acetaminophen (TYLENOL) 325 MG tablet Take 650 mg by mouth every 6 (six) hours as needed for mild pain. Take as needed per bottle    albuterol (PROAIR HFA) 108 (90 Base) MCG/ACT inhaler INHALE 2 PUFFS BY MOUTH EVERY 4 HOURS AS NEEDED FOR WHEEZING OR FOR SHORTNESS OF BREATH   aspirin 81 MG tablet Take 81 mg by mouth daily.   bisoprolol (ZEBETA) 5 MG tablet TAKE 1.5 TABLETS (7.5 MG TOTAL) BY MOUTH DAILY.   chlorpheniramine (CHLOR-TRIMETON) 4 MG tablet Take 4 mg by mouth 2 (two) times daily as needed for allergies.   clopidogrel (PLAVIX) 75 MG tablet TAKE 1 TABLET BY MOUTH EVERY DAY   clotrimazole-betamethasone (LOTRISONE) cream Apply 1 application topically 2 (two) times daily as needed. To affected area/rash   cyanocobalamin (,VITAMIN B-12,) 1000 MCG/ML injection Inject 1,000 mcg into the muscle every 3 (three) months.   ENTRESTO 97-103 MG TAKE 1 TABLET BY MOUTH TWICE A DAY   EPINEPHrine (EPIPEN 2-PAK) 0.3 mg/0.3 mL IJ SOAJ injection INJECT 0.3 MLS (0.3 MG TOTAL) INTO THE MUSCLE ONCE AS NEEDED FOR ALLERGIC REACTION   erythromycin ophthalmic ointment USE SMALL AMOUNT TO AFFECTED EYE AS NEEDED   esomeprazole (NEXIUM) 40 MG capsule  TAKE 1 CAPSULE BY MOUTH EVERY DAY   Evolocumab (REPATHA SURECLICK) 175 MG/ML SOAJ Inject 1 Dose into the skin every 14 (fourteen) days.   ezetimibe (ZETIA) 10 MG tablet TAKE 1 TABLET BY MOUTH EVERY DAY   famotidine (PEPCID) 20 MG tablet Take 40 mg by mouth daily.   insulin glargine (LANTUS) 100 UNIT/ML injection INJECT 22 UNITS INTO THE SKIN DAILY.  (Dx. Code: E11.69, E66.9)   methocarbamol (ROBAXIN) 500 MG tablet Take 1 tablet (500 mg total) by mouth every 8 (eight) hours as needed for muscle spasms.   mometasone (ELOCON) 0.1 % cream Apply 1 application topically daily.   OXYGEN 24/7 3l [employer]   No facility-administered encounter medications on file as of 09/05/2020.    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[place])    Hypoxemia    Encounter for screening mammogram for breast cancer 02/02/2017  Chronic combined systolic and diastolic CHF, NYHA class 2 ([place]) 02/02/2017   Depressed mood 12/02/2016   Routine general medical examination at a health care facility 35/00/9381   Supraumbilical hernia 82/99/3716   Lump in the abdomen 04/10/2015   GERD (gastroesophageal reflux disease) 10/10/2014   B12 deficiency 10/09/2014   Cataracts, bilateral 10/09/2014   Dry eyes 10/09/2014   Fatigue 05/08/2014   Pedal edema 05/08/2014   Osteopenia 11/28/2013   Estrogen deficiency 11/02/2013   Dyspnea on exertion 06/27/2013   Hernia, incisional 04/03/2013   Pulmonary nodules c/w Nodular BOOP 02/02/2013   Cough  12/18/2012   Chronic respiratory failure with hypoxia and hypercapnia ([place]) 10/26/2012   Special screening for malignant neoplasms, colon 06/16/2011   CAD, NATIVE VESSEL 08/19/2009   MYOCARDIAL INFARCTION, SUBENDOCARDIAL, INITIAL EPISODE 08/07/2009   Morbid obesity due to excess calories ([place]) comp by hbp/ hyperlipidemia/ dm/ihd 09/01/2007   MYOPIA 03/03/2007   Hyperlipidemia associated with type 2 diabetes mellitus ([place]) 10/15/2006   Essential hypertension 10/15/2006   Allergic rhinitis 10/15/2006   Cough variant asthma vs UACS  10/15/2006   OVERACTIVE BLADDER 10/15/2006   OSTEOARTHRITIS 10/15/2006   Urinary incontinence 10/15/2006    Recent Relevant Labs: Lab Results  Component Value Date/Time   HGBA1C 5.5 07/10/2019 09:29 AM   HGBA1C 5.4 07/06/2018 12:16 PM    Kidney Function Lab Results  Component Value Date/Time   CREATININE 1.09 (H) 05/28/2020 03:44 PM   CREATININE 1.41 (H) 01/26/2020 11:47 AM   CREATININE 1.01 (H) 12/16/2015 11:42 AM   CREATININE 0.79 11/18/2015 11:06 AM   GFR 39.12 (L) 07/10/2019 09:29 AM   GFRNONAA 51 (L) 05/28/2020 03:44 PM   GFRAA 41 (L) 01/26/2020 11:47 AM    Current antihyperglycemic regimen:  Lantus - Inject 15 units daily at bedtime (Decreased from 18 units on 08/30/20)  How often are you checking your blood sugar? twice daily  What are your blood sugars ranging?   Fasting:  08/30/20-107 08/31/20-123 09/01/20-109 09/02/20-117 09/03/20-134 09/04/20-115 09/05/20-111 Before meals: N/A After meals: 2 hours after evening meal 08/30/20-121 08/31/20-124 09/01/20-120 09/02/20-134 09/03/20-150 -States she had pizza for dinner 09/04/20-134  Bedtime: N/A  On insulin? Yes - Lantus How many units: 15 units at bedtime   Adherence Review: Is the patient currently on a STATIN medication? No Is the patient currently on ACE/ARB medication? Yes Does the patient have >5 day gap between last estimated fill dates? CPP to review  Patient very excited about her  readings and would like to decrease insulin even more if appropriate.   Follow-Up:  Pharmacist Review  Debbora Dus, CPP notified  Margaretmary Dys, [place] Assistant [phone]  I have reviewed the care management and care coordination activities outlined in this encounter and I am certifying that I agree with the content of this note. Patient may reduce insulin to 10 units. Dr. Glori Bickers agrees with taper per previous discussion.  Debbora Dus, PharmD Clinical Pharmacist [place] [facility] at [facility] [phone]

## 2020-09-12 NOTE — Chronic Care Management (AMB) (Addendum)
Contacted patient to get update on BG readings since decreasing to 10 units. Patient was at hair salon and asked that I call her husband to get log. Husband reported the following readings:   What are your blood sugars ranging?    Fasting:  09/05/20-111 09/06/20-117 09/07/20-111 09/08/20-114 09/09/20-121 09/10/20-127 09/11/20-117 09/12/20-127 Before meals: N/A After meals: 2 hours after evening meal 09/05/20-117 09/06/20-145 09/07/20-151 09/08/20-107 09/09/20-139 09/10/20-111 09/11/20-156- States she had large dessert   Readings were relayed to Debbora Dus, Pharm. D for review. She advised that patient decrease to 5 units of insulin daily. Patients husband advised of change. Will follow up with patient next week on 09/19/20.  Follow-Up:  Pharmacist Review  Debbora Dus, CPP notified  Margaretmary Dys, [place] Assistant [phone]  I have reviewed the care management and care coordination activities outlined in this encounter and I am certifying that I agree with the content of this note. No further action required.  Debbora Dus, PharmD Clinical Pharmacist [place] [facility] at [facility] [phone]

## 2020-09-15 DIAGNOSIS — R0902 Hypoxemia: Secondary | ICD-10-CM | POA: Diagnosis not present

## 2020-09-19 NOTE — Chronic Care Management (AMB) (Addendum)
Contacted Ms. Allebach, explained that per Debbora Dus, Pharm D. she may stop the insulin. Will follow back up with her next Thursday to review BG readings.    Follow-Up:  Pharmacist Review  Debbora Dus, CPP notified  Margaretmary Dys, [place] Pharmacy Assistant [phone]

## 2020-09-19 NOTE — Chronic Care Management (AMB) (Addendum)
Contacted Kelli Velasquez to review blood sugar readings since she has decreased to 5 units of Lantus. Readings are as follows:      AM Fasting After Evening meal 09/13/20-  122  159 09/14/20-  126  178 09/15/20-  127  158 09/16/20-  134  202 09/17/20-  128  136 09/18/20-  149  145 09/19/20-  134  Readings relayed to Debbora Dus, Pharm. D, to advise.   Follow-Up:  Pharmacist Review  Debbora Dus, CPP notified  Margaretmary Dys, [place] Pharmacy Assistant [phone]  Patient may stop her insulin. Will consider metformin at 1 week BG log review.  Debbora Dus, PharmD Clinical Pharmacist [place] [facility] at [facility] [phone]

## 2020-09-20 ENCOUNTER — Encounter: Payer: Self-pay | Admitting: Internal Medicine

## 2020-09-20 ENCOUNTER — Ambulatory Visit: Payer: Medicare Other | Admitting: Internal Medicine

## 2020-09-20 ENCOUNTER — Other Ambulatory Visit: Payer: Self-pay

## 2020-09-20 VITALS — BP 117/68 | HR 67 | Ht 65.5 in | Wt 204.7 lb

## 2020-09-20 DIAGNOSIS — E1169 Type 2 diabetes mellitus with other specified complication: Secondary | ICD-10-CM | POA: Diagnosis not present

## 2020-09-20 DIAGNOSIS — T466X5A Adverse effect of antihyperlipidemic and antiarteriosclerotic drugs, initial encounter: Secondary | ICD-10-CM

## 2020-09-20 DIAGNOSIS — E785 Hyperlipidemia, unspecified: Secondary | ICD-10-CM | POA: Diagnosis not present

## 2020-09-20 DIAGNOSIS — I5042 Chronic combined systolic (congestive) and diastolic (congestive) heart failure: Secondary | ICD-10-CM

## 2020-09-20 DIAGNOSIS — I2510 Atherosclerotic heart disease of native coronary artery without angina pectoris: Secondary | ICD-10-CM | POA: Diagnosis not present

## 2020-09-20 DIAGNOSIS — M791 Myalgia, unspecified site: Secondary | ICD-10-CM

## 2020-09-20 NOTE — Patient Instructions (Signed)
Medication Instructions:  Your physician recommends that you continue on your current medications as directed. Please refer to the Current Medication list given to you today.  *If you need a refill on your cardiac medications before your next appointment, please call your pharmacy*  Lab Work: Please return for FASTING labs in 1 year (prior to appointment with Dr. Debara Pickett) (Lipid)  Our in office lab hours are Monday-Friday 8:00-4:00, closed for lunch 12:45-1:45 pm.  No appointment needed.  Follow-Up: At [facility], you and your health needs are our priority.  As part of our continuing mission to provide you with exceptional heart care, we have created designated Provider Care Teams.  These Care Teams include your primary Cardiologist (physician) and Advanced Practice Providers (APPs -  Physician Assistants and Nurse Practitioners) who all work together to provide you with the care you need, when you need it.  We recommend signing up for the patient portal called "MyChart".  Sign up information is provided on this After Visit Summary.  MyChart is used to connect with patients for Virtual Visits (Telemedicine).  Patients are able to view lab/test results, encounter notes, upcoming appointments, etc.  Non-urgent messages can be sent to your provider as well.   To learn more about what you can do with MyChart, go to [email or web address].    Your next appointment:   12 month(s)  The format for your next appointment:   In Person  Provider:   K. Mali Hilty, MD

## 2020-09-20 NOTE — Progress Notes (Signed)
LIPID CLINIC CONSULT NOTE  Chief Complaint:  Follow-up dyslipidemia  Primary Care Physician: Tower, Wynelle Fanny, MD  HPI:  Kelli Velas[facility] is a 80 y.o. female who is being seen today for the evaluation of dyslipidemia at the request of Dr. Aundra Dubin.  This is a pleasant 80 year old female was kindly referred for evaluation of dyslipidemia.  She has a prior history of coronary artery disease with cath in January 2011 and had placement of drug-eluting stents in the LAD and RCA.  She is an established patient of Dr. Aundra Dubin in the heart failure clinic and has an ischemic cardiomyopathy with LVEF 40 to 45%.  In addition she has dyslipidemia which is not at goal.  She has been intolerant to statins, including atorvastatin, rosuvastatin, simvastatin and most recently pravastatin.  Her most recent lipid profile showed total cholesterol 188, triglycerides 305, HDL 40 and LDL 87, however this is on pravastatin however she recently discontinued it again due to myalgias.  She is referred for evaluation of possible PCSK9 inhibitor therapy.  06/27/2019  Kelli Velasquez returns today for follow-up and monitoring of Repatha.  She is tolerating it well.  She denies any side effects that she had with the statins.  Labs were drawn this morning and showed total cholesterol is now down to 109, triglycerides 203, HDL 45 and LDL of 32.  Overall significant improvement.  Her blood pressure is well controlled at 127/63.  She denies any significant worsening shortness of breath but does have a history of heart failure and had been followed by Dr. Aundra Dubin.  I encouraged her to make a follow-up with him.  She is on ezetimibe as well.  09/20/2020  Kelli Velasquez is seen today for follow-up of dyslipidemia.  She continues to do well on Repatha.  Her lipids have remained low with total cholesterol 118, HDL 43, LDL 38 and triglycerides 187.  She apparently also has had improvement in her LVEF now up above 65% and is followed by Dr. Aundra Dubin in the  heart failure clinic.  She remains on ezetimibe as well.  PMHx:   Past Medical History:  Diagnosis Date  . Allergy    allergic rhinitis  . Asthma   . Chronic bronchitis ([place])   . Coronary artery disease    cath January 2011 with DEs LAD and RCA  . Dyspnea   . Full dentures   . Gallstones   . GERD (gastroesophageal reflux disease)   . History of echocardiogram    Echo 5/17: mod LVH, EF 50-55%, ant-septal HK, Gr 1 DD, mod LAE //  b.  Echo 7/17: mild concentric LVH, EF 45-50%, inf-lat, inf, inf-septal HK, mild LAE  . History of nuclear stress test    Myoview 7/17: EF 48%, small mild apical defect, no ischemia, low risk  . Hyperlipidemia   . Hypertension   . 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 Myopia   . 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 Nocturnal oxygen desaturation    o2 at night  . Obesity   . Osteoarthritis    knees, fingers, shoulders  . Overactive bladder   . Oxygen deficiency    uses oxygen all day  . Rash    and other non specific skin eruptions  . Retaining fluid    in ankles and feet  . Urinary incontinence   . Vertigo   . Wears glasses  Past Surgical History:  Procedure Laterality Date  . CATARACT EXTRACTION W/PHACO Right 12/16/2016   Procedure: CATARACT EXTRACTION PHACO AND INTRAOCULAR LENS PLACEMENT ([place])  right;  Surgeon: Leandrew Koyanagi, MD;  Location: [facility];  Service: Ophthalmology;  Laterality: Right;  . CATARACT EXTRACTION W/PHACO Left 02/03/2017   Procedure: CATARACT EXTRACTION PHACO AND INTRAOCULAR LENS PLACEMENT ([place])  Left  Complicated;  Surgeon: Leandrew Koyanagi, MD;  Location: [facility];  Service: Ophthalmology;  Laterality: Left;  [place] Uses oxygen   . CHOLECYSTECTOMY  92  . COLONOSCOPY    . CORONARY ANGIOGRAPHY N/A 06/16/2017   Procedure: CORONARY ANGIOGRAPHY;  Surgeon: Larey Dresser, MD;  Location: [facility];   Service: Cardiovascular;  Laterality: N/A;  . CORONARY ANGIOPLASTY WITH STENT PLACEMENT  07/2009   stent  . CORONARY STENT INTERVENTION N/A 06/16/2017   Procedure: CORONARY STENT INTERVENTION;  Surgeon: Wellington Hampshire, MD;  Location: [facility];  Service: Cardiovascular;  Laterality: N/A;  . DILATION AND CURETTAGE OF UTERUS  1984  . INCISIONAL HERNIA REPAIR N/A 05/16/2013   Procedure: HERNIA REPAIR INFRAUMILICAL INCISIONAL;  Surgeon: Joyice Faster. Cornett, MD;  Location: [facility];  Service: General;  Laterality: N/A;  umbilical  . INSERTION OF MESH N/A 05/16/2013   Procedure: INSERTION OF MESH;  Surgeon: Joyice Faster. Cornett, MD;  Location: [facility];  Service: General;  Laterality: N/A;  umbilical  . JOINT REPLACEMENT  2007   right total knee replacement  . RIGHT/LEFT HEART CATH AND CORONARY ANGIOGRAPHY N/A 06/16/2017   Procedure: RIGHT/LEFT HEART CATH AND CORONARY ANGIOGRAPHY;  Surgeon: Larey Dresser, MD;  Location: [facility];  Service: Cardiovascular;  Laterality: N/A;  . TOTAL KNEE ARTHROPLASTY  2010   left , then vocal cord infection post op  . VIDEO BRONCHOSCOPY Bilateral 07/05/2013   Procedure: VIDEO BRONCHOSCOPY WITH FLUORO;  Surgeon: Tanda Rockers, MD;  Location: [facility];  Service: Cardiopulmonary;  Laterality: Bilateral;  . vocal cord polypectomy      FAMHx:  Family History  Problem Relation Age of Onset  . Stroke Mother   . Diabetes Mother 69  . Stroke Father   . Heart failure Father   . Breast cancer Maternal Grandmother   . Colon cancer Neg Hx     SOCHx:   reports that she quit smoking about 38 years ago. Her smoking use included cigarettes. She has a 25.00 pack-year smoking history. She has never used smokeless tobacco. She reports current alcohol use. She reports that she does not use drugs.  ALLERGIES:  Allergies  Allergen Reactions  . Fish Allergy Anaphylaxis  . Shellfish Allergy Anaphylaxis  . Aspirin      REACTION: nausea and vomiting High doses  . Atorvastatin     REACTION: leg pain  . Crestor [Rosuvastatin Calcium] Other (See Comments)    Muscle pain - allergy/intolerance  . Penicillins     Due to mold allergy per pt  . Simvastatin     REACTION: muscle pain  . Trandolapril     REACTION: leg pain    ROS: Pertinent items noted in HPI and remainder of comprehensive ROS otherwise negative.  HOME MEDS: Current Outpatient Medications on File Prior to Visit  Medication Sig Dispense Refill  . acetaminophen (TYLENOL) 325 MG tablet Take 650 mg by mouth every 6 (six) hours as needed for mild pain. Take as needed per bottle    . albuterol (PROAIR HFA) 108 (90 Base) MCG/ACT inhaler  INHALE 2 PUFFS BY MOUTH EVERY 4 HOURS AS NEEDED FOR WHEEZING OR FOR SHORTNESS OF BREATH 8 Inhaler 5  . aspirin 81 MG tablet Take 81 mg by mouth daily.    . bisoprolol (ZEBETA) 5 MG tablet TAKE 1.5 TABLETS (7.5 MG TOTAL) BY MOUTH DAILY. 135 tablet 2  . chlorpheniramine (CHLOR-TRIMETON) 4 MG tablet Take 4 mg by mouth 2 (two) times daily as needed for allergies.    Marland Kitchen clopidogrel (PLAVIX) 75 MG tablet TAKE 1 TABLET BY MOUTH EVERY DAY 90 tablet 1  . clotrimazole-betamethasone (LOTRISONE) cream Apply 1 application topically 2 (two) times daily as needed. To affected area/rash 30 g 1  . cyanocobalamin (,VITAMIN B-12,) 1000 MCG/ML injection Inject 1,000 mcg into the muscle every 3 (three) months.    . ENTRESTO 97-103 MG TAKE 1 TABLET BY MOUTH TWICE A DAY 60 tablet 6  . EPINEPHrine (EPIPEN 2-PAK) 0.3 mg/0.3 mL IJ SOAJ injection INJECT 0.3 MLS (0.3 MG TOTAL) INTO THE MUSCLE ONCE AS NEEDED FOR ALLERGIC REACTION 2 Device 1  . erythromycin ophthalmic ointment USE SMALL AMOUNT TO AFFECTED EYE AS NEEDED    . esomeprazole (NEXIUM) 40 MG capsule TAKE 1 CAPSULE BY MOUTH EVERY DAY 90 capsule 0  . Evolocumab (REPATHA SURECLICK) 409 MG/ML SOAJ Inject 1 Dose into the skin every 14 (fourteen) days. 2 mL 2  . ezetimibe (ZETIA) 10 MG tablet  TAKE 1 TABLET BY MOUTH EVERY DAY 90 tablet 1  . famotidine (PEPCID) 20 MG tablet Take 40 mg by mouth daily.    . furosemide (LASIX) 20 MG tablet TAKE 1 TABLET BY MOUTH EVERY OTHER DAY 45 tablet 1  . methocarbamol (ROBAXIN) 500 MG tablet Take 1 tablet (500 mg total) by mouth every 8 (eight) hours as needed for muscle spasms. 90 tablet 0  . mometasone (ELOCON) 0.1 % cream Apply 1 application topically daily. 15 g 3  . OXYGEN 24/7 3l [employer]     No current facility-administered medications on file prior to visit.    LABS/IMAGING: No results found for this or any previous visit (from the past 48 hour(s)). No results found.  LIPID PANEL:    Component Value Date/Time   CHOL 118 05/28/2020 1544   CHOL 109 06/27/2019 0936   TRIG 187 (H) 05/28/2020 1544   HDL 43 05/28/2020 1544   HDL 45 06/27/2019 0936   CHOLHDL 2.7 05/28/2020 1544   VLDL 37 05/28/2020 1544   LDLCALC 38 05/28/2020 1544   LDLCALC 32 06/27/2019 0936   LDLDIRECT 121.0 07/06/2018 1216    WEIGHTS: Wt Readings from Last 3 Encounters:  09/20/20 204 lb 11.2 oz (92.9 kg)  05/28/20 212 lb (96.2 kg)  02/05/20 218 lb 6.4 oz (99.1 kg)    VITALS: BP 117/68   Pulse 67   Ht 5' 5.5" (1.664 m)   Wt 204 lb 11.2 oz (92.9 kg)   LMP 07/27/1980   SpO2 98%   BMI 33.55 kg/m   EXAM: Deferred  EKG: Deferred  ASSESSMENT: 1. Coronary artery disease status post PCI x2 (2011) 2. Ischemic cardiomyopathy EF 40 to 45% 3. Mixed dyslipidemia -goal LDL less than 70 4. Statin intolerant - myalgias  PLAN: 1.   Kelli Velasquez continues to do well on Repatha.  She is at target LDL less than 70.  She is on a combination of ezetimibe with the Repatha.  She is tolerating the injections.  We will make sure that they are reauthorized for another year.  Unfortunately she cannot tolerate even  low-dose statin medications.  Plan follow-up with me annually or sooner as necessary.  Pixie Casino, MD, [facility], [place]  Director of the Advanced Lipid Disorders &  Cardiovascular Risk Reduction Clinic Diplomate of the American Board of Clinical Lipidology Attending Cardiologist  Direct Dial: [phone]  Fax: [phone]  Website:  [email or web address] 09/20/2020, 10:29 AM

## 2020-09-25 ENCOUNTER — Other Ambulatory Visit: Payer: Self-pay

## 2020-09-25 ENCOUNTER — Other Ambulatory Visit: Payer: Self-pay | Admitting: Family Medicine

## 2020-09-25 ENCOUNTER — Other Ambulatory Visit: Payer: Self-pay | Admitting: Internal Medicine

## 2020-09-25 ENCOUNTER — Ambulatory Visit (INDEPENDENT_AMBULATORY_CARE_PROVIDER_SITE_OTHER): Payer: Medicare Other

## 2020-09-25 DIAGNOSIS — E669 Obesity, unspecified: Secondary | ICD-10-CM | POA: Diagnosis not present

## 2020-09-25 DIAGNOSIS — E1169 Type 2 diabetes mellitus with other specified complication: Secondary | ICD-10-CM | POA: Diagnosis not present

## 2020-09-25 NOTE — Patient Instructions (Addendum)
Dear Kelli [facility]quez,  Below is a summary of the goals we discussed during our follow up appointment on September 25, 2020. Please contact me anytime with questions or concerns.   Visit Information  Patient Care Plan: CCM Pharmacy Care Plan    Problem Identified: CHL AMB "PATIENT-SPECIFIC PROBLEM"     Long-Range Goal: Disease Management   Start Date: 09/25/2020  Priority: High  Note:    Current Barriers:  . None identified  Pharmacist Clinical Goal(s):  Marland Kitchen Over the next 60 days, patient will maintain control of diabetes as evidenced by home blood glucose within goal - fasting 80-130 and 2 hours after meals < 180  through collaboration with PharmD and provider.   Interventions: . 1:1 collaboration with Tower, Wynelle Fanny, MD regarding development and update of comprehensive plan of care as evidenced by provider attestation and co-signature . Inter-disciplinary care team collaboration (see longitudinal plan of care) . Comprehensive medication review performed; medication list updated in electronic medical record  Diabetes (A1c goal <7%) -Controlled -Current medications: . No pharmacotherapy -Medications previously tried: Lantus  Contacted patient to review diabetes control with recent taper off insulin. Currently off insulin for 6 days. Patient is doing well. Reports feeling better overall. She is checking twice daily before breakfast and 2 hours after meal. -Home blood glucose log: Starting Friday - 10/19/20  Before breakfast -  113, 123, 120, 121, 133, 140  2 hours after meal - 120, 213 (dessert), 114, 150, 109 -Denies hypoglycemic/hyperglycemic symptoms Plan: She will remain off insulin. Hold off on starting metformin for now. Will review A1c with labs 10/30/20. Check fasting BG every other day until follow up. Asked her to call with any BG elevations.  Other: She also asked about dryness in nostrils from oxygen tubes. Suggested a saline nasal spray. Applies ointment to nostril for chapped  nose, Clear moisture barrier ointment.   Patient Goals/Self-Care Activities . Over the next 60 days, patient will:  - check glucose every other day before breakfast, document, and provide at future appointments  Follow up: PCP November 06, 2020; CMA call in May 2022 for DM and HF review      Debbora Dus, PharmD Clinical Pharmacist [facility] [phone]   Diabetes Mellitus and Nutrition, Adult When you have diabetes, or diabetes mellitus, it is very important to have healthy eating habits because your blood sugar (glucose) levels are greatly affected by what you eat and drink. Eating healthy foods in the right amounts, at about the same times every day, can help you:  Control your blood glucose.  Lower your risk of heart disease.  Improve your blood pressure.  Reach or maintain a healthy weight. What can affect my meal plan? Every person with diabetes is different, and each person has different needs for a meal plan. Your health care provider may recommend that you work with a dietitian to make a meal plan that is best for you. Your meal plan may vary depending on factors such as:  The calories you need.  The medicines you take.  Your weight.  Your blood glucose, blood pressure, and cholesterol levels.  Your activity level.  Other health conditions you have, such as heart or kidney disease. How do carbohydrates affect me? Carbohydrates, also called carbs, affect your blood glucose level more than any other type of food. Eating carbs naturally raises the amount of glucose in your blood. Carb counting is a method for keeping track of how many carbs you  eat. Counting carbs is important to keep your blood glucose at a healthy level, especially if you use insulin or take certain oral diabetes medicines. It is important to know how many carbs you can safely have in each meal. This is different for every person. Your dietitian can help you calculate how  many carbs you should have at each meal and for each snack. How does alcohol affect me? Alcohol can cause a sudden decrease in blood glucose (hypoglycemia), especially if you use insulin or take certain oral diabetes medicines. Hypoglycemia can be a life-threatening condition. Symptoms of hypoglycemia, such as sleepiness, dizziness, and confusion, are similar to symptoms of having too much alcohol.  Do not drink alcohol if: ? Your health care provider tells you not to drink. ? You are pregnant, may be pregnant, or are planning to become pregnant.  If you drink alcohol: ? Do not drink on an empty stomach. ? Limit how much you use to:  0-1 drink a day for women.  0-2 drinks a day for men. ? Be aware of how much alcohol is in your drink. In the [place] one drink equals one 12 oz bottle of beer (355 mL), one 5 oz glass of wine (148 mL), or one 1 oz glass of hard liquor (44 mL). ? Keep yourself hydrated with water, diet soda, or unsweetened iced tea.  Keep in mind that regular soda, juice, and other mixers may contain a lot of sugar and must be counted as carbs. What are tips for following this plan? Reading food labels  Start by checking the serving size on the "Nutrition Facts" label of packaged foods and drinks. The amount of calories, carbs, fats, and other nutrients listed on the label is based on one serving of the item. Many items contain more than one serving per package.  Check the total grams (g) of carbs in one serving. You can calculate the number of servings of carbs in one serving by dividing the total carbs by 15. For example, if a food has 30 g of total carbs per serving, it would be equal to 2 servings of carbs.  Check the number of grams (g) of saturated fats and trans fats in one serving. Choose foods that have a low amount or none of these fats.  Check the number of milligrams (mg) of salt (sodium) in one serving. Most people should limit total sodium intake to less than  2,300 mg per day.  Always check the nutrition information of foods labeled as "low-fat" or "nonfat." These foods may be higher in added sugar or refined carbs and should be avoided.  Talk to your dietitian to identify your daily goals for nutrients listed on the label. Shopping  Avoid buying canned, pre-made, or processed foods. These foods tend to be high in fat, sodium, and added sugar.  Shop around the outside edge of the grocery store. This is where you will most often find fresh fruits and vegetables, bulk grains, fresh meats, and fresh dairy. Cooking  Use low-heat cooking methods, such as baking, instead of high-heat cooking methods like deep frying.  Cook using healthy oils, such as olive, canola, or sunflower oil.  Avoid cooking with butter, cream, or high-fat meats. Meal planning  Eat meals and snacks regularly, preferably at the same times every day. Avoid going long periods of time without eating.  Eat foods that are high in fiber, such as fresh fruits, vegetables, beans, and whole grains. Talk with your dietitian about how  many servings of carbs you can eat at each meal.  Eat 4-6 oz (112-168 g) of lean protein each day, such as lean meat, chicken, fish, eggs, or tofu. One ounce (oz) of lean protein is equal to: ? 1 oz (28 g) of meat, chicken, or fish. ? 1 egg. ?  cup (62 g) of tofu.  Eat some foods each day that contain healthy fats, such as avocado, nuts, seeds, and fish.   What foods should I eat? Fruits Berries. Apples. Oranges. Peaches. Apricots. Plums. Grapes. Mango. Papaya. Pomegranate. Kiwi. Cherries. Vegetables Lettuce. Spinach. Leafy greens, including kale, chard, collard greens, and mustard greens. Beets. Cauliflower. Cabbage. Broccoli. Carrots. Green beans. Tomatoes. Peppers. Onions. Cucumbers. Brussels sprouts. Grains Whole grains, such as whole-wheat or whole-grain bread, crackers, tortillas, cereal, and pasta. Unsweetened oatmeal. Quinoa. Brown or wild  rice. Meats and other proteins Seafood. Poultry without skin. Lean cuts of poultry and beef. Tofu. Nuts. Seeds. Dairy Low-fat or fat-free dairy products such as milk, yogurt, and cheese. The items listed above may not be a complete list of foods and beverages you can eat. Contact a dietitian for more information. What foods should I avoid? Fruits Fruits canned with syrup. Vegetables Canned vegetables. Frozen vegetables with butter or cream sauce. Grains Refined white flour and flour products such as bread, pasta, snack foods, and cereals. Avoid all processed foods. Meats and other proteins Fatty cuts of meat. Poultry with skin. Breaded or fried meats. Processed meat. Avoid saturated fats. Dairy Full-fat yogurt, cheese, or milk. Beverages Sweetened drinks, such as soda or iced tea. The items listed above may not be a complete list of foods and beverages you should avoid. Contact a dietitian for more information. Questions to ask a health care provider  Do I need to meet with a diabetes educator?  Do I need to meet with a dietitian?  What number can I call if I have questions?  When are the best times to check my blood glucose? Where to find more information:  American Diabetes Association: diabetes.org  Academy of Nutrition and Dietetics: www.eatright.[employer] of Diabetes and Digestive and Kidney Diseases: [employer]dk  Association of Diabetes Care and Education Specialists: www.diabeteseducator.org Summary  It is important to have healthy eating habits because your blood sugar (glucose) levels are greatly affected by what you eat and drink.  A healthy meal plan will help you control your blood glucose and maintain a healthy lifestyle.  Your health care provider may recommend that you work with a dietitian to make a meal plan that is best for you.  Keep in mind that carbohydrates (carbs) and alcohol have immediate effects on your blood glucose levels.  It is important to count carbs and to use alcohol carefully. This information is not intended to replace advice given to you by your health care provider. Make sure you discuss any questions you have with your health care provider. Document Revised: 06/20/2019 Document Reviewed: 06/20/2019 Elsevier Patient Education  2021 [employer].

## 2020-09-25 NOTE — Progress Notes (Addendum)
Chronic Care Management Pharmacy Note  09/25/2020 Name:  Kelli Velasquez MRN:  [id number] DOB:  March 26, 1941  Subjective: Kelli Velasquez is an 80 y.o. year old female who is a primary patient of Tower, Wynelle Fanny, MD.  The CCM team was consulted for assistance with disease management and care coordination needs.    Engaged with patient by telephone for follow up visit in response to provider referral for pharmacy case management and/or care coordination services.   Consent to Services:  The patient was given information about Chronic Care Management services, agreed to services, and gave verbal consent prior to initiation of services.  Please see initial visit note for detailed documentation.   Patient Care Team: Tower, Wynelle Fanny, MD as PCP - General Debbora Dus, [facility] as Pharmacist (Pharmacist)  Hospital visits: None in previous 6 months  Objective:  Lab Results  Component Value Date   CREATININE 1.09 (H) 05/28/2020   BUN 19 05/28/2020   GFR 39.12 (L) 07/10/2019   GFRNONAA 51 (L) 05/28/2020   GFRAA 41 (L) 01/26/2020   NA 142 05/28/2020   K 4.5 05/28/2020   CALCIUM 9.0 05/28/2020   CO2 32 05/28/2020    Lab Results  Component Value Date/Time   HGBA1C 5.5 07/10/2019 09:29 AM   HGBA1C 5.4 07/06/2018 12:16 PM   GFR 39.12 (L) 07/10/2019 09:29 AM   GFR 47.50 (L) 07/06/2018 12:16 PM   Lab Results  Component Value Date   CHOL 118 05/28/2020   HDL 43 05/28/2020   LDLCALC 38 05/28/2020   LDLDIRECT 121.0 07/06/2018   TRIG 187 (H) 05/28/2020   CHOLHDL 2.7 05/28/2020    Hepatic Function Latest Ref Rng & Units 07/10/2019 07/06/2018 07/13/2017  Total Protein 6.0 - 8.3 g/dL 6.9 6.7 6.5  Albumin 3.5 - 5.2 g/dL 3.9 3.8 3.3(L)  AST 0 - 37 U/L _0 ALT 0 - 35 U/L _1 Alk Phosphatase 39 - 117 U/L 61 51 53  Total Bilirubin 0.2 - 1.2 mg/dL 0.3 0.3 0.4  Bilirubin, Direct 0.1 - 0.5 mg/dL - - -    Lab Results  Component Value Date/Time   TSH 2.53 07/10/2019 09:29 AM   TSH  2.25 07/06/2018 12:16 PM    CBC Latest Ref Rng & Units 07/10/2019 07/06/2018 08/27/2017  WBC 4.0 - 10.5 K/uL 11.2(H) 13.0(H) 19.7(H)  Hemoglobin 12.0 - 15.0 g/dL 12.1 11.6(L) 12.3  Hematocrit 36.0 - 46.0 % 37.7 35.7(L) 40.1  Platelets 150.0 - 400.0 K/uL 264.0 261.0 224    No results found for: VD25OH  Clinical ASCVD: Yes  The ASCVD Risk score Mikey Bussing DC Jr., et al., 2013) failed to calculate for the following reasons:   The 2013 ASCVD risk score is only valid for ages 33 to 1   The patient has a prior MI or stroke diagnosis    Depression screen [facility] 2/9 07/17/2019 07/06/2018 01/03/2018  Decreased Interest 0 0 1  Down, Depressed, Hopeless 0 0 1  PHQ - 2 Score 0 0 2  Altered sleeping - 2 0  Tired, decreased energy - 1 2  Change in appetite - 0 0  Feeling bad or failure about yourself  - 0 0  Trouble concentrating - 0 2  Moving slowly or fidgety/restless - 0 0  Suicidal thoughts - 0 0  PHQ-9 Score - 3 6  Difficult doing work/chores - Not difficult at all -  Some recent data might be hidden    Social History  Tobacco Use  Smoking Status Former Smoker  . Packs/day: 1.00  . Years: 25.00  . Pack years: 25.00  . Types: Cigarettes  . Quit date: 07/27/1982  . Years since quitting: 38.1  Smokeless Tobacco Never Used   BP Readings from Last 3 Encounters:  09/20/20 117/68  05/28/20 120/78  02/21/20 128/74   Pulse Readings from Last 3 Encounters:  09/20/20 67  05/28/20 68  02/21/20 75   Wt Readings from Last 3 Encounters:  09/20/20 204 lb 11.2 oz (92.9 kg)  05/28/20 212 lb (96.2 kg)  02/05/20 218 lb 6.4 oz (99.1 kg)    Assessment/Interventions: Review of patient past medical history, allergies, medications, health status, including review of consultants reports, laboratory and other test data, was performed as part of comprehensive evaluation and provision of chronic care management services.   SDOH:  (Social Determinants of Health) assessments and interventions performed:  Yes   CCM Care Plan  Allergies  Allergen Reactions  . Fish Allergy Anaphylaxis  . Shellfish Allergy Anaphylaxis  . Aspirin     REACTION: nausea and vomiting High doses  . Atorvastatin     REACTION: leg pain  . Crestor [Rosuvastatin Calcium] Other (See Comments)    Muscle pain - allergy/intolerance  . Penicillins     Due to mold allergy per pt  . Simvastatin     REACTION: muscle pain  . Trandolapril     REACTION: leg pain    Medications Reviewed Today    Reviewed by Debbora Dus, [facility] (Pharmacist) on 09/25/20 at [phone]  Med List Status: <None>  Medication Order Taking? Sig Documenting Provider Last Dose Status Informant  acetaminophen (TYLENOL) 325 MG tablet [id number] No Take 650 mg by mouth every 6 (six) hours as needed for mild pain. Take as needed per bottle [provider] Taking Active Nursing Home Medication Administration Guide (MAG)  albuterol [facility] HFA) 108 (90 Base) MCG/ACT inhaler [id number] No INHALE 2 PUFFS BY MOUTH EVERY 4 HOURS AS NEEDED FOR WHEEZING OR FOR SHORTNESS OF Donata Duff, MD Taking Active   aspirin 81 MG tablet [id number] No Take 81 mg by mouth daily. [provider] Taking Active   bisoprolol (ZEBETA) 5 MG tablet [id number] No TAKE 1.5 TABLETS (7.5 MG TOTAL) BY MOUTH DAILY. Larey Dresser, MD Taking Active   chlorpheniramine (CHLOR-TRIMETON) 4 MG tablet [id number] No Take 4 mg by mouth 2 (two) times daily as needed for allergies. [provider] Taking Active   clopidogrel (PLAVIX) 75 MG tablet [id number] No TAKE 1 TABLET BY MOUTH EVERY DAY Tower, Wynelle Fanny, MD Taking Active   clotrimazole-betamethasone (LOTRISONE) cream [id number] No Apply 1 application topically 2 (two) times daily as needed. To affected area/rash Tower, Wynelle Fanny, MD Taking Active   cyanocobalamin (,VITAMIN B-12,) 1000 MCG/ML injection [id number] No Inject 1,000 mcg into the muscle every 3 (three) months. [provider] Taking Active Nursing Home  Medication Administration Guide (MAG)  ENTRESTO 97-103 MG [id number] No TAKE 1 TABLET BY MOUTH TWICE A DAY Larey Dresser, MD Taking Active   EPINEPHrine (EPIPEN 2-PAK) 0.3 mg/0.3 mL IJ SOAJ injection [id number] No INJECT 0.3 MLS (0.3 MG TOTAL) INTO THE MUSCLE ONCE AS NEEDED FOR ALLERGIC REACTION Tower, Wynelle Fanny, MD Taking Active   erythromycin ophthalmic ointment [id number] No USE SMALL AMOUNT TO AFFECTED EYE AS NEEDED [provider] Taking Active   esomeprazole (NEXIUM) 40 MG capsule [id number] No TAKE 1 CAPSULE BY MOUTH EVERY DAY Tower, Wynelle Fanny, MD Taking Active  Evolocumab (REPATHA SURECLICK) 301 MG/ML SOAJ [id number] No Inject 1 Dose into the skin every 14 (fourteen) days. Pixie Casino, MD Taking Active   ezetimibe (ZETIA) 10 MG tablet [id number] No TAKE 1 TABLET BY MOUTH EVERY DAY Tower, Wynelle Fanny, MD Taking Active   famotidine (PEPCID) 20 MG tablet [id number] No Take 40 mg by mouth daily. [provider] Taking Active Self  furosemide (LASIX) 20 MG tablet [id number] No TAKE 1 TABLET BY MOUTH EVERY OTHER DAY Tower, Wynelle Fanny, MD Taking Active   methocarbamol (ROBAXIN) 500 MG tablet [id number] No Take 1 tablet (500 mg total) by mouth every 8 (eight) hours as needed for muscle spasms. Tower, Wynelle Fanny, MD Taking Active   mometasone (ELOCON) 0.1 % cream [id number] No Apply 1 application topically daily. Tower, Wynelle Fanny, MD Taking Active   OXYGEN [id number] No 24/7 3l Huey Romans [provider] Taking Active Nursing Home Medication Administration Guide ([place])  Med List Note Burnett Harry, CPhT 08/10/17 1614): [place].          Patient Active Problem List   Diagnosis Date Noted  . Acute cystitis 11/17/2019  . Renal insufficiency 07/17/2019  . History of arm fracture 03/21/2019  . Low back pain 08/23/2018  . Eczema 07/08/2018  . Hoarseness of voice 01/03/2018  . Coronary artery disease 07/07/2017  . Chronic systolic heart failure ([place]) 07/07/2017  .  Abnormal urinalysis 07/07/2017  . History of DVT (deep vein thrombosis) 07/07/2017  . Diabetes mellitus type 2 in obese ([place]) 07/07/2017  . 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 Hypoxemia   . Encounter for screening mammogram for breast cancer 02/02/2017  . Chronic combined systolic and diastolic CHF, NYHA class 2 ([place]) 02/02/2017  . Depressed mood 12/02/2016  . Routine general medical examination at a health care facility 12/30/2015  . Supraumbilical hernia 29/93/7169  . Lump in the abdomen 04/10/2015  . GERD (gastroesophageal reflux disease) 10/10/2014  . B12 deficiency 10/09/2014  . Cataracts, bilateral 10/09/2014  . Dry eyes 10/09/2014  . Fatigue 05/08/2014  . Pedal edema 05/08/2014  . Osteopenia 11/28/2013  . Estrogen deficiency 11/02/2013  . Dyspnea on exertion 06/27/2013  . Hernia, incisional 04/03/2013  . 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 Cough 12/18/2012  . Chronic respiratory failure with hypoxia and hypercapnia ([place]) 10/26/2012  . Special screening for malignant neoplasms, colon 06/16/2011  . CAD, NATIVE VESSEL 08/19/2009  . 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 Morbid obesity due to excess calories ([place]) comp by hbp/ hyperlipidemia/ dm/ihd 09/01/2007  . MYOPIA 03/03/2007  . Hyperlipidemia associated with type 2 diabetes mellitus ([place]) 10/15/2006  . Essential hypertension 10/15/2006  . Allergic rhinitis 10/15/2006  . Cough variant asthma vs UACS  10/15/2006  . OVERACTIVE BLADDER 10/15/2006  . OSTEOARTHRITIS 10/15/2006  . Urinary incontinence 10/15/2006    Immunization History  Administered Date(s) Administered  . Fluad Quad(high Dose 65+) 05/15/2019, 05/01/2020  . Influenza Split 06/16/2011  . Influenza Whole 05/12/2005, 05/31/2007, 05/07/2008, 05/15/2010  . Influenza,inj,Quad PF,6+ Mos 04/03/2013, 04/18/2014, 03/29/2015, 04/30/2016, 04/23/2017, 05/19/2018  . Influenza-Unspecified  05/23/2012  . PFIZER(Purple Top)SARS-COV-2 Vaccination 09/08/2019, 09/29/2019  . Pneumococcal Conjugate-13 10/09/2014  . Pneumococcal Polysaccharide-23 11/27/2009  . Td 07/22/2005    Conditions to be addressed/monitored:  Diabetes   Patient Care Plan: CCM Pharmacy Care Plan    Problem Identified: CHL AMB "PATIENT-SPECIFIC PROBLEM"     Long-Range Goal: Disease Management   Start Date: 09/25/2020  Priority: High  Note:    Current Barriers:  . None identified  Pharmacist Clinical Goal(s):  Marland Kitchen Over the next 60 days, patient will maintain control of diabetes as evidenced by home blood glucose within goal - fasting 80-130 and 2 hours after meals < 180  through collaboration with PharmD and provider.   Interventions: . 1:1 collaboration with Tower, Wynelle Fanny, MD regarding development and update of comprehensive plan of care as evidenced by provider attestation and co-signature . Inter-disciplinary care team collaboration (see longitudinal plan of care) . Comprehensive medication review performed; medication list updated in electronic medical record  Diabetes (A1c goal <7%) -Controlled -Current medications: . No pharmacotherapy -Medications previously tried: Lantus  Contacted patient to review diabetes control with recent taper off insulin. Currently off insulin for 6 days. Patient is doing well. Reports feeling better overall. She is checking twice daily before breakfast and 2 hours after meal. -Home blood glucose log: Starting Friday - 10/19/20  Before breakfast -  113, 123, 120, 121, 133, 140  2 hours after meal - 120, 213 (dessert), 114, 150, 109 -Denies hypoglycemic/hyperglycemic symptoms Plan: She will remain off insulin. Hold off on starting metformin for now. Will review A1c with labs 10/30/20. Check fasting BG every other day until follow up. Asked her to call with any BG elevations.  Other: She also asked about dryness in nostrils from oxygen tubes. Suggested a saline nasal  spray. Applies ointment to nostril for chapped nose, Clear moisture barrier ointment.   Patient Goals/Self-Care Activities . Over the next 60 days, patient will:  - check glucose every other day before breakfast, document, and provide at future appointments  Follow up: PCP November 06, 2020; CMA call in May 2022 for DM and HF review     Medication Assistance: None required.  Patient affirms current coverage meets needs.  Patient's preferred pharmacy is:  [id number] [place] [place]Phone: [phone]: [phone] [id number] [place], [address]: [phone]: [address] [address] [facility] [place]: [phone]: [phone] [address] [place] [address] [address]one: [phone]: [phone] Plan and Follow Up Patient Decision:  Patient agrees to Care Plan and Follow-up.  MiDebbora DusPharmD Clinical Pharmacist [phone] have personally reviewed this encounter including the documentation in this note and have collaborated with the care management provider regarding care management and care coordination activities to include development and update of the comprehensive care plan. I am certifying that I agree with the content of this note and encounter as supervising physician.   MaLoura PardonD

## 2020-09-26 NOTE — Telephone Encounter (Signed)
CPE is scheduled on 11/06/20, please advise

## 2020-10-13 DIAGNOSIS — R0902 Hypoxemia: Secondary | ICD-10-CM | POA: Diagnosis not present

## 2020-10-29 ENCOUNTER — Telehealth: Payer: Self-pay | Admitting: Family Medicine

## 2020-10-29 DIAGNOSIS — E785 Hyperlipidemia, unspecified: Secondary | ICD-10-CM

## 2020-10-29 DIAGNOSIS — E538 Deficiency of other specified B group vitamins: Secondary | ICD-10-CM

## 2020-10-29 DIAGNOSIS — E1169 Type 2 diabetes mellitus with other specified complication: Secondary | ICD-10-CM

## 2020-10-29 DIAGNOSIS — E669 Obesity, unspecified: Secondary | ICD-10-CM

## 2020-10-29 DIAGNOSIS — I10 Essential (primary) hypertension: Secondary | ICD-10-CM

## 2020-10-29 NOTE — Telephone Encounter (Signed)
-----   Message from Ellamae Sia sent at 10/14/2020 12:39 PM EDT ----- Regarding: Lab orders for Wednesday, 4.6.22 Patient is scheduled for CPX labs, please order future labs, Thanks , Karna Christmas

## 2020-10-30 ENCOUNTER — Ambulatory Visit (INDEPENDENT_AMBULATORY_CARE_PROVIDER_SITE_OTHER): Payer: Medicare Other

## 2020-10-30 ENCOUNTER — Other Ambulatory Visit: Payer: Self-pay

## 2020-10-30 ENCOUNTER — Other Ambulatory Visit (INDEPENDENT_AMBULATORY_CARE_PROVIDER_SITE_OTHER): Payer: Medicare Other

## 2020-10-30 DIAGNOSIS — E538 Deficiency of other specified B group vitamins: Secondary | ICD-10-CM

## 2020-10-30 DIAGNOSIS — Z0000 Encounter for general adult medical examination without abnormal findings: Secondary | ICD-10-CM

## 2020-10-30 DIAGNOSIS — I10 Essential (primary) hypertension: Secondary | ICD-10-CM | POA: Diagnosis not present

## 2020-10-30 DIAGNOSIS — E669 Obesity, unspecified: Secondary | ICD-10-CM

## 2020-10-30 DIAGNOSIS — E785 Hyperlipidemia, unspecified: Secondary | ICD-10-CM | POA: Diagnosis not present

## 2020-10-30 DIAGNOSIS — E1169 Type 2 diabetes mellitus with other specified complication: Secondary | ICD-10-CM | POA: Diagnosis not present

## 2020-10-30 LAB — CBC WITH DIFFERENTIAL/PLATELET
Basophils Absolute: 0.1 10*3/uL (ref 0.0–0.1)
Basophils Relative: 0.7 % (ref 0.0–3.0)
Eosinophils Absolute: 0.4 10*3/uL (ref 0.0–0.7)
Eosinophils Relative: 3.5 % (ref 0.0–5.0)
HCT: 35 % — ABNORMAL LOW (ref 36.0–46.0)
Hemoglobin: 11.4 g/dL — ABNORMAL LOW (ref 12.0–15.0)
Lymphocytes Relative: 15.8 % (ref 12.0–46.0)
Lymphs Abs: 1.8 10*3/uL (ref 0.7–4.0)
MCHC: 32.6 g/dL (ref 30.0–36.0)
MCV: 97.2 fl (ref 78.0–100.0)
Monocytes Absolute: 0.6 10*3/uL (ref 0.1–1.0)
Monocytes Relative: 5.5 % (ref 3.0–12.0)
Neutro Abs: 8.6 10*3/uL — ABNORMAL HIGH (ref 1.4–7.7)
Neutrophils Relative %: 74.5 % (ref 43.0–77.0)
Platelets: 242 10*3/uL (ref 150.0–400.0)
RBC: 3.61 Mil/uL — ABNORMAL LOW (ref 3.87–5.11)
RDW: 15.2 % (ref 11.5–15.5)
WBC: 11.5 10*3/uL — ABNORMAL HIGH (ref 4.0–10.5)

## 2020-10-30 LAB — COMPREHENSIVE METABOLIC PANEL
ALT: 19 U/L (ref 0–35)
AST: 16 U/L (ref 0–37)
Albumin: 3.7 g/dL (ref 3.5–5.2)
Alkaline Phosphatase: 64 U/L (ref 39–117)
BUN: 32 mg/dL — ABNORMAL HIGH (ref 6–23)
CO2: 32 mEq/L (ref 19–32)
Calcium: 9.2 mg/dL (ref 8.4–10.5)
Chloride: 104 mEq/L (ref 96–112)
Creatinine, Ser: 1.2 mg/dL (ref 0.40–1.20)
GFR: 42.69 mL/min — ABNORMAL LOW (ref >60.00)
Glucose, Bld: 123 mg/dL — ABNORMAL HIGH (ref 70–99)
Potassium: 4.4 mEq/L (ref 3.5–5.1)
Sodium: 143 mEq/L (ref 135–145)
Total Bilirubin: 0.3 mg/dL (ref 0.2–1.2)
Total Protein: 6.5 g/dL (ref 6.0–8.3)

## 2020-10-30 LAB — LIPID PANEL
Cholesterol: 117 mg/dL (ref 0–200)
HDL: 45.8 mg/dL (ref >39.00)
NonHDL: 70.77
Total CHOL/HDL Ratio: 3
Triglycerides: 201 mg/dL — ABNORMAL HIGH (ref 0.0–149.0)
VLDL: 40.2 mg/dL — ABNORMAL HIGH (ref 0.0–40.0)

## 2020-10-30 LAB — HEMOGLOBIN A1C: Hgb A1c MFr Bld: 5.8 % (ref 4.6–6.5)

## 2020-10-30 LAB — TSH: TSH: 1.96 u[IU]/mL (ref 0.35–4.50)

## 2020-10-30 LAB — LDL CHOLESTEROL, DIRECT: Direct LDL: 53 mg/dL

## 2020-10-30 LAB — VITAMIN B12: Vitamin B-12: 343 pg/mL (ref 211–911)

## 2020-10-30 MED ORDER — CYANOCOBALAMIN 1000 MCG/ML IJ SOLN
1000.0000 ug | Freq: Once | INTRAMUSCULAR | Status: AC
Start: 2020-10-30 — End: 2020-10-30
  Administered 2020-10-30: 1000 ug via INTRAMUSCULAR

## 2020-10-30 NOTE — Patient Instructions (Addendum)
Ms. Kelli Velasquez , Thank you for taking time to come for your Medicare Wellness Visit. I appreciate your ongoing commitment to your health goals. Please review the following plan we discussed and let me know if I can assist you in the future.   Screening recommendations/referrals: Colonoscopy: no longer required  Mammogram: declined Bone Density: declined  Recommended yearly ophthalmology/optometry visit for glaucoma screening and checkup Recommended yearly dental visit for hygiene and checkup  Vaccinations: Influenza vaccine: Up to date, completed 05/01/2020, due 02/2021 Pneumococcal vaccine: Completed series Tdap vaccine: decline-insurance  Shingles vaccine: due, check with your insurance regarding coverage if interested    Covid-19:Completed series  Advanced directives: Please bring a copy of your POA (Power of Attorney) and/or Living Will to your next appointment.    Conditions/risks identified: diabetes, hyperlipidemia   Next appointment: Follow up in one year for your annual wellness visit    Preventive Care 12 Years and Older, Female Preventive care refers to lifestyle choices and visits with your health care provider that can promote health and wellness. What does preventive care include?  A yearly physical exam. This is also called an annual well check.  Dental exams once or twice a year.  Routine eye exams. Ask your health care provider how often you should have your eyes checked.  Personal lifestyle choices, including:  Daily care of your teeth and gums.  Regular physical activity.  Eating a healthy diet.  Avoiding tobacco and drug use.  Limiting alcohol use.  Practicing safe sex.  Taking low-dose aspirin every day.  Taking vitamin and mineral supplements as recommended by your health care provider. What happens during an annual well check? The services and screenings done by your health care provider during your annual well check will depend on your age,  overall health, lifestyle risk factors, and family history of disease. Counseling  Your health care provider may ask you questions about your:  Alcohol use.  Tobacco use.  Drug use.  Emotional well-being.  Home and relationship well-being.  Sexual activity.  Eating habits.  History of falls.  Memory and ability to understand (cognition).  Work and work Statistician.  Reproductive health. Screening  You may have the following tests or measurements:  Height, weight, and BMI.  Blood pressure.  Lipid and cholesterol levels. These may be checked every 5 years, or more frequently if you are over 57 years old.  Skin check.  Lung cancer screening. You may have this screening every year starting at age 31 if you have a 30-pack-year history of smoking and currently smoke or have quit within the past 15 years.  Fecal occult blood test (FOBT) of the stool. You may have this test every year starting at age 75.  Flexible sigmoidoscopy or colonoscopy. You may have a sigmoidoscopy every 5 years or a colonoscopy every 10 years starting at age 66.  Hepatitis C blood test.  Hepatitis B blood test.  Sexually transmitted disease (STD) testing.  Diabetes screening. This is done by checking your blood sugar (glucose) after you have not eaten for a while (fasting). You may have this done every 1-3 years.  Bone density scan. This is done to screen for osteoporosis. You may have this done starting at age 33.  Mammogram. This may be done every 1-2 years. Talk to your health care provider about how often you should have regular mammograms. Talk with your health care provider about your test results, treatment options, and if necessary, the need for more tests. Vaccines  Your health care provider may recommend certain vaccines, such as:  Influenza vaccine. This is recommended every year.  Tetanus, diphtheria, and acellular pertussis (Tdap, Td) vaccine. You may need a Td booster every 10  years.  Zoster vaccine. You may need this after age 65.  Pneumococcal 13-valent conjugate (PCV13) vaccine. One dose is recommended after age 14.  Pneumococcal polysaccharide (PPSV23) vaccine. One dose is recommended after age 45. Talk to your health care provider about which screenings and vaccines you need and how often you need them. This information is not intended to replace advice given to you by your health care provider. Make sure you discuss any questions you have with your health care provider. Document Released: 08/09/2015 Document Revised: 04/01/2016 Document Reviewed: 05/14/2015 Elsevier Interactive Patient Education  2017 [place] Prevention in the Home Falls can cause injuries. They can happen to people of all ages. There are many things you can do to make your home safe and to help prevent falls. What can I do on the outside of my home?  Regularly fix the edges of walkways and driveways and fix any cracks.  Remove anything that might make you trip as you walk through a door, such as a raised step or threshold.  Trim any bushes or trees on the path to your home.  Use bright outdoor lighting.  Clear any walking paths of anything that might make someone trip, such as rocks or tools.  Regularly check to see if handrails are loose or broken. Make sure that both sides of any steps have handrails.  Any raised decks and porches should have guardrails on the edges.  Have any leaves, snow, or ice cleared regularly.  Use sand or salt on walking paths during winter.  Clean up any spills in your garage right away. This includes oil or grease spills. What can I do in the bathroom?  Use night lights.  Install grab bars by the toilet and in the tub and shower. Do not use towel bars as grab bars.  Use non-skid mats or decals in the tub or shower.  If you need to sit down in the shower, use a plastic, non-slip stool.  Keep the floor dry. Clean up any water that  spills on the floor as soon as it happens.  Remove soap buildup in the tub or shower regularly.  Attach bath mats securely with double-sided non-slip rug tape.  Do not have throw rugs and other things on the floor that can make you trip. What can I do in the bedroom?  Use night lights.  Make sure that you have a light by your bed that is easy to reach.  Do not use any sheets or blankets that are too big for your bed. They should not hang down onto the floor.  Have a firm chair that has side arms. You can use this for support while you get dressed.  Do not have throw rugs and other things on the floor that can make you trip. What can I do in the kitchen?  Clean up any spills right away.  Avoid walking on wet floors.  Keep items that you use a lot in easy-to-reach places.  If you need to reach something above you, use a strong step stool that has a grab bar.  Keep electrical cords out of the way.  Do not use floor polish or wax that makes floors slippery. If you must use wax, use non-skid floor wax.  Do  not have throw rugs and other things on the floor that can make you trip. What can I do with my stairs?  Do not leave any items on the stairs.  Make sure that there are handrails on both sides of the stairs and use them. Fix handrails that are broken or loose. Make sure that handrails are as long as the stairways.  Check any carpeting to make sure that it is firmly attached to the stairs. Fix any carpet that is loose or worn.  Avoid having throw rugs at the top or bottom of the stairs. If you do have throw rugs, attach them to the floor with carpet tape.  Make sure that you have a light switch at the top of the stairs and the bottom of the stairs. If you do not have them, ask someone to add them for you. What else can I do to help prevent falls?  Wear shoes that:  Do not have high heels.  Have rubber bottoms.  Are comfortable and fit you well.  Are closed at the  toe. Do not wear sandals.  If you use a stepladder:  Make sure that it is fully opened. Do not climb a closed stepladder.  Make sure that both sides of the stepladder are locked into place.  Ask someone to hold it for you, if possible.  Clearly mark and make sure that you can see:  Any grab bars or handrails.  First and last steps.  Where the edge of each step is.  Use tools that help you move around (mobility aids) if they are needed. These include:  Canes.  Walkers.  Scooters.  Crutches.  Turn on the lights when you go into a dark area. Replace any light bulbs as soon as they burn out.  Set up your furniture so you have a clear path. Avoid moving your furniture around.  If any of your floors are uneven, fix them.  If there are any pets around you, be aware of where they are.  Review your medicines with your doctor. Some medicines can make you feel dizzy. This can increase your chance of falling. Ask your doctor what other things that you can do to help prevent falls. This information is not intended to replace advice given to you by your health care provider. Make sure you discuss any questions you have with your health care provider. Document Released: 05/09/2009 Document Revised: 12/19/2015 Document Reviewed: 08/17/2014 Elsevier Interactive Patient Education  2017 [employer].

## 2020-10-30 NOTE — Progress Notes (Signed)
Patient presented for B 12 injection given by Ceasia Elwell, CMA to left deltoid, patient voiced no concerns nor showed any signs of distress during injection.  

## 2020-10-30 NOTE — Progress Notes (Signed)
Subjective:   Kelli Velasquez is a 80 y.o. female who presents for Medicare Annual (Subsequent) preventive examination.  Review of Systems: N/A     I connected with the patient today by telephone and verified that I am speaking with the correct person using two identifiers. Location patient: home Location nurse: work Persons participating in the telephone visit: patient, nurse.   I discussed the limitations, risks, security and privacy concerns of performing an evaluation and management service by telephone and the availability of in person appointments. I also discussed with the patient that there may be a patient responsible charge related to this service. The patient expressed understanding and verbally consented to this telephonic visit.        Cardiac Risk Factors include: advanced age (>22men, >26 women);diabetes mellitus;Other (see comment), Risk factor comments: hyperlipidemia     Objective:    Today's Vitals   There is no height or weight on file to calculate BMI.  Advanced Directives 10/30/2020 07/06/2018 07/08/2017 07/07/2017 05/26/2017 05/26/2017 02/03/2017  Does Patient Have a Medical Advance Directive? Yes Yes Yes Yes Yes No Yes  Type of Paramedic of [place];Living will [place];Living will Out of facility DNR (pink MOST or yellow form) Out of facility DNR (pink MOST or yellow form) Living will - Healthcare Power of [place];Living will  Does patient want to make changes to medical advance directive? - - No - Patient declined - No - Patient declined - No - Patient declined  Copy of [place] in Chart? No - copy requested No - copy requested - - - - No - copy requested  Would patient like information on creating a medical advance directive? - - - - No - Patient declined - -  Pre-existing out of facility DNR order (yellow form or pink MOST form) - - - - - - -    Current Medications (verified) Outpatient  Encounter Medications as of 10/30/2020  Medication Sig  . acetaminophen (TYLENOL) 325 MG tablet Take 650 mg by mouth every 8 (eight) hours as needed for mild pain. Take as needed per bottle  . albuterol (PROAIR HFA) 108 (90 Base) MCG/ACT inhaler INHALE 2 PUFFS BY MOUTH EVERY 4 HOURS AS NEEDED FOR WHEEZING OR FOR SHORTNESS OF BREATH  . aspirin 81 MG tablet Take 81 mg by mouth daily.  . bisoprolol (ZEBETA) 5 MG tablet TAKE 1.5 TABLETS (7.5 MG TOTAL) BY MOUTH DAILY.  . chlorpheniramine (CHLOR-TRIMETON) 4 MG tablet Take 4 mg by mouth 2 (two) times daily as needed for allergies.  Marland Kitchen clopidogrel (PLAVIX) 75 MG tablet TAKE 1 TABLET BY MOUTH EVERY DAY  . clotrimazole-betamethasone (LOTRISONE) cream APPLY 1 APPLICATION TOPICALLY 2 (TWO) TIMES DAILY AS NEEDED. TO AFFECTED AREA/RASH  . cyanocobalamin (,VITAMIN B-12,) 1000 MCG/ML injection Inject 1,000 mcg into the muscle every 3 (three) months.  . ENTRESTO 97-103 MG TAKE 1 TABLET BY MOUTH TWICE A DAY  . EPINEPHrine (EPIPEN 2-PAK) 0.3 mg/0.3 mL IJ SOAJ injection INJECT 0.3 MLS (0.3 MG TOTAL) INTO THE MUSCLE ONCE AS NEEDED FOR ALLERGIC REACTION  . esomeprazole (NEXIUM) 40 MG capsule TAKE 1 CAPSULE BY MOUTH EVERY DAY  . ezetimibe (ZETIA) 10 MG tablet TAKE 1 TABLET BY MOUTH EVERY DAY  . famotidine (PEPCID) 20 MG tablet Take 20 mg by mouth daily.  . furosemide (LASIX) 20 MG tablet TAKE 1 TABLET BY MOUTH EVERY OTHER DAY  . methocarbamol (ROBAXIN) 500 MG tablet Take 1 tablet (500 mg total)  by mouth every 8 (eight) hours as needed for muscle spasms.  . mometasone (ELOCON) 0.1 % cream APPLY TO AFFECTED AREA EVERY DAY  . OXYGEN 24/7 3l [employer]  . REPATHA SURECLICK 229 MG/ML SOAJ INJECT 1 DOSE INTO THE SKIN EVERY 14 (FOURTEEN) DAYS.  Marland Kitchen erythromycin ophthalmic ointment USE SMALL AMOUNT TO AFFECTED EYE AS NEEDED (Patient not taking: Reported on 10/30/2020)  . [EXPIRED] cyanocobalamin ((VITAMIN B-12)) injection 1,000 mcg    No facility-administered encounter medications on  file as of 10/30/2020.    Allergies (verified) Fish allergy, Shellfish allergy, Aspirin, Atorvastatin, Crestor [rosuvastatin calcium], Penicillins, Simvastatin, and Trandolapril   History: Past Medical History:  Diagnosis Date  . Allergy    allergic rhinitis  . Asthma   . Chronic bronchitis ([place])   . Coronary artery disease    cath January 2011 with DEs LAD and RCA  . Dyspnea   . Full dentures   . Gallstones   . GERD (gastroesophageal reflux disease)   . History of echocardiogram    Echo 5/17: mod LVH, EF 50-55%, ant-septal HK, Gr 1 DD, mod LAE //  b.  Echo 7/17: mild concentric LVH, EF 45-50%, inf-lat, inf, inf-septal HK, mild LAE  . History of nuclear stress test    Myoview 7/17: EF 48%, small mild apical defect, no ischemia, low risk  . Hyperlipidemia   . Hypertension   . 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 Myopia   . 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 Nocturnal oxygen desaturation    o2 at night  . Obesity   . Osteoarthritis    knees, fingers, shoulders  . Overactive bladder   . Oxygen deficiency    uses oxygen all day  . Rash    and other non specific skin eruptions  . Retaining fluid    in ankles and feet  . Urinary incontinence   . Vertigo   . Wears glasses    Past Surgical History:  Procedure Laterality Date  . CATARACT EXTRACTION W/PHACO Right 12/16/2016   Procedure: CATARACT EXTRACTION PHACO AND INTRAOCULAR LENS PLACEMENT ([place])  right;  Surgeon: Leandrew Koyanagi, MD;  Location: [facility];  Service: Ophthalmology;  Laterality: Right;  . CATARACT EXTRACTION W/PHACO Left 02/03/2017   Procedure: CATARACT EXTRACTION PHACO AND INTRAOCULAR LENS PLACEMENT ([place])  Left  Complicated;  Surgeon: Leandrew Koyanagi, MD;  Location: [facility];  Service: Ophthalmology;  Laterality: Left;  [place] Uses oxygen   . CHOLECYSTECTOMY  92  . COLONOSCOPY    . CORONARY  ANGIOGRAPHY N/A 06/16/2017   Procedure: CORONARY ANGIOGRAPHY;  Surgeon: Larey Dresser, MD;  Location: [facility];  Service: Cardiovascular;  Laterality: N/A;  . CORONARY ANGIOPLASTY WITH STENT PLACEMENT  07/2009   stent  . CORONARY STENT INTERVENTION N/A 06/16/2017   Procedure: CORONARY STENT INTERVENTION;  Surgeon: Wellington Hampshire, MD;  Location: [facility];  Service: Cardiovascular;  Laterality: N/A;  . DILATION AND CURETTAGE OF UTERUS  1984  . INCISIONAL HERNIA REPAIR N/A 05/16/2013   Procedure: HERNIA REPAIR INFRAUMILICAL INCISIONAL;  Surgeon: Joyice Faster. Cornett, MD;  Location: [facility];  Service: General;  Laterality: N/A;  umbilical  . INSERTION OF MESH N/A 05/16/2013   Procedure: INSERTION OF MESH;  Surgeon: Joyice Faster. Cornett, MD;  Location: [facility];  Service: General;  Laterality: N/A;  umbilical  . JOINT REPLACEMENT  2007  right total knee replacement  . RIGHT/LEFT HEART CATH AND CORONARY ANGIOGRAPHY N/A 06/16/2017   Procedure: RIGHT/LEFT HEART CATH AND CORONARY ANGIOGRAPHY;  Surgeon: Larey Dresser, MD;  Location: [facility];  Service: Cardiovascular;  Laterality: N/A;  . TOTAL KNEE ARTHROPLASTY  2010   left , then vocal cord infection post op  . VIDEO BRONCHOSCOPY Bilateral 07/05/2013   Procedure: VIDEO BRONCHOSCOPY WITH FLUORO;  Surgeon: Tanda Rockers, MD;  Location: [facility];  Service: Cardiopulmonary;  Laterality: Bilateral;  . vocal cord polypectomy     Family History  Problem Relation Age of Onset  . Stroke Mother   . Diabetes Mother 47  . Stroke Father   . Heart failure Father   . Breast cancer Maternal Grandmother   . Colon cancer Neg Hx    Social History   Socioeconomic History  . Marital status: Married    Spouse name: Not on file  . Number of children: Not on file  . Years of education: Not on file  . Highest education level: Not on file  Occupational History  . Not on file  Tobacco Use   . Smoking status: Former Smoker    Packs/day: 1.00    Years: 25.00    Pack years: 25.00    Types: Cigarettes    Quit date: 07/27/1982    Years since quitting: 38.2  . Smokeless tobacco: Never Used  Vaping Use  . Vaping Use: Never used  Substance and Sexual Activity  . Alcohol use: Not Currently    Alcohol/week: 0.0 standard drinks    Comment: wine-rare  . Drug use: No  . Sexual activity: Not Currently  Other Topics Concern  . Not on file  Social History Narrative  . Not on file   Social Determinants of Health   Financial Resource Strain: Low Risk   . Difficulty of Paying Living Expenses: Not hard at all  Food Insecurity: No Food Insecurity  . Worried About Charity fundraiser in the Last Year: Never true  . Ran Out of Food in the Last Year: Never true  Transportation Needs: No Transportation Needs  . Lack of Transportation (Medical): No  . Lack of Transportation (Non-Medical): No  Physical Activity: Insufficiently Active  . Days of Exercise per Week: 3 days  . Minutes of Exercise per Session: 20 min  Stress: No Stress Concern Present  . Feeling of Stress : Not at all  Social Connections: Not on file    Tobacco Counseling Counseling given: Not Answered   Clinical Intake:  Pre-visit preparation completed: Yes  Pain : 0-10 Pain Type: Chronic pain Pain Location: Back Pain Descriptors / Indicators: Aching Pain Onset: More than a month ago Pain Frequency: Intermittent Pain Relieving Factors: tylenol, patches  Pain Relieving Factors: tylenol, patches  Nutritional Risks: None Diabetes: Yes CBG done?: No Did pt. bring in CBG monitor from home?: No  How often do you need to have someone help you when you read instructions, pamphlets, or other written materials from your doctor or pharmacy?: 1 - Never What is the last grade level you completed in school?: 12th  Diabetic: Yes Nutrition Risk Assessment:  Has the patient had any N/V/D within the last 2 months?   No  Does the patient have any non-healing wounds?  No  Has the patient had any unintentional weight loss or weight gain?  No   Diabetes:  Is the patient diabetic?  Yes  If diabetic, was a CBG obtained today?  No  telephone visit Did the patient bring in their glucometer from home?  No  telephone visit  How often do you monitor your CBG's? Only at doctors visits.   Financial Strains and Diabetes Management:  Are you having any financial strains with the device, your supplies or your medication? No .  Does the patient want to be seen by Chronic Care Management for management of their diabetes?  No  Would the patient like to be referred to a Nutritionist or for Diabetic Management?  No   Diabetic Exams:  Diabetic Eye Exam: Overdue for diabetic eye exam. Pt has been advised about the importance in completing this exam. Patient advised to call and schedule an eye exam. Scheduled for 02/24/2021 Diabetic Foot Exam: Overdue, Pt has been advised about the importance in completing this exam. Pt is scheduled for diabetic foot exam on 11/06/2020.   Interpreter Needed?: No  Information entered by :: CJohnson, LPN   Activities of Daily Living In your present state of health, do you have any difficulty performing the following activities: 10/30/2020  Hearing? N  Vision? N  Difficulty concentrating or making decisions? N  Walking or climbing stairs? N  Dressing or bathing? Y  Doing errands, shopping? Y  Preparing Food and eating ? Y  Using the Toilet? N  In the past six months, have you accidently leaked urine? Y  Comment wears pads  Do you have problems with loss of bowel control? N  Managing your Medications? N  Managing your Finances? N  Housekeeping or managing your Housekeeping? Y  Some recent data might be hidden    Patient Care Team: Tower, Wynelle Fanny, MD as PCP - General Debbora Dus, [facility] as Pharmacist (Pharmacist)  Indicate any recent Medical Services you may have received from  other than [facility] providers in the past year (date may be approximate).     Assessment:   This is a routine wellness examination for [facility].  Hearing/Vision screen  Hearing Screening   125Hz  250Hz  500Hz  1000Hz  2000Hz  3000Hz  4000Hz  6000Hz  8000Hz   Right ear:           Left ear:           Vision Screening Comments: Patient gets annual eye exams   Dietary issues and exercise activities discussed: Current Exercise Habits: Home exercise routine, Type of exercise: walking, Time (Minutes): 20, Frequency (Times/Week): 3, Weekly Exercise (Minutes/Week): 60, Intensity: Moderate, Exercise limited by: None identified  Goals    . lose weight     Starting 07/06/2018, I will continue to practice portion control in an effort to lose weight.     . Patient Stated     10/30/2020, I will continue to ride my stationary bike 3 days a week for 20 minutes.     . Pharmacy Care Plan     CARE PLAN ENTRY  Current Barriers:  . Chronic Disease Management support, education, and care coordination needs related to Hypertension and Diabetes   Hypertension BP Readings from Last 3 Encounters:  05/28/20 120/78  02/21/20 128/74  02/05/20 124/74 .  Pharmacist Clinical Goal(s): o Over the next 6 months, patient will work with PharmD and providers to maintain BP goal <140/90 mmHg . Current regimen:  o Bisoprolol 5 mg - 1 and 1/2 tablets daily o Entresto 97-103 mg - 1 tablet twice daily o Furosemide 20 mg - 1 tablet every other day . Interventions: o Comprehensive medication review . Patient self care activities - Over the next 6 months,  patient will: o Continue to check BP daily, document, and provide at future appointments o Check weight daily o Ensure daily salt intake < 2300 mg/day  Diabetes Lab Results  Component Value Date/Time   HGBA1C 5.5 07/10/2019 09:29 AM   HGBA1C 5.4 07/06/2018 12:16 PM   . Pharmacist Clinical Goal(s): o Over the next 3 months, patient will work with PharmD and providers to  maintain A1c goal <7% . Current regimen:  o Lantus - Inject 22 units daily at bedtime . Interventions: o Recommend dose reduction to 18 units and daily BG monitoring for next 7 days (approved by Dr. Glori Bickers) . Patient self care activities - Over the next 3 months, patient will: o Check blood sugar once daily, document, and provide at follow up in 1 week with assistant Ria Comment o Reduce Lantus to 18 units  o Contact provider with any episodes of hypoglycemia or hyperglycemia   Please see past updates related to this goal by clicking on the "Past Updates" button in the selected goal       Depression Screen PHQ 2/9 Scores 10/30/2020 07/17/2019 07/06/2018 01/03/2018 12/02/2016 07/13/2016  PHQ - 2 Score 0 0 0 2 2 0  PHQ- 9 Score 0 - 3 6 - -    Fall Risk Fall Risk  10/30/2020 07/17/2019 07/06/2018 12/02/2016 07/13/2016  Falls in the past year? 0 1 0 No No  Number falls in past yr: 0 0 - - -  Injury with Fall? 0 1 - - -  Risk for fall due to : Medication side effect;Impaired mobility - - - -  Follow up Falls evaluation completed;Falls prevention discussed Falls evaluation completed - - -    FALL RISK PREVENTION PERTAINING TO THE HOME:  Any stairs in or around the home? Yes  If so, are there any without handrails? No  Home free of loose throw rugs in walkways, pet beds, electrical cords, etc? Yes  Adequate lighting in your home to reduce risk of falls? Yes   ASSISTIVE DEVICES UTILIZED TO PREVENT FALLS:  Life alert? No  Use of a cane, walker or w/c? Yes  Grab bars in the bathroom? Yes  Shower chair or bench in shower? Yes  Elevated toilet seat or a handicapped toilet? Yes   TIMED UP AND GO:  Was the test performed? N/A telephone visit .    Cognitive Function: MMSE - Mini Mental State Exam 10/30/2020 07/06/2018 12/02/2016  Orientation to time 5 5 5   Orientation to Place 5 5 5   Registration 3 3 3   Attention/ Calculation 5 0 0  Recall 3 3 2   Recall-comments - - pt was unable to recall 1 of  3 words  Language- name 2 objects - 0 0  Language- repeat 1 1 1   Language- follow 3 step command - 3 2  Language- follow 3 step command-comments - - pt was unable to follow 1 step of 3 step command  Language- read & follow direction - 0 0  Write a sentence - 0 0  Copy design - 0 0  Total score - 20 18  Mini Cog  Mini-Cog screen was completed. Maximum score is 22. A value of 0 denotes this part of the MMSE was not completed or the patient failed this part of the Mini-Cog screening.       Immunizations Immunization History  Administered Date(s) Administered  . Fluad Quad(high Dose 65+) 05/15/2019, 05/01/2020  . Influenza Split 06/16/2011  . Influenza Whole 05/12/2005, 05/31/2007, 05/07/2008, 05/15/2010  .  Influenza,inj,Quad PF,6+ Mos 04/03/2013, 04/18/2014, 03/29/2015, 04/30/2016, 04/23/2017, 05/19/2018  . Influenza-Unspecified 05/23/2012  . PFIZER(Purple Top)SARS-COV-2 Vaccination 09/08/2019, 09/29/2019, 04/30/2020  . Pneumococcal Conjugate-13 10/09/2014  . Pneumococcal Polysaccharide-23 11/27/2009  . Td 07/22/2005    TDAP status: Due, Education has been provided regarding the importance of this vaccine. Advised may receive this vaccine at local pharmacy or Health Dept. Aware to provide a copy of the vaccination record if obtained from local pharmacy or Health Dept. Verbalized acceptance and understanding.  Flu Vaccine status: Up to date  Pneumococcal vaccine status: Up to date  Covid-19 vaccine status: Completed vaccines  Qualifies for Shingles Vaccine? Yes   Zostavax completed No   Shingrix Completed?: No.    Education has been provided regarding the importance of this vaccine. Patient has been advised to call insurance company to determine out of pocket expense if they have not yet received this vaccine. Advised may also receive vaccine at local pharmacy or Health Dept. Verbalized acceptance and understanding.  Screening Tests Health Maintenance  Topic Date Due  .  OPHTHALMOLOGY EXAM  09/29/2018  . FOOT EXAM  07/09/2019  . HEMOGLOBIN A1C  01/08/2020  . MAMMOGRAM  10/30/2021 (Originally 03/31/2018)  . TETANUS/TDAP  07/21/2025 (Originally 07/23/2015)  . INFLUENZA VACCINE  02/24/2021  . DEXA SCAN  Completed  . COVID-19 Vaccine  Completed  . PNA vac Low Risk Adult  Completed  . HPV VACCINES  Aged Out    Health Maintenance  Health Maintenance Due  Topic Date Due  . OPHTHALMOLOGY EXAM  09/29/2018  . FOOT EXAM  07/09/2019  . HEMOGLOBIN A1C  01/08/2020    Colorectal cancer screening: No longer required.   Mammogram status: declined  Bone Density status: declined  Lung Cancer Screening: (Low Dose CT Chest recommended if Age 59-80 years, 30 pack-year currently smoking OR have quit w/in 15years.) does not qualify.    Additional Screening:  Hepatitis C Screening: does not qualify; Completed N/A  Vision Screening: Recommended annual ophthalmology exams for early detection of glaucoma and other disorders of the eye. Is the patient up to date with their annual eye exam?  Yes  Who is the provider or what is the name of the office in which the patient attends annual eye exams? Dr. Wallace Going, [facility] If pt is not established with a provider, would they like to be referred to a provider to establish care? No .   Dental Screening: Recommended annual dental exams for proper oral hygiene  Community Resource Referral / Chronic Care Management: CRR required this visit?  No   CCM required this visit?  No      Plan:     I have personally reviewed and noted the following in the patient's chart:   . Medical and social history . Use of alcohol, tobacco or illicit drugs  . Current medications and supplements . Functional ability and status . Nutritional status . Physical activity . Advanced directives . List of other physicians . Hospitalizations, surgeries, and ER visits in previous 12 months . Vitals . Screenings to include  cognitive, depression, and falls . Referrals and appointments  In addition, I have reviewed and discussed with patient certain preventive protocols, quality metrics, and best practice recommendations. A written personalized care plan for preventive services as well as general preventive health recommendations were provided to patient.   Due to this being a telephonic visit, the after visit summary with patients personalized plan was offered to patient via office or my-chart. Patient preferred to  pick up at office at next visit or via mychart.   Andrez Grime, LPN   [id number]

## 2020-10-30 NOTE — Progress Notes (Signed)
PCP notes:  Health Maintenance: Foot exam- due Eye exam- scheduled 02/24/2021 Dexa-declined Mammogram- declined   Abnormal Screenings: none   Patient concerns: Cold/hot intolerance Tremor in hands Wants to take B12 pills in stead of injections Nose and ears irritated from oxygen   Nurse concerns: none   Next PCP appt.: 11/06/2020 @ 11 am

## 2020-11-06 ENCOUNTER — Encounter: Payer: Self-pay | Admitting: Family Medicine

## 2020-11-06 ENCOUNTER — Other Ambulatory Visit: Payer: Self-pay

## 2020-11-06 ENCOUNTER — Ambulatory Visit (INDEPENDENT_AMBULATORY_CARE_PROVIDER_SITE_OTHER): Payer: Medicare Other | Admitting: Family Medicine

## 2020-11-06 VITALS — BP 126/68 | HR 72 | Temp 96.9°F | Wt 207.0 lb

## 2020-11-06 DIAGNOSIS — E669 Obesity, unspecified: Secondary | ICD-10-CM

## 2020-11-06 DIAGNOSIS — M8589 Other specified disorders of bone density and structure, multiple sites: Secondary | ICD-10-CM | POA: Diagnosis not present

## 2020-11-06 DIAGNOSIS — R0600 Dyspnea, unspecified: Secondary | ICD-10-CM

## 2020-11-06 DIAGNOSIS — J9611 Chronic respiratory failure with hypoxia: Secondary | ICD-10-CM

## 2020-11-06 DIAGNOSIS — R7303 Prediabetes: Secondary | ICD-10-CM | POA: Diagnosis not present

## 2020-11-06 DIAGNOSIS — Z0000 Encounter for general adult medical examination without abnormal findings: Secondary | ICD-10-CM

## 2020-11-06 DIAGNOSIS — I10 Essential (primary) hypertension: Secondary | ICD-10-CM | POA: Diagnosis not present

## 2020-11-06 DIAGNOSIS — E785 Hyperlipidemia, unspecified: Secondary | ICD-10-CM

## 2020-11-06 DIAGNOSIS — E6609 Other obesity due to excess calories: Secondary | ICD-10-CM

## 2020-11-06 DIAGNOSIS — E538 Deficiency of other specified B group vitamins: Secondary | ICD-10-CM

## 2020-11-06 DIAGNOSIS — E66811 Obesity, class 1: Secondary | ICD-10-CM

## 2020-11-06 DIAGNOSIS — R0609 Other forms of dyspnea: Secondary | ICD-10-CM

## 2020-11-06 DIAGNOSIS — E1169 Type 2 diabetes mellitus with other specified complication: Secondary | ICD-10-CM | POA: Diagnosis not present

## 2020-11-06 DIAGNOSIS — Z6833 Body mass index (BMI) 33.0-33.9, adult: Secondary | ICD-10-CM

## 2020-11-06 DIAGNOSIS — L309 Dermatitis, unspecified: Secondary | ICD-10-CM | POA: Diagnosis not present

## 2020-11-06 DIAGNOSIS — N289 Disorder of kidney and ureter, unspecified: Secondary | ICD-10-CM | POA: Diagnosis not present

## 2020-11-06 DIAGNOSIS — J9612 Chronic respiratory failure with hypercapnia: Secondary | ICD-10-CM

## 2020-11-06 DIAGNOSIS — H04123 Dry eye syndrome of bilateral lacrimal glands: Secondary | ICD-10-CM | POA: Diagnosis not present

## 2020-11-06 NOTE — Assessment & Plan Note (Signed)
Discussed how this problem influences overall health and the risks it imposes  Reviewed plan for weight loss with lower calorie diet (via better food choices and also portion control or program like weight watchers) and exercise building up to or more than 30 minutes 5 days per week including some aerobic activity   Commended on more exercise (pedaling)

## 2020-11-06 NOTE — Assessment & Plan Note (Signed)
bp in fair control at this time  BP Readings from Last 1 Encounters:  11/06/20 126/68   No changes needed Most recent labs reviewed  Disc lifstyle change with low sodium diet and exercise  Taking bisoprolol  7.5 mg daily entresto 97-103 mg bid Lasix 20 mg every other day

## 2020-11-06 NOTE — Assessment & Plan Note (Signed)
Labs are fairly stable and multifactorial Has fluid limitation from cardiology  No nsaids  Mild anemia of chronic dz is stable as well  No recent uti

## 2020-11-06 NOTE — Assessment & Plan Note (Signed)
Continues 02 and pulmonary care

## 2020-11-06 NOTE — Assessment & Plan Note (Signed)
Lab Results  Component Value Date   VITAMINB12 343 10/30/2020   Continue injections every 3 mo  Also taking ppi

## 2020-11-06 NOTE — Progress Notes (Signed)
Subjective:    Patient ID: Kelli Velasquez, female    DOB: 06/30/1941, 80 y.o.   MRN: [id number]  This visit occurred during the SARS-CoV-2 public health emergency.  Safety protocols were in place, including screening questions prior to the visit, additional usage of staff PPE, and extensive cleaning of exam room while observing appropriate contact time as indicated for disinfecting solutions.    HPI Here for health maintenance exam and to review chronic medical problems    Wt Readings from Last 3 Encounters:  11/06/20 207 lb (93.9 kg)  09/20/20 204 lb 11.2 oz (92.9 kg)  05/28/20 212 lb (96.2 kg)   33.92 kg/m  She weighs every day   Had amw on 10/30/20  Mammogram 9/18- declines mammogram  Self breast exam -no lumps  Declines breast exam  Colonoscopy 1/13 Declines any colon cancer screening   Last eye exam - needs an appt  Sees Dr Wallace Going (has cancelled every time)    covid immunized   Declines shingrix vaccine    dexa 9/18 -osteopenia- declines (too difficult to get out) Falls -none new Fractures-none new Supplements -none Exercise - using a pedaler machine now daily and getting stronger   HTN-in setting of CHF and CAD bp is stable today  No cp or palpitations or headaches or edema  No side effects to medicines  BP Readings from Last 3 Encounters:  11/06/20 126/68  09/20/20 117/68  05/28/20 120/78     Taking bisoprolol 7.5 mg daily  entresto 97-103 mg bid Lasix 20 mg every other day   Pulse Readings from Last 3 Encounters:  11/06/20 72  09/20/20 67  05/28/20 68   Lab Results  Component Value Date   CREATININE 1.20 10/30/2020   BUN 32 (H) 10/30/2020   NA 143 10/30/2020   K 4.4 10/30/2020   CL 104 10/30/2020   CO2 32 10/30/2020  she is on water restriction at 2L per day  Some ginger ale and cranberry juice    Takes nexium for GERD Lab Results  Component Value Date   VITAMINB12 343 10/30/2020  B12 shot every 3 mo    DM2 Lab Results   Component Value Date   HGBA1C 5.8 10/30/2020  off prednisone - still better  Stable  No glycemic  Hyperlipidemia Lab Results  Component Value Date   CHOL 117 10/30/2020   CHOL 118 05/28/2020   CHOL 116 07/10/2019   Lab Results  Component Value Date   HDL 45.80 10/30/2020   HDL 43 05/28/2020   HDL 50.70 07/10/2019   Lab Results  Component Value Date   LDLCALC 38 05/28/2020   LDLCALC 28 07/10/2019   LDLCALC 32 06/27/2019   Lab Results  Component Value Date   TRIG 201.0 (H) 10/30/2020   TRIG 187 (H) 05/28/2020   TRIG 190.0 (H) 07/10/2019   Lab Results  Component Value Date   CHOLHDL 3 10/30/2020   CHOLHDL 2.7 05/28/2020   CHOLHDL 2 07/10/2019   Lab Results  Component Value Date   LDLDIRECT 53.0 10/30/2020   LDLDIRECT 121.0 07/06/2018   LDLDIRECT 124.8 09/01/2007   zetia 10 mg daily  repatha -doing well  Sees cardiology for this   No big changes in cbc   Lab Results  Component Value Date   WBC 11.5 (H) 10/30/2020   HGB 11.4 (L) 10/30/2020   HCT 35.0 (L) 10/30/2020   MCV 97.2 10/30/2020   PLT 242.0 10/30/2020   Lab Results  Component Value Date  TSH 1.96 10/30/2020   she does get hot and cold  Needs handicapped placard filled out   Wants to know if she can have an occ glass of wine with dinner (not often)  She has a lot of dermatitis behind ears from her 02 tubing Uses mometasone  Uses non allergenic tubing   Has problems in her nose (it runs and gets scabby) -keeps 02 nasal cannula  She has used saline  Has used a barrier cream   Patient Active Problem List   Diagnosis Date Noted  . Class 1 obesity with serious comorbidity and body mass index (BMI) of 33.0 to 33.9 in adult 11/06/2020  . Renal insufficiency 07/17/2019  . History of arm fracture 03/21/2019  . Low back pain 08/23/2018  . Eczema 07/08/2018  . Hoarseness of voice 01/03/2018  . Coronary artery disease 07/07/2017  . Chronic systolic heart failure ([place]) 07/07/2017  . Abnormal  urinalysis 07/07/2017  . History of DVT (deep vein thrombosis) 07/07/2017  . Prediabetes 07/07/2017  . 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 Hypoxemia   . Encounter for screening mammogram for breast cancer 02/02/2017  . Chronic combined systolic and diastolic CHF, NYHA class 2 ([place]) 02/02/2017  . Depressed mood 12/02/2016  . Routine general medical examination at a health care facility 12/30/2015  . Supraumbilical hernia 29/92/4268  . Lump in the abdomen 04/10/2015  . GERD (gastroesophageal reflux disease) 10/10/2014  . B12 deficiency 10/09/2014  . Cataracts, bilateral 10/09/2014  . Dry eyes 10/09/2014  . Fatigue 05/08/2014  . Pedal edema 05/08/2014  . Osteopenia 11/28/2013  . Estrogen deficiency 11/02/2013  . Dyspnea on exertion 06/27/2013  . Hernia, incisional 04/03/2013  . 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 Cough 12/18/2012  . Chronic respiratory failure with hypoxia and hypercapnia ([place]) 10/26/2012  . Special screening for malignant neoplasms, colon 06/16/2011  . CAD, NATIVE VESSEL 08/19/2009  . 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 MYOPIA 03/03/2007  . Hyperlipidemia associated with type 2 diabetes mellitus ([place]) 10/15/2006  . Essential hypertension 10/15/2006  . Allergic rhinitis 10/15/2006  . Cough variant asthma vs UACS  10/15/2006  . OVERACTIVE BLADDER 10/15/2006  . OSTEOARTHRITIS 10/15/2006  . Urinary incontinence 10/15/2006   Past Medical History:  Diagnosis Date  . Allergy    allergic rhinitis  . Asthma   . Chronic bronchitis ([place])   . Coronary artery disease    cath January 2011 with DEs LAD and RCA  . Dyspnea   . Full dentures   . Gallstones   . GERD (gastroesophageal reflux disease)   . History of echocardiogram    Echo 5/17: mod LVH, EF 50-55%, ant-septal HK, Gr 1 DD, mod LAE //  b.  Echo 7/17: mild concentric LVH, EF 45-50%, inf-lat, inf, inf-septal HK, mild LAE  . History of  nuclear stress test    Myoview 7/17: EF 48%, small mild apical defect, no ischemia, low risk  . Hyperlipidemia   . Hypertension   . Myocardial infarction ([place])    subendocardial, initial episode, 2010 two stents placed  . Myopia   . 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 Nocturnal oxygen desaturation    o2 at night  . Obesity   . Osteoarthritis    knees, fingers, shoulders  . Overactive bladder   . Oxygen deficiency    uses oxygen all day  . Rash    and other non specific skin  eruptions  . Retaining fluid    in ankles and feet  . Urinary incontinence   . Vertigo   . Wears glasses    Past Surgical History:  Procedure Laterality Date  . CATARACT EXTRACTION W/PHACO Right 12/16/2016   Procedure: CATARACT EXTRACTION PHACO AND INTRAOCULAR LENS PLACEMENT ([place])  right;  Surgeon: Leandrew Koyanagi, MD;  Location: [facility];  Service: Ophthalmology;  Laterality: Right;  . CATARACT EXTRACTION W/PHACO Left 02/03/2017   Procedure: CATARACT EXTRACTION PHACO AND INTRAOCULAR LENS PLACEMENT ([place])  Left  Complicated;  Surgeon: Leandrew Koyanagi, MD;  Location: [facility];  Service: Ophthalmology;  Laterality: Left;  M[place]ugin Uses oxygen   . CHOLECYSTECTOMY  92  . COLONOSCOPY    . CORONARY ANGIOGRAPHY N/A 06/16/2017   Procedure: CORONARY ANGIOGRAPHY;  Surgeon: Larey Dresser, MD;  Location: [facility];  Service: Cardiovascular;  Laterality: N/A;  . CORONARY ANGIOPLASTY WITH STENT PLACEMENT  07/2009   stent  . CORONARY STENT INTERVENTION N/A 06/16/2017   Procedure: CORONARY STENT INTERVENTION;  Surgeon: Wellington Hampshire, MD;  Location: [facility];  Service: Cardiovascular;  Laterality: N/A;  . DILATION AND CURETTAGE OF UTERUS  1984  . INCISIONAL HERNIA REPAIR N/A 05/16/2013   Procedure: HERNIA REPAIR INFRAUMILICAL INCISIONAL;  Surgeon: Joyice Faster. Cornett, MD;  Location: [facility];  Service: General;  Laterality:  N/A;  umbilical  . INSERTION OF MESH N/A 05/16/2013   Procedure: INSERTION OF MESH;  Surgeon: Joyice Faster. Cornett, MD;  Location: [facility];  Service: General;  Laterality: N/A;  umbilical  . JOINT REPLACEMENT  2007   right total knee replacement  . RIGHT/LEFT HEART CATH AND CORONARY ANGIOGRAPHY N/A 06/16/2017   Procedure: RIGHT/LEFT HEART CATH AND CORONARY ANGIOGRAPHY;  Surgeon: Larey Dresser, MD;  Location: [facility];  Service: Cardiovascular;  Laterality: N/A;  . TOTAL KNEE ARTHROPLASTY  2010   left , then vocal cord infection post op  . VIDEO BRONCHOSCOPY Bilateral 07/05/2013   Procedure: VIDEO BRONCHOSCOPY WITH FLUORO;  Surgeon: Tanda Rockers, MD;  Location: [facility];  Service: Cardiopulmonary;  Laterality: Bilateral;  . vocal cord polypectomy     Social History   Tobacco Use  . Smoking status: Former Smoker    Packs/day: 1.00    Years: 25.00    Pack years: 25.00    Types: Cigarettes    Quit date: 07/27/1982    Years since quitting: 38.3  . Smokeless tobacco: Never Used  Vaping Use  . Vaping Use: Never used  Substance Use Topics  . Alcohol use: Not Currently    Alcohol/week: 0.0 standard drinks    Comment: wine-rare  . Drug use: No   Family History  Problem Relation Age of Onset  . Stroke Mother   . Diabetes Mother 72  . Stroke Father   . Heart failure Father   . Breast cancer Maternal Grandmother   . Colon cancer Neg Hx    Allergies  Allergen Reactions  . Fish Allergy Anaphylaxis  . Shellfish Allergy Anaphylaxis  . Aspirin     REACTION: nausea and vomiting High doses  . Atorvastatin     REACTION: leg pain  . Crestor [Rosuvastatin Calcium] Other (See Comments)    Muscle pain - allergy/intolerance  . Penicillins     Due to mold allergy per pt  . Simvastatin     REACTION: muscle pain  . Trandolapril     REACTION: leg pain   Current  Outpatient Medications on File Prior to Visit  Medication Sig Dispense Refill  . acetaminophen  (TYLENOL) 325 MG tablet Take 650 mg by mouth every 8 (eight) hours as needed for mild pain. Take as needed per bottle    . albuterol (PROAIR HFA) 108 (90 Base) MCG/ACT inhaler INHALE 2 PUFFS BY MOUTH EVERY 4 HOURS AS NEEDED FOR WHEEZING OR FOR SHORTNESS OF BREATH 8 Inhaler 5  . aspirin 81 MG tablet Take 81 mg by mouth daily.    . bisoprolol (ZEBETA) 5 MG tablet TAKE 1.5 TABLETS (7.5 MG TOTAL) BY MOUTH DAILY. 135 tablet 2  . chlorpheniramine (CHLOR-TRIMETON) 4 MG tablet Take 4 mg by mouth 2 (two) times daily as needed for allergies.    Marland Kitchen clopidogrel (PLAVIX) 75 MG tablet TAKE 1 TABLET BY MOUTH EVERY DAY 90 tablet 1  . clotrimazole-betamethasone (LOTRISONE) cream APPLY 1 APPLICATION TOPICALLY 2 (TWO) TIMES DAILY AS NEEDED. TO AFFECTED AREA/RASH 30 g 1  . cyanocobalamin (,VITAMIN B-12,) 1000 MCG/ML injection Inject 1,000 mcg into the muscle every 3 (three) months.    . ENTRESTO 97-103 MG TAKE 1 TABLET BY MOUTH TWICE A DAY 60 tablet 6  . EPINEPHrine (EPIPEN 2-PAK) 0.3 mg/0.3 mL IJ SOAJ injection INJECT 0.3 MLS (0.3 MG TOTAL) INTO THE MUSCLE ONCE AS NEEDED FOR ALLERGIC REACTION 2 Device 1  . erythromycin ophthalmic ointment     . esomeprazole (NEXIUM) 40 MG capsule TAKE 1 CAPSULE BY MOUTH EVERY DAY 90 capsule 0  . ezetimibe (ZETIA) 10 MG tablet TAKE 1 TABLET BY MOUTH EVERY DAY 90 tablet 1  . famotidine (PEPCID) 20 MG tablet Take 20 mg by mouth daily.    . furosemide (LASIX) 20 MG tablet TAKE 1 TABLET BY MOUTH EVERY OTHER DAY 45 tablet 1  . methocarbamol (ROBAXIN) 500 MG tablet Take 1 tablet (500 mg total) by mouth every 8 (eight) hours as needed for muscle spasms. 90 tablet 0  . mometasone (ELOCON) 0.1 % cream APPLY TO AFFECTED AREA EVERY DAY 15 g 3  . OXYGEN 24/7 3l [employer]    . REPATHA SURECLICK 759 MG/ML SOAJ INJECT 1 DOSE INTO THE SKIN EVERY 14 (FOURTEEN) DAYS. 2 mL 11   No current facility-administered medications on file prior to visit.    Review of Systems  Constitutional: Negative for  activity change, appetite change, fatigue, fever and unexpected weight change.  HENT: Positive for rhinorrhea. Negative for congestion, ear pain, sinus pressure and sore throat.        Sores in nostrils  Eyes: Negative for pain, redness and visual disturbance.  Respiratory: Positive for shortness of breath. Negative for cough and wheezing.   Cardiovascular: Negative for chest pain and palpitations.  Gastrointestinal: Negative for abdominal pain, blood in stool, constipation and diarrhea.  Endocrine: Negative for polydipsia and polyuria.  Genitourinary: Negative for dysuria, frequency and urgency.  Musculoskeletal: Negative for arthralgias, back pain and myalgias.  Skin: Positive for rash. Negative for pallor.  Allergic/Immunologic: Negative for environmental allergies.  Neurological: Negative for dizziness, syncope and headaches.  Hematological: Negative for adenopathy. Does not bruise/bleed easily.  Psychiatric/Behavioral: Negative for decreased concentration and dysphoric mood. The patient is not nervous/anxious.        Objective:   Physical Exam Constitutional:      General: She is not in acute distress.    Appearance: Normal appearance. She is well-developed. She is obese. She is not ill-appearing or diaphoretic.  HENT:     Head: Normocephalic and atraumatic.  Right Ear: Tympanic membrane, ear canal and external ear normal.     Left Ear: Tympanic membrane, ear canal and external ear normal.     Nose: Nose normal. No congestion.     Comments: Few scabs in nostrils bilaterally  No signs of infection     Mouth/Throat:     Mouth: Mucous membranes are moist.     Pharynx: Oropharynx is clear. No posterior oropharyngeal erythema.  Eyes:     General: No scleral icterus.    Extraocular Movements: Extraocular movements intact.     Conjunctiva/sclera: Conjunctivae normal.     Pupils: Pupils are equal, round, and reactive to light.     Comments: Left eye - erythema and drooping lower  lid-more than baseline  Neck:     Thyroid: No thyromegaly.     Vascular: No carotid bruit or JVD.  Cardiovascular:     Rate and Rhythm: Normal rate and regular rhythm.     Pulses: Normal pulses.     Heart sounds: Normal heart sounds. No gallop.   Pulmonary:     Effort: Pulmonary effort is normal. No respiratory distress.     Breath sounds: Normal breath sounds. No wheezing.     Comments: Diffusely distant bs No crackles Chest:     Chest wall: No tenderness.  Abdominal:     General: Bowel sounds are normal. There is no distension or abdominal bruit.     Palpations: Abdomen is soft. There is no mass.     Tenderness: There is no abdominal tenderness. There is no guarding or rebound.     Hernia: No hernia is present.     Comments: Exam done sitting  Genitourinary:    Comments: Declines breast exam Musculoskeletal:        General: No tenderness. Normal range of motion.     Cervical back: Normal range of motion and neck supple. No rigidity. No muscular tenderness.     Right lower leg: No edema.     Left lower leg: No edema.     Comments: Mild kyphosis   Lymphadenopathy:     Cervical: No cervical adenopathy.  Skin:    General: Skin is warm and dry.     Coloration: Skin is not pale.     Findings: No erythema or rash.     Comments: Dry skin behind both ears with some scale but no erythema   Neurological:     Mental Status: She is alert. Mental status is at baseline.     Cranial Nerves: No cranial nerve deficit.     Motor: No abnormal muscle tone.     Coordination: Coordination normal.     Gait: Gait normal.     Deep Tendon Reflexes: Reflexes are normal and symmetric. Reflexes normal.  Psychiatric:        Mood and Affect: Mood normal.        Cognition and Memory: Cognition and memory normal.           Assessment & Plan:   Problem List Items Addressed This Visit      Cardiovascular and Mediastinum   Essential hypertension    bp in fair control at this time  BP  Readings from Last 1 Encounters:  11/06/20 126/68   No changes needed Most recent labs reviewed  Disc lifstyle change with low sodium diet and exercise  Taking bisoprolol  7.5 mg daily entresto 97-103 mg bid Lasix 20 mg every other day  Respiratory   Chronic respiratory failure with hypoxia and hypercapnia (HCC)    Continues 02 and pulmonary care          Endocrine   Hyperlipidemia associated with type 2 diabetes mellitus (HCC)     Musculoskeletal and Integument   Osteopenia    Declines dexa Declines further treatment  Discussed fall prevention  Getting more exercise  Enc her to start vitamin D supplementation        Eczema    Dermatitis behind ears -likely due to 02 tubing  Enc pt to discuss with pulmonologist Mometasone prn  Enc to try not to scratch Watch for redness or other signs of infection         Genitourinary   Renal insufficiency    Labs are fairly stable and multifactorial Has fluid limitation from cardiology  No nsaids  Mild anemia of chronic dz is stable as well  No recent uti        Other   Dyspnea on exertion    Chronic  On 02 Sees pulmonary  Handicapped placard form done      B12 deficiency    Lab Results  Component Value Date   VITAMINB12 343 10/30/2020   Continue injections every 3 mo  Also taking ppi      Dry eyes    Chronic along with drooping lower lid L eye  Ref to ophty for f/u      Routine general medical examination at a health care facility - Primary    Reviewed health habits including diet and exercise and skin cancer prevention Reviewed appropriate screening tests for age  Also reviewed health mt list, fam hx and immunization status , as well as social and family history   See HPI Labs reviewed  Declines breast or colon cancer screening  Ref done for eye exam  covid immunized Declines shingrix Declines dexa or OP treatment  Adv to start vit D and continue exercise  No falls or fractures        Prediabetes    Lab Results  Component Value Date   HGBA1C 5.8 10/30/2020   In pre diabetic range now that she is off of prednisone Doing better  Ref done for DM eye exam  Encouraged low glycemic diet  Taking repatha and on arb      Class 1 obesity with serious comorbidity and body mass index (BMI) of 33.0 to 33.9 in adult    Discussed how this problem influences overall health and the risks it imposes  Reviewed plan for weight loss with lower calorie diet (via better food choices and also portion control or program like weight watchers) and exercise building up to or more than 30 minutes 5 days per week including some aerobic activity   Commended on more exercise (pedaling)

## 2020-11-06 NOTE — Assessment & Plan Note (Signed)
Chronic along with drooping lower lid L eye  Ref to ophty for f/u

## 2020-11-06 NOTE — Assessment & Plan Note (Signed)
Lab Results  Component Value Date   HGBA1C 5.8 10/30/2020   In pre diabetic range now that she is off of prednisone Doing better  Ref done for DM eye exam  Encouraged low glycemic diet  Taking repatha and on arb

## 2020-11-06 NOTE — Assessment & Plan Note (Signed)
Chronic  On 02 Sees pulmonary  Handicapped placard form done

## 2020-11-06 NOTE — Assessment & Plan Note (Signed)
Dermatitis behind ears -likely due to 02 tubing  Enc pt to discuss with pulmonologist Mometasone prn  Enc to try not to scratch Watch for redness or other signs of infection

## 2020-11-06 NOTE — Assessment & Plan Note (Signed)
Reviewed health habits including diet and exercise and skin cancer prevention Reviewed appropriate screening tests for age  Also reviewed health mt list, fam hx and immunization status , as well as social and family history   See HPI Labs reviewed  Declines breast or colon cancer screening  Ref done for eye exam  covid immunized Declines shingrix Declines dexa or OP treatment  Adv to start vit D and continue exercise  No falls or fractures

## 2020-11-06 NOTE — Assessment & Plan Note (Signed)
Declines dexa Declines further treatment  Discussed fall prevention  Getting more exercise  Enc her to start vitamin D supplementation

## 2020-11-06 NOTE — Patient Instructions (Addendum)
Get vitamin D3 over the counter and take 2000 iu daily  That is important for bone health   I placed an order for ophthalmology visit-the office will call you   Try a tiny bit of antibiotic ointment like neo sporin/poly sporin/ triple In nostrils at bedtime  Ask your pulmonary doctor as well what they recommend   If you change your mind about a mammogram or bone density test let us know

## 2020-11-11 ENCOUNTER — Telehealth: Payer: Self-pay

## 2020-11-11 NOTE — Telephone Encounter (Signed)
Patient states during her last office visit with Dr Glori Bickers on 11/06/20 it was discussed stopping Lantus but patient did not verify if she still needs to check her sugar like she has been doing. She is aware that Dr Glori Bickers is out of the office this week and if another provider is not able to answer that question she will hear back next week. Patient will be checking her sugar still for now until she hears back.

## 2020-11-11 NOTE — Telephone Encounter (Signed)
Keep checking glucose for 2 weeks or so just to make sure it it not very high w/o the insulin

## 2020-11-12 DIAGNOSIS — H02054 Trichiasis without entropian left upper eyelid: Secondary | ICD-10-CM | POA: Diagnosis not present

## 2020-11-12 DIAGNOSIS — E119 Type 2 diabetes mellitus without complications: Secondary | ICD-10-CM | POA: Diagnosis not present

## 2020-11-12 LAB — HM DIABETES EYE EXAM

## 2020-11-12 NOTE — Telephone Encounter (Signed)
Pt notified of Dr. Marliss Coots comments and will update [place] in 2 weeks

## 2020-11-12 NOTE — Telephone Encounter (Signed)
Left VM requesting pt to call the office back 

## 2020-11-13 DIAGNOSIS — R0902 Hypoxemia: Secondary | ICD-10-CM | POA: Diagnosis not present

## 2020-12-02 ENCOUNTER — Telehealth: Payer: Self-pay

## 2020-12-02 NOTE — Chronic Care Management (AMB) (Addendum)
Chronic Care Management Pharmacy Assistant   Name: Kelli Velasquez  MRN: [id number] DOB: 04-24-1941  Reason for Encounter: Disease State CHF and Diabetes   Recent office visits:  11/06/20- Dr. Glori Bickers- PCP- Annual Exam. Referred to ophthalmology. Advised to get vitamin D3 over the counter and take 2000 IU daily. Try a tiny bit of antibiotic ointment like neo sporin/poly sporin/ triple in nostrils at bedtime. Ask your pulmonary doctor as well what they recommend   Recent consult visits:  None since last [facility] visits:  None in previous 6 months  Medications: Outpatient Encounter Medications as of 12/02/2020  Medication Sig   acetaminophen (TYLENOL) 325 MG tablet Take 650 mg by mouth every 8 (eight) hours as needed for mild pain. Take as needed per bottle   albuterol (PROAIR HFA) 108 (90 Base) MCG/ACT inhaler INHALE 2 PUFFS BY MOUTH EVERY 4 HOURS AS NEEDED FOR WHEEZING OR FOR SHORTNESS OF BREATH   aspirin 81 MG tablet Take 81 mg by mouth daily.   bisoprolol (ZEBETA) 5 MG tablet TAKE 1.5 TABLETS (7.5 MG TOTAL) BY MOUTH DAILY.   chlorpheniramine (CHLOR-TRIMETON) 4 MG tablet Take 4 mg by mouth 2 (two) times daily as needed for allergies.   clopidogrel (PLAVIX) 75 MG tablet TAKE 1 TABLET BY MOUTH EVERY DAY   clotrimazole-betamethasone (LOTRISONE) cream APPLY 1 APPLICATION TOPICALLY 2 (TWO) TIMES DAILY AS NEEDED. TO AFFECTED AREA/RASH   cyanocobalamin (,VITAMIN B-12,) 1000 MCG/ML injection Inject 1,000 mcg into the muscle every 3 (three) months.   ENTRESTO 97-103 MG TAKE 1 TABLET BY MOUTH TWICE A DAY   EPINEPHrine (EPIPEN 2-PAK) 0.3 mg/0.3 mL IJ SOAJ injection INJECT 0.3 MLS (0.3 MG TOTAL) INTO THE MUSCLE ONCE AS NEEDED FOR ALLERGIC REACTION   erythromycin ophthalmic ointment    esomeprazole (NEXIUM) 40 MG capsule TAKE 1 CAPSULE BY MOUTH EVERY DAY   ezetimibe (ZETIA) 10 MG tablet TAKE 1 TABLET BY MOUTH EVERY DAY   famotidine (PEPCID) 20 MG tablet Take 20 mg by mouth daily.    furosemide (LASIX) 20 MG tablet TAKE 1 TABLET BY MOUTH EVERY OTHER DAY   methocarbamol (ROBAXIN) 500 MG tablet Take 1 tablet (500 mg total) by mouth every 8 (eight) hours as needed for muscle spasms.   mometasone (ELOCON) 0.1 % cream APPLY TO AFFECTED AREA EVERY DAY   OXYGEN 24/7 3l [employer]   REPATHA SURECLICK 235 MG/ML SOAJ INJECT 1 DOSE INTO THE SKIN EVERY 14 (FOURTEEN) DAYS.   No facility-administered encounter medications on file as of 12/02/2020.     Recent Relevant Labs: Lab Results  Component Value Date/Time   HGBA1C 5.8 10/30/2020 09:12 AM   HGBA1C 5.5 07/10/2019 09:29 AM    Kidney Function Lab Results  Component Value Date/Time   CREATININE 1.20 10/30/2020 09:12 AM   CREATININE 1.09 (H) 05/28/2020 03:44 PM   CREATININE 1.01 (H) 12/16/2015 11:42 AM   CREATININE 0.79 11/18/2015 11:06 AM   GFR 42.69 (L) 10/30/2020 09:12 AM   GFRNONAA 51 (L) 05/28/2020 03:44 PM   GFRAA 41 (L) 01/26/2020 11:47 AM   Diabetes  Current antihyperglycemic regimen:  No pharmacotherapy.   Patient verbally confirms the above. Yes - off Insulin since 09/19/20.  What recent interventions/DTPs have been made to improve glycemic control:  Discontinued DM medications in February.   Have there been any recent hospitalizations or ED visits since last visit with CPP? No  Patient denies hypoglycemic symptoms, including Pale, Sweaty, Shaky, Hungry, Nervous/irritable and Vision changes  Patient  denies hyperglycemic symptoms, including blurry vision, excessive thirst, fatigue, polyuria and weakness  How often are you checking your blood sugar? Once every other day  What are your blood sugars ranging?  Fasting: 100-153 Before meals: N/A After meals: N/A Bedtime: N/A  On insulin? No  During the week, how often does your blood glucose drop below 70? Never  Are you checking your feet daily/regularly? 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   Adherence Review: Is the patient currently on  a STATIN medication? No Is the patient currently on ACE/ARB medication? Yes Does the patient have >5 day gap between last estimated fill dates? No   Heart Failure  Current heart failure regimen: Entresto 97-103 mg 1 tablet twice daily Bisoprolol 5 mg- 1.5 tablet daily Furosemide 20 mg- 1 tablet every other day  Are you taking a diuretic? Yes How often: every other day  Do you weigh yourself daily or regularly? Yes states she is usually 89.   Have any of the following symptoms worsened or changed from your baseline?   denies worsening of SOB, increased swelling, or abnormal weight gain of more than 3 pounds in one day or 5 pounds in one week  Do you see a cardiologist? Yes             Name: Dr. Loralie Champagne Last visit? 05/28/20 Upcoming visit? 12/13/20  How often are you checking your Blood Pressure? 3-5x per week  she alternates when she checks her blood pressure sometimes before taking her medication and sometimes after she takes her medication.   DATE:             BP               PULSE -  124/63  - -  130/65  - -  127/59  - -  133/62  -   Wrist or arm cuff: Wrist Caffeine intake: Tries to avoid caffeine.  Salt intake: Limits salt. Does not have a salt shaker.  OTC medications including pseudoephedrine or NSAIDs? No Exercise habits: Tries to get up and walk around the house. She has pedals that she uses - she states she is up to 25 mins a day.    Star Rating Drugs:  Medication:  Last Fill: [facility] Supply Entresto 97-103 mg 11/22/20 [place], CPP notified  Margaretmary Dys, [place] Assistant [phone]  I have reviewed the care management and care coordination activities outlined in this encounter and I am certifying that I agree with the content of this note. No further action required.  Debbora Dus, PharmD Clinical Pharmacist [facility] [phone]

## 2020-12-05 ENCOUNTER — Encounter: Payer: Self-pay | Admitting: Family Medicine

## 2020-12-05 DIAGNOSIS — H16213 Exposure keratoconjunctivitis, bilateral: Secondary | ICD-10-CM | POA: Diagnosis not present

## 2020-12-13 ENCOUNTER — Encounter (HOSPITAL_COMMUNITY): Payer: Medicare Other | Admitting: Cardiology

## 2020-12-13 DIAGNOSIS — R0902 Hypoxemia: Secondary | ICD-10-CM | POA: Diagnosis not present

## 2020-12-24 ENCOUNTER — Other Ambulatory Visit (HOSPITAL_COMMUNITY): Payer: Self-pay | Admitting: Cardiology

## 2020-12-24 ENCOUNTER — Telehealth (HOSPITAL_COMMUNITY): Payer: Self-pay | Admitting: *Deleted

## 2020-12-24 NOTE — Telephone Encounter (Signed)
Left detailed vm °

## 2020-12-24 NOTE — Telephone Encounter (Signed)
What surgery is she having?   I am not familiar with the medical systems in [place], but can forward her records to whoever she sees there.

## 2020-12-24 NOTE — Telephone Encounter (Signed)
Pt left vm stating she is moving to [facility] 6/20 but is having surgery prior to that and needs to know which medications to hold. Also pt asked how does she go about finding a new provider in [place] and how can she get her records. Left detailed VM for pt to call me back.   Routed to [place] to see if he recommends a cardiologist is [place].

## 2020-12-25 NOTE — Telephone Encounter (Signed)
OK to hold Plavix 5 days prior to eye surgery and start afterwards if that is what the ophthalmologist wants.  Generally, can continue Plavix with cataract operations but not sure what she is having done.

## 2020-12-25 NOTE — Telephone Encounter (Signed)
Pt aware.

## 2020-12-25 NOTE — Telephone Encounter (Signed)
Patient called and LVM on [facility] triage line and stated that she has not heard from cardiologist and she is having eye surgery next week (6/8) and wants to know what she should do about her Plavix and Aspirin. Attempted to call patient back at the number [phone], which she provided, but had to LVM.

## 2020-12-27 ENCOUNTER — Other Ambulatory Visit: Payer: Self-pay | Admitting: Family Medicine

## 2020-12-28 ENCOUNTER — Other Ambulatory Visit: Payer: Self-pay | Admitting: Family Medicine

## 2021-01-22 ENCOUNTER — Other Ambulatory Visit (HOSPITAL_COMMUNITY): Payer: Self-pay | Admitting: Cardiology

## 2021-02-12 DIAGNOSIS — R0902 Hypoxemia: Secondary | ICD-10-CM | POA: Diagnosis not present

## 2021-03-21 ENCOUNTER — Other Ambulatory Visit: Payer: Self-pay | Admitting: Family Medicine

## 2021-03-21 ENCOUNTER — Other Ambulatory Visit (HOSPITAL_COMMUNITY): Payer: Self-pay | Admitting: Cardiology

## 2021-04-01 ENCOUNTER — Telehealth: Payer: Self-pay

## 2021-04-01 NOTE — Telephone Encounter (Signed)
Patient has moved to [place].  04-01-21 per Mickel Fuchs  VB

## 2021-04-10 ENCOUNTER — Telehealth (HOSPITAL_COMMUNITY): Payer: Self-pay

## 2021-04-10 NOTE — Telephone Encounter (Signed)
Received a fax requesting medical records from [facility]. Records were successfully faxed to: [phone] ,which was the number provided.. Medical request form will be scanned into patients chart.

## 2021-04-29 ENCOUNTER — Telehealth: Payer: Self-pay

## 2021-04-29 NOTE — Chronic Care Management (AMB) (Signed)
Chronic Care Management Pharmacy Assistant   Name: Kelli Velasquez  MRN: [id number] DOB: 1941-02-16   Reason for Encounter:Diabetes  Disease State   Recent office visits:  None since last CCM contact  Recent consult visits:  12/05/20-Ophthalmology- No data found   Hospital visits:  None in previous 6 months  Medications: Outpatient Encounter Medications as of 04/29/2021  Medication Sig   acetaminophen (TYLENOL) 325 MG tablet Take 650 mg by mouth every 8 (eight) hours as needed for mild pain. Take as needed per bottle   albuterol (PROAIR HFA) 108 (90 Base) MCG/ACT inhaler INHALE 2 PUFFS BY MOUTH EVERY 4 HOURS AS NEEDED FOR WHEEZING OR FOR SHORTNESS OF BREATH   aspirin 81 MG tablet Take 81 mg by mouth daily.   bisoprolol (ZEBETA) 5 MG tablet Take 1.5 tablets (7.5 mg total) by mouth daily. Please call office for appointment for further refills   chlorpheniramine (CHLOR-TRIMETON) 4 MG tablet Take 4 mg by mouth 2 (two) times daily as needed for allergies.   clopidogrel (PLAVIX) 75 MG tablet TAKE 1 TABLET BY MOUTH EVERY DAY   clotrimazole-betamethasone (LOTRISONE) cream APPLY 1 APPLICATION TOPICALLY 2 (TWO) TIMES DAILY AS NEEDED. TO AFFECTED AREA/RASH   cyanocobalamin (,VITAMIN B-12,) 1000 MCG/ML injection Inject 1,000 mcg into the muscle every 3 (three) months.   EPINEPHrine (EPIPEN 2-PAK) 0.3 mg/0.3 mL IJ SOAJ injection INJECT 0.3 MLS (0.3 MG TOTAL) INTO THE MUSCLE ONCE AS NEEDED FOR ALLERGIC REACTION   erythromycin ophthalmic ointment    esomeprazole (NEXIUM) 40 MG capsule TAKE 1 CAPSULE BY MOUTH EVERY DAY   ezetimibe (ZETIA) 10 MG tablet TAKE 1 TABLET BY MOUTH EVERY DAY   famotidine (PEPCID) 20 MG tablet Take 20 mg by mouth daily.   furosemide (LASIX) 20 MG tablet TAKE 1 TABLET BY MOUTH EVERY OTHER DAY   methocarbamol (ROBAXIN) 500 MG tablet Take 1 tablet (500 mg total) by mouth every 8 (eight) hours as needed for muscle spasms.   mometasone (ELOCON) 0.1 % cream APPLY TO AFFECTED  AREA EVERY DAY   OXYGEN 24/7 3l [employer]   REPATHA SURECLICK 250 MG/ML SOAJ INJECT 1 DOSE INTO THE SKIN EVERY 14 (FOURTEEN) DAYS.   sacubitril-valsartan (ENTRESTO) 97-103 MG Take 1 tablet by mouth 2 (two) times daily. Please call for office visit [phone]   No facility-administered encounter medications on file as of 04/29/2021.     Recent Relevant Labs: Lab Results  Component Value Date/Time   HGBA1C 5.8 10/30/2020 09:12 AM   HGBA1C 5.5 07/10/2019 09:29 AM    Kidney Function Lab Results  Component Value Date/Time   CREATININE 1.20 10/30/2020 09:12 AM   CREATININE 1.09 (H) 05/28/2020 03:44 PM   CREATININE 1.01 (H) 12/16/2015 11:42 AM   CREATININE 0.79 11/18/2015 11:06 AM   GFR 42.69 (L) 10/30/2020 09:12 AM   GFRNONAA 51 (L) 05/28/2020 03:44 PM   GFRAA 41 (L) 01/26/2020 11:47 AM     Contacted patient on 04/30/21 to discuss diabetes disease state.   Current antihyperglycemic regimen:  No pharmacotherapy   Patient verbally confirms she is taking the above medications as directed. Yes  no pharmacotherapy at this time   What diet changes have been made to improve diabetes control?  What recent interventions/DTPs have been made to improve glycemic control: 11/06/20  Encouraged low glycemic diet    Have there been any recent hospitalizations or ED visits since last visit with CPP? No  Patient denies hypoglycemic symptoms, including Pale, Sweaty, Shaky, Hungry, Nervous/irritable, and Vision  changes  Patient denies hyperglycemic symptoms, including blurry vision, excessive thirst, fatigue, polyuria, and weakness  How often are you checking your blood sugar?  About once a week now   What are your blood sugars ranging?  Fasting: 120's  130's  During the week, how often does your blood glucose drop below 70? Never  Are you checking your feet daily/regularly? Yes  Adherence Review: Is the patient currently on a STATIN medication? No Is the patient currently on ACE/ARB  medication? Yes Does the patient have >5 day gap between last estimated fill dates? No  Care Gaps: Annual wellness visit in last year? Yes  11/06/20 Most recent A1C reading:  5.8  10/30/20 Most Recent BP reading:  126/68  72-P   Star Rating Drugs:  Medication:  Last Fill: Day Supply Entresto 97-103 mg 04/26/21 30   No appointments scheduled within the next 30 days.   Debbora Dus, CPP notified  [name], [place] Assistant [phone]  Total time spent for month CPA: 50 min

## 2021-08-01 ENCOUNTER — Other Ambulatory Visit: Payer: Self-pay | Admitting: Internal Medicine

## 2021-08-01 DIAGNOSIS — E785 Hyperlipidemia, unspecified: Secondary | ICD-10-CM

## 2021-08-25 ENCOUNTER — Telehealth (HOSPITAL_COMMUNITY): Payer: Self-pay

## 2021-08-25 NOTE — Telephone Encounter (Signed)
Received a fax requesting medical records from [place], Judeth Porch.. Records were successfully faxed to: [phone] ,which was the number provided.. Medical request form will be scanned into patients chart.

## 2021-09-24 ENCOUNTER — Encounter: Payer: Self-pay | Admitting: Gastroenterology

## 2021-10-31 ENCOUNTER — Ambulatory Visit: Payer: Medicare Other

## 2022-10-22 ENCOUNTER — Telehealth: Payer: Self-pay | Admitting: Family Medicine

## 2022-10-22 NOTE — Telephone Encounter (Signed)
Contacted Kelli Velasquez to schedule their annual wellness visit. Patient declined to schedule AWV at this time. [place] care [place] of state.  [place] Direct Dial: [phone]

## 2024-06-30 ENCOUNTER — Telehealth (HOSPITAL_COMMUNITY): Payer: Self-pay

## 2024-06-30 NOTE — Telephone Encounter (Signed)
 error

## 2024-07-03 ENCOUNTER — Telehealth (HOSPITAL_COMMUNITY): Payer: Self-pay

## 2024-07-03 NOTE — Telephone Encounter (Signed)
 LATE DOCUMENTATION FAXED ON 06/30/24  Received a fax requesting medical records from [facility]. Records were successfully faxed to: [phone] ,which was the number provided.. Medical request form will be scanned into patients chart.   Phone Number: [phone]
